# Patient Record
Sex: Female | Born: 1945 | Race: White | Hispanic: No | Smoking: Former smoker
Health system: Southern US, Community
[De-identification: ages and names within clinical notes are randomized; demographics above are authoritative.]

## PROBLEM LIST (undated history)

## (undated) DIAGNOSIS — I313 Pericardial effusion (noninflammatory): Secondary | ICD-10-CM

## (undated) DIAGNOSIS — D649 Anemia, unspecified: Secondary | ICD-10-CM

## (undated) DIAGNOSIS — N183 Chronic kidney disease, stage 3 unspecified: Secondary | ICD-10-CM

## (undated) DIAGNOSIS — I2781 Cor pulmonale (chronic): Secondary | ICD-10-CM

## (undated) DIAGNOSIS — F419 Anxiety disorder, unspecified: Secondary | ICD-10-CM

## (undated) DIAGNOSIS — I2510 Atherosclerotic heart disease of native coronary artery without angina pectoris: Secondary | ICD-10-CM

## (undated) DIAGNOSIS — T8859XA Other complications of anesthesia, initial encounter: Secondary | ICD-10-CM

## (undated) DIAGNOSIS — I5032 Chronic diastolic (congestive) heart failure: Secondary | ICD-10-CM

## (undated) DIAGNOSIS — N390 Urinary tract infection, site not specified: Secondary | ICD-10-CM

## (undated) DIAGNOSIS — Z889 Allergy status to unspecified drugs, medicaments and biological substances status: Secondary | ICD-10-CM

## (undated) DIAGNOSIS — T4145XA Adverse effect of unspecified anesthetic, initial encounter: Secondary | ICD-10-CM

## (undated) DIAGNOSIS — F329 Major depressive disorder, single episode, unspecified: Secondary | ICD-10-CM

## (undated) DIAGNOSIS — E7800 Pure hypercholesterolemia, unspecified: Secondary | ICD-10-CM

## (undated) DIAGNOSIS — F32A Depression, unspecified: Secondary | ICD-10-CM

## (undated) DIAGNOSIS — I272 Pulmonary hypertension, unspecified: Secondary | ICD-10-CM

## (undated) DIAGNOSIS — C3490 Malignant neoplasm of unspecified part of unspecified bronchus or lung: Secondary | ICD-10-CM

## (undated) DIAGNOSIS — I10 Essential (primary) hypertension: Secondary | ICD-10-CM

## (undated) DIAGNOSIS — T148XXA Other injury of unspecified body region, initial encounter: Secondary | ICD-10-CM

## (undated) DIAGNOSIS — Z9981 Dependence on supplemental oxygen: Secondary | ICD-10-CM

## (undated) DIAGNOSIS — J189 Pneumonia, unspecified organism: Secondary | ICD-10-CM

## (undated) DIAGNOSIS — J9610 Chronic respiratory failure, unspecified whether with hypoxia or hypercapnia: Secondary | ICD-10-CM

## (undated) DIAGNOSIS — I3139 Other pericardial effusion (noninflammatory): Secondary | ICD-10-CM

## (undated) DIAGNOSIS — M1990 Unspecified osteoarthritis, unspecified site: Secondary | ICD-10-CM

## (undated) DIAGNOSIS — J449 Chronic obstructive pulmonary disease, unspecified: Secondary | ICD-10-CM

## (undated) HISTORY — PX: CARDIAC CATHETERIZATION: SHX172

## (undated) HISTORY — DX: Chronic respiratory failure, unspecified whether with hypoxia or hypercapnia: J96.10

## (undated) HISTORY — DX: Other pericardial effusion (noninflammatory): I31.39

## (undated) HISTORY — DX: Chronic obstructive pulmonary disease, unspecified: J44.9

## (undated) HISTORY — PX: EYE SURGERY: SHX253

## (undated) HISTORY — PX: TONSILLECTOMY: SUR1361

## (undated) HISTORY — DX: Cor pulmonale (chronic): I27.81

## (undated) HISTORY — DX: Chronic kidney disease, stage 3 (moderate): N18.3

## (undated) HISTORY — DX: Pericardial effusion (noninflammatory): I31.3

## (undated) HISTORY — DX: Chronic diastolic (congestive) heart failure: I50.32

## (undated) HISTORY — DX: Pure hypercholesterolemia, unspecified: E78.00

## (undated) HISTORY — DX: Chronic kidney disease, stage 3 unspecified: N18.30

---
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
## 2006-11-06 ENCOUNTER — Inpatient Hospital Stay (HOSPITAL_COMMUNITY): Admission: EM | Admit: 2006-11-06 | Discharge: 2006-11-16 | Payer: Self-pay | Admitting: Emergency Medicine

## 2006-11-06 ENCOUNTER — Ambulatory Visit: Payer: Self-pay | Admitting: Emergency Medicine

## 2006-11-10 ENCOUNTER — Encounter (INDEPENDENT_AMBULATORY_CARE_PROVIDER_SITE_OTHER): Payer: Self-pay | Admitting: Specialist

## 2006-11-11 ENCOUNTER — Ambulatory Visit: Payer: Self-pay | Admitting: Cardiology

## 2006-11-11 ENCOUNTER — Encounter: Payer: Self-pay | Admitting: Cardiology

## 2008-07-05 ENCOUNTER — Ambulatory Visit: Payer: Self-pay | Admitting: Thoracic Surgery

## 2008-07-11 ENCOUNTER — Ambulatory Visit: Admission: RE | Admit: 2008-07-11 | Discharge: 2008-07-11 | Payer: Self-pay | Admitting: Thoracic Surgery

## 2008-07-12 ENCOUNTER — Ambulatory Visit: Payer: Self-pay | Admitting: Thoracic Surgery

## 2008-07-14 HISTORY — PX: THORACOTOMY: SUR1349

## 2008-07-20 ENCOUNTER — Ambulatory Visit: Payer: Self-pay | Admitting: Thoracic Surgery

## 2008-07-20 ENCOUNTER — Inpatient Hospital Stay (HOSPITAL_COMMUNITY): Admission: RE | Admit: 2008-07-20 | Discharge: 2008-07-24 | Payer: Self-pay | Admitting: Thoracic Surgery

## 2008-07-20 ENCOUNTER — Encounter: Payer: Self-pay | Admitting: Thoracic Surgery

## 2008-08-02 ENCOUNTER — Ambulatory Visit: Payer: Self-pay | Admitting: Thoracic Surgery

## 2008-08-02 ENCOUNTER — Encounter: Admission: RE | Admit: 2008-08-02 | Discharge: 2008-08-02 | Payer: Self-pay | Admitting: Thoracic Surgery

## 2008-08-03 ENCOUNTER — Ambulatory Visit: Payer: Self-pay | Admitting: Internal Medicine

## 2008-08-09 ENCOUNTER — Encounter: Admission: RE | Admit: 2008-08-09 | Discharge: 2008-08-09 | Payer: Self-pay | Admitting: Endocrinology

## 2008-08-15 ENCOUNTER — Ambulatory Visit: Payer: Self-pay | Admitting: Thoracic Surgery

## 2008-08-15 ENCOUNTER — Encounter: Admission: RE | Admit: 2008-08-15 | Discharge: 2008-08-15 | Payer: Self-pay | Admitting: Thoracic Surgery

## 2008-08-15 LAB — CBC WITH DIFFERENTIAL/PLATELET
BASO%: 0.3 % (ref 0.0–2.0)
EOS%: 3 % (ref 0.0–7.0)
LYMPH%: 19.7 % (ref 14.0–48.0)
MCH: 29.1 pg (ref 26.0–34.0)
MCHC: 34 g/dL (ref 32.0–36.0)
MONO#: 0.7 10*3/uL (ref 0.1–0.9)
RBC: 4.72 10*6/uL (ref 3.70–5.32)
WBC: 7.5 10*3/uL (ref 3.9–10.0)
lymph#: 1.5 10*3/uL (ref 0.9–3.3)

## 2008-08-15 LAB — COMPREHENSIVE METABOLIC PANEL
ALT: 11 U/L (ref 0–35)
AST: 12 U/L (ref 0–37)
CO2: 25 mEq/L (ref 19–32)
Creatinine, Ser: 0.63 mg/dL (ref 0.40–1.20)
Sodium: 141 mEq/L (ref 135–145)
Total Bilirubin: 0.3 mg/dL (ref 0.3–1.2)
Total Protein: 7.5 g/dL (ref 6.0–8.3)

## 2008-08-21 LAB — CBC WITH DIFFERENTIAL/PLATELET
BASO%: 0.3 % (ref 0.0–2.0)
EOS%: 3.1 % (ref 0.0–7.0)
HCT: 38.6 % (ref 34.8–46.6)
LYMPH%: 16.9 % (ref 14.0–48.0)
MCH: 29 pg (ref 26.0–34.0)
MCHC: 34.1 g/dL (ref 32.0–36.0)
MCV: 84.8 fL (ref 81.0–101.0)
MONO#: 0.4 10*3/uL (ref 0.1–0.9)
MONO%: 4.4 % (ref 0.0–13.0)
NEUT%: 75.3 % (ref 39.6–76.8)
Platelets: 234 10*3/uL (ref 145–400)
RBC: 4.55 10*6/uL (ref 3.70–5.32)
WBC: 8.2 10*3/uL (ref 3.9–10.0)

## 2008-08-21 LAB — COMPREHENSIVE METABOLIC PANEL
ALT: 15 U/L (ref 0–35)
AST: 13 U/L (ref 0–37)
Alkaline Phosphatase: 97 U/L (ref 39–117)
CO2: 22 mEq/L (ref 19–32)
Creatinine, Ser: 0.64 mg/dL (ref 0.40–1.20)
Sodium: 139 mEq/L (ref 135–145)
Total Bilirubin: 0.3 mg/dL (ref 0.3–1.2)
Total Protein: 7.3 g/dL (ref 6.0–8.3)

## 2008-08-28 LAB — COMPREHENSIVE METABOLIC PANEL
ALT: 23 U/L (ref 0–35)
AST: 22 U/L (ref 0–37)
Albumin: 3.9 g/dL (ref 3.5–5.2)
CO2: 27 mEq/L (ref 19–32)
Calcium: 9.6 mg/dL (ref 8.4–10.5)
Chloride: 102 mEq/L (ref 96–112)
Potassium: 3.6 mEq/L (ref 3.5–5.3)
Sodium: 140 mEq/L (ref 135–145)
Total Protein: 7.1 g/dL (ref 6.0–8.3)

## 2008-08-28 LAB — CBC WITH DIFFERENTIAL/PLATELET
BASO%: 0.2 % (ref 0.0–2.0)
HCT: 37 % (ref 34.8–46.6)
LYMPH%: 15 % (ref 14.0–48.0)
MCHC: 34 g/dL (ref 32.0–36.0)
MCV: 85.4 fL (ref 81.0–101.0)
MONO#: 0.2 10*3/uL (ref 0.1–0.9)
MONO%: 1.8 % (ref 0.0–13.0)
NEUT%: 81.4 % — ABNORMAL HIGH (ref 39.6–76.8)
Platelets: 237 10*3/uL (ref 145–400)
RBC: 4.34 10*6/uL (ref 3.70–5.32)
WBC: 10.3 10*3/uL — ABNORMAL HIGH (ref 3.9–10.0)

## 2008-09-04 LAB — CBC WITH DIFFERENTIAL/PLATELET
BASO%: 0.1 % (ref 0.0–2.0)
EOS%: 0.5 % (ref 0.0–7.0)
MCH: 29 pg (ref 25.1–34.0)
MCHC: 34.5 g/dL (ref 31.5–36.0)
MONO#: 0.4 10*3/uL (ref 0.1–0.9)
NEUT%: 84.4 % — ABNORMAL HIGH (ref 38.4–76.8)
RBC: 4.14 10*6/uL (ref 3.70–5.45)
WBC: 18.8 10*3/uL — ABNORMAL HIGH (ref 3.9–10.3)
lymph#: 2.4 10*3/uL (ref 0.9–3.3)

## 2008-09-04 LAB — COMPREHENSIVE METABOLIC PANEL
ALT: 17 U/L (ref 0–35)
AST: 16 U/L (ref 0–37)
CO2: 24 mEq/L (ref 19–32)
Chloride: 104 mEq/L (ref 96–112)
Creatinine, Ser: 0.62 mg/dL (ref 0.40–1.20)
Sodium: 141 mEq/L (ref 135–145)
Total Bilirubin: 0.2 mg/dL — ABNORMAL LOW (ref 0.3–1.2)
Total Protein: 6.9 g/dL (ref 6.0–8.3)

## 2008-09-11 LAB — CBC WITH DIFFERENTIAL/PLATELET
BASO%: 0.2 % (ref 0.0–2.0)
EOS%: 0.2 % (ref 0.0–7.0)
MCH: 27.9 pg (ref 25.1–34.0)
MCHC: 33.6 g/dL (ref 31.5–36.0)
MONO#: 0.9 10*3/uL (ref 0.1–0.9)
RDW: 14.8 % — ABNORMAL HIGH (ref 11.2–14.5)
WBC: 15 10*3/uL — ABNORMAL HIGH (ref 3.9–10.3)
lymph#: 1.7 10*3/uL (ref 0.9–3.3)
nRBC: 0 % (ref 0–0)

## 2008-09-11 LAB — COMPREHENSIVE METABOLIC PANEL
ALT: 16 U/L (ref 0–35)
AST: 15 U/L (ref 0–37)
Albumin: 4.6 g/dL (ref 3.5–5.2)
Alkaline Phosphatase: 120 U/L — ABNORMAL HIGH (ref 39–117)
BUN: 23 mg/dL (ref 6–23)
Chloride: 105 mEq/L (ref 96–112)
Potassium: 4.1 mEq/L (ref 3.5–5.3)
Sodium: 141 mEq/L (ref 135–145)
Total Protein: 7.2 g/dL (ref 6.0–8.3)

## 2008-09-18 ENCOUNTER — Ambulatory Visit: Payer: Self-pay | Admitting: Internal Medicine

## 2008-09-18 LAB — CBC WITH DIFFERENTIAL/PLATELET
BASO%: 0.4 % (ref 0.0–2.0)
Basophils Absolute: 0.1 10*3/uL (ref 0.0–0.1)
EOS%: 0.2 % (ref 0.0–7.0)
HGB: 11.2 g/dL — ABNORMAL LOW (ref 11.6–15.9)
MCH: 28.1 pg (ref 25.1–34.0)
MCHC: 33.6 g/dL (ref 31.5–36.0)
MCV: 83.5 fL (ref 79.5–101.0)
MONO%: 1.9 % (ref 0.0–14.0)
RDW: 14.9 % — ABNORMAL HIGH (ref 11.2–14.5)

## 2008-09-18 LAB — COMPREHENSIVE METABOLIC PANEL
Alkaline Phosphatase: 136 U/L — ABNORMAL HIGH (ref 39–117)
BUN: 19 mg/dL (ref 6–23)
Glucose, Bld: 140 mg/dL — ABNORMAL HIGH (ref 70–99)
Sodium: 139 mEq/L (ref 135–145)
Total Bilirubin: 0.3 mg/dL (ref 0.3–1.2)
Total Protein: 6.5 g/dL (ref 6.0–8.3)

## 2008-09-25 LAB — COMPREHENSIVE METABOLIC PANEL
CO2: 25 mEq/L (ref 19–32)
Creatinine, Ser: 0.53 mg/dL (ref 0.40–1.20)
Glucose, Bld: 117 mg/dL — ABNORMAL HIGH (ref 70–99)
Total Bilirubin: 0.3 mg/dL (ref 0.3–1.2)

## 2008-09-25 LAB — CBC WITH DIFFERENTIAL/PLATELET
Basophils Absolute: 0.2 10*3/uL — ABNORMAL HIGH (ref 0.0–0.1)
Eosinophils Absolute: 0.1 10*3/uL (ref 0.0–0.5)
HGB: 11.5 g/dL — ABNORMAL LOW (ref 11.6–15.9)
LYMPH%: 13.4 % — ABNORMAL LOW (ref 14.0–49.7)
MCV: 83.4 fL (ref 79.5–101.0)
MONO%: 7.5 % (ref 0.0–14.0)
NEUT#: 18.2 10*3/uL — ABNORMAL HIGH (ref 1.5–6.5)
Platelets: 97 10*3/uL — ABNORMAL LOW (ref 145–400)

## 2008-09-27 ENCOUNTER — Ambulatory Visit: Payer: Self-pay | Admitting: Thoracic Surgery

## 2008-09-27 ENCOUNTER — Encounter: Admission: RE | Admit: 2008-09-27 | Discharge: 2008-09-27 | Payer: Self-pay | Admitting: Thoracic Surgery

## 2008-10-02 LAB — COMPREHENSIVE METABOLIC PANEL
Albumin: 4.2 g/dL (ref 3.5–5.2)
CO2: 25 mEq/L (ref 19–32)
Calcium: 9.5 mg/dL (ref 8.4–10.5)
Glucose, Bld: 156 mg/dL — ABNORMAL HIGH (ref 70–99)
Sodium: 141 mEq/L (ref 135–145)
Total Bilirubin: 0.3 mg/dL (ref 0.3–1.2)
Total Protein: 6.9 g/dL (ref 6.0–8.3)

## 2008-10-02 LAB — CBC WITH DIFFERENTIAL/PLATELET
Eosinophils Absolute: 0.1 10*3/uL (ref 0.0–0.5)
HCT: 33 % — ABNORMAL LOW (ref 34.8–46.6)
LYMPH%: 13.9 % — ABNORMAL LOW (ref 14.0–49.7)
MONO#: 0.7 10*3/uL (ref 0.1–0.9)
NEUT#: 10.3 10*3/uL — ABNORMAL HIGH (ref 1.5–6.5)
Platelets: 298 10*3/uL (ref 145–400)
RBC: 3.88 10*6/uL (ref 3.70–5.45)
WBC: 13 10*3/uL — ABNORMAL HIGH (ref 3.9–10.3)

## 2008-10-09 LAB — COMPREHENSIVE METABOLIC PANEL
ALT: 15 U/L (ref 0–35)
AST: 13 U/L (ref 0–37)
CO2: 28 mEq/L (ref 19–32)
Calcium: 9.6 mg/dL (ref 8.4–10.5)
Chloride: 101 mEq/L (ref 96–112)
Potassium: 4.1 mEq/L (ref 3.5–5.3)
Sodium: 139 mEq/L (ref 135–145)
Total Protein: 6.6 g/dL (ref 6.0–8.3)

## 2008-10-09 LAB — CBC WITH DIFFERENTIAL/PLATELET
Eosinophils Absolute: 0.1 10*3/uL (ref 0.0–0.5)
MONO#: 0.3 10*3/uL (ref 0.1–0.9)
NEUT#: 22.4 10*3/uL — ABNORMAL HIGH (ref 1.5–6.5)
Platelets: 285 10*3/uL (ref 145–400)
RBC: 3.52 10*6/uL — ABNORMAL LOW (ref 3.70–5.45)
RDW: 17 % — ABNORMAL HIGH (ref 11.2–14.5)
WBC: 24.6 10*3/uL — ABNORMAL HIGH (ref 3.9–10.3)
lymph#: 1.9 10*3/uL (ref 0.9–3.3)
nRBC: 0 % (ref 0–0)

## 2008-10-17 LAB — COMPREHENSIVE METABOLIC PANEL
AST: 18 U/L (ref 0–37)
Albumin: 4.2 g/dL (ref 3.5–5.2)
BUN: 12 mg/dL (ref 6–23)
Calcium: 9.2 mg/dL (ref 8.4–10.5)
Chloride: 105 mEq/L (ref 96–112)
Creatinine, Ser: 0.58 mg/dL (ref 0.40–1.20)
Glucose, Bld: 88 mg/dL (ref 70–99)

## 2008-10-17 LAB — CBC WITH DIFFERENTIAL/PLATELET
Basophils Absolute: 0 10*3/uL (ref 0.0–0.1)
EOS%: 0.5 % (ref 0.0–7.0)
Eosinophils Absolute: 0.1 10*3/uL (ref 0.0–0.5)
HCT: 29.3 % — ABNORMAL LOW (ref 34.8–46.6)
HGB: 10.1 g/dL — ABNORMAL LOW (ref 11.6–15.9)
MCH: 30 pg (ref 25.1–34.0)
MCV: 87.2 fL (ref 79.5–101.0)
MONO%: 3.9 % (ref 0.0–14.0)
NEUT#: 12.1 10*3/uL — ABNORMAL HIGH (ref 1.5–6.5)
NEUT%: 81.5 % — ABNORMAL HIGH (ref 38.4–76.8)
lymph#: 2 10*3/uL (ref 0.9–3.3)

## 2008-10-23 LAB — COMPREHENSIVE METABOLIC PANEL
Albumin: 4.1 g/dL (ref 3.5–5.2)
BUN: 19 mg/dL (ref 6–23)
CO2: 24 mEq/L (ref 19–32)
Calcium: 10 mg/dL (ref 8.4–10.5)
Chloride: 103 mEq/L (ref 96–112)
Creatinine, Ser: 0.52 mg/dL (ref 0.40–1.20)
Glucose, Bld: 91 mg/dL (ref 70–99)
Potassium: 4.1 mEq/L (ref 3.5–5.3)

## 2008-10-23 LAB — CBC WITH DIFFERENTIAL/PLATELET
Basophils Absolute: 0 10*3/uL (ref 0.0–0.1)
Eosinophils Absolute: 0.1 10*3/uL (ref 0.0–0.5)
HCT: 32.4 % — ABNORMAL LOW (ref 34.8–46.6)
HGB: 11.1 g/dL — ABNORMAL LOW (ref 11.6–15.9)
MCH: 29.4 pg (ref 25.1–34.0)
MONO#: 0.8 10*3/uL (ref 0.1–0.9)
NEUT#: 8.2 10*3/uL — ABNORMAL HIGH (ref 1.5–6.5)
NEUT%: 76.5 % (ref 38.4–76.8)
RDW: 19.5 % — ABNORMAL HIGH (ref 11.2–14.5)
WBC: 10.7 10*3/uL — ABNORMAL HIGH (ref 3.9–10.3)
lymph#: 1.6 10*3/uL (ref 0.9–3.3)

## 2008-10-26 ENCOUNTER — Ambulatory Visit: Payer: Self-pay | Admitting: Internal Medicine

## 2008-10-30 LAB — COMPREHENSIVE METABOLIC PANEL
ALT: 13 U/L (ref 0–35)
AST: 13 U/L (ref 0–37)
Albumin: 4.3 g/dL (ref 3.5–5.2)
BUN: 17 mg/dL (ref 6–23)
CO2: 26 mEq/L (ref 19–32)
Calcium: 9.7 mg/dL (ref 8.4–10.5)
Chloride: 103 mEq/L (ref 96–112)
Creatinine, Ser: 0.59 mg/dL (ref 0.40–1.20)
Potassium: 3.9 mEq/L (ref 3.5–5.3)

## 2008-10-30 LAB — CBC WITH DIFFERENTIAL/PLATELET
BASO%: 0 % (ref 0.0–2.0)
Basophils Absolute: 0 10*3/uL (ref 0.0–0.1)
EOS%: 0.3 % (ref 0.0–7.0)
HCT: 30.8 % — ABNORMAL LOW (ref 34.8–46.6)
HGB: 10.5 g/dL — ABNORMAL LOW (ref 11.6–15.9)
MCH: 29.9 pg (ref 25.1–34.0)
MONO#: 0.2 10*3/uL (ref 0.1–0.9)
NEUT%: 91.4 % — ABNORMAL HIGH (ref 38.4–76.8)
RDW: 20.5 % — ABNORMAL HIGH (ref 11.2–14.5)
WBC: 19.5 10*3/uL — ABNORMAL HIGH (ref 3.9–10.3)
lymph#: 1.5 10*3/uL (ref 0.9–3.3)

## 2008-11-08 ENCOUNTER — Ambulatory Visit (HOSPITAL_COMMUNITY): Admission: RE | Admit: 2008-11-08 | Discharge: 2008-11-08 | Payer: Self-pay | Admitting: Internal Medicine

## 2008-11-22 ENCOUNTER — Ambulatory Visit: Payer: Self-pay | Admitting: Thoracic Surgery

## 2009-02-06 ENCOUNTER — Ambulatory Visit: Payer: Self-pay | Admitting: Internal Medicine

## 2009-02-09 ENCOUNTER — Ambulatory Visit (HOSPITAL_COMMUNITY): Admission: RE | Admit: 2009-02-09 | Discharge: 2009-02-09 | Payer: Self-pay | Admitting: Internal Medicine

## 2009-02-09 LAB — COMPREHENSIVE METABOLIC PANEL
AST: 46 U/L — ABNORMAL HIGH (ref 0–37)
Alkaline Phosphatase: 119 U/L — ABNORMAL HIGH (ref 39–117)
BUN: 13 mg/dL (ref 6–23)
Creatinine, Ser: 0.61 mg/dL (ref 0.40–1.20)

## 2009-02-09 LAB — CBC WITH DIFFERENTIAL/PLATELET
Basophils Absolute: 0 10*3/uL (ref 0.0–0.1)
EOS%: 1.8 % (ref 0.0–7.0)
HCT: 40.3 % (ref 34.8–46.6)
HGB: 13.9 g/dL (ref 11.6–15.9)
LYMPH%: 20.6 % (ref 14.0–49.7)
MCH: 29.8 pg (ref 25.1–34.0)
MCV: 86.6 fL (ref 79.5–101.0)
MONO%: 7.6 % (ref 0.0–14.0)
NEUT%: 69.9 % (ref 38.4–76.8)
Platelets: 235 10*3/uL (ref 145–400)

## 2009-05-09 ENCOUNTER — Ambulatory Visit: Payer: Self-pay | Admitting: Internal Medicine

## 2009-05-14 ENCOUNTER — Ambulatory Visit (HOSPITAL_COMMUNITY): Admission: RE | Admit: 2009-05-14 | Discharge: 2009-05-14 | Payer: Self-pay | Admitting: Internal Medicine

## 2009-05-14 LAB — COMPREHENSIVE METABOLIC PANEL
AST: 29 U/L (ref 0–37)
Albumin: 3.8 g/dL (ref 3.5–5.2)
Alkaline Phosphatase: 107 U/L (ref 39–117)
BUN: 9 mg/dL (ref 6–23)
Creatinine, Ser: 0.59 mg/dL (ref 0.40–1.20)
Potassium: 4.1 mEq/L (ref 3.5–5.3)

## 2009-05-14 LAB — CBC WITH DIFFERENTIAL/PLATELET
Basophils Absolute: 0 10*3/uL (ref 0.0–0.1)
EOS%: 2.3 % (ref 0.0–7.0)
HGB: 14 g/dL (ref 11.6–15.9)
MCH: 30.3 pg (ref 25.1–34.0)
MCV: 88.4 fL (ref 79.5–101.0)
MONO%: 8.8 % (ref 0.0–14.0)
RBC: 4.63 10*6/uL (ref 3.70–5.45)
RDW: 14.7 % — ABNORMAL HIGH (ref 11.2–14.5)

## 2009-05-23 ENCOUNTER — Ambulatory Visit: Payer: Self-pay | Admitting: Thoracic Surgery

## 2009-07-16 ENCOUNTER — Other Ambulatory Visit: Admission: RE | Admit: 2009-07-16 | Discharge: 2009-07-16 | Payer: Self-pay | Admitting: Family Medicine

## 2009-08-10 ENCOUNTER — Ambulatory Visit: Payer: Self-pay | Admitting: Internal Medicine

## 2009-08-14 ENCOUNTER — Ambulatory Visit (HOSPITAL_COMMUNITY): Admission: RE | Admit: 2009-08-14 | Discharge: 2009-08-14 | Payer: Self-pay | Admitting: Internal Medicine

## 2009-08-14 LAB — CBC WITH DIFFERENTIAL/PLATELET
BASO%: 0.4 % (ref 0.0–2.0)
Basophils Absolute: 0 10*3/uL (ref 0.0–0.1)
EOS%: 2 % (ref 0.0–7.0)
MCH: 30.4 pg (ref 25.1–34.0)
MCHC: 34.5 g/dL (ref 31.5–36.0)
MCV: 88 fL (ref 79.5–101.0)
MONO%: 8.5 % (ref 0.0–14.0)
RBC: 4.58 10*6/uL (ref 3.70–5.45)
RDW: 14.6 % — ABNORMAL HIGH (ref 11.2–14.5)

## 2009-08-14 LAB — COMPREHENSIVE METABOLIC PANEL
ALT: 31 U/L (ref 0–35)
AST: 26 U/L (ref 0–37)
Albumin: 3.8 g/dL (ref 3.5–5.2)
Alkaline Phosphatase: 110 U/L (ref 39–117)
BUN: 16 mg/dL (ref 6–23)
Potassium: 4.1 mEq/L (ref 3.5–5.3)

## 2009-08-17 ENCOUNTER — Ambulatory Visit: Payer: Self-pay | Admitting: Thoracic Surgery

## 2009-11-09 ENCOUNTER — Ambulatory Visit (HOSPITAL_COMMUNITY): Admission: RE | Admit: 2009-11-09 | Discharge: 2009-11-09 | Payer: Self-pay | Admitting: Internal Medicine

## 2009-11-09 ENCOUNTER — Ambulatory Visit: Payer: Self-pay | Admitting: Internal Medicine

## 2009-11-09 LAB — CBC WITH DIFFERENTIAL/PLATELET
EOS%: 1.2 % (ref 0.0–7.0)
MCH: 30.4 pg (ref 25.1–34.0)
MCV: 88.3 fL (ref 79.5–101.0)
MONO%: 7.7 % (ref 0.0–14.0)
NEUT#: 4.8 10*3/uL (ref 1.5–6.5)
RBC: 4.76 10*6/uL (ref 3.70–5.45)
RDW: 13.8 % (ref 11.2–14.5)
lymph#: 1.2 10*3/uL (ref 0.9–3.3)

## 2009-11-09 LAB — COMPREHENSIVE METABOLIC PANEL
ALT: 27 U/L (ref 0–35)
AST: 22 U/L (ref 0–37)
Albumin: 3.7 g/dL (ref 3.5–5.2)
Alkaline Phosphatase: 101 U/L (ref 39–117)
Calcium: 9.4 mg/dL (ref 8.4–10.5)
Chloride: 103 mEq/L (ref 96–112)
Potassium: 4.2 mEq/L (ref 3.5–5.3)
Sodium: 137 mEq/L (ref 135–145)
Total Protein: 7 g/dL (ref 6.0–8.3)

## 2009-11-16 ENCOUNTER — Ambulatory Visit: Payer: Self-pay | Admitting: Thoracic Surgery

## 2010-03-13 ENCOUNTER — Ambulatory Visit: Payer: Self-pay | Admitting: Internal Medicine

## 2010-03-15 ENCOUNTER — Ambulatory Visit (HOSPITAL_COMMUNITY): Admission: RE | Admit: 2010-03-15 | Discharge: 2010-03-15 | Payer: Self-pay | Admitting: Internal Medicine

## 2010-03-15 LAB — CBC WITH DIFFERENTIAL/PLATELET
Basophils Absolute: 0 10*3/uL (ref 0.0–0.1)
Eosinophils Absolute: 0.1 10*3/uL (ref 0.0–0.5)
HGB: 14.7 g/dL (ref 11.6–15.9)
MCV: 88.6 fL (ref 79.5–101.0)
MONO%: 10 % (ref 0.0–14.0)
NEUT#: 4.7 10*3/uL (ref 1.5–6.5)
RBC: 4.9 10*6/uL (ref 3.70–5.45)
RDW: 13.9 % (ref 11.2–14.5)
WBC: 6.8 10*3/uL (ref 3.9–10.3)
lymph#: 1.3 10*3/uL (ref 0.9–3.3)

## 2010-03-15 LAB — COMPREHENSIVE METABOLIC PANEL
AST: 41 U/L — ABNORMAL HIGH (ref 0–37)
Albumin: 3.9 g/dL (ref 3.5–5.2)
Alkaline Phosphatase: 108 U/L (ref 39–117)
BUN: 12 mg/dL (ref 6–23)
Calcium: 9.5 mg/dL (ref 8.4–10.5)
Chloride: 100 mEq/L (ref 96–112)
Glucose, Bld: 314 mg/dL — ABNORMAL HIGH (ref 70–99)
Potassium: 4.5 mEq/L (ref 3.5–5.3)
Sodium: 136 mEq/L (ref 135–145)
Total Protein: 7.3 g/dL (ref 6.0–8.3)

## 2010-07-05 ENCOUNTER — Ambulatory Visit: Payer: Self-pay | Admitting: Internal Medicine

## 2010-07-10 ENCOUNTER — Ambulatory Visit (HOSPITAL_COMMUNITY)
Admission: RE | Admit: 2010-07-10 | Discharge: 2010-07-10 | Payer: Self-pay | Source: Home / Self Care | Attending: Internal Medicine | Admitting: Internal Medicine

## 2010-07-10 LAB — CBC WITH DIFFERENTIAL/PLATELET
Basophils Absolute: 0 10*3/uL (ref 0.0–0.1)
EOS%: 3 % (ref 0.0–7.0)
HCT: 41.2 % (ref 34.8–46.6)
HGB: 14.3 g/dL (ref 11.6–15.9)
MCH: 31 pg (ref 25.1–34.0)
MCV: 89.2 fL (ref 79.5–101.0)
MONO%: 8.6 % (ref 0.0–14.0)
NEUT%: 64.3 % (ref 38.4–76.8)
RDW: 13.5 % (ref 11.2–14.5)

## 2010-07-10 LAB — CMP (CANCER CENTER ONLY)
AST: 22 U/L (ref 11–38)
Alkaline Phosphatase: 96 U/L — ABNORMAL HIGH (ref 26–84)
BUN, Bld: 15 mg/dL (ref 7–22)
Creat: 0.7 mg/dl (ref 0.6–1.2)

## 2010-08-03 ENCOUNTER — Other Ambulatory Visit: Payer: Self-pay | Admitting: Internal Medicine

## 2010-08-03 DIAGNOSIS — Z85118 Personal history of other malignant neoplasm of bronchus and lung: Secondary | ICD-10-CM

## 2010-08-05 ENCOUNTER — Encounter: Payer: Self-pay | Admitting: Thoracic Surgery

## 2010-10-28 LAB — BASIC METABOLIC PANEL
CO2: 27 mEq/L (ref 19–32)
CO2: 27 mEq/L (ref 19–32)
Calcium: 9.1 mg/dL (ref 8.4–10.5)
Chloride: 109 mEq/L (ref 96–112)
GFR calc Af Amer: >60 mL/min (ref >60)
GFR calc Af Amer: >60 mL/min (ref >60)
GFR calc non Af Amer: >60 mL/min (ref >60)
Glucose, Bld: 159 mg/dL — ABNORMAL HIGH (ref 70–99)
Potassium: 3.5 mEq/L (ref 3.5–5.1)
Sodium: 136 mEq/L (ref 135–145)
Sodium: 141 mEq/L (ref 135–145)

## 2010-10-28 LAB — URINALYSIS, ROUTINE W REFLEX MICROSCOPIC
Bilirubin Urine: NEGATIVE
Glucose, UA: NEGATIVE mg/dL
Hgb urine dipstick: NEGATIVE
Ketones, ur: 15 mg/dL — AB
Nitrite: NEGATIVE
pH: 6 (ref 5.0–8.0)

## 2010-10-28 LAB — COMPREHENSIVE METABOLIC PANEL
AST: 22 U/L (ref 0–37)
AST: 24 U/L (ref 0–37)
Alkaline Phosphatase: 95 U/L (ref 39–117)
BUN: 9 mg/dL (ref 6–23)
CO2: 22 mEq/L (ref 19–32)
CO2: 27 mEq/L (ref 19–32)
Chloride: 104 mEq/L (ref 96–112)
Chloride: 107 mEq/L (ref 96–112)
Creatinine, Ser: 0.54 mg/dL (ref 0.4–1.2)
Creatinine, Ser: 0.56 mg/dL (ref 0.4–1.2)
GFR calc Af Amer: >60 mL/min (ref >60)
GFR calc Af Amer: >60 mL/min (ref >60)
GFR calc non Af Amer: >60 mL/min (ref >60)
GFR calc non Af Amer: >60 mL/min (ref >60)
Glucose, Bld: 208 mg/dL — ABNORMAL HIGH (ref 70–99)
Total Bilirubin: 0.6 mg/dL (ref 0.3–1.2)
Total Bilirubin: 1.1 mg/dL (ref 0.3–1.2)

## 2010-10-28 LAB — GLUCOSE, CAPILLARY
Glucose-Capillary: 152 mg/dL — ABNORMAL HIGH (ref 70–99)
Glucose-Capillary: 171 mg/dL — ABNORMAL HIGH (ref 70–99)
Glucose-Capillary: 202 mg/dL — ABNORMAL HIGH (ref 70–99)
Glucose-Capillary: 204 mg/dL — ABNORMAL HIGH (ref 70–99)
Glucose-Capillary: 262 mg/dL — ABNORMAL HIGH (ref 70–99)

## 2010-10-28 LAB — CBC
HCT: 32.1 % — ABNORMAL LOW (ref 36.0–46.0)
HCT: 33.7 % — ABNORMAL LOW (ref 36.0–46.0)
HCT: 42.6 % (ref 36.0–46.0)
Hemoglobin: 10.7 g/dL — ABNORMAL LOW (ref 12.0–15.0)
Hemoglobin: 11.4 g/dL — ABNORMAL LOW (ref 12.0–15.0)
Hemoglobin: 11.5 g/dL — ABNORMAL LOW (ref 12.0–15.0)
MCHC: 33.9 g/dL (ref 30.0–36.0)
MCV: 86.9 fL (ref 78.0–100.0)
MCV: 87.6 fL (ref 78.0–100.0)
MCV: 88 fL (ref 78.0–100.0)
RBC: 3.64 MIL/uL — ABNORMAL LOW (ref 3.87–5.11)
RBC: 3.84 MIL/uL — ABNORMAL LOW (ref 3.87–5.11)
RBC: 3.89 MIL/uL (ref 3.87–5.11)
RBC: 4.9 MIL/uL (ref 3.87–5.11)
WBC: 7 10*3/uL (ref 4.0–10.5)
WBC: 9.4 10*3/uL (ref 4.0–10.5)

## 2010-10-28 LAB — TYPE AND SCREEN
ABO/RH(D): B NEG
Antibody Screen: NEGATIVE

## 2010-10-28 LAB — URINE MICROSCOPIC-ADD ON

## 2010-10-28 LAB — APTT: aPTT: 27 seconds (ref 24–37)

## 2010-10-28 LAB — POCT I-STAT 3, ART BLOOD GAS (G3+)
Bicarbonate: 27.8 mEq/L — ABNORMAL HIGH (ref 20.0–24.0)
TCO2: 29 mmol/L (ref 0–100)
pCO2 arterial: 48.1 mmHg — ABNORMAL HIGH (ref 35.0–45.0)
pH, Arterial: 7.368 (ref 7.350–7.400)

## 2010-10-28 LAB — BLOOD GAS, ARTERIAL
Acid-base deficit: 0.2 mmol/L (ref 0.0–2.0)
TCO2: 24.3 mmol/L (ref 0–100)
pCO2 arterial: 33.6 mmHg — ABNORMAL LOW (ref 35.0–45.0)
pO2, Arterial: 87.1 mmHg (ref 80.0–100.0)

## 2010-11-26 NOTE — Letter (Signed)
July 12, 2008   [name], MD  [address]  [place], [place] [address]   Re:  EMBER, HENRIKSON                  DOB:  1945/07/21   Dear Dr. Rosana Hoes:   I saw the patient back today after her CAT scan, brain scan, and  pulmonary function tests.  Her pulmonary function tests were  satisfactory with an FVC of 2.27 with an FEV-1 of 1.87 of 88% predicted  and DLCO of 100% with correction.  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  So, I think she is a good candidate for right lower lobe  superior  segmentectomy using the VATS approach.  We will schedule this for  July 26, 2007, at [facility].  We will let you know our findings.  I appreciate the opportunity of taking care of the patient.   Sincerely.   Nicanor Alcon, M.D.  Electronically Signed   DPB/MEDQ  D:  07/12/2008  T:  07/13/2008  Job:  [id number]

## 2010-11-26 NOTE — Op Note (Signed)
Amy Shepard, Amy Shepard          ACCOUNT NO.:  [id number]   MEDICAL RECORD NO.:  [id number]          PATIENT TYPE:  INP   LOCATION:  2303                         FACILITY:  [facility]   PHYSICIAN:  Nicanor Alcon, M.D. DATE OF BIRTH:  1946/02/20   DATE OF PROCEDURE:  07/20/2008  DATE OF DISCHARGE:                               OPERATIVE REPORT   PREOPERATIVE DIAGNOSIS:  Right lower lobe superior segmental lesion.   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   OPERATIONS PERFORMED:  1. Right video-assisted thoracic surgery, right lower lobe.  2. Superior segmentectomy with node dissection.   SURGEON:  Nicanor Alcon, MD   FIRST ASSISTANT:  John Giovanni, PA-C   After percutaneous insertion of all monitoring lines, the patient turned  to the right lateral thoracotomy position and was prepped and draped in  the usual sterile manner.  Trocar site was made in the anterior axillary  line at the eighth intercostal space and the trocar was inserted and a 0-  degree scope was inserted.  We could see the lesion in the superior  segment of the right lower lobe.  For this reason, a lateral small mini-  thoracotomy incision was made over the sixth intercostal space.  The  latissimus was partially divided.  The serratus was reflected  anteriorly.  The sixth intercostal space was entered.  Small TPA was  placed.  Dissection was started in the fissure dissecting out both the  superior segmental artery, and dissecting this out, there was some  bleeding from this artery such that we had to suture with 4-0 Prolene  proximally and distally.  The dissection was carried superiorly down to  the bronchus.  The bronchus was identified, and then we used a 60 green  Covidien stapler to come across the bronchus as well as resecting the  superior segment.  This was done with several applications.  The  superior segment was sent for frozen section and frozen section  revealed  that either is a small-cell cancer or a possible lymphoma, margins were  negative.  We dissected out some lymph nodes from around the bronchus  intermedius, some 10 and 11R lymph nodes and then posteriorly dissected  out a small 7 lymph node from the subcarinal space.  The fissure was  reapproximated with interrupted 3-0 Vicryls in an interrupted fashion  checking for an air leak and then putting CoSeal on that staple line and  the suture line.  A chest tube was brought anteriorly and sutured in  place with 0 silk.  A Marcaine block was done in the usual fashion.  A  single On-Q was inserted in the usual fashion.  Chest was closed with 3  pericostals drilling through the seventh rib and passed around the sixth  rib, #1 Vicryl in the muscle layer, 2-0 Vicryl in the subcutaneous  tissue, and Ethicon skin clips.  The patient was returned to the  recovery room in stable condition.      Nicanor Alcon, M.D.  Electronically Signed     DPB/MEDQ  D:  07/20/2008  T:  07/21/2008  Job:  [id number]

## 2010-11-26 NOTE — Letter (Signed)
Nov 16, 2009   Mohamed K. Amy Shepard, [place], [place] [address]   Re:  Amy Shepard, GRAYS                  DOB:  Sep 16, 1945   Dear Amy Shepard,   I saw the patient today, and her CT scan showed no evidence of  recurrence now over a year since her surgery.  She had another CT scan  scheduled in the future.  We will see her back again, and I will let you  follow her from now.  She is doing well from my standpoint.  Her blood  pressure is 128/80, pulse 96, respirations 18, sats were 92%.  She has  shortness of breath with exertion. I will let you follow her from now  on. I appreciate your care of this patient.   Nicanor Alcon, M.D.  Electronically Signed   DPB/MEDQ  D:  11/16/2009  T:  11/17/2009  Job:  [id number]

## 2010-11-26 NOTE — H&P (Signed)
NAMECAMISHA, Amy Shepard          ACCOUNT NO.:  [id number]   MEDICAL RECORD NO.:  [id number]          PATIENT TYPE:  INP   LOCATION:                               FACILITY:  [facility]   PHYSICIAN:  Nicanor Alcon, M.D. DATE OF BIRTH:  05-23-1946   DATE OF ADMISSION:  07/20/2008  DATE OF DISCHARGE:                              HISTORY & PHYSICAL   CHIEF COMPLAINT:  Right lung mass.   HISTORY OF PRESENT ILLNESS:  This 65 year old patient was admitted in  2008 with severe pneumonia and respiratory distress, requiring  intubation.  CT scan showed pleuritic pneumonia.  She recently had a  chest x-ray that showed an occasional right lower lobe  mass.  The CT  scan showed a 1 x 1.6 nodule on the superior segment of the right lower  lobe with mild adenopathy.  A PET scan was done, which was positive with  an SUV uptake of 2.3 indicating of possible low-grade cancer and  negative subclavicular or axillary adenopathy.  She works as a Marine scientist for  [employer]  Pulmonary function test showed an FVC of 2.43  and FEV1 of 1.88.  She gets short of breath when she is climbing stairs.  She has had no hemoptysis, fever, chills, or excessive sputum.  Her DLCO  corrected was 115 with uncorrected 83%.   PAST MEDICAL HISTORY:  Significant.  She has type 2 diabetes.   ALLERGIES:  She is allergic to ASPIRIN.   MEDICATIONS:  She takes Metformin 1000 mg twice a day, vitamin D, fish  oil, and aspirin.   FAMILY HISTORY:  Noncontributory.   SOCIAL HISTORY:  She has 2 children and she works as a Marine scientist.  She  smoked half a pack per day for 40 years.  She does not drink alcohol on  a regular basis.   REVIEW OF SYSTEMS:  She is 184 pounds.  She is 5 feet 5 inches.  In  general, weight has been stable.  CARDIAC:  No angina, atrial  fibrillation.  PULMONARY:  No hemoptysis, fever, or chills.  See history  of present illness. GI:  Intermittent diarrhea.  GU:  No kidney disease,  dysuria, frequent  urination.  VASCULAR:  No claudication, DVT, or TIA.  NEUROLOGIC:  No dizziness, headaches, blackouts, or seizures.  MUSCULOSKELETAL:  No arthritis or joint pain.  PSYCHIATRIC:  No  depression or nervous.  EYES/ENT:  No changes in her eyesight or  hearing.  HEMATOLOGICAL:  No problems with clotting disorders, anemia,  or bleeding.   PHYSICAL EXAMINATION:  VITAL SIGNS:  Her blood pressure was 174/94,  pulse 96, respirations 18, temperature 97%.  HEAD, EYES, EARS, NOSE, AND THROAT:  Head atraumatic.  Eyes, pupils  equal and reactive to light and accommodation.  Extraocular movements  normal.  Ears, tympanic membranes were intact.  Nose, there was no  septal deviation.  NECK:  Supple without thyromegaly.  There is no subclavicular or  axillary adenopathy.  CHEST:  Clear to auscultation and percussion.  HEART:  Regular sinus rhythm.  No murmurs.  ABDOMEN:  Soft.  There is no hepatosplenomegaly.  EXTREMITIES:  Pulses are 2+.  There is no clubbing or edema.  NEUROLOGICAL:  She is oriented x3.  Sensation and motor intact.  Cranial  nerves intact.   IMPRESSION:  1. Right lower lobe superior segmental lesion.  2. Chronic obstructive pulmonary disease.  3. History of tobacco use.  4. Diabetes mellitus type 2.   PLAN:  Right lower lobe superior segmentectomy.      Nicanor Alcon, M.D.  Electronically Signed     DPB/MEDQ  D:  07/18/2008  T:  07/19/2008  Job:  [id number]

## 2010-11-26 NOTE — Assessment & Plan Note (Signed)
OFFICE VISIT   Amy Shepard, Amy Shepard  DOB:  1946-06-24                                        May 23, 2009  CHART #:  [id number]   The patient came today and her CT scan showed no evidence of recurrence.  Her blood pressure was 122/80, pulse 98, respirations 18, and sats were  92%.  Lungs were clear to auscultation and percussion.   Nicanor Alcon, M.D.  Electronically Signed   DPB/MEDQ  D:  05/23/2009  T:  05/24/2009  Job:  [id number]

## 2010-11-26 NOTE — Letter (Signed)
August 17, 2009   Fanny Bien. [name], [place], [place] [address]   Re:  Amy Shepard, ITALIANO                  DOB:  02-03-1946   Dear [name],   I saw the patient today and reviewed her CT scan.  I agree with you that  this area in the base of the left lower lobe probably has nothing to be  concerned about.  It is only 6 mm and looks very irregular, and since it  has been seen by the radiologist we will need to repeat the CT scan in  about 3 months, and I will see her back again at that time.  Otherwise,  I thought her CT scan looks great.  Her blood pressure was 170/70, pulse  88, respirations 18, sats were 95%.   Nicanor Alcon, M.D.  Electronically Signed   DPB/MEDQ  D:  08/17/2009  T:  08/18/2009  Job:  [id number]

## 2010-11-26 NOTE — Letter (Signed)
August 15, 2008   Awanda Mink I. Rosana Hoes, [place]  [place], [place] [address]   Re:  SKARLETT, SEDLACEK                  DOB:  1945/10/01   Dear Awanda Mink,   The patient came today.  Her blood pressure is 135/76, pulse 100,  respirations 18, and sats are 94%.  Lungs are clear to auscultation and  percussion.  Her chest incision is well healed where she had a  segmentectomy.  She saw Dr. 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  I appreciate return to work  and gave her a prescription of Ativan 0.5, #40 for ongoing anxiety  problem.  I will see her back again in 6 weeks with a chest x-ray.   Nicanor Alcon, M.D.  Electronically Signed   DPB/MEDQ  D:  08/15/2008  T:  08/15/2008  Job:  [id number]   cc:   Eilleen Kempf, MD

## 2010-11-26 NOTE — Letter (Signed)
July 05, 2008   Amy Shepard  [place] [address]   Re:  Amy Shepard, CRANDELL                  DOB:  1946-05-15   Dear Dr. Rosana Hoes:   I appreciate the referral of the patient.  This 65 year old patient was  admitted to the hospital in 2008 with severe pneumonia and respiratory  distress requiring intubation.  A CT scan that showed multiple  pneumonia.  She recently had a chest x-ray and that showed her right  upper lobe mass.  Then had a CT scan that showed a noncalcified 1 x 1.6  nodule in the superior segment of the right lower lobe with some mild  adenopathy.  She had no fever, chills, or excessive sputum recently.  She smoked for many years, but quit smoking after April 2008, when she  was on the respirator.  She works as a Marine scientist in [employer].  Screening pulmonary function tests showed an FVC of 2.43 which is 91%  predicted with an FEV-1 of 1.88 or 86% predicted.  She gets short of  breath when climbing stairs.  She had no hemoptysis, fever, chills, or  excessive sputum.   PAST MEDICAL HISTORY:  She is allergic to aspirin.  She has diabetes  mellitus type 2.   MEDICATIONS:  Metformin 1000 mg twice a day, vitamin D, fish oil, and  aspirin.  No other surgeries.   FAMILY HISTORY:  Noncontributory.   SOCIAL HISTORY:  She has 2 children, who came in with her today.  She is  a Marine scientist.  Smokes a pack and half a day for 40 years.  Does not drink  alcohol on a regular basis.   REVIEW OF SYSTEMS:  She is 184 pounds.  She is 5 feet 1.  In general,  weight has been stable.  Cardiac:  No angina or atrial fibrillation.  Pulmonary:  She has no hemoptysis or bronchitis, see history of present  illness.  GI:  Has had intermittent diarrhea.  GU:  No kidney disease,  dysuria, or frequent urination.  Vascular:  No claudication, DVT, or  TIAs.  Neurological:  No dizziness, headaches, blackouts, or seizures.  Musculoskeletal:  No arthritis or joint  pain.  No psychiatric illness.  Eye/ENT:  No change in eyesight or hearing.  Hematological:  No problems  with bleeding, clotting disorders, or anemia.   PHYSICAL EXAMINATION:  GENERAL:  She is a slightly obese Caucasian  female in no acute distress.  HEAD, EYES, EARS, NOSE, AND THROAT:  Unremarkable.  NECK:  Supple without thyromegaly.  There is no supraclavicular or  axillary adenopathy.  CHEST:  Clear to auscultation and percussion.  HEART:  Regular sinus rhythm.  No murmurs.  ABDOMEN:  Obese.  Bowel sounds normal.  EXTREMITIES:  Pulses are 2+.  There is no clubbing or edema.  NEUROLOGICAL:  She is oriented x3.   I think unfortunately, this is probably a non-small cell lung cancer in  the superior segment.  I am somewhat concerned about the adenopathy.  I  will plan to get a PET scan first and decide whether she needs to have a  bronchoscopy, mediastinoscopy or we can proceed with a right lower lobe  superior segmentectomy.  We will also get a full set of pulmonary  function tests. We will see her back in 1 week.   Nicanor Alcon, M.D.  Electronically Signed   DPB/MEDQ  D:  07/05/2008  T:  07/06/2008  Job:  [id number]   cc:   Rennis Harding, M.D.

## 2010-11-26 NOTE — Discharge Summary (Signed)
Amy Shepard, Amy Shepard          ACCOUNT NO.:  [id number]   MEDICAL RECORD NO.:  [id number]          PATIENT TYPE:  INP   LOCATION:  4132                         FACILITY:  [facility]   PHYSICIAN:  Nicanor Alcon, M.D. DATE OF BIRTH:  [birth year]   DATE OF ADMISSION:  07/20/2008  DATE OF DISCHARGE:  07/24/2008                               DISCHARGE SUMMARY   HISTORY:  The patient is a 65 year old female referred to Dr. Arlyce Dice for  evaluation of a right lung mass.  She had a history of pneumonia in 2008  with significant respiratory distress causing intubation.  Additionally,  she has a history of tobacco abuse for approximately 40 years.  She was  recently found on chest x-ray to have a right lower lobe lung mass which  was confirmed by CT scan.  This revealed a 1 x 1.6-cm nodule in the  superior segment of the right lower lobe with evidence additionally of  mild adenopathy.  The patient had a PET scan, which revealed an SUV  uptake of 2.3.  It was Dr. Lorelei Pont opinion that she should undergo  video-assisted thoracoscopy for resection.  She was admitted this  hospitalization for the procedure.   PAST MEDICAL HISTORY:  1. COPD with history of tobacco use.  2. History of diabetes mellitus type 2.   ALLERGIES:  ASPIRIN.   MEDICATIONS:  Prior to admission included  1. Aspirin 81 mg daily.  2. Metformin 1000 mg twice daily.  3. Levaquin 500 mg daily.  4. Fish oil 1200 mg daily.  5. Vitamin D 2000 units daily.  6. Calcium with vitamin D 500 mg twice daily.  7. Multivitamin daily.  8. Lutein daily.  9. Selenium daily.  10.Coenzyme Q10 daily.  11.Cinnamon once daily.  12.Chromium picolinate daily.  13.Ativan 0.5 mg twice daily.   Family history, social history, review of symptoms and physical exam,  please see the history and physical done at the time of admission.   HOSPITAL COURSE:  The patient was admitted electively and on July 20, 2008, taken to the operating room where  she underwent a right lower lobe  superior segmentectomy.  She tolerated the procedure well, was taken to  postanesthesia care unit in stable condition.   Postoperative hospital course:  The patient has had a very steady  progression in her recovery.  All routine lines, monitors, and drainage  devices have been discontinued in a standard fashion.  Her incision is  healing well without signs of infection.  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  Final  pathology report is currently pending in the chart.  Her activity has  been increased and she is tolerating routine activities commenced at her  level of postoperative convalescence.  Her laboratory values are felt to  be stable.  Her most recent hemoglobin is 9.1 on July 23, 2008.  Overall, her status is felt to be stable for tentative discharge in the  morning of July 24, 2008, pending morning around reevaluation.   MEDICATIONS ON DISCHARGE:  As preoperatively, additionally for pain  Tylox  1-2 every 4-6 hours as needed.   CONDITION ON DISCHARGE:  Stable and improving.   FINAL DIAGNOSIS:  Right lower lobe superior segmentectomy with mixed  carcinoma findings.  Pathology report pending.   OTHER DIAGNOSES:  1. Tobacco abuse.  2. Chronic obstructive pulmonary disease.  3. Diabetes mellitus type 2.   INSTRUCTIONS:  The patient will receive written instructions in regard  to medications, activity, diet, wound care and followup.   FOLLOWUP:  Dr. Arlyce Dice in 1 week with a chest x-ray.      John Giovanni, P.A.-C.      Nicanor Alcon, M.D.  Electronically Signed    WEG/MEDQ  D:  07/23/2008  T:  07/24/2008  Job:  [id number]   cc:   Nicanor Alcon, M.D.

## 2010-11-26 NOTE — Letter (Signed)
August 02, 2008   [name]  [place]  [place] [address]   Re:  Amy Shepard, BELLOWS                  DOB:  [birth year]   Dear Dr. Rosana Hoes:   I saw the patient back today.  Her blood pressure is 156/89, pulse 96,  respirations 18, and sats were 92%.  She still complains of shortness of  breath with exertion.  She is not taking any pain medication, but is  still taking Ativan at 0.5 three times a day p.r.n.  We removed her  chest tube sutures and her incisional sutures and she is doing well.  Both the incisions have healed well.  Her chest x-ray showed normal  postoperative change after a right lower lobe superior segmentectomy.  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  There was a small satellite nodule, indicate this was T3.  She will  definitely need to have chemotherapy for the small cell component and I  am referring her to Dr. Curt Bears for evaluation.  I discussed her  lymph nodes were all negative.  I will see her back again in 2 weeks and  discuss about when she can return to work.   Nicanor Alcon, M.D.  Electronically Signed   DPB/MEDQ  D:  08/02/2008  T:  08/03/2008  Job:  [id number]   cc:   Eilleen Kempf, MD

## 2010-11-26 NOTE — Assessment & Plan Note (Signed)
OFFICE VISIT   Amy Shepard, Amy Shepard  DOB:  Apr 14, 1946                                        Nov 22, 2008  CHART #:  [id number]   The patient returns today and looks great after her chemotherapy.  Her  blood pressure was 128/77, pulse 83, respirations 18, and saturations  were 95%.  CT scan showed no evidence of recurrence.  She has another  one scheduled in 3 months.  She is doing well overall and is back to  work.  We will plan to see her back again after her next CT scan.   Nicanor Alcon, M.D.  Electronically Signed   DPB/MEDQ  D:  11/22/2008  T:  11/23/2008  Job:  [id number]

## 2010-11-26 NOTE — Assessment & Plan Note (Signed)
OFFICE VISIT   KURT, AZIMI  DOB:  [birth year]                                        September 27, 2008  CHART #:  [id number]   The patient came today and her chest x-ray showed normal postoperative  changes.  She has had 2 cycles of chemotherapy and has problems with the  Neulasta but otherwise is tolerating it well.  Her blood pressure was  118/73, pulse rate 86, respirations 18, and sats were 96%.  I plan to  see her back again in 2 months with a chest x-ray.   Nicanor Alcon, M.D.  Electronically Signed   DPB/MEDQ  D:  09/27/2008  T:  09/28/2008  Job:  [id number]

## 2010-11-29 NOTE — H&P (Signed)
NAMEJARELLY, RINCK          ACCOUNT NO.:  [id number]   MEDICAL RECORD NO.:  [id number]          PATIENT TYPE:  EMS   LOCATION:  ED                           [facility]ACILITY[facility]  [facility]   PHYSICIAN:  Neysa Bonito, MD  DATE OF BIRTH:  06-Aug-1945   DATE OF ADMISSION:  11/06/2006  DATE OF DISCHARGE:                              HISTORY & PHYSICAL   CHIEF COMPLAINT:  Weakness.   HISTORY OF PRESENT ILLNESS:  This is a 65 year old pleasant Caucasian  female with no significant past medical history.  She was in her usual  state of health up to this morning when she felt severely weak, that she  had to take herself to urgent care.  In the urgent care unit, the  patient had an x-ray taken, then she was sent to the emergency room  because of the evidence of pneumonia.  Upon questioning the patient  regarding cough, she stated that she always has a cough, but she noticed  off and on phlegm, the color could not specify.  She denied chest pain  or shortness of breath.  She admits to have some nausea, especially in  the morning with her weakness but denied vomiting.   PAST MEDICAL HISTORY:  Not significant.   FAMILY HISTORY:  Diabetes, heart disease from the father's side.   SOCIAL HISTORY:  She is a 35-pack-year smoker.  She is still a smoker.  Denied alcohol or drug abuse.   DRUG ALLERGIES:  Allergic to AMOXICILLIN.  She stated it gives her a  rash.   MEDICATIONS:  She is not taking any.   REVIEW OF SYSTEMS:  Nausea, as per HPI.   PHYSICAL EXAMINATION:  VITAL SIGNS:  Temperature 101, pulse 116,  respiratory rate 20, blood pressure 88/56.  GENERAL:  She was comfortable in the bed, not in acute distress.  HEENT:  Head is normocephalic and atraumatic.  Eyes:  PERRL.  Mouth  moist.  No ulcer.  NECK:  Supple.  No JVD.  CHEST:  She has left basilar crepitation and some wheezes, mainly on the  left side.  Precordium, first and second heart sounds audible.  ABDOMEN:  Soft and nontender.   Bowel sounds present.  GU:  No CVA tenderness.  EXTREMITIES:  No edema.  SKIN:  No hyperpigmentation.  NEUROLOGIC:  She is alert and oriented x3, giving history, and moves all  of her extremities spontaneously.   LABS/CHEST X-RAY:  The x-ray was provided in a disk to the emergency  physician, who communicated the evidence of pneumonia to me.   Sodium 138, potassium 3.4, chloride 103, bicarb 25, BUN 12, creatinine  9.3, and glucose 291.   IMPRESSION/PLAN:  This is a 65 year old smoker with no significant past  medical history, who came today with pneumonia with evidence of fever  and low blood pressure now.   PLAN:  1. Patient will be admitted for further assessment and management.  2. Patient will be started on IV antibiotics.  Will start with      Rocephin and Zithromax for community-acquired pneumonia, CAP.  3. Patient will be continued on IV fluids.  4.  Patient will have further lab tests to obtain her white blood count      with CBC.  5. Patient will also be put in for chest x-ray, PA and lateral.  6. For GI prophylaxis, will consider Protonix.  7. For DVT prophylaxis, will consider Lovenox.      Neysa Bonito, MD  Electronically Signed     EME/MEDQ  D:  11/06/2006  T:  11/06/2006  Job:  [id number]

## 2010-11-29 NOTE — Discharge Summary (Signed)
Amy Shepard, Amy Shepard          ACCOUNT NO.:  [id number]   MEDICAL RECORD NO.:  [id number]          PATIENT TYPE:  INP   LOCATION:  7412                         FACILITY:  [facility]   PHYSICIAN:  Sharlet Salina, M.D.   DATE OF BIRTH:  August 24, 1945   DATE OF ADMISSION:  11/06/2006  DATE OF DISCHARGE:  11/16/2006                               DISCHARGE SUMMARY   DISCHARGE DIAGNOSES:  1. Pneumonia, community acquired.  2. Ventilatory-dependent respiratory failure secondary to #1.  3. Diabetes.  4. Tobacco abuse.  5. Leukocytosis.  6. Hypotension.   DISCHARGE MEDICATIONS:  1. Levaquin 500 mg daily for 5 days.  2. Metformin 1000 mg twice daily.  3. Aspirin 81 mg daily.   CONSULTANTS:  Pulmonary critical care.   PROCEDURES:  1. Intubated on November 06, 2006, and extubated on Nov 12, 2006.  2. Also central line inserted on November 06, 2006.  3. PIC line placed on November 10, 2006.  4. A 2-D echo done on November 11, 2006.   HISTORY OF PRESENT ILLNESS:  The patient is a 65 year old female that  presented to the emergency room with cough.  She stated that she was in  her usual state of health until that morning.  She felt weak, went to  the urgent care, had x-ray done and she was sent to the emergency room  secondary to evidence of pneumonia.   PAST MEDICAL HISTORY, FAMILY HISTORY, SOCIAL HISTORY, MEDICATIONS,  ALLERGIES, REVIEW OF SYSTEMS:  Per admission H&P.   PHYSICAL EXAMINATION ON DISCHARGE:  VITAL SIGNS:  Temperature 97.9,  pulse of 83, respirations 18, blood pressure 122/67, CBG 126-185, pulse  ox 90-95% on room air.  HEENT:  Normocephalic, atraumatic.  Pupils equal, round, reactive to  light.  Throat without erythema.  CARDIOVASCULAR:  Regular rate and rhythm.  LUNGS:  Clear bilaterally with minimal rhonchi in the bases.  ABDOMEN:  Positive bowel sounds.  EXTREMITIES:  Without edema.   HOSPITAL COURSE:  Problem 1. PNEUMONIA.  The patient came in with  pneumonia, rapidly declined  into ventilatory-dependent respiratory  failure.  The patient was intubated and stayed on the vent for a few  days and given IV antibiotics.  The patient improved and was extubated  and transferred back to the floor.  Over the next 2 days, she gradually  improved.  Continuing with the antibiotic therapy.  She was discharged  on Levaquin for 5 days.  Prior to discharge, she was ambulating down the  hall without oxygen in no distress at all.  The patient should have  repeat chest x-ray on followup with her primary care physician to  evaluate clearance of infiltrate of pneumonia.   Problem 2. DIABETES.  The patient was previously diagnosed with diabetes  but was on no medication.  Blood sugars were elevated in the hospital.  She was started on metformin and given prescription for metformin to  take outpatient.  She will follow up with primary care physician on an  outpatient basis.   Problem 3. TOBACCO ABUSE.  The patient received a nicotine patch while  she was inpatient and counseled on smoking cessation.  Problem 4. LEUKOCYTOSIS.  The patient had some leukocytosis on lab work  done but resolved prior to discharge.   LABORATORIES:  Chest x-ray: Bilateral lower lobe air space opacity seen  on chest x-ray.  A 2-D echo with normal ejection fraction.  WBC 10.2,  hemoglobin 12.1, platelets 336, sodium 138, potassium 3.8, chloride 103,  CO2 27, glucose 140, BUN 17, creatinine 0.82, magnesium 2.2.  Sputum  culture negative for Strep pneumoniae.      Sharlet Salina, M.D.  Electronically Signed     [place]/MEDQ  D:  11/16/2006  T:  11/16/2006  Job:  [id number]

## 2011-01-01 ENCOUNTER — Ambulatory Visit (INDEPENDENT_AMBULATORY_CARE_PROVIDER_SITE_OTHER): Payer: BC Managed Care – PPO | Admitting: Internal Medicine

## 2011-01-01 DIAGNOSIS — R0609 Other forms of dyspnea: Secondary | ICD-10-CM

## 2011-01-01 LAB — PULMONARY FUNCTION TEST

## 2011-01-01 NOTE — Progress Notes (Signed)
PFT done today. 

## 2011-01-06 ENCOUNTER — Encounter: Payer: Self-pay | Admitting: Family Medicine

## 2011-01-17 ENCOUNTER — Other Ambulatory Visit: Payer: Self-pay | Admitting: Internal Medicine

## 2011-01-17 ENCOUNTER — Encounter (HOSPITAL_BASED_OUTPATIENT_CLINIC_OR_DEPARTMENT_OTHER): Payer: BC Managed Care – PPO | Admitting: Internal Medicine

## 2011-01-17 ENCOUNTER — Ambulatory Visit (HOSPITAL_COMMUNITY)
Admission: RE | Admit: 2011-01-17 | Discharge: 2011-01-17 | Disposition: A | Discharge status: Home / Self Care | Payer: BC Managed Care – PPO | Source: Ambulatory Visit | Attending: Internal Medicine | Admitting: Internal Medicine

## 2011-01-17 DIAGNOSIS — R7309 Other abnormal glucose: Secondary | ICD-10-CM

## 2011-01-17 DIAGNOSIS — C3490 Malignant neoplasm of unspecified part of unspecified bronchus or lung: Secondary | ICD-10-CM

## 2011-01-17 DIAGNOSIS — C3430 Malignant neoplasm of lower lobe, unspecified bronchus or lung: Secondary | ICD-10-CM

## 2011-01-17 DIAGNOSIS — F3289 Other specified depressive episodes: Secondary | ICD-10-CM

## 2011-01-17 DIAGNOSIS — Z85118 Personal history of other malignant neoplasm of bronchus and lung: Secondary | ICD-10-CM

## 2011-01-17 DIAGNOSIS — F329 Major depressive disorder, single episode, unspecified: Secondary | ICD-10-CM

## 2011-01-17 LAB — CMP (CANCER CENTER ONLY)
AST: 24 U/L (ref 11–38)
Albumin: 3.7 g/dL (ref 3.3–5.5)
Alkaline Phosphatase: 89 U/L — ABNORMAL HIGH (ref 26–84)
BUN, Bld: 14 mg/dL (ref 7–22)
Potassium: 4.3 mEq/L (ref 3.3–4.7)
Total Bilirubin: 0.4 mg/dl (ref 0.20–1.60)

## 2011-01-17 LAB — CBC WITH DIFFERENTIAL/PLATELET
Basophils Absolute: 0 10*3/uL (ref 0.0–0.1)
EOS%: 1.3 % (ref 0.0–7.0)
HGB: 14.5 g/dL (ref 11.6–15.9)
LYMPH%: 23.2 % (ref 14.0–49.7)
MCH: 30.6 pg (ref 25.1–34.0)
MCV: 88.3 fL (ref 79.5–101.0)
MONO%: 9 % (ref 0.0–14.0)
RBC: 4.75 10*6/uL (ref 3.70–5.45)
RDW: 13.3 % (ref 11.2–14.5)

## 2011-01-20 ENCOUNTER — Other Ambulatory Visit: Payer: Self-pay | Admitting: Internal Medicine

## 2011-01-20 DIAGNOSIS — C3490 Malignant neoplasm of unspecified part of unspecified bronchus or lung: Secondary | ICD-10-CM

## 2011-01-22 ENCOUNTER — Other Ambulatory Visit (HOSPITAL_COMMUNITY): Payer: BC Managed Care – PPO

## 2011-01-24 ENCOUNTER — Encounter (HOSPITAL_COMMUNITY): Payer: Self-pay

## 2011-01-24 ENCOUNTER — Ambulatory Visit (HOSPITAL_COMMUNITY)
Admission: RE | Admit: 2011-01-24 | Discharge: 2011-01-24 | Disposition: A | Discharge status: Home / Self Care | Payer: BC Managed Care – PPO | Source: Ambulatory Visit | Attending: Internal Medicine | Admitting: Internal Medicine

## 2011-01-24 DIAGNOSIS — Z09 Encounter for follow-up examination after completed treatment for conditions other than malignant neoplasm: Secondary | ICD-10-CM | POA: Insufficient documentation

## 2011-01-24 DIAGNOSIS — K7689 Other specified diseases of liver: Secondary | ICD-10-CM | POA: Insufficient documentation

## 2011-01-24 DIAGNOSIS — C3490 Malignant neoplasm of unspecified part of unspecified bronchus or lung: Secondary | ICD-10-CM | POA: Insufficient documentation

## 2011-01-24 MED ORDER — IOHEXOL 300 MG/ML  SOLN
80.0000 mL | Freq: Once | INTRAMUSCULAR | Status: AC | PRN
  Administered 2011-01-24: 80 mL via INTRAVENOUS

## 2011-02-04 ENCOUNTER — Encounter (HOSPITAL_BASED_OUTPATIENT_CLINIC_OR_DEPARTMENT_OTHER): Payer: BC Managed Care – PPO | Admitting: Internal Medicine

## 2011-02-04 DIAGNOSIS — C3430 Malignant neoplasm of lower lobe, unspecified bronchus or lung: Secondary | ICD-10-CM

## 2011-04-18 LAB — GLUCOSE, CAPILLARY: Glucose-Capillary: 177 mg/dL — ABNORMAL HIGH (ref 70–99)

## 2011-05-09 ENCOUNTER — Ambulatory Visit
Admission: RE | Admit: 2011-05-09 | Discharge: 2011-05-09 | Disposition: A | Discharge status: Home / Self Care | Payer: BC Managed Care – PPO | Source: Ambulatory Visit | Attending: Family Medicine | Admitting: Family Medicine

## 2011-05-09 ENCOUNTER — Other Ambulatory Visit: Payer: Self-pay | Admitting: Family Medicine

## 2011-05-09 DIAGNOSIS — J441 Chronic obstructive pulmonary disease with (acute) exacerbation: Secondary | ICD-10-CM

## 2011-07-11 ENCOUNTER — Other Ambulatory Visit: Payer: Self-pay | Admitting: Internal Medicine

## 2011-07-11 DIAGNOSIS — C3490 Malignant neoplasm of unspecified part of unspecified bronchus or lung: Secondary | ICD-10-CM

## 2011-07-31 ENCOUNTER — Inpatient Hospital Stay (HOSPITAL_COMMUNITY)
Admission: RE | Admit: 2011-07-31 | Discharge: 2011-07-31 | Payer: BC Managed Care – PPO | Source: Ambulatory Visit | Attending: Internal Medicine | Admitting: Internal Medicine

## 2011-08-01 ENCOUNTER — Other Ambulatory Visit: Payer: BC Managed Care – PPO | Admitting: Lab

## 2011-08-01 ENCOUNTER — Other Ambulatory Visit: Payer: Self-pay | Admitting: *Deleted

## 2011-08-01 DIAGNOSIS — C3490 Malignant neoplasm of unspecified part of unspecified bronchus or lung: Secondary | ICD-10-CM

## 2011-08-04 ENCOUNTER — Telehealth: Payer: Self-pay | Admitting: Internal Medicine

## 2011-08-04 NOTE — Telephone Encounter (Signed)
pt had called to r/s her appts,r/s to 2/18 and 2/20 per pt req,[place]     aom

## 2011-08-06 ENCOUNTER — Ambulatory Visit: Payer: BC Managed Care – PPO | Admitting: Internal Medicine

## 2011-09-01 ENCOUNTER — Ambulatory Visit (HOSPITAL_COMMUNITY)
Admission: RE | Admit: 2011-09-01 | Discharge: 2011-09-01 | Disposition: A | Discharge status: Home / Self Care | Payer: BC Managed Care – PPO | Source: Ambulatory Visit | Attending: Internal Medicine | Admitting: Internal Medicine

## 2011-09-01 ENCOUNTER — Other Ambulatory Visit: Payer: BC Managed Care – PPO | Admitting: Lab

## 2011-09-01 ENCOUNTER — Encounter (HOSPITAL_COMMUNITY): Payer: Self-pay

## 2011-09-01 DIAGNOSIS — R7309 Other abnormal glucose: Secondary | ICD-10-CM

## 2011-09-01 DIAGNOSIS — K7689 Other specified diseases of liver: Secondary | ICD-10-CM | POA: Insufficient documentation

## 2011-09-01 DIAGNOSIS — C3490 Malignant neoplasm of unspecified part of unspecified bronchus or lung: Secondary | ICD-10-CM | POA: Insufficient documentation

## 2011-09-01 DIAGNOSIS — I2510 Atherosclerotic heart disease of native coronary artery without angina pectoris: Secondary | ICD-10-CM | POA: Insufficient documentation

## 2011-09-01 DIAGNOSIS — K7460 Unspecified cirrhosis of liver: Secondary | ICD-10-CM | POA: Insufficient documentation

## 2011-09-01 LAB — CMP (CANCER CENTER ONLY)
ALT(SGPT): 26 U/L (ref 10–47)
CO2: 28 mEq/L (ref 18–33)
Calcium: 8.9 mg/dL (ref 8.0–10.3)
Chloride: 98 mEq/L (ref 98–108)
Glucose, Bld: 264 mg/dL — ABNORMAL HIGH (ref 73–118)
Sodium: 141 mEq/L (ref 128–145)
Total Bilirubin: 0.5 mg/dl (ref 0.20–1.60)
Total Protein: 7.4 g/dL (ref 6.4–8.1)

## 2011-09-01 LAB — CBC WITH DIFFERENTIAL/PLATELET
BASO%: 0.4 % (ref 0.0–2.0)
Eosinophils Absolute: 0.1 10*3/uL (ref 0.0–0.5)
HCT: 42.5 % (ref 34.8–46.6)
LYMPH%: 15.3 % (ref 14.0–49.7)
MCHC: 34.4 g/dL (ref 31.5–36.0)
MONO#: 0.8 10*3/uL (ref 0.1–0.9)
NEUT#: 4.4 10*3/uL (ref 1.5–6.5)
NEUT%: 70.1 % (ref 38.4–76.8)
Platelets: 159 10*3/uL (ref 145–400)
RBC: 4.95 10*6/uL (ref 3.70–5.45)
WBC: 6.2 10*3/uL (ref 3.9–10.3)
lymph#: 1 10*3/uL (ref 0.9–3.3)

## 2011-09-01 MED ORDER — IOHEXOL 300 MG/ML  SOLN
80.0000 mL | Freq: Once | INTRAMUSCULAR | Status: AC | PRN
  Administered 2011-09-01: 80 mL via INTRAVENOUS

## 2011-09-03 ENCOUNTER — Telehealth: Payer: Self-pay | Admitting: Internal Medicine

## 2011-09-03 ENCOUNTER — Encounter: Payer: Self-pay | Admitting: Internal Medicine

## 2011-09-03 ENCOUNTER — Ambulatory Visit (HOSPITAL_BASED_OUTPATIENT_CLINIC_OR_DEPARTMENT_OTHER): Payer: BC Managed Care – PPO | Admitting: Internal Medicine

## 2011-09-03 VITALS — BP 153/86 | HR 95 | Temp 97.3°F | Ht 61.5 in | Wt 185.1 lb

## 2011-09-03 DIAGNOSIS — C3490 Malignant neoplasm of unspecified part of unspecified bronchus or lung: Secondary | ICD-10-CM

## 2011-09-03 DIAGNOSIS — I10 Essential (primary) hypertension: Secondary | ICD-10-CM

## 2011-09-03 DIAGNOSIS — J40 Bronchitis, not specified as acute or chronic: Secondary | ICD-10-CM

## 2011-09-03 MED ORDER — AZITHROMYCIN 250 MG PO TABS: ORAL_TABLET | ORAL | Status: AC

## 2011-09-03 NOTE — Progress Notes (Signed)
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  PRIOR THERAPY:   1. Status post right lower lobe superior segmentectomy with lymph node dissection under the care of Dr. Arlyce Dice on July 20, 2008. 2. Status post adjuvant chemotherapy with carboplatin and etoposide for a total of 4 cycles.  Last dose was given 10/25/2008.  CURRENT THERAPY:  Observation.  INTERVAL HISTORY: Amy Shepard 66 y.o. female returns to the clinic today for routine six-month followup visit. The patient has no complaints today except for bronchitis started recently. She denied having any fever or chills. She has no chest pain but shortness breath with exertion and cough productive of grayish sputum. He has no significant weight loss or night sweats. The patient has repeat CT scan of the chest performed recently and she is here today for evaluation and discussion of her scan results.  MEDICAL HISTORY: Past Medical History  Diagnosis Date  . Cancer     lung ca  . Diabetes mellitus   . COPD (chronic obstructive pulmonary disease)   . Hypercholesteremia     ALLERGIES:  is allergic to amoxicillin.  MEDICATIONS:  Current Outpatient Prescriptions  Medication Sig Dispense Refill  . aspirin 81 MG tablet Take 81 mg by mouth daily.      . calcium-vitamin D (OSCAL WITH D) 500-200 MG-UNIT per tablet Take 1 tablet by mouth 2 (two) times daily.      . celecoxib (CELEBREX) 200 MG capsule Take 200 mg by mouth daily.      . Cholecalciferol (VITAMIN D-3 PO) Take 2,000 mg by mouth daily.      . fish oil-omega-3 fatty acids 1000 MG capsule Take 1 g by mouth daily.      [name] glimepiride (AMARYL) 4 MG tablet Take 4 mg by mouth daily.      [name] lisinopril  (PRINIVIL,ZESTRIL) 10 MG tablet Take 10 mg by mouth daily.      [name] LORazepam (ATIVAN) 0.5 MG tablet Take 0.5 mg by mouth as needed.      . metFORMIN (GLUCOPHAGE) 1000 MG tablet Take 1,000 mg by mouth 2 (two) times daily with a meal.      . Multiple Vitamin (MULTIVITAMIN) tablet Take 1 tablet by mouth daily.      [name] azithromycin (ZITHROMAX) 250 MG tablet Use as instructed  6 each  0    REVIEW OF SYSTEMS:  A comprehensive review of systems was negative except for: Respiratory: positive for cough, dyspnea on exertion and sputum   PHYSICAL EXAMINATION: General appearance: alert, cooperative and no distress Head: Normocephalic, without obvious abnormality, atraumatic Neck: no adenopathy Lymph nodes: Cervical, supraclavicular, and axillary nodes normal. Resp: clear to auscultation bilaterally Cardio: regular rate and rhythm, S1, S2 normal, no murmur, click, rub or gallop GI: soft, non-tender; bowel sounds normal; no masses,  no organomegaly Extremities: extremities normal, atraumatic, no cyanosis or edema Neurologic: Alert and oriented X 3, normal strength and tone. Normal symmetric reflexes. Normal coordination and gait  ECOG PERFORMANCE STATUS: 1 - Symptomatic but completely ambulatory  Blood pressure 153/86, pulse 95, temperature 97.3 F (36.3 C), temperature source Oral, height 5' 1.5" (1.562 m), weight 185 lb 1.6 oz (83.961 kg).  LABORATORY DATA: Lab Results  Component Value Date  WBC 6.2 09/01/2011   HGB 14.6 09/01/2011   HCT 42.5 09/01/2011   MCV 85.9 09/01/2011   PLT 159 09/01/2011      Chemistry      Component Value Date/Time   NA 141 09/01/2011 0804   NA 136 03/15/2010 0825   K 4.2 09/01/2011 0804   K 4.5 03/15/2010 0825   CL 98 09/01/2011 0804   CL 100 03/15/2010 0825   CO2 28 09/01/2011 0804   CO2 28 03/15/2010 0825   BUN 13 09/01/2011 0804   BUN 12 03/15/2010 0825   CREATININE 0.5* 09/01/2011 0804   CREATININE 0.63 03/15/2010 0825      Component Value Date/Time   CALCIUM 8.9  09/01/2011 0804   CALCIUM 9.5 03/15/2010 0825   ALKPHOS 115* 09/01/2011 0804   ALKPHOS 108 03/15/2010 0825   AST 25 09/01/2011 0804   AST 41* 03/15/2010 0825   ALT 51* 03/15/2010 0825   BILITOT 0.50 09/01/2011 0804   BILITOT 0.8 03/15/2010 0825       RADIOGRAPHIC STUDIES: Ct Chest W Contrast  09/01/2011  *RADIOLOGY REPORT*  Clinical Data: Follow-up lung cancer.  CT CHEST WITH CONTRAST  Technique:  Multidetector CT imaging of the chest was performed following the standard protocol during bolus administration of intravenous contrast.  Contrast: 40m OMNIPAQUE IOHEXOL 300 MG/ML IV SOLN  Comparison: 01/24/2011.  Findings: Mediastinal lymph nodes measure up to 10 mm in short axis in the low left paratracheal station, stable.  No hilar or axillary adenopathy.  Atherosclerotic calcification of the arterial vasculature, including coronary arteries.  Lipomatous hypertrophy of the interatrial septum is incidentally noted.  No pericardial effusion.  There are postoperative changes in the right lower lobe, stable. New nodular ground-glass air space disease is seen in the medial left lower lobe.  No pleural fluid.  Airway is unremarkable.  Incidental imaging of the upper abdomen shows mild low attenuation throughout the visualized portion of the liver.  Liver margin appears mildly irregular as well.  No worrisome lytic or sclerotic lesions.  Degenerative changes are seen in the spine.  IMPRESSION:  1.  Postoperative changes in the right lower lobe, stable. 2.  New nodular ground-glass air space disease in the medial left lower lobe is likely infectious or inflammatory in etiology. Please correlate clinically.  Attention on follow-up exams is warranted. 3.  Extensive coronary artery calcification. 4.  Cirrhosis and steatosis.  Original Report Authenticated By: MLuretha Rued M.D.    ASSESSMENT: This is a very pleasant 66years old white female with history of stage IIb non-small cell lung cancer mixed with small cell  diagnosed in general 2002 status post right lower lobe superior segmentectomy followed by 4 cycles of adjuvant chemotherapy with carboplatin and etoposide. The patient is doing fine except for the bronchitis. She has no evidence for disease recurrence.  PLAN: I discussed the scan results with the patient today. 1) the patient will continue in observation. I would see her back for followup visit in 6 months with repeat CT scan of the chest with contrast. 2) for bronchitis, I started the patient on Z-Pak. And she was advised to call her primary care physician if no improvement. 3) hypertension: The patient did not take her blood pressure medication earlier today. She was advised to keep taking her medication as prescribed.  All questions were answered. The patient knows to call the clinic with any problems, questions or concerns. We can certainly see the patient much sooner if necessary.

## 2011-09-03 NOTE — Telephone Encounter (Signed)
Gv pt appt for aug2013.  scheduled ct scan on 08/19 @ [facility]

## 2011-11-07 ENCOUNTER — Ambulatory Visit
Admission: RE | Admit: 2011-11-07 | Discharge: 2011-11-07 | Disposition: A | Discharge status: Home / Self Care | Payer: BC Managed Care – PPO | Source: Ambulatory Visit | Attending: Family Medicine | Admitting: Family Medicine

## 2011-11-07 ENCOUNTER — Other Ambulatory Visit: Payer: Self-pay | Admitting: Family Medicine

## 2011-11-07 DIAGNOSIS — Z85118 Personal history of other malignant neoplasm of bronchus and lung: Secondary | ICD-10-CM

## 2011-11-07 DIAGNOSIS — J449 Chronic obstructive pulmonary disease, unspecified: Secondary | ICD-10-CM

## 2011-11-07 DIAGNOSIS — Z8701 Personal history of pneumonia (recurrent): Secondary | ICD-10-CM

## 2012-02-10 ENCOUNTER — Telehealth: Payer: Self-pay | Admitting: Internal Medicine

## 2012-02-10 NOTE — Telephone Encounter (Signed)
moved pts appts per her request   aom

## 2012-02-10 NOTE — Telephone Encounter (Signed)
pt called and l/m regarding sch a ct,called pt back with ct appt that was already in place

## 2012-03-01 ENCOUNTER — Ambulatory Visit (HOSPITAL_COMMUNITY)
Admission: RE | Admit: 2012-03-01 | Discharge: 2012-03-01 | Disposition: A | Discharge status: Home / Self Care | Payer: BC Managed Care – PPO | Source: Ambulatory Visit | Attending: Internal Medicine | Admitting: Internal Medicine

## 2012-03-01 ENCOUNTER — Other Ambulatory Visit (HOSPITAL_BASED_OUTPATIENT_CLINIC_OR_DEPARTMENT_OTHER): Payer: BC Managed Care – PPO | Admitting: Lab

## 2012-03-01 DIAGNOSIS — K7689 Other specified diseases of liver: Secondary | ICD-10-CM | POA: Insufficient documentation

## 2012-03-01 DIAGNOSIS — C3490 Malignant neoplasm of unspecified part of unspecified bronchus or lung: Secondary | ICD-10-CM

## 2012-03-01 DIAGNOSIS — C3430 Malignant neoplasm of lower lobe, unspecified bronchus or lung: Secondary | ICD-10-CM

## 2012-03-01 LAB — CBC WITH DIFFERENTIAL/PLATELET
Basophils Absolute: 0 10*3/uL (ref 0.0–0.1)
EOS%: 3.7 % (ref 0.0–7.0)
HGB: 14.9 g/dL (ref 11.6–15.9)
MCH: 28.7 pg (ref 25.1–34.0)
NEUT#: 3.7 10*3/uL (ref 1.5–6.5)
RDW: 13.6 % (ref 11.2–14.5)
lymph#: 1.4 10*3/uL (ref 0.9–3.3)

## 2012-03-01 LAB — CMP (CANCER CENTER ONLY)
ALT(SGPT): 35 U/L (ref 10–47)
AST: 34 U/L (ref 11–38)
Albumin: 3.9 g/dL (ref 3.3–5.5)
BUN, Bld: 13 mg/dL (ref 7–22)
Calcium: 9.2 mg/dL (ref 8.0–10.3)
Chloride: 97 mEq/L — ABNORMAL LOW (ref 98–108)
Potassium: 4.1 mEq/L (ref 3.3–4.7)

## 2012-03-01 MED ORDER — IOHEXOL 300 MG/ML  SOLN
80.0000 mL | Freq: Once | INTRAMUSCULAR | Status: AC | PRN
  Administered 2012-03-01: 80 mL via INTRAVENOUS

## 2012-03-02 ENCOUNTER — Other Ambulatory Visit (HOSPITAL_COMMUNITY): Payer: BC Managed Care – PPO

## 2012-03-03 ENCOUNTER — Ambulatory Visit: Payer: BC Managed Care – PPO | Admitting: Internal Medicine

## 2012-03-04 ENCOUNTER — Ambulatory Visit (HOSPITAL_BASED_OUTPATIENT_CLINIC_OR_DEPARTMENT_OTHER): Payer: BC Managed Care – PPO | Admitting: Internal Medicine

## 2012-03-04 VITALS — BP 138/82 | HR 89 | Temp 97.2°F | Resp 18 | Ht 61.5 in | Wt 186.7 lb

## 2012-03-04 DIAGNOSIS — C3490 Malignant neoplasm of unspecified part of unspecified bronchus or lung: Secondary | ICD-10-CM

## 2012-03-04 DIAGNOSIS — C3430 Malignant neoplasm of lower lobe, unspecified bronchus or lung: Secondary | ICD-10-CM

## 2012-03-04 NOTE — Patient Instructions (Signed)
Follow up 6 months with scan and labs

## 2012-03-04 NOTE — Progress Notes (Signed)
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   PRIOR THERAPY:  1. Status post right lower lobe superior segmentectomy with lymph node dissection under the care of Dr. Arlyce Dice on July 20, 2008. 2. Status post adjuvant chemotherapy with carboplatin and etoposide for a total of 4 cycles. Last dose was given 10/25/2008.  CURRENT THERAPY: Observation.  INTERVAL HISTORY: Amy Shepard 66 y.o. female returns to the clinic today for routine six-month followup visit. The patient is feeling fine today with no specific complaints. She denied having any significant chest pain, shortness breath, cough or hemoptysis. She denied having any significant weight loss or night sweats. The patient has repeat CT scan of the chest performed recently and she is here today for evaluation and discussion of her scan results.  MEDICAL HISTORY: Past Medical History  Diagnosis Date  . Cancer     lung ca  . Diabetes mellitus   . COPD (chronic obstructive pulmonary disease)   . Hypercholesteremia     ALLERGIES:  is allergic to amoxicillin.  MEDICATIONS:  Current Outpatient Prescriptions  Medication Sig Dispense Refill  . aspirin 81 MG tablet Take 81 mg by mouth daily.      . calcium-vitamin D (OSCAL WITH D) 500-200 MG-UNIT per tablet Take 1 tablet by mouth 2 (two) times daily.      . celecoxib (CELEBREX) 200 MG capsule Take 200 mg by mouth daily.      . Cholecalciferol (VITAMIN D-3 PO) Take 2,000 mg by mouth daily.      . fish oil-omega-3 fatty acids 1000 MG capsule Take 1 g by mouth daily.      [name] glimepiride (AMARYL) 4 MG tablet Take 4 mg by mouth daily.      [name] lisinopril (PRINIVIL,ZESTRIL) 10 MG tablet Take 10 mg by mouth daily.      [name]  LORazepam (ATIVAN) 0.5 MG tablet Take 0.5 mg by mouth as needed.      . metFORMIN (GLUCOPHAGE) 1000 MG tablet Take 1,000 mg by mouth 2 (two) times daily with a meal.      . Multiple Vitamin (MULTIVITAMIN) tablet Take 1 tablet by mouth daily.        REVIEW OF SYSTEMS:  A comprehensive review of systems was negative.   PHYSICAL EXAMINATION: General appearance: alert, cooperative and no distress Head: Normocephalic, without obvious abnormality, atraumatic Neck: no adenopathy Lymph nodes: Cervical, supraclavicular, and axillary nodes normal. Resp: clear to auscultation bilaterally Cardio: regular rate and rhythm, S1, S2 normal, no murmur, click, rub or gallop GI: soft, non-tender; bowel sounds normal; no masses,  no organomegaly Extremities: extremities normal, atraumatic, no cyanosis or edema  ECOG PERFORMANCE STATUS: 0 - Asymptomatic  Blood pressure 138/82, pulse 89, temperature 97.2 F (36.2 C), temperature source Oral, resp. rate 18, height 5' 1.5" (1.562 m), weight 186 lb 11.2 oz (84.687 kg).  LABORATORY DATA: Lab Results  Component Value Date   WBC 5.7 03/01/2012   HGB 14.9 03/01/2012   HCT 44.3 03/01/2012   MCV 85.2 03/01/2012   PLT 187 03/01/2012      Chemistry      Component Value Date/Time   NA 141 09/01/2011 0804   NA 136 03/15/2010 0825   K 4.2 09/01/2011 0804  K 4.5 03/15/2010 0825   CL 98 09/01/2011 0804   CL 100 03/15/2010 0825   CO2 28 09/01/2011 0804   CO2 28 03/15/2010 0825   BUN 13 09/01/2011 0804   BUN 12 03/15/2010 0825   CREATININE 0.5* 09/01/2011 0804   CREATININE 0.63 03/15/2010 0825      Component Value Date/Time   CALCIUM 8.9 09/01/2011 0804   CALCIUM 9.5 03/15/2010 0825   ALKPHOS 115* 09/01/2011 0804   ALKPHOS 108 03/15/2010 0825   AST 25 09/01/2011 0804   AST 41* 03/15/2010 0825   ALT 51* 03/15/2010 0825   BILITOT 0.50 09/01/2011 0804   BILITOT 0.8 03/15/2010 0825       RADIOGRAPHIC STUDIES: Ct Chest W Contrast  03/01/2012  *RADIOLOGY REPORT*  Clinical Data: Lung  cancer staging  CT CHEST WITH CONTRAST  Technique:  Multidetector CT imaging of the chest was performed following the standard protocol during bolus administration of intravenous contrast.  Contrast: 25m OMNIPAQUE IOHEXOL 300 MG/ML  SOLN  Comparison: 09/01/2011  Findings: No axillary or supraclavicular adenopathy.  The left paratracheal lymph node measures 0.9 cm, image 15.  Previously this measured 1 cm. Prominent calcifications involving the LAD, RCA, and left circumflex identified.  No pericardial or pleural effusion identified.  Postop change within the right lower hemithorax identified.  This is stable when compared with previous exam.  No suspicious pulmonary nodule or mass identified.  Previous nodular, ground glass air space disease in the medial left lower lobe has resolved. Likely the sequela of inflammation or infection.  Review of the visualized osseous structures is significant for mild thoracic spondylosis.  No worrisome lytic or sclerotic changes identified.  Limited imaging through the upper abdomen shows fatty infiltration throughout the liver.  IMPRESSION:  1.  The postoperative changes in the right lung, stable. 2.  Resolution of nodular ground-glass air space disease in the medial left lower lobe. 3.  Fatty infiltration of the liver.   Original Report Authenticated By: TAngelita Ingles M.D. ( 03/01/2012 12:22:36 )     ASSESSMENT: This is a very pleasant 66years old white female with history of stage IIb non-small cell lung cancer next with small cell diagnosed in January of 2010 and status post right lower lobe superior segmentectomy with lymph node dissection followed by 4 cycles of adjuvant chemotherapy with carboplatin and etoposide. The patient has been observation since April 2010 with no evidence for disease recurrence.  PLAN: I discussed the scan results with the patient and recommended for her to continue on observation for now with repeat CT scan of the chest in 6 months. She was  advised to call me immediately she has any concerning symptoms in the interval..  All questions were answered. The patient knows to call the clinic with any problems, questions or concerns. We can certainly see the patient much sooner if necessary.

## 2012-03-08 ENCOUNTER — Other Ambulatory Visit (HOSPITAL_COMMUNITY): Payer: BC Managed Care – PPO

## 2012-03-09 ENCOUNTER — Telehealth: Payer: Self-pay | Admitting: Internal Medicine

## 2012-03-09 NOTE — Telephone Encounter (Signed)
S/w pt re appts for ln/ct 08/30/12 and MM 09/01/12. Per pt request lb/ct moved from 2/18 to 2/17.

## 2012-08-30 ENCOUNTER — Encounter (HOSPITAL_COMMUNITY): Payer: Self-pay

## 2012-08-30 ENCOUNTER — Other Ambulatory Visit (HOSPITAL_BASED_OUTPATIENT_CLINIC_OR_DEPARTMENT_OTHER): Payer: BC Managed Care – PPO | Admitting: Lab

## 2012-08-30 ENCOUNTER — Ambulatory Visit (HOSPITAL_COMMUNITY)
Admission: RE | Admit: 2012-08-30 | Discharge: 2012-08-30 | Disposition: A | Discharge status: Home / Self Care | Payer: BC Managed Care – PPO | Source: Ambulatory Visit | Attending: Internal Medicine | Admitting: Internal Medicine

## 2012-08-30 DIAGNOSIS — C3490 Malignant neoplasm of unspecified part of unspecified bronchus or lung: Secondary | ICD-10-CM | POA: Insufficient documentation

## 2012-08-30 DIAGNOSIS — C3430 Malignant neoplasm of lower lobe, unspecified bronchus or lung: Secondary | ICD-10-CM

## 2012-08-30 DIAGNOSIS — J984 Other disorders of lung: Secondary | ICD-10-CM | POA: Insufficient documentation

## 2012-08-30 DIAGNOSIS — I2510 Atherosclerotic heart disease of native coronary artery without angina pectoris: Secondary | ICD-10-CM | POA: Insufficient documentation

## 2012-08-30 LAB — CBC WITH DIFFERENTIAL/PLATELET
BASO%: 0.9 % (ref 0.0–2.0)
Basophils Absolute: 0.1 10*3/uL (ref 0.0–0.1)
Eosinophils Absolute: 0.2 10*3/uL (ref 0.0–0.5)
HCT: 43.1 % (ref 34.8–46.6)
HGB: 14.8 g/dL (ref 11.6–15.9)
MONO#: 0.6 10*3/uL (ref 0.1–0.9)
NEUT#: 4.2 10*3/uL (ref 1.5–6.5)
NEUT%: 66.4 % (ref 38.4–76.8)
WBC: 6.3 10*3/uL (ref 3.9–10.3)
lymph#: 1.3 10*3/uL (ref 0.9–3.3)

## 2012-08-30 LAB — COMPREHENSIVE METABOLIC PANEL (CC13)
BUN: 12.3 mg/dL (ref 7.0–26.0)
CO2: 28 mEq/L (ref 22–29)
Calcium: 9.7 mg/dL (ref 8.4–10.4)
Chloride: 104 mEq/L (ref 98–107)
Creatinine: 0.8 mg/dL (ref 0.6–1.1)

## 2012-08-30 MED ORDER — IOHEXOL 300 MG/ML  SOLN
80.0000 mL | Freq: Once | INTRAMUSCULAR | Status: AC | PRN
  Administered 2012-08-30: 80 mL via INTRAVENOUS

## 2012-08-31 ENCOUNTER — Other Ambulatory Visit (HOSPITAL_COMMUNITY): Payer: BC Managed Care – PPO

## 2012-08-31 ENCOUNTER — Other Ambulatory Visit: Payer: BC Managed Care – PPO | Admitting: Lab

## 2012-09-01 ENCOUNTER — Telehealth: Payer: Self-pay | Admitting: Internal Medicine

## 2012-09-01 ENCOUNTER — Ambulatory Visit (HOSPITAL_BASED_OUTPATIENT_CLINIC_OR_DEPARTMENT_OTHER): Payer: BC Managed Care – PPO | Admitting: Internal Medicine

## 2012-09-01 VITALS — BP 137/64 | HR 85 | Temp 97.8°F | Ht 61.5 in | Wt 186.9 lb

## 2012-09-01 DIAGNOSIS — C3430 Malignant neoplasm of lower lobe, unspecified bronchus or lung: Secondary | ICD-10-CM

## 2012-09-01 DIAGNOSIS — C3490 Malignant neoplasm of unspecified part of unspecified bronchus or lung: Secondary | ICD-10-CM

## 2012-09-01 NOTE — Patient Instructions (Addendum)
No evidence for disease recurrence on the recent scan. Followup in 6 months with repeat CT scan of the chest.

## 2012-09-01 NOTE — Progress Notes (Signed)
[place] Telephone:(336) [phone]   Fax:(336) [phone]  OFFICE PROGRESS NOTE  Carlos Levering, PA [address] 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   PRIOR THERAPY:  1. Status post right lower lobe superior segmentectomy with lymph node dissection under the care of Dr. Arlyce Dice on July 20, 2008. 2. Status post adjuvant chemotherapy with carboplatin and etoposide for a total of 4 cycles. Last dose was given 10/25/2008.  CURRENT THERAPY: Observation.  INTERVAL HISTORY: Amy Shepard 67 y.o. female returns to the clinic today for routine six-month followup visit. The patient has no complaints today. She denied having any significant weight loss or night sweats. She has no chest pain, shortness breath, cough or hemoptysis. The patient had repeat CT scan of the chest performed recently and she is here for evaluation and discussion of her scan results.  MEDICAL HISTORY: Past Medical History  Diagnosis Date  . Cancer     lung ca  . Diabetes mellitus   . COPD (chronic obstructive pulmonary disease)   . Hypercholesteremia     ALLERGIES:  is allergic to amoxicillin.  MEDICATIONS:  Current Outpatient Prescriptions  Medication Sig Dispense Refill  . aspirin 81 MG tablet Take 81 mg by mouth daily.      . calcium-vitamin D (OSCAL WITH D) 500-200 MG-UNIT per tablet Take 1 tablet by mouth 2 (two) times daily.      . celecoxib (CELEBREX) 200 MG capsule Take 200 mg by mouth daily.      . Cholecalciferol (VITAMIN D-3 PO) Take 2,000 mg by mouth daily.      . fish oil-omega-3 fatty acids 1000 MG capsule Take 1 g by mouth daily.      [name] glimepiride (AMARYL) 4 MG tablet Take 4 mg by mouth daily.      [name] lisinopril (PRINIVIL,ZESTRIL) 10 MG tablet Take 10 mg by mouth daily.      [name] LORazepam (ATIVAN) 0.5 MG tablet Take 0.5 mg by mouth as  needed.      . metFORMIN (GLUCOPHAGE) 1000 MG tablet Take 1,000 mg by mouth 2 (two) times daily with a meal.      . Multiple Vitamin (MULTIVITAMIN) tablet Take 1 tablet by mouth daily.       No current facility-administered medications for this visit.    REVIEW OF SYSTEMS:  A comprehensive review of systems was negative.   PHYSICAL EXAMINATION: General appearance: alert, cooperative and no distress Head: Normocephalic, without obvious abnormality, atraumatic Neck: no adenopathy Resp: clear to auscultation bilaterally Cardio: regular rate and rhythm, S1, S2 normal, no murmur, click, rub or gallop GI: soft, non-tender; bowel sounds normal; no masses,  no organomegaly Extremities: extremities normal, atraumatic, no cyanosis or edema  ECOG PERFORMANCE STATUS: 1 - Symptomatic but completely ambulatory  Blood pressure 137/64, pulse 85, temperature 97.8 F (36.6 C), temperature source Oral, height 5' 1.5" (1.562 m), weight 186 lb 14.4 oz (84.777 kg).  LABORATORY DATA: Lab Results  Component Value Date   WBC 6.3 08/30/2012   HGB 14.8 08/30/2012   HCT 43.1 08/30/2012   MCV 86.4 08/30/2012   PLT 177 08/30/2012      Chemistry      Component Value Date/Time   NA 141 08/30/2012 0908   NA 141 03/01/2012 1041   NA 136 03/15/2010 0825   K 4.3 08/30/2012 0908  K 4.1 03/01/2012 1041   K 4.5 03/15/2010 0825   CL 104 08/30/2012 0908   CL 97* 03/01/2012 1041   CL 100 03/15/2010 0825   CO2 28 08/30/2012 0908   CO2 31 03/01/2012 1041   CO2 28 03/15/2010 0825   BUN 12.3 08/30/2012 0908   BUN 13 03/01/2012 1041   BUN 12 03/15/2010 0825   CREATININE 0.8 08/30/2012 0908   CREATININE 0.7 03/01/2012 1041   CREATININE 0.63 03/15/2010 0825      Component Value Date/Time   CALCIUM 9.7 08/30/2012 0908   CALCIUM 9.2 03/01/2012 1041   CALCIUM 9.5 03/15/2010 0825   ALKPHOS 101 08/30/2012 0908   ALKPHOS 110* 03/01/2012 1041   ALKPHOS 108 03/15/2010 0825   AST 22 08/30/2012 0908   AST 34 03/01/2012 1041   AST 41* 03/15/2010 0825     ALT 29 08/30/2012 0908   ALT 51* 03/15/2010 0825   BILITOT 0.40 08/30/2012 0908   BILITOT 0.60 03/01/2012 1041   BILITOT 0.8 03/15/2010 0825       RADIOGRAPHIC STUDIES: Ct Chest W Contrast  08/30/2012  *RADIOLOGY REPORT*  Clinical Data: Lung cancer.  Status post wedge resection.  CT CHEST WITH CONTRAST  Technique:  Multidetector CT imaging of the chest was performed following the standard protocol during bolus administration of intravenous contrast.  Contrast: 51m OMNIPAQUE IOHEXOL 300 MG/ML  SOLN  Comparison: 03/01/2012  Findings: No axillary lymphadenopathy.  Scattered tiny lymph nodes are seen in the mediastinum but no individual node meets criteria for pathologic enlargement.  There is no hilar lymphadenopathy. Heart size is upper normal. Coronary artery calcification is noted. No pericardial or pleural effusion.  Lung windows show a suture line in the anterior aspect of the right lower lobe this has some associated scarring, but is stable.  Left lung is clear without parenchymal nodule or mass.  There is some linear scarring in the anterior left upper lobe.  Visualized portions of the adrenal glands are unremarkable although the left adrenal gland has not been incompletely imaged. Bone windows reveal no worrisome lytic or sclerotic osseous lesions.  IMPRESSION: Postsurgical scarring in the right lung.  No new or progressive findings to suggest recurrent disease.   Original Report Authenticated By: EMisty Stanley M.D.     ASSESSMENT: this is a very pleasant 67years old white female with history of stage IIb mixed non-small cell lung cancer as well as small cell carcinoma status post right lower lobectomy followed by 4 cycles of adjuvant chemotherapy and has been on observation since April 2010 with no evidence for disease recurrence   PLAN: I discussed the scan results with the patient today. I recommended for her to continue on observation with repeat CT scan of the chest in 6 months. She was  advised to call immediately if she has any concerning symptoms in the interval.  All questions were answered. The patient knows to call the clinic with any problems, questions or concerns. We can certainly see the patient much sooner if necessary.

## 2012-09-01 NOTE — Telephone Encounter (Signed)
[name]

## 2012-11-08 ENCOUNTER — Other Ambulatory Visit: Payer: Self-pay | Admitting: Gastroenterology

## 2013-02-10 ENCOUNTER — Encounter (HOSPITAL_COMMUNITY): Payer: Self-pay

## 2013-02-10 ENCOUNTER — Emergency Department (HOSPITAL_COMMUNITY)
Admission: EM | Admit: 2013-02-10 | Discharge: 2013-02-10 | Disposition: A | Discharge status: Home / Self Care | Payer: BC Managed Care – PPO | Attending: Emergency Medicine | Admitting: Emergency Medicine

## 2013-02-10 DIAGNOSIS — R6883 Chills (without fever): Secondary | ICD-10-CM | POA: Insufficient documentation

## 2013-02-10 DIAGNOSIS — R112 Nausea with vomiting, unspecified: Secondary | ICD-10-CM | POA: Insufficient documentation

## 2013-02-10 DIAGNOSIS — M549 Dorsalgia, unspecified: Secondary | ICD-10-CM | POA: Insufficient documentation

## 2013-02-10 DIAGNOSIS — J449 Chronic obstructive pulmonary disease, unspecified: Secondary | ICD-10-CM | POA: Insufficient documentation

## 2013-02-10 DIAGNOSIS — C3490 Malignant neoplasm of unspecified part of unspecified bronchus or lung: Secondary | ICD-10-CM | POA: Insufficient documentation

## 2013-02-10 DIAGNOSIS — E785 Hyperlipidemia, unspecified: Secondary | ICD-10-CM | POA: Insufficient documentation

## 2013-02-10 DIAGNOSIS — F329 Major depressive disorder, single episode, unspecified: Secondary | ICD-10-CM | POA: Insufficient documentation

## 2013-02-10 DIAGNOSIS — Z87891 Personal history of nicotine dependence: Secondary | ICD-10-CM | POA: Insufficient documentation

## 2013-02-10 DIAGNOSIS — F3289 Other specified depressive episodes: Secondary | ICD-10-CM | POA: Insufficient documentation

## 2013-02-10 DIAGNOSIS — R42 Dizziness and giddiness: Secondary | ICD-10-CM | POA: Insufficient documentation

## 2013-02-10 DIAGNOSIS — N390 Urinary tract infection, site not specified: Secondary | ICD-10-CM | POA: Insufficient documentation

## 2013-02-10 DIAGNOSIS — R5383 Other fatigue: Secondary | ICD-10-CM | POA: Insufficient documentation

## 2013-02-10 DIAGNOSIS — Z7982 Long term (current) use of aspirin: Secondary | ICD-10-CM | POA: Insufficient documentation

## 2013-02-10 DIAGNOSIS — E119 Type 2 diabetes mellitus without complications: Secondary | ICD-10-CM | POA: Insufficient documentation

## 2013-02-10 DIAGNOSIS — Z794 Long term (current) use of insulin: Secondary | ICD-10-CM | POA: Insufficient documentation

## 2013-02-10 DIAGNOSIS — Z79899 Other long term (current) drug therapy: Secondary | ICD-10-CM | POA: Insufficient documentation

## 2013-02-10 DIAGNOSIS — J4489 Other specified chronic obstructive pulmonary disease: Secondary | ICD-10-CM | POA: Insufficient documentation

## 2013-02-10 DIAGNOSIS — R5381 Other malaise: Secondary | ICD-10-CM | POA: Insufficient documentation

## 2013-02-10 DIAGNOSIS — R197 Diarrhea, unspecified: Secondary | ICD-10-CM | POA: Insufficient documentation

## 2013-02-10 HISTORY — DX: Depression, unspecified: F32.A

## 2013-02-10 HISTORY — DX: Major depressive disorder, single episode, unspecified: F32.9

## 2013-02-10 HISTORY — DX: Malignant neoplasm of unspecified part of unspecified bronchus or lung: C34.90

## 2013-02-10 LAB — BASIC METABOLIC PANEL
CO2: 25 mEq/L (ref 19–32)
GFR calc non Af Amer: 50 mL/min — ABNORMAL LOW (ref >90)
Glucose, Bld: 235 mg/dL — ABNORMAL HIGH (ref 70–99)
Potassium: 3.9 mEq/L (ref 3.5–5.1)
Sodium: 135 mEq/L (ref 135–145)

## 2013-02-10 LAB — URINE MICROSCOPIC-ADD ON

## 2013-02-10 LAB — CBC WITH DIFFERENTIAL/PLATELET
Basophils Absolute: 0 10*3/uL (ref 0.0–0.1)
Lymphocytes Relative: 12 % (ref 12–46)
Lymphs Abs: 0.8 10*3/uL (ref 0.7–4.0)
Neutrophils Relative %: 72 % (ref 43–77)
Platelets: 214 10*3/uL (ref 150–400)
RBC: 5.02 MIL/uL (ref 3.87–5.11)
RDW: 13.7 % (ref 11.5–15.5)
WBC: 7.3 10*3/uL (ref 4.0–10.5)

## 2013-02-10 LAB — URINALYSIS, ROUTINE W REFLEX MICROSCOPIC
Glucose, UA: NEGATIVE mg/dL
Protein, ur: 30 mg/dL — AB
Specific Gravity, Urine: 1.025 (ref 1.005–1.030)
Urobilinogen, UA: 0.2 mg/dL (ref 0.0–1.0)

## 2013-02-10 MED ORDER — NITROFURANTOIN MONOHYD MACRO 100 MG PO CAPS: 100.0000 mg | ORAL_CAPSULE | Freq: Two times a day (BID) | ORAL | Status: DC

## 2013-02-10 MED ORDER — ONDANSETRON HCL 4 MG/2ML IJ SOLN
4.0000 mg | Freq: Once | INTRAMUSCULAR | Status: AC
  Administered 2013-02-10: 4 mg via INTRAVENOUS
  Filled 2013-02-10: qty 2

## 2013-02-10 MED ORDER — SODIUM CHLORIDE 0.9 % IV BOLUS (SEPSIS)
1000.0000 mL | Freq: Once | INTRAVENOUS | Status: AC
  Administered 2013-02-10: 1000 mL via INTRAVENOUS

## 2013-02-10 MED ORDER — NITROFURANTOIN MONOHYD MACRO 100 MG PO CAPS
100.0000 mg | ORAL_CAPSULE | Freq: Once | ORAL | Status: AC
  Administered 2013-02-10: 100 mg via ORAL
  Filled 2013-02-10: qty 1

## 2013-02-10 NOTE — ED Notes (Signed)
States that she noticed blood in her urine this am. States that she is not sure if the bleeding could be vaginal or not. States that she has had vomiting yesterday and not this am.

## 2013-02-10 NOTE — ED Provider Notes (Signed)
CSN: [id number]     Arrival date & time 02/10/13  1005 History     First MD Initiated Contact with Patient 02/10/13 1018     Chief Complaint  Patient presents with  . Hematuria  . Emesis    HPI Sx for 5 days.  Diarrhea for 5 days.  Took Pepto 2 days ago then 1 black stool yesterday.  No stool since.  Denies blood in stool.  Had bloody urine this morning and some back pain. No fever.  Slight weak/dizzy without pre/syncope.  Past Medical History  Diagnosis Date  . Cancer     lung ca  . Diabetes mellitus   . COPD (chronic obstructive pulmonary disease)   . Hypercholesteremia   . Lung cancer   . Depression    Past Surgical History  Procedure Laterality Date  . Thoracotomy     Family History  Problem Relation Age of Onset  . Diabetes Father   . Heart failure Father   . Cancer Sister   . Diabetes Brother   . Heart failure Brother    History  Substance Use Topics  . Smoking status: Former Smoker -- 1.00 packs/day for 30 years    Quit date: 07/14/2006  . Smokeless tobacco: Never Used  . Alcohol Use: No   OB History   Grav Para Term Preterm Abortions TAB SAB Ect Mult Living                 Review of Systems  Constitutional: Positive for chills and fatigue. Negative for fever.  HENT: Negative for congestion and neck pain.   Eyes: Negative for visual disturbance.  Respiratory: Negative for chest tightness and shortness of breath.   Cardiovascular: Negative for chest pain.  Gastrointestinal: Positive for nausea and diarrhea. Negative for vomiting.  Genitourinary: Positive for hematuria. Negative for urgency.  Musculoskeletal: Positive for arthralgias.  Skin: Negative for pallor and rash.  Neurological: Positive for dizziness. Negative for syncope.  Hematological: Negative for adenopathy. Does not bruise/bleed easily.  Psychiatric/Behavioral: Negative for confusion.    Allergies  Amoxicillin  Home Medications   Current Outpatient Rx  Name  Route  Sig  Dispense   Refill  . aspirin 81 MG tablet   Oral   Take 81 mg by mouth daily.         . calcium-vitamin D (OSCAL WITH D) 500-200 MG-UNIT per tablet   Oral   Take 1 tablet by mouth 2 (two) times daily.         . celecoxib (CELEBREX) 200 MG capsule   Oral   Take 200 mg by mouth daily.         . Cholecalciferol (VITAMIN D-3 PO)   Oral   Take 2,000 mg by mouth daily.         [name] glimepiride (AMARYL) 4 MG tablet   Oral   Take 4 mg by mouth daily.         . insulin glargine (LANTUS) 100 UNIT/ML injection   Subcutaneous   Inject 23 Units into the skin at bedtime.         [name] lisinopril (PRINIVIL,ZESTRIL) 10 MG tablet   Oral   Take 10 mg by mouth daily.         [name] LORazepam (ATIVAN) 0.5 MG tablet   Oral   Take 0.5 mg by mouth as needed for anxiety (for CT skan).          . rosuvastatin (CRESTOR) 10 MG tablet   Oral  Take 10 mg by mouth daily.         . nitrofurantoin, macrocrystal-monohydrate, (MACROBID) 100 MG capsule   Oral   Take 1 capsule (100 mg total) by mouth 2 (two) times daily.   10 capsule   0    BP 98/65  Pulse 84  Temp(Src) 97.8 F (36.6 C) (Oral)  Resp 18  Ht _0  (1.549 m)  Wt 186 lb (84.369 kg)  BMI 35.16 kg/m2  SpO2 96% Physical Exam  Constitutional: She appears well-developed and well-nourished. She appears lethargic.  HENT:  Head: Normocephalic.  Eyes: Conjunctivae and EOM are normal.    Neck: No JVD present.  Cardiovascular: Normal rate and regular rhythm.  Exam reveals no S3 and no S4.   Pulmonary/Chest: She has no decreased breath sounds. She has no wheezes. She has no rhonchi. She has no rales.  Abdominal: There is no tenderness. There is CVA tenderness. There is no rigidity, no rebound and no guarding.  Musculoskeletal:  Tenderness Bilateral CVA/musculature  Neurological: She appears lethargic. She is not disoriented.  Skin: No pallor.  Psychiatric: She has a normal mood and affect. Her speech is normal.    ED Course    Procedures (including critical care time)  Labs Reviewed  CBC WITH DIFFERENTIAL - Abnormal; Notable for the following:    Hemoglobin 15.1 (*)    Monocytes Relative 13 (*)    All other components within normal limits  BASIC METABOLIC PANEL - Abnormal; Notable for the following:    Glucose, Bld 235 (*)    BUN 38 (*)    Creatinine, Ser 1.13 (*)    GFR calc non Af Amer 50 (*)    GFR calc Af Amer 57 (*)    All other components within normal limits  URINALYSIS, ROUTINE W REFLEX MICROSCOPIC - Abnormal; Notable for the following:    Color, Urine RED (*)    APPearance CLOUDY (*)    Hgb urine dipstick LARGE (*)    Bilirubin Urine SMALL (*)    Protein, ur 30 (*)    Leukocytes, UA MODERATE (*)    All other components within normal limits  URINE MICROSCOPIC-ADD ON - Abnormal; Notable for the following:    Squamous Epithelial / LPF FEW (*)    Bacteria, UA FEW (*)    Casts GRANULAR CAST (*)    All other components within normal limits  URINE CULTURE   No results found. 1. UTI (lower urinary tract infection)     MDM  Dizziness better.  Ua suggests UTI.  Discussed follow up with PCP in less than 7 days to re check and ensure resolution.  RTER with any worsening symptoms.  Lolita Patella, MD 02/10/13 [phone]

## 2013-02-10 NOTE — ED Notes (Signed)
Voiced understanding of instructions given 

## 2013-02-10 NOTE — ED Notes (Signed)
Dr. Jeneen Rinks informed of BP. Recheck 88/65 left arm 109/74 right arm. Will inform Dr. Jeneen Rinks.

## 2013-02-10 NOTE — ED Notes (Signed)
Patient reports that she has been having N/V/D x 6 days and today noted that she had bright red blood in the toilet. Patient states she noted that her stools have been black and tarry  X 2-3 days.

## 2013-02-11 LAB — URINE CULTURE

## 2013-02-28 ENCOUNTER — Encounter (HOSPITAL_COMMUNITY): Payer: Self-pay

## 2013-02-28 ENCOUNTER — Ambulatory Visit (HOSPITAL_COMMUNITY)
Admission: RE | Admit: 2013-02-28 | Discharge: 2013-02-28 | Disposition: A | Discharge status: Home / Self Care | Payer: BC Managed Care – PPO | Source: Ambulatory Visit | Attending: Internal Medicine | Admitting: Internal Medicine

## 2013-02-28 ENCOUNTER — Other Ambulatory Visit (HOSPITAL_BASED_OUTPATIENT_CLINIC_OR_DEPARTMENT_OTHER): Payer: BC Managed Care – PPO | Admitting: Lab

## 2013-02-28 DIAGNOSIS — C3490 Malignant neoplasm of unspecified part of unspecified bronchus or lung: Secondary | ICD-10-CM

## 2013-02-28 DIAGNOSIS — K7689 Other specified diseases of liver: Secondary | ICD-10-CM | POA: Insufficient documentation

## 2013-02-28 DIAGNOSIS — Z9221 Personal history of antineoplastic chemotherapy: Secondary | ICD-10-CM | POA: Insufficient documentation

## 2013-02-28 DIAGNOSIS — I700 Atherosclerosis of aorta: Secondary | ICD-10-CM | POA: Insufficient documentation

## 2013-02-28 LAB — CBC WITH DIFFERENTIAL/PLATELET
BASO%: 0.8 % (ref 0.0–2.0)
EOS%: 6.7 % (ref 0.0–7.0)
MCH: 29.8 pg (ref 25.1–34.0)
MCHC: 34.1 g/dL (ref 31.5–36.0)
MCV: 87.4 fL (ref 79.5–101.0)
MONO%: 9.3 % (ref 0.0–14.0)
NEUT%: 63.8 % (ref 38.4–76.8)
RDW: 13.5 % (ref 11.2–14.5)
lymph#: 1.7 10*3/uL (ref 0.9–3.3)

## 2013-02-28 LAB — COMPREHENSIVE METABOLIC PANEL (CC13)
ALT: 24 U/L (ref 0–55)
AST: 24 U/L (ref 5–34)
Alkaline Phosphatase: 95 U/L (ref 40–150)
Calcium: 9.7 mg/dL (ref 8.4–10.4)
Chloride: 105 mEq/L (ref 98–109)
Creatinine: 0.9 mg/dL (ref 0.6–1.1)
Potassium: 4.3 mEq/L (ref 3.5–5.1)

## 2013-02-28 MED ORDER — IOHEXOL 300 MG/ML  SOLN
80.0000 mL | Freq: Once | INTRAMUSCULAR | Status: AC | PRN
  Administered 2013-02-28: 80 mL via INTRAVENOUS

## 2013-03-02 ENCOUNTER — Encounter: Payer: Self-pay | Admitting: Internal Medicine

## 2013-03-02 ENCOUNTER — Ambulatory Visit (HOSPITAL_BASED_OUTPATIENT_CLINIC_OR_DEPARTMENT_OTHER): Payer: BC Managed Care – PPO | Admitting: Internal Medicine

## 2013-03-02 ENCOUNTER — Telehealth: Payer: Self-pay | Admitting: Internal Medicine

## 2013-03-02 DIAGNOSIS — Z85118 Personal history of other malignant neoplasm of bronchus and lung: Secondary | ICD-10-CM

## 2013-03-02 NOTE — Progress Notes (Signed)
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   PRIOR THERAPY:  1. Status post right lower lobe superior segmentectomy with lymph node dissection under the care of Dr. Arlyce Dice on July 20, 2008. 2. Status post adjuvant chemotherapy with carboplatin and etoposide for a total of 4 cycles. Last dose was given 10/25/2008.  CURRENT THERAPY: Observation.  CHEMOTHERAPY INTENT: Curative  CURRENT # OF CHEMOTHERAPY CYCLES: 0  CURRENT ANTIEMETICS: None  CURRENT SMOKING STATUS: Former smoker quit in 2008  ORAL CHEMOTHERAPY AND CONSENT: None  CURRENT BISPHOSPHONATES USE: None  PAIN MANAGEMENT: 0/10  NARCOTICS INDUCED CONSTIPATION: None  LIVING WILL AND CODE STATUS: No code Blue.   INTERVAL HISTORY: Amy Shepard 67 y.o. female returns to the clinic today for routine six-month followup visit. The patient is feeling fine today with no specific complaints. She denied having any significant chest pain, shortness breath, cough or hemoptysis. She denied having any fever or chills. She has no nausea or vomiting. The patient denied having any significant weight loss or night sweats. She had repeat CT scan of the chest performed recently and she is here for evaluation and discussion of her scan results.  MEDICAL HISTORY: Past Medical History  Diagnosis Date  . Cancer     lung ca  . Diabetes mellitus   . COPD (chronic obstructive pulmonary disease)   . Hypercholesteremia   . Lung cancer   . Depression     ALLERGIES:  is allergic to amoxicillin.  MEDICATIONS:  Current Outpatient Prescriptions  Medication Sig Dispense Refill  . aspirin 81 MG tablet Take 81 mg by mouth daily.      . calcium-vitamin D (OSCAL WITH D)  500-200 MG-UNIT per tablet Take 1 tablet by mouth 2 (two) times daily.      . celecoxib (CELEBREX) 200 MG capsule Take 200 mg by mouth daily.      . Cholecalciferol (VITAMIN D-3 PO) Take 2,000 mg by mouth daily.      [name] glimepiride (AMARYL) 4 MG tablet Take 4 mg by mouth daily.      . insulin glargine (LANTUS) 100 UNIT/ML injection Inject 23 Units into the skin at bedtime.      [name] lisinopril (PRINIVIL,ZESTRIL) 10 MG tablet Take 10 mg by mouth daily.      [name] LORazepam (ATIVAN) 0.5 MG tablet Take 0.5 mg by mouth as needed for anxiety (for CT skan).       . nitrofurantoin, macrocrystal-monohydrate, (MACROBID) 100 MG capsule Take 1 capsule (100 mg total) by mouth 2 (two) times daily.  10 capsule  0  . rosuvastatin (CRESTOR) 10 MG tablet Take 10 mg by mouth daily.      . sitaGLIPtan-metformin (JANUMET) 50-1000 MG per tablet Take 1 tablet by mouth 2 (two) times daily with a meal.       No current facility-administered medications for this visit.    SURGICAL HISTORY:  Past Surgical History  Procedure Laterality Date  . Thoracotomy      REVIEW OF SYSTEMS:  A comprehensive review of systems was negative.   PHYSICAL EXAMINATION: General appearance: alert, cooperative and no distress Head: Normocephalic, without obvious abnormality, atraumatic Neck: no adenopathy Lymph nodes: Cervical, supraclavicular, and axillary nodes normal. Resp: clear to auscultation bilaterally  Cardio: regular rate and rhythm, S1, S2 normal, no murmur, click, rub or gallop GI: soft, non-tender; bowel sounds normal; no masses,  no organomegaly Extremities: extremities normal, atraumatic, no cyanosis or edema  ECOG PERFORMANCE STATUS: 0 - Asymptomatic  Blood pressure 117/55, pulse 88, temperature 98.5 F (36.9 C), temperature source Oral, resp. rate 20, height _0  (1.549 m), weight 181 lb 12.8 oz (82.464 kg).  LABORATORY DATA: Lab Results  Component Value Date   WBC 8.8 02/28/2013   HGB 14.5 02/28/2013   HCT 42.5  02/28/2013   MCV 87.4 02/28/2013   PLT 192 02/28/2013      Chemistry      Component Value Date/Time   NA 141 02/28/2013 0757   NA 135 02/10/2013 1055   NA 141 03/01/2012 1041   K 4.3 02/28/2013 0757   K 3.9 02/10/2013 1055   K 4.1 03/01/2012 1041   CL 97 02/10/2013 1055   CL 104 08/30/2012 0908   CL 97* 03/01/2012 1041   CO2 24 02/28/2013 0757   CO2 25 02/10/2013 1055   CO2 31 03/01/2012 1041   BUN 19.7 02/28/2013 0757   BUN 38* 02/10/2013 1055   BUN 13 03/01/2012 1041   CREATININE 0.9 02/28/2013 0757   CREATININE 1.13* 02/10/2013 1055   CREATININE 0.7 03/01/2012 1041      Component Value Date/Time   CALCIUM 9.7 02/28/2013 0757   CALCIUM 9.3 02/10/2013 1055   CALCIUM 9.2 03/01/2012 1041   ALKPHOS 95 02/28/2013 0757   ALKPHOS 110* 03/01/2012 1041   ALKPHOS 108 03/15/2010 0825   AST 24 02/28/2013 0757   AST 34 03/01/2012 1041   AST 41* 03/15/2010 0825   ALT 24 02/28/2013 0757   ALT 35 03/01/2012 1041   ALT 51* 03/15/2010 0825   BILITOT 0.32 02/28/2013 0757   BILITOT 0.60 03/01/2012 1041   BILITOT 0.8 03/15/2010 0825       RADIOGRAPHIC STUDIES: Ct Chest W Contrast  02/28/2013   CLINICAL DATA:  Lung cancer diagnosed in 2009. Status post partial lung resection and completion of chemotherapy. Restaging.  EXAM: CT CHEST WITH CONTRAST  TECHNIQUE: Multidetector CT imaging of the chest was performed during intravenous contrast administration.  CONTRAST:  14m OMNIPAQUE IOHEXOL 300 MG/ML  SOLN  COMPARISON:  Chest CT 08/30/2012 and 03/01/2012.  FINDINGS: Scattered small mediastinal lymph nodes are unchanged, not pathologically enlarged. There is no hilar or axillary lymphadenopathy. There is stable atherosclerosis of the aorta, great vessels and coronary arteries.  There is no pleural or pericardial effusion. There is stable scarring in the right lower lobe status post wedge resection. There is also stable scarring within the lingula. No suspicious pulmonary nodules are demonstrated.  The visualized upper abdomen has a  stable appearance with mild hepatic steatosis. There is no adrenal mass or worrisome osseous finding.  IMPRESSION: Stable postsurgical chest. No evidence of local recurrence or metastatic disease.   Electronically Signed   By: BCamie Patience  On: 02/28/2013 09:57    ASSESSMENT AND PLAN: This is a very pleasant 67years old white female with a stage IIB mixed non-small cell lung cancer as well as small cell lung cancer diagnosed in June of 2000 and status post right lower lobe superior segmentectomy with lymph node dissection followed by 4 cycles of adjuvant chemotherapy and has been observation since April 2010 with no evidence for disease recurrence. I discussed the scan results with the patient today.  I recommended for her to continue observation with  repeat CT scan of the chest in 6 months.  She was advised to call immediately if she has any concerning symptoms in the interval.  The patient voices understanding of current disease status and treatment options and is in agreement with the current care plan.  All questions were answered. The patient knows to call the clinic with any problems, questions or concerns. We can certainly see the patient much sooner if necessary.

## 2013-03-02 NOTE — Telephone Encounter (Signed)
Gave pt appt for lab and Md on February 2015

## 2013-03-02 NOTE — Patient Instructions (Signed)
CURRENT THERAPY: Observation.  CHEMOTHERAPY INTENT: Curative  CURRENT # OF CHEMOTHERAPY CYCLES: 0  CURRENT ANTIEMETICS: None  CURRENT SMOKING STATUS: Former smoker quit in 2008  ORAL CHEMOTHERAPY AND CONSENT: None  CURRENT BISPHOSPHONATES USE: None  PAIN MANAGEMENT: 0/10  NARCOTICS INDUCED CONSTIPATION: None  LIVING WILL AND CODE STATUS: No code Blue.

## 2013-09-05 ENCOUNTER — Encounter (HOSPITAL_COMMUNITY): Payer: Self-pay

## 2013-09-05 ENCOUNTER — Ambulatory Visit (HOSPITAL_COMMUNITY)
Admission: RE | Admit: 2013-09-05 | Discharge: 2013-09-05 | Disposition: A | Discharge status: Home / Self Care | Payer: BC Managed Care – PPO | Source: Ambulatory Visit | Attending: Internal Medicine | Admitting: Internal Medicine

## 2013-09-05 ENCOUNTER — Encounter (INDEPENDENT_AMBULATORY_CARE_PROVIDER_SITE_OTHER): Payer: Self-pay

## 2013-09-05 ENCOUNTER — Other Ambulatory Visit (HOSPITAL_BASED_OUTPATIENT_CLINIC_OR_DEPARTMENT_OTHER): Payer: BC Managed Care – PPO

## 2013-09-05 DIAGNOSIS — C3430 Malignant neoplasm of lower lobe, unspecified bronchus or lung: Secondary | ICD-10-CM

## 2013-09-05 DIAGNOSIS — C3490 Malignant neoplasm of unspecified part of unspecified bronchus or lung: Secondary | ICD-10-CM | POA: Insufficient documentation

## 2013-09-05 LAB — CBC WITH DIFFERENTIAL/PLATELET
BASO%: 0.7 % (ref 0.0–2.0)
Basophils Absolute: 0.1 10*3/uL (ref 0.0–0.1)
EOS ABS: 0.1 10*3/uL (ref 0.0–0.5)
EOS%: 2 % (ref 0.0–7.0)
HCT: 44.6 % (ref 34.8–46.6)
HGB: 15 g/dL (ref 11.6–15.9)
LYMPH%: 19.9 % (ref 14.0–49.7)
MCH: 29.9 pg (ref 25.1–34.0)
MCHC: 33.7 g/dL (ref 31.5–36.0)
MCV: 88.5 fL (ref 79.5–101.0)
MONO#: 0.8 10*3/uL (ref 0.1–0.9)
MONO%: 11.1 % (ref 0.0–14.0)
NEUT%: 66.3 % (ref 38.4–76.8)
NEUTROS ABS: 4.6 10*3/uL (ref 1.5–6.5)
PLATELETS: 194 10*3/uL (ref 145–400)
RBC: 5.04 10*6/uL (ref 3.70–5.45)
RDW: 14.5 % (ref 11.2–14.5)
WBC: 6.9 10*3/uL (ref 3.9–10.3)
lymph#: 1.4 10*3/uL (ref 0.9–3.3)

## 2013-09-05 LAB — COMPREHENSIVE METABOLIC PANEL
ALBUMIN: 3.6 g/dL (ref 3.5–5.2)
ALT: 30 U/L (ref 0–35)
AST: 23 U/L (ref 0–37)
Alkaline Phosphatase: 119 U/L — ABNORMAL HIGH (ref 39–117)
BUN: 18 mg/dL (ref 6–23)
CO2: 27 meq/L (ref 19–32)
Calcium: 10.1 mg/dL (ref 8.4–10.5)
Chloride: 95 mEq/L — ABNORMAL LOW (ref 96–112)
Creatinine, Ser: 0.59 mg/dL (ref 0.50–1.10)
GLUCOSE: 356 mg/dL — AB (ref 70–99)
POTASSIUM: 3.9 meq/L (ref 3.5–5.3)
SODIUM: 135 meq/L (ref 135–145)
TOTAL PROTEIN: 7 g/dL (ref 6.0–8.3)
Total Bilirubin: <0.2 mg/dL — ABNORMAL LOW (ref 0.3–1.2)

## 2013-09-05 MED ORDER — IOHEXOL 300 MG/ML  SOLN
80.0000 mL | Freq: Once | INTRAMUSCULAR | Status: AC | PRN
  Administered 2013-09-05: 80 mL via INTRAVENOUS

## 2013-09-07 ENCOUNTER — Encounter: Payer: Self-pay | Admitting: Internal Medicine

## 2013-09-07 ENCOUNTER — Telehealth: Payer: Self-pay | Admitting: Internal Medicine

## 2013-09-07 ENCOUNTER — Ambulatory Visit (HOSPITAL_BASED_OUTPATIENT_CLINIC_OR_DEPARTMENT_OTHER): Payer: BC Managed Care – PPO | Admitting: Internal Medicine

## 2013-09-07 VITALS — BP 148/75 | HR 89 | Temp 98.1°F | Resp 20 | Ht 61.0 in | Wt 190.7 lb

## 2013-09-07 DIAGNOSIS — Z85118 Personal history of other malignant neoplasm of bronchus and lung: Secondary | ICD-10-CM

## 2013-09-07 DIAGNOSIS — C3490 Malignant neoplasm of unspecified part of unspecified bronchus or lung: Secondary | ICD-10-CM

## 2013-09-07 DIAGNOSIS — R599 Enlarged lymph nodes, unspecified: Secondary | ICD-10-CM

## 2013-09-07 NOTE — Telephone Encounter (Signed)
gv adn printed aptp sched and avs for pt for April and may

## 2013-09-07 NOTE — Progress Notes (Signed)
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   PRIOR THERAPY:  1. Status post right lower lobe superior segmentectomy with lymph node dissection under the care of Dr. Arlyce Dice on July 20, 2008. 2. Status post adjuvant chemotherapy with carboplatin and etoposide for a total of 4 cycles. Last dose was given 10/25/2008.  CURRENT THERAPY: Observation.  CHEMOTHERAPY INTENT: Curative  CURRENT # OF CHEMOTHERAPY CYCLES: 0  CURRENT ANTIEMETICS: None  CURRENT SMOKING STATUS: Former smoker quit in 2008  ORAL CHEMOTHERAPY AND CONSENT: None  CURRENT BISPHOSPHONATES USE: None  PAIN MANAGEMENT: 0/10  NARCOTICS INDUCED CONSTIPATION: None  LIVING WILL AND CODE STATUS: No code Blue.   INTERVAL HISTORY: Amy Shepard 68 y.o. female returns to the clinic today for routine six-month followup visit. The patient is feeling fine today with no specific complaints. She recently had chest congestion and acute bronchitis. She was treated by her primary care physician with a course of doxycycline. She denied having any significant chest pain, shortness of breath, cough or hemoptysis. She denied having any fever or chills. She has no nausea or vomiting. The patient denied having any significant weight loss or night sweats. She had repeat CT scan of the chest performed recently and she is here for evaluation and discussion of her scan results.  MEDICAL HISTORY: Past Medical History  Diagnosis Date  . Cancer     lung ca  . Diabetes mellitus   . COPD (chronic obstructive pulmonary disease)   . Hypercholesteremia   . Lung cancer   . Depression     ALLERGIES:  is allergic to amoxicillin.  MEDICATIONS:  Current Outpatient  Prescriptions  Medication Sig Dispense Refill  . aspirin 81 MG tablet Take 81 mg by mouth daily.      . calcium-vitamin D (OSCAL WITH D) 500-200 MG-UNIT per tablet Take 1 tablet by mouth 2 (two) times daily.      . celecoxib (CELEBREX) 200 MG capsule Take 200 mg by mouth daily.      . Cholecalciferol (VITAMIN D-3 PO) Take 2,000 mg by mouth daily.      [name] glimepiride (AMARYL) 4 MG tablet Take 4 mg by mouth daily.      . insulin glargine (LANTUS) 100 UNIT/ML injection Inject 23 Units into the skin at bedtime.      [name] lisinopril (PRINIVIL,ZESTRIL) 10 MG tablet Take 10 mg by mouth daily.      [name] LORazepam (ATIVAN) 0.5 MG tablet Take 0.5 mg by mouth as needed for anxiety (for CT skan).       . metFORMIN (GLUCOPHAGE) 1000 MG tablet Take 1,000 mg by mouth 2 (two) times daily with a meal.      . nitrofurantoin, macrocrystal-monohydrate, (MACROBID) 100 MG capsule Take 1 capsule (100 mg total) by mouth 2 (two) times daily.  10 capsule  0  . rosuvastatin (CRESTOR) 10 MG tablet Take 10 mg by mouth daily.       No current facility-administered medications for this visit.    SURGICAL HISTORY:  Past Surgical History  Procedure Laterality Date  . Thoracotomy      REVIEW OF SYSTEMS:  A comprehensive review of systems was negative.   PHYSICAL EXAMINATION: General appearance: alert, cooperative and no distress Head:  Normocephalic, without obvious abnormality, atraumatic Neck: no adenopathy Lymph nodes: Cervical, supraclavicular, and axillary nodes normal. Resp: clear to auscultation bilaterally Cardio: regular rate and rhythm, S1, S2 normal, no murmur, click, rub or gallop GI: soft, non-tender; bowel sounds normal; no masses,  no organomegaly Extremities: extremities normal, atraumatic, no cyanosis or edema  ECOG PERFORMANCE STATUS: 0 - Asymptomatic  Blood pressure 148/75, pulse 89, temperature 98.1 F (36.7 C), temperature source Oral, resp. rate 20, height _0  (1.549 m), weight 190 lb 11.2 oz  (86.501 kg).  LABORATORY DATA: Lab Results  Component Value Date   WBC 6.9 09/05/2013   HGB 15.0 09/05/2013   HCT 44.6 09/05/2013   MCV 88.5 09/05/2013   PLT 194 09/05/2013      Chemistry      Component Value Date/Time   NA 135 09/05/2013 0805   NA 141 02/28/2013 0757   NA 141 03/01/2012 1041   K 3.9 09/05/2013 0805   K 4.3 02/28/2013 0757   K 4.1 03/01/2012 1041   CL 95* 09/05/2013 0805   CL 104 08/30/2012 0908   CL 97* 03/01/2012 1041   CO2 27 09/05/2013 0805   CO2 24 02/28/2013 0757   CO2 31 03/01/2012 1041   BUN 18 09/05/2013 0805   BUN 19.7 02/28/2013 0757   BUN 13 03/01/2012 1041   CREATININE 0.59 09/05/2013 0805   CREATININE 0.9 02/28/2013 0757   CREATININE 0.7 03/01/2012 1041      Component Value Date/Time   CALCIUM 10.1 09/05/2013 0805   CALCIUM 9.7 02/28/2013 0757   CALCIUM 9.2 03/01/2012 1041   ALKPHOS 119* 09/05/2013 0805   ALKPHOS 95 02/28/2013 0757   ALKPHOS 110* 03/01/2012 1041   AST 23 09/05/2013 0805   AST 24 02/28/2013 0757   AST 34 03/01/2012 1041   ALT 30 09/05/2013 0805   ALT 24 02/28/2013 0757   ALT 35 03/01/2012 1041   BILITOT <0.2* 09/05/2013 0805   BILITOT 0.32 02/28/2013 0757   BILITOT 0.60 03/01/2012 1041       RADIOGRAPHIC STUDIES: Ct Chest W Contrast  09/05/2013   CLINICAL DATA:  Lung cancer.  Status post right wedge resection.  EXAM: CT CHEST WITH CONTRAST  TECHNIQUE: Multidetector CT imaging of the chest was performed during intravenous contrast administration.  CONTRAST:  24m OMNIPAQUE IOHEXOL 300 MG/ML  SOLN  COMPARISON:  02/28/2013  FINDINGS: There is no axillary lymphadenopathy. Mediastinal lymph nodes have increased in size in the interval. 7 mm short axis right paratracheal lymph node on today's study was 2 mm in short axis previously. 10 mm short axis AP window lymph node seen on image 15 today was 6 mm in short axis previously. 17 mm short axis subcarinal lymph node on image 23 was 12 mm in short axis previously. No evidence for hilar lymphadenopathy.  The  heart size is normal. Coronary artery calcification is noted. No pericardial or pleural effusion.  Lung windows show postsurgical scarring associated with the suture line in the right lung, unchanged in the interval. No pulmonary parenchymal nodule or mass in either lung.  Images which include the upper abdomen show no adrenal nodule or mass. No focal abnormality seen within the visualized portions of the liver or spleen.  Bone windows reveal no worrisome lytic or sclerotic osseous lesions.  IMPRESSION: Interval increase in size of the mediastinal lymph nodes with an abnormally enlarged subcarinal lymph node on today's study. No new lymph nodes are seen in the mediastinum, but nearly all of the  lymph nodes present previously have increased in size in the interval to a similar degree. While these changes may be reactive, metastatic disease cannot be excluded.   Electronically Signed   By: Misty Stanley M.D.   On: 09/05/2013 09:33    ASSESSMENT AND PLAN: This is a very pleasant 68 years old white female with a stage IIB mixed non-small cell lung cancer as well as small cell lung cancer diagnosed in June of 2000 and status post right lower lobe superior segmentectomy with lymph node dissection followed by 4 cycles of adjuvant chemotherapy and has been observation since April 2010 with no evidence for disease recurrence. I discussed the scan results with the patient today. It showed interval increase in size of mediastinal lymph nodes.  I recommended for her to continue close observation with repeat CT scan of the chest in 2 months for further evaluation of these enlarged mediastinal lymphadenopathy.  She was advised to call immediately if she has any concerning symptoms in the interval.  The patient voices understanding of current disease status and treatment options and is in agreement with the current care plan.  All questions were answered. The patient knows to call the clinic with any problems, questions  or concerns. We can certainly see the patient much sooner if necessary.  Disclaimer: This note was dictated with voice recognition software. Similar sounding words can inadvertently be transcribed and may not be corrected upon review.

## 2013-11-07 ENCOUNTER — Other Ambulatory Visit (HOSPITAL_BASED_OUTPATIENT_CLINIC_OR_DEPARTMENT_OTHER): Payer: BC Managed Care – PPO

## 2013-11-07 ENCOUNTER — Ambulatory Visit (HOSPITAL_COMMUNITY)
Admission: RE | Admit: 2013-11-07 | Discharge: 2013-11-07 | Disposition: A | Discharge status: Home / Self Care | Payer: BC Managed Care – PPO | Source: Ambulatory Visit | Attending: Internal Medicine | Admitting: Internal Medicine

## 2013-11-07 ENCOUNTER — Encounter (HOSPITAL_COMMUNITY): Payer: Self-pay

## 2013-11-07 DIAGNOSIS — C3490 Malignant neoplasm of unspecified part of unspecified bronchus or lung: Secondary | ICD-10-CM

## 2013-11-07 DIAGNOSIS — Z85118 Personal history of other malignant neoplasm of bronchus and lung: Secondary | ICD-10-CM

## 2013-11-07 LAB — COMPREHENSIVE METABOLIC PANEL
ALBUMIN: 3.7 g/dL (ref 3.5–5.2)
ALK PHOS: 121 U/L — AB (ref 39–117)
ALT: 30 U/L (ref 0–35)
AST: 25 U/L (ref 0–37)
BUN: 17 mg/dL (ref 6–23)
CO2: 28 mEq/L (ref 19–32)
Calcium: 9.7 mg/dL (ref 8.4–10.5)
Chloride: 90 mEq/L — ABNORMAL LOW (ref 96–112)
Creatinine, Ser: 0.64 mg/dL (ref 0.50–1.10)
GLUCOSE: 406 mg/dL — AB (ref 70–99)
POTASSIUM: 4.2 meq/L (ref 3.5–5.3)
SODIUM: 132 meq/L — AB (ref 135–145)
TOTAL PROTEIN: 6.8 g/dL (ref 6.0–8.3)
Total Bilirubin: 0.2 mg/dL — ABNORMAL LOW (ref 0.3–1.2)

## 2013-11-07 LAB — CBC WITH DIFFERENTIAL/PLATELET
BASO%: 0.4 % (ref 0.0–2.0)
Basophils Absolute: 0 10*3/uL (ref 0.0–0.1)
EOS ABS: 0.2 10*3/uL (ref 0.0–0.5)
EOS%: 3.6 % (ref 0.0–7.0)
HCT: 48.8 % — ABNORMAL HIGH (ref 34.8–46.6)
HGB: 16 g/dL — ABNORMAL HIGH (ref 11.6–15.9)
LYMPH%: 22 % (ref 14.0–49.7)
MCH: 29.1 pg (ref 25.1–34.0)
MCHC: 32.9 g/dL (ref 31.5–36.0)
MCV: 88.5 fL (ref 79.5–101.0)
MONO#: 0.7 10*3/uL (ref 0.1–0.9)
MONO%: 9.8 % (ref 0.0–14.0)
NEUT%: 64.2 % (ref 38.4–76.8)
NEUTROS ABS: 4.3 10*3/uL (ref 1.5–6.5)
Platelets: 207 10*3/uL (ref 145–400)
RBC: 5.51 10*6/uL — AB (ref 3.70–5.45)
RDW: 14.5 % (ref 11.2–14.5)
WBC: 6.7 10*3/uL (ref 3.9–10.3)
lymph#: 1.5 10*3/uL (ref 0.9–3.3)

## 2013-11-07 MED ORDER — IOHEXOL 300 MG/ML  SOLN
80.0000 mL | Freq: Once | INTRAMUSCULAR | Status: AC | PRN
  Administered 2013-11-07: 80 mL via INTRAVENOUS

## 2013-11-14 ENCOUNTER — Encounter: Payer: Self-pay | Admitting: Internal Medicine

## 2013-11-14 ENCOUNTER — Ambulatory Visit (HOSPITAL_BASED_OUTPATIENT_CLINIC_OR_DEPARTMENT_OTHER): Payer: BC Managed Care – PPO | Admitting: Internal Medicine

## 2013-11-14 ENCOUNTER — Telehealth: Payer: Self-pay | Admitting: Internal Medicine

## 2013-11-14 VITALS — BP 140/77 | HR 84 | Temp 98.1°F | Resp 18 | Ht 61.0 in | Wt 189.9 lb

## 2013-11-14 DIAGNOSIS — Z85118 Personal history of other malignant neoplasm of bronchus and lung: Secondary | ICD-10-CM

## 2013-11-14 DIAGNOSIS — C3490 Malignant neoplasm of unspecified part of unspecified bronchus or lung: Secondary | ICD-10-CM

## 2013-11-14 NOTE — Telephone Encounter (Signed)
gve the pt her nov 2015 appt calendar. Pt aware she will bge contacted with the ct scan appt.

## 2013-11-14 NOTE — Progress Notes (Signed)
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   PRIOR THERAPY:  1. Status post right lower lobe superior segmentectomy with lymph node dissection under the care of Dr. Arlyce Dice on July 20, 2008. 2. Status post adjuvant chemotherapy with carboplatin and etoposide for a total of 4 cycles. Last dose was given 10/25/2008.  CURRENT THERAPY: Observation.  CHEMOTHERAPY INTENT: Curative  CURRENT # OF CHEMOTHERAPY CYCLES: 0  CURRENT ANTIEMETICS: None  CURRENT SMOKING STATUS: Former smoker quit in 2008  ORAL CHEMOTHERAPY AND CONSENT: None  CURRENT BISPHOSPHONATES USE: None  PAIN MANAGEMENT: 0/10  NARCOTICS INDUCED CONSTIPATION: None  LIVING WILL AND CODE STATUS: No code Blue.   INTERVAL HISTORY: Amy Shepard 68 y.o. female returns to the clinic today for followup visit. The patient is feeling fine today with no specific complaints. She was noted on the previous scan to have an enlarging mediastinal lymph nodes.  She denied having any significant chest pain, shortness of breath, cough or hemoptysis. She denied having any fever or chills. She has no nausea or vomiting. The patient denied having any significant weight loss or night sweats. She had repeat CT scan of the chest performed recently and she is here for evaluation and discussion of her scan results.  MEDICAL HISTORY: Past Medical History  Diagnosis Date  . Cancer     lung ca  . Diabetes mellitus   . COPD (chronic obstructive pulmonary disease)   . Hypercholesteremia   . Lung cancer   . Depression     ALLERGIES:  is allergic to amoxicillin.  MEDICATIONS:  Current Outpatient Prescriptions  Medication Sig Dispense Refill  . aspirin 81 MG tablet Take  81 mg by mouth daily.      . calcium-vitamin D (OSCAL WITH D) 500-200 MG-UNIT per tablet Take 1 tablet by mouth 2 (two) times daily.      . celecoxib (CELEBREX) 200 MG capsule Take 200 mg by mouth daily.      . Cholecalciferol (VITAMIN D-3 PO) Take 2,000 mg by mouth daily.      [name] glimepiride (AMARYL) 4 MG tablet Take 4 mg by mouth daily.      . insulin glargine (LANTUS) 100 UNIT/ML injection Inject 23 Units into the skin at bedtime.      [name] lisinopril (PRINIVIL,ZESTRIL) 10 MG tablet Take 10 mg by mouth daily.      [name] LORazepam (ATIVAN) 0.5 MG tablet Take 0.5 mg by mouth as needed for anxiety (for CT skan).       . metFORMIN (GLUCOPHAGE) 1000 MG tablet Take 1,000 mg by mouth 2 (two) times daily with a meal.      . nitrofurantoin, macrocrystal-monohydrate, (MACROBID) 100 MG capsule Take 1 capsule (100 mg total) by mouth 2 (two) times daily.  10 capsule  0  . rosuvastatin (CRESTOR) 10 MG tablet Take 10 mg by mouth daily.       No current facility-administered medications for this visit.    SURGICAL HISTORY:  Past Surgical History  Procedure Laterality Date  . Thoracotomy      REVIEW OF SYSTEMS:  A comprehensive review of systems was negative.   PHYSICAL EXAMINATION: General appearance: alert, cooperative and no distress Head: Normocephalic, without obvious abnormality, atraumatic Neck: no adenopathy  Lymph nodes: Cervical, supraclavicular, and axillary nodes normal. Resp: clear to auscultation bilaterally Cardio: regular rate and rhythm, S1, S2 normal, no murmur, click, rub or gallop GI: soft, non-tender; bowel sounds normal; no masses,  no organomegaly Extremities: extremities normal, atraumatic, no cyanosis or edema  ECOG PERFORMANCE STATUS: 0 - Asymptomatic  Blood pressure 140/77, pulse 84, temperature 98.1 F (36.7 C), temperature source Oral, resp. rate 18, height _0  (1.549 m), weight 189 lb 14.4 oz (86.138 kg), SpO2 96.00%.  LABORATORY DATA: Lab Results  Component Value Date    WBC 6.7 11/07/2013   HGB 16.0* 11/07/2013   HCT 48.8* 11/07/2013   MCV 88.5 11/07/2013   PLT 207 11/07/2013      Chemistry      Component Value Date/Time   NA 132* 11/07/2013 0816   NA 141 02/28/2013 0757   NA 141 03/01/2012 1041   K 4.2 11/07/2013 0816   K 4.3 02/28/2013 0757   K 4.1 03/01/2012 1041   CL 90* 11/07/2013 0816   CL 104 08/30/2012 0908   CL 97* 03/01/2012 1041   CO2 28 11/07/2013 0816   CO2 24 02/28/2013 0757   CO2 31 03/01/2012 1041   BUN 17 11/07/2013 0816   BUN 19.7 02/28/2013 0757   BUN 13 03/01/2012 1041   CREATININE 0.64 11/07/2013 0816   CREATININE 0.9 02/28/2013 0757   CREATININE 0.7 03/01/2012 1041      Component Value Date/Time   CALCIUM 9.7 11/07/2013 0816   CALCIUM 9.7 02/28/2013 0757   CALCIUM 9.2 03/01/2012 1041   ALKPHOS 121* 11/07/2013 0816   ALKPHOS 95 02/28/2013 0757   ALKPHOS 110* 03/01/2012 1041   AST 25 11/07/2013 0816   AST 24 02/28/2013 0757   AST 34 03/01/2012 1041   ALT 30 11/07/2013 0816   ALT 24 02/28/2013 0757   ALT 35 03/01/2012 1041   BILITOT 0.2* 11/07/2013 0816   BILITOT 0.32 02/28/2013 0757   BILITOT 0.60 03/01/2012 1041       RADIOGRAPHIC STUDIES: Ct Chest W Contrast  11/07/2013   CLINICAL DATA:  Lung cancer, wedge resection.  Chemotherapy complete  EXAM: CT CHEST WITH CONTRAST  TECHNIQUE: Multidetector CT imaging of the chest was performed during intravenous contrast administration.  CONTRAST:  110m OMNIPAQUE IOHEXOL 300 MG/ML  SOLN  COMPARISON:  CT 09/05/2013  FINDINGS: No axillary or supraclavicular lymphadenopathy. Small paratracheal lymph nodes are not pathologic by size criteria. Subcarinal lymph node measures 14 mm short axis compared to 17 mm on prior. Coronary calcifications are present. No central pulmonary embolism.  Review of the lung parenchyma demonstrates postsurgical change in the right lower lobe. No evidence of nodularity along the resection margin. Airways are normal.  Limited view of the upper abdomen demonstrates normal adrenal  glands. No focal hepatic lesion limited view of liver. No aggressive osseous lesion.  IMPRESSION: 1. No evidence of lung cancer recurrence. 2. Stable postop change in the right lower lobe. 3. Stable to mild decrease in mediastinal lymphadenopathy.   Electronically Signed   By: SSuzy BouchardM.D.   On: 11/07/2013 10:00   ASSESSMENT AND PLAN: This is a very pleasant 68years old white female with a stage IIB mixed non-small cell lung cancer as well as small cell lung cancer diagnosed in June of 2000 and status post right lower lobe superior segmentectomy with lymph node dissection followed by 4 cycles of adjuvant chemotherapy and has been observation since April 2010 with no evidence for disease recurrence. The recent  CT scan of the chest showed no evidence for disease recurrence. I discussed the scan results with the patient and recommended for her to continue on observation with repeat CT scan of the chest in 6 months. She was advised to call immediately if she has any concerning symptoms in the interval.  The patient voices understanding of current disease status and treatment options and is in agreement with the current care plan.  All questions were answered. The patient knows to call the clinic with any problems, questions or concerns. We can certainly see the patient much sooner if necessary.  Disclaimer: This note was dictated with voice recognition software. Similar sounding words can inadvertently be transcribed and may not be corrected upon review.

## 2014-05-18 ENCOUNTER — Ambulatory Visit (HOSPITAL_COMMUNITY)
Admission: RE | Admit: 2014-05-18 | Discharge: 2014-05-18 | Disposition: A | Discharge status: Home / Self Care | Payer: BC Managed Care – PPO | Source: Ambulatory Visit | Attending: Internal Medicine | Admitting: Internal Medicine

## 2014-05-18 ENCOUNTER — Other Ambulatory Visit (HOSPITAL_BASED_OUTPATIENT_CLINIC_OR_DEPARTMENT_OTHER): Payer: Medicare Other

## 2014-05-18 ENCOUNTER — Encounter (HOSPITAL_COMMUNITY): Payer: Self-pay

## 2014-05-18 DIAGNOSIS — R05 Cough: Secondary | ICD-10-CM | POA: Diagnosis not present

## 2014-05-18 DIAGNOSIS — K760 Fatty (change of) liver, not elsewhere classified: Secondary | ICD-10-CM | POA: Diagnosis not present

## 2014-05-18 DIAGNOSIS — Z85118 Personal history of other malignant neoplasm of bronchus and lung: Secondary | ICD-10-CM | POA: Insufficient documentation

## 2014-05-18 DIAGNOSIS — I517 Cardiomegaly: Secondary | ICD-10-CM | POA: Diagnosis not present

## 2014-05-18 DIAGNOSIS — C3490 Malignant neoplasm of unspecified part of unspecified bronchus or lung: Secondary | ICD-10-CM

## 2014-05-18 DIAGNOSIS — I700 Atherosclerosis of aorta: Secondary | ICD-10-CM | POA: Insufficient documentation

## 2014-05-18 DIAGNOSIS — Z9221 Personal history of antineoplastic chemotherapy: Secondary | ICD-10-CM | POA: Diagnosis not present

## 2014-05-18 DIAGNOSIS — I2510 Atherosclerotic heart disease of native coronary artery without angina pectoris: Secondary | ICD-10-CM | POA: Insufficient documentation

## 2014-05-18 DIAGNOSIS — D7389 Other diseases of spleen: Secondary | ICD-10-CM | POA: Insufficient documentation

## 2014-05-18 LAB — COMPREHENSIVE METABOLIC PANEL (CC13)
ALBUMIN: 3.4 g/dL — AB (ref 3.5–5.0)
ALK PHOS: 110 U/L (ref 40–150)
ALT: 30 U/L (ref 0–55)
AST: 23 U/L (ref 5–34)
Anion Gap: 10 mEq/L (ref 3–11)
BILIRUBIN TOTAL: 0.32 mg/dL (ref 0.20–1.20)
BUN: 19.5 mg/dL (ref 7.0–26.0)
CO2: 26 mEq/L (ref 22–29)
CREATININE: 0.8 mg/dL (ref 0.6–1.1)
Calcium: 9.2 mg/dL (ref 8.4–10.4)
Chloride: 101 mEq/L (ref 98–109)
Glucose: 309 mg/dl — ABNORMAL HIGH (ref 70–140)
POTASSIUM: 4.1 meq/L (ref 3.5–5.1)
Sodium: 137 mEq/L (ref 136–145)
Total Protein: 6.6 g/dL (ref 6.4–8.3)

## 2014-05-18 LAB — CBC WITH DIFFERENTIAL/PLATELET
BASO%: 0.8 % (ref 0.0–2.0)
BASOS ABS: 0 10*3/uL (ref 0.0–0.1)
EOS ABS: 0.2 10*3/uL (ref 0.0–0.5)
EOS%: 3 % (ref 0.0–7.0)
HEMATOCRIT: 43.4 % (ref 34.8–46.6)
HEMOGLOBIN: 14.3 g/dL (ref 11.6–15.9)
LYMPH%: 17.3 % (ref 14.0–49.7)
MCH: 29.2 pg (ref 25.1–34.0)
MCHC: 32.9 g/dL (ref 31.5–36.0)
MCV: 88.7 fL (ref 79.5–101.0)
MONO#: 0.8 10*3/uL (ref 0.1–0.9)
MONO%: 13 % (ref 0.0–14.0)
NEUT%: 65.9 % (ref 38.4–76.8)
NEUTROS ABS: 3.9 10*3/uL (ref 1.5–6.5)
PLATELETS: 190 10*3/uL (ref 145–400)
RBC: 4.9 10*6/uL (ref 3.70–5.45)
RDW: 14.4 % (ref 11.2–14.5)
WBC: 5.9 10*3/uL (ref 3.9–10.3)
lymph#: 1 10*3/uL (ref 0.9–3.3)

## 2014-05-18 MED ORDER — IOHEXOL 300 MG/ML  SOLN
80.0000 mL | Freq: Once | INTRAMUSCULAR | Status: AC | PRN
  Administered 2014-05-18: 80 mL via INTRAVENOUS

## 2014-05-25 ENCOUNTER — Encounter: Payer: Self-pay | Admitting: Internal Medicine

## 2014-05-25 ENCOUNTER — Telehealth: Payer: Self-pay | Admitting: Internal Medicine

## 2014-05-25 ENCOUNTER — Ambulatory Visit (HOSPITAL_BASED_OUTPATIENT_CLINIC_OR_DEPARTMENT_OTHER): Payer: BC Managed Care – PPO | Admitting: Internal Medicine

## 2014-05-25 VITALS — BP 141/75 | HR 88 | Temp 98.0°F | Resp 17 | Ht 61.0 in | Wt 188.0 lb

## 2014-05-25 DIAGNOSIS — C3431 Malignant neoplasm of lower lobe, right bronchus or lung: Secondary | ICD-10-CM | POA: Insufficient documentation

## 2014-05-25 DIAGNOSIS — Z85118 Personal history of other malignant neoplasm of bronchus and lung: Secondary | ICD-10-CM

## 2014-05-25 NOTE — Telephone Encounter (Addendum)
Pt confirmed labs/ov per 11/12 POF, gave pt AVS, also scheduled pt for radiation...Marland KitchenMarland KitchenMarland Kitchen KJ

## 2014-05-25 NOTE — Progress Notes (Signed)
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   PRIOR THERAPY:  1. Status post right lower lobe superior segmentectomy with lymph node dissection under the care of Dr. Arlyce Dice on July 20, 2008. 2. Status post adjuvant chemotherapy with carboplatin and etoposide for a total of 4 cycles. Last dose was given 10/25/2008.  CURRENT THERAPY: Observation.  CHEMOTHERAPY INTENT: Curative  CURRENT # OF CHEMOTHERAPY CYCLES: 0  CURRENT ANTIEMETICS: None  CURRENT SMOKING STATUS: Former smoker quit in 2008  ORAL CHEMOTHERAPY AND CONSENT: None  CURRENT BISPHOSPHONATES USE: None  PAIN MANAGEMENT: 0/10  NARCOTICS INDUCED CONSTIPATION: None  LIVING WILL AND CODE STATUS: No code Blue.   INTERVAL HISTORY: Amy Shepard 68 y.o. female returns to the clinic today for followup visit. The patient is feeling fine today with no specific complaints. She was noted on the previous scan to have an enlarging mediastinal lymph nodes and these have been waxing and weaning over several scans.  She denied having any significant chest pain, shortness of breath, cough or hemoptysis. She denied having any fever or chills. She has no nausea or vomiting. The patient denied having any significant weight loss or night sweats. Her oxygen saturation was low earlier today but the patient has allergy and she usually clear her throat and chest from mucous in the morning and she feels much better after that. She had repeat CT scan of the chest performed recently and she is here for evaluation and discussion of her scan results.  MEDICAL HISTORY: Past Medical History  Diagnosis Date  . Cancer     lung ca  . COPD (chronic obstructive pulmonary disease)   .  Hypercholesteremia   . Lung cancer   . Depression   . Diabetes mellitus     januvia    ALLERGIES:  is allergic to amoxicillin.  MEDICATIONS:  Current Outpatient Prescriptions  Medication Sig Dispense Refill  . aspirin 81 MG tablet Take 81 mg by mouth daily.    . calcium-vitamin D (OSCAL WITH D) 500-200 MG-UNIT per tablet Take 1 tablet by mouth 2 (two) times daily.    . celecoxib (CELEBREX) 200 MG capsule Take 200 mg by mouth daily.    [name] glimepiride (AMARYL) 4 MG tablet Take 4 mg by mouth daily.    [name] lisinopril (PRINIVIL,ZESTRIL) 10 MG tablet Take 10 mg by mouth daily.    [name] LORazepam (ATIVAN) 0.5 MG tablet Take 0.5 mg by mouth as needed for anxiety (for CT skan).     . rosuvastatin (CRESTOR) 10 MG tablet Take 10 mg by mouth daily.    . sitaGLIPtin-metformin (JANUMET) 50-500 MG per tablet Take 1 tablet by mouth 2 (two) times daily with a meal. Pt is unsure of dose     No current facility-administered medications for this visit.    SURGICAL HISTORY:  Past Surgical History  Procedure Laterality Date  . Thoracotomy      REVIEW OF SYSTEMS:  A comprehensive review of systems was negative.   PHYSICAL EXAMINATION: General appearance: alert, cooperative and no distress Head: Normocephalic, without obvious abnormality, atraumatic Neck: no adenopathy Lymph nodes: Cervical, supraclavicular, and axillary nodes normal. Resp: clear to auscultation bilaterally Cardio: regular rate and rhythm, S1, S2 normal, no murmur, click, rub  or gallop GI: soft, non-tender; bowel sounds normal; no masses,  no organomegaly Extremities: extremities normal, atraumatic, no cyanosis or edema  ECOG PERFORMANCE STATUS: 0 - Asymptomatic  Blood pressure 141/75, pulse 88, temperature 98 F (36.7 C), temperature source Oral, resp. rate 17, height _0  (1.549 m), weight 188 lb (85.276 kg), SpO2 98 %.  LABORATORY DATA: Lab Results  Component Value Date   WBC 5.9 05/18/2014   HGB 14.3 05/18/2014   HCT 43.4  05/18/2014   MCV 88.7 05/18/2014   PLT 190 05/18/2014      Chemistry      Component Value Date/Time   NA 137 05/18/2014 0831   NA 132* 11/07/2013 0816   NA 141 03/01/2012 1041   K 4.1 05/18/2014 0831   K 4.2 11/07/2013 0816   K 4.1 03/01/2012 1041   CL 90* 11/07/2013 0816   CL 104 08/30/2012 0908   CL 97* 03/01/2012 1041   CO2 26 05/18/2014 0831   CO2 28 11/07/2013 0816   CO2 31 03/01/2012 1041   BUN 19.5 05/18/2014 0831   BUN 17 11/07/2013 0816   BUN 13 03/01/2012 1041   CREATININE 0.8 05/18/2014 0831   CREATININE 0.64 11/07/2013 0816   CREATININE 0.7 03/01/2012 1041      Component Value Date/Time   CALCIUM 9.2 05/18/2014 0831   CALCIUM 9.7 11/07/2013 0816   CALCIUM 9.2 03/01/2012 1041   ALKPHOS 110 05/18/2014 0831   ALKPHOS 121* 11/07/2013 0816   ALKPHOS 110* 03/01/2012 1041   AST 23 05/18/2014 0831   AST 25 11/07/2013 0816   AST 34 03/01/2012 1041   ALT 30 05/18/2014 0831   ALT 30 11/07/2013 0816   ALT 35 03/01/2012 1041   BILITOT 0.32 05/18/2014 0831   BILITOT 0.2* 11/07/2013 0816   BILITOT 0.60 03/01/2012 1041       RADIOGRAPHIC STUDIES: Ct Chest W Contrast  05/18/2014   CLINICAL DATA:  68 year old female with history of lung cancer diagnosed in February 2009. Chemotherapy complete. Patient presenting with recent history of cough.  EXAM: CT CHEST WITH CONTRAST  TECHNIQUE: Multidetector CT imaging of the chest was performed during intravenous contrast administration.  CONTRAST:  35m OMNIPAQUE IOHEXOL 300 MG/ML  SOLN  COMPARISON:  Chest CT 11/07/2013.  FINDINGS: Mediastinum: Heart size is borderline enlarged. Small amount of pericardial fluid and/or thickening, unlikely to be of any hemodynamic significance at this time, and unchanged compared to the prior study. No associated pericardial calcification. There is atherosclerosis of the thoracic aorta, the great vessels of the mediastinum and the coronary arteries, including calcified atherosclerotic plaque in the  left main, left anterior descending, left circumflex and right coronary arteries. Numerous borderline enlarged and mildly enlarged lymph nodes, generally increased in size compared to the prior examination. Specific examples include an enlarging 16 mm short axis subcarinal node, 1 cm short axis low left paratracheal lymph node, 11 mm short axis AP window lymph node, and enlarging 9 mm short axis low right peritracheal lymph node. Esophagus is unremarkable in appearance.  Lungs/Pleura: Postoperative changes of wedge resection in the right lower lobe are again noted. Chronic architectural distortion in the periphery of the right lung is unchanged, compatible with mild postoperative scarring. There is a new linear opacity in the anterior aspect of left upper lobe, presumably subsegmental atelectasis. Mild chronic scarring in the inferior segment of the lingula is unchanged. No suspicious appearing pulmonary nodules or masses. No acute consolidative airspace disease. No pleural effusions.  Upper Abdomen: Diffuse  decreased attenuation throughout the hepatic parenchyma, compatible with hepatic steatosis. Tiny calcified granulomas in the spleen incidentally noted.  Musculoskeletal: There are no aggressive appearing lytic or blastic lesions noted in the visualized portions of the skeleton.  IMPRESSION: 1. Slight interval enlargement of numerous borderline enlarged and mildly enlarged mediastinal lymph nodes, as detailed above. Notably, when compared to more remote prior CT scan 09/05/2013, these lymph nodes appear very similar. This person appears to have some intermittently waxing and waning mediastinal lymphadenopathy. While the possibility of recurrent neoplasm is not excluded, recurrence is not strongly favored at this time, particularly given the patient's history of recent cough, which could indicate that this is reactive lymphadenopathy. Additionally, no lung lesion is noted to suggest local recurrence of disease.  Clinical correlation is recommended. These findings could be further evaluated with PET-CT if clinically appropriate. Alternatively, attention at time of routine followup would be recommended. 2. Atherosclerosis, including left main and 3 vessel coronary artery disease. Please note that although the presence of coronary artery calcium documents the presence of coronary artery disease, the severity of this disease and any potential stenosis cannot be assessed on this non-gated CT examination. Assessment for potential risk factor modification, dietary therapy or pharmacologic therapy may be warranted, if clinically indicated. 3. Hepatic steatosis. 4. Additional incidental findings, as above.   Electronically Signed   By: Vinnie Langton M.D.   On: 05/18/2014 09:54   ASSESSMENT AND PLAN: This is a very pleasant 68 years old white female with a stage IIB mixed non-small cell lung cancer as well as small cell lung cancer diagnosed in June of 2010 status post right lower lobe superior segmentectomy with lymph node dissection followed by 4 cycles of adjuvant chemotherapy and has been observation since April 2010 with no evidence for disease recurrence. The recent CT scan of the chest showed no evidence for disease recurrence. I discussed the scan results with the patient and recommended for her to continue on observation with repeat CT scan of the chest in 6 months. She was advised to call immediately if she has any concerning symptoms in the interval.  The patient voices understanding of current disease status and treatment options and is in agreement with the current care plan.  All questions were answered. The patient knows to call the clinic with any problems, questions or concerns. We can certainly see the patient much sooner if necessary.  Disclaimer: This note was dictated with voice recognition software. Similar sounding words can inadvertently be transcribed and may not be corrected upon review.

## 2014-06-01 ENCOUNTER — Ambulatory Visit (INDEPENDENT_AMBULATORY_CARE_PROVIDER_SITE_OTHER): Payer: BC Managed Care – PPO | Admitting: Cardiology

## 2014-06-01 ENCOUNTER — Encounter: Payer: Self-pay | Admitting: Cardiology

## 2014-06-01 ENCOUNTER — Encounter: Payer: Self-pay | Admitting: *Deleted

## 2014-06-01 VITALS — BP 122/76 | HR 75 | Ht 61.0 in | Wt 181.0 lb

## 2014-06-01 DIAGNOSIS — R600 Localized edema: Secondary | ICD-10-CM

## 2014-06-01 DIAGNOSIS — E785 Hyperlipidemia, unspecified: Secondary | ICD-10-CM

## 2014-06-01 DIAGNOSIS — R931 Abnormal findings on diagnostic imaging of heart and coronary circulation: Secondary | ICD-10-CM

## 2014-06-01 DIAGNOSIS — R938 Abnormal findings on diagnostic imaging of other specified body structures: Secondary | ICD-10-CM

## 2014-06-01 DIAGNOSIS — R079 Chest pain, unspecified: Secondary | ICD-10-CM

## 2014-06-01 DIAGNOSIS — Z01812 Encounter for preprocedural laboratory examination: Secondary | ICD-10-CM

## 2014-06-01 DIAGNOSIS — I10 Essential (primary) hypertension: Secondary | ICD-10-CM

## 2014-06-01 DIAGNOSIS — R0609 Other forms of dyspnea: Secondary | ICD-10-CM

## 2014-06-01 DIAGNOSIS — R0600 Dyspnea, unspecified: Secondary | ICD-10-CM

## 2014-06-01 NOTE — Patient Instructions (Signed)
Your physician recommends that you continue on your current medications as directed. Please refer to the Current Medication list given to you today.   Your physician recommends that you return for lab work in: [place] ON 06/23/14 AT [facility] (CMP, PT/INR, AND CBC W DIFF)  Your physician has requested that you have a cardiac catheterization. Cardiac catheterization is used to diagnose and/or treat various heart conditions. Doctors may recommend this procedure for a number of different reasons. The most common reason is to evaluate chest pain. Chest pain can be a symptom of coronary artery disease (CAD), and cardiac catheterization can show whether plaque is narrowing or blocking your heart's arteries. This procedure is also used to evaluate the valves, as well as measure the blood flow and oxygen levels in different parts of your heart. For further information please visit [email or web address]. Please follow instruction sheet, as given.  THIS WILL BE SCHEDULED FOR June 26, 2014 WITH DR COOPER AT 9 AM AT [place] AT 7 AM AT SHORT STAY.    Your physician has requested that you have an echocardiogram. Echocardiography is a painless test that uses sound waves to create images of your heart. It provides your doctor with information about the size and shape of your heart and how well your heart's chambers and valves are working. This procedure takes approximately one hour. There are no restrictions for this procedure. SCHEDULE THIS AFTER YOUR CATH    FOLLOW-UP WITH DR NELSON WILL BE BASED OFF OF YOUR PROCEDURE AND TEST RESULTS

## 2014-06-01 NOTE — Progress Notes (Signed)
Patient ID: Amy Shepard, female   DOB: [birth year], 68 y.o.   MRN: [id number]     Patient Name: Amy Shepard Date of Encounter: 06/01/2014  Primary Care Provider:  Wynelle Fanny Primary Cardiologist:  Dorothy Spark  Problem List   Past Medical History  Diagnosis Date  . Cancer     lung ca  . COPD (chronic obstructive pulmonary disease)   . Hypercholesteremia   . Lung cancer   . Depression   . Diabetes mellitus     [name]   Past Surgical History  Procedure Laterality Date  . Thoracotomy      Allergies  Allergies  Allergen Reactions  . 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 The recent CT scan of the chest showed no evidence for disease recurrence.  The patient is being referred to [place] as her follow-up chest CT showed significant calcification in all 3 coronary arteries as well as pericardial effusion and pericardial thickening around the apex of the heart. 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 She also states that she has on and off lower extremity edema. She denies any palpitations or syncope. No orthopnea or personal nocturnal dyspnea. She has family history of premature coronary artery disease her father had myocardial infarction at age of 5 he still alive.  Her risk factors include insulin-dependent diabetes, hypertension, hyperlipidemia and obesity.  Home Medications  Prior to Admission medications   Medication Sig Start Date End Date Taking?  Authorizing Provider  aspirin 81 MG tablet Take 81 mg by mouth daily.   Yes Historical Provider, MD  calcium-vitamin D (OSCAL WITH D) 500-200 MG-UNIT per tablet Take 1 tablet by mouth 2 (two) times daily.   Yes Historical Provider, MD  celecoxib (CELEBREX) 200 MG capsule Take 200 mg by mouth daily.   Yes Historical Provider, MD  glimepiride (AMARYL) 4 MG tablet Take 4 mg by mouth daily.   Yes Historical Provider, MD  lisinopril (PRINIVIL,ZESTRIL) 10 MG tablet Take 10 mg by mouth daily.   Yes Historical Provider, MD  LORazepam (ATIVAN) 0.5 MG tablet Take 0.5 mg by mouth as needed for anxiety (for CT skan).    Yes Historical Provider, MD  rosuvastatin (CRESTOR) 10 MG tablet Take 10 mg by mouth daily.   Yes Historical Provider, MD  sitaGLIPtin-metformin (JANUMET) 50-500 MG per tablet Take 1 tablet by mouth 2 (two) times daily with a meal. Pt is unsure of dose   Yes Historical Provider, MD    Family History  Family History  Problem Relation Age of Onset  . Diabetes Father   . Heart failure Father   . Cancer Sister   . Diabetes Brother   . Heart failure Brother     Social History  History   Social History  . Marital Status: Single    Spouse Name: N/A    Number of Children: N/A  . Years of Education: N/A   Occupational History  . Not on file.   Social History Main  Topics  . Smoking status: Former Smoker -- 1.00 packs/day for 30 years    Quit date: 07/14/2006  . Smokeless tobacco: Never Used  . Alcohol Use: No  . Drug Use: No  . Sexual Activity: Not on file   Other Topics Concern  . Not on file   Social History Narrative     Review of Systems, as per HPI, otherwise negative General:  No chills, fever, night sweats or weight changes.  Cardiovascular:  No chest pain, dyspnea on exertion, edema, orthopnea, palpitations, paroxysmal nocturnal dyspnea. Dermatological: No rash, lesions/masses Respiratory: No cough, dyspnea Urologic: No hematuria, dysuria Abdominal:   No  nausea, vomiting, diarrhea, bright red blood per rectum, melena, or hematemesis Neurologic:  No visual changes, wkns, changes in mental status. All other systems reviewed and are otherwise negative except as noted above.  Physical Exam  Blood pressure 122/76, pulse 75, height _0  (1.549 m), weight 181 lb (82.101 kg).  General: Pleasant, NAD Psych: Normal affect. Neuro: Alert and oriented X 3. Moves all extremities spontaneously. HEENT: Normal  Neck: Supple without bruits or JVD. Lungs:  Resp regular and unlabored, CTA. Heart: RRR no s3, s4, or murmurs. Abdomen: Soft, non-tender, non-distended, BS + x 4.  Extremities: No clubbing, cyanosis, 1+ non-pitting edema B/L. DP/PT/Radials 2+ and equal bilaterally.  Labs:  No results for input(s): CKTOTAL, CKMB, TROPONINI in the last 72 hours. Lab Results  Component Value Date   WBC 5.9 05/18/2014   HGB 14.3 05/18/2014   HCT 43.4 05/18/2014   MCV 88.7 05/18/2014   PLT 190 05/18/2014    No results found for: DDIMER Invalid input(s): POCBNP    Component Value Date/Time   NA 137 05/18/2014 0831   NA 132* 11/07/2013 0816   NA 141 03/01/2012 1041   K 4.1 05/18/2014 0831   K 4.2 11/07/2013 0816   K 4.1 03/01/2012 1041   CL 90* 11/07/2013 0816   CL 104 08/30/2012 0908   CL 97* 03/01/2012 1041   CO2 26 05/18/2014 0831   CO2 28 11/07/2013 0816   CO2 31 03/01/2012 1041   GLUCOSE 309* 05/18/2014 0831   GLUCOSE 406* 11/07/2013 0816   GLUCOSE 191* 08/30/2012 0908   GLUCOSE 328* 03/01/2012 1041   BUN 19.5 05/18/2014 0831   BUN 17 11/07/2013 0816   BUN 13 03/01/2012 1041   CREATININE 0.8 05/18/2014 0831   CREATININE 0.64 11/07/2013 0816   CREATININE 0.7 03/01/2012 1041   CALCIUM 9.2 05/18/2014 0831   CALCIUM 9.7 11/07/2013 0816   CALCIUM 9.2 03/01/2012 1041   PROT 6.6 05/18/2014 0831   PROT 6.8 11/07/2013 0816   PROT 7.6 03/01/2012 1041   ALBUMIN 3.4* 05/18/2014 0831   ALBUMIN 3.7 11/07/2013 0816   AST 23 05/18/2014 0831   AST  25 11/07/2013 0816   AST 34 03/01/2012 1041   ALT 30 05/18/2014 0831   ALT 30 11/07/2013 0816   ALT 35 03/01/2012 1041   ALKPHOS 110 05/18/2014 0831   ALKPHOS 121* 11/07/2013 0816   ALKPHOS 110* 03/01/2012 1041   BILITOT 0.32 05/18/2014 0831   BILITOT 0.2* 11/07/2013 0816   BILITOT 0.60 03/01/2012 1041   GFRNONAA 50* 02/10/2013 1055   GFRAA 57* 02/10/2013 1055   No results found for: CHOL, HDL, LDLCALC, TRIG  Accessory Clinical Findings  Echocardiogram - 2008 Overall left ventricular systolic function was normal. Left    ventricular ejection fraction was estimated to be 65 %. There    was no diagnostic evidence of  left ventricular regional wall    motion abnormalities. - The left atrium was mildly dilated. - The study is technically difficult. However, no obvious    vegetations are seen.  ECG - sinus rhythm with PVCs, right axis deviation, abnormal EKG.    Assessment & Plan  68 year old female  1. Exertional dyspnea and epigastric pain - risk factors include poorly controlled insulin-dependent diabetes (HbA1c 14.2%), hypertension, hyperlipidemia, obesity, family history of premature coronary artery disease. Chest CT performed as follow-up surveillance for lung cancer showed significant calcification in all 3 coronary arteries predominantly in LAD. We will schedule the patient for left cardiac catheterization. Her creatinine is normal. We will continue aspirin, rosuvastatin and lisinopril.  2. Pericardial effusion mild circumferential on chest CT - we will order an echocardiogram to evaluate the amount, and also to evaluate for systolic and diastolic function and regional wall motion abnormalities.  3. B/L LE edema - echo for systolic and diastolic function pending.  4. Hypertension - controlled  5. Hyperlipidemia - on Crestor  6. Diabetes mellitus - poorly controlled with hemoglobin A1c 14.2%, followed by primary care physician.  7. Obesity - if  you rule out obstructive coronary artery disease we will encourage to exercise.  Follow up in 1 month.   Dorothy Spark, MD, [facility] 06/01/2014, 11:34 AM

## 2014-06-23 ENCOUNTER — Telehealth: Payer: Self-pay | Admitting: Cardiology

## 2014-06-23 ENCOUNTER — Other Ambulatory Visit: Payer: BC Managed Care – PPO

## 2014-06-23 NOTE — Telephone Encounter (Signed)
Left message to call back

## 2014-06-23 NOTE — Telephone Encounter (Signed)
New message     Pt want to cancel cath procedure.  She will call back at a later date and reschedule

## 2014-06-23 NOTE — Telephone Encounter (Signed)
Tried again to reach pt. Left message to call office on Monday.  I spoke with Crystal in cath lab and made her aware pt may want to cancel procedure.

## 2014-06-26 ENCOUNTER — Ambulatory Visit (HOSPITAL_COMMUNITY)
Admission: RE | Admit: 2014-06-26 | Payer: BC Managed Care – PPO | Source: Ambulatory Visit | Admitting: Cardiovascular Disease

## 2014-06-26 ENCOUNTER — Encounter (HOSPITAL_COMMUNITY): Admission: RE | Payer: Self-pay | Source: Ambulatory Visit

## 2014-06-26 SURGERY — LEFT HEART CATHETERIZATION WITH CORONARY ANGIOGRAM
Anesthesia: LOCAL

## 2014-06-27 NOTE — Telephone Encounter (Signed)
In follow up LM for  pt to call to see if wanted to reschedule heart cath that was scheduled for yesterday 12/14.

## 2014-07-03 ENCOUNTER — Encounter: Payer: Self-pay | Admitting: *Deleted

## 2014-07-03 ENCOUNTER — Telehealth: Payer: Self-pay | Admitting: Cardiology

## 2014-07-03 NOTE — Addendum Note (Signed)
Addended by: Dorothy Spark on: 07/03/2014 02:19 PM   Modules accepted: Orders

## 2014-07-03 NOTE — Telephone Encounter (Signed)
New message        Pt ready to sched cath

## 2014-07-03 NOTE — Telephone Encounter (Signed)
Pt calling to reschedule cath that was cancelled for 06/26/14 with Dr Burt Knack.  Rescheduled the pts cath at requested date for Wednesday 07/12/14 at 0830 with Dr Tamala Julian.  Informed the pt that she is to be at the [facility] at [place] for her cath. Rescheduled pre-cath labs at our office for 07/10/14 as requested by the pt, for she will be here for a echo at our office as well. -PT/INR, CBC W DIFF, CMET.  Informed the pt that when she comes in for her labs and echo on 12/28, there will be an instruction letter for her to pick up at the front desk.  Will send Dr [name] a message to place hospital orders in for rescheduled cath. Pt verbalized understanding and agrees with this plan.

## 2014-07-10 ENCOUNTER — Other Ambulatory Visit: Payer: BC Managed Care – PPO

## 2014-07-10 ENCOUNTER — Ambulatory Visit (HOSPITAL_COMMUNITY): Payer: BC Managed Care – PPO | Attending: Cardiology | Admitting: Radiology

## 2014-07-10 ENCOUNTER — Telehealth: Payer: Self-pay | Admitting: Cardiology

## 2014-07-10 DIAGNOSIS — R079 Chest pain, unspecified: Secondary | ICD-10-CM | POA: Insufficient documentation

## 2014-07-10 DIAGNOSIS — Z87891 Personal history of nicotine dependence: Secondary | ICD-10-CM | POA: Diagnosis not present

## 2014-07-10 NOTE — Telephone Encounter (Signed)
Patient calling to ask [name] after she has her scheduled cath on 12/30, when would it be safe to return to work?" Informed pt that [name] is out of the office this week but informed her that typically the MD that performs the cath will advise on when she is able to return to work. Patient verbalized understanding and agrees with this plan.

## 2014-07-10 NOTE — Telephone Encounter (Signed)
New problem   Pt want to know after her Cath can she return to work the next day. Please advise.

## 2014-07-10 NOTE — Progress Notes (Signed)
Echocardiogram performed.

## 2014-07-11 ENCOUNTER — Other Ambulatory Visit (INDEPENDENT_AMBULATORY_CARE_PROVIDER_SITE_OTHER): Payer: BC Managed Care – PPO | Admitting: *Deleted

## 2014-07-11 DIAGNOSIS — R079 Chest pain, unspecified: Secondary | ICD-10-CM

## 2014-07-11 DIAGNOSIS — Z01812 Encounter for preprocedural laboratory examination: Secondary | ICD-10-CM

## 2014-07-11 LAB — COMPREHENSIVE METABOLIC PANEL
ALT: 16 U/L (ref 0–35)
AST: 20 U/L (ref 0–37)
Albumin: 4 g/dL (ref 3.5–5.2)
Alkaline Phosphatase: 74 U/L (ref 39–117)
BUN: 15 mg/dL (ref 6–23)
CO2: 28 mEq/L (ref 19–32)
Calcium: 9.3 mg/dL (ref 8.4–10.5)
Chloride: 104 mEq/L (ref 96–112)
Creatinine, Ser: 0.8 mg/dL (ref 0.4–1.2)
GFR: 81.77 mL/min (ref >60.00)
Glucose, Bld: 119 mg/dL — ABNORMAL HIGH (ref 70–99)
Potassium: 3.8 mEq/L (ref 3.5–5.1)
Sodium: 140 mEq/L (ref 135–145)
Total Bilirubin: 0.5 mg/dL (ref 0.2–1.2)
Total Protein: 7 g/dL (ref 6.0–8.3)

## 2014-07-11 LAB — CBC WITH DIFFERENTIAL/PLATELET
Basophils Absolute: 0 10*3/uL (ref 0.0–0.1)
Basophils Relative: 0.3 % (ref 0.0–3.0)
Eosinophils Absolute: 0.1 10*3/uL (ref 0.0–0.7)
Eosinophils Relative: 1.2 % (ref 0.0–5.0)
HCT: 43.6 % (ref 36.0–46.0)
Hemoglobin: 14.8 g/dL (ref 12.0–15.0)
Lymphocytes Relative: 17.8 % (ref 12.0–46.0)
Lymphs Abs: 1.6 10*3/uL (ref 0.7–4.0)
MCHC: 33.8 g/dL (ref 30.0–36.0)
MCV: 87.1 fl (ref 78.0–100.0)
Monocytes Absolute: 0.6 10*3/uL (ref 0.1–1.0)
Monocytes Relative: 7.1 % (ref 3.0–12.0)
Neutro Abs: 6.6 10*3/uL (ref 1.4–7.7)
Neutrophils Relative %: 73.6 % (ref 43.0–77.0)
Platelets: 196 10*3/uL (ref 150.0–400.0)
RBC: 5.01 Mil/uL (ref 3.87–5.11)
RDW: 13.4 % (ref 11.5–15.5)
WBC: 9 10*3/uL (ref 4.0–10.5)

## 2014-07-11 LAB — PROTIME-INR
INR: 0.9 ratio (ref 0.8–1.0)
Prothrombin Time: 10.1 s (ref 9.6–13.1)

## 2014-07-12 ENCOUNTER — Encounter (HOSPITAL_COMMUNITY): Payer: Self-pay | Admitting: Interventional Cardiology

## 2014-07-12 ENCOUNTER — Other Ambulatory Visit: Payer: Self-pay

## 2014-07-12 ENCOUNTER — Ambulatory Visit (HOSPITAL_COMMUNITY)
Admission: RE | Admit: 2014-07-12 | Discharge: 2014-07-12 | Disposition: A | Discharge status: Home / Self Care | Payer: BC Managed Care – PPO | Source: Ambulatory Visit | Attending: Interventional Cardiology | Admitting: Interventional Cardiology

## 2014-07-12 ENCOUNTER — Encounter (HOSPITAL_COMMUNITY)
Admission: RE | Disposition: A | Discharge status: Home / Self Care | Payer: BC Managed Care – PPO | Source: Ambulatory Visit | Attending: Interventional Cardiology

## 2014-07-12 DIAGNOSIS — R079 Chest pain, unspecified: Secondary | ICD-10-CM | POA: Insufficient documentation

## 2014-07-12 DIAGNOSIS — I2510 Atherosclerotic heart disease of native coronary artery without angina pectoris: Secondary | ICD-10-CM | POA: Diagnosis not present

## 2014-07-12 DIAGNOSIS — E780 Pure hypercholesterolemia: Secondary | ICD-10-CM | POA: Diagnosis not present

## 2014-07-12 DIAGNOSIS — J449 Chronic obstructive pulmonary disease, unspecified: Secondary | ICD-10-CM | POA: Insufficient documentation

## 2014-07-12 DIAGNOSIS — Z87891 Personal history of nicotine dependence: Secondary | ICD-10-CM | POA: Diagnosis not present

## 2014-07-12 DIAGNOSIS — F329 Major depressive disorder, single episode, unspecified: Secondary | ICD-10-CM | POA: Insufficient documentation

## 2014-07-12 DIAGNOSIS — Z85118 Personal history of other malignant neoplasm of bronchus and lung: Secondary | ICD-10-CM | POA: Insufficient documentation

## 2014-07-12 DIAGNOSIS — E119 Type 2 diabetes mellitus without complications: Secondary | ICD-10-CM | POA: Insufficient documentation

## 2014-07-12 HISTORY — PX: LEFT HEART CATHETERIZATION WITH CORONARY ANGIOGRAM: SHX5451

## 2014-07-12 LAB — GLUCOSE, CAPILLARY
GLUCOSE-CAPILLARY: 192 mg/dL — AB (ref 70–99)
Glucose-Capillary: 157 mg/dL — ABNORMAL HIGH (ref 70–99)

## 2014-07-12 SURGERY — LEFT HEART CATHETERIZATION WITH CORONARY ANGIOGRAM
Anesthesia: LOCAL

## 2014-07-12 MED ORDER — NITROGLYCERIN 1 MG/10 ML FOR IR/CATH LAB
INTRA_ARTERIAL | Status: AC
  Filled 2014-07-12: qty 10

## 2014-07-12 MED ORDER — OXYCODONE-ACETAMINOPHEN 5-325 MG PO TABS: 1.0000 | ORAL_TABLET | ORAL | Status: DC | PRN

## 2014-07-12 MED ORDER — MIDAZOLAM HCL 2 MG/2ML IJ SOLN
INTRAMUSCULAR | Status: AC
  Filled 2014-07-12: qty 2

## 2014-07-12 MED ORDER — HEPARIN SODIUM (PORCINE) 1000 UNIT/ML IJ SOLN
INTRAMUSCULAR | Status: AC
  Filled 2014-07-12: qty 1

## 2014-07-12 MED ORDER — SODIUM CHLORIDE 0.9 % IJ SOLN: 3.0000 mL | INTRAMUSCULAR | Status: DC | PRN

## 2014-07-12 MED ORDER — ACETAMINOPHEN 325 MG PO TABS
650.0000 mg | ORAL_TABLET | ORAL | Status: DC | PRN
Start: 2014-07-12 — End: 2014-07-12

## 2014-07-12 MED ORDER — SODIUM CHLORIDE 0.9 % IJ SOLN: 3.0000 mL | Freq: Two times a day (BID) | INTRAMUSCULAR | Status: DC

## 2014-07-12 MED ORDER — VERAPAMIL HCL 2.5 MG/ML IV SOLN
INTRAVENOUS | Status: AC
  Filled 2014-07-12: qty 2

## 2014-07-12 MED ORDER — LIDOCAINE HCL (PF) 1 % IJ SOLN
INTRAMUSCULAR | Status: AC
  Filled 2014-07-12: qty 30

## 2014-07-12 MED ORDER — SODIUM CHLORIDE 0.9 % IV SOLN
INTRAVENOUS | Status: DC
  Administered 2014-07-12: 1000 mL via INTRAVENOUS

## 2014-07-12 MED ORDER — SODIUM CHLORIDE 0.9 % IV SOLN: INTRAVENOUS | Status: DC

## 2014-07-12 MED ORDER — HEPARIN (PORCINE) IN NACL 2-0.9 UNIT/ML-% IJ SOLN
INTRAMUSCULAR | Status: AC
  Filled 2014-07-12: qty 1500

## 2014-07-12 MED ORDER — FENTANYL CITRATE 0.05 MG/ML IJ SOLN
INTRAMUSCULAR | Status: AC
  Filled 2014-07-12: qty 2

## 2014-07-12 MED ORDER — ASPIRIN 81 MG PO CHEW: 81.0000 mg | CHEWABLE_TABLET | ORAL | Status: DC

## 2014-07-12 MED ORDER — SODIUM CHLORIDE 0.9 % IV SOLN: 250.0000 mL | INTRAVENOUS | Status: DC | PRN

## 2014-07-12 MED ORDER — ONDANSETRON HCL 4 MG/2ML IJ SOLN: 4.0000 mg | Freq: Four times a day (QID) | INTRAMUSCULAR | Status: DC | PRN

## 2014-07-12 NOTE — H&P (Signed)
Amy Shepard is a 68 y.o. female  Admit Date: 07/12/2014 Referring Physician: Ena Dawley, M.D. Primary Cardiologist:: Ena Dawley, M.D. Chief complaint / reason for admission: Coronary artery disease  HPI: 68 year old female with history of lung cancer diagnosed in 2010. Recent follow-up CT scan demonstrated three-vessel coronary calcification including left main. The patient was seen in consultation by [name] who elicited a history of exertional dyspnea and epigastric discomfort. The patient was scheduled for coronary angiography to rule out high-grade coronary disease in the setting of poorly controlled diabetes, hypertension, smoking, and family history of premature atherosclerosis.  The patient denies chest discomfort. There is no orthopnea, PND, claudication, palpitations, or episodes of syncope. There is no prior history of heart disease.    PMH:    Past Medical History  Diagnosis Date  . Cancer     lung ca  . COPD (chronic obstructive pulmonary disease)   . Hypercholesteremia   . Lung cancer   . Depression   . Diabetes mellitus     [place]    PSH:    Past Surgical History  Procedure Laterality Date  . Thoracotomy     ALLERGIES:   Amoxicillin Prior to Admit Meds:   Prescriptions prior to admission  Medication Sig Dispense Refill Last Dose  . aspirin 81 MG tablet Take 81 mg by mouth daily.   07/12/2014 at Unknown time  . celecoxib (CELEBREX) 200 MG capsule Take 200 mg by mouth daily.   07/12/2014 at Unknown time  . glimepiride (AMARYL) 4 MG tablet Take 4 mg by mouth 2 (two) times daily.    07/12/2014 at Unknown time  . lisinopril (PRINIVIL,ZESTRIL) 10 MG tablet Take 10 mg by mouth daily.   07/12/2014 at Unknown time  . rosuvastatin (CRESTOR) 10 MG tablet Take 10 mg by mouth daily.   07/12/2014 at Unknown time  . sertraline (ZOLOFT) 100 MG tablet Take 100 mg by mouth daily.   07/12/2014 at Unknown time  . sitaGLIPtin-metformin (JANUMET) 50-1000  MG per tablet Take 1 tablet by mouth 2 (two) times daily with a meal.   07/11/2014 at Unknown time  . LORazepam (ATIVAN) 0.5 MG tablet Take 0.5 mg by mouth as needed for anxiety (for CT scan).    More than a month at Unknown time   Family HX:    Family History  Problem Relation Age of Onset  . Diabetes Father   . Heart failure Father   . Cancer Sister   . Diabetes Brother   . Heart failure Brother    Social HX:    History   Social History  . Marital Status: Single    Spouse Name: N/A    Number of Children: N/A  . Years of Education: N/A   Occupational History  . Not on file.   Social History Main Topics  . Smoking status: Former Smoker -- 1.00 packs/day for 30 years    Quit date: 07/14/2006  . Smokeless tobacco: Never Used  . Alcohol Use: No  . Drug Use: No  . Sexual Activity: Not on file   Other Topics Concern  . Not on file   Social History Narrative     ROS  Poor health care follow-up. Discontinue smoking 5 years ago when lung cancer was diagnosed. Moderate compliance with medication regimen. No blood in the urine or stool.  Physical Exam: Blood pressure 146/72, pulse 80, temperature 98.2 F (36.8 C), temperature source Oral, resp. rate 18, height _0  (1.549 m),  weight 183 lb (83.008 kg), SpO2 96 %.    Ruddy complexion. No cyanosis is noted in the nailbeds. The patient is in no distress. HEENT exam reveals pupils pupils are equally reactive to light. No pallor is noted. Neck exam reveals no JVD or carotid bruits. Chest is clear to auscultation and percussion. Abdomen is soft. No bruits are heard. Extremities reveal no edema. Posterior tibial pulses and radial pulses are 2+ and symmetric bilaterally The neurological exam reveals no focal abnormality  Labs: Lab Results  Component Value Date   WBC 9.0 07/11/2014   HGB 14.8 07/11/2014   HCT 43.6 07/11/2014   MCV 87.1 07/11/2014   PLT 196.0 07/11/2014    Recent Labs Lab 07/11/14 1252  NA 140  K 3.8    CL 104  CO2 28  BUN 15  CREATININE 0.8  CALCIUM 9.3  PROT 7.0  BILITOT 0.5  ALKPHOS 74  ALT 16  AST 20  GLUCOSE 119*   No results found for: CKTOTAL, CKMB, CKMBINDEX, TROPONINI    Radiology:  No results found.  EKG:  normal sinus rhythm, nonspecific T wave flattening.  ASSESSMENT:  1. Three-vessel coronary calcification including the left main coronary artery. The patient has risk factors for premature coronary atherosclerosis any history of exertional dyspnea with epigastric discomfort. Angiography is being done to exclude the possibility of obstructive coronary disease accounting for the symptoms. 2. Lung cancer, diagnosed 2010 and clinically stable 3. Diabetes mellitus, poorly controlled with hemoglobin A1c greater than 10  Plan:  Coronary angiography and possible PCI were discussed in detail. The risks of the procedure including stroke, death, myocardial infarction, bleeding, allergy, kidney injury, limb ischemia, among others were discussed in detail with the patient and accepted. [name] 07/12/2014 8:50 AM

## 2014-07-12 NOTE — Discharge Instructions (Signed)
NO JANUMET FOR 2 DAYS   Radial Site Care Refer to this sheet in the next few weeks. These instructions provide you with information on caring for yourself after your procedure. Your caregiver may also give you more specific instructions. Your treatment has been planned according to current medical practices, but problems sometimes occur. Call your caregiver if you have any problems or questions after your procedure. HOME CARE INSTRUCTIONS  You may shower the day after the procedure.Remove the bandage (dressing) and gently wash the site with plain soap and water.Gently pat the site dry.  Do not apply powder or lotion to the site.  Do not submerge the affected site in water for 3 to 5 days.  Inspect the site at least twice daily.  Do not flex or bend the affected arm for 24 hours.  No lifting over 5 pounds (2.3 kg) for 5 days after your procedure.  Do not drive home if you are discharged the same day of the procedure. Have someone else drive you.  You may drive 24 hours after the procedure unless otherwise instructed by your caregiver.  Do not operate machinery or power tools for 24 hours.  A responsible adult should be with you for the first 24 hours after you arrive home. What to expect:  Any bruising will usually fade within 1 to 2 weeks.  Blood that collects in the tissue (hematoma) may be painful to the touch. It should usually decrease in size and tenderness within 1 to 2 weeks. SEEK IMMEDIATE MEDICAL CARE IF:  You have unusual pain at the radial site.  You have redness, warmth, swelling, or pain at the radial site.  You have drainage (other than a small amount of blood on the dressing).  You have chills.  You have a fever or persistent symptoms for more than 72 hours.  You have a fever and your symptoms suddenly get worse.  Your arm becomes pale, cool, tingly, or numb.  You have heavy bleeding from the site. Hold pressure on the site. Document Released:  08/02/2010 Document Revised: 09/22/2011 Document Reviewed: 08/02/2010 [facility] Patient Information 2015 [place], [place]. This information is not intended to replace advice given to you by your health care provider. Make sure you discuss any questions you have with your health care provider.

## 2014-07-12 NOTE — CV Procedure (Signed)
     Left Heart Catheterization with Coronary Angiography  Report  Amy Shepard  68 y.o.  female 18-Oct-1945  Procedure Date: 07/12/2014 Referring Physician: Ena Dawley, M.D. Primary Cardiologist: Ena Dawley, M.D.  INDICATIONS: Heavy three-vessel calcification by chest CT, exertional epigastric discomfort, and poorly controlled diabetes  PROCEDURE: 1. Left heart catheterization; 2. Coronary angiography; 3. Left ventriculography  CONSENT:  The risks, benefits, and details of the procedure were explained in detail to the patient. Risks including death, stroke, heart attack, kidney injury, allergy, limb ischemia, bleeding and radiation injury were discussed.  The patient verbalized understanding and wanted to proceed.  Informed written consent was obtained.  PROCEDURE TECHNIQUE:  After Xylocaine anesthesia a 5 French Slender sheath was placed in the right radial artery with an angiocath and the modified Seldinger technique.  Coronary angiography was done using a 5 F JR 4 and JL 3.5 cm catheter.  Left ventriculography was done using the JR 4 catheter and hand injection.   No significant abnormality is noted and the case was terminated. Hemostasis was achieved with a wrist band.   CONTRAST:  Total of 80 cc.  COMPLICATIONS:  None   HEMODYNAMICS:  Aortic pressure 126/63 mmHg; LV pressure 123 / 6 mmHg; LVEDP 15 mmHg  ANGIOGRAPHIC DATA:   The left main coronary artery is  widely patent.  The left anterior descending artery is heavily calcified in the proximal and mid vessel. There is eccentric 40% proximal narrowing. There is 50-60% mid narrowing after the origin of the large diagonal. No hemodynamically significant obstruction is noted in the LAD.  The left circumflex artery is widely patent. There is moderate calcification in the mid and distal vessel.  There is 3 obtuse marginal branches. The second marginal is a dominant vessel that contains ectasia proximally but is  otherwise normal.  The right coronary arterydominant and widely patent. Luminal irregularities are noted in the mid and distal vessel. [name]   LEFT VENTRICULOGRAM:  Left ventricular angiogram was done in the 30 RAO projection and revealed normal LV cavity size and EF of 60%   IMPRESSIONS:   1. Nonobstructive coronary artery disease 2. Three-vessel coronary calcification 3. Normal left ventricular systolic function   RECOMMENDATION: Risk factor modification.

## 2014-09-29 ENCOUNTER — Telehealth: Payer: Self-pay | Admitting: Internal Medicine

## 2014-09-29 NOTE — Telephone Encounter (Signed)
patient line busy....mailed pt appt sched and letter of 5.18 appt moved to 5.23 due to MD on pal

## 2014-11-22 ENCOUNTER — Other Ambulatory Visit (HOSPITAL_BASED_OUTPATIENT_CLINIC_OR_DEPARTMENT_OTHER): Payer: BLUE CROSS/BLUE SHIELD

## 2014-11-22 ENCOUNTER — Ambulatory Visit (HOSPITAL_COMMUNITY)
Admission: RE | Admit: 2014-11-22 | Discharge: 2014-11-22 | Disposition: A | Discharge status: Home / Self Care | Payer: BLUE CROSS/BLUE SHIELD | Source: Ambulatory Visit | Attending: Internal Medicine | Admitting: Internal Medicine

## 2014-11-22 ENCOUNTER — Encounter (HOSPITAL_COMMUNITY): Payer: Self-pay

## 2014-11-22 DIAGNOSIS — C3431 Malignant neoplasm of lower lobe, right bronchus or lung: Secondary | ICD-10-CM | POA: Insufficient documentation

## 2014-11-22 DIAGNOSIS — Z85118 Personal history of other malignant neoplasm of bronchus and lung: Secondary | ICD-10-CM

## 2014-11-22 DIAGNOSIS — Z87891 Personal history of nicotine dependence: Secondary | ICD-10-CM | POA: Diagnosis not present

## 2014-11-22 LAB — COMPREHENSIVE METABOLIC PANEL (CC13)
ALT: 32 U/L (ref 0–55)
ANION GAP: 11 meq/L (ref 3–11)
AST: 30 U/L (ref 5–34)
Albumin: 3.4 g/dL — ABNORMAL LOW (ref 3.5–5.0)
Alkaline Phosphatase: 108 U/L (ref 40–150)
BUN: 15.3 mg/dL (ref 7.0–26.0)
CO2: 27 meq/L (ref 22–29)
CREATININE: 0.9 mg/dL (ref 0.6–1.1)
Calcium: 9.3 mg/dL (ref 8.4–10.4)
Chloride: 98 mEq/L (ref 98–109)
EGFR: 68 mL/min/{1.73_m2} — AB (ref >90)
Glucose: 387 mg/dl — ABNORMAL HIGH (ref 70–140)
Potassium: 4.3 mEq/L (ref 3.5–5.1)
Sodium: 136 mEq/L (ref 136–145)
Total Bilirubin: 0.26 mg/dL (ref 0.20–1.20)
Total Protein: 6.5 g/dL (ref 6.4–8.3)

## 2014-11-22 LAB — CBC WITH DIFFERENTIAL/PLATELET
BASO%: 0.5 % (ref 0.0–2.0)
Basophils Absolute: 0 10*3/uL (ref 0.0–0.1)
EOS ABS: 0.2 10*3/uL (ref 0.0–0.5)
EOS%: 3.2 % (ref 0.0–7.0)
HCT: 44.8 % (ref 34.8–46.6)
HEMOGLOBIN: 14.8 g/dL (ref 11.6–15.9)
LYMPH#: 1.3 10*3/uL (ref 0.9–3.3)
LYMPH%: 18 % (ref 14.0–49.7)
MCH: 28.6 pg (ref 25.1–34.0)
MCHC: 33 g/dL (ref 31.5–36.0)
MCV: 86.7 fL (ref 79.5–101.0)
MONO#: 0.7 10*3/uL (ref 0.1–0.9)
MONO%: 10.7 % (ref 0.0–14.0)
NEUT#: 4.7 10*3/uL (ref 1.5–6.5)
NEUT%: 67.6 % (ref 38.4–76.8)
Platelets: 179 10*3/uL (ref 145–400)
RBC: 5.17 10*6/uL (ref 3.70–5.45)
RDW: 14.1 % (ref 11.2–14.5)
WBC: 7 10*3/uL (ref 3.9–10.3)

## 2014-11-22 MED ORDER — IOHEXOL 300 MG/ML  SOLN
100.0000 mL | Freq: Once | INTRAMUSCULAR | Status: AC | PRN
  Administered 2014-11-22: 80 mL via INTRAVENOUS

## 2014-11-29 ENCOUNTER — Ambulatory Visit: Payer: BC Managed Care – PPO | Admitting: Internal Medicine

## 2014-11-29 ENCOUNTER — Telehealth: Payer: Self-pay | Admitting: *Deleted

## 2014-11-29 NOTE — Telephone Encounter (Signed)
TC from patient requesting review of her appts and also requesting to fax a copy of her lab results to her. @ [phone]. This was done

## 2014-12-04 ENCOUNTER — Ambulatory Visit (HOSPITAL_BASED_OUTPATIENT_CLINIC_OR_DEPARTMENT_OTHER): Payer: BLUE CROSS/BLUE SHIELD | Admitting: Internal Medicine

## 2014-12-04 ENCOUNTER — Telehealth: Payer: Self-pay | Admitting: Internal Medicine

## 2014-12-04 ENCOUNTER — Encounter: Payer: Self-pay | Admitting: Internal Medicine

## 2014-12-04 VITALS — BP 124/66 | HR 89 | Temp 97.7°F | Resp 18 | Ht 61.0 in | Wt 187.1 lb

## 2014-12-04 DIAGNOSIS — C3431 Malignant neoplasm of lower lobe, right bronchus or lung: Secondary | ICD-10-CM

## 2014-12-04 DIAGNOSIS — Z85118 Personal history of other malignant neoplasm of bronchus and lung: Secondary | ICD-10-CM

## 2014-12-04 NOTE — Telephone Encounter (Signed)
Gave patient avs report and appointments for May 2017.

## 2014-12-04 NOTE — Progress Notes (Signed)
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   PRIOR THERAPY:  1. Status post right lower lobe superior segmentectomy with lymph node dissection under the care of Dr. Arlyce Dice on July 20, 2008. 2. Status post adjuvant chemotherapy with carboplatin and etoposide for a total of 4 cycles. Last dose was given 10/25/2008.  CURRENT THERAPY: Observation.  CHEMOTHERAPY INTENT: Curative  CURRENT # OF CHEMOTHERAPY CYCLES: 0  CURRENT ANTIEMETICS: None  CURRENT SMOKING STATUS: Former smoker quit in 2008  ORAL CHEMOTHERAPY AND CONSENT: None  CURRENT BISPHOSPHONATES USE: None  PAIN MANAGEMENT: 0/10  NARCOTICS INDUCED CONSTIPATION: None  LIVING WILL AND CODE STATUS: No code Blue.   INTERVAL HISTORY: Amy Shepard 69 y.o. female returns to the clinic today for annual followup visit. The patient is feeling fine today with no specific complaints except for occasional cough. She denied having any significant chest pain, shortness of breath, or hemoptysis. She denied having any fever or chills. She has no nausea or vomiting. The patient denied having any significant weight loss or night sweats. She had repeat CT scan of the chest performed recently and she is here for evaluation and discussion of her scan results.  MEDICAL HISTORY: Past Medical History  Diagnosis Date  . COPD (chronic obstructive pulmonary disease)   . Hypercholesteremia   . Depression   . Cancer     lung ca  . Lung cancer   . Diabetes mellitus     januvia    ALLERGIES:  is allergic to amoxicillin.  MEDICATIONS:  Current Outpatient Prescriptions  Medication Sig Dispense Refill  . aspirin 81 MG tablet Take 81 mg by mouth daily.    . celecoxib  (CELEBREX) 200 MG capsule Take 200 mg by mouth daily.    [name] glimepiride (AMARYL) 4 MG tablet Take 4 mg by mouth 2 (two) times daily.     [name] lisinopril (PRINIVIL,ZESTRIL) 10 MG tablet Take 10 mg by mouth daily.    [name] LORazepam (ATIVAN) 0.5 MG tablet Take 0.5 mg by mouth as needed for anxiety (for CT scan).     . rosuvastatin (CRESTOR) 10 MG tablet Take 10 mg by mouth daily.    . sertraline (ZOLOFT) 100 MG tablet Take 100 mg by mouth daily.    . sitaGLIPtin-metformin (JANUMET) 50-1000 MG per tablet Take 1 tablet by mouth 2 (two) times daily with a meal.     No current facility-administered medications for this visit.    SURGICAL HISTORY:  Past Surgical History  Procedure Laterality Date  . Thoracotomy    . Left heart catheterization with coronary angiogram N/A 07/12/2014    Procedure: LEFT HEART CATHETERIZATION WITH CORONARY ANGIOGRAM;  Surgeon: [name], MD;  Location: [facility] CATH LAB;  Service: Cardiovascular;  Laterality: N/A;    REVIEW OF SYSTEMS:  A comprehensive review of systems was negative except for: Respiratory: positive for cough   PHYSICAL EXAMINATION: General appearance: alert, cooperative and no distress Head: Normocephalic, without obvious abnormality, atraumatic Neck: no adenopathy Lymph nodes: Cervical, supraclavicular, and axillary nodes normal. Resp: clear to auscultation bilaterally Cardio: regular rate and rhythm, S1, S2 normal, no murmur, click, rub or gallop GI: soft, non-tender; bowel sounds normal; no masses,  no organomegaly Extremities: extremities normal, atraumatic,  no cyanosis or edema  ECOG PERFORMANCE STATUS: 0 - Asymptomatic  Blood pressure 124/66, pulse 89, temperature 97.7 F (36.5 C), temperature source Oral, resp. rate 18, height _0  (1.549 m), weight 187 lb 1.6 oz (84.868 kg).  LABORATORY DATA: Lab Results  Component Value Date   WBC 7.0 11/22/2014   HGB 14.8 11/22/2014   HCT 44.8 11/22/2014   MCV 86.7 11/22/2014   PLT 179 11/22/2014        Chemistry      Component Value Date/Time   NA 136 11/22/2014 0814   NA 140 07/11/2014 1252   NA 141 03/01/2012 1041   K 4.3 11/22/2014 0814   K 3.8 07/11/2014 1252   K 4.1 03/01/2012 1041   CL 104 07/11/2014 1252   CL 104 08/30/2012 0908   CL 97* 03/01/2012 1041   CO2 27 11/22/2014 0814   CO2 28 07/11/2014 1252   CO2 31 03/01/2012 1041   BUN 15.3 11/22/2014 0814   BUN 15 07/11/2014 1252   BUN 13 03/01/2012 1041   CREATININE 0.9 11/22/2014 0814   CREATININE 0.8 07/11/2014 1252   CREATININE 0.7 03/01/2012 1041      Component Value Date/Time   CALCIUM 9.3 11/22/2014 0814   CALCIUM 9.3 07/11/2014 1252   CALCIUM 9.2 03/01/2012 1041   ALKPHOS 108 11/22/2014 0814   ALKPHOS 74 07/11/2014 1252   ALKPHOS 110* 03/01/2012 1041   AST 30 11/22/2014 0814   AST 20 07/11/2014 1252   AST 34 03/01/2012 1041   ALT 32 11/22/2014 0814   ALT 16 07/11/2014 1252   ALT 35 03/01/2012 1041   BILITOT 0.26 11/22/2014 0814   BILITOT 0.5 07/11/2014 1252   BILITOT 0.60 03/01/2012 1041       RADIOGRAPHIC STUDIES: Ct Chest W Contrast  11/22/2014   CLINICAL DATA:  Followup lung cancer  EXAM: CT CHEST WITH CONTRAST  TECHNIQUE: Multidetector CT imaging of the chest was performed during intravenous contrast administration.  CONTRAST:  86m OMNIPAQUE IOHEXOL 300 MG/ML  SOLN  COMPARISON:  05/18/2014  FINDINGS: Mediastinum: Normal heart size. No pericardial effusion identified. There is calcified atherosclerotic disease involving the thoracic aorta. There also calcifications within the RCA, LAD, left circumflex and left main coronary artery. Normal appearance of the esophagus. The trachea appears patent and is midline. Index AP window lymph node measures 6 mm, image 17/ series 6. Previously 9 mm. The index left paratracheal lymph node measures 8 mm, image 17/ series 6. Previously 10 mm. The index right paratracheal lymph node measures 6 mm, image 18 of series 6. Previously 1.6 cm. Finally, the sub- carinal  lymph node measures 1 cm, image 24/ series 6. Previously 1.6 cm.  Lungs/Pleura: No pleural effusion. There are postsurgical changes within the right lower lobe which appear stable from previous exam. No specific feature identified in the right lung to suggest local tumor recurrence.  Upper Abdomen: The visualized portions of the liver are unremarkable. The adrenal glands are both normal. Splenic granulomas noted.  Musculoskeletal: Review of the visualized bony structures is significant for thoracic spondylosis. No aggressive lytic or sclerotic bone lesions identified.  IMPRESSION: 1. Stable appearance of the right lung without evidence for local tumor recurrence. 2. Interval decrease in size of previously referenced mediastinal lymph nodes. 3. No new or progressive disease identified. 4. Atherosclerotic disease including left main and 3 vessel coronary artery disease   Electronically Signed   By: TKerby MoorsM.D.   On: 11/22/2014 10:25   ASSESSMENT  AND PLAN: This is a very pleasant 69 years old white female with a stage IIB mixed non-small cell lung cancer as well as small cell lung cancer diagnosed in June of 2010 status post right lower lobe superior segmentectomy with lymph node dissection followed by 4 cycles of adjuvant chemotherapy and has been observation since April 2010 with no evidence for disease recurrence. The recent CT scan of the chest showed no evidence for disease recurrence. I discussed the scan results with the patient and recommended for her to continue on observation with repeat CT scan of the chest in one year. She was advised to call immediately if she has any concerning symptoms in the interval.  The patient voices understanding of current disease status and treatment options and is in agreement with the current care plan.  All questions were answered. The patient knows to call the clinic with any problems, questions or concerns. We can certainly see the patient much sooner if  necessary.  Disclaimer: This note was dictated with voice recognition software. Similar sounding words can inadvertently be transcribed and may not be corrected upon review.

## 2015-10-17 ENCOUNTER — Other Ambulatory Visit: Payer: Self-pay | Admitting: Urology

## 2015-10-18 ENCOUNTER — Other Ambulatory Visit: Payer: Self-pay | Admitting: Urology

## 2015-10-18 DIAGNOSIS — N281 Cyst of kidney, acquired: Secondary | ICD-10-CM

## 2015-11-30 ENCOUNTER — Encounter (HOSPITAL_COMMUNITY): Payer: Self-pay

## 2015-11-30 ENCOUNTER — Ambulatory Visit (HOSPITAL_COMMUNITY): Admission: RE | Admit: 2015-11-30 | Payer: BLUE CROSS/BLUE SHIELD | Source: Ambulatory Visit

## 2015-11-30 ENCOUNTER — Other Ambulatory Visit (HOSPITAL_BASED_OUTPATIENT_CLINIC_OR_DEPARTMENT_OTHER): Payer: BLUE CROSS/BLUE SHIELD

## 2015-11-30 ENCOUNTER — Ambulatory Visit (HOSPITAL_COMMUNITY)
Admission: RE | Admit: 2015-11-30 | Discharge: 2015-11-30 | Disposition: A | Discharge status: Home / Self Care | Payer: BLUE CROSS/BLUE SHIELD | Source: Ambulatory Visit | Attending: Internal Medicine | Admitting: Internal Medicine

## 2015-11-30 DIAGNOSIS — C3431 Malignant neoplasm of lower lobe, right bronchus or lung: Secondary | ICD-10-CM | POA: Insufficient documentation

## 2015-11-30 DIAGNOSIS — N281 Cyst of kidney, acquired: Secondary | ICD-10-CM | POA: Insufficient documentation

## 2015-11-30 DIAGNOSIS — Z902 Acquired absence of lung [part of]: Secondary | ICD-10-CM | POA: Insufficient documentation

## 2015-11-30 DIAGNOSIS — K760 Fatty (change of) liver, not elsewhere classified: Secondary | ICD-10-CM | POA: Insufficient documentation

## 2015-11-30 DIAGNOSIS — I700 Atherosclerosis of aorta: Secondary | ICD-10-CM | POA: Diagnosis not present

## 2015-11-30 DIAGNOSIS — Z85118 Personal history of other malignant neoplasm of bronchus and lung: Secondary | ICD-10-CM | POA: Diagnosis not present

## 2015-11-30 DIAGNOSIS — N8189 Other female genital prolapse: Secondary | ICD-10-CM | POA: Diagnosis not present

## 2015-11-30 LAB — COMPREHENSIVE METABOLIC PANEL
ALT: 16 U/L (ref 0–55)
ANION GAP: 11 meq/L (ref 3–11)
AST: 13 U/L (ref 5–34)
Albumin: 3.7 g/dL (ref 3.5–5.0)
Alkaline Phosphatase: 112 U/L (ref 40–150)
BUN: 19 mg/dL (ref 7.0–26.0)
CHLORIDE: 98 meq/L (ref 98–109)
CO2: 27 meq/L (ref 22–29)
Calcium: 10.3 mg/dL (ref 8.4–10.4)
Creatinine: 1.2 mg/dL — ABNORMAL HIGH (ref 0.6–1.1)
EGFR: 47 mL/min/{1.73_m2} — AB (ref >90)
Glucose: 339 mg/dl — ABNORMAL HIGH (ref 70–140)
Potassium: 4.2 mEq/L (ref 3.5–5.1)
Sodium: 136 mEq/L (ref 136–145)
Total Bilirubin: 0.39 mg/dL (ref 0.20–1.20)
Total Protein: 7.3 g/dL (ref 6.4–8.3)

## 2015-11-30 LAB — CBC WITH DIFFERENTIAL/PLATELET
BASO%: 0.6 % (ref 0.0–2.0)
BASOS ABS: 0 10*3/uL (ref 0.0–0.1)
EOS ABS: 0.3 10*3/uL (ref 0.0–0.5)
EOS%: 3.6 % (ref 0.0–7.0)
HEMATOCRIT: 47.5 % — AB (ref 34.8–46.6)
HGB: 15.8 g/dL (ref 11.6–15.9)
LYMPH#: 1.4 10*3/uL (ref 0.9–3.3)
LYMPH%: 19.1 % (ref 14.0–49.7)
MCH: 28.8 pg (ref 25.1–34.0)
MCHC: 33.4 g/dL (ref 31.5–36.0)
MCV: 86.3 fL (ref 79.5–101.0)
MONO#: 0.7 10*3/uL (ref 0.1–0.9)
MONO%: 9.2 % (ref 0.0–14.0)
NEUT#: 4.8 10*3/uL (ref 1.5–6.5)
NEUT%: 67.5 % (ref 38.4–76.8)
Platelets: 191 10*3/uL (ref 145–400)
RBC: 5.51 10*6/uL — AB (ref 3.70–5.45)
RDW: 14.8 % — ABNORMAL HIGH (ref 11.2–14.5)
WBC: 7.2 10*3/uL (ref 3.9–10.3)

## 2015-11-30 MED ORDER — DIATRIZOATE MEGLUMINE & SODIUM 66-10 % PO SOLN
30.0000 mL | ORAL | Status: AC | PRN
  Administered 2015-11-30: 30 mL via ORAL

## 2015-11-30 MED ORDER — IOPAMIDOL (ISOVUE-300) INJECTION 61%
100.0000 mL | Freq: Once | INTRAVENOUS | Status: AC | PRN
  Administered 2015-11-30: 100 mL via INTRAVENOUS

## 2015-12-06 ENCOUNTER — Telehealth: Payer: Self-pay | Admitting: Internal Medicine

## 2015-12-06 ENCOUNTER — Encounter: Payer: Self-pay | Admitting: Internal Medicine

## 2015-12-06 ENCOUNTER — Ambulatory Visit (HOSPITAL_BASED_OUTPATIENT_CLINIC_OR_DEPARTMENT_OTHER): Payer: BLUE CROSS/BLUE SHIELD | Admitting: Internal Medicine

## 2015-12-06 VITALS — BP 117/59 | HR 91 | Temp 98.2°F | Resp 18 | Ht 61.0 in | Wt 174.4 lb

## 2015-12-06 DIAGNOSIS — Z85118 Personal history of other malignant neoplasm of bronchus and lung: Secondary | ICD-10-CM

## 2015-12-06 DIAGNOSIS — C3431 Malignant neoplasm of lower lobe, right bronchus or lung: Secondary | ICD-10-CM

## 2015-12-06 NOTE — Progress Notes (Signed)
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   PRIOR THERAPY:  1. Status post right lower lobe superior segmentectomy with lymph node dissection under the care of Dr. Arlyce Dice on July 20, 2008. 2. Status post adjuvant chemotherapy with carboplatin and etoposide for a total of 4 cycles. Last dose was given 10/25/2008.  CURRENT THERAPY: Observation.  CHEMOTHERAPY INTENT: Curative  CURRENT # OF CHEMOTHERAPY CYCLES: 0  CURRENT ANTIEMETICS: None  CURRENT SMOKING STATUS: Former smoker quit in 2008  ORAL CHEMOTHERAPY AND CONSENT: None  CURRENT BISPHOSPHONATES USE: None  PAIN MANAGEMENT: 0/10  NARCOTICS INDUCED CONSTIPATION: None  LIVING WILL AND CODE STATUS: No code Blue.   INTERVAL HISTORY: Amy Shepard 70 y.o. female returns to the clinic today for annual followup visit. The patient is feeling fine today with no specific complaints. She has been observation for the last 7 years. She denied having any significant chest pain, shortness of breath, or hemoptysis. She denied having any fever or chills. She has no nausea or vomiting. The patient denied having any significant weight loss or night sweats. She had repeat CT scan of the chest performed recently and she is here for evaluation and discussion of her scan results.  MEDICAL HISTORY: Past Medical History  Diagnosis Date  . COPD (chronic obstructive pulmonary disease) ([place])   . Hypercholesteremia   . Depression   . Diabetes mellitus     [name]  . Cancer ([place])     lung ca  . Lung cancer ([place])     ALLERGIES:  is allergic to amoxicillin.  MEDICATIONS:  Current Outpatient Prescriptions  Medication Sig Dispense Refill  . aspirin 81 MG tablet Take 81 mg  by mouth daily.    . celecoxib (CELEBREX) 200 MG capsule Take 200 mg by mouth daily.    [name] glimepiride (AMARYL) 4 MG tablet Take 4 mg by mouth 2 (two) times daily.     [name] lisinopril (PRINIVIL,ZESTRIL) 10 MG tablet Take 10 mg by mouth daily.    [name] LORazepam (ATIVAN) 0.5 MG tablet Take 0.5 mg by mouth as needed for anxiety (for CT scan).     . rosuvastatin (CRESTOR) 10 MG tablet Take 10 mg by mouth daily.    . sertraline (ZOLOFT) 100 MG tablet Take 100 mg by mouth daily.    . sitaGLIPtin-metformin (JANUMET) 50-1000 MG per tablet Take 1 tablet by mouth 2 (two) times daily with a meal.     No current facility-administered medications for this visit.   Facility-Administered Medications Ordered in Other Visits  Medication Dose Route Frequency Provider Last Rate Last Dose  . diatrizoate meglumine-sodium (GASTROGRAFIN) 66-10 % solution 30 mL  30 mL Oral PRN Bjorn Loser, MD   30 mL at 11/30/15 0820    SURGICAL HISTORY:  Past Surgical History  Procedure Laterality Date  . Thoracotomy    . Left heart catheterization with coronary angiogram N/A 07/12/2014    Procedure: LEFT HEART CATHETERIZATION WITH CORONARY ANGIOGRAM;  Surgeon: [name], MD;  Location: [facility] CATH LAB;  Service: Cardiovascular;  Laterality: N/A;    REVIEW OF SYSTEMS:  A comprehensive review of systems was negative.   PHYSICAL EXAMINATION: General appearance: alert, cooperative and no distress Head: Normocephalic, without obvious abnormality, atraumatic  Neck: no adenopathy Lymph nodes: Cervical, supraclavicular, and axillary nodes normal. Resp: clear to auscultation bilaterally Cardio: regular rate and rhythm, S1, S2 normal, no murmur, click, rub or gallop GI: soft, non-tender; bowel sounds normal; no masses,  no organomegaly Extremities: extremities normal, atraumatic, no cyanosis or edema  ECOG PERFORMANCE STATUS: 0 - Asymptomatic  There were no vitals taken for this visit.  LABORATORY DATA: Lab Results    Component Value Date   WBC 7.2 11/30/2015   HGB 15.8 11/30/2015   HCT 47.5* 11/30/2015   MCV 86.3 11/30/2015   PLT 191 11/30/2015      Chemistry      Component Value Date/Time   NA 136 11/30/2015 0758   NA 140 07/11/2014 1252   NA 141 03/01/2012 1041   K 4.2 11/30/2015 0758   K 3.8 07/11/2014 1252   K 4.1 03/01/2012 1041   CL 104 07/11/2014 1252   CL 104 08/30/2012 0908   CL 97* 03/01/2012 1041   CO2 27 11/30/2015 0758   CO2 28 07/11/2014 1252   CO2 31 03/01/2012 1041   BUN 19.0 11/30/2015 0758   BUN 15 07/11/2014 1252   BUN 13 03/01/2012 1041   CREATININE 1.2* 11/30/2015 0758   CREATININE 0.8 07/11/2014 1252   CREATININE 0.7 03/01/2012 1041      Component Value Date/Time   CALCIUM 10.3 11/30/2015 0758   CALCIUM 9.3 07/11/2014 1252   CALCIUM 9.2 03/01/2012 1041   ALKPHOS 112 11/30/2015 0758   ALKPHOS 74 07/11/2014 1252   ALKPHOS 110* 03/01/2012 1041   AST 13 11/30/2015 0758   AST 20 07/11/2014 1252   AST 34 03/01/2012 1041   ALT 16 11/30/2015 0758   ALT 16 07/11/2014 1252   ALT 35 03/01/2012 1041   BILITOT 0.39 11/30/2015 0758   BILITOT 0.5 07/11/2014 1252   BILITOT 0.60 03/01/2012 1041       RADIOGRAPHIC STUDIES: Ct Abdomen Pelvis W Wo Contrast  11/30/2015  CLINICAL DATA:  Lung cancer diagnosed in 2009. Chemotherapy completed in 2010. Renal cyst. EXAM: CT CHEST, ABDOMEN, AND PELVIS WITH CONTRAST TECHNIQUE: Multidetector CT imaging of the chest, abdomen and pelvis was performed following the standard protocol during bolus administration of intravenous contrast. CONTRAST:  151m ISOVUE-300 IOPAMIDOL (ISOVUE-300) INJECTION 61% COMPARISON:  PET CT 07/11/2008.  Chest CT 11/22/2014. FINDINGS: CT CHEST Mediastinum/Nodes: Small mediastinal lymph nodes have not significantly changed in size, including a 6 mm right paratracheal node on image 18, an AP window node measuring 8 mm on image 178 and 11 mm subcarinal node on image 24. There is no axillary or hilar adenopathy.  The thyroid gland, trachea and esophagus demonstrate no significant findings. The heart size is normal. Small pericardial effusion is stable. There is extensive atherosclerosis of the aorta, great vessels and coronary arteries. Lungs/Pleura: There is no pleural effusion. Stable postsurgical changes status post right lower lobe wedge resection with surrounding mild scar. Emphysema and mild bronchiectasis noted. No suspicious pulmonary nodule or confluent airspace opacity. Musculoskeletal/Chest wall: No chest wall mass or suspicious osseous findings. CT ABDOMEN AND PELVIS FINDINGS Hepatobiliary: Mild hepatic steatosis appears improved. No focal hepatic lesions are seen post-contrast. No evidence of gallstones, gallbladder wall thickening or biliary dilatation. Pancreas: Unremarkable. No pancreatic ductal dilatation or surrounding inflammatory changes. Spleen: There are scattered splenic granulomas. The spleen is normal in size without other focal abnormality. Adrenals/Urinary Tract: Both adrenal glands appear normal. Precontrast images demonstrate no urinary tract calculi. There are low-density nonenhancing renal cysts bilaterally, measuring  up to 13 mm on the right (image 71) and 18 mm on the left (image 67, series 4). The ureters and bladder appear unremarkable. Stomach/Bowel: No evidence of bowel wall thickening, distention or surrounding inflammatory change. The appendix appears normal. Vascular/Lymphatic: There are no enlarged abdominal or pelvic lymph nodes. There are stable small lymph nodes within the porta hepatis and retroperitoneum. There is diffuse aortic and branch vessel atherosclerosis. No venous abnormalities are seen. Reproductive: There is pelvic floor laxity with inferior migration of the bladder base, vagina and rectum. The uterus and ovaries appear normal. No evidence of adnexal mass. Other: No ascites or anterior abdominal wall hernia. Musculoskeletal: No acute or significant osseous findings.  Degenerative changes are present throughout the spine. IMPRESSION: 1. Stable postsurgical changes status post right lung wedge resection. No evidence of local recurrence. 2. Stable small mediastinal lymph nodes. No evidence of metastatic disease. 3. Stable abdominal pelvic CT without suspicious findings. There is mild hepatic steatosis, bilateral renal cysts and aortoiliac atherosclerosis. 4. Pelvic floor laxity. Electronically Signed   By: Richardean Sale M.D.   On: 11/30/2015 10:51   Ct Chest W Contrast  11/30/2015  CLINICAL DATA:  Lung cancer diagnosed in 2009. Chemotherapy completed in 2010. Renal cyst. EXAM: CT CHEST, ABDOMEN, AND PELVIS WITH CONTRAST TECHNIQUE: Multidetector CT imaging of the chest, abdomen and pelvis was performed following the standard protocol during bolus administration of intravenous contrast. CONTRAST:  111m ISOVUE-300 IOPAMIDOL (ISOVUE-300) INJECTION 61% COMPARISON:  PET CT 07/11/2008.  Chest CT 11/22/2014. FINDINGS: CT CHEST Mediastinum/Nodes: Small mediastinal lymph nodes have not significantly changed in size, including a 6 mm right paratracheal node on image 18, an AP window node measuring 8 mm on image 178 and 11 mm subcarinal node on image 24. There is no axillary or hilar adenopathy. The thyroid gland, trachea and esophagus demonstrate no significant findings. The heart size is normal. Small pericardial effusion is stable. There is extensive atherosclerosis of the aorta, great vessels and coronary arteries. Lungs/Pleura: There is no pleural effusion. Stable postsurgical changes status post right lower lobe wedge resection with surrounding mild scar. Emphysema and mild bronchiectasis noted. No suspicious pulmonary nodule or confluent airspace opacity. Musculoskeletal/Chest wall: No chest wall mass or suspicious osseous findings. CT ABDOMEN AND PELVIS FINDINGS Hepatobiliary: Mild hepatic steatosis appears improved. No focal hepatic lesions are seen post-contrast. No evidence of  gallstones, gallbladder wall thickening or biliary dilatation. Pancreas: Unremarkable. No pancreatic ductal dilatation or surrounding inflammatory changes. Spleen: There are scattered splenic granulomas. The spleen is normal in size without other focal abnormality. Adrenals/Urinary Tract: Both adrenal glands appear normal. Precontrast images demonstrate no urinary tract calculi. There are low-density nonenhancing renal cysts bilaterally, measuring up to 13 mm on the right (image 71) and 18 mm on the left (image 67, series 4). The ureters and bladder appear unremarkable. Stomach/Bowel: No evidence of bowel wall thickening, distention or surrounding inflammatory change. The appendix appears normal. Vascular/Lymphatic: There are no enlarged abdominal or pelvic lymph nodes. There are stable small lymph nodes within the porta hepatis and retroperitoneum. There is diffuse aortic and branch vessel atherosclerosis. No venous abnormalities are seen. Reproductive: There is pelvic floor laxity with inferior migration of the bladder base, vagina and rectum. The uterus and ovaries appear normal. No evidence of adnexal mass. Other: No ascites or anterior abdominal wall hernia. Musculoskeletal: No acute or significant osseous findings. Degenerative changes are present throughout the spine. IMPRESSION: 1. Stable postsurgical changes status post right lung wedge resection. No evidence of  local recurrence. 2. Stable small mediastinal lymph nodes. No evidence of metastatic disease. 3. Stable abdominal pelvic CT without suspicious findings. There is mild hepatic steatosis, bilateral renal cysts and aortoiliac atherosclerosis. 4. Pelvic floor laxity. Electronically Signed   By: Richardean Sale M.D.   On: 11/30/2015 10:51   ASSESSMENT AND PLAN: This is a very pleasant 70 years old white female with a stage IIB mixed non-small cell lung cancer as well as small cell lung cancer diagnosed in June of 2010 status post right lower lobe  superior segmentectomy with lymph node dissection followed by 4 cycles of adjuvant chemotherapy and has been observation since April 2010 with no evidence for disease recurrence. The recent CT scan of the chest showed no evidence for disease recurrence. I discussed the scan results with the patient. I recommended for the patient to see the survivorship clinic in 1 year for evaluation. I will see her on as-needed basis at this point. For the hyperglycemia, I recommended for the patient to take her medication as prescribed by her primary care physician and to reconsult with her regarding any changes in her medication. She was advised to call immediately if she has any concerning symptoms in the interval.  The patient voices understanding of current disease status and treatment options and is in agreement with the current care plan.  All questions were answered. The patient knows to call the clinic with any problems, questions or concerns. We can certainly see the patient much sooner if necessary.  Disclaimer: This note was dictated with voice recognition software. Similar sounding words can inadvertently be transcribed and may not be corrected upon review.

## 2015-12-06 NOTE — Telephone Encounter (Signed)
per pof to sch pt appt-sent Gretchen email to sch Survivoirship appt

## 2016-01-17 ENCOUNTER — Emergency Department (HOSPITAL_COMMUNITY): Payer: BLUE CROSS/BLUE SHIELD

## 2016-01-17 ENCOUNTER — Encounter (HOSPITAL_COMMUNITY): Payer: Self-pay | Admitting: Emergency Medicine

## 2016-01-17 ENCOUNTER — Emergency Department (HOSPITAL_COMMUNITY)
Admission: EM | Admit: 2016-01-17 | Discharge: 2016-01-18 | Disposition: A | Discharge status: Home / Self Care | Payer: BLUE CROSS/BLUE SHIELD | Attending: Emergency Medicine | Admitting: Emergency Medicine

## 2016-01-17 DIAGNOSIS — M4807 Spinal stenosis, lumbosacral region: Secondary | ICD-10-CM

## 2016-01-17 DIAGNOSIS — Z7982 Long term (current) use of aspirin: Secondary | ICD-10-CM | POA: Diagnosis not present

## 2016-01-17 DIAGNOSIS — Z87891 Personal history of nicotine dependence: Secondary | ICD-10-CM | POA: Diagnosis not present

## 2016-01-17 DIAGNOSIS — W19XXXA Unspecified fall, initial encounter: Secondary | ICD-10-CM

## 2016-01-17 DIAGNOSIS — J449 Chronic obstructive pulmonary disease, unspecified: Secondary | ICD-10-CM | POA: Diagnosis not present

## 2016-01-17 DIAGNOSIS — M545 Low back pain, unspecified: Secondary | ICD-10-CM

## 2016-01-17 DIAGNOSIS — F329 Major depressive disorder, single episode, unspecified: Secondary | ICD-10-CM | POA: Insufficient documentation

## 2016-01-17 DIAGNOSIS — E119 Type 2 diabetes mellitus without complications: Secondary | ICD-10-CM | POA: Insufficient documentation

## 2016-01-17 DIAGNOSIS — M4806 Spinal stenosis, lumbar region: Secondary | ICD-10-CM | POA: Insufficient documentation

## 2016-01-17 DIAGNOSIS — Z85118 Personal history of other malignant neoplasm of bronchus and lung: Secondary | ICD-10-CM | POA: Insufficient documentation

## 2016-01-17 DIAGNOSIS — Z79899 Other long term (current) drug therapy: Secondary | ICD-10-CM | POA: Insufficient documentation

## 2016-01-17 MED ORDER — LORAZEPAM 2 MG/ML IJ SOLN
1.0000 mg | Freq: Once | INTRAMUSCULAR | Status: AC
  Administered 2016-01-17: 1 mg via INTRAMUSCULAR
  Filled 2016-01-17: qty 1

## 2016-01-17 NOTE — ED Notes (Addendum)
Pt fell back on buttocks a couple weeks ago. States she saw her PCP after fall who said they were going to do an MRI but she never heard back from them. C/o right lower back pain that is radiating into her mid lower back, rt buttock, and down right leg. "I have a hard time walking because of the pain." Has not had any imaging of hip since the fall

## 2016-01-17 NOTE — ED Provider Notes (Signed)
CSN: [id number]     Arrival date & time 01/17/16  1421 History   First MD Initiated Contact with Patient 01/17/16 1614     Chief Complaint  Patient presents with  . Hip Pain  . Back Pain     (Consider location/radiation/quality/duration/timing/severity/associated sxs/prior Treatment) HPI Amy Shepard is a 70 y.o. female with PMH significant for COPD, diabetes, and lung cancer who presents with right lower back and right hip pain after sustaining a fall 2-3 weeks ago. Patient reports she was standing going to pick up a bag when she lost her footing causing her to fall backwards, hitting a plant, and landing on small of her back on the cement. Denies head injury or loss of consciousness. She states she has been taking Aleve without relief. She chronically takes Celebrex for her left knee pain. She states the pain starts in her back and will shoot across her lower back and occasionally down her right leg to the back of her knee. She states it has become increasingly more difficult to ambulate. Denies numbness, weakness, bowel or bladder incontinence, neck pain.  Past Medical History  Diagnosis Date  . COPD (chronic obstructive pulmonary disease) ([place])   . Hypercholesteremia   . Depression   . Diabetes mellitus     [name]  . Cancer ([place])     lung ca  . Lung cancer [facility])    Past Surgical History  Procedure Laterality Date  . Thoracotomy    . Left heart catheterization with coronary angiogram N/A 07/12/2014    Procedure: LEFT HEART CATHETERIZATION WITH CORONARY ANGIOGRAM;  Surgeon: [name], MD;  Location: [facility] CATH LAB;  Service: Cardiovascular;  Laterality: N/A;   Family History  Problem Relation Age of Onset  . Diabetes Father   . Heart failure Father   . Cancer Sister   . Diabetes Brother   . Heart failure Brother    Social History  Substance Use Topics  . Smoking status: Former Smoker -- 1.00 packs/day for 30 years    Quit date: 07/14/2006  . Smokeless tobacco:  Never Used  . Alcohol Use: No   OB History    No data available     Review of Systems All other systems negative unless otherwise stated in HPI    Allergies  Amoxicillin  Home Medications   Prior to Admission medications   Medication Sig Start Date End Date Taking? Authorizing Provider  aspirin 81 MG tablet Take 81 mg by mouth daily.    Historical Provider, MD  celecoxib (CELEBREX) 200 MG capsule Take 200 mg by mouth daily.    Historical Provider, MD  LORazepam (ATIVAN) 0.5 MG tablet Take 0.5 mg by mouth as needed for anxiety (for CT scan).     Historical Provider, MD  sertraline (ZOLOFT) 100 MG tablet Take 100 mg by mouth daily.    Historical Provider, MD  sitaGLIPtin-metformin (JANUMET) 50-1000 MG per tablet Take 1 tablet by mouth 2 (two) times daily with a meal.    Historical Provider, MD   BP 115/72 mmHg  Pulse 74  Temp(Src) 98.8 F (37.1 C) (Oral)  Resp 18  SpO2 100% Physical Exam  Constitutional: She is oriented to person, place, and time. She appears well-developed and well-nourished.  Non-toxic appearance. She does not have a sickly appearance. She does not appear ill.  HENT:  Head: Normocephalic and atraumatic.  Mouth/Throat: Oropharynx is clear and moist.  Eyes: Conjunctivae are normal.  Neck: Normal range of motion. Neck supple.  Cardiovascular: Normal rate and regular rhythm.   Pulses:      Dorsalis pedis pulses are 2+ on the right side, and 2+ on the left side.  Pulmonary/Chest: Effort normal and breath sounds normal. No accessory muscle usage or stridor. No respiratory distress. She has no wheezes. She has no rhonchi. She has no rales.  Abdominal: Soft. Bowel sounds are normal. She exhibits no distension. There is no tenderness.  Musculoskeletal: Normal range of motion. She exhibits tenderness.       Back:  No bruising, swelling, or deformity.  TTP along lumbosacral spine and b/l ASIS (R>L).  No tenderness along greater trochanter. FAROM of hips bilaterally  without pain.  No pain with internal/external rotation of the right hip.   Lymphadenopathy:    She has no cervical adenopathy.  Neurological: She is alert and oriented to person, place, and time.  5/5 strength in hip flexion and extension and remaining lower extremity bilaterally. Sensation intact to light touch.  Difficulty ambulating due to sudden sharp pains causing her to grab the bed.  Skin: Skin is warm and dry.  Psychiatric: She has a normal mood and affect. Her behavior is normal.    ED Course  Procedures (including critical care time) Labs Review Labs Reviewed - No data to display  Imaging Review Dg Lumbar Spine Complete  01/17/2016  CLINICAL DATA:  [place] with lumbosacral pain, injury 2 weeks ago. Low right back pain radiating to the buttock and right leg. EXAM: LUMBAR SPINE - COMPLETE 4+ VIEW COMPARISON:  CT 11/30/2015 FINDINGS: Mild curvature convex to the left. 3 mm of anterolisthesis at L3-4 because of facet arthropathy. Disc degeneration at L3-4 disc space narrowing and vacuum phenomenon. No sign of fracture. Lower lumbar facet arthropathy without slippage on these images. There is aortic atherosclerosis. IMPRESSION: Mild curvature convex to the left. Lower lumbar facet arthropathy with 3 mm of anterolisthesis at the L3-4 level. Disc degeneration at L3-4 disc space narrowing and vacuum phenomenon. No fracture or other acute finding. Aortic atherosclerosis Electronically Signed   By: Nelson Chimes M.D.   On: 01/17/2016 17:02   Dg Sacrum/coccyx  01/17/2016  CLINICAL DATA:  [place] onto the buttocks 2 weeks ago with right-sided pain subsequently. EXAM: SACRUM AND COCCYX - 2+ VIEW COMPARISON:  Lumbar radiographs same day.  CT 11/30/2015 FINDINGS: No evidence of sacral fracture. IMPRESSION: Negative radiographs of the sacrococcygeal region. Electronically Signed   By: Nelson Chimes M.D.   On: 01/17/2016 17:04   Mr Lumbar Spine Wo Contrast  01/17/2016  CLINICAL DATA:  70 year old female fell  on buttocks 2 weeks ago. Right lower back pain extending into mid-lower back right buttock and down right leg. Lung cancer post chemotherapy 2010. Subsequent encounter. EXAM: MRI LUMBAR SPINE WITHOUT CONTRAST TECHNIQUE: Multiplanar, multisequence MR imaging of the lumbar spine was performed. No intravenous contrast was administered. COMPARISON:  01/17/2016 plain film exam. FINDINGS: Segmentation: Last fully open disk space is labeled L5-S1. Present examination incorporates from T10-11 disc space through lower sacrum. Alignment:  Mild curvature convex left. Vertebrae:  No acute abnormality. Conus medullaris: Extends to the L1-2 disc space level and appears normal. Paraspinal and other soft tissues: Small renal cysts. Nonspecific 1 cm fluid collection medial to the lower right psoas muscle. Atherosclerotic changes aorta. Disc levels: T10-11: Minimal bulge. T11-12: Minimal to mild bulge. T12-L1: Minimal facet degenerative changes. L1-2: Minimal to mild facet degenerative changes. L2-3: Mild facet degenerative changes.  Minimal bulge. L3-4: Moderate facet degenerative changes. Ligamentum flavum hypertrophy.  Bulge. Mild bilateral foraminal narrowing. Multifactorial mild spinal stenosis. L4-5: Prominent facet degenerative changes. Ligamentum flavum hypertrophy. Bilateral synovial cysts measuring up to 6 mm. Bulge. Multifactorial marked bilateral lateral recess stenosis and moderate to marked multifactorial spinal stenosis. Mild bilateral foraminal narrowing. L5-S1: Prominent bilateral facet degenerative changes. Mild bulge. No significant spinal stenosis. Very mild bilateral lateral recess stenosis and foraminal narrowing. IMPRESSION: L4-5 prominent facet degenerative changes. Ligamentum flavum hypertrophy. Bilateral synovial cysts measuring up to 6 mm. Bulge. Multifactorial marked bilateral lateral recess stenosis and moderate to marked multifactorial spinal stenosis. Mild bilateral foraminal narrowing. L3-4  multifactorial mild spinal stenosis. Mild bilateral foraminal narrowing. L5-S1 very mild bilateral lateral recess stenosis and foraminal narrowing. Electronically Signed   By: Genia Del M.D.   On: 01/17/2016 20:34   Dg Hip Unilat  With Pelvis 2-3 Views Right  01/17/2016  CLINICAL DATA:  Fall.  Right hip pain EXAM: DG HIP (WITH OR WITHOUT PELVIS) 2-3V RIGHT COMPARISON:  None. FINDINGS: There is no evidence of hip fracture or dislocation. There is no evidence of arthropathy or other focal bone abnormality. IMPRESSION: Negative. Electronically Signed   By: Franchot Gallo M.D.   On: 01/17/2016 15:52   I have personally reviewed and evaluated these images and lab results as part of my medical decision-making.   EKG Interpretation None      MDM   Final diagnoses:  Low back pain  Fall, initial encounter  Spinal stenosis of lumbosacral region   Patient presents with low back pain/right hip pain s/p fall 2-3 weeks ago.  5/5 strength in BLE, Normal sensation, good pulses.  However, when attempting to ambulate she has sudden sharp radiating back that causes her to almost fall.  Plain films of l/s spine and right hip unremarkable.  Due to extreme difficulty ambulating, will obtain MRI of lumbar spine for further evaluation.  MRI shows degenerative changes, nothing acute.  Patient received Ativan prior to imaging.  She has been observed in ED, and is able to able to be discharged safely.  Will discharge home with Norco and Neurosurgery follow up.  Discussed return precautions.  Patient agrees and acknowledges the above plan for discharge.  Case has been discussed with Dr. Johnney Killian who agrees with the above plan for discharge.     Gloriann Loan, PA-C 01/18/16 0010  Charlesetta Shanks, MD 01/18/16 [phone]

## 2016-01-17 NOTE — ED Notes (Signed)
Patient transported to MRI 

## 2016-01-18 MED ORDER — HYDROCODONE-ACETAMINOPHEN 5-325 MG PO TABS: 1.0000 | ORAL_TABLET | Freq: Four times a day (QID) | ORAL | Status: DC | PRN

## 2016-01-18 NOTE — Discharge Instructions (Signed)
Your work up today did not show any acute changes.  These are the findings from your MRI today  L4-5 prominent facet degenerative changes. Ligamentum flavum hypertrophy. Bilateral synovial cysts measuring up to 6 mm. Bulge. Multifactorial marked bilateral lateral recess stenosis and moderate to marked multifactorial spinal stenosis. Mild bilateral foraminal narrowing.  L3-4 multifactorial mild spinal stenosis. Mild bilateral foraminal narrowing.  L5-S1 very mild bilateral lateral recess stenosis and foraminal narrowing. Back Pain, Adult Back pain is very common in adults.The cause of back pain is rarely dangerous and the pain often gets better over time.The cause of your back pain may not be known. Some common causes of back pain include:  Strain of the muscles or ligaments supporting the spine.  Wear and tear (degeneration) of the spinal disks.  Arthritis.  Direct injury to the back. For many people, back pain may return. Since back pain is rarely dangerous, most people can learn to manage this condition on their own. HOME CARE INSTRUCTIONS Watch your back pain for any changes. The following actions may help to lessen any discomfort you are feeling:  Remain active. It is stressful on your back to sit or stand in one place for long periods of time. Do not sit, drive, or stand in one place for more than 30 minutes at a time. Take short walks on even surfaces as soon as you are able.Try to increase the length of time you walk each day.  Exercise regularly as directed by your health care provider. Exercise helps your back heal faster. It also helps avoid future injury by keeping your muscles strong and flexible.  Do not stay in bed.Resting more than 1-2 days can delay your recovery.  Pay attention to your body when you bend and lift. The most comfortable positions are those that put less stress on your recovering back. Always use proper lifting techniques, including:  Bending your  knees.  Keeping the load close to your body.  Avoiding twisting.  Find a comfortable position to sleep. Use a firm mattress and lie on your side with your knees slightly bent. If you lie on your back, put a pillow under your knees.  Avoid feeling anxious or stressed.Stress increases muscle tension and can worsen back pain.It is important to recognize when you are anxious or stressed and learn ways to manage it, such as with exercise.  Take medicines only as directed by your health care provider. Over-the-counter medicines to reduce pain and inflammation are often the most helpful.Your health care provider may prescribe muscle relaxant drugs.These medicines help dull your pain so you can more quickly return to your normal activities and healthy exercise.  Apply ice to the injured area:  Put ice in a plastic bag.  Place a towel between your skin and the bag.  Leave the ice on for 20 minutes, 2-3 times a day for the first 2-3 days. After that, ice and heat may be alternated to reduce pain and spasms.  Maintain a healthy weight. Excess weight puts extra stress on your back and makes it difficult to maintain good posture. SEEK MEDICAL CARE IF:  You have pain that is not relieved with rest or medicine.  You have increasing pain going down into the legs or buttocks.  You have pain that does not improve in one week.  You have night pain.  You lose weight.  You have a fever or chills. SEEK IMMEDIATE MEDICAL CARE IF:   You develop new bowel or bladder control problems.  You have unusual weakness or numbness in your arms or legs.  You develop nausea or vomiting.  You develop abdominal pain.  You feel faint.   This information is not intended to replace advice given to you by your health care provider. Make sure you discuss any questions you have with your health care provider.   Document Released: 06/30/2005 Document Revised: 07/21/2014 Document Reviewed: 11/01/2013 Elsevier  Interactive Patient Education [employer].

## 2016-01-25 ENCOUNTER — Other Ambulatory Visit: Payer: Self-pay | Admitting: Neurological Surgery

## 2016-01-30 NOTE — Progress Notes (Signed)
Spoke with Lorriane Shire to make MD aware to sign orders.

## 2016-02-01 ENCOUNTER — Ambulatory Visit (HOSPITAL_COMMUNITY)
Admission: RE | Admit: 2016-02-01 | Discharge: 2016-02-01 | Disposition: A | Discharge status: Home / Self Care | Payer: BLUE CROSS/BLUE SHIELD | Source: Ambulatory Visit | Attending: Neurological Surgery | Admitting: Neurological Surgery

## 2016-02-01 ENCOUNTER — Encounter (HOSPITAL_COMMUNITY): Payer: Self-pay

## 2016-02-01 ENCOUNTER — Encounter (HOSPITAL_COMMUNITY)
Admission: RE | Admit: 2016-02-01 | Discharge: 2016-02-01 | Disposition: A | Discharge status: Home / Self Care | Payer: BLUE CROSS/BLUE SHIELD | Source: Ambulatory Visit | Attending: Neurological Surgery | Admitting: Neurological Surgery

## 2016-02-01 DIAGNOSIS — Z01818 Encounter for other preprocedural examination: Secondary | ICD-10-CM | POA: Insufficient documentation

## 2016-02-01 DIAGNOSIS — Z0183 Encounter for blood typing: Secondary | ICD-10-CM | POA: Diagnosis not present

## 2016-02-01 DIAGNOSIS — Z79899 Other long term (current) drug therapy: Secondary | ICD-10-CM | POA: Insufficient documentation

## 2016-02-01 DIAGNOSIS — E7800 Pure hypercholesterolemia, unspecified: Secondary | ICD-10-CM | POA: Insufficient documentation

## 2016-02-01 DIAGNOSIS — J449 Chronic obstructive pulmonary disease, unspecified: Secondary | ICD-10-CM | POA: Insufficient documentation

## 2016-02-01 DIAGNOSIS — Z01812 Encounter for preprocedural laboratory examination: Secondary | ICD-10-CM | POA: Insufficient documentation

## 2016-02-01 DIAGNOSIS — E1165 Type 2 diabetes mellitus with hyperglycemia: Secondary | ICD-10-CM | POA: Insufficient documentation

## 2016-02-01 DIAGNOSIS — Z7984 Long term (current) use of oral hypoglycemic drugs: Secondary | ICD-10-CM | POA: Insufficient documentation

## 2016-02-01 DIAGNOSIS — M4310 Spondylolisthesis, site unspecified: Secondary | ICD-10-CM | POA: Insufficient documentation

## 2016-02-01 DIAGNOSIS — Z87891 Personal history of nicotine dependence: Secondary | ICD-10-CM | POA: Insufficient documentation

## 2016-02-01 DIAGNOSIS — E669 Obesity, unspecified: Secondary | ICD-10-CM | POA: Diagnosis not present

## 2016-02-01 DIAGNOSIS — Z85118 Personal history of other malignant neoplasm of bronchus and lung: Secondary | ICD-10-CM | POA: Diagnosis not present

## 2016-02-01 DIAGNOSIS — F329 Major depressive disorder, single episode, unspecified: Secondary | ICD-10-CM | POA: Insufficient documentation

## 2016-02-01 DIAGNOSIS — Z7982 Long term (current) use of aspirin: Secondary | ICD-10-CM | POA: Diagnosis not present

## 2016-02-01 DIAGNOSIS — Z9221 Personal history of antineoplastic chemotherapy: Secondary | ICD-10-CM | POA: Diagnosis not present

## 2016-02-01 DIAGNOSIS — Z6832 Body mass index (BMI) 32.0-32.9, adult: Secondary | ICD-10-CM | POA: Insufficient documentation

## 2016-02-01 HISTORY — DX: Pneumonia, unspecified organism: J18.9

## 2016-02-01 LAB — BASIC METABOLIC PANEL
ANION GAP: 9 (ref 5–15)
BUN: 17 mg/dL (ref 6–20)
CALCIUM: 9.6 mg/dL (ref 8.9–10.3)
CO2: 24 mmol/L (ref 22–32)
Chloride: 103 mmol/L (ref 101–111)
Creatinine, Ser: 0.81 mg/dL (ref 0.44–1.00)
GFR calc non Af Amer: >60 mL/min (ref >60)
GLUCOSE: 241 mg/dL — AB (ref 65–99)
Potassium: 3.9 mmol/L (ref 3.5–5.1)
Sodium: 136 mmol/L (ref 135–145)

## 2016-02-01 LAB — CBC WITH DIFFERENTIAL/PLATELET
BASOS PCT: 0 %
Basophils Absolute: 0 10*3/uL (ref 0.0–0.1)
Eosinophils Absolute: 0.1 10*3/uL (ref 0.0–0.7)
Eosinophils Relative: 2 %
HEMATOCRIT: 45.5 % (ref 36.0–46.0)
HEMOGLOBIN: 14.7 g/dL (ref 12.0–15.0)
LYMPHS PCT: 12 %
Lymphs Abs: 0.9 10*3/uL (ref 0.7–4.0)
MCH: 28.2 pg (ref 26.0–34.0)
MCHC: 32.3 g/dL (ref 30.0–36.0)
MCV: 87.3 fL (ref 78.0–100.0)
MONOS PCT: 9 %
Monocytes Absolute: 0.7 10*3/uL (ref 0.1–1.0)
NEUTROS ABS: 6.3 10*3/uL (ref 1.7–7.7)
NEUTROS PCT: 77 %
Platelets: 158 10*3/uL (ref 150–400)
RBC: 5.21 MIL/uL — ABNORMAL HIGH (ref 3.87–5.11)
RDW: 14.4 % (ref 11.5–15.5)
WBC: 8 10*3/uL (ref 4.0–10.5)

## 2016-02-01 LAB — GLUCOSE, CAPILLARY: Glucose-Capillary: 305 mg/dL — ABNORMAL HIGH (ref 65–99)

## 2016-02-01 LAB — TYPE AND SCREEN
ABO/RH(D): B NEG
ANTIBODY SCREEN: NEGATIVE

## 2016-02-01 LAB — PROTIME-INR
INR: 1 (ref 0.00–1.49)
Prothrombin Time: 13.4 seconds (ref 11.6–15.2)

## 2016-02-01 LAB — SURGICAL PCR SCREEN
MRSA, PCR: NEGATIVE
Staphylococcus aureus: NEGATIVE

## 2016-02-01 NOTE — Progress Notes (Addendum)
Derma cath for abnormal EKG  In [place], [place].  She states it came out "ok" 2015 Heart cath for "epigastric discomfort & poorly controlled diabetes" by Dr. Linard Millers.  Has not been back to see him, nor has she had any problems.  PCP is at [facility].  LOV 08/16/2105 Endocrinologist is Dr. Elyse Hsu    2011 is when she had lung surgery with Dr. Arlyce Dice.  Oncologist was Dr. Antionette Poles.  (He has since released her) Today's blood sugar was 305.  Patient states she had a donut for lunch and did NOT take her diabetes medication today.  I have stressed to her the importance of having her blood sugar in a therapeutic/save range for surgery.  She understands and agrees.  Her usual morning sugars run 160-200.  Her last Hgb A1C was back in March. Sample drawn today.

## 2016-02-02 LAB — HEMOGLOBIN A1C
HEMOGLOBIN A1C: 11.8 % — AB (ref 4.8–5.6)
MEAN PLASMA GLUCOSE: 292 mg/dL

## 2016-02-04 NOTE — Progress Notes (Addendum)
Anesthesia Chart Review: Patient is a 70 year old female scheduled for L3-4, L4-5 MAS, PLIF on 02/07/16 by Dr. [name].  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  - PCP is with [facility] at [place]. - Endocrinologist is Dr. Elyse Hsu. - Oncologist is Dr. [name], last visit 12/06/15. He discharged her to the survivorship clinic. - She was seen by cardiologist Dr. Ena Dawley on 06/01/14 for evaluation of 3V coronary calcification on CT with multiple CAD risk factors. Cardiac cath showed non-obstructive disease (see below). She did not have post-cath follow-up.   Meds include ASA 81 mg, Invokana, Celebrex, Neurontin, Norco, Zoloft, Janumet, Turmeric.  PAT Vitals: BP 106/62, HR 83, RR 18, T 36.6C, O2 sat 99%. CBG 305 (did not take DM meds today and ate a donut for lunch; reports fasting CBG ~160-200). BMI 32.95.   02/01/16 EKG: NSR, low voltage QRS, non-specific T wave abnormality. Anterior T wave abnormality is new/more pronounced when compared to 09/03/13 tracings.   07/12/14 Cardiac cath: LM is widely patent. LAD is heavily calcified in the proximal and mid vessel with 40% proximal narrowing. 50-60% mid narrowing after the origin of the large diagonal. No hemodynamically significant obstruction. LCX widely patent. Moderate calcification in the mid and distal vessel. There is 3 OM branches. OM2 is a dominant vessel that contains ectasia proximally but is otherwise normal. RCA is dominant and widely patent. Luminal irregularities are noted in the mid and distal vessel. EF 60%.  Impression:  1. Nonobstructive CAD.  2. 3V calcification. 3. Normal LV systolic function.  Recommendations: Risk factor modification.  07/10/14 Echo: Study Conclusions - Left ventricle: The cavity size was normal. There was  mild focalbasal hypertrophy of the septum. Systolic function was normal.The estimated ejection fraction was in the range of 55% to 60%. Wall motion was normal; there were no regional wall motion abnormalities. There was an increased relative contribution ofatrial contraction to ventricular filling. Doppler parameters are consistent with abnormal left ventricular relaxation (grade 1 diastolic dysfunction). - Atrial septum: There was increased thickness of the septum, consistent with lipomatous hypertrophy. - Pulmonic valve: There was trivial regurgitation.  02/01/16 CXR: IMPRESSION: No acute cardiopulmonary disease. Postsurgical changes involving the right lower lobe.  11/30/15 CT chest/abd/pelvis: IMPRESSION: 1. Stable postsurgical changes status post right lung wedge resection. No evidence of local recurrence. 2. Stable small mediastinal lymph nodes. No evidence of metastatic disease. 3. There is extensive atherosclerosis of the aorta, great vessels and coronary arteries. 4.Stable abdominal pelvic CT without suspicious findings. There is mild hepatic steatosis, bilateral renal cysts and aortoiliac atherosclerosis. 5. Pelvic floor laxity.  01/01/11 PFTs: Mild obstructive airways disease. No significant response to BD. Normal lung volumes. Diffusion moderately-severely reduced.  Preoperative labs noted. Non-fasting glucose 241. A1c is 11.8, consistent with average glucose of 292. Reported fasting glucose ~ 160-200.   No cardiology follow-up since 06/2014 cath, although only non-obstructive disease found. DM is not well controlled. Discussed with anesthesiologist Dr. Ermalene Postin. Patient is at high risk for perioperative complications, need for IV insulin, need for ICU given her uncontrolled DM and deficit in risk factor modification for known CAD. Ideally patient should go back to endocrinology and cardiology to get optimized prior to surgery if it is not considered urgent. I have notified Lorriane Shire at Dr.  [name]' office. He plans to contact Dr. Ermalene Postin to discuss further.  George Hugh [facility] Stay [facility]/Anesthesiology Phone [phone] 02/05/2016 11:26 AM

## 2016-02-05 ENCOUNTER — Telehealth: Payer: Self-pay | Admitting: *Deleted

## 2016-02-05 NOTE — Telephone Encounter (Signed)
Received a clearance request note from Dr [name] office at [facility].  Pt is scheduled to have lumbar fusion for this Thursday 02/07/16, and will need cardiac clearance per Dr [name] prior too this date.  Spoke with Surgery Scheduler Lorriane Shire, at Dr [name] office and informed her that this pt has not been seen in our office since 2015.  Also informed Lorriane Shire that Dr [name] is out of the office for the next 2 weeks as well.  Informed Lorriane Shire that we have very few slots still open on our Extender schedule, but the pt will need to be seen by a Provider in our office, so consent can be given to proceed with her Surgery. Lorriane Shire states that this is not an emergent Surgery, but the pt is in pain.  Informed Lorriane Shire that I will call the pt now and inform her of new plan and attempt to schedule her an appt on our Extender schedule.  Informed Lorriane Shire I will call her back with a date the pt agrees to come into the office to be seen by an Extender for cardiac clearance needed.  Lorriane Shire verbalized understanding and completely agrees with this plan.

## 2016-02-05 NOTE — Telephone Encounter (Signed)
Left a detailed message for the pt to call back to arrange an appt with one of our Extenders this week, for cardiac clearance needed for her upcoming back surgery.  Surgery Scheduler Lorriane Shire is aware that the pt did not answer, and a VM was left for her to call back.  Per Lorriane Shire, she will continue to follow-up with the pt, and have her call our office back, to schedule an appt for cardiac clearance.  Lorriane Shire was gracious for all the assistance provided.

## 2016-02-05 NOTE — Telephone Encounter (Signed)
Follow-up      The pt is returning Ivy's call.

## 2016-02-05 NOTE — Telephone Encounter (Signed)
Spoke with the pt and she agreed to come into the office to see a Provider for cardiac clearance.  Pt is scheduled to see Truitt Merle NP for tomorrow 7/26 at 3 pm at our office.  Pt understands that there may be further testing ordered by Cecille Rubin before she can clear her for Sx.  Informed the pt that this could possibly delay her scheduled Sx for 7/27.  Pt verbalized understanding and agrees with this plan.  [place] [facility] Scheduler at Dr [name] office is aware that I made contact with the pt, and she will be seen tomorrow 7/26 with Cecille Rubin at [place] that someone will fax the clearance note to her number provided, once work-up is complete. Will route this message to [place] and covering [place] as an Pharmacist, hospital.

## 2016-02-06 ENCOUNTER — Ambulatory Visit (INDEPENDENT_AMBULATORY_CARE_PROVIDER_SITE_OTHER): Payer: BLUE CROSS/BLUE SHIELD | Admitting: Nurse Practitioner

## 2016-02-06 ENCOUNTER — Telehealth: Payer: Self-pay | Admitting: *Deleted

## 2016-02-06 ENCOUNTER — Encounter: Payer: Self-pay | Admitting: Nurse Practitioner

## 2016-02-06 VITALS — BP 90/60 | HR 89 | Ht 61.0 in | Wt 163.1 lb

## 2016-02-06 DIAGNOSIS — R9431 Abnormal electrocardiogram [ECG] [EKG]: Secondary | ICD-10-CM

## 2016-02-06 DIAGNOSIS — I259 Chronic ischemic heart disease, unspecified: Secondary | ICD-10-CM | POA: Diagnosis not present

## 2016-02-06 DIAGNOSIS — Z01810 Encounter for preprocedural cardiovascular examination: Secondary | ICD-10-CM | POA: Diagnosis not present

## 2016-02-06 MED ORDER — VANCOMYCIN HCL IN DEXTROSE 1-5 GM/200ML-% IV SOLN: 1000.0000 mg | INTRAVENOUS | Status: AC

## 2016-02-06 MED ORDER — DEXAMETHASONE SODIUM PHOSPHATE 10 MG/ML IJ SOLN
10.0000 mg | INTRAMUSCULAR | Status: AC
Start: 2016-02-07 — End: 2016-02-08

## 2016-02-06 NOTE — Patient Instructions (Addendum)
We will be checking the following labs today - NONE   Medication Instructions:    Continue with your current medicines.     Testing/Procedures To Be Arranged:  Lexiscan Myoview - try to do as soon as possible.  Follow-Up:   Will see how your stress test turns out and then decide about follow up.     Other Special Instructions:   Call Dr. [name] and let him know you cannot have your surgery tomorrow - will need to reschedule if your stress test turns out ok.     If you need a refill on your cardiac medications before your next appointment, please call your pharmacy.   Call the [place] office at [phone] if you have any questions, problems or concerns.

## 2016-02-06 NOTE — Progress Notes (Signed)
CARDIOLOGY OFFICE NOTE  Date:  02/06/2016    [name] Date of Birth: June 15, 1946 Medical Record #[id number]  PCP:  [name], MD  Cardiologist:  [name]   Chief Complaint  Patient presents with  . Pre-op Exam    Surgical clearance visit - seen for [name]    History of Present Illness: Amy Shepard is a 70 y.o. female who presents today for a pre op visit. Seen for [name]. She has not been here in over 2 years.   She has had prior lung cancer, has COPD, depression, DM, and known coronary calcification. Has had cardiac catheterization showing nonobstructive disease with a 40 to 60% LAD - risk factor modification was recommended.   Phone call yesterday - "Received a clearance request note from Dr [name] office at [facility].  Pt is scheduled to have lumbar fusion for this Thursday 02/07/16, and will need cardiac clearance per Dr [name] prior too this date.  Spoke with Surgery Scheduler Lorriane Shire, at Dr [name] office and informed her that this pt has not been seen in our office since 2015.  Also informed Lorriane Shire that Dr [name] is out of the office for the next 2 weeks as well.  Informed Lorriane Shire that we have very few slots still open on our Extender schedule, but the pt will need to be seen by a Provider in our office, so consent can be given to proceed with her Surgery. Lorriane Shire states that this is not an emergent Surgery, but the pt is in pain.  Informed Lorriane Shire that I will call the pt now and inform her of new plan and attempt to schedule her an appt on our Extender schedule.  Informed Lorriane Shire I will call her back with a date the pt agrees to come into the office to be seen by an Extender for cardiac clearance needed.  Lorriane Shire verbalized understanding and completely agrees with this plan".   Thus added to my schedule.    Comes in today. Here alone.   Says she was sent here due to her pre op visit. She says she is doing ok.  Apparently had a fall several weeks ago - now needs a back fusion - this is scheduled for tomorrow.  She has had an EKG as part of her pre op - shows new T wave changes in V2 and V3. A1C shows her diabetes to be poorly controlled. She says she does not have a provider that she is currently seeing to address her diabetes. She denies chest pain. She has exertional DOE. She says this is unchanged. She is not oxygen. No smoking since 2008. 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 She has to use a walker to ambulate. She is unaware of her A1C results. BP seems to run on the low side at home.   Past Medical History:  Diagnosis Date  . Cancer ([place])    lung ca  . COPD (chronic obstructive pulmonary disease) ([place])   . Depression   . Diabetes mellitus    [name]    dx  2008  . Hypercholesteremia   . Lung cancer ([place])   . Pneumonia    2008  @  [name]  . Shortness of breath dyspnea    doe    Past Surgical History:  Procedure Laterality Date  . [place] [facility] [place]  [facility] in [place]     bilateral cataracts  . LEFT HEART CATHETERIZATION WITH CORONARY ANGIOGRAM N/A 07/12/2014   Procedure: LEFT HEART CATHETERIZATION WITH CORONARY ANGIOGRAM;  Surgeon: [name], MD;  Location: [facility] CATH LAB;  Service: Cardiovascular;  Laterality: N/A;  . THORACOTOMY    . TONSILLECTOMY       Medications: Current Outpatient Prescriptions  Medication Sig Dispense Refill  . aspirin EC 81 MG tablet Take 81 mg by mouth every evening.    . canagliflozin (INVOKANA) 100 MG TABS tablet Take 300 mg by mouth daily before breakfast.     . celecoxib (CELEBREX) 100 MG capsule Take 100 mg by mouth 2 (two) times daily.  1  . gabapentin (NEURONTIN) 300 MG capsule Take 300 mg by mouth 3 (three) times daily.    [name] HYDROcodone-acetaminophen (NORCO/VICODIN) 5-325 MG tablet Take 1 tablet by mouth every 6 (six) hours as needed.  (Patient taking differently: Take 1 tablet by mouth every 6 (six) hours as needed for moderate pain. ) 12 tablet 0  . Liraglutide (VICTOZA) 18 MG/3ML SOPN Inject 1.8 mg into the skin daily.    . naproxen sodium (ANAPROX) 220 MG tablet Take 440 mg by mouth 2 (two) times daily as needed (for back pain).    [name] sertraline (ZOLOFT) 100 MG tablet Take 100 mg by mouth daily.    . sitaGLIPtin-metformin (JANUMET) 50-1000 MG per tablet Take 1 tablet by mouth 2 (two) times daily with a meal.    . TURMERIC PO Take 1 capsule by mouth daily.     No current facility-administered medications for this visit.    Facility-Administered Medications Ordered in Other Visits  Medication Dose Route Frequency Provider Last Rate Last Dose  . [START ON 02/07/2016] dexamethasone (DECADRON) injection 10 mg  10 mg Intravenous To SS-Surg [name], MD      . diatrizoate meglumine-sodium (GASTROGRAFIN) 66-10 % solution 30 mL  30 mL Oral PRN Bjorn Loser, MD   30 mL at 11/30/15 0820  . [START ON 02/07/2016] vancomycin (VANCOCIN) IVPB 1000 mg/200 mL premix  1,000 mg Intravenous To SS-Surg [name], MD        Allergies: Allergies  Allergen Reactions  . Amoxicillin Anaphylaxis, Hives and Rash    Has patient had a PCN reaction causing immediate rash, facial/tongue/throat swelling, SOB or lightheadedness with hypotension: YES Positive reaction causing SEVERE RASH INVOLVING MUCUS MEMBRANES/SKIN NECROSIS: YES Reaction that required HOSPITALIZATION: YES Reaction occurring within the last 10 years: NO    Social History: The patient  reports that she quit smoking about 9 years ago. She has a 30.00 pack-year smoking history. She has never used smokeless tobacco. She reports that she does not drink alcohol or use drugs.   Family History: The patient's family history includes Cancer in her sister; Diabetes in her brother and father; Heart failure in her brother and father.   Review of Systems: Please see the history of  present illness.   Otherwise, the review of systems is positive for none.   All other systems are reviewed and negative.   Physical Exam: VS:  BP 90/60   Pulse 89   Ht _0  (1.549 m)   Wt 163 lb 1.9 oz (74 kg)   SpO2 (!) 88%   BMI 30.82 kg/m  .  BMI Body mass index is 30.82 kg/m.  Wt Readings from Last 3 Encounters:  02/06/16 163 lb 1.9 oz (74 kg)  12/06/15 174  lb 6.4 oz (79.1 kg)  12/04/14 187 lb 1.6 oz (84.9 kg)    General: Pleasant but she looks chronically ill. She is alert and in no acute distress. She is overweight.   HEENT: Normal.  Neck: Supple, no JVD, carotid bruits, or masses noted.  Cardiac: Regular rate and rhythm.  No edema.  Respiratory:  Lungs are clear to auscultation bilaterally with normal work of breathing.  GI: Soft and nontender.  MS: No deformity or atrophy. Gait and ROM intact. Using a walker.  Skin: Warm and dry. Color is normal but her face is quite [place].  Neuro:  Strength and sensation are intact and no gross focal deficits noted.  Psych: Alert, appropriate and with normal affect.   LABORATORY DATA:  EKG:  EKG is not ordered today. EKG from 02/01/16 was reviewed. Shows NSR with new anterior T wave changes.   Lab Results  Component Value Date   WBC 8.0 02/01/2016   HGB 14.7 02/01/2016   HCT 45.5 02/01/2016   PLT 158 02/01/2016   GLUCOSE 241 (H) 02/01/2016   ALT 16 11/30/2015   AST 13 11/30/2015   NA 136 02/01/2016   K 3.9 02/01/2016   CL 103 02/01/2016   CREATININE 0.81 02/01/2016   BUN 17 02/01/2016   CO2 24 02/01/2016   INR 1.00 02/01/2016   HGBA1C 11.8 (H) 02/01/2016    BNP (last 3 results) No results for input(s): BNP in the last 8760 hours.  ProBNP (last 3 results) No results for input(s): PROBNP in the last 8760 hours.   Other Studies Reviewed Today:  ANGIOGRAPHIC DATA FROM 06/2014:    The left main coronary artery is  widely patent.  The left anterior descending artery is heavily calcified in the proximal and mid  vessel. There is eccentric 40% proximal narrowing. There is 50-60% mid narrowing after the origin of the large diagonal. No hemodynamically significant obstruction is noted in the LAD.  The left circumflex artery is widely patent. There is moderate calcification in the mid and distal vessel.  There is 3 obtuse marginal branches. The second marginal is a dominant vessel that contains ectasia proximally but is otherwise normal.  The right coronary arterydominant and widely patent. Luminal irregularities are noted in the mid and distal vessel. [name]   LEFT VENTRICULOGRAM:  Left ventricular angiogram was done in the 30 RAO projection and revealed normal LV cavity size and EF of 60%   IMPRESSIONS:   1. Nonobstructive coronary artery disease 2. Three-vessel coronary calcification 3. Normal left ventricular systolic function   RECOMMENDATION: Risk factor modification.    Assessment/Plan: 1. Pre op clearance - not given today. EKG with new T wave changes. Mildly hypoxic as well. Seems to be unaware of her diabetic status. Fortunately, no chest pain but does have known CAD. Arranging Alligator. Surgery will need to be postponed.   2. Known coronary disease - arranging for Myoview.   3. Uncontrolled DM - may need medical clearance/assistance in light of the abnormal A1C  4. HLD - no longer on statin - she says due to cost.   Current medicines are reviewed with the patient today.  The patient does not have concerns regarding medicines other than what has been noted above.  The following changes have been made:  See above.  Labs/ tests ordered today include:    Orders Placed This Encounter  Procedures  . Myocardial Perfusion Imaging     Disposition:   Further disposition to follow.  Patient is agreeable to this plan and will call if any problems develop in the interim.   Signed: Burtis Junes, RN, ANP-C 02/06/2016 3:26 PM  [facility] [address] [place], [place]  [address] Phone: [phone] Fax: [phone]

## 2016-02-06 NOTE — Telephone Encounter (Signed)
Faxed over Truitt Merle, NP ov note to neurosurgery to Dr. Stevenson Clinch office.  Did not approve pre-op clearance.

## 2016-02-07 ENCOUNTER — Ambulatory Visit (HOSPITAL_COMMUNITY): Payer: BLUE CROSS/BLUE SHIELD | Attending: Cardiology

## 2016-02-07 VITALS — Ht 61.0 in | Wt 163.0 lb

## 2016-02-07 DIAGNOSIS — R9439 Abnormal result of other cardiovascular function study: Secondary | ICD-10-CM | POA: Insufficient documentation

## 2016-02-07 DIAGNOSIS — Z01810 Encounter for preprocedural cardiovascular examination: Secondary | ICD-10-CM | POA: Diagnosis present

## 2016-02-07 DIAGNOSIS — I259 Chronic ischemic heart disease, unspecified: Secondary | ICD-10-CM

## 2016-02-07 DIAGNOSIS — E119 Type 2 diabetes mellitus without complications: Secondary | ICD-10-CM | POA: Diagnosis not present

## 2016-02-07 DIAGNOSIS — R9431 Abnormal electrocardiogram [ECG] [EKG]: Secondary | ICD-10-CM | POA: Diagnosis not present

## 2016-02-07 DIAGNOSIS — R0609 Other forms of dyspnea: Secondary | ICD-10-CM | POA: Diagnosis not present

## 2016-02-07 LAB — MYOCARDIAL PERFUSION IMAGING
LV dias vol: 52 mL (ref 46–106)
LV sys vol: 12 mL
Peak HR: 88 {beats}/min
RATE: 0.3
Rest HR: 78 {beats}/min
SDS: 1
SRS: 9
SSS: 10
TID: 1.33

## 2016-02-07 MED ORDER — REGADENOSON 0.4 MG/5ML IV SOLN
0.4000 mg | Freq: Once | INTRAVENOUS | Status: AC
  Administered 2016-02-07: 0.4 mg via INTRAVENOUS

## 2016-02-07 MED ORDER — TECHNETIUM TC 99M TETROFOSMIN IV KIT
10.7000 | PACK | Freq: Once | INTRAVENOUS | Status: AC | PRN
  Administered 2016-02-07: 11 via INTRAVENOUS
  Filled 2016-02-07: qty 11

## 2016-02-07 MED ORDER — TECHNETIUM TC 99M TETROFOSMIN IV KIT
33.0000 | PACK | Freq: Once | INTRAVENOUS | Status: AC | PRN
  Administered 2016-02-07: 33 via INTRAVENOUS
  Filled 2016-02-07: qty 33

## 2016-02-14 NOTE — Progress Notes (Addendum)
Anesthesia follow-up: See my anesthesia note from 02/05/16 (PAT 02/01/16). L3-4, L4-5 PLIF with Dr. [name] was postponed to allow for cardiology evaluation. Dr. [name]' staff were also advised that she is high risk for cancellation, need for IV insulin, and ICU stay due to poorly controlled DM (A1c 11.8 on 02/01/16). Her endocrinologist is Dr. Elyse Hsu, last visit 08/16/15. Current DM meds include Invokana, Victoza, Janumet. His note mentions she has had issues with compliance due to forgetfulness, trouble swallowing pills, cost, poor adherence to diet. She reports that she was never asked to follow-up with her endocrinologist before rescheduling her surgery by Dr. [name]' office, so this has not happened. She reports a fasting glucose between 122-159.   Since my last note, patient was evaluated by Truitt Merle, NP with [name]. Following a low risk stress test, patient was cleared from a cardiac standpoint.  02/07/16 Nuclear stress test:  Nuclear stress EF: 76%. No wall motion abnormalities  There was no ST segment deviation noted during stress.  Defect 1: There is a small defect of mild severity present in the apex location. No ischemia identified  This is a low risk study.   Preoperative labs noted. Cr 0.80. CBC WNL. Glucose 179. I previously notified Dr. [name]' office of patient's poorly controlled DM. From an anesthesia standpoint, she can likely proceed to OR if her fasting CBG on arrival is 200 or less. However, she will still need close management of her DM peri/post-operatively. As above, she could require IV insulin or even ICU stay. She would likely benefit for a Hospitalist consult post-operatively to assist with DM management--that will be deferred to surgeon. I did, however, notify the in-patient DM team of patient's planned admission so a DME RN can follow-up with patient during her hospitalization.    George Hugh [facility] Short Stay Center/Anesthesiology Phone [phone] 02/20/2016 10:19 AM

## 2016-02-19 ENCOUNTER — Encounter (HOSPITAL_COMMUNITY)
Admission: RE | Admit: 2016-02-19 | Discharge: 2016-02-19 | Disposition: A | Discharge status: Home / Self Care | Payer: BLUE CROSS/BLUE SHIELD | Source: Ambulatory Visit | Attending: Neurological Surgery | Admitting: Neurological Surgery

## 2016-02-19 ENCOUNTER — Encounter (HOSPITAL_COMMUNITY): Payer: Self-pay

## 2016-02-19 ENCOUNTER — Other Ambulatory Visit (HOSPITAL_COMMUNITY): Payer: Self-pay | Admitting: *Deleted

## 2016-02-19 HISTORY — DX: Essential (primary) hypertension: I10

## 2016-02-19 LAB — BASIC METABOLIC PANEL
ANION GAP: 7 (ref 5–15)
BUN: 22 mg/dL — ABNORMAL HIGH (ref 6–20)
CO2: 23 mmol/L (ref 22–32)
Calcium: 9.8 mg/dL (ref 8.9–10.3)
Chloride: 106 mmol/L (ref 101–111)
Creatinine, Ser: 0.8 mg/dL (ref 0.44–1.00)
GFR calc Af Amer: >60 mL/min (ref >60)
GFR calc non Af Amer: >60 mL/min (ref >60)
GLUCOSE: 179 mg/dL — AB (ref 65–99)
POTASSIUM: 4.4 mmol/L (ref 3.5–5.1)
SODIUM: 136 mmol/L (ref 135–145)

## 2016-02-19 LAB — TYPE AND SCREEN
ABO/RH(D): B NEG
ANTIBODY SCREEN: NEGATIVE

## 2016-02-19 LAB — CBC
HEMATOCRIT: 44.6 % (ref 36.0–46.0)
HEMOGLOBIN: 14.8 g/dL (ref 12.0–15.0)
MCH: 29.2 pg (ref 26.0–34.0)
MCHC: 33.2 g/dL (ref 30.0–36.0)
MCV: 88 fL (ref 78.0–100.0)
Platelets: 181 10*3/uL (ref 150–400)
RBC: 5.07 MIL/uL (ref 3.87–5.11)
RDW: 14.5 % (ref 11.5–15.5)
WBC: 6.8 10*3/uL (ref 4.0–10.5)

## 2016-02-19 LAB — GLUCOSE, CAPILLARY: Glucose-Capillary: 186 mg/dL — ABNORMAL HIGH (ref 65–99)

## 2016-02-19 NOTE — Pre-Procedure Instructions (Signed)
Amy Shepard  02/19/2016     Your procedure is scheduled on Friday, February 22, 2016 at 11:40 AM.   Report to [facility] Entrance "A" Admitting Office at 9:40 AM.   Call this number if you have problems the morning of surgery: [phone]   Any questions prior to day of surgery, please call [phone] between 8 & 4 PM.   Remember:  Do not eat food or drink liquids after midnight Thursday, 02/21/16.  Take these medicines the morning of surgery with A SIP OF WATER: Gabapentin (Neurontin), Sertraline (Zoloft), Hydrocodone - if needed  Stop Aspirin and NSAIDS (Naprosyn, Celebrex, Ibuprofen, etc.) as of today.    How to Manage Your Diabetes Before Surgery  Why is it important to control my blood sugar before and after surgery?   Improving blood sugar levels before and after surgery helps healing and can limit problems.  A way of improving blood sugar control is eating a healthy diet by:  - Eating less sugar and carbohydrates  - Increasing activity/exercise  - Talk with your doctor about reaching your blood sugar goals  High blood sugars (greater than 180 mg/dL) can raise your risk of infections and slow down your recovery so you will need to focus on controlling your diabetes during the weeks before surgery.  Make sure that the doctor who takes care of your diabetes knows about your planned surgery including the date and location.  How do I manage my blood sugars before surgery?   Check your blood sugar at least 4 times a day, 2 days before surgery to make sure that they are not too high or low.  Check your blood sugar the morning of your surgery when you wake up and every 2 hours until you get to the Short-Stay unit.  Treat a low blood sugar (less than 70 mg/dL) with 1/2 cup of clear juice (cranberry or apple), 4 glucose tablets, OR glucose gel.  Recheck blood sugar in 15 minutes after treatment (to make sure it is greater than 70 mg/dL).  If blood sugar is  not greater than 70 mg/dL on re-check, call [phone] for further instructions.   Report your blood sugar to the Short-Stay nurse when you get to Short-Stay.  References:  [employer] [facility], 2007 "How to Manage your Diabetes Before and After Surgery".  What do I do about my diabetes medications?   Do not take oral diabetes medicines (pills) the morning of surgery.  Do not take Victoza the morning of surgery   Do not wear jewelry, make-up or nail polish.  Do not wear lotions, powders, or perfumes.  You may wear deodorant.  Do not shave 48 hours prior to surgery.    Do not bring valuables to the hospital.  [facility] is not responsible for any belongings or valuables.  Contacts, dentures or bridgework may not be worn into surgery.  Leave your suitcase in the car.  After surgery it may be brought to your room.  For patients admitted to the hospital, discharge time will be determined by your treatment team.  Special instructions:  [place] - Preparing for Surgery  Before surgery, you can play an important role.  Because skin is not sterile, your skin needs to be as free of germs as possible.  You can reduce the number of germs on you skin by washing with CHG (chlorahexidine gluconate) soap before surgery.  CHG is an antiseptic cleaner which kills germs and bonds with  the skin to continue killing germs even after washing.  Please DO NOT use if you have an allergy to CHG or antibacterial soaps.  If your skin becomes reddened/irritated stop using the CHG and inform your nurse when you arrive at Short Stay.  Do not shave (including legs and underarms) for at least 48 hours prior to the first CHG shower.  You may shave your face.  Please follow these instructions carefully:   1.  Shower with CHG Soap the night before surgery and the         morning of Surgery.  2.  If you choose to wash your hair, wash your hair first as usual with your       normal shampoo.  3.   After you shampoo, rinse your hair and body thoroughly to remove the shampoo.  4.  Use CHG as you would any other liquid soap.  You can apply chg directly       to the skin and wash gently with scrungie or a clean washcloth.  5.  Apply the CHG Soap to your body ONLY FROM THE NECK DOWN.        Do not use on open wounds or open sores.  Avoid contact with your eyes, ears, mouth and genitals (private parts).  Wash genitals (private parts) with your normal soap.  6.  Wash thoroughly, paying special attention to the area where your surgery        will be performed.  7.  Thoroughly rinse your body with warm water from the neck down.  8.  DO NOT shower/wash with your normal soap after using and rinsing off       the CHG Soap.  9.  Pat yourself dry with a clean towel.            10.  Wear clean pajamas.            11.  Place clean sheets on your bed the night of your first shower and do not        sleep with pets.  Day of Surgery  Do not apply any lotions the morning of surgery.  Please wear clean clothes to the hospital.   Please read over the following fact sheets that you were given. Pain Booklet, Coughing and Deep Breathing and Surgical Site Infection Prevention

## 2016-02-19 NOTE — Progress Notes (Signed)
Pt has received cardiac clearance. She states she did not follow up with endocrinologist because she wasn't instructed to do so. Pt states her fasting blood sugar has been been 122-159. States it was 159 this AM, she took her meds with unsweet tea and CBG here was 180. A1C on 02/01/16 was 11.8.

## 2016-02-22 ENCOUNTER — Encounter (HOSPITAL_COMMUNITY): Payer: Self-pay | Admitting: Certified Registered Nurse Anesthetist

## 2016-02-22 ENCOUNTER — Inpatient Hospital Stay (HOSPITAL_COMMUNITY): Payer: BLUE CROSS/BLUE SHIELD

## 2016-02-22 ENCOUNTER — Encounter (HOSPITAL_COMMUNITY)
Admission: RE | Disposition: A | Discharge status: Home Health | Payer: Self-pay | Source: Ambulatory Visit | Attending: Neurological Surgery

## 2016-02-22 ENCOUNTER — Inpatient Hospital Stay (HOSPITAL_COMMUNITY)
Admission: RE | Admit: 2016-02-22 | Discharge: 2016-02-24 | DRG: 460 | Disposition: A | Discharge status: Home Health | Payer: BLUE CROSS/BLUE SHIELD | Source: Ambulatory Visit | Attending: Neurological Surgery | Admitting: Neurological Surgery

## 2016-02-22 ENCOUNTER — Inpatient Hospital Stay (HOSPITAL_COMMUNITY): Payer: BLUE CROSS/BLUE SHIELD | Admitting: Emergency Medicine

## 2016-02-22 ENCOUNTER — Inpatient Hospital Stay (HOSPITAL_COMMUNITY): Payer: BLUE CROSS/BLUE SHIELD | Admitting: Vascular Surgery

## 2016-02-22 DIAGNOSIS — Z880 Allergy status to penicillin: Secondary | ICD-10-CM

## 2016-02-22 DIAGNOSIS — F329 Major depressive disorder, single episode, unspecified: Secondary | ICD-10-CM | POA: Diagnosis present

## 2016-02-22 DIAGNOSIS — Z85118 Personal history of other malignant neoplasm of bronchus and lung: Secondary | ICD-10-CM

## 2016-02-22 DIAGNOSIS — I10 Essential (primary) hypertension: Secondary | ICD-10-CM | POA: Diagnosis present

## 2016-02-22 DIAGNOSIS — Z981 Arthrodesis status: Secondary | ICD-10-CM

## 2016-02-22 DIAGNOSIS — M549 Dorsalgia, unspecified: Secondary | ICD-10-CM | POA: Diagnosis present

## 2016-02-22 DIAGNOSIS — E119 Type 2 diabetes mellitus without complications: Secondary | ICD-10-CM | POA: Diagnosis present

## 2016-02-22 DIAGNOSIS — M4806 Spinal stenosis, lumbar region: Secondary | ICD-10-CM | POA: Diagnosis present

## 2016-02-22 DIAGNOSIS — J449 Chronic obstructive pulmonary disease, unspecified: Secondary | ICD-10-CM | POA: Diagnosis present

## 2016-02-22 DIAGNOSIS — M7130 Other bursal cyst, unspecified site: Secondary | ICD-10-CM | POA: Diagnosis present

## 2016-02-22 DIAGNOSIS — Z87891 Personal history of nicotine dependence: Secondary | ICD-10-CM | POA: Diagnosis not present

## 2016-02-22 DIAGNOSIS — Z7982 Long term (current) use of aspirin: Secondary | ICD-10-CM | POA: Diagnosis not present

## 2016-02-22 DIAGNOSIS — Z419 Encounter for procedure for purposes other than remedying health state, unspecified: Secondary | ICD-10-CM

## 2016-02-22 DIAGNOSIS — Z7984 Long term (current) use of oral hypoglycemic drugs: Secondary | ICD-10-CM

## 2016-02-22 HISTORY — PX: MAXIMUM ACCESS (MAS)POSTERIOR LUMBAR INTERBODY FUSION (PLIF) 2 LEVEL: SHX6369

## 2016-02-22 LAB — GLUCOSE, CAPILLARY
Glucose-Capillary: 158 mg/dL — ABNORMAL HIGH (ref 65–99)
Glucose-Capillary: 144 mg/dL — ABNORMAL HIGH (ref 65–99)

## 2016-02-22 SURGERY — FOR MAXIMUM ACCESS (MAS) POSTERIOR LUMBAR INTERBODY FUSION (PLIF) 2 LEVEL
Anesthesia: General | Site: Back

## 2016-02-22 MED ORDER — ACETAMINOPHEN 650 MG RE SUPP
650.0000 mg | RECTAL | Status: DC | PRN
Start: 2016-02-22 — End: 2016-02-24

## 2016-02-22 MED ORDER — ACETAMINOPHEN 10 MG/ML IV SOLN
INTRAVENOUS | Status: DC | PRN
  Administered 2016-02-22: 1000 mg via INTRAVENOUS

## 2016-02-22 MED ORDER — THROMBIN 20000 UNITS EX SOLR
CUTANEOUS | Status: DC | PRN
  Administered 2016-02-22: 12:00:00 via TOPICAL

## 2016-02-22 MED ORDER — TURMERIC 500 MG PO CAPS: ORAL_CAPSULE | Freq: Every day | ORAL | Status: DC

## 2016-02-22 MED ORDER — OXYCODONE-ACETAMINOPHEN 5-325 MG PO TABS
1.0000 | ORAL_TABLET | ORAL | Status: DC | PRN
  Administered 2016-02-23 (×2): 1 via ORAL
  Filled 2016-02-22 (×3): qty 1

## 2016-02-22 MED ORDER — PROMETHAZINE HCL 25 MG/ML IJ SOLN: 6.2500 mg | INTRAMUSCULAR | Status: DC | PRN

## 2016-02-22 MED ORDER — LACTATED RINGERS IV SOLN
INTRAVENOUS | Status: DC
  Administered 2016-02-22 (×2): via INTRAVENOUS

## 2016-02-22 MED ORDER — SODIUM CHLORIDE 0.9 % IV SOLN: 250.0000 mL | INTRAVENOUS | Status: DC

## 2016-02-22 MED ORDER — VANCOMYCIN HCL IN DEXTROSE 1-5 GM/200ML-% IV SOLN
1000.0000 mg | Freq: Two times a day (BID) | INTRAVENOUS | Status: DC
  Administered 2016-02-23 – 2016-02-24 (×3): 1000 mg via INTRAVENOUS
  Filled 2016-02-22 (×5): qty 200

## 2016-02-22 MED ORDER — ACETAMINOPHEN 325 MG PO TABS: 650.0000 mg | ORAL_TABLET | ORAL | Status: DC | PRN

## 2016-02-22 MED ORDER — LIRAGLUTIDE 18 MG/3ML ~~LOC~~ SOPN: 1.8000 mg | PEN_INJECTOR | Freq: Every day | SUBCUTANEOUS | Status: DC

## 2016-02-22 MED ORDER — METHOCARBAMOL 1000 MG/10ML IJ SOLN: 500.0000 mg | Freq: Four times a day (QID) | INTRAVENOUS | Status: DC | PRN

## 2016-02-22 MED ORDER — FENTANYL CITRATE (PF) 250 MCG/5ML IJ SOLN
INTRAMUSCULAR | Status: AC
  Filled 2016-02-22: qty 5

## 2016-02-22 MED ORDER — SUCCINYLCHOLINE CHLORIDE 20 MG/ML IJ SOLN
INTRAMUSCULAR | Status: DC | PRN
  Administered 2016-02-22: 100 mg via INTRAVENOUS

## 2016-02-22 MED ORDER — SERTRALINE HCL 100 MG PO TABS
100.0000 mg | ORAL_TABLET | Freq: Every day | ORAL | Status: DC
  Administered 2016-02-23 – 2016-02-24 (×2): 100 mg via ORAL
  Filled 2016-02-22 (×2): qty 1

## 2016-02-22 MED ORDER — ACETAMINOPHEN 10 MG/ML IV SOLN
INTRAVENOUS | Status: AC
  Filled 2016-02-22: qty 100

## 2016-02-22 MED ORDER — PHENYLEPHRINE HCL 10 MG/ML IJ SOLN
INTRAMUSCULAR | Status: DC | PRN
  Administered 2016-02-22 (×5): 80 ug via INTRAVENOUS

## 2016-02-22 MED ORDER — LIDOCAINE HCL (CARDIAC) 20 MG/ML IV SOLN
INTRAVENOUS | Status: DC | PRN
  Administered 2016-02-22: 60 mg via INTRAVENOUS

## 2016-02-22 MED ORDER — METFORMIN HCL 500 MG PO TABS
1000.0000 mg | ORAL_TABLET | Freq: Two times a day (BID) | ORAL | Status: DC
  Administered 2016-02-23 – 2016-02-24 (×3): 1000 mg via ORAL
  Filled 2016-02-22 (×3): qty 2

## 2016-02-22 MED ORDER — VANCOMYCIN HCL IN DEXTROSE 1-5 GM/200ML-% IV SOLN
INTRAVENOUS | Status: AC
  Filled 2016-02-22: qty 200

## 2016-02-22 MED ORDER — CHLORHEXIDINE GLUCONATE CLOTH 2 % EX PADS: 6.0000 | MEDICATED_PAD | Freq: Once | CUTANEOUS | Status: DC

## 2016-02-22 MED ORDER — MENTHOL 3 MG MT LOZG: 1.0000 | LOZENGE | OROMUCOSAL | Status: DC | PRN

## 2016-02-22 MED ORDER — SITAGLIPTIN PHOS-METFORMIN HCL 50-1000 MG PO TABS: 1.0000 | ORAL_TABLET | Freq: Two times a day (BID) | ORAL | Status: DC

## 2016-02-22 MED ORDER — LACTATED RINGERS IV SOLN
INTRAVENOUS | Status: DC | PRN
  Administered 2016-02-22 (×2): via INTRAVENOUS

## 2016-02-22 MED ORDER — LINAGLIPTIN 5 MG PO TABS
5.0000 mg | ORAL_TABLET | Freq: Every day | ORAL | Status: DC
  Administered 2016-02-23 – 2016-02-24 (×2): 5 mg via ORAL
  Filled 2016-02-22 (×2): qty 1

## 2016-02-22 MED ORDER — SODIUM CHLORIDE 0.9 % IR SOLN
Status: DC | PRN
  Administered 2016-02-22: 11:00:00

## 2016-02-22 MED ORDER — ONDANSETRON HCL 4 MG/2ML IJ SOLN
INTRAMUSCULAR | Status: AC
  Filled 2016-02-22: qty 2

## 2016-02-22 MED ORDER — HYDROMORPHONE HCL 1 MG/ML IJ SOLN
0.2500 mg | INTRAMUSCULAR | Status: DC | PRN
  Administered 2016-02-22 (×2): 0.25 mg via INTRAVENOUS

## 2016-02-22 MED ORDER — ONDANSETRON HCL 4 MG/2ML IJ SOLN
4.0000 mg | INTRAMUSCULAR | Status: DC | PRN
Start: 2016-02-22 — End: 2016-02-24

## 2016-02-22 MED ORDER — HYDROMORPHONE HCL 1 MG/ML IJ SOLN
INTRAMUSCULAR | Status: AC
  Filled 2016-02-22: qty 1

## 2016-02-22 MED ORDER — PHENYLEPHRINE 40 MCG/ML (10ML) SYRINGE FOR IV PUSH (FOR BLOOD PRESSURE SUPPORT)
PREFILLED_SYRINGE | INTRAVENOUS | Status: AC
  Filled 2016-02-22: qty 10

## 2016-02-22 MED ORDER — SODIUM CHLORIDE 0.9% FLUSH: 3.0000 mL | INTRAVENOUS | Status: DC | PRN

## 2016-02-22 MED ORDER — VANCOMYCIN HCL 1000 MG IV SOLR
INTRAVENOUS | Status: AC
  Filled 2016-02-22: qty 1000

## 2016-02-22 MED ORDER — MIDAZOLAM HCL 5 MG/5ML IJ SOLN
INTRAMUSCULAR | Status: DC | PRN
  Administered 2016-02-22 (×2): 1 mg via INTRAVENOUS

## 2016-02-22 MED ORDER — MORPHINE SULFATE (PF) 2 MG/ML IV SOLN: 1.0000 mg | INTRAVENOUS | Status: DC | PRN

## 2016-02-22 MED ORDER — VANCOMYCIN HCL 1000 MG IV SOLR
INTRAVENOUS | Status: DC | PRN
  Administered 2016-02-22: 1000 mg via INTRAVENOUS

## 2016-02-22 MED ORDER — CANAGLIFLOZIN 300 MG PO TABS
300.0000 mg | ORAL_TABLET | Freq: Every day | ORAL | Status: DC
  Administered 2016-02-23 – 2016-02-24 (×2): 300 mg via ORAL
  Filled 2016-02-22 (×2): qty 1

## 2016-02-22 MED ORDER — ONDANSETRON HCL 4 MG/2ML IJ SOLN
INTRAMUSCULAR | Status: DC | PRN
  Administered 2016-02-22: 4 mg via INTRAVENOUS

## 2016-02-22 MED ORDER — ASPIRIN EC 81 MG PO TBEC
81.0000 mg | DELAYED_RELEASE_TABLET | Freq: Every evening | ORAL | Status: DC
  Administered 2016-02-22 – 2016-02-23 (×2): 81 mg via ORAL
  Filled 2016-02-22 (×2): qty 1

## 2016-02-22 MED ORDER — THROMBIN 5000 UNITS EX SOLR
OROMUCOSAL | Status: DC | PRN
  Administered 2016-02-22: 12:00:00 via TOPICAL

## 2016-02-22 MED ORDER — VANCOMYCIN HCL 1000 MG IV SOLR
INTRAVENOUS | Status: DC | PRN
  Administered 2016-02-22: 1000 mg

## 2016-02-22 MED ORDER — PHENYLEPHRINE HCL 10 MG/ML IJ SOLN
INTRAMUSCULAR | Status: DC | PRN
  Administered 2016-02-22: 20 ug/min via INTRAVENOUS

## 2016-02-22 MED ORDER — FENTANYL CITRATE (PF) 100 MCG/2ML IJ SOLN
INTRAMUSCULAR | Status: DC | PRN
  Administered 2016-02-22 (×2): 100 ug via INTRAVENOUS
  Administered 2016-02-22 (×2): 50 ug via INTRAVENOUS

## 2016-02-22 MED ORDER — METHOCARBAMOL 500 MG PO TABS
500.0000 mg | ORAL_TABLET | Freq: Four times a day (QID) | ORAL | Status: DC | PRN
  Administered 2016-02-23: 500 mg via ORAL
  Filled 2016-02-22 (×2): qty 1

## 2016-02-22 MED ORDER — BUPIVACAINE HCL (PF) 0.25 % IJ SOLN
INTRAMUSCULAR | Status: DC | PRN
  Administered 2016-02-22: 10 mL

## 2016-02-22 MED ORDER — GABAPENTIN 300 MG PO CAPS
300.0000 mg | ORAL_CAPSULE | Freq: Three times a day (TID) | ORAL | Status: DC
Start: 2016-02-22 — End: 2016-02-24
  Administered 2016-02-22 – 2016-02-24 (×5): 300 mg via ORAL
  Filled 2016-02-22 (×5): qty 1

## 2016-02-22 MED ORDER — PHENOL 1.4 % MT LIQD: 1.0000 | OROMUCOSAL | Status: DC | PRN

## 2016-02-22 MED ORDER — POTASSIUM CHLORIDE IN NACL 20-0.9 MEQ/L-% IV SOLN
INTRAVENOUS | Status: DC
  Administered 2016-02-22: 75 mL/h via INTRAVENOUS
  Administered 2016-02-24: 02:00:00 via INTRAVENOUS
  Filled 2016-02-22 (×3): qty 1000

## 2016-02-22 MED ORDER — 0.9 % SODIUM CHLORIDE (POUR BTL) OPTIME
TOPICAL | Status: DC | PRN
  Administered 2016-02-22: 1000 mL

## 2016-02-22 MED ORDER — PROPOFOL 10 MG/ML IV BOLUS
INTRAVENOUS | Status: DC | PRN
  Administered 2016-02-22: 30 mg via INTRAVENOUS
  Administered 2016-02-22: 120 mg via INTRAVENOUS
  Administered 2016-02-22: 30 mg via INTRAVENOUS

## 2016-02-22 MED ORDER — CELECOXIB 200 MG PO CAPS
200.0000 mg | ORAL_CAPSULE | Freq: Two times a day (BID) | ORAL | Status: DC
  Administered 2016-02-22 – 2016-02-24 (×4): 200 mg via ORAL
  Filled 2016-02-22 (×4): qty 1

## 2016-02-22 MED ORDER — MIDAZOLAM HCL 2 MG/2ML IJ SOLN
INTRAMUSCULAR | Status: AC
  Filled 2016-02-22: qty 2

## 2016-02-22 MED ORDER — SODIUM CHLORIDE 0.9% FLUSH
3.0000 mL | Freq: Two times a day (BID) | INTRAVENOUS | Status: DC
  Administered 2016-02-23 – 2016-02-24 (×2): 3 mL via INTRAVENOUS

## 2016-02-22 MED ORDER — ARTIFICIAL TEARS OP OINT
TOPICAL_OINTMENT | OPHTHALMIC | Status: DC | PRN
  Administered 2016-02-22: 1 via OPHTHALMIC

## 2016-02-22 SURGICAL SUPPLY — 69 items
ADH SKN CLS APL DERMABOND .7 (GAUZE/BANDAGES/DRESSINGS) ×1
APL SKNCLS STERI-STRIP NONHPOA (GAUZE/BANDAGES/DRESSINGS) ×1
BAG DECANTER FOR FLEXI CONT (MISCELLANEOUS) ×3 IMPLANT
BENZOIN TINCTURE PRP APPL 2/3 (GAUZE/BANDAGES/DRESSINGS) ×3 IMPLANT
BIT DRILL PLIF MAS 5.0MM DISP (DRILL) IMPLANT
BLADE CLIPPER SURG (BLADE) IMPLANT
BONE MATRIX OSTEOCEL PRO MED (Bone Implant) ×2 IMPLANT
BUR MATCHSTICK NEURO 3.0 LAGG (BURR) ×3 IMPLANT
CAGE COROENT 9X9X23-4 (Cage) ×4 IMPLANT
CAGE COROENT MP 8X9X23M-8 SPIN (Cage) ×4 IMPLANT
CANISTER SUCT 3000ML PPV (MISCELLANEOUS) ×3 IMPLANT
CAP RELINE MOD TULIP RMM (Cap) ×4 IMPLANT
CLIP NEUROVISION LG (CLIP) ×2 IMPLANT
CLOSURE WOUND 1/2 X4 (GAUZE/BANDAGES/DRESSINGS) ×1
CONT SPEC 4OZ CLIKSEAL STRL BL (MISCELLANEOUS) ×3 IMPLANT
COVER BACK TABLE 24X17X13 BIG (DRAPES) IMPLANT
COVER BACK TABLE 60X90IN (DRAPES) ×3 IMPLANT
DERMABOND ADVANCED (GAUZE/BANDAGES/DRESSINGS) ×2
DERMABOND ADVANCED .7 DNX12 (GAUZE/BANDAGES/DRESSINGS) IMPLANT
DRAPE C-ARM 42X72 X-RAY (DRAPES) ×3 IMPLANT
DRAPE C-ARMOR (DRAPES) ×3 IMPLANT
DRAPE LAPAROTOMY 100X72X124 (DRAPES) ×3 IMPLANT
DRAPE POUCH INSTRU U-SHP 10X18 (DRAPES) ×3 IMPLANT
DRAPE SURG 17X23 STRL (DRAPES) ×3 IMPLANT
DRILL PLIF MAS 5.0MM DISP (DRILL) ×3
DRSG OPSITE POSTOP 4X6 (GAUZE/BANDAGES/DRESSINGS) ×2 IMPLANT
DURAPREP 26ML APPLICATOR (WOUND CARE) ×3 IMPLANT
ELECT REM PT RETURN 9FT ADLT (ELECTROSURGICAL) ×3
ELECTRODE REM PT RTRN 9FT ADLT (ELECTROSURGICAL) ×1 IMPLANT
EVACUATOR 1/8 PVC DRAIN (DRAIN) ×3 IMPLANT
GAUZE SPONGE 4X4 16PLY XRAY LF (GAUZE/BANDAGES/DRESSINGS) IMPLANT
GLOVE BIO SURGEON STRL SZ8 (GLOVE) ×6 IMPLANT
GLOVE ECLIPSE 6.5 STRL STRAW (GLOVE) ×2 IMPLANT
GLOVE ECLIPSE 7.0 STRL STRAW (GLOVE) ×2 IMPLANT
GLOVE INDICATOR 6.5 STRL GRN (GLOVE) ×2 IMPLANT
GLOVE INDICATOR 7.0 STRL GRN (GLOVE) ×4 IMPLANT
GLOVE INDICATOR 7.5 STRL GRN (GLOVE) ×4 IMPLANT
GOWN STRL REUS W/ TWL LRG LVL3 (GOWN DISPOSABLE) IMPLANT
GOWN STRL REUS W/ TWL XL LVL3 (GOWN DISPOSABLE) ×2 IMPLANT
GOWN STRL REUS W/TWL 2XL LVL3 (GOWN DISPOSABLE) IMPLANT
GOWN STRL REUS W/TWL LRG LVL3 (GOWN DISPOSABLE) ×9
GOWN STRL REUS W/TWL XL LVL3 (GOWN DISPOSABLE) ×6
HEMOSTAT POWDER KIT SURGIFOAM (HEMOSTASIS) ×2 IMPLANT
KIT BASIN OR (CUSTOM PROCEDURE TRAY) ×3 IMPLANT
KIT ROOM TURNOVER OR (KITS) ×3 IMPLANT
MILL MEDIUM DISP (BLADE) ×2 IMPLANT
MODULE NVM5 NEXT GEN EMG (NEEDLE) ×2 IMPLANT
NDL HYPO 25X1 1.5 SAFETY (NEEDLE) ×1 IMPLANT
NEEDLE HYPO 25X1 1.5 SAFETY (NEEDLE) ×3 IMPLANT
NS IRRIG 1000ML POUR BTL (IV SOLUTION) ×3 IMPLANT
PACK LAMINECTOMY NEURO (CUSTOM PROCEDURE TRAY) ×3 IMPLANT
PAD ARMBOARD 7.5X6 YLW CONV (MISCELLANEOUS) ×9 IMPLANT
ROD RELINE COCR LORD 5.0X70MM (Rod) ×4 IMPLANT
SCREW LOCK RSS 4.5/5.0MM (Screw) ×12 IMPLANT
SCREW RELINE RMM 5.0X35MM 4S (Screw) ×8 IMPLANT
SCREW SHANK RELINE MOD 5.0X35 (Screw) ×4 IMPLANT
SPONGE LAP 4X18 X RAY DECT (DISPOSABLE) IMPLANT
SPONGE SURGIFOAM ABS GEL 100 (HEMOSTASIS) ×3 IMPLANT
STRIP CLOSURE SKIN 1/2X4 (GAUZE/BANDAGES/DRESSINGS) ×3 IMPLANT
SUT VIC AB 0 CT1 18XCR BRD8 (SUTURE) ×1 IMPLANT
SUT VIC AB 0 CT1 8-18 (SUTURE) ×6
SUT VIC AB 2-0 CP2 18 (SUTURE) ×5 IMPLANT
SUT VIC AB 3-0 SH 8-18 (SUTURE) ×6 IMPLANT
SYR 3ML LL SCALE MARK (SYRINGE) IMPLANT
TOWEL OR 17X24 6PK STRL BLUE (TOWEL DISPOSABLE) ×3 IMPLANT
TOWEL OR 17X26 10 PK STRL BLUE (TOWEL DISPOSABLE) ×3 IMPLANT
TRAP SPECIMEN MUCOUS 40CC (MISCELLANEOUS) ×2 IMPLANT
TRAY FOLEY W/METER SILVER 16FR (SET/KITS/TRAYS/PACK) ×3 IMPLANT
WATER STERILE IRR 1000ML POUR (IV SOLUTION) ×3 IMPLANT

## 2016-02-22 NOTE — Anesthesia Preprocedure Evaluation (Addendum)
Anesthesia Evaluation  Patient identified by MRN, date of birth, ID band Patient awake    Airway Mallampati: II  TM Distance: >3 FB Neck ROM: Full    Dental  (+) Dental Advisory Given, Partial Upper, Teeth Intact   Pulmonary shortness of breath, COPD, former smoker,    breath sounds clear to auscultation       Cardiovascular hypertension, Pt. on medications + CAD   Rhythm:Regular Rate:Normal  02/07/16 Nuclear stress test:  Nuclear stress EF: 76%. No wall motion abnormalities  There was no ST segment deviation noted during stress.  Defect 1: There is a small defect of mild severity present in the apex location. No ischemia identified  This is a low risk study.   Neuro/Psych Depression negative neurological ROS     GI/Hepatic negative GI ROS, Neg liver ROS,   Endo/Other  diabetes, Poorly Controlled, Type 2, Oral Hypoglycemic Agents  Renal/GU negative Renal ROS     Musculoskeletal   Abdominal   Peds  Hematology negative hematology ROS (+)   Anesthesia Other Findings   Reproductive/Obstetrics                           Lab Results  Component Value Date   WBC 6.8 02/19/2016   HGB 14.8 02/19/2016   HCT 44.6 02/19/2016   MCV 88.0 02/19/2016   PLT 181 02/19/2016   Lab Results  Component Value Date   CREATININE 0.80 02/19/2016   BUN 22 (H) 02/19/2016   NA 136 02/19/2016   K 4.4 02/19/2016   CL 106 02/19/2016   CO2 23 02/19/2016    Anesthesia Physical Anesthesia Plan  ASA: II  Anesthesia Plan: General   Post-op Pain Management:    Induction: Intravenous  Airway Management Planned: Oral ETT  Additional Equipment:   Intra-op Plan:   Post-operative Plan: Extubation in OR  Informed Consent: I have reviewed the patients History and Physical, chart, labs and discussed the procedure including the risks, benefits and alternatives for the proposed anesthesia with the patient or  authorized representative who has indicated his/her understanding and acceptance.   Dental advisory given  Plan Discussed with: CRNA  Anesthesia Plan Comments:        Anesthesia Quick Evaluation

## 2016-02-22 NOTE — H&P (Signed)
Subjective: Patient is a 70 y.o. female admitted for spinal stenosis with spondylolisthesis. Onset of symptoms was several weeks ago, gradually worsening since that time.  The pain is rated severe, and is located at the across the lower back and radiates to RLE. The pain is described as aching and occurs all day. The symptoms have been progressive. Symptoms are exacerbated by exercise. MRI or CT showed synovial cysts, spondylolisthesis/ stenosis.   Past Medical History:  Diagnosis Date  . Cancer ([place])    lung ca  . COPD (chronic obstructive pulmonary disease) ([place])   . Depression   . Diabetes mellitus    [name]    dx  2008  . Hypercholesteremia   . Hypertension    on no medications  . Lung cancer ([place])   . Pneumonia    2008  @  [name]  . Shortness of breath dyspnea    [place]    Past Surgical History:  Procedure Laterality Date  . [place] or [facility] in [place]     bilateral cataracts  . LEFT HEART CATHETERIZATION WITH CORONARY ANGIOGRAM N/A 07/12/2014   Procedure: LEFT HEART CATHETERIZATION WITH CORONARY ANGIOGRAM;  Surgeon: [name], MD;  Location: [facility] CATH LAB;  Service: Cardiovascular;  Laterality: N/A;  . THORACOTOMY Right 2010   lower  . TONSILLECTOMY      Prior to Admission medications   Medication Sig Start Date End Date Taking? Authorizing Provider  aspirin EC 81 MG tablet Take 81 mg by mouth every evening.   Yes Historical Provider, MD  canagliflozin (INVOKANA) 100 MG TABS tablet Take 300 mg by mouth daily before breakfast.    Yes Historical Provider, MD  celecoxib (CELEBREX) 100 MG capsule Take 100 mg by mouth 2 (two) times daily. 01/03/16  Yes Historical Provider, MD  gabapentin (NEURONTIN) 300 MG capsule Take 300 mg by mouth 3 (three) times daily.   Yes Historical Provider, MD  Liraglutide (VICTOZA) 18 MG/3ML SOPN Inject 1.8 mg into the skin daily.   Yes Historical Provider, MD  naproxen sodium  (ANAPROX) 220 MG tablet Take 440 mg by mouth 2 (two) times daily as needed (for back pain).   Yes Historical Provider, MD  sertraline (ZOLOFT) 100 MG tablet Take 100 mg by mouth daily.   Yes Historical Provider, MD  sitaGLIPtin-metformin (JANUMET) 50-1000 MG per tablet Take 1 tablet by mouth 2 (two) times daily with a meal.   Yes Historical Provider, MD  TURMERIC PO Take 1 capsule by mouth daily.   Yes Historical Provider, MD  HYDROcodone-acetaminophen (NORCO/VICODIN) 5-325 MG tablet Take 1 tablet by mouth every 6 (six) hours as needed. Patient taking differently: Take 1 tablet by mouth every 6 (six) hours as needed for moderate pain.  01/18/16   Gloriann Loan, PA-C   Allergies  Allergen Reactions  . Amoxicillin Anaphylaxis, Hives and Rash    Has patient had a PCN reaction causing immediate rash, facial/tongue/throat swelling, SOB or lightheadedness with hypotension: YES Positive reaction causing SEVERE RASH INVOLVING MUCUS MEMBRANES/SKIN NECROSIS: YES Reaction that required HOSPITALIZATION: YES Reaction occurring within the last 10 years: NO    Social History  Substance Use Topics  . Smoking status: Former Smoker    Packs/day: 1.00    Years: 30.00    Quit date: 07/14/2006  . Smokeless tobacco: Never Used  . Alcohol use No    Family History  Problem Relation Age of  Onset  . Diabetes Father   . Heart failure Father   . Cancer Sister   . Diabetes Brother   . Heart failure Brother      Review of Systems  Positive ROS: 1+  All other systems have been reviewed and were otherwise negative with the exception of those mentioned in the HPI and as above.  Objective: Vital signs in last 24 hours: Temp:  [97.8 F (36.6 C)] 97.8 F (36.6 C) (08/11 0931) Pulse Rate:  [80] 80 (08/11 0931) Resp:  [18] 18 (08/11 0931) BP: (107)/(52) 107/52 (08/11 0931) SpO2:  [96 %] 96 % (08/11 0931) Weight:  [74 kg (163 lb 1.9 oz)] 74 kg (163 lb 1.9 oz) (08/11 0931)  General Appearance: Alert, cooperative,  no distress, appears stated age Head: Normocephalic, without obvious abnormality, atraumatic Eyes: PERRL, conjunctiva/corneas clear, EOM's intact    Neck: Supple, symmetrical, trachea midline Back: Symmetric, no curvature, ROM normal, no CVA tenderness Lungs:  respirations unlabored Heart: Regular rate and rhythm Abdomen: Soft, non-tender Extremities: Extremities normal, atraumatic, no cyanosis or edema Pulses: 2+ and symmetric all extremities Skin: Skin color, texture, turgor normal, no rashes or lesions  NEUROLOGIC:   Mental status: Alert and oriented x4,  no aphasia, good attention span, fund of knowledge, and memory Motor Exam - grossly normal Sensory Exam - grossly normal Reflexes: 1+ Coordination - grossly normal Gait - not tested Balance - not tested Cranial Nerves: I: smell Not tested  II: visual acuity  OS: nl    OD: nl  II: visual fields Full to confrontation  II: pupils Equal, round, reactive to light  III,VII: ptosis None  III,IV,VI: extraocular muscles  Full ROM  V: mastication Normal  V: facial light touch sensation  Normal  V,VII: corneal reflex  Present  VII: facial muscle function - upper  Normal  VII: facial muscle function - lower Normal  VIII: hearing Not tested  IX: soft palate elevation  Normal  IX,X: gag reflex Present  XI: trapezius strength  5/5  XI: sternocleidomastoid strength 5/5  XI: neck flexion strength  5/5  XII: tongue strength  Normal    Data Review Lab Results  Component Value Date   WBC 6.8 02/19/2016   HGB 14.8 02/19/2016   HCT 44.6 02/19/2016   MCV 88.0 02/19/2016   PLT 181 02/19/2016   Lab Results  Component Value Date   NA 136 02/19/2016   K 4.4 02/19/2016   CL 106 02/19/2016   CO2 23 02/19/2016   BUN 22 (H) 02/19/2016   CREATININE 0.80 02/19/2016   GLUCOSE 179 (H) 02/19/2016   Lab Results  Component Value Date   INR 1.00 02/01/2016    Assessment/Plan: Patient admitted for PLIF l3-4 L4-5. Patient has failed a  reasonable attempt at conservative therapy.  I explained the condition and procedure to the patient and answered any questions.  Patient wishes to proceed with procedure as planned. Understands risks/ benefits and typical outcomes of procedure.   Leemon Ayala S 02/22/2016 11:21 AM

## 2016-02-22 NOTE — Op Note (Signed)
02/22/2016  3:14 PM  PATIENT:  Amy Shepard  70 y.o. female  PRE-OPERATIVE DIAGNOSIS:  1. MR spinal stenosis L3-4 and L4-5, 2. 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 Back and leg pain  POST-OPERATIVE DIAGNOSIS:  same  PROCEDURE:   1. Decompressive lumbar laminectomy L3-4 and L4-5 requiring more work than would be required for a simple exposure of the disk for PLIF in order to adequately decompress the neural elements and address the spinal stenosis, with resection of bilateral synovial cysts L4-5 2. Posterior lumbar interbody fusion L3-4 and L4-5 using PEEK interbody cages packed with morcellized allograft and autograft 3. Posterior fixation L3-L5 inclusive using cortical pedicle screws.    SURGEON:  Sherley Bounds, MD  ASSISTANTS: Dr Kathyrn Sheriff  ANESTHESIA:  General  EBL: 200 ml  Total I/O In: 1700 [I.V.:1700] Out: 450 [Urine:250; Blood:200]  BLOOD ADMINISTERED:none  DRAINS: Hemovac   INDICATION FOR PROCEDURE: This patient presented with severe back and right leg pain. MRI showed a synovial cyst at L4-5 with severe spinal stenosis with spinal stenosis at L3-4 and facet arthropathy. She had plain films of flexion extension views that showed mental instability at L3-4 and L4-5 with grade 1 spondylolisthesis. She tried medical management without relief. I recommended decompression and instrument infusion to address her spinal stenosis and segmental instability. Patient understood the risks, benefits, and alternatives and potential outcomes and wished to proceed.  PROCEDURE DETAILS:  The patient was brought to the operating room. After induction of generalized endotracheal anesthesia the patient was rolled into the prone position on chest rolls and all pressure points were padded. The patient's lumbar region was cleaned and then prepped with DuraPrep and draped in the usual sterile fashion. Anesthesia was injected and then a dorsal midline incision  was made and carried down to the lumbosacral fascia. The fascia was opened and the paraspinous musculature was taken down in a subperiosteal fashion to expose L3-4 and L4-5. A self-retaining retractor was placed. Intraoperative fluoroscopy confirmed my level, and I started with placement of the L3 cortical pedicle screws. The pedicle screw entry zones were identified utilizing surface landmarks and  AP and lateral fluoroscopy. I scored the cortex with the high-speed drill and then used the hand drill and EMG monitoring to drill an upward and outward direction into the pedicle. I then tapped line to line, and the tap was also monitored. I then placed a 5-0 x 35 mm cortical pedicle screw into the pedicles of L3 bilaterally. I then turned my attention to the decompression and the spinous process was removed and complete lumbar laminectomies, hemi- facetectomies, and foraminotomies were performed at L3-4 and L4-5. Bilateral synovial cysts were resected at the L4-5 level. The patient had significant spinal stenosis and this required more work than would be required for a simple exposure of the disc for posterior lumbar interbody fusion. Much more generous decompression was undertaken in order to adequately decompress the neural elements and address the patient's leg pain. The yellow ligament was removed to expose the underlying dura and nerve roots, and generous foraminotomies were performed to adequately decompress the neural elements. Both the exiting and traversing nerve roots were decompressed on both sides until a coronary dilator passed easily along the nerve roots. Once the decompression was complete, I turned my attention to the posterior lower lumbar interbody fusion. The exact same technique was used at both L3-4 and L4-5. The epidural venous vasculature was coagulated and cut sharply. Disc space was incised and the  initial discectomy was performed with pituitary rongeurs. The disc space was distracted with  sequential distractors to a height of 9 mm at 34 and 8 mm at L4-5. We then used a series of scrapers and shavers to prepare the endplates for fusion. The midline was prepared with Epstein curettes. Once the complete discectomy was finished, we packed an appropriate sized peek interbody cage with local autograft and morcellized allograft, gently retracted the nerve root, and tapped the cage into position at L3-4 and L4-5.  The midline between the cages was packed with morselized autograft and allograft. We then turned our attention to the placement of the lower pedicle screws. The pedicle screw entry zones were identified utilizing surface landmarks and fluoroscopy. I drilled into each pedicle utilizing the hand drill and EMG monitoring, and tapped each pedicle with the appropriate tap. We palpated with a ball probe to assure no break in the cortex. We then placed 5-0 x 35 mm cortical pedicle screws into the pedicles bilaterally at L4 and L5.  We then placed lordotic rods into the multiaxial screw heads of the pedicle screws and locked these in position with the locking caps and anti-torque device. We then checked our construct with AP and lateral fluoroscopy. Irrigated with copious amounts of bacitracin-containing saline solution. Placed a medium Hemovac drain through separate stab incision. Inspected the nerve roots once again to assure adequate decompression, lined to the dura with Gelfoam, and closed the muscle and the fascia with 0 Vicryl. Closed the subcutaneous tissues with 2-0 Vicryl and subcuticular tissues with 3-0 Vicryl. The skin was closed with benzoin and Steri-Strips. Dressing was then applied, the patient was awakened from general anesthesia and transported to the recovery room in stable condition. At the end of the procedure all sponge, needle and instrument counts were correct.   PLAN OF CARE: Admit to inpatient   PATIENT DISPOSITION:  PACU - hemodynamically stable.   Delay start of  Pharmacological VTE agent (>24hrs) due to surgical blood loss or risk of bleeding:  yes

## 2016-02-22 NOTE — Progress Notes (Signed)
Pt received stable, with no noted distress. Surgical honeycomb dressing clean dry and intact. Hemovac in place. Foley patent draining clear, yellow urine. Pt assisted with repositioning for comfort. Safety measures in place. Pt oriented to room. Call bell within reach. Will continue to monitor.

## 2016-02-22 NOTE — Progress Notes (Deleted)
Pt to have family bring in

## 2016-02-22 NOTE — Anesthesia Procedure Notes (Signed)
Procedure Name: Intubation Date/Time: 02/22/2016 11:35 AM Performed by: Garrison Columbus T Pre-anesthesia Checklist: Patient identified, Emergency Drugs available, Suction available and Patient being monitored Patient Re-evaluated:Patient Re-evaluated prior to inductionOxygen Delivery Method: Circle System Utilized Preoxygenation: Pre-oxygenation with 100% oxygen Intubation Type: IV induction Ventilation: Mask ventilation without difficulty and Oral airway inserted - appropriate to patient size Laryngoscope Size: Sabra Heck and 2 Grade View: Grade I Tube type: Oral Tube size: 7.5 mm Number of attempts: 1 Airway Equipment and Method: Stylet and Oral airway Placement Confirmation: ETT inserted through vocal cords under direct vision,  positive ETCO2 and breath sounds checked- equal and bilateral Secured at: 21 cm Tube secured with: Tape Dental Injury: Teeth and Oropharynx as per pre-operative assessment

## 2016-02-22 NOTE — Anesthesia Postprocedure Evaluation (Signed)
Anesthesia Post Note  Patient: Amy Shepard  Procedure(s) Performed: Procedure(s) (LRB): Lumbar three-four - Lumbar four-five  MAXIMUM ACCESS SURGERY  POSTERIOR LUMBAR INTERBODY FUSION (N/A)  Patient location during evaluation: PACU Anesthesia Type: General Level of consciousness: awake and alert Pain management: pain level controlled Vital Signs Assessment: post-procedure vital signs reviewed and stable Respiratory status: spontaneous breathing, nonlabored ventilation, respiratory function stable and patient connected to nasal cannula oxygen Cardiovascular status: blood pressure returned to baseline and stable Postop Assessment: no signs of nausea or vomiting Anesthetic complications: no    Last Vitals:  Vitals:   02/22/16 0931 02/22/16 1525  BP: (!) 107/52 (!) 162/80  Pulse: 80 81  Resp: 18 18  Temp: 36.6 C 36.3 C    Last Pain:  Vitals:   02/22/16 1525  TempSrc:   PainSc: 0-No pain    LLE Motor Response: Purposeful movement (02/22/16 1525) LLE Sensation: Full sensation (02/22/16 1525) RLE Motor Response: Purposeful movement (02/22/16 1525) RLE Sensation: Full sensation (02/22/16 1525)      Amron Guerrette A

## 2016-02-22 NOTE — Progress Notes (Signed)
Pharmacy Antibiotic Note Amy Shepard is a 70 y.o. female admitted on 02/22/2016 for decompressive lumbar laminectomy.  Pharmacy has been consulted for vancomyicin dosing for surgical prophylaxis in setting of drain placement.  Plan: 1. Vancomycin 1000 IV every 12 hours.  Goal trough 10-15 mcg/mL.  2. BMP for 8/12 am 3. F/u on drain removal   Height: _0  (154.9 cm) Weight: 163 lb 1.9 oz (74 kg) IBW/kg (Calculated) : 47.8  Temp (24hrs), Avg:97.7 F (36.5 C), Min:97.3 F (36.3 C), Max:98.1 F (36.7 C)   Recent Labs Lab 02/19/16 1101  WBC 6.8  CREATININE 0.80    Estimated Creatinine Clearance: 61.1 mL/min (by C-G formula based on SCr of 0.8 mg/dL).    Allergies  Allergen Reactions  . Amoxicillin Anaphylaxis, Hives and Rash    Has patient had a PCN reaction causing immediate rash, facial/tongue/throat swelling, SOB or lightheadedness with hypotension: YES Positive reaction causing SEVERE RASH INVOLVING MUCUS MEMBRANES/SKIN NECROSIS: YES Reaction that required HOSPITALIZATION: YES Reaction occurring within the last 10 years: NO    Antimicrobials this admission: 8/11 Vancomycin >>   Dose adjustments this admission: n/a  Microbiology results: px  Thank you for allowing pharmacy to be a part of this patient's care.  Vincenza Hews, PharmD, BCPS 02/22/2016, 6:02 PM Pager: [phone]

## 2016-02-22 NOTE — Transfer of Care (Signed)
Immediate Anesthesia Transfer of Care Note  Patient: Amy Shepard  Procedure(s) Performed: Procedure(s): Lumbar three-four - Lumbar four-five  MAXIMUM ACCESS SURGERY  POSTERIOR LUMBAR INTERBODY FUSION (N/A)  Patient Location: PACU  Anesthesia Type:General  Level of Consciousness: awake and oriented  Airway & Oxygen Therapy: Patient Spontanous Breathing and Patient connected to nasal cannula oxygen  Post-op Assessment: Report given to RN and Patient moving all extremities X 4  Post vital signs: Reviewed and stable  Last Vitals:  Vitals:   02/22/16 0931  BP: (!) 107/52  Pulse: 80  Resp: 18  Temp: 36.6 C    Last Pain:  Vitals:   02/22/16 0931  TempSrc: Oral      Patients Stated Pain Goal: 9 (74/25/95 6387)  Complications: No apparent anesthesia complications

## 2016-02-23 LAB — BASIC METABOLIC PANEL
Anion gap: 7 (ref 5–15)
BUN: 11 mg/dL (ref 6–20)
CHLORIDE: 104 mmol/L (ref 101–111)
CO2: 26 mmol/L (ref 22–32)
Calcium: 8.7 mg/dL — ABNORMAL LOW (ref 8.9–10.3)
Creatinine, Ser: 0.67 mg/dL (ref 0.44–1.00)
GFR calc non Af Amer: >60 mL/min (ref >60)
Glucose, Bld: 149 mg/dL — ABNORMAL HIGH (ref 65–99)
POTASSIUM: 3.5 mmol/L (ref 3.5–5.1)
SODIUM: 137 mmol/L (ref 135–145)

## 2016-02-23 MED ORDER — SODIUM CHLORIDE 0.9 % IV SOLN
Freq: Once | INTRAVENOUS | Status: AC
  Administered 2016-02-23: 500 mL/h via INTRAVENOUS

## 2016-02-23 NOTE — Progress Notes (Signed)
Orthopedic Tech Progress Note Patient Details:  Amy Shepard [birth year] [id number]  Patient ID: [name], female   DOB: 08/26/1945, 70 y.o.   MRN: [id number]   [name] 02/23/2016, 3:15 PMCalled [employer] for Lumbar corset.

## 2016-02-23 NOTE — Progress Notes (Signed)
Patient ID: Amy Shepard, female   DOB: [birth year], 70 y.o.   MRN: [id number] vss neuro stable Motor function ok Moderate output from drain Will leave hemovac in place

## 2016-02-23 NOTE — Evaluation (Signed)
Occupational Therapy Evaluation Patient Details Name: Amy Shepard MRN: [id number] DOB: Nov 11, 1945 Today's Date: 02/23/2016    History of Present Illness Patient is a 70 y.o. female admitted for spinal stenosis now s/p L3-4, L4-5 laminectomy and PLIF.  PMH positive for COPD, DM, lung CA and PNA.    Clinical Impression   Pt reports she was independent with ADL PTA. Currently pt overall supervision for safety with functional mobility and ADL with the exception of mod assist for LB ADL. Began back, safety, and ADL education with pt and family. Pt planning to d/c home with 24/7 supervision from family. Pt would benefit from continued skilled OT to address established goals.    Follow Up Recommendations  No OT follow up;Supervision/Assistance - 24 hour (initially)    Equipment Recommendations  3 in 1 bedside comode    Recommendations for Other Services       Precautions / Restrictions Precautions Precautions: Back Precaution Booklet Issued: Yes (comment) Precaution Comments: Pt able to recall 2/3 back precautions. Reviewed all precautions with pt and family. Required Braces or Orthoses: Spinal Brace Spinal Brace: Lumbar corset;Applied in sitting position (not present on OT eval) Restrictions Weight Bearing Restrictions: No      Mobility Bed Mobility Overal bed mobility: Needs Assistance Bed Mobility: Rolling;Sidelying to Sit Rolling: Supervision Sidelying to sit: Min guard     Sit to sidelying: Min assist General bed mobility comments: Min guard for trunk elevation to upright. HOB flat with use of bed rails.  Transfers Overall transfer level: Needs assistance Equipment used: Rolling walker (2 wheeled) Transfers: Sit to/from Stand Sit to Stand: Supervision         General transfer comment: Supervision for safety with sit to stand from EOB x 2. VCs for hand placement and technique.    Balance Overall balance assessment: Needs assistance Sitting-balance  support: Feet supported;No upper extremity supported Sitting balance-Leahy Scale: Good     Standing balance support: No upper extremity supported;During functional activity Standing balance-Leahy Scale: Fair Standing balance comment: able to stand without UE support as donning briefs, but heavy arm support for ambulation                            ADL Overall ADL's : Needs assistance/impaired Eating/Feeding: Modified independent;Sitting   Grooming: Supervision/safety;Standing Grooming Details (indicate cue type and reason): Educated pt on use of 2 cups for oral care. Upper Body Bathing: Set up;Supervision/ safety;Sitting   Lower Body Bathing: Minimal assistance;Sit to/from stand   Upper Body Dressing : Set up;Supervision/safety;Sitting Upper Body Dressing Details (indicate cue type and reason): for hospital gown only Lower Body Dressing: Moderate assistance;Sit to/from stand Lower Body Dressing Details (indicate cue type and reason): Pt unable to cross foot over opposite knee. Discussed use of AE; pt declined and reports family will assist as needed. Toilet Transfer: Supervision/safety;Ambulation;BSC;RW Toilet Transfer Details (indicate cue type and reason): Simulated by sit to stand from EOB with functional mobility in room. Toileting- Clothing Manipulation and Hygiene: Maximal assistance;Sit to/from stand Toileting - Clothing Manipulation Details (indicate cue type and reason): Educated pt on what she can use for toilet aide if needed.     Functional mobility during ADLs: Supervision/safety;Rolling walker General ADL Comments: Educated pt on maintaining back precautions during functional activities, log roll technique for bed mobility, keeping frequently used items at coutner top height. No brace present for OT eval but discussed brace wear schedule. Educated pt on use of AE but pt  politely declined.     Vision Vision Assessment?: No apparent visual deficits    Perception     Praxis      Pertinent Vitals/Pain Pain Assessment: Faces Pain Score: 4  Faces Pain Scale: Hurts even more Pain Location: back Pain Descriptors / Indicators: Grimacing;Guarding;Sore Pain Intervention(s): Limited activity within patient's tolerance;Monitored during session;Patient requesting pain meds-RN notified;Repositioned     Hand Dominance     Extremity/Trunk Assessment Upper Extremity Assessment Upper Extremity Assessment: Overall WFL for tasks assessed   Lower Extremity Assessment Lower Extremity Assessment: Defer to PT evaluation   Cervical / Trunk Assessment Cervical / Trunk Assessment: Other exceptions Cervical / Trunk Exceptions: s/p lumbar sx   Communication Communication Communication: No difficulties   Cognition Arousal/Alertness: Awake/alert Behavior During Therapy: WFL for tasks assessed/performed Overall Cognitive Status: Within Functional Limits for tasks assessed                     General Comments       Exercises       Shoulder Instructions      Home Living Family/patient expects to be discharged to:: Private residence Living Arrangements: Children;Other relatives Available Help at Discharge: Family Type of Home: House Home Access: Level entry     Home Layout: Two level;Bed/bath upstairs Alternate Level Stairs-Number of Steps: flight with landing Alternate Level Stairs-Rails: Right Bathroom Shower/Tub: Tub/shower unit Shower/tub characteristics: Architectural technologist: Standard     Home Equipment: Tub bench;Walker - 2 wheels          Prior Functioning/Environment Level of Independence: Independent        Comments: Used tub bench for bathing PTA.    OT Diagnosis: Generalized weakness;Acute pain   OT Problem List: Decreased strength;Impaired balance (sitting and/or standing);Decreased knowledge of use of DME or AE;Decreased knowledge of precautions;Pain   OT Treatment/Interventions: Self-care/ADL  training;Energy conservation;DME and/or AE instruction;Therapeutic activities;Patient/family education;Balance training    OT Goals(Current goals can be found in the care plan section) Acute Rehab OT Goals Patient Stated Goal: To return home OT Goal Formulation: With patient Time For Goal Achievement: 03/08/16 Potential to Achieve Goals: Good ADL Goals Pt Will Transfer to Toilet: with modified independence;ambulating;bedside commode Pt Will Perform Toileting - Clothing Manipulation and hygiene: with modified independence;sit to/from stand (with or without AE) Pt Will Perform Tub/Shower Transfer: Tub transfer;with supervision;tub bench;ambulating;rolling walker Additional ADL Goal #1: Pt will independently verbally recall 3/3 back precautions and maintain throughout ADL. Additional ADL Goal #2: Pt will don/doff back brace with set up as precursor for ADL and functional mobility.  OT Frequency: Min 2X/week   Barriers to D/C:            Co-evaluation              End of Session Equipment Utilized During Treatment: Surveyor, mining Communication: Mobility status;Patient requests pain meds  Activity Tolerance: Patient tolerated treatment well Patient left: in bed;with call bell/phone within reach;with family/visitor present (sitting EOB)   Time: 5883-2549 OT Time Calculation (min): 21 min Charges:  OT General Charges $OT Visit: 1 Procedure OT Evaluation $OT Eval Moderate Complexity: 1 Procedure G-Codes:     Binnie Kand M.S., OTR/L Pager: [phone]  02/23/2016, 2:34 PM

## 2016-02-23 NOTE — Progress Notes (Signed)
Md informed that pt had bp 87/40 Hr 70 and that not much urine out put patient is asymptomatic. New orders received will continue to monitor. Arthor Captain LPN

## 2016-02-23 NOTE — Evaluation (Signed)
Physical Therapy Evaluation Patient Details Name: Amy Shepard MRN: [id number] DOB: 1946-03-15 Today's Date: 02/23/2016   History of Present Illness  Patient is a 70 y.o. female admitted for spinal stenosis now s/p L3-4, L4-5 laminectomy and PLIF.  PMH positive for COPD, DM, lung CA and PNA.   Clinical Impression  Patient presents with decreased mobility due to deficits listed in PT problem list.  She will benefit from skilled PT in the acute setting to allow return home with family support and follow up HHPT.     Follow Up Recommendations Home health PT    Equipment Recommendations  None recommended by PT    Recommendations for Other Services       Precautions / Restrictions Precautions Precautions: Back Precaution Booklet Issued: No Required Braces or Orthoses: Spinal Brace Spinal Brace: Lumbar corset;Applied in sitting position (not applied this session as not yet available. ) Restrictions Weight Bearing Restrictions: No      Mobility  Bed Mobility Overal bed mobility: Needs Assistance Bed Mobility: Rolling;Sidelying to Sit;Sit to Sidelying Rolling: Supervision Sidelying to sit: Min assist;HOB elevated     Sit to sidelying: Min assist General bed mobility comments: used rail to roll, assist for trunk to upright, assist for legs into bed to side  Transfers Overall transfer level: Needs assistance Equipment used: Rolling walker (2 wheeled) Transfers: Sit to/from Stand Sit to Stand: Min assist;From elevated surface         General transfer comment: up from EOB and later down and up from couch in room  Ambulation/Gait Ambulation/Gait assistance: Min guard Ambulation Distance (Feet): 90 Feet Assistive device: Rolling walker (2 wheeled) Gait Pattern/deviations: Step-through pattern;Decreased stride length;Shuffle     General Gait Details: several stops to rest due to arm fatigue, no brace also so limited distance,    Stairs            Wheelchair  Mobility    Modified Rankin (Stroke Patients Only)       Balance Overall balance assessment: Needs assistance         Standing balance support: No upper extremity supported Standing balance-Leahy Scale: Fair Standing balance comment: able to stand without UE support as donning briefs, but heavy arm support for ambulation                             Pertinent Vitals/Pain Pain Assessment: 0-10 Pain Score: 4  Pain Location: at rest in incision, increased with movement Pain Descriptors / Indicators: Sore;Tightness;Grimacing;Operative site guarding Pain Intervention(s): Monitored during session;Repositioned    Home Living Family/patient expects to be discharged to:: Private residence Living Arrangements: Children Available Help at Discharge: Family Type of Home: House Home Access: Level entry     Home Layout: Two level;Bed/bath upstairs Home Equipment: Tub bench;Walker - 2 wheels      Prior Function Level of Independence: Independent               Hand Dominance        Extremity/Trunk Assessment   Upper Extremity Assessment: Defer to OT evaluation           Lower Extremity Assessment: Generalized weakness         Communication   Communication: No difficulties  Cognition Arousal/Alertness: Awake/alert Behavior During Therapy: WFL for tasks assessed/performed Overall Cognitive Status: Within Functional Limits for tasks assessed  General Comments General comments (skin integrity, edema, etc.): still with drain; RN removed foley prior to mobilizing, pt asked for assist to don briefs due to leaking at times    Exercises        Assessment/Plan    PT Assessment Patient needs continued PT services  PT Diagnosis Acute pain;Difficulty walking   PT Problem List Decreased activity tolerance;Decreased knowledge of use of DME;Decreased knowledge of precautions;Pain;Decreased mobility  PT Treatment Interventions  DME instruction;Gait training;Stair training;Functional mobility training;Therapeutic exercise;Therapeutic activities;Patient/family education   PT Goals (Current goals can be found in the Care Plan section) Acute Rehab PT Goals Patient Stated Goal: To return home PT Goal Formulation: With patient Time For Goal Achievement: 02/26/16 Potential to Achieve Goals: Good    Frequency Min 5X/week   Barriers to discharge        Co-evaluation               End of Session   Activity Tolerance: Patient limited by fatigue Patient left: in bed;with call bell/phone within reach           Time: 1015-1045 PT Time Calculation (min) (ACUTE ONLY): 30 min   Charges:   PT Evaluation $PT Eval Moderate Complexity: 1 Procedure PT Treatments $Gait Training: 8-22 mins   PT G CodesReginia Naas 03/21/2016, 11:40 AM  Magda Kiel, [place] 2016-03-21

## 2016-02-23 NOTE — Progress Notes (Signed)
MD on call was call awaiting call back.

## 2016-02-23 NOTE — Progress Notes (Signed)
Pt bp is still low 88/40 pt is asymptomatic but instructed that she should not attempt to get out of bed quickly but to sit awhile to allow blood flow to her head. Pt foley was left in because she doesn't have her back brace with her and she did not want to use bed pan. Arthor Captain LPN

## 2016-02-24 MED ORDER — OXYCODONE-ACETAMINOPHEN 5-325 MG PO TABS: 1.0000 | ORAL_TABLET | ORAL | 0 refills | Status: DC | PRN

## 2016-02-24 MED ORDER — METHOCARBAMOL 500 MG PO TABS: 500.0000 mg | ORAL_TABLET | Freq: Four times a day (QID) | ORAL | 0 refills | Status: DC | PRN

## 2016-02-24 NOTE — Progress Notes (Signed)
No issues overnight. Pt doing well, reports mild back pain. Ambulating well with PT with rolling walker. Wants to go home.  EXAM:  BP (!) 102/54 (BP Location: Right Arm)   Pulse 88   Temp 99 F (37.2 C) (Oral)   Resp 18   Ht _0  (1.549 m)   Wt 74 kg (163 lb 1.9 oz)   SpO2 97%   BMI 30.82 kg/m   Awake, alert, oriented  Speech fluent, appropriate  CN grossly intact  5/5 BUE/BLE  Wound c/d/i  IMPRESSION:  70 y.o. female POD#2 s/p L3-5 PLIF, doing well  PLAN: - Will d/c today

## 2016-02-24 NOTE — Progress Notes (Signed)
Discharge orders received.  Discharge instructions and follow-up appointments reviewed with the patient.  VSS upon discharge.  IV and hemovac removed and education complete.  Transported out via wheelchair.  Cori Razor, RN

## 2016-02-24 NOTE — Progress Notes (Signed)
Physical Therapy Treatment Patient Details Name: Amy Shepard MRN: [id number] DOB: September 28, 1945 Today's Date: 02/24/2016    History of Present Illness Patient is a 70 y.o. female admitted for spinal stenosis now s/p L3-4, L4-5 laminectomy and PLIF.  PMH positive for COPD, DM, lung CA and PNA.     PT Comments    Pt performed mobility including, bed mobility, gait and transfer training.  Pt required cues for correct application of lumbar corset and safety to maintain precautions during mobility.  Pt will d/c home tomorrow and will require stair training in am.    Follow Up Recommendations  Home health PT     Equipment Recommendations  None recommended by PT    Recommendations for Other Services       Precautions / Restrictions Precautions Precautions: Back Precaution Booklet Issued: Yes (comment) Precaution Comments: Pt able to recall 3/3 back precautions. Reviewed all precautions with pt during mobility.   Required Braces or Orthoses: Spinal Brace Spinal Brace: Lumbar corset;Applied in sitting position (educated on brace placement) Restrictions Weight Bearing Restrictions: No    Mobility  Bed Mobility Overal bed mobility: Needs Assistance Bed Mobility: Rolling;Sidelying to Sit Rolling: Supervision       Sit to sidelying: Supervision General bed mobility comments: Good technique follows precautions well.    Transfers Overall transfer level: Needs assistance Equipment used: Rolling walker (2 wheeled) Transfers: Sit to/from Stand Sit to Stand: Supervision         General transfer comment: heavy reliance on UEs during mobility.    Ambulation/Gait Ambulation/Gait assistance: Supervision Ambulation Distance (Feet): 80 Feet Assistive device: Rolling walker (2 wheeled) Gait Pattern/deviations: Step-through pattern;Shuffle;Antalgic   Gait velocity interpretation: Below normal speed for age/gender General Gait Details: Pt with heavy reliance on RW during gait,  cues for forward gaze and scapular retraction.     Stairs            Wheelchair Mobility    Modified Rankin (Stroke Patients Only)       Balance Overall balance assessment: Needs assistance   Sitting balance-Leahy Scale: Good       Standing balance-Leahy Scale: Fair                      Cognition Arousal/Alertness: Awake/alert Behavior During Therapy: WFL for tasks assessed/performed Overall Cognitive Status: Within Functional Limits for tasks assessed                      Exercises      General Comments        Pertinent Vitals/Pain Pain Assessment: Faces Pain Score: 4  Faces Pain Scale: Hurts even more Pain Location: back Pain Descriptors / Indicators: Grimacing;Guarding Pain Intervention(s): Monitored during session;Repositioned    Home Living                      Prior Function            PT Goals (current goals can now be found in the care plan section) Acute Rehab PT Goals Patient Stated Goal: To return home Potential to Achieve Goals: Good Progress towards PT goals: Progressing toward goals    Frequency  Min 5X/week    PT Plan      Co-evaluation             End of Session Equipment Utilized During Treatment: Gait belt;Back brace Activity Tolerance: Patient limited by fatigue Patient left: in bed;with call bell/phone within reach;with bed alarm  set     Time: 5427-0623 PT Time Calculation (min) (ACUTE ONLY): 24 min  Charges:  $Gait Training: 8-22 mins $Therapeutic Activity: 8-22 mins                    G Codes:      Cristela Blue 07-Mar-2016, 11:42 AM

## 2016-02-24 NOTE — Care Management Note (Signed)
Case Management Note  Patient Details  Name: Amy Shepard MRN: [id number] Date of Birth: 1946-05-09  Subjective/Objective:                  spinal stenosis now s/p L3-4, L4-5 laminectomy and PLIF  Action/Plan: Cm spoke to patient at the bedside who states that she does not feel that she needs [facility] services despite recommendation and refused services. Cm offered to obtain 3N1 for the bedside and patient said that she does not need it as the bathroom is only a Short distance at home and has a RW. Her daughter and 2 grandchildren will be there to assist and has no further discharge planning needs.    Expected Discharge Date:  02/24/16              Expected Discharge Plan:  Home/Self Care  In-House Referral:     Discharge planning Services  CM Consult  Post Acute Care Choice:    Choice offered to:  Patient  DME Arranged:    DME Agency:     HH Arranged:    [place] Agency:     Status of Service:  Completed, signed off  If discussed at [employer] of Stay Meetings, dates discussed:    Additional Comments:  Guido Sander, RN 02/24/2016, 12:14 PM

## 2016-02-24 NOTE — Progress Notes (Signed)
Occupational Therapy Treatment Patient Details Name: Amy Shepard MRN: [id number] DOB: 1946/05/24 Today's Date: 02/24/2016    History of present illness Patient is a 70 y.o. female admitted for spinal stenosis now s/p L3-4, L4-5 laminectomy and PLIF.  PMH positive for COPD, DM, lung CA and PNA.    OT comments  Pt able to meet all OT goals adequately for d/c from acute OT services. Pt able to don/doff back brace with set up. Currently pt is overall supervision for functional mobility and tub transfer using a shower seat. Pt able to recall 2/3 back precautions; reviewed all precautions with pt. D/c plan remains appropriate. No further acute OT needs identified; signing off at this time. Please re-consult if needs change.    Follow Up Recommendations  No OT follow up;Supervision/Assistance - 24 hour (initially)    Equipment Recommendations  3 in 1 bedside comode    Recommendations for Other Services      Precautions / Restrictions Precautions Precautions: Back Precaution Booklet Issued: No Precaution Comments: Pt able to recall 2/3 back precautions (no arching). Reviewed all precauitons with pt. Required Braces or Orthoses: Spinal Brace Spinal Brace: Lumbar corset;Applied in sitting position Restrictions Weight Bearing Restrictions: No       Mobility Bed Mobility Overal bed mobility: Needs Assistance Bed Mobility: Rolling;Sidelying to Sit Rolling: Supervision       Sit to sidelying: Supervision General bed mobility comments: Pt OOB in chair upon arrival.  Transfers Overall transfer level: Needs assistance Equipment used: Rolling walker (2 wheeled) Transfers: Sit to/from Stand Sit to Stand: Supervision         General transfer comment: Supervision for safety. VCs for hand placement with sit to stand.    Balance Overall balance assessment: Needs assistance Sitting-balance support: Feet supported;No upper extremity supported Sitting balance-Leahy Scale: Good      Standing balance support: During functional activity;No upper extremity supported Standing balance-Leahy Scale: Fair                     ADL Overall ADL's : Needs assistance/impaired                 Upper Body Dressing : Set up;Sitting Upper Body Dressing Details (indicate cue type and reason): to don/doff back brace     Toilet Transfer: Supervision/safety;Ambulation;BSC;RW Toilet Transfer Details (indicate cue type and reason): Simulated by sit to stand from chair with functional mobility.     Tub/ Shower Transfer: Supervision/safety;Tub transfer;Ambulation;Shower Technical sales engineer Details (indicate cue type and reason): Pt able to demo safe tub transfer technique with use of shower seat. Functional mobility during ADLs: Supervision/safety;Rolling walker General ADL Comments: Reviewed back precautions in relation to functional activities.       Vision                     Perception     Praxis      Cognition   Behavior During Therapy: WFL for tasks assessed/performed Overall Cognitive Status: Within Functional Limits for tasks assessed       Memory: Decreased recall of precautions               Extremity/Trunk Assessment               Exercises     Shoulder Instructions       General Comments      Pertinent Vitals/ Pain       Pain Assessment: Faces Pain Score: 4  Faces Pain  Scale: Hurts little more Pain Location: back Pain Descriptors / Indicators: Sore Pain Intervention(s): Monitored during session  Home Living                                          Prior Functioning/Environment              Frequency       Progress Toward Goals  OT Goals(current goals can now be found in the care plan section)  Progress towards OT goals: Goals met/education completed, patient discharged from OT  Acute Rehab OT Goals Patient Stated Goal: To return home OT Goal Formulation: All  assessment and education complete, DC therapy  Plan Discharge plan remains appropriate;All goals met and education completed, patient discharged from OT services    Co-evaluation                 End of Session Equipment Utilized During Treatment: Rolling walker;Back brace   Activity Tolerance Patient tolerated treatment well   Patient Left in chair;with call bell/phone within reach   Nurse Communication          Time: 1430-1445 OT Time Calculation (min): 15 min  Charges: OT General Charges $OT Visit: 1 Procedure OT Treatments $Self Care/Home Management : 8-22 mins  Binnie Kand M.S., OTR/L Pager: [phone]  02/24/2016, 2:57 PM

## 2016-02-24 NOTE — Discharge Summary (Signed)
Physician Discharge Summary  Patient ID: Amy Shepard MRN: [id number] DOB/AGE: 01/13/1946 70 y.o.  Admit date: 02/22/2016 Discharge date: 02/24/2016  Admission Diagnoses: Lumbar synovial cysts, instability  Discharge Diagnoses: Same Active Problems:   S/P lumbar spinal fusion   Discharged Condition: Stable  Hospital Course:  Amy Shepard is a 70 y.o. female electively admitted after uncomplicated lumbar fusion/resection of synovial cysts. She was at baseline postop, with progressively improving ambulation with rolling walker. Pain was well controlled.  Treatments: Surgery - L3-5 PLIF  Discharge Exam: Blood pressure (!) 102/54, pulse 88, temperature 99 F (37.2 C), temperature source Oral, resp. rate 18, height _0  (1.549 m), weight 74 kg (163 lb 1.9 oz), SpO2 97 %. Awake, alert, oriented Speech fluent, appropriate CN grossly intact 5/5 BUE/BLE Wound c/d/i  Disposition: 01-Home or Self Care  Discharge Instructions    Ambulatory referral to [facility]    Complete by:  As directed   Please evaluate Amy Shepard for admission to [facility].  Disciplines requested: Physical Therapy  Services to provide: Strengthening Exercises and Evaluate  Physician to follow patient's care (the person listed here will be responsible for signing ongoing orders): Other: Dr. Sherley Bounds, MD  Requested Start of Care Date: Tomorrow  I certify that this patient is under my care and that I, or a Nurse Practitioner or Physician's Assistant working with me, had a face-to-face encounter that meets the physician face-to-face requirements with patient on 02/24/16. The encounter with the patient was in whole, or in part for the following medical condition(s) which is the primary reason for home health care (List medical condition). Lumbar synovial cysts, s/p fusion   Does the patient have Medicare or Medicaid?:  No   The encounter with the patient was in whole, or in part, for  the following medical condition, which is the primary reason for home health care:  Lumbar synovial cysts   Reason for Medically Necessary Home Health Services:  Therapy- Therapeutic Exercises to Increase Strength and Endurance   My clinical findings support the need for the above services:  Pain interferes with ambulation/mobility   I certify that, based on my findings, the following services are medically necessary home health services:  Physical therapy   Further, I certify that my clinical findings support that this patient is homebound due to:  Pain interferes with ambulation/mobility       Medication List    TAKE these medications   aspirin EC 81 MG tablet Take 81 mg by mouth every evening.   celecoxib 100 MG capsule Commonly known as:  CELEBREX Take 100 mg by mouth 2 (two) times daily.   gabapentin 300 MG capsule Commonly known as:  NEURONTIN Take 300 mg by mouth 3 (three) times daily.   HYDROcodone-acetaminophen 5-325 MG tablet Commonly known as:  NORCO/VICODIN Take 1 tablet by mouth every 6 (six) hours as needed. What changed:  reasons to take this   INVOKANA 100 MG Tabs tablet Generic drug:  canagliflozin Take 300 mg by mouth daily before breakfast.   methocarbamol 500 MG tablet Commonly known as:  ROBAXIN Take 1 tablet (500 mg total) by mouth every 6 (six) hours as needed for muscle spasms.   naproxen sodium 220 MG tablet Commonly known as:  ANAPROX Take 440 mg by mouth 2 (two) times daily as needed (for back pain).   oxyCODONE-acetaminophen 5-325 MG tablet Commonly known as:  PERCOCET/ROXICET Take 1 tablet by mouth every 4 (four) hours as needed for moderate pain.  sertraline 100 MG tablet Commonly known as:  ZOLOFT Take 100 mg by mouth daily.   sitaGLIPtin-metformin 50-1000 MG tablet Commonly known as:  JANUMET Take 1 tablet by mouth 2 (two) times daily with a meal.   TURMERIC PO Take 1 capsule by mouth daily.   VICTOZA 18 MG/3ML Sopn Generic drug:   Liraglutide Inject 1.8 mg into the skin daily.      Follow-up Information    [name], MD Follow up today.   Specialty:  Neurosurgery Contact information: [address] [place] [address] [place] [address] [phone]           Signed: Consuella Lose, [name] 02/24/2016, 11:32 AM

## 2016-02-26 ENCOUNTER — Encounter (HOSPITAL_COMMUNITY): Payer: Self-pay | Admitting: Neurological Surgery

## 2016-03-10 ENCOUNTER — Emergency Department (HOSPITAL_COMMUNITY): Payer: BLUE CROSS/BLUE SHIELD

## 2016-03-10 ENCOUNTER — Inpatient Hospital Stay (HOSPITAL_COMMUNITY)
Admission: EM | Admit: 2016-03-10 | Discharge: 2016-03-13 | DRG: 190 | Disposition: A | Discharge status: Home / Self Care | Payer: BLUE CROSS/BLUE SHIELD | Attending: Family Medicine | Admitting: Family Medicine

## 2016-03-10 ENCOUNTER — Encounter (HOSPITAL_COMMUNITY): Payer: Self-pay | Admitting: Radiology

## 2016-03-10 DIAGNOSIS — J9601 Acute respiratory failure with hypoxia: Secondary | ICD-10-CM | POA: Diagnosis present

## 2016-03-10 DIAGNOSIS — Z7982 Long term (current) use of aspirin: Secondary | ICD-10-CM

## 2016-03-10 DIAGNOSIS — R590 Localized enlarged lymph nodes: Secondary | ICD-10-CM | POA: Diagnosis present

## 2016-03-10 DIAGNOSIS — E118 Type 2 diabetes mellitus with unspecified complications: Secondary | ICD-10-CM

## 2016-03-10 DIAGNOSIS — F32A Depression, unspecified: Secondary | ICD-10-CM | POA: Diagnosis present

## 2016-03-10 DIAGNOSIS — R0602 Shortness of breath: Secondary | ICD-10-CM | POA: Diagnosis not present

## 2016-03-10 DIAGNOSIS — E119 Type 2 diabetes mellitus without complications: Secondary | ICD-10-CM | POA: Diagnosis present

## 2016-03-10 DIAGNOSIS — F329 Major depressive disorder, single episode, unspecified: Secondary | ICD-10-CM | POA: Diagnosis present

## 2016-03-10 DIAGNOSIS — I5033 Acute on chronic diastolic (congestive) heart failure: Secondary | ICD-10-CM | POA: Diagnosis present

## 2016-03-10 DIAGNOSIS — J449 Chronic obstructive pulmonary disease, unspecified: Secondary | ICD-10-CM | POA: Insufficient documentation

## 2016-03-10 DIAGNOSIS — Z79899 Other long term (current) drug therapy: Secondary | ICD-10-CM

## 2016-03-10 DIAGNOSIS — Z981 Arthrodesis status: Secondary | ICD-10-CM

## 2016-03-10 DIAGNOSIS — J9811 Atelectasis: Secondary | ICD-10-CM | POA: Diagnosis present

## 2016-03-10 DIAGNOSIS — Z833 Family history of diabetes mellitus: Secondary | ICD-10-CM

## 2016-03-10 DIAGNOSIS — J441 Chronic obstructive pulmonary disease with (acute) exacerbation: Secondary | ICD-10-CM | POA: Diagnosis not present

## 2016-03-10 DIAGNOSIS — C3490 Malignant neoplasm of unspecified part of unspecified bronchus or lung: Secondary | ICD-10-CM | POA: Diagnosis present

## 2016-03-10 DIAGNOSIS — Z881 Allergy status to other antibiotic agents status: Secondary | ICD-10-CM

## 2016-03-10 DIAGNOSIS — C3431 Malignant neoplasm of lower lobe, right bronchus or lung: Secondary | ICD-10-CM | POA: Diagnosis not present

## 2016-03-10 DIAGNOSIS — Z794 Long term (current) use of insulin: Secondary | ICD-10-CM

## 2016-03-10 DIAGNOSIS — Z85118 Personal history of other malignant neoplasm of bronchus and lung: Secondary | ICD-10-CM

## 2016-03-10 DIAGNOSIS — R591 Generalized enlarged lymph nodes: Secondary | ICD-10-CM | POA: Diagnosis present

## 2016-03-10 DIAGNOSIS — E7800 Pure hypercholesterolemia, unspecified: Secondary | ICD-10-CM | POA: Diagnosis present

## 2016-03-10 DIAGNOSIS — Z7984 Long term (current) use of oral hypoglycemic drugs: Secondary | ICD-10-CM

## 2016-03-10 DIAGNOSIS — I110 Hypertensive heart disease with heart failure: Secondary | ICD-10-CM | POA: Diagnosis present

## 2016-03-10 DIAGNOSIS — E669 Obesity, unspecified: Secondary | ICD-10-CM | POA: Diagnosis present

## 2016-03-10 DIAGNOSIS — Z9221 Personal history of antineoplastic chemotherapy: Secondary | ICD-10-CM

## 2016-03-10 DIAGNOSIS — Z8249 Family history of ischemic heart disease and other diseases of the circulatory system: Secondary | ICD-10-CM

## 2016-03-10 DIAGNOSIS — Z87891 Personal history of nicotine dependence: Secondary | ICD-10-CM

## 2016-03-10 LAB — CBC
HEMATOCRIT: 37.7 % (ref 36.0–46.0)
HEMOGLOBIN: 12.2 g/dL (ref 12.0–15.0)
MCH: 28.6 pg (ref 26.0–34.0)
MCHC: 32.4 g/dL (ref 30.0–36.0)
MCV: 88.5 fL (ref 78.0–100.0)
Platelets: 275 10*3/uL (ref 150–400)
RBC: 4.26 MIL/uL (ref 3.87–5.11)
RDW: 14.9 % (ref 11.5–15.5)
WBC: 7 10*3/uL (ref 4.0–10.5)

## 2016-03-10 LAB — GLUCOSE, CAPILLARY: GLUCOSE-CAPILLARY: 428 mg/dL — AB (ref 65–99)

## 2016-03-10 LAB — BASIC METABOLIC PANEL
ANION GAP: 8 (ref 5–15)
BUN: 17 mg/dL (ref 6–20)
CHLORIDE: 101 mmol/L (ref 101–111)
CO2: 28 mmol/L (ref 22–32)
Calcium: 9.3 mg/dL (ref 8.9–10.3)
Creatinine, Ser: 0.73 mg/dL (ref 0.44–1.00)
GFR calc non Af Amer: >60 mL/min (ref >60)
GLUCOSE: 251 mg/dL — AB (ref 65–99)
POTASSIUM: 4.2 mmol/L (ref 3.5–5.1)
Sodium: 137 mmol/L (ref 135–145)

## 2016-03-10 LAB — BRAIN NATRIURETIC PEPTIDE: B Natriuretic Peptide: 167.6 pg/mL — ABNORMAL HIGH (ref 0.0–100.0)

## 2016-03-10 LAB — I-STAT TROPONIN, ED: TROPONIN I, POC: 0 ng/mL (ref 0.00–0.08)

## 2016-03-10 MED ORDER — CELECOXIB 100 MG PO CAPS
100.0000 mg | ORAL_CAPSULE | Freq: Two times a day (BID) | ORAL | Status: DC
  Administered 2016-03-10 – 2016-03-13 (×6): 100 mg via ORAL
  Filled 2016-03-10 (×6): qty 1

## 2016-03-10 MED ORDER — GABAPENTIN 300 MG PO CAPS
300.0000 mg | ORAL_CAPSULE | Freq: Three times a day (TID) | ORAL | Status: DC
  Administered 2016-03-10 – 2016-03-13 (×8): 300 mg via ORAL
  Filled 2016-03-10 (×8): qty 1

## 2016-03-10 MED ORDER — ONDANSETRON HCL 4 MG/2ML IJ SOLN: 4.0000 mg | Freq: Four times a day (QID) | INTRAMUSCULAR | Status: DC | PRN

## 2016-03-10 MED ORDER — LORAZEPAM 2 MG/ML IJ SOLN
0.5000 mg | Freq: Once | INTRAMUSCULAR | Status: DC
  Filled 2016-03-10: qty 1

## 2016-03-10 MED ORDER — NAPROXEN 250 MG PO TABS: 500.0000 mg | ORAL_TABLET | Freq: Two times a day (BID) | ORAL | Status: DC | PRN

## 2016-03-10 MED ORDER — IPRATROPIUM-ALBUTEROL 0.5-2.5 (3) MG/3ML IN SOLN: 3.0000 mL | RESPIRATORY_TRACT | Status: DC | PRN

## 2016-03-10 MED ORDER — SERTRALINE HCL 50 MG PO TABS
100.0000 mg | ORAL_TABLET | Freq: Every day | ORAL | Status: DC
  Administered 2016-03-10 – 2016-03-13 (×4): 100 mg via ORAL
  Filled 2016-03-10 (×4): qty 2

## 2016-03-10 MED ORDER — INSULIN ASPART 100 UNIT/ML ~~LOC~~ SOLN
0.0000 [IU] | Freq: Three times a day (TID) | SUBCUTANEOUS | Status: DC
Start: 2016-03-11 — End: 2016-03-13
  Administered 2016-03-11: 2 [IU] via SUBCUTANEOUS
  Administered 2016-03-11: 5 [IU] via SUBCUTANEOUS
  Administered 2016-03-11: 7 [IU] via SUBCUTANEOUS
  Administered 2016-03-12: 1 [IU] via SUBCUTANEOUS
  Administered 2016-03-12: 7 [IU] via SUBCUTANEOUS
  Administered 2016-03-12: 5 [IU] via SUBCUTANEOUS
  Administered 2016-03-13: 2 [IU] via SUBCUTANEOUS

## 2016-03-10 MED ORDER — METHOCARBAMOL 500 MG PO TABS: 500.0000 mg | ORAL_TABLET | Freq: Four times a day (QID) | ORAL | Status: DC | PRN

## 2016-03-10 MED ORDER — INSULIN ASPART 100 UNIT/ML ~~LOC~~ SOLN
10.0000 [IU] | Freq: Once | SUBCUTANEOUS | Status: AC
  Administered 2016-03-11: 10 [IU] via SUBCUTANEOUS

## 2016-03-10 MED ORDER — ACETAMINOPHEN 650 MG RE SUPP: 650.0000 mg | Freq: Four times a day (QID) | RECTAL | Status: DC | PRN

## 2016-03-10 MED ORDER — IOPAMIDOL (ISOVUE-370) INJECTION 76%
INTRAVENOUS | Status: AC
  Administered 2016-03-10: 100 mL
  Filled 2016-03-10: qty 100

## 2016-03-10 MED ORDER — ALBUTEROL SULFATE (2.5 MG/3ML) 0.083% IN NEBU
5.0000 mg | INHALATION_SOLUTION | Freq: Once | RESPIRATORY_TRACT | Status: AC
  Administered 2016-03-10: 5 mg via RESPIRATORY_TRACT
  Filled 2016-03-10: qty 6

## 2016-03-10 MED ORDER — INSULIN ASPART 100 UNIT/ML ~~LOC~~ SOLN
0.0000 [IU] | Freq: Every day | SUBCUTANEOUS | Status: DC
  Administered 2016-03-11: 4 [IU] via SUBCUTANEOUS
  Administered 2016-03-12: 3 [IU] via SUBCUTANEOUS

## 2016-03-10 MED ORDER — PREDNISONE 20 MG PO TABS
60.0000 mg | ORAL_TABLET | Freq: Once | ORAL | Status: AC
  Administered 2016-03-10: 60 mg via ORAL
  Filled 2016-03-10: qty 3

## 2016-03-10 MED ORDER — ONDANSETRON HCL 4 MG PO TABS: 4.0000 mg | ORAL_TABLET | Freq: Four times a day (QID) | ORAL | Status: DC | PRN

## 2016-03-10 MED ORDER — NAPROXEN SODIUM 275 MG PO TABS: 440.0000 mg | ORAL_TABLET | Freq: Two times a day (BID) | ORAL | Status: DC | PRN

## 2016-03-10 MED ORDER — ENOXAPARIN SODIUM 40 MG/0.4ML ~~LOC~~ SOLN
40.0000 mg | Freq: Every day | SUBCUTANEOUS | Status: DC
  Administered 2016-03-10 – 2016-03-12 (×3): 40 mg via SUBCUTANEOUS
  Filled 2016-03-10 (×3): qty 0.4

## 2016-03-10 MED ORDER — GUAIFENESIN ER 600 MG PO TB12
600.0000 mg | ORAL_TABLET | Freq: Two times a day (BID) | ORAL | Status: DC
  Administered 2016-03-10 – 2016-03-13 (×6): 600 mg via ORAL
  Filled 2016-03-10 (×6): qty 1

## 2016-03-10 MED ORDER — IPRATROPIUM-ALBUTEROL 0.5-2.5 (3) MG/3ML IN SOLN
3.0000 mL | Freq: Once | RESPIRATORY_TRACT | Status: AC
  Administered 2016-03-10: 3 mL via RESPIRATORY_TRACT
  Filled 2016-03-10: qty 3

## 2016-03-10 MED ORDER — ASPIRIN EC 81 MG PO TBEC
81.0000 mg | DELAYED_RELEASE_TABLET | Freq: Every evening | ORAL | Status: DC
  Administered 2016-03-10 – 2016-03-12 (×3): 81 mg via ORAL
  Filled 2016-03-10 (×3): qty 1

## 2016-03-10 MED ORDER — ACETAMINOPHEN 325 MG PO TABS: 650.0000 mg | ORAL_TABLET | Freq: Four times a day (QID) | ORAL | Status: DC | PRN

## 2016-03-10 MED ORDER — HYDROCODONE-ACETAMINOPHEN 5-325 MG PO TABS: 1.0000 | ORAL_TABLET | Freq: Four times a day (QID) | ORAL | Status: DC | PRN

## 2016-03-10 NOTE — ED Notes (Signed)
Patient states "I don't think I want that" when giving her her ativan. Order refused/dc

## 2016-03-10 NOTE — ED Notes (Signed)
Meal given

## 2016-03-10 NOTE — ED Notes (Addendum)
O2 sat dropped to 84% on room air. Placed patient on 2 L nasal cannula. Sats came up to 96%. No respiratory distress noted prior to placing oxygen on patient. States she just had lumbar fusion 2 weeks ago and has been lying flat most of the time since.

## 2016-03-10 NOTE — ED Triage Notes (Signed)
Pt here for COPD exacerbation, here for SOB and low saturations, was seen at doctors office and sats were 70s. Pt 83 on RA, able to bring sats up to 92% with deep breathing. Pt does not use home oxygen.

## 2016-03-10 NOTE — ED Provider Notes (Signed)
[facility] DEPT Provider Note   CSN: [id number] Arrival date & time: 03/10/16  1250     History   Chief Complaint Chief Complaint  Patient presents with  . Shortness of Breath    HPI Amy Shepard is a 70 y.o. female.  Pt has noticed shortness of breath mostly with exertion the last few days.  It gets worse when she walks.  She went to a routine follow up appointment for her recent laminectomy surgery and they noted her oxygen was in the 70s in the office.   The history is provided by the patient.  Shortness of Breath  This is a new problem. The average episode lasts 3 days. The problem occurs continuously.The problem has not changed since onset.Associated symptoms include cough (started today) and wheezing (this am). Pertinent negatives include no fever, no rhinorrhea, no chest pain and no leg swelling. Risk factors: pt did have back surgery two weeks ago. Associated medical issues include COPD (not on home o2) and chronic lung disease. Associated medical issues do not include PE, heart failure or DVT.    Past Medical History:  Diagnosis Date  . Cancer ([place])    lung ca  . COPD (chronic obstructive pulmonary disease) ([place])   . Depression   . Diabetes mellitus    [name]    dx  2008  . Hypercholesteremia   . Hypertension    on no medications  . Lung cancer ([place])   . Pneumonia    2008  @  [name]  . Shortness of breath dyspnea    [facility]    Patient Active Problem List   Diagnosis Date Noted  . S/P lumbar spinal fusion 02/22/2016  . Coronary artery disease involving native coronary artery 07/12/2014  . Pain in the chest   . Malignant neoplasm of lower lobe of right lung ([place]) 05/25/2014  . Lung cancer ([place]) 09/01/2012    Past Surgical History:  Procedure Laterality Date  . [facility] in [place]     bilateral cataracts  . LEFT HEART CATHETERIZATION WITH CORONARY ANGIOGRAM N/A 07/12/2014   Procedure: LEFT HEART CATHETERIZATION WITH CORONARY ANGIOGRAM;  Surgeon: [name], MD;  Location: [facility] CATH LAB;  Service: Cardiovascular;  Laterality: N/A;  . MAXIMUM ACCESS (MAS)POSTERIOR LUMBAR INTERBODY FUSION (PLIF) 2 LEVEL N/A 02/22/2016   Procedure: Lumbar three-four - Lumbar four-five  MAXIMUM ACCESS SURGERY  POSTERIOR LUMBAR INTERBODY FUSION;  Surgeon: [name], MD;  Location: [facility] NEURO ORS;  Service: Neurosurgery;  Laterality: N/A;  . THORACOTOMY Right 2010   lower  . TONSILLECTOMY      OB History    No data available       Home Medications    Prior to Admission medications   Medication Sig Start Date End Date Taking? Authorizing Provider  aspirin EC 81 MG tablet Take 81 mg by mouth every evening.   Yes Historical Provider, MD  canagliflozin (INVOKANA) 100 MG TABS tablet Take 300 mg by mouth daily before breakfast.    Yes Historical Provider, MD  celecoxib (CELEBREX) 100 MG capsule Take 100 mg by mouth 2 (two) times daily. 01/03/16  Yes Historical Provider, MD  gabapentin (NEURONTIN) 300 MG capsule Take 300 mg by mouth 3 (three) times daily.   Yes Historical Provider, MD  HYDROcodone-acetaminophen (NORCO/VICODIN) 5-325 MG tablet Take 1 tablet by mouth every 6 (six) hours as needed. Patient taking differently:  Take 1 tablet by mouth every 6 (six) hours as needed for moderate pain.  01/18/16  Yes Gloriann Loan, PA-C  Liraglutide (VICTOZA) 18 MG/3ML SOPN Inject 1.8 mg into the skin daily.   Yes Historical Provider, MD  methocarbamol (ROBAXIN) 500 MG tablet Take 1 tablet (500 mg total) by mouth every 6 (six) hours as needed for muscle spasms. 02/24/16  Yes Consuella Lose, MD  naproxen sodium (ANAPROX) 220 MG tablet Take 440 mg by mouth 2 (two) times daily as needed (for back pain).   Yes Historical Provider, MD  sertraline (ZOLOFT) 100 MG tablet Take 100 mg by mouth daily.   Yes Historical Provider, MD  sitaGLIPtin-metformin (JANUMET) 50-1000 MG per tablet Take 1 tablet by  mouth 2 (two) times daily with a meal.   Yes Historical Provider, MD  TURMERIC PO Take 1 capsule by mouth daily.   Yes Historical Provider, MD  oxyCODONE-acetaminophen (PERCOCET/ROXICET) 5-325 MG tablet Take 1 tablet by mouth every 4 (four) hours as needed for moderate pain. Patient not taking: Reported on 03/10/2016 02/24/16   Consuella Lose, MD    Family History Family History  Problem Relation Age of Onset  . Diabetes Father   . Heart failure Father   . Cancer Sister   . Diabetes Brother   . Heart failure Brother     Social History Social History  Substance Use Topics  . Smoking status: Former Smoker    Packs/day: 1.00    Years: 30.00    Quit date: 07/14/2006  . Smokeless tobacco: Never Used  . Alcohol use No     Allergies   Amoxicillin   Review of Systems Review of Systems  Constitutional: Negative for fever.  HENT: Negative for rhinorrhea.   Respiratory: Positive for cough (started today), shortness of breath and wheezing (this am).   Cardiovascular: Negative for chest pain and leg swelling.  All other systems reviewed and are negative.    Physical Exam Updated Vital Signs BP 135/73   Pulse 80   Temp 98 F (36.7 C) (Oral)   Resp 17   Ht _0  (1.549 m)   Wt 79.4 kg   SpO2 94%   BMI 33.07 kg/m   Physical Exam  Constitutional: She appears well-developed and well-nourished. No distress.  HENT:  Head: Normocephalic and atraumatic.  Right Ear: External ear normal.  Left Ear: External ear normal.  Eyes: Conjunctivae are normal. Right eye exhibits no discharge. Left eye exhibits no discharge. No scleral icterus.  Neck: Neck supple. No tracheal deviation present.  Cardiovascular: Normal rate, regular rhythm and intact distal pulses.   Pulmonary/Chest: Effort normal. No stridor. No respiratory distress (slight wheeze bilaterally). She has wheezes. She has rales ( few crackles).  Abdominal: Soft. Bowel sounds are normal. She exhibits no distension. There is  no tenderness. There is no rebound and no guarding.  Musculoskeletal: She exhibits no edema or tenderness.  Neurological: She is alert. She has normal strength. No cranial nerve deficit (no facial droop, extraocular movements intact, no slurred speech) or sensory deficit. She exhibits normal muscle tone. She displays no seizure activity. Coordination normal.  Skin: Skin is warm and dry. No rash noted.  Psychiatric: She has a normal mood and affect.  Nursing note and vitals reviewed.    ED Treatments / Results  Labs (all labs ordered are listed, but only abnormal results are displayed) Labs Reviewed  BASIC METABOLIC PANEL - Abnormal; Notable for the following:       Result Value  Glucose, Bld 251 (*)    All other components within normal limits  BRAIN NATRIURETIC PEPTIDE - Abnormal; Notable for the following:    B Natriuretic Peptide 167.6 (*)    All other components within normal limits  CBC  I-STAT TROPOININ, ED    EKG  EKG Interpretation  Date/Time:  Monday March 10 2016 12:55:23 EDT Ventricular Rate:  82 PR Interval:  148 QRS Duration: 74 QT Interval:  396 QTC Calculation: 462 R Axis:   81 Text Interpretation:  Normal sinus rhythm Low voltage QRS Nonspecific ST abnormality Abnormal ECG No significant change since last tracing Confirmed by Meesha Sek  MD-J, Sheldon Amara ([id number]) on 03/10/2016 4:17:18 PM       Radiology Dg Chest 2 View  Result Date: 03/10/2016 CLINICAL DATA:  Cough, shortness of breath. EXAM: CHEST  2 VIEW COMPARISON:  Radiographs February 01, 2016. FINDINGS: Stable cardiomediastinal silhouette. Atherosclerosis of thoracic aorta is noted. No pneumothorax is noted. Minimal bilateral pleural effusions are noted with left greater than right. Mild bibasilar subsegmental atelectasis or edema is noted. Bony thorax is unremarkable. IMPRESSION: Mild bibasilar subsegmental atelectasis or edema with minimal bilateral pleural effusions. Aortic atherosclerosis. Electronically Signed    By: Marijo Conception, M.D.   On: 03/10/2016 13:52   Ct Angio Chest Pe W And/or Wo Contrast  Result Date: 03/10/2016 CLINICAL DATA:  Shortness of breath with exertion. EXAM: CT ANGIOGRAPHY CHEST WITH CONTRAST TECHNIQUE: Multidetector CT imaging of the chest was performed using the standard protocol during bolus administration of intravenous contrast. Multiplanar CT image reconstructions and MIPs were obtained to evaluate the vascular anatomy. CONTRAST:  100 cc Isovue 370. COMPARISON:  11/30/2015. FINDINGS: Cardiovascular: Negative for pulmonary embolus. Atherosclerotic calcification of the arterial vasculature, including three-vessel involvement of the coronary arteries. Pulmonary arteries are borderline enlarged. Heart is enlarged. No pericardial effusion. Mediastinum/Nodes: There are enlarged mediastinal lymph nodes. Index low left paratracheal lymph node measures 12 mm, previously 8 mm. Subcarinal lymph node measures 2.0 cm, previously 11 mm. Bi hilar lymphoid tissue. No axillary adenopathy. Esophagus is grossly unremarkable. Lungs/Pleura: Fairly diffuse ground-glass and septal thickening with a moderate right pleural effusion and small left pleural effusion. Postoperative changes in the right hemi thorax. Mild collapse/ consolidation in the left lower lobe. Minimal consolidation in the right middle lobe and lingula. Trachea is seen in expiration. Upper Abdomen: Visualized portions of the liver, adrenal gland, kidneys, spleen, pancreas and stomach are grossly unremarkable. No upper abdominal adenopathy. Musculoskeletal: No worrisome lytic or sclerotic lesions. Degenerative changes are seen in the spine. Review of the MIP images confirms the above findings. IMPRESSION: 1. Negative for pulmonary embolus. 2. Congestive heart failure. 3. Scattered areas of collapse/consolidation, possibly due to atelectasis. Difficult to exclude pneumonia. 4. Aortic atherosclerosis and three-vessel coronary artery calcification. 5.  Borderline enlarged pulmonary arteries. 6. Mediastinal adenopathy is new from 11/30/2015 and likely due to congestive heart failure. Metastatic disease cannot be definitively excluded in this patient with a history of lung cancer. Consider follow-up CT chest with contrast in 4-6 weeks, as clinically indicated. Electronically Signed   By: Lorin Picket M.D.   On: 03/10/2016 18:03    Procedures Procedures (including critical care time)  Medications Ordered in ED Medications  LORazepam (ATIVAN) injection 0.5 mg (0.5 mg Intravenous Refused 03/10/16 1658)  ipratropium-albuterol (DUONEB) 0.5-2.5 (3) MG/3ML nebulizer solution 3 mL (3 mLs Nebulization Given 03/10/16 1627)  predniSONE (DELTASONE) tablet 60 mg (60 mg Oral Given 03/10/16 1627)  iopamidol (ISOVUE-370) 76 % injection (100 mLs  Contrast Given 03/10/16 1707)     Initial Impression / Assessment and Plan / ED Course  I have reviewed the triage vital signs and the nursing notes.  Pertinent labs & imaging results that were available during my care of the patient were reviewed by me and considered in my medical decision making (see chart for details).  Clinical Course    Patient presents to the emergency room with complaints of shortness of breath. Her oxygen saturation is decreased. She is requiring supplemental oxygen. A CT scan of the chest did not show any evidence of pulmonary embolism. There is suggestion of congestive heart failure however her BNP is only mildly elevated. The patient is having some wheezing on exam. I suspect she may be having a component of COPD exacerbation. I will consult the medical service for admission.  Final Clinical Impressions(s) / ED Diagnoses   Final diagnoses:  COPD exacerbation [facility])    New Prescriptions New Prescriptions   No medications on file     Dorie Rank, MD 03/10/16 2048

## 2016-03-10 NOTE — H&P (Signed)
History and Physical    Amy Shepard PPI:[id number] DOB: 04/13/1946 DOA: 03/10/2016  Referring MD/NP/PA: Dr. Tomi Bamberger PCP: [name], MD  Patient coming from: Dr. [name] office  Chief Complaint: Shortness of breath with exertion  HPI: Amy Shepard is a 70 y.o. 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 Patient had recently had a lumbar fusion done by Dr. [name] on 8/11. She had gone into this office today for follow-up postopt visit when it was seen then her O2 saturations were in the 70s. Otherwise, she had been doing fine since the surgery. Associated symptoms included some intermittent wheezing and cough. The coughstarted today and was productive with whitish sputum. Also over the last 3 months she reported having lost approximately 20 pounds. However, she notes that since the surgery she has gained approximately 12 pounds back as she has not been as active as normal.  Denies having any chest pain, leg swelling, nausea, vomiting, focal weakness, orthopnea, or hemoptysis.Patient notes a previous history of lung cancer which was diagnosed sometime around 2008-2009 which she has been followed by Dr. [name] of oncology. She notes that her last CT scan was done sometime in 11/2015 she was discharged from their care at that time. She denies any history of having heart failure in the past and her diabetes has been relatively well controlled. She did not have any of her diabetic medications today.  ED Course: On admission to the emergency department patient was seen have O2 sats as low as 85% on room air. She was placed to nasal cannula oxygen with improvement of O2 sats rations into the low 90s. Respirations seen as high as 24. All other vital signs within normal limits. Lab work shows relatively unremarkable except for BNP of  167.6 and blood glucose of 251. Chest x-ray showed mild bibasilar subsegmental atelectasis or edema with minimal bilateral pleural effusions. CT angiogram was performed due to patient's recent surgery and revealed  signs of congestive heart failure and new mediastinal lymphadenopathy for which metastatic disease cannot be ruled out and they recommended a repeat CT scan in 4-6 weeks.  Review of Systems: As per HPI otherwise 10 point review of systems negative.   Past Medical History:  Diagnosis Date  . Cancer ([place])    lung ca  . COPD (chronic obstructive pulmonary disease) ([place])   . Depression   . Diabetes mellitus    [name]    dx  2008  . Hypercholesteremia   . Hypertension    on no medications  . Lung cancer ([place])   . Pneumonia    2008  @  [name]  . Shortness of breath dyspnea    doe    Past Surgical History:  Procedure Laterality Date  . [facility] in [place]     bilateral cataracts  . LEFT HEART CATHETERIZATION WITH CORONARY ANGIOGRAM N/A 07/12/2014   Procedure: LEFT HEART CATHETERIZATION WITH CORONARY ANGIOGRAM;  Surgeon: [name], MD;  Location: [facility] CATH LAB;  Service: Cardiovascular;  Laterality: N/A;  . MAXIMUM ACCESS (MAS)POSTERIOR LUMBAR INTERBODY FUSION (PLIF) 2 LEVEL N/A 02/22/2016   Procedure: Lumbar three-four - Lumbar four-five  MAXIMUM ACCESS SURGERY  POSTERIOR LUMBAR INTERBODY FUSION;  Surgeon: [name], MD;  Location:  [facility];  Service: Neurosurgery;  Laterality: N/A;  . THORACOTOMY Right 2010   lower  . TONSILLECTOMY       reports that she quit smoking about 9 years ago. She has a 30.00 pack-year smoking history. She has never used smokeless tobacco. She reports that she does not drink alcohol or use drugs.  Allergies  Allergen Reactions  . Amoxicillin Anaphylaxis, Hives and Rash    Has patient had a PCN reaction causing immediate rash, facial/tongue/throat swelling, SOB or  lightheadedness with hypotension: YES Positive reaction causing SEVERE RASH INVOLVING MUCUS MEMBRANES/SKIN NECROSIS: YES Reaction that required HOSPITALIZATION: YES Reaction occurring within the last 10 years: NO    Family History  Problem Relation Age of Onset  . Diabetes Father   . Heart failure Father   . Cancer Sister   . Diabetes Brother   . Heart failure Brother     Prior to Admission medications   Medication Sig Start Date End Date Taking? Authorizing Provider  aspirin EC 81 MG tablet Take 81 mg by mouth every evening.   Yes Historical Provider, MD  canagliflozin (INVOKANA) 100 MG TABS tablet Take 300 mg by mouth daily before breakfast.    Yes Historical Provider, MD  celecoxib (CELEBREX) 100 MG capsule Take 100 mg by mouth 2 (two) times daily. 01/03/16  Yes Historical Provider, MD  gabapentin (NEURONTIN) 300 MG capsule Take 300 mg by mouth 3 (three) times daily.   Yes Historical Provider, MD  HYDROcodone-acetaminophen (NORCO/VICODIN) 5-325 MG tablet Take 1 tablet by mouth every 6 (six) hours as needed. Patient taking differently: Take 1 tablet by mouth every 6 (six) hours as needed for moderate pain.  01/18/16  Yes Gloriann Loan, PA-C  Liraglutide (VICTOZA) 18 MG/3ML SOPN Inject 1.8 mg into the skin daily.   Yes Historical Provider, MD  methocarbamol (ROBAXIN) 500 MG tablet Take 1 tablet (500 mg total) by mouth every 6 (six) hours as needed for muscle spasms. 02/24/16  Yes Consuella Lose, MD  naproxen sodium (ANAPROX) 220 MG tablet Take 440 mg by mouth 2 (two) times daily as needed (for back pain).   Yes Historical Provider, MD  sertraline (ZOLOFT) 100 MG tablet Take 100 mg by mouth daily.   Yes Historical Provider, MD  sitaGLIPtin-metformin (JANUMET) 50-1000 MG per tablet Take 1 tablet by mouth 2 (two) times daily with a meal.   Yes Historical Provider, MD  TURMERIC PO Take 1 capsule by mouth daily.   Yes Historical Provider, MD  oxyCODONE-acetaminophen (PERCOCET/ROXICET) 5-325 MG  tablet Take 1 tablet by mouth every 4 (four) hours as needed for moderate pain. Patient not taking: Reported on 03/10/2016 02/24/16   Consuella Lose, MD    Physical Exam: Constitutional: NAD, calm, comfortable Vitals:   03/10/16 1822 03/10/16 1900 03/10/16 1930 03/10/16 2015  BP: 131/65 124/63 135/73 (!) 130/53  Pulse: 81 80 80 91  Resp: _0 Temp:      TempSrc:      SpO2: 93% 93% 94% 95%  Weight:      Height:       Eyes: PERRL, lids and conjunctivae normal ENMT: Mucous membranes are moist. Posterior pharynx clear of any exudate or lesions.Normal dentition.  Neck: normal, supple, no masses, no thyromegaly Respiratory: Decreased overall air movement, Intermittent wheezing appreciated, no crackles. Normal respiratory effort. No accessory muscle use.  Cardiovascular: Regular rate and rhythm, no murmurs / rubs / gallops. No extremity edema. 2+ pedal pulses. No carotid bruits.  Abdomen:  no tenderness, no masses palpated. No hepatosplenomegaly. Bowel sounds positive.  Musculoskeletal: no clubbing / cyanosis. No joint deformity upper and lower extremities. Good ROM, no contractures. Normal muscle tone.  Skin: Lumbar fusion wound healing well no signs of erythema or swelling noted. Neurologic: CN 2-12 grossly intact. Sensation intact, DTR normal. Strength 5/5 in all 4.  Psychiatric: Normal judgment and insight. Alert and oriented x 3. Normal mood.     Labs on Admission: I have personally reviewed following labs and imaging studies  CBC:  Recent Labs Lab 03/10/16 1306  WBC 7.0  HGB 12.2  HCT 37.7  MCV 88.5  PLT 983   Basic Metabolic Panel:  Recent Labs Lab 03/10/16 1306  NA 137  K 4.2  CL 101  CO2 28  GLUCOSE 251*  BUN 17  CREATININE 0.73  CALCIUM 9.3   GFR: Estimated Creatinine Clearance: 63.3 mL/min (by C-G formula based on SCr of 0.8 mg/dL). Liver Function Tests: No results for input(s): AST, ALT, ALKPHOS, BILITOT, PROT, ALBUMIN in the last 168  hours. No results for input(s): LIPASE, AMYLASE in the last 168 hours. No results for input(s): AMMONIA in the last 168 hours. Coagulation Profile: No results for input(s): INR, PROTIME in the last 168 hours. Cardiac Enzymes: No results for input(s): CKTOTAL, CKMB, CKMBINDEX, TROPONINI in the last 168 hours. BNP (last 3 results) No results for input(s): PROBNP in the last 8760 hours. HbA1C: No results for input(s): HGBA1C in the last 72 hours. CBG: No results for input(s): GLUCAP in the last 168 hours. Lipid Profile: No results for input(s): CHOL, HDL, LDLCALC, TRIG, CHOLHDL, LDLDIRECT in the last 72 hours. Thyroid Function Tests: No results for input(s): TSH, T4TOTAL, FREET4, T3FREE, THYROIDAB in the last 72 hours. Anemia Panel: No results for input(s): VITAMINB12, FOLATE, FERRITIN, TIBC, IRON, RETICCTPCT in the last 72 hours. Urine analysis:    Component Value Date/Time   COLORURINE RED (A) 02/10/2013 1041   APPEARANCEUR CLOUDY (A) 02/10/2013 1041   LABSPEC 1.025 02/10/2013 1041   PHURINE 5.0 02/10/2013 1041   GLUCOSEU NEGATIVE 02/10/2013 1041   HGBUR LARGE (A) 02/10/2013 1041   BILIRUBINUR SMALL (A) 02/10/2013 1041   KETONESUR NEGATIVE 02/10/2013 1041   PROTEINUR 30 (A) 02/10/2013 1041   UROBILINOGEN 0.2 02/10/2013 1041   NITRITE NEGATIVE 02/10/2013 1041   LEUKOCYTESUR MODERATE (A) 02/10/2013 1041   Sepsis Labs: No results found for this or any previous visit (from the past 240 hour(s)).   Radiological Exams on Admission: Dg Chest 2 View  Result Date: 03/10/2016 CLINICAL DATA:  Cough, shortness of breath. EXAM: CHEST  2 VIEW COMPARISON:  Radiographs February 01, 2016. FINDINGS: Stable cardiomediastinal silhouette. Atherosclerosis of thoracic aorta is noted. No pneumothorax is noted. Minimal bilateral pleural effusions are noted with left greater than right. Mild bibasilar subsegmental atelectasis or edema is noted. Bony thorax is unremarkable. IMPRESSION: Mild bibasilar  subsegmental atelectasis or edema with minimal bilateral pleural effusions. Aortic atherosclerosis. Electronically Signed   By: Marijo Conception, M.D.   On: 03/10/2016 13:52   Ct Angio Chest Pe W And/or Wo Contrast  Result Date: 03/10/2016 CLINICAL DATA:  Shortness of breath with exertion. EXAM: CT ANGIOGRAPHY CHEST WITH CONTRAST TECHNIQUE: Multidetector CT imaging of the chest was performed using the standard protocol during bolus administration of intravenous contrast. Multiplanar CT image reconstructions and MIPs were obtained to evaluate the vascular anatomy. CONTRAST:  100 cc Isovue 370. COMPARISON:  11/30/2015. FINDINGS: Cardiovascular: Negative for pulmonary embolus. Atherosclerotic calcification of the arterial  vasculature, including three-vessel involvement of the coronary arteries. Pulmonary arteries are borderline enlarged. Heart is enlarged. No pericardial effusion. Mediastinum/Nodes: There are enlarged mediastinal lymph nodes. Index low left paratracheal lymph node measures 12 mm, previously 8 mm. Subcarinal lymph node measures 2.0 cm, previously 11 mm. Bi hilar lymphoid tissue. No axillary adenopathy. Esophagus is grossly unremarkable. Lungs/Pleura: Fairly diffuse ground-glass and septal thickening with a moderate right pleural effusion and small left pleural effusion. Postoperative changes in the right hemi thorax. Mild collapse/ consolidation in the left lower lobe. Minimal consolidation in the right middle lobe and lingula. Trachea is seen in expiration. Upper Abdomen: Visualized portions of the liver, adrenal gland, kidneys, spleen, pancreas and stomach are grossly unremarkable. No upper abdominal adenopathy. Musculoskeletal: No worrisome lytic or sclerotic lesions. Degenerative changes are seen in the spine. Review of the MIP images confirms the above findings. IMPRESSION: 1. Negative for pulmonary embolus. 2. Congestive heart failure. 3. Scattered areas of collapse/consolidation, possibly due  to atelectasis. Difficult to exclude pneumonia. 4. Aortic atherosclerosis and three-vessel coronary artery calcification. 5. Borderline enlarged pulmonary arteries. 6. Mediastinal adenopathy is new from 11/30/2015 and likely due to congestive heart failure. Metastatic disease cannot be definitively excluded in this patient with a history of lung cancer. Consider follow-up CT chest with contrast in 4-6 weeks, as clinically indicated. Electronically Signed   By: Lorin Picket M.D.   On: 03/10/2016 18:03    EKG: Independently reviewed. Normal sinus rhythm similar to previous tracing  Assessment/Plan Suspect COPD exacerbation: Acute. Patient presents with a three-day history of shortness of breath with exertion. CT angiogram of the chest shows signs of congestive heart failure and negative for pulmonary emboli. BNP was only noted to be 167. Patient just had a nuclear stress test that showed EF of 76% on 02/07/2016 with no wall motion abnormalities. On differential includes possible CHF - Admit to MedSurg bed - Continuous pulse oximetry with nasal cannula oxygen to keep O2 sats greater than 92% - Strict ins and outs - Prednisone 60 mg po daily - DuoNeb's  q4hrs prn SOB - Mucinex - May consider need of lasix if symptoms persist. Initially held due unclear picture and recent IV contrast dye given   Diabetes mellitus type 2: Blood glucose on admission 251. - Hold Victoza, Janumet, and Invokana - Hypoglycemic protocol - Started sensitive SSI subcutaneously q AC and Levemir 5 units subcutanseously bid, will need to reassess regimen as needed  in a.m.   History of lung cancer with new lymphadenopathy: Initially diagnosed back in 2009 with stage II B mixed NSCLC. S/p resection and subsequent chemotherapy completed in 2010. Patient was last seen  By Dr. [name] and discharged from his care in 11/2015 after repeat CT scans of chest and abdomen and pelvis were seen to be stable.  Repeat CT scan today however  showed new mediastinal lymphadenopathy. -  Recommended repeat CT scan in 4-6 weeks.  Recent lumbar fusion: Patient is s/p lumbar fusion performed by Dr. [name] on 8/11.  - Continue naproxen/ hydrocodone as needed  Depression  - continue Zoloft  DVT prophylaxis: Lovenox  Code Status: Full Family Communication: No family present at bedside.   Disposition Plan:  Possible discharge home in 2-3 days  Consults called: None Admission status: Observation  Norval Morton MD [facility] Pager [phone]  If 7PM-7AM, please contact night-coverage [email or web address] Password [facility]  03/10/2016, 8:56 PM

## 2016-03-11 ENCOUNTER — Encounter (HOSPITAL_COMMUNITY): Payer: Self-pay | Admitting: *Deleted

## 2016-03-11 DIAGNOSIS — J441 Chronic obstructive pulmonary disease with (acute) exacerbation: Secondary | ICD-10-CM | POA: Diagnosis present

## 2016-03-11 DIAGNOSIS — F329 Major depressive disorder, single episode, unspecified: Secondary | ICD-10-CM | POA: Diagnosis present

## 2016-03-11 DIAGNOSIS — F32A Depression, unspecified: Secondary | ICD-10-CM | POA: Diagnosis present

## 2016-03-11 DIAGNOSIS — Z794 Long term (current) use of insulin: Secondary | ICD-10-CM

## 2016-03-11 DIAGNOSIS — Z833 Family history of diabetes mellitus: Secondary | ICD-10-CM | POA: Diagnosis not present

## 2016-03-11 DIAGNOSIS — J9811 Atelectasis: Secondary | ICD-10-CM | POA: Diagnosis present

## 2016-03-11 DIAGNOSIS — Z8249 Family history of ischemic heart disease and other diseases of the circulatory system: Secondary | ICD-10-CM | POA: Diagnosis not present

## 2016-03-11 DIAGNOSIS — E7800 Pure hypercholesterolemia, unspecified: Secondary | ICD-10-CM | POA: Diagnosis present

## 2016-03-11 DIAGNOSIS — Z981 Arthrodesis status: Secondary | ICD-10-CM | POA: Diagnosis not present

## 2016-03-11 DIAGNOSIS — Z79899 Other long term (current) drug therapy: Secondary | ICD-10-CM | POA: Diagnosis not present

## 2016-03-11 DIAGNOSIS — R590 Localized enlarged lymph nodes: Secondary | ICD-10-CM | POA: Diagnosis present

## 2016-03-11 DIAGNOSIS — Z87891 Personal history of nicotine dependence: Secondary | ICD-10-CM | POA: Diagnosis not present

## 2016-03-11 DIAGNOSIS — E669 Obesity, unspecified: Secondary | ICD-10-CM | POA: Diagnosis present

## 2016-03-11 DIAGNOSIS — E118 Type 2 diabetes mellitus with unspecified complications: Secondary | ICD-10-CM

## 2016-03-11 DIAGNOSIS — Z9221 Personal history of antineoplastic chemotherapy: Secondary | ICD-10-CM | POA: Diagnosis not present

## 2016-03-11 DIAGNOSIS — Z881 Allergy status to other antibiotic agents status: Secondary | ICD-10-CM | POA: Diagnosis not present

## 2016-03-11 DIAGNOSIS — Z7984 Long term (current) use of oral hypoglycemic drugs: Secondary | ICD-10-CM | POA: Diagnosis not present

## 2016-03-11 DIAGNOSIS — C3431 Malignant neoplasm of lower lobe, right bronchus or lung: Secondary | ICD-10-CM | POA: Diagnosis not present

## 2016-03-11 DIAGNOSIS — I5033 Acute on chronic diastolic (congestive) heart failure: Secondary | ICD-10-CM | POA: Diagnosis present

## 2016-03-11 DIAGNOSIS — Z85118 Personal history of other malignant neoplasm of bronchus and lung: Secondary | ICD-10-CM | POA: Diagnosis not present

## 2016-03-11 DIAGNOSIS — R55 Syncope and collapse: Secondary | ICD-10-CM | POA: Diagnosis not present

## 2016-03-11 DIAGNOSIS — Z7982 Long term (current) use of aspirin: Secondary | ICD-10-CM | POA: Diagnosis not present

## 2016-03-11 DIAGNOSIS — R0602 Shortness of breath: Secondary | ICD-10-CM | POA: Diagnosis present

## 2016-03-11 DIAGNOSIS — R591 Generalized enlarged lymph nodes: Secondary | ICD-10-CM | POA: Diagnosis present

## 2016-03-11 DIAGNOSIS — J9601 Acute respiratory failure with hypoxia: Secondary | ICD-10-CM | POA: Diagnosis present

## 2016-03-11 DIAGNOSIS — I110 Hypertensive heart disease with heart failure: Secondary | ICD-10-CM | POA: Diagnosis present

## 2016-03-11 DIAGNOSIS — E119 Type 2 diabetes mellitus without complications: Secondary | ICD-10-CM | POA: Diagnosis present

## 2016-03-11 LAB — GLUCOSE, CAPILLARY
GLUCOSE-CAPILLARY: 177 mg/dL — AB (ref 65–99)
GLUCOSE-CAPILLARY: 282 mg/dL — AB (ref 65–99)
GLUCOSE-CAPILLARY: 339 mg/dL — AB (ref 65–99)
Glucose-Capillary: 305 mg/dL — ABNORMAL HIGH (ref 65–99)

## 2016-03-11 LAB — CBC
HCT: 38.5 % (ref 36.0–46.0)
Hemoglobin: 11.9 g/dL — ABNORMAL LOW (ref 12.0–15.0)
MCH: 27.7 pg (ref 26.0–34.0)
MCHC: 30.9 g/dL (ref 30.0–36.0)
MCV: 89.7 fL (ref 78.0–100.0)
Platelets: 263 10*3/uL (ref 150–400)
RBC: 4.29 MIL/uL (ref 3.87–5.11)
RDW: 14.9 % (ref 11.5–15.5)
WBC: 8.3 10*3/uL (ref 4.0–10.5)

## 2016-03-11 LAB — BASIC METABOLIC PANEL
Anion gap: 11 (ref 5–15)
BUN: 15 mg/dL (ref 6–20)
CALCIUM: 9.5 mg/dL (ref 8.9–10.3)
CO2: 26 mmol/L (ref 22–32)
CREATININE: 0.61 mg/dL (ref 0.44–1.00)
Chloride: 100 mmol/L — ABNORMAL LOW (ref 101–111)
GFR calc Af Amer: >60 mL/min (ref >60)
GLUCOSE: 225 mg/dL — AB (ref 65–99)
Potassium: 4.2 mmol/L (ref 3.5–5.1)
Sodium: 137 mmol/L (ref 135–145)

## 2016-03-11 MED ORDER — INSULIN DETEMIR 100 UNIT/ML ~~LOC~~ SOLN
5.0000 [IU] | Freq: Two times a day (BID) | SUBCUTANEOUS | Status: DC
  Administered 2016-03-11 – 2016-03-12 (×3): 5 [IU] via SUBCUTANEOUS
  Filled 2016-03-11 (×4): qty 0.05

## 2016-03-11 MED ORDER — FUROSEMIDE 10 MG/ML IJ SOLN
20.0000 mg | Freq: Two times a day (BID) | INTRAMUSCULAR | Status: DC
  Administered 2016-03-11 – 2016-03-13 (×5): 20 mg via INTRAVENOUS
  Filled 2016-03-11 (×5): qty 2

## 2016-03-11 MED ORDER — INSULIN DETEMIR 100 UNIT/ML ~~LOC~~ SOLN
5.0000 [IU] | Freq: Every day | SUBCUTANEOUS | Status: DC
  Filled 2016-03-11 (×3): qty 0.05

## 2016-03-11 MED ORDER — BUDESONIDE 0.25 MG/2ML IN SUSP
0.2500 mg | Freq: Two times a day (BID) | RESPIRATORY_TRACT | Status: DC
  Administered 2016-03-11 – 2016-03-13 (×5): 0.25 mg via RESPIRATORY_TRACT
  Filled 2016-03-11 (×5): qty 2

## 2016-03-11 MED ORDER — PREDNISONE 20 MG PO TABS
40.0000 mg | ORAL_TABLET | Freq: Every day | ORAL | Status: DC
  Administered 2016-03-11 – 2016-03-12 (×2): 40 mg via ORAL
  Filled 2016-03-11 (×2): qty 2

## 2016-03-11 MED ORDER — IPRATROPIUM-ALBUTEROL 0.5-2.5 (3) MG/3ML IN SOLN
3.0000 mL | Freq: Four times a day (QID) | RESPIRATORY_TRACT | Status: DC
  Administered 2016-03-11 (×2): 3 mL via RESPIRATORY_TRACT
  Filled 2016-03-11 (×2): qty 3

## 2016-03-11 MED ORDER — DOXYCYCLINE HYCLATE 100 MG PO TABS
100.0000 mg | ORAL_TABLET | Freq: Two times a day (BID) | ORAL | Status: DC
  Administered 2016-03-11 – 2016-03-13 (×5): 100 mg via ORAL
  Filled 2016-03-11 (×5): qty 1

## 2016-03-11 NOTE — Progress Notes (Signed)
[facility] PROGRESS NOTE  Amy Shepard JGO:[id number] DOB: 11/01/1945 DOA: 03/10/2016 PCP: [name], MD  Interim summary and HPI  70 y.o. 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 Patient had recently had a lumbar fusion done by Dr. [name] on 8/11. She had gone into this office today for follow-up postopt visit when it was seen then her O2 saturations were in the 70s. Otherwise, she had been doing fine since the surgery. Associated symptoms included some intermittent wheezing and cough. The coughstarted today and was productive with whitish sputum. Also over the last 3 months she reported having lost approximately 20 pounds. However, she notes that since the surgery she has gained approximately 12 pounds back as she has not been as active as normal.  Denies having any chest pain, leg swelling, nausea, vomiting, focal weakness, orthopnea, or hemoptysis.Patient notes a previous history of lung cancer which was diagnosed sometime around 2008-2009 which she has been followed by Dr. [name] of oncology.  On admission to the emergency department patient was seen have O2 sats as low as 85% on room air. She was placed to nasal cannula oxygen with improvement of O2 sats rations into the low 90s. Respirations seen as high as 24. All other vital signs within normal limits. Lab work shows relatively unremarkable except for BNP of 167.6. CXR demonstrating increase vascular congestion and atelectasis; also with bronchitic changes.  Assessment/Plan: Acute resp failure with hypoxia: improving. Multifactorial; COPD and CHF exacerbation (last one appears to be due to acute on chronic diastolic heart failure) -continue IV lasix -daily weights, low sodium diet and strict intake and output -will check 2-D echo -will also continue  treatment with steroids, pulmicort, doxycycline and nebulizer treatment. -continue mucinex and PRN oxygen   Diabetes mellitus type 2: Blood glucose on admission 251. -continue holding oral hypoglycemic regimen (Victoza, Janumet, and Invokana) -will use SSI and levemir  History of lung cancer with new lymphadenopathy: Initially diagnosed back in 2009 with stage II B mixed NSCLC. S/p resection and subsequent chemotherapy completed in 2010. Patient was last seen  By Dr. [name] and discharged from his care in 11/2015 after repeat CT scans of chest and abdomen and pelvis were seen to be stable.   - Radiology Recommended repeat CT scan in 4-6 weeks.  Recent lumbar fusion: Patient is s/p lumbar fusion performed by Dr. [name] on 8/11.  - Continue PRN analgesics  Depression  - continue Zoloft  Code Status: Full Family Communication: no family at bedside  Disposition Plan: home when medically stable; continue weaning O2 supplementation, continue IV lasix, tx for COPD and follow echo results.   Consultants:  None   Procedures:  See below for x-ray reports  2-D echo: pending   Antibiotics:  Doxycycline (8/29)  HPI/Subjective: Patient is afebrile and denying CP. She reports being weaned with minimal exertion and having mild SOB when laying flat.  Objective: Vitals:   03/10/16 2307 03/11/16 0426  BP: 137/71 (!) 124/58  Pulse: 91 80  Resp: 18 18  Temp: 97.9 F (36.6 C) 98.4 F (36.9 C)    Intake/Output Summary (Last 24 hours) at 03/11/16 1124 Last data filed at 03/11/16 0956  Gross per 24 hour  Intake              700 ml  Output  800 ml  Net             -100 ml   Filed Weights   03/10/16 1259 03/11/16 0000 03/11/16 0958  Weight: 79.4 kg (175 lb) 79.4 kg (175 lb 0.7 oz) 78 kg (171 lb 15.3 oz)    Exam:   General:  Afebrile, reports SOB and feeling weaned with minimal exertion. Denies CP and nausea/vomiting.  Cardiovascular: S1 and S2, no rubs, no  gallops  Respiratory: with slight exp wheezing, decrease BS at bases and positive rhonchi  Abdomen: soft, NT, ND, positive BS  Musculoskeletal: trace edema bilaterally, no cyanosis   Data Reviewed: Basic Metabolic Panel:  Recent Labs Lab 03/10/16 1306 03/11/16 0611  NA 137 137  K 4.2 4.2  CL 101 100*  CO2 28 26  GLUCOSE 251* 225*  BUN 17 15  CREATININE 0.73 0.61  CALCIUM 9.3 9.5   CBC:  Recent Labs Lab 03/10/16 1306 03/11/16 0611  WBC 7.0 8.3  HGB 12.2 11.9*  HCT 37.7 38.5  MCV 88.5 89.7  PLT 275 263   BNP (last 3 results)  Recent Labs  03/10/16 1832  BNP 167.6*   CBG:  Recent Labs Lab 03/10/16 2333 03/11/16 0753  GLUCAP 428* 177*   Studies: Dg Chest 2 View  Result Date: 03/10/2016 CLINICAL DATA:  Cough, shortness of breath. EXAM: CHEST  2 VIEW COMPARISON:  Radiographs February 01, 2016. FINDINGS: Stable cardiomediastinal silhouette. Atherosclerosis of thoracic aorta is noted. No pneumothorax is noted. Minimal bilateral pleural effusions are noted with left greater than right. Mild bibasilar subsegmental atelectasis or edema is noted. Bony thorax is unremarkable. IMPRESSION: Mild bibasilar subsegmental atelectasis or edema with minimal bilateral pleural effusions. Aortic atherosclerosis. Electronically Signed   By: Marijo Conception, M.D.   On: 03/10/2016 13:52   Ct Angio Chest Pe W And/or Wo Contrast  Result Date: 03/10/2016 CLINICAL DATA:  Shortness of breath with exertion. EXAM: CT ANGIOGRAPHY CHEST WITH CONTRAST TECHNIQUE: Multidetector CT imaging of the chest was performed using the standard protocol during bolus administration of intravenous contrast. Multiplanar CT image reconstructions and MIPs were obtained to evaluate the vascular anatomy. CONTRAST:  100 cc Isovue 370. COMPARISON:  11/30/2015. FINDINGS: Cardiovascular: Negative for pulmonary embolus. Atherosclerotic calcification of the arterial vasculature, including three-vessel involvement of the  coronary arteries. Pulmonary arteries are borderline enlarged. Heart is enlarged. No pericardial effusion. Mediastinum/Nodes: There are enlarged mediastinal lymph nodes. Index low left paratracheal lymph node measures 12 mm, previously 8 mm. Subcarinal lymph node measures 2.0 cm, previously 11 mm. Bi hilar lymphoid tissue. No axillary adenopathy. Esophagus is grossly unremarkable. Lungs/Pleura: Fairly diffuse ground-glass and septal thickening with a moderate right pleural effusion and small left pleural effusion. Postoperative changes in the right hemi thorax. Mild collapse/ consolidation in the left lower lobe. Minimal consolidation in the right middle lobe and lingula. Trachea is seen in expiration. Upper Abdomen: Visualized portions of the liver, adrenal gland, kidneys, spleen, pancreas and stomach are grossly unremarkable. No upper abdominal adenopathy. Musculoskeletal: No worrisome lytic or sclerotic lesions. Degenerative changes are seen in the spine. Review of the MIP images confirms the above findings. IMPRESSION: 1. Negative for pulmonary embolus. 2. Congestive heart failure. 3. Scattered areas of collapse/consolidation, possibly due to atelectasis. Difficult to exclude pneumonia. 4. Aortic atherosclerosis and three-vessel coronary artery calcification. 5. Borderline enlarged pulmonary arteries. 6. Mediastinal adenopathy is new from 11/30/2015 and likely due to congestive heart failure. Metastatic disease cannot be definitively excluded in this patient with a  history of lung cancer. Consider follow-up CT chest with contrast in 4-6 weeks, as clinically indicated. Electronically Signed   By: Lorin Picket M.D.   On: 03/10/2016 18:03    Scheduled Meds: . aspirin EC  81 mg Oral QPM  . budesonide (PULMICORT) nebulizer solution  0.25 mg Nebulization BID  . celecoxib  100 mg Oral BID  . enoxaparin (LOVENOX) injection  40 mg Subcutaneous QHS  . furosemide  20 mg Intravenous Q12H  . gabapentin  300 mg  Oral TID  . guaiFENesin  600 mg Oral BID  . insulin aspart  0-5 Units Subcutaneous QHS  . insulin aspart  0-9 Units Subcutaneous TID WC  . insulin detemir  5 Units Subcutaneous BID  . LORazepam  0.5 mg Intravenous Once  . predniSONE  40 mg Oral Q breakfast  . sertraline  100 mg Oral Daily   Continuous Infusions:   Principal Problem:   COPD exacerbation ([name]) Active Problems:   Lung cancer ([place])   S/P lumbar spinal fusion   Diabetes mellitus type 2 in obese [facility])   Depression    Time spent: 30 minutes    Barton Dubois  [facility] Pager [phone]. If 7PM-7AM, please contact night-coverage at [email or web address], password [facility] 03/11/2016, 11:24 AM  LOS: 0 days

## 2016-03-11 NOTE — Progress Notes (Signed)
Patient/Family oriented to room. Information packet given to patient/family. Admission inpatient armband information verified with patient/family to include name and date of birth and placed on patient arm. Side rails up x 2, fall assessment and education completed with patient/family. Call light within reach. Patient/family able to voice and demonstrate understanding of unit orientation instructions

## 2016-03-11 NOTE — Progress Notes (Signed)
Inpatient Diabetes Program Recommendations  AACE/ADA: New Consensus Statement on Inpatient Glycemic Control (2015)  Target Ranges:  Prepandial:   less than 140 mg/dL      Peak postprandial:   less than 180 mg/dL (1-2 hours)      Critically ill patients:  140 - 180 mg/dL   Lab Results  Component Value Date   GLUCAP 282 (H) 03/11/2016   HGBA1C 11.8 (H) 02/01/2016    Review of Glycemic Control:  Results for Amy Shepard, Amy Shepard (MRN [id number]) as of 03/11/2016 15:17  Ref. Range 03/10/2016 23:33 03/11/2016 07:53 03/11/2016 12:13  Glucose-Capillary Latest Ref Range: 65 - 99 mg/dL 428 (H) 177 (H) 282 (H)   Diabetes history: Type 2 diabetes Outpatient Diabetes medications: Invokana 300 mg with breakfast, Victoza 1.8 mg daily, Janumet 50-1000 mg bid Current orders for Inpatient glycemic control:  Novolog sensitive tid with meals and HS, Levemir 5 units bid, Prednisone 40 mg with breakfast  Inpatient Diabetes Program Recommendations:    While on PO Prednisone, consider adding Novolog meal coverage 5 units tid with meals.  Also note that A1C>11% in July, 2017.  May need insulin at discharge based on A1C.  Consider increasing Levemir to 15 units daily.   Thanks, Adah Perl, RN, BC-ADM Inpatient Diabetes Coordinator Pager [phone] (8a-5p)

## 2016-03-12 ENCOUNTER — Inpatient Hospital Stay (HOSPITAL_COMMUNITY): Payer: BLUE CROSS/BLUE SHIELD

## 2016-03-12 DIAGNOSIS — R55 Syncope and collapse: Secondary | ICD-10-CM

## 2016-03-12 DIAGNOSIS — Z981 Arthrodesis status: Secondary | ICD-10-CM

## 2016-03-12 DIAGNOSIS — J441 Chronic obstructive pulmonary disease with (acute) exacerbation: Principal | ICD-10-CM

## 2016-03-12 DIAGNOSIS — I5033 Acute on chronic diastolic (congestive) heart failure: Secondary | ICD-10-CM

## 2016-03-12 DIAGNOSIS — E119 Type 2 diabetes mellitus without complications: Secondary | ICD-10-CM

## 2016-03-12 DIAGNOSIS — C3431 Malignant neoplasm of lower lobe, right bronchus or lung: Secondary | ICD-10-CM

## 2016-03-12 DIAGNOSIS — E669 Obesity, unspecified: Secondary | ICD-10-CM

## 2016-03-12 LAB — ECHOCARDIOGRAM COMPLETE
Height: 61 in
WEIGHTICAEL: 2734.4 [oz_av]

## 2016-03-12 LAB — BASIC METABOLIC PANEL
Anion gap: 9 (ref 5–15)
BUN: 27 mg/dL — AB (ref 6–20)
CHLORIDE: 99 mmol/L — AB (ref 101–111)
CO2: 31 mmol/L (ref 22–32)
CREATININE: 0.67 mg/dL (ref 0.44–1.00)
Calcium: 9.8 mg/dL (ref 8.9–10.3)
GFR calc Af Amer: >60 mL/min (ref >60)
GFR calc non Af Amer: >60 mL/min (ref >60)
GLUCOSE: 214 mg/dL — AB (ref 65–99)
POTASSIUM: 3.6 mmol/L (ref 3.5–5.1)
Sodium: 139 mmol/L (ref 135–145)

## 2016-03-12 LAB — GLUCOSE, CAPILLARY
GLUCOSE-CAPILLARY: 275 mg/dL — AB (ref 65–99)
Glucose-Capillary: 147 mg/dL — ABNORMAL HIGH (ref 65–99)
Glucose-Capillary: 271 mg/dL — ABNORMAL HIGH (ref 65–99)
Glucose-Capillary: 309 mg/dL — ABNORMAL HIGH (ref 65–99)

## 2016-03-12 MED ORDER — PREDNISONE 20 MG PO TABS
20.0000 mg | ORAL_TABLET | Freq: Every day | ORAL | Status: DC
  Administered 2016-03-13: 20 mg via ORAL
  Filled 2016-03-12: qty 1

## 2016-03-12 MED ORDER — INSULIN ASPART 100 UNIT/ML ~~LOC~~ SOLN
5.0000 [IU] | Freq: Three times a day (TID) | SUBCUTANEOUS | Status: DC
  Administered 2016-03-12 – 2016-03-13 (×2): 5 [IU] via SUBCUTANEOUS

## 2016-03-12 MED ORDER — INSULIN DETEMIR 100 UNIT/ML ~~LOC~~ SOLN
10.0000 [IU] | Freq: Two times a day (BID) | SUBCUTANEOUS | Status: DC
  Administered 2016-03-12 – 2016-03-13 (×2): 10 [IU] via SUBCUTANEOUS
  Filled 2016-03-12 (×3): qty 0.1

## 2016-03-12 MED ORDER — IPRATROPIUM-ALBUTEROL 0.5-2.5 (3) MG/3ML IN SOLN
3.0000 mL | Freq: Three times a day (TID) | RESPIRATORY_TRACT | Status: DC
  Administered 2016-03-12 – 2016-03-13 (×4): 3 mL via RESPIRATORY_TRACT
  Filled 2016-03-12 (×4): qty 3

## 2016-03-12 NOTE — Progress Notes (Signed)
Results for NEYRA, PETTIE (MRN [id number]) as of 03/12/2016 11:48  Ref. Range 03/11/2016 07:53 03/11/2016 12:13 03/11/2016 16:55 03/11/2016 21:17 03/12/2016 08:15  Glucose-Capillary Latest Ref Range: 65 - 99 mg/dL 177 (H) 282 (H) 305 (H) 339 (H) 147 (H)   Postprandial blood sugars continue to be greater than 180 mg/dl.  Recommend adding Novolog 5 units TID with meals if eating at least 50% of meal and while on steroids.  Will continue to monitor blood sugars while in the hospital. Harvel Ricks RN BSN CDE

## 2016-03-12 NOTE — Progress Notes (Signed)
*  PRELIMINARY RESULTS* Echocardiogram 2D Echocardiogram has been performed.  Leavy Cella 03/12/2016, 4:42 PM

## 2016-03-12 NOTE — Progress Notes (Signed)
SATURATION QUALIFICATIONS: (This note is used to comply with regulatory documentation for home oxygen)  Patient Saturations on Room Air at Rest = 77%  Patient Saturations on Room Air while Ambulating = 97%  Pt. consistently sating in high 70's typically 77% on room air, despite O2. Pt. Stayed around 77% on room air, 1L, 2L, and 3L at rest. Once ambulating without oxygen, patient saturations improved to high 90's. O2 probe was getting a good wave form. Tested multiple sites: toe, ear, and forehead with ICU sensor. All sites rated similarly. Finger was not used to test due to patient having acrylic nails.

## 2016-03-12 NOTE — Progress Notes (Signed)
[facility] PROGRESS NOTE  Amy Shepard YQM:[id number] DOB: 1946-05-14 DOA: 03/10/2016 PCP: [name], MD  Interim summary and HPI  70 y.o. 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 Patient had recently had a lumbar fusion done by Dr. [name] on 8/11. She had gone into this office today for follow-up postopt visit when it was seen then her O2 saturations were in the 70s. Otherwise, she had been doing fine since the surgery. Associated symptoms included some intermittent wheezing and cough. The coughstarted today and was productive with whitish sputum. Also over the last 3 months she reported having lost approximately 20 pounds. However, she notes that since the surgery she has gained approximately 12 pounds back as she has not been as active as normal.  Denies having any chest pain, leg swelling, nausea, vomiting, focal weakness, orthopnea, or hemoptysis.Patient notes a previous history of lung cancer which was diagnosed sometime around 2008-2009 which she has been followed by Dr. [name] of oncology.  On admission to the emergency department patient was seen have O2 sats as low as 85% on room air. She was placed to nasal cannula oxygen with improvement of O2 sats rations into the low 90s. Respirations seen as high as 24. All other vital signs within normal limits. Lab work shows relatively unremarkable except for BNP of 167.6. CXR demonstrating increase vascular congestion and atelectasis; also with bronchitic changes.  Assessment/Plan: Acute resp failure with hypoxia: clinically improving. Multifactorial; COPD exacerbation, acute and acute on chronic diastolic heart failure -continue IV lasix -daily weights, low sodium diet and strict intake and output, weight coming down,  -will check 2-D echo - which is still  pending at this time.  -will also continue treatment with steroids, pulmicort, doxycycline and nebulizer treatment. -continue mucinex and PRN oxygen (wean to room air as tolerated)  Diabetes mellitus type 2: Blood glucose on admission 251. -continue holding oral hypoglycemic regimen (Victoza, Janumet, and Invokana) -will use SSI and levemir, plus prandial coverage  CBG (last 3)   Recent Labs  03/11/16 2117 03/12/16 0815 03/12/16 1216  GLUCAP 339* 147* 271*   History of lung cancer with new lymphadenopathy: Initially diagnosed back in 2009 with stage II B mixed NSCLC. S/p resection and subsequent chemotherapy completed in 2010. Patient was last seen  By Dr. [name] and discharged from his care in 11/2015 after repeat CT scans of chest and abdomen and pelvis were seen to be stable.   - Radiology Recommended repeat CT scan in 4-6 weeks.  Recent lumbar fusion: Patient is s/p lumbar fusion performed by Dr. [name] on 8/11.  - Continue PRN analgesics  Depression  - continue Zoloft  Code Status: Full Family Communication: no family at bedside  Disposition Plan: home when medically stable; continue weaning O2 supplementation, continue IV lasix, tx for COPD and follow echo results.  Consultants:  None   Procedures:  See below for x-ray reports  2-D echo: pending   Antibiotics:  Doxycycline (8/29)  HPI/Subjective: Patient is afebrile and denying CP. She reports being weaned with minimal exertion and having mild SOB when laying flat.  Objective: Vitals:   03/12/16 0612 03/12/16 1348  BP: (!) 134/58 93/68  Pulse: 67 77  Resp: 18 18  Temp: 98.5 F (36.9 C) 98.5 F (36.9 C)    Intake/Output Summary (Last 24 hours) at 03/12/16  1408 Last data filed at 03/12/16 0945  Gross per 24 hour  Intake             1180 ml  Output              800 ml  Net              380 ml   Filed Weights   03/11/16 0000 03/11/16 0958 03/12/16 0612  Weight: 79.4 kg (175 lb 0.7 oz) 78 kg (171  lb 15.3 oz) 77.5 kg (170 lb 14.4 oz)    Exam:   General:  Afebrile, reports SOB and feeling weaned with minimal exertion. Denies CP and nausea/vomiting.  Cardiovascular: S1 and S2, no rubs, no gallops  Respiratory: with slight exp wheezing, decrease BS at bases and positive rhonchi  Abdomen: soft, NT, ND, positive BS  Musculoskeletal: trace edema bilaterally, no cyanosis   Data Reviewed: Basic Metabolic Panel:  Recent Labs Lab 03/10/16 1306 03/11/16 0611 03/12/16 0352  NA 137 137 139  K 4.2 4.2 3.6  CL 101 100* 99*  CO2 _0 GLUCOSE 251* 225* 214*  BUN 17 15 27*  CREATININE 0.73 0.61 0.67  CALCIUM 9.3 9.5 9.8   CBC:  Recent Labs Lab 03/10/16 1306 03/11/16 0611  WBC 7.0 8.3  HGB 12.2 11.9*  HCT 37.7 38.5  MCV 88.5 89.7  PLT 275 263   BNP (last 3 results)  Recent Labs  03/10/16 1832  BNP 167.6*   CBG:  Recent Labs Lab 03/11/16 1213 03/11/16 1655 03/11/16 2117 03/12/16 0815 03/12/16 1216  GLUCAP 282* 305* 339* 147* 271*   Studies: Ct Angio Chest Pe W And/or Wo Contrast  Result Date: 03/10/2016 CLINICAL DATA:  Shortness of breath with exertion. EXAM: CT ANGIOGRAPHY CHEST WITH CONTRAST TECHNIQUE: Multidetector CT imaging of the chest was performed using the standard protocol during bolus administration of intravenous contrast. Multiplanar CT image reconstructions and MIPs were obtained to evaluate the vascular anatomy. CONTRAST:  100 cc Isovue 370. COMPARISON:  11/30/2015. FINDINGS: Cardiovascular: Negative for pulmonary embolus. Atherosclerotic calcification of the arterial vasculature, including three-vessel involvement of the coronary arteries. Pulmonary arteries are borderline enlarged. Heart is enlarged. No pericardial effusion. Mediastinum/Nodes: There are enlarged mediastinal lymph nodes. Index low left paratracheal lymph node measures 12 mm, previously 8 mm. Subcarinal lymph node measures 2.0 cm, previously 11 mm. Bi hilar lymphoid tissue. No  axillary adenopathy. Esophagus is grossly unremarkable. Lungs/Pleura: Fairly diffuse ground-glass and septal thickening with a moderate right pleural effusion and small left pleural effusion. Postoperative changes in the right hemi thorax. Mild collapse/ consolidation in the left lower lobe. Minimal consolidation in the right middle lobe and lingula. Trachea is seen in expiration. Upper Abdomen: Visualized portions of the liver, adrenal gland, kidneys, spleen, pancreas and stomach are grossly unremarkable. No upper abdominal adenopathy. Musculoskeletal: No worrisome lytic or sclerotic lesions. Degenerative changes are seen in the spine. Review of the MIP images confirms the above findings. IMPRESSION: 1. Negative for pulmonary embolus. 2. Congestive heart failure. 3. Scattered areas of collapse/consolidation, possibly due to atelectasis. Difficult to exclude pneumonia. 4. Aortic atherosclerosis and three-vessel coronary artery calcification. 5. Borderline enlarged pulmonary arteries. 6. Mediastinal adenopathy is new from 11/30/2015 and likely due to congestive heart failure. Metastatic disease cannot be definitively excluded in this patient with a history of lung cancer. Consider follow-up CT chest with contrast in 4-6 weeks, as clinically indicated. Electronically Signed   By: Lorin Picket M.D.   On: 03/10/2016  18:03    Scheduled Meds: . aspirin EC  81 mg Oral QPM  . budesonide (PULMICORT) nebulizer solution  0.25 mg Nebulization BID  . celecoxib  100 mg Oral BID  . doxycycline  100 mg Oral Q12H  . enoxaparin (LOVENOX) injection  40 mg Subcutaneous QHS  . furosemide  20 mg Intravenous Q12H  . gabapentin  300 mg Oral TID  . guaiFENesin  600 mg Oral BID  . insulin aspart  0-5 Units Subcutaneous QHS  . insulin aspart  0-9 Units Subcutaneous TID WC  . insulin aspart  5 Units Subcutaneous TID WC  . insulin detemir  10 Units Subcutaneous BID  . ipratropium-albuterol  3 mL Nebulization TID  . LORazepam   0.5 mg Intravenous Once  . predniSONE  40 mg Oral Q breakfast  . sertraline  100 mg Oral Daily   Continuous Infusions:   Principal Problem:   COPD exacerbation ([name]) Active Problems:   Lung cancer ([place])   S/P lumbar spinal fusion   Diabetes mellitus type 2 in obese [facility])   Depression  Time spent: 30 minutes  Perris Tripathi [employer] [phone]. If 7PM-7AM, please contact night-coverage at [email or web address], password [email or web address] 03/12/2016, 2:08 PM  LOS: 1 day

## 2016-03-13 DIAGNOSIS — I5031 Acute diastolic (congestive) heart failure: Secondary | ICD-10-CM

## 2016-03-13 LAB — BASIC METABOLIC PANEL
ANION GAP: 10 (ref 5–15)
BUN: 34 mg/dL — ABNORMAL HIGH (ref 6–20)
CALCIUM: 9.9 mg/dL (ref 8.9–10.3)
CO2: 30 mmol/L (ref 22–32)
CREATININE: 0.81 mg/dL (ref 0.44–1.00)
Chloride: 94 mmol/L — ABNORMAL LOW (ref 101–111)
Glucose, Bld: 181 mg/dL — ABNORMAL HIGH (ref 65–99)
Potassium: 3.6 mmol/L (ref 3.5–5.1)
SODIUM: 134 mmol/L — AB (ref 135–145)

## 2016-03-13 LAB — HEMOGLOBIN A1C
HEMOGLOBIN A1C: 9.2 % — AB (ref 4.8–5.6)
MEAN PLASMA GLUCOSE: 217 mg/dL

## 2016-03-13 LAB — GLUCOSE, CAPILLARY: Glucose-Capillary: 170 mg/dL — ABNORMAL HIGH (ref 65–99)

## 2016-03-13 MED ORDER — POTASSIUM CHLORIDE ER 10 MEQ PO TBCR: 10.0000 meq | EXTENDED_RELEASE_TABLET | Freq: Every day | ORAL | 0 refills | Status: DC

## 2016-03-13 MED ORDER — CELECOXIB 100 MG PO CAPS: 100.0000 mg | ORAL_CAPSULE | Freq: Every day | ORAL | 1 refills | Status: DC | PRN

## 2016-03-13 MED ORDER — DOXYCYCLINE HYCLATE 100 MG PO TABS: 100.0000 mg | ORAL_TABLET | Freq: Two times a day (BID) | ORAL | 0 refills | Status: AC

## 2016-03-13 MED ORDER — IPRATROPIUM-ALBUTEROL 0.5-2.5 (3) MG/3ML IN SOLN: 3.0000 mL | RESPIRATORY_TRACT | 0 refills | Status: DC | PRN

## 2016-03-13 MED ORDER — OMEPRAZOLE MAGNESIUM 20 MG PO TBEC: 20.0000 mg | DELAYED_RELEASE_TABLET | Freq: Every day | ORAL | 0 refills | Status: DC

## 2016-03-13 MED ORDER — PREDNISONE 20 MG PO TABS: 20.0000 mg | ORAL_TABLET | Freq: Every day | ORAL | 0 refills | Status: AC

## 2016-03-13 MED ORDER — FUROSEMIDE 20 MG PO TABS: 20.0000 mg | ORAL_TABLET | Freq: Every day | ORAL | 0 refills | Status: DC

## 2016-03-13 NOTE — Progress Notes (Signed)
[name] to be D/C'd to home per MD order.  Discussed with the patient and all questions fully answered.  VSS, Skin clean, dry and intact without evidence of skin break down, no evidence of skin tears noted. IV catheter discontinued intact. Site without signs and symptoms of complications. Dressing and pressure applied.  An After Visit Summary was printed and given to the patient. Patient received prescriptions.  D/c education completed with patient/family including follow up instructions, medication list, d/c activities limitations if indicated, with other d/c instructions as indicated by MD - patient able to verbalize understanding, all questions fully answered.   Patient instructed to return to ED, call 911, or call MD for any changes in condition.   Patient escorted via [place], and D/C home via private auto.  Morley Kos Price 03/13/2016 11:54 AM

## 2016-03-13 NOTE — Discharge Instructions (Signed)
Chronic Obstructive Pulmonary Disease Exacerbation Chronic obstructive pulmonary disease (COPD) is a common lung problem. In COPD, the flow of air from the lungs is limited. COPD exacerbations are times that breathing gets worse and you need extra treatment. Without treatment they can be life threatening. If they happen often, your lungs can become more damaged. If your COPD gets worse, your doctor may treat you with:  Medicines.  Oxygen.  Different ways to clear your airway, such as using a mask. HOME CARE  Do not smoke.  Avoid tobacco smoke and other things that bother your lungs.  If given, take your antibiotic medicine as told. Finish the medicine even if you start to feel better.  Only take medicines as told by your doctor.  Drink enough fluids to keep your pee (urine) clear or pale yellow (unless your doctor has told you not to).  Use a cool mist machine (vaporizer).  If you use oxygen or a machine that turns liquid medicine into a mist (nebulizer), continue to use them as told.  Keep up with shots (vaccinations) as told by your doctor.  Exercise regularly.  Eat healthy foods.  Keep all doctor visits as told. GET HELP RIGHT AWAY IF:  You are very short of breath and it gets worse.  You have trouble talking.  You have bad chest pain.  You have blood in your spit (sputum).  You have a fever.  You keep throwing up (vomiting).  You feel weak, or you pass out (faint).  You feel confused.  You keep getting worse. MAKE SURE YOU:  Understand these instructions.  Will watch your condition.  Will get help right away if you are not doing well or get worse.   This information is not intended to replace advice given to you by your health care provider. Make sure you discuss any questions you have with your health care provider.   Document Released: 06/19/2011 Document Revised: 07/21/2014 Document Reviewed: 03/04/2013 Elsevier Interactive Patient Education 2016  Else[employer] Inc[employer]   Cough, Adult Coughing is a reflex that clears your throat and your airways. Coughing helps to heal and protect your lungs. It is normal to cough occasionally, but a cough that happens with other symptoms or lasts a long time may be a sign of a condition that needs treatment. A cough may last only 2-3 weeks (acute), or it may last longer than 8 weeks (chronic). CAUSES Coughing is commonly caused by:  Breathing in substances that irritate your lungs.  A viral or bacterial respiratory infection.  Allergies.  Asthma.  Postnasal drip.  Smoking.  Acid backing up from the stomach into the esophagus (gastroesophageal reflux).  Certain medicines.  Chronic lung problems, including COPD (or rarely, lung cancer).  Other medical conditions such as heart failure. HOME CARE INSTRUCTIONS  Pay attention to any changes in your symptoms. Take these actions to help with your discomfort:  Take medicines only as told by your health care provider.  If you were prescribed an antibiotic medicine, take it as told by your health care provider. Do not stop taking the antibiotic even if you start to feel better.  Talk with your health care provider before you take a cough suppressant medicine.  Drink enough fluid to keep your urine clear or pale yellow.  If the air is dry, use a cold steam vaporizer or humidifier in your bedroom or your home to help loosen secretions.  Avoid anything that causes you to cough at work or at home.  If your cough is worse at night, try sleeping in a semi-upright position.  Avoid cigarette smoke. If you smoke, quit smoking. If you need help quitting, ask your health care provider.  Avoid caffeine.  Avoid alcohol.  Rest as needed. SEEK MEDICAL CARE IF:   You have new symptoms.  You cough up pus.  Your cough does not get better after 2-3 weeks, or your cough gets worse.  You cannot control your cough with suppressant medicines and you are  losing sleep.  You develop pain that is getting worse or pain that is not controlled with pain medicines.  You have a fever.  You have unexplained weight loss.  You have night sweats. SEEK IMMEDIATE MEDICAL CARE IF:  You cough up blood.  You have difficulty breathing.  Your heartbeat is very fast.   This information is not intended to replace advice given to you by your health care provider. Make sure you discuss any questions you have with your health care provider.   Document Released: 12/27/2010 Document Revised: 03/21/2015 Document Reviewed: 09/06/2014 Elsevier Interactive Patient Education 2016 [employer].   How to Use a Nebulizer If you have asthma or other breathing problems, you might need to breathe in (inhale) medicine. This can be done with a nebulizer. A nebulizer is a device that turns liquid medicine into a mist that you can inhale.  There are different kinds of nebulizers. Most are small. With some, you breathe in through a mouthpiece. With others, a mask fits over your nose and mouth. Most nebulizers must be connected to a small air compressor. Air is forced through tubing from the compressor to the nebulizer. The forced air changes the liquid into a fine spray. RISKS AND COMPLICATIONS The nebulizer must work properly for it to help your breathing. If the nebulizer does not produce mist, or if foam comes out, this indicates that the nebulizer is not working properly. Sometimes a filter can get clogged, or there might be a problem with the air compressor. Check the instruction booklet that came with your nebulizer. It should tell you how to fix problems or where to call for help. You should have at least one extra nebulizer at home. That way, you will always have one when you need it.  HOW TO PREPARE BEFORE USING THE NEBULIZER Take these steps before using the nebulizer: 1. Check your medicine. Make sure it has not expired and is not damaged in any way.  2. Wash your  hands with soap and water.  3. Put all the parts of your nebulizer on a sturdy, flat surface. Make sure the tubing connects the compressor and the nebulizer. 4. Measure the liquid medicine according to your health care provider's instructions. Pour it into the nebulizer. 5. Attach the mouthpiece or mask.  6. Test the nebulizer by turning it on to make sure a spray is coming out. Then, turn it off.  HOW TO USE THE NEBULIZER 1. Sit down and focus on staying relaxed.  2. If your nebulizer has a mask, put it over your nose and mouth. If you use a mouthpiece, put it in your mouth. Press your lips firmly around the mouthpiece. 3. Turn on the nebulizer.  4. Breathe out.  5. Some nebulizers have a finger valve. If yours does, cover up the air hole so the air gets to the nebulizer. 6. Once the medicine begins to mist out, take slow, deep breaths. If there is a finger valve, release it at the  end of your breath. 7. Continue taking slow, deep breaths until the nebulizer is empty.  Be sure to stop the machine at any point if you start coughing or if the medicine foams or bubbles. HOW TO CLEAN THE NEBULIZER  The nebulizer and all its parts must be kept very clean. Follow the manufacturer's instructions for cleaning. For most nebulizers, you should follow these guidelines:  Wash the nebulizer after each use. Use warm water and soap. Rinse it well. Shake the nebulizer to remove extra water. Put it on a clean towel until it is completely dry. To make sure it is dry, put the nebulizer back together. Turn on the compressor for a few minutes. This will blow air through the nebulizer.   Do not wash the tubing or the finger valve.   Store the nebulizer in a dust-free place.   Inspect the filter every week. Replace it any time it looks dirty.   Sometimes the nebulizer will need a more complete cleaning. The instruction booklet should say how often you need to do this. SEEK MEDICAL CARE IF:   You  continue to have difficulty breathing.   You have trouble using the nebulizer.    This information is not intended to replace advice given to you by your health care provider. Make sure you discuss any questions you have with your health care provider.   Document Released: 06/18/2009 Document Revised: 07/21/2014 Document Reviewed: 12/20/2012 Elsevier Interactive Patient Education 2016 [employer].  Heart Failure Heart failure means your heart has trouble pumping blood. This makes it hard for your body to work well. Heart failure is usually a long-term (chronic) condition. You must take good care of yourself and follow your doctor's treatment plan. HOME CARE  Take your heart medicine as told by your doctor.  Do not stop taking medicine unless your doctor tells you to.  Do not skip any dose of medicine.  Refill your medicines before they run out.  Take other medicines only as told by your doctor or pharmacist.  Stay active if told by your doctor. The elderly and people with severe heart failure should talk with a doctor about physical activity.  Eat heart-healthy foods. Choose foods that are without trans fat and are low in saturated fat, cholesterol, and salt (sodium). This includes fresh or frozen fruits and vegetables, fish, lean meats, fat-free or low-fat dairy foods, whole grains, and high-fiber foods. Lentils and dried peas and beans (legumes) are also good choices.  Limit salt if told by your doctor.  Cook in a healthy way. Roast, grill, broil, bake, poach, steam, or stir-fry foods.  Limit fluids as told by your doctor.  Weigh yourself every morning. Do this after you pee (urinate) and before you eat breakfast. Write down your weight to give to your doctor.  Take your blood pressure and write it down if your doctor tells you to.  Ask your doctor how to check your pulse. Check your pulse as told.  Lose weight if told by your doctor.  Stop smoking or chewing tobacco.  Do not use gum or patches that help you quit without your doctor's approval.  Schedule and go to doctor visits as told.  Nonpregnant women should have no more than 1 drink a day. Men should have no more than 2 drinks a day. Talk to your doctor about drinking alcohol.  Stop illegal drug use.  Stay current with shots (immunizations).  Manage your health conditions as told by your doctor.  Learn to manage your stress.  Rest when you are tired.  If it is really hot outside:  Avoid intense activities.  Use air conditioning or fans, or get in a cooler place.  Avoid caffeine and alcohol.  Wear loose-fitting, lightweight, and light-colored clothing.  If it is really cold outside:  Avoid intense activities.  Layer your clothing.  Wear mittens or gloves, a hat, and a scarf when going outside.  Avoid alcohol.  Learn about heart failure and get support as needed.  Get help to maintain or improve your quality of life and your ability to care for yourself as needed. GET HELP IF:   You gain weight quickly.  You are more short of breath than usual.  You cannot do your normal activities.  You tire easily.  You cough more than normal, especially with activity.  You have any or more puffiness (swelling) in areas such as your hands, feet, ankles, or belly (abdomen).  You cannot sleep because it is hard to breathe.  You feel like your heart is beating fast (palpitations).  You get dizzy or light-headed when you stand up. GET HELP RIGHT AWAY IF:   You have trouble breathing.  There is a change in mental status, such as becoming less alert or not being able to focus.  You have chest pain or discomfort.  You faint. MAKE SURE YOU:   Understand these instructions.  Will watch your condition.  Will get help right away if you are not doing well or get worse.   This information is not intended to replace advice given to you by your health care provider. Make sure you  discuss any questions you have with your health care provider.   Document Released: 04/08/2008 Document Revised: 07/21/2014 Document Reviewed: 08/16/2012 Elsevier Interactive Patient Education [employer].

## 2016-03-13 NOTE — Discharge Summary (Signed)
Physician Discharge Summary  Amy Shepard QJJ:[id number] DOB: 1946/05/09 DOA: 03/10/2016  PCP: [name], MD  Admit date: 03/10/2016 Discharge date: 03/13/2016  Admitted From: Home Disposition:  Home  Recommendations for Outpatient Follow-up:  1. Follow up with PCP in 1 weeks 2. 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 [name] on 8/11. She had gone into this office today for follow-up postopt visit when it was seen then her O2 saturations were in the 70s. Otherwise, she had been doing fine since the surgery. Associated symptoms included some intermittent wheezing and cough. The coughstarted today and was productive with whitish sputum. Also over the last 3 months she reported having lost approximately 20 pounds. However, she notes that since the surgery she has gained approximately 12 pounds back as she has not been as active as normal. Denies having any chest pain, leg swelling, nausea, vomiting, focal weakness, orthopnea, or hemoptysis.Patient notes a previous history of lung cancer which was diagnosed sometime around 2008-2009 which she has been followed by Dr. [name] of oncology.   On admission to the emergency department patient was seen have O2 sats as low as 85% on room air. She was placed to nasal cannula oxygen with improvement of O2 sats rations into the low 90s. Respirations seen as high as 24. All other vital signs within normal limits. Lab work shows relatively unremarkable except for BNP of 167.6. CXR demonstrating increase vascular congestion and atelectasis;  also with bronchitic changes.   Assessment/Plan: Acute resp failure with hypoxia: clinically improving. Multifactorial; COPD exacerbation, acute and acute on chronic diastolic heart failure -Given IV lasix with good diuresis, weight coming down.  -daily weights, low sodium diet and strict intake and output, weight coming down,  -2-D echo - EF 60-65% with grade 2 diastolic -treated with steroids, pulmicort, doxycycline and nebulizer treatment.  Pt tolerated and rapidly improved.  -continue mucinex and PRN oxygen (weaned to room air) - discharged on low dose lasix and potassium and close follow up with her cardiologist [name].    Diabetes mellitus type 2: Blood glucose on admission 251. -continue holding oral hypoglycemic regimen (Victoza, Janumet, and Invokana) -resume home meds at discharge  CBG (last 3)   Recent Labs  03/12/16 1714 03/12/16 2101 03/13/16 0750  GLUCAP 309* 275* 170*   History of lung cancer with new lymphadenopathy:Initially diagnosed back in 2009 with stage II B mixed NSCLC. S/p resection and subsequent chemotherapy completed in 2010. Patient was last seen By Dr. [name] and discharged from his care in 5/2017after repeat CT scans of chest and abdomen and pelvis were seen to be stable.  - RadiologyRecommended repeat CT scan in 4-6 weeks.  Recent lumbar fusion: Patient is s/p lumbar fusion performed by Dr. [name] on 8/11.  - Continue PRN analgesics  Depression  - continue Zoloft  Code Status: Full Family Communication: no family at bedside  Disposition Plan: home   Consultants:  None   Procedures:  See below for x-ray reports  ECHOCARDIOGRAM Study Conclusions  - Left ventricle: The cavity size was normal. Wall thickness was   increased in a pattern of mild LVH.  Systolic function was normal.   The estimated ejection fraction was in the range of 60% to 65%.   Wall motion was normal; there were no regional wall motion   abnormalities.  Features are consistent with a pseudonormal left   ventricular filling pattern, with concomitant abnormal relaxation   and increased filling pressure (grade 2 diastolic dysfunction).  Antibiotics:  Doxycycline (8/29)  Discharge Diagnoses:  Principal Problem:   COPD exacerbation ([place]) Active Problems:   Lung cancer ([name])   S/P lumbar spinal fusion   Diabetes mellitus type 2 in obese [name])   Depression  Discharge Instructions  Discharge Instructions    Diet - low sodium heart healthy    Complete by:  As directed   Increase activity slowly    Complete by:  As directed       Medication List    STOP taking these medications   naproxen sodium 220 MG tablet Commonly known as:  ANAPROX   oxyCODONE-acetaminophen 5-325 MG tablet Commonly known as:  PERCOCET/ROXICET     TAKE these medications   aspirin EC 81 MG tablet Take 81 mg by mouth every evening.   celecoxib 100 MG capsule Commonly known as:  CELEBREX Take 1 capsule (100 mg total) by mouth daily as needed for mild pain. What changed:  when to take this  reasons to take this   doxycycline 100 MG tablet Commonly known as:  VIBRA-TABS Take 1 tablet (100 mg total) by mouth every 12 (twelve) hours.   furosemide 20 MG tablet Commonly known as:  LASIX Take 1 tablet (20 mg total) by mouth daily.   gabapentin 300 MG capsule Commonly known as:  NEURONTIN Take 300 mg by mouth 3 (three) times daily.   HYDROcodone-acetaminophen 5-325 MG tablet Commonly known as:  NORCO/VICODIN Take 1 tablet by mouth every 6 (six) hours as needed. What changed:  reasons to take this   INVOKANA 100 MG Tabs tablet Generic drug:  canagliflozin Take 300 mg by mouth daily before breakfast.   ipratropium-albuterol 0.5-2.5 (3) MG/3ML Soln Commonly known as:  DUONEB Take 3 mLs by nebulization every 4 (four) hours as needed (wheezing, Shortness of breath).   methocarbamol 500 MG tablet Commonly known as:  ROBAXIN Take 1 tablet (500 mg  total) by mouth every 6 (six) hours as needed for muscle spasms.   omeprazole 20 MG tablet Commonly known as:  PRILOSEC OTC Take 1 tablet (20 mg total) by mouth daily.   potassium chloride 10 MEQ tablet Commonly known as:  K-DUR Take 1 tablet (10 mEq total) by mouth daily.   predniSONE 20 MG tablet Commonly known as:  DELTASONE Take 1 tablet (20 mg total) by mouth daily with breakfast. Start taking on:  03/14/2016   sertraline 100 MG tablet Commonly known as:  ZOLOFT Take 100 mg by mouth daily.   sitaGLIPtin-metformin 50-1000 MG tablet Commonly known as:  JANUMET Take 1 tablet by mouth 2 (two) times daily with a meal.   TURMERIC PO Take 1 capsule by mouth daily.   VICTOZA 18 MG/3ML Sopn Generic drug:  Liraglutide Inject 1.8 mg into the skin daily.      Follow-up Information    WEBB, CAROL D, MD. Schedule an appointment as soon as possible for a visit on 03/25/2016.   Specialty:  Family Medicine Why:  Hospital Follow Up/// Appointment with Dr. Justin Mend is on 03/25/16 at [place] information: [address] [place] [address] [phone]  Ena Dawley, MD. Schedule an appointment as soon as possible for a visit in 2 weeks.   Specialty:  Cardiology Why:  Hospital Follow Up CHF  Contact information: [address] [place] [address] [phone]          Allergies  Allergen Reactions  . Amoxicillin Anaphylaxis, Hives and Rash    Has patient had a PCN reaction causing immediate rash, facial/tongue/throat swelling, SOB or lightheadedness with hypotension: YES Positive reaction causing SEVERE RASH INVOLVING MUCUS MEMBRANES/SKIN NECROSIS: YES Reaction that required HOSPITALIZATION: YES Reaction occurring within the last 10 years: NO   Procedures/Studies: Dg Chest 2 View  Result Date: 03/10/2016 CLINICAL DATA:  Cough, shortness of breath. EXAM: CHEST  2 VIEW COMPARISON:  Radiographs February 01, 2016. FINDINGS: Stable  cardiomediastinal silhouette. Atherosclerosis of thoracic aorta is noted. No pneumothorax is noted. Minimal bilateral pleural effusions are noted with left greater than right. Mild bibasilar subsegmental atelectasis or edema is noted. Bony thorax is unremarkable. IMPRESSION: Mild bibasilar subsegmental atelectasis or edema with minimal bilateral pleural effusions. Aortic atherosclerosis. Electronically Signed   By: Marijo Conception, M.D.   On: 03/10/2016 13:52   Dg Lumbar Spine 2-3 Views  Result Date: 02/22/2016 CLINICAL DATA:  L3-4/L4-5 MAS PLIF for Spondylolisthesis. Fluoro Time 1 min, 16 secs. EXAM: LUMBAR SPINE - 2-3 VIEW; DG C-ARM 61-120 MIN COMPARISON:  01/25/2016 FINDINGS: Two submitted images show placement of bilateral pedicle screws interconnecting rods fusing L3, L4 and L5. There are radiolucent disc spacers well centered maintaining disc height at L3-L4 and L4-L5. Orthopedic hardware is well-seated. No acute fracture or evidence of an operative complication. IMPRESSION: 1. Imaging following L3 through L5 posterior lumbar spine fusion as described. Electronically Signed   By: Lajean Manes M.D.   On: 02/22/2016 16:12   Ct Angio Chest Pe W And/or Wo Contrast  Result Date: 03/10/2016 CLINICAL DATA:  Shortness of breath with exertion. EXAM: CT ANGIOGRAPHY CHEST WITH CONTRAST TECHNIQUE: Multidetector CT imaging of the chest was performed using the standard protocol during bolus administration of intravenous contrast. Multiplanar CT image reconstructions and MIPs were obtained to evaluate the vascular anatomy. CONTRAST:  100 cc Isovue 370. COMPARISON:  11/30/2015. FINDINGS: Cardiovascular: Negative for pulmonary embolus. Atherosclerotic calcification of the arterial vasculature, including three-vessel involvement of the coronary arteries. Pulmonary arteries are borderline enlarged. Heart is enlarged. No pericardial effusion. Mediastinum/Nodes: There are enlarged mediastinal lymph nodes. Index low left  paratracheal lymph node measures 12 mm, previously 8 mm. Subcarinal lymph node measures 2.0 cm, previously 11 mm. Bi hilar lymphoid tissue. No axillary adenopathy. Esophagus is grossly unremarkable. Lungs/Pleura: Fairly diffuse ground-glass and septal thickening with a moderate right pleural effusion and small left pleural effusion. Postoperative changes in the right hemi thorax. Mild collapse/ consolidation in the left lower lobe. Minimal consolidation in the right middle lobe and lingula. Trachea is seen in expiration. Upper Abdomen: Visualized portions of the liver, adrenal gland, kidneys, spleen, pancreas and stomach are grossly unremarkable. No upper abdominal adenopathy. Musculoskeletal: No worrisome lytic or sclerotic lesions. Degenerative changes are seen in the spine. Review of the MIP images confirms the above findings. IMPRESSION: 1. Negative for pulmonary embolus. 2. Congestive heart failure. 3. Scattered areas of collapse/consolidation, possibly due to atelectasis. Difficult to exclude pneumonia. 4. Aortic atherosclerosis and three-vessel coronary artery calcification. 5. Borderline enlarged pulmonary arteries. 6. Mediastinal adenopathy is new from 11/30/2015 and likely due to congestive heart failure. Metastatic disease cannot be definitively excluded in this patient with a history  of lung cancer. Consider follow-up CT chest with contrast in 4-6 weeks, as clinically indicated. Electronically Signed   By: Lorin Picket M.D.   On: 03/10/2016 18:03   Dg C-arm 61-120 Min  Result Date: 02/22/2016 CLINICAL DATA:  L3-4/L4-5 MAS PLIF for Spondylolisthesis. Fluoro Time 1 min, 16 secs. EXAM: LUMBAR SPINE - 2-3 VIEW; DG C-ARM 61-120 MIN COMPARISON:  01/25/2016 FINDINGS: Two submitted images show placement of bilateral pedicle screws interconnecting rods fusing L3, L4 and L5. There are radiolucent disc spacers well centered maintaining disc height at L3-L4 and L4-L5. Orthopedic hardware is well-seated. No  acute fracture or evidence of an operative complication. IMPRESSION: 1. Imaging following L3 through L5 posterior lumbar spine fusion as described. Electronically Signed   By: Lajean Manes M.D.   On: 02/22/2016 16:12     Subjective: Pt much better, asking to go home.    Discharge Exam: Vitals:   03/12/16 2038 03/13/16 0459  BP: 109/61 (!) 141/83  Pulse:  71  Resp:  16  Temp: 98.7 F (37.1 C) 97.7 F (36.5 C)   Vitals:   03/12/16 2038 03/13/16 0256 03/13/16 0459 03/13/16 0808  BP: 109/61  (!) 141/83   Pulse:   71   Resp:   16   Temp: 98.7 F (37.1 C)  97.7 F (36.5 C)   TempSrc: Oral  Oral   SpO2: 94%  95% 90%  Weight:  76.8 kg (169 lb 4.8 oz)    Height:        General: Pt is alert, awake, not in acute distress Cardiovascular: RRR, S1/S2 +, no rubs, no gallops Respiratory: CTA bilaterally, no wheezing, no rhonchi Abdominal: Soft, NT, ND, bowel sounds + Extremities: no edema, no cyanosis    The results of significant diagnostics from this hospitalization (including imaging, microbiology, ancillary and laboratory) are listed below for reference.     Microbiology: No results found for this or any previous visit (from the past 240 hour(s)).   Labs: BNP (last 3 results)  Recent Labs  03/10/16 1832  BNP 841.6*   Basic Metabolic Panel:  Recent Labs Lab 03/10/16 1306 03/11/16 0611 03/12/16 0352 03/13/16 0229  NA 137 137 139 134*  K 4.2 4.2 3.6 3.6  CL 101 100* 99* 94*  CO2 _0 GLUCOSE 251* 225* 214* 181*  BUN 17 15 27* 34*  CREATININE 0.73 0.61 0.67 0.81  CALCIUM 9.3 9.5 9.8 9.9   Liver Function Tests: No results for input(s): AST, ALT, ALKPHOS, BILITOT, PROT, ALBUMIN in the last 168 hours. No results for input(s): LIPASE, AMYLASE in the last 168 hours. No results for input(s): AMMONIA in the last 168 hours. CBC:  Recent Labs Lab 03/10/16 1306 03/11/16 0611  WBC 7.0 8.3  HGB 12.2 11.9*  HCT 37.7 38.5  MCV 88.5 89.7  PLT 275 263    Cardiac Enzymes: No results for input(s): CKTOTAL, CKMB, CKMBINDEX, TROPONINI in the last 168 hours. BNP: Invalid input(s): POCBNP CBG:  Recent Labs Lab 03/12/16 0815 03/12/16 1216 03/12/16 1714 03/12/16 2101 03/13/16 0750  GLUCAP 147* 271* 309* 275* 170*   D-Dimer No results for input(s): DDIMER in the last 72 hours. Hgb A1c  Recent Labs  03/12/16 1403  HGBA1C 9.2*   Lipid Profile No results for input(s): CHOL, HDL, LDLCALC, TRIG, CHOLHDL, LDLDIRECT in the last 72 hours. Thyroid function studies No results for input(s): TSH, T4TOTAL, T3FREE, THYROIDAB in the last 72 hours.  Invalid input(s): FREET3 Anemia work up No  results for input(s): VITAMINB12, FOLATE, FERRITIN, TIBC, IRON, RETICCTPCT in the last 72 hours. Urinalysis    Component Value Date/Time   COLORURINE RED (A) 02/10/2013 1041   APPEARANCEUR CLOUDY (A) 02/10/2013 1041   LABSPEC 1.025 02/10/2013 1041   PHURINE 5.0 02/10/2013 1041   GLUCOSEU NEGATIVE 02/10/2013 1041   HGBUR LARGE (A) 02/10/2013 1041   BILIRUBINUR SMALL (A) 02/10/2013 1041   KETONESUR NEGATIVE 02/10/2013 1041   PROTEINUR 30 (A) 02/10/2013 1041   UROBILINOGEN 0.2 02/10/2013 1041   NITRITE NEGATIVE 02/10/2013 1041   LEUKOCYTESUR MODERATE (A) 02/10/2013 1041   Sepsis Labs Invalid input(s): PROCALCITONIN,  WBC,  LACTICIDVEN Microbiology No results found for this or any previous visit (from the past 240 hour(s)).  Time coordinating discharge: 32 minutes  SIGNED:  Irwin Brakeman, MD  [facility] 03/13/2016, 10:59 AM Pager   If 7PM-7AM, please contact night-coverage [email or web address] Password [email or web address]

## 2016-03-26 ENCOUNTER — Encounter: Payer: Self-pay | Admitting: Nurse Practitioner

## 2016-03-28 ENCOUNTER — Ambulatory Visit (INDEPENDENT_AMBULATORY_CARE_PROVIDER_SITE_OTHER): Payer: BLUE CROSS/BLUE SHIELD | Admitting: Nurse Practitioner

## 2016-03-28 ENCOUNTER — Encounter: Payer: Self-pay | Admitting: Nurse Practitioner

## 2016-03-28 VITALS — BP 106/60 | HR 79 | Ht 61.0 in | Wt 172.0 lb

## 2016-03-28 DIAGNOSIS — I10 Essential (primary) hypertension: Secondary | ICD-10-CM | POA: Diagnosis not present

## 2016-03-28 DIAGNOSIS — I5032 Chronic diastolic (congestive) heart failure: Secondary | ICD-10-CM | POA: Diagnosis not present

## 2016-03-28 DIAGNOSIS — I259 Chronic ischemic heart disease, unspecified: Secondary | ICD-10-CM

## 2016-03-28 LAB — BASIC METABOLIC PANEL
BUN: 19 mg/dL (ref 7–25)
CO2: 26 mmol/L (ref 20–31)
Calcium: 9.1 mg/dL (ref 8.6–10.4)
Chloride: 98 mmol/L (ref 98–110)
Creat: 0.77 mg/dL (ref 0.50–0.99)
Glucose, Bld: 437 mg/dL — ABNORMAL HIGH (ref 65–99)
Potassium: 4.8 mmol/L (ref 3.5–5.3)
Sodium: 135 mmol/L (ref 135–146)

## 2016-03-28 NOTE — Patient Instructions (Addendum)
We will be checking the following labs today - BMET and BNP  Medication Instructions:    Continue with your current medicines.     Testing/Procedures To Be Arranged:  N/A  Follow-Up:   See [name] in 3 to 4 months    Other Special Instructions:   N/A    If you need a refill on your cardiac medications before your next appointment, please call your pharmacy.   Call the [place] office at [phone] if you have any questions, problems or concerns.

## 2016-03-28 NOTE — Progress Notes (Signed)
CARDIOLOGY OFFICE NOTE  Date:  03/28/2016    Amy Shepard Date of Birth: [birth year] Medical Record #[id number]  PCP:  [name], MD  Cardiologist:  [name]  Chief Complaint  Patient presents with  . Follow-up    Post hospital check - seen for [name]    History of Present Illness: Amy Shepard is a 70 y.o. female who presents today for a post hospital visit. Seen for [name].   She has had prior lung cancer, has COPD, depression, DM, and known coronary calcification. Has had cardiac catheterization showing nonobstructive disease with a 40 to 60% LAD - risk factor modification was recommended. She had been released from Dr. [name].   Seen back in July after a 2 year absence - needing surgical clearance for lumbar fusion - EKG was abnormal - Myoview updated and this turned out ok - cleared for surgery.   Post op did ok but presented with shortness of breath and hypoxia on her follow up and got readmitted. This was felt to be multifactorial with COPD exacerbation and diastolic HF. Echo with normal EF. CT negative for PE but with new mediastinal adenopathy - recommend repeat study in 4 to 6 weeks.   Comes in today. Here alone.  She notes that she is doing well. Walking with the walker. No more back pain like she was having prior to her fusion but fell this past _0 95 or 96  [facility] in [place]     bilateral cataracts  . LEFT HEART CATHETERIZATION WITH CORONARY ANGIOGRAM N/A 07/12/2014   Procedure: LEFT HEART CATHETERIZATION WITH CORONARY ANGIOGRAM;  Surgeon: [name], MD;  Location: [facility] CATH LAB;  Service: Cardiovascular;  Laterality: N/A;  . MAXIMUM ACCESS (MAS)POSTERIOR LUMBAR INTERBODY FUSION (PLIF) 2 LEVEL N/A 02/22/2016   Procedure: Lumbar three-four - Lumbar four-five  MAXIMUM ACCESS SURGERY  POSTERIOR LUMBAR INTERBODY FUSION;  Surgeon: [name], MD;  Location: [place] [facility]UR[facility] ORS;  Service: Neurosurgery;  Laterality: N/A;  . THORACOTOMY Right 2010   lower  . TONSILLECTOMY       Medications: Current Outpatient Prescriptions  Medication Sig Dispense Refill  . aspirin EC 81 MG tablet Take 81 mg by mouth every evening.    [name]  celecoxib (CELEBREX) 100 MG capsule Take 1 capsule (100 mg total) by mouth daily as needed for mild pain.  1  . furosemide (LASIX) 20 MG tablet Take 1 tablet (20 mg total) by mouth daily. 30 tablet 0  . gabapentin (NEURONTIN) 300 MG capsule Take 300 mg by mouth 3 (three) times daily.    [name] HYDROcodone-acetaminophen (NORCO/VICODIN) 5-325 MG tablet Take 1 tablet by mouth every 6 (six) hours as needed. (Patient taking differently: Take 1 tablet by mouth every 6 (six) hours as needed for moderate pain. ) 12 tablet 0  . ipratropium-albuterol (DUONEB) 0.5-2.5 (3) MG/3ML SOLN Take 3 mLs by nebulization every 4 (four) hours as needed (wheezing, Shortness of breath). 360 mL 0  . Liraglutide (VICTOZA) 18 MG/3ML SOPN Inject 1.8  mg into the skin daily.    . methocarbamol (ROBAXIN) 500 MG tablet Take 1 tablet (500 mg total) by mouth every 6 (six) hours as needed for muscle spasms. 50 tablet 0  . omeprazole (PRILOSEC OTC) 20 MG tablet Take 1 tablet (20 mg total) by mouth daily. 30 tablet 0  . potassium chloride (K-DUR) 10 MEQ tablet Take 1 tablet (10 mEq total) by mouth daily. 30 tablet 0  . sertraline (ZOLOFT) 100 MG tablet Take 100 mg by mouth daily.    . sitaGLIPtin-metformin (JANUMET) 50-1000 MG per tablet Take 1 tablet by mouth 2 (two) times daily with a meal.    . TURMERIC PO Take 1 capsule by mouth daily.     No current facility-administered medications for this visit.    Facility-Administered Medications Ordered in Other Visits  Medication Dose Route Frequency Provider Last Rate Last Dose  . diatrizoate meglumine-sodium (GASTROGRAFIN) 66-10 % solution 30 mL  30 mL Oral PRN Bjorn Loser, MD   30 mL at 11/30/15 0820    Allergies: Allergies  Allergen Reactions  . Amoxicillin Anaphylaxis, Hives and Rash    Has patient had a PCN reaction causing immediate rash, facial/tongue/throat swelling, SOB or lightheadedness with hypotension: YES Positive reaction causing SEVERE RASH INVOLVING MUCUS MEMBRANES/SKIN NECROSIS: YES Reaction that required HOSPITALIZATION: YES Reaction occurring within the last 10 years: NO    Social History: The patient  reports that she quit smoking about 9 years ago. She has a 30.00 pack-year smoking history. She has never used smokeless tobacco. She reports that she does not drink alcohol or use drugs.   Family History: The patient's family history includes Cancer in her sister; Diabetes in her brother and father; Heart failure in her brother and father.   Review of Systems: Please see the history of present illness.   Otherwise, the review of systems is positive for none.   All other systems are reviewed and negative.   Physical Exam: VS:  BP 106/60   Pulse 79   Ht _0  (1.549  m)   Wt 172 lb (78 kg)   BMI 32.50 kg/m  .  BMI Body mass index is 32.5 kg/m.  Wt Readings from Last 3 Encounters:  03/28/16 172 lb (78 kg)  03/13/16 169 lb 4.8 oz (76.8 kg)  02/22/16 163 lb 1.9 oz (74 kg)    General: Pleasant. She looks chronically ill but is alert and in no acute distress. She actually looks better today to me since I last saw her.   HEENT: Normal.  Neck: Supple, no JVD, carotid bruits, or masses noted.  Cardiac: Regular rate and rhythm. No murmurs, rubs, or gallops. No edema.  Respiratory:  Lungs are clear to  auscultation bilaterally with normal work of breathing.  GI: Soft and nontender.  MS: No deformity or atrophy. Gait and ROM intact. Using a walker.  Skin: Warm and dry. Color is normal.  Neuro:  Strength and sensation are intact and no gross focal deficits noted.  Psych: Alert, appropriate and with normal affect.   LABORATORY DATA:  EKG:  EKG is not ordered today.  Lab Results  Component Value Date   WBC 8.3 03/11/2016   HGB 11.9 (L) 03/11/2016   HCT 38.5 03/11/2016   PLT 263 03/11/2016   GLUCOSE 181 (H) 03/13/2016   ALT 16 11/30/2015   AST 13 11/30/2015   NA 134 (L) 03/13/2016   K 3.6 03/13/2016   CL 94 (L) 03/13/2016   CREATININE 0.81 03/13/2016   BUN 34 (H) 03/13/2016   CO2 30 03/13/2016   INR 1.00 02/01/2016   HGBA1C 9.2 (H) 03/12/2016    BNP (last 3 results)  Recent Labs  03/10/16 1832  BNP 167.6*    ProBNP (last 3 results) No results for input(s): PROBNP in the last 8760 hours.   Other Studies Reviewed Today:  CT CHEST IMPRESSION 03/10/2016: 1. Negative for pulmonary embolus. 2. Congestive heart failure. 3. Scattered areas of collapse/consolidation, possibly due to atelectasis. Difficult to exclude pneumonia. 4. Aortic atherosclerosis and three-vessel coronary artery calcification. 5. Borderline enlarged pulmonary arteries. 6. Mediastinal adenopathy is new from 11/30/2015 and likely due to congestive heart failure.  Metastatic disease cannot be definitively excluded in this patient with a history of lung cancer. Consider follow-up CT chest with contrast in 4-6 weeks, as clinically indicated.   Electronically Signed   By: Lorin Picket M.D.   On: 03/10/2016 18:03  Echo Study Conclusions from 02/2016  - Left ventricle: The cavity size was normal. Wall thickness was   increased in a pattern of mild LVH. Systolic function was normal.   The estimated ejection fraction was in the range of 60% to 65%.   Wall motion was normal; there were no regional wall motion   abnormalities. Features are consistent with a pseudonormal left   ventricular filling pattern, with concomitant abnormal relaxation   and increased filling pressure (grade 2 diastolic dysfunction).   Myoview Study Highlights from 01/2016    Nuclear stress EF: 76%. No wall motion abnormalities  There was no ST segment deviation noted during stress.  Defect 1: There is a small defect of mild severity present in the apex location. No ischemia identified  This is a low risk study.   Candee Furbish, MD       ANGIOGRAPHIC DATA FROM 06/2014: The left main coronary artery is widely patent.  The left anterior descending artery is heavily calcified in the proximal and mid vessel. There is eccentric 40% proximal narrowing. There is 50-60% mid narrowing after the origin of the large diagonal. No hemodynamically significant obstruction is noted in the LAD.  The left circumflex artery is widely patent. There is moderate calcification in the mid and distal vessel. There is 3 obtuse marginal branches. The second marginal is a dominant vessel that contains ectasia proximally but is otherwise normal.  The right coronary arterydominant and widely patent. Luminal irregularities are noted in the mid and distal vessel. [name]   LEFT VENTRICULOGRAM:Left ventricular angiogram was done in the 30 RAO projection and revealed normal LV cavity  size and EF of 60%   IMPRESSIONS:1. Nonobstructive coronary artery disease 2. Three-vessel coronary calcification 3. Normal left ventricular systolic function   RECOMMENDATION:Risk factor  modification.    Assessment/Plan: 1. Post lumbar fusion - complicated by respiratory failure/diastolic heart failure/COPD - doing well. Off antibiotics. Remains on low dose Lasix. Weight fairly stable since discharge. Lab today. Would keep on her current regimen.   2. Known coronary disease - stable Myoview recently.    3. Uncontrolled DM - seeing PCP  4. HLD - no longer on statin - she says due to cost.   5. Abnormal chest CT - PCP is arranging repeat study  Current medicines are reviewed with the patient today.  The patient does not have concerns regarding medicines other than what has been noted above.  The following changes have been made:  See above.  Labs/ tests ordered today include:   No orders of the defined types were placed in this encounter.    Disposition:   FU with [name] in 3 to 4 months.  Patient is agreeable to this plan and will call if any problems develop in the interim.   Signed: Burtis Junes, RN, ANP-C 03/28/2016 2:34 PM  [facility] [address] [place],   [address] Phone: [phone] Fax: [phone]

## 2016-03-29 ENCOUNTER — Telehealth: Payer: Self-pay | Admitting: Physician Assistant

## 2016-03-29 LAB — BRAIN NATRIURETIC PEPTIDE: Brain Natriuretic Peptide: 93.2 pg/mL (ref <100)

## 2016-03-29 NOTE — Telephone Encounter (Signed)
Received call from [facility]. Glucose on labs yesterday critical at 437. Called patient and daughter (DPR on file) and left message to call back. Richardson Dopp, PA-C   03/29/2016 2:20 PM

## 2016-03-29 NOTE — Telephone Encounter (Signed)
I spoke with her daughter and advised her to recheck her sugar today. She apparently has short acting insulin and should take this accordingly. She should call her PCP if glucose > 400. Otherwise, arrange early FU this week for diabetes. Richardson Dopp, PA-C   03/29/2016 2:51 PM

## 2016-04-25 ENCOUNTER — Other Ambulatory Visit: Payer: Self-pay | Admitting: Family Medicine

## 2016-04-25 DIAGNOSIS — R591 Generalized enlarged lymph nodes: Secondary | ICD-10-CM

## 2016-04-25 DIAGNOSIS — R599 Enlarged lymph nodes, unspecified: Secondary | ICD-10-CM

## 2016-04-29 NOTE — Progress Notes (Signed)
Surgery on 05/20/2016.  Need orders in EPIc.  Thank You.

## 2016-05-01 ENCOUNTER — Ambulatory Visit
Admission: RE | Admit: 2016-05-01 | Discharge: 2016-05-01 | Disposition: A | Discharge status: Home / Self Care | Payer: BLUE CROSS/BLUE SHIELD | Source: Ambulatory Visit | Attending: Family Medicine | Admitting: Family Medicine

## 2016-05-01 DIAGNOSIS — R591 Generalized enlarged lymph nodes: Secondary | ICD-10-CM

## 2016-05-01 DIAGNOSIS — R599 Enlarged lymph nodes, unspecified: Secondary | ICD-10-CM

## 2016-05-01 MED ORDER — IOPAMIDOL (ISOVUE-300) INJECTION 61%
80.0000 mL | Freq: Once | INTRAVENOUS | Status: AC | PRN
  Administered 2016-05-01: 80 mL via INTRAVENOUS

## 2016-05-05 ENCOUNTER — Telehealth: Payer: Self-pay | Admitting: Nurse Practitioner

## 2016-05-05 MED ORDER — FUROSEMIDE 20 MG PO TABS: 20.0000 mg | ORAL_TABLET | Freq: Every day | ORAL | 0 refills | Status: DC

## 2016-05-05 NOTE — Telephone Encounter (Signed)
New Message  Pt call requesting to speak with RN. Pt states she is having some one the same symptoms she was experiencing while being see on 9/15. Pt states some sob on exertion. Pt would like to speak with RN to see if she would need to get a refill on lasix medication. Pt states she has completed taking the antibiotic and thinks the sob has returned because she is complete. Please call back to discuss

## 2016-05-05 NOTE — H&P (Signed)
TOTAL KNEE ADMISSION H&P  Patient is being admitted for left total knee arthroplasty.  Subjective:  Chief Complaint:  Left knee primary OA / pain  HPI: Amy Shepard, 70 y.o. female, has a history of pain and functional disability in the left knee due to arthritis and has failed non-surgical conservative treatments for greater than 12 weeks to include NSAID's and/or analgesics and activity modification.  Onset of symptoms was gradual, starting ~5 years ago with gradually worsening course since that time. The patient noted no past surgery on the left knee(s).  Patient currently rates pain in the left knee(s) at 3 out of 10 with activity, more complaints of instability. Patient has worsening of pain with activity and weight bearing, pain that interferes with activities of daily living, pain with passive range of motion, crepitus and joint swelling.  Patient has evidence of periarticular osteophytes and joint space narrowing by imaging studies.  There is no active infection.  Risks, benefits and expectations were discussed with the patient.  Risks including but not limited to the risk of anesthesia, blood clots, nerve damage, blood vessel damage, failure of the prosthesis, infection and up to and including death.  Patient understand the risks, benefits and expectations and wishes to proceed with surgery.   PCP: [name], MD  D/C Plans:      SNF  Post-op Meds:       No Rx given   Tranexamic Acid:      To be given - IV    Decadron:      Is to be given  FYI:     ASA  Norco    Patient Active Problem List   Diagnosis Date Noted  . Diabetes mellitus type 2 in obese ([place]) 03/11/2016  . Depression 03/11/2016  . COPD (chronic obstructive pulmonary disease) ([place]) 03/10/2016  . COPD exacerbation ([place]) 03/10/2016  . S/P lumbar spinal fusion 02/22/2016  . Coronary artery disease involving native coronary artery 07/12/2014  . Pain in the chest   . Malignant neoplasm of lower lobe of right  lung ([place]) 05/25/2014  . Lung cancer ([place]) 09/01/2012   Past Medical History:  Diagnosis Date  . Cancer ([place])    lung ca  . COPD (chronic obstructive pulmonary disease) ([place])   . Depression   . Diabetes mellitus    [name]    dx  2008  . Hypercholesteremia   . Hypertension    on no medications  . Lung cancer ([place])   . Pneumonia    2008  @  [name]  . Shortness of breath dyspnea    doe    Past Surgical History:  Procedure Laterality Date  . [place] [facility] in [place]     bilateral cataracts  . LEFT HEART CATHETERIZATION WITH CORONARY ANGIOGRAM N/A 07/12/2014   Procedure: LEFT HEART CATHETERIZATION WITH CORONARY ANGIOGRAM;  Surgeon: [name], MD;  Location: [facility] CATH LAB;  Service: Cardiovascular;  Laterality: N/A;  . MAXIMUM ACCESS (MAS)POSTERIOR LUMBAR INTERBODY FUSION (PLIF) 2 LEVEL N/A 02/22/2016   Procedure: Lumbar three-four - Lumbar four-five  MAXIMUM ACCESS SURGERY  POSTERIOR LUMBAR INTERBODY FUSION;  Surgeon: [name], MD;  Location: [place] [facility]URO ORS;  Service: Neurosurgery;  Laterality: N/A;  . THORACOTOMY Right 2010   lower  . TONSILLECTOMY      No prescriptions prior to admission.   Allergies  Allergen Reactions  . Amoxicillin  Anaphylaxis, Hives and Rash    Has patient had a PCN reaction causing immediate rash, facial/tongue/throat swelling, SOB or lightheadedness with hypotension: YES Positive reaction causing SEVERE RASH INVOLVING MUCUS MEMBRANES/SKIN NECROSIS: YES Reaction that required HOSPITALIZATION: YES Reaction occurring within the last 10 years: NO    Social History  Substance Use Topics  . Smoking status: Former Smoker    Packs/day: 1.00    Years: 30.00    Quit date: 07/14/2006  . Smokeless tobacco: Never Used  . Alcohol use No    Family History  Problem Relation Age of Onset  . Diabetes Father   . Heart failure Father   . Cancer Sister   . Diabetes Brother   . Heart  failure Brother      Review of Systems  Constitutional: Negative.   HENT: Negative.   Eyes: Negative.   Respiratory: Positive for shortness of breath (on exertion).   Cardiovascular: Negative.   Gastrointestinal: Negative.   Genitourinary: Positive for frequency and urgency.  Musculoskeletal: Positive for joint pain.  Skin: Negative.   Neurological: Negative.   Endo/Heme/Allergies: Negative.   Psychiatric/Behavioral: Negative.     Objective:  Physical Exam  Constitutional: She is oriented to person, place, and time. She appears well-developed.  HENT:  Head: Normocephalic.  Eyes: Pupils are equal, round, and reactive to light.  Neck: Neck supple. No JVD present. No tracheal deviation present. No thyromegaly present.  Cardiovascular: Normal rate, regular rhythm, normal heart sounds and intact distal pulses.   Respiratory: Effort normal and breath sounds normal. No respiratory distress. She has no wheezes.  GI: Soft. There is no tenderness. There is no guarding.  Musculoskeletal:       Left knee: She exhibits decreased range of motion, swelling, abnormal alignment and bony tenderness. She exhibits no ecchymosis, no deformity, no laceration and no erythema. Tenderness found.  Lymphadenopathy:    She has no cervical adenopathy.  Neurological: She is alert and oriented to person, place, and time. A sensory deficit (bilateral LE DM neuropathy) is present.  Skin: Skin is warm and dry.  Psychiatric: She has a normal mood and affect.      Labs:  Estimated body mass index is 32.5 kg/m as calculated from the following:   Height as of 03/28/16: 5' 1" (1.549 m).   Weight as of 03/28/16: 78 kg (172 lb).   Imaging Review Plain radiographs demonstrate severe degenerative joint disease of the left knee(s).  The bone quality appears to be good for age and reported activity level.  Assessment/Plan:  End stage arthritis, left knee   The patient history, physical examination, clinical  judgment of the provider and imaging studies are consistent with end stage degenerative joint disease of the left knee(s) and total knee arthroplasty is deemed medically necessary. The treatment options including medical management, injection therapy arthroscopy and arthroplasty were discussed at length. The risks and benefits of total knee arthroplasty were presented and reviewed. The risks due to aseptic loosening, infection, stiffness, patella tracking problems, thromboembolic complications and other imponderables were discussed. The patient acknowledged the explanation, agreed to proceed with the plan and consent was signed. Patient is being admitted for inpatient treatment for surgery, pain control, PT, OT, prophylactic antibiotics, VTE prophylaxis, progressive ambulation and ADL's and discharge planning. The patient is planning to be discharged to skilled nursing facility.     [place] Laurelai Lepp   PA-C  05/05/2016, 9:06 PM

## 2016-05-05 NOTE — Telephone Encounter (Signed)
S/w pt is having exertional chest pain the same pain pt was having when pt got admitted.  Hurts in the clavicle are.  Stated started one day last week.  Wants another antibiotic, pt has some hoarseness and is coughing up sometimes clear, yellow or green sputum.  Pt does have plain mucinex, but has to find it.  Will start taking tonight. Has hand swelling but ate can soup the other day. Weight is up some from last week, 4 lbs.  174 last  week  This week 178.  Pt is very fatigued and has checked bp today 106/63.  Stated also needs refill on Lasix ( 20 mg ) daily and 90 day supply. Will send in to pt's requested pharmacy.  Stated if chest pain gets worse go to ER or call 911.  Stated will send to Cecille Rubin to advise and call pt back tomorrow.  Pt is agreeable to treatment plan.

## 2016-05-06 NOTE — Telephone Encounter (Signed)
Needs to see her PCP.

## 2016-05-06 NOTE — Telephone Encounter (Signed)
Phone is not accepting calls with try later.

## 2016-05-12 NOTE — Progress Notes (Signed)
EKG-03/11/16- EPIC, ECHO- 03/12/16- EPIC, Stress Test- 02/07/16- EPIC  CXR- 03/10/16- EPIC

## 2016-05-12 NOTE — Patient Instructions (Signed)
Amy Shepard  05/12/2016   Your procedure is scheduled on: 05/20/2016    Report to [facility] Main  Entrance take [facility]  elevators to 3rd floor to  [place] at      1000 AM.  Call this number if you have problems the morning of surgery [phone]   Remember: ONLY 1 PERSON MAY GO WITH YOU TO SHORT STAY TO GET  READY MORNING OF [place].  Do not eat food or drink liquids :After Midnight.     Take these medicines the morning of surgery with A SIP OF WATER: Zoloft, Duoneb if needed, Hydrocodone if needed, Prilosec  DO NOT TAKE ANY DIABETIC MEDICATIONS DAY OF YOUR SURGERY                               You may not have any metal on your body including hair pins and              piercings  Do not wear jewelry, make-up, lotions, powders or perfumes, deodorant             Do not wear nail polish.  Do not shave  48 hours prior to surgery.               Do not bring valuables to the hospital. [place].  Contacts, dentures or bridgework may not be worn into surgery.  Leave suitcase in the car. After surgery it may be brought to your room.         Special Instructions: coughing and deep breathing exercises, leg exercises               Please read over the following fact sheets you were given: _____________________________________________________________________             [facility] - Preparing for Surgery Before surgery, you can play an important role.  Because skin is not sterile, your skin needs to be as free of germs as possible.  You can reduce the number of germs on your skin by washing with CHG (chlorahexidine gluconate) soap before surgery.  CHG is an antiseptic cleaner which kills germs and bonds with the skin to continue killing germs even after washing. Please DO NOT use if you have an allergy to CHG or antibacterial soaps.  If your skin becomes reddened/irritated stop using the CHG  and inform your nurse when you arrive at Short Stay. Do not shave (including legs and underarms) for at least 48 hours prior to the first CHG shower.  You may shave your face/neck. Please follow these instructions carefully:  1.  Shower with CHG Soap the night before surgery and the  morning of Surgery.  2.  If you choose to wash your hair, wash your hair first as usual with your  normal  shampoo.  3.  After you shampoo, rinse your hair and body thoroughly to remove the  shampoo.                           4.  Use CHG as you would any other liquid soap.  You can apply chg directly  to the skin and wash  Gently with a scrungie or clean washcloth.  5.  Apply the CHG Soap to your body ONLY FROM THE NECK DOWN.   Do not use on face/ open                           Wound or open sores. Avoid contact with eyes, ears mouth and genitals (private parts).                       Wash face,  Genitals (private parts) with your normal soap.             6.  Wash thoroughly, paying special attention to the area where your surgery  will be performed.  7.  Thoroughly rinse your body with warm water from the neck down.  8.  DO NOT shower/wash with your normal soap after using and rinsing off  the CHG Soap.                9.  Pat yourself dry with a clean towel.            10.  Wear clean pajamas.            11.  Place clean sheets on your bed the night of your first shower and do not  sleep with pets. Day of Surgery : Do not apply any lotions/deodorants the morning of surgery.  Please wear clean clothes to the hospital/surgery center.  FAILURE TO FOLLOW THESE INSTRUCTIONS MAY RESULT IN THE CANCELLATION OF YOUR SURGERY PATIENT SIGNATURE_________________________________  NURSE SIGNATURE__________________________________  ________________________________________________________________________ How to Manage Your Diabetes Before and After Surgery  Why is it important to control my blood sugar  before and after surgery? . Improving blood sugar levels before and after surgery helps healing and can limit problems. . A way of improving blood sugar control is eating a healthy diet by: o  Eating less sugar and carbohydrates o  Increasing activity/exercise o  Talking with your doctor about reaching your blood sugar goals . High blood sugars (greater than 180 mg/dL) can raise your risk of infections and slow your recovery, so you will need to focus on controlling your diabetes during the weeks before surgery. . Make sure that the doctor who takes care of your diabetes knows about your planned surgery including the date and location.  How do I manage my blood sugar before surgery? . Check your blood sugar at least 4 times a day, starting 2 days before surgery, to make sure that the level is not too high or low. o Check your blood sugar the morning of your surgery when you wake up and every 2 hours until you get to the Short Stay unit. . If your blood sugar is less than 70 mg/dL, you will need to treat for low blood sugar: o Do not take insulin. o Treat a low blood sugar (less than 70 mg/dL) with  cup of clear juice (cranberry or apple), 4 glucose tablets, OR glucose gel. o Recheck blood sugar in 15 minutes after treatment (to make sure it is greater than 70 mg/dL). If your blood sugar is not greater than 70 mg/dL on recheck, call [phone] for further instructions. . Report your blood sugar to the short stay nurse when you get to Short Stay.  . If you are admitted to the hospital after surgery: o Your blood sugar will be checked by the staff and you will probably be  given insulin after surgery (instead of oral diabetes medicines) to make sure you have good blood sugar levels. o The goal for blood sugar control after surgery is 80-180 mg/dL.   WHAT DO I DO ABOUT MY DIABETES MEDICATION?  [name] Do not take oral diabetes medicines (pills) the morning of surgery.  .       .   . The day  of surgery, do not take other diabetes injectables, including Byetta (exenatide), Bydureon (exenatide ER), Victoza (liraglutide), or Trulicity (dulaglutide).  . If your CBG is greater than 220 mg/dL, you may take  of your sliding scale  . (correction) dose of insulin.  Patient Signature:  Date:   Nurse Signature:  Date:   Reviewed and Endorsed by [facility] Patient Education Committee, August 2015 WHAT IS A BLOOD TRANSFUSION? Blood Transfusion Information  A transfusion is the replacement of blood or some of its parts. Blood is made up of multiple cells which provide different functions.  Red blood cells carry oxygen and are used for blood loss replacement.  White blood cells fight against infection.  Platelets control bleeding.  Plasma helps clot blood.  Other blood products are available for specialized needs, such as hemophilia or other clotting disorders. BEFORE THE TRANSFUSION  Who gives blood for transfusions?   Healthy volunteers who are fully evaluated to make sure their blood is safe. This is blood bank blood. Transfusion therapy is the safest it has ever been in the practice of medicine. Before blood is taken from a donor, a complete history is taken to make sure that person has no history of diseases nor engages in risky social behavior (examples are intravenous drug use or sexual activity with multiple partners). The donor's travel history is screened to minimize risk of transmitting infections, such as malaria. The donated blood is tested for signs of infectious diseases, such as HIV and hepatitis. The blood is then tested to be sure it is compatible with you in order to minimize the chance of a transfusion reaction. If you or a relative donates blood, this is often done in anticipation of surgery and is not appropriate for emergency situations. It takes many days to process the donated blood. RISKS AND COMPLICATIONS Although transfusion therapy is very safe and saves many  lives, the main dangers of transfusion include:   Getting an infectious disease.  Developing a transfusion reaction. This is an allergic reaction to something in the blood you were given. Every precaution is taken to prevent this. The decision to have a blood transfusion has been considered carefully by your caregiver before blood is given. Blood is not given unless the benefits outweigh the risks. AFTER THE TRANSFUSION  Right after receiving a blood transfusion, you will usually feel much better and more energetic. This is especially true if your red blood cells have gotten low (anemic). The transfusion raises the level of the red blood cells which carry oxygen, and this usually causes an energy increase.  The nurse administering the transfusion will monitor you carefully for complications. HOME CARE INSTRUCTIONS  No special instructions are needed after a transfusion. You may find your energy is better. Speak with your caregiver about any limitations on activity for underlying diseases you may have. SEEK MEDICAL CARE IF:   Your condition is not improving after your transfusion.  You develop redness or irritation at the intravenous (IV) site. SEEK IMMEDIATE MEDICAL CARE IF:  Any of the following symptoms occur over the next 12 hours:  Shaking chills.  You have a temperature by mouth above 102 F (38.9 C), not controlled by medicine.  Chest, back, or muscle pain.  People around you feel you are not acting correctly or are confused.  Shortness of breath or difficulty breathing.  Dizziness and fainting.  You get a rash or develop hives.  You have a decrease in urine output.  Your urine turns a dark color or changes to pink, red, or brown. Any of the following symptoms occur over the next 10 days:  You have a temperature by mouth above 102 F (38.9 C), not controlled by medicine.  Shortness of breath.  Weakness after normal activity.  The white part of the eye turns  yellow (jaundice).  You have a decrease in the amount of urine or are urinating less often.  Your urine turns a dark color or changes to pink, red, or brown. Document Released: 06/27/2000 Document Revised: 09/22/2011 Document Reviewed: 02/14/2008 ExitCare Patient Information 2014 [place].  _______________________________________________________________________  Incentive Spirometer  An incentive spirometer is a tool that can help keep your lungs clear and active. This tool measures how well you are filling your lungs with each breath. Taking long deep breaths may help reverse or decrease the chance of developing breathing (pulmonary) problems (especially infection) following:  A long period of time when you are unable to move or be active. BEFORE THE PROCEDURE   If the spirometer includes an indicator to show your best effort, your nurse or respiratory therapist will set it to a desired goal.  If possible, sit up straight or lean slightly forward. Try not to slouch.  Hold the incentive spirometer in an upright position. INSTRUCTIONS FOR USE  1. Sit on the edge of your bed if possible, or sit up as far as you can in bed or on a chair. 2. Hold the incentive spirometer in an upright position. 3. Breathe out normally. 4. Place the mouthpiece in your mouth and seal your lips tightly around it. 5. Breathe in slowly and as deeply as possible, raising the piston or the ball toward the top of the column. 6. Hold your breath for 3-5 seconds or for as long as possible. Allow the piston or ball to fall to the bottom of the column. 7. Remove the mouthpiece from your mouth and breathe out normally. 8. Rest for a few seconds and repeat Steps 1 through 7 at least 10 times every 1-2 hours when you are awake. Take your time and take a few normal breaths between deep breaths. 9. The spirometer may include an indicator to show your best effort. Use the indicator as a goal to work toward during  each repetition. 10. After each set of 10 deep breaths, practice coughing to be sure your lungs are clear. If you have an incision (the cut made at the time of surgery), support your incision when coughing by placing a pillow or rolled up towels firmly against it. Once you are able to get out of bed, walk around indoors and cough well. You may stop using the incentive spirometer when instructed by your caregiver.  RISKS AND COMPLICATIONS  Take your time so you do not get dizzy or light-headed.  If you are in pain, you may need to take or ask for pain medication before doing incentive spirometry. It is harder to take a deep breath if you are having pain. AFTER USE  Rest and breathe slowly and easily.  It can be helpful to keep track of a log  of your progress. Your caregiver can provide you with a simple table to help with this. If you are using the spirometer at home, follow these instructions: [place] IF:   You are having difficultly using the spirometer.  You have trouble using the spirometer as often as instructed.  Your pain medication is not giving enough relief while using the spirometer.  You develop fever of 100.5 F (38.1 C) or higher. SEEK IMMEDIATE MEDICAL CARE IF:   You cough up bloody sputum that had not been present before.  You develop fever of 102 F (38.9 C) or greater.  You develop worsening pain at or near the incision site. MAKE SURE YOU:   Understand these instructions.  Will watch your condition.  Will get help right away if you are not doing well or get worse. Document Released: 11/10/2006 Document Revised: 09/22/2011 Document Reviewed: 01/11/2007 [facility] Patient Information 2014 [place], [place].   ________________________________________________________________________

## 2016-05-13 ENCOUNTER — Inpatient Hospital Stay (HOSPITAL_COMMUNITY)
Admission: RE | Admit: 2016-05-13 | Discharge: 2016-05-13 | Disposition: A | Discharge status: Home / Self Care | Payer: BLUE CROSS/BLUE SHIELD | Source: Ambulatory Visit

## 2016-05-15 ENCOUNTER — Encounter (HOSPITAL_COMMUNITY)
Admission: RE | Admit: 2016-05-15 | Discharge: 2016-05-15 | Disposition: A | Discharge status: Home / Self Care | Payer: BLUE CROSS/BLUE SHIELD | Source: Ambulatory Visit | Attending: Orthopedic Surgery | Admitting: Orthopedic Surgery

## 2016-05-15 ENCOUNTER — Encounter (HOSPITAL_COMMUNITY): Payer: Self-pay

## 2016-05-15 ENCOUNTER — Ambulatory Visit (HOSPITAL_COMMUNITY)
Admission: RE | Admit: 2016-05-15 | Discharge: 2016-05-15 | Disposition: A | Discharge status: Home / Self Care | Payer: BLUE CROSS/BLUE SHIELD | Source: Ambulatory Visit | Attending: Orthopedic Surgery | Admitting: Orthopedic Surgery

## 2016-05-15 DIAGNOSIS — J441 Chronic obstructive pulmonary disease with (acute) exacerbation: Secondary | ICD-10-CM | POA: Insufficient documentation

## 2016-05-15 DIAGNOSIS — I509 Heart failure, unspecified: Secondary | ICD-10-CM | POA: Insufficient documentation

## 2016-05-15 DIAGNOSIS — Z01812 Encounter for preprocedural laboratory examination: Secondary | ICD-10-CM | POA: Diagnosis present

## 2016-05-15 DIAGNOSIS — Z01818 Encounter for other preprocedural examination: Secondary | ICD-10-CM | POA: Diagnosis present

## 2016-05-15 DIAGNOSIS — Z0189 Encounter for other specified special examinations: Secondary | ICD-10-CM

## 2016-05-15 DIAGNOSIS — C3431 Malignant neoplasm of lower lobe, right bronchus or lung: Secondary | ICD-10-CM | POA: Diagnosis not present

## 2016-05-15 DIAGNOSIS — F329 Major depressive disorder, single episode, unspecified: Secondary | ICD-10-CM | POA: Diagnosis not present

## 2016-05-15 DIAGNOSIS — Z981 Arthrodesis status: Secondary | ICD-10-CM | POA: Insufficient documentation

## 2016-05-15 DIAGNOSIS — I700 Atherosclerosis of aorta: Secondary | ICD-10-CM | POA: Insufficient documentation

## 2016-05-15 DIAGNOSIS — M1990 Unspecified osteoarthritis, unspecified site: Secondary | ICD-10-CM | POA: Diagnosis not present

## 2016-05-15 DIAGNOSIS — R079 Chest pain, unspecified: Secondary | ICD-10-CM | POA: Diagnosis not present

## 2016-05-15 DIAGNOSIS — E1169 Type 2 diabetes mellitus with other specified complication: Secondary | ICD-10-CM | POA: Insufficient documentation

## 2016-05-15 HISTORY — DX: Allergy status to unspecified drugs, medicaments and biological substances: Z88.9

## 2016-05-15 HISTORY — DX: Unspecified osteoarthritis, unspecified site: M19.90

## 2016-05-15 LAB — SURGICAL PCR SCREEN
MRSA, PCR: NEGATIVE
Staphylococcus aureus: NEGATIVE

## 2016-05-15 LAB — CBC
HCT: 41 % (ref 36.0–46.0)
Hemoglobin: 13.2 g/dL (ref 12.0–15.0)
MCH: 27.8 pg (ref 26.0–34.0)
MCHC: 32.2 g/dL (ref 30.0–36.0)
MCV: 86.5 fL (ref 78.0–100.0)
PLATELETS: 217 10*3/uL (ref 150–400)
RBC: 4.74 MIL/uL (ref 3.87–5.11)
RDW: 14.8 % (ref 11.5–15.5)
WBC: 7.6 10*3/uL (ref 4.0–10.5)

## 2016-05-15 LAB — BASIC METABOLIC PANEL
ANION GAP: 11 (ref 5–15)
BUN: 23 mg/dL — AB (ref 6–20)
CALCIUM: 10 mg/dL (ref 8.9–10.3)
CO2: 28 mmol/L (ref 22–32)
CREATININE: 0.99 mg/dL (ref 0.44–1.00)
Chloride: 102 mmol/L (ref 101–111)
GFR calc Af Amer: >60 mL/min (ref >60)
GFR, EST NON AFRICAN AMERICAN: 57 mL/min — AB (ref >60)
GLUCOSE: 206 mg/dL — AB (ref 65–99)
Potassium: 5.4 mmol/L — ABNORMAL HIGH (ref 3.5–5.1)
Sodium: 141 mmol/L (ref 135–145)

## 2016-05-15 LAB — GLUCOSE, CAPILLARY: Glucose-Capillary: 187 mg/dL — ABNORMAL HIGH (ref 65–99)

## 2016-05-15 NOTE — Patient Instructions (Addendum)
Amy Shepard  05/15/2016   Your procedure is scheduled on:   Report to [facility] Main  Entrance take [facility]  elevators to 3rd floor to  [place] at AM.  Call this number if you have problems the morning of surgery [phone]   Remember: ONLY 1 PERSON MAY GO WITH YOU TO SHORT STAY TO GET  READY MORNING OF [place].  Do not eat food or drink liquids :After Midnight.     Take these medicines the morning of surgery with A SIP OF WATER: Zoloft, Gabapentin,---Use Duoneb May take Norco if needed DO NOT TAKE ANY DIABETIC MEDICATIONS DAY OF YOUR SURGERY                               You may not have any metal on your body including hair pins and              piercings  Do not wear jewelry, make-up, lotions, powders or perfumes, deodorant             Do not wear nail polish.  Do not shave  48 hours prior to surgery.              Men may shave face and neck.   Do not bring valuables to the hospital. [place].  Contacts, dentures or bridgework may not be worn into surgery.  Leave suitcase in the car. After surgery it may be brought to your room.                 Please read over the following fact sheets you were given: _____________________________________________________________________             How to Manage Your Diabetes Before and After Surgery  Why is it important to control my blood sugar before and after surgery? . Improving blood sugar levels before and after surgery helps healing and can limit problems. . A way of improving blood sugar control is eating a healthy diet by: o  Eating less sugar and carbohydrates o  Increasing activity/exercise o  Talking with your doctor about reaching your blood sugar goals . High blood sugars (greater than 180 mg/dL) can raise your risk of infections and slow your recovery, so you will need to focus on controlling your diabetes during the weeks  before surgery. . Make sure that the doctor who takes care of your diabetes knows about your planned surgery including the date and location.  How do I manage my blood sugar before surgery? . Check your blood sugar at least 4 times a day, starting 2 days before surgery, to make sure that the level is not too high or low. o Check your blood sugar the morning of your surgery when you wake up and every 2 hours until you get to the Short Stay unit. . If your blood sugar is less than 70 mg/dL, you will need to treat for low blood sugar: o Do not take insulin. o Treat a low blood sugar (less than 70 mg/dL) with  cup of clear juice (cranberry or apple), 4 glucose tablets, OR glucose gel. o Recheck blood sugar in 15 minutes after treatment (to make sure it is greater than 70 mg/dL). If your  blood sugar is not greater than 70 mg/dL on recheck, call [phone] for further instructions. . Report your blood sugar to the short stay nurse when you get to Short Stay.  . If you are admitted to the hospital after surgery: o Your blood sugar will be checked by the staff and you will probably be given insulin after surgery (instead of oral diabetes medicines) to make sure you have good blood sugar levels. o The goal for blood sugar control after surgery is 80-180 mg/dL.   WHAT DO I DO ABOUT MY DIABETES MEDICATION?  [name] Do not take oral diabetes medicines (pills) the morning of surgery.  . THE NIGHT BEFORE SURGERY, take ___________ units of ___________insulin.        . The day of surgery, do not take other diabetes injectables, including Byetta (exenatide), Bydureon (exenatide ER), Victoza (liraglutide), or Trulicity (dulaglutide).  Patient Signature:  Date:   Nurse Signature:  Date:   Reviewed and Endorsed by [facility] Patient Education Committee, August 2015                           Please read over the following fact sheets you were  given: _____________________________________________________________________  [facility] - Preparing for Surgery Before surgery, you can play an important role.  Because skin is not sterile, your skin needs to be as free of germs as possible.  You can reduce the number of germs on your skin by washing with CHG (chlorahexidine gluconate) soap before surgery.  CHG is an antiseptic cleaner which kills germs and bonds with the skin to continue killing germs even after washing. Please DO NOT use if you have an allergy to CHG or antibacterial soaps.  If your skin becomes reddened/irritated stop using the CHG and inform your nurse when you arrive at Short Stay. Do not shave (including legs and underarms) for at least 48 hours prior to the first CHG shower.  You may shave your face/neck. Please follow these instructions carefully:  1.  Shower with CHG Soap the night before surgery and the  morning of Surgery.  2.  If you choose to wash your hair, wash your hair first as usual with your  normal  shampoo.  3.  After you shampoo, rinse your hair and body thoroughly to remove the  shampoo.                           4.  Use CHG as you would any other liquid soap.  You can apply chg directly  to the skin and wash                       Gently with a scrungie or clean washcloth.  5.  Apply the CHG Soap to your body ONLY FROM THE NECK DOWN.   Do not use on face/ open                           Wound or open sores. Avoid contact with eyes, ears mouth and genitals (private parts).                       Wash face,  Genitals (private parts) with your normal soap.             6.  Wash thoroughly, paying special attention to the area where  your surgery  will be performed.  7.  Thoroughly rinse your body with warm water from the neck down.  8.  DO NOT shower/wash with your normal soap after using and rinsing off  the CHG Soap.                9.  Pat yourself dry with a clean towel.            10.  Wear clean pajamas.             11.  Place clean sheets on your bed the night of your first shower and do not  sleep with pets. Day of Surgery : Do not apply any lotions/deodorants the morning of surgery.  Please wear clean clothes to the hospital/surgery center.  FAILURE TO FOLLOW THESE INSTRUCTIONS MAY RESULT IN THE CANCELLATION OF YOUR SURGERY PATIENT SIGNATURE_________________________________  NURSE SIGNATURE__________________________________  ________________________________________________________________________              Adam Phenix  An incentive spirometer is a tool that can help keep your lungs clear and active. This tool measures how well you are filling your lungs with each breath. Taking long deep breaths may help reverse or decrease the chance of developing breathing (pulmonary) problems (especially infection) following:  A long period of time when you are unable to move or be active. BEFORE THE PROCEDURE   If the spirometer includes an indicator to show your best effort, your nurse or respiratory therapist will set it to a desired goal.  If possible, sit up straight or lean slightly forward. Try not to slouch.  Hold the incentive spirometer in an upright position. INSTRUCTIONS FOR USE  1. Sit on the edge of your bed if possible, or sit up as far as you can in bed or on a chair. 2. Hold the incentive spirometer in an upright position. 3. Breathe out normally. 4. Place the mouthpiece in your mouth and seal your lips tightly around it. 5. Breathe in slowly and as deeply as possible, raising the piston or the ball toward the top of the column. 6. Hold your breath for 3-5 seconds or for as long as possible. Allow the piston or ball to fall to the bottom of the column. 7. Remove the mouthpiece from your mouth and breathe out normally. 8. Rest for a few seconds and repeat Steps 1 through 7 at least 10 times every 1-2 hours when you are awake. Take your time and take a few normal  breaths between deep breaths. 9. The spirometer may include an indicator to show your best effort. Use the indicator as a goal to work toward during each repetition. 10. After each set of 10 deep breaths, practice coughing to be sure your lungs are clear. If you have an incision (the cut made at the time of surgery), support your incision when coughing by placing a pillow or rolled up towels firmly against it. Once you are able to get out of bed, walk around indoors and cough well. You may stop using the incentive spirometer when instructed by your caregiver.  RISKS AND COMPLICATIONS  Take your time so you do not get dizzy or light-headed.  If you are in pain, you may need to take or ask for pain medication before doing incentive spirometry. It is harder to take a deep breath if you are having pain. AFTER USE  Rest and breathe slowly and easily.  It can be helpful to keep track of a log of  your progress. Your caregiver can provide you with a simple table to help with this. If you are using the spirometer at home, follow these instructions: [place] IF:   You are having difficultly using the spirometer.  You have trouble using the spirometer as often as instructed.  Your pain medication is not giving enough relief while using the spirometer.  You develop fever of 100.5 F (38.1 C) or higher. SEEK IMMEDIATE MEDICAL CARE IF:   You cough up bloody sputum that had not been present before.  You develop fever of 102 F (38.9 C) or greater.  You develop worsening pain at or near the incision site. MAKE SURE YOU:   Understand these instructions.  Will watch your condition.  Will get help right away if you are not doing well or get worse. Document Released: 11/10/2006 Document Revised: 09/22/2011 Document Reviewed: 01/11/2007 [employer] Patient Information 2014 [employer], [place].   ________________________________________________________________________    CLEAR LIQUID  DIET   Foods Allowed                                                                     Foods Excluded  Coffee and tea, regular and decaf                             liquids that you cannot  Plain Jell-O in any flavor                                             see through such as: Fruit ices (not with fruit pulp)                                     milk, soups, orange juice  Iced Popsicles                                    All solid food Carbonated beverages, regular and diet                                    Cranberry, grape and apple juices Sports drinks like Gatorade Lightly seasoned clear broth or consume(fat free) Sugar, honey syrup  Sample Menu Breakfast                                Lunch                                     Supper Cranberry juice                    Beef broth  Chicken broth Jell-O                                     Grape juice                           Apple juice Coffee or tea                        Jell-O                                      Popsicle                                                Coffee or tea                        Coffee or tea  _____________________________________________________________________   WHAT IS A BLOOD TRANSFUSION? Blood Transfusion Information  A transfusion is the replacement of blood or some of its parts. Blood is made up of multiple cells which provide different functions.  Red blood cells carry oxygen and are used for blood loss replacement.  White blood cells fight against infection.  Platelets control bleeding.  Plasma helps clot blood.  Other blood products are available for specialized needs, such as hemophilia or other clotting disorders. BEFORE THE TRANSFUSION  Who gives blood for transfusions?   Healthy volunteers who are fully evaluated to make sure their blood is safe. This is blood bank blood. Transfusion therapy is the safest it has ever been in the practice of medicine.  Before blood is taken from a donor, a complete history is taken to make sure that person has no history of diseases nor engages in risky social behavior (examples are intravenous drug use or sexual activity with multiple partners). The donor's travel history is screened to minimize risk of transmitting infections, such as malaria. The donated blood is tested for signs of infectious diseases, such as HIV and hepatitis. The blood is then tested to be sure it is compatible with you in order to minimize the chance of a transfusion reaction. If you or a relative donates blood, this is often done in anticipation of surgery and is not appropriate for emergency situations. It takes many days to process the donated blood. RISKS AND COMPLICATIONS Although transfusion therapy is very safe and saves many lives, the main dangers of transfusion include:   Getting an infectious disease.  Developing a transfusion reaction. This is an allergic reaction to something in the blood you were given. Every precaution is taken to prevent this. The decision to have a blood transfusion has been considered carefully by your caregiver before blood is given. Blood is not given unless the benefits outweigh the risks. AFTER THE TRANSFUSION  Right after receiving a blood transfusion, you will usually feel much better and more energetic. This is especially true if your red blood cells have gotten low (anemic). The transfusion raises the level of the red blood cells which carry oxygen, and this usually causes an energy increase.  The nurse administering the transfusion will monitor you carefully for complications.  HOME CARE INSTRUCTIONS  No special instructions are needed after a transfusion. You may find your energy is better. Speak with your caregiver about any limitations on activity for underlying diseases you may have. SEEK MEDICAL CARE IF:   Your condition is not improving after your transfusion.  You develop redness or  irritation at the intravenous (IV) site. SEEK IMMEDIATE MEDICAL CARE IF:  Any of the following symptoms occur over the next 12 hours:  Shaking chills.  You have a temperature by mouth above 102 F (38.9 C), not controlled by medicine.  Chest, back, or muscle pain.  People around you feel you are not acting correctly or are confused.  Shortness of breath or difficulty breathing.  Dizziness and fainting.  You get a rash or develop hives.  You have a decrease in urine output.  Your urine turns a dark color or changes to pink, red, or brown. Any of the following symptoms occur over the next 10 days:  You have a temperature by mouth above 102 F (38.9 C), not controlled by medicine.  Shortness of breath.  Weakness after normal activity.  The white part of the eye turns yellow (jaundice).  You have a decrease in the amount of urine or are urinating less often.  Your urine turns a dark color or changes to pink, red, or brown. Document Released: 06/27/2000 Document Revised: 09/22/2011 Document Reviewed: 02/14/2008 [facility] Patient Information 2014 [place], [place].  _______________________________________________________________________

## 2016-05-16 LAB — HEMOGLOBIN A1C
HEMOGLOBIN A1C: 11.6 % — AB (ref 4.8–5.6)
Mean Plasma Glucose: 286 mg/dL

## 2016-05-16 NOTE — Progress Notes (Signed)
Faxed HgbA1c report to [employer], and webb via epic

## 2016-05-20 ENCOUNTER — Encounter (HOSPITAL_COMMUNITY): Admission: RE | Payer: Self-pay | Source: Ambulatory Visit

## 2016-05-20 ENCOUNTER — Inpatient Hospital Stay (HOSPITAL_COMMUNITY)
Admission: RE | Admit: 2016-05-20 | Payer: BLUE CROSS/BLUE SHIELD | Source: Ambulatory Visit | Admitting: Orthopedic Surgery

## 2016-05-20 LAB — TYPE AND SCREEN
ABO/RH(D): B NEG
ANTIBODY SCREEN: NEGATIVE

## 2016-05-20 SURGERY — ARTHROPLASTY, KNEE, TOTAL
Anesthesia: Spinal | Site: Knee | Laterality: Left

## 2016-06-19 ENCOUNTER — Telehealth: Payer: Self-pay | Admitting: Nurse Practitioner
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
## 2016-06-19 NOTE — Telephone Encounter (Signed)
New message      Pt c/o Shortness Of Breath: STAT if SOB developed within the last 24 hours or pt is noticeably SOB on the phone  1. Are you currently SOB (can you hear that pt is SOB on the phone)?  Yes---but pt has COPD and has an inhaler 2. How long have you been experiencing SOB?  Couple of days 3. Are you SOB when sitting or when up moving around? Only on exertion 4. Are you currently experiencing any other symptoms?  Dizziness and lightheadedness

## 2016-06-20 ENCOUNTER — Encounter: Payer: Self-pay | Admitting: Cardiology

## 2016-06-20 ENCOUNTER — Telehealth: Payer: Self-pay

## 2016-06-20 NOTE — Telephone Encounter (Signed)
Received incoming call. Pt hung up before I could speak with pt. Tried to call back, but could not reach pt at work number or mobile number (unable to leave msg).

## 2016-06-23 ENCOUNTER — Inpatient Hospital Stay (HOSPITAL_COMMUNITY)
Admission: EM | Admit: 2016-06-23 | Discharge: 2016-06-25 | DRG: 291 | Disposition: A | Discharge status: Home / Self Care | Payer: BLUE CROSS/BLUE SHIELD | Attending: Internal Medicine | Admitting: Internal Medicine

## 2016-06-23 ENCOUNTER — Encounter (HOSPITAL_COMMUNITY): Payer: Self-pay | Admitting: Nurse Practitioner

## 2016-06-23 ENCOUNTER — Emergency Department (HOSPITAL_COMMUNITY): Payer: BLUE CROSS/BLUE SHIELD

## 2016-06-23 DIAGNOSIS — J441 Chronic obstructive pulmonary disease with (acute) exacerbation: Secondary | ICD-10-CM | POA: Diagnosis not present

## 2016-06-23 DIAGNOSIS — Z7984 Long term (current) use of oral hypoglycemic drugs: Secondary | ICD-10-CM

## 2016-06-23 DIAGNOSIS — E669 Obesity, unspecified: Secondary | ICD-10-CM | POA: Diagnosis present

## 2016-06-23 DIAGNOSIS — Z7982 Long term (current) use of aspirin: Secondary | ICD-10-CM | POA: Diagnosis not present

## 2016-06-23 DIAGNOSIS — I110 Hypertensive heart disease with heart failure: Principal | ICD-10-CM | POA: Diagnosis present

## 2016-06-23 DIAGNOSIS — Z981 Arthrodesis status: Secondary | ICD-10-CM

## 2016-06-23 DIAGNOSIS — E1169 Type 2 diabetes mellitus with other specified complication: Secondary | ICD-10-CM | POA: Diagnosis not present

## 2016-06-23 DIAGNOSIS — I2510 Atherosclerotic heart disease of native coronary artery without angina pectoris: Secondary | ICD-10-CM | POA: Diagnosis present

## 2016-06-23 DIAGNOSIS — N390 Urinary tract infection, site not specified: Secondary | ICD-10-CM | POA: Diagnosis present

## 2016-06-23 DIAGNOSIS — Z9841 Cataract extraction status, right eye: Secondary | ICD-10-CM

## 2016-06-23 DIAGNOSIS — E7800 Pure hypercholesterolemia, unspecified: Secondary | ICD-10-CM | POA: Diagnosis present

## 2016-06-23 DIAGNOSIS — Z7951 Long term (current) use of inhaled steroids: Secondary | ICD-10-CM | POA: Diagnosis not present

## 2016-06-23 DIAGNOSIS — Z9842 Cataract extraction status, left eye: Secondary | ICD-10-CM | POA: Diagnosis not present

## 2016-06-23 DIAGNOSIS — Z85118 Personal history of other malignant neoplasm of bronchus and lung: Secondary | ICD-10-CM

## 2016-06-23 DIAGNOSIS — Z87891 Personal history of nicotine dependence: Secondary | ICD-10-CM | POA: Diagnosis not present

## 2016-06-23 DIAGNOSIS — E1142 Type 2 diabetes mellitus with diabetic polyneuropathy: Secondary | ICD-10-CM | POA: Diagnosis present

## 2016-06-23 DIAGNOSIS — Z880 Allergy status to penicillin: Secondary | ICD-10-CM

## 2016-06-23 DIAGNOSIS — Z833 Family history of diabetes mellitus: Secondary | ICD-10-CM

## 2016-06-23 DIAGNOSIS — Z791 Long term (current) use of non-steroidal anti-inflammatories (NSAID): Secondary | ICD-10-CM

## 2016-06-23 DIAGNOSIS — J9601 Acute respiratory failure with hypoxia: Secondary | ICD-10-CM | POA: Diagnosis present

## 2016-06-23 DIAGNOSIS — E118 Type 2 diabetes mellitus with unspecified complications: Secondary | ICD-10-CM | POA: Diagnosis present

## 2016-06-23 DIAGNOSIS — I10 Essential (primary) hypertension: Secondary | ICD-10-CM | POA: Diagnosis present

## 2016-06-23 DIAGNOSIS — Z9119 Patient's noncompliance with other medical treatment and regimen: Secondary | ICD-10-CM

## 2016-06-23 DIAGNOSIS — T501X6A Underdosing of loop [high-ceiling] diuretics, initial encounter: Secondary | ICD-10-CM | POA: Diagnosis present

## 2016-06-23 DIAGNOSIS — I5033 Acute on chronic diastolic (congestive) heart failure: Secondary | ICD-10-CM | POA: Diagnosis not present

## 2016-06-23 DIAGNOSIS — F329 Major depressive disorder, single episode, unspecified: Secondary | ICD-10-CM | POA: Diagnosis present

## 2016-06-23 DIAGNOSIS — Z8249 Family history of ischemic heart disease and other diseases of the circulatory system: Secondary | ICD-10-CM | POA: Diagnosis not present

## 2016-06-23 DIAGNOSIS — J449 Chronic obstructive pulmonary disease, unspecified: Secondary | ICD-10-CM | POA: Diagnosis not present

## 2016-06-23 DIAGNOSIS — Z6838 Body mass index (BMI) 38.0-38.9, adult: Secondary | ICD-10-CM | POA: Diagnosis not present

## 2016-06-23 DIAGNOSIS — Z79899 Other long term (current) drug therapy: Secondary | ICD-10-CM

## 2016-06-23 DIAGNOSIS — I5023 Acute on chronic systolic (congestive) heart failure: Secondary | ICD-10-CM

## 2016-06-23 DIAGNOSIS — R0602 Shortness of breath: Secondary | ICD-10-CM | POA: Diagnosis not present

## 2016-06-23 DIAGNOSIS — Z794 Long term (current) use of insulin: Secondary | ICD-10-CM

## 2016-06-23 LAB — CBC WITH DIFFERENTIAL/PLATELET
Basophils Absolute: 0 10*3/uL (ref 0.0–0.1)
Basophils Relative: 0 %
EOS ABS: 0.2 10*3/uL (ref 0.0–0.7)
Eosinophils Relative: 3 %
HCT: 38.9 % (ref 36.0–46.0)
HEMOGLOBIN: 12.1 g/dL (ref 12.0–15.0)
LYMPHS ABS: 0.8 10*3/uL (ref 0.7–4.0)
LYMPHS PCT: 13 %
MCH: 26.8 pg (ref 26.0–34.0)
MCHC: 31.1 g/dL (ref 30.0–36.0)
MCV: 86.3 fL (ref 78.0–100.0)
MONOS PCT: 10 %
Monocytes Absolute: 0.6 10*3/uL (ref 0.1–1.0)
NEUTROS PCT: 74 %
Neutro Abs: 4.4 10*3/uL (ref 1.7–7.7)
Platelets: 167 10*3/uL (ref 150–400)
RBC: 4.51 MIL/uL (ref 3.87–5.11)
RDW: 15.9 % — ABNORMAL HIGH (ref 11.5–15.5)
WBC: 5.9 10*3/uL (ref 4.0–10.5)

## 2016-06-23 LAB — COMPREHENSIVE METABOLIC PANEL
ALBUMIN: 3.2 g/dL — AB (ref 3.5–5.0)
ALK PHOS: 156 U/L — AB (ref 38–126)
ALT: 31 U/L (ref 14–54)
ANION GAP: 8 (ref 5–15)
AST: 29 U/L (ref 15–41)
BILIRUBIN TOTAL: 0.6 mg/dL (ref 0.3–1.2)
BUN: 21 mg/dL — AB (ref 6–20)
CALCIUM: 8.8 mg/dL — AB (ref 8.9–10.3)
CO2: 26 mmol/L (ref 22–32)
Chloride: 103 mmol/L (ref 101–111)
Creatinine, Ser: 0.75 mg/dL (ref 0.44–1.00)
GFR calc Af Amer: >60 mL/min (ref >60)
GFR calc non Af Amer: >60 mL/min (ref >60)
GLUCOSE: 233 mg/dL — AB (ref 65–99)
Potassium: 4.1 mmol/L (ref 3.5–5.1)
Sodium: 137 mmol/L (ref 135–145)
TOTAL PROTEIN: 5.9 g/dL — AB (ref 6.5–8.1)

## 2016-06-23 LAB — GLUCOSE, CAPILLARY: GLUCOSE-CAPILLARY: 221 mg/dL — AB (ref 65–99)

## 2016-06-23 LAB — BRAIN NATRIURETIC PEPTIDE: B Natriuretic Peptide: 435.9 pg/mL — ABNORMAL HIGH (ref 0.0–100.0)

## 2016-06-23 LAB — I-STAT TROPONIN, ED: Troponin i, poc: 0 ng/mL (ref 0.00–0.08)

## 2016-06-23 MED ORDER — IPRATROPIUM-ALBUTEROL 0.5-2.5 (3) MG/3ML IN SOLN
3.0000 mL | Freq: Once | RESPIRATORY_TRACT | Status: AC
  Administered 2016-06-23: 3 mL via RESPIRATORY_TRACT
  Filled 2016-06-23: qty 3

## 2016-06-23 MED ORDER — SITAGLIPTIN PHOS-METFORMIN HCL 50-1000 MG PO TABS: 1.0000 | ORAL_TABLET | Freq: Two times a day (BID) | ORAL | Status: DC

## 2016-06-23 MED ORDER — SODIUM CHLORIDE 0.9% FLUSH
3.0000 mL | Freq: Two times a day (BID) | INTRAVENOUS | Status: DC
  Administered 2016-06-23 – 2016-06-25 (×4): 3 mL via INTRAVENOUS

## 2016-06-23 MED ORDER — ACETAMINOPHEN 325 MG PO TABS
650.0000 mg | ORAL_TABLET | ORAL | Status: DC | PRN
  Administered 2016-06-23 – 2016-06-25 (×2): 650 mg via ORAL
  Filled 2016-06-23 (×2): qty 2

## 2016-06-23 MED ORDER — IPRATROPIUM-ALBUTEROL 0.5-2.5 (3) MG/3ML IN SOLN: 3.0000 mL | RESPIRATORY_TRACT | Status: DC | PRN

## 2016-06-23 MED ORDER — ATORVASTATIN CALCIUM 10 MG PO TABS
20.0000 mg | ORAL_TABLET | Freq: Every day | ORAL | Status: DC
  Administered 2016-06-24 – 2016-06-25 (×2): 20 mg via ORAL
  Filled 2016-06-23 (×2): qty 2

## 2016-06-23 MED ORDER — FUROSEMIDE 10 MG/ML IJ SOLN
40.0000 mg | Freq: Every day | INTRAMUSCULAR | Status: DC
  Administered 2016-06-24 – 2016-06-25 (×2): 40 mg via INTRAVENOUS
  Filled 2016-06-23 (×2): qty 4

## 2016-06-23 MED ORDER — ONDANSETRON HCL 4 MG/2ML IJ SOLN: 4.0000 mg | Freq: Four times a day (QID) | INTRAMUSCULAR | Status: DC | PRN

## 2016-06-23 MED ORDER — ASPIRIN EC 81 MG PO TBEC
81.0000 mg | DELAYED_RELEASE_TABLET | Freq: Every evening | ORAL | Status: DC
  Administered 2016-06-23 – 2016-06-24 (×2): 81 mg via ORAL
  Filled 2016-06-23 (×2): qty 1

## 2016-06-23 MED ORDER — ENOXAPARIN SODIUM 40 MG/0.4ML ~~LOC~~ SOLN
40.0000 mg | SUBCUTANEOUS | Status: DC
  Administered 2016-06-23 – 2016-06-24 (×2): 40 mg via SUBCUTANEOUS
  Filled 2016-06-23 (×2): qty 0.4

## 2016-06-23 MED ORDER — LIRAGLUTIDE 18 MG/3ML ~~LOC~~ SOPN: 1.8000 mg | PEN_INJECTOR | Freq: Every day | SUBCUTANEOUS | Status: DC

## 2016-06-23 MED ORDER — GABAPENTIN 300 MG PO CAPS
300.0000 mg | ORAL_CAPSULE | Freq: Three times a day (TID) | ORAL | Status: DC
  Administered 2016-06-23 – 2016-06-25 (×5): 300 mg via ORAL
  Filled 2016-06-23 (×5): qty 1

## 2016-06-23 MED ORDER — POTASSIUM CHLORIDE CRYS ER 10 MEQ PO TBCR
10.0000 meq | EXTENDED_RELEASE_TABLET | Freq: Every day | ORAL | Status: DC
  Administered 2016-06-24 – 2016-06-25 (×2): 10 meq via ORAL
  Filled 2016-06-23 (×3): qty 1

## 2016-06-23 MED ORDER — NAPROXEN SODIUM 275 MG PO TABS
440.0000 mg | ORAL_TABLET | Freq: Two times a day (BID) | ORAL | Status: DC | PRN
  Filled 2016-06-23: qty 2

## 2016-06-23 MED ORDER — SERTRALINE HCL 50 MG PO TABS
150.0000 mg | ORAL_TABLET | Freq: Every day | ORAL | Status: DC
  Administered 2016-06-23 – 2016-06-25 (×3): 150 mg via ORAL
  Filled 2016-06-23 (×3): qty 3

## 2016-06-23 MED ORDER — CELECOXIB 100 MG PO CAPS
100.0000 mg | ORAL_CAPSULE | Freq: Two times a day (BID) | ORAL | Status: DC
  Administered 2016-06-23 – 2016-06-25 (×4): 100 mg via ORAL
  Filled 2016-06-23 (×4): qty 1

## 2016-06-23 MED ORDER — ATORVASTATIN CALCIUM 10 MG PO TABS: 20.0000 mg | ORAL_TABLET | Freq: Every day | ORAL | Status: DC

## 2016-06-23 MED ORDER — METFORMIN HCL 500 MG PO TABS
1000.0000 mg | ORAL_TABLET | Freq: Two times a day (BID) | ORAL | Status: DC
  Administered 2016-06-23 – 2016-06-25 (×4): 1000 mg via ORAL
  Filled 2016-06-23 (×4): qty 2

## 2016-06-23 MED ORDER — POTASSIUM CHLORIDE ER 10 MEQ PO TBCR: 10.0000 meq | EXTENDED_RELEASE_TABLET | Freq: Every day | ORAL | Status: DC

## 2016-06-23 MED ORDER — PIOGLITAZONE HCL 30 MG PO TABS: 30.0000 mg | ORAL_TABLET | Freq: Every day | ORAL | Status: DC

## 2016-06-23 MED ORDER — INSULIN ASPART 100 UNIT/ML ~~LOC~~ SOLN
0.0000 [IU] | Freq: Three times a day (TID) | SUBCUTANEOUS | Status: DC
  Administered 2016-06-24 (×2): 2 [IU] via SUBCUTANEOUS
  Administered 2016-06-24: 1 [IU] via SUBCUTANEOUS
  Administered 2016-06-25: 3 [IU] via SUBCUTANEOUS

## 2016-06-23 MED ORDER — FLUTICASONE FUROATE-VILANTEROL 200-25 MCG/INH IN AEPB
1.0000 | INHALATION_SPRAY | Freq: Every day | RESPIRATORY_TRACT | Status: DC
  Administered 2016-06-24 – 2016-06-25 (×2): 1 via RESPIRATORY_TRACT
  Filled 2016-06-23: qty 28

## 2016-06-23 MED ORDER — SERTRALINE HCL 50 MG PO TABS: 150.0000 mg | ORAL_TABLET | Freq: Every day | ORAL | Status: DC

## 2016-06-23 MED ORDER — LINAGLIPTIN 5 MG PO TABS
5.0000 mg | ORAL_TABLET | Freq: Every day | ORAL | Status: DC
  Administered 2016-06-23 – 2016-06-25 (×3): 5 mg via ORAL
  Filled 2016-06-23 (×3): qty 1

## 2016-06-23 MED ORDER — SODIUM CHLORIDE 0.9% FLUSH: 3.0000 mL | INTRAVENOUS | Status: DC | PRN

## 2016-06-23 MED ORDER — SODIUM CHLORIDE 0.9 % IV SOLN: 250.0000 mL | INTRAVENOUS | Status: DC | PRN

## 2016-06-23 MED ORDER — FUROSEMIDE 10 MG/ML IJ SOLN
40.0000 mg | Freq: Once | INTRAMUSCULAR | Status: AC
  Administered 2016-06-23: 40 mg via INTRAVENOUS
  Filled 2016-06-23: qty 4

## 2016-06-23 NOTE — ED Triage Notes (Signed)
Per EMS pt from [facility] to evaluated for shortness of breath. Patient has hx of CHF and COPD. Pt has been off of lasix for 1 week. Pt notes 20 pound weight gain over one week with noted edema in abdomen and bilateral LE. Lungs clear throughout. RA O2 sats were 78-80% patient was 92% on 1L of O2 and 94%/2L of O2 via [place].

## 2016-06-23 NOTE — H&P (Addendum)
History and Physical    Amy Shepard SUP:[id number] DOB: [birth year] DOA: 06/23/2016   PCP: [name], MD Chief Complaint:  Chief Complaint  Patient presents with  . Shortness of Breath    HPI: Amy Shepard is a 70 y.o. female with medical history significant of grade 2 diastolic CHF, COPD, DM, HTN, HLD, lung CA in remission.  Patient presents to the ED with c/o SOB, peripheral edema, large amount of weight gain.  Symptoms onset a week ago and have been worsening since that time.  They occur in the context of her loosing her bottle of 68m lasix that she was on daily.  Worse laying down, better sitting up.  Not on home O2 at baseline.  ED Course: New O2 requirement.  CXR confirms pulmonary edema.  Getting 450mIV lasix in ED.  Review of Systems: As per HPI otherwise 10 point review of systems negative.    Past Medical History:  Diagnosis Date  . Arthritis   . [place]   [place] ca  . CH[place] ([place]estive heart failure) ([facility]  . COPD (chronic obstructive pulmonary disease) ([facility] [place]  . Depression   . [place]    [place]  [place]  [place]  . Hx of seasonal allergies   . Hypercholesteremia   . Hypertension    on no medications  . Lung cancer ([facility]  . [facility]    [facility]. Shortness of breath dyspnea    doe    Past Surgical History:  Procedure Laterality Date  [place] [facility] [place]  . LEFT HEART CATHETERIZATION WITH CORONARY ANGIOGRAM N/A 07/12/2014   Procedure: LEFT HEART CATHETERIZATION WITH CORONARY ANGIOGRAM;  Surgeon: HeSinclair GroomsMD;  [facility];  Service: Cardiovascular;  Laterality: N/A;  . MAXIMUM ACCESS (MAS)POSTERIOR LUMBAR INTERBODY FUSION (PLIF) 2 LEVEL N/A 02/22/2016   Procedure: Lumbar three-four - Lumbar four-five  MAXIMUM ACCESS SURGERY  POSTERIOR LUMBAR INTERBODY FUSION;  Surgeon: DaEustace MooreMD;  [facility];  Service: Neurosurgery;  Laterality: N/A;  . THORACOTOMY Right 2010   lower  . TONSILLECTOMY       reports that she quit smoking about 9 years ago. She has a 30.00 pack-year smoking history. She has never used smokeless tobacco. She reports that she does not drink alcohol or use drugs.  Allergies  Allergen Reactions  . Amoxicillin Anaphylaxis, Hives and Rash    Has patient had a PCN reaction causing immediate rash, facial/tongue/throat swelling, SOB or lightheadedness with hypotension: YES Positive reaction causing SEVERE RASH INVOLVING MUCUS MEMBRANES/SKIN NECROSIS: YES Reaction that required HOSPITALIZATION: YES Reaction occurring within the last 10 years: NO    Family History  Problem Relation Age of Onset  . Diabetes Father   . Heart failure Father   . Cancer Sister   . Diabetes Brother   . Heart failure Brother       Prior to Admission medications   Medication Sig Start Date End Date Taking? Authorizing Provider  aspirin EC 81 MG tablet Take 81 mg by mouth every evening.   Yes Historical Provider, MD  atorvastatin (LIPITOR) 20 MG tablet Take 20 mg by mouth daily.   Yes Historical Provider, MD  celecoxib (CELEBREX) 100 MG capsule Take 1 capsule (100 mg total) by mouth daily as  needed for mild pain. Patient taking differently: Take 100 mg by mouth 2 (two) times daily.  03/13/16  Yes Clanford Marisa Hua, MD  Fluticasone-Salmeterol (ADVAIR) 250-50 MCG/DOSE AEPB Inhale 1 puff into the lungs 2 (two) times daily.   Yes Historical Provider, MD  furosemide (LASIX) 20 MG tablet Take 1 tablet (20 mg total) by mouth daily. 05/05/16  Yes Burtis Junes, NP  gabapentin (NEURONTIN) 300 MG capsule Take 300 mg by mouth 3 (three) times daily.   Yes Historical Provider, MD  ipratropium-albuterol (DUONEB) 0.5-2.5 (3) MG/3ML SOLN Take 3 mLs by nebulization every 4 (four) hours as needed (wheezing, Shortness of breath). 03/13/16  Yes Clanford Marisa Hua, MD  Liraglutide (VICTOZA) 18  MG/3ML SOPN Inject 1.8 mg into the skin daily.   Yes Historical Provider, MD  naproxen sodium (ANAPROX) 220 MG tablet Take 440 mg by mouth 2 (two) times daily as needed (for pain.).   Yes Historical Provider, MD  pioglitazone (ACTOS) 30 MG tablet Take 30 mg by mouth daily.   Yes Historical Provider, MD  sertraline (ZOLOFT) 100 MG tablet Take 150 mg by mouth daily.    Yes Historical Provider, MD  sitaGLIPtin-metformin (JANUMET) 50-1000 MG per tablet Take 1 tablet by mouth 2 (two) times daily with a meal.   Yes Historical Provider, MD  potassium chloride (K-DUR) 10 MEQ tablet Take 1 tablet (10 mEq total) by mouth daily. Patient not taking: Reported on 06/23/2016 03/13/16   Murlean Iba, MD    Physical Exam: Vitals:   06/23/16 1800 06/23/16 1815 06/23/16 1822 06/23/16 2101  BP: 136/68 140/80 140/80 138/86  Pulse: 71 72 73 70  Resp: _0 Temp:      TempSrc:      SpO2: 97% 96% 93% 99%  Weight:      Height:          Constitutional: NAD, calm, comfortable Eyes: PERRL, lids and conjunctivae normal ENMT: Mucous membranes are moist. Posterior pharynx clear of any exudate or lesions.Normal dentition.  Neck: normal, supple, no masses, no thyromegaly Respiratory: clear to auscultation bilaterally, no wheezing, no crackles. Normal respiratory effort. No accessory muscle use.  Cardiovascular: Regular rate and rhythm, no murmurs / rubs / gallops. 3+ pitting edema. 2+ pedal pulses. No carotid bruits.  Abdomen: no tenderness, no masses palpated. No hepatosplenomegaly. Bowel sounds positive.  Musculoskeletal: no clubbing / cyanosis. No joint deformity upper and lower extremities. Good ROM, no contractures. Normal muscle tone.  Skin: no rashes, lesions, ulcers. No induration Neurologic: CN 2-12 grossly intact. Sensation intact, DTR normal. Strength 5/5 in all 4.  Psychiatric: Normal judgment and insight. Alert and oriented x 3. Normal mood.    Labs on Admission: I have personally  reviewed following labs and imaging studies  CBC:  Recent Labs Lab 06/23/16 1650  WBC 5.9  NEUTROABS 4.4  HGB 12.1  HCT 38.9  MCV 86.3  PLT 013   Basic Metabolic Panel:  Recent Labs Lab 06/23/16 1650  NA 137  K 4.1  CL 103  CO2 26  GLUCOSE 233*  BUN 21*  CREATININE 0.75  CALCIUM 8.8*   GFR: Estimated Creatinine Clearance: 67.2 mL/min (by C-G formula based on SCr of 0.75 mg/dL). Liver Function Tests:  Recent Labs Lab 06/23/16 1650  AST 29  ALT 31  ALKPHOS 156*  BILITOT 0.6  PROT 5.9*  ALBUMIN 3.2*   No results for input(s): LIPASE, AMYLASE in the last 168 hours. No results for input(s): AMMONIA in  the last 168 hours. Coagulation Profile: No results for input(s): INR, PROTIME in the last 168 hours. Cardiac Enzymes: No results for input(s): CKTOTAL, CKMB, CKMBINDEX, TROPONINI in the last 168 hours. BNP (last 3 results) No results for input(s): PROBNP in the last 8760 hours. HbA1C: No results for input(s): HGBA1C in the last 72 hours. CBG: No results for input(s): GLUCAP in the last 168 hours. Lipid Profile: No results for input(s): CHOL, HDL, LDLCALC, TRIG, CHOLHDL, LDLDIRECT in the last 72 hours. Thyroid Function Tests: No results for input(s): TSH, T4TOTAL, FREET4, T3FREE, THYROIDAB in the last 72 hours. Anemia Panel: No results for input(s): VITAMINB12, FOLATE, FERRITIN, TIBC, IRON, RETICCTPCT in the last 72 hours. Urine analysis:    Component Value Date/Time   COLORURINE RED (A) 02/10/2013 1041   APPEARANCEUR CLOUDY (A) 02/10/2013 1041   LABSPEC 1.025 02/10/2013 1041   PHURINE 5.0 02/10/2013 1041   GLUCOSEU NEGATIVE 02/10/2013 1041   HGBUR LARGE (A) 02/10/2013 1041   BILIRUBINUR SMALL (A) 02/10/2013 1041   KETONESUR NEGATIVE 02/10/2013 1041   PROTEINUR 30 (A) 02/10/2013 1041   UROBILINOGEN 0.2 02/10/2013 1041   NITRITE NEGATIVE 02/10/2013 1041   LEUKOCYTESUR MODERATE (A) 02/10/2013 1041   Sepsis  Labs: _0 (procalcitonin:4,lacticidven:4) )No results found for this or any previous visit (from the past 240 hour(s)).   Radiological Exams on Admission: Dg Chest 2 View  Result Date: 06/23/2016 CLINICAL DATA:  SOB on exertion. Patient has hx of CHF and COPD. Pt has been off of lasix for 1 week. Pt notes 20 pound weight gain over one week with noted edema in abdomen and bilateral LE. EXAM: CHEST  2 VIEW COMPARISON:  05/15/2016 FINDINGS: Mild to moderate enlargement of the cardiopericardial silhouette. No mediastinal or hilar masses. No convincing adenopathy. There is bilateral interstitial thickening increased when compared the prior exam. No focal lung consolidation. No pleural effusion or pneumothorax. Stable changes from previous right lung surgery. Skeletal structures are demineralized but grossly intact. IMPRESSION: 1. Cardiomegaly with interstitial thickening suggesting interstitial edema. No convincing pneumonia. Electronically Signed   By: Lajean Manes M.D.   On: 06/23/2016 18:52    EKG: Independently reviewed.  Assessment/Plan Principal Problem:   Acute on chronic diastolic CHF (congestive heart failure) ([name]) Active Problems:   COPD (chronic obstructive pulmonary disease) ([name])   Diabetes mellitus type 2 in obese ([name])   Acute respiratory failure with hypoxia ([name])   HTN (hypertension)    1. Acute on chronic diastolic CHF - due to not being on home lasix for a week 1. CHF pathway 2. Last EF in Aug was normal, echo showed grade 2 diastolic dysfunction 3. Lasix 8m IV daily ordered here 4. Tele monitor 5. Also want to stop the patients Actos since there is a black box warning on this associated with CHF. 2. DM2 - 1. Continue home meds except Actos 2. Sensitive scale SSI AC 3. CBG checks AC/HS 4. Did discuss with patient she may want to be off of Actos permanently (see FDA black box warnings regarding this and CHF). 3. Acute resp failure with hypoxia - 1. O2 via  [place] 4. HTN - continue home meds 5. COPD - continue inhalers   DVT prophylaxis: Lovenox Code Status: Full Family Communication: No family in room Consults called: None Admission status: Admit to inpatient due to new oxygen requirement   GEtta QuillDO [facility] Pager [phone] 7PM-7AM  If 7AM-7PM, please contact the day physician for the patient [email or web address] Password [email or web address] 06/23/2016,  9:09 PM

## 2016-06-23 NOTE — ED Notes (Signed)
Report Given to [facility] RN.  Patient is stable to be transported at this time.  A&Ox4
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
## 2016-06-23 NOTE — ED Notes (Signed)
Heart Healthy Diet Ordered

## 2016-06-24 DIAGNOSIS — E1169 Type 2 diabetes mellitus with other specified complication: Secondary | ICD-10-CM

## 2016-06-24 DIAGNOSIS — J449 Chronic obstructive pulmonary disease, unspecified: Secondary | ICD-10-CM

## 2016-06-24 DIAGNOSIS — J9601 Acute respiratory failure with hypoxia: Secondary | ICD-10-CM

## 2016-06-24 DIAGNOSIS — E669 Obesity, unspecified: Secondary | ICD-10-CM

## 2016-06-24 DIAGNOSIS — I10 Essential (primary) hypertension: Secondary | ICD-10-CM

## 2016-06-24 DIAGNOSIS — I5033 Acute on chronic diastolic (congestive) heart failure: Secondary | ICD-10-CM

## 2016-06-24 LAB — URINALYSIS, ROUTINE W REFLEX MICROSCOPIC
BILIRUBIN URINE: NEGATIVE
GLUCOSE, UA: NEGATIVE mg/dL
KETONES UR: NEGATIVE mg/dL
NITRITE: POSITIVE — AB
PROTEIN: NEGATIVE mg/dL
Specific Gravity, Urine: 1.006 (ref 1.005–1.030)
pH: 5 (ref 5.0–8.0)

## 2016-06-24 LAB — BASIC METABOLIC PANEL
ANION GAP: 10 (ref 5–15)
BUN: 22 mg/dL — ABNORMAL HIGH (ref 6–20)
CALCIUM: 9 mg/dL (ref 8.9–10.3)
CHLORIDE: 102 mmol/L (ref 101–111)
CO2: 26 mmol/L (ref 22–32)
Creatinine, Ser: 0.79 mg/dL (ref 0.44–1.00)
GFR calc non Af Amer: >60 mL/min (ref >60)
Glucose, Bld: 219 mg/dL — ABNORMAL HIGH (ref 65–99)
Potassium: 4 mmol/L (ref 3.5–5.1)
SODIUM: 138 mmol/L (ref 135–145)

## 2016-06-24 LAB — GLUCOSE, CAPILLARY
GLUCOSE-CAPILLARY: 167 mg/dL — AB (ref 65–99)
Glucose-Capillary: 183 mg/dL — ABNORMAL HIGH (ref 65–99)
Glucose-Capillary: 174 mg/dL — ABNORMAL HIGH (ref 65–99)
Glucose-Capillary: 140 mg/dL — ABNORMAL HIGH (ref 65–99)
Glucose-Capillary: 114 mg/dL — ABNORMAL HIGH (ref 65–99)

## 2016-06-24 MED ORDER — NITROFURANTOIN MONOHYD MACRO 100 MG PO CAPS
100.0000 mg | ORAL_CAPSULE | Freq: Two times a day (BID) | ORAL | Status: DC
  Administered 2016-06-24 – 2016-06-25 (×2): 100 mg via ORAL
  Filled 2016-06-24 (×2): qty 1

## 2016-06-24 NOTE — Progress Notes (Signed)
Pt is alert and oriented in chair states that her urine is cloudy and malodorous MD notified, sent a U/A for culture.

## 2016-06-24 NOTE — Care Management Note (Signed)
Case Management Note  Patient Details  Name: Amy Shepard MRN: [id number] Date of Birth: 26-May-1946  Subjective/Objective:     Admitted with CHF               Action/Plan: Patient lives at home with her family, daughter and 2 grandsons; PCP: [name], MD; has private insurance with [employer] with prescription drug coverage; pharmacy [facility] choice [facility] and a mail order pharmacy through her insurance [employer]; patient stated that she received her prescription for lasix but lost her medication ( could not find it); her MD wrote another prescription for the medication but her insurance would not cover it due to recent 77 day supply; Most insurance companies will require patient's to pay out of pocket if they lose or misplace their medication after the medication has been filled.  She has a walker and a cane from a walker from a previous back surgery and use them as needed. She thinks that she has scales at home. CM talked to her of the importance of weighing herself daily. Her daughter does all of the cooking and she does use salt in her diet. Patient stated that she is still working with her in decreasing the salt in her diet. CM will continue to follow for DCP. Expected Discharge Date:     Possibly 06/27/2016             Expected Discharge Plan:  Home/Self Care  Discharge planning Services  CM Consult     Status of Service:  In process, will continue to follow  Royston Bake, RN 06/24/2016, 11:00 AM

## 2016-06-24 NOTE — Progress Notes (Signed)
Progress Note    Mozelle Remlinger  JPV:[id number] DOB: 11/16/1945  DOA: 06/23/2016 PCP: [name], MD    Brief Narrative:   Chief complaint: Follow-up CHF  Amy Shepard is an 70 y.o. female with a PMH of grade 2 diastolic CHF, COPD, diabetes, hypertension, hyperlipidemia and lung cancer in remission who was admitted 06/23/16 with a one-week history of increasing shortness of breath, edema, and weight gain in the setting of having lost her Lasix prescription medication.  Assessment/Plan:   Principal Problem:   Acute on chronic diastolic CHF (congestive heart failure) ([name])-Acute respiratory failure with hypoxia Admitted and placed on 40 mg of IV Lasix twice a day. Continue supplemental oxygen to maintain oxygen saturations. I/O balance -1 L. Weight down 5 pounds. Continue aspirin. Last 2-D echo done 03/12/16: EF 60-65 percent with grade 2 diastolic dysfunction. No need to treat as her exacerbation was related to nonadherence to Lasix therapy.  Active Problems:   COPD (chronic obstructive pulmonary disease) ([name]) Continue Advair and bronchodilators.     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 Continue Victoza and Janumet. Continue gabapentin. Continue SSI and monitor glycemic control.    HTN (hypertension) Continue Lasix.    Hyperlipidemia Continue Lipitor.    Foul-smelling urine Patient reports a foul smell to her urine. No frank dysuria. We'll send urinalysis.   Family Communication/Anticipated D/C date and plan/Code Status   DVT prophylaxis: Lovenox ordered. Code Status: Full Code.  Family Communication: No family at bedside. Disposition Plan: Likely home 06/25/16 when fully diuresed.   Medical Consultants:    None.   Procedures:    None  Anti-Infectives:    None  Subjective:   The patient reports that Her urine has a foul smell but that her breathing is better  from yesterday. Review of symptoms is positive for occasional cough and negative for chest pain.  Objective:    Vitals:   06/23/16 1822 06/23/16 2101 06/23/16 2129 06/24/16 0614  BP: 140/80 138/86 118/61 118/63  Pulse: 73 70 84 79  Resp: _0 Temp:   98 F (36.7 C) 97.7 F (36.5 C)  TempSrc:   Oral Oral  SpO2: 93% 99% 92% 96%  Weight:   87.6 kg (193 lb 3.2 oz) 86.3 kg (190 lb 4.8 oz)  Height:        Intake/Output Summary (Last 24 hours) at 06/24/16 0735 Last data filed at 06/23/16 2332  Gross per 24 hour  Intake                0 ml  Output             1000 ml  Net            -1000 ml   Filed Weights   06/23/16 1649 06/23/16 2129 06/24/16 0614  Weight: 88.6 kg (195 lb 4 oz) 87.6 kg (193 lb 3.2 oz) 86.3 kg (190 lb 4.8 oz)    Exam: General exam: Appears calm and comfortable. Sitting up in chair. Respiratory system: A few bibasilar crackles. Respiratory effort mildly increased. Cardiovascular system: S1 & S2 heard, RRR. No JVD,  rubs, gallops or clicks. No murmurs. Gastrointestinal system: Abdomen is nondistended, soft and nontender. No organomegaly or masses felt. Normal bowel sounds heard. Central nervous system: Alert and oriented. No focal neurological deficits. Extremities: No clubbing,  or cyanosis. 2+ tense edema. Skin: No rashes, lesions or ulcers. Telangiectasias  bilateral cheeks. Psychiatry: Judgement and insight appear normal. Mood & affect appropriate.   Data Reviewed:   I have personally reviewed following labs and imaging studies:  Labs: Basic Metabolic Panel:  Recent Labs Lab 06/23/16 1650 06/24/16 0402  NA 137 138  K 4.1 4.0  CL 103 102  CO2 26 26  GLUCOSE 233* 219*  BUN 21* 22*  CREATININE 0.75 0.79  CALCIUM 8.8* 9.0   GFR Estimated Creatinine Clearance: 66.2 mL/min (by C-G formula based on SCr of 0.79 mg/dL). Liver Function Tests:  Recent Labs Lab 06/23/16 1650  AST 29  ALT 31  ALKPHOS 156*  BILITOT 0.6  PROT 5.9*  ALBUMIN  3.2*   CBC:  Recent Labs Lab 06/23/16 1650  WBC 5.9  NEUTROABS 4.4  HGB 12.1  HCT 38.9  MCV 86.3  PLT 167   CBG:  Recent Labs Lab 06/23/16 2140 06/24/16 0554  GLUCAP 221* 183*    Microbiology No results found for this or any previous visit (from the past 240 hour(s)).  Radiology: Dg Chest 2 View  Result Date: 06/23/2016 CLINICAL DATA:  SOB on exertion. Patient has hx of CHF and COPD. Pt has been off of lasix for 1 week. Pt notes 20 pound weight gain over one week with noted edema in abdomen and bilateral LE. EXAM: CHEST  2 VIEW COMPARISON:  05/15/2016 FINDINGS: Mild to moderate enlargement of the cardiopericardial silhouette. No mediastinal or hilar masses. No convincing adenopathy. There is bilateral interstitial thickening increased when compared the prior exam. No focal lung consolidation. No pleural effusion or pneumothorax. Stable changes from previous right lung surgery. Skeletal structures are demineralized but grossly intact. IMPRESSION: 1. Cardiomegaly with interstitial thickening suggesting interstitial edema. No convincing pneumonia. Electronically Signed   By: Lajean Manes M.D.   On: 06/23/2016 18:52    Medications:   . aspirin EC  81 mg Oral QPM  . atorvastatin  20 mg Oral Daily  . celecoxib  100 mg Oral BID  . enoxaparin (LOVENOX) injection  40 mg Subcutaneous Q24H  . fluticasone furoate-vilanterol  1 puff Inhalation Daily  . furosemide  40 mg Intravenous Daily  . gabapentin  300 mg Oral TID  . insulin aspart  0-9 Units Subcutaneous TID WC  . linagliptin  5 mg Oral Daily   And  . metFORMIN  1,000 mg Oral BID WC  . liraglutide  1.8 mg Subcutaneous Daily  . potassium chloride  10 mEq Oral Daily  . sertraline  150 mg Oral Daily  . sodium chloride flush  3 mL Intravenous Q12H   Continuous Infusions:  I spent 35 minutes time on this encounter, with greater than 50% of the time in face-to-face interacting with her, counseling her about her disease process  and plan of care.   LOS: 1 day   Sherryn Pollino  [facility] Pager [phone]. If unable to reach me by pager, please call my cell phone at [phone].  *Please refer to [email or web address], password [email or web address] to get updated schedule on who will round on this patient, as hospitalists switch teams weekly. If 7PM-7AM, please contact night-coverage at [email or web address], password [email or web address] for any overnight needs.  06/24/2016, 7:35 AM

## 2016-06-24 NOTE — Progress Notes (Signed)
Pt is sitting up in chair with no distress alert oriented, on 1L of 02 sats 97%, weaning o2, lungs sounds are clear plan to give IV lasix needs a prescription for PO lasix at discharge.

## 2016-06-25 LAB — BASIC METABOLIC PANEL
ANION GAP: 9 (ref 5–15)
BUN: 24 mg/dL — AB (ref 6–20)
CHLORIDE: 102 mmol/L (ref 101–111)
CO2: 28 mmol/L (ref 22–32)
Calcium: 9.1 mg/dL (ref 8.9–10.3)
Creatinine, Ser: 0.95 mg/dL (ref 0.44–1.00)
GFR calc Af Amer: >60 mL/min (ref >60)
GFR, EST NON AFRICAN AMERICAN: 60 mL/min — AB (ref >60)
Glucose, Bld: 199 mg/dL — ABNORMAL HIGH (ref 65–99)
POTASSIUM: 3.9 mmol/L (ref 3.5–5.1)
SODIUM: 139 mmol/L (ref 135–145)

## 2016-06-25 LAB — GLUCOSE, CAPILLARY
GLUCOSE-CAPILLARY: 201 mg/dL — AB (ref 65–99)
GLUCOSE-CAPILLARY: 134 mg/dL — AB (ref 65–99)

## 2016-06-25 MED ORDER — FUROSEMIDE 20 MG PO TABS: 20.0000 mg | ORAL_TABLET | Freq: Every day | ORAL | 2 refills | Status: DC

## 2016-06-25 MED ORDER — ORAL CARE MOUTH RINSE
15.0000 mL | Freq: Two times a day (BID) | OROMUCOSAL | Status: DC
  Administered 2016-06-25: 15 mL via OROMUCOSAL

## 2016-06-25 MED ORDER — NITROFURANTOIN MONOHYD MACRO 100 MG PO CAPS: 100.0000 mg | ORAL_CAPSULE | Freq: Two times a day (BID) | ORAL | 0 refills | Status: DC

## 2016-06-25 NOTE — Discharge Instructions (Signed)
Heart Failure  Heart failure means your heart has trouble pumping blood. This makes it hard for your body to work well. Heart failure is usually a long-term (chronic) condition. You must take good care of yourself and follow your doctor's treatment plan.  HOME CARE   Take your heart medicine as told by your doctor.    Do not stop taking medicine unless your doctor tells you to.    Do not skip any dose of medicine.    Refill your medicines before they run out.    Take other medicines only as told by your doctor or pharmacist.   Stay active if told by your doctor. The elderly and people with severe heart failure should talk with a doctor about physical activity.   Eat heart-healthy foods. Choose foods that are without trans fat and are low in saturated fat, cholesterol, and salt (sodium). This includes fresh or frozen fruits and vegetables, fish, lean meats, fat-free or low-fat dairy foods, whole grains, and high-fiber foods. Lentils and dried peas and beans (legumes) are also good choices.   Limit salt if told by your doctor.   Cook in a healthy way. Roast, grill, broil, bake, poach, steam, or stir-fry foods.   Limit fluids as told by your doctor.   Weigh yourself every morning. Do this after you pee (urinate) and before you eat breakfast. Write down your weight to give to your doctor.   Take your blood pressure and write it down if your doctor tells you to.   Ask your doctor how to check your pulse. Check your pulse as told.   Lose weight if told by your doctor.   Stop smoking or chewing tobacco. Do not use gum or patches that help you quit without your doctor's approval.   Schedule and go to doctor visits as told.   Nonpregnant women should have no more than 1 drink a day. Men should have no more than 2 drinks a day. Talk to your doctor about drinking alcohol.   Stop illegal drug use.   Stay current with shots (immunizations).   Manage your health conditions as told by your doctor.   Learn to  manage your stress.   Rest when you are tired.   If it is really hot outside:    Avoid intense activities.    Use air conditioning or fans, or get in a cooler place.    Avoid caffeine and alcohol.    Wear loose-fitting, lightweight, and light-colored clothing.   If it is really cold outside:    Avoid intense activities.    Layer your clothing.    Wear mittens or gloves, a hat, and a scarf when going outside.    Avoid alcohol.   Learn about heart failure and get support as needed.   Get help to maintain or improve your quality of life and your ability to care for yourself as needed.  GET HELP IF:    You gain weight quickly.   You are more short of breath than usual.   You cannot do your normal activities.   You tire easily.   You cough more than normal, especially with activity.   You have any or more puffiness (swelling) in areas such as your hands, feet, ankles, or belly (abdomen).   You cannot sleep because it is hard to breathe.   You feel like your heart is beating fast (palpitations).   You get dizzy or light-headed when you stand up.  GET HELP   RIGHT AWAY IF:    You have trouble breathing.   There is a change in mental status, such as becoming less alert or not being able to focus.   You have chest pain or discomfort.   You faint.  MAKE SURE YOU:    Understand these instructions.   Will watch your condition.   Will get help right away if you are not doing well or get worse.     This information is not intended to replace advice given to you by your health care provider. Make sure you discuss any questions you have with your health care provider.     Document Released: 04/08/2008 Document Revised: 07/21/2014 Document Reviewed: 08/16/2012  Elsevier Interactive Patient Education 2017 [employer]

## 2016-06-25 NOTE — Discharge Summary (Signed)
Physician Discharge Summary  Amy Shepard VGK:[id number] DOB: [birth year] DOA: 06/23/2016  PCP: [name], MD  Admit date: 06/23/2016 Discharge date: 06/25/2016  Admitted From: Home Discharge disposition: Home   Recommendations for Outpatient Follow-Up:   1. Will need follow-up with PCP to assess volume status/renal function with resumption of diuretic therapy. 2. Note: Actos discontinued due to association with exacerbation of CHF. Will need assessment of glycemic control off this medication.   Discharge Diagnosis:   Principal Problem:    Acute on chronic diastolic CHF (congestive heart failure) ([name]) Active Problems:    COPD (chronic obstructive pulmonary disease) ([name])    Diabetes mellitus type 2 in obese ([name])    Acute respiratory failure with hypoxia ([name])    HTN (hypertension)    Lower urinary tract infection   Discharge Condition: Improved.  Diet recommendation: Low sodium, heart healthy.  Carbohydrate-modified.    History of Present Illness:   *Amy Shepard is an 70 y.o. female with a PMH of grade 2 diastolic CHF, COPD, diabetes, hypertension, hyperlipidemia and lung cancer in remission who was admitted 06/23/16 with a one-week history of increasing shortness of breath, edema, and weight gain in the setting of having lost her Lasix prescription medication.   Hospital Course by Problem:   Principal Problem:   Acute on chronic diastolic CHF (congestive heart failure) ([name])-Acute respiratory failure with hypoxia Admitted and placed on 40 mg of IV Lasix twice a day with good diuresis achieved and resolution of her shortness of breath.  Continue aspirin. Last 2-D echo done 03/12/16: EF 60-65 percent with grade 2 diastolic dysfunction. No need to repeat as her exacerbation was related to nonadherence to Lasix therapy.  Active Problems:   COPD (chronic obstructive pulmonary disease) ([name]) Controlled on Advair and bronchodilators.    Diabetes  mellitus type 2 in obese [facility]) with peripheral neuropathy Actos discontinued due to association with CHF. Continue Victoza and Janumet. Continue gabapentin. Will need close outpatient follow-up of glycemic control.    HTN (hypertension) Continue Lasix. Prescription written for 20 mg dose daily at discharge. Have advised the patient to pay for this out of pocket if necessary (insurance may not cover lost prescriptions).    Hyperlipidemia Continue Lipitor.     UTI/Foul-smelling urine Patient reports a foul smell to her urine. No frank dysuria. Urinalysis consistent with possible infection. Treated empirically with 5 days of Macrobid.    Medical Consultants:    None.   Discharge Exam:   Vitals:   06/25/16 0551 06/25/16 0942  BP: 101/63   Pulse: 85 84  Resp: 18 16  Temp: 97.4 F (36.3 C)    Vitals:   06/24/16 1630 06/24/16 2020 06/25/16 0551 06/25/16 0942  BP: 128/83 118/76 101/63   Pulse: 83 85 85 84  Resp: _0 Temp: 98.8 F (37.1 C) 98.1 F (36.7 C) 97.4 F (36.3 C)   TempSrc: Oral Oral Oral   SpO2: 96% 98% 98% 93%  Weight:   84.6 kg (186 lb 6.4 oz)   Height:        General exam: Appears calm and comfortable. Sitting up in chair. Respiratory system: Clear to auscultation bilaterally except for a few crackles in the left base. Respiratory effort normal. Cardiovascular system: S1 & S2 heard, RRR. No JVD,  rubs, gallops or clicks. No murmurs. Gastrointestinal system: Abdomen is nondistended, soft and nontender. No organomegaly or masses felt. Normal bowel sounds heard. Central nervous system: Alert and oriented. No focal neurological  deficits. Extremities: No clubbing,  or cyanosis. 1+ tense edema. Skin: No rashes, lesions or ulcers. Telangiectasias bilateral cheeks. Psychiatry: Judgement and insight appear normal. Mood & affect appropriate.   The results of significant diagnostics from this hospitalization (including imaging, microbiology, ancillary and  laboratory) are listed below for reference.     Procedures and Diagnostic Studies:   Dg Chest 2 View  Result Date: 06/23/2016 CLINICAL DATA:  SOB on exertion. Patient has hx of CHF and COPD. Pt has been off of lasix for 1 week. Pt notes 20 pound weight gain over one week with noted edema in abdomen and bilateral LE. EXAM: CHEST  2 VIEW COMPARISON:  05/15/2016 FINDINGS: Mild to moderate enlargement of the cardiopericardial silhouette. No mediastinal or hilar masses. No convincing adenopathy. There is bilateral interstitial thickening increased when compared the prior exam. No focal lung consolidation. No pleural effusion or pneumothorax. Stable changes from previous right lung surgery. Skeletal structures are demineralized but grossly intact. IMPRESSION: 1. Cardiomegaly with interstitial thickening suggesting interstitial edema. No convincing pneumonia. Electronically Signed   By: Lajean Manes M.D.   On: 06/23/2016 18:52     Labs:   Basic Metabolic Panel:  Recent Labs Lab 06/23/16 1650 06/24/16 0402 06/25/16 0343  NA 137 138 139  K 4.1 4.0 3.9  CL 103 102 102  CO2 _0 GLUCOSE 233* 219* 199*  BUN 21* 22* 24*  CREATININE 0.75 0.79 0.95  CALCIUM 8.8* 9.0 9.1   GFR Estimated Creatinine Clearance: 55.1 mL/min (by C-G formula based on SCr of 0.95 mg/dL). Liver Function Tests:  Recent Labs Lab 06/23/16 1650  AST 29  ALT 31  ALKPHOS 156*  BILITOT 0.6  PROT 5.9*  ALBUMIN 3.2*    CBC:  Recent Labs Lab 06/23/16 1650  WBC 5.9  NEUTROABS 4.4  HGB 12.1  HCT 38.9  MCV 86.3  PLT 167   CBG:  Recent Labs Lab 06/24/16 1115 06/24/16 1625 06/24/16 2036 06/25/16 0555 06/25/16 1212  GLUCAP 140* 167* 114* 201* 134*     Discharge Instructions:   Discharge Instructions    (HEART FAILURE PATIENTS) Call MD:  Anytime you have any of the following symptoms: 1) 3 pound weight gain in 24 hours or 5 pounds in 1 week 2) shortness of breath, with or without a dry hacking  cough 3) swelling in the hands, feet or stomach 4) if you have to sleep on extra pillows at night in order to breathe.    Complete by:  As directed    Call MD for:  difficulty breathing, headache or visual disturbances    Complete by:  As directed    Call MD for:  extreme fatigue    Complete by:  As directed    Diet - low sodium heart healthy    Complete by:  As directed    Diet Carb Modified    Complete by:  As directed    Discharge instructions    Complete by:  As directed    You were treated for heart failure in the hospital.  To prevent exacerbations of your heart failure, it is important that you check your weight at the same time every day, and that if you gain over 3 pounds in 24 hours or 5 lbs in 1 week, OR you develop worsening swelling to the legs, experience more shortness of breath or chest pain, take an extra dose of Lasix and call your Primary MD or cardiologist.   Follow a heart  healthy and carb modified, low salt diet and restrict your fluid intake to 1.5 liters a day or less.   Increase activity slowly    Complete by:  As directed        Medication List    STOP taking these medications   pioglitazone 30 MG tablet Commonly known as:  ACTOS     TAKE these medications   aspirin EC 81 MG tablet Take 81 mg by mouth every evening.   atorvastatin 20 MG tablet Commonly known as:  LIPITOR Take 20 mg by mouth daily.   celecoxib 100 MG capsule Commonly known as:  CELEBREX Take 1 capsule (100 mg total) by mouth daily as needed for mild pain. What changed:  when to take this   Fluticasone-Salmeterol 250-50 MCG/DOSE Aepb Commonly known as:  ADVAIR Inhale 1 puff into the lungs 2 (two) times daily.   furosemide 20 MG tablet Commonly known as:  LASIX Take 1 tablet (20 mg total) by mouth daily.   gabapentin 300 MG capsule Commonly known as:  NEURONTIN Take 300 mg by mouth 3 (three) times daily.   ipratropium-albuterol 0.5-2.5 (3) MG/3ML Soln Commonly known as:   DUONEB Take 3 mLs by nebulization every 4 (four) hours as needed (wheezing, Shortness of breath).   naproxen sodium 220 MG tablet Commonly known as:  ANAPROX Take 440 mg by mouth 2 (two) times daily as needed (for pain.).   nitrofurantoin (macrocrystal-monohydrate) 100 MG capsule Commonly known as:  MACROBID Take 1 capsule (100 mg total) by mouth every 12 (twelve) hours.   potassium chloride 10 MEQ tablet Commonly known as:  K-DUR Take 1 tablet (10 mEq total) by mouth daily.   sertraline 100 MG tablet Commonly known as:  ZOLOFT Take 150 mg by mouth daily.   sitaGLIPtin-metformin 50-1000 MG tablet Commonly known as:  JANUMET Take 1 tablet by mouth 2 (two) times daily with a meal.   VICTOZA 18 MG/3ML Sopn Generic drug:  liraglutide Inject 1.8 mg into the skin daily.      Follow-up Information    [name], MD. Go on 07/02/2016.   Specialty:  Family Medicine Why:  11:30 am Hospital follow up and to check your weight/fluid volume status. Contact information: [place]  [id number] [phone]            Time coordinating discharge: Greater than 35 minutes.  Signed:  Lovinia Snare  Pager [phone] [facility] 06/25/2016, 4:06 PM

## 2016-06-25 NOTE — Progress Notes (Signed)
After PRN is given for headache patient with no complaints or concerns during 7pm - 7am shift.  Damiah Mcdonald, RN

## 2016-06-25 NOTE — Progress Notes (Signed)
RT found pt on room air, sat 77%. Pt placed on nasal cannula 3 Lpm, sat improved to 93%.

## 2016-06-27 ENCOUNTER — Ambulatory Visit: Payer: BLUE CROSS/BLUE SHIELD | Admitting: Cardiology

## 2016-06-27 LAB — URINE CULTURE: Culture: >=100000 — AB

## 2016-07-01 ENCOUNTER — Ambulatory Visit (INDEPENDENT_AMBULATORY_CARE_PROVIDER_SITE_OTHER): Payer: BLUE CROSS/BLUE SHIELD | Admitting: Cardiology

## 2016-07-01 VITALS — BP 130/70 | HR 78 | Ht 61.0 in | Wt 185.0 lb

## 2016-07-01 DIAGNOSIS — I259 Chronic ischemic heart disease, unspecified: Secondary | ICD-10-CM | POA: Diagnosis not present

## 2016-07-01 DIAGNOSIS — I5033 Acute on chronic diastolic (congestive) heart failure: Secondary | ICD-10-CM | POA: Diagnosis not present

## 2016-07-01 DIAGNOSIS — E782 Mixed hyperlipidemia: Secondary | ICD-10-CM | POA: Diagnosis not present

## 2016-07-01 DIAGNOSIS — I10 Essential (primary) hypertension: Secondary | ICD-10-CM

## 2016-07-01 MED ORDER — FUROSEMIDE 40 MG PO TABS: 40.0000 mg | ORAL_TABLET | Freq: Two times a day (BID) | ORAL | 2 refills | Status: DC

## 2016-07-01 NOTE — Progress Notes (Signed)
CARDIOLOGY OFFICE NOTE  Date:  07/02/2016    Amy Shepard Date of Birth: 04-23-1946 Medical Record #[id number]  PCP:  [name], MD  Cardiologist:  [name]  Chief complain: SOB. LE edema,  History of Present Illness: Amy Shepard is a 70 y.o. female who presents today for a post hospital visit. Seen for [name].   She has had prior lung cancer, has COPD, depression, DM, and known coronary calcification. Has had cardiac catheterization showing nonobstructive disease with a 40 to 60% LAD - risk factor modification was recommended. She had been released from Dr. [name].   Seen back in July after a 2 year absence - needing surgical clearance for lumbar fusion - EKG was abnormal - Myoview updated and this turned out ok - cleared for surgery.   Post op did ok but presented with shortness of breath and hypoxia on her follow up and got readmitted. This was felt to be multifactorial with COPD exacerbation and diastolic HF. Echo with normal EF. CT negative for PE but with new mediastinal adenopathy - recommend repeat study in 4 to 6 weeks.   07/01/2016 - the patient was just discharged from the hospital. She was admitted with URI, COPD exacerbation and acute on chronic diastolic CHF. She was 172 LBS in July and 195 lbs on admission 06/23/2016.  She was treated with iv diuretics, however discharged on lasix 20 mg po daily only. Today she states that any activity makes her SOB. She continues to have LE edema. Denies orthopnea, PND or chest pain.   Past Medical History:  Diagnosis Date  . Arthritis   . Cancer ([place])    lung ca  . CHF (congestive heart failure) ([place]) 02/2016  . COPD (chronic obstructive pulmonary disease) ([place])   . Depression   . Diabetes mellitus    [name]    dx  2008  . Hx of seasonal allergies   . Hypercholesteremia   . Hypertension    on no medications  . Lung cancer ([place])   . Pneumonia    2008  @  [name]  . Shortness of breath  dyspnea    doe    Past Surgical History:  Procedure Laterality Date  . [facility] in [place]     bilateral cataracts  . LEFT HEART CATHETERIZATION WITH CORONARY ANGIOGRAM N/A 07/12/2014   Procedure: LEFT HEART CATHETERIZATION WITH CORONARY ANGIOGRAM;  Surgeon: [name], MD;  Location: [facility] CATH LAB;  Service: Cardiovascular;  Laterality: N/A;  . MAXIMUM ACCESS (MAS)POSTERIOR LUMBAR INTERBODY FUSION (PLIF) 2 LEVEL N/A 02/22/2016   Procedure: Lumbar three-four - Lumbar four-five  MAXIMUM ACCESS SURGERY  POSTERIOR LUMBAR INTERBODY FUSION;  Surgeon: [name], MD;  Location: [facility];  Service: Neurosurgery;  Laterality: N/A;  . THORACOTOMY Right 2010   lower  . TONSILLECTOMY       Medications: Current Outpatient Prescriptions  Medication Sig Dispense Refill  . aspirin EC 81 MG tablet Take 81 mg by mouth every evening.    [name] atorvastatin (LIPITOR) 20 MG tablet Take 20 mg by mouth daily.    . celecoxib (CELEBREX) 100 MG capsule Take 1 capsule (100 mg total) by mouth daily as needed for mild pain.  1  . Fluticasone-Salmeterol (ADVAIR) 250-50 MCG/DOSE AEPB Inhale 1 puff into the lungs 2 (two) times daily.    [name] gabapentin (NEURONTIN) 300 MG  capsule Take 300 mg by mouth 3 (three) times daily.    [name] ipratropium-albuterol (DUONEB) 0.5-2.5 (3) MG/3ML SOLN Take 3 mLs by nebulization every 4 (four) hours as needed (wheezing, Shortness of breath). 360 mL 0  . Liraglutide (VICTOZA) 18 MG/3ML SOPN Inject 1.8 mg into the skin daily.    . naproxen sodium (ANAPROX) 220 MG tablet Take 440 mg by mouth 2 (two) times daily as needed (for pain.).    [name] nitrofurantoin, macrocrystal-monohydrate, (MACROBID) 100 MG capsule Take 1 capsule (100 mg total) by mouth every 12 (twelve) hours. 8 capsule 0  . sertraline (ZOLOFT) 100 MG tablet Take 150 mg by mouth daily.     . sitaGLIPtin-metformin (JANUMET) 50-1000 MG per tablet Take 1 tablet by  mouth 2 (two) times daily with a meal.    . furosemide (LASIX) 40 MG tablet Take 1 tablet (40 mg total) by mouth 2 (two) times daily. Take one dose at 8 am and then take the 2nd dose at 2 pm every day 180 tablet 2  . potassium chloride (K-DUR) 10 MEQ tablet Take 1 tablet (10 mEq total) by mouth daily. (Patient not taking: Reported on 07/01/2016) 30 tablet 0   No current facility-administered medications for this visit.    Facility-Administered Medications Ordered in Other Visits  Medication Dose Route Frequency Provider Last Rate Last Dose  . diatrizoate meglumine-sodium (GASTROGRAFIN) 66-10 % solution 30 mL  30 mL Oral PRN Bjorn Loser, MD   30 mL at 11/30/15 0820    Allergies: Allergies  Allergen Reactions  . Amoxicillin Anaphylaxis, Hives and Rash    Has patient had a PCN reaction causing immediate rash, facial/tongue/throat swelling, SOB or lightheadedness with hypotension: YES Positive reaction causing SEVERE RASH INVOLVING MUCUS MEMBRANES/SKIN NECROSIS: YES Reaction that required HOSPITALIZATION: YES Reaction occurring within the last 10 years: NO    Social History: The patient  reports that she quit smoking about 9 years ago. She has a 30.00 pack-year smoking history. She has never used smokeless tobacco. She reports that she does not drink alcohol or use drugs.   Family History: The patient's family history includes Cancer in her sister; Diabetes in her brother and father; Heart failure in her brother and father.   Review of Systems: Please see the history of present illness.   Otherwise, the review of systems is positive for none.   All other systems are reviewed and negative.   Physical Exam: VS:  BP 130/70   Pulse 78   Ht _0  (1.549 m)   Wt 185 lb (83.9 kg)   SpO2 (!) 88% Comment: 86% walking to room, 88% after sitting 5 mins. Not on oxygen  BMI 34.96 kg/m  .  BMI Body mass index is 34.96 kg/m.  Wt Readings from Last 3 Encounters:  07/01/16 185 lb (83.9 kg)    06/25/16 186 lb 6.4 oz (84.6 kg)  05/15/16 173 lb (78.5 kg)    General: Pleasant. She looks chronically ill but is alert and in no acute distress. She actually looks better today to me since I last saw her.   HEENT: Normal.  Neck: Supple, no JVD, carotid bruits, or masses noted.  Cardiac: Regular rate and rhythm. No murmurs, rubs, or gallops. 2+ B/L edema.  Respiratory:  Lungs have mild rales at bases bilaterally with normal work of breathing.  GI: Soft and nontender.  MS: No deformity or atrophy. Gait and ROM intact. Using a walker.  Skin: Warm and dry. Color is normal.  Neuro:  Strength and sensation are intact and no gross focal deficits noted.  Psych: Alert, appropriate and with normal affect.   LABORATORY DATA:  EKG:  EKG is not ordered today.  Lab Results  Component Value Date   WBC 5.9 06/23/2016   HGB 12.1 06/23/2016   HCT 38.9 06/23/2016   PLT 167 06/23/2016   GLUCOSE 199 (H) 06/25/2016   ALT 31 06/23/2016   AST 29 06/23/2016   NA 139 06/25/2016   K 3.9 06/25/2016   CL 102 06/25/2016   CREATININE 0.95 06/25/2016   BUN 24 (H) 06/25/2016   CO2 28 06/25/2016   INR 1.00 02/01/2016   HGBA1C 11.6 (H) 05/15/2016    BNP (last 3 results)  Recent Labs  03/10/16 1832 03/28/16 1504 06/23/16 1702  BNP 167.6* 93.2 435.9*    ProBNP (last 3 results) No results for input(s): PROBNP in the last 8760 hours.   Other Studies Reviewed Today:  CT CHEST IMPRESSION 03/10/2016: 1. Negative for pulmonary embolus. 2. Congestive heart failure. 3. Scattered areas of collapse/consolidation, possibly due to atelectasis. Difficult to exclude pneumonia. 4. Aortic atherosclerosis and three-vessel coronary artery calcification. 5. Borderline enlarged pulmonary arteries. 6. Mediastinal adenopathy is new from 11/30/2015 and likely due to congestive heart failure. Metastatic disease cannot be definitively excluded in this patient with a history of lung cancer. Consider follow-up  CT chest with contrast in 4-6 weeks, as clinically indicated.   Electronically Signed   By: Lorin Picket M.D.   On: 03/10/2016 18:03  Echo Study Conclusions from 02/2016  - Left ventricle: The cavity size was normal. Wall thickness was   increased in a pattern of mild LVH. Systolic function was normal.   The estimated ejection fraction was in the range of 60% to 65%.   Wall motion was normal; there were no regional wall motion   abnormalities. Features are consistent with a pseudonormal left   ventricular filling pattern, with concomitant abnormal relaxation   and increased filling pressure (grade 2 diastolic dysfunction).   Myoview Study Highlights from 01/2016    Nuclear stress EF: 76%. No wall motion abnormalities  There was no ST segment deviation noted during stress.  Defect 1: There is a small defect of mild severity present in the apex location. No ischemia identified  This is a low risk study.   Candee Furbish, MD       ANGIOGRAPHIC DATA FROM 06/2014: The left main coronary artery is widely patent.  The left anterior descending artery is heavily calcified in the proximal and mid vessel. There is eccentric 40% proximal narrowing. There is 50-60% mid narrowing after the origin of the large diagonal. No hemodynamically significant obstruction is noted in the LAD.  The left circumflex artery is widely patent. There is moderate calcification in the mid and distal vessel. There is 3 obtuse marginal branches. The second marginal is a dominant vessel that contains ectasia proximally but is otherwise normal.  The right coronary arterydominant and widely patent. Luminal irregularities are noted in the mid and distal vessel. [name]  LEFT VENTRICULOGRAM:Left ventricular angiogram was done in the 30 RAO projection and revealed normal LV cavity size and EF of 60%  IMPRESSIONS:1. Nonobstructive coronary artery disease 2. Three-vessel coronary  calcification 3. Normal left ventricular systolic function  RECOMMENDATION:Risk factor modification.  TTE: 03/12/2016 - Left ventricle: The cavity size was normal. Wall thickness was   increased in a pattern of mild LVH. Systolic function was normal.   The estimated ejection fraction  was in the range of 60% to 65%.   Wall motion was normal; there were no regional wall motion   abnormalities. Features are consistent with a pseudonormal left   ventricular filling pattern, with concomitant abnormal relaxation   and increased filling pressure (grade 2 diastolic dysfunction).   Nuclear stress test: 01/2016  Nuclear stress EF: 76%. No wall motion abnormalities  There was no ST segment deviation noted during stress.  Defect 1: There is a small defect of mild severity present in the apex location. No ischemia identified  This is a low risk study.   Assessment/Plan:  1.Acute on chronic diastolic CHF - increase lasix to 40 mg po BID Follow up in 1 week with CMP and BNP.  2. Known coronary disease - stable Myoview recently. No chest pain.    3. HLD - on atorvastatin   4. Abnormal chest CT - follow up study showed Increasing small to moderate pericardial effusion. Similar mediastinal lymphadenopathy which may be reactive though, given patient's history of lung cancer, consider PET-CT. Status post RIGHT thoracotomy and upper lobectomy. Followed by PCP.  Current medicines are reviewed with the patient today.  The patient does not have concerns regarding medicines other than what has been noted above.  The following changes have been made:  See above.  Labs/ tests ordered today include:    Orders Placed This Encounter  Procedures  . Comp Met (CMET)     Disposition:   FU with [name] in 3 to 4 months.  Patient is agreeable to this plan and will call if any problems develop in the interim.   Signed: Burtis Junes, RN, ANP-C 07/02/2016 1:11 AM  [facility] [address] [place], [place]  [address] Phone: [phone] Fax: [phone]

## 2016-07-01 NOTE — Patient Instructions (Signed)
Medication Instructions:   INCREASE YOUR LASIX (FUROSEMIDE) TO 40 MG TWICE DAILY.  TAKE YOUR 1ST DOSE AT 8 AM AND THEN TAKE YOUR 2ND DOSE AT 2 PM EVERYDAY    Labwork:  ON 07/09/16, SAME DAY AS YOU SEE DR [name] ---WE WILL CHECK A CMET    Follow-Up:  ADD ON TO DR NELSON'S QUARTER DAY ON 07/09/16 AT 9:45 AM PER DR NELSON---PT WILL HAVE LAB SAME DAY TO CHECK A CMET       If you need a refill on your cardiac medications before your next appointment, please call your pharmacy.

## 2016-07-09 ENCOUNTER — Ambulatory Visit: Payer: Medicare Other | Admitting: Cardiology

## 2016-07-09 ENCOUNTER — Other Ambulatory Visit: Payer: Medicare Other

## 2016-07-13 NOTE — Progress Notes (Deleted)
  ROS ] 

## 2016-07-16 ENCOUNTER — Ambulatory Visit (HOSPITAL_COMMUNITY): Payer: BLUE CROSS/BLUE SHIELD | Attending: Cardiovascular Disease

## 2016-07-16 ENCOUNTER — Encounter (INDEPENDENT_AMBULATORY_CARE_PROVIDER_SITE_OTHER): Payer: Self-pay

## 2016-07-16 ENCOUNTER — Other Ambulatory Visit: Payer: Self-pay | Admitting: *Deleted

## 2016-07-16 ENCOUNTER — Ambulatory Visit (INDEPENDENT_AMBULATORY_CARE_PROVIDER_SITE_OTHER): Payer: BLUE CROSS/BLUE SHIELD | Admitting: Nurse Practitioner

## 2016-07-16 ENCOUNTER — Ambulatory Visit: Payer: Medicare Other | Admitting: Physician Assistant

## 2016-07-16 ENCOUNTER — Encounter: Payer: Self-pay | Admitting: Nurse Practitioner

## 2016-07-16 ENCOUNTER — Other Ambulatory Visit: Payer: Self-pay

## 2016-07-16 VITALS — BP 120/76 | HR 75 | Ht 61.0 in | Wt 186.8 lb

## 2016-07-16 DIAGNOSIS — J189 Pneumonia, unspecified organism: Secondary | ICD-10-CM | POA: Insufficient documentation

## 2016-07-16 DIAGNOSIS — I110 Hypertensive heart disease with heart failure: Secondary | ICD-10-CM | POA: Insufficient documentation

## 2016-07-16 DIAGNOSIS — C3490 Malignant neoplasm of unspecified part of unspecified bronchus or lung: Secondary | ICD-10-CM | POA: Diagnosis not present

## 2016-07-16 DIAGNOSIS — J449 Chronic obstructive pulmonary disease, unspecified: Secondary | ICD-10-CM

## 2016-07-16 DIAGNOSIS — I5032 Chronic diastolic (congestive) heart failure: Secondary | ICD-10-CM | POA: Diagnosis not present

## 2016-07-16 DIAGNOSIS — Z85118 Personal history of other malignant neoplasm of bronchus and lung: Secondary | ICD-10-CM

## 2016-07-16 DIAGNOSIS — I10 Essential (primary) hypertension: Secondary | ICD-10-CM

## 2016-07-16 DIAGNOSIS — J440 Chronic obstructive pulmonary disease with acute lower respiratory infection: Secondary | ICD-10-CM | POA: Diagnosis not present

## 2016-07-16 DIAGNOSIS — E7800 Pure hypercholesterolemia, unspecified: Secondary | ICD-10-CM | POA: Insufficient documentation

## 2016-07-16 DIAGNOSIS — Z6835 Body mass index (BMI) 35.0-35.9, adult: Secondary | ICD-10-CM | POA: Insufficient documentation

## 2016-07-16 DIAGNOSIS — I313 Pericardial effusion (noninflammatory): Secondary | ICD-10-CM

## 2016-07-16 DIAGNOSIS — I3139 Other pericardial effusion (noninflammatory): Secondary | ICD-10-CM

## 2016-07-16 DIAGNOSIS — E669 Obesity, unspecified: Secondary | ICD-10-CM | POA: Diagnosis not present

## 2016-07-16 DIAGNOSIS — I2721 Secondary pulmonary arterial hypertension: Secondary | ICD-10-CM | POA: Insufficient documentation

## 2016-07-16 DIAGNOSIS — R0602 Shortness of breath: Secondary | ICD-10-CM

## 2016-07-16 LAB — ECHOCARDIOGRAM LIMITED
Height: 61 in
Weight: 2988.8 oz

## 2016-07-16 MED ORDER — POTASSIUM CHLORIDE ER 10 MEQ PO TBCR: 40.0000 meq | EXTENDED_RELEASE_TABLET | Freq: Every day | ORAL | 6 refills | Status: DC

## 2016-07-16 MED ORDER — METOLAZONE 2.5 MG PO TABS: 2.5000 mg | ORAL_TABLET | Freq: Every day | ORAL | 0 refills | Status: DC

## 2016-07-16 NOTE — Patient Instructions (Addendum)
We will be checking the following labs today - BMET, CBC, BNP   Medication Instructions:    Continue with your current medicines. BUT  Do not take Naproxen or other NSAIDs at this time  Stay on the increased dose of Lasix 40 mg to take twice a day  I want you to take your Potassium but I am increasing it to 4 pills each day until seen back  I am adding Zaroxolyn 2.5 mg to take daily for 3 days only - about 1/2 hour prior to your dose of Lasix and then STOP     Testing/Procedures To Be Arranged:  N/A  Follow-Up:   Referral made to Dr. Ashok Cordia with Pulmonary  Referral made to Dr. [name] with Oncology - she has seen before - history of lung cancer/abnormal CT scan  See me on Monday    Other Special Instructions:   Arranging for oxygen to be sent to your house.   If you get any worse, you have to go to the hospital    If you need a refill on your cardiac medications before your next appointment, please call your pharmacy.   Call the [place] office at [phone] if you have any questions, problems or concerns.

## 2016-07-16 NOTE — Progress Notes (Signed)
CARDIOLOGY OFFICE NOTE  Date:  07/16/2016    [name] Date of Birth: [birth year] Medical Record #[id number]  PCP:  [name], MD  Cardiologist:  [name]    Chief Complaint  Patient presents with  . Congestive Heart Failure    Follow up visit - seen for [name]    History of Present Illness: Amy Shepard is a 71 y.o. female who presents today for a post hospital visit. Seen for [name].   She has had prior lung cancer, has COPD, depression, DM, and known coronary calcification. Has had prior cardiac catheterization showing nonobstructive disease with a 40 to 60% LAD - risk factor modification was recommended. She had been released from Dr. [name] with oncology.   Seen back in July of 2017 after a 2 year absence - needing surgical clearance for lumbar fusion - EKG was abnormal - Myoview updated and this turned out ok - cleared for surgery.   Post op did ok but presented with shortness of breath and hypoxia on her follow up and got readmitted. This was felt to be multifactorial with COPD exacerbation and diastolic HF. Echo with normal EF. CT negative for PE but with new mediastinal adenopathy - recommend repeat study in 4 to 6 weeks.   I have seen her back again in September. Her cardiac status seemed stable. Since then, she has had several admissions for recurrent COPD exacerbation/diastolic HF. Saw [name] in mid December for another post hospital visit and was felt to have some volume overload. Looks like her last CT of the chest from October showed an enlarging pericardial effusion and continued mediastinal adenopathy - PET CT was recommended - does not look like this was done.   Comes in today. Here alone. Very short of breath with coming into the office. Oxygen level down to 77% upon entering the exam room. Notes that she has had continued dyspnea with minimal activity - says she has been this way "for quite a while". Has a cold as well. Says she  was like this at her last visit - was to have been seen back last week but had to cancel. Still with swelling - it is no better. Weight is up but is unchanged by our scales. Tells me that there was no further treatment recommended for her last CT scan - she does not seem to be aware of any effusion. She is really struggling at home - having to actually crawl up the stairs. She is surprised that she does not have oxygen at home. She does not see pulmonary.   Past Medical History:  Diagnosis Date  . Arthritis   . Cancer ([place])    lung ca  . CHF (congestive heart failure) ([place]) 02/2016  . COPD (chronic obstructive pulmonary disease) ([facility] [place])   . Depression   . Diabetes mellitus    [name]    dx  2008  . Hx of seasonal allergies   . Hypercholesteremia   . Hypertension    on no medications  . Lung cancer ([place])   . Pneumonia    2008  @  [name]  . Shortness of breath dyspnea    doe    Past Surgical History:  Procedure Laterality Date  . [facility] in [place]     bilateral cataracts  . LEFT HEART CATHETERIZATION WITH CORONARY ANGIOGRAM N/A 07/12/2014   Procedure:  LEFT HEART CATHETERIZATION WITH CORONARY ANGIOGRAM;  Surgeon: [name], MD;  Location: [facility] CATH LAB;  Service: Cardiovascular;  Laterality: N/A;  . MAXIMUM ACCESS (MAS)POSTERIOR LUMBAR INTERBODY FUSION (PLIF) 2 LEVEL N/A 02/22/2016   Procedure: Lumbar three-four - Lumbar four-five  MAXIMUM ACCESS SURGERY  POSTERIOR LUMBAR INTERBODY FUSION;  Surgeon: [name], MD;  Location: [facility];  Service: Neurosurgery;  Laterality: N/A;  . THORACOTOMY Right 2010   lower  . TONSILLECTOMY       Medications: Current Outpatient Prescriptions  Medication Sig Dispense Refill  . aspirin EC 81 MG tablet Take 81 mg by mouth every evening.    [name] atorvastatin (LIPITOR) 20 MG tablet Take 20 mg by mouth daily.    . celecoxib (CELEBREX) 100 MG capsule Take 1 capsule (100  mg total) by mouth daily as needed for mild pain.  1  . Fluticasone-Salmeterol (ADVAIR) 250-50 MCG/DOSE AEPB Inhale 1 puff into the lungs 2 (two) times daily.    . furosemide (LASIX) 40 MG tablet Take 1 tablet (40 mg total) by mouth 2 (two) times daily. Take one dose at 8 am and then take the 2nd dose at 2 pm every day 180 tablet 2  . gabapentin (NEURONTIN) 300 MG capsule Take 300 mg by mouth 3 (three) times daily.    [name] ipratropium-albuterol (DUONEB) 0.5-2.5 (3) MG/3ML SOLN Take 3 mLs by nebulization every 4 (four) hours as needed (wheezing, Shortness of breath). 360 mL 0  . Liraglutide (VICTOZA) 18 MG/3ML SOPN Inject 1.8 mg into the skin daily.    . naproxen sodium (ANAPROX) 220 MG tablet Take 440 mg by mouth 2 (two) times daily as needed (for pain.).    [name] nitrofurantoin, macrocrystal-monohydrate, (MACROBID) 100 MG capsule Take 1 capsule (100 mg total) by mouth every 12 (twelve) hours. 8 capsule 0  . potassium chloride (K-DUR) 10 MEQ tablet Take 1 tablet (10 mEq total) by mouth daily. (Patient not taking: Reported on 07/01/2016) 30 tablet 0  . sertraline (ZOLOFT) 100 MG tablet Take 150 mg by mouth daily.     . sitaGLIPtin-metformin (JANUMET) 50-1000 MG per tablet Take 1 tablet by mouth 2 (two) times daily with a meal.     No current facility-administered medications for this visit.    Facility-Administered Medications Ordered in Other Visits  Medication Dose Route Frequency Provider Last Rate Last Dose  . diatrizoate meglumine-sodium (GASTROGRAFIN) 66-10 % solution 30 mL  30 mL Oral PRN Bjorn Loser, MD   30 mL at 11/30/15 0820    Allergies: Allergies  Allergen Reactions  . Amoxicillin Anaphylaxis, Hives and Rash    Has patient had a PCN reaction causing immediate rash, facial/tongue/throat swelling, SOB or lightheadedness with hypotension: YES Positive reaction causing SEVERE RASH INVOLVING MUCUS MEMBRANES/SKIN NECROSIS: YES Reaction that required HOSPITALIZATION: YES Reaction  occurring within the last 10 years: NO    Social History: The patient  reports that she quit smoking about 10 years ago. She has a 30.00 pack-year smoking history. She has never used smokeless tobacco. She reports that she does not drink alcohol or use drugs.   Family History: The patient's family history includes Cancer in her sister; Diabetes in her brother and father; Heart failure in her brother and father.   Review of Systems: Please see the history of present illness.   Otherwise, the review of systems is positive for none.   All other systems are reviewed and negative.   Physical Exam: VS:  BP 120/76  Pulse 75   Ht _0  (1.549 m)   Wt 186 lb 12.8 oz (84.7 kg)   SpO2 (!) 79%   BMI 35.30 kg/m  .  BMI Body mass index is 35.3 kg/m.  Wt Readings from Last 3 Encounters:  07/16/16 186 lb 12.8 oz (84.7 kg)  07/01/16 185 lb (83.9 kg)  06/25/16 186 lb 6.4 oz (84.6 kg)    On arrival to the office her oxygen saturation was 77% on RA. Down to 71% on RA at rest in the exam room. With placing oxygen at 2 liters her saturation came up to 97%. Her oxygen was cut back to 1 liter with saturation of 93%.   General: Very short of breath with ambulation. Has to stop and rest. Placed on 2 liters of oxygen with sats up to the low 90's.    HEENT: Normal.  Neck: Supple, no JVD, carotid bruits, or masses noted.  Cardiac: Regular rate and rhythm. No murmurs, rubs, or gallops. 2+ edema.  Respiratory:  Lungs with decreased breath sounds - some crackles and with increased work of breathing. She improved with the addition of oxygen. GI: Soft and nontender.  MS: No deformity or atrophy. Gait and ROM intact but she has to stop frequently due to shortness of breath/hypoxia. She is using a walker.  Skin: Warm and dry. Color is normal.  Neuro:  Strength and sensation are intact and no gross focal deficits noted.  Psych: Alert, appropriate and with normal affect.   LABORATORY DATA:  EKG:  EKG is not  ordered today.  Lab Results  Component Value Date   WBC 5.9 06/23/2016   HGB 12.1 06/23/2016   HCT 38.9 06/23/2016   PLT 167 06/23/2016   GLUCOSE 199 (H) 06/25/2016   ALT 31 06/23/2016   AST 29 06/23/2016   NA 139 06/25/2016   K 3.9 06/25/2016   CL 102 06/25/2016   CREATININE 0.95 06/25/2016   BUN 24 (H) 06/25/2016   CO2 28 06/25/2016   INR 1.00 02/01/2016   HGBA1C 11.6 (H) 05/15/2016    BNP (last 3 results)  Recent Labs  03/10/16 1832 03/28/16 1504 06/23/16 1702  BNP 167.6* 93.2 435.9*    ProBNP (last 3 results) No results for input(s): PROBNP in the last 8760 hours.   Other Studies Reviewed Today:  Echo Study Conclusions from 02/2016  - Left ventricle: The cavity size was normal. Wall thickness was   increased in a pattern of mild LVH. Systolic function was normal.   The estimated ejection fraction was in the range of 60% to 65%.   Wall motion was normal; there were no regional wall motion   abnormalities. Features are consistent with a pseudonormal left   ventricular filling pattern, with concomitant abnormal relaxation   and increased filling pressure (grade 2 diastolic dysfunction).   CT CHEST IMPRESSION 04/2016: Increasing small to moderate pericardial effusion.  Similar mediastinal lymphadenopathy which may be reactive though, again given patient's history of lung cancer, consider PET-CT.  Small RIGHT and trace LEFT residual pleural effusions with finding of chronic CHF.  Status post RIGHT thoracotomy and upper lobectomy.   Electronically Signed   By: Elon Alas M.D.   On: 05/01/2016 15:21    Myoview Study Highlights from 01/2016    Nuclear stress EF: 76%. No wall motion abnormalities  There was no ST segment deviation noted during stress.  Defect 1: There is a small defect of mild severity present in the apex location. No ischemia  identified  This is a low risk study.  Candee Furbish, MD      ANGIOGRAPHIC DATA  FROM 06/2014: The left main coronary artery is widely patent.  The left anterior descending artery is heavily calcified in the proximal and mid vessel. There is eccentric 40% proximal narrowing. There is 50-60% mid narrowing after the origin of the large diagonal. No hemodynamically significant obstruction is noted in the LAD.  The left circumflex artery is widely patent. There is moderate calcification in the mid and distal vessel. There is 3 obtuse marginal branches. The second marginal is a dominant vessel that contains ectasia proximally but is otherwise normal.  The right coronary arterydominant and widely patent. Luminal irregularities are noted in the mid and distal vessel. [name]   LEFT VENTRICULOGRAM:Left ventricular angiogram was done in the 30 RAO projection and revealed normal LV cavity size and EF of 60%   IMPRESSIONS:1. Nonobstructive coronary artery disease 2. Three-vessel coronary calcification 3. Normal left ventricular systolic function   RECOMMENDATION:Risk factor modification.    Assessment/Plan:  1. 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 I do not understand how she is even able to work in her current condition. She wants to be treated at home. Therefore, I am going to give her 3 days of Zaroxolyn. Increasing Potassium as well. Referring to pulmonary and oncology. I think she needs a PET CT but will defer to Dr. [name]. If she fails to improve I have been pretty explicit with recommendation to be readmitted. I will tentatively see her back on Monday for a recheck.   2. Known coronary disease - no active chest pain at this time.   3. Abnormal chest CT - with continued adenopathy and pericardial effusion - needs limited echo and probable PET  CT as recommended.  Arranging for limited echo now. See #1 as well.   4. HLD - should be on statin - not clarified today.   5. History of lung cancer with adenopathy on recent CT with recommended PET study - will get her back to oncology.   6. Pericardial effusion - preliminary echo with small effusion noted - no tamponade.    Current medicines are reviewed with the patient today.  The patient does not have concerns regarding medicines other than what has been noted above.  The following changes have been made:  See above.  Labs/ tests ordered today include:   No orders of the defined types were placed in this encounter.    Disposition:   FU with me on Monday unless she is admitted beforehand.    Patient is agreeable to this plan and will call if any problems develop in the interim.   Signed: Burtis Junes, RN, ANP-C 07/16/2016 2:25 PM  [facility] [address] [place], [place]  [address] Phone: [phone] Fax: [phone]

## 2016-07-16 NOTE — Addendum Note (Signed)
Addended by: Burtis Junes on: 07/16/2016 03:29 PM   Modules accepted: Orders

## 2016-07-16 NOTE — Addendum Note (Signed)
Addended by: Eulis Foster on: 07/16/2016 03:28 PM   Modules accepted: Orders

## 2016-07-16 NOTE — Addendum Note (Signed)
Addended by: Burtis Junes on: 07/16/2016 03:20 PM   Modules accepted: Orders

## 2016-07-17 ENCOUNTER — Other Ambulatory Visit: Payer: Self-pay | Admitting: Nurse Practitioner

## 2016-07-17 ENCOUNTER — Telehealth: Payer: Self-pay | Admitting: *Deleted

## 2016-07-17 DIAGNOSIS — R0602 Shortness of breath: Secondary | ICD-10-CM

## 2016-07-17 DIAGNOSIS — I50810 Right heart failure, unspecified: Secondary | ICD-10-CM

## 2016-07-17 LAB — BASIC METABOLIC PANEL
BUN/Creatinine Ratio: 32 — ABNORMAL HIGH (ref 12–28)
BUN: 28 mg/dL — ABNORMAL HIGH (ref 8–27)
CO2: 25 mmol/L (ref 18–29)
Calcium: 9.3 mg/dL (ref 8.7–10.3)
Chloride: 99 mmol/L (ref 96–106)
Creatinine, Ser: 0.87 mg/dL (ref 0.57–1.00)
GFR calc Af Amer: 79 mL/min/{1.73_m2} (ref >59)
GFR calc non Af Amer: 68 mL/min/{1.73_m2} (ref >59)
Glucose: 204 mg/dL — ABNORMAL HIGH (ref 65–99)
Potassium: 4.3 mmol/L (ref 3.5–5.2)
Sodium: 142 mmol/L (ref 134–144)

## 2016-07-17 LAB — CBC
Hematocrit: 40.4 % (ref 34.0–46.6)
Hemoglobin: 12.6 g/dL (ref 11.1–15.9)
MCH: 26.6 pg (ref 26.6–33.0)
MCHC: 31.2 g/dL — ABNORMAL LOW (ref 31.5–35.7)
MCV: 85 fL (ref 79–97)
Platelets: 192 10*3/uL (ref 150–379)
RBC: 4.74 x10E6/uL (ref 3.77–5.28)
RDW: 15.5 % — ABNORMAL HIGH (ref 12.3–15.4)
WBC: 6.4 10*3/uL (ref 3.4–10.8)

## 2016-07-17 LAB — PRO B NATRIURETIC PEPTIDE: NT-Pro BNP: 2851 pg/mL — ABNORMAL HIGH (ref 0–301)

## 2016-07-17 NOTE — Telephone Encounter (Signed)
Thank you.

## 2016-07-17 NOTE — Telephone Encounter (Signed)
S/w pt is aware having CT on Monday, pt has to be here at 9:45 am with no solids two hours prior, only liquids. Pt is agreeable to treatment plan and is aware of time.  Will route to [place] to [place].

## 2016-07-17 NOTE — Telephone Encounter (Signed)
-----  Message from Burtis Junes, NP sent at 07/17/2016  8:19 AM EST ----- Dr. [name] called me last night about her echo = let's see if we can get a spiral CT of the chest to rule out PE on Monday when I see her - I am putting the order in.   [facility]

## 2016-07-17 NOTE — Progress Notes (Signed)
Received call last night from Dr. Sallyanne Kuster - reading MD for the echo - worsening right sided heart failure. Reviewed my plan of care with him. Will arrange for CT to rule out PE as well.   Burtis Junes, RN, [place] [address] [place], [place]  [address] [phone]

## 2016-07-17 NOTE — Telephone Encounter (Signed)
I already reminded pt if pt gets worse to call 911 or go to ER.  Pt responded appreciatively.Will route to [place] to [place].

## 2016-07-18 ENCOUNTER — Telehealth: Payer: Self-pay | Admitting: *Deleted

## 2016-07-18 NOTE — Telephone Encounter (Signed)
-----  Message from Burtis Junes, NP sent at 07/18/2016  9:54 AM EST ----- Andee Poles,  Please call and see if her family can help her out - she is not going to get any better if we can't get her on oxygen therapy.  Cecille Rubin ----- Message ----- From: Jiles Crocker Sent: 07/18/2016   9:37 AM To: Burtis Junes, NP  Hello! This pt has declined to take the o2 due to not wanting to pay her copay.  I just wanted to let you know.   Thank you! ----- Message ----- From: Burtis Junes, NP Sent: 07/16/2016   2:44 PM To: Melissa Stenson  Needing oxygen sent to this patient - see my note please.   Needs 1 liter for now.   Cecille Rubin

## 2016-07-21 ENCOUNTER — Telehealth: Payer: Self-pay | Admitting: *Deleted

## 2016-07-21 ENCOUNTER — Ambulatory Visit (INDEPENDENT_AMBULATORY_CARE_PROVIDER_SITE_OTHER): Payer: BLUE CROSS/BLUE SHIELD | Admitting: Nurse Practitioner

## 2016-07-21 ENCOUNTER — Encounter: Payer: Self-pay | Admitting: Nurse Practitioner

## 2016-07-21 ENCOUNTER — Ambulatory Visit (INDEPENDENT_AMBULATORY_CARE_PROVIDER_SITE_OTHER)
Admission: RE | Admit: 2016-07-21 | Discharge: 2016-07-21 | Disposition: A | Discharge status: Home / Self Care | Payer: BLUE CROSS/BLUE SHIELD | Source: Ambulatory Visit | Attending: Nurse Practitioner | Admitting: Nurse Practitioner

## 2016-07-21 VITALS — BP 110/68 | HR 76 | Ht 61.0 in | Wt 177.8 lb

## 2016-07-21 DIAGNOSIS — I5032 Chronic diastolic (congestive) heart failure: Secondary | ICD-10-CM | POA: Diagnosis not present

## 2016-07-21 DIAGNOSIS — I10 Essential (primary) hypertension: Secondary | ICD-10-CM | POA: Diagnosis not present

## 2016-07-21 DIAGNOSIS — I5033 Acute on chronic diastolic (congestive) heart failure: Secondary | ICD-10-CM | POA: Diagnosis not present

## 2016-07-21 DIAGNOSIS — R0902 Hypoxemia: Secondary | ICD-10-CM

## 2016-07-21 DIAGNOSIS — R0602 Shortness of breath: Secondary | ICD-10-CM

## 2016-07-21 DIAGNOSIS — I50810 Right heart failure, unspecified: Secondary | ICD-10-CM

## 2016-07-21 MED ORDER — IOPAMIDOL (ISOVUE-370) INJECTION 76%
80.0000 mL | Freq: Once | INTRAVENOUS | Status: AC | PRN
  Administered 2016-07-21: 80 mL via INTRAVENOUS

## 2016-07-21 NOTE — Telephone Encounter (Signed)
Melissa Stenson from [facility] called back about  pt's oxygen.  Stated pt's insurance is not in network. Will research more and get back to [place].  Stated pt could not afford 180.00 a month. Lori contacted [employer] stated would help pt.

## 2016-07-21 NOTE — Progress Notes (Signed)
CARDIOLOGY OFFICE NOTE  Date:  07/21/2016    Amy Shepard Date of Birth: [birth year] Medical Record #[id number]  PCP:  Amy Bellows, MD  Cardiologist:  Amy Shepard  Chief Complaint  Patient presents with  . Shortness of Breath    Follow up visit - seen for Amy Shepard    History of Present Illness: Amy Shepard is a 71 y.o. female who presents today for a follow up visit. Seen for Amy Shepard.   She has had prior lung cancer, has COPD, depression, DM, and known coronary calcification. Has had prior cardiac catheterization showing nonobstructive disease with a 40 to 60% LAD - risk factor modification was recommended. She had been released from [facility] with oncology.   Seen back in July of 2017 after a 2 year absence - needing surgical clearance for lumbar fusion - EKG was abnormal - Myoview updated and this turned out ok - cleared for surgery.   Post op did ok but presented with shortness of breath and hypoxia on her follow up and got readmitted. This was felt to be multifactorial with COPD exacerbation and diastolic HF. Echo with normal EF. CT negative for PE but with new mediastinal adenopathy - recommend repeat study in 4 to 6 weeks.   I saw her back again in September. Her cardiac status seemed stable. Since then, she has had several admissions for recurrent COPD exacerbation/diastolic HF. Saw Amy [facility]pard in mid December for another post hospital visit and was felt to have some volume overload. Looks like her last CT of the chest from October showed an enlarging pericardial effusion and continued mediastinal adenopathy - PET CT was recommended - does not look like this was done.   I then saw her last week - she was very hypoxic - sats down to 77%. More swelling. Weight was up. Refused admission. Noted the pericardial effusion and got a limited echo - reviewed by Dr. Loletha Shepard - worrisome for worsening right heart failure. I diuresed her, arranged pulmonary and  oncology follow up. CT planned for earlier this morning. She refused her oxygen at home due to the copay.   Comes in today. Here alone. She has had her CT scan - oxygen sats down in the 60's and low 70's at RA here with basically no exertion - she is in a wheelchair. She tells me that she would have to pay 180 dollars each month for the oxygen. Her daughter's car is not working now and it is not paid for. She does say that she feels better. Has lost 8 pounds. Little dizzy. Swelling not gone but improved.  Took only 3 days of Zaroxolyn. Seeing pulmonary and oncology later this week. CT this morning without PE.   Past Medical History:  Diagnosis Date  . Arthritis   . Cancer ([place])    lung ca  . CHF (congestive heart failure) ([place]) 02/2016  . COPD (chronic obstructive pulmonary disease) ([place])   . Depression   . Diabetes mellitus    [name]    dx  2008  . Hx of seasonal allergies   . Hypercholesteremia   . Hypertension    on no medications  . Lung cancer ([place])   . Pneumonia    2008  @  [name]  . Shortness of breath dyspnea    doe    Past Surgical History:  Procedure Laterality Date  . [facility] in [place]     bilateral cataracts  . LEFT HEART CATHETERIZATION WITH CORONARY ANGIOGRAM N/A 07/12/2014   Procedure: LEFT HEART CATHETERIZATION WITH CORONARY ANGIOGRAM;  Surgeon: Amy Grooms, MD;  Location: [facility] CATH LAB;  Service: Cardiovascular;  Laterality: N/A;  . MAXIMUM ACCESS (MAS)POSTERIOR LUMBAR INTERBODY FUSION (PLIF) 2 LEVEL N/A 02/22/2016   Procedure: Lumbar three-four - Lumbar four-five  MAXIMUM ACCESS SURGERY  POSTERIOR LUMBAR INTERBODY FUSION;  Surgeon: Amy Moore, MD;  Location: [facility]URO ORS;  Service: Neurosurgery;  Laterality: N/A;  . THORACOTOMY Right 2010   lower  . TONSILLECTOMY       Medications: Current Outpatient Prescriptions  Medication Sig Dispense Refill  . aspirin EC 81 MG tablet Take  81 mg by mouth every evening.    [name] atorvastatin (LIPITOR) 20 MG tablet Take 20 mg by mouth daily.    . celecoxib (CELEBREX) 100 MG capsule Take 1 capsule (100 mg total) by mouth daily as needed for mild pain.  1  . Fluticasone-Salmeterol (ADVAIR) 250-50 MCG/DOSE AEPB Inhale 1 puff into the lungs 2 (two) times daily.    . furosemide (LASIX) 40 MG tablet Take 1 tablet (40 mg total) by mouth 2 (two) times daily. Take one dose at 8 am and then take the 2nd dose at 2 pm every day 180 tablet 2  . gabapentin (NEURONTIN) 300 MG capsule Take 300 mg by mouth 3 (three) times daily.    [name] GLIPIZIDE XL 5 MG 24 hr tablet Take 5 mg by mouth every morning.  3  . ipratropium-albuterol (DUONEB) 0.5-2.5 (3) MG/3ML SOLN Take 3 mLs by nebulization every 4 (four) hours as needed (wheezing, Shortness of breath). 360 mL 0  . Liraglutide (VICTOZA) 18 MG/3ML SOPN Inject 1.8 mg into the skin daily.    . potassium chloride (K-DUR) 10 MEQ tablet Take 4 tablets (40 mEq total) by mouth daily. 60 tablet 6  . sertraline (ZOLOFT) 100 MG tablet Take 150 mg by mouth daily.     . sitaGLIPtin-metformin (JANUMET) 50-1000 MG per tablet Take 1 tablet by mouth 2 (two) times daily with a meal.     No current facility-administered medications for this visit.    Facility-Administered Medications Ordered in Other Visits  Medication Dose Route Frequency Provider Last Rate Last Dose  . diatrizoate meglumine-sodium (GASTROGRAFIN) 66-10 % solution 30 mL  30 mL Oral PRN Amy Loser, MD   30 mL at 11/30/15 0820    Allergies: Allergies  Allergen Reactions  . Amoxicillin Anaphylaxis, Hives and Rash    Has patient had a PCN reaction causing immediate rash, facial/tongue/throat swelling, SOB or lightheadedness with hypotension: YES Positive reaction causing SEVERE RASH INVOLVING MUCUS MEMBRANES/SKIN NECROSIS: YES Reaction that required HOSPITALIZATION: YES Reaction occurring within the last 10 years: NO    Social History: The patient   reports that she quit smoking about 10 years ago. She has a 30.00 pack-year smoking history. She has never used smokeless tobacco. She reports that she does not drink alcohol or use drugs.   Family History: The patient's family history includes Cancer in her sister; Diabetes in her brother and father; Heart failure in her brother and father.   Review of Systems: Please see the history of present illness.   Otherwise, the review of systems is positive for none.   All other systems are reviewed and negative.   Physical Exam: VS:  BP 110/68   Pulse 76   Ht _0  (  1.549 m)   Wt 177 lb 12.8 oz (80.6 kg)   SpO2 (!) 80% Comment: at rest  BMI 33.60 kg/m  .  BMI Body mass index is 33.6 kg/m.  Wt Readings from Last 3 Encounters:  07/21/16 177 lb 12.8 oz (80.6 kg)  07/16/16 186 lb 12.8 oz (84.7 kg)  07/01/16 185 lb (83.9 kg)    General: Pleasant. Her weight is down 9 pounds. Looks chronically ill. Remains hypoxic.  HEENT: Normal. Cheeks bluish.  Neck: Supple, no JVD, carotid bruits, or masses noted.  Cardiac: Regular rate and rhythm. No murmurs, rubs, or gallops. +1 edema.  Respiratory:  Lungs are fairly clear to auscultation bilaterally with normal work of breathing but has decreased breath sounds.  GI: Soft and nontender.  MS: No deformity or atrophy. Gait not tested today - she was left in the wheelchair. Skin: Warm and dry. Color is normal.  Neuro:  Strength and sensation are intact and no gross focal deficits noted.  Psych: Alert, appropriate and with normal affect.   LABORATORY DATA:  EKG:  EKG is not ordered today.  Lab Results  Component Value Date   WBC 6.4 07/16/2016   HGB 12.1 06/23/2016   HCT 40.4 07/16/2016   PLT 192 07/16/2016   GLUCOSE 204 (H) 07/16/2016   ALT 31 06/23/2016   AST 29 06/23/2016   NA 142 07/16/2016   K 4.3 07/16/2016   CL 99 07/16/2016   CREATININE 0.87 07/16/2016   BUN 28 (H) 07/16/2016   CO2 25 07/16/2016   INR 1.00 02/01/2016   HGBA1C 11.6  (H) 05/15/2016    BNP (last 3 results)  Recent Labs  03/10/16 1832 03/28/16 1504 06/23/16 1702  BNP 167.6* 93.2 435.9*    ProBNP (last 3 results)  Recent Labs  07/16/16 1529  PROBNP 2,851*     Other Studies Reviewed Today:  Limited Echo Study Conclusions from 07/16/16  - Left ventricle: The cavity size was normal. Wall thickness was   normal. Systolic function was normal. The estimated ejection   fraction was in the range of 55% to 60%. Doppler parameters are   consistent with abnormal left ventricular relaxation (grade 1   diastolic dysfunction). Doppler parameters are consistent with   elevated mean left atrial filling pressure. - Ventricular septum: The contour showed diastolic flattening and   systolic flattening. These changes are consistent with RV volume   and pressure overload. - Left atrium: The atrium was mildly dilated. - Right ventricle: The cavity size was moderately dilated. Systolic   function was moderately reduced. - Right atrium: The atrium was moderately dilated. - Atrial septum: No defect or patent foramen ovale was identified. - Tricuspid valve: There was mild-moderate regurgitation directed   centrally. - Pulmonary arteries: Systolic pressure was moderately to severely   increased. PA peak pressure: 75 mm Hg (S). - Pericardium, extracardiac: A small to moderate pericardial   effusion was identified circumferential to the heart.  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 Consider interval pulmonary embolism or other   causes of worsening cor pulmonale.   Echo Study Conclusions from 02/2016  - Left ventricle: The cavity size was normal. Wall thickness was increased in a pattern of mild LVH. Systolic  function was normal. The estimated  ejection fraction was in the range of 60% to 65%. Wall motion was normal; there were no regional wall motion abnormalities. Features are consistent with a pseudonormal left ventricular filling pattern, with concomitant abnormal relaxation and increased filling pressure (grade 2 diastolic dysfunction).   CT ANGIO CHEST IMPRESSION 07/21/16: 1. Evidence of right heart failure with dilated right-sided chambers and reflux of contrast into the IVC and hepatic veins. No evidence of overt pulmonary airspace edema. 2. Stable small pericardial effusion. 3. Stable small scattered mediastinal lymph nodes. 4. Small right pleural effusion. 5. Coronary atherosclerosis with calcified plaque in a 3 vessel distribution.   Electronically Signed   By: Aletta Edouard M.D.   On: 07/21/2016 10:33   CT CHEST IMPRESSION 04/2016: Increasing small to moderate pericardial effusion.  Similar mediastinal lymphadenopathy which may be reactive though, again given patient's history of lung cancer, consider PET-CT.  Small RIGHT and trace LEFT residual pleural effusions with finding of chronic CHF.  Status post RIGHT thoracotomy and upper lobectomy.   Electronically Signed By: Elon Alas M.D. On: 05/01/2016 15:21   Myoview Study Highlights from 01/2016    Nuclear stress EF: 76%. No wall motion abnormalities  There was no ST segment deviation noted during stress.  Defect 1: There is a small defect of mild severity present in the apex location. No ischemia identified  This is a low risk study.  Candee Furbish, MD     ANGIOGRAPHIC DATA FROM 06/2014: The left main coronary artery is widely patent.  The left anterior descending artery is heavily calcified in the proximal and mid vessel. There is eccentric 40% proximal narrowing. There is 50-60% mid narrowing after the origin of the large diagonal. No hemodynamically  significant obstruction is noted in the LAD.  The left circumflex artery is widely patent. There is moderate calcification in the mid and distal vessel. There is 3 obtuse marginal branches. The second marginal is a dominant vessel that contains ectasia proximally but is otherwise normal.  The right coronary arterydominant and widely patent. Luminal irregularities are noted in the mid and distal vessel. [name]   LEFT VENTRICULOGRAM:Left ventricular angiogram was done in the 30 RAO projection and revealed normal LV cavity size and EF of 60%   IMPRESSIONS:1. Nonobstructive coronary artery disease 2. Three-vessel coronary calcification 3. Normal left ventricular systolic function   RECOMMENDATION:Risk factor modification.    Assessment/Plan:  1. Recurrent COPD/diastolic HF exacerbations - worsening right heart failure/hypoxia - refused admission last week - cannot afford the copay for oxygen with [facility] - I have called [facility] - they will see if they can help arrange her oxygen. Oxygen therapy is imperative going forward - seeing pulmonary and oncology later this week. I have diuresed her - needs repeat labs today - her weight is down. No more Zaroxolyn. Will leave on the Lasix 40 mg BID for now. Lab today.   2. Known coronary disease - no active chest pain at this time.   3. Abnormal chest CT - with continued adenopathy and pericardial effusion - may need probable PET CT as recommended. Will defer to oncology - sees him later this week.   4. HLD - should be on statin - not clarified today.   5. History of lung cancer with adenopathy on recent CT with recommended PET study - will get her back to oncology - seeing him later this week.   6. Pericardial effusion - small by recent echo/CT - would just follow.    Current medicines are reviewed  with the patient today.  The patient does not have concerns regarding medicines other than what has been noted above.  The  following changes have been made:  See above.  Labs/ tests ordered today include:    Orders Placed This Encounter  Procedures  . For home use only DME oxygen  . Basic metabolic panel  . Pro b natriuretic peptide (BNP)     Disposition:   FU with me next week. Overall situation quite tenuous at best.    Patient is agreeable to this plan and will call if any problems develop in the interim.   Signed: Burtis Junes, RN, ANP-C 07/21/2016 12:09 PM  [facility] [address] [place], [place]  [address] Phone: [phone] Fax: [phone]

## 2016-07-21 NOTE — Patient Instructions (Addendum)
We will be checking the following labs today - BMET and pro BNP  Medication Instructions:    Continue with your current medicines.   Stay on the Furosemide twice a day     Testing/Procedures To Be Arranged:  Your CT scan from this morning does NOT show blood clots - this is good.   Follow-Up:   See pulmonary and oncology this week as planned.  See me back week after next    Other Special Instructions:   I have called [facility] to try and get your oxygen arranged    If you need a refill on your cardiac medications before your next appointment, please call your pharmacy.   Call the [place] office at [phone] if you have any questions, problems or concerns.

## 2016-07-22 ENCOUNTER — Telehealth: Payer: Self-pay | Admitting: *Deleted

## 2016-07-22 ENCOUNTER — Telehealth: Payer: Self-pay | Admitting: Internal Medicine

## 2016-07-22 ENCOUNTER — Other Ambulatory Visit: Payer: Self-pay | Admitting: Medical Oncology

## 2016-07-22 LAB — SPECIMEN STATUS

## 2016-07-22 LAB — BASIC METABOLIC PANEL
BUN/Creatinine Ratio: 31 — ABNORMAL HIGH (ref 12–28)
BUN: 34 mg/dL — ABNORMAL HIGH (ref 8–27)
CO2: 28 mmol/L (ref 18–29)
Calcium: 9.1 mg/dL (ref 8.7–10.3)
Chloride: 91 mmol/L — ABNORMAL LOW (ref 96–106)
Creatinine, Ser: 1.09 mg/dL — ABNORMAL HIGH (ref 0.57–1.00)
GFR calc Af Amer: 60 mL/min/{1.73_m2} (ref >59)
GFR calc non Af Amer: 52 mL/min/{1.73_m2} — ABNORMAL LOW (ref >59)
Glucose: 199 mg/dL — ABNORMAL HIGH (ref 65–99)
Potassium: 4.2 mmol/L (ref 3.5–5.2)
Sodium: 138 mmol/L (ref 134–144)

## 2016-07-22 LAB — PRO B NATRIURETIC PEPTIDE: NT-Pro BNP: 2696 pg/mL — ABNORMAL HIGH (ref 0–301)

## 2016-07-22 NOTE — Telephone Encounter (Signed)
S/w pt [facility] did come to pt's house last night but was to late was told to come back this am . Still cannot afford 02 stated was 174.00 per month.  Will try [employer] of 02 and will call pt back.

## 2016-07-22 NOTE — Telephone Encounter (Signed)
Unable to reach pt to inform her that labs for 1/10 has be cxl by desk nurse due to pt having labs done on 1/8

## 2016-07-23 ENCOUNTER — Other Ambulatory Visit: Payer: Medicare Other

## 2016-07-23 ENCOUNTER — Ambulatory Visit (HOSPITAL_BASED_OUTPATIENT_CLINIC_OR_DEPARTMENT_OTHER): Payer: BLUE CROSS/BLUE SHIELD | Admitting: Internal Medicine

## 2016-07-23 ENCOUNTER — Telehealth: Payer: Self-pay | Admitting: *Deleted

## 2016-07-23 ENCOUNTER — Encounter: Payer: Self-pay | Admitting: Internal Medicine

## 2016-07-23 ENCOUNTER — Institutional Professional Consult (permissible substitution): Payer: Medicare Other | Admitting: Pulmonary Disease

## 2016-07-23 ENCOUNTER — Telehealth: Payer: Self-pay | Admitting: Internal Medicine

## 2016-07-23 VITALS — BP 104/56 | HR 80 | Temp 97.8°F | Resp 18 | Ht 61.0 in | Wt 178.6 lb

## 2016-07-23 DIAGNOSIS — C3431 Malignant neoplasm of lower lobe, right bronchus or lung: Secondary | ICD-10-CM

## 2016-07-23 DIAGNOSIS — I5033 Acute on chronic diastolic (congestive) heart failure: Secondary | ICD-10-CM

## 2016-07-23 DIAGNOSIS — Z85118 Personal history of other malignant neoplasm of bronchus and lung: Secondary | ICD-10-CM | POA: Diagnosis not present

## 2016-07-23 DIAGNOSIS — I509 Heart failure, unspecified: Secondary | ICD-10-CM

## 2016-07-23 DIAGNOSIS — J449 Chronic obstructive pulmonary disease, unspecified: Secondary | ICD-10-CM

## 2016-07-23 NOTE — Telephone Encounter (Signed)
Faxed to [facility] certificate of medical necessity for oxygen @ [phone]

## 2016-07-23 NOTE — Telephone Encounter (Signed)
Appointments scheduled per 1/10 LOS. Patient given AVS report and calendars with future scheduled appointments.

## 2016-07-23 NOTE — Progress Notes (Signed)
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   PRIOR THERAPY:  1. Status post right lower lobe superior segmentectomy with lymph node dissection under the care of Dr. Arlyce Dice on July 20, 2008. 2. Status post adjuvant chemotherapy with carboplatin and etoposide for a total of 4 cycles. Last dose was given 10/25/2008.  CURRENT THERAPY: Observation.  CHEMOTHERAPY INTENT: Curative  CURRENT # OF CHEMOTHERAPY CYCLES: 0  CURRENT ANTIEMETICS: None  CURRENT SMOKING STATUS: Former smoker quit in 2008  ORAL CHEMOTHERAPY AND CONSENT: None  CURRENT BISPHOSPHONATES USE: None  PAIN MANAGEMENT: 0/10  NARCOTICS INDUCED CONSTIPATION: None  LIVING WILL AND CODE STATUS: No code Blue.   INTERVAL HISTORY: Amy Shepard 71 y.o. female came to the clinic today for follow-up visit. The patient is feeling fine today except for the baseline shortness breath and her oxygen saturation was 80% at room air. She was started on home oxygen but unfortunately her batteries were dated and she is discharging in her oxygen tank at home. She came to the clinic today on oxygen. She denied having any chest pain but continues to have cough with no hemoptysis. She was diagnosed with congestive heart failure as well as COPD. She is seen by [name] for her congestive heart failure and by Dr. Vaughan Browner for COPD. She had repeat CT angiogram of the chest performed recently and she is here for evaluation and discussion of her scan results.  MEDICAL HISTORY: Past Medical History:  Diagnosis Date  . Arthritis   . Cancer ([place])    lung ca  . CHF (congestive heart failure) ([place]) 02/2016  . COPD (chronic obstructive pulmonary disease) ([place])   . Depression   .  Diabetes mellitus    [name]    dx  2008  . Hx of seasonal allergies   . Hypercholesteremia   . Hypertension    on no medications  . Lung cancer ([place])   . Pneumonia    2008  @  [name]  . Shortness of breath dyspnea    doe    ALLERGIES:  is allergic to amoxicillin.  MEDICATIONS:  Current Outpatient Prescriptions  Medication Sig Dispense Refill  . aspirin EC 81 MG tablet Take 81 mg by mouth every evening.    [name] atorvastatin (LIPITOR) 20 MG tablet Take 20 mg by mouth daily.    . celecoxib (CELEBREX) 100 MG capsule Take 1 capsule (100 mg total) by mouth daily as needed for mild pain.  1  . Fluticasone-Salmeterol (ADVAIR) 250-50 MCG/DOSE AEPB Inhale 1 puff into the lungs 2 (two) times daily.    . furosemide (LASIX) 40 MG tablet Take 1 tablet (40 mg total) by mouth 2 (two) times daily. Take one dose at 8 am and then take the 2nd dose at 2 pm every day 180 tablet 2  . gabapentin (NEURONTIN) 300 MG capsule Take 300 mg by mouth 3 (three) times daily.    [name] GLIPIZIDE XL 5 MG 24 hr tablet Take 5 mg by mouth every morning.  3  . ipratropium-albuterol (DUONEB) 0.5-2.5 (3) MG/3ML SOLN Take 3 mLs by nebulization every 4 (four) hours as needed (wheezing, Shortness of breath). 360 mL 0  . Liraglutide (VICTOZA) 18 MG/3ML SOPN Inject  1.8 mg into the skin daily.    . potassium chloride (K-DUR) 10 MEQ tablet Take 4 tablets (40 mEq total) by mouth daily. 60 tablet 6  . sertraline (ZOLOFT) 100 MG tablet Take 150 mg by mouth daily.     . sitaGLIPtin-metformin (JANUMET) 50-1000 MG per tablet Take 1 tablet by mouth 2 (two) times daily with a meal.     No current facility-administered medications for this visit.    Facility-Administered Medications Ordered in Other Visits  Medication Dose Route Frequency Provider Last Rate Last Dose  . diatrizoate meglumine-sodium (GASTROGRAFIN) 66-10 % solution 30 mL  30 mL Oral PRN Bjorn Loser, MD   30 mL at 11/30/15 0820    SURGICAL HISTORY:  Past Surgical  History:  Procedure Laterality Date  . [facility] in [place]     bilateral cataracts  . LEFT HEART CATHETERIZATION WITH CORONARY ANGIOGRAM N/A 07/12/2014   Procedure: LEFT HEART CATHETERIZATION WITH CORONARY ANGIOGRAM;  Surgeon: [name], MD;  Location: [facility] CATH LAB;  Service: Cardiovascular;  Laterality: N/A;  . MAXIMUM ACCESS (MAS)POSTERIOR LUMBAR INTERBODY FUSION (PLIF) 2 LEVEL N/A 02/22/2016   Procedure: Lumbar three-four - Lumbar four-five  MAXIMUM ACCESS SURGERY  POSTERIOR LUMBAR INTERBODY FUSION;  Surgeon: [name], MD;  Location: [facility];  Service: Neurosurgery;  Laterality: N/A;  . THORACOTOMY Right 2010   lower  . TONSILLECTOMY      REVIEW OF SYSTEMS:  A comprehensive review of systems was negative except for: Constitutional: positive for fatigue Respiratory: positive for cough and dyspnea on exertion   PHYSICAL EXAMINATION: General appearance: alert, cooperative and no distress Head: Normocephalic, without obvious abnormality, atraumatic Neck: no adenopathy Lymph nodes: Cervical, supraclavicular, and axillary nodes normal. Resp: clear to auscultation bilaterally Cardio: regular rate and rhythm, S1, S2 normal, no murmur, click, rub or gallop GI: soft, non-tender; bowel sounds normal; no masses,  no organomegaly Extremities: extremities normal, atraumatic, no cyanosis or edema  ECOG PERFORMANCE STATUS: 1 - Symptomatic but completely ambulatory  Blood pressure (!) 104/56, pulse 80, temperature 97.8 F (36.6 C), temperature source Oral, resp. rate 18, height _0  (1.549 m), weight 178 lb 9.6 oz (81 kg), SpO2 (!) 80 %.  LABORATORY DATA: Lab Results  Component Value Date   WBC WILL FOLLOW 07/21/2016   WBC 7.3 07/21/2016   HGB 12.1 06/23/2016   HCT WILL FOLLOW 07/21/2016   HCT 42.8 07/21/2016   MCV WILL FOLLOW 07/21/2016   MCV 86 07/21/2016   PLT WILL FOLLOW 07/21/2016   PLT 213 07/21/2016       Chemistry      Component Value Date/Time   NA 138 07/21/2016 1210   NA 136 11/30/2015 0758   K 4.2 07/21/2016 1210   K 4.2 11/30/2015 0758   CL 91 (L) 07/21/2016 1210   CL 104 08/30/2012 0908   CO2 28 07/21/2016 1210   CO2 27 11/30/2015 0758   BUN 34 (H) 07/21/2016 1210   BUN 19.0 11/30/2015 0758   CREATININE 1.09 (H) 07/21/2016 1210   CREATININE 0.77 03/28/2016 1504   CREATININE 1.2 (H) 11/30/2015 0758      Component Value Date/Time   CALCIUM 9.1 07/21/2016 1210   CALCIUM 10.3 11/30/2015 0758   ALKPHOS 156 (H) 06/23/2016 1650   ALKPHOS 112 11/30/2015 0758   AST 29 06/23/2016 1650   AST 13 11/30/2015 0758   ALT 31 06/23/2016  1650   ALT 16 11/30/2015 0758   BILITOT 0.6 06/23/2016 1650   BILITOT 0.39 11/30/2015 0758       RADIOGRAPHIC STUDIES: Dg Chest 2 View  Result Date: 06/23/2016 CLINICAL DATA:  SOB on exertion. Patient has hx of CHF and COPD. Pt has been off of lasix for 1 week. Pt notes 20 pound weight gain over one week with noted edema in abdomen and bilateral LE. EXAM: CHEST  2 VIEW COMPARISON:  05/15/2016 FINDINGS: Mild to moderate enlargement of the cardiopericardial silhouette. No mediastinal or hilar masses. No convincing adenopathy. There is bilateral interstitial thickening increased when compared the prior exam. No focal lung consolidation. No pleural effusion or pneumothorax. Stable changes from previous right lung surgery. Skeletal structures are demineralized but grossly intact. IMPRESSION: 1. Cardiomegaly with interstitial thickening suggesting interstitial edema. No convincing pneumonia. Electronically Signed   By: Lajean Manes M.D.   On: 06/23/2016 18:52   Ct Angio Chest Pe W Or Wo Contrast  Result Date: 07/21/2016 CLINICAL DATA:  Worsening dyspnea with exertion and right heart failure. History of COPD and previous right lung carcinoma with prior right lower superior segmentectomy. EXAM: CT ANGIOGRAPHY CHEST WITH CONTRAST TECHNIQUE: Multidetector CT  imaging of the chest was performed using the standard protocol during bolus administration of intravenous contrast. Multiplanar CT image reconstructions and MIPs were obtained to evaluate the vascular anatomy. CONTRAST:  80 mL Isovue 370 IV COMPARISON:  CT of the chest on 05/01/2016 FINDINGS: Cardiovascular: The pulmonary arteries are well opacified. There is no evidence of pulmonary embolism. The heart is mildly enlarged. Dilated right-sided chambers and reflux of contrast into the IVC and central hepatic veins is consistent with right heart failure. Stable small pericardial effusion is present. Stable evidence of coronary atherosclerosis with calcified plaque in a 3 vessel distribution. The thoracic aorta appears stable and shows normal caliber Mediastinum/Nodes: Stable scattered small mediastinal lymph nodes are identified. The largest in the AP window shows short axis dimensions of approximately 11 mm and appears stable. No hilar or axillary lymphadenopathy is identified. Lungs/Pleura: Lungs show stable scattered scarring bilaterally. Postsurgical changes again noted following right lower lobe superior segmentectomy. Minimal right pleural fluid identified. There is no evidence of left pleural fluid. No pulmonary edema or focal airspace consolidation identified. No evidence of pulmonary nodules. Upper Abdomen: Visualized upper abdomen is unremarkable. Musculoskeletal: Stable osteopenia and mild degenerative disc disease of the thoracic spine. Review of the MIP images confirms the above findings. IMPRESSION: 1. Evidence of right heart failure with dilated right-sided chambers and reflux of contrast into the IVC and hepatic veins. No evidence of overt pulmonary airspace edema. 2. Stable small pericardial effusion. 3. Stable small scattered mediastinal lymph nodes. 4. Small right pleural effusion. 5. Coronary atherosclerosis with calcified plaque in a 3 vessel distribution. Electronically Signed   By: Aletta Edouard M.D.   On: 07/21/2016 10:33   ASSESSMENT AND PLAN:  This is a very pleasant 71 years old white female with a stage IIB non-small cell lung cancer mixed with a small cell carcinoma. She status post right lower lobe superior segmentectomy with lymph node dissection followed by adjuvant systemic chemotherapy. The patient has been observation since April 2010. The recent CT scan of the chest showed no evidence for disease recurrence. I discussed the scan results with the patient today. I recommended for her to continue on observation with repeat CT scan of the chest in one year. For the congestive heart failure she continue her routine follow-up  visit with her cardiologist and for COPD she is followed by Dr. Vaughan Browner. We will provide the patient with temporary oxygen supply until she goes home. She was advised to call if she has any concerning symptoms in the interval. The patient voices understanding of current disease status and treatment options and is in agreement with the current care plan. All questions were answered. The patient knows to call the clinic with any problems, questions or concerns. We can certainly see the patient much sooner if necessary. I spent 10 minutes counseling the patient face to face. The total time spent in the appointment was 15 minutes. Disclaimer: This note was dictated with voice recognition software. Similar sounding words can inadvertently be transcribed and may not be corrected upon review.

## 2016-07-28 NOTE — Progress Notes (Signed)
CARDIOLOGY OFFICE NOTE  Date:  07/29/2016    Amy Shepard Date of Birth: 1945/11/16 Medical Record #[id number]  PCP:  [name], MD  Cardiologist:  Hazle Quant  Chief Complaint  Patient presents with  . Cardiomyopathy  . Shortness of Breath    Follow up visit - seen for [name]    History of Present Illness: Amy Shepard is a 71 y.o. female who presents today for a follow up visit. Seen for [name].   She has had prior lung cancer, has COPD, depression, DM, and known coronary calcification. Has had prior cardiac catheterization showing nonobstructive disease with a 40 to 60% LAD - risk factor modification was recommended. She had been released from Dr. [name] with oncology.   Seen back in July of 2017 after a 2 year absence - needing surgical clearance for lumbar fusion - EKG was abnormal - Myoview updated and this turned out ok - cleared for surgery.   Post op did ok but presented with shortness of breath and hypoxia on her follow up and got readmitted. This was felt to be multifactorial with COPD exacerbation and diastolic HF. Echo with normal EF. CT negative for PE but with new mediastinal adenopathy - recommend repeat study in 4 to 6 weeks.   I saw her back again in September. Her cardiac status seemed stable. Since then, she has had several admissions for recurrent COPD exacerbation/diastolic HF. Saw [name] in mid December for another post hospital visit and was felt to have some volume overload. Looks like her last CT of the chest from October showed an enlarging pericardial effusion and continued mediastinal adenopathy - PET CT was recommended - does not look like this was done.   I then saw her at the beginning of the month - she was very hypoxic - sats down to 77%. More swelling. Weight was up. Refused admission. Noted the pericardial effusion and got a limited echo - reviewed by Dr. [name] - worrisome for worsening right heart failure. I  diuresed her, arranged pulmonary and oncology follow up. CT planned for earlier this morning. She refused her oxygen at home due to the copay. At last visit - still did not have oxygen in place - sats down in the 60s. Had diuresed nicely with a few days of zaroxolyn. To see pulmonary and oncology after last visit with me.   Comes in today. Here alone. She has seen oncology - plan for repeat scans in one year - was told that everything looked "the same". Cancelled her pulmonary visit - said she got sick - had diarrhea - still has.  Sees them later this month. Comes in without her oxygen - sats down in the 70's again - says it is "at home". Says it is to heavy for her to carry.  Weight is going back up some - she does not know why. Says she is restricting her salt. The oxygen has helped her overall - when she wears.   Past Medical History:  Diagnosis Date  . Arthritis   . Cancer ([place])    lung ca  . CHF (congestive heart failure) ([place]) 02/2016  . COPD (chronic obstructive pulmonary disease) ([place])   . Depression   . Diabetes mellitus    [name]    dx  2008  . Hx of seasonal allergies   . Hypercholesteremia   . Hypertension    on no medications  . Lung cancer ([place])   .  Pneumonia    2008  @  [name]  . Shortness of breath dyspnea    doe    Past Surgical History:  Procedure Laterality Date  . [facility] in [place]     bilateral cataracts  . LEFT HEART CATHETERIZATION WITH CORONARY ANGIOGRAM N/A 07/12/2014   Procedure: LEFT HEART CATHETERIZATION WITH CORONARY ANGIOGRAM;  Surgeon: [name], MD;  Location: [facility] CATH LAB;  Service: Cardiovascular;  Laterality: N/A;  . MAXIMUM ACCESS (MAS)POSTERIOR LUMBAR INTERBODY FUSION (PLIF) 2 LEVEL N/A 02/22/2016   Procedure: Lumbar three-four - Lumbar four-five  MAXIMUM ACCESS SURGERY  POSTERIOR LUMBAR INTERBODY FUSION;  Surgeon: [name], MD;  Location: [facility]URO ORS;  Service:  Neurosurgery;  Laterality: N/A;  . THORACOTOMY Right 2010   lower  . TONSILLECTOMY       Medications: Current Outpatient Prescriptions  Medication Sig Dispense Refill  . aspirin EC 81 MG tablet Take 81 mg by mouth every evening.    [name] atorvastatin (LIPITOR) 20 MG tablet Take 20 mg by mouth daily.    . celecoxib (CELEBREX) 100 MG capsule Take 1 capsule (100 mg total) by mouth daily as needed for mild pain.  1  . Fluticasone-Salmeterol (ADVAIR) 250-50 MCG/DOSE AEPB Inhale 1 puff into the lungs 2 (two) times daily.    . furosemide (LASIX) 40 MG tablet Take 1 tablet (40 mg total) by mouth 2 (two) times daily. Take one dose at 8 am and then take the 2nd dose at 2 pm every day 180 tablet 2  . gabapentin (NEURONTIN) 300 MG capsule Take 300 mg by mouth 3 (three) times daily.    [name] GLIPIZIDE XL 5 MG 24 hr tablet Take 5 mg by mouth every morning.  3  . ipratropium-albuterol (DUONEB) 0.5-2.5 (3) MG/3ML SOLN Take 3 mLs by nebulization every 4 (four) hours as needed (wheezing, Shortness of breath). 360 mL 0  . Liraglutide (VICTOZA) 18 MG/3ML SOPN Inject 1.8 mg into the skin daily.    . potassium chloride (K-DUR) 10 MEQ tablet Take 4 tablets (40 mEq total) by mouth daily. Take extra tablet on Wednesdays and Saturdays (when taking Zaroxolyn) 60 tablet 6  . sertraline (ZOLOFT) 100 MG tablet Take 150 mg by mouth daily.     . sitaGLIPtin-metformin (JANUMET) 50-1000 MG per tablet Take 1 tablet by mouth 2 (two) times daily with a meal.    . [START ON 07/31/2016] metolazone (ZAROXOLYN) 2.5 MG tablet Take 1 tablet (2.5 mg total) by mouth 2 (two) times a week. Take on Wednesdays and Saturdays only 10 tablet 3   No current facility-administered medications for this visit.    Facility-Administered Medications Ordered in Other Visits  Medication Dose Route Frequency Provider Last Rate Last Dose  . diatrizoate meglumine-sodium (GASTROGRAFIN) 66-10 % solution 30 mL  30 mL Oral PRN Bjorn Loser, MD   30 mL at  11/30/15 0820    Allergies: Allergies  Allergen Reactions  . Amoxicillin Anaphylaxis, Hives and Rash    Has patient had a PCN reaction causing immediate rash, facial/tongue/throat swelling, SOB or lightheadedness with hypotension: YES Positive reaction causing SEVERE RASH INVOLVING MUCUS MEMBRANES/SKIN NECROSIS: YES Reaction that required HOSPITALIZATION: YES Reaction occurring within the last 10 years: NO    Social History: The patient  reports that she quit smoking about 10 years ago. She has a 30.00 pack-year smoking history. She has never used smokeless  tobacco. She reports that she does not drink alcohol or use drugs.   Family History: The patient's family history includes Cancer in her sister; Diabetes in her brother and father; Heart failure in her brother and father.   Review of Systems: Please see the history of present illness.   Otherwise, the review of systems is positive for none.   All other systems are reviewed and negative.   Physical Exam: VS:  BP 112/70   Pulse 76   Ht _0  (1.549 m)   Wt 184 lb 12.8 oz (83.8 kg)   SpO2 95% Comment: 2 liters  BMI 34.92 kg/m  .  BMI Body mass index is 34.92 kg/m.  Wt Readings from Last 3 Encounters:  07/29/16 184 lb 12.8 oz (83.8 kg)  07/23/16 178 lb 9.6 oz (81 kg)  07/21/16 177 lb 12.8 oz (80.6 kg)    General: Alert. Oxygen sats back in the 70's.   HEENT: Normal.  Neck: Supple, no JVD, carotid bruits, or masses noted.  Cardiac: Regular rate and rhythm. No murmurs, rubs, or gallops. No edema.  Respiratory:  Lungs are clear to auscultation bilaterally with normal work of breathing.  GI: Soft and nontender.  MS: No deformity or atrophy. Gait and ROM intact.  Skin: Warm and dry. Color is normal.  Neuro:  Strength and sensation are intact and no gross focal deficits noted.  Psych: Alert, appropriate and with normal affect.   LABORATORY DATA:  EKG:  EKG is not ordered today.  Lab Results  Component Value Date   WBC  WILL FOLLOW 07/21/2016   WBC 7.3 07/21/2016   HGB 12.1 06/23/2016   HCT WILL FOLLOW 07/21/2016   HCT 42.8 07/21/2016   PLT WILL FOLLOW 07/21/2016   PLT 213 07/21/2016   GLUCOSE 199 (H) 07/21/2016   ALT 31 06/23/2016   AST 29 06/23/2016   NA 138 07/21/2016   K 4.2 07/21/2016   CL 91 (L) 07/21/2016   CREATININE 1.09 (H) 07/21/2016   BUN 34 (H) 07/21/2016   CO2 28 07/21/2016   INR 1.00 02/01/2016   HGBA1C 11.6 (H) 05/15/2016    BNP (last 3 results)  Recent Labs  03/10/16 1832 03/28/16 1504 06/23/16 1702  BNP 167.6* 93.2 435.9*    ProBNP (last 3 results)  Recent Labs  07/16/16 1529 07/21/16 1210  PROBNP 2,851* 2,696*     Other Studies Reviewed Today:  Limited Echo Study Conclusions from 07/16/16  - Left ventricle: The cavity size was normal. Wall thickness was normal. Systolic function was normal. The estimated ejection fraction was in the range of 55% to 60%. Doppler parameters are consistent with abnormal left ventricular relaxation (grade 1 diastolic dysfunction). Doppler parameters are consistent with elevated mean left atrial filling pressure. - Ventricular septum: The contour showed diastolic flattening and systolic flattening. These changes are consistent with RV volume and pressure overload. - Left atrium: The atrium was mildly dilated. - Right ventricle: The cavity size was moderately dilated. Systolic function was moderately reduced. - Right atrium: The atrium was moderately dilated. - Atrial septum: No defect or patent foramen ovale was identified. - Tricuspid valve: There was mild-moderate regurgitation directed centrally. - Pulmonary arteries: Systolic pressure was moderately to severely increased. PA peak pressure: 75 mm Hg (S). - Pericardium, extracardiac: A small to moderate pericardial effusion was identified circumferential to the heart.  Impressions:  - Compared to August 2017, there is marked enlargement of the  right ventricle, deterioration in right ventricular function and  increased right atrial pressure, which all appear secondary to the development of moderate to severe pulmonary artery hypertension. Consider interval pulmonary embolism or other causes of worsening cor pulmonale.   Echo Study Conclusions from 02/2016  - Left ventricle: The cavity size was normal. Wall thickness was increased in a pattern of mild LVH. Systolic function was normal. The estimated ejection fraction was in the range of 60% to 65%. Wall motion was normal; there were no regional wall motion abnormalities. Features are consistent with a pseudonormal left ventricular filling pattern, with concomitant abnormal relaxation and increased filling pressure (grade 2 diastolic dysfunction).   CT ANGIO CHEST IMPRESSION 07/21/16: 1. Evidence of right heart failure with dilated right-sided chambers and reflux of contrast into the IVC and hepatic veins. No evidence of overt pulmonary airspace edema. 2. Stable small pericardial effusion. 3. Stable small scattered mediastinal lymph nodes. 4. Small right pleural effusion. 5. Coronary atherosclerosis with calcified plaque in a 3 vessel distribution.   Electronically Signed By: Aletta Edouard M.D. On: 07/21/2016 10:33   CT CHEST IMPRESSION 04/2016: Increasing small to moderate pericardial effusion.  Similar mediastinal lymphadenopathy which may be reactive though, again given patient's history of lung cancer, consider PET-CT.  Small RIGHT and trace LEFT residual pleural effusions with finding of chronic CHF.  Status post RIGHT thoracotomy and upper lobectomy.   Electronically Signed By: Elon Alas M.D. On: 05/01/2016 15:21   Myoview Study Highlights from 01/2016    Nuclear stress EF: 76%. No wall motion abnormalities  There was no ST segment deviation noted during stress.  Defect 1: There is a small  defect of mild severity present in the apex location. No ischemia identified  This is a low risk study.  Candee Furbish, MD     ANGIOGRAPHIC DATA FROM 06/2014: The left main coronary artery is widely patent.  The left anterior descending artery is heavily calcified in the proximal and mid vessel. There is eccentric 40% proximal narrowing. There is 50-60% mid narrowing after the origin of the large diagonal. No hemodynamically significant obstruction is noted in the LAD.  The left circumflex artery is widely patent. There is moderate calcification in the mid and distal vessel. There is 3 obtuse marginal branches. The second marginal is a dominant vessel that contains ectasia proximally but is otherwise normal.  The right coronary arterydominant and widely patent. Luminal irregularities are noted in the mid and distal vessel. [name]   LEFT VENTRICULOGRAM:Left ventricular angiogram was done in the 30 RAO projection and revealed normal LV cavity size and EF of 60%   IMPRESSIONS:1. Nonobstructive coronary artery disease 2. Three-vessel coronary calcification 3. Normal left ventricular systolic function   RECOMMENDATION:Risk factor modification.    Assessment/Plan:  1. Recurrent COPD/diastolic HF exacerbations - worsening right heart failure/hypoxia - refused prior admission earlier this month - could not afford the copay for oxygen with [facility] - has subsequently gotten thru [facility] (but apparently at the same cost) - Oxygen therapy is imperative going forward - this has been emphasized at her prior visits with me - she did not bring it with her today. I think she has very poor insight into her issues and I don't think I really have anything else to offer. She cancelled with pulmonary and does not see them until later this month.  Would leave on the Lasix 40 mg BID for now. Adding Zaroxolyn 2.5 mg to take just twice a week - on Wednesdays and Saturdays. Extra  potassium those days as well. Lab today  and again in 10 days. Will get her back to see [name] in about a month.   2. Known coronary disease - no active chest pain at this time.   3. Abnormal chest CT - with continued adenopathy and pericardial effusion - may need probable PET CT as recommended. Have deferred to oncology - no plans for further intervention at this time - to have repeat scans in one year.   4. HLD - list today notes she is on Lipitor.   5. History of lung cancer with adenopathy on recent CT with recommended PET study - noted oncology's note.   6. Pericardial effusion - small by recent echo/CT - would just follow.    Current medicines are reviewed with the patient today.  The patient does not have concerns regarding medicines other than what has been noted above.  The following changes have been made:  See above.  Labs/ tests ordered today include:    Orders Placed This Encounter  Procedures  . Basic metabolic panel  . Pro b natriuretic peptide (BNP)  . Basic metabolic panel     Disposition:   FU with [name] and her team as planned in one month.    Patient is agreeable to this plan and will call if any problems develop in the interim.   Signed: Burtis Junes, RN, ANP-C 07/29/2016 11:55 AM  [facility] [address] [place], [place]  [address] Phone: [phone] Fax: [phone]

## 2016-07-29 ENCOUNTER — Encounter: Payer: Self-pay | Admitting: Nurse Practitioner

## 2016-07-29 ENCOUNTER — Ambulatory Visit (INDEPENDENT_AMBULATORY_CARE_PROVIDER_SITE_OTHER): Payer: BLUE CROSS/BLUE SHIELD | Admitting: Nurse Practitioner

## 2016-07-29 VITALS — BP 112/70 | HR 76 | Ht 61.0 in | Wt 184.8 lb

## 2016-07-29 DIAGNOSIS — R0602 Shortness of breath: Secondary | ICD-10-CM

## 2016-07-29 DIAGNOSIS — I3139 Other pericardial effusion (noninflammatory): Secondary | ICD-10-CM

## 2016-07-29 DIAGNOSIS — Z85118 Personal history of other malignant neoplasm of bronchus and lung: Secondary | ICD-10-CM

## 2016-07-29 DIAGNOSIS — I10 Essential (primary) hypertension: Secondary | ICD-10-CM

## 2016-07-29 DIAGNOSIS — I5032 Chronic diastolic (congestive) heart failure: Secondary | ICD-10-CM | POA: Diagnosis not present

## 2016-07-29 DIAGNOSIS — I313 Pericardial effusion (noninflammatory): Secondary | ICD-10-CM | POA: Diagnosis not present

## 2016-07-29 MED ORDER — METOLAZONE 2.5 MG PO TABS: 2.5000 mg | ORAL_TABLET | ORAL | 3 refills | Status: DC

## 2016-07-29 MED ORDER — POTASSIUM CHLORIDE ER 10 MEQ PO TBCR: 40.0000 meq | EXTENDED_RELEASE_TABLET | Freq: Every day | ORAL | 6 refills | Status: DC

## 2016-07-29 NOTE — Patient Instructions (Addendum)
We will be checking the following labs today - BMET and pro BNP  BMET in 10 days  Medication Instructions:    Continue with your current medicines. BUT  I am putting you on Zaroxolyn 2.5 mg to take just twice a week - Wednesdays and Saturdays - take 1/2 hour prior to AM dose of Lasix.  Take extra potassium tablet on Wednesdays and Saturdays.     Testing/Procedures To Be Arranged:  N/A  Follow-Up:   See [name] and her team in one month.     Other Special Instructions:   Keep restricting your salt  See pulmonary as planned later this month.     If you need a refill on your cardiac medications before your next appointment, please call your pharmacy.   Call the [place] office at [phone] if you have any questions, problems or concerns.

## 2016-07-30 LAB — SPECIMEN STATUS

## 2016-07-31 LAB — BASIC METABOLIC PANEL
BUN/Creatinine Ratio: 24 (ref 12–28)
BUN: 32 mg/dL — ABNORMAL HIGH (ref 8–27)
CO2: 29 mmol/L (ref 18–29)
Calcium: 9.3 mg/dL (ref 8.7–10.3)
Chloride: 94 mmol/L — ABNORMAL LOW (ref 96–106)
Creatinine, Ser: 1.31 mg/dL — ABNORMAL HIGH (ref 0.57–1.00)
GFR calc Af Amer: 48 mL/min/{1.73_m2} — ABNORMAL LOW (ref >59)
GFR calc non Af Amer: 42 mL/min/{1.73_m2} — ABNORMAL LOW (ref >59)
Glucose: 240 mg/dL — ABNORMAL HIGH (ref 65–99)
Potassium: 4.7 mmol/L (ref 3.5–5.2)
Sodium: 136 mmol/L (ref 134–144)

## 2016-07-31 LAB — PRO B NATRIURETIC PEPTIDE: NT-Pro BNP: 3611 pg/mL — ABNORMAL HIGH (ref 0–301)

## 2016-08-01 ENCOUNTER — Telehealth: Payer: Self-pay | Admitting: Cardiology

## 2016-08-01 NOTE — Telephone Encounter (Signed)
Mrs. Molinda Bailiff is returning a call

## 2016-08-04 ENCOUNTER — Telehealth: Payer: Self-pay | Admitting: Internal Medicine

## 2016-08-04 ENCOUNTER — Other Ambulatory Visit: Payer: Self-pay | Admitting: Adult Health

## 2016-08-04 DIAGNOSIS — C3431 Malignant neoplasm of lower lobe, right bronchus or lung: Secondary | ICD-10-CM

## 2016-08-04 NOTE — Telephone Encounter (Signed)
Received schedule message from Mike Craze 07/31/16 to schedule patient for lab/ct/fu with [facility] in May.   Patient was seen on 07/23/16 by MM and order to returned in one year with lab/ct prior.   Spoke with MM and per MM disregard 1/19 schedule message for lab/ct/fu with [facility] and leave patient as scheduled with him. Schedule message for 1/19 removed from scheduling pool.

## 2016-08-08 ENCOUNTER — Other Ambulatory Visit: Payer: BLUE CROSS/BLUE SHIELD | Admitting: *Deleted

## 2016-08-08 DIAGNOSIS — I5032 Chronic diastolic (congestive) heart failure: Secondary | ICD-10-CM

## 2016-08-08 DIAGNOSIS — R0602 Shortness of breath: Secondary | ICD-10-CM

## 2016-08-09 LAB — BASIC METABOLIC PANEL
BUN/Creatinine Ratio: 28 (ref 12–28)
BUN: 38 mg/dL — ABNORMAL HIGH (ref 8–27)
CO2: 32 mmol/L — ABNORMAL HIGH (ref 18–29)
Calcium: 9.8 mg/dL (ref 8.7–10.3)
Chloride: 92 mmol/L — ABNORMAL LOW (ref 96–106)
Creatinine, Ser: 1.34 mg/dL — ABNORMAL HIGH (ref 0.57–1.00)
GFR calc Af Amer: 47 mL/min/{1.73_m2} — ABNORMAL LOW (ref >59)
GFR calc non Af Amer: 40 mL/min/{1.73_m2} — ABNORMAL LOW (ref >59)
Glucose: 193 mg/dL — ABNORMAL HIGH (ref 65–99)
Potassium: 3.8 mmol/L (ref 3.5–5.2)
Sodium: 144 mmol/L (ref 134–144)

## 2016-08-13 ENCOUNTER — Ambulatory Visit (INDEPENDENT_AMBULATORY_CARE_PROVIDER_SITE_OTHER): Payer: BLUE CROSS/BLUE SHIELD | Admitting: Pulmonary Disease

## 2016-08-13 ENCOUNTER — Other Ambulatory Visit (INDEPENDENT_AMBULATORY_CARE_PROVIDER_SITE_OTHER): Payer: BLUE CROSS/BLUE SHIELD

## 2016-08-13 ENCOUNTER — Encounter: Payer: Self-pay | Admitting: Pulmonary Disease

## 2016-08-13 VITALS — BP 140/80 | HR 77

## 2016-08-13 DIAGNOSIS — J438 Other emphysema: Secondary | ICD-10-CM | POA: Diagnosis not present

## 2016-08-13 LAB — CBC WITH DIFFERENTIAL/PLATELET
Basophils Absolute: 0 10*3/uL (ref 0.0–0.2)
Basos: 0 %
EOS (ABSOLUTE): 0.2 10*3/uL (ref 0.0–0.4)
Eos: 3 %
Hematocrit: 42.8 % (ref 34.0–46.6)
Hemoglobin: 13.2 g/dL (ref 11.1–15.9)
Immature Grans (Abs): 0 10*3/uL (ref 0.0–0.1)
Immature Granulocytes: 0 %
Lymphocytes Absolute: 0.8 10*3/uL (ref 0.7–3.1)
Lymphs: 11 %
MCH: 26.5 pg — ABNORMAL LOW (ref 26.6–33.0)
MCHC: 30.8 g/dL — ABNORMAL LOW (ref 31.5–35.7)
MCV: 86 fL (ref 79–97)
Monocytes Absolute: 0.7 10*3/uL (ref 0.1–0.9)
Monocytes: 9 %
Neutrophils Absolute: 5.6 10*3/uL (ref 1.4–7.0)
Neutrophils: 77 %
Platelets: 213 10*3/uL (ref 150–379)
RBC: 4.99 x10E6/uL (ref 3.77–5.28)
RDW: 15.8 % — ABNORMAL HIGH (ref 12.3–15.4)
WBC: 7.3 10*3/uL (ref 3.4–10.8)

## 2016-08-13 LAB — HEPATIC FUNCTION PANEL
ALK PHOS: 165 U/L — AB (ref 39–117)
ALT: 14 U/L (ref 0–35)
AST: 19 U/L (ref 0–37)
Albumin: 3.9 g/dL (ref 3.5–5.2)
BILIRUBIN TOTAL: 0.5 mg/dL (ref 0.2–1.2)
Bilirubin, Direct: 0.1 mg/dL (ref 0.0–0.3)
Total Protein: 7.3 g/dL (ref 6.0–8.3)

## 2016-08-13 LAB — SEDIMENTATION RATE: SED RATE: 37 mm/h — AB (ref 0–30)

## 2016-08-13 LAB — SPECIMEN STATUS REPORT

## 2016-08-13 NOTE — Progress Notes (Signed)
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 Her cancer has been treated with RLL superior subsectomy resection and adjuvant chemotherapy in 2010 with no evidence of recurrence. She was hospitalized in April 2017 for surgery and had to CHF exacerbations since then. She is recently started on oxygen for persistent hypoxia and dyspnea. She's had an echocardiogram this month which showed new RV failure and a follow-up CTA of the chest negative for pulmonary embolus or any other acute pulmonary issue. She has a history of COPD and PFTs from 2012 with minimal obstruction which is maintained on Advair  In the office today she complains of dyspnea and excision. Denies any cough, sputum production, fevers, chills, wheezing.  Outpatient Encounter Prescriptions as of 08/13/2016  Medication Sig  . aspirin EC 81 MG tablet Take 81 mg by mouth every evening.  [name] atorvastatin (LIPITOR) 20 MG tablet Take 20 mg by mouth daily.  . celecoxib (CELEBREX) 100 MG capsule Take 1 capsule (100 mg total) by mouth daily as needed for mild pain.  [name] Fluticasone-Salmeterol (ADVAIR) 250-50 MCG/DOSE AEPB Inhale 1 puff into the lungs 2 (two) times daily.  . furosemide (LASIX) 40 MG tablet Take 1 tablet (40 mg total) by mouth 2 (two) times daily. Take one dose at 8 am and then take the 2nd dose at 2 pm every day  . gabapentin (NEURONTIN) 300 MG capsule Take 300 mg by mouth 3 (three) times daily.  [name] GLIPIZIDE XL 5 MG 24 hr tablet Take 5 mg by mouth every morning.  [name] ipratropium-albuterol (DUONEB) 0.5-2.5 (3) MG/3ML SOLN Take 3 mLs by nebulization every 4 (four) hours as needed (wheezing,  Shortness of breath).  . Liraglutide (VICTOZA) 18 MG/3ML SOPN Inject 1.8 mg into the skin daily.  . metolazone (ZAROXOLYN) 2.5 MG tablet Take 1 tablet (2.5 mg total) by mouth 2 (two) times a week. Take on Wednesdays and Saturdays only  . potassium chloride (K-DUR) 10 MEQ tablet Take 4 tablets (40 mEq total) by mouth daily. Take extra tablet on Wednesdays and Saturdays (when taking Zaroxolyn)  . sertraline (ZOLOFT) 100 MG tablet Take 150 mg by mouth daily.   . sitaGLIPtin-metformin (JANUMET) 50-1000 MG per tablet Take 1 tablet by mouth 2 (two) times daily with a meal.   Facility-Administered Encounter Medications as of 08/13/2016  Medication  . diatrizoate meglumine-sodium (GASTROGRAFIN) 66-10 % solution 30 mL    Allergies as of 08/13/2016 - Review Complete 08/13/2016  Allergen Reaction Noted  . Amoxicillin Anaphylaxis, Hives, and Rash 09/03/2011    Past Medical History:  Diagnosis Date  . Arthritis   . Cancer ([place])    lung ca  . CHF (congestive heart failure) ([place]) 02/2016  . COPD (chronic obstructive pulmonary disease) ([place])   . Depression   . Diabetes mellitus    [name]    dx  2008  . Hx of seasonal allergies   . Hypercholesteremia   . Hypertension    on no medications  . Lung cancer ([place])   . Pneumonia    2008  @  [name]  . Shortness of breath dyspnea    doe  Past Surgical History:  Procedure Laterality Date  . [place] or [facility] in [place]     bilateral cataracts  . LEFT HEART CATHETERIZATION WITH CORONARY ANGIOGRAM N/A 07/12/2014   Procedure: LEFT HEART CATHETERIZATION WITH CORONARY ANGIOGRAM;  Surgeon: [name], MD;  Location: [facility] CATH LAB;  Service: Cardiovascular;  Laterality: N/A;  . MAXIMUM ACCESS (MAS)POSTERIOR LUMBAR INTERBODY FUSION (PLIF) 2 LEVEL N/A 02/22/2016   Procedure: Lumbar three-four - Lumbar four-five  MAXIMUM ACCESS SURGERY  POSTERIOR LUMBAR INTERBODY FUSION;  Surgeon: [name], MD;  Location: [facility] NEURO ORS;  Service: Neurosurgery;  Laterality: N/A;  . THORACOTOMY Right 2010   lower  . TONSILLECTOMY      Family History  Problem Relation Age of Onset  . Diabetes Father   . Heart failure Father   . Cancer Sister   . Diabetes Brother   . Heart failure Brother     Social History   Social History  . Marital status: Divorced    Spouse name: N/A  . Number of children: N/A  . Years of education: N/A   Occupational History  . Not on file.   Social History Main Topics  . Smoking status: Former Smoker    Packs/day: 1.00    Years: 30.00    Quit date: 07/14/2006  . Smokeless tobacco: Never Used  . Alcohol use No  . Drug use: No  . Sexual activity: Not on file   Other Topics Concern  . Not on file   Social History Narrative  . No narrative on file    Review of systems: Review of Systems  Constitutional: Negative for fever and chills.  HENT: Negative.   Eyes: Negative for blurred vision.  Respiratory: as per HPI  Cardiovascular: Negative for chest pain and palpitations.  Gastrointestinal: Negative for vomiting, diarrhea, blood per rectum. Genitourinary: Negative for dysuria, urgency, frequency and hematuria.  Musculoskeletal: Negative for myalgias, back pain and joint pain.  Skin: Negative for itching and rash.  Neurological: Negative for dizziness, tremors, focal weakness, seizures and loss of consciousness.  Endo/Heme/Allergies: Negative for environmental allergies.  Psychiatric/Behavioral: Negative for depression, suicidal ideas and hallucinations.  All other systems reviewed and are negative.  Physical Exam: Blood pressure 140/80, pulse 77, SpO2 90 %. Gen:      No acute distress, rash over the cheeks HEENT:  EOMI, sclera anicteric Neck:     No masses; no thyromegaly Lungs:    Scattered crackles; normal respiratory effort CV:         Regular rate and rhythm; no murmurs Abd:      + bowel sounds; soft, non-tender; no palpable masses,  no distension Ext:    No edema; adequate peripheral perfusion Skin:      Warm and dry; no rash Neuro: alert and oriented x 3 Psych: normal mood and affect  Data Reviewed: CT scan 07/21/16- No PE, evidence of right heart failure, stable small pericardial effusion , stable mediastinal lymph nodes, stable small right pleural effusion, coronary atherosclerosis. Postsurgical changes. Images reviewed.  PFTs 01/01/11 FVC 2.02 [77) FEV1 1.63 [86%) F/F 81 TLC 84% DLCO 49% Minimal obstructive airway disease, small airway disease. No restriction Moderate to severe reduction in diffusion capacity  Echo 07/16/16 - Compared to August 2017, there is marked enlargement of the right   ventricle, deterioration in right ventricular function and   increased right atrial pressure, which all appear secondary  to   the development of moderate to severe pulmonary artery   hypertension. Consider interval pulmonary embolism or other   causes of worsening cor pulmonale.  Assessment:  Consult for eval of dyspnea, H/O COPD She has history of COPD and is a past smoker. Her PFTs from 2012 showing a very minimal obstructive airway, small airway disease she is on Advair and I'll continue the same. I'll schedule her for repeat pulmonary function tests to get more recent assessment.  Pulmonary HTN with cor pulmonale She has new right ventricular failure of unclear etiology. She appears to be persistently hypoxic even on 2 L oxygen. I have instructed her to increase her oxygen to maintain sats greater than 90%. There is no evidence of acute PE or other interstitial abnormalities on the recent CTA of the chest. I'll start the workup for [facility] with lab work for HIV, liver tests, serologies for autoimmune process. Her diuretics are being managed by cardiology. She'll return to clinic in 1 month. If the above workup is negative then she will need a right heart cath for further evaluation.  Plan/Recommendations: - PFTs, sleep  study - Labs tests for HIV, ANA, ANCA, Scl 70, CCP, RF and sed rate - Continue advair - Continue supplemental O2  Return in 1 month.  Marshell Garfinkel MD [facility] Pager [phone] 08/13/2016, 9:36 AM  CC: Maurice Small, MD

## 2016-08-13 NOTE — Patient Instructions (Addendum)
We will schedule for pulmonary function tests and a sleep study Check labs including HIV, liver function tests, ANA with reflex panel, ANCA, SCL 70,CCP, RF, sed rate Continue using your inhalers as prescribed Please increase her oxygen to maintain saturation greater than 90%  Return to clinic in 1 month.

## 2016-08-14 LAB — HIV ANTIBODY (ROUTINE TESTING W REFLEX): HIV 1&2 Ab, 4th Generation: NONREACTIVE

## 2016-08-14 LAB — ANA W/REFLEX: Anti Nuclear Antibody(ANA): NEGATIVE

## 2016-08-14 LAB — ANTI-SCLERODERMA ANTIBODY: SCLERODERMA (SCL-70) (ENA) ANTIBODY, IGG: NEGATIVE

## 2016-08-14 LAB — RHEUMATOID FACTOR

## 2016-08-15 LAB — CBC WITH DIFFERENTIAL/PLATELET
Basophils Absolute: 0 10*3/uL (ref 0.0–0.2)
Basos: 0 %
EOS (ABSOLUTE): 0.2 10*3/uL (ref 0.0–0.4)
Eos: 2 %
Hematocrit: 42.6 % (ref 34.0–46.6)
Hemoglobin: 12.9 g/dL (ref 11.1–15.9)
Immature Grans (Abs): 0 10*3/uL (ref 0.0–0.1)
Immature Granulocytes: 0 %
Lymphocytes Absolute: 1 10*3/uL (ref 0.7–3.1)
Lymphs: 12 %
MCH: 25.5 pg — ABNORMAL LOW (ref 26.6–33.0)
MCHC: 30.3 g/dL — ABNORMAL LOW (ref 31.5–35.7)
MCV: 84 fL (ref 79–97)
Monocytes Absolute: 0.6 10*3/uL (ref 0.1–0.9)
Monocytes: 7 %
Neutrophils Absolute: 6.9 10*3/uL (ref 1.4–7.0)
Neutrophils: 79 %
Platelets: 238 10*3/uL (ref 150–379)
RBC: 5.06 x10E6/uL (ref 3.77–5.28)
RDW: 15.6 % — ABNORMAL HIGH (ref 12.3–15.4)
WBC: 8.7 10*3/uL (ref 3.4–10.8)

## 2016-08-15 LAB — SPECIMEN STATUS REPORT

## 2016-08-15 LAB — ANCA SCREEN W REFLEX TITER: ANCA SCREEN: POSITIVE — AB

## 2016-08-15 LAB — CYCLIC CITRUL PEPTIDE ANTIBODY, IGG: Cyclic Citrullin Peptide Ab: <16 Units

## 2016-08-15 LAB — C-ANCA TITER: C-ANCA: 1:20 {titer} — ABNORMAL HIGH

## 2016-09-05 ENCOUNTER — Telehealth: Payer: Self-pay | Admitting: *Deleted

## 2016-09-05 ENCOUNTER — Ambulatory Visit (INDEPENDENT_AMBULATORY_CARE_PROVIDER_SITE_OTHER): Payer: Self-pay | Admitting: Cardiology

## 2016-09-05 ENCOUNTER — Encounter: Payer: Self-pay | Admitting: Cardiology

## 2016-09-05 ENCOUNTER — Encounter (INDEPENDENT_AMBULATORY_CARE_PROVIDER_SITE_OTHER): Payer: Self-pay

## 2016-09-05 ENCOUNTER — Telehealth: Payer: Self-pay | Admitting: Internal Medicine

## 2016-09-05 VITALS — BP 126/72 | HR 75 | Ht 61.0 in | Wt 181.0 lb

## 2016-09-05 DIAGNOSIS — I5023 Acute on chronic systolic (congestive) heart failure: Secondary | ICD-10-CM

## 2016-09-05 DIAGNOSIS — I313 Pericardial effusion (noninflammatory): Secondary | ICD-10-CM

## 2016-09-05 DIAGNOSIS — I2781 Cor pulmonale (chronic): Secondary | ICD-10-CM

## 2016-09-05 DIAGNOSIS — I3139 Other pericardial effusion (noninflammatory): Secondary | ICD-10-CM

## 2016-09-05 DIAGNOSIS — I270 Primary pulmonary hypertension: Secondary | ICD-10-CM

## 2016-09-05 DIAGNOSIS — R0902 Hypoxemia: Secondary | ICD-10-CM

## 2016-09-05 DIAGNOSIS — I5032 Chronic diastolic (congestive) heart failure: Secondary | ICD-10-CM

## 2016-09-05 LAB — BASIC METABOLIC PANEL
BUN/Creatinine Ratio: 19 (ref 12–28)
BUN: 20 mg/dL (ref 8–27)
CO2: 29 mmol/L (ref 18–29)
Calcium: 9.6 mg/dL (ref 8.7–10.3)
Chloride: 87 mmol/L — ABNORMAL LOW (ref 96–106)
Creatinine, Ser: 1.04 mg/dL — ABNORMAL HIGH (ref 0.57–1.00)
GFR calc Af Amer: 63 mL/min/{1.73_m2} (ref >59)
GFR calc non Af Amer: 55 mL/min/{1.73_m2} — ABNORMAL LOW (ref >59)
Glucose: 511 mg/dL (ref 65–99)
Potassium: 4.3 mmol/L (ref 3.5–5.2)
Sodium: 134 mmol/L (ref 134–144)

## 2016-09-05 LAB — PRO B NATRIURETIC PEPTIDE: NT-Pro BNP: 2929 pg/mL — ABNORMAL HIGH (ref 0–301)

## 2016-09-05 MED ORDER — SPIRONOLACTONE 25 MG PO TABS: 25.0000 mg | ORAL_TABLET | Freq: Every day | ORAL | 3 refills | Status: DC

## 2016-09-05 NOTE — Telephone Encounter (Signed)
Received call from [place] on a critical lab of glucose 511 from labs this morning.  Called patient and advised of the result and asked if she haas had any results in the past.  She stated that she has run out or her glucose medicine, not on insulin. I advised her to go to an urgent care so that she can be given insulin to reduced the glucose.  I advised her to get her meds refilled asap.  She expressed understanding and said she will go to urgent care.

## 2016-09-05 NOTE — Telephone Encounter (Signed)
New Message:   [facility] have a Critical Lab Result-

## 2016-09-05 NOTE — Progress Notes (Signed)
CARDIOLOGY OFFICE NOTE  Date:  09/05/2016    [name] Date of Birth: [birth year] Medical Record #[id number]  PCP:  [name], MD  Cardiologist:  Hazle Quant  Chief Complaint  Patient presents with  . Labs Only    History of Present Illness: Amy Shepard is a 71 y.o. female who presents today for a follow up visit. Seen for [name].   She has had prior lung cancer, has COPD, depression, DM, and known coronary calcification. Has had prior cardiac catheterization showing nonobstructive disease with a 40 to 60% LAD - risk factor modification was recommended. She had been released from Dr. [name] with oncology.   Seen back in July of 2017 after a 2 year absence - needing surgical clearance for lumbar fusion - EKG was abnormal - Myoview updated and this turned out ok - cleared for surgery.   Post op did ok but presented with shortness of breath and hypoxia on her follow up and got readmitted. This was felt to be multifactorial with COPD exacerbation and diastolic HF. Echo with normal EF. CT negative for PE but with new mediastinal adenopathy - recommend repeat study in 4 to 6 weeks.   I saw her back again in September. Her cardiac status seemed stable. Since then, she has had several admissions for recurrent COPD exacerbation/diastolic HF. Saw [name] in mid December for another post hospital visit and was felt to have some volume overload. Looks like her last CT of the chest from October showed an enlarging pericardial effusion and continued mediastinal adenopathy - PET CT was recommended - does not look like this was done.   I then saw her at the beginning of the month - she was very hypoxic - sats down to 77%. More swelling. Weight was up. Refused admission. Noted the pericardial effusion and got a limited echo - reviewed by Dr. [name] - worrisome for worsening right heart failure. I diuresed her, arranged pulmonary and oncology follow up. CT planned for  earlier this morning. She refused her oxygen at home due to the copay. At last visit - still did not have oxygen in place - sats down in the 60s. Had diuresed nicely with a few days of zaroxolyn. To see pulmonary and oncology after last visit with me.   Comes in today. Here alone. She has seen oncology - plan for repeat scans in one year - was told that everything looked "the same". Cancelled her pulmonary visit - said she got sick - had diarrhea - still has.  Sees them later this month. Comes in without her oxygen - sats down in the 70's again - says it is "at home". Says it is to heavy for her to carry.  Weight is going back up some - she does not know why. Says she is restricting her salt. The oxygen has helped her overall - when she wears.   09/05/2016 -this is 1 month follow-up, the patient states that she has been feeling minimally better, her lower extremity edema has improved however breathing hasn't improved significantly. She wears oxygen during the day but not at night. Her resting SPO2  Today is 90%. She was seen by the pulmonary specialist was scheduled pulmonary function test for March. Denies any palpitations dizziness or syncope no orthopnea or paroxysmal nocturnal dyspnea.   Past Medical History:  Diagnosis Date  . Arthritis   . Cancer ([place])    lung ca  . CHF (congestive heart failure) (  [place]) 02/2016  . COPD (chronic obstructive pulmonary disease) ([place])   . Depression   . Diabetes mellitus    [name]    dx  2008  . Hx of seasonal allergies   . Hypercholesteremia   . Hypertension    on no medications  . Lung cancer ([name])   . Pneumonia    2008  @  [name]  . Shortness of breath dyspnea    doe    Past Surgical History:  Procedure Laterality Date  . [place] or [facility] in [place]     bilateral cataracts  . LEFT HEART CATHETERIZATION WITH CORONARY ANGIOGRAM N/A 07/12/2014   Procedure: LEFT HEART CATHETERIZATION  WITH CORONARY ANGIOGRAM;  Surgeon: [name], MD;  Location: [facility] CATH LAB;  Service: Cardiovascular;  Laterality: N/A;  . MAXIMUM ACCESS (MAS)POSTERIOR LUMBAR INTERBODY FUSION (PLIF) 2 LEVEL N/A 02/22/2016   Procedure: Lumbar three-four - Lumbar four-five  MAXIMUM ACCESS SURGERY  POSTERIOR LUMBAR INTERBODY FUSION;  Surgeon: [name], MD;  Location: [facility] NEURO ORS;  Service: Neurosurgery;  Laterality: N/A;  . THORACOTOMY Right 2010   lower  . TONSILLECTOMY       Medications: Current Outpatient Prescriptions  Medication Sig Dispense Refill  . aspirin EC 81 MG tablet Take 81 mg by mouth every evening.    [name] atorvastatin (LIPITOR) 20 MG tablet Take 20 mg by mouth daily.    . celecoxib (CELEBREX) 100 MG capsule Take 1 capsule (100 mg total) by mouth daily as needed for mild pain.  1  . Fluticasone-Salmeterol (ADVAIR) 250-50 MCG/DOSE AEPB Inhale 1 puff into the lungs 2 (two) times daily.    . furosemide (LASIX) 40 MG tablet Take 1 tablet (40 mg total) by mouth 2 (two) times daily. Take one dose at 8 am and then take the 2nd dose at 2 pm every day 180 tablet 2  . gabapentin (NEURONTIN) 300 MG capsule Take 300 mg by mouth 3 (three) times daily.    [name] GLIPIZIDE XL 5 MG 24 hr tablet Take 5 mg by mouth every morning.  3  . ipratropium-albuterol (DUONEB) 0.5-2.5 (3) MG/3ML SOLN Take 3 mLs by nebulization every 4 (four) hours as needed (wheezing, Shortness of breath). 360 mL 0  . Liraglutide (VICTOZA) 18 MG/3ML SOPN Inject 1.8 mg into the skin daily.    . potassium chloride (K-DUR) 10 MEQ tablet Take 4 tablets (40 mEq total) by mouth daily. Take extra tablet on Wednesdays and Saturdays (when taking Zaroxolyn) 60 tablet 6  . sertraline (ZOLOFT) 100 MG tablet Take 150 mg by mouth daily.     . sitaGLIPtin-metformin (JANUMET) 50-1000 MG per tablet Take 1 tablet by mouth 2 (two) times daily with a meal.    . spironolactone (ALDACTONE) 25 MG tablet Take 1 tablet (25 mg total) by mouth daily. 90 tablet 3     No current facility-administered medications for this visit.    Facility-Administered Medications Ordered in Other Visits  Medication Dose Route Frequency Provider Last Rate Last Dose  . diatrizoate meglumine-sodium (GASTROGRAFIN) 66-10 % solution 30 mL  30 mL Oral PRN Bjorn Loser, MD   30 mL at 11/30/15 0820    Allergies: Allergies  Allergen Reactions  . Amoxicillin Anaphylaxis, Hives and Rash    Has patient had a PCN reaction causing immediate rash, facial/tongue/throat swelling, SOB or lightheadedness with hypotension: YES Positive reaction causing SEVERE RASH INVOLVING MUCUS  MEMBRANES/SKIN NECROSIS: YES Reaction that required HOSPITALIZATION: YES Reaction occurring within the last 10 years: NO    Social History: The patient  reports that she quit smoking about 10 years ago. She has a 30.00 pack-year smoking history. She has never used smokeless tobacco. She reports that she does not drink alcohol or use drugs.   Family History: The patient's family history includes Cancer in her sister; Diabetes in her brother and father; Heart failure in her brother and father.   Review of Systems: Please see the history of present illness.   Otherwise, the review of systems is positive for none.   All other systems are reviewed and negative.   Physical Exam: VS:  BP 126/72   Pulse 75   Ht _0  (1.549 m)   Wt 181 lb (82.1 kg)   BMI 34.20 kg/m  .  BMI Body mass index is 34.2 kg/m.  Wt Readings from Last 3 Encounters:  09/05/16 181 lb (82.1 kg)  07/29/16 184 lb 12.8 oz (83.8 kg)  07/23/16 178 lb 9.6 oz (81 kg)    General: Alert. Oxygen sats back in the 70's.   HEENT: Normal.  Neck: Supple, no JVD, carotid bruits, or masses noted.  Cardiac: Regular rate and rhythm. No murmurs, rubs, or gallops. No edema.  Respiratory:  Lungs are clear to auscultation bilaterally with normal work of breathing.  GI: Soft and nontender.  MS: No deformity or atrophy. Gait and ROM intact.  Skin:  Warm and dry. Color is normal.  Neuro:  Strength and sensation are intact and no gross focal deficits noted.  Psych: Alert, appropriate and with normal affect.   LABORATORY DATA:  EKG:  EKG is not ordered today.  Lab Results  Component Value Date   WBC WILL FOLLOW 07/29/2016   WBC 8.7 07/29/2016   HGB 12.1 06/23/2016   HCT WILL FOLLOW 07/29/2016   HCT 42.6 07/29/2016   PLT WILL FOLLOW 07/29/2016   PLT 238 07/29/2016   GLUCOSE 193 (H) 08/08/2016   ALT 14 08/13/2016   AST 19 08/13/2016   NA 144 08/08/2016   K 3.8 08/08/2016   CL 92 (L) 08/08/2016   CREATININE 1.34 (H) 08/08/2016   BUN 38 (H) 08/08/2016   CO2 32 (H) 08/08/2016   INR 1.00 02/01/2016   HGBA1C 11.6 (H) 05/15/2016    BNP (last 3 results)  Recent Labs  03/10/16 1832 03/28/16 1504 06/23/16 1702  BNP 167.6* 93.2 435.9*    ProBNP (last 3 results)  Recent Labs  07/16/16 1529 07/21/16 1210 07/29/16 1159  PROBNP 2,851* 2,696* 3,611*     Other Studies Reviewed Today:  Limited Echo Study Conclusions from 07/16/16  - Left ventricle: The cavity size was normal. Wall thickness was normal. Systolic function was normal. The estimated ejection fraction was in the range of 55% to 60%. Doppler parameters are consistent with abnormal left ventricular relaxation (grade 1 diastolic dysfunction). Doppler parameters are consistent with elevated mean left atrial filling pressure. - Ventricular septum: The contour showed diastolic flattening and systolic flattening. These changes are consistent with RV volume and pressure overload. - Left atrium: The atrium was mildly dilated. - Right ventricle: The cavity size was moderately dilated. Systolic function was moderately reduced. - Right atrium: The atrium was moderately dilated. - Atrial septum: No defect or patent foramen ovale was identified. - Tricuspid valve: There was mild-moderate regurgitation directed centrally. - Pulmonary arteries:  Systolic pressure was moderately to severely increased. PA peak pressure: 75 mm  Hg (S). - Pericardium, extracardiac: A small to moderate pericardial effusion was identified circumferential to the heart.  Impressions:  - Compared to August 2017, there is marked enlargement of the right ventricle, deterioration in right ventricular function and increased right atrial pressure, which all appear secondary to the development of moderate to severe pulmonary artery hypertension. Consider interval pulmonary embolism or other causes of worsening cor pulmonale.   Echo Study Conclusions from 02/2016  - Left ventricle: The cavity size was normal. Wall thickness was increased in a pattern of mild LVH. Systolic function was normal. The estimated ejection fraction was in the range of 60% to 65%. Wall motion was normal; there were no regional wall motion abnormalities. Features are consistent with a pseudonormal left ventricular filling pattern, with concomitant abnormal relaxation and increased filling pressure (grade 2 diastolic dysfunction).   CT ANGIO CHEST IMPRESSION 07/21/16: 1. Evidence of right heart failure with dilated right-sided chambers and reflux of contrast into the IVC and hepatic veins. No evidence of overt pulmonary airspace edema. 2. Stable small pericardial effusion. 3. Stable small scattered mediastinal lymph nodes. 4. Small right pleural effusion. 5. Coronary atherosclerosis with calcified plaque in a 3 vessel distribution.   Electronically Signed By: Aletta Edouard M.D. On: 07/21/2016 10:33   CT CHEST IMPRESSION 04/2016: Increasing small to moderate pericardial effusion.  Similar mediastinal lymphadenopathy which may be reactive though, again given patient's history of lung cancer, consider PET-CT.  Small RIGHT and trace LEFT residual pleural effusions with finding of chronic CHF.  Status post RIGHT thoracotomy and  upper lobectomy.   Electronically Signed By: Elon Alas M.D. On: 05/01/2016 15:21   Myoview Study Highlights from 01/2016    Nuclear stress EF: 76%. No wall motion abnormalities  There was no ST segment deviation noted during stress.  Defect 1: There is a small defect of mild severity present in the apex location. No ischemia identified  This is a low risk study.  Candee Furbish, MD     ANGIOGRAPHIC DATA FROM 06/2014: The left main coronary artery is widely patent.  The left anterior descending artery is heavily calcified in the proximal and mid vessel. There is eccentric 40% proximal narrowing. There is 50-60% mid narrowing after the origin of the large diagonal. No hemodynamically significant obstruction is noted in the LAD.  The left circumflex artery is widely patent. There is moderate calcification in the mid and distal vessel. There is 3 obtuse marginal branches. The second marginal is a dominant vessel that contains ectasia proximally but is otherwise normal.  The right coronary arterydominant and widely patent. Luminal irregularities are noted in the mid and distal vessel. [name]   LEFT VENTRICULOGRAM:Left ventricular angiogram was done in the 30 RAO projection and revealed normal LV cavity size and EF of 60%   IMPRESSIONS:1. Nonobstructive coronary artery disease 2. Three-vessel coronary calcification 3. Normal left ventricular systolic function   RECOMMENDATION:Risk factor modification.    Assessment/Plan:  1. Recurrent COPD/cor pulmonale/severe pulmonary hypertension - improved with diuretics/home O2, advair. Scheduled for PFTs. - d/c metolazone as it makes her nauseous and dizzy, start spironolactone 25 mg po daily.  - follow up in 6 weeks, BNP and BMP today.  2. Known coronary disease - no active chest pain at this time.   3. Abnormal chest CT - with continued adenopathy and pericardial effusion - may need  probable PET CT as recommended. Have deferred to oncology - no plans for further intervention at this time - to have repeat scans in  one year.   4. HLD - list today notes she is on Lipitor.   5. History of lung cancer with adenopathy on recent CT with recommended PET study - noted oncology's note.   6. Pericardial effusion - small by recent echo/CT - would just follow.    Current medicines are reviewed with the patient today.  The patient does not have concerns regarding medicines other than what has been noted above.  The following changes have been made:  See above.  Labs/ tests ordered today include:    Orders Placed This Encounter  Procedures  . Basic Metabolic Panel (BMET)  . Pro b natriuretic peptide (BNP)     Disposition:   Follow up in 6 weeks.  Patient is agreeable to this plan and will call if any problems develop in the interim.   Signed: Ena Dawley, MD 09/05/2016 12:22 PM  [facility] [address] [place], [place]  [address] Phone: [phone] Fax: [phone]

## 2016-09-05 NOTE — Patient Instructions (Addendum)
Medication Instructions:  STOP Metolazone (Zaroxoloyn) START Spironolactone (Aldactone) 25 mg once daily   Labwork: TODAY - BNP, BMET   Testing/Procedures: None Ordered   Follow-Up: Your physician recommends that you schedule a follow-up appointment in: 6 weeks with [name] or Truitt Merle, NP   If you need a refill on your cardiac medications before your next appointment, please call your pharmacy.   Thank you for choosing [facility]! Christen Bame, RN [phone]

## 2016-09-08 NOTE — Telephone Encounter (Signed)
This was sent to Dr [name] as an Juluis Rainier.

## 2016-09-11 ENCOUNTER — Ambulatory Visit (INDEPENDENT_AMBULATORY_CARE_PROVIDER_SITE_OTHER): Payer: Self-pay | Admitting: Pulmonary Disease

## 2016-09-11 ENCOUNTER — Other Ambulatory Visit (INDEPENDENT_AMBULATORY_CARE_PROVIDER_SITE_OTHER): Payer: Self-pay

## 2016-09-11 ENCOUNTER — Ambulatory Visit (HOSPITAL_COMMUNITY)
Admission: RE | Admit: 2016-09-11 | Discharge: 2016-09-11 | Disposition: A | Discharge status: Home / Self Care | Payer: BLUE CROSS/BLUE SHIELD | Source: Ambulatory Visit | Attending: Pulmonary Disease | Admitting: Pulmonary Disease

## 2016-09-11 ENCOUNTER — Encounter: Payer: Self-pay | Admitting: Pulmonary Disease

## 2016-09-11 VITALS — BP 132/74 | HR 91 | Ht 61.0 in | Wt 171.0 lb

## 2016-09-11 DIAGNOSIS — J438 Other emphysema: Secondary | ICD-10-CM

## 2016-09-11 DIAGNOSIS — R0602 Shortness of breath: Secondary | ICD-10-CM

## 2016-09-11 LAB — PULMONARY FUNCTION TEST
DL/VA % pred: 64 %
DL/VA: 2.85 ml/min/mmHg/L
DLCO UNC % PRED: 31 %
DLCO unc: 6.4 ml/min/mmHg
FEF 25-75 PRE: 1.55 L/s
FEF 25-75 Post: 2.26 L/sec
FEF2575-%CHANGE-POST: 45 %
FEF2575-%Pred-Post: 129 %
FEF2575-%Pred-Pre: 89 %
FEV1-%CHANGE-POST: 10 %
FEV1-%PRED-POST: 72 %
FEV1-%Pred-Pre: 65 %
FEV1-POST: 1.45 L
FEV1-PRE: 1.31 L
FEV1FVC-%Change-Post: 0 %
FEV1FVC-%Pred-Pre: 113 %
FEV6-%CHANGE-POST: 9 %
FEV6-%PRED-POST: 66 %
FEV6-%PRED-PRE: 60 %
FEV6-POST: 1.67 L
FEV6-PRE: 1.52 L
FEV6FVC-%PRED-POST: 105 %
FEV6FVC-%PRED-PRE: 105 %
FVC-%CHANGE-POST: 9 %
FVC-%Pred-Post: 63 %
FVC-%Pred-Pre: 57 %
FVC-Post: 1.67 L
FVC-Pre: 1.52 L
POST FEV1/FVC RATIO: 87 %
POST FEV6/FVC RATIO: 100 %
PRE FEV6/FVC RATIO: 100 %
Pre FEV1/FVC ratio: 86 %
RV % pred: 56 %
RV: 1.14 L
TLC % PRED: 64 %
TLC: 2.95 L

## 2016-09-11 LAB — C-REACTIVE PROTEIN: CRP: 0.3 mg/dL — AB (ref 0.5–20.0)

## 2016-09-11 MED ORDER — ALBUTEROL SULFATE (2.5 MG/3ML) 0.083% IN NEBU
2.5000 mg | INHALATION_SOLUTION | Freq: Once | RESPIRATORY_TRACT | Status: AC
  Administered 2016-09-11: 2.5 mg via RESPIRATORY_TRACT

## 2016-09-11 NOTE — Progress Notes (Signed)
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 Her cancer has been treated with RLL superior subsectomy resection and adjuvant chemotherapy in 2010 with no evidence of recurrence. She was hospitalized in April 2017 for surgery and had two CHF exacerbations since then. She is recently started on oxygen for persistent hypoxia and dyspnea. She's had an echocardiogram this month which showed new RV failure and a follow-up CTA of the chest negative for pulmonary embolus or any other acute pulmonary issue. She has a history of COPD and PFTs from 2012 with minimal obstruction which is maintained on Advair  Interim History: She complains of dyspnea on exertion, unchanged since last visit. Denies any cough, sputum production, fevers, chills, wheezing. Denies any LE edema, chest pain palpitations.   Outpatient Encounter Prescriptions as of 09/11/2016  Medication Sig  . aspirin EC 81 MG tablet Take 81 mg by mouth every evening.  [name] atorvastatin (LIPITOR) 20 MG tablet Take 20 mg by mouth daily.  . celecoxib (CELEBREX) 100 MG capsule Take 1 capsule (100 mg total) by mouth daily as needed for mild pain.  [name] Fluticasone-Salmeterol (ADVAIR) 250-50 MCG/DOSE AEPB Inhale 1 puff into the lungs 2 (two) times daily.  . furosemide (LASIX) 40 MG tablet Take 1 tablet (40 mg total) by mouth 2 (two) times daily. Take one dose at 8 am and then take the 2nd dose at 2 pm every day  . gabapentin (NEURONTIN) 300 MG capsule Take 300 mg by mouth 3 (three) times daily.  [name] GLIPIZIDE XL 5 MG 24 hr tablet Take 5 mg by mouth every morning.  [name] ipratropium-albuterol (DUONEB) 0.5-2.5 (3) MG/3ML SOLN Take 3 mLs  by nebulization every 4 (four) hours as needed (wheezing, Shortness of breath).  . Liraglutide (VICTOZA) 18 MG/3ML SOPN Inject 1.8 mg into the skin daily.  . potassium chloride (K-DUR) 10 MEQ tablet Take 4 tablets (40 mEq total) by mouth daily. Take extra tablet on Wednesdays and Saturdays (when taking Zaroxolyn)  . sertraline (ZOLOFT) 100 MG tablet Take 150 mg by mouth daily.   . sitaGLIPtin-metformin (JANUMET) 50-1000 MG per tablet Take 1 tablet by mouth 2 (two) times daily with a meal.  . spironolactone (ALDACTONE) 25 MG tablet Take 1 tablet (25 mg total) by mouth daily.   Facility-Administered Encounter Medications as of 09/11/2016  Medication  . diatrizoate meglumine-sodium (GASTROGRAFIN) 66-10 % solution 30 mL    Allergies as of 09/11/2016 - Review Complete 09/11/2016  Allergen Reaction Noted  . Amoxicillin Anaphylaxis, Hives, and Rash 09/03/2011    Past Medical History:  Diagnosis Date  . Arthritis   . Cancer ([place])    lung ca  . CHF (congestive heart failure) ([place]) 02/2016  . COPD (chronic obstructive pulmonary disease) ([place])   . Depression   . Diabetes mellitus    [name]    dx  2008  . Hx of seasonal allergies   . Hypercholesteremia   . Hypertension    on no medications  . Lung cancer ([place])   . Pneumonia    2008  @  [name]  . Shortness of breath dyspnea    doe  Past Surgical History:  Procedure Laterality Date  . [facility] in [place]     bilateral cataracts  . LEFT HEART CATHETERIZATION WITH CORONARY ANGIOGRAM N/A 07/12/2014   Procedure: LEFT HEART CATHETERIZATION WITH CORONARY ANGIOGRAM;  Surgeon: [name], MD;  Location: [facility] CATH LAB;  Service: Cardiovascular;  Laterality: N/A;  . MAXIMUM ACCESS (MAS)POSTERIOR LUMBAR INTERBODY FUSION (PLIF) 2 LEVEL N/A 02/22/2016   Procedure: Lumbar three-four - Lumbar four-five  MAXIMUM ACCESS SURGERY  POSTERIOR LUMBAR INTERBODY FUSION;  Surgeon:  [name], MD;  Location: [facility] NEURO ORS;  Service: Neurosurgery;  Laterality: N/A;  . THORACOTOMY Right 2010   lower  . TONSILLECTOMY      Family History  Problem Relation Age of Onset  . Diabetes Father   . Heart failure Father   . Cancer Sister   . Diabetes Brother   . Heart failure Brother     Social History   Social History  . Marital status: Divorced    Spouse name: N/A  . Number of children: N/A  . Years of education: N/A   Occupational History  . Not on file.   Social History Main Topics  . Smoking status: Former Smoker    Packs/day: 1.00    Years: 30.00    Quit date: 07/14/2006  . Smokeless tobacco: Never Used  . Alcohol use No  . Drug use: No  . Sexual activity: Not on file   Other Topics Concern  . Not on file   Social History Narrative  . No narrative on file    Review of systems: Review of Systems  Constitutional: Negative for fever and chills.  HENT: Negative.   Eyes: Negative for blurred vision.  Respiratory: as per HPI  Cardiovascular: Negative for chest pain and palpitations.  Gastrointestinal: Negative for vomiting, diarrhea, blood per rectum. Genitourinary: Negative for dysuria, urgency, frequency and hematuria.  Musculoskeletal: Negative for myalgias, back pain and joint pain.  Skin: Negative for itching and rash.  Neurological: Negative for dizziness, tremors, focal weakness, seizures and loss of consciousness.  Endo/Heme/Allergies: Negative for environmental allergies.  Psychiatric/Behavioral: Negative for depression, suicidal ideas and hallucinations.  All other systems reviewed and are negative.  Physical Exam: Blood pressure 140/80, pulse 77, SpO2 90 %. Gen:      No acute distress, rash over the cheeks HEENT:  EOMI, sclera anicteric Neck:     No masses; no thyromegaly Lungs:    Scattered crackles; normal respiratory effort CV:         Regular rate and rhythm; no murmurs Abd:      + bowel sounds; soft, non-tender; no palpable  masses, no distension Ext:    No edema; adequate peripheral perfusion Skin:      Warm and dry; no rash Neuro: alert and oriented x 3 Psych: normal mood and affect  Data Reviewed: CT scan 07/21/16- No PE, evidence of right heart failure, stable small pericardial effusion , stable mediastinal lymph nodes, stable small right pleural effusion, coronary atherosclerosis. Postsurgical changes.  I have reviewed all images personally  PFTs 01/01/11 FVC 2.02 [77) FEV1 1.63 [86%) F/F 81 TLC 84% DLCO 49% Minimal obstructive airway disease, small airway disease. No restriction Moderate to severe reduction in diffusion capacity  PFTs 09/11/16 FVC 1.67 (62%) FEV1 1.45 (72%) F/F 87 TLC 64% DLCO 31% Restriction with severe diffusion defect. No obstructive lung disease.  Echo 07/16/16 - Compared  to August 2017, there is marked enlargement of the right   ventricle, deterioration in right ventricular function and   increased right atrial pressure, which all appear secondary to   the development of moderate to severe pulmonary artery   hypertension. Consider interval pulmonary embolism or other   causes of worsening cor pulmonale.  Serologies 08/13/16 ANA, CCP, RA, Scl 70 negative c ANCA 1:20 Sed rate 37 HIV negative LFTs- normal except for alk phos 165  Assessment:  Consult for eval of dyspnea, H/O COPD She is a past smoker but suspicion for COPD is low. Her PFTs from 2012 and 2018 shows a very minimal obstructive airway, small airway disease. She is on Advair and I'll continue the same.   Pulmonary HTN with cor pulmonale She has new right ventricular failure of unclear etiology. She appears to be hypoxic requiring upto 6 lt O2. There is no evidence of acute PE or other interstitial abnormalities on the recent CTA of the chest. A basic work up for pulm HTN is normal except for borderline elevation of c-ANCA.   I suspect she has pulmonary arterial hypertension, probable under lying connective  tissue disease even though initial serologies are unremarkable. She will likely need to go on pulmonary vasodilators. I will repeat the connective tissue panel. She will need a right heart cath for estimation of PA pressures and pulmonary vascular resistance. She will also get a V/Q scan for eval of chronic thromboembolism. Sleep study for eval of OSA, nocturnal hypoxia ordered but has not been done yet   Plan/Recommendations: - Sleep study - Repeat serologies for auto immune, connective tissue disease. - Referral for right heart cath - Diuresis per cardiology - Continue advair - Continue supplemental O2  Return in 1 month.  Marshell Garfinkel MD [facility] Pager [phone] 09/11/2016, 12:09 PM  CC: Maurice Small, MD

## 2016-09-11 NOTE — Patient Instructions (Signed)
We will schedule a V/Q scan Repeat blood work for autoimmune serologies  I will check with your cardiologist if we can do a right heart cath  Follow up in 1 month

## 2016-09-12 LAB — SJOGREN'S SYNDROME ANTIBODS(SSA + SSB)
SSA (RO) (ENA) ANTIBODY, IGG: NEGATIVE
SSB (LA) (ENA) ANTIBODY, IGG: NEGATIVE

## 2016-09-12 LAB — C-ANCA TITER

## 2016-09-12 LAB — ANTI-DNA ANTIBODY, DOUBLE-STRANDED: ds DNA Ab: <1 IU/mL

## 2016-09-12 LAB — CENTROMERE ANTIBODIES: Centromere Ab Screen: <1

## 2016-09-12 LAB — JO-1 ANTIBODY-IGG: JO-1 ANTIBODY, IGG: NEGATIVE

## 2016-09-12 LAB — ANCA SCREEN W REFLEX TITER: ANCA SCREEN: POSITIVE — AB

## 2016-09-12 LAB — ANTI-SMITH ANTIBODY: ENA SM Ab Ser-aCnc: <1

## 2016-09-15 LAB — ALDOLASE: ALDOLASE: 4.5 U/L (ref <=8.1)

## 2016-09-18 ENCOUNTER — Encounter (HOSPITAL_COMMUNITY)
Admission: RE | Admit: 2016-09-18 | Discharge: 2016-09-18 | Disposition: A | Discharge status: Home / Self Care | Payer: Self-pay | Source: Ambulatory Visit | Attending: Pulmonary Disease | Admitting: Pulmonary Disease

## 2016-09-18 DIAGNOSIS — R0602 Shortness of breath: Secondary | ICD-10-CM

## 2016-09-18 MED ORDER — TECHNETIUM TO 99M ALBUMIN AGGREGATED
4.1000 | Freq: Once | INTRAVENOUS | Status: AC | PRN
  Administered 2016-09-18: 4.1 via INTRAVENOUS

## 2016-09-18 MED ORDER — TECHNETIUM TC 99M DIETHYLENETRIAME-PENTAACETIC ACID
31.1000 | Freq: Once | INTRAVENOUS | Status: AC | PRN
  Administered 2016-09-18: 31.1 via INTRAVENOUS

## 2016-09-19 LAB — MYOSITIS PANEL III
EJ: NEGATIVE
Jo-1 (WB)*: NEGATIVE
Ku*: NEGATIVE
Mi-2 antibodies*: NEGATIVE
OJ: NEGATIVE
PL-12: NEGATIVE
PL-7: NEGATIVE
PM-Scl 100*: NEGATIVE
PM-Scl 75*: NEGATIVE
RNP: 13 EU/ml
RO-52*: NEGATIVE
SIGNAL RECOGNITION PARTICLE: NEGATIVE

## 2016-09-19 LAB — ANA COMPREHENSIVE PANEL
Anti JO-1: <0.2 AI (ref 0.0–0.9)
DSDNA AB: 1 [IU]/mL (ref 0–9)
ENA RNP Ab: <0.2 AI (ref 0.0–0.9)
ENA SM Ab Ser-aCnc: <0.2 AI (ref 0.0–0.9)
ENA SSA (RO) Ab: <0.2 AI (ref 0.0–0.9)
ENA SSB (LA) Ab: <0.2 AI (ref 0.0–0.9)
Scleroderma SCL-70: <0.2 AI (ref 0.0–0.9)

## 2016-09-30 ENCOUNTER — Encounter (HOSPITAL_BASED_OUTPATIENT_CLINIC_OR_DEPARTMENT_OTHER): Payer: BLUE CROSS/BLUE SHIELD

## 2016-10-17 ENCOUNTER — Ambulatory Visit: Payer: Self-pay | Admitting: Cardiology

## 2016-10-30 ENCOUNTER — Ambulatory Visit: Payer: Self-pay | Admitting: Pulmonary Disease

## 2017-02-09 ENCOUNTER — Encounter (HOSPITAL_COMMUNITY): Payer: Self-pay | Admitting: Emergency Medicine

## 2017-02-09 ENCOUNTER — Emergency Department (HOSPITAL_COMMUNITY): Payer: Medicare Other

## 2017-02-09 ENCOUNTER — Inpatient Hospital Stay (HOSPITAL_COMMUNITY)
Admission: EM | Admit: 2017-02-09 | Discharge: 2017-02-13 | DRG: 291 | Disposition: A | Discharge status: Home / Self Care | Payer: Medicare Other | Attending: Internal Medicine | Admitting: Internal Medicine

## 2017-02-09 DIAGNOSIS — Z9842 Cataract extraction status, left eye: Secondary | ICD-10-CM

## 2017-02-09 DIAGNOSIS — I5023 Acute on chronic systolic (congestive) heart failure: Secondary | ICD-10-CM

## 2017-02-09 DIAGNOSIS — E872 Acidosis, unspecified: Secondary | ICD-10-CM | POA: Diagnosis present

## 2017-02-09 DIAGNOSIS — Z794 Long term (current) use of insulin: Secondary | ICD-10-CM

## 2017-02-09 DIAGNOSIS — E1165 Type 2 diabetes mellitus with hyperglycemia: Secondary | ICD-10-CM | POA: Diagnosis present

## 2017-02-09 DIAGNOSIS — N183 Chronic kidney disease, stage 3 (moderate): Secondary | ICD-10-CM | POA: Diagnosis present

## 2017-02-09 DIAGNOSIS — Z833 Family history of diabetes mellitus: Secondary | ICD-10-CM

## 2017-02-09 DIAGNOSIS — R609 Edema, unspecified: Secondary | ICD-10-CM | POA: Diagnosis not present

## 2017-02-09 DIAGNOSIS — I10 Essential (primary) hypertension: Secondary | ICD-10-CM | POA: Diagnosis not present

## 2017-02-09 DIAGNOSIS — I509 Heart failure, unspecified: Secondary | ICD-10-CM

## 2017-02-09 DIAGNOSIS — Z9841 Cataract extraction status, right eye: Secondary | ICD-10-CM

## 2017-02-09 DIAGNOSIS — Z880 Allergy status to penicillin: Secondary | ICD-10-CM

## 2017-02-09 DIAGNOSIS — Z79899 Other long term (current) drug therapy: Secondary | ICD-10-CM | POA: Diagnosis not present

## 2017-02-09 DIAGNOSIS — I2510 Atherosclerotic heart disease of native coronary artery without angina pectoris: Secondary | ICD-10-CM | POA: Diagnosis present

## 2017-02-09 DIAGNOSIS — Z6835 Body mass index (BMI) 35.0-35.9, adult: Secondary | ICD-10-CM

## 2017-02-09 DIAGNOSIS — E1122 Type 2 diabetes mellitus with diabetic chronic kidney disease: Secondary | ICD-10-CM | POA: Diagnosis present

## 2017-02-09 DIAGNOSIS — N179 Acute kidney failure, unspecified: Secondary | ICD-10-CM | POA: Diagnosis present

## 2017-02-09 DIAGNOSIS — I3139 Other pericardial effusion (noninflammatory): Secondary | ICD-10-CM

## 2017-02-09 DIAGNOSIS — E669 Obesity, unspecified: Secondary | ICD-10-CM | POA: Diagnosis present

## 2017-02-09 DIAGNOSIS — J441 Chronic obstructive pulmonary disease with (acute) exacerbation: Secondary | ICD-10-CM | POA: Diagnosis present

## 2017-02-09 DIAGNOSIS — J9621 Acute and chronic respiratory failure with hypoxia: Secondary | ICD-10-CM | POA: Diagnosis present

## 2017-02-09 DIAGNOSIS — Z981 Arthrodesis status: Secondary | ICD-10-CM

## 2017-02-09 DIAGNOSIS — Z91120 Patient's intentional underdosing of medication regimen due to financial hardship: Secondary | ICD-10-CM

## 2017-02-09 DIAGNOSIS — I5032 Chronic diastolic (congestive) heart failure: Secondary | ICD-10-CM

## 2017-02-09 DIAGNOSIS — Z85118 Personal history of other malignant neoplasm of bronchus and lung: Secondary | ICD-10-CM | POA: Diagnosis not present

## 2017-02-09 DIAGNOSIS — I5033 Acute on chronic diastolic (congestive) heart failure: Secondary | ICD-10-CM | POA: Diagnosis present

## 2017-02-09 DIAGNOSIS — Z7984 Long term (current) use of oral hypoglycemic drugs: Secondary | ICD-10-CM

## 2017-02-09 DIAGNOSIS — E118 Type 2 diabetes mellitus with unspecified complications: Secondary | ICD-10-CM | POA: Diagnosis present

## 2017-02-09 DIAGNOSIS — I2781 Cor pulmonale (chronic): Secondary | ICD-10-CM

## 2017-02-09 DIAGNOSIS — Z87891 Personal history of nicotine dependence: Secondary | ICD-10-CM

## 2017-02-09 DIAGNOSIS — T380X5A Adverse effect of glucocorticoids and synthetic analogues, initial encounter: Secondary | ICD-10-CM | POA: Diagnosis present

## 2017-02-09 DIAGNOSIS — Z7982 Long term (current) use of aspirin: Secondary | ICD-10-CM | POA: Diagnosis not present

## 2017-02-09 DIAGNOSIS — J449 Chronic obstructive pulmonary disease, unspecified: Secondary | ICD-10-CM | POA: Diagnosis present

## 2017-02-09 DIAGNOSIS — J9601 Acute respiratory failure with hypoxia: Secondary | ICD-10-CM | POA: Diagnosis present

## 2017-02-09 DIAGNOSIS — I270 Primary pulmonary hypertension: Secondary | ICD-10-CM

## 2017-02-09 DIAGNOSIS — R0602 Shortness of breath: Secondary | ICD-10-CM

## 2017-02-09 DIAGNOSIS — I130 Hypertensive heart and chronic kidney disease with heart failure and stage 1 through stage 4 chronic kidney disease, or unspecified chronic kidney disease: Principal | ICD-10-CM | POA: Diagnosis present

## 2017-02-09 DIAGNOSIS — I313 Pericardial effusion (noninflammatory): Secondary | ICD-10-CM

## 2017-02-09 DIAGNOSIS — R0902 Hypoxemia: Secondary | ICD-10-CM

## 2017-02-09 LAB — CBC
HCT: 44 % (ref 36.0–46.0)
Hemoglobin: 13.8 g/dL (ref 12.0–15.0)
MCH: 27.8 pg (ref 26.0–34.0)
MCHC: 31.4 g/dL (ref 30.0–36.0)
MCV: 88.7 fL (ref 78.0–100.0)
Platelets: 222 10*3/uL (ref 150–400)
RBC: 4.96 MIL/uL (ref 3.87–5.11)
RDW: 15.3 % (ref 11.5–15.5)
WBC: 6.5 10*3/uL (ref 4.0–10.5)

## 2017-02-09 LAB — PROTIME-INR
INR: 1
Prothrombin Time: 13.2 seconds (ref 11.4–15.2)

## 2017-02-09 LAB — I-STAT TROPONIN, ED: Troponin i, poc: 0.02 ng/mL (ref 0.00–0.08)

## 2017-02-09 LAB — BASIC METABOLIC PANEL
Anion gap: 12 (ref 5–15)
BUN: 28 mg/dL — ABNORMAL HIGH (ref 6–20)
CO2: 30 mmol/L (ref 22–32)
Calcium: 9.3 mg/dL (ref 8.9–10.3)
Chloride: 97 mmol/L — ABNORMAL LOW (ref 101–111)
Creatinine, Ser: 1.39 mg/dL — ABNORMAL HIGH (ref 0.44–1.00)
GFR calc Af Amer: 43 mL/min — ABNORMAL LOW (ref >60)
GFR calc non Af Amer: 37 mL/min — ABNORMAL LOW (ref >60)
Glucose, Bld: 374 mg/dL — ABNORMAL HIGH (ref 65–99)
Potassium: 4.3 mmol/L (ref 3.5–5.1)
Sodium: 139 mmol/L (ref 135–145)

## 2017-02-09 LAB — BRAIN NATRIURETIC PEPTIDE: B Natriuretic Peptide: 957.3 pg/mL — ABNORMAL HIGH (ref 0.0–100.0)

## 2017-02-09 LAB — I-STAT CG4 LACTIC ACID, ED
Lactic Acid, Venous: 1.72 mmol/L (ref 0.5–1.9)
Lactic Acid, Venous: 2.34 mmol/L (ref 0.5–1.9)

## 2017-02-09 MED ORDER — POTASSIUM CHLORIDE CRYS ER 20 MEQ PO TBCR
20.0000 meq | EXTENDED_RELEASE_TABLET | Freq: Every day | ORAL | Status: DC
  Administered 2017-02-10 – 2017-02-13 (×4): 20 meq via ORAL
  Filled 2017-02-09 (×4): qty 1

## 2017-02-09 MED ORDER — GLIPIZIDE ER 10 MG PO TB24
10.0000 mg | ORAL_TABLET | Freq: Every day | ORAL | Status: DC
  Administered 2017-02-10 – 2017-02-11 (×2): 10 mg via ORAL
  Filled 2017-02-09 (×2): qty 1

## 2017-02-09 MED ORDER — ALBUTEROL SULFATE (2.5 MG/3ML) 0.083% IN NEBU
5.0000 mg | INHALATION_SOLUTION | Freq: Once | RESPIRATORY_TRACT | Status: AC
  Administered 2017-02-09: 5 mg via RESPIRATORY_TRACT

## 2017-02-09 MED ORDER — GABAPENTIN 300 MG PO CAPS
300.0000 mg | ORAL_CAPSULE | Freq: Three times a day (TID) | ORAL | Status: DC
  Administered 2017-02-10 – 2017-02-13 (×10): 300 mg via ORAL
  Filled 2017-02-09 (×10): qty 1

## 2017-02-09 MED ORDER — METHYLPREDNISOLONE SODIUM SUCC 125 MG IJ SOLR
125.0000 mg | Freq: Once | INTRAMUSCULAR | Status: AC
  Administered 2017-02-09: 125 mg via INTRAVENOUS
  Filled 2017-02-09: qty 2

## 2017-02-09 MED ORDER — FUROSEMIDE 10 MG/ML IJ SOLN
60.0000 mg | Freq: Two times a day (BID) | INTRAMUSCULAR | Status: DC
  Administered 2017-02-10 – 2017-02-12 (×5): 60 mg via INTRAVENOUS
  Filled 2017-02-09 (×6): qty 6

## 2017-02-09 MED ORDER — IPRATROPIUM-ALBUTEROL 0.5-2.5 (3) MG/3ML IN SOLN: 3.0000 mL | RESPIRATORY_TRACT | Status: DC | PRN

## 2017-02-09 MED ORDER — BUDESONIDE 0.25 MG/2ML IN SUSP
0.2500 mg | Freq: Two times a day (BID) | RESPIRATORY_TRACT | Status: DC
  Administered 2017-02-10 – 2017-02-13 (×7): 0.25 mg via RESPIRATORY_TRACT
  Filled 2017-02-09 (×7): qty 2

## 2017-02-09 MED ORDER — ACETAMINOPHEN 650 MG RE SUPP: 650.0000 mg | Freq: Four times a day (QID) | RECTAL | Status: DC | PRN

## 2017-02-09 MED ORDER — IPRATROPIUM-ALBUTEROL 0.5-2.5 (3) MG/3ML IN SOLN
3.0000 mL | RESPIRATORY_TRACT | Status: DC
  Administered 2017-02-10 (×3): 3 mL via RESPIRATORY_TRACT
  Filled 2017-02-09 (×3): qty 3

## 2017-02-09 MED ORDER — ASPIRIN EC 81 MG PO TBEC
81.0000 mg | DELAYED_RELEASE_TABLET | Freq: Every evening | ORAL | Status: DC
  Administered 2017-02-10 – 2017-02-12 (×4): 81 mg via ORAL
  Filled 2017-02-09 (×4): qty 1

## 2017-02-09 MED ORDER — SERTRALINE HCL 100 MG PO TABS
150.0000 mg | ORAL_TABLET | Freq: Every day | ORAL | Status: DC
  Administered 2017-02-10 – 2017-02-13 (×4): 150 mg via ORAL
  Filled 2017-02-09 (×4): qty 1

## 2017-02-09 MED ORDER — SPIRONOLACTONE 25 MG PO TABS
25.0000 mg | ORAL_TABLET | Freq: Every day | ORAL | Status: DC
  Administered 2017-02-10 – 2017-02-11 (×2): 25 mg via ORAL
  Filled 2017-02-09 (×2): qty 1

## 2017-02-09 MED ORDER — INSULIN ASPART 100 UNIT/ML ~~LOC~~ SOLN
0.0000 [IU] | Freq: Three times a day (TID) | SUBCUTANEOUS | Status: DC
  Administered 2017-02-10 (×3): 7 [IU] via SUBCUTANEOUS
  Administered 2017-02-11 – 2017-02-12 (×2): 5 [IU] via SUBCUTANEOUS
  Administered 2017-02-12: 2 [IU] via SUBCUTANEOUS
  Administered 2017-02-12: 3 [IU] via SUBCUTANEOUS
  Administered 2017-02-13: 5 [IU] via SUBCUTANEOUS
  Administered 2017-02-13: 3 [IU] via SUBCUTANEOUS

## 2017-02-09 MED ORDER — ENOXAPARIN SODIUM 40 MG/0.4ML ~~LOC~~ SOLN
40.0000 mg | SUBCUTANEOUS | Status: DC
  Administered 2017-02-10 – 2017-02-13 (×4): 40 mg via SUBCUTANEOUS
  Filled 2017-02-09 (×4): qty 0.4

## 2017-02-09 MED ORDER — ALBUTEROL SULFATE (2.5 MG/3ML) 0.083% IN NEBU
INHALATION_SOLUTION | RESPIRATORY_TRACT | Status: AC
  Filled 2017-02-09: qty 6

## 2017-02-09 MED ORDER — ACETAMINOPHEN 325 MG PO TABS
650.0000 mg | ORAL_TABLET | Freq: Four times a day (QID) | ORAL | Status: DC | PRN
  Administered 2017-02-10 – 2017-02-12 (×2): 650 mg via ORAL
  Filled 2017-02-09 (×3): qty 2

## 2017-02-09 NOTE — ED Triage Notes (Signed)
Pt to ER from home with complaint of bilateral leg swelling and shortness of breath onset 2 weeks ago. States has progressively worsened. Audible wheezing. Patient lips and nose cyanotic on arrival, placed on monitor found to have spO2 @ 73%. A/o x4. 100% with neb treatment at this time. States hx of CHF and COPD.

## 2017-02-09 NOTE — ED Notes (Signed)
Called pt name x3 to be roomed. No response. 

## 2017-02-09 NOTE — ED Provider Notes (Signed)
[facility] DEPT Provider Note   CSN: [id number] Arrival date & time: 02/09/17  1455     History   Chief Complaint Chief Complaint  Patient presents with  . Shortness of Breath    HPI Amy Shepard is a 71 y.o. female.  The history is provided by the patient and medical records.  Shortness of Breath  This is a recurrent problem. The average episode lasts 2 weeks. The problem occurs continuously.The current episode started more than 1 week ago. The problem has been rapidly worsening. Associated symptoms include cough, sputum production, wheezing and leg swelling. Pertinent negatives include no fever, no headaches, no rhinorrhea, no neck pain, no hemoptysis, no chest pain, no syncope, no vomiting, no abdominal pain, no rash and no leg pain. She has tried nothing for the symptoms. The treatment provided no relief. She has had prior hospitalizations. Associated medical issues include COPD, CAD and heart failure. Associated medical issues do not include recent surgery.    Past Medical History:  Diagnosis Date  . Arthritis   . Cancer ([place])    lung ca  . CHF (congestive heart failure) ([name]) 02/2016  . COPD (chronic obstructive pulmonary disease) ([place])   . Depression   . Diabetes mellitus    [name]    dx  2008  . Hx of seasonal allergies   . Hypercholesteremia   . Hypertension    on no medications  . Lung cancer ([place])   . Pneumonia    2008  @  [name]  . Shortness of breath dyspnea    doe    Patient Active Problem List   Diagnosis Date Noted  . Acute on chronic diastolic CHF (congestive heart failure) ([place]) 06/23/2016  . HTN (hypertension) 06/23/2016  . Diabetes mellitus type 2 in obese ([place]) 03/11/2016  . Depression 03/11/2016  . COPD (chronic obstructive pulmonary disease) ([place]) 03/10/2016  . S/P lumbar spinal fusion 02/22/2016  . Coronary artery disease involving native coronary artery 07/12/2014  . Pain in the chest   . Malignant neoplasm of lower lobe  of right lung ([place]) 05/25/2014  . Lung cancer ([place]) 09/01/2012    Past Surgical History:  Procedure Laterality Date  . [facility] or [facility] in [place]     bilateral cataracts  . LEFT HEART CATHETERIZATION WITH CORONARY ANGIOGRAM N/A 07/12/2014   Procedure: LEFT HEART CATHETERIZATION WITH CORONARY ANGIOGRAM;  Surgeon: [name], MD;  Location: [facility] CATH LAB;  Service: Cardiovascular;  Laterality: N/A;  . MAXIMUM ACCESS (MAS)POSTERIOR LUMBAR INTERBODY FUSION (PLIF) 2 LEVEL N/A 02/22/2016   Procedure: Lumbar three-four - Lumbar four-five  MAXIMUM ACCESS SURGERY  POSTERIOR LUMBAR INTERBODY FUSION;  Surgeon: [name], MD;  Location: [facility]UR[facility] ORS;  Service: Neurosurgery;  Laterality: N/A;  . THORACOTOMY Right 2010   lower  . TONSILLECTOMY      OB History    No data available       Home Medications    Prior to Admission medications   Medication Sig Start Date End Date Taking? Authorizing Provider  aspirin EC 81 MG tablet Take 81 mg by mouth every evening.    [provider]  atorvastatin (LIPITOR) 20 MG tablet Take 20 mg by mouth daily.    [provider]  celecoxib (CELEBREX) 100 MG capsule Take 1 capsule (100 mg total) by mouth daily as needed for mild pain. 03/13/16   Johnson, [place],  MD  Fluticasone-Salmeterol (ADVAIR) 250-50 MCG/DOSE AEPB Inhale 1 puff into the lungs 2 (two) times daily.    [provider]  furosemide (LASIX) 40 MG tablet Take 1 tablet (40 mg total) by mouth 2 (two) times daily. Take one dose at 8 am and then take the 2nd dose at 2 pm every day 07/01/16   Dorothy Spark, MD  gabapentin (NEURONTIN) 300 MG capsule Take 300 mg by mouth 3 (three) times daily.    [provider]  GLIPIZIDE XL 5 MG 24 hr tablet Take 5 mg by mouth every morning. 07/01/16   [provider]  ipratropium-albuterol (DUONEB) 0.5-2.5 (3) MG/3ML SOLN Take 3 mLs by nebulization every 4  (four) hours as needed (wheezing, Shortness of breath). 03/13/16   Johnson, Clanford L, MD  Liraglutide (VICTOZA) 18 MG/3ML SOPN Inject 1.8 mg into the skin daily.    [provider]  potassium chloride (K-DUR) 10 MEQ tablet Take 4 tablets (40 mEq total) by mouth daily. Take extra tablet on Wednesdays and Saturdays (when taking Zaroxolyn) 07/29/16   Burtis Junes, NP  sertraline (ZOLOFT) 100 MG tablet Take 150 mg by mouth daily.     [provider]  sitaGLIPtin-metformin (JANUMET) 50-1000 MG per tablet Take 1 tablet by mouth 2 (two) times daily with a meal.    [provider]  spironolactone (ALDACTONE) 25 MG tablet Take 1 tablet (25 mg total) by mouth daily. 09/05/16 12/04/16  Dorothy Spark, MD    Family History Family History  Problem Relation Age of Onset  . Diabetes Father   . Heart failure Father   . Cancer Sister   . Diabetes Brother   . Heart failure Brother     Social History Social History  Substance Use Topics  . Smoking status: Former Smoker    Packs/day: 1.00    Years: 30.00    Quit date: 07/14/2006  . Smokeless tobacco: Never Used  . Alcohol use No     Allergies   Amoxicillin   Review of Systems Review of Systems  Constitutional: Negative for appetite change, chills, diaphoresis, fatigue and fever.  HENT: Negative for congestion and rhinorrhea.   Eyes: Negative for visual disturbance.  Respiratory: Positive for cough, sputum production, chest tightness, shortness of breath and wheezing. Negative for hemoptysis and stridor.   Cardiovascular: Positive for leg swelling. Negative for chest pain and syncope.  Gastrointestinal: Negative for abdominal pain, diarrhea, nausea and vomiting.  Genitourinary: Positive for dysuria. Negative for decreased urine volume and pelvic pain.  Musculoskeletal: Positive for back pain. Negative for neck pain and neck stiffness.  Skin: Negative for rash and wound.  Neurological: Negative for weakness,  light-headedness, numbness and headaches.  Psychiatric/Behavioral: Negative for agitation and confusion.  All other systems reviewed and are negative.    Physical Exam Updated Vital Signs BP 125/73   Pulse 85   Temp 97.9 F (36.6 C) (Oral)   Resp 14   SpO2 (!) 80%   Physical Exam  Constitutional: She is oriented to person, place, and time. She appears well-developed and well-nourished. No distress.  HENT:  Head: Normocephalic.  Mouth/Throat: Oropharynx is clear and moist. No oropharyngeal exudate.  Eyes: Pupils are equal, round, and reactive to light. Conjunctivae and EOM are normal.  Neck: Normal range of motion.  Cardiovascular: Normal rate and intact distal pulses.   No murmur heard. Pulmonary/Chest: Effort normal. No stridor. No respiratory distress. She has no wheezes. She has rales. She exhibits no tenderness.  Abdominal: There is no tenderness.  Musculoskeletal: She exhibits edema. She exhibits no tenderness.  Neurological: She is alert and oriented to person, place, and time. No sensory deficit. She exhibits normal muscle tone.  Skin: Capillary refill takes less than 2 seconds. No rash noted. She is not diaphoretic. No erythema.  Psychiatric: She has a normal mood and affect.  Nursing note and vitals reviewed.    ED Treatments / Results  Labs (all labs ordered are listed, but only abnormal results are displayed) Labs Reviewed  BASIC METABOLIC PANEL - Abnormal; Notable for the following:       Result Value   Chloride 97 (*)    Glucose, Bld 374 (*)    BUN 28 (*)    Creatinine, Ser 1.39 (*)    GFR calc non Af Amer 37 (*)    GFR calc Af Amer 43 (*)    All other components within normal limits  BRAIN NATRIURETIC PEPTIDE - Abnormal; Notable for the following:    B Natriuretic Peptide 957.3 (*)    All other components within normal limits  I-STAT CG4 LACTIC ACID, ED - Abnormal; Notable for the following:    Lactic Acid, Venous 2.34 (*)    All other components  within normal limits  URINE CULTURE  CBC  PROTIME-INR  URINALYSIS, ROUTINE W REFLEX MICROSCOPIC  BASIC METABOLIC PANEL  CBC WITH DIFFERENTIAL/PLATELET  MAGNESIUM  TSH  CBC  CREATININE, SERUM  I-STAT TROPONIN, ED  I-STAT CG4 LACTIC ACID, ED    EKG  EKG Interpretation  Date/Time:  Monday February 09 2017 15:06:41 EDT Ventricular Rate:  73 PR Interval:  156 QRS Duration: 78 QT Interval:  364 QTC Calculation: 401 R Axis:   -103 Text Interpretation:  Normal sinus rhythm Right superior axis deviation Pulmonary disease pattern Right ventricular hypertrophy Nonspecific ST and T wave abnormality Abnormal ECG Confirmed by Virgel Manifold [phone]) on 02/09/2017 3:33:12 PM       Radiology Dg Chest 2 View  Result Date: 02/09/2017 CLINICAL DATA:  Shortness of breath and chest pain. EXAM: CHEST  2 VIEW COMPARISON:  Chest x-ray dated September 18, 2016. FINDINGS: Unchanged cardiomegaly. Atherosclerotic calcification of the aortic arch. Postsurgical changes in the right lower lobe are unchanged. Mild increased pulmonary venous congestion. Increased interstitial prominence, likely representing edema. No focal consolidation, pleural effusion, or pneumothorax. No acute osseous abnormality. Osteopenia. Partially visualized lumbar spinal hardware. IMPRESSION: 1. Cardiomegaly with mild pulmonary edema. 2.  Aortic Atherosclerosis (ICD10-I70.0). Electronically Signed   By: Titus Dubin M.D.   On: 02/09/2017 15:55    Procedures Procedures (including critical care time)  Medications Ordered in ED Medications  spironolactone (ALDACTONE) tablet 25 mg (not administered)  glipiZIDE (GLUCOTROL XL) 24 hr tablet 10 mg (not administered)  ipratropium-albuterol (DUONEB) 0.5-2.5 (3) MG/3ML nebulizer solution 3 mL (not administered)  aspirin EC tablet 81 mg (not administered)  gabapentin (NEURONTIN) capsule 300 mg (not administered)  sertraline (ZOLOFT) tablet 150 mg (not administered)  ipratropium-albuterol  (DUONEB) 0.5-2.5 (3) MG/3ML nebulizer solution 3 mL (not administered)  budesonide (PULMICORT) nebulizer solution 0.25 mg (not administered)  acetaminophen (TYLENOL) tablet 650 mg (not administered)    Or  acetaminophen (TYLENOL) suppository 650 mg (not administered)  insulin aspart (novoLOG) injection 0-9 Units (not administered)  furosemide (LASIX) injection 60 mg (not administered)  potassium chloride SA (K-DUR,KLOR-CON) CR tablet 20 mEq (not administered)  enoxaparin (LOVENOX) injection 40 mg (not administered)  albuterol (PROVENTIL) (2.5 MG/3ML) 0.083% nebulizer solution 5 mg (5 mg Nebulization Given  02/09/17 1510)  methylPREDNISolone sodium succinate (SOLU-MEDROL) 125 mg/2 mL injection 125 mg (125 mg Intravenous Given 02/09/17 1626)     Initial Impression / Assessment and Plan / ED Course  I have reviewed the triage vital signs and the nursing notes.  Pertinent labs & imaging results that were available during my care of the patient were reviewed by me and considered in my medical decision making (see chart for details).     Amy Shepard is a 71 y.o. female with past medical history significant for CAD, CHF, lung cancer, diabetes, hypertension, and home oxygen requirement who presents with respiratory distress and shortness of breath. Patient is brought in by EMS who reported on intake that patient was satting in the 70s and cyanotic on arrival. Patient says that for the last 2 weeks she has had gradually worsening shortness of breath. Of note, patient says that she has not been able to afford her oxygen for the last week. She is trying to get started soon. She reports that she has not had any chest pain but says in the last 2 weeks she has had worsening bilateral lower extremity edema and swelling in her abdomen and arms. She says this is consistent with CHF exacerbations for her. She denies any fevers, chills, abdominal pain, nausea, or vomiting. She denies diaphoresis. She does  report a productive yellow cough which she says is chronic. She does report some mild back pain which she thinks is secondary to "floating around the oxygen". She does describe some dysuria but denies any constipation or diarrhea.  Patient was given a breathing treatment within out and after arrival. Patient has no significant wheezing on my exam but does have crackles in bilateral bases. Patient is satting in the upper 80s to low 90s on nasal cannula oxygen. Patient's abdomen is nontender. Patient has bilateral extremity edema with no evidence of cellulitis or tenderness. No focal neurologic deficits present.  EKG showed no significant change from prior. Patient will have diagnostic laboratory testing to look for CHF exacerbation versus pneumonia versus other etiology of symptoms.  Patient was given steroids due to concern for COPD exacerbation component.  Anticipate patient will admission due to the severity of her cyanosis, hypoxia, and the fact the patient does not have oxygen at home currently.     6:32 PM Diagnostic testing returned as seen above. Creatinine slightly worsened than prior. BNP is significantly elevated and is now 957.3. In the setting of the crackles on the lungs, chest x-ray showing edema, and swollen legs with her hypoxia and shortness of breath, suspect component of CHF exacerbation. With the wheezing and response to albuterol and steroids, also feel there is a component of COPD exacerbation.  Patient has no leukocytosis and no evidence of pneumonia on x-ray.  Social worker also was involved in patient's care. They report that patient has no ability to have oxygen at home at this time. They will help try and coordinate this.  Given her CHF/COPD exacerbation and her inability to have oxygen and her continued oxygen requirement currently, patient will need admission.     Final Clinical Impressions(s) / ED Diagnoses   Final diagnoses:  COPD exacerbation ([place])  Acute on  chronic congestive heart failure, unspecified heart failure type ([name])  Hypoxia  SOB (shortness of breath)  Edema, unspecified type     Clinical Impression: 1. COPD exacerbation ([place])   2. Acute on chronic congestive heart failure, unspecified heart failure type ([place])   3. Hypoxia  4. SOB (shortness of breath)   5. Edema, unspecified type     Disposition: Admit to Hospitalist service      Horald Birky, Gwenyth Allegra, MD 02/10/17 1329

## 2017-02-09 NOTE — ED Notes (Signed)
Pt in xray.

## 2017-02-09 NOTE — ED Notes (Addendum)
Pt in X-Ray, X-ray to bring pt to room

## 2017-02-09 NOTE — ED Notes (Signed)
Pt called out d/t incontinent episode. No urine sample able to be obtained at this time.  Pt made aware need for sample.

## 2017-02-09 NOTE — H&P (Signed)
History and Physical    Alieah Brinton VEL:[id number] DOB: [birth year] DOA: 02/09/2017  PCP: Maurice Small, MD  Patient coming from: Home.  Chief Complaint: Shortness of breath.  HPI: Amy Shepard is a 71 y.o. female with history of diastolic CHF last EF measured in August 2017 was 60-65% with grade 2 diastolic dysfunction, COPD, lung cancer in remission, diabetes mellitus presents to the ER with complaints of increasing shortness of breath. Patient has been progressively getting short of breath on exertion and on lying flat last 2 weeks. Patient states she has been compliant with her Lasix which she takes twice daily. Denies any chest pain fever chills or productive cough. Patient has noticed increasing swelling in the lower extremity is and she feels that she has put on weight.   ED Course: In the ER patient was found to be wheezing bilaterally with elevated JVD. Chest x-ray shows pulmonary edema. BNP was 957 troponin was negative. Since patient was wheezing patient also was given nebulizer treatment and steroids and Lasix and admitted for acute respiratory failure.  Review of Systems: As per HPI, rest all negative.   Past Medical History:  Diagnosis Date  . Arthritis   . Cancer ([place])    lung ca  . CHF (congestive heart failure) ([place]) 02/2016  . COPD (chronic obstructive pulmonary disease) ([place])   . Depression   . Diabetes mellitus    [name]    dx  2008  . Hx of seasonal allergies   . Hypercholesteremia   . Hypertension    on no medications  . Lung cancer ([place])   . Pneumonia    2008  @  [name]  . Shortness of breath dyspnea    doe    Past Surgical History:  Procedure Laterality Date  . [facility] [place]     bilateral cataracts  . LEFT HEART CATHETERIZATION WITH CORONARY ANGIOGRAM N/A 07/12/2014   Procedure: LEFT HEART CATHETERIZATION WITH CORONARY ANGIOGRAM;  Surgeon: [name], MD;   Location: [facility] CATH LAB;  Service: Cardiovascular;  Laterality: N/A;  . MAXIMUM ACCESS (MAS)POSTERIOR LUMBAR INTERBODY FUSION (PLIF) 2 LEVEL N/A 02/22/2016   Procedure: Lumbar three-four - Lumbar four-five  MAXIMUM ACCESS SURGERY  POSTERIOR LUMBAR INTERBODY FUSION;  Surgeon: [name], MD;  Location: [facility];  Service: Neurosurgery;  Laterality: N/A;  . THORACOTOMY Right 2010   lower  . TONSILLECTOMY       reports that she quit smoking about 10 years ago. She has a 30.00 pack-year smoking history. She has never used smokeless tobacco. She reports that she does not drink alcohol or use drugs.  Allergies  Allergen Reactions  . Amoxicillin Anaphylaxis, Hives and Rash    Has patient had a PCN reaction causing immediate rash, facial/tongue/throat swelling, SOB or lightheadedness with hypotension: YES Positive reaction causing SEVERE RASH INVOLVING MUCUS MEMBRANES/SKIN NECROSIS: YES Reaction that required HOSPITALIZATION: YES Reaction occurring within the last 10 years: NO    Family History  Problem Relation Age of Onset  . Diabetes Father   . Heart failure Father   . Cancer Sister   . Diabetes Brother   . Heart failure Brother     Prior to Admission medications   Medication Sig Start Date End Date Taking? Authorizing Provider  aspirin EC 81 MG tablet Take 81 mg by mouth every evening.   Yes [provider]  Fluticasone-Salmeterol (ADVAIR) 250-50 MCG/DOSE AEPB Inhale 1 puff into the lungs 2 (two) times daily.   Yes [provider]  furosemide (LASIX) 40 MG tablet Take 1 tablet (40 mg total) by mouth 2 (two) times daily. Take one dose at 8 am and then take the 2nd dose at 2 pm every day 07/01/16  Yes Dorothy Spark, MD  gabapentin (NEURONTIN) 300 MG capsule Take 300 mg by mouth 3 (three) times daily.   Yes [provider]  GLIPIZIDE XL 5 MG 24 hr tablet Take 10 mg by mouth every morning.  07/01/16  Yes [provider]  ipratropium-albuterol  (DUONEB) 0.5-2.5 (3) MG/3ML SOLN Take 3 mLs by nebulization every 4 (four) hours as needed (wheezing, Shortness of breath). 03/13/16  Yes Johnson, Clanford L, MD  sertraline (ZOLOFT) 100 MG tablet Take 150 mg by mouth daily.    Yes [provider]  spironolactone (ALDACTONE) 25 MG tablet Take 1 tablet (25 mg total) by mouth daily. 09/05/16 02/09/17 Yes Dorothy Spark, MD  celecoxib (CELEBREX) 100 MG capsule Take 1 capsule (100 mg total) by mouth daily as needed for mild pain. Patient not taking: Reported on 02/09/2017 03/13/16   Murlean Iba, MD  potassium chloride (K-DUR) 10 MEQ tablet Take 4 tablets (40 mEq total) by mouth daily. Take extra tablet on Wednesdays and Saturdays (when taking Zaroxolyn) Patient not taking: Reported on 02/09/2017 07/29/16   Burtis Junes, NP    Physical Exam: Vitals:   02/09/17 1945 02/09/17 2000 02/09/17 2015 02/09/17 2030  BP: (!) 155/78 (!) 148/84 (!) 153/85 (!) 158/84  Pulse: 84 83 83 85  Resp: 19 (!) 23 18 (!) 21  Temp:      TempSrc:      SpO2: 92% 95% 95% 94%      Constitutional: Moderately built and nourished. Vitals:   02/09/17 1945 02/09/17 2000 02/09/17 2015 02/09/17 2030  BP: (!) 155/78 (!) 148/84 (!) 153/85 (!) 158/84  Pulse: 84 83 83 85  Resp: 19 (!) 23 18 (!) 21  Temp:      TempSrc:      SpO2: 92% 95% 95% 94%   Eyes: Anicteric no pallor. ENMT: No discharge from the ears eyes nose and mouth. Neck: JVD elevated no mass felt. Respiratory: Bilateral expiratory wheezes heard no crepitations. Cardiovascular: S1-S2 heard no murmurs appreciated. Abdomen: Soft nontender bowel sounds present. No guarding or rigidity. Musculoskeletal: Bilateral lower extremity edema no joint effusion. Skin: No rash. Skin appears warm. Neurologic: Alert awake oriented to time place and person. Moves all extremities. Psychiatric: Appears normal. Normal affect.   Labs on Admission: I have personally reviewed following labs and imaging  studies  CBC:  Recent Labs Lab 02/09/17 1518  WBC 6.5  HGB 13.8  HCT 44.0  MCV 88.7  PLT 194   Basic Metabolic Panel:  Recent Labs Lab 02/09/17 1518  NA 139  K 4.3  CL 97*  CO2 30  GLUCOSE 374*  BUN 28*  CREATININE 1.39*  CALCIUM 9.3   GFR: CrCl cannot be calculated (Unknown ideal weight.). Liver Function Tests: No results for input(s): AST, ALT, ALKPHOS, BILITOT, PROT, ALBUMIN in the last 168 hours. No results for input(s): LIPASE, AMYLASE in the last 168 hours. No results for input(s): AMMONIA in the last 168 hours. Coagulation Profile:  Recent Labs Lab 02/09/17 1630  INR 1.00   Cardiac Enzymes: No results for input(s): CKTOTAL, CKMB, CKMBINDEX, TROPONINI in the last 168 hours. BNP (last 3 results)  Recent Labs  07/21/16 1210 07/29/16 1159 09/05/16 1131  PROBNP 2,696* 3,611* 2,929*   HbA1C: No results for input(s): HGBA1C in the last 72 hours. CBG: No results for input(s): GLUCAP in the last 168 hours. Lipid Profile: No results for input(s): CHOL, HDL, LDLCALC, TRIG, CHOLHDL, LDLDIRECT in the last 72 hours. Thyroid Function Tests: No results for input(s): TSH, T4TOTAL, FREET4, T3FREE, THYROIDAB in the last 72 hours. Anemia Panel: No results for input(s): VITAMINB12, FOLATE, FERRITIN, TIBC, IRON, RETICCTPCT in the last 72 hours. Urine analysis:    Component Value Date/Time   COLORURINE YELLOW 06/24/2016 [place] 06/24/2016 1403   LABSPEC 1.006 06/24/2016 1403   PHURINE 5.0 06/24/2016 1403   GLUCOSEU NEGATIVE 06/24/2016 1403   HGBUR SMALL (A) 06/24/2016 1403   BILIRUBINUR NEGATIVE 06/24/2016 1403   KETONESUR NEGATIVE 06/24/2016 1403   PROTEINUR NEGATIVE 06/24/2016 1403   UROBILINOGEN 0.2 02/10/2013 1041   NITRITE POSITIVE (A) 06/24/2016 1403   LEUKOCYTESUR SMALL (A) 06/24/2016 1403   Sepsis Labs: _0 (procalcitonin:4,lacticidven:4) )No results found for this or any previous visit (from the past 240 hour(s)).    Radiological Exams on Admission: Dg Chest 2 View  Result Date: 02/09/2017 CLINICAL DATA:  Shortness of breath and chest pain. EXAM: CHEST  2 VIEW COMPARISON:  Chest x-ray dated September 18, 2016. FINDINGS: Unchanged cardiomegaly. Atherosclerotic calcification of the aortic arch. Postsurgical changes in the right lower lobe are unchanged. Mild increased pulmonary venous congestion. Increased interstitial prominence, likely representing edema. No focal consolidation, pleural effusion, or pneumothorax. No acute osseous abnormality. Osteopenia. Partially visualized lumbar spinal hardware. IMPRESSION: 1. Cardiomegaly with mild pulmonary edema. 2.  Aortic Atherosclerosis (ICD10-I70.0). Electronically Signed   By: Titus Dubin M.D.   On: 02/09/2017 15:55    EKG: Independently reviewed. Normal sinus rhythm with nonspecific ST-T changes.  Assessment/Plan Principal Problem:   Acute respiratory failure with hypoxia ([name]) Active Problems:   Coronary artery disease involving native coronary artery   COPD (chronic obstructive pulmonary disease) ([name])   Diabetes mellitus type 2 in obese ([name])   Acute on chronic diastolic CHF (congestive heart failure) ([name])   HTN (hypertension)   CHF (congestive heart failure) ([place])    1. Acute respiratory failure with hypoxia likely from CHF exacerbation with COPD - patient symptoms are more consistent with CHF exacerbation. Patient does have wheezing and contribution from COPD. I have placed patient on Lasix 60 mg IV every 12 along with patient's home dose of spironolactone. No ARB or ACE due to renal failure. Patient's last known EF was in 03/02/16  60 to 65% with grade 2 diastolic dysfunction. For the COPD patient did receive IV Solu-Medrol in the ER we will continue with Pulmicort and nebulizer for now and further doses of prednisone to be decided if needed. 2. Diabetes mellitus type 2 with hyperglycemia - patient takes Glucotrol XL. Patient's blood sugar is increased  due to steroid dose. I will order 1 dose of Lantus and place patient on sliding scale coverage and continue patient's home dose of Glucotrol. If creatinine worsens may have to hold Glucotrol. 3. Hypertension on Lasix and spironolactone. 4. Acute on chronic renal failure stage III - creatinine has increased from recent past. Patient is on increased dose of IV Lasix closely follow metabolic panel intake output. 5. History of lung cancer in remission.  I have reviewed patient's old charts and labs.   DVT prophylaxis: Lovenox. Code Status: Full code.  Family Communication: Discussed with patient.  Disposition Plan: Home.  Consults called: None.  Admission status: Inpatient.    Rise Patience MD [facility] Pager [phone].  If 7PM-7AM, please contact night-coverage [email or web address] Password [email or web address]  02/09/2017, 10:23 PM

## 2017-02-09 NOTE — ED Notes (Signed)
2nd attempt for report, charge nurse busy

## 2017-02-09 NOTE — Care Management (Signed)
ED CM received consult from [place] on Pod E. Patient presented to [facility] ED wth SOB. Patient reported to staff that she has been cutting back on her oxygen because she cannot afford to pay the bill. CM met with patient at bedside patient reports haviing Medicare Part A but does not have Part B that pays for outpatient services. Patient states, she recently stopped working and paperwork was not processed in time for Part B.  patient has been without coverage for several months, she has also been without several of her medications as well. Patient states she now has forms completed to submit for Medicare Part B.  Patient said she will have a family member take form to [facility] office ED evaluation still in progress. CM will continue to follow for transitional care planning. This patient will also benefit from [facility] Management, CM will place a [facility] referral.

## 2017-02-09 NOTE — ED Notes (Signed)
Pt noted to have oxygen saturation of 81% on RA. Pt placed on 3L [place] and saturations improved to 97%. Will cont to monitor.

## 2017-02-09 NOTE — ED Notes (Signed)
Pt aware urine sample needed

## 2017-02-09 NOTE — ED Notes (Signed)
Terry in main lab to add on BNP

## 2017-02-09 NOTE — ED Notes (Signed)
Report attempted 

## 2017-02-10 DIAGNOSIS — E872 Acidosis, unspecified: Secondary | ICD-10-CM | POA: Diagnosis present

## 2017-02-10 DIAGNOSIS — E0865 Diabetes mellitus due to underlying condition with hyperglycemia: Secondary | ICD-10-CM

## 2017-02-10 DIAGNOSIS — J9621 Acute and chronic respiratory failure with hypoxia: Secondary | ICD-10-CM

## 2017-02-10 LAB — CBC WITH DIFFERENTIAL/PLATELET
BASOS ABS: 0 10*3/uL (ref 0.0–0.1)
BASOS PCT: 0 %
Eosinophils Absolute: 0 10*3/uL (ref 0.0–0.7)
Eosinophils Relative: 0 %
HEMATOCRIT: 44.8 % (ref 36.0–46.0)
Hemoglobin: 14.1 g/dL (ref 12.0–15.0)
LYMPHS PCT: 6 %
Lymphs Abs: 0.4 10*3/uL — ABNORMAL LOW (ref 0.7–4.0)
MCH: 27.9 pg (ref 26.0–34.0)
MCHC: 31.5 g/dL (ref 30.0–36.0)
MCV: 88.5 fL (ref 78.0–100.0)
Monocytes Absolute: 0.2 10*3/uL (ref 0.1–1.0)
Monocytes Relative: 3 %
NEUTROS ABS: 6.3 10*3/uL (ref 1.7–7.7)
NEUTROS PCT: 91 %
Platelets: 234 10*3/uL (ref 150–400)
RBC: 5.06 MIL/uL (ref 3.87–5.11)
RDW: 14.8 % (ref 11.5–15.5)
WBC: 6.9 10*3/uL (ref 4.0–10.5)

## 2017-02-10 LAB — CBC
HEMATOCRIT: 43.6 % (ref 36.0–46.0)
Hemoglobin: 13.7 g/dL (ref 12.0–15.0)
MCH: 27.8 pg (ref 26.0–34.0)
MCHC: 31.4 g/dL (ref 30.0–36.0)
MCV: 88.6 fL (ref 78.0–100.0)
PLATELETS: 234 10*3/uL (ref 150–400)
RBC: 4.92 MIL/uL (ref 3.87–5.11)
RDW: 14.7 % (ref 11.5–15.5)
WBC: 6.5 10*3/uL (ref 4.0–10.5)

## 2017-02-10 LAB — URINALYSIS, ROUTINE W REFLEX MICROSCOPIC
BACTERIA UA: NONE SEEN
BILIRUBIN URINE: NEGATIVE
Ketones, ur: NEGATIVE mg/dL
NITRITE: POSITIVE — AB
PH: 6 (ref 5.0–8.0)
Protein, ur: 100 mg/dL — AB
SPECIFIC GRAVITY, URINE: 1.015 (ref 1.005–1.030)

## 2017-02-10 LAB — GLUCOSE, CAPILLARY
GLUCOSE-CAPILLARY: 333 mg/dL — AB (ref 65–99)
GLUCOSE-CAPILLARY: 391 mg/dL — AB (ref 65–99)
GLUCOSE-CAPILLARY: 584 mg/dL — AB (ref 65–99)
Glucose-Capillary: 491 mg/dL — ABNORMAL HIGH (ref 65–99)
Glucose-Capillary: 301 mg/dL — ABNORMAL HIGH (ref 65–99)
Glucose-Capillary: 324 mg/dL — ABNORMAL HIGH (ref 65–99)
Glucose-Capillary: 373 mg/dL — ABNORMAL HIGH (ref 65–99)

## 2017-02-10 LAB — BASIC METABOLIC PANEL
ANION GAP: 13 (ref 5–15)
BUN: 29 mg/dL — ABNORMAL HIGH (ref 6–20)
CO2: 26 mmol/L (ref 22–32)
Calcium: 9.4 mg/dL (ref 8.9–10.3)
Chloride: 97 mmol/L — ABNORMAL LOW (ref 101–111)
Creatinine, Ser: 1.41 mg/dL — ABNORMAL HIGH (ref 0.44–1.00)
GFR, EST AFRICAN AMERICAN: 43 mL/min — AB (ref >60)
GFR, EST NON AFRICAN AMERICAN: 37 mL/min — AB (ref >60)
Glucose, Bld: 483 mg/dL — ABNORMAL HIGH (ref 65–99)
POTASSIUM: 4.8 mmol/L (ref 3.5–5.1)
SODIUM: 136 mmol/L (ref 135–145)

## 2017-02-10 LAB — CREATININE, SERUM
Creatinine, Ser: 1.47 mg/dL — ABNORMAL HIGH (ref 0.44–1.00)
GFR, EST AFRICAN AMERICAN: 41 mL/min — AB (ref >60)
GFR, EST NON AFRICAN AMERICAN: 35 mL/min — AB (ref >60)

## 2017-02-10 LAB — LACTIC ACID, PLASMA: Lactic Acid, Venous: 2.2 mmol/L (ref 0.5–1.9)

## 2017-02-10 LAB — TSH: TSH: 2.059 u[IU]/mL (ref 0.350–4.500)

## 2017-02-10 LAB — MAGNESIUM: Magnesium: 2 mg/dL (ref 1.7–2.4)

## 2017-02-10 MED ORDER — IPRATROPIUM-ALBUTEROL 0.5-2.5 (3) MG/3ML IN SOLN: 3.0000 mL | RESPIRATORY_TRACT | Status: DC | PRN

## 2017-02-10 MED ORDER — INSULIN ASPART 100 UNIT/ML ~~LOC~~ SOLN
9.0000 [IU] | Freq: Once | SUBCUTANEOUS | Status: AC
  Administered 2017-02-10: 9 [IU] via SUBCUTANEOUS

## 2017-02-10 MED ORDER — INSULIN ASPART 100 UNIT/ML ~~LOC~~ SOLN
15.0000 [IU] | Freq: Once | SUBCUTANEOUS | Status: AC
  Administered 2017-02-10: 15 [IU] via SUBCUTANEOUS

## 2017-02-10 MED ORDER — INSULIN GLARGINE 100 UNIT/ML ~~LOC~~ SOLN
10.0000 [IU] | Freq: Every day | SUBCUTANEOUS | Status: DC
  Administered 2017-02-10 – 2017-02-12 (×3): 10 [IU] via SUBCUTANEOUS
  Filled 2017-02-10 (×4): qty 0.1

## 2017-02-10 MED ORDER — IPRATROPIUM-ALBUTEROL 0.5-2.5 (3) MG/3ML IN SOLN
3.0000 mL | Freq: Two times a day (BID) | RESPIRATORY_TRACT | Status: DC
  Administered 2017-02-10 – 2017-02-13 (×6): 3 mL via RESPIRATORY_TRACT
  Filled 2017-02-10 (×6): qty 3

## 2017-02-10 MED ORDER — INSULIN GLARGINE 100 UNIT/ML ~~LOC~~ SOLN
5.0000 [IU] | Freq: Once | SUBCUTANEOUS | Status: AC
  Administered 2017-02-10: 5 [IU] via SUBCUTANEOUS
  Filled 2017-02-10: qty 0.05

## 2017-02-10 NOTE — Progress Notes (Signed)
NURSING PROGRESS NOTE  Amy Shepard [id number] Admission Data: 02/10/2017 5:20 AM Attending Provider: Rise Patience, MD TFT:[id number], Arbie Cookey, MD Code Status: Full   Amy Shepard is a 71 y.o. female patient admitted from ED:  -No acute distress noted.  -No complaints of shortness of breath.  -No complaints of chest pain.   Cardiac Monitoring: Box # 40in place. Cardiac monitor yields:normal sinus rhythm.  Blood pressure 115/65, pulse 82, temperature 98.2 F (36.8 C), temperature source Oral, resp. rate 18, height _0  (1.549 m), weight 91.9 kg (202 lb 8 oz), SpO2 95 %.   IV Fluids:  IV in place, occlusive dsg intact without redness, IV cath forearm right, condition patent and no redness none.   Allergies:  Amoxicillin  Past Medical History:   has a past medical history of Arthritis; Cancer [facility]); CHF (congestive heart failure) ([place]) (02/2016); COPD (chronic obstructive pulmonary disease) ([place]); Depression; Diabetes mellitus; seasonal allergies; Hypercholesteremia; Hypertension; Lung cancer ([place]); Pneumonia; and Shortness of breath dyspnea.  Past Surgical History:   has a past surgical history that includes Thoracotomy (Right, 2010); left heart catheterization with coronary angiogram (N/A, 07/12/2014); Tonsillectomy; Cardiac catheterization; Eye surgery; and Maximum access (mas)posterior lumbar interbody fusion (plif) 2 level (N/A, 02/22/2016).  Social History:   reports that she quit smoking about 10 years ago. She has a 30.00 pack-year smoking history. She has never used smokeless tobacco. She reports that she does not drink alcohol or use drugs.  Skin: Skin intact with scattered scratches from dry skin.   Patient/Family orientated to room. Information packet given to patient/family. Admission inpatient armband information verified with patient/family to include name and date of birth and placed on patient arm. Side rails up x 2, fall assessment and education completed  with patient/family. Patient/family able to verbalize understanding of risk associated with falls and verbalized understanding to call for assistance before getting out of bed. Call light within reach. Patient/family able to voice and demonstrate understanding of unit orientation instructions.    Will continue to evaluate and treat per MD orders.

## 2017-02-10 NOTE — Progress Notes (Signed)
PROGRESS NOTE  Amy Shepard HKV:[id number] DOB: 03/29/1946 DOA: 02/09/2017 PCP: Maurice Small, MD  HPI/Recap of past 42 hours: 71 year old female with past medical history of COPD and CAD plus chronic diastolic heart failure and diabetes mellitus poorly controlled admitted on the night of 7/30 for several weeks of progressively worsening shortness of breath.  Patient still states that actually her breathing has not been good now for over a month and a half. Increased dyspnea is with exertion. Nonproductive cough. When she came into the emergency room, a shunt found to have a BNP of almost 1000 with normal troponin. Chest x-ray noted pulmonary edema. Patient requiring additional oxygen from her baseline 2 L nasal cannula. Given IV Lasix and started diuresing.  This morning, patient started to feel somewhat better. Still has some cough and mild dyspnea on exertion although not as bad. No other complaints. Of note, patient also found to have a lactic acid level 2.34 although no signs of sepsis noted. She also reports difficult compliance issues with some of her diabetes medications secondary to cost.  Assessment/Plan: Principal Problem:   Acute on chronic respiratory failure with hypoxia ([name]) secondary to acute on chronic diastolic heart failure: Rechecking echocardiogram. IV Lasix. Following daily weights. Baseline 2 L nasal cannula, continue diuresing and fully diuresed Active Problems:   Coronary artery disease involving native coronary artery: Stable   COPD (chronic obstructive pulmonary disease) ([place]): Ongoing stable. Felt this more related to CHF. Nebulizers and oxygen.   Diabetes mellitus type 2 , uncontrolled with complications in obese [facility]): Patient meets criteria for obesity given BMI greater than 30. Started Lantus and following sliding scale. Checking A1c. Given need to discontinue metformin (see below) plus cheaper medications for her to be able to afford, will likely end up being on  Lantus.    HTN (hypertension)    Lactic acidosis: No evidence of infection. Patient is not volume depleted. Most likely from metformin. Will discontinue.   Code Status: Full code   Family Communication:  Left message with daughter  Disposition Plan: Potential DC in next few days once fully diuresed    Consultants:  None   Procedures:  Echo pending   Antimicrobials:  Urine culture pending   DVT prophylaxis:  Lovenox   Objective: Vitals:   02/10/17 0554 02/10/17 0819 02/10/17 0822 02/10/17 1227  BP: (!) 137/107   (!) 111/52  Pulse: 87   88  Resp: 18   18  Temp: 97.7 F (36.5 C)   98 F (36.7 C)  TempSrc: Oral   Oral  SpO2: 100% 92% 92% 92%  Weight:      Height:        Intake/Output Summary (Last 24 hours) at 02/10/17 1310 Last data filed at 02/10/17 1102  Gross per 24 hour  Intake                0 ml  Output             2600 ml  Net            -2600 ml   Filed Weights   02/09/17 2300 02/10/17 0016 02/10/17 0500  Weight: 77.6 kg (171 lb) 91.9 kg (202 lb 8 oz) 91.6 kg (202 lb)    Exam:   General:  Alert and oriented 3   HEENT: Normocephalic nature, mucous membranes are moist  Neck: Supple, no carotid bruits  Cardiovascular: Regular rate and rhythm, S1 and S2, 2/6 systolic ejection murmur   Respiratory: Decreased  breath sounds throughout, more so bibasilar, breathing is not labored at rest  Abdomen: Soft, nontender, nondistended, positive bowel sounds   Musculoskeletal: No clubbing or cyanosis, 1+ pitting edema from the knees down   Skin: No skin breaks, tears or lesions  Psychiatry: Patient is appropriate, no evidence of psychoses  Neuro: No focal deficits    Data Reviewed: CBC:  Recent Labs Lab 02/09/17 1518 02/10/17 0005 02/10/17 0236  WBC 6.5 6.5 6.9  NEUTROABS  --   --  6.3  HGB 13.8 13.7 14.1  HCT 44.0 43.6 44.8  MCV 88.7 88.6 88.5  PLT 222 234 030   Basic Metabolic Panel:  Recent Labs Lab 02/09/17 1518  02/10/17 0005 02/10/17 0236  NA 139  --  136  K 4.3  --  4.8  CL 97*  --  97*  CO2 30  --  26  GLUCOSE 374*  --  483*  BUN 28*  --  29*  CREATININE 1.39* 1.47* 1.41*  CALCIUM 9.3  --  9.4  MG  --   --  2.0   GFR: Estimated Creatinine Clearance: 38.3 mL/min (A) (by C-G formula based on SCr of 1.41 mg/dL (H)). Liver Function Tests: No results for input(s): AST, ALT, ALKPHOS, BILITOT, PROT, ALBUMIN in the last 168 hours. No results for input(s): LIPASE, AMYLASE in the last 168 hours. No results for input(s): AMMONIA in the last 168 hours. Coagulation Profile:  Recent Labs Lab 02/09/17 1630  INR 1.00   Cardiac Enzymes: No results for input(s): CKTOTAL, CKMB, CKMBINDEX, TROPONINI in the last 168 hours. BNP (last 3 results)  Recent Labs  07/21/16 1210 07/29/16 1159 09/05/16 1131  PROBNP 2,696* 3,611* 2,929*   HbA1C: No results for input(s): HGBA1C in the last 72 hours. CBG:  Recent Labs Lab 02/10/17 0005 02/10/17 0213 02/10/17 0548 02/10/17 0756 02/10/17 1123  GLUCAP 584* 491* 391* 333* 301*   Lipid Profile: No results for input(s): CHOL, HDL, LDLCALC, TRIG, CHOLHDL, LDLDIRECT in the last 72 hours. Thyroid Function Tests:  Recent Labs  02/10/17 0236  TSH 2.059   Anemia Panel: No results for input(s): VITAMINB12, FOLATE, FERRITIN, TIBC, IRON, RETICCTPCT in the last 72 hours. Urine analysis:    Component Value Date/Time   COLORURINE YELLOW 02/10/2017 0707   APPEARANCEUR HAZY (A) 02/10/2017 0707   LABSPEC 1.015 02/10/2017 0707   PHURINE 6.0 02/10/2017 0707   GLUCOSEU >=500 (A) 02/10/2017 0707   HGBUR SMALL (A) 02/10/2017 0707   BILIRUBINUR NEGATIVE 02/10/2017 0707   KETONESUR NEGATIVE 02/10/2017 0707   PROTEINUR 100 (A) 02/10/2017 0707   UROBILINOGEN 0.2 02/10/2013 1041   NITRITE POSITIVE (A) 02/10/2017 0707   LEUKOCYTESUR SMALL (A) 02/10/2017 0707   Sepsis Labs: Lactic Acid, Venous    Component Value Date/Time   LATICACIDVEN 2.34 ([place])  02/09/2017 1907      Studies: Dg Chest 2 View  Result Date: 02/09/2017 CLINICAL DATA:  Shortness of breath and chest pain. EXAM: CHEST  2 VIEW COMPARISON:  Chest x-ray dated September 18, 2016. FINDINGS: Unchanged cardiomegaly. Atherosclerotic calcification of the aortic arch. Postsurgical changes in the right lower lobe are unchanged. Mild increased pulmonary venous congestion. Increased interstitial prominence, likely representing edema. No focal consolidation, pleural effusion, or pneumothorax. No acute osseous abnormality. Osteopenia. Partially visualized lumbar spinal hardware. IMPRESSION: 1. Cardiomegaly with mild pulmonary edema. 2.  Aortic Atherosclerosis (ICD10-I70.0). Electronically Signed   By: Titus Dubin M.D.   On: 02/09/2017 15:55    Scheduled Meds: . aspirin EC  81 mg Oral QPM  . budesonide (PULMICORT) nebulizer solution  0.25 mg Nebulization BID  . enoxaparin (LOVENOX) injection  40 mg Subcutaneous Q24H  . furosemide  60 mg Intravenous Q12H  . gabapentin  300 mg Oral TID  . glipiZIDE  10 mg Oral Q breakfast  . insulin aspart  0-9 Units Subcutaneous TID WC  . insulin glargine  10 Units Subcutaneous QHS  . ipratropium-albuterol  3 mL Nebulization BID  . potassium chloride  20 mEq Oral Daily  . sertraline  150 mg Oral Daily  . spironolactone  25 mg Oral Daily    Continuous Infusions:   LOS: 1 day     Annita Brod, MD [facility] Pager [phone]  If 7PM-7AM, please contact night-coverage [email or web address] Password [email or web address] 02/10/2017, 1:10 PM

## 2017-02-10 NOTE — Clinical Social Work Note (Signed)
CSW acknowledges consult "Patient have difficulty paying for her medications since losing her job." Will notify RNCM.  CSW signing off. Consult again if any social work needs arise.  Dayton Scrape, [place]

## 2017-02-10 NOTE — Progress Notes (Signed)
Notified MD blood sugar 373, lactic acid 2.2 and 4+ edema in BLLE

## 2017-02-10 NOTE — Progress Notes (Signed)
Notified Amy Shepard that patient's blood sugar was 584. An order for 15 units of Insulin was given. Will continue to monitor for patient safety.

## 2017-02-10 NOTE — Progress Notes (Signed)
Inpatient Diabetes Program Recommendations  AACE/ADA: New Consensus Statement on Inpatient Glycemic Control (2015)  Target Ranges:  Prepandial:   less than 140 mg/dL      Peak postprandial:   less than 180 mg/dL (1-2 hours)      Critically ill patients:  140 - 180 mg/dL  Results for CHRISTL, FESSENDEN (MRN [id number]) as of 02/10/2017 10:54  Ref. Range 02/10/2017 00:05 02/10/2017 02:13 02/10/2017 05:48 02/10/2017 07:56  Glucose-Capillary Latest Ref Range: 65 - 99 mg/dL 584 (HH) 491 (H) 391 (H) 333 (H)   Results for MIRKA, BARBONE (MRN [id number]) as of 02/10/2017 10:54  Ref. Range 05/15/2016 14:02  Hemoglobin A1C Latest Ref Range: 4.8 - 5.6 % 11.6 (H)   Review of Glycemic Control  Diabetes history: DM2 Outpatient Diabetes medications: Glipizide XL 10 mg QAM Current orders for Inpatient glycemic control:Novolog 0-9 units TID with meals, Glipizide XL 10 mg QAM  Inpatient Diabetes Program Recommendations: Correction (SSI): Please consider ordering Novolog 0-5 units QHS for bedtime correction scale. HgbA1C: Please add on an A1C  to blood in lab (if none available, draw with next scheduled blood draw) to evaluate glycemic control over the past 2-3 months.  NOTE: Noted patient received Solumedrol 125 mg on 02/09/17 at 16:26 and no other steroids ordered. Noted patient received one time dose of Lantus 5 units today and Glipizide was restarted.  Thanks, Barnie Alderman, RN, MSN, CDE Diabetes Coordinator Inpatient Diabetes Program [phone] (Team Pager from 8am to 5pm)

## 2017-02-11 DIAGNOSIS — J9601 Acute respiratory failure with hypoxia: Secondary | ICD-10-CM

## 2017-02-11 DIAGNOSIS — J42 Unspecified chronic bronchitis: Secondary | ICD-10-CM

## 2017-02-11 DIAGNOSIS — I509 Heart failure, unspecified: Secondary | ICD-10-CM

## 2017-02-11 LAB — GLUCOSE, CAPILLARY
GLUCOSE-CAPILLARY: 117 mg/dL — AB (ref 65–99)
GLUCOSE-CAPILLARY: 278 mg/dL — AB (ref 65–99)
GLUCOSE-CAPILLARY: 139 mg/dL — AB (ref 65–99)
GLUCOSE-CAPILLARY: 253 mg/dL — AB (ref 65–99)

## 2017-02-11 LAB — BASIC METABOLIC PANEL
Anion gap: 8 (ref 5–15)
BUN: 29 mg/dL — AB (ref 6–20)
CHLORIDE: 93 mmol/L — AB (ref 101–111)
CO2: 37 mmol/L — ABNORMAL HIGH (ref 22–32)
Calcium: 9.4 mg/dL (ref 8.9–10.3)
Creatinine, Ser: 1.25 mg/dL — ABNORMAL HIGH (ref 0.44–1.00)
GFR calc non Af Amer: 43 mL/min — ABNORMAL LOW (ref >60)
GFR, EST AFRICAN AMERICAN: 49 mL/min — AB (ref >60)
Glucose, Bld: 110 mg/dL — ABNORMAL HIGH (ref 65–99)
POTASSIUM: 4.6 mmol/L (ref 3.5–5.1)
SODIUM: 138 mmol/L (ref 135–145)

## 2017-02-11 LAB — HEMOGLOBIN A1C
HEMOGLOBIN A1C: 12.9 % — AB (ref 4.8–5.6)
MEAN PLASMA GLUCOSE: 324 mg/dL

## 2017-02-11 LAB — LACTIC ACID, PLASMA: Lactic Acid, Venous: 1.7 mmol/L (ref 0.5–1.9)

## 2017-02-11 MED ORDER — GLIPIZIDE ER 10 MG PO TB24
10.0000 mg | ORAL_TABLET | Freq: Every day | ORAL | Status: DC
  Administered 2017-02-12 – 2017-02-13 (×2): 10 mg via ORAL
  Filled 2017-02-11 (×2): qty 1

## 2017-02-11 MED ORDER — SULFAMETHOXAZOLE-TRIMETHOPRIM 400-80 MG PO TABS
1.0000 | ORAL_TABLET | Freq: Two times a day (BID) | ORAL | Status: DC
  Administered 2017-02-11 – 2017-02-13 (×5): 1 via ORAL
  Filled 2017-02-11 (×5): qty 1

## 2017-02-11 MED ORDER — DEXTROSE 5 % IV SOLN: 1.0000 g | INTRAVENOUS | Status: DC

## 2017-02-11 NOTE — Progress Notes (Signed)
PROGRESS NOTE    Amy Shepard  RCB:[id number] DOB: 1945-08-05 DOA: 02/09/2017 PCP: Maurice Small, MD    Brief Narrative:  71 year old female with past medical history of COPD and CAD plus chronic diastolic heart failure and diabetes mellitus poorly controlled admitted on the night of 7/30 for several weeks of progressively worsening shortness of breath.she was found to have pulmonary edema. She was admitted for acute respiratory failure with hypoxia sec to acute on chronic diastolic heart failure.     Assessment & Plan:   Principal Problem:   Acute respiratory failure with hypoxia ([name]) Active Problems:   Coronary artery disease involving native coronary artery   COPD (chronic obstructive pulmonary disease) ([name])   Diabetes mellitus type 2 in obese ([name])   Acute on chronic diastolic CHF (congestive heart failure) ([name])   HTN (hypertension)   CHF (congestive heart failure) ([name])   Lactic acidosis   Acute on chronic respiratory failure secondary to acute on chronic diastolic heart failure:  diuresis with IV lasix, net fluid balance of neg 4.6 lit since admission.  Requiring 3 lit of [place] Oxygen. Still very dyspneic and fluid overloaded.  Will need another 24 to 48 hours of IV diuresis.  Repeat echocardiogram ordered.    CAD; pt denies chest pain or sob.  Resume home meds.    COPD:  No wheezing heard,. Resume home meds.    Diabetes Mellitus:  CBG (last 3)   Recent Labs  02/10/17 2114 02/11/17 0730 02/11/17 1101  GLUCAP 373* 117* 278*  post prandial cbg's elevated.  Will add novolog pre meal coverage 3 units TIDAC.  Resume SSI and lantus.  A1c is 12.9     UTI:  Urine cultures growing e coli. Rocephin added.    Acute renal failure:  ? Fluid overload, ATN. Get urine electrolytes. Check FeNa D/c spironolactone and monitor renal parameters on lasix.   Lactic acidosis:  ? Respiratory acidosis vs metformin induced  rpeat lactic acid in am.   DVT  prophylaxis: (Lovenox) Code Status: (Full Family Communication: none at bedside.  Disposition Plan: pending PT evaluation..need 1 to 2 days for diuresis.    Consultants:   None.    Procedures: ECHOCARDIOGRAM.    Antimicrobials:  Rocephin for UTI.   Subjective: Sleepy, denies any nausea, vomiting. Chest pain or sob.    Objective: Vitals:   02/11/17 0006 02/11/17 0453 02/11/17 0853 02/11/17 1258  BP: (!) 119/97 117/61  113/87  Pulse: 82 77  67  Resp: _0 Temp: 97.6 F (36.4 C) 97.6 F (36.4 C)  (!) 97.3 F (36.3 C)  TempSrc: Oral Oral  Oral  SpO2: 94% 99% 93% 96%  Weight:  89.1 kg (196 lb 8 oz)    Height:        Intake/Output Summary (Last 24 hours) at 02/11/17 1413 Last data filed at 02/11/17 0851  Gross per 24 hour  Intake              460 ml  Output             2400 ml  Net            -1940 ml   Filed Weights   02/10/17 0016 02/10/17 0500 02/11/17 0453  Weight: 91.9 kg (202 lb 8 oz) 91.6 kg (202 lb) 89.1 kg (196 lb 8 oz)    Examination:  General exam: Appears calm and comfortable, sleepy. Respiratory system: Clear to auscultation. Respiratory effort normal. Cardiovascular system: S1 &  S2 heard, RRR. No JVD, murmurs, rubs, gallops or clicks. Gastrointestinal system: Abdomen is nondistended, soft and nontender. No organomegaly or masses felt. Normal bowel sounds heard. Central nervous system: Alert and oriented. No focal neurological deficits. Extremities: Symmetric 5 x 5 power. 2+ edema.  Skin: No rashes, lesions or ulcers Psychiatry: Judgement and insight appear normal. Mood & affect appropriate.     Data Reviewed: I have personally reviewed following labs and imaging studies  CBC:  Recent Labs Lab 02/09/17 1518 02/10/17 0005 02/10/17 0236  WBC 6.5 6.5 6.9  NEUTROABS  --   --  6.3  HGB 13.8 13.7 14.1  HCT 44.0 43.6 44.8  MCV 88.7 88.6 88.5  PLT 222 234 237   Basic Metabolic Panel:  Recent Labs Lab 02/09/17 1518 02/10/17 0005  02/10/17 0236 02/11/17 0216  NA 139  --  136 138  K 4.3  --  4.8 4.6  CL 97*  --  97* 93*  CO2 30  --  26 37*  GLUCOSE 374*  --  483* 110*  BUN 28*  --  29* 29*  CREATININE 1.39* 1.47* 1.41* 1.25*  CALCIUM 9.3  --  9.4 9.4  MG  --   --  2.0  --    GFR: Estimated Creatinine Clearance: 42.5 mL/min (A) (by C-G formula based on SCr of 1.25 mg/dL (H)). Liver Function Tests: No results for input(s): AST, ALT, ALKPHOS, BILITOT, PROT, ALBUMIN in the last 168 hours. No results for input(s): LIPASE, AMYLASE in the last 168 hours. No results for input(s): AMMONIA in the last 168 hours. Coagulation Profile:  Recent Labs Lab 02/09/17 1630  INR 1.00   Cardiac Enzymes: No results for input(s): CKTOTAL, CKMB, CKMBINDEX, TROPONINI in the last 168 hours. BNP (last 3 results)  Recent Labs  07/21/16 1210 07/29/16 1159 09/05/16 1131  PROBNP 2,696* 3,611* 2,929*   HbA1C:  Recent Labs  02/10/17 1330  HGBA1C 12.9*   CBG:  Recent Labs Lab 02/10/17 1123 02/10/17 1641 02/10/17 2114 02/11/17 0730 02/11/17 1101  GLUCAP 301* 324* 373* 117* 278*   Lipid Profile: No results for input(s): CHOL, HDL, LDLCALC, TRIG, CHOLHDL, LDLDIRECT in the last 72 hours. Thyroid Function Tests:  Recent Labs  02/10/17 0236  TSH 2.059   Anemia Panel: No results for input(s): VITAMINB12, FOLATE, FERRITIN, TIBC, IRON, RETICCTPCT in the last 72 hours. Sepsis Labs:  Recent Labs Lab 02/09/17 1533 02/09/17 1907 02/10/17 1828  LATICACIDVEN 1.72 2.34* 2.2*    Recent Results (from the past 240 hour(s))  Urine culture     Status: Abnormal (Preliminary result)   Collection Time: 02/10/17  7:07 AM  Result Value Ref Range Status   Specimen Description URINE, RANDOM  Final   Special Requests NONE  Final   Culture >=100,000 COLONIES/mL ESCHERICHIA COLI (A)  Final   Report Status PENDING  Incomplete         Radiology Studies: Dg Chest 2 View  Result Date: 02/09/2017 CLINICAL DATA:  Shortness  of breath and chest pain. EXAM: CHEST  2 VIEW COMPARISON:  Chest x-ray dated September 18, 2016. FINDINGS: Unchanged cardiomegaly. Atherosclerotic calcification of the aortic arch. Postsurgical changes in the right lower lobe are unchanged. Mild increased pulmonary venous congestion. Increased interstitial prominence, likely representing edema. No focal consolidation, pleural effusion, or pneumothorax. No acute osseous abnormality. Osteopenia. Partially visualized lumbar spinal hardware. IMPRESSION: 1. Cardiomegaly with mild pulmonary edema. 2.  Aortic Atherosclerosis (ICD10-I70.0). Electronically Signed   By: Orville Govern.D.  On: 02/09/2017 15:55        Scheduled Meds: . aspirin EC  81 mg Oral QPM  . budesonide (PULMICORT) nebulizer solution  0.25 mg Nebulization BID  . enoxaparin (LOVENOX) injection  40 mg Subcutaneous Q24H  . furosemide  60 mg Intravenous Q12H  . gabapentin  300 mg Oral TID  . [START ON 02/12/2017] glipiZIDE  10 mg Oral Q breakfast  . insulin aspart  0-9 Units Subcutaneous TID WC  . insulin glargine  10 Units Subcutaneous QHS  . ipratropium-albuterol  3 mL Nebulization BID  . potassium chloride  20 mEq Oral Daily  . sertraline  150 mg Oral Daily  . spironolactone  25 mg Oral Daily   Continuous Infusions:   LOS: 2 days    Time spent: 35 minutes.     Hosie Poisson, MD [facility] Pager [phone]  If 7PM-7AM, please contact night-coverage [email or web address] Password [email or web address] 02/11/2017, 2:13 PM

## 2017-02-11 NOTE — Progress Notes (Addendum)
Inpatient Diabetes Program Recommendations  AACE/ADA: New Consensus Statement on Inpatient Glycemic Control (2015)  Target Ranges:  Prepandial:   less than 140 mg/dL      Peak postprandial:   less than 180 mg/dL (1-2 hours)      Critically ill patients:  140 - 180 mg/dL   Results for Amy Shepard, Amy Shepard (MRN [id number]) as of 02/11/2017 11:18  Ref. Range 02/11/2017 07:30 02/11/2017 11:01  Glucose-Capillary Latest Ref Range: 65 - 99 mg/dL 117 (H) 278 (H)   Review of Glycemic Control  Diabetes history: DM 2 Outpatient Diabetes medications: Janumet 50-1000 mg BID, Glipizide 10 mg Daily, Victoza Daily Current orders for Inpatient glycemic control: Lantus 10 units, Novolog Sensitive tid, Glipizide 10 mg Daily  Inpatient Diabetes Program Recommendations:   Fasting glucose within goal 117 mg/dl this am, however glucose spiked after meal without coverage. Patient may benefit from meal coverage Novolog 4 units tid if patient consumes at least 50% of meals.  A1c 12.9% on 02/10/17, uncontrolled at home, Elevated A1c in the system x1 year, needs close follow up with PCP for DM medication adjustments, will see patient today.  Thanks,  Tama Headings RN, MSN, [facility] Inpatient Diabetes Coordinator Team Pager [phone] (8a-5p)

## 2017-02-11 NOTE — Progress Notes (Signed)
Inpatient Diabetes Program Recommendations  AACE/ADA: New Consensus Statement on Inpatient Glycemic Control (2015)  Target Ranges:  Prepandial:   less than 140 mg/dL      Peak postprandial:   less than 180 mg/dL (1-2 hours)      Critically ill patients:  140 - 180 mg/dL   Saw and spoke with patient about DM control at home. Patient lethargic and kept dosing off to sleep during discussion. Discussed A1c level, 12.9%.  Discussed glucose and A1c goals. Discussed importance of checking CBGs and maintaining good CBG control to prevent long-term and short-term complications. Explained how hyperglycemia leads to damage within blood vessels which lead to the common complications seen with uncontrolled diabetes. Stressed to the patient the importance of improving glycemic control to prevent further complications from uncontrolled diabetes. Patient reports taking her glucose levels at home and they range within the 200's most of the time. Patient reports her PCP Maurice Small manages her DM and prescribes her medications. Patient reports being out of some of her medications for 3 months. Last time pt saw PCP was 3 months ago. Patient reports being in the donut hole. Patient reports not being able to afford her Victoza injection. Spoke with patient about contacting her MD about medications she is not able to afford to see if there are cheaper options. Will nee follow up and possible CM consult when patient is more alert.  Thanks,  Tama Headings RN, MSN, [facility] Inpatient Diabetes Coordinator Team Pager [phone] (8a-5p)

## 2017-02-12 DIAGNOSIS — E1169 Type 2 diabetes mellitus with other specified complication: Secondary | ICD-10-CM

## 2017-02-12 DIAGNOSIS — J441 Chronic obstructive pulmonary disease with (acute) exacerbation: Secondary | ICD-10-CM

## 2017-02-12 DIAGNOSIS — I5033 Acute on chronic diastolic (congestive) heart failure: Secondary | ICD-10-CM

## 2017-02-12 DIAGNOSIS — I10 Essential (primary) hypertension: Secondary | ICD-10-CM

## 2017-02-12 DIAGNOSIS — E669 Obesity, unspecified: Secondary | ICD-10-CM

## 2017-02-12 LAB — GLUCOSE, CAPILLARY
GLUCOSE-CAPILLARY: 205 mg/dL — AB (ref 65–99)
GLUCOSE-CAPILLARY: 162 mg/dL — AB (ref 65–99)
GLUCOSE-CAPILLARY: 278 mg/dL — AB (ref 65–99)
GLUCOSE-CAPILLARY: 278 mg/dL — AB (ref 65–99)

## 2017-02-12 LAB — URINE CULTURE: Culture: >=100000 — AB

## 2017-02-12 LAB — BASIC METABOLIC PANEL
Anion gap: 9 (ref 5–15)
BUN: 31 mg/dL — AB (ref 6–20)
CHLORIDE: 92 mmol/L — AB (ref 101–111)
CO2: 37 mmol/L — AB (ref 22–32)
CREATININE: 1.4 mg/dL — AB (ref 0.44–1.00)
Calcium: 9.4 mg/dL (ref 8.9–10.3)
GFR calc Af Amer: 43 mL/min — ABNORMAL LOW (ref >60)
GFR calc non Af Amer: 37 mL/min — ABNORMAL LOW (ref >60)
GLUCOSE: 312 mg/dL — AB (ref 65–99)
POTASSIUM: 4.9 mmol/L (ref 3.5–5.1)
SODIUM: 138 mmol/L (ref 135–145)

## 2017-02-12 MED ORDER — SULFAMETHOXAZOLE-TRIMETHOPRIM 400-80 MG PO TABS: 1.0000 | ORAL_TABLET | Freq: Two times a day (BID) | ORAL | 0 refills | Status: DC

## 2017-02-12 MED ORDER — METHYLPREDNISOLONE SODIUM SUCC 125 MG IJ SOLR
60.0000 mg | Freq: Two times a day (BID) | INTRAMUSCULAR | Status: DC
  Administered 2017-02-12 – 2017-02-13 (×3): 60 mg via INTRAVENOUS
  Filled 2017-02-12 (×3): qty 2

## 2017-02-12 MED ORDER — INSULIN GLARGINE 100 UNIT/ML ~~LOC~~ SOLN: 10.0000 [IU] | Freq: Every day | SUBCUTANEOUS | 2 refills | Status: DC

## 2017-02-12 MED ORDER — SPIRONOLACTONE 25 MG PO TABS: 25.0000 mg | ORAL_TABLET | Freq: Every day | ORAL | 0 refills | Status: DC

## 2017-02-12 MED ORDER — INSULIN ASPART 100 UNIT/ML ~~LOC~~ SOLN: SUBCUTANEOUS | 11 refills | Status: DC

## 2017-02-12 MED ORDER — PREDNISONE 10 MG PO TABS: ORAL_TABLET | ORAL | 0 refills | Status: DC

## 2017-02-12 MED ORDER — POTASSIUM CHLORIDE ER 10 MEQ PO TBCR: 10.0000 meq | EXTENDED_RELEASE_TABLET | Freq: Every day | ORAL | 0 refills | Status: DC

## 2017-02-12 MED ORDER — INSULIN ASPART 100 UNIT/ML ~~LOC~~ SOLN
3.0000 [IU] | Freq: Three times a day (TID) | SUBCUTANEOUS | Status: DC
  Administered 2017-02-12 – 2017-02-13 (×4): 3 [IU] via SUBCUTANEOUS

## 2017-02-12 NOTE — Progress Notes (Addendum)
PROGRESS NOTE    Amy Shepard  QMG:[id number] DOB: 11/25/1945 DOA: 02/09/2017 PCP: Maurice Small, MD    Brief Narrative:  71 year old female with past medical history of COPD and CAD plus chronic diastolic heart failure and diabetes mellitus poorly controlled admitted on the night of 7/30 for several weeks of progressively worsening shortness of breath.she was found to have pulmonary edema. She was admitted for acute respiratory failure with hypoxia sec to acute on chronic diastolic heart failure.     Assessment & Plan:   Principal Problem:   Acute respiratory failure with hypoxia ([name]) Active Problems:   Coronary artery disease involving native coronary artery   COPD (chronic obstructive pulmonary disease) ([name])   Diabetes mellitus type 2 in obese ([name])   Acute on chronic diastolic CHF (congestive heart failure) ([name])   HTN (hypertension)   CHF (congestive heart failure) ([name])   Lactic acidosis   Acute on chronic respiratory failure secondary to acute on chronic diastolic heart failure:  diuresis with IV lasix, net fluid balance of neg 6.3 lit since admission.  Requiring 3 lit of [place] Oxygen. Improving, she is back down to 2 lit of  oxygen.  Her creatinine is starting to get worse, so we held the lasix tonight, repeat creatinine in am and if improving restart lasix .     CAD; pt denies chest pain or sob.  Resume home meds.    COPD:  Scattered wheezing.  Started her on IV solumedrol.    Diabetes Mellitus:  CBG (last 3)   Recent Labs  02/12/17 0732 02/12/17 1124 02/12/17 1647  GLUCAP 205* 162* 278*    Elevated cbg's secondary to IV steroids.  Resume SSI and lantus.  A1c is 12.9  Pt reprots she cannot afford lantus or victoza or novolog 70/30.  She just want to be discharged on oral metformin and glipizide.     UTI:  Urine cultures growing e coli. Rocephin added changed to oral bactrim to complete the course.     Acute renal failure:  ? Fluid  overload, ATN. Get urine electrolytes. Check FeNa D/c spironolactone and monitor renal parameters off lasix.   Lactic acidosis:  ? Respiratory acidosis vs metformin induced  rpeat lactic acid in am is normal.    DVT prophylaxis: (Lovenox) Code Status: (Full Family Communication: none at bedside.  Disposition Plan: home with home health in am. .    Consultants:   None.    Procedures: ECHOCARDIOGRAM.    Antimicrobials:  Rocephin for UTI.   Subjective: Alert and wants to go home in am.  No chest pain.  Still very dyspneic.    Objective: Vitals:   02/12/17 0756 02/12/17 0831 02/12/17 0832 02/12/17 1126  BP: 124/65   132/68  Pulse: 80   80  Resp: 18   18  Temp: 98 F (36.7 C)   97.7 F (36.5 C)  TempSrc: Oral   Oral  SpO2: 93% 92% 92% 93%  Weight:      Height:        Intake/Output Summary (Last 24 hours) at 02/12/17 1727 Last data filed at 02/12/17 1649  Gross per 24 hour  Intake              240 ml  Output             1400 ml  Net            -1160 ml   Filed Weights   02/10/17 0500 02/11/17 0453 02/12/17 0408  Weight: 91.6 kg (202 lb) 89.1 kg (196 lb 8 oz) 86 kg (189 lb 9.6 oz)    Examination:  General exam: Appears calm and comfortable, on  2 lit [place] oxygen.  Respiratory system: bilateral wheezing posteriorly. No rhonchi.  Cardiovascular system: S1 & S2 heard, RRR. No JVD, murmurs, rubs, gallops or clicks. Gastrointestinal system: Abdomen is  Soft NT nd bs+ Central nervous system: Alert and oriented. No focal neurological deficits. Extremities: Symmetric 5 x 5 power improving edema.  Skin: No rashes, lesions or ulcers Psychiatry: Judgement and insight appear normal. Mood & affect appropriate.     Data Reviewed: I have personally reviewed following labs and imaging studies  CBC:  Recent Labs Lab 02/09/17 1518 02/10/17 0005 02/10/17 0236  WBC 6.5 6.5 6.9  NEUTROABS  --   --  6.3  HGB 13.8 13.7 14.1  HCT 44.0 43.6 44.8  MCV 88.7 88.6 88.5    PLT 222 234 952   Basic Metabolic Panel:  Recent Labs Lab 02/09/17 1518 02/10/17 0005 02/10/17 0236 02/11/17 0216 02/12/17 0307  NA 139  --  136 138 138  K 4.3  --  4.8 4.6 4.9  CL 97*  --  97* 93* 92*  CO2 30  --  26 37* 37*  GLUCOSE 374*  --  483* 110* 312*  BUN 28*  --  29* 29* 31*  CREATININE 1.39* 1.47* 1.41* 1.25* 1.40*  CALCIUM 9.3  --  9.4 9.4 9.4  MG  --   --  2.0  --   --    GFR: Estimated Creatinine Clearance: 37.2 mL/min (A) (by C-G formula based on SCr of 1.4 mg/dL (H)). Liver Function Tests: No results for input(s): AST, ALT, ALKPHOS, BILITOT, PROT, ALBUMIN in the last 168 hours. No results for input(s): LIPASE, AMYLASE in the last 168 hours. No results for input(s): AMMONIA in the last 168 hours. Coagulation Profile:  Recent Labs Lab 02/09/17 1630  INR 1.00   Cardiac Enzymes: No results for input(s): CKTOTAL, CKMB, CKMBINDEX, TROPONINI in the last 168 hours. BNP (last 3 results)  Recent Labs  07/21/16 1210 07/29/16 1159 09/05/16 1131  PROBNP 2,696* 3,611* 2,929*   HbA1C:  Recent Labs  02/10/17 1330  HGBA1C 12.9*   CBG:  Recent Labs Lab 02/11/17 1635 02/11/17 2109 02/12/17 0732 02/12/17 1124 02/12/17 1647  GLUCAP 139* 253* 205* 162* 278*   Lipid Profile: No results for input(s): CHOL, HDL, LDLCALC, TRIG, CHOLHDL, LDLDIRECT in the last 72 hours. Thyroid Function Tests:  Recent Labs  02/10/17 0236  TSH 2.059   Anemia Panel: No results for input(s): VITAMINB12, FOLATE, FERRITIN, TIBC, IRON, RETICCTPCT in the last 72 hours. Sepsis Labs:  Recent Labs Lab 02/09/17 1533 02/09/17 1907 02/10/17 1828 02/11/17 1445  LATICACIDVEN 1.72 2.34* 2.2* 1.7    Recent Results (from the past 240 hour(s))  Urine culture     Status: Abnormal   Collection Time: 02/10/17  7:07 AM  Result Value Ref Range Status   Specimen Description URINE, RANDOM  Final   Special Requests NONE  Final   Culture >=100,000 COLONIES/mL ESCHERICHIA COLI (A)   Final   Report Status 02/12/2017 FINAL  Final   Organism ID, Bacteria ESCHERICHIA COLI (A)  Final      Susceptibility   Escherichia coli - MIC*    AMPICILLIN >=32 RESISTANT Resistant     CEFAZOLIN 16 SENSITIVE Sensitive     CEFTRIAXONE <=1 SENSITIVE Sensitive     CIPROFLOXACIN 0.5 SENSITIVE Sensitive  GENTAMICIN <=1 SENSITIVE Sensitive     IMIPENEM <=0.25 SENSITIVE Sensitive     NITROFURANTOIN <=16 SENSITIVE Sensitive     TRIMETH/SULFA <=20 SENSITIVE Sensitive     AMPICILLIN/SULBACTAM >=32 RESISTANT Resistant     PIP/TAZO 16 SENSITIVE Sensitive     Extended ESBL NEGATIVE Sensitive     * >=100,000 COLONIES/mL ESCHERICHIA COLI         Radiology Studies: No results found.      Scheduled Meds: . aspirin EC  81 mg Oral QPM  . budesonide (PULMICORT) nebulizer solution  0.25 mg Nebulization BID  . enoxaparin (LOVENOX) injection  40 mg Subcutaneous Q24H  . gabapentin  300 mg Oral TID  . glipiZIDE  10 mg Oral Q breakfast  . insulin aspart  0-9 Units Subcutaneous TID WC  . insulin aspart  3 Units Subcutaneous TID WC  . insulin glargine  10 Units Subcutaneous QHS  . ipratropium-albuterol  3 mL Nebulization BID  . methylPREDNISolone (SOLU-MEDROL) injection  60 mg Intravenous Q12H  . potassium chloride  20 mEq Oral Daily  . sertraline  150 mg Oral Daily  . sulfamethoxazole-trimethoprim  1 tablet Oral Q12H   Continuous Infusions:   LOS: 3 days    Time spent: 35 minutes.     Hosie Poisson, MD [facility] Pager [phone]  If 7PM-7AM, please contact night-coverage [email or web address] Password [email or web address] 02/12/2017, 5:27 PM

## 2017-02-12 NOTE — Progress Notes (Addendum)
Inpatient Diabetes Program Recommendations  AACE/ADA: New Consensus Statement on Inpatient Glycemic Control (2015)  Target Ranges:  Prepandial:   less than 140 mg/dL      Peak postprandial:   less than 180 mg/dL (1-2 hours)      Critically ill patients:  140 - 180 mg/dL   Results for SHEQUILA, NEGLIA (MRN [id number]) as of 02/12/2017 10:25  Ref. Range 02/11/2017 07:30 02/11/2017 11:01 02/11/2017 16:35 02/11/2017 21:09 02/12/2017 07:32  Glucose-Capillary Latest Ref Range: 65 - 99 mg/dL 117 (H) 278 (H) 139 (H) 253 (H) 205 (H)  Results for ZAMAYA, RAPAPORT (MRN [id number]) as of 02/12/2017 10:25  Ref. Range 02/10/2017 13:30  Hemoglobin A1C Latest Ref Range: 4.8 - 5.6 % 12.9 (H)   Review of Glycemic Control  Diabetes history: DM2 Outpatient Diabetes medications: Glipizide XL 10 mg QAM, Victoza 1.8 mg daily, Janumet 50-1000 mg BID Current orders for Inpatient glycemic control: Lantus 10 units QHS, Novolog 0-9 units TID with meals, Novolog 3 units TID with meals for meal coverage, Glipizide XL 10 mg QAM  Inpatient Diabetes Program Recommendations: Insulin - Basal: Please consider increasing Lantus to 12 units QHS. Correction (SSI): Please consider ordering Novolog 0-5 units QHS for bedtime correction scale. HgbA1C: A1C 12.9% on 02/10/17 indicating an average glucose of 324 mg/dl over the past 2-3 months. Outpatient DM medications: May need to consider changing outpatient DM medications due to issue with cost of medications.  Patient reported being in the donut hole and not being able to afford her Victoza or Janumet. Patient does not want to be discharged on insulin but would like DM medications to be adjusted to more affordable DM medications. May want to consider increasing Glipizide XL to 10 mg BID and ordering Metformin 1000 mg BID (if renal function sufficient).  Addendum 02/12/17_0 :69- Spoke with patient about diabetes and home regimen for diabetes control. Patient reports that she does not recall  talking with Tobey Grim, RN, Diabetes Coordinator on 02/11/17. Patient states that she has been taking Glipizide XL 10 mg daily but she has not been able to take the Victoza or Janumet due to the cost of the medications with her being in the 'donut hole'.  Patient states that when she was able to take the Victoza, Glipizide XL, and the Janumet her glucose ranged from 90-150 mg/dl. It has been 2-3 months since she took the [place] or Janumet.  Discussed A1C results (12.9% on 02/10/17) and explained that her current A1C indicates an average glucose of 324 mg/dl over the past 2-3 months. Discussed glucose and A1C goals. Discussed importance of checking CBGs and maintaining good CBG control to prevent long-term and short-term complications. Explained how hyperglycemia leads to damage within blood vessels which lead to the common complications seen with uncontrolled diabetes. Stressed to the patient the importance of improving glycemic control to prevent further complications from uncontrolled diabetes. Discussed impact of nutrition, exercise, stress, sickness, and medications (steroids) on diabetes control. Explained to patient that she is receiving steroids at this time which is contributing to hyperglycemia. Discussed current insulin regimen. Inquired about willingness to use insulin as an outpatient and patient states that she does not want to take insulin as an outpatient. Explained that she will need more than Glipizide as currently taken to keep DM control. Patient states that she prefers to have Glipizide increased if possible and to add other affordable DM medications if possible. Combination medication Janumet (Januvia and Metformin) is an expensive medication (approximately $450 per month). If renal function  sufficient, perhaps Metformin XR 1000 mg could be prescribed which is a very affordable medication. However, Januvia is also expensive (approximately $450 per month). Encouraged patient to talk with her PCP  about difficulty with affording medications and to be sure to take medications as prescribed at time of discharge and follow up with PCP.  Patient verbalized understanding of information discussed and she states that she has no further questions at this time related to diabetes.  Thanks, Barnie Alderman, RN, MSN, CDE Diabetes Coordinator Inpatient Diabetes Program [phone] (Team Pager)

## 2017-02-13 LAB — BASIC METABOLIC PANEL
Anion gap: 13 (ref 5–15)
BUN: 32 mg/dL — ABNORMAL HIGH (ref 6–20)
CALCIUM: 9.4 mg/dL (ref 8.9–10.3)
CO2: 31 mmol/L (ref 22–32)
CREATININE: 1.52 mg/dL — AB (ref 0.44–1.00)
Chloride: 91 mmol/L — ABNORMAL LOW (ref 101–111)
GFR, EST AFRICAN AMERICAN: 39 mL/min — AB (ref >60)
GFR, EST NON AFRICAN AMERICAN: 34 mL/min — AB (ref >60)
Glucose, Bld: 337 mg/dL — ABNORMAL HIGH (ref 65–99)
Potassium: 4.7 mmol/L (ref 3.5–5.1)
Sodium: 135 mmol/L (ref 135–145)

## 2017-02-13 LAB — GLUCOSE, CAPILLARY
GLUCOSE-CAPILLARY: 268 mg/dL — AB (ref 65–99)
GLUCOSE-CAPILLARY: 221 mg/dL — AB (ref 65–99)

## 2017-02-13 NOTE — Progress Notes (Signed)
Discharge to home, d/d instructions and follow up appointments discussed with patient, verbalized understanding. PIV removed no s/s of infiltration or swelling noted.

## 2017-02-13 NOTE — Progress Notes (Signed)
Inpatient Diabetes Program Recommendations  AACE/ADA: New Consensus Statement on Inpatient Glycemic Control (2015)  Target Ranges:  Prepandial:   less than 140 mg/dL      Peak postprandial:   less than 180 mg/dL (1-2 hours)      Critically ill patients:  140 - 180 mg/dL   Results for QUEENA, MONRREAL (MRN [id number]) as of 02/13/2017 08:59  Ref. Range 02/12/2017 07:32 02/12/2017 11:24 02/12/2017 16:47 02/12/2017 21:01 02/13/2017 07:40  Glucose-Capillary Latest Ref Range: 65 - 99 mg/dL 205 (H) 162 (H) 278 (H) 278 (H) 268 (H)   Review of Glycemic Control  Current orders for Inpatient glycemic control: Lantus 10 units QHS, Novolog 0-9 units TID with meals, Novolog 3 units TID with meals, Glipizide XL 10 mg QAM  Inpatient Diabetes Program Recommendations:  Insulin - Basal: If steroids are continued as ordered, please consider increasing Lantus to 12 units QHS. Correction (SSI): Please consider ordering Novolog 0-5 units QHS for bedtime correction scale. Insulin - Meal Coverage: If steroids are continued as ordered, please consider increasing meal coverage to Novolog 6 units TID with meals.  Thanks, Barnie Alderman, RN, MSN, CDE Diabetes Coordinator Inpatient Diabetes Program [phone] (Team Pager from 8am to 5pm)

## 2017-02-13 NOTE — Care Management Important Message (Signed)
Important Message  Patient Details  Name: Amy Shepard MRN: [id number] Date of Birth: 1945/11/03   Medicare Important Message Given:  Yes    Malik Paar [address] 02/13/2017, 12:49 PM

## 2017-02-16 NOTE — Discharge Summary (Signed)
Physician Discharge Summary  Amy Shepard KPT:[id number] DOB: 08/05/1945 DOA: 02/09/2017  PCP: Maurice Small, MD  Admit date: 02/09/2017 Discharge date: 02/13/2017  Admitted From: Home.  Disposition:  Home.   Recommendations for Outpatient Follow-up:  1. Follow up with PCP in 1-2 weeks 2. Please obtain BMP/CBC in one week Please follow up  With PCP regarding your diabetic medications.     Discharge Condition:stable.  CODE STATUS:full code.  Diet recommendation: Heart Healthy / Carb Modified  Brief/Interim Summary: 71 year old female with past medical history of COPD and CAD plus chronic diastolic heart failure and diabetes mellitus poorly controlled admitted on the night of 7/30 forseveral weeks of progressively worsening shortness of breath.she was found to have pulmonary edema. She was admitted for acute respiratory failure with hypoxia sec to acute on chronic diastolic heart failure.   Discharge Diagnoses:  Principal Problem:   Acute respiratory failure with hypoxia ([name]) Active Problems:   Coronary artery disease involving native coronary artery   COPD (chronic obstructive pulmonary disease) ([name])   Diabetes mellitus type 2 in obese ([name])   Acute on chronic diastolic CHF (congestive heart failure) ([name])   HTN (hypertension)   CHF (congestive heart failure) ([name])   Lactic acidosis   Acute on chronic respiratory failure secondary to acute on chronic diastolic heart failure:  diuresis with IV lasix, net fluid balance of neg 6.3 lit since admission.  Requiring 3 lit of [place] Oxygen. Improving, she is back down to 2 lit of [place] oxygen.  Breathing has improved and she is discharged to follow up with cardiology as outpatient.     CAD; pt denies chest pain or sob.  Resume home meds.    COPD:  Scattered wheezing.  Started her on IV solumedrol transitioned to prednisone taper on discharge.    Diabetes Mellitus:  CBG (last 3)   Recent Labs (last 2 labs)     Recent Labs  02/12/17 0732 02/12/17 1124 02/12/17 1647  GLUCAP 205* 162* 278*      Elevated cbg's secondary to IV steroids.  Resume SSI and lantus.  A1c is 12.9  Pt reprots she cannot afford lantus or victoza or novolog 70/30.  She just want to be discharged on oral metformin and glipizide. Unfortunately she cannot be on metformin due to her renal function. She was discharged on glipizide and recommended to follow up with her PCP right away to see if she can get medication assistance.     UTI:  Urine cultures growing e coli. Rocephin added changed to oral bactrim to complete the course.     Acute renal failure:  ? Fluid overload, ATN. Slight improvement.  Recommended to check renal parameters in one week.   Lactic acidosis:  ? Respiratory acidosis vs metformin induced  rpeat lactic acid in am is normal.      Discharge Instructions  Discharge Instructions    Diet - low sodium heart healthy    Complete by:  As directed    Discharge instructions    Complete by:  As directed    Please follow up with PCP in one week.  Recommend starting lantus or levemir once your medicare part B starts as your hemoglobin A1c is greater than 12.     Allergies as of 02/13/2017      Reactions   Amoxicillin Anaphylaxis, Hives, Rash   Has patient had a PCN reaction causing immediate rash, facial/tongue/throat swelling, SOB or lightheadedness with hypotension: YES Positive reaction causing SEVERE RASH INVOLVING MUCUS MEMBRANES/SKIN NECROSIS:  YES Reaction that required HOSPITALIZATION: YES Reaction occurring within the last 10 years: NO      Medication List    STOP taking these medications   celecoxib 100 MG capsule Commonly known as:  CELEBREX   naproxen sodium 220 MG tablet Commonly known as:  ANAPROX   sitaGLIPtin-metformin 50-1000 MG tablet Commonly known as:  JANUMET     TAKE these medications   aspirin EC 81 MG tablet Take 81 mg by mouth every evening.    CLEAR EYES FOR DRY EYES OP Place 1 drop into both eyes 2 (two) times daily.   Fluticasone-Salmeterol 250-50 MCG/DOSE Aepb Commonly known as:  ADVAIR Inhale 1 puff into the lungs 2 (two) times daily.   furosemide 40 MG tablet Commonly known as:  LASIX Take 1 tablet (40 mg total) by mouth 2 (two) times daily. Take one dose at 8 am and then take the 2nd dose at 2 pm every day   gabapentin 300 MG capsule Commonly known as:  NEURONTIN Take 300 mg by mouth 3 (three) times daily.   GLIPIZIDE XL 5 MG 24 hr tablet Generic drug:  glipiZIDE Take 10 mg by mouth every morning.   ipratropium-albuterol 0.5-2.5 (3) MG/3ML Soln Commonly known as:  DUONEB Take 3 mLs by nebulization every 4 (four) hours as needed (wheezing, Shortness of breath).   potassium chloride 10 MEQ tablet Commonly known as:  K-DUR Take 1 tablet (10 mEq total) by mouth daily. Take extra tablet on Wednesdays and Saturdays (when taking Zaroxolyn) What changed:  how much to take   predniSONE 10 MG tablet Commonly known as:  DELTASONE Prednisone 60 mg daily for 2 days followed by  Prednisone 40 mg daily for 3 days followed by  Prednisone 20 mg daily for 3 days.   sertraline 100 MG tablet Commonly known as:  ZOLOFT Take 150 mg by mouth daily.   spironolactone 25 MG tablet Commonly known as:  ALDACTONE Take 1 tablet (25 mg total) by mouth daily.   sulfamethoxazole-trimethoprim 400-80 MG tablet Commonly known as:  BACTRIM,SEPTRA Take 1 tablet by mouth every 12 (twelve) hours.   VICTOZA 18 MG/3ML Sopn Generic drug:  liraglutide Inject 1.8 mg into the skin daily.      Follow-up Information    Maurice Small, MD. Schedule an appointment as soon as possible for a visit on 02/16/2017.   Specialty:  Family Medicine Why:  follow up appt _0 :30am Contact information: [address] [place] [address] [phone]          Allergies  Allergen Reactions  . Amoxicillin Anaphylaxis, Hives and Rash     Has patient had a PCN reaction causing immediate rash, facial/tongue/throat swelling, SOB or lightheadedness with hypotension: YES Positive reaction causing SEVERE RASH INVOLVING MUCUS MEMBRANES/SKIN NECROSIS: YES Reaction that required HOSPITALIZATION: YES Reaction occurring within the last 10 years: NO    Consultations:  None.    Procedures/Studies: Dg Chest 2 View  Result Date: 02/09/2017 CLINICAL DATA:  Shortness of breath and chest pain. EXAM: CHEST  2 VIEW COMPARISON:  Chest x-ray dated September 18, 2016. FINDINGS: Unchanged cardiomegaly. Atherosclerotic calcification of the aortic arch. Postsurgical changes in the right lower lobe are unchanged. Mild increased pulmonary venous congestion. Increased interstitial prominence, likely representing edema. No focal consolidation, pleural effusion, or pneumothorax. No acute osseous abnormality. Osteopenia. Partially visualized lumbar spinal hardware. IMPRESSION: 1. Cardiomegaly with mild pulmonary edema. 2.  Aortic Atherosclerosis (ICD10-I70.0). Electronically Signed   By: Orville Govern.D.  On: 02/09/2017 15:55       Subjective: No new complaints.   Discharge Exam: Vitals:   02/12/17 1951 02/13/17 0609  BP: 139/87 134/70  Pulse: 93 85  Resp: 18 18  Temp: 98.7 F (37.1 C) 97.7 F (36.5 C)   Vitals:   02/12/17 1945 02/12/17 1951 02/13/17 0609 02/13/17 0941  BP:  139/87 134/70   Pulse:  93 85   Resp:  18 18   Temp:  98.7 F (37.1 C) 97.7 F (36.5 C)   TempSrc:  Oral Oral   SpO2: 92% 94% 96% 95%  Weight:   86.1 kg (189 lb 14.4 oz)   Height:        General: Pt is alert, awake, not in acute distress Cardiovascular: RRR, S1/S2 +, no rubs, no gallops Respiratory: CTA bilaterally, no wheezing, no rhonchi Abdominal: Soft, NT, ND, bowel sounds + Extremities: no edema, no cyanosis    The results of significant diagnostics from this hospitalization (including imaging, microbiology, ancillary and laboratory) are listed  below for reference.     Microbiology: Recent Results (from the past 240 hour(s))  Urine culture     Status: Abnormal   Collection Time: 02/10/17  7:07 AM  Result Value Ref Range Status   Specimen Description URINE, RANDOM  Final   Special Requests NONE  Final   Culture >=100,000 COLONIES/mL ESCHERICHIA COLI (A)  Final   Report Status 02/12/2017 FINAL  Final   Organism ID, Bacteria ESCHERICHIA COLI (A)  Final      Susceptibility   Escherichia coli - MIC*    AMPICILLIN >=32 RESISTANT Resistant     CEFAZOLIN 16 SENSITIVE Sensitive     CEFTRIAXONE <=1 SENSITIVE Sensitive     CIPROFLOXACIN 0.5 SENSITIVE Sensitive     GENTAMICIN <=1 SENSITIVE Sensitive     IMIPENEM <=0.25 SENSITIVE Sensitive     NITROFURANTOIN <=16 SENSITIVE Sensitive     TRIMETH/SULFA <=20 SENSITIVE Sensitive     AMPICILLIN/SULBACTAM >=32 RESISTANT Resistant     PIP/TAZO 16 SENSITIVE Sensitive     Extended ESBL NEGATIVE Sensitive     * >=100,000 COLONIES/mL ESCHERICHIA COLI     Labs: BNP (last 3 results)  Recent Labs  03/28/16 1504 06/23/16 1702 02/09/17 1518  BNP 93.2 435.9* 998.3*   Basic Metabolic Panel:  Recent Labs Lab 02/09/17 1518 02/10/17 0005 02/10/17 0236 02/11/17 0216 02/12/17 0307 02/13/17 0419  NA 139  --  136 138 138 135  K 4.3  --  4.8 4.6 4.9 4.7  CL 97*  --  97* 93* 92* 91*  CO2 30  --  26 37* 37* 31  GLUCOSE 374*  --  483* 110* 312* 337*  BUN 28*  --  29* 29* 31* 32*  CREATININE 1.39* 1.47* 1.41* 1.25* 1.40* 1.52*  CALCIUM 9.3  --  9.4 9.4 9.4 9.4  MG  --   --  2.0  --   --   --    Liver Function Tests: No results for input(s): AST, ALT, ALKPHOS, BILITOT, PROT, ALBUMIN in the last 168 hours. No results for input(s): LIPASE, AMYLASE in the last 168 hours. No results for input(s): AMMONIA in the last 168 hours. CBC:  Recent Labs Lab 02/09/17 1518 02/10/17 0005 02/10/17 0236  WBC 6.5 6.5 6.9  NEUTROABS  --   --  6.3  HGB 13.8 13.7 14.1  HCT 44.0 43.6 44.8  MCV 88.7  88.6 88.5  PLT 222 234 234   Cardiac Enzymes: No results  for input(s): CKTOTAL, CKMB, CKMBINDEX, TROPONINI in the last 168 hours. BNP: Invalid input(s): POCBNP CBG:  Recent Labs Lab 02/12/17 1124 02/12/17 1647 02/12/17 2101 02/13/17 0740 02/13/17 1117  GLUCAP 162* 278* 278* 268* 221*   D-Dimer No results for input(s): DDIMER in the last 72 hours. Hgb A1c No results for input(s): HGBA1C in the last 72 hours. Lipid Profile No results for input(s): CHOL, HDL, LDLCALC, TRIG, CHOLHDL, LDLDIRECT in the last 72 hours. Thyroid function studies No results for input(s): TSH, T4TOTAL, T3FREE, THYROIDAB in the last 72 hours.  Invalid input(s): FREET3 Anemia work up No results for input(s): VITAMINB12, FOLATE, FERRITIN, TIBC, IRON, RETICCTPCT in the last 72 hours. Urinalysis    Component Value Date/Time   COLORURINE YELLOW 02/10/2017 0707   APPEARANCEUR HAZY (A) 02/10/2017 0707   LABSPEC 1.015 02/10/2017 0707   PHURINE 6.0 02/10/2017 0707   GLUCOSEU >=500 (A) 02/10/2017 0707   HGBUR SMALL (A) 02/10/2017 0707   BILIRUBINUR NEGATIVE 02/10/2017 0707   KETONESUR NEGATIVE 02/10/2017 0707   PROTEINUR 100 (A) 02/10/2017 0707   UROBILINOGEN 0.2 02/10/2013 1041   NITRITE POSITIVE (A) 02/10/2017 0707   LEUKOCYTESUR SMALL (A) 02/10/2017 0707   Sepsis Labs Invalid input(s): PROCALCITONIN,  WBC,  LACTICIDVEN Microbiology Recent Results (from the past 240 hour(s))  Urine culture     Status: Abnormal   Collection Time: 02/10/17  7:07 AM  Result Value Ref Range Status   Specimen Description URINE, RANDOM  Final   Special Requests NONE  Final   Culture >=100,000 COLONIES/mL ESCHERICHIA COLI (A)  Final   Report Status 02/12/2017 FINAL  Final   Organism ID, Bacteria ESCHERICHIA COLI (A)  Final      Susceptibility   Escherichia coli - MIC*    AMPICILLIN >=32 RESISTANT Resistant     CEFAZOLIN 16 SENSITIVE Sensitive     CEFTRIAXONE <=1 SENSITIVE Sensitive     CIPROFLOXACIN 0.5 SENSITIVE  Sensitive     GENTAMICIN <=1 SENSITIVE Sensitive     IMIPENEM <=0.25 SENSITIVE Sensitive     NITROFURANTOIN <=16 SENSITIVE Sensitive     TRIMETH/SULFA <=20 SENSITIVE Sensitive     AMPICILLIN/SULBACTAM >=32 RESISTANT Resistant     PIP/TAZO 16 SENSITIVE Sensitive     Extended ESBL NEGATIVE Sensitive     * >=100,000 COLONIES/mL ESCHERICHIA COLI     Time coordinating discharge: Over 30 minutes  SIGNED:   Hosie Poisson, MD  [facility] 02/16/2017, 8:32 AM Pager   If 7PM-7AM, please contact night-coverage [email or web address] Password [email or web address]

## 2017-02-18 DIAGNOSIS — E782 Mixed hyperlipidemia: Secondary | ICD-10-CM | POA: Diagnosis not present

## 2017-02-18 DIAGNOSIS — F325 Major depressive disorder, single episode, in full remission: Secondary | ICD-10-CM | POA: Diagnosis not present

## 2017-02-18 DIAGNOSIS — Z5181 Encounter for therapeutic drug level monitoring: Secondary | ICD-10-CM | POA: Diagnosis not present

## 2017-02-18 DIAGNOSIS — J441 Chronic obstructive pulmonary disease with (acute) exacerbation: Secondary | ICD-10-CM | POA: Diagnosis not present

## 2017-02-18 DIAGNOSIS — I5033 Acute on chronic diastolic (congestive) heart failure: Secondary | ICD-10-CM | POA: Diagnosis not present

## 2017-02-18 DIAGNOSIS — E1122 Type 2 diabetes mellitus with diabetic chronic kidney disease: Secondary | ICD-10-CM | POA: Diagnosis not present

## 2017-02-18 DIAGNOSIS — Z794 Long term (current) use of insulin: Secondary | ICD-10-CM | POA: Diagnosis not present

## 2017-03-10 ENCOUNTER — Inpatient Hospital Stay (HOSPITAL_COMMUNITY)
Admission: EM | Admit: 2017-03-10 | Discharge: 2017-03-15 | DRG: 291 | Disposition: A | Discharge status: Home / Self Care | Payer: Medicare Other | Attending: Nephrology | Admitting: Nephrology

## 2017-03-10 ENCOUNTER — Emergency Department (HOSPITAL_COMMUNITY): Payer: Medicare Other

## 2017-03-10 ENCOUNTER — Encounter (HOSPITAL_COMMUNITY): Payer: Self-pay | Admitting: Emergency Medicine

## 2017-03-10 DIAGNOSIS — I313 Pericardial effusion (noninflammatory): Secondary | ICD-10-CM | POA: Diagnosis present

## 2017-03-10 DIAGNOSIS — E785 Hyperlipidemia, unspecified: Secondary | ICD-10-CM | POA: Diagnosis present

## 2017-03-10 DIAGNOSIS — I10 Essential (primary) hypertension: Secondary | ICD-10-CM | POA: Diagnosis not present

## 2017-03-10 DIAGNOSIS — J441 Chronic obstructive pulmonary disease with (acute) exacerbation: Secondary | ICD-10-CM | POA: Diagnosis present

## 2017-03-10 DIAGNOSIS — E1142 Type 2 diabetes mellitus with diabetic polyneuropathy: Secondary | ICD-10-CM | POA: Diagnosis present

## 2017-03-10 DIAGNOSIS — Z87891 Personal history of nicotine dependence: Secondary | ICD-10-CM

## 2017-03-10 DIAGNOSIS — R21 Rash and other nonspecific skin eruption: Secondary | ICD-10-CM | POA: Diagnosis present

## 2017-03-10 DIAGNOSIS — Z79890 Hormone replacement therapy (postmenopausal): Secondary | ICD-10-CM

## 2017-03-10 DIAGNOSIS — I130 Hypertensive heart and chronic kidney disease with heart failure and stage 1 through stage 4 chronic kidney disease, or unspecified chronic kidney disease: Secondary | ICD-10-CM | POA: Diagnosis not present

## 2017-03-10 DIAGNOSIS — N183 Chronic kidney disease, stage 3 unspecified: Secondary | ICD-10-CM

## 2017-03-10 DIAGNOSIS — Z85118 Personal history of other malignant neoplasm of bronchus and lung: Secondary | ICD-10-CM

## 2017-03-10 DIAGNOSIS — J449 Chronic obstructive pulmonary disease, unspecified: Secondary | ICD-10-CM | POA: Diagnosis not present

## 2017-03-10 DIAGNOSIS — Z9119 Patient's noncompliance with other medical treatment and regimen: Secondary | ICD-10-CM

## 2017-03-10 DIAGNOSIS — Z881 Allergy status to other antibiotic agents status: Secondary | ICD-10-CM

## 2017-03-10 DIAGNOSIS — Z9842 Cataract extraction status, left eye: Secondary | ICD-10-CM

## 2017-03-10 DIAGNOSIS — J9621 Acute and chronic respiratory failure with hypoxia: Secondary | ICD-10-CM | POA: Diagnosis present

## 2017-03-10 DIAGNOSIS — J9620 Acute and chronic respiratory failure, unspecified whether with hypoxia or hypercapnia: Secondary | ICD-10-CM | POA: Diagnosis present

## 2017-03-10 DIAGNOSIS — Z7982 Long term (current) use of aspirin: Secondary | ICD-10-CM

## 2017-03-10 DIAGNOSIS — E1122 Type 2 diabetes mellitus with diabetic chronic kidney disease: Secondary | ICD-10-CM | POA: Diagnosis present

## 2017-03-10 DIAGNOSIS — F329 Major depressive disorder, single episode, unspecified: Secondary | ICD-10-CM | POA: Diagnosis present

## 2017-03-10 DIAGNOSIS — I2781 Cor pulmonale (chronic): Secondary | ICD-10-CM

## 2017-03-10 DIAGNOSIS — I2721 Secondary pulmonary arterial hypertension: Secondary | ICD-10-CM | POA: Diagnosis present

## 2017-03-10 DIAGNOSIS — I082 Rheumatic disorders of both aortic and tricuspid valves: Secondary | ICD-10-CM | POA: Diagnosis present

## 2017-03-10 DIAGNOSIS — I700 Atherosclerosis of aorta: Secondary | ICD-10-CM | POA: Diagnosis present

## 2017-03-10 DIAGNOSIS — I270 Primary pulmonary hypertension: Secondary | ICD-10-CM

## 2017-03-10 DIAGNOSIS — I5033 Acute on chronic diastolic (congestive) heart failure: Secondary | ICD-10-CM

## 2017-03-10 DIAGNOSIS — I2510 Atherosclerotic heart disease of native coronary artery without angina pectoris: Secondary | ICD-10-CM | POA: Diagnosis present

## 2017-03-10 DIAGNOSIS — E118 Type 2 diabetes mellitus with unspecified complications: Secondary | ICD-10-CM | POA: Diagnosis not present

## 2017-03-10 DIAGNOSIS — Z833 Family history of diabetes mellitus: Secondary | ICD-10-CM

## 2017-03-10 DIAGNOSIS — Z8249 Family history of ischemic heart disease and other diseases of the circulatory system: Secondary | ICD-10-CM

## 2017-03-10 DIAGNOSIS — G8929 Other chronic pain: Secondary | ICD-10-CM

## 2017-03-10 DIAGNOSIS — I509 Heart failure, unspecified: Secondary | ICD-10-CM

## 2017-03-10 DIAGNOSIS — Z9841 Cataract extraction status, right eye: Secondary | ICD-10-CM

## 2017-03-10 DIAGNOSIS — E7800 Pure hypercholesterolemia, unspecified: Secondary | ICD-10-CM | POA: Diagnosis present

## 2017-03-10 DIAGNOSIS — Z9981 Dependence on supplemental oxygen: Secondary | ICD-10-CM

## 2017-03-10 DIAGNOSIS — Z981 Arthrodesis status: Secondary | ICD-10-CM

## 2017-03-10 DIAGNOSIS — I5030 Unspecified diastolic (congestive) heart failure: Secondary | ICD-10-CM

## 2017-03-10 DIAGNOSIS — Z79899 Other long term (current) drug therapy: Secondary | ICD-10-CM

## 2017-03-10 DIAGNOSIS — E1165 Type 2 diabetes mellitus with hyperglycemia: Secondary | ICD-10-CM | POA: Diagnosis present

## 2017-03-10 DIAGNOSIS — I110 Hypertensive heart disease with heart failure: Secondary | ICD-10-CM | POA: Diagnosis not present

## 2017-03-10 DIAGNOSIS — E669 Obesity, unspecified: Secondary | ICD-10-CM | POA: Diagnosis not present

## 2017-03-10 DIAGNOSIS — E1169 Type 2 diabetes mellitus with other specified complication: Secondary | ICD-10-CM | POA: Diagnosis not present

## 2017-03-10 DIAGNOSIS — R7989 Other specified abnormal findings of blood chemistry: Secondary | ICD-10-CM | POA: Diagnosis present

## 2017-03-10 DIAGNOSIS — M7989 Other specified soft tissue disorders: Secondary | ICD-10-CM | POA: Diagnosis present

## 2017-03-10 DIAGNOSIS — I5023 Acute on chronic systolic (congestive) heart failure: Secondary | ICD-10-CM

## 2017-03-10 DIAGNOSIS — Z794 Long term (current) use of insulin: Secondary | ICD-10-CM | POA: Diagnosis not present

## 2017-03-10 DIAGNOSIS — Z6836 Body mass index (BMI) 36.0-36.9, adult: Secondary | ICD-10-CM

## 2017-03-10 DIAGNOSIS — Z7984 Long term (current) use of oral hypoglycemic drugs: Secondary | ICD-10-CM

## 2017-03-10 DIAGNOSIS — R011 Cardiac murmur, unspecified: Secondary | ICD-10-CM | POA: Diagnosis present

## 2017-03-10 HISTORY — DX: Atherosclerotic heart disease of native coronary artery without angina pectoris: I25.10

## 2017-03-10 HISTORY — DX: Dependence on supplemental oxygen: Z99.81

## 2017-03-10 LAB — BASIC METABOLIC PANEL
ANION GAP: 8 (ref 5–15)
BUN: 33 mg/dL — ABNORMAL HIGH (ref 6–20)
CALCIUM: 9.6 mg/dL (ref 8.9–10.3)
CO2: 30 mmol/L (ref 22–32)
Chloride: 96 mmol/L — ABNORMAL LOW (ref 101–111)
Creatinine, Ser: 1.47 mg/dL — ABNORMAL HIGH (ref 0.44–1.00)
GFR, EST AFRICAN AMERICAN: 41 mL/min — AB (ref >60)
GFR, EST NON AFRICAN AMERICAN: 35 mL/min — AB (ref >60)
GLUCOSE: 458 mg/dL — AB (ref 65–99)
POTASSIUM: 5.1 mmol/L (ref 3.5–5.1)
Sodium: 134 mmol/L — ABNORMAL LOW (ref 135–145)

## 2017-03-10 LAB — CBC
HEMATOCRIT: 46.4 % — AB (ref 36.0–46.0)
HEMOGLOBIN: 14.5 g/dL (ref 12.0–15.0)
MCH: 26.6 pg (ref 26.0–34.0)
MCHC: 31.3 g/dL (ref 30.0–36.0)
MCV: 85.1 fL (ref 78.0–100.0)
Platelets: 202 10*3/uL (ref 150–400)
RBC: 5.45 MIL/uL — AB (ref 3.87–5.11)
RDW: 16.2 % — ABNORMAL HIGH (ref 11.5–15.5)
WBC: 7.1 10*3/uL (ref 4.0–10.5)

## 2017-03-10 LAB — GLUCOSE, CAPILLARY: Glucose-Capillary: 474 mg/dL — ABNORMAL HIGH (ref 65–99)

## 2017-03-10 LAB — BRAIN NATRIURETIC PEPTIDE: B Natriuretic Peptide: 914.5 pg/mL — ABNORMAL HIGH (ref 0.0–100.0)

## 2017-03-10 LAB — PROCALCITONIN: Procalcitonin: <0.1 ng/mL

## 2017-03-10 LAB — I-STAT TROPONIN, ED: TROPONIN I, POC: 0.01 ng/mL (ref 0.00–0.08)

## 2017-03-10 MED ORDER — ACETAMINOPHEN 650 MG RE SUPP
650.0000 mg | Freq: Four times a day (QID) | RECTAL | Status: DC | PRN
Start: 2017-03-10 — End: 2017-03-15

## 2017-03-10 MED ORDER — GUAIFENESIN ER 600 MG PO TB12
1200.0000 mg | ORAL_TABLET | Freq: Two times a day (BID) | ORAL | Status: DC
  Administered 2017-03-10 – 2017-03-15 (×10): 1200 mg via ORAL
  Filled 2017-03-10 (×10): qty 2

## 2017-03-10 MED ORDER — SODIUM CHLORIDE 0.9% FLUSH
3.0000 mL | Freq: Two times a day (BID) | INTRAVENOUS | Status: DC
  Administered 2017-03-10 – 2017-03-15 (×10): 3 mL via INTRAVENOUS

## 2017-03-10 MED ORDER — HEPARIN SODIUM (PORCINE) 5000 UNIT/ML IJ SOLN
5000.0000 [IU] | Freq: Three times a day (TID) | INTRAMUSCULAR | Status: DC
  Administered 2017-03-12 – 2017-03-15 (×8): 5000 [IU] via SUBCUTANEOUS
  Filled 2017-03-10 (×9): qty 1

## 2017-03-10 MED ORDER — POTASSIUM CHLORIDE ER 10 MEQ PO TBCR
10.0000 meq | EXTENDED_RELEASE_TABLET | Freq: Every day | ORAL | Status: DC
  Filled 2017-03-10: qty 1

## 2017-03-10 MED ORDER — ACETAMINOPHEN 325 MG PO TABS
650.0000 mg | ORAL_TABLET | Freq: Four times a day (QID) | ORAL | Status: DC | PRN
Start: 2017-03-10 — End: 2017-03-15
  Administered 2017-03-10 – 2017-03-14 (×5): 650 mg via ORAL
  Filled 2017-03-10 (×6): qty 2

## 2017-03-10 MED ORDER — SODIUM CHLORIDE 0.9% FLUSH: 3.0000 mL | INTRAVENOUS | Status: DC | PRN

## 2017-03-10 MED ORDER — ONDANSETRON HCL 4 MG PO TABS
4.0000 mg | ORAL_TABLET | Freq: Four times a day (QID) | ORAL | Status: DC | PRN
Start: 2017-03-10 — End: 2017-03-15

## 2017-03-10 MED ORDER — ASPIRIN EC 81 MG PO TBEC
81.0000 mg | DELAYED_RELEASE_TABLET | Freq: Every evening | ORAL | Status: DC
  Administered 2017-03-11 – 2017-03-14 (×4): 81 mg via ORAL
  Filled 2017-03-10 (×4): qty 1

## 2017-03-10 MED ORDER — GABAPENTIN 300 MG PO CAPS
300.0000 mg | ORAL_CAPSULE | Freq: Three times a day (TID) | ORAL | Status: DC
  Administered 2017-03-10 – 2017-03-15 (×14): 300 mg via ORAL
  Filled 2017-03-10 (×14): qty 1

## 2017-03-10 MED ORDER — SODIUM CHLORIDE 0.9 % IV SOLN
250.0000 mL | INTRAVENOUS | Status: DC | PRN
Start: 2017-03-10 — End: 2017-03-15

## 2017-03-10 MED ORDER — FUROSEMIDE 10 MG/ML IJ SOLN
80.0000 mg | Freq: Once | INTRAMUSCULAR | Status: AC
  Administered 2017-03-10: 80 mg via INTRAVENOUS
  Filled 2017-03-10: qty 8

## 2017-03-10 MED ORDER — SPIRONOLACTONE 25 MG PO TABS
25.0000 mg | ORAL_TABLET | Freq: Every day | ORAL | Status: DC
  Administered 2017-03-10 – 2017-03-15 (×6): 25 mg via ORAL
  Filled 2017-03-10 (×6): qty 1

## 2017-03-10 MED ORDER — INSULIN ASPART 100 UNIT/ML ~~LOC~~ SOLN: 10.0000 [IU] | Freq: Once | SUBCUTANEOUS | Status: DC

## 2017-03-10 MED ORDER — INSULIN ASPART 100 UNIT/ML ~~LOC~~ SOLN
9.0000 [IU] | Freq: Once | SUBCUTANEOUS | Status: AC
  Administered 2017-03-10: 9 [IU] via SUBCUTANEOUS

## 2017-03-10 MED ORDER — MOMETASONE FURO-FORMOTEROL FUM 200-5 MCG/ACT IN AERO
2.0000 | INHALATION_SPRAY | Freq: Two times a day (BID) | RESPIRATORY_TRACT | Status: DC
  Administered 2017-03-11 – 2017-03-15 (×8): 2 via RESPIRATORY_TRACT
  Filled 2017-03-10: qty 8.8

## 2017-03-10 MED ORDER — DEXAMETHASONE SODIUM PHOSPHATE 10 MG/ML IJ SOLN: 10.0000 mg | Freq: Once | INTRAMUSCULAR | Status: DC

## 2017-03-10 MED ORDER — IPRATROPIUM-ALBUTEROL 0.5-2.5 (3) MG/3ML IN SOLN: 3.0000 mL | RESPIRATORY_TRACT | Status: DC | PRN

## 2017-03-10 MED ORDER — ONDANSETRON HCL 4 MG/2ML IJ SOLN: 4.0000 mg | Freq: Four times a day (QID) | INTRAMUSCULAR | Status: DC | PRN

## 2017-03-10 MED ORDER — INSULIN ASPART 100 UNIT/ML ~~LOC~~ SOLN
0.0000 [IU] | Freq: Three times a day (TID) | SUBCUTANEOUS | Status: DC
  Administered 2017-03-11: 3 [IU] via SUBCUTANEOUS
  Administered 2017-03-11: 9 [IU] via SUBCUTANEOUS

## 2017-03-10 MED ORDER — INSULIN ASPART 100 UNIT/ML ~~LOC~~ SOLN: 0.0000 [IU] | Freq: Every day | SUBCUTANEOUS | Status: DC

## 2017-03-10 MED ORDER — SERTRALINE HCL 50 MG PO TABS
150.0000 mg | ORAL_TABLET | Freq: Every day | ORAL | Status: DC
  Administered 2017-03-10 – 2017-03-15 (×6): 150 mg via ORAL
  Filled 2017-03-10 (×6): qty 1

## 2017-03-10 MED ORDER — FUROSEMIDE 10 MG/ML IJ SOLN
40.0000 mg | Freq: Two times a day (BID) | INTRAMUSCULAR | Status: DC
  Administered 2017-03-10 – 2017-03-13 (×6): 40 mg via INTRAVENOUS
  Filled 2017-03-10 (×6): qty 4

## 2017-03-10 NOTE — Progress Notes (Signed)
Patient anxious requesting PRN for anxiety. Paged MD on call.   Thomos Domine, RN

## 2017-03-10 NOTE — ED Notes (Signed)
Pt states she did not bring her O2 tank, but wears 3L [place] O2 at home. Sats 77-80% on room air.

## 2017-03-10 NOTE — Progress Notes (Signed)
Patient blood sugar 474. Paged MD Opy[facility] on call. Waiting on possible orders. Will continue to monitor.  Baudelio Karnes, RN

## 2017-03-10 NOTE — Progress Notes (Signed)
Pt educated about safety and importance of bed alarm during the night however pt refuses to be on bed alarm. Will continue to round on patient.   Meryl Ponder, RN    

## 2017-03-10 NOTE — H&P (Signed)
History and Physical    Rozella Servello GMW:[id number] DOB: 10-May-1946 DOA: 03/10/2017  PCP: Maurice Small, MD Patient coming from: Home  Chief Complaint: SOB  HPI: Amy Shepard is a 71 y.o. female with medical history significant of lung cancer, CHF, COPD, depression, diabetes, hyperlipidemia, hypertension. Patient presents with worsening shortness of breath and productive cough. Present for approximately 2 weeks. Symptoms coincide with patient's inability to afford and thus take her home medications. Per family patient has been unable to afford her diabetic medications though she is also had to forgo occasional doses of her Lasix. Patient endorses lower extremity swelling bilaterally with 10 pound weight gain. Patient denies fevers, chest pain, palpitations, abdominal pain, dysuria, frequency, neck stiffness, confusion, focal neurological deficit. Patient attempted using increasing doses of her O2 with only limited improvement  Patient also complaining of several-day history of diffuse itching. Patient has been scratching her skin throughout her legs, arms and upper back. Denies any contact with any allergic type plants or foods that would cause allergy.   ED Course: Objective findings outlined below. Lasix 80 mg IV given in ED.   Review of Systems: As per HPI otherwise all other systems reviewed and are negative  Ambulatory Status: Limited secondary to chronic O2 need.  Past Medical History:  Diagnosis Date  . Arthritis   . Cancer ([place])    lung ca  . CHF (congestive heart failure) ([place]) 02/2016  . COPD (chronic obstructive pulmonary disease) ([place])   . Depression   . Diabetes mellitus    [name]    dx  2008  . Hx of seasonal allergies   . Hypercholesteremia   . Hypertension    on no medications  . Lung cancer ([place])   . Pneumonia    2008  @  [name]  . Shortness of breath dyspnea    doe    Past Surgical History:  Procedure Laterality Date  . [facility] in [place]     bilateral cataracts  . LEFT HEART CATHETERIZATION WITH CORONARY ANGIOGRAM N/A 07/12/2014   Procedure: LEFT HEART CATHETERIZATION WITH CORONARY ANGIOGRAM;  Surgeon: [name], MD;  Location: [facility] CATH LAB;  Service: Cardiovascular;  Laterality: N/A;  . MAXIMUM ACCESS (MAS)POSTERIOR LUMBAR INTERBODY FUSION (PLIF) 2 LEVEL N/A 02/22/2016   Procedure: Lumbar three-four - Lumbar four-five  MAXIMUM ACCESS SURGERY  POSTERIOR LUMBAR INTERBODY FUSION;  Surgeon: [name], MD;  Location: [facility] NEURO ORS;  Service: Neurosurgery;  Laterality: N/A;  . THORACOTOMY Right 2010   lower  . TONSILLECTOMY      Social History   Social History  . Marital status: Divorced    Spouse name: N/A  . Number of children: N/A  . Years of education: N/A   Occupational History  . Not on file.   Social History Main Topics  . Smoking status: Former Smoker    Packs/day: 1.00    Years: 30.00    Quit date: 07/14/2006  . Smokeless tobacco: Never Used  . Alcohol use No  . Drug use: No  . Sexual activity: Not on file   Other Topics Concern  . Not on file   Social History Narrative  . No narrative on file    Allergies  Allergen Reactions  . Amoxicillin Anaphylaxis, Hives and Rash    Has patient had a PCN reaction causing immediate rash, facial/tongue/throat swelling, SOB or lightheadedness with hypotension:  YES Positive reaction causing SEVERE RASH INVOLVING MUCUS MEMBRANES/SKIN NECROSIS: YES Reaction that required HOSPITALIZATION: YES Reaction occurring within the last 10 years: NO    Family History  Problem Relation Age of Onset  . Diabetes Father   . Heart failure Father   . Cancer Sister   . Diabetes Brother   . Heart failure Brother       Prior to Admission medications   Medication Sig Start Date End Date Taking? Authorizing Provider  aspirin EC 81 MG tablet Take 81 mg by mouth every evening.    [provider]  Carboxymethylcellul-Glycerin (CLEAR EYES FOR DRY EYES OP) Place 1 drop into both eyes 2 (two) times daily.    [provider]  Fluticasone-Salmeterol (ADVAIR) 250-50 MCG/DOSE AEPB Inhale 1 puff into the lungs 2 (two) times daily.    [provider]  furosemide (LASIX) 40 MG tablet Take 1 tablet (40 mg total) by mouth 2 (two) times daily. Take one dose at 8 am and then take the 2nd dose at 2 pm every day 07/01/16   Dorothy Spark, MD  gabapentin (NEURONTIN) 300 MG capsule Take 300 mg by mouth 3 (three) times daily.    [provider]  GLIPIZIDE XL 5 MG 24 hr tablet Take 10 mg by mouth every morning.  07/01/16   [provider]  ipratropium-albuterol (DUONEB) 0.5-2.5 (3) MG/3ML SOLN Take 3 mLs by nebulization every 4 (four) hours as needed (wheezing, Shortness of breath). 03/13/16   Johnson, Clanford L, MD  liraglutide (VICTOZA) 18 MG/3ML SOPN Inject 1.8 mg into the skin daily.     [provider]  potassium chloride (K-DUR) 10 MEQ tablet Take 1 tablet (10 mEq total) by mouth daily. Take extra tablet on Wednesdays and Saturdays (when taking Zaroxolyn) 02/14/17   Hosie Poisson, MD  predniSONE (DELTASONE) 10 MG tablet Prednisone 60 mg daily for 2 days followed by  Prednisone 40 mg daily for 3 days followed by  Prednisone 20 mg daily for 3 days. 02/12/17   Hosie Poisson, MD  sertraline (ZOLOFT) 100 MG tablet Take 150 mg by mouth daily.     [provider]  spironolactone (ALDACTONE) 25 MG tablet Take 1 tablet (25 mg total) by mouth daily. 02/12/17 05/13/17  Hosie Poisson, MD  sulfamethoxazole-trimethoprim (BACTRIM,SEPTRA) 400-80 MG tablet Take 1 tablet by mouth every 12 (twelve) hours. 02/12/17   Hosie Poisson, MD    Physical Exam: Vitals:   03/10/17 1113 03/10/17 1114 03/10/17 1119 03/10/17 1307  BP:  124/72  (!) 129/107  Pulse:  75  74  Resp:  19  (!) 21  Temp:  98.4 F (36.9 C)  98.4 F (36.9 C)  TempSrc:  Oral  Oral  SpO2:   (!) 80% 95% 97%  Weight: 88.5 kg (195 lb)     Height: _0  (1.549 m)        General:  Appears calm and comfortable Eyes:  PERRL, EOMI, normal lids, iris ENT:  grossly normal hearing, lips & tongue, mmm Neck:  no LAD, masses or thyromegaly Cardiovascular:  Faint heart sounds, regular rate and rhythm, 3+ lower extremity pitting edema  Respiratory: Diffuse wheezing, decreased breath sounds with crackles in the bases, increased effort, on 4 L nasal cannula with O2 saturations in the low 90s. Abdomen:  soft, ntnd, NABS Skin:  Various areas of excoriation throughout upper extremities, lower extremities and upper back. Lower extremities predominantly in the proximal dorsum to ankle region of erythema, honey crusting  and tenderness.  Musculoskeletal:  grossly normal tone BUE/BLE, good ROM, no bony abnormality Psychiatric:  grossly normal mood and affect, speech fluent and appropriate, AOx3 Neurologic:  CN 2-12 grossly intact, moves all extremities in coordinated fashion, sensation intact  Labs on Admission: I have personally reviewed following labs and imaging studies  CBC:  Recent Labs Lab 03/10/17 1119  WBC 7.1  HGB 14.5  HCT 46.4*  MCV 85.1  PLT 161   Basic Metabolic Panel:  Recent Labs Lab 03/10/17 1119  NA 134*  K 5.1  CL 96*  CO2 30  GLUCOSE 458*  BUN 33*  CREATININE 1.47*  CALCIUM 9.6   GFR: Estimated Creatinine Clearance: 36 mL/min (A) (by C-G formula based on SCr of 1.47 mg/dL (H)). Liver Function Tests: No results for input(s): AST, ALT, ALKPHOS, BILITOT, PROT, ALBUMIN in the last 168 hours. No results for input(s): LIPASE, AMYLASE in the last 168 hours. No results for input(s): AMMONIA in the last 168 hours. Coagulation Profile: No results for input(s): INR, PROTIME in the last 168 hours. Cardiac Enzymes: No results for input(s): CKTOTAL, CKMB, CKMBINDEX, TROPONINI in the last 168 hours. BNP (last 3 results)  Recent Labs  07/21/16 1210 07/29/16 1159  09/05/16 1131  PROBNP 2,696* 3,611* 2,929*   HbA1C: No results for input(s): HGBA1C in the last 72 hours. CBG: No results for input(s): GLUCAP in the last 168 hours. Lipid Profile: No results for input(s): CHOL, HDL, LDLCALC, TRIG, CHOLHDL, LDLDIRECT in the last 72 hours. Thyroid Function Tests: No results for input(s): TSH, T4TOTAL, FREET4, T3FREE, THYROIDAB in the last 72 hours. Anemia Panel: No results for input(s): VITAMINB12, FOLATE, FERRITIN, TIBC, IRON, RETICCTPCT in the last 72 hours. Urine analysis:    Component Value Date/Time   COLORURINE YELLOW 02/10/2017 0707   APPEARANCEUR HAZY (A) 02/10/2017 0707   LABSPEC 1.015 02/10/2017 0707   PHURINE 6.0 02/10/2017 0707   GLUCOSEU >=500 (A) 02/10/2017 0707   HGBUR SMALL (A) 02/10/2017 0707   BILIRUBINUR NEGATIVE 02/10/2017 0707   KETONESUR NEGATIVE 02/10/2017 0707   PROTEINUR 100 (A) 02/10/2017 0707   UROBILINOGEN 0.2 02/10/2013 1041   NITRITE POSITIVE (A) 02/10/2017 0707   LEUKOCYTESUR SMALL (A) 02/10/2017 0707    Creatinine Clearance: Estimated Creatinine Clearance: 36 mL/min (A) (by C-G formula based on SCr of 1.47 mg/dL (H)).  Sepsis Labs: _0 (procalcitonin:4,lacticidven:4) )No results found for this or any previous visit (from the past 240 hour(s)).   Radiological Exams on Admission: Dg Chest 2 View  Result Date: 03/10/2017 CLINICAL DATA:  Productive cough. EXAM: CHEST  2 VIEW COMPARISON:  02/09/2017.  CT 07/21/2016. FINDINGS: Mediastinum and hilar structures normal. Surgical clips and sutures right chest . Cardiomegaly with mild pulmonary vascular prominence. Partial clearing of mild pulmonary interstitial edema. No prominent pleural effusion. No pneumothorax. IMPRESSION: Cardiomegaly with mild pulmonary vascular prominence. Partial clearing of mild pulmonary interstitial edema. Minimal residual interstitial edema noted. Electronically Signed   By: Marcello Moores  Register   On: 03/10/2017 11:59    EKG:  Independently reviewed. Sinus, no ACS  Assessment/Plan Active Problems:   Diabetes mellitus type 2 in obese ([name])   Acute on chronic diastolic CHF (congestive heart failure) ([name])   HTN (hypertension)   Acute on chronic respiratory failure ([name])   CKD (chronic kidney disease), stage III   Chronic pain   Acute on chronic respiratory failure: Likely multifactorial including acute on chronic CHF exacerbation with advanced COPD and mild exacerbation. CXR as above with recent 10 pound weight gain and  marked lower extremity pitting edema. Requiring 4-6 L nasal cannula in order to maintain O2 saturations above 90% with baseline O2 requirement of 2.5. - Treatment of CHF and COPD as below - Wean O2 as able - Ambulatory O2 saturations prior to discharge as patient is likely to need home oxygen  Acute on chronic diastolic congestive heart failure: Last echo showing an EF of 44% grade 1 diastolic dysfunction. Due to funding patient has only been able to take her Lasix from time to time as opposed to daily as prescribed. - Strict I's and O's, daily weights - CHF protocol - Lasix 40 mg IV twice a day - Continue spinal active - BNP  COPD: Chronic O2 requirement of 2.5 L. Recently admitted for exacerbation episode. Wheezing with productive cough but not overly impressive. - Decadron 10 mg 1 - DuoNeb every 4 when necessary - Mucinex - Continue dulera - Respiratory viral panel, pro-calcitonin - No antibiotics at this time  CKD: Cr 1.47 near baseline.  - BMP in am  Chronic pain:  - continue neurontin  Depression - contineu zoloft  DM: Hyperglycemia noted with glucose greater than 450. No evidence of significant metabolic derangement such as DKA. Patient ran out of her home medication was unable to afford further medicines. - SSI - Case management for home medication assistance   DVT prophylaxis: Hep  Code Status: full  Family Communication: none  Disposition Plan: pending diuresis and  return to home 2.5L [place] O2 demand  Consults called: none  Admission status: observation    MERRELL, DAVID J MD [facility]  If 7PM-7AM, please contact night-coverage [email or web address] Password [facility]  03/10/2017, 4:54 PM

## 2017-03-10 NOTE — Progress Notes (Signed)
Patient arrived to 3east via stretcher, 3L o2 in place via nasal canula. Pt denies pain at this time.  Skin visualized, as abrasion to right knee, excoriation to upper right back and bilateral anterior ankles.   Pt voided 800 upon arrival.  Pt placed on telemetry. Wt/Ht/Vitals obtained.

## 2017-03-10 NOTE — ED Provider Notes (Signed)
[facility] DEPT Provider Note   CSN: [id number] Arrival date & time: 03/10/17  1109   History   Chief Complaint Chief Complaint  Patient presents with  . Shortness of Breath    HPI Amy Shepard is a 71 y.o. female.  Presented with 3 weeks of progressively worsening shortness of breath, cough, 10 pounds of weight gain in the past 2 weeks and bilateral lower extremity edema. Patient has a past medical history significant for CHF, COPD, depression, diabetes mellitus with insulin, pulmonary cancer, and hypertension. Patient stated that since her discharge on August 3 her shortness of breath has  progressively worsened to the extent that she is no longer able to climb the stairs without use of her hands for assistance requiring multiple pauses during the climb. Patient states that in addition, she has progressing bilateral lower extremity edema up to the level of the knee and 10 pounds weight gain in the past 2 weeks. The patient also complains of 3 falls in as many weeks which she attributes to the shortness of breath and ankle edema as she must often pause and lean against a wall, often causing her to lose her balance. Patient also attested to multiple secondary injuries from the falls which are slow to heal such a minor wounds on the hand, leg and feet. Patient states she is unable to afford the majority of her medications and as such is typically noncompliant with some of her medications. Patient stated that she has only taken 10 units of insulin this morning as her blood glucose measurement at home was 453. Patient stated that she has been taking her Lasix 40 mg typically twice daily as well as her spironolactone 25 mg daily.      Past Medical History:  Diagnosis Date  . Arthritis   . Cancer ([place])    lung ca  . CHF (congestive heart failure) ([place]) 02/2016  . COPD (chronic obstructive pulmonary disease) ([place])   . Depression   . Diabetes mellitus    [name]    dx  2008  . Hx  of seasonal allergies   . Hypercholesteremia   . Hypertension    on no medications  . Lung cancer ([place])   . Pneumonia    2008  @  [name]  . Shortness of breath dyspnea    doe    Patient Active Problem List   Diagnosis Date Noted  . Lactic acidosis 02/10/2017  . Acute respiratory failure with hypoxia ([place]) 02/09/2017  . CHF (congestive heart failure) ([place]) 02/09/2017  . Acute on chronic diastolic CHF (congestive heart failure) ([place]) 06/23/2016  . HTN (hypertension) 06/23/2016  . Diabetes mellitus type 2 in obese ([place]) 03/11/2016  . Depression 03/11/2016  . COPD (chronic obstructive pulmonary disease) ([place]) 03/10/2016  . S/P lumbar spinal fusion 02/22/2016  . Coronary artery disease involving native coronary artery 07/12/2014  . Pain in the chest   . Malignant neoplasm of lower lobe of right lung ([place]) 05/25/2014  . Lung cancer ([place]) 09/01/2012    Past Surgical History:  Procedure Laterality Date  . [place] or [facility] in [place]     bilateral cataracts  . LEFT HEART CATHETERIZATION WITH CORONARY ANGIOGRAM N/A 07/12/2014   Procedure: LEFT HEART CATHETERIZATION WITH CORONARY ANGIOGRAM;  Surgeon: [name], MD;  [facility] CATH LAB;  Service: Cardiovascular;  Laterality: N/A;  . MAXIMUM ACCESS (MAS)POSTERIOR LUMBAR INTERBODY FUSION (  PLIF) 2 LEVEL N/A 02/22/2016   Procedure: Lumbar three-four - Lumbar four-five  MAXIMUM ACCESS SURGERY  POSTERIOR LUMBAR INTERBODY FUSION;  Surgeon: [name], MD;  Location: [facility] NEURO ORS;  Service: Neurosurgery;  Laterality: N/A;  . THORACOTOMY Right 2010   lower  . TONSILLECTOMY      OB History    No data available       Home Medications    Prior to Admission medications   Medication Sig Start Date End Date Taking? Authorizing Provider  aspirin EC 81 MG tablet Take 81 mg by mouth every evening.    [provider]  Carboxymethylcellul-Glycerin (CLEAR EYES FOR  DRY EYES OP) Place 1 drop into both eyes 2 (two) times daily.    [provider]  Fluticasone-Salmeterol (ADVAIR) 250-50 MCG/DOSE AEPB Inhale 1 puff into the lungs 2 (two) times daily.    [provider]  furosemide (LASIX) 40 MG tablet Take 1 tablet (40 mg total) by mouth 2 (two) times daily. Take one dose at 8 am and then take the 2nd dose at 2 pm every day 07/01/16   Dorothy Spark, MD  gabapentin (NEURONTIN) 300 MG capsule Take 300 mg by mouth 3 (three) times daily.    [provider]  GLIPIZIDE XL 5 MG 24 hr tablet Take 10 mg by mouth every morning.  07/01/16   [provider]  ipratropium-albuterol (DUONEB) 0.5-2.5 (3) MG/3ML SOLN Take 3 mLs by nebulization every 4 (four) hours as needed (wheezing, Shortness of breath). 03/13/16   Johnson, Clanford L, MD  liraglutide (VICTOZA) 18 MG/3ML SOPN Inject 1.8 mg into the skin daily.     [provider]  potassium chloride (K-DUR) 10 MEQ tablet Take 1 tablet (10 mEq total) by mouth daily. Take extra tablet on Wednesdays and Saturdays (when taking Zaroxolyn) 02/14/17   Hosie Poisson, MD  predniSONE (DELTASONE) 10 MG tablet Prednisone 60 mg daily for 2 days followed by  Prednisone 40 mg daily for 3 days followed by  Prednisone 20 mg daily for 3 days. 02/12/17   Hosie Poisson, MD  sertraline (ZOLOFT) 100 MG tablet Take 150 mg by mouth daily.     [provider]  spironolactone (ALDACTONE) 25 MG tablet Take 1 tablet (25 mg total) by mouth daily. 02/12/17 05/13/17  Hosie Poisson, MD  sulfamethoxazole-trimethoprim (BACTRIM,SEPTRA) 400-80 MG tablet Take 1 tablet by mouth every 12 (twelve) hours. 02/12/17   Hosie Poisson, MD    Family History Family History  Problem Relation Age of Onset  . Diabetes Father   . Heart failure Father   . Cancer Sister   . Diabetes Brother   . Heart failure Brother     Social History Social History  Substance Use Topics  . Smoking status: Former Smoker    Packs/day:  1.00    Years: 30.00    Quit date: 07/14/2006  . Smokeless tobacco: Never Used  . Alcohol use No     Allergies   Amoxicillin   Review of Systems Review of Systems  Constitutional: Negative for chills and fever.  HENT: Negative for ear pain and sore throat.   Eyes: Negative for pain and visual disturbance.  Respiratory: Positive for cough, shortness of breath and wheezing.   Cardiovascular: Positive for leg swelling. Negative for chest pain and palpitations.  Gastrointestinal: Positive for nausea. Negative for abdominal pain, constipation, diarrhea and vomiting.  Genitourinary: Positive for decreased urine volume. Negative for dysuria, hematuria and urgency.  Musculoskeletal: Negative for  arthralgias and back pain.  Skin: Negative for color change and rash.  Neurological: Positive for weakness and headaches. Negative for seizures and syncope.  All other systems reviewed and are negative.    Physical Exam Updated Vital Signs BP (!) 129/107   Pulse 74   Temp 98.4 F (36.9 C) (Oral)   Resp (!) 21   Ht _0  (1.549 m)   Wt 88.5 kg (195 lb)   SpO2 97%   BMI 36.84 kg/m   Physical Exam  Constitutional: She is oriented to person, place, and time. She appears well-developed and well-nourished. No distress.  HENT:  Head: Normocephalic and atraumatic.  Eyes: Conjunctivae are normal. No scleral icterus.  Neck: Neck supple. No JVD present.  Cardiovascular: Normal rate and regular rhythm.   Pulmonary/Chest: Effort normal. No respiratory distress. She has wheezes.  Abdominal: Soft. She exhibits no distension. There is no tenderness.  Musculoskeletal: She exhibits edema (Wish rash at the ankles bilaterally ) and tenderness.  Neurological: She is alert and oriented to person, place, and time.  Skin: Skin is warm and dry. Rash (Multiple excoriations over the posterior neck as well as the dorsal aspect of the arms. ) noted.  Psychiatric: She has a normal mood and affect. Her behavior  is normal. Thought content normal.  Nursing note and vitals reviewed.    ED Treatments / Results  Labs (all labs ordered are listed, but only abnormal results are displayed) Labs Reviewed  BASIC METABOLIC PANEL - Abnormal; Notable for the following:       Result Value   Sodium 134 (*)    Chloride 96 (*)    Glucose, Bld 458 (*)    BUN 33 (*)    Creatinine, Ser 1.47 (*)    GFR calc non Af Amer 35 (*)    GFR calc Af Amer 41 (*)    All other components within normal limits  CBC - Abnormal; Notable for the following:    RBC 5.45 (*)    HCT 46.4 (*)    RDW 16.2 (*)    All other components within normal limits  BRAIN NATRIURETIC PEPTIDE  I-STAT TROPONIN, ED    EKG  EKG Interpretation None       Radiology Dg Chest 2 View  Result Date: 03/10/2017 CLINICAL DATA:  Productive cough. EXAM: CHEST  2 VIEW COMPARISON:  02/09/2017.  CT 07/21/2016. FINDINGS: Mediastinum and hilar structures normal. Surgical clips and sutures right chest . Cardiomegaly with mild pulmonary vascular prominence. Partial clearing of mild pulmonary interstitial edema. No prominent pleural effusion. No pneumothorax. IMPRESSION: Cardiomegaly with mild pulmonary vascular prominence. Partial clearing of mild pulmonary interstitial edema. Minimal residual interstitial edema noted. Electronically Signed   By: Marcello Moores  Register   On: 03/10/2017 11:59    Procedures Procedures (including critical care time)  Medications Ordered in ED Medications  furosemide (LASIX) injection 80 mg (80 mg Intravenous Given 03/10/17 1532)     Initial Impression / Assessment and Plan / ED Course  I have reviewed the triage vital signs and the nursing notes.  Pertinent labs & imaging results that were available during my care of the patient were reviewed by me and considered in my medical decision making (see chart for details).    Given the patient's presentation she is most likely suffering from congestive heart failure with  underlying COPD and diabetes mellitus complicating the clinic picture by contributing to her presentation. Given the patient's inability to ambulate at home even with assistance, as  well as her uncontrolled diabetes, and progressively worsening shortness of breath with history of COPD, significant treatment is warratned. Patient was assessed and monitored for improvement which did not occur prompting admission.  2:50PM, BMP BNP, CSC troponin, chest x-ray, EKG ordered. IV lasix 2m given.  3:30 PM, Patient not significantly improved.   3:49 PM, Placed consult to [facility] for admission given her volume overload status. In addition, given her presentation with associated wheezing and increased sputum production/cough COPD exacerbation is possible as well. As she has uncontrolled diabetes in addition to her current presentation, chances for recovery at home with self care are minuscule. Patient without adequate medication at home. BNP pending. Patient requiring additional O2 above baseline oxygen.  4:07 PM, [facility] returned call. Agreed to admit patient for treatment of her volume overload and increased oxygen demand.   The patient was seen and evaluated with Dr. LVanita Panda  Final Clinical Impressions(s) / ED Diagnoses   Final diagnoses:  Acute on chronic congestive heart failure, unspecified heart failure type ([place]  Chronic obstructive pulmonary disease, unspecified COPD type ([place]  Type 2 diabetes mellitus with complication, with long-term current use of insulin ([facility]    New Prescriptions New Prescriptions   No medications on file     HKathi Ludwig MD 03/10/17 2037    LCarmin Muskrat MD 03/13/17 [phone]

## 2017-03-10 NOTE — ED Notes (Signed)
Placed pt on 4L [place] sats 95%

## 2017-03-10 NOTE — Progress Notes (Signed)
9 Units of Novolog ordered per MD.  Will continue to monitor.  Ameerah Huffstetler, RN

## 2017-03-10 NOTE — ED Triage Notes (Signed)
Pt states she has a Hx of CHF. PT states she is SOB, retaining fluid, gained 10lbs in two weeks. Denies CP. Pt states she is nauseous.

## 2017-03-11 ENCOUNTER — Observation Stay (HOSPITAL_COMMUNITY): Payer: Medicare Other

## 2017-03-11 ENCOUNTER — Encounter (HOSPITAL_COMMUNITY): Payer: Self-pay | Admitting: Student

## 2017-03-11 DIAGNOSIS — I313 Pericardial effusion (noninflammatory): Secondary | ICD-10-CM | POA: Diagnosis present

## 2017-03-11 DIAGNOSIS — Z794 Long term (current) use of insulin: Secondary | ICD-10-CM | POA: Diagnosis not present

## 2017-03-11 DIAGNOSIS — E1122 Type 2 diabetes mellitus with diabetic chronic kidney disease: Secondary | ICD-10-CM | POA: Diagnosis present

## 2017-03-11 DIAGNOSIS — I5033 Acute on chronic diastolic (congestive) heart failure: Secondary | ICD-10-CM

## 2017-03-11 DIAGNOSIS — I2510 Atherosclerotic heart disease of native coronary artery without angina pectoris: Secondary | ICD-10-CM | POA: Diagnosis present

## 2017-03-11 DIAGNOSIS — G8929 Other chronic pain: Secondary | ICD-10-CM | POA: Diagnosis present

## 2017-03-11 DIAGNOSIS — I10 Essential (primary) hypertension: Secondary | ICD-10-CM | POA: Diagnosis not present

## 2017-03-11 DIAGNOSIS — Z833 Family history of diabetes mellitus: Secondary | ICD-10-CM | POA: Diagnosis not present

## 2017-03-11 DIAGNOSIS — F329 Major depressive disorder, single episode, unspecified: Secondary | ICD-10-CM | POA: Diagnosis present

## 2017-03-11 DIAGNOSIS — N183 Chronic kidney disease, stage 3 (moderate): Secondary | ICD-10-CM

## 2017-03-11 DIAGNOSIS — E669 Obesity, unspecified: Secondary | ICD-10-CM

## 2017-03-11 DIAGNOSIS — I272 Pulmonary hypertension, unspecified: Secondary | ICD-10-CM | POA: Diagnosis not present

## 2017-03-11 DIAGNOSIS — E1169 Type 2 diabetes mellitus with other specified complication: Secondary | ICD-10-CM

## 2017-03-11 DIAGNOSIS — J441 Chronic obstructive pulmonary disease with (acute) exacerbation: Secondary | ICD-10-CM | POA: Diagnosis present

## 2017-03-11 DIAGNOSIS — I509 Heart failure, unspecified: Secondary | ICD-10-CM | POA: Diagnosis not present

## 2017-03-11 DIAGNOSIS — Z9981 Dependence on supplemental oxygen: Secondary | ICD-10-CM | POA: Diagnosis not present

## 2017-03-11 DIAGNOSIS — I361 Nonrheumatic tricuspid (valve) insufficiency: Secondary | ICD-10-CM

## 2017-03-11 DIAGNOSIS — Z79899 Other long term (current) drug therapy: Secondary | ICD-10-CM | POA: Diagnosis not present

## 2017-03-11 DIAGNOSIS — Z8249 Family history of ischemic heart disease and other diseases of the circulatory system: Secondary | ICD-10-CM | POA: Diagnosis not present

## 2017-03-11 DIAGNOSIS — I130 Hypertensive heart and chronic kidney disease with heart failure and stage 1 through stage 4 chronic kidney disease, or unspecified chronic kidney disease: Secondary | ICD-10-CM | POA: Diagnosis present

## 2017-03-11 DIAGNOSIS — I2781 Cor pulmonale (chronic): Secondary | ICD-10-CM | POA: Diagnosis not present

## 2017-03-11 DIAGNOSIS — J9621 Acute and chronic respiratory failure with hypoxia: Secondary | ICD-10-CM

## 2017-03-11 DIAGNOSIS — E1142 Type 2 diabetes mellitus with diabetic polyneuropathy: Secondary | ICD-10-CM | POA: Diagnosis present

## 2017-03-11 DIAGNOSIS — J449 Chronic obstructive pulmonary disease, unspecified: Secondary | ICD-10-CM | POA: Diagnosis not present

## 2017-03-11 DIAGNOSIS — Z6836 Body mass index (BMI) 36.0-36.9, adult: Secondary | ICD-10-CM | POA: Diagnosis not present

## 2017-03-11 DIAGNOSIS — E785 Hyperlipidemia, unspecified: Secondary | ICD-10-CM | POA: Diagnosis present

## 2017-03-11 DIAGNOSIS — I2721 Secondary pulmonary arterial hypertension: Secondary | ICD-10-CM | POA: Diagnosis present

## 2017-03-11 DIAGNOSIS — E118 Type 2 diabetes mellitus with unspecified complications: Secondary | ICD-10-CM | POA: Diagnosis not present

## 2017-03-11 DIAGNOSIS — E7800 Pure hypercholesterolemia, unspecified: Secondary | ICD-10-CM | POA: Diagnosis present

## 2017-03-11 DIAGNOSIS — R7989 Other specified abnormal findings of blood chemistry: Secondary | ICD-10-CM | POA: Diagnosis not present

## 2017-03-11 DIAGNOSIS — E1165 Type 2 diabetes mellitus with hyperglycemia: Secondary | ICD-10-CM | POA: Diagnosis present

## 2017-03-11 LAB — ECHOCARDIOGRAM COMPLETE
CHL CUP MV DEC (S): 236
CHL CUP TV REG PEAK VELOCITY: 362 cm/s
E/e' ratio: 15.82
EWDT: 236 ms
FS: 40 % (ref 28–44)
Height: 61 in
IV/PV OW: 1.18
LA ID, A-P, ES: 41 mm
LA diam end sys: 41 mm
LA diam index: 2.25 cm/m2
LAVOLA4C: 40.1 mL
LV E/e'average: 15.82
LV PW d: 12.8 mm — AB (ref 0.6–1.1)
LV TDI E'MEDIAL: 3.71
LVEEMED: 15.82
LVOT area: 2.84 cm2
LVOT diameter: 19 mm
MV pk E vel: 58.7 m/s
MVPKAVEL: 73.7 m/s
RV LATERAL S' VELOCITY: 5.84 cm/s
RV sys press: 67 mmHg
TAPSE: 15.1 mm
TR max vel: 362 cm/s
Weight: 2931.2 oz

## 2017-03-11 LAB — RESPIRATORY PANEL BY PCR
ADENOVIRUS-RVPPCR: NOT DETECTED
BORDETELLA PERTUSSIS-RVPCR: NOT DETECTED
CORONAVIRUS 229E-RVPPCR: NOT DETECTED
CORONAVIRUS HKU1-RVPPCR: NOT DETECTED
CORONAVIRUS NL63-RVPPCR: NOT DETECTED
Chlamydophila pneumoniae: NOT DETECTED
Coronavirus OC43: NOT DETECTED
Influenza A: NOT DETECTED
Influenza B: NOT DETECTED
Metapneumovirus: NOT DETECTED
Mycoplasma pneumoniae: NOT DETECTED
PARAINFLUENZA VIRUS 2-RVPPCR: NOT DETECTED
Parainfluenza Virus 1: NOT DETECTED
Parainfluenza Virus 3: NOT DETECTED
Parainfluenza Virus 4: NOT DETECTED
Respiratory Syncytial Virus: NOT DETECTED
Rhinovirus / Enterovirus: NOT DETECTED

## 2017-03-11 LAB — CBC
HEMATOCRIT: 47.5 % — AB (ref 36.0–46.0)
HEMOGLOBIN: 15.1 g/dL — AB (ref 12.0–15.0)
MCH: 27 pg (ref 26.0–34.0)
MCHC: 31.8 g/dL (ref 30.0–36.0)
MCV: 85 fL (ref 78.0–100.0)
Platelets: 207 10*3/uL (ref 150–400)
RBC: 5.59 MIL/uL — AB (ref 3.87–5.11)
RDW: 15.7 % — ABNORMAL HIGH (ref 11.5–15.5)
WBC: 9.2 10*3/uL (ref 4.0–10.5)

## 2017-03-11 LAB — BASIC METABOLIC PANEL
Anion gap: 10 (ref 5–15)
BUN: 34 mg/dL — AB (ref 6–20)
CHLORIDE: 95 mmol/L — AB (ref 101–111)
CO2: 32 mmol/L (ref 22–32)
CREATININE: 1.39 mg/dL — AB (ref 0.44–1.00)
Calcium: 9.4 mg/dL (ref 8.9–10.3)
GFR calc Af Amer: 43 mL/min — ABNORMAL LOW (ref >60)
GFR calc non Af Amer: 37 mL/min — ABNORMAL LOW (ref >60)
GLUCOSE: 195 mg/dL — AB (ref 65–99)
Potassium: 4 mmol/L (ref 3.5–5.1)
SODIUM: 137 mmol/L (ref 135–145)

## 2017-03-11 LAB — GLUCOSE, CAPILLARY
GLUCOSE-CAPILLARY: 279 mg/dL — AB (ref 65–99)
Glucose-Capillary: 328 mg/dL — ABNORMAL HIGH (ref 65–99)
Glucose-Capillary: 250 mg/dL — ABNORMAL HIGH (ref 65–99)
Glucose-Capillary: 433 mg/dL — ABNORMAL HIGH (ref 65–99)
Glucose-Capillary: 515 mg/dL (ref 65–99)

## 2017-03-11 LAB — HEMOGLOBIN A1C
Hgb A1c MFr Bld: 13.6 % — ABNORMAL HIGH (ref 4.8–5.6)
Mean Plasma Glucose: 343.62 mg/dL

## 2017-03-11 LAB — PHOSPHORUS: PHOSPHORUS: 4.3 mg/dL (ref 2.5–4.6)

## 2017-03-11 LAB — MAGNESIUM: MAGNESIUM: 2 mg/dL (ref 1.7–2.4)

## 2017-03-11 MED ORDER — INSULIN GLARGINE 100 UNIT/ML ~~LOC~~ SOLN
15.0000 [IU] | Freq: Two times a day (BID) | SUBCUTANEOUS | Status: DC
  Administered 2017-03-11: 15 [IU] via SUBCUTANEOUS
  Filled 2017-03-11 (×2): qty 0.15

## 2017-03-11 MED ORDER — INSULIN ASPART 100 UNIT/ML ~~LOC~~ SOLN: 0.0000 [IU] | Freq: Three times a day (TID) | SUBCUTANEOUS | Status: DC

## 2017-03-11 MED ORDER — ORAL CARE MOUTH RINSE
15.0000 mL | Freq: Two times a day (BID) | OROMUCOSAL | Status: DC
  Administered 2017-03-11 – 2017-03-15 (×6): 15 mL via OROMUCOSAL

## 2017-03-11 MED ORDER — INSULIN ASPART 100 UNIT/ML ~~LOC~~ SOLN
4.0000 [IU] | Freq: Three times a day (TID) | SUBCUTANEOUS | Status: DC
Start: 2017-03-11 — End: 2017-03-15
  Administered 2017-03-11 – 2017-03-15 (×10): 4 [IU] via SUBCUTANEOUS

## 2017-03-11 MED ORDER — LORAZEPAM 0.5 MG PO TABS
0.5000 mg | ORAL_TABLET | Freq: Four times a day (QID) | ORAL | Status: DC | PRN
  Administered 2017-03-11 – 2017-03-14 (×4): 0.5 mg via ORAL
  Filled 2017-03-11 (×4): qty 1

## 2017-03-11 MED ORDER — INSULIN ASPART 100 UNIT/ML ~~LOC~~ SOLN
0.0000 [IU] | Freq: Every day | SUBCUTANEOUS | Status: DC
  Administered 2017-03-11 – 2017-03-12 (×2): 3 [IU] via SUBCUTANEOUS
  Administered 2017-03-13 – 2017-03-14 (×2): 2 [IU] via SUBCUTANEOUS

## 2017-03-11 MED ORDER — DIPHENHYDRAMINE HCL 25 MG PO CAPS
25.0000 mg | ORAL_CAPSULE | Freq: Two times a day (BID) | ORAL | Status: DC | PRN
  Administered 2017-03-11 – 2017-03-13 (×2): 25 mg via ORAL
  Filled 2017-03-11 (×2): qty 1

## 2017-03-11 MED ORDER — INSULIN GLARGINE 100 UNIT/ML ~~LOC~~ SOLN
15.0000 [IU] | Freq: Every day | SUBCUTANEOUS | Status: DC
  Administered 2017-03-11: 15 [IU] via SUBCUTANEOUS
  Filled 2017-03-11: qty 0.15

## 2017-03-11 MED ORDER — INSULIN ASPART 100 UNIT/ML ~~LOC~~ SOLN: 4.0000 [IU] | Freq: Three times a day (TID) | SUBCUTANEOUS | Status: DC

## 2017-03-11 MED ORDER — INSULIN ASPART 100 UNIT/ML ~~LOC~~ SOLN
0.0000 [IU] | Freq: Three times a day (TID) | SUBCUTANEOUS | Status: DC
Start: 2017-03-11 — End: 2017-03-15
  Administered 2017-03-11: 20 [IU] via SUBCUTANEOUS
  Administered 2017-03-12: 11 [IU] via SUBCUTANEOUS
  Administered 2017-03-12: 15 [IU] via SUBCUTANEOUS
  Administered 2017-03-13: 4 [IU] via SUBCUTANEOUS
  Administered 2017-03-13: 7 [IU] via SUBCUTANEOUS
  Administered 2017-03-13 – 2017-03-14 (×2): 4 [IU] via SUBCUTANEOUS
  Administered 2017-03-14: 11 [IU] via SUBCUTANEOUS
  Administered 2017-03-14: 3 [IU] via SUBCUTANEOUS
  Administered 2017-03-15: 4 [IU] via SUBCUTANEOUS

## 2017-03-11 NOTE — Plan of Care (Signed)
Problem: Pain Managment: Goal: General experience of comfort will improve Outcome: Progressing Monitored pain using pain scale.   Provided tylenol PRN per order. Reassessed pain

## 2017-03-11 NOTE — Progress Notes (Signed)
Patient is resting comfortably in the bed, stated that she feels much better.  Blood sugar re-checked and its coming down.  Will continue to monitor.  Shateka Petrea, RN

## 2017-03-11 NOTE — Care Management Note (Addendum)
Case Management Note  Patient Details  Name: Amy Shepard MRN: [id number] Date of Birth: 1945/10/17  Subjective/Objective:    Acute on Chronic Resp Failure              Action/Plan: CM talked to patient at the bedside; PCP is Dr Maurice Small; has private insurance with Medicare A&B ( pt just recently received part B); she stated that she is working with Alinda Sierras at Dr [facility] office for further assistance; DME - has home oxygen, nebulizer machine, cane and walker at home;  CM following for DCP. Mindi Slicker RN,MHA,BSN  02/09/2017 - ED CM received consult from [place] on [place]. Patient presented to [facility] ED wth SOB. Patient reported to staff that she has been cutting back on her oxygen because she cannot afford to pay the bill. CM met with patient at bedside patient reports haviing Medicare Part A but does not have Part B that pays for outpatient services. Patient states, she recently stopped working and paperwork was not processed in time for Part B.  patient has been without coverage for several months, she has also been without several of her medications as well. Patient states she now has forms completed to submit for Medicare Part B.  Patient said she will have a family member take form to [facility] office ED evaluation still in progress. CM will continue to follow for transitional care planning. This patient will also benefit from [facility] Management, CM will place a [facility] referral.  Wendi Maya RN CM  Expected Discharge Date:    possibly 03/16/2017             Expected Discharge Plan:  [place]  Discharge planning Services  CM Consult  Status of Service:  In process, will continue to follow   Sherrilyn Rist [phone] 03/11/2017, 10:56 AM

## 2017-03-11 NOTE — Progress Notes (Signed)
Pt educated about safety and importance of bed alarm during the night however pt refuses to be on bed/chair alarm. Will continue to round on patient.   Jaidin Richison, RN

## 2017-03-11 NOTE — Progress Notes (Signed)
  Echocardiogram 2D Echocardiogram has been performed.  Amy Shepard 03/11/2017, 2:25 PM

## 2017-03-11 NOTE — Progress Notes (Signed)
Patient slept for the rest of the night. VS stable. Will continue to monitor.  Jeovanny Cuadros, RN

## 2017-03-11 NOTE — Progress Notes (Signed)
Chaplain presented to the patient, introduced self, and explained the nature of the visit was to support her emotional and spiritual needs.  Chaplain provided prayer for continued healing, and financial benefits for her medication management. Chaplain will follow up as needed. Chaplain Yaakov Guthrie [phone]

## 2017-03-11 NOTE — Progress Notes (Signed)
Inpatient Diabetes Program Recommendations  AACE/ADA: New Consensus Statement on Inpatient Glycemic Control (2015)  Target Ranges:  Prepandial:   less than 140 mg/dL      Peak postprandial:   less than 180 mg/dL (1-2 hours)      Critically ill patients:  140 - 180 mg/dL   Lab Results  Component Value Date   GLUCAP 250 (H) 03/11/2017   HGBA1C 12.9 (H) 02/10/2017    Review of Glycemic Control Results for Amy Shepard, Amy Shepard (MRN [id number]) as of 03/11/2017 10:46  Ref. Range 03/10/2017 21:06 03/11/2017 03:56 03/11/2017 07:30  Glucose-Capillary Latest Ref Range: 65 - 99 mg/dL 474 (H) 328 (H) 250 (H)   Diabetes history: DM2 Outpatient Diabetes medications: Glyburide XL 10 qd Current orders for Inpatient glycemic control: Novolog correction 0-9 units tid + 0-5 units hs  Inpatient Diabetes Program Recommendations:  DM coordinator Barnie Alderman spoke with patient on last admission (02/12/17) concerning A1c 12.9 and medication compliance. On last admission, patient did not want to start outpatient insulin regimen although elevated A1c. Please consider: -Lantus 10 units daily -Novolog 3 unit tid meal coverage if eats 50%  Will follow.  Thank you, Nani Gasser. Zailen Albarran, RN, MSN, CDE  Diabetes Coordinator Inpatient Glycemic Control Team Team Pager [phone] (8am-5pm) 03/11/2017 10:51 AM

## 2017-03-11 NOTE — Progress Notes (Signed)
Patient saying that she is anxious, walking will help with anxiety. Walked with the patient down the hall. PRN Lorazepam given. Patient stated that she might leave AMA. Talked with patient and with her daughter.Patient agreeable to stay. Will continue to monitor.   Will continue to monitor.  Makiyla Linch, RN

## 2017-03-11 NOTE — Progress Notes (Signed)
PROGRESS NOTE    Amy Shepard  OLM:[id number] DOB: 07/21/1945 DOA: 03/10/2017 PCP: Maurice Small, MD   Brief Narrative: 71 y.o. female with medical history significant of lung cancer, CHF, COPD on home o2 @ 2.5 L, depression, diabetes, hyperlipidemia, hypertension. Patient presents with worsening shortness of breath and productive cough for approximately 2 weeks.  Assessment & Plan:  # Acute on chronic diastolic congestive heart failure:  -Last echo in January 2018 consistent with LVEF of 55% with severe pulmonary hypertension. Patient was significant lower extremity edema. Elevated BNP, significant weight gain and chest x-ray shows mild pulmonary edema. Continue IV Lasix. Repeat echo. Cardiology consult requested. -Negative by 2.8 L since admission.  #Acute on chronic respiratory failure with hypoxia: Patient uses about 2.5 L of oxygen at home. She required 4-6 L of oxygen at admission. Now improving to 3 L. Continue current treatment and oxygen therapy.  #History of COPD with chronic respiratory failure: Continue diuretics. Follow-up respiratory panel. today not elevated. Continue current bronchodilators.  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 Case manager evaluation ongoing. Continue current sliding scale and added Lantus today. Monitor blood sugar level. Check A1c level. -On Neurontin  # Chronic kidney disease stage III: Serum creatinine level around baseline.  Holding potassium chloride because of elevated serum potassium level. Monitor BMP. Avoid nephrotoxins.  DVT prophylaxis: Heparin subcutaneous Code Status: Full code Family Communication: No family at bedside Disposition Plan: Currently admitted. PT OT evaluation    Consultants:   Cardiology  Procedures: None Antimicrobials: None  Subjective: Seen and examined at bedside. Reported shortness of breath is better. Denied chest  pain. Cough is improving. Still having lower extremities edema.  Objective: Vitals:   03/11/17 0132 03/11/17 0500 03/11/17 0632 03/11/17 0749  BP: 133/71  (!) 96/56   Pulse: 78  79   Resp: 18  20   Temp: 98 F (36.7 C)  97.8 F (36.6 C)   TempSrc: Oral  Oral   SpO2: 92%  92% 95%  Weight:  83.1 kg (183 lb 3.2 oz)    Height:        Intake/Output Summary (Last 24 hours) at 03/11/17 1150 Last data filed at 03/11/17 0932  Gross per 24 hour  Intake              480 ml  Output             3300 ml  Net            -2820 ml   Filed Weights   03/10/17 1113 03/10/17 1800 03/11/17 0500  Weight: 88.5 kg (195 lb) 84.3 kg (185 lb 12.8 oz) 83.1 kg (183 lb 3.2 oz)    Examination:  General exam: Appears calm and comfortable  Respiratory system: Bibasal crackles, respiratory effort normal Cardiovascular system: S1 & S2 heard, RRR. Bilateral lower extremities pitting edema Gastrointestinal system: Abdomen is nondistended, soft and nontender. Normal bowel sounds heard. Central nervous system: Alert and oriented. No focal neurological deficits. Extremities: Symmetric 5 x 5 power. Skin: No rashes, lesions or ulcers Psychiatry: Judgement and insight appear normal. Mood & affect appropriate.     Data Reviewed: I have personally reviewed following labs and imaging studies  CBC:  Recent Labs Lab 03/10/17 1119 03/11/17 0616  WBC 7.1 9.2  HGB 14.5 15.1*  HCT 46.4* 47.5*  MCV 85.1 85.0  PLT 202 010   Basic Metabolic Panel:  Recent Labs  Lab 03/10/17 1119 03/11/17 0616  NA 134* 137  K 5.1 4.0  CL 96* 95*  CO2 30 32  GLUCOSE 458* 195*  BUN 33* 34*  CREATININE 1.47* 1.39*  CALCIUM 9.6 9.4  MG  --  2.0  PHOS  --  4.3   GFR: Estimated Creatinine Clearance: 36.8 mL/min (A) (by C-G formula based on SCr of 1.39 mg/dL (H)). Liver Function Tests: No results for input(s): AST, ALT, ALKPHOS, BILITOT, PROT, ALBUMIN in the last 168 hours. No results for input(s): LIPASE, AMYLASE in the  last 168 hours. No results for input(s): AMMONIA in the last 168 hours. Coagulation Profile: No results for input(s): INR, PROTIME in the last 168 hours. Cardiac Enzymes: No results for input(s): CKTOTAL, CKMB, CKMBINDEX, TROPONINI in the last 168 hours. BNP (last 3 results)  Recent Labs  07/21/16 1210 07/29/16 1159 09/05/16 1131  PROBNP 2,696* 3,611* 2,929*   HbA1C: No results for input(s): HGBA1C in the last 72 hours. CBG:  Recent Labs Lab 03/10/17 2106 03/11/17 0356 03/11/17 0730 03/11/17 1125  GLUCAP 474* 328* 250* 433*   Lipid Profile: No results for input(s): CHOL, HDL, LDLCALC, TRIG, CHOLHDL, LDLDIRECT in the last 72 hours. Thyroid Function Tests: No results for input(s): TSH, T4TOTAL, FREET4, T3FREE, THYROIDAB in the last 72 hours. Anemia Panel: No results for input(s): VITAMINB12, FOLATE, FERRITIN, TIBC, IRON, RETICCTPCT in the last 72 hours. Sepsis Labs:  Recent Labs Lab 03/10/17 1119  PROCALCITON <0.10    No results found for this or any previous visit (from the past 240 hour(s)).       Radiology Studies: Dg Chest 2 View  Result Date: 03/10/2017 CLINICAL DATA:  Productive cough. EXAM: CHEST  2 VIEW COMPARISON:  02/09/2017.  CT 07/21/2016. FINDINGS: Mediastinum and hilar structures normal. Surgical clips and sutures right chest . Cardiomegaly with mild pulmonary vascular prominence. Partial clearing of mild pulmonary interstitial edema. No prominent pleural effusion. No pneumothorax. IMPRESSION: Cardiomegaly with mild pulmonary vascular prominence. Partial clearing of mild pulmonary interstitial edema. Minimal residual interstitial edema noted. Electronically Signed   By: Marcello Moores  Register   On: 03/10/2017 11:59        Scheduled Meds: . aspirin EC  81 mg Oral QPM  . dexamethasone  10 mg Intravenous Once  . furosemide  40 mg Intravenous BID  . gabapentin  300 mg Oral TID  . guaiFENesin  1,200 mg Oral BID  . heparin  5,000 Units Subcutaneous Q8H    . insulin aspart  0-5 Units Subcutaneous QHS  . insulin aspart  0-9 Units Subcutaneous TID WC  . insulin glargine  15 Units Subcutaneous Daily  . mouth rinse  15 mL Mouth Rinse BID  . mometasone-formoterol  2 puff Inhalation BID  . sertraline  150 mg Oral Daily  . sodium chloride flush  3 mL Intravenous Q12H  . spironolactone  25 mg Oral Daily   Continuous Infusions: . sodium chloride       LOS: 0 days    Ketzia Guzek Tanna Furry, MD [facility] Pager [phone]  If 7PM-7AM, please contact night-coverage [email or web address] Password [email or web address] 03/11/2017, 11:50 AM

## 2017-03-11 NOTE — Consult Note (Signed)
Cardiology Consult    Patient ID: Amy Shepard; [id number]; Dec 05, 1945   Admit date: 03/10/2017 Date of Consult: 03/11/2017  Primary Care Provider: Maurice Small, MD Primary Cardiologist: [name]  Patient Profile    Uniqua Shepard is a 71 y.o. female with past medical history of CAD (nonobstructive by cath in 06/2014 with low-risk NST in 01/2016), chronic diastolic CHF, COPD (on 3.1R [place] at night), lung cancer (in remission), Type 2 DM, HTN, and HLD  who is being seen today for the evaluation of CHF at the request of Dr. Carolin Sicks.   History of Present Illness    Ms. Difrancesco was last examined by [name] in 08/2016 and reported continued dyspnea on exertion and was utilizing supplemental O2 at night but not during the day. O2 saturations were at 90% at the time of her visit. Weight was stable at 181 lbs and she was continued on Lasix 61m BID with Spironolactone 265mdaily being started.   She was recently admitted from 7/30 - 02/13/2017 for acute hypoxic respiratory failure secondary to acute on chronic diastolic CHF. She diuresed well with IV Lasix and weight at time of discharge was 189 lbs.   [facility] for worsening dyspnea and lower extremity edema, reporting a 10 lb weight gain in 2 weeks.   She reports noticing dyspnea on exertion and abdominal distention starting last week. Was evaluated by her PCP and weight was up to 195 lbs (baseline 185 lbs). Her Lasix dosing was not adjusted at that time. She denies any recent chest pain, palpitations, orthopnea, or PND. She reports good compliance with her Lasix and denies missing any doses. She is compliant with a low-sodium diet but says she consumes "too many Dr. PeMalachi Bondsodas" on most days.   Oxygen saturation were in the 70's - 80's on arrival. Initial labs showed WBC of 7.1, Hgb 14.5, platelets 202, Na+ 134, K+ 5.1, glucose 458, creatinine 1.47, BNP 914, and initial troponin  negative. CXR showed cardiomegaly with mild pulmonary vascular congestion and pulmonary interstitial edema. EKG showed NSR, HR 74, with RVH.   She has been started on IV Lasix 4058mID with a net output of -2.8L thus far. She does report improvement in her dyspnea and edema but says it is not at baseline.    Past Medical History   Past Medical History:  Diagnosis Date  . Arthritis   . CAD (coronary artery disease)    a. 2015: cath showing nonobstructive CAD. Medical management recommended. b. 2017: low-risk NST  . Cancer ([place]  lung ca  . [place]ive [place]) [place]  . COPD (chronic obstructive pulmonary disease) ([facility] . Depression   . Diabetes mellitus    [place]  . Hx of seasonal allergies   . Hypercholesteremia   . Hypertension    on no medications  . [place] cancer ([place] . On home oxygen therapy    "2.5L; 24/7" (03/10/2017)  . Pneumonia    [facility] Shortness of breath dyspnea    doe     Allergies:   Allergies  Allergen Reactions  . Amoxicillin Anaphylaxis, Hives and Rash    Has patient had a PCN reaction causing immediate rash, facial/tongue/throat swelling, SOB or lightheadedness with hypotension: YES Positive reaction causing SEVERE RASH INVOLVING MUCUS MEMBRANES/SKIN NECROSIS: YES Reaction that required HOSPITALIZATION: YES Reaction  occurring within the last 10 years: NO    Home Medications:   Home Medications:  Prior to Admission medications   Medication Sig Start Date End Date Taking? Authorizing Provider  aspirin EC 81 MG tablet Take 81 mg by mouth every evening.   Yes [provider]  Carboxymethylcellul-Glycerin (CLEAR EYES FOR DRY EYES OP) Place 1 drop into both eyes 2 (two) times daily.   Yes [provider]  Fluticasone-Salmeterol (ADVAIR) 250-50 MCG/DOSE AEPB Inhale 1 puff into the lungs 2 (two) times daily.   Yes [provider]  furosemide (LASIX) 40 MG tablet Take 1 tablet  (40 mg total) by mouth 2 (two) times daily. Take one dose at 8 am and then take the 2nd dose at 2 pm every day 07/01/16  Yes Dorothy Spark, MD  gabapentin (NEURONTIN) 300 MG capsule Take 300 mg by mouth 3 (three) times daily.   Yes [provider]  GLIPIZIDE XL 5 MG 24 hr tablet Take 10 mg by mouth every morning.  07/01/16  Yes [provider]  ipratropium-albuterol (DUONEB) 0.5-2.5 (3) MG/3ML SOLN Take 3 mLs by nebulization every 4 (four) hours as needed (wheezing, Shortness of breath). 03/13/16  Yes Johnson, Clanford L, MD  potassium chloride (K-DUR) 10 MEQ tablet Take 1 tablet (10 mEq total) by mouth daily. Take extra tablet on Wednesdays and Saturdays (when taking Zaroxolyn) 02/14/17  Yes Hosie Poisson, MD  spironolactone (ALDACTONE) 25 MG tablet Take 1 tablet (25 mg total) by mouth daily. 02/12/17 05/13/17 Yes Hosie Poisson, MD  predniSONE (DELTASONE) 10 MG tablet Prednisone 60 mg daily for 2 days followed by  Prednisone 40 mg daily for 3 days followed by  Prednisone 20 mg daily for 3 days. Patient not taking: Reported on 03/10/2017 02/12/17   Hosie Poisson, MD  sertraline (ZOLOFT) 100 MG tablet Take 150 mg by mouth daily.     [provider]    Inpatient Medications    Scheduled Meds: . aspirin EC  81 mg Oral QPM  . dexamethasone  10 mg Intravenous Once  . furosemide  40 mg Intravenous BID  . gabapentin  300 mg Oral TID  . guaiFENesin  1,200 mg Oral BID  . heparin  5,000 Units Subcutaneous Q8H  . insulin aspart  0-5 Units Subcutaneous QHS  . insulin aspart  0-9 Units Subcutaneous TID WC  . insulin aspart  10 Units Subcutaneous Once  . mouth rinse  15 mL Mouth Rinse BID  . mometasone-formoterol  2 puff Inhalation BID  . sertraline  150 mg Oral Daily  . sodium chloride flush  3 mL Intravenous Q12H  . spironolactone  25 mg Oral Daily   Continuous Infusions: . sodium chloride     PRN Meds: sodium chloride, acetaminophen **OR** acetaminophen,  ipratropium-albuterol, LORazepam, ondansetron **OR** ondansetron (ZOFRAN) IV, sodium chloride flush  Family History    Family History  Problem Relation Age of Onset  . Diabetes Father   . Heart failure Father   . Cancer Sister   . Diabetes Brother   . Heart failure Brother     Social History    Social History   Social History  . Marital status: Divorced    Spouse name: N/A  . Number of children: N/A  . Years of education: N/A   Occupational History  . Not on file.   Social History Main Topics  . Smoking status: Former Smoker    Packs/day: 1.00    Years: 30.00  Quit date: 07/14/2006  . Smokeless tobacco: Never Used  . Alcohol use No  . Drug use: No  . Sexual activity: Not on file   Other Topics Concern  . Not on file   Social History Narrative  . No narrative on file     Review of Systems    General:  No chills, fever, night sweats or weight changes.  Cardiovascular:  No chest pain, orthopnea, palpitations, paroxysmal nocturnal dyspnea. Positive for dyspnea on exertion and edema.  Dermatological: No rash, lesions/masses Respiratory: No cough, dyspnea Urologic: No hematuria, dysuria Abdominal:   No nausea, vomiting, diarrhea, bright red blood per rectum, melena, or hematemesis Neurologic:  No visual changes, wkns, changes in mental status. All other systems reviewed and are otherwise negative except as noted above.  Physical Exam/Data    Blood pressure (!) 96/56, pulse 79, temperature 97.8 F (36.6 C), temperature source Oral, resp. rate 20, height _0  (1.549 m), weight 183 lb 3.2 oz (83.1 kg), SpO2 95 %.  General: Pleasant, elderly Caucasian female appearing in NAD Psych: Normal affect. Neuro: Alert and oriented X 3. Moves all extremities spontaneously. HEENT: Normal  Neck: Supple without bruits. JVD at 9cm. Lungs:  Resp regular and unlabored, rales at bases bilaterally with expiratory wheezing along upper lung fields. Heart: RRR no s3, s4, or  murmurs. Abdomen: Soft, non-tender, non-distended, BS + x 4.  Extremities: No clubbing or cyanosis. 1+ pitting edema up to mid-shins bilaterally. DP/PT/Radials 2+ and equal bilaterally.   EKG:  The EKG was personally reviewed and demonstrates:  NSR, HR 74, with RVH.   Labs/Studies     Relevant CV Studies:  Cardiac Catheterization: 06/2014 HEMODYNAMICS:  Aortic pressure 126/63 mmHg; LV pressure 123 / 6 mmHg; LVEDP 15 mmHg  ANGIOGRAPHIC DATA:   The left main coronary artery is  widely patent.  The left anterior descending artery is heavily calcified in the proximal and mid vessel. There is eccentric 40% proximal narrowing. There is 50-60% mid narrowing after the origin of the large diagonal. No hemodynamically significant obstruction is noted in the LAD.  The left circumflex artery is widely patent. There is moderate calcification in the mid and distal vessel.  There is 3 obtuse marginal branches. The second marginal is a dominant vessel that contains ectasia proximally but is otherwise normal.  The right coronary arterydominant and widely patent. Luminal irregularities are noted in the mid and distal vessel. [name]   LEFT VENTRICULOGRAM:  Left ventricular angiogram was done in the 30 RAO projection and revealed normal LV cavity size and EF of 60%   IMPRESSIONS:   1. Nonobstructive coronary artery disease 2. Three-vessel coronary calcification 3. Normal left ventricular systolic function  RECOMMENDATION: Risk factor modification.   Echocardiogram: 07/16/2016 Study Conclusions  - Left ventricle: The cavity size was normal. Wall thickness was   normal. Systolic function was normal. The estimated ejection   fraction was in the range of 55% to 60%. Doppler parameters are   consistent with abnormal left ventricular relaxation (grade 1   diastolic dysfunction). Doppler parameters are consistent with   elevated mean left atrial filling pressure. - Ventricular septum: The  contour showed diastolic flattening and   systolic flattening. These changes are consistent with RV volume   and pressure overload. - Left atrium: The atrium was mildly dilated. - Right ventricle: The cavity size was moderately dilated. Systolic   function was moderately reduced. - Right atrium: The atrium was moderately dilated. - Atrial septum: No defect or  patent foramen ovale was identified. - Tricuspid valve: There was mild-moderate regurgitation directed   centrally. - Pulmonary arteries: Systolic pressure was moderately to severely   increased. PA peak pressure: 75 mm Hg (S). - Pericardium, extracardiac: A small to moderate pericardial   effusion was identified circumferential to the heart.  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 Consider interval pulmonary embolism or other   causes of worsening cor pulmonale.  Laboratory Data:  Chemistry  Recent Labs Lab 03/10/17 1119 03/11/17 0616  NA 134* 137  K 5.1 4.0  CL 96* 95*  CO2 30 32  GLUCOSE 458* 195*  BUN 33* 34*  CREATININE 1.47* 1.39*  CALCIUM 9.6 9.4  GFRNONAA 35* 37*  GFRAA 41* 43*  ANIONGAP 8 10    No results for input(s): PROT, ALBUMIN, AST, ALT, ALKPHOS, BILITOT in the last 168 hours. Hematology  Recent Labs Lab 03/10/17 1119 03/11/17 0616  WBC 7.1 9.2  RBC 5.45* 5.59*  HGB 14.5 15.1*  HCT 46.4* 47.5*  MCV 85.1 85.0  MCH 26.6 27.0  MCHC 31.3 31.8  RDW 16.2* 15.7*  PLT 202 207   Cardiac EnzymesNo results for input(s): TROPONINI in the last 168 hours.   Recent Labs Lab 03/10/17 1156  TROPIPOC 0.01    BNP  Recent Labs Lab 03/10/17 1119  BNP 914.5*    DDimer No results for input(s): DDIMER in the last 168 hours.  Radiology/Studies:  Dg Chest 2  View  Result Date: 03/10/2017 CLINICAL DATA:  Productive cough. EXAM: CHEST  2 VIEW COMPARISON:  02/09/2017.  CT 07/21/2016. FINDINGS: Mediastinum and hilar structures normal. Surgical clips and sutures right chest . Cardiomegaly with mild pulmonary vascular prominence. Partial clearing of mild pulmonary interstitial edema. No prominent pleural effusion. No pneumothorax. IMPRESSION: Cardiomegaly with mild pulmonary vascular prominence. Partial clearing of mild pulmonary interstitial edema. Minimal residual interstitial edema noted. Electronically Signed   By: Marcello Moores  Register   On: 03/10/2017 11:59     Assessment & Plan    1. Acute on Chronic Diastolic CHF - she presented with worsening dyspnea on exertion and abdominal distention starting last week with an associated weight gain of 10 lbs (baseline 181 -185 lbs).  - she denies any associated chest pain, palpitations, orthopnea, or PND.  - BNP elevated to 914 on admission with CXR showing pulmonary vascular congestion and pulmonary interstitial edema.  - has been started on IV Lasix 39m BID with a net output of -2.8L thus far. Would continue with IV Lasix at current dosing. She was on PO Lasix 442mBID prior to admission.  - sodium and fluid restriction reviewed with the patient. She plans to purchase scales upon hospital discharge. Obtain daily weights and I&O's while admitted.   2. CAD - nonobstructive by cath in 06/2014 with low-risk NST in 01/2016. - she denies any recent chest pain. Has baseline dyspnea on exertion.  - initial troponin negative and EKG without acute ischemic changes. - continue ASA. Not on statin therapy. Will check FLP.   3. HTN - BP has been variable at 96/55 - 136/107 in the past 24 hours. - continue Spironolactone 2528maily and IV Lasix.   4. Stage 3 CKD - baseline 1.3 - 1.4 - creatinine 1.47 on admission, improved to 1.39  this AM.   5. COPD - on 2.5L nasal cannula at night. Encouraged by Pulmonology to use  24/7 but she reports being unable to do so due to financial limitations.   - per admitting team.   Signed, Erma Heritage, PA-C 03/11/2017, 11:00 AM Pager: [phone]   Patient seen and examined. Agree with assessment and plan. Ms. Windie Marasco is a 71 year old female who has a history of prior nonobstructive CAD documented in December 2015 who had a subsequent low risk nuclear stress test in July 2017.  She has a history of type 2 diabetes mellitus, hypertension, hyperlipidemia, COPD, chronic diastolic heart failure, and has been treated for lung cancer.  She had developed recent acute hypoxic respiratory failure leading to hospitalization 4 weeks ago, which was felt secondary to acute on chronic diastolic heart failure.  At discharge, her weight was 189 after diuresing well with IV Lasix.  Over the past several weeks she has noticed worsening dyspnea with lower extremity swelling associated with a 10 pound weight gain leading to her hospitalization yesterday.  Since admission, she has begun to diuresed well with IV Lasix and so far has a net urine output of -2780.  She is breathing better.  She denies chest pressure.   On exam, her blood pressure is 106/51 with a pulse in the 70s and O2 sat of 95%.  Sclerae anicteric.  There appeared to be a malar rash.  JVD approximated 8 cm.  She had decreased breath sounds with no audible wheezing.  Presently on my exam.  Rhythm was regular with 1/6 systolic murmur.  There was no rub.  She had central adiposity.  There was 1+ lower extremity edema to the pretibial region.  Neurologic exam is grossly nonfocal.  Her chest x-ray shows cardiomegaly with interstitial edema.  Laboratories notable for a BNP of 914.  Renal function has improved today to a creatinine of 1.39 from 1.47 yesterday.  She is not anemic.  Previously, an echo Doppler study in January 2018 showed an EF of 55-60% with grade 1 diastolic disc function and RV volume/pressure overload, moderate  right ventricular dilation with reduced RV function, moderate RA dilation, and significant pulmonary hypertension with a PA peak pressure at 75 mm.  At that time there was a small-to-moderate pericardial effusion.  Recommend a follow-up echo Doppler study for reassessment of right ventricular dilation with reduced function and significant pulmonary hypertension.  At present, recommend continuation of IV diuresis. Will follow.    Troy Sine, MD, [facility] 03/11/2017 5:21 PM

## 2017-03-12 DIAGNOSIS — I509 Heart failure, unspecified: Secondary | ICD-10-CM

## 2017-03-12 LAB — BASIC METABOLIC PANEL
ANION GAP: 11 (ref 5–15)
BUN: 38 mg/dL — ABNORMAL HIGH (ref 6–20)
CALCIUM: 9.2 mg/dL (ref 8.9–10.3)
CHLORIDE: 97 mmol/L — AB (ref 101–111)
CO2: 30 mmol/L (ref 22–32)
Creatinine, Ser: 1.42 mg/dL — ABNORMAL HIGH (ref 0.44–1.00)
GFR calc non Af Amer: 36 mL/min — ABNORMAL LOW (ref >60)
GFR, EST AFRICAN AMERICAN: 42 mL/min — AB (ref >60)
GLUCOSE: 124 mg/dL — AB (ref 65–99)
POTASSIUM: 3.9 mmol/L (ref 3.5–5.1)
Sodium: 138 mmol/L (ref 135–145)

## 2017-03-12 LAB — GLUCOSE, CAPILLARY
GLUCOSE-CAPILLARY: 114 mg/dL — AB (ref 65–99)
GLUCOSE-CAPILLARY: 281 mg/dL — AB (ref 65–99)
Glucose-Capillary: 254 mg/dL — ABNORMAL HIGH (ref 65–99)
Glucose-Capillary: 318 mg/dL — ABNORMAL HIGH (ref 65–99)

## 2017-03-12 LAB — LIPID PANEL
CHOL/HDL RATIO: 7.4 ratio
Cholesterol: 236 mg/dL — ABNORMAL HIGH (ref 0–200)
HDL: 32 mg/dL — AB (ref >40)
LDL CALC: 165 mg/dL — AB (ref 0–99)
TRIGLYCERIDES: 194 mg/dL — AB (ref <150)
VLDL: 39 mg/dL (ref 0–40)

## 2017-03-12 LAB — D-DIMER, QUANTITATIVE: D-Dimer, Quant: 1.11 ug/mL-FEU — ABNORMAL HIGH (ref 0.00–0.50)

## 2017-03-12 MED ORDER — INSULIN GLARGINE 100 UNIT/ML ~~LOC~~ SOLN
10.0000 [IU] | Freq: Two times a day (BID) | SUBCUTANEOUS | Status: DC
  Administered 2017-03-12 – 2017-03-13 (×3): 10 [IU] via SUBCUTANEOUS
  Filled 2017-03-12 (×3): qty 0.1

## 2017-03-12 MED ORDER — ROSUVASTATIN CALCIUM 40 MG PO TABS
40.0000 mg | ORAL_TABLET | Freq: Every day | ORAL | Status: DC
  Administered 2017-03-12 – 2017-03-14 (×3): 40 mg via ORAL
  Filled 2017-03-12 (×3): qty 1

## 2017-03-12 NOTE — Progress Notes (Signed)
Pt was offered bath and she stated later

## 2017-03-12 NOTE — Progress Notes (Signed)
PROGRESS NOTE    Amy Shepard  HUT:[id number] DOB: [birth year] DOA: 03/10/2017 PCP: Maurice Small, MD   Brief Narrative: 71 y.o. female with medical history significant of lung cancer, CHF, COPD on home o2 @ 2.5 L, depression, diabetes, hyperlipidemia, hypertension. Patient presents with worsening shortness of breath and productive cough for approximately 2 weeks.  Assessment & Plan:  # Acute on chronic diastolic congestive heart failure: Patient had elevated BNP, significant weight gain and x-ray consistent with mild pulmonary edema consistent with CHF. He still has lower extremity edema and mild shortness of breath. One to continue IV Lasix. Repeat echo with EF of 65-70%.  -Cardiology consult appreciated. -Negative by 3 L.. Patient was educated on low salt diet.  #Acute on chronic respiratory failure with hypoxia: Patient uses about 2.5 L of oxygen at home. She required 4-6 L of oxygen at admission. Now improving to 2 L. Continue current treatment and oxygen therapy.  #History of COPD with chronic respiratory failure: Continue diuretics. Respiratory panel negative. t Continue current bronchodilators.  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 Case manager evaluation ongoing.  -Added Lantus 10 mg twice a day, higher dose of sliding scale. A1c significantly elevated 13.6. Patient was educated on diabetes management and low sugar diet. On Neurontin. Discussed with the case manager regarding discharging with insulin.  # Chronic kidney disease stage III: Serum creatinine level around baseline.  Serum potassium level acceptable. Monitor BMP. Avoid nephrotoxins.  #Dyslipidemia. Patient has elevated LDL to 165. Started Crestor.  DVT prophylaxis: Heparin subcutaneous Code Status: Full code Family Communication: No family at bedside Disposition Plan: Currently admitted. PT OT evaluation    Consultants:    Cardiology  Procedures: None Antimicrobials: None  Subjective: Seen and examined at bedside. Shortness of breath is improving. Denied chest pain, nausea vomiting. Has lower extremity edema Objective: Vitals:   03/11/17 2236 03/12/17 0123 03/12/17 0640 03/12/17 0728  BP: 106/74 136/81 105/71   Pulse: 93 79 74   Resp:  20 20   Temp:  97.9 F (36.6 C) 97.6 F (36.4 C)   TempSrc:  Oral Oral   SpO2: 95% 95% 95% 96%  Weight:   83 kg (182 lb 14.4 oz)   Height:        Intake/Output Summary (Last 24 hours) at 03/12/17 1232 Last data filed at 03/12/17 0644  Gross per 24 hour  Intake              720 ml  Output              950 ml  Net             -230 ml   Filed Weights   03/10/17 1800 03/11/17 0500 03/12/17 0640  Weight: 84.3 kg (185 lb 12.8 oz) 83.1 kg (183 lb 3.2 oz) 83 kg (182 lb 14.4 oz)    Examination:  General exam: Not in distress Respiratory system: Bibasal crackles, aspirin effort normal Cardiovascular system: S1 & S2 heard, RRR. Bilateral lower extremities pitting edema mildly improving. Gastrointestinal system: Abdomen is nondistended, soft and nontender. Normal bowel sounds heard. Central nervous system: Alert and oriented. No focal neurological deficits. Extremities: Symmetric 5 x 5 power. Skin: No rashes, lesions or ulcers Psychiatry: Judgement and insight appear normal. Mood & affect appropriate.     Data Reviewed: I have personally reviewed following labs and imaging studies  CBC:  Recent Labs Lab 03/10/17 1119  03/11/17 0616  WBC 7.1 9.2  HGB 14.5 15.1*  HCT 46.4* 47.5*  MCV 85.1 85.0  PLT 202 408   Basic Metabolic Panel:  Recent Labs Lab 03/10/17 1119 03/11/17 0616 03/12/17 0352  NA 134* 137 138  K 5.1 4.0 3.9  CL 96* 95* 97*  CO2 30 32 30  GLUCOSE 458* 195* 124*  BUN 33* 34* 38*  CREATININE 1.47* 1.39* 1.42*  CALCIUM 9.6 9.4 9.2  MG  --  2.0  --   PHOS  --  4.3  --    GFR: Estimated Creatinine Clearance: 36 mL/min (A) (by  C-G formula based on SCr of 1.42 mg/dL (H)). Liver Function Tests: No results for input(s): AST, ALT, ALKPHOS, BILITOT, PROT, ALBUMIN in the last 168 hours. No results for input(s): LIPASE, AMYLASE in the last 168 hours. No results for input(s): AMMONIA in the last 168 hours. Coagulation Profile: No results for input(s): INR, PROTIME in the last 168 hours. Cardiac Enzymes: No results for input(s): CKTOTAL, CKMB, CKMBINDEX, TROPONINI in the last 168 hours. BNP (last 3 results)  Recent Labs  07/21/16 1210 07/29/16 1159 09/05/16 1131  PROBNP 2,696* 3,611* 2,929*   HbA1C:  Recent Labs  03/11/17 0616  HGBA1C 13.6*   CBG:  Recent Labs Lab 03/11/17 1125 03/11/17 1618 03/11/17 2115 03/12/17 0729 03/12/17 1126  GLUCAP 433* 515* 279* 114* 254*   Lipid Profile:  Recent Labs  03/12/17 0352  CHOL 236*  HDL 32*  LDLCALC 165*  TRIG 194*  CHOLHDL 7.4   Thyroid Function Tests: No results for input(s): TSH, T4TOTAL, FREET4, T3FREE, THYROIDAB in the last 72 hours. Anemia Panel: No results for input(s): VITAMINB12, FOLATE, FERRITIN, TIBC, IRON, RETICCTPCT in the last 72 hours. Sepsis Labs:  Recent Labs Lab 03/10/17 1119  PROCALCITON <0.10    Recent Results (from the past 240 hour(s))  Respiratory Panel by PCR     Status: None   Collection Time: 03/10/17  5:58 PM  Result Value Ref Range Status   Adenovirus NOT DETECTED NOT DETECTED Final   Coronavirus 229E NOT DETECTED NOT DETECTED Final   Coronavirus HKU1 NOT DETECTED NOT DETECTED Final   Coronavirus NL63 NOT DETECTED NOT DETECTED Final   Coronavirus OC43 NOT DETECTED NOT DETECTED Final   Metapneumovirus NOT DETECTED NOT DETECTED Final   Rhinovirus / Enterovirus NOT DETECTED NOT DETECTED Final   Influenza A NOT DETECTED NOT DETECTED Final   Influenza B NOT DETECTED NOT DETECTED Final   Parainfluenza Virus 1 NOT DETECTED NOT DETECTED Final   Parainfluenza Virus 2 NOT DETECTED NOT DETECTED Final   Parainfluenza  Virus 3 NOT DETECTED NOT DETECTED Final   Parainfluenza Virus 4 NOT DETECTED NOT DETECTED Final   Respiratory Syncytial Virus NOT DETECTED NOT DETECTED Final   Bordetella pertussis NOT DETECTED NOT DETECTED Final   Chlamydophila pneumoniae NOT DETECTED NOT DETECTED Final   Mycoplasma pneumoniae NOT DETECTED NOT DETECTED Final         Radiology Studies: No results found.      Scheduled Meds: . aspirin EC  81 mg Oral QPM  . furosemide  40 mg Intravenous BID  . gabapentin  300 mg Oral TID  . guaiFENesin  1,200 mg Oral BID  . heparin  5,000 Units Subcutaneous Q8H  . insulin aspart  0-20 Units Subcutaneous TID WC  . insulin aspart  0-5 Units Subcutaneous QHS  . insulin aspart  4 Units Subcutaneous TID WC  . insulin glargine  10 Units Subcutaneous BID  .  mouth rinse  15 mL Mouth Rinse BID  . mometasone-formoterol  2 puff Inhalation BID  . rosuvastatin  40 mg Oral q1800  . sertraline  150 mg Oral Daily  . sodium chloride flush  3 mL Intravenous Q12H  . spironolactone  25 mg Oral Daily   Continuous Infusions: . sodium chloride       LOS: 1 day    Arriah Wadle Tanna Furry, MD [facility] Pager [phone]  If 7PM-7AM, please contact night-coverage [email or web address] Password [email or web address] 03/12/2017, 12:32 PM

## 2017-03-12 NOTE — Progress Notes (Signed)
I came by to speak to Amy Shepard regarding her current hospitalization and HF diagnosis as well HF recommendations for home.  She is currently soundly asleep and snoring.  I will return at a later time to speak to her.

## 2017-03-12 NOTE — Evaluation (Signed)
Physical Therapy Evaluation Patient Details Name: Amy Shepard MRN: [id number] DOB: 03/21/1946 Today's Date: 03/12/2017   History of Present Illness  Pt is a 71 y.o. female with medical history significant of lung cancer, CHF, COPD, depression, diabetes, hyperlipidemia, hypertension, and spinal stenosis (s/p PLIF 2017). Patient presented with worsening shortness of breath and productive cough  Clinical Impression  Pt admitted with above diagnosis. Pt currently with functional limitations due to the deficits listed below (see PT Problem List). On eval, pt required supervision transfers and min guard assist ambulation in room without AD.  SpO2 85% on 2 L O2. Pt declined hallway ambulation due to lethargy.  Pt will benefit from skilled PT to increase their independence and safety with mobility to allow discharge to the venue listed below.  Recommend HHPT to address energy conservation and safety in home.     Follow Up Recommendations Home health PT;Supervision - Intermittent    Equipment Recommendations  None recommended by PT    Recommendations for Other Services       Precautions / Restrictions Precautions Precautions: Fall;Other (comment) Precaution Comments: watch O2 sats Restrictions Weight Bearing Restrictions: No      Mobility  Bed Mobility               General bed mobility comments: Pt up in recliner. Pt reports independence with bed mobility.  Transfers Overall transfer level: Needs assistance Equipment used: None Transfers: Sit to/from [facility] Sit to Stand: Supervision Stand pivot transfers: Supervision       General transfer comment: supervision for safety. No physical assist.  Ambulation/Gait Ambulation/Gait assistance: Min guard Ambulation Distance (Feet): 20 Feet Assistive device: None Gait Pattern/deviations: Step-through pattern;Decreased stride length     General Gait Details: min guard for safety. Assist to manage O2  tubing. Pt using furniture in room for support. SpO2 85% on 2 L O2. Pt declined hallway ambulation.  Stairs            Wheelchair Mobility    Modified Rankin (Stroke Patients Only)       Balance                                             Pertinent Vitals/Pain Pain Assessment: No/denies pain    Home Living Family/patient expects to be discharged to:: Private residence Living Arrangements: Children;Other relatives Available Help at Discharge: Family;Available 24 hours/day Type of Home: House Home Access: Level entry     Home Layout: Two level;Bed/bath upstairs Home Equipment: Shower seat;Walker - 2 wheels;Bedside commode      Prior Function Level of Independence: Independent         Comments: Uses shower seat for bathing.     Hand Dominance        Extremity/Trunk Assessment   Upper Extremity Assessment Upper Extremity Assessment: Generalized weakness    Lower Extremity Assessment Lower Extremity Assessment: Generalized weakness    Cervical / Trunk Assessment Cervical / Trunk Assessment: Kyphotic  Communication   Communication: No difficulties  Cognition Arousal/Alertness: Lethargic Behavior During Therapy: WFL for tasks assessed/performed Overall Cognitive Status: Within Functional Limits for tasks assessed                                 General Comments: Pt very lethargic requiring constant cues to stay awake. Pt reports  she is not sleeping well at night.      General Comments      Exercises     Assessment/Plan    PT Assessment Patient needs continued PT services  PT Problem List Decreased strength;Decreased mobility;Decreased safety awareness;Decreased activity tolerance;Decreased balance;Cardiopulmonary status limiting activity       PT Treatment Interventions Gait training;Stair training;Functional mobility training;Balance training;Therapeutic exercise;Patient/family education;Therapeutic  activities    PT Goals (Current goals can be found in the Care Plan section)  Acute Rehab PT Goals Patient Stated Goal: home PT Goal Formulation: With patient Time For Goal Achievement: 03/26/17 Potential to Achieve Goals: Good    Frequency Min 3X/week   Barriers to discharge        Co-evaluation               AM-PAC PT "6 Clicks" Daily Activity  Outcome Measure Difficulty turning over in bed (including adjusting bedclothes, sheets and blankets)?: None Difficulty moving from lying on back to sitting on the side of the bed? : None Difficulty sitting down on and standing up from a chair with arms (e.g., wheelchair, bedside commode, etc,.)?: None Help needed moving to and from a bed to chair (including a wheelchair)?: None Help needed walking in hospital room?: A Little Help needed climbing 3-5 steps with a railing? : A Little 6 Click Score: 22    End of Session Equipment Utilized During Treatment: Oxygen Activity Tolerance: Patient limited by lethargy Patient left: in chair;with call bell/phone within reach Nurse Communication: Mobility status PT Visit Diagnosis: Difficulty in walking, not elsewhere classified (R26.2)    Time: 9861-4830 PT Time Calculation (min) (ACUTE ONLY): 11 min   Charges:   PT Evaluation $PT Eval Moderate Complexity: 1 Mod     PT G Codes:        Lorrin Goodell, PT  Office # [phone] Pager [phone]   Lorriane Shire 03/12/2017, 1:02 PM

## 2017-03-12 NOTE — Progress Notes (Addendum)
Progress Note  Patient Name: Amy Shepard Date of Encounter: 03/12/2017  Primary Cardiologist: [name]  Subjective   Breathing slightly better, still with LE edema.   Inpatient Medications    Scheduled Meds: . aspirin EC  81 mg Oral QPM  . furosemide  40 mg Intravenous BID  . gabapentin  300 mg Oral TID  . guaiFENesin  1,200 mg Oral BID  . heparin  5,000 Units Subcutaneous Q8H  . insulin aspart  0-20 Units Subcutaneous TID WC  . insulin aspart  0-5 Units Subcutaneous QHS  . insulin aspart  4 Units Subcutaneous TID WC  . insulin glargine  10 Units Subcutaneous BID  . mouth rinse  15 mL Mouth Rinse BID  . mometasone-formoterol  2 puff Inhalation BID  . sertraline  150 mg Oral Daily  . sodium chloride flush  3 mL Intravenous Q12H  . spironolactone  25 mg Oral Daily   Continuous Infusions: . sodium chloride     PRN Meds: sodium chloride, acetaminophen **OR** acetaminophen, diphenhydrAMINE, ipratropium-albuterol, LORazepam, ondansetron **OR** ondansetron (ZOFRAN) IV, sodium chloride flush   Vital Signs    Vitals:   03/11/17 2236 03/12/17 0123 03/12/17 0640 03/12/17 0728  BP: 106/74 136/81 105/71   Pulse: 93 79 74   Resp:  20 20   Temp:  97.9 F (36.6 C) 97.6 F (36.4 C)   TempSrc:  Oral Oral   SpO2: 95% 95% 95% 96%  Weight:   182 lb 14.4 oz (83 kg)   Height:        Intake/Output Summary (Last 24 hours) at 03/12/17 1122 Last data filed at 03/12/17 0644  Gross per 24 hour  Intake              720 ml  Output              950 ml  Net             -230 ml   Filed Weights   03/10/17 1800 03/11/17 0500 03/12/17 0640  Weight: 185 lb 12.8 oz (84.3 kg) 183 lb 3.2 oz (83.1 kg) 182 lb 14.4 oz (83 kg)    Telemetry    SR - Personally Reviewed  ECG    NSR at 75; low voltage - Personally Reviewed  Physical Exam   General: Well developed, well nourished, female appearing in no acute distress. Head: Normocephalic, atraumatic.  Neck: Supple without bruits, +  JVD. Lungs:  Resp regular and unlabored, CTA. Heart: RRR, S1, S2, no S3, S4, or murmur; no rub. Abdomen: Soft, non-tender, non-distended with normoactive bowel sounds. No hepatomegaly. No rebound/guarding. No obvious abdominal masses. Extremities: No clubbing, cyanosis, 1+ LE edema to shins. Distal pedal pulses are 2+ bilaterally. Neuro: Alert and oriented X 3. Moves all extremities spontaneously. Psych: Normal affect.  Labs    Chemistry Recent Labs Lab 03/10/17 1119 03/11/17 0616 03/12/17 0352  NA 134* 137 138  K 5.1 4.0 3.9  CL 96* 95* 97*  CO2 30 32 30  GLUCOSE 458* 195* 124*  BUN 33* 34* 38*  CREATININE 1.47* 1.39* 1.42*  CALCIUM 9.6 9.4 9.2  GFRNONAA 35* 37* 36*  GFRAA 41* 43* 42*  ANIONGAP _0 Hematology Recent Labs Lab 03/10/17 1119 03/11/17 0616  WBC 7.1 9.2  RBC 5.45* 5.59*  HGB 14.5 15.1*  HCT 46.4* 47.5*  MCV 85.1 85.0  MCH 26.6 27.0  MCHC 31.3 31.8  RDW 16.2* 15.7*  PLT 202 207  Cardiac EnzymesNo results for input(s): TROPONINI in the last 168 hours.  Recent Labs Lab 03/10/17 1156  TROPIPOC 0.01     BNP Recent Labs Lab 03/10/17 1119  BNP 914.5*     DDimer No results for input(s): DDIMER in the last 168 hours.    Radiology    Dg Chest 2 View  Result Date: 03/10/2017 CLINICAL DATA:  Productive cough. EXAM: CHEST  2 VIEW COMPARISON:  02/09/2017.  CT 07/21/2016. FINDINGS: Mediastinum and hilar structures normal. Surgical clips and sutures right chest . Cardiomegaly with mild pulmonary vascular prominence. Partial clearing of mild pulmonary interstitial edema. No prominent pleural effusion. No pneumothorax. IMPRESSION: Cardiomegaly with mild pulmonary vascular prominence. Partial clearing of mild pulmonary interstitial edema. Minimal residual interstitial edema noted. Electronically Signed   By: Marcello Moores  Register   On: 03/10/2017 11:59    Cardiac Studies   TTE: 03/11/17  Study Conclusions  - Left ventricle: The cavity size was  normal. Systolic function was   vigorous. The estimated ejection fraction was in the range of 65%   to 70%. Wall motion was normal; there were no regional wall   motion abnormalities. Doppler parameters are consistent with   abnormal left ventricular relaxation (grade 1 diastolic   dysfunction). - Ventricular septum: The contour showed diastolic flattening and   systolic flattening. - Aortic valve: Mildly to moderately calcified annulus. - Right ventricle: The cavity size was severely dilated. Systolic   function was reduced. - Right atrium: The atrium was moderately to severely dilated. - Tricuspid valve: There was mild-moderate regurgitation. - Pulmonary arteries: Systolic pressure was moderately to severely   increased. PA peak pressure: 67 mm Hg (S).  Patient Profile     71 y.o. female with past medical history of CAD (nonobstructive by cath in 06/2014 with low-risk NST in 01/2016), chronic diastolic CHF, COPD (on 7.2I [place] at night), lung cancer (in remission), Type 2 DM, HTN, and HLD  who presented with acute CHF.    Assessment & Plan    1. Acute on Chronic Diastolic CHF - she presented with worsening dyspnea on exertion and abdominal distention starting last week with an associated weight gain of 10 lbs (baseline 181 -185 lbs).  - she denies any associated chest pain, palpitations, orthopnea, or PND.  - BNP elevated to 914 on admission with CXR showing pulmonary vascular congestion and pulmonary interstitial edema.  - was started on IV Lasix 53m BID with a net output of -3L. Sluggish UOP yesterday, but weight trending down.  -- Would continue with IV Lasix at current dosing. She was on PO Lasix 469mBID prior to admission.  - sodium and fluid restriction reviewed with the patient. She plans to purchase scales upon hospital discharge.  -- continue daily weights and I&O's while admitted.   2. CAD - nonobstructive by cath in 06/2014 with low-risk NST in 01/2016. - no chest pain  reported - initial troponin negative and EKG without acute ischemic changes. - continue ASA.  3. HTN - BP has been variable at 96/55 - 136/107 in the past 24 hours. - continue Spironolactone 2533maily and IV Lasix.   4. Stage 3 CKD - baseline 1.3 - 1.4 - creatinine 1.47 on admission, 1.42 today  5. COPD - on 2.5L nasal cannula at night. Encouraged by Pulmonology to use 24/7 but she reports being unable to do so due to financial limitations.   - per admitting team.   6. HL: LDL above goal at 165 -- add  Crestor 76m daily   Signed, LReino Bellis NP  03/12/2017, 11:22 AM  Pager # [phone]   Patient seen and examined. Agree with assessment and plan. I/O -3050 since admission with diuretic Rx. Echo EF 65 - 70%; gr 1 DD; Abnormal septal motion c/w RV pressure/volume overload; severe RA/RV dilation; mod/mod TR and significant PA HTN peak 67 mm H. Will check D-dimer. Sinus rhythm isn the 80s. Breathing better. Continue diuresis. Add rosuvastatin with LDL 165.   TTroy Sine MD, FUniversity Of Maryland Medical Center8/30/2018 1:57 PM

## 2017-03-13 ENCOUNTER — Inpatient Hospital Stay (HOSPITAL_COMMUNITY): Payer: Medicare Other

## 2017-03-13 DIAGNOSIS — R609 Edema, unspecified: Secondary | ICD-10-CM

## 2017-03-13 DIAGNOSIS — J449 Chronic obstructive pulmonary disease, unspecified: Secondary | ICD-10-CM

## 2017-03-13 DIAGNOSIS — R7989 Other specified abnormal findings of blood chemistry: Secondary | ICD-10-CM

## 2017-03-13 LAB — GLUCOSE, CAPILLARY
GLUCOSE-CAPILLARY: 201 mg/dL — AB (ref 65–99)
GLUCOSE-CAPILLARY: 157 mg/dL — AB (ref 65–99)
Glucose-Capillary: 160 mg/dL — ABNORMAL HIGH (ref 65–99)
Glucose-Capillary: 229 mg/dL — ABNORMAL HIGH (ref 65–99)

## 2017-03-13 LAB — BASIC METABOLIC PANEL
ANION GAP: 11 (ref 5–15)
BUN: 52 mg/dL — ABNORMAL HIGH (ref 6–20)
CHLORIDE: 99 mmol/L — AB (ref 101–111)
CO2: 27 mmol/L (ref 22–32)
Calcium: 9.2 mg/dL (ref 8.9–10.3)
Creatinine, Ser: 1.58 mg/dL — ABNORMAL HIGH (ref 0.44–1.00)
GFR calc Af Amer: 37 mL/min — ABNORMAL LOW (ref >60)
GFR calc non Af Amer: 32 mL/min — ABNORMAL LOW (ref >60)
GLUCOSE: 181 mg/dL — AB (ref 65–99)
POTASSIUM: 4.1 mmol/L (ref 3.5–5.1)
Sodium: 137 mmol/L (ref 135–145)

## 2017-03-13 MED ORDER — INSULIN GLARGINE 100 UNIT/ML ~~LOC~~ SOLN
15.0000 [IU] | Freq: Two times a day (BID) | SUBCUTANEOUS | Status: DC
  Administered 2017-03-13 – 2017-03-15 (×4): 15 [IU] via SUBCUTANEOUS
  Filled 2017-03-13 (×4): qty 0.15

## 2017-03-13 MED ORDER — LIVING BETTER WITH HEART FAILURE BOOK
Freq: Once | Status: AC
  Administered 2017-03-13: 1

## 2017-03-13 MED ORDER — TECHNETIUM TO 99M ALBUMIN AGGREGATED
4.0000 | Freq: Once | INTRAVENOUS | Status: AC | PRN
  Administered 2017-03-13: 4 via INTRAVENOUS

## 2017-03-13 MED ORDER — TECHNETIUM TC 99M DIETHYLENETRIAME-PENTAACETIC ACID
30.0000 | Freq: Once | INTRAVENOUS | Status: AC | PRN
  Administered 2017-03-13: 30 via RESPIRATORY_TRACT

## 2017-03-13 NOTE — Progress Notes (Signed)
Patient transported to [place] for VQ scan via bed.

## 2017-03-13 NOTE — Progress Notes (Signed)
Heart Failure Navigator Consult Note  Presentation:per Dr Claiborne Billings: Amy Shepard is a 71 y.o. female with past medical history of CAD (nonobstructive by cath in 06/2014 with low-risk NST in 01/2016), chronic diastolic CHF, COPD (on 5.3Z [place] at night), lung cancer (in remission), Type 2 DM, HTN, and HLD  who is being seen today for the evaluation of CHF at the request of Dr. Carolin Sicks.    Past Medical History:  Diagnosis Date  . Arthritis   . CAD (coronary artery disease)    a. 2015: cath showing nonobstructive CAD. Medical management recommended. b. 2017: low-risk NST  . Cancer ([place])    lung ca  . CHF (congestive heart failure) ([place]) 02/2016  . COPD (chronic obstructive pulmonary disease) ([place])   . Depression   . Diabetes mellitus    [name]    dx  2008  . Hx of seasonal allergies   . Hypercholesteremia   . Hypertension    on no medications  . Lung cancer ([place])   . On home oxygen therapy    "2.5L; 24/7" (03/10/2017)  . Pneumonia    2008  @  [name]  . Shortness of breath dyspnea    doe    Social History   Social History  . Marital status: Divorced    Spouse name: N/A  . Number of children: N/A  . Years of education: N/A   Social History Main Topics  . Smoking status: Former Smoker    Packs/day: 1.00    Years: 30.00    Quit date: 07/14/2006  . Smokeless tobacco: Never Used  . Alcohol use No  . Drug use: No  . Sexual activity: Not Asked   Other Topics Concern  . None   Social History Narrative  . None    ECHO:Study Conclusions--03/11/17  - Left ventricle: The cavity size was normal. Systolic function was   vigorous. The estimated ejection fraction was in the range of 65%   to 70%. Wall motion was normal; there were no regional wall   motion abnormalities. Doppler parameters are consistent with   abnormal left ventricular relaxation (grade 1 diastolic   dysfunction). - Ventricular septum: The contour showed diastolic flattening and   systolic  flattening. - Aortic valve: Mildly to moderately calcified annulus. - Right ventricle: The cavity size was severely dilated. Systolic   function was reduced. - Right atrium: The atrium was moderately to severely dilated. - Tricuspid valve: There was mild-moderate regurgitation. - Pulmonary arteries: Systolic pressure was moderately to severely   increased. PA peak pressure: 67 mm Hg (S).  ------------------------------------------------------------------- Study data:  Comparison was made to the study of 07/16/2016.  Study status:  Routine.  Procedure:  The patient reported no pain pre or post test. Transthoracic echocardiography. Image quality was adequate.  Study completion:  There were no complications. Transthoracic echocardiography.  M-mode, complete 2D, spectral Doppler, and color Doppler.  Birthdate:  Patient birthdate: 1946-01-04.  Age:  Patient is 71 yr old.  Sex:  Gender: female. BMI: 34.6 kg/m^2.  Blood pressure:     96/56  Patient status: Inpatient.  Study date:  Study date: 03/11/2017. Study time: 01:37 PM.  Location:  Bedside.  BNP    Component Value Date/Time   BNP 914.5 (H) 03/10/2017 1119   BNP 93.2 03/28/2016 1504    ProBNP    Component Value Date/Time   PROBNP 2,929 (H) 09/05/2016 1131     Education Assessment and Provision:  Detailed education and instructions provided on  heart failure disease management including the following:  Signs and symptoms of Heart Failure When to call the physician Importance of daily weights Low sodium diet Fluid restriction Medication management Anticipated future follow-up appointments  Patient education given on each of the above topics.  Patient acknowledges understanding and acceptance of all instructions.  I spoke with Amy Shepard regarding her current hospitalization and HF diagnosis.  She tells me that she lives home with her daughter and grandson.  She says that she is alone most of the day (daughter works all  day).  We discussed the importance of daily weights and when to contact the physician.  She did not have a scale and I have provided one for her use at home.  I briefly reviewed a low sodium diet and high sodium foods to avoid.  She also admits that she has had issue with affording medications at home.  She does not have a Medicare Part D (prescription plan) that she is aware of. I encouraged her that this would help her with medication costs.  She follows with [facility].    Education Materials:  "Living Better With Heart Failure" Booklet, Daily Weight Tracker Tool   High Risk Criteria for Readmission and/or Poor Patient Outcomes:   EF <30%- no 65-70%  2 or more admissions in 6 months- 2/ 38mo Difficult social situation- denies  Demonstrates medication noncompliance- yes admits that she has been unable to afford medications recently (no Plan D)   Barriers of Care:  Financial,  Knowledge and compliance  Discharge Planning:   Plans to return to home with daughter and grandson.  If she were to follow-up with [facility] outpatient she could benefit from SW and financial (medication assistance) resources.  [place] also [place] and [place].

## 2017-03-13 NOTE — Progress Notes (Signed)
VASCULAR LAB PRELIMINARY  PRELIMINARY  PRELIMINARY  PRELIMINARY  Bilateral lower extremity venous duplex completed.    Preliminary report:  Bilateral:  No evidence of DVT, superficial thrombosis, or Baker's Cyst.   Traeson Dusza, RVS 03/13/2017, 3:07 PM

## 2017-03-13 NOTE — Progress Notes (Signed)
Progress Note  Patient Name: Amy Shepard Date of Encounter: 03/13/2017  Primary Cardiologist: [name]  Subjective   Reports improved breathing back to baseline. Edema improving but not yet back to baseline. She reports her legs feel achy.  Inpatient Medications    Scheduled Meds: . aspirin EC  81 mg Oral QPM  . furosemide  40 mg Intravenous BID  . gabapentin  300 mg Oral TID  . guaiFENesin  1,200 mg Oral BID  . heparin  5,000 Units Subcutaneous Q8H  . insulin aspart  0-20 Units Subcutaneous TID WC  . insulin aspart  0-5 Units Subcutaneous QHS  . insulin aspart  4 Units Subcutaneous TID WC  . insulin glargine  10 Units Subcutaneous BID  . mouth rinse  15 mL Mouth Rinse BID  . mometasone-formoterol  2 puff Inhalation BID  . rosuvastatin  40 mg Oral q1800  . sertraline  150 mg Oral Daily  . sodium chloride flush  3 mL Intravenous Q12H  . spironolactone  25 mg Oral Daily   Continuous Infusions: . sodium chloride     PRN Meds: sodium chloride, acetaminophen **OR** acetaminophen, diphenhydrAMINE, ipratropium-albuterol, LORazepam, ondansetron **OR** ondansetron (ZOFRAN) IV, sodium chloride flush   Vital Signs    Vitals:   03/12/17 2022 03/12/17 2025 03/13/17 0517 03/13/17 0900  BP:   117/82 123/61  Pulse:   79 77  Resp:   20 18  Temp:   (!) 97.4 F (36.3 C) (!) 97.5 F (36.4 C)  TempSrc:   Oral   SpO2: (!) 78% 92% 93% 97%  Weight:   182 lb 1.6 oz (82.6 kg)   Height:        Intake/Output Summary (Last 24 hours) at 03/13/17 1020 Last data filed at 03/13/17 0956  Gross per 24 hour  Intake              943 ml  Output             1420 ml  Net             -477 ml   Filed Weights   03/11/17 0500 03/12/17 0640 03/13/17 0517  Weight: 183 lb 3.2 oz (83.1 kg) 182 lb 14.4 oz (83 kg) 182 lb 1.6 oz (82.6 kg)    Telemetry    NSR - Personally Reviewed  Physical Exam   GEN: No acute distress.  HEENT: Normocephalic, atraumatic, sclera non-icteric. Neck: No  JVD or bruits. Cardiac: RRR no murmurs, rubs, or gallops.  Radials/DP/PT 1+ and equal bilaterally.  Respiratory: Diminished slightly throughout but no rales, wheezing or rhonchi. Breathing is unlabored. GI: Soft, nontender, non-distended, BS +x 4. MS: no deformity. Extremities: No clubbing or cyanosis. 1+ BLE edema, both extremities are somewhat warm with mild erythema bilaterally. Distal pedal pulses intact Neuro:  AAOx3. Follows commands. Psych:  Responds to questions appropriately with a normal affect.  Labs    Chemistry Recent Labs Lab 03/11/17 0616 03/12/17 0352 03/13/17 0318  NA 137 138 137  K 4.0 3.9 4.1  CL 95* 97* 99*  CO2 32 30 27  GLUCOSE 195* 124* 181*  BUN 34* 38* 52*  CREATININE 1.39* 1.42* 1.58*  CALCIUM 9.4 9.2 9.2  GFRNONAA 37* 36* 32*  GFRAA 43* 42* 37*  ANIONGAP _0 Hematology Recent Labs Lab 03/10/17 1119 03/11/17 0616  WBC 7.1 9.2  RBC 5.45* 5.59*  HGB 14.5 15.1*  HCT 46.4* 47.5*  MCV 85.1 85.0  MCH  26.6 27.0  MCHC 31.3 31.8  RDW 16.2* 15.7*  PLT 202 207    Cardiac EnzymesNo results for input(s): TROPONINI in the last 168 hours.  Recent Labs Lab 03/10/17 1156  TROPIPOC 0.01     BNP Recent Labs Lab 03/10/17 1119  BNP 914.5*     DDimer  Recent Labs Lab 03/12/17 1450  DDIMER 1.11*     Radiology    No results found.  Cardiac Studies   2D Echo 03/11/17 Study Conclusions  - Left ventricle: The cavity size was normal. Systolic function was   vigorous. The estimated ejection fraction was in the range of 65%   to 70%. Wall motion was normal; there were no regional wall   motion abnormalities. Doppler parameters are consistent with   abnormal left ventricular relaxation (grade 1 diastolic   dysfunction). - Ventricular septum: The contour showed diastolic flattening and   systolic flattening. - Aortic valve: Mildly to moderately calcified annulus. - Right ventricle: The cavity size was severely dilated.  Systolic   function was reduced. - Right atrium: The atrium was moderately to severely dilated. - Tricuspid valve: There was mild-moderate regurgitation. - Pulmonary arteries: Systolic pressure was moderately to severely   increased. PA peak pressure: 67 mm Hg (S).  Patient Profile     71 y.o. female with past medical history of CAD (nonobstructive by cath in 06/2014 with low-risk NST in 01/2016), chronic diastolic CHF, COPD (on 3.4H [place] at night), lung cancer (in remission), CKD stage III (baseline 1.3-1.4) Type 2 DM, HTN, and HLD who presented with acute CHF.    Assessment & Plan    1. Acute on Chronic Diastolic CHF, severely dilated RV with reduced RV function, mild-mod TR and pulmonary HTN with PASP 29mHg - diuresed from 195 down to 182lb thus far. She reports dry weight in the low 180s. She continues to have some edema on exam. I suspect primary component at this point of right heart failure which could be related to her advanced COPD and prior noncompliance with nocturnal O2. D-dimer was checked yesterday and was elevated. Per d/w Dr. KClaiborne Billingswill obtain VQ scan to r/o PE as well as LE duplex to exclude DVT. Prior VQ neg in 09/2016. She is currently continued on IV Lasix 461mBID with mild bump in BUN/Cr, so may be nearing endpoint of diuresis - will hold further Lasix for today and f/u Cr in AM (got dose this AM so anticipate she will continue to diurese from this). Follow K given spiro use, may end up needing to hold this as well and just use Lasix or torsemide going forward. Place compression hose. RX CHF book.  2. Nonobstructive CAD - troponin negative. Continue ASA.  3. HTN - BP controlled, soft yesterday but stable today. Follow.  4. Stage 3 CKD - BUN/Cr have bumped. See above. Need to follow closely.  5. COPD - on 2.5L nasal cannula at night. Encouraged by Pulmonology to use 24/7 but she reports being unable to do so due to financial limitations. Per admitting team.   6. HL: LDL  above goal at 165 - Crestor added. If the patient is tolerating statin at time of follow-up appointment, would consider rechecking liver function/lipid panel in 6-8 weeks.  Signed, DaCharlie PitterPA-C  03/13/2017, 10:20 AM     Patient seen and examined. Agree with assessment and plan. No chest pain.  Breathing better. I/O -329622ince admission. Wt 182 today. Will obtain LE venous doppler and V/Q  scan today with D dimer mildly elevated to evaluate for thrombus and ventilation perfusion.  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

## 2017-03-13 NOTE — Evaluation (Addendum)
Occupational Therapy Evaluation and Discharge Patient Details Name: Amy Shepard MRN: [id number] DOB: [birth year] Today's Date: 03/13/2017    History of Present Illness Pt is a 71 y.o. female with medical history significant of lung cancer, CHF, COPD, depression, diabetes, hyperlipidemia, hypertension, and spinal stenosis (s/p PLIF 2017). Patient presented with worsening shortness of breath and productive cough. Found to have Acute on chronic diastolic congestive heart failure and Acute on chronic respiratory failure with hypoxia   Clinical Impression   This 71 yo female admitted with above presents to acute OT at an overall S level and will have intermittent S post D/C. No further OT needs, we will sign off. Sats on 2.5 liters at beginning of session were 91%, once up to bathroom and sink she dropped to 86%--PT upped O2 to 3 liters for ambulation.    Follow Up Recommendations  No OT follow up;Supervision - Intermittent    Equipment Recommendations  None recommended by OT       Precautions / Restrictions Precautions Precautions: Fall Precaution Comments: watch O2 sats Restrictions Weight Bearing Restrictions: No      Mobility Bed Mobility               General bed mobility comments: Pt up in recliner upon my arrival  Transfers Overall transfer level: Needs assistance Equipment used: None Transfers: Sit to/from Stand Sit to Stand: Supervision              Balance Overall balance assessment: Needs assistance Sitting-balance support: No upper extremity supported;Feet supported Sitting balance-Leahy Scale: Good         Standing balance comment: In room occassional UE A as she ambulated from recliner to bathroom, standing at sink on UE A while she washed hands, Bil UE A while ambulating in hallway with RW                           ADL either performed or assessed with clinical judgement   ADL                                          General ADL Comments: Overall at a S level due to still dropping O2 sats on 2.5 liters. Pt is aware of purse lipped breathing.     Vision Patient Visual Report: No change from baseline              Pertinent Vitals/Pain Pain Assessment:  (a little low back pain)     Hand Dominance Right   Extremity/Trunk Assessment Upper Extremity Assessment Upper Extremity Assessment: Overall WFL for tasks assessed           Communication Communication Communication: No difficulties   Cognition Arousal/Alertness: Awake/alert Behavior During Therapy: WFL for tasks assessed/performed Overall Cognitive Status: Within Functional Limits for tasks assessed                                                Home Living Family/patient expects to be discharged to:: Private residence Living Arrangements: Children;Other relatives Available Help at Discharge: Family;Available PRN/intermittently (dtr works and kids are in school) Type of Home: House Home Access: Level entry     Home Layout: Two level;Bed/bath upstairs Alternate Level Stairs-Number of Steps:  flight with landing Alternate Level Stairs-Rails: Right Bathroom Shower/Tub: Tub/shower unit;Curtain   Bathroom Toilet: Standard     Home Equipment: Clinical cytogeneticist - 2 wheels;Bedside commode;Cane - single point          Prior Functioning/Environment Level of Independence: Independent with assistive device(s)        Comments: Uses shower seat for bathing. Uses SPC and RW in house prn or "furniture walks" but definitely uses AD when out and about        OT Problem List: Impaired balance (sitting and/or standing)         OT Goals(Current goals can be found in the care plan section) Acute Rehab OT Goals Patient Stated Goal: to go home  OT Frequency:                AM-PAC PT "6 Clicks" Daily Activity     Outcome Measure Help from another person eating meals?: None Help from another person  taking care of personal grooming?: None Help from another person toileting, which includes using toliet, bedpan, or urinal?: None Help from another person bathing (including washing, rinsing, drying)?: None Help from another person to put on and taking off regular upper body clothing?: None Help from another person to put on and taking off regular lower body clothing?: None 6 Click Score: 24   End of Session Equipment Utilized During Treatment: Oxygen (2.5 liters)  Activity Tolerance: Patient tolerated treatment well Patient left:  (with PT getting ready to ambulate)  OT Visit Diagnosis: Unsteadiness on feet (R26.81)                Time: 1030-1314 OT Time Calculation (min): 18 min Charges:  OT General Charges $OT Visit: 1 Visit OT Evaluation $OT Eval Moderate Complexity: [address] [place], [place] [phone] 03/13/2017

## 2017-03-13 NOTE — Progress Notes (Addendum)
Inpatient Diabetes Program Recommendations  AACE/ADA: New Consensus Statement on Inpatient Glycemic Control (2015)  Target Ranges:  Prepandial:   less than 140 mg/dL      Peak postprandial:   less than 180 mg/dL (1-2 hours)      Critically ill patients:  140 - 180 mg/dL   Lab Results  Component Value Date   GLUCAP 160 (H) 03/13/2017   HGBA1C 13.6 (H) 03/11/2017    Review of Glycemic Control Results for JONNE, ROTE (MRN [id number]) as of 03/13/2017 11:08  Ref. Range 03/12/2017 07:29 03/12/2017 11:26 03/12/2017 16:12 03/12/2017 21:17 03/13/2017 07:42  Glucose-Capillary Latest Ref Range: 65 - 99 mg/dL 114 (H) 254 (H) 318 (H) 281 (H) 160 (H)   Inpatient Diabetes Program Recommendations:  Noted postprandial CBGs elevated. Please consider: -Increase Novolog meal coverage to 7 units tid if eats 50%  Thank you, Nani Gasser. Kaleeya Hancock, RN, MSN, CDE  Diabetes Coordinator Inpatient Glycemic Control Team Team Pager [phone] (8am-5pm) 03/13/2017 11:11 AM

## 2017-03-13 NOTE — Progress Notes (Signed)
PROGRESS NOTE    Amy Shepard  KPT:[id number] DOB: 1946/03/01 DOA: 03/10/2017 PCP: Maurice Small, MD   Brief Narrative: 71 y.o. female with medical history significant of lung cancer, CHF, COPD on home o2 @ 2.5 L, depression, diabetes, hyperlipidemia, hypertension. Patient presents with worsening shortness of breath and productive cough for approximately 2 weeks.  Assessment & Plan:  # Acute on chronic diastolic congestive heart failure: Patient had elevated BNP, significant weight gain and x-ray consistent with mild pulmonary edema consistent with CHF. He still has lower extremity edema and mild shortness of breath. Repeat echo with EF of 65-70% severely dilated right ventricle. Patient likely has cor pulmonale. Mild elevation in d-dimer and therefore ordered a VQ scan and Doppler lower extremities. Cardiology consult appreciated. Patient feels that her shortness of breath is better today. -Negative by 3.3 L. -Mild elevation in serum creatinine level today therefore holding further Lasix today. Received a dose of IV Lasix this morning.  Patient was educated on low salt diet.  #Acute on chronic respiratory failure with hypoxia: Patient uses about 2.5 L of oxygen at home. She required 4-6 L of oxygen at admission. Now improving to 2 L. Continue current treatment and oxygen therapy.  #History of COPD with chronic respiratory failure: Continue diuretics. Respiratory panel negative.  Continue current bronchodilators.  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 Case manager evaluation ongoing.  -Blood sugar level is still elevated therefore increased the dose of Lantus to 15 units twice a day. Continue sliding scale and 3 times a day with food. Education provided to the patient regarding diabetes management. A1c significantly elevated 13.6.  On Neurontin. Discussed with the case manager regarding discharging  with insulin.  # Chronic kidney disease stage III: Mild elevation in serum creatinine level today. Monitor BMP.  #Dyslipidemia. Patient has elevated LDL to 165. Started Crestor.  DVT prophylaxis: Heparin subcutaneous Code Status: Full code Family Communication: No family at bedside Disposition Plan: Currently admitted. PT OT evaluation    Consultants:   Cardiology  Procedures: None Antimicrobials: None  Subjective: Seen and examined at bedside. Shortness of breath is better. Denied chest pain, nausea and vomiting. Walking in the hallway with physical therapist. Objective: Vitals:   03/12/17 2022 03/12/17 2025 03/13/17 0517 03/13/17 0900  BP:   117/82 123/61  Pulse:   79 77  Resp:   20 18  Temp:   (!) 97.4 F (36.3 C) (!) 97.5 F (36.4 C)  TempSrc:   Oral Oral  SpO2: (!) 78% 92% 93% 97%  Weight:   82.6 kg (182 lb 1.6 oz) 82.6 kg (182 lb 1.6 oz)  Height:        Intake/Output Summary (Last 24 hours) at 03/13/17 1210 Last data filed at 03/13/17 0956  Gross per 24 hour  Intake              943 ml  Output             1420 ml  Net             -477 ml   Filed Weights   03/12/17 0640 03/13/17 0517 03/13/17 0900  Weight: 83 kg (182 lb 14.4 oz) 82.6 kg (182 lb 1.6 oz) 82.6 kg (182 lb 1.6 oz)    Examination:  General exam: Sitting on chair comfortably, not in distress Respiratory system: Clear bilaterally, respiratory effort normal Cardiovascular system: S1 & S2 heard, RRR. Bilateral  lower extremities pitting edema present but slowly improving. Gastrointestinal system: Abdomen is soft, nontender, nondistended. Bowel sound positive Central nervous system: Alert and oriented. No focal neurological deficits. Extremities: Symmetric 5 x 5 power. Skin: No rashes, lesions or ulcers Psychiatry: Judgement and insight appear normal. Mood & affect appropriate.     Data Reviewed: I have personally reviewed following labs and imaging studies  CBC:  Recent Labs Lab  03/10/17 1119 03/11/17 0616  WBC 7.1 9.2  HGB 14.5 15.1*  HCT 46.4* 47.5*  MCV 85.1 85.0  PLT 202 482   Basic Metabolic Panel:  Recent Labs Lab 03/10/17 1119 03/11/17 0616 03/12/17 0352 03/13/17 0318  NA 134* 137 138 137  K 5.1 4.0 3.9 4.1  CL 96* 95* 97* 99*  CO2 30 32 30 27  GLUCOSE 458* 195* 124* 181*  BUN 33* 34* 38* 52*  CREATININE 1.47* 1.39* 1.42* 1.58*  CALCIUM 9.6 9.4 9.2 9.2  MG  --  2.0  --   --   PHOS  --  4.3  --   --    GFR: Estimated Creatinine Clearance: 32.3 mL/min (A) (by C-G formula based on SCr of 1.58 mg/dL (H)). Liver Function Tests: No results for input(s): AST, ALT, ALKPHOS, BILITOT, PROT, ALBUMIN in the last 168 hours. No results for input(s): LIPASE, AMYLASE in the last 168 hours. No results for input(s): AMMONIA in the last 168 hours. Coagulation Profile: No results for input(s): INR, PROTIME in the last 168 hours. Cardiac Enzymes: No results for input(s): CKTOTAL, CKMB, CKMBINDEX, TROPONINI in the last 168 hours. BNP (last 3 results)  Recent Labs  07/21/16 1210 07/29/16 1159 09/05/16 1131  PROBNP 2,696* 3,611* 2,929*   HbA1C:  Recent Labs  03/11/17 0616  HGBA1C 13.6*   CBG:  Recent Labs Lab 03/12/17 1126 03/12/17 1612 03/12/17 2117 03/13/17 0742 03/13/17 1108  GLUCAP 254* 318* 281* 160* 229*   Lipid Profile:  Recent Labs  03/12/17 0352  CHOL 236*  HDL 32*  LDLCALC 165*  TRIG 194*  CHOLHDL 7.4   Thyroid Function Tests: No results for input(s): TSH, T4TOTAL, FREET4, T3FREE, THYROIDAB in the last 72 hours. Anemia Panel: No results for input(s): VITAMINB12, FOLATE, FERRITIN, TIBC, IRON, RETICCTPCT in the last 72 hours. Sepsis Labs:  Recent Labs Lab 03/10/17 1119  PROCALCITON <0.10    Recent Results (from the past 240 hour(s))  Respiratory Panel by PCR     Status: None   Collection Time: 03/10/17  5:58 PM  Result Value Ref Range Status   Adenovirus NOT DETECTED NOT DETECTED Final   Coronavirus 229E NOT  DETECTED NOT DETECTED Final   Coronavirus HKU1 NOT DETECTED NOT DETECTED Final   Coronavirus NL63 NOT DETECTED NOT DETECTED Final   Coronavirus OC43 NOT DETECTED NOT DETECTED Final   Metapneumovirus NOT DETECTED NOT DETECTED Final   Rhinovirus / Enterovirus NOT DETECTED NOT DETECTED Final   Influenza A NOT DETECTED NOT DETECTED Final   Influenza B NOT DETECTED NOT DETECTED Final   Parainfluenza Virus 1 NOT DETECTED NOT DETECTED Final   Parainfluenza Virus 2 NOT DETECTED NOT DETECTED Final   Parainfluenza Virus 3 NOT DETECTED NOT DETECTED Final   Parainfluenza Virus 4 NOT DETECTED NOT DETECTED Final   Respiratory Syncytial Virus NOT DETECTED NOT DETECTED Final   Bordetella pertussis NOT DETECTED NOT DETECTED Final   Chlamydophila pneumoniae NOT DETECTED NOT DETECTED Final   Mycoplasma pneumoniae NOT DETECTED NOT DETECTED Final         Radiology  Studies: No results found.      Scheduled Meds: . aspirin EC  81 mg Oral QPM  . gabapentin  300 mg Oral TID  . guaiFENesin  1,200 mg Oral BID  . heparin  5,000 Units Subcutaneous Q8H  . insulin aspart  0-20 Units Subcutaneous TID WC  . insulin aspart  0-5 Units Subcutaneous QHS  . insulin aspart  4 Units Subcutaneous TID WC  . insulin glargine  10 Units Subcutaneous BID  . Living Better with Heart Failure Book   Does not apply Once  . mouth rinse  15 mL Mouth Rinse BID  . mometasone-formoterol  2 puff Inhalation BID  . rosuvastatin  40 mg Oral q1800  . sertraline  150 mg Oral Daily  . sodium chloride flush  3 mL Intravenous Q12H  . spironolactone  25 mg Oral Daily   Continuous Infusions: . sodium chloride       LOS: 2 days    Eurydice Calixto Tanna Furry, MD [facility] Pager [phone]  If 7PM-7AM, please contact night-coverage [email or web address] Password [email or web address] 03/13/2017, 12:10 PM

## 2017-03-13 NOTE — Progress Notes (Signed)
Physical Therapy Treatment Patient Details Name: Amy Shepard MRN: [id number] DOB: 1945-09-06 Today's Date: 03/13/2017    History of Present Illness Pt is a 71 y.o. female with medical history significant of lung cancer, CHF, COPD, depression, diabetes, hyperlipidemia, hypertension, and spinal stenosis (s/p PLIF 2017). Patient presented with worsening shortness of breath and productive cough. Found to have Acute on chronic diastolic congestive heart failure and Acute on chronic respiratory failure with hypoxia    PT Comments    Patient is making progress toward mobility goals. Pt tolerated increased gait distance and stair negotiation with several rest breaks due to fatigue. SpO2 desat to 88% on 3L O2 via [place] with mobility. Current plan remains appropriate.    Follow Up Recommendations  Home health PT;Supervision - Intermittent     Equipment Recommendations   (rollator)    Recommendations for Other Services       Precautions / Restrictions Precautions Precautions: Fall Precaution Comments: watch O2 sats Restrictions Weight Bearing Restrictions: No    Mobility  Bed Mobility               General bed mobility comments: pt in bathroom with OT present upon arrival  Transfers Overall transfer level: Needs assistance Equipment used: None Transfers: Sit to/from Stand Sit to Stand: Supervision Stand pivot transfers: Supervision       General transfer comment: supervision for safety. No physical assist.  Ambulation/Gait Ambulation/Gait assistance: Min guard;Supervision Ambulation Distance (Feet): 200 Feet Assistive device: Rolling walker (2 wheeled) Gait Pattern/deviations: Step-through pattern;Decreased stride length Gait velocity: decreased   General Gait Details: several standing rest breaks and one seated break required due to fatigue; cues for posture and proximity of RW   Stairs Stairs: Yes   Stair Management: One rail Right;Step to  pattern;Forwards Number of Stairs: 10 General stair comments: cues for step to pattern; min guard for safety; standing rest break at landing and pt unable to ascend rest of flight due to fatigue; cues for pursed lip breathing; Spo2 down to 88% on 3L O2 via [place]   Wheelchair Mobility    Modified Rankin (Stroke Patients Only)       Balance Overall balance assessment: Needs assistance Sitting-balance support: No upper extremity supported;Feet supported Sitting balance-Leahy Scale: Good     Standing balance support: Single extremity supported;During functional activity Standing balance-Leahy Scale: Poor Standing balance comment: In room occassional UE A as she ambulated from recliner to bathroom, standing at sink on UE A while she washed hands, Bil UE A while ambulating in hallway with RW                            Cognition Arousal/Alertness: Awake/alert Behavior During Therapy: WFL for tasks assessed/performed Overall Cognitive Status: Within Functional Limits for tasks assessed                                        Exercises      General Comments        Pertinent Vitals/Pain Pain Assessment: Faces Faces Pain Scale: Hurts little more Pain Location: shoulders and bilat UE Pain Descriptors / Indicators: Sore Pain Intervention(s): Limited activity within patient's tolerance;Monitored during session;Repositioned    Home Living Family/patient expects to be discharged to:: Private residence Living Arrangements: Children;Other relatives Available Help at Discharge: Family;Available PRN/intermittently (dtr works and kids are in school) Type of Home:  House Home Access: Level entry   Home Layout: Two level;Bed/bath upstairs Home Equipment: Clinical cytogeneticist - 2 wheels;Bedside commode;Cane - single point      Prior Function Level of Independence: Independent with assistive device(s)      Comments: Uses shower seat for bathing. Uses SPC and RW in  house prn or "furniture walks" but definitely uses AD when out and about   PT Goals (current goals can now be found in the care plan section) Acute Rehab PT Goals Patient Stated Goal: to go home PT Goal Formulation: With patient Time For Goal Achievement: 03/26/17 Potential to Achieve Goals: Good Progress towards PT goals: Progressing toward goals    Frequency    Min 3X/week      PT Plan Current plan remains appropriate    Co-evaluation              AM-PAC PT "6 Clicks" Daily Activity  Outcome Measure  Difficulty turning over in bed (including adjusting bedclothes, sheets and blankets)?: None Difficulty moving from lying on back to sitting on the side of the bed? : None Difficulty sitting down on and standing up from a chair with arms (e.g., wheelchair, bedside commode, etc,.)?: None Help needed moving to and from a bed to chair (including a wheelchair)?: None Help needed walking in hospital room?: A Little Help needed climbing 3-5 steps with a railing? : A Little 6 Click Score: 22    End of Session Equipment Utilized During Treatment: Oxygen;Gait belt Activity Tolerance: Patient tolerated treatment well Patient left: in chair;with call bell/phone within reach Nurse Communication: Mobility status PT Visit Diagnosis: Difficulty in walking, not elsewhere classified (R26.2)     Time: 5093-2671 PT Time Calculation (min) (ACUTE ONLY): 25 min  Charges:    Gait training: 23-37                   G Codes:       Earney Navy, PTA Pager: [phone]     Darliss Cheney 03/13/2017, 11:03 AM

## 2017-03-13 NOTE — Progress Notes (Signed)
Taken via bed transport to vascular for LE doppler studies.

## 2017-03-13 NOTE — Plan of Care (Signed)
Problem: Respiratory: Goal: Levels of oxygenation will improve Outcome: Completed/Met Date Met: 03/13/17 Home 02 dependent

## 2017-03-14 DIAGNOSIS — I2781 Cor pulmonale (chronic): Secondary | ICD-10-CM

## 2017-03-14 DIAGNOSIS — Z794 Long term (current) use of insulin: Secondary | ICD-10-CM

## 2017-03-14 DIAGNOSIS — I272 Pulmonary hypertension, unspecified: Secondary | ICD-10-CM

## 2017-03-14 DIAGNOSIS — E118 Type 2 diabetes mellitus with unspecified complications: Secondary | ICD-10-CM

## 2017-03-14 LAB — BASIC METABOLIC PANEL
Anion gap: 8 (ref 5–15)
BUN: 42 mg/dL — AB (ref 6–20)
CALCIUM: 9.1 mg/dL (ref 8.9–10.3)
CO2: 33 mmol/L — ABNORMAL HIGH (ref 22–32)
CREATININE: 1.26 mg/dL — AB (ref 0.44–1.00)
Chloride: 99 mmol/L — ABNORMAL LOW (ref 101–111)
GFR calc Af Amer: 49 mL/min — ABNORMAL LOW (ref >60)
GFR, EST NON AFRICAN AMERICAN: 42 mL/min — AB (ref >60)
Glucose, Bld: 150 mg/dL — ABNORMAL HIGH (ref 65–99)
Potassium: 4.2 mmol/L (ref 3.5–5.1)
SODIUM: 140 mmol/L (ref 135–145)

## 2017-03-14 LAB — GLUCOSE, CAPILLARY
GLUCOSE-CAPILLARY: 137 mg/dL — AB (ref 65–99)
GLUCOSE-CAPILLARY: 269 mg/dL — AB (ref 65–99)
GLUCOSE-CAPILLARY: 231 mg/dL — AB (ref 65–99)
Glucose-Capillary: 186 mg/dL — ABNORMAL HIGH (ref 65–99)

## 2017-03-14 MED ORDER — FUROSEMIDE 40 MG PO TABS
40.0000 mg | ORAL_TABLET | Freq: Every day | ORAL | Status: DC
  Administered 2017-03-14 – 2017-03-15 (×2): 40 mg via ORAL
  Filled 2017-03-14 (×2): qty 1

## 2017-03-14 NOTE — Progress Notes (Signed)
Progress Note  Patient Name: Amy Shepard Date of Encounter: 03/14/2017  Primary Cardiologist: [name]  Subjective   Breathing better.  No chest pain. Feels better today  Inpatient Medications    Scheduled Meds: . aspirin EC  81 mg Oral QPM  . gabapentin  300 mg Oral TID  . guaiFENesin  1,200 mg Oral BID  . heparin  5,000 Units Subcutaneous Q8H  . insulin aspart  0-20 Units Subcutaneous TID WC  . insulin aspart  0-5 Units Subcutaneous QHS  . insulin aspart  4 Units Subcutaneous TID WC  . insulin glargine  15 Units Subcutaneous BID  . mouth rinse  15 mL Mouth Rinse BID  . mometasone-formoterol  2 puff Inhalation BID  . rosuvastatin  40 mg Oral q1800  . sertraline  150 mg Oral Daily  . sodium chloride flush  3 mL Intravenous Q12H  . spironolactone  25 mg Oral Daily   Continuous Infusions: . sodium chloride     PRN Meds: sodium chloride, acetaminophen **OR** acetaminophen, diphenhydrAMINE, ipratropium-albuterol, LORazepam, ondansetron **OR** ondansetron (ZOFRAN) IV, sodium chloride flush   Vital Signs    Vitals:   03/13/17 2126 03/13/17 2221 03/14/17 0454 03/14/17 0839  BP: (!) 110/50 (!) 94/45 (!) 100/50   Pulse: 89 82 80   Resp: 18  18   Temp: 98.1 F (36.7 C) 98.1 F (36.7 C) 98.3 F (36.8 C)   TempSrc: Oral Oral Oral   SpO2: 91% 94% 94% 94%  Weight:   182 lb 14.4 oz (83 kg)   Height:        Intake/Output Summary (Last 24 hours) at 03/14/17 1049 Last data filed at 03/14/17 0912  Gross per 24 hour  Intake              960 ml  Output             1200 ml  Net             -240 ml   I/O's since admission: -3527  Filed Weights   03/13/17 0517 03/13/17 0900 03/14/17 0454  Weight: 182 lb 1.6 oz (82.6 kg) 182 lb 1.6 oz (82.6 kg) 182 lb 14.4 oz (83 kg)    Telemetry    NSR - Personally Reviewed  Physical Exam   BP (!) 100/50 (BP Location: Right Arm)   Pulse 80   Temp 98.3 F (36.8 C) (Oral)   Resp 18   Ht _0  (1.549 m)   Wt 182 lb 14.4  oz (83 kg)   SpO2 94%   BMI 34.56 kg/m  General: Alert, oriented, no distress.  Skin: normal turgor, no rashes, warm and dry HEENT: Normocephalic, atraumatic. Pupils equal round and reactive to light; sclera anicteric; extraocular muscles intact; Nose without nasal septal hypertrophy Mouth/Parynx benign; Mallinpatti scale 3 Neck: No JVD, no carotid bruits; normal carotid upstroke Lungs: Decreased breath sounds.; no wheezing or rales Chest wall: without tenderness to palpitation Heart: PMI not displaced, RRR, s1 s2 normal, 1/6 systolic murmur, no diastolic murmur, no rubs, gallops, thrills, or heaves Abdomen: soft, nontender; no hepatosplenomehaly, BS+; abdominal aorta nontender and not dilated by palpation. Back: no CVA tenderness Pulses 2+ Musculoskeletal: full range of motion, normal strength, no joint deformities Extremities: Mild edema with mild erythema, improved;  no clubbing cyanosis , Homan's sign negative  Neurologic: grossly nonfocal; Cranial nerves grossly wnl Psychologic: Normal mood and affect   Labs    Chemistry  Recent Labs Lab 03/12/17 0352 03/13/17  7681 03/14/17 0631  NA 138 137 140  K 3.9 4.1 4.2  CL 97* 99* 99*  CO2 30 27 33*  GLUCOSE 124* 181* 150*  BUN 38* 52* 42*  CREATININE 1.42* 1.58* 1.26*  CALCIUM 9.2 9.2 9.1  GFRNONAA 36* 32* 42*  GFRAA 42* 37* 49*  ANIONGAP _0 Hematology  Recent Labs Lab 03/10/17 1119 03/11/17 0616  WBC 7.1 9.2  RBC 5.45* 5.59*  HGB 14.5 15.1*  HCT 46.4* 47.5*  MCV 85.1 85.0  MCH 26.6 27.0  MCHC 31.3 31.8  RDW 16.2* 15.7*  PLT 202 207    Cardiac EnzymesNo results for input(s): TROPONINI in the last 168 hours.   Recent Labs Lab 03/10/17 1156  TROPIPOC 0.01     BNP  Recent Labs Lab 03/10/17 1119  BNP 914.5*     DDimer   Recent Labs Lab 03/12/17 1450  DDIMER 1.11*    Lipid Panel     Component Value Date/Time   CHOL 236 (H) 03/12/2017 0352   TRIG 194 (H) 03/12/2017 0352   HDL 32  (L) 03/12/2017 0352   CHOLHDL 7.4 03/12/2017 0352   VLDL 39 03/12/2017 0352   LDLCALC 165 (H) 03/12/2017 0352    Radiology    Dg Chest 2 View  Result Date: 03/13/2017 CLINICAL DATA:  Shortness of breath EXAM: CHEST  2 VIEW COMPARISON:  03/10/2017 FINDINGS: Mild cardiomegaly with central vascular congestion. No focal consolidation or effusion. Aortic atherosclerosis. No pneumothorax. IMPRESSION: 1. Cardiomegaly with central vascular congestion. Negative for edema or infiltrate Electronically Signed   By: Donavan Foil M.D.   On: 03/13/2017 21:47   Nm Pulmonary Perf And Vent  Result Date: 03/13/2017 CLINICAL DATA:  Shortness of breath EXAM: NUCLEAR MEDICINE VENTILATION - PERFUSION LUNG SCAN TECHNIQUE: Ventilation images were obtained in multiple projections using inhaled aerosol Tc-67mDTPA. Perfusion images were obtained in multiple projections after intravenous injection of Tc-981mAA. RADIOPHARMACEUTICALS:  32 mCi Technetium-9954mPA aerosol inhalation and 4.15 mCi Technetium-34m36m IV COMPARISON:  Chest x-ray 03/13/2017 FINDINGS: Ventilation: Heterogenous ventilation. Some central airway clumping on the left. Small to moderate ingested activity. Perfusion: No wedge shaped peripheral perfusion defects to suggest acute pulmonary embolism. IMPRESSION: Negative examination. Perfusion is within normal limits. Heterogenous ventilation suggesting obstructive lung disease. Electronically Signed   By: Kim Donavan Foil.   On: 03/13/2017 21:46    Cardiac Studies   2D Echo 03/11/17 Study Conclusions  - Left ventricle: The cavity size was normal. Systolic function was   vigorous. The estimated ejection fraction was in the range of 65%   to 70%. Wall motion was normal; there were no regional wall   motion abnormalities. Doppler parameters are consistent with   abnormal left ventricular relaxation (grade 1 diastolic   dysfunction). - Ventricular septum: The contour showed diastolic flattening and    systolic flattening. - Aortic valve: Mildly to moderately calcified annulus. - Right ventricle: The cavity size was severely dilated. Systolic   function was reduced. - Right atrium: The atrium was moderately to severely dilated. - Tricuspid valve: There was mild-moderate regurgitation. - Pulmonary arteries: Systolic pressure was moderately to severely   increased. PA peak pressure: 67 mm Hg (S).   ------------------------------------------------------------------- Lower extremity venous Doppler Summary:  - No evidence of deep vein or superficial thrombosis involving the   right lower extremity and left lower extremity. - No evidence of Baker&'s cyst on the right or left.  Other specific details can be  found in the table(s) above. Prepared and Electronically Authenticated by  Patient Profile     71 y.o. female with past medical history of CAD (nonobstructive by cath in 06/2014 with low-risk NST in 01/2016), chronic diastolic CHF, COPD (on 7.0R [place] at night), lung cancer (in remission), CKD stage III (baseline 1.3-1.4) Type 2 DM, HTN, and HLD who presented with acute CHF.    Assessment & Plan    1. Acute on Chronic Diastolic CHF, severely dilated RV with reduced RV function, mild-mod TR and pulmonary HTN with PASP 80mHg - diuresed from 195 down to 182lb. She reports dry weight in the low 180s. She continues to have some edema on exam. I suspect primary component at this point of right heart failure which could be related to her advanced COPD and prior noncompliance with nocturnal O2. D-dimer was checked yesterday and was elevated. Repeat VQ scan yesterday shows normal perfusion with heterogeneous ventilation consistent with COPD.  Lower extremity duplex study is negative for DVT.  Suspect cor pulmonale with chronic RV dilation and decreased function with significant pulmonary hypertension. Will f/u BNP in am.  2. Nonobstructive CAD - troponin negative. Continue ASA.  3. HTN - BP  controlled, soft yesterday but stable today. Follow.  4. Stage 3 CKD - creatinine improved today to 1.26  5. COPD - on 2.5L nasal cannula at night. Encouraged by Pulmonology to use 24/7 but she reports being unable to do so due to financial limitations. Per admitting team.   6. HL: LDL above goal at 165 - Crestor 40 mg added. If the patient is tolerating statin at time of follow-up appointment, re-check liver function/lipid panel in 6-8 weeks.   Try to ambulate today; hopeful DC in am if stable.  Signed, TShelva Majestic MD  03/14/2017, 10:49 AM

## 2017-03-14 NOTE — Progress Notes (Signed)
PROGRESS NOTE    Amy Shepard  YNW:[id number] DOB: 1946-03-05 DOA: 03/10/2017 PCP: Maurice Small, MD   Brief Narrative: 71 y.o. female with medical history significant of lung cancer, CHF, COPD on home o2 @ 2.5 L, depression, diabetes, hyperlipidemia, hypertension. Patient presents with worsening shortness of breath and productive cough for approximately 2 weeks.  Assessment & Plan:  # Acute on chronic diastolic congestive heart failure: Patient had elevated BNP, significant weight gain and x-ray consistent with mild pulmonary edema consistent with CHF.Repeat echo with EF of 65-70% severely dilated right ventricle. Patient likely has cor pulmonale. Mild elevation in d-dimer and VQ scan and Doppler lower extremities unremarkable for clot. Cardiology consult appreciated. Patient feels that her shortness of breath is better today. -Negative by 3.5 L. -Planned to repeat BNP tomorrow by cardiology. Serum creatinine level trended down today therefore I'll resume oral Lasix today.  #Acute on chronic respiratory failure with hypoxia: Patient uses about 2.5 L of oxygen at home. She required 4-6 L of oxygen on admission. Now improving to 2 L. Continue current treatment and oxygen therapy.  #History of COPD with chronic respiratory failure: Continue diuretics. Respiratory panel negative.  Continue current bronchodilators.  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 Case manager evaluation ongoing.  -Blood sugar is better controlled now. Continue current insulin regimen. Patient was educated on diabetes management. A1c significantly elevated 13.6.  On Neurontin. Discussed with the case manager regarding discharging with insulin.  # Chronic kidney disease stage III: Improvement in serum creatinine level today. Monitor BMP.[name]  #Dyslipidemia. Patient has elevated LDL to 165. Started Crestor.  DVT prophylaxis:  Heparin subcutaneous Code Status: Full code Family Communication: No family at bedside Disposition Plan: Currently admitted. PT OT evaluation    Consultants:   Cardiology  Procedures: None Antimicrobials: None  Subjective: Seen and examined at bedside. Shortness of breath better. Denied chest pain, nausea or vomiting Objective: Vitals:   03/13/17 2126 03/13/17 2221 03/14/17 0454 03/14/17 0839  BP: (!) 110/50 (!) 94/45 (!) 100/50   Pulse: 89 82 80   Resp: 18  18   Temp: 98.1 F (36.7 C) 98.1 F (36.7 C) 98.3 F (36.8 C)   TempSrc: Oral Oral Oral   SpO2: 91% 94% 94% 94%  Weight:   83 kg (182 lb 14.4 oz)   Height:        Intake/Output Summary (Last 24 hours) at 03/14/17 1129 Last data filed at 03/14/17 0912  Gross per 24 hour  Intake              960 ml  Output             1200 ml  Net             -240 ml   Filed Weights   03/13/17 0517 03/13/17 0900 03/14/17 0454  Weight: 82.6 kg (182 lb 1.6 oz) 82.6 kg (182 lb 1.6 oz) 83 kg (182 lb 14.4 oz)    Examination:  General exam: Not in distress Respiratory system: Clear bilateral, respiratory effort normal Cardiovascular system: Regular rate rhythm S1-S2 normal. Lower extremities pitting edema improving. Gastrointestinal system: Abdomen is soft, nontender, nondistended. Bowel sound positive Central nervous system: Alert and oriented. No focal neurological deficits. Extremities: Symmetric 5 x 5 power. Skin: No rashes, lesions or ulcers Psychiatry: Judgement and insight appear normal. Mood & affect appropriate.     Data Reviewed: I have personally reviewed  following labs and imaging studies  CBC:  Recent Labs Lab 03/10/17 1119 03/11/17 0616  WBC 7.1 9.2  HGB 14.5 15.1*  HCT 46.4* 47.5*  MCV 85.1 85.0  PLT 202 016   Basic Metabolic Panel:  Recent Labs Lab 03/10/17 1119 03/11/17 0616 03/12/17 0352 03/13/17 0318 03/14/17 0631  NA 134* 137 138 137 140  K 5.1 4.0 3.9 4.1 4.2  CL 96* 95* 97* 99* 99*    CO2 30 32 30 27 33*  GLUCOSE 458* 195* 124* 181* 150*  BUN 33* 34* 38* 52* 42*  CREATININE 1.47* 1.39* 1.42* 1.58* 1.26*  CALCIUM 9.6 9.4 9.2 9.2 9.1  MG  --  2.0  --   --   --   PHOS  --  4.3  --   --   --    GFR: Estimated Creatinine Clearance: 40.6 mL/min (A) (by C-G formula based on SCr of 1.26 mg/dL (H)). Liver Function Tests: No results for input(s): AST, ALT, ALKPHOS, BILITOT, PROT, ALBUMIN in the last 168 hours. No results for input(s): LIPASE, AMYLASE in the last 168 hours. No results for input(s): AMMONIA in the last 168 hours. Coagulation Profile: No results for input(s): INR, PROTIME in the last 168 hours. Cardiac Enzymes: No results for input(s): CKTOTAL, CKMB, CKMBINDEX, TROPONINI in the last 168 hours. BNP (last 3 results)  Recent Labs  07/21/16 1210 07/29/16 1159 09/05/16 1131  PROBNP 2,696* 3,611* 2,929*   HbA1C: No results for input(s): HGBA1C in the last 72 hours. CBG:  Recent Labs Lab 03/13/17 0742 03/13/17 1108 03/13/17 1219 03/13/17 1621 03/14/17 0729  GLUCAP 160* 229* 201* 157* 137*   Lipid Profile:  Recent Labs  03/12/17 0352  CHOL 236*  HDL 32*  LDLCALC 165*  TRIG 194*  CHOLHDL 7.4   Thyroid Function Tests: No results for input(s): TSH, T4TOTAL, FREET4, T3FREE, THYROIDAB in the last 72 hours. Anemia Panel: No results for input(s): VITAMINB12, FOLATE, FERRITIN, TIBC, IRON, RETICCTPCT in the last 72 hours. Sepsis Labs:  Recent Labs Lab 03/10/17 1119  PROCALCITON <0.10    Recent Results (from the past 240 hour(s))  Respiratory Panel by PCR     Status: None   Collection Time: 03/10/17  5:58 PM  Result Value Ref Range Status   Adenovirus NOT DETECTED NOT DETECTED Final   Coronavirus 229E NOT DETECTED NOT DETECTED Final   Coronavirus HKU1 NOT DETECTED NOT DETECTED Final   Coronavirus NL63 NOT DETECTED NOT DETECTED Final   Coronavirus OC43 NOT DETECTED NOT DETECTED Final   Metapneumovirus NOT DETECTED NOT DETECTED Final    Rhinovirus / Enterovirus NOT DETECTED NOT DETECTED Final   Influenza A NOT DETECTED NOT DETECTED Final   Influenza B NOT DETECTED NOT DETECTED Final   Parainfluenza Virus 1 NOT DETECTED NOT DETECTED Final   Parainfluenza Virus 2 NOT DETECTED NOT DETECTED Final   Parainfluenza Virus 3 NOT DETECTED NOT DETECTED Final   Parainfluenza Virus 4 NOT DETECTED NOT DETECTED Final   Respiratory Syncytial Virus NOT DETECTED NOT DETECTED Final   Bordetella pertussis NOT DETECTED NOT DETECTED Final   Chlamydophila pneumoniae NOT DETECTED NOT DETECTED Final   Mycoplasma pneumoniae NOT DETECTED NOT DETECTED Final         Radiology Studies: Dg Chest 2 View  Result Date: 03/13/2017 CLINICAL DATA:  Shortness of breath EXAM: CHEST  2 VIEW COMPARISON:  03/10/2017 FINDINGS: Mild cardiomegaly with central vascular congestion. No focal consolidation or effusion. Aortic atherosclerosis. No pneumothorax. IMPRESSION: 1. Cardiomegaly with central  vascular congestion. Negative for edema or infiltrate Electronically Signed   By: Donavan Foil M.D.   On: 03/13/2017 21:47   Nm Pulmonary Perf And Vent  Result Date: 03/13/2017 CLINICAL DATA:  Shortness of breath EXAM: NUCLEAR MEDICINE VENTILATION - PERFUSION LUNG SCAN TECHNIQUE: Ventilation images were obtained in multiple projections using inhaled aerosol Tc-96mDTPA. Perfusion images were obtained in multiple projections after intravenous injection of Tc-978mAA. RADIOPHARMACEUTICALS:  32 mCi Technetium-9963mPA aerosol inhalation and 4.15 mCi Technetium-89m55m IV COMPARISON:  Chest x-ray 03/13/2017 FINDINGS: Ventilation: Heterogenous ventilation. Some central airway clumping on the left. Small to moderate ingested activity. Perfusion: No wedge shaped peripheral perfusion defects to suggest acute pulmonary embolism. IMPRESSION: Negative examination. Perfusion is within normal limits. Heterogenous ventilation suggesting obstructive lung disease. Electronically Signed   By:  Kim Donavan Foil.   On: 03/13/2017 21:46        Scheduled Meds: . aspirin EC  81 mg Oral QPM  . gabapentin  300 mg Oral TID  . guaiFENesin  1,200 mg Oral BID  . heparin  5,000 Units Subcutaneous Q8H  . insulin aspart  0-20 Units Subcutaneous TID WC  . insulin aspart  0-5 Units Subcutaneous QHS  . insulin aspart  4 Units Subcutaneous TID WC  . insulin glargine  15 Units Subcutaneous BID  . mouth rinse  15 mL Mouth Rinse BID  . mometasone-formoterol  2 puff Inhalation BID  . rosuvastatin  40 mg Oral q1800  . sertraline  150 mg Oral Daily  . sodium chloride flush  3 mL Intravenous Q12H  . spironolactone  25 mg Oral Daily   Continuous Infusions: . sodium chloride       LOS: 3 days    Amy Shepard PrasTanna Furry [facility] Pager [phone] 7PM-7AM, please contact night-coverage [email or web address] Password [email or web address] 03/14/2017, 11:29 AM

## 2017-03-15 LAB — GLUCOSE, CAPILLARY: Glucose-Capillary: 188 mg/dL — ABNORMAL HIGH (ref 65–99)

## 2017-03-15 LAB — BRAIN NATRIURETIC PEPTIDE: B Natriuretic Peptide: 444.1 pg/mL — ABNORMAL HIGH (ref 0.0–100.0)

## 2017-03-15 MED ORDER — INSULIN PEN NEEDLE 30G X 8 MM MISC: 1.0000 | 0 refills | Status: DC | PRN

## 2017-03-15 MED ORDER — FUROSEMIDE 40 MG PO TABS: 40.0000 mg | ORAL_TABLET | Freq: Every day | ORAL | 0 refills | Status: DC

## 2017-03-15 MED ORDER — BLOOD GLUCOSE MONITOR KIT: PACK | 0 refills | Status: DC

## 2017-03-15 MED ORDER — ROSUVASTATIN CALCIUM 40 MG PO TABS: 40.0000 mg | ORAL_TABLET | Freq: Every day | ORAL | 0 refills | Status: DC

## 2017-03-15 MED ORDER — INSULIN GLARGINE 100 UNIT/ML ~~LOC~~ SOLN: 20.0000 [IU] | Freq: Every day | SUBCUTANEOUS | 1 refills | Status: DC

## 2017-03-15 MED ORDER — SPIRONOLACTONE 25 MG PO TABS: 25.0000 mg | ORAL_TABLET | Freq: Every day | ORAL | 0 refills | Status: DC

## 2017-03-15 NOTE — Progress Notes (Signed)
Patient has refused home health.

## 2017-03-15 NOTE — Progress Notes (Addendum)
Pt has orders to be discharged. Discharge instructions given and pt has no additional questions at this time. Medication regimen reviewed and pt educated. Pt verbalized understanding and has no additional questions. Telemetry box removed. IV removed and site in good condition. Pt stable and waiting for transportation.  Patient states she is missing black pants. Pants not found in room. Emergency department notified. Unable to find. Patient given paper bottoms to wear.

## 2017-03-15 NOTE — Progress Notes (Signed)
Progress Note  Patient Name: Amy Shepard Date of Encounter: 03/15/2017  Primary Cardiologist: [name]  Subjective   Feels well.  Breathing better.  No chest pain or shortness of breath.  Anxious to go home today.  Inpatient Medications    Scheduled Meds: . aspirin EC  81 mg Oral QPM  . furosemide  40 mg Oral Daily  . gabapentin  300 mg Oral TID  . guaiFENesin  1,200 mg Oral BID  . heparin  5,000 Units Subcutaneous Q8H  . insulin aspart  0-20 Units Subcutaneous TID WC  . insulin aspart  0-5 Units Subcutaneous QHS  . insulin aspart  4 Units Subcutaneous TID WC  . insulin glargine  15 Units Subcutaneous BID  . mouth rinse  15 mL Mouth Rinse BID  . mometasone-formoterol  2 puff Inhalation BID  . rosuvastatin  40 mg Oral q1800  . sertraline  150 mg Oral Daily  . sodium chloride flush  3 mL Intravenous Q12H  . spironolactone  25 mg Oral Daily   Continuous Infusions: . sodium chloride     PRN Meds: sodium chloride, acetaminophen **OR** acetaminophen, diphenhydrAMINE, ipratropium-albuterol, LORazepam, ondansetron **OR** ondansetron (ZOFRAN) IV, sodium chloride flush   Vital Signs    Vitals:   03/14/17 1208 03/14/17 2042 03/15/17 0640 03/15/17 0801  BP: (!) 105/51 (!) 105/50 (!) 110/45   Pulse: 84 75 73   Resp: _0 Temp: 97.8 F (36.6 C) 98.1 F (36.7 C) (!) 97.5 F (36.4 C)   TempSrc: Oral Oral Oral   SpO2: 90% 93% 99% 99%  Weight:   182 lb 11.2 oz (82.9 kg)   Height:        Intake/Output Summary (Last 24 hours) at 03/15/17 1027 Last data filed at 03/15/17 0857  Gross per 24 hour  Intake              840 ml  Output             2300 ml  Net            -1460 ml   I/O's since admission: -4987;  Weight today 182  Filed Weights   03/13/17 0900 03/14/17 0454 03/15/17 0640  Weight: 182 lb 1.6 oz (82.6 kg) 182 lb 14.4 oz (83 kg) 182 lb 11.2 oz (82.9 kg)    Telemetry    NSR - Personally Reviewed  Physical Exam   BP (!) 110/45 (BP Location:  Left Arm)   Pulse 73   Temp (!) 97.5 F (36.4 C) (Oral)   Resp 20   Ht _1  (1.549 m)   Wt 182 lb 11.2 oz (82.9 kg)   SpO2 99%   BMI 34.52 kg/m  General: Alert, oriented, no distress.  Skin: normal turgor, no rashes, warm and dry HEENT: Normocephalic, atraumatic. Pupils equal round and reactive to light; sclera anicteric; extraocular muscles intact;  Nose without nasal septal hypertrophy Mouth/Parynx benign; Mallinpatti scale 3 Neck: No JVD, no carotid bruits; normal carotid upstroke Lungs: Decreased breath sounds without wheezing or ralesChest wall: without tenderness to palpitation Heart: PMI not displaced, RRR, s1 s2 normal, 1/6 systolic murmur, no diastolic murmur, no rubs, gallops, thrills, or heaves Abdomen: soft, nontender; no hepatosplenomehaly, BS+; abdominal aorta nontender and not dilated by palpation. Back: no CVA tenderness Pulses 2+ Musculoskeletal: full range of motion, normal strength, no joint deformities Extremities: Edema, essentially  resolved.  Resolution of prior erythema;  no clubbing cyanosis, Homan's sign negative  Neurologic:  grossly nonfocal; Cranial nerves grossly wnl Psychologic: Normal mood and affect   Labs    Chemistry  Recent Labs Lab 03/12/17 0352 03/13/17 0318 03/14/17 0631  NA 138 137 140  K 3.9 4.1 4.2  CL 97* 99* 99*  CO2 30 27 33*  GLUCOSE 124* 181* 150*  BUN 38* 52* 42*  CREATININE 1.42* 1.58* 1.26*  CALCIUM 9.2 9.2 9.1  GFRNONAA 36* 32* 42*  GFRAA 42* 37* 49*  ANIONGAP _0 Hematology  Recent Labs Lab 03/10/17 1119 03/11/17 0616  WBC 7.1 9.2  RBC 5.45* 5.59*  HGB 14.5 15.1*  HCT 46.4* 47.5*  MCV 85.1 85.0  MCH 26.6 27.0  MCHC 31.3 31.8  RDW 16.2* 15.7*  PLT 202 207    Cardiac EnzymesNo results for input(s): TROPONINI in the last 168 hours.   Recent Labs Lab 03/10/17 1156  TROPIPOC 0.01     BNP  Recent Labs Lab 03/10/17 1119 03/15/17 0519  BNP 914.5* 444.1*     DDimer   Recent  Labs Lab 03/12/17 1450  DDIMER 1.11*    Lipid Panel     Component Value Date/Time   CHOL 236 (H) 03/12/2017 0352   TRIG 194 (H) 03/12/2017 0352   HDL 32 (L) 03/12/2017 0352   CHOLHDL 7.4 03/12/2017 0352   VLDL 39 03/12/2017 0352   LDLCALC 165 (H) 03/12/2017 0352    Radiology    Dg Chest 2 View  Result Date: 03/13/2017 CLINICAL DATA:  Shortness of breath EXAM: CHEST  2 VIEW COMPARISON:  03/10/2017 FINDINGS: Mild cardiomegaly with central vascular congestion. No focal consolidation or effusion. Aortic atherosclerosis. No pneumothorax. IMPRESSION: 1. Cardiomegaly with central vascular congestion. Negative for edema or infiltrate Electronically Signed   By: Donavan Foil M.D.   On: 03/13/2017 21:47   Nm Pulmonary Perf And Vent  Result Date: 03/13/2017 CLINICAL DATA:  Shortness of breath EXAM: NUCLEAR MEDICINE VENTILATION - PERFUSION LUNG SCAN TECHNIQUE: Ventilation images were obtained in multiple projections using inhaled aerosol Tc-72mDTPA. Perfusion images were obtained in multiple projections after intravenous injection of Tc-933mAA. RADIOPHARMACEUTICALS:  32 mCi Technetium-9912mPA aerosol inhalation and 4.15 mCi Technetium-37m56m IV COMPARISON:  Chest x-ray 03/13/2017 FINDINGS: Ventilation: Heterogenous ventilation. Some central airway clumping on the left. Small to moderate ingested activity. Perfusion: No wedge shaped peripheral perfusion defects to suggest acute pulmonary embolism. IMPRESSION: Negative examination. Perfusion is within normal limits. Heterogenous ventilation suggesting obstructive lung disease. Electronically Signed   By: Kim Donavan Foil.   On: 03/13/2017 21:46    Cardiac Studies   2D Echo 03/11/17 Study Conclusions  - Left ventricle: The cavity size was normal. Systolic function was   vigorous. The estimated ejection fraction was in the range of 65%   to 70%. Wall motion was normal; there were no regional wall   motion abnormalities. Doppler parameters  are consistent with   abnormal left ventricular relaxation (grade 1 diastolic   dysfunction). - Ventricular septum: The contour showed diastolic flattening and   systolic flattening. - Aortic valve: Mildly to moderately calcified annulus. - Right ventricle: The cavity size was severely dilated. Systolic   function was reduced. - Right atrium: The atrium was moderately to severely dilated. - Tricuspid valve: There was mild-moderate regurgitation. - Pulmonary arteries: Systolic pressure was moderately to severely   increased. PA peak pressure: 67 mm Hg (S).   ------------------------------------------------------------------- Lower extremity venous Doppler Summary:  - No evidence of deep vein or superficial thrombosis  involving the   right lower extremity and left lower extremity. - No evidence of Baker&'s cyst on the right or left.  Other specific details can be found in the table(s) above. Prepared and Electronically Authenticated by  Patient Profile     71 y.o. female with past medical history of CAD (nonobstructive by cath in 06/2014 with low-risk NST in 01/2016), chronic diastolic CHF, COPD (on 8.7O [place] at night), lung cancer (in remission), CKD stage III (baseline 1.3-1.4) Type 2 DM, HTN, and HLD who presented with acute CHF.    Assessment & Plan    1. Acute on Chronic Diastolic CHF, severely dilated RV with reduced RV function, mild-mod TR and pulmonary HTN with PASP 65mHg - diuresed from 195 down to 182lb..  Edema is now essentially resolved.  I suspect primary component at this point of right heart failure which could be related to her advanced COPD and prior noncompliance with nocturnal O2. D-dimer  was mildly elevated. Repeat VQ scan shows normal perfusion with heterogeneous ventilation consistent with COPD.  Lower extremity duplex study is negative for DVT.  Suspect cor pulmonale with chronic RV dilation and decreased function with significant pulmonary hypertension.   Follow-up BNP today is improved at 444.   2. Nonobstructive CAD - troponin negative. Continue ASA.  3. HTN - now controlled at 110/50  4. Stage 3 CKD - creatinine improved  to 1.26  5. COPD - on 2.5L nasal cannula at night. Encouraged by Pulmonology to use 24/7 but she reports being unable to do so due to financial limitations.  6. HL: LDL 165 - Crestor 40 mg added. If the patient is tolerating statin at time of follow-up appointment, re-check liver function/lipid panel in 6-8 weeks.   Stable from a cardiac standpoint for discharge today.  Will sign off.  The patient should have follow-up cardiology appointment with Dr. KEna Dawley  Signed, TShelva Majestic MD  03/15/2017, 10:27 AM

## 2017-03-15 NOTE — Discharge Summary (Addendum)
Physician Discharge Summary  Amy Shepard AJO:[id number] DOB: September 28, 1945 DOA: 03/10/2017  PCP: Maurice Small, MD  Admit date: 03/10/2017 Discharge date: 03/15/2017  Admitted From:Home Disposition: Home  Recommendations for Outpatient Follow-up:  1. Follow up with PCP in 1-2 weeks 2. Please obtain BMP/CBC in one week   Home Health:yes Equipment/Devices:resume oxygen Discharge Condition:stable CODE STATUS:full code Diet recommendation:carb modified heart healthy diet  Brief/Interim Summary: 71 y.o.femalewith medical history significant of lung cancer, CHF, COPD on home o2 @ 2.5 L, depression, diabetes, hyperlipidemia, hypertension. Patient presents with worsening shortness of breath and productive cough for approximately 2 weeks.  # Acute on chronic diastolic congestive heart failure: Patient had elevated BNP, significant weight gain and x-ray consistent with mild pulmonary edema consistent with CHFOn admission. echo with EF of 65-70% severely dilated right ventricle. Patient likely has cor pulmonale. Mild elevation in d-dimer and therefore ordered a VQ scan and Doppler lower extremities. Cardiology consult appreciated.  -Patient was treated with IV Lasix with improvement in symptoms. Denied shortness of breath or chest pain. Lower extremities edema improved. Discharging with oral Lasix. Patient was educated to continue oral Lasix and low salt diet. She verbalized understanding. I advised her to continue to take the medication at home. She will follow-up with her cardiologist.  #Acute on chronic respiratory failure with hypoxia: Patient uses about 2 L of oxygen. Improved to baseline.  #History of COPD with chronic respiratory failure: Respiratory panel negative.  Continue current bronchodilators.  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 -Patient with elevated A1c level. Education provided to  the patient regarding blood sugar monitoring and importance of taking insulin. He started on insulin. Patient verbalized understanding. Diabetic supplies prescription provided. Case manager evaluated the patient for arranging medications. I recommended patient to check blood sugar level and follow-up with PCP.  # Chronic kidney disease stage III: Serum creatinine level is stable. Continue to monitor BMP.  #Dyslipidemia. Patient has elevated LDL to 165. Started Crestor. Recommended to check liver function test in 4-6 weeks.  Patient is clinically stable. Lower extremity edema improved. On 2 L of oxygen. Feels better. Home care services ordered on discharge. Recommended to take insulin. At this time patient is medically stable to transfer her care to outpatient.  Discharge Diagnoses:  Active Problems:   Type 2 diabetes mellitus with complication, with long-term current use of insulin ([name])   Acute on chronic diastolic CHF (congestive heart failure) ([name])   HTN (hypertension)   Acute on chronic respiratory failure ([name])   CKD (chronic kidney disease), stage III   Chronic pain    Discharge Instructions  Discharge Instructions    (HEART FAILURE PATIENTS) Call MD:  Anytime you have any of the following symptoms: 1) 3 pound weight gain in 24 hours or 5 pounds in 1 week 2) shortness of breath, with or without a dry hacking cough 3) swelling in the hands, feet or stomach 4) if you have to sleep on extra pillows at night in order to breathe.    Complete by:  As directed    Call MD for:  difficulty breathing, headache or visual disturbances    Complete by:  As directed    Call MD for:  extreme fatigue    Complete by:  As directed    Call MD for:  hives    Complete by:  As directed    Call MD for:  persistant dizziness or light-headedness  Complete by:  As directed    Call MD for:  persistant nausea and vomiting    Complete by:  As directed    Call MD for:  severe uncontrolled pain     Complete by:  As directed    Call MD for:  temperature >100.4    Complete by:  As directed    Diet - low sodium heart healthy    Complete by:  As directed    Diet Carb Modified    Complete by:  As directed    Increase activity slowly    Complete by:  As directed      Allergies as of 03/15/2017      Reactions   Amoxicillin Anaphylaxis, Hives, Rash   Has patient had a PCN reaction causing immediate rash, facial/tongue/throat swelling, SOB or lightheadedness with hypotension: YES Positive reaction causing SEVERE RASH INVOLVING MUCUS MEMBRANES/SKIN NECROSIS: YES Reaction that required HOSPITALIZATION: YES Reaction occurring within the last 10 years: NO      Medication List    STOP taking these medications   GLIPIZIDE XL 5 MG 24 hr tablet Generic drug:  glipiZIDE   predniSONE 10 MG tablet Commonly known as:  DELTASONE     TAKE these medications   aspirin EC 81 MG tablet Take 81 mg by mouth every evening.   blood glucose meter kit and supplies Kit Dispense based on patient and insurance preference. Use up to four times daily as directed. (FOR ICD-9 250.00, 250.01).   CLEAR EYES FOR DRY EYES OP Place 1 drop into both eyes 2 (two) times daily.   Fluticasone-Salmeterol 250-50 MCG/DOSE Aepb Commonly known as:  ADVAIR Inhale 1 puff into the lungs 2 (two) times daily.   furosemide 40 MG tablet Commonly known as:  LASIX Take 1 tablet (40 mg total) by mouth daily. What changed:  when to take this  additional instructions   gabapentin 300 MG capsule Commonly known as:  NEURONTIN Take 300 mg by mouth 3 (three) times daily.   insulin glargine 100 UNIT/ML injection Commonly known as:  LANTUS Inject 0.2 mLs (20 Units total) into the skin at bedtime.   Insulin Pen Needle 30G X 8 MM Misc Commonly known as:  NOVOFINE Inject 10 each into the skin as needed.   ipratropium-albuterol 0.5-2.5 (3) MG/3ML Soln Commonly known as:  DUONEB Take 3 mLs by nebulization every 4 (four)  hours as needed (wheezing, Shortness of breath).   potassium chloride 10 MEQ tablet Commonly known as:  K-DUR Take 1 tablet (10 mEq total) by mouth daily. Take extra tablet on Wednesdays and Saturdays (when taking Zaroxolyn)   rosuvastatin 40 MG tablet Commonly known as:  CRESTOR Take 1 tablet (40 mg total) by mouth daily at 6 PM.   sertraline 100 MG tablet Commonly known as:  ZOLOFT Take 150 mg by mouth daily.   spironolactone 25 MG tablet Commonly known as:  ALDACTONE Take 1 tablet (25 mg total) by mouth daily.            Durable Medical Equipment        Start     Ordered   03/15/17 1112  DME Oxygen  Once    Question Answer Comment  Mode or (Route) Nasal cannula   Liters per Minute 2   Frequency Continuous (stationary and portable oxygen unit needed)   Oxygen conserving device Yes   Oxygen delivery system Gas      03/15/17 1112       Discharge Care  Instructions        Start     Ordered   03/15/17 0000  spironolactone (ALDACTONE) 25 MG tablet  Daily     03/15/17 1112   03/15/17 0000  rosuvastatin (CRESTOR) 40 MG tablet  Daily-1800     03/15/17 1112   03/15/17 0000  insulin glargine (LANTUS) 100 UNIT/ML injection  Daily at bedtime     03/15/17 1112   03/15/17 0000  Insulin Pen Needle (NOVOFINE) 30G X 8 MM MISC  As needed     03/15/17 1112   03/15/17 0000  blood glucose meter kit and supplies KIT    Question Answer Comment  Number of strips 50   Number of lancets 50      03/15/17 1112   03/15/17 0000  Increase activity slowly     03/15/17 1112   03/15/17 0000  Diet - low sodium heart healthy     03/15/17 1112   03/15/17 0000  Diet Carb Modified     03/15/17 1112   03/15/17 0000  Call MD for:  temperature >100.4     03/15/17 1112   03/15/17 0000  Call MD for:  persistant nausea and vomiting     03/15/17 1112   03/15/17 0000  Call MD for:  severe uncontrolled pain     03/15/17 1112   03/15/17 0000  Call MD for:  difficulty breathing, headache or  visual disturbances     03/15/17 1112   03/15/17 0000  Call MD for:  hives     03/15/17 1112   03/15/17 0000  Call MD for:  persistant dizziness or light-headedness     03/15/17 1112   03/15/17 0000  Call MD for:  extreme fatigue     03/15/17 1112   03/15/17 0000  (HEART FAILURE PATIENTS) Call MD:  Anytime you have any of the following symptoms: 1) 3 pound weight gain in 24 hours or 5 pounds in 1 week 2) shortness of breath, with or without a dry hacking cough 3) swelling in the hands, feet or stomach 4) if you have to sleep on extra pillows at night in order to breathe.     03/15/17 1112   03/15/17 0000  furosemide (LASIX) 40 MG tablet  Daily     03/15/17 1133     Follow-up Information    Dorothy Spark, MD. Schedule an appointment as soon as possible for a visit in 2 week(s).   Specialty:  Cardiology Contact information: [address] [place] [address] [phone]        Maurice Small, MD. Schedule an appointment as soon as possible for a visit in 1 week(s).   Specialty:  Family Medicine Contact information: [address] [place] [address] [phone]          Allergies  Allergen Reactions  . Amoxicillin Anaphylaxis, Hives and Rash    Has patient had a PCN reaction causing immediate rash, facial/tongue/throat swelling, SOB or lightheadedness with hypotension: YES Positive reaction causing SEVERE RASH INVOLVING MUCUS MEMBRANES/SKIN NECROSIS: YES Reaction that required HOSPITALIZATION: YES Reaction occurring within the last 10 years: NO    Consultations: Cardiology  Procedures/Studies: Echo  Subjective: Seen and examined at bedside. Denied headache, dizziness, nausea vomiting chest pain or shortness of breath.  Discharge Exam: Vitals:   03/15/17 0640 03/15/17 0801  BP: (!) 110/45   Pulse: 73   Resp: 20   Temp: (!) 97.5 F (36.4 C)   SpO2:  99% 99%   Vitals:   03/14/17 1208 03/14/17 2042 03/15/17 0640  03/15/17 0801  BP: (!) 105/51 (!) 105/50 (!) 110/45   Pulse: 84 75 73   Resp: _0 Temp: 97.8 F (36.6 C) 98.1 F (36.7 C) (!) 97.5 F (36.4 C)   TempSrc: Oral Oral Oral   SpO2: 90% 93% 99% 99%  Weight:   82.9 kg (182 lb 11.2 oz)   Height:        General: Pt is alert, awake, not in acute distress Cardiovascular: RRR, S1/S2 +, no rubs, no gallops Respiratory: CTA bilaterally, no wheezing, no rhonchi Abdominal: Soft, NT, ND, bowel sounds + Extremities: no edema, no cyanosis    The results of significant diagnostics from this hospitalization (including imaging, microbiology, ancillary and laboratory) are listed below for reference.     Microbiology: Recent Results (from the past 240 hour(s))  Respiratory Panel by PCR     Status: None   Collection Time: 03/10/17  5:58 PM  Result Value Ref Range Status   Adenovirus NOT DETECTED NOT DETECTED Final   Coronavirus 229E NOT DETECTED NOT DETECTED Final   Coronavirus HKU1 NOT DETECTED NOT DETECTED Final   Coronavirus NL63 NOT DETECTED NOT DETECTED Final   Coronavirus OC43 NOT DETECTED NOT DETECTED Final   Metapneumovirus NOT DETECTED NOT DETECTED Final   Rhinovirus / Enterovirus NOT DETECTED NOT DETECTED Final   Influenza A NOT DETECTED NOT DETECTED Final   Influenza B NOT DETECTED NOT DETECTED Final   Parainfluenza Virus 1 NOT DETECTED NOT DETECTED Final   Parainfluenza Virus 2 NOT DETECTED NOT DETECTED Final   Parainfluenza Virus 3 NOT DETECTED NOT DETECTED Final   Parainfluenza Virus 4 NOT DETECTED NOT DETECTED Final   Respiratory Syncytial Virus NOT DETECTED NOT DETECTED Final   Bordetella pertussis NOT DETECTED NOT DETECTED Final   Chlamydophila pneumoniae NOT DETECTED NOT DETECTED Final   Mycoplasma pneumoniae NOT DETECTED NOT DETECTED Final     Labs: BNP (last 3 results)  Recent Labs  02/09/17 1518 03/10/17 1119 03/15/17 0519  BNP 957.3* 914.5* 299.2*   Basic Metabolic Panel:  Recent Labs Lab  03/10/17 1119 03/11/17 0616 03/12/17 0352 03/13/17 0318 03/14/17 0631  NA 134* 137 138 137 140  K 5.1 4.0 3.9 4.1 4.2  CL 96* 95* 97* 99* 99*  CO2 30 32 30 27 33*  GLUCOSE 458* 195* 124* 181* 150*  BUN 33* 34* 38* 52* 42*  CREATININE 1.47* 1.39* 1.42* 1.58* 1.26*  CALCIUM 9.6 9.4 9.2 9.2 9.1  MG  --  2.0  --   --   --   PHOS  --  4.3  --   --   --    Liver Function Tests: No results for input(s): AST, ALT, ALKPHOS, BILITOT, PROT, ALBUMIN in the last 168 hours. No results for input(s): LIPASE, AMYLASE in the last 168 hours. No results for input(s): AMMONIA in the last 168 hours. CBC:  Recent Labs Lab 03/10/17 1119 03/11/17 0616  WBC 7.1 9.2  HGB 14.5 15.1*  HCT 46.4* 47.5*  MCV 85.1 85.0  PLT 202 207   Cardiac Enzymes: No results for input(s): CKTOTAL, CKMB, CKMBINDEX, TROPONINI in the last 168 hours. BNP: Invalid input(s): POCBNP CBG:  Recent Labs Lab 03/14/17 0729 03/14/17 1133 03/14/17 1619 03/14/17 2102 03/15/17 0726  GLUCAP 137* 186* 269* 231* 188*   D-Dimer  Recent Labs  03/12/17 1450  DDIMER 1.11*   Hgb A1c No results for input(s):  HGBA1C in the last 72 hours. Lipid Profile No results for input(s): CHOL, HDL, LDLCALC, TRIG, CHOLHDL, LDLDIRECT in the last 72 hours. Thyroid function studies No results for input(s): TSH, T4TOTAL, T3FREE, THYROIDAB in the last 72 hours.  Invalid input(s): FREET3 Anemia work up No results for input(s): VITAMINB12, FOLATE, FERRITIN, TIBC, IRON, RETICCTPCT in the last 72 hours. Urinalysis    Component Value Date/Time   COLORURINE YELLOW 02/10/2017 0707   APPEARANCEUR HAZY (A) 02/10/2017 0707   LABSPEC 1.015 02/10/2017 0707   PHURINE 6.0 02/10/2017 0707   GLUCOSEU >=500 (A) 02/10/2017 0707   HGBUR SMALL (A) 02/10/2017 0707   BILIRUBINUR NEGATIVE 02/10/2017 0707   KETONESUR NEGATIVE 02/10/2017 0707   PROTEINUR 100 (A) 02/10/2017 0707   UROBILINOGEN 0.2 02/10/2013 1041   NITRITE POSITIVE (A) 02/10/2017 0707    LEUKOCYTESUR SMALL (A) 02/10/2017 0707   Sepsis Labs Invalid input(s): PROCALCITONIN,  WBC,  LACTICIDVEN Microbiology Recent Results (from the past 240 hour(s))  Respiratory Panel by PCR     Status: None   Collection Time: 03/10/17  5:58 PM  Result Value Ref Range Status   Adenovirus NOT DETECTED NOT DETECTED Final   Coronavirus 229E NOT DETECTED NOT DETECTED Final   Coronavirus HKU1 NOT DETECTED NOT DETECTED Final   Coronavirus NL63 NOT DETECTED NOT DETECTED Final   Coronavirus OC43 NOT DETECTED NOT DETECTED Final   Metapneumovirus NOT DETECTED NOT DETECTED Final   Rhinovirus / Enterovirus NOT DETECTED NOT DETECTED Final   Influenza A NOT DETECTED NOT DETECTED Final   Influenza B NOT DETECTED NOT DETECTED Final   Parainfluenza Virus 1 NOT DETECTED NOT DETECTED Final   Parainfluenza Virus 2 NOT DETECTED NOT DETECTED Final   Parainfluenza Virus 3 NOT DETECTED NOT DETECTED Final   Parainfluenza Virus 4 NOT DETECTED NOT DETECTED Final   Respiratory Syncytial Virus NOT DETECTED NOT DETECTED Final   Bordetella pertussis NOT DETECTED NOT DETECTED Final   Chlamydophila pneumoniae NOT DETECTED NOT DETECTED Final   Mycoplasma pneumoniae NOT DETECTED NOT DETECTED Final     Time coordinating discharge: 32 minutes  SIGNED:   Rosita Fire, MD  [facility] 03/15/2017, 11:34 AM  If 7PM-7AM, please contact night-coverage [email or web address] Password [email or web address]

## 2017-03-15 NOTE — Care Management Note (Signed)
Case Management Note  Patient Details  Name: Amari Burnsworth MRN: [id number] Date of Birth: 06-23-1946  Subjective/Objective:   Acute on chronic resp failure                 Action/Plan: Discharge Planning: Please see previous NCM notes. NCM spoke to pt and offered HH. Pt declines HH at this time.   PCP Maurice Small MD  Expected Discharge Date:  03/15/17               Expected Discharge Plan:  [place]  In-House Referral:  NA  Discharge planning Services  CM Consult  Post Acute Care Choice:  Home Health Choice offered to:  Patient  DME Arranged:  N/A DME Agency:  NA  HH Arranged:  Patient Refused [facility] Agency:  NA  Status of Service:  Completed, signed off  If discussed at [place] of Stay Meetings, dates discussed:    Additional Comments:  Erenest Rasher, RN 03/15/2017, 12:31 PM

## 2017-03-17 LAB — GLUCOSE, CAPILLARY: GLUCOSE-CAPILLARY: 202 mg/dL — AB (ref 65–99)

## 2017-03-27 DIAGNOSIS — Z23 Encounter for immunization: Secondary | ICD-10-CM | POA: Diagnosis not present

## 2017-03-27 DIAGNOSIS — Z5181 Encounter for therapeutic drug level monitoring: Secondary | ICD-10-CM | POA: Diagnosis not present

## 2017-04-02 ENCOUNTER — Ambulatory Visit (INDEPENDENT_AMBULATORY_CARE_PROVIDER_SITE_OTHER): Payer: PRIVATE HEALTH INSURANCE | Admitting: Physician Assistant

## 2017-04-02 ENCOUNTER — Encounter: Payer: Self-pay | Admitting: Physician Assistant

## 2017-04-02 VITALS — BP 116/68 | HR 82 | Ht 61.0 in | Wt 178.8 lb

## 2017-04-02 DIAGNOSIS — N183 Chronic kidney disease, stage 3 unspecified: Secondary | ICD-10-CM

## 2017-04-02 DIAGNOSIS — E785 Hyperlipidemia, unspecified: Secondary | ICD-10-CM | POA: Diagnosis not present

## 2017-04-02 DIAGNOSIS — I50810 Right heart failure, unspecified: Secondary | ICD-10-CM | POA: Insufficient documentation

## 2017-04-02 DIAGNOSIS — I272 Pulmonary hypertension, unspecified: Secondary | ICD-10-CM

## 2017-04-02 DIAGNOSIS — I10 Essential (primary) hypertension: Secondary | ICD-10-CM | POA: Diagnosis not present

## 2017-04-02 DIAGNOSIS — I2781 Cor pulmonale (chronic): Secondary | ICD-10-CM

## 2017-04-02 DIAGNOSIS — E875 Hyperkalemia: Secondary | ICD-10-CM

## 2017-04-02 DIAGNOSIS — I5032 Chronic diastolic (congestive) heart failure: Secondary | ICD-10-CM

## 2017-04-02 LAB — BASIC METABOLIC PANEL
BUN/Creatinine Ratio: 21 (ref 12–28)
BUN: 27 mg/dL (ref 8–27)
CO2: 30 mmol/L — ABNORMAL HIGH (ref 20–29)
CREATININE: 1.26 mg/dL — AB (ref 0.57–1.00)
Calcium: 9.6 mg/dL (ref 8.7–10.3)
Chloride: 99 mmol/L (ref 96–106)
GFR calc Af Amer: 50 mL/min/{1.73_m2} — ABNORMAL LOW (ref >59)
GFR, EST NON AFRICAN AMERICAN: 43 mL/min/{1.73_m2} — AB (ref >59)
GLUCOSE: 243 mg/dL — AB (ref 65–99)
Potassium: 4.2 mmol/L (ref 3.5–5.2)
Sodium: 137 mmol/L (ref 134–144)

## 2017-04-02 MED ORDER — ROSUVASTATIN CALCIUM 40 MG PO TABS: 40.0000 mg | ORAL_TABLET | Freq: Every day | ORAL | 3 refills | Status: DC

## 2017-04-02 NOTE — Patient Instructions (Addendum)
Medication Instructions:  Your physician has recommended you make the following change in your medication:  1.  DO NOT TAKE ANYMORE SPIRONOLACTONE UNTIL YOU ARE TOLD TO RESUME IT BY [place] 2.  START the Crestor 40 mg daily   Labwork: TODAY:  STAT BMET  6 WEEKS:  FASTING LIPIDS & LFT'S  Testing/Procedures: None ordered  Follow-Up: Your physician recommends that you schedule a follow-up appointment in: [place] [facility]  Your physician recommends that you schedule a follow-up appointment in: [place]   Any Other Special Instructions Will Be Listed Below (If Applicable).     If you need a refill on your cardiac medications before your next appointment, please call your pharmacy.

## 2017-04-02 NOTE — Progress Notes (Signed)
Cardiology Office Note    Date:  04/02/2017  ID:  Amy Shepard, DOB 1946-05-27, MRN [id number] PCP:  Maurice Small, MD  Cardiologist: [name]   Chief Complaint: f/u CHF  History of Present Illness:  Amy Shepard is a 71 y.o. female with history of lung cancer, depression, COPD with chronic repsiratory failure with home O2 (with history of noncompliance with this), chronic diastolic CHF with cor pulmonale felt due to COPD and noncompliance with O2, DM, hyperlipidemia, nonobstructive coronary artery disease, HLD, HTN, CKD III, pericardial effusion who presents for post-hospital follow-up. Prior cath 2015 showed 40% prox AD, 50-50% mLAD, otherwise calficiation but no obstruction in LCx/RCA. She had a low risk stress test 2017. She is originally from [place] but moved here several years ago to be closer to her children who relocated here.   She's had tumultuous prior course with dyspnea over the last 2 years with hypoxia and dyspnea, with particular issues with noncompliance with home O2. She had a limited echo 07/2016 showing marked enlargement of the RV with development of moderate to severe pulmonary HTN and small to moderate pericardial effusion. CT angio at that time was negative for PE. She was diuresed including with Zaroxolyn. She previously refused home oxygen at home due to the copay/weight and has frequently come into the office without it, with subsequent O2 sats into the 70s. She had seen oncology and was told everything was stable. More recently she was admitted 02/2017 with recurrent DOE, abdominal distention and hypoxia. She had been drinking multiple Dr. Samson Frederic a day. CXR showed with mild pulmonary vascular congestion and pulmonary interstitial edema. She was diuresed. D-dimer  was mildly elevated. Repeat VQ scan shows normal perfusion with heterogeneous ventilation consistent with COPD.  Lower extremity duplex study was negative for DVT. 2D echo during that admission 03/11/17 showed  EF 65-70%, grade 1 Dd, severely dilated RV with reduced RV function, mod-severe RAE, mild-mod TR, PASP 53mHg. Her RV failure has been felt to represent cor pulmonale with chronic RV dilation and decreased function with significant pulmonary hypertension, likely related to advanced COPD and noncompliance with oxygen therapy. Other issues include addition of Crestor for LDL of 165. Last labs from 8-03/2017 showed Cr 1.26 (variable up to nearly 1.6), K 4.2, glucose 150, A1C 13.6, Hgb 15.1. Of note, in 09/2016 she was seen by pulmonology who felt she had PAckerlywith probable underlying connective tissue disease although initial serologies were unremarkable - he felt she would likely need to go on pulmonary vasodilators. Sleep study and referral for RHC were planned at that time but never performed; patient cited cost and lack of insurance at that time. She's now on Medicaid. Last LFTs 07/2016 with elevated AP but normal AST/ALT.  She presents back to clinic today alone. Overall she says she's been feeling pretty good. O2 sat 79% on arrival, again here without her home O2. She states she accidentally left it in the car and forgot to charge the portable concentrator. This came up to 88% after several minutes of rest. She does state she is compliant with it at home and says O2 sat is usually around 90% with this. She continues to have chronic DOE but no recent DOE at rest. Swelling has resolved. She denies any CP or syncope. She was unaware she was supposed to start Crestor. She says she saw her PCP 03/27/17 at which time her potassium was stopped and Lasix was increased to 49mBID. I do not have full lab report  but her POC report indicates K was 5.8 and Cr was 1.760. The patient was unaware of any plan for repeat monitoring but says she does have a f/u appointment there in a few weeks. She is also on spironolactone. Potassium when leaving the hospital 9/1 was 4.2. Weight is several pounds below recent dc.     Past  Medical History:  Diagnosis Date  . Arthritis   . CAD (coronary artery disease)    a. Prior cath 2015 showed 40% prox AD, 50-50% mLAD, otherwise calcification but no obstruction in LCx/RCA.. Medical management recommended. b. 2017: low-risk NST.  [name] Chronic diastolic CHF (congestive heart failure) ([place])   . Chronic respiratory failure ([place])   . CKD (chronic kidney disease), stage III   . COPD (chronic obstructive pulmonary disease) ([place])   . Cor pulmonale ([name])    a. felt due to advanced COPD and noncompliance with O2.  . Depression   . Diabetes mellitus    [name]    dx  2008  . Hx of seasonal allergies   . Hypercholesteremia   . Hypertension    on no medications  . Lung cancer ([place])   . On home oxygen therapy    "2.5L; 24/7" (03/10/2017)  . Pericardial effusion    a. small-moderate in 07/2016.    Past Surgical History:  Procedure Laterality Date  . [facility] in [place]     bilateral cataracts  . LEFT HEART CATHETERIZATION WITH CORONARY ANGIOGRAM N/A 07/12/2014   Procedure: LEFT HEART CATHETERIZATION WITH CORONARY ANGIOGRAM;  Surgeon: [name], MD;  Location[facility] C[facility] LAB;  Service: Cardiovascular;  Laterality: N/A;  . MAXIMUM ACCESS (MAS)POSTERIOR LUMBAR INTERBODY FUSION (PLIF) 2 LEVEL N/A 02/22/2016   Procedure: Lumbar three-four - Lumbar four-five  MAXIMUM ACCESS SURGERY  POSTERIOR LUMBAR INTERBODY FUSION;  Surgeon: [name], MD;  Location: [facility]URO ORS;  Service: Neurosurgery;  Laterality: N/A;  . THORACOTOMY Right 2010   lower  . TONSILLECTOMY      Current Medications: Current Meds  Medication Sig  . aspirin EC 81 MG tablet Take 81 mg by mouth every evening.  . blood glucose meter kit and supplies KIT Dispense based on patient and insurance preference. Use up to four times daily as directed. (FOR ICD-9 250.00, 250.01).  . Carboxymethylcellul-Glycerin (CLEAR EYES FOR DRY EYES OP) Place 1 drop into  both eyes 2 (two) times daily.  . Fluticasone-Salmeterol (ADVAIR) 250-50 MCG/DOSE AEPB Inhale 1 puff into the lungs 2 (two) times daily.  . furosemide (LASIX) 40 MG tablet Take 40 mg by mouth 2 (two) times daily.  [name] gabapentin (NEURONTIN) 300 MG capsule Take 300 mg by mouth 3 (three) times daily.  . insulin glargine (LANTUS) 100 UNIT/ML injection Inject 0.2 mLs (20 Units total) into the skin at bedtime.  . Insulin Pen Needle (NOVOFINE) 30G X 8 MM MISC Inject 10 each into the skin as needed.  [name] ipratropium-albuterol (DUONEB) 0.5-2.5 (3) MG/3ML SOLN Take 3 mLs by nebulization every 4 (four) hours as needed (wheezing, Shortness of breath).  . sertraline (ZOLOFT) 100 MG tablet Take 150 mg by mouth daily.   [name] spironolactone (ALDACTONE) 25 MG tablet Take 1 tablet (25 mg total) by mouth daily.     Allergies:   Amoxicillin   Social History   Social History  . Marital status: Divorced    Spouse name: N/A  . Number of  children: N/A  . Years of education: N/A   Social History Main Topics  . Smoking status: Former Smoker    Packs/day: 1.00    Years: 30.00    Quit date: 07/14/2006  . Smokeless tobacco: Never Used  . Alcohol use No  . Drug use: No  . Sexual activity: Not Asked   Other Topics Concern  . None   Social History Narrative  . None     Family History:  Family History  Problem Relation Age of Onset  . Diabetes Father   . Heart failure Father   . Cancer Sister   . Diabetes Brother   . Heart failure Brother     ROS:   Please see the history of present illness.  All other systems are reviewed and otherwise negative.    PHYSICAL EXAM:   VS:  BP 116/68 (BP Location: Left Arm)   Pulse 82   Ht _0  (1.549 m)   Wt 178 lb 12.8 oz (81.1 kg)   SpO2 (!) 79%   BMI 33.78 kg/m   BMI: Body mass index is 33.78 kg/m. GEN: Chronically ill appearing WF ruddy complexion in no acute distress  HEENT: normocephalic, atraumatic Neck: no JVD, carotid bruits, or masses Cardiac: RRR;  no murmurs, rubs, or gallops, dusky lower extremities with similar ruddy complexion as face but no significant edema Respiratory:  clear to auscultation bilaterally, normal work of breathing GI: soft, nontender, nondistended, + BS MS: no deformity or atrophy  Skin: warm and dry, no rash Neuro:  Alert and Oriented x 3, Strength and sensation are intact, follows commands Psych: euthymic mood, full affect  Wt Readings from Last 3 Encounters:  04/02/17 178 lb 12.8 oz (81.1 kg)  03/15/17 182 lb 11.2 oz (82.9 kg)  02/13/17 189 lb 14.4 oz (86.1 kg)      Studies/Labs Reviewed:   EKG:  EKG was ordered today and personally reviewed by me and demonstrates NSR 74bpm, possible LAE, RVH, right axis deviation, cannot r/o prior anterior infarct  Recent Labs: 08/13/2016: ALT 14 09/05/2016: NT-Pro BNP 2,929 02/10/2017: TSH 2.059 03/11/2017: Hemoglobin 15.1; Magnesium 2.0; Platelets 207 03/14/2017: BUN 42; Creatinine, Ser 1.26; Potassium 4.2; Sodium 140 03/15/2017: B Natriuretic Peptide 444.1   Lipid Panel    Component Value Date/Time   CHOL 236 (H) 03/12/2017 0352   TRIG 194 (H) 03/12/2017 0352   HDL 32 (L) 03/12/2017 0352   CHOLHDL 7.4 03/12/2017 0352   VLDL 39 03/12/2017 0352   LDLCALC 165 (H) 03/12/2017 0352    Additional studies/ records that were reviewed today include: Summarized above.    ASSESSMENT & PLAN:   1. Chronic diastolic CHF/cor pulmonale/pulm HTN - suspect RV failure is precipitated by chronic respiratory failure with habitual noncompliance with O2 therapy. She again comes in today without home O2, citing she forgot to charge it but promises to wear it as soon as she gets home. She states her O2 sat usually runs around 90 with it on. Her O2 came up to 87-88% while at rest in the office. She appears euvolemic at present time but I am very concerned about her long-term prognosis. I agree with Macarthur Critchley prior concern for poor insight into her condition. We discussed risk of  progressive CHF, decline, death, and syncope if her hypoxia remains untreated. Pulm notes in 08/2016 alluded to w/u for primary PAH. She has had VQ ruling out chronic PE. I will refer to advanced CHF clinic for formal evaluation of this. I  believe she would also benefit from follow-up in their office for patient assistance as she does admit to limited insurance. This is why she has not gotten her sleep study yet. I also offered to call for someone to come pick her up and drive her home today as I advised that she not drive if she is not wearing her oxygen but she declined. 2. HTN - controlled. 3. CKD III with hyperkalemia on 8/94/83 - certainly concerning given that she is on spironolactone. PCP stopped potassium supplement. I have asked her not to take any further spironolactone until she hears from [place]. Repeat stat BMET. I did tell her if the level remained markedly high that we sometimes have to have patients go to the hospital to receive medication to help lower the level. EKG appears stable compared to prior. 4. Hyperlipidemia - she was unaware of recent cardiology recommendation to start Crestor. Will start and order f/u liver/lipids in 6 weeks.  Disposition: F/u with advanced HF team next available, and [name] in 3 months.    Medication Adjustments/Labs and Tests Ordered: Current medicines are reviewed at length with the patient today.  Concerns regarding medicines are outlined above. Medication changes, Labs and Tests ordered today are summarized above and listed in the Patient Instructions accessible in Encounters.   Signed, Charlie Pitter, PA-C  04/02/2017 2:47 PM    [address], [place], [place]  [address] Phone: [phone]; Fax: [phone]

## 2017-04-03 ENCOUNTER — Telehealth (HOSPITAL_COMMUNITY): Payer: Self-pay | Admitting: Cardiology

## 2017-04-03 NOTE — Telephone Encounter (Signed)
Left patient a message to call back, need to give her appt info for New Pulm HTN appt with Dr. Aundra Dubin

## 2017-04-06 ENCOUNTER — Telehealth: Payer: Self-pay | Admitting: Physician Assistant

## 2017-04-06 NOTE — Addendum Note (Signed)
Addended by: Velna Ochs on: 04/06/2017 02:03 PM   Modules accepted: Orders

## 2017-04-06 NOTE — Telephone Encounter (Signed)
Tried to reach patient, left another message for patient to call our office for new patient appt.

## 2017-04-06 NOTE — Telephone Encounter (Signed)
New message   Pt states she is returning a call to [employer]

## 2017-04-06 NOTE — Telephone Encounter (Signed)
Pt aware of lab results ./cy

## 2017-04-07 NOTE — Telephone Encounter (Signed)
Pt returned my call and spoke with [facility].  She is aware of appt info.  New pt packet mailed to patient.

## 2017-05-02 ENCOUNTER — Emergency Department (HOSPITAL_COMMUNITY): Payer: Medicare Other

## 2017-05-02 ENCOUNTER — Inpatient Hospital Stay (HOSPITAL_COMMUNITY)
Admission: EM | Admit: 2017-05-02 | Discharge: 2017-05-08 | DRG: 291 | Disposition: A | Discharge status: Skilled Nursing Facility | Payer: Medicare Other | Attending: Internal Medicine | Admitting: Internal Medicine

## 2017-05-02 ENCOUNTER — Encounter (HOSPITAL_COMMUNITY): Payer: Self-pay | Admitting: Emergency Medicine

## 2017-05-02 DIAGNOSIS — Z794 Long term (current) use of insulin: Secondary | ICD-10-CM

## 2017-05-02 DIAGNOSIS — N183 Chronic kidney disease, stage 3 unspecified: Secondary | ICD-10-CM | POA: Diagnosis present

## 2017-05-02 DIAGNOSIS — Z981 Arthrodesis status: Secondary | ICD-10-CM

## 2017-05-02 DIAGNOSIS — E785 Hyperlipidemia, unspecified: Secondary | ICD-10-CM | POA: Diagnosis present

## 2017-05-02 DIAGNOSIS — Z7982 Long term (current) use of aspirin: Secondary | ICD-10-CM | POA: Diagnosis not present

## 2017-05-02 DIAGNOSIS — Z7951 Long term (current) use of inhaled steroids: Secondary | ICD-10-CM | POA: Diagnosis not present

## 2017-05-02 DIAGNOSIS — I5033 Acute on chronic diastolic (congestive) heart failure: Secondary | ICD-10-CM | POA: Diagnosis present

## 2017-05-02 DIAGNOSIS — Z87891 Personal history of nicotine dependence: Secondary | ICD-10-CM | POA: Diagnosis not present

## 2017-05-02 DIAGNOSIS — R7989 Other specified abnormal findings of blood chemistry: Secondary | ICD-10-CM | POA: Diagnosis not present

## 2017-05-02 DIAGNOSIS — I509 Heart failure, unspecified: Secondary | ICD-10-CM

## 2017-05-02 DIAGNOSIS — I2510 Atherosclerotic heart disease of native coronary artery without angina pectoris: Secondary | ICD-10-CM | POA: Diagnosis not present

## 2017-05-02 DIAGNOSIS — R079 Chest pain, unspecified: Secondary | ICD-10-CM | POA: Diagnosis not present

## 2017-05-02 DIAGNOSIS — R0602 Shortness of breath: Secondary | ICD-10-CM | POA: Diagnosis not present

## 2017-05-02 DIAGNOSIS — I5031 Acute diastolic (congestive) heart failure: Secondary | ICD-10-CM | POA: Diagnosis present

## 2017-05-02 DIAGNOSIS — Z8249 Family history of ischemic heart disease and other diseases of the circulatory system: Secondary | ICD-10-CM

## 2017-05-02 DIAGNOSIS — F329 Major depressive disorder, single episode, unspecified: Secondary | ICD-10-CM | POA: Diagnosis present

## 2017-05-02 DIAGNOSIS — Z85118 Personal history of other malignant neoplasm of bronchus and lung: Secondary | ICD-10-CM | POA: Diagnosis not present

## 2017-05-02 DIAGNOSIS — J449 Chronic obstructive pulmonary disease, unspecified: Secondary | ICD-10-CM | POA: Diagnosis present

## 2017-05-02 DIAGNOSIS — J9621 Acute and chronic respiratory failure with hypoxia: Secondary | ICD-10-CM | POA: Diagnosis present

## 2017-05-02 DIAGNOSIS — E1122 Type 2 diabetes mellitus with diabetic chronic kidney disease: Secondary | ICD-10-CM | POA: Diagnosis present

## 2017-05-02 DIAGNOSIS — R278 Other lack of coordination: Secondary | ICD-10-CM | POA: Diagnosis not present

## 2017-05-02 DIAGNOSIS — W540XXA Bitten by dog, initial encounter: Secondary | ICD-10-CM

## 2017-05-02 DIAGNOSIS — Z880 Allergy status to penicillin: Secondary | ICD-10-CM

## 2017-05-02 DIAGNOSIS — Z87892 Personal history of anaphylaxis: Secondary | ICD-10-CM

## 2017-05-02 DIAGNOSIS — I110 Hypertensive heart disease with heart failure: Secondary | ICD-10-CM | POA: Diagnosis not present

## 2017-05-02 DIAGNOSIS — M7989 Other specified soft tissue disorders: Secondary | ICD-10-CM | POA: Diagnosis present

## 2017-05-02 DIAGNOSIS — I5040 Unspecified combined systolic (congestive) and diastolic (congestive) heart failure: Secondary | ICD-10-CM | POA: Diagnosis not present

## 2017-05-02 DIAGNOSIS — I10 Essential (primary) hypertension: Secondary | ICD-10-CM | POA: Diagnosis present

## 2017-05-02 DIAGNOSIS — J441 Chronic obstructive pulmonary disease with (acute) exacerbation: Secondary | ICD-10-CM | POA: Diagnosis present

## 2017-05-02 DIAGNOSIS — I130 Hypertensive heart and chronic kidney disease with heart failure and stage 1 through stage 4 chronic kidney disease, or unspecified chronic kidney disease: Principal | ICD-10-CM | POA: Diagnosis present

## 2017-05-02 DIAGNOSIS — J9620 Acute and chronic respiratory failure, unspecified whether with hypoxia or hypercapnia: Secondary | ICD-10-CM | POA: Diagnosis present

## 2017-05-02 DIAGNOSIS — M25542 Pain in joints of left hand: Secondary | ICD-10-CM | POA: Diagnosis not present

## 2017-05-02 DIAGNOSIS — M6281 Muscle weakness (generalized): Secondary | ICD-10-CM | POA: Diagnosis not present

## 2017-05-02 DIAGNOSIS — E118 Type 2 diabetes mellitus with unspecified complications: Secondary | ICD-10-CM

## 2017-05-02 DIAGNOSIS — Z833 Family history of diabetes mellitus: Secondary | ICD-10-CM | POA: Diagnosis not present

## 2017-05-02 DIAGNOSIS — I5032 Chronic diastolic (congestive) heart failure: Secondary | ICD-10-CM | POA: Diagnosis present

## 2017-05-02 DIAGNOSIS — Z9981 Dependence on supplemental oxygen: Secondary | ICD-10-CM

## 2017-05-02 DIAGNOSIS — R2689 Other abnormalities of gait and mobility: Secondary | ICD-10-CM | POA: Diagnosis not present

## 2017-05-02 DIAGNOSIS — R531 Weakness: Secondary | ICD-10-CM | POA: Diagnosis not present

## 2017-05-02 DIAGNOSIS — F32A Depression, unspecified: Secondary | ICD-10-CM | POA: Diagnosis present

## 2017-05-02 LAB — CBC WITH DIFFERENTIAL/PLATELET
Basophils Absolute: 0 10*3/uL (ref 0.0–0.1)
Basophils Relative: 0 %
Eosinophils Absolute: 0.1 10*3/uL (ref 0.0–0.7)
Eosinophils Relative: 2 %
HCT: 45.8 % (ref 36.0–46.0)
Hemoglobin: 15 g/dL (ref 12.0–15.0)
Lymphocytes Relative: 15 %
Lymphs Abs: 1.1 10*3/uL (ref 0.7–4.0)
MCH: 26.6 pg (ref 26.0–34.0)
MCHC: 32.8 g/dL (ref 30.0–36.0)
MCV: 81.2 fL (ref 78.0–100.0)
Monocytes Absolute: 0.9 10*3/uL (ref 0.1–1.0)
Monocytes Relative: 12 %
Neutro Abs: 5.5 10*3/uL (ref 1.7–7.7)
Neutrophils Relative %: 72 %
Platelets: 168 10*3/uL (ref 150–400)
RBC: 5.64 MIL/uL — ABNORMAL HIGH (ref 3.87–5.11)
RDW: 17.4 % — ABNORMAL HIGH (ref 11.5–15.5)
WBC: 7.7 10*3/uL (ref 4.0–10.5)

## 2017-05-02 LAB — CBG MONITORING, ED
GLUCOSE-CAPILLARY: 404 mg/dL — AB (ref 65–99)
GLUCOSE-CAPILLARY: 257 mg/dL — AB (ref 65–99)
Glucose-Capillary: 324 mg/dL — ABNORMAL HIGH (ref 65–99)

## 2017-05-02 LAB — URINALYSIS, ROUTINE W REFLEX MICROSCOPIC
Bilirubin Urine: NEGATIVE
KETONES UR: NEGATIVE mg/dL
Nitrite: POSITIVE — AB
PH: 5 (ref 5.0–8.0)
Protein, ur: 100 mg/dL — AB
Specific Gravity, Urine: 1.02 (ref 1.005–1.030)

## 2017-05-02 LAB — COMPREHENSIVE METABOLIC PANEL
ALT: 14 U/L (ref 14–54)
AST: 29 U/L (ref 15–41)
Albumin: 3.2 g/dL — ABNORMAL LOW (ref 3.5–5.0)
Alkaline Phosphatase: 129 U/L — ABNORMAL HIGH (ref 38–126)
Anion gap: 12 (ref 5–15)
BUN: 19 mg/dL (ref 6–20)
CO2: 24 mmol/L (ref 22–32)
Calcium: 8.8 mg/dL — ABNORMAL LOW (ref 8.9–10.3)
Chloride: 94 mmol/L — ABNORMAL LOW (ref 101–111)
Creatinine, Ser: 1.29 mg/dL — ABNORMAL HIGH (ref 0.44–1.00)
GFR calc Af Amer: 47 mL/min — ABNORMAL LOW (ref >60)
GFR calc non Af Amer: 41 mL/min — ABNORMAL LOW (ref >60)
Glucose, Bld: 410 mg/dL — ABNORMAL HIGH (ref 65–99)
Potassium: 3.5 mmol/L (ref 3.5–5.1)
Sodium: 130 mmol/L — ABNORMAL LOW (ref 135–145)
Total Bilirubin: 0.9 mg/dL (ref 0.3–1.2)
Total Protein: 6.4 g/dL — ABNORMAL LOW (ref 6.5–8.1)

## 2017-05-02 LAB — TROPONIN I
TROPONIN I: 0.09 ng/mL — AB (ref <0.03)
TROPONIN I: 0.09 ng/mL — AB (ref <0.03)

## 2017-05-02 LAB — GLUCOSE, CAPILLARY
GLUCOSE-CAPILLARY: 444 mg/dL — AB (ref 65–99)
GLUCOSE-CAPILLARY: 542 mg/dL — AB (ref 65–99)
Glucose-Capillary: 417 mg/dL — ABNORMAL HIGH (ref 65–99)

## 2017-05-02 LAB — I-STAT TROPONIN, ED: Troponin i, poc: 0.06 ng/mL (ref 0.00–0.08)

## 2017-05-02 LAB — BRAIN NATRIURETIC PEPTIDE: B Natriuretic Peptide: 1284.3 pg/mL — ABNORMAL HIGH (ref 0.0–100.0)

## 2017-05-02 LAB — HEMOGLOBIN A1C
Hgb A1c MFr Bld: 14.4 % — ABNORMAL HIGH (ref 4.8–5.6)
MEAN PLASMA GLUCOSE: 366.58 mg/dL

## 2017-05-02 LAB — TSH: TSH: 4.874 u[IU]/mL — AB (ref 0.350–4.500)

## 2017-05-02 MED ORDER — INSULIN GLARGINE 100 UNIT/ML ~~LOC~~ SOLN: 20.0000 [IU] | Freq: Every day | SUBCUTANEOUS | Status: DC

## 2017-05-02 MED ORDER — NEPRO/CARBSTEADY PO LIQD
237.0000 mL | Freq: Two times a day (BID) | ORAL | Status: DC
  Filled 2017-05-02 (×8): qty 237

## 2017-05-02 MED ORDER — HYDROCODONE-ACETAMINOPHEN 5-325 MG PO TABS
1.0000 | ORAL_TABLET | ORAL | Status: DC | PRN
  Administered 2017-05-02 – 2017-05-05 (×3): 1 via ORAL
  Administered 2017-05-05 – 2017-05-07 (×3): 2 via ORAL
  Filled 2017-05-02: qty 2
  Filled 2017-05-02 (×2): qty 1
  Filled 2017-05-02 (×2): qty 2

## 2017-05-02 MED ORDER — MOMETASONE FURO-FORMOTEROL FUM 200-5 MCG/ACT IN AERO
2.0000 | INHALATION_SPRAY | Freq: Two times a day (BID) | RESPIRATORY_TRACT | Status: DC
  Administered 2017-05-02 – 2017-05-08 (×12): 2 via RESPIRATORY_TRACT
  Filled 2017-05-02: qty 8.8

## 2017-05-02 MED ORDER — DOXYCYCLINE HYCLATE 100 MG PO TABS
100.0000 mg | ORAL_TABLET | Freq: Once | ORAL | Status: AC
  Administered 2017-05-02: 100 mg via ORAL
  Filled 2017-05-02: qty 1

## 2017-05-02 MED ORDER — IPRATROPIUM-ALBUTEROL 0.5-2.5 (3) MG/3ML IN SOLN
3.0000 mL | Freq: Four times a day (QID) | RESPIRATORY_TRACT | Status: DC
  Administered 2017-05-03 (×3): 3 mL via RESPIRATORY_TRACT
  Filled 2017-05-02 (×4): qty 3

## 2017-05-02 MED ORDER — SENNOSIDES-DOCUSATE SODIUM 8.6-50 MG PO TABS
1.0000 | ORAL_TABLET | Freq: Every evening | ORAL | Status: DC | PRN
  Administered 2017-05-07: 1 via ORAL
  Filled 2017-05-02: qty 1

## 2017-05-02 MED ORDER — INSULIN ASPART 100 UNIT/ML ~~LOC~~ SOLN
0.0000 [IU] | Freq: Three times a day (TID) | SUBCUTANEOUS | Status: DC
Start: 2017-05-03 — End: 2017-05-03
  Administered 2017-05-03: 12 [IU] via SUBCUTANEOUS
  Administered 2017-05-03: 3 [IU] via SUBCUTANEOUS

## 2017-05-02 MED ORDER — GUAIFENESIN ER 600 MG PO TB12
600.0000 mg | ORAL_TABLET | Freq: Two times a day (BID) | ORAL | Status: DC | PRN
  Administered 2017-05-06: 600 mg via ORAL
  Filled 2017-05-02: qty 1

## 2017-05-02 MED ORDER — FUROSEMIDE 10 MG/ML IJ SOLN
20.0000 mg | Freq: Once | INTRAMUSCULAR | Status: AC
  Administered 2017-05-02: 20 mg via INTRAVENOUS
  Filled 2017-05-02: qty 2

## 2017-05-02 MED ORDER — INSULIN ASPART 100 UNIT/ML ~~LOC~~ SOLN
0.0000 [IU] | Freq: Three times a day (TID) | SUBCUTANEOUS | Status: DC
  Administered 2017-05-02: 15 [IU] via SUBCUTANEOUS

## 2017-05-02 MED ORDER — IPRATROPIUM-ALBUTEROL 0.5-2.5 (3) MG/3ML IN SOLN
3.0000 mL | Freq: Once | RESPIRATORY_TRACT | Status: AC
  Administered 2017-05-02: 3 mL via RESPIRATORY_TRACT
  Filled 2017-05-02 (×2): qty 3

## 2017-05-02 MED ORDER — FUROSEMIDE 10 MG/ML IJ SOLN: 40.0000 mg | Freq: Two times a day (BID) | INTRAMUSCULAR | Status: DC

## 2017-05-02 MED ORDER — FUROSEMIDE 10 MG/ML IJ SOLN
40.0000 mg | Freq: Once | INTRAMUSCULAR | Status: AC
  Administered 2017-05-02: 40 mg via INTRAVENOUS
  Filled 2017-05-02: qty 4

## 2017-05-02 MED ORDER — ONDANSETRON HCL 4 MG PO TABS: 4.0000 mg | ORAL_TABLET | Freq: Four times a day (QID) | ORAL | Status: DC | PRN

## 2017-05-02 MED ORDER — BISACODYL 10 MG RE SUPP: 10.0000 mg | Freq: Every day | RECTAL | Status: DC | PRN

## 2017-05-02 MED ORDER — FUROSEMIDE 10 MG/ML IJ SOLN
60.0000 mg | Freq: Two times a day (BID) | INTRAMUSCULAR | Status: DC
  Administered 2017-05-02 – 2017-05-04 (×4): 60 mg via INTRAVENOUS
  Filled 2017-05-02 (×4): qty 6

## 2017-05-02 MED ORDER — FUROSEMIDE 20 MG PO TABS: 40.0000 mg | ORAL_TABLET | Freq: Two times a day (BID) | ORAL | Status: DC

## 2017-05-02 MED ORDER — ACETAMINOPHEN 325 MG PO TABS: 650.0000 mg | ORAL_TABLET | Freq: Four times a day (QID) | ORAL | Status: DC | PRN

## 2017-05-02 MED ORDER — ALBUTEROL SULFATE (2.5 MG/3ML) 0.083% IN NEBU: 2.5000 mg | INHALATION_SOLUTION | RESPIRATORY_TRACT | Status: DC | PRN

## 2017-05-02 MED ORDER — ROSUVASTATIN CALCIUM 40 MG PO TABS
40.0000 mg | ORAL_TABLET | Freq: Every day | ORAL | Status: DC
  Administered 2017-05-02 – 2017-05-07 (×6): 40 mg via ORAL
  Filled 2017-05-02 (×6): qty 1

## 2017-05-02 MED ORDER — GABAPENTIN 300 MG PO CAPS
300.0000 mg | ORAL_CAPSULE | Freq: Three times a day (TID) | ORAL | Status: DC
  Administered 2017-05-02 – 2017-05-08 (×18): 300 mg via ORAL
  Filled 2017-05-02 (×18): qty 1

## 2017-05-02 MED ORDER — INSULIN GLARGINE 100 UNIT/ML ~~LOC~~ SOLN
20.0000 [IU] | Freq: Once | SUBCUTANEOUS | Status: DC
  Filled 2017-05-02: qty 0.2

## 2017-05-02 MED ORDER — ASPIRIN EC 81 MG PO TBEC
81.0000 mg | DELAYED_RELEASE_TABLET | Freq: Every evening | ORAL | Status: DC
  Administered 2017-05-02 – 2017-05-07 (×6): 81 mg via ORAL
  Filled 2017-05-02 (×6): qty 1

## 2017-05-02 MED ORDER — INSULIN ASPART 100 UNIT/ML ~~LOC~~ SOLN
5.0000 [IU] | Freq: Once | SUBCUTANEOUS | Status: AC
  Administered 2017-05-02: 5 [IU] via INTRAVENOUS
  Filled 2017-05-02: qty 1

## 2017-05-02 MED ORDER — SERTRALINE HCL 50 MG PO TABS
150.0000 mg | ORAL_TABLET | Freq: Every day | ORAL | Status: DC
  Administered 2017-05-02 – 2017-05-08 (×7): 150 mg via ORAL
  Filled 2017-05-02 (×7): qty 1

## 2017-05-02 MED ORDER — SPIRONOLACTONE 25 MG PO TABS
25.0000 mg | ORAL_TABLET | Freq: Every day | ORAL | Status: DC
  Administered 2017-05-02 – 2017-05-08 (×7): 25 mg via ORAL
  Filled 2017-05-02 (×7): qty 1

## 2017-05-02 MED ORDER — HEPARIN SODIUM (PORCINE) 5000 UNIT/ML IJ SOLN
5000.0000 [IU] | Freq: Three times a day (TID) | INTRAMUSCULAR | Status: DC
  Administered 2017-05-02 – 2017-05-08 (×15): 5000 [IU] via SUBCUTANEOUS
  Filled 2017-05-02 (×14): qty 1

## 2017-05-02 MED ORDER — ONDANSETRON HCL 4 MG/2ML IJ SOLN: 4.0000 mg | Freq: Four times a day (QID) | INTRAMUSCULAR | Status: DC | PRN

## 2017-05-02 MED ORDER — IPRATROPIUM-ALBUTEROL 0.5-2.5 (3) MG/3ML IN SOLN
3.0000 mL | RESPIRATORY_TRACT | Status: DC
  Administered 2017-05-02 (×2): 3 mL via RESPIRATORY_TRACT
  Filled 2017-05-02 (×2): qty 3

## 2017-05-02 MED ORDER — POTASSIUM CHLORIDE 10 MEQ/100ML IV SOLN
10.0000 meq | INTRAVENOUS | Status: AC
  Administered 2017-05-02 (×3): 10 meq via INTRAVENOUS
  Filled 2017-05-02 (×3): qty 100

## 2017-05-02 MED ORDER — INSULIN GLARGINE 100 UNIT/ML ~~LOC~~ SOLN
20.0000 [IU] | Freq: Every day | SUBCUTANEOUS | Status: DC
  Administered 2017-05-02 – 2017-05-03 (×2): 20 [IU] via SUBCUTANEOUS
  Filled 2017-05-02 (×3): qty 0.2

## 2017-05-02 MED ORDER — INSULIN ASPART 100 UNIT/ML ~~LOC~~ SOLN
5.0000 [IU] | Freq: Once | SUBCUTANEOUS | Status: AC
  Administered 2017-05-02: 5 [IU] via SUBCUTANEOUS

## 2017-05-02 MED ORDER — ACETAMINOPHEN 650 MG RE SUPP: 650.0000 mg | Freq: Four times a day (QID) | RECTAL | Status: DC | PRN

## 2017-05-02 NOTE — ED Notes (Signed)
Lunch tray provided. 

## 2017-05-02 NOTE — H&P (Signed)
History and Physical    Amy Shepard DDU:[id number] DOB: [birth year] DOA: 05/02/2017   PCP: Maurice Small, MD   Patient coming from:  Home    Chief Complaint: Shortness of breath   HPI: Amy Shepard is a 71 y.o. female with medical history significant for CAD, COPD on 2.5 L at home, CK D st 3, dCHF, DM , depression St 2 lung cancer,  presenting to the emergency department with progressive, 5 day history of shortness of breath. She is in the process of moving to [place], and many of her medications were asked by the moving company, does, not having her insulin, Lasix, and oxygen since. She normally wears about 2.5 L at home. The patient has associated fatigue, productive cough with clear sputum without much change from prior, and some lower extremity edema. She denies any fever, chills or night sweats.She denies any chest pain or abdominal pain. She has intermittent nausea, but without vomiting or diarrhea. She denies any urinary symptoms.of note, she also complains of some pain in her left hand, due to the recent dog bite, she surrendered the dog to animal control, according to her, dd not have rabies    ED Course:  BP (!) 163/91   Pulse 81   Temp 98.8 F (37.1 C) (Oral)   Resp 13   Ht _0  (1.549 m)   Wt 81.6 kg (180 lb)   SpO2 97%   BMI 34.01 kg/m    received duo neb, O2 [place] currently at 4 L  Lasix 40 mg x1 at 9:45 am, then added another 40 mg at 10:45 am total 60 mg  Novolog 5 x1  and Lantus 20  4 glucose of 324 She also was given Vibratabs at the ED Troponin 0.06, in view of oxygen demand, EKG sinus rhythm, similar to the one in September 2018 Chest x-ray NAD Hand x-ray shows Soft tissue swelling without evidence of underlying fracture or retained radiopaque foreign body. BNP 1284   Review of Systems:  As per HPI otherwise all other systems reviewed and are negative  Past Medical History:  Diagnosis Date  . Arthritis   . CAD (coronary artery disease)    a. Prior  cath 2015 showed 40% prox AD, 50-50% mLAD, otherwise calcification but no obstruction in LCx/RCA.. Medical management recommended. b. 2017: low-risk NST.  [name] Chronic diastolic CHF (congestive heart failure) ([place])   . Chronic respiratory failure ([place])   . CKD (chronic kidney disease), stage III ([place])   . COPD (chronic obstructive pulmonary disease) ([place])   . Cor pulmonale ([name])    a. felt due to advanced COPD and noncompliance with O2.  . Depression   . Diabetes mellitus    [name]    dx  2008  . Hx of seasonal allergies   . Hypercholesteremia   . Hypertension    on no medications  . Lung cancer ([place])   . On home oxygen therapy    "2.5L; 24/7" (03/10/2017)  . Pericardial effusion    a. small-moderate in 07/2016.    Past Surgical History:  Procedure Laterality Date  . [facility] in [place]     bilateral cataracts  . LEFT HEART CATHETERIZATION WITH CORONARY ANGIOGRAM N/A 07/12/2014   Procedure: LEFT HEART CATHETERIZATION WITH CORONARY ANGIOGRAM;  Surgeon: [name], MD;  Location[facility] C[facility] LAB;  Service: Cardiovascular;  Laterality: N/A;  . MAXIMUM ACCESS (  MAS)POSTERIOR LUMBAR INTERBODY FUSION (PLIF) 2 LEVEL N/A 02/22/2016   Procedure: Lumbar three-four - Lumbar four-five  MAXIMUM ACCESS SURGERY  POSTERIOR LUMBAR INTERBODY FUSION;  Surgeon: [name], MD;  Location: [facility];  Service: Neurosurgery;  Laterality: N/A;  . THORACOTOMY Right 2010   lower  . TONSILLECTOMY      Social History Social History   Social History  . Marital status: Divorced    Spouse name: N/A  . Number of children: N/A  . Years of education: N/A   Occupational History  . Not on file.   Social History Main Topics  . Smoking status: Former Smoker    Packs/day: 1.00    Years: 30.00    Quit date: 07/14/2006  . Smokeless tobacco: Never Used  . Alcohol use No  . Drug use: No  . Sexual activity: Not on file   Other Topics  Concern  . Not on file   Social History Narrative  . No narrative on file     Allergies  Allergen Reactions  . Amoxicillin Anaphylaxis, Hives and Rash    Has patient had a PCN reaction causing immediate rash, facial/tongue/throat swelling, SOB or lightheadedness with hypotension: YES Positive reaction causing SEVERE RASH INVOLVING MUCUS MEMBRANES/SKIN NECROSIS: YES Reaction that required HOSPITALIZATION: YES Reaction occurring within the last 10 years: NO    Family History  Problem Relation Age of Onset  . Diabetes Father   . Heart failure Father   . Cancer Sister   . Diabetes Brother   . Heart failure Brother       Prior to Admission medications   Medication Sig Start Date End Date Taking? Authorizing Provider  aspirin EC 81 MG tablet Take 81 mg by mouth every evening.    [provider]  blood glucose meter kit and supplies KIT Dispense based on patient and insurance preference. Use up to four times daily as directed. (FOR ICD-9 250.00, 250.01). 03/15/17   Rosita Fire, MD  Carboxymethylcellul-Glycerin (CLEAR EYES FOR DRY EYES OP) Place 1 drop into both eyes 2 (two) times daily.    [provider]  Fluticasone-Salmeterol (ADVAIR) 250-50 MCG/DOSE AEPB Inhale 1 puff into the lungs 2 (two) times daily.    [provider]  furosemide (LASIX) 40 MG tablet Take 40 mg by mouth 2 (two) times daily. 02/18/17   [provider]  gabapentin (NEURONTIN) 300 MG capsule Take 300 mg by mouth 3 (three) times daily.    [provider]  insulin glargine (LANTUS) 100 UNIT/ML injection Inject 0.2 mLs (20 Units total) into the skin at bedtime. 03/15/17   Rosita Fire, MD  Insulin Pen Needle (NOVOFINE) 30G X 8 MM MISC Inject 10 each into the skin as needed. 03/15/17   Rosita Fire, MD  ipratropium-albuterol (DUONEB) 0.5-2.5 (3) MG/3ML SOLN Take 3 mLs by nebulization every 4 (four) hours as needed (wheezing, Shortness of breath). 03/13/16    Johnson, Clanford L, MD  rosuvastatin (CRESTOR) 40 MG tablet Take 1 tablet (40 mg total) by mouth daily at 6 PM. 04/02/17   Dunn, Nedra Hai, PA-C  sertraline (ZOLOFT) 100 MG tablet Take 150 mg by mouth daily.     [provider]  spironolactone (ALDACTONE) 25 MG tablet Take 1 tablet (25 mg total) by mouth daily. 03/15/17 06/13/17  Rosita Fire, MD    Physical Exam:  Vitals:   05/02/17 0815 05/02/17 0930 05/02/17 1015 05/02/17 1115  BP: (!) 134/98 (!) 156/90 (!) 161/95 [name])  163/91  Pulse: 75 75 72 81  Resp: (!) 23 (!) 23 13   Temp:      TempSrc:      SpO2: 93% 95% 94% 97%  Weight:      Height:       Constitutional: NAD, calm, more  Comfortable  Eyes: PERRL, lids and conjunctivae normal ENMT: Mucous membranes are moist, without exudate or lesions  Neck: normal, supple, no masses, no thyromegaly Respiratory:  + minimal  bilateral expiratory  wheezing, and bibasilar crackles. Normal respiratory effort  Cardiovascular: Regular rate and rhythm,  murmur, rubs or gallops. No extremity edema. 2+ pedal pulses. No carotid bruits.  Abdomen: Soft, non tender, No hepatosplenomegaly. Bowel sounds positive. SHe is wearing a catheter for comfort  Musculoskeletal: no clubbing / cyanosis. Moves all extremities Skin: several areas of bit in left hand without purulence or significant erythema. Onychomycosis in toes . Flushed fascies. Neurologic: Sensation intact  Strength equal in all extremities Psychiatric:   Alert and oriented x 3. Normal mood.     Labs on Admission: I have personally reviewed following labs and imaging studies  CBC:  Recent Labs Lab 05/02/17 0756  WBC 7.7  NEUTROABS 5.5  HGB 15.0  HCT 45.8  MCV 81.2  PLT 086    Basic Metabolic Panel:  Recent Labs Lab 05/02/17 0756  NA 130*  K 3.5  CL 94*  CO2 24  GLUCOSE 410*  BUN 19  CREATININE 1.29*  CALCIUM 8.8*    GFR: Estimated Creatinine Clearance: 39.3 mL/min (A) (by C-G formula based on SCr of 1.29  mg/dL (H)).  Liver Function Tests:  Recent Labs Lab 05/02/17 0756  AST 29  ALT 14  ALKPHOS 129*  BILITOT 0.9  PROT 6.4*  ALBUMIN 3.2*   No results for input(s): LIPASE, AMYLASE in the last 168 hours. No results for input(s): AMMONIA in the last 168 hours.  Coagulation Profile: No results for input(s): INR, PROTIME in the last 168 hours.  Cardiac Enzymes: No results for input(s): CKTOTAL, CKMB, CKMBINDEX, TROPONINI in the last 168 hours.  BNP (last 3 results)  Recent Labs  07/21/16 1210 07/29/16 1159 09/05/16 1131  PROBNP 2,696* 3,611* 2,929*    HbA1C: No results for input(s): HGBA1C in the last 72 hours.  CBG:  Recent Labs Lab 05/02/17 0754 05/02/17 1133  GLUCAP 404* 324*    Lipid Profile: No results for input(s): CHOL, HDL, LDLCALC, TRIG, CHOLHDL, LDLDIRECT in the last 72 hours.  Thyroid Function Tests: No results for input(s): TSH, T4TOTAL, FREET4, T3FREE, THYROIDAB in the last 72 hours.  Anemia Panel: No results for input(s): VITAMINB12, FOLATE, FERRITIN, TIBC, IRON, RETICCTPCT in the last 72 hours.  Urine analysis:    Component Value Date/Time   COLORURINE YELLOW 02/10/2017 0707   APPEARANCEUR HAZY (A) 02/10/2017 0707   LABSPEC 1.015 02/10/2017 0707   PHURINE 6.0 02/10/2017 0707   GLUCOSEU >=500 (A) 02/10/2017 0707   HGBUR SMALL (A) 02/10/2017 0707   BILIRUBINUR NEGATIVE 02/10/2017 0707   KETONESUR NEGATIVE 02/10/2017 0707   PROTEINUR 100 (A) 02/10/2017 0707   UROBILINOGEN 0.2 02/10/2013 1041   NITRITE POSITIVE (A) 02/10/2017 0707   LEUKOCYTESUR SMALL (A) 02/10/2017 0707    Sepsis Labs: _0 (procalcitonin:4,lacticidven:4) )No results found for this or any previous visit (from the past 240 hour(s)).   Radiological Exams on Admission: Dg Chest 2 View  Result Date: 05/02/2017 CLINICAL DATA:  Chest pain pain and shortness of breath for 1 week EXAM: CHEST  2 VIEW COMPARISON:  03/13/2017 FINDINGS: Cardiac shadow is mildly enlarged.  Aortic calcifications are again seen. The lungs are well aerated bilaterally without focal infiltrate or sizable effusion. Postsurgical changes are noted in the right infrahilar region. No bony abnormality is seen. IMPRESSION: No acute abnormality noted. Electronically Signed   By: Inez Catalina M.D.   On: 05/02/2017 09:12   Dg Hand Complete Left  Result Date: 05/02/2017 CLINICAL DATA:  71 year old female with pain and swelling of the left hand after sustaining a dog bite. EXAM: LEFT HAND - COMPLETE 3+ VIEW COMPARISON:  None. FINDINGS: No evidence of acute fracture or malalignment. No radiopaque foreign body identified. Mild soft tissue swelling over the dorsal aspect of the hand. Mild ulnar minus variant at the wrist. Degenerative osteoarthritis present at the thumb [facility] joint. IMPRESSION: 1. Soft tissue swelling without evidence of underlying fracture or retained radiopaque foreign body. 2. 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 Electronically Signed   By: Jacqulynn Cadet M.D.   On: 05/02/2017 09:07    EKG: Independently reviewed.  Assessment/Plan Active Problems:   Acute on chronic respiratory failure ([name])   Coronary artery disease involving native coronary artery   Chronic obstructive pulmonary disease ([name])   Type 2 diabetes mellitus with complication, with long-term current use of insulin ([name])   Depression   HTN (hypertension)   CKD (chronic kidney disease), stage III ([name])   Chronic diastolic CHF (congestive heart failure) ([name])   Dog bite      Acute hypoxic respiratory failure likely secondary toAcute on chronic diastolic CHF and COPD exacerbation. received duo neb, O2  currently at 4 L   CXR NAD  Last 2 D echo 03/11/2017 EF 61-60, vigorous systolic, Gr 1 DD; BNP 7371  Troponin 0.06, in view of oxygen demand, EKG sinus rhythm, similar to the one in September 2018.  Weight 180 lb.Lasix 40 mg x1 at 9:45 am, then added another 40 mg at 10:45 am total 60 mg, she also  received nebs with some improvement of her wheezing. No steroids received  Case discussed with Dr. Jerilee Hoh who agreed with the plan  Place telemetry obs  CHF order set IV Lasix 60  mg bid  Monitor Troponin Strict I/Os Duoneb q 4 and Albuterol q 2 h prn  Consider steroids if respiratory worsens  Mucinex O2 prn Incentive spirometry Patient is on Doxycycline due to animal bite, should cover respiratory issues as well    Chronic kidney disease stage    baseline creatinine 1.2, stable    Lab Results  Component Value Date   CREATININE 1.29 (H) 05/02/2017   CREATININE 1.26 (H) 04/02/2017   CREATININE 1.26 (H) 03/14/2017  Repeat CMET in am   Hypertension BP (!) 163/91   Pulse 81   Resume  home anti-hypertensive medications   Hyperlipidemia Continue home statins   Type II Diabetes Novolog 5 x1  and Lantus 20  4 glucose of 324 Lab Results  Component Value Date   HGBA1C 13.6 (H) 03/11/2017  Hgb A1C Resume Lantus tonight SSI Continue Neurontin  Depression Continue home Zoloft  Dog Bite, left hand . Hand x-ray shows Soft tissue swelling without evidence of underlying fracture or retained radiopaque foreign body.  given Vibratabs at the ED. Exam without erythema or purulence  Continue Doxy 100 mg bid Will consider tetanus shot while here as she is moving to [place] after d/c  Monitor closely     DVT prophylaxis:  Heparin  Code Status:    [place]  Family  Communication:  Discussed with patient Disposition Plan: Expect patient to be discharged to home after condition improves Consults called:    None  Admission status:  Tele Obs    Sabas Frett E, PA-C [facility]   05/02/2017, 12:24 PM

## 2017-05-02 NOTE — ED Notes (Signed)
ED Provider at bedside. 

## 2017-05-02 NOTE — ED Provider Notes (Signed)
[facility] EMERGENCY DEPARTMENT Provider Note   CSN: [id number] Arrival date & time: 05/02/17  1607     History   Chief Complaint Chief Complaint  Patient presents with  . Shortness of Breath    HPI Amy Shepard is a 71 y.o. female with history of CAD, COPD, CKD, CHF, diabetes who presents with shortness of breath.  Patient reports she is in the process of moving to [place] and many of her medications and oxygen were excellently packed by the moving company.  She has not had her insulin, Lasix, oxygen for the past 5 days.  She is normally on 2.5 L of oxygen at home.  She has become progressively short of breath.  She is also had some lower extremity edema.  She is also had associated fatigue.  She has had a cough with productive, clear sputum, however she states she always has a cough and has not really had much of a sputum change.  Patient denies any chest pain, abdominal pain.  She has had some associated nausea, but no vomiting.  She denies any urinary symptoms.  Patient also reports a dog bite that occurred 5 days ago by her own dog on her left hand.  Her dog is not up-to-date on its vaccinations.  She denies that it has been acting out of the ordinary, however.  The dog bit her when she went to grab her a bag that her dog wanted.  She has had pain to the hand.  HPI  Past Medical History:  Diagnosis Date  . Arthritis   . CAD (coronary artery disease)    a. Prior cath 2015 showed 40% prox AD, 50-50% mLAD, otherwise calcification but no obstruction in LCx/RCA.. Medical management recommended. b. 2017: low-risk NST.  [name] Chronic diastolic CHF (congestive heart failure) ([place])   . Chronic respiratory failure ([place])   . CKD (chronic kidney disease), stage III ([place])   . COPD (chronic obstructive pulmonary disease) ([place])   . Cor pulmonale ([name])    a. felt due to advanced COPD and noncompliance with O2.  . Depression   . Diabetes mellitus    [name]    dx  2008    . Hx of seasonal allergies   . Hypercholesteremia   . Hypertension    on no medications  . Lung cancer ([place])   . On home oxygen therapy    "2.5L; 24/7" (03/10/2017)  . Pericardial effusion    a. small-moderate in 07/2016.    Patient Active Problem List   Diagnosis Date Noted  . Dog bite 05/02/2017  . CHF exacerbation ([place]) 05/02/2017  . Chronic diastolic CHF (congestive heart failure) ([place]) 04/02/2017  . Cor pulmonale ([place]) 04/02/2017  . Acute on chronic respiratory failure ([place]) 03/10/2017  . CKD (chronic kidney disease), stage III ([place]) 03/10/2017  . Chronic pain 03/10/2017  . Lactic acidosis 02/10/2017  . Acute respiratory failure with hypoxia ([place]) 02/09/2017  . Acute on chronic diastolic CHF (congestive heart failure) ([place]) 06/23/2016  . HTN (hypertension) 06/23/2016  . Type 2 diabetes mellitus with complication, with long-term current use of insulin ([place]) 03/11/2016  . Depression 03/11/2016  . Chronic obstructive pulmonary disease ([place]) 03/10/2016  . S/P lumbar spinal fusion 02/22/2016  . Coronary artery disease involving native coronary artery 07/12/2014  . Pain in the chest   . Malignant neoplasm of lower lobe of right lung ([place]) 05/25/2014  . Lung cancer ([place]) 09/01/2012    Past Surgical History:  Procedure  Laterality Date  . [facility] in [place]     bilateral cataracts  . LEFT HEART CATHETERIZATION WITH CORONARY ANGIOGRAM N/A 07/12/2014   Procedure: LEFT HEART CATHETERIZATION WITH CORONARY ANGIOGRAM;  Surgeon: [name], MD;  Location: [facility] CATH LAB;  Service: Cardiovascular;  Laterality: N/A;  . MAXIMUM ACCESS (MAS)POSTERIOR LUMBAR INTERBODY FUSION (PLIF) 2 LEVEL N/A 02/22/2016   Procedure: Lumbar three-four - Lumbar four-five  MAXIMUM ACCESS SURGERY  POSTERIOR LUMBAR INTERBODY FUSION;  Surgeon: [name], MD;  Location: [facility] NEURO ORS;  Service: Neurosurgery;  Laterality: N/A;  . THORACOTOMY  Right 2010   lower  . TONSILLECTOMY      OB History    No data available       Home Medications    Prior to Admission medications   Medication Sig Start Date End Date Taking? Authorizing Provider  aspirin EC 81 MG tablet Take 81 mg by mouth every evening.    [provider]  blood glucose meter kit and supplies KIT Dispense based on patient and insurance preference. Use up to four times daily as directed. (FOR ICD-9 250.00, 250.01). 03/15/17   Rosita Fire, MD  Carboxymethylcellul-Glycerin (CLEAR EYES FOR DRY EYES OP) Place 1 drop into both eyes 2 (two) times daily.    [provider]  Fluticasone-Salmeterol (ADVAIR) 250-50 MCG/DOSE AEPB Inhale 1 puff into the lungs 2 (two) times daily.    [provider]  furosemide (LASIX) 40 MG tablet Take 40 mg by mouth 2 (two) times daily. 02/18/17   [provider]  gabapentin (NEURONTIN) 300 MG capsule Take 300 mg by mouth 3 (three) times daily.    [provider]  insulin glargine (LANTUS) 100 UNIT/ML injection Inject 0.2 mLs (20 Units total) into the skin at bedtime. 03/15/17   Rosita Fire, MD  Insulin Pen Needle (NOVOFINE) 30G X 8 MM MISC Inject 10 each into the skin as needed. 03/15/17   Rosita Fire, MD  ipratropium-albuterol (DUONEB) 0.5-2.5 (3) MG/3ML SOLN Take 3 mLs by nebulization every 4 (four) hours as needed (wheezing, Shortness of breath). 03/13/16   Johnson, Clanford L, MD  rosuvastatin (CRESTOR) 40 MG tablet Take 1 tablet (40 mg total) by mouth daily at 6 PM. 04/02/17   Dunn, Nedra Hai, PA-C  sertraline (ZOLOFT) 100 MG tablet Take 150 mg by mouth daily.     [provider]  spironolactone (ALDACTONE) 25 MG tablet Take 1 tablet (25 mg total) by mouth daily. 03/15/17 06/13/17  Rosita Fire, MD    Family History Family History  Problem Relation Age of Onset  . Diabetes Father   . Heart failure Father   . Cancer Sister   . Diabetes Brother   . Heart  failure Brother     Social History Social History  Substance Use Topics  . Smoking status: Former Smoker    Packs/day: 1.00    Years: 30.00    Quit date: 07/14/2006  . Smokeless tobacco: Never Used  . Alcohol use No     Allergies   Amoxicillin   Review of Systems Review of Systems  Constitutional: Negative for chills and fever.  HENT: Negative for facial swelling and sore throat.   Respiratory: Positive for cough, shortness of breath and wheezing.   Cardiovascular: Positive for leg swelling. Negative for chest pain.  Gastrointestinal: Positive for nausea. Negative for abdominal pain and vomiting.  Genitourinary: Negative for dysuria.  Musculoskeletal: Positive for arthralgias (L hand). Negative for back pain.  Skin: Positive for wound. Negative for rash.  Neurological: Negative for headaches.  Psychiatric/Behavioral: The patient is not nervous/anxious.      Physical Exam Updated Vital Signs BP (!) 163/91   Pulse 81   Temp 98.8 F (37.1 C) (Oral)   Resp 13   Ht _0  (1.549 m)   Wt 81.6 kg (180 lb)   SpO2 97%   BMI 34.01 kg/m   Physical Exam  Constitutional: She appears well-developed and well-nourished. No distress.  HENT:  Head: Normocephalic and atraumatic.  Mouth/Throat: Oropharynx is clear and moist. No oropharyngeal exudate.  Eyes: Pupils are equal, round, and reactive to light. Conjunctivae are normal. Right eye exhibits no discharge. Left eye exhibits no discharge. No scleral icterus.  Neck: Normal range of motion. Neck supple. No thyromegaly present.  Cardiovascular: Normal rate, regular rhythm, normal heart sounds and intact distal pulses.  Exam reveals no gallop and no friction rub.   No murmur heard. Pulmonary/Chest: Effort normal. No stridor. No respiratory distress. She has wheezes (bilateral, expiratory). She has rales (bases bilaterally).  Abdominal: Soft. Bowel sounds are normal. She exhibits no distension. There is no tenderness. There is no  rebound and no guarding.  Musculoskeletal: She exhibits no edema.  Lymphadenopathy:    She has no cervical adenopathy.  Neurological: She is alert. Coordination normal.  Skin: Skin is warm and dry. No rash noted. She is not diaphoretic. No pallor.  Several wounds to left hand, no drainage; some mild surrounding erythema and ecchymosis; tenderness; see photo  Psychiatric: She has a normal mood and affect.  Nursing note and vitals reviewed.         Rolan Bucco is actually going to get a badED Treatments / Results  Labs (all labs ordered are listed, but only abnormal results are displayed) Labs Reviewed  COMPREHENSIVE METABOLIC PANEL - Abnormal; Notable for the following:       Result Value   Sodium 130 (*)    Chloride 94 (*)    Glucose, Bld 410 (*)    Creatinine, Ser 1.29 (*)    Calcium 8.8 (*)    Total Protein 6.4 (*)    Albumin 3.2 (*)    Alkaline Phosphatase 129 (*)    GFR calc non Af Amer 41 (*)    GFR calc Af Amer 47 (*)    All other components within normal limits  CBC WITH DIFFERENTIAL/PLATELET - Abnormal; Notable for the following:    RBC 5.64 (*)    RDW 17.4 (*)    All other components within normal limits  BRAIN NATRIURETIC PEPTIDE - Abnormal; Notable for the following:    B Natriuretic Peptide 1,284.3 (*)    All other components within normal limits  CBG MONITORING, ED - Abnormal; Notable for the following:    Glucose-Capillary 404 (*)    All other components within normal limits  CBG MONITORING, ED - Abnormal; Notable for the following:    Glucose-Capillary 324 (*)    All other components within normal limits  TSH  TROPONIN I  TROPONIN I  TROPONIN I  URINALYSIS, ROUTINE W REFLEX MICROSCOPIC  HEMOGLOBIN A1C  I-STAT TROPONIN, ED    EKG  EKG Interpretation  Date/Time:  Saturday May 02 2017 07:58:11 EDT Ventricular Rate:  72 PR Interval:    QRS Duration: 111 QT Interval:  537 QTC Calculation: 588 R Axis:   -113 Text Interpretation:  Sinus rhythm  Consider right ventricular hypertrophy Inferior infarct, old Prolonged QT interval No significant change since last tracing Confirmed by Theotis Burrow [phone]) on 05/02/2017 12:58:06 PM       Radiology Dg Chest 2 View  Result Date: 05/02/2017 CLINICAL DATA:  Chest pain pain and shortness of breath for 1 week EXAM: CHEST  2 VIEW COMPARISON:  03/13/2017 FINDINGS: Cardiac shadow is mildly enlarged. Aortic calcifications are again seen. The lungs are well aerated bilaterally without focal infiltrate or sizable effusion. Postsurgical changes are noted in the right infrahilar region. No bony abnormality is seen. IMPRESSION: No acute abnormality noted. Electronically Signed   By: Inez Catalina M.D.   On: 05/02/2017 09:12   Dg Hand Complete Left  Result Date: 05/02/2017 CLINICAL DATA:  71 year old female with pain and swelling of the left hand after sustaining a dog bite. EXAM: LEFT HAND - COMPLETE 3+ VIEW COMPARISON:  None. FINDINGS: No evidence of acute fracture or malalignment. No radiopaque foreign body identified. Mild soft tissue swelling over the dorsal aspect of the hand. Mild ulnar minus variant at the wrist. Degenerative osteoarthritis present at the thumb [facility] joint. IMPRESSION: 1. Soft tissue swelling without evidence of underlying fracture or retained radiopaque foreign body. 2. 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 Electronically Signed   By: Jacqulynn Cadet M.D.   On: 05/02/2017 09:07    Procedures Procedures (including critical care time)  Medications Ordered in ED Medications  ipratropium-albuterol (DUONEB) 0.5-2.5 (3) MG/3ML nebulizer solution 3 mL (not administered)  albuterol (PROVENTIL) (2.5 MG/3ML) 0.083% nebulizer solution 2.5 mg (not administered)  guaiFENesin (MUCINEX) 12 hr tablet 600 mg (not administered)  furosemide (LASIX) tablet 40 mg (not administered)  rosuvastatin (CRESTOR) tablet 40 mg (not administered)  spironolactone (ALDACTONE) tablet 25 mg (not  administered)  aspirin EC tablet 81 mg (not administered)  mometasone-formoterol (DULERA) 200-5 MCG/ACT inhaler 2 puff (not administered)  gabapentin (NEURONTIN) capsule 300 mg (not administered)  sertraline (ZOLOFT) tablet 150 mg (not administered)  acetaminophen (TYLENOL) tablet 650 mg (not administered)    Or  acetaminophen (TYLENOL) suppository 650 mg (not administered)  senna-docusate (Senokot-S) tablet 1 tablet (not administered)  bisacodyl (DULCOLAX) suppository 10 mg (not administered)  ondansetron (ZOFRAN) tablet 4 mg (not administered)    Or  ondansetron (ZOFRAN) injection 4 mg (not administered)  heparin injection 5,000 Units (not administered)  HYDROcodone-acetaminophen (NORCO/VICODIN) 5-325 MG per tablet 1-2 tablet (not administered)  insulin aspart (novoLOG) injection 0-9 Units (not administered)  insulin glargine (LANTUS) injection 20 Units (not administered)  furosemide (LASIX) injection 60 mg (not administered)  ipratropium-albuterol (DUONEB) 0.5-2.5 (3) MG/3ML nebulizer solution 3 mL (3 mLs Nebulization Given 05/02/17 1129)  furosemide (LASIX) injection 40 mg (40 mg Intravenous Given 05/02/17 1135)  doxycycline (VIBRA-TABS) tablet 100 mg (100 mg Oral Given 05/02/17 1128)  furosemide (LASIX) injection 20 mg (20 mg Intravenous Given 05/02/17 1136)  insulin aspart (novoLOG) injection 5 Units (5 Units Intravenous Given 05/02/17 1133)     Initial Impression / Assessment and Plan / ED Course  I have reviewed the triage vital signs and the nursing notes.  Pertinent labs & imaging results that were available during my care of the patient were reviewed by me and considered in my medical decision making (see chart for details).     Patient with shortness of breath presumed CHF exacerbation.  Elevated BNP, 1284.3.  CXR is negative.  Patient also with elevated blood glucose.  Insulin given in the ED.  60 mg Lasix given  in ED as well as DuoNeb.  Patient continues to be on 4 L of  oxygen with some shortness of breath, much worse on exertion.  I feel patient could benefit from admission.  I spoke with Sherrilyn Rist, PA-C with [facility] who will admit the patient for further evaluation and treatment. Regarding patient's dog bite, no signs of significant infection, however will cover with doxycycline (patient has anaphylaxis allergy to amoxicillin and cannot use Augmentin). Dog has been observed by patient and has not exhibited any abnormal behavior. Patient also evaluated by Dr. Rex Kras who guided the patient's management and agrees with plan.  Final Clinical Impressions(s) / ED Diagnoses   Final diagnoses:  SOB (shortness of breath)  Acute on chronic congestive heart failure, unspecified heart failure type [facility])    New Prescriptions New Prescriptions   No medications on file     Frederica Kuster, Hershal Coria 05/02/17 1305    Little, Wenda Overland, MD 05/03/17 [phone]

## 2017-05-02 NOTE — ED Triage Notes (Signed)
Patient brought in by [facility] from a local hotel [facility]) for shortness of breath. Patient normally takes insulin, lasix, and is on continuous [place] O2 at 2.5L but has not had access to any of those since Tuesday. She is in the process of moving back to [place] and her medications were accidentally placed in a storage unit by a moving company.  Her oxygen was lost due to health insurance issues.    Patient arrives to ED on 4L [place] with O2 saturation 96%, states the shortness of breath only occurs when she tries to ambulate. Productive cough with colorless sputum.  Edema in lower extremities. Alert and oriented and in no apparent distress at this time. 20g saline lock in left AC.

## 2017-05-02 NOTE — ED Notes (Signed)
Lunch ordered for pt per admit order.

## 2017-05-02 NOTE — ED Notes (Signed)
Attempted to call report.

## 2017-05-02 NOTE — Progress Notes (Addendum)
CRITICAL VALUE ALERT  Critical Value:  CBG 542  Date & Time Notied:  05/02/2017  Provider Notified: Brandon Melnick NP  Orders Received/Actions taken: Paged provider and notified. Awaiting new orders.  2047 Verbal order received from Gustavo Lah to give additional 5 units of novolog with night dose of lantus.

## 2017-05-03 ENCOUNTER — Inpatient Hospital Stay (HOSPITAL_COMMUNITY): Payer: Medicare Other

## 2017-05-03 DIAGNOSIS — I5031 Acute diastolic (congestive) heart failure: Secondary | ICD-10-CM

## 2017-05-03 LAB — GLUCOSE, CAPILLARY
GLUCOSE-CAPILLARY: 270 mg/dL — AB (ref 65–99)
GLUCOSE-CAPILLARY: 198 mg/dL — AB (ref 65–99)
GLUCOSE-CAPILLARY: 288 mg/dL — AB (ref 65–99)
Glucose-Capillary: 223 mg/dL — ABNORMAL HIGH (ref 65–99)
Glucose-Capillary: 177 mg/dL — ABNORMAL HIGH (ref 65–99)

## 2017-05-03 LAB — COMPREHENSIVE METABOLIC PANEL
ALBUMIN: 3 g/dL — AB (ref 3.5–5.0)
ALK PHOS: 134 U/L — AB (ref 38–126)
ALT: 12 U/L — AB (ref 14–54)
AST: 23 U/L (ref 15–41)
Anion gap: 8 (ref 5–15)
BUN: 17 mg/dL (ref 6–20)
CALCIUM: 9 mg/dL (ref 8.9–10.3)
CO2: 33 mmol/L — AB (ref 22–32)
CREATININE: 1.2 mg/dL — AB (ref 0.44–1.00)
Chloride: 96 mmol/L — ABNORMAL LOW (ref 101–111)
GFR calc Af Amer: 52 mL/min — ABNORMAL LOW (ref >60)
GFR calc non Af Amer: 45 mL/min — ABNORMAL LOW (ref >60)
GLUCOSE: 178 mg/dL — AB (ref 65–99)
Potassium: 3.3 mmol/L — ABNORMAL LOW (ref 3.5–5.1)
SODIUM: 137 mmol/L (ref 135–145)
Total Bilirubin: 0.7 mg/dL (ref 0.3–1.2)
Total Protein: 6.1 g/dL — ABNORMAL LOW (ref 6.5–8.1)

## 2017-05-03 LAB — CBC
HCT: 46.6 % — ABNORMAL HIGH (ref 36.0–46.0)
Hemoglobin: 14.8 g/dL (ref 12.0–15.0)
MCH: 25.8 pg — AB (ref 26.0–34.0)
MCHC: 31.8 g/dL (ref 30.0–36.0)
MCV: 81.3 fL (ref 78.0–100.0)
PLATELETS: 154 10*3/uL (ref 150–400)
RBC: 5.73 MIL/uL — ABNORMAL HIGH (ref 3.87–5.11)
RDW: 17 % — ABNORMAL HIGH (ref 11.5–15.5)
WBC: 7.8 10*3/uL (ref 4.0–10.5)

## 2017-05-03 LAB — POTASSIUM: POTASSIUM: 3.9 mmol/L (ref 3.5–5.1)

## 2017-05-03 LAB — TROPONIN I: Troponin I: 0.08 ng/mL (ref <0.03)

## 2017-05-03 MED ORDER — LISINOPRIL 2.5 MG PO TABS
2.5000 mg | ORAL_TABLET | Freq: Every day | ORAL | Status: DC
  Administered 2017-05-03 – 2017-05-05 (×3): 2.5 mg via ORAL
  Filled 2017-05-03 (×3): qty 1

## 2017-05-03 MED ORDER — IPRATROPIUM-ALBUTEROL 0.5-2.5 (3) MG/3ML IN SOLN
3.0000 mL | Freq: Two times a day (BID) | RESPIRATORY_TRACT | Status: DC
  Administered 2017-05-04 – 2017-05-08 (×9): 3 mL via RESPIRATORY_TRACT
  Filled 2017-05-03 (×9): qty 3

## 2017-05-03 MED ORDER — INSULIN ASPART 100 UNIT/ML ~~LOC~~ SOLN
4.0000 [IU] | Freq: Three times a day (TID) | SUBCUTANEOUS | Status: DC
  Administered 2017-05-03 – 2017-05-04 (×4): 4 [IU] via SUBCUTANEOUS

## 2017-05-03 MED ORDER — INSULIN ASPART 100 UNIT/ML ~~LOC~~ SOLN
0.0000 [IU] | Freq: Every day | SUBCUTANEOUS | Status: DC
  Administered 2017-05-03: 3 [IU] via SUBCUTANEOUS

## 2017-05-03 MED ORDER — INSULIN ASPART 100 UNIT/ML ~~LOC~~ SOLN
0.0000 [IU] | Freq: Three times a day (TID) | SUBCUTANEOUS | Status: DC
  Administered 2017-05-03: 3 [IU] via SUBCUTANEOUS
  Administered 2017-05-04: 8 [IU] via SUBCUTANEOUS
  Administered 2017-05-04: 5 [IU] via SUBCUTANEOUS
  Administered 2017-05-04: 2 [IU] via SUBCUTANEOUS

## 2017-05-03 NOTE — Progress Notes (Signed)
PROGRESS NOTE    Amy Shepard  NGE:[id number] DOB: [birth year] DOA: 05/02/2017 PCP: Maurice Small, MD     Brief Narrative:  71 year old woman admitted from home on 10/20 due to shortness of breath. She is in the process of moving to [place] and her medications had been placed in storage and she has not been able to access them for the past 5 days. She was found to have acute CHF and admission was requested.   Assessment & Plan:   Active Problems:   Coronary artery disease involving native coronary artery   Chronic obstructive pulmonary disease ([name])   Type 2 diabetes mellitus with complication, with long-term current use of insulin ([name])   Depression   HTN (hypertension)   Acute on chronic respiratory failure ([name])   CKD (chronic kidney disease), stage III ([name])   Chronic diastolic CHF (congestive heart failure) ([name])   Dog bite   CHF exacerbation ([name])   Acute diastolic CHF (congestive heart failure) ([name])   Acute on chronic diastolic CHF -Echo from August 2018 shows an ejection fraction of 65-70% and grade 1 diastolic dysfunction without wall motion abnormalities. -She was admitted, placed on Lasix 60 mg IV twice a day. She had 4000 mL of urine output overnight and is overall 3.1 L negative since admission.  -Volume status is improved, still has excess volume. -Given improved renal function and improved volume status will continue current dose of IV Lasix and additional 24 hours.  -On spironolactone. Not on beta blocker due to relative bradycardia and exercise intolerance while on beta blocker in the past. Unclear why she is not on an ACE inhibitor, will start low dose. -Has a chronic oxygen requirement, remains on baseline oxygen while in the hospital.  Elevated troponin -Flat, no chest pain. -Likely due to acute CHF, recent echo without wall motion abnormalities. -No further plans for cardiac workup at this point.  Chronic kidney disease stage II -Creatinine remains  at baseline of around 1.2, improved from 1.29 yesterday.  Hypertension -Controlled, continue current medication.  Hyperlipidemia -Continue statin.  Type 2 diabetes -Uncontrolled. Hemoglobin A1c is 14.4. CBGs overnight for 44, 542, 417, 223, 270, 177. -Continue Lantus 20, add meal and nighttime coverage to SSI.    DVT prophylaxis: Subcutaneous heparin Code Status: Full Code Family Communication: Patient only Disposition Plan: home when ready; anticipate 48 hours  Consultants:   None  Procedures:   None  Antimicrobials:  Anti-infectives    Start     Dose/Rate Route Frequency Ordered Stop   05/02/17 1100  doxycycline (VIBRA-TABS) tablet 100 mg     100 mg Oral  Once 05/02/17 1054 05/02/17 1128       Subjective: Feels well, no complaints.  Objective: Vitals:   05/03/17 0256 05/03/17 0523 05/03/17 0851 05/03/17 0900  BP:  114/69    Pulse:  66    Resp:  18    Temp:  98 F (36.7 C)    TempSrc:  Oral    SpO2: 97% 92%  (!) 85%  Weight:  78.3 kg (172 lb 11.2 oz) 77.9 kg (171 lb 11.8 oz)   Height:        Intake/Output Summary (Last 24 hours) at 05/03/17 1153 Last data filed at 05/03/17 0928  Gross per 24 hour  Intake              900 ml  Output             4000 ml  Net            -  3100 ml   Filed Weights   05/02/17 1554 05/03/17 0523 05/03/17 0851  Weight: 78.3 kg (172 lb 11.2 oz) 78.3 kg (172 lb 11.2 oz) 77.9 kg (171 lb 11.8 oz)    Examination:  General exam: Alert, awake, oriented x 3 Respiratory system: Clear to auscultation. Respiratory effort normal. Cardiovascular system:RRR. No murmurs, rubs, gallops. Gastrointestinal system: Abdomen is nondistended, soft and nontender. No organomegaly or masses felt. Normal bowel sounds heard. Central nervous system: Alert and oriented. No focal neurological deficits. Extremities:  2+ pitting edema up to mid calves bilaterally Skin: No rashes, lesions or ulcers Psychiatry: Judgement and insight appear normal. Mood  & affect appropriate.     Data Reviewed: I have personally reviewed following labs and imaging studies  CBC:  Recent Labs Lab 05/02/17 0756 05/03/17 0656  WBC 7.7 7.8  NEUTROABS 5.5  --   HGB 15.0 14.8  HCT 45.8 46.6*  MCV 81.2 81.3  PLT 168 619   Basic Metabolic Panel:  Recent Labs Lab 05/02/17 0756 05/02/17 2357 05/03/17 0656  NA 130*  --  137  K 3.5 3.9 3.3*  CL 94*  --  96*  CO2 24  --  33*  GLUCOSE 410*  --  178*  BUN 19  --  17  CREATININE 1.29*  --  1.20*  CALCIUM 8.8*  --  9.0   GFR: Estimated Creatinine Clearance: 41.2 mL/min (A) (by C-G formula based on SCr of 1.2 mg/dL (H)). Liver Function Tests:  Recent Labs Lab 05/02/17 0756 05/03/17 0656  AST 29 23  ALT 14 12*  ALKPHOS 129* 134*  BILITOT 0.9 0.7  PROT 6.4* 6.1*  ALBUMIN 3.2* 3.0*   No results for input(s): LIPASE, AMYLASE in the last 168 hours. No results for input(s): AMMONIA in the last 168 hours. Coagulation Profile: No results for input(s): INR, PROTIME in the last 168 hours. Cardiac Enzymes:  Recent Labs Lab 05/02/17 1307 05/02/17 1813 05/02/17 2357  TROPONINI 0.09* 0.09* 0.08*   BNP (last 3 results)  Recent Labs  07/21/16 1210 07/29/16 1159 09/05/16 1131  PROBNP 2,696* 3,611* 2,929*   HbA1C:  Recent Labs  05/02/17 1308  HGBA1C 14.4*   CBG:  Recent Labs Lab 05/02/17 2029 05/02/17 2246 05/03/17 0158 05/03/17 0745 05/03/17 1101  GLUCAP 542* 417* 223* 177* 270*   Lipid Profile: No results for input(s): CHOL, HDL, LDLCALC, TRIG, CHOLHDL, LDLDIRECT in the last 72 hours. Thyroid Function Tests:  Recent Labs  05/02/17 1309  TSH 4.874*   Anemia Panel: No results for input(s): VITAMINB12, FOLATE, FERRITIN, TIBC, IRON, RETICCTPCT in the last 72 hours. Urine analysis:    Component Value Date/Time   COLORURINE YELLOW 05/02/2017 1308   APPEARANCEUR HAZY (A) 05/02/2017 1308   LABSPEC 1.020 05/02/2017 1308   PHURINE 5.0 05/02/2017 1308   GLUCOSEU >=500 (A)  05/02/2017 1308   HGBUR SMALL (A) 05/02/2017 1308   BILIRUBINUR NEGATIVE 05/02/2017 1308   KETONESUR NEGATIVE 05/02/2017 1308   PROTEINUR 100 (A) 05/02/2017 1308   UROBILINOGEN 0.2 02/10/2013 1041   NITRITE POSITIVE (A) 05/02/2017 1308   LEUKOCYTESUR SMALL (A) 05/02/2017 1308   Sepsis Labs: _0 (procalcitonin:4,lacticidven:4)  )No results found for this or any previous visit (from the past 240 hour(s)).       Radiology Studies: Dg Chest 2 View  Result Date: 05/03/2017 CLINICAL DATA:  CHF exacerbation. EXAM: CHEST  2 VIEW COMPARISON:  05/02/2017 and prior exams FINDINGS: Cardiomegaly and mild pulmonary vascular congestion again noted. Mild lingular atelectasis now  identified. There is no evidence of airspace disease, pleural effusion or pneumothorax. IMPRESSION: New mild lingular atelectasis without other significant change. Cardiomegaly and mild pulmonary vascular congestion again noted. Electronically Signed   By: Margarette Canada M.D.   On: 05/03/2017 07:36   Dg Chest 2 View  Result Date: 05/02/2017 CLINICAL DATA:  Chest pain pain and shortness of breath for 1 week EXAM: CHEST  2 VIEW COMPARISON:  03/13/2017 FINDINGS: Cardiac shadow is mildly enlarged. Aortic calcifications are again seen. The lungs are well aerated bilaterally without focal infiltrate or sizable effusion. Postsurgical changes are noted in the right infrahilar region. No bony abnormality is seen. IMPRESSION: No acute abnormality noted. Electronically Signed   By: Inez Catalina M.D.   On: 05/02/2017 09:12   Dg Hand Complete Left  Result Date: 05/02/2017 CLINICAL DATA:  71 year old female with pain and swelling of the left hand after sustaining a dog bite. EXAM: LEFT HAND - COMPLETE 3+ VIEW COMPARISON:  None. FINDINGS: No evidence of acute fracture or malalignment. No radiopaque foreign body identified. Mild soft tissue swelling over the dorsal aspect of the hand. Mild ulnar minus variant at the wrist. Degenerative  osteoarthritis present at the thumb [facility] joint. IMPRESSION: 1. Soft tissue swelling without evidence of underlying fracture or retained radiopaque foreign body. 2. 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 Electronically Signed   By: Jacqulynn Cadet M.D.   On: 05/02/2017 09:07        Scheduled Meds: . aspirin EC  81 mg Oral QPM  . feeding supplement (NEPRO CARB STEADY)  237 mL Oral BID BM  . furosemide  60 mg Intravenous BID  . gabapentin  300 mg Oral TID  . heparin  5,000 Units Subcutaneous Q8H  . insulin aspart  0-15 Units Subcutaneous TID WC  . insulin aspart  0-5 Units Subcutaneous QHS  . insulin aspart  4 Units Subcutaneous TID WC  . insulin glargine  20 Units Subcutaneous QHS  . ipratropium-albuterol  3 mL Nebulization Q6H  . mometasone-formoterol  2 puff Inhalation BID  . rosuvastatin  40 mg Oral q1800  . sertraline  150 mg Oral Daily  . spironolactone  25 mg Oral Daily   Continuous Infusions:   LOS: 1 day    Time spent: 35 minutes. Greater than 50% of this time was spent in direct contact with the patient coordinating care.     Lelon Frohlich, MD [facility] Pager [phone]  If 7PM-7AM, please contact night-coverage [email or web address] Password [email or web address] 05/03/2017, 11:53 AM

## 2017-05-04 LAB — GLUCOSE, CAPILLARY
GLUCOSE-CAPILLARY: 126 mg/dL — AB (ref 65–99)
GLUCOSE-CAPILLARY: 137 mg/dL — AB (ref 65–99)
Glucose-Capillary: 202 mg/dL — ABNORMAL HIGH (ref 65–99)
Glucose-Capillary: 280 mg/dL — ABNORMAL HIGH (ref 65–99)

## 2017-05-04 LAB — BASIC METABOLIC PANEL
Anion gap: 9 (ref 5–15)
BUN: 22 mg/dL — ABNORMAL HIGH (ref 6–20)
CALCIUM: 8.8 mg/dL — AB (ref 8.9–10.3)
CO2: 32 mmol/L (ref 22–32)
CREATININE: 1.24 mg/dL — AB (ref 0.44–1.00)
Chloride: 92 mmol/L — ABNORMAL LOW (ref 101–111)
GFR calc Af Amer: 50 mL/min — ABNORMAL LOW (ref >60)
GFR calc non Af Amer: 43 mL/min — ABNORMAL LOW (ref >60)
GLUCOSE: 213 mg/dL — AB (ref 65–99)
Potassium: 3.2 mmol/L — ABNORMAL LOW (ref 3.5–5.1)
Sodium: 133 mmol/L — ABNORMAL LOW (ref 135–145)

## 2017-05-04 MED ORDER — INSULIN GLARGINE 100 UNIT/ML ~~LOC~~ SOLN
25.0000 [IU] | Freq: Every day | SUBCUTANEOUS | Status: DC
  Administered 2017-05-04 – 2017-05-07 (×4): 25 [IU] via SUBCUTANEOUS
  Filled 2017-05-04 (×4): qty 0.25

## 2017-05-04 MED ORDER — FUROSEMIDE 40 MG PO TABS
40.0000 mg | ORAL_TABLET | Freq: Two times a day (BID) | ORAL | Status: DC
  Administered 2017-05-04 – 2017-05-08 (×8): 40 mg via ORAL
  Filled 2017-05-04 (×8): qty 1

## 2017-05-04 NOTE — Progress Notes (Signed)
PROGRESS NOTE    Amy Shepard  XVE:[id number] DOB: 04-02-1946 DOA: 05/02/2017 PCP: Maurice Small, MD     Brief Narrative:  71 year old woman admitted from home on 10/20 due to shortness of breath. She is in the process of moving to [place] and her medications had been placed in storage and she has not been able to access them for the past 5 days. She was found to have acute CHF and admission was requested.   Assessment & Plan:   Active Problems:   Coronary artery disease involving native coronary artery   Chronic obstructive pulmonary disease ([name])   Type 2 diabetes mellitus with complication, with long-term current use of insulin ([name])   Depression   HTN (hypertension)   Acute on chronic respiratory failure ([name])   CKD (chronic kidney disease), stage III ([name])   Chronic diastolic CHF (congestive heart failure) ([name])   Dog bite   CHF exacerbation ([name])   Acute diastolic CHF (congestive heart failure) ([name])   Acute on chronic diastolic CHF -Echo from August 2018 shows an ejection fraction of 65-70% and grade 1 diastolic dysfunction without wall motion abnormalities. -She was admitted, placed on Lasix 60 mg IV twice a day.  Overnight she has had 950 cc of urine output and is -3.4 L since admission. -As her creatinine is starting to rise and volume status is much improved, we will go ahead and discontinue IV Lasix and transition over to her home dose of 40 mg twice daily orally and follow diuresis and renal function in a.m. -Volume status is improved. -On spironolactone. Not on beta blocker due to relative bradycardia and exercise intolerance while on beta blocker in the past. Unclear why she is not on an ACE inhibitor, will start low dose. -Has a chronic oxygen requirement, remains on baseline oxygen while in the hospital.  Elevated troponin -Flat, no chest pain. -Likely due to acute CHF, recent echo without wall motion abnormalities. -No further plans for cardiac workup at  this point.  Chronic kidney disease stage II -Creatinine remains at baseline of around 1.2.  Hypertension -Controlled, continue current medication.  Hyperlipidemia -Continue statin.  Type 2 diabetes -Uncontrolled. Hemoglobin A1c is 14.4. CBGs overnight 198, 288, 202, 280. -We will increase Lantus to 25 units., add meal and nighttime coverage to SSI.    DVT prophylaxis: Subcutaneous heparin Code Status: Full Code Family Communication: Patient only Disposition Plan: home when ready; anticipate 24-48 hours  Consultants:   None  Procedures:   None  Antimicrobials:  Anti-infectives    Start     Dose/Rate Route Frequency Ordered Stop   05/02/17 1100  doxycycline (VIBRA-TABS) tablet 100 mg     100 mg Oral  Once 05/02/17 1054 05/02/17 1128       Subjective: Complains of neck pain, says the pillows are too uncomfortable.  Objective: Vitals:   05/04/17 0124 05/04/17 0626 05/04/17 0742 05/04/17 0800  BP: 115/60 (!) 123/54  (!) 104/58  Pulse: 79 71  76  Resp: _0 Temp: 98.5 F (36.9 C) 98.1 F (36.7 C)  98.1 F (36.7 C)  TempSrc: Oral Oral  Oral  SpO2: 95% 97% 94% 98%  Weight:  77 kg (169 lb 11.2 oz)    Height:        Intake/Output Summary (Last 24 hours) at 05/04/17 1533 Last data filed at 05/04/17 0910  Gross per 24 hour  Intake  240 ml  Output              950 ml  Net             -710 ml   Filed Weights   05/03/17 0523 05/03/17 0851 05/04/17 0626  Weight: 78.3 kg (172 lb 11.2 oz) 77.9 kg (171 lb 11.8 oz) 77 kg (169 lb 11.2 oz)    Examination:  General exam: Alert, awake, oriented x 3 Respiratory system: Clear to auscultation. Respiratory effort normal. Cardiovascular system:RRR. No murmurs, rubs, gallops. Gastrointestinal system: Abdomen is nondistended, soft and nontender. No organomegaly or masses felt. Normal bowel sounds heard. Central nervous system: Alert and oriented. No focal neurological deficits. Extremities: 1+ pitting  edema bilaterally, positive pulses Skin: No rashes, lesions or ulcers Psychiatry: Judgement and insight appear normal. Mood & affect appropriate.      Data Reviewed: I have personally reviewed following labs and imaging studies  CBC:  Recent Labs Lab 05/02/17 0756 05/03/17 0656  WBC 7.7 7.8  NEUTROABS 5.5  --   HGB 15.0 14.8  HCT 45.8 46.6*  MCV 81.2 81.3  PLT 168 667   Basic Metabolic Panel:  Recent Labs Lab 05/02/17 0756 05/02/17 2357 05/03/17 0656 05/04/17 0558  NA 130*  --  137 133*  K 3.5 3.9 3.3* 3.2*  CL 94*  --  96* 92*  CO2 24  --  33* 32  GLUCOSE 410*  --  178* 213*  BUN 19  --  17 22*  CREATININE 1.29*  --  1.20* 1.24*  CALCIUM 8.8*  --  9.0 8.8*   GFR: Estimated Creatinine Clearance: 39.7 mL/min (A) (by C-G formula based on SCr of 1.24 mg/dL (H)). Liver Function Tests:  Recent Labs Lab 05/02/17 0756 05/03/17 0656  AST 29 23  ALT 14 12*  ALKPHOS 129* 134*  BILITOT 0.9 0.7  PROT 6.4* 6.1*  ALBUMIN 3.2* 3.0*   No results for input(s): LIPASE, AMYLASE in the last 168 hours. No results for input(s): AMMONIA in the last 168 hours. Coagulation Profile: No results for input(s): INR, PROTIME in the last 168 hours. Cardiac Enzymes:  Recent Labs Lab 05/02/17 1307 05/02/17 1813 05/02/17 2357  TROPONINI 0.09* 0.09* 0.08*   BNP (last 3 results)  Recent Labs  07/21/16 1210 07/29/16 1159 09/05/16 1131  PROBNP 2,696* 3,611* 2,929*   HbA1C:  Recent Labs  05/02/17 1308  HGBA1C 14.4*   CBG:  Recent Labs Lab 05/03/17 1101 05/03/17 1635 05/03/17 2133 05/04/17 0753 05/04/17 1206  GLUCAP 270* 198* 288* 202* 280*   Lipid Profile: No results for input(s): CHOL, HDL, LDLCALC, TRIG, CHOLHDL, LDLDIRECT in the last 72 hours. Thyroid Function Tests:  Recent Labs  05/02/17 1309  TSH 4.874*   Anemia Panel: No results for input(s): VITAMINB12, FOLATE, FERRITIN, TIBC, IRON, RETICCTPCT in the last 72 hours. Urine analysis:      Component Value Date/Time   COLORURINE YELLOW 05/02/2017 1308   APPEARANCEUR HAZY (A) 05/02/2017 1308   LABSPEC 1.020 05/02/2017 1308   PHURINE 5.0 05/02/2017 1308   GLUCOSEU >=500 (A) 05/02/2017 1308   HGBUR SMALL (A) 05/02/2017 1308   BILIRUBINUR NEGATIVE 05/02/2017 1308   KETONESUR NEGATIVE 05/02/2017 1308   PROTEINUR 100 (A) 05/02/2017 1308   UROBILINOGEN 0.2 02/10/2013 1041   NITRITE POSITIVE (A) 05/02/2017 1308   LEUKOCYTESUR SMALL (A) 05/02/2017 1308   Sepsis Labs: _0 (procalcitonin:4,lacticidven:4)  )No results found for this or any previous visit (from the past 240 hour(s)).  Radiology Studies: Dg Chest 2 View  Result Date: 05/03/2017 CLINICAL DATA:  CHF exacerbation. EXAM: CHEST  2 VIEW COMPARISON:  05/02/2017 and prior exams FINDINGS: Cardiomegaly and mild pulmonary vascular congestion again noted. Mild lingular atelectasis now identified. There is no evidence of airspace disease, pleural effusion or pneumothorax. IMPRESSION: New mild lingular atelectasis without other significant change. Cardiomegaly and mild pulmonary vascular congestion again noted. Electronically Signed   By: Margarette Canada M.D.   On: 05/03/2017 07:36        Scheduled Meds: . aspirin EC  81 mg Oral QPM  . furosemide  40 mg Oral BID  . gabapentin  300 mg Oral TID  . heparin  5,000 Units Subcutaneous Q8H  . insulin aspart  0-15 Units Subcutaneous TID WC  . insulin aspart  0-5 Units Subcutaneous QHS  . insulin aspart  4 Units Subcutaneous TID WC  . insulin glargine  20 Units Subcutaneous QHS  . ipratropium-albuterol  3 mL Nebulization BID  . lisinopril  2.5 mg Oral Daily  . mometasone-formoterol  2 puff Inhalation BID  . rosuvastatin  40 mg Oral q1800  . sertraline  150 mg Oral Daily  . spironolactone  25 mg Oral Daily   Continuous Infusions:   LOS: 2 days    Time spent: 35 minutes. Greater than 50% of this time was spent in direct contact with the patient coordinating  care.     Lelon Frohlich, MD [facility] Pager [phone]  If 7PM-7AM, please contact night-coverage [email or web address] Password [facility] 05/04/2017, 3:33 PM

## 2017-05-04 NOTE — Care Management Note (Signed)
Case Management Note  Patient Details  Name: Amy Shepard MRN: [id number] Date of Birth: Oct 22, 1945  Subjective/Objective:      COPD             Action/Plan: 05/04/2017- patient known to me from previous admission; CM will continue to follow for DCPAneta Mins [phone]  03/15/2017 - Patient refused all [place] services per Edwin Cap RN CM  03/11/2017 - CM talked to patient at the bedside; PCP is Dr Maurice Small; has private insurance with Medicare A&B ( pt just recently received part B); she stated that she is working with Alinda Sierras at Dr [facility] office for further assistance; DME - has home oxygen, nebulizer machine, cane and walker at home;  CM following for DCP. Mindi Slicker RN,MHA,BSN  02/09/2017 - ED CM received consult from [place] on [place]. Patient presented to [facility] ED wth SOB. Patient reported to staff that she has been cutting back on her oxygen because she cannot afford to pay the bill. CM met with patient at bedside patient reports haviing Medicare Part A but does not have Part B that pays for outpatient services. Patient states, she recently stopped working and paperwork was not processed in time for Part B. patient has been without coverage for several months, she has also been without several of her medications as well. Patient states she now has forms completed to submit for Medicare Part B. Patient said she will have a family member take form to [facility] office ED evaluation still in progress. CM will continue to follow for transitional care planning. This patient will also benefit from [facility] Management, CM will place a [facility] referral. Wendi Maya RN CM  Expected Discharge Date:    05/08/2017             Expected Discharge Plan:   Home  Status of Service:   In progress  Sherrilyn Rist [phone] 05/04/2017, 10:46 AM

## 2017-05-04 NOTE — Progress Notes (Signed)
Nutrition Brief Note  Patient identified on the Malnutrition Screening Tool (MST) Report  Wt Readings from Last 15 Encounters:  05/04/17 169 lb 11.2 oz (77 kg)  04/02/17 178 lb 12.8 oz (81.1 kg)  03/15/17 182 lb 11.2 oz (82.9 kg)  02/13/17 189 lb 14.4 oz (86.1 kg)  09/11/16 171 lb (77.6 kg)  09/05/16 181 lb (82.1 kg)  07/29/16 184 lb 12.8 oz (83.8 kg)  07/23/16 178 lb 9.6 oz (81 kg)  07/21/16 177 lb 12.8 oz (80.6 kg)  07/16/16 186 lb 12.8 oz (84.7 kg)  07/01/16 185 lb (83.9 kg)  06/25/16 186 lb 6.4 oz (84.6 kg)  05/15/16 173 lb (78.5 kg)  03/28/16 172 lb (78 kg)  03/13/16 169 lb 4.8 oz (76.8 kg)    Body mass index is 32.06 kg/m. Patient meets criteria for obesity unspecified based on current BMI.  Pt acknowledges a 16 pound wt loss over the past several months but reports this was intentional wt loss (pt has made healthy modifications to her diet)  Current diet order is Carb Modified, patient is consuming approximately 100% of meals at this time. Appetite is good. Labs and medications reviewed.   No nutrition interventions warranted at this time. If nutrition issues arise, please consult RD.   Kerman Passey MS, RD, LDN [phone] Pager  [phone] Weekend/On-Call Pager

## 2017-05-04 NOTE — Progress Notes (Signed)
Inpatient Diabetes Program Recommendations  AACE/ADA: New Consensus Statement on Inpatient Glycemic Control (2015)  Target Ranges:  Prepandial:   less than 140 mg/dL      Peak postprandial:   less than 180 mg/dL (1-2 hours)      Critically ill patients:  140 - 180 mg/dL   Lab Results  Component Value Date   GLUCAP 280 (H) 05/04/2017   HGBA1C 14.4 (H) 05/02/2017    Review of Glycemic Control Results for Amy Shepard, KOOB (MRN [id number]) as of 05/04/2017 12:52  Ref. Range 05/04/2017 12:06  Glucose-Capillary Latest Ref Range: 65 - 99 mg/dL 280 (H)   Diabetes history: DM2 Outpatient Diabetes medications: Lantus 20 units daily Current orders for Inpatient glycemic control: Lantus 20 units qd + Novolog 4 units tid meal coverage + Novolog correction 0-15 units tid + 0-5  Inpatient Diabetes Program Recommendations:  Noted A1c increased since last admission to 14.4.  Will plan to speak to pt to verify patient is currently taking insulin. May consider to decrease cost of insulin on D/C: -Novolin 70/30 insulin mix from [facility] for $25 per vial.  Thank you, Nani Gasser. Kerrie Timm, RN, MSN, CDE  Diabetes Coordinator Inpatient Glycemic Control Team Team Pager [phone] (8am-5pm) 05/04/2017 12:54 PM

## 2017-05-05 LAB — GLUCOSE, CAPILLARY
GLUCOSE-CAPILLARY: 243 mg/dL — AB (ref 65–99)
GLUCOSE-CAPILLARY: 170 mg/dL — AB (ref 65–99)
Glucose-Capillary: 227 mg/dL — ABNORMAL HIGH (ref 65–99)
Glucose-Capillary: 245 mg/dL — ABNORMAL HIGH (ref 65–99)

## 2017-05-05 LAB — BASIC METABOLIC PANEL
Anion gap: 10 (ref 5–15)
BUN: 30 mg/dL — AB (ref 6–20)
CALCIUM: 8.6 mg/dL — AB (ref 8.9–10.3)
CHLORIDE: 95 mmol/L — AB (ref 101–111)
CO2: 31 mmol/L (ref 22–32)
CREATININE: 1.77 mg/dL — AB (ref 0.44–1.00)
GFR calc Af Amer: 32 mL/min — ABNORMAL LOW (ref >60)
GFR calc non Af Amer: 28 mL/min — ABNORMAL LOW (ref >60)
Glucose, Bld: 236 mg/dL — ABNORMAL HIGH (ref 65–99)
POTASSIUM: 3.6 mmol/L (ref 3.5–5.1)
Sodium: 136 mmol/L (ref 135–145)

## 2017-05-05 MED ORDER — INSULIN ASPART 100 UNIT/ML ~~LOC~~ SOLN
4.0000 [IU] | Freq: Three times a day (TID) | SUBCUTANEOUS | Status: DC
  Administered 2017-05-05 – 2017-05-08 (×10): 4 [IU] via SUBCUTANEOUS

## 2017-05-05 MED ORDER — INSULIN ASPART 100 UNIT/ML ~~LOC~~ SOLN
0.0000 [IU] | Freq: Three times a day (TID) | SUBCUTANEOUS | Status: DC
  Administered 2017-05-05 (×2): 5 [IU] via SUBCUTANEOUS
  Administered 2017-05-05: 8 [IU] via SUBCUTANEOUS
  Administered 2017-05-06: 3 [IU] via SUBCUTANEOUS
  Administered 2017-05-06: 8 [IU] via SUBCUTANEOUS
  Administered 2017-05-06: 3 [IU] via SUBCUTANEOUS
  Administered 2017-05-07: 5 [IU] via SUBCUTANEOUS
  Administered 2017-05-07: 3 [IU] via SUBCUTANEOUS
  Administered 2017-05-07: 8 [IU] via SUBCUTANEOUS
  Administered 2017-05-08: 11 [IU] via SUBCUTANEOUS

## 2017-05-05 NOTE — Progress Notes (Addendum)
PROGRESS NOTE    Amy Shepard  YFR:[id number] DOB: [birth year] DOA: 05/02/2017 PCP: Maurice Small, MD     Brief Narrative:  71 year old woman admitted from home on 10/20 due to shortness of breath. She is in the process of moving to [place] and her medications had been placed in storage and she has not been able to access them for the past 5 days. She was found to have acute CHF and admission was requested.   Assessment & Plan:   Active Problems:   Coronary artery disease involving native coronary artery   Chronic obstructive pulmonary disease ([name])   Type 2 diabetes mellitus with complication, with long-term current use of insulin ([name])   Depression   HTN (hypertension)   Acute on chronic respiratory failure ([name])   CKD (chronic kidney disease), stage III ([name])   Chronic diastolic CHF (congestive heart failure) ([name])   Dog bite   CHF exacerbation ([name])   Acute diastolic CHF (congestive heart failure) ([name])   Acute on chronic diastolic CHF -Echo from August 2018 shows an ejection fraction of 65-70% and grade 1 diastolic dysfunction without wall motion abnormalities. -She was admitted, placed on Lasix 60 mg IV twice a day.  -As of 10/22 was transitioned to PO lasix at her home dose. -Volume status is improved, but Cr with significant increase overnight. -On spironolactone. Not on beta blocker due to relative bradycardia and exercise intolerance while on beta blocker in the past. Hold lisinopril given rising Cr. -Has a chronic oxygen requirement, remains on baseline oxygen while in the hospital.  Elevated troponin -Flat, no chest pain. -Likely due to acute CHF, recent echo without wall motion abnormalities. -No further plans for cardiac workup at this point.  Chronic kidney disease stage II -Baseline Cr is around 1.2. -Is up to 1.7 today. -Will watch 1 more day in hopes that renal function decreases. -Hold lisinopril.  Hypertension -Controlled, continue current  medication.  Hyperlipidemia -Continue statin.  Type 2 diabetes -Uncontrolled. Hemoglobin A1c is 14.4. CBGs overnight [id number], Which are improved from past 24 hours. -Will leave lantus at 25 units (which is increased from 20 units) and SSI.    DVT prophylaxis: Subcutaneous heparin Code Status: Full Code Family Communication: Patient only Disposition Plan: home once renal function trending down; anticipate 24-48 hours  Consultants:   None  Procedures:   None  Antimicrobials:  Anti-infectives    Start     Dose/Rate Route Frequency Ordered Stop   05/02/17 1100  doxycycline (VIBRA-TABS) tablet 100 mg     100 mg Oral  Once 05/02/17 1054 05/02/17 1128       Subjective: Has no CP/SOB today.  Objective: Vitals:   05/04/17 1913 05/04/17 2036 05/05/17 0641 05/05/17 0831  BP:  (!) 91/49 111/69   Pulse: 62 83 84   Resp: _0 Temp:  (!) 97.5 F (36.4 C) (!) 97.4 F (36.3 C)   TempSrc:  Oral Oral   SpO2: 96% 94% 92% 96%  Weight:   79.1 kg (174 lb 4.8 oz)   Height:        Intake/Output Summary (Last 24 hours) at 05/05/17 0926 Last data filed at 05/05/17 0905  Gross per 24 hour  Intake              480 ml  Output              250 ml  Net  230 ml   Filed Weights   05/03/17 0851 05/04/17 0626 05/05/17 0641  Weight: 77.9 kg (171 lb 11.8 oz) 77 kg (169 lb 11.2 oz) 79.1 kg (174 lb 4.8 oz)    Examination:  General exam: Alert, awake, oriented x 3 Respiratory system: Clear to auscultation. Respiratory effort normal. Cardiovascular system:RRR. No murmurs, rubs, gallops. Gastrointestinal system: Abdomen is nondistended, soft and nontender. No organomegaly or masses felt. Normal bowel sounds heard. Central nervous system: Alert and oriented. No focal neurological deficits. Extremities: No C/C/E, +pedal pulses Skin: No rashes, lesions or ulcers Psychiatry: Judgement and insight appear normal. Mood & affect appropriate.        Data Reviewed: I  have personally reviewed following labs and imaging studies  CBC:  Recent Labs Lab 05/02/17 0756 05/03/17 0656  WBC 7.7 7.8  NEUTROABS 5.5  --   HGB 15.0 14.8  HCT 45.8 46.6*  MCV 81.2 81.3  PLT 168 235   Basic Metabolic Panel:  Recent Labs Lab 05/02/17 0756 05/02/17 2357 05/03/17 0656 05/04/17 0558 05/05/17 0456  NA 130*  --  137 133* 136  K 3.5 3.9 3.3* 3.2* 3.6  CL 94*  --  96* 92* 95*  CO2 24  --  33* 32 31  GLUCOSE 410*  --  178* 213* 236*  BUN 19  --  17 22* 30*  CREATININE 1.29*  --  1.20* 1.24* 1.77*  CALCIUM 8.8*  --  9.0 8.8* 8.6*   GFR: Estimated Creatinine Clearance: 28.2 mL/min (A) (by C-G formula based on SCr of 1.77 mg/dL (H)). Liver Function Tests:  Recent Labs Lab 05/02/17 0756 05/03/17 0656  AST 29 23  ALT 14 12*  ALKPHOS 129* 134*  BILITOT 0.9 0.7  PROT 6.4* 6.1*  ALBUMIN 3.2* 3.0*   No results for input(s): LIPASE, AMYLASE in the last 168 hours. No results for input(s): AMMONIA in the last 168 hours. Coagulation Profile: No results for input(s): INR, PROTIME in the last 168 hours. Cardiac Enzymes:  Recent Labs Lab 05/02/17 1307 05/02/17 1813 05/02/17 2357  TROPONINI 0.09* 0.09* 0.08*   BNP (last 3 results)  Recent Labs  07/21/16 1210 07/29/16 1159 09/05/16 1131  PROBNP 2,696* 3,611* 2,929*   HbA1C:  Recent Labs  05/02/17 1308  HGBA1C 14.4*   CBG:  Recent Labs Lab 05/04/17 0753 05/04/17 1206 05/04/17 1643 05/04/17 2030 05/05/17 0733  GLUCAP 202* 280* 126* 137* 243*   Lipid Profile: No results for input(s): CHOL, HDL, LDLCALC, TRIG, CHOLHDL, LDLDIRECT in the last 72 hours. Thyroid Function Tests:  Recent Labs  05/02/17 1309  TSH 4.874*   Anemia Panel: No results for input(s): VITAMINB12, FOLATE, FERRITIN, TIBC, IRON, RETICCTPCT in the last 72 hours. Urine analysis:    Component Value Date/Time   COLORURINE YELLOW 05/02/2017 1308   APPEARANCEUR HAZY (A) 05/02/2017 1308   LABSPEC 1.020 05/02/2017  1308   PHURINE 5.0 05/02/2017 1308   GLUCOSEU >=500 (A) 05/02/2017 1308   HGBUR SMALL (A) 05/02/2017 1308   BILIRUBINUR NEGATIVE 05/02/2017 1308   KETONESUR NEGATIVE 05/02/2017 1308   PROTEINUR 100 (A) 05/02/2017 1308   UROBILINOGEN 0.2 02/10/2013 1041   NITRITE POSITIVE (A) 05/02/2017 1308   LEUKOCYTESUR SMALL (A) 05/02/2017 1308   Sepsis Labs: _0 (procalcitonin:4,lacticidven:4)  )No results found for this or any previous visit (from the past 240 hour(s)).       Radiology Studies: No results found.      Scheduled Meds: . aspirin EC  81 mg Oral QPM  .  furosemide  40 mg Oral BID  . gabapentin  300 mg Oral TID  . heparin  5,000 Units Subcutaneous Q8H  . insulin aspart  0-15 Units Subcutaneous TID WC  . insulin aspart  0-5 Units Subcutaneous QHS  . insulin aspart  4 Units Subcutaneous TID WC  . insulin glargine  25 Units Subcutaneous QHS  . ipratropium-albuterol  3 mL Nebulization BID  . lisinopril  2.5 mg Oral Daily  . mometasone-formoterol  2 puff Inhalation BID  . rosuvastatin  40 mg Oral q1800  . sertraline  150 mg Oral Daily  . spironolactone  25 mg Oral Daily   Continuous Infusions:   LOS: 3 days    Time spent: 25 minutes. Greater than 50% of this time was spent in direct contact with the patient coordinating care.     Lelon Frohlich, MD [facility] Pager [phone]  If 7PM-7AM, please contact night-coverage [email or web address] Password [email or web address] 05/05/2017, 9:26 AM

## 2017-05-05 NOTE — Evaluation (Signed)
Physical Therapy Evaluation Patient Details Name: Amy Shepard MRN: [id number] DOB: [birth year] Today's Date: 05/05/2017   History of Present Illness  71 year old woman admitted from home on 10/20 due to shortness of breath. She is in the process of moving to [place] and her medications had been placed in storage and she has not been able to access them for the past 5 days. She was found to have acute CHF also being treated for elevated troponins, chronic kidney disease stage II, HTN, HLD, and DM2.   Clinical Impression  Pt admitted with above diagnosis. Pt currently with functional limitations due to the deficits listed below (see PT Problem List). PTA pt with ambulation was limited to household distances due to weakness. Pt is limited in mobility by oxygen desaturation and generalized weakness in LE. Pt currently minA for transfers and ambulation of 50 feet with RW. Pt will benefit from skilled PT to increase their independence and safety with mobility to allow discharge to the venue listed below.       Follow Up Recommendations SNF    Equipment Recommendations  Other (comment) (to be determined at next venue)       Precautions / Restrictions Precautions Precautions: Fall Restrictions Weight Bearing Restrictions: No      Mobility  Bed Mobility               General bed mobility comments: OOB in recliner at entry  Transfers Overall transfer level: Needs assistance Equipment used: Rolling walker (2 wheeled) Transfers: Sit to/from Stand Sit to Stand: Min assist         General transfer comment: minA for power up and steadying at RW, vc for hand placement and scoot to edge of chair  Ambulation/Gait Ambulation/Gait assistance: Min assist Ambulation Distance (Feet): 50 Feet (2x25', standing rest break ) Assistive device: Rolling walker (2 wheeled) Gait Pattern/deviations: Step-through pattern;Decreased stride length;Shuffle;Trunk flexed Gait velocity:  slowed Gait velocity interpretation: Below normal speed for age/gender General Gait Details: min guard to minA for steadying with ambulation, pt ambulated on 3 L O2 via nasal cannula, pt c/o increasing weakness with 25 feet of ambulation, pt oxygen had desaturated to 83%O2, vc for pursed lipped breathing, pt only able to increase SaO2 to 85%O2,  supplemental O2 increased to 4L and SaO2 only rose to 88%O2, pt stated she was feeling very weak supplemental O2 was increased to 6L via nasal cannula and SaO2 reached 90%O2, pt ambulated back to recliner and SaO2 had dropped back to 88%O2, with increased pursed lipped breathing SaO2 remained >95%O2, supplemental O2 decreased to 3L via nasal cannula and pt maintained SaO2 >90%O2         Balance Overall balance assessment: Needs assistance Sitting-balance support: Feet supported;No upper extremity supported Sitting balance-Leahy Scale: Good     Standing balance support: Bilateral upper extremity supported Standing balance-Leahy Scale: Poor Standing balance comment: requires RW to maintain balance                             Pertinent Vitals/Pain Pain Assessment: 0-10 Pain Score: 7  Pain Location: upper back and R shoulder Pain Descriptors / Indicators: Constant;Aching;Nagging Pain Intervention(s): Monitored during session;Limited activity within patient's tolerance    Home Living Family/patient expects to be discharged to:: Skilled nursing facility                      Prior Function Level of Independence: Needs assistance  Gait / Transfers Assistance Needed: limited household ambulation with RW   ADL's / Homemaking Assistance Needed: independent with bathing and dressing and iADLs  Comments: uses shower chair for bathing      Hand Dominance   Dominant Hand: Right    Extremity/Trunk Assessment   Upper Extremity Assessment Upper Extremity Assessment: RUE deficits/detail;LUE deficits/detail RUE Deficits /  Details: R shoulder pain unknown cause, lacking 20 degrees flexion/abduction, strength grossly 3/5 LUE Deficits / Details: hand ROM limited by edema from dog bite    Lower Extremity Assessment Lower Extremity Assessment: Generalized weakness    Cervical / Trunk Assessment Cervical / Trunk Assessment: Kyphotic  Communication   Communication: No difficulties  Cognition Arousal/Alertness: Lethargic Behavior During Therapy: WFL for tasks assessed/performed Overall Cognitive Status: Within Functional Limits for tasks assessed                                        General Comments General comments (skin integrity, edema, etc.): see ambulation notes for SaO2 levels, HR 68 bpm at rest, rose to 88 bpm with ambulation        Assessment/Plan    PT Assessment Patient needs continued PT services  PT Problem List Decreased strength;Decreased range of motion;Decreased activity tolerance;Decreased balance;Decreased mobility;Cardiopulmonary status limiting activity       PT Treatment Interventions DME instruction;Gait training;Functional mobility training;Therapeutic activities;Therapeutic exercise;Balance training;Patient/family education    PT Goals (Current goals can be found in the Care Plan section)  Acute Rehab PT Goals Patient Stated Goal: get stronger PT Goal Formulation: With patient Time For Goal Achievement: 05/19/17 Potential to Achieve Goals: Fair    Frequency Min 3X/week   Barriers to discharge Decreased caregiver support         AM-PAC PT "6 Clicks" Daily Activity  Outcome Measure Difficulty turning over in bed (including adjusting bedclothes, sheets and blankets)?: A Little Difficulty moving from lying on back to sitting on the side of the bed? : A Lot Difficulty sitting down on and standing up from a chair with arms (e.g., wheelchair, bedside commode, etc,.)?: Unable Help needed moving to and from a bed to chair (including a wheelchair)?: A  Little Help needed walking in hospital room?: A Little Help needed climbing 3-5 steps with a railing? : Total 6 Click Score: 13    End of Session Equipment Utilized During Treatment: Gait belt;Oxygen Activity Tolerance: Patient limited by fatigue Patient left: in chair;with call bell/phone within reach;with chair alarm set Nurse Communication: Mobility status;Other (comment) (oxygen desaturation with ambulation) PT Visit Diagnosis: Unsteadiness on feet (R26.81);Other abnormalities of gait and mobility (R26.89);Muscle weakness (generalized) (M62.81);Difficulty in walking, not elsewhere classified (R26.2);Pain Pain - part of body:  (back)    Time: 3317-4099 PT Time Calculation (min) (ACUTE ONLY): 27 min   Charges:   PT Evaluation $PT Eval Moderate Complexity: 1 Mod PT Treatments $Gait Training: 8-22 mins   PT G Codes:        Terrin Imparato B. Migdalia Dk PT, DPT Acute Rehabilitation  [phone] Pager [phone]    [place] 05/05/2017, 4:34 PM

## 2017-05-06 DIAGNOSIS — I2510 Atherosclerotic heart disease of native coronary artery without angina pectoris: Secondary | ICD-10-CM

## 2017-05-06 DIAGNOSIS — N183 Chronic kidney disease, stage 3 (moderate): Secondary | ICD-10-CM

## 2017-05-06 DIAGNOSIS — E118 Type 2 diabetes mellitus with unspecified complications: Secondary | ICD-10-CM

## 2017-05-06 DIAGNOSIS — Z794 Long term (current) use of insulin: Secondary | ICD-10-CM

## 2017-05-06 LAB — GLUCOSE, CAPILLARY
GLUCOSE-CAPILLARY: 153 mg/dL — AB (ref 65–99)
Glucose-Capillary: 179 mg/dL — ABNORMAL HIGH (ref 65–99)
Glucose-Capillary: 274 mg/dL — ABNORMAL HIGH (ref 65–99)
Glucose-Capillary: 217 mg/dL — ABNORMAL HIGH (ref 65–99)

## 2017-05-06 LAB — BASIC METABOLIC PANEL
Anion gap: 8 (ref 5–15)
BUN: 31 mg/dL — AB (ref 6–20)
CALCIUM: 8.8 mg/dL — AB (ref 8.9–10.3)
CO2: 32 mmol/L (ref 22–32)
Chloride: 97 mmol/L — ABNORMAL LOW (ref 101–111)
Creatinine, Ser: 1.32 mg/dL — ABNORMAL HIGH (ref 0.44–1.00)
GFR calc non Af Amer: 40 mL/min — ABNORMAL LOW (ref >60)
GFR, EST AFRICAN AMERICAN: 46 mL/min — AB (ref >60)
GLUCOSE: 200 mg/dL — AB (ref 65–99)
Potassium: 4 mmol/L (ref 3.5–5.1)
Sodium: 137 mmol/L (ref 135–145)

## 2017-05-06 LAB — HEMOGLOBIN AND HEMATOCRIT, BLOOD
HEMATOCRIT: 47.7 % — AB (ref 36.0–46.0)
HEMOGLOBIN: 14.6 g/dL (ref 12.0–15.0)

## 2017-05-06 NOTE — Clinical Social Work Note (Signed)
CSW provided patient with list of bed offers, low-income housing options, and community resources for [facility]. Patient asked about [name] on the bed offer list. She will see if her daughter will be able to do her paperwork. PASARR still pending.   Amy Shepard, [place]

## 2017-05-06 NOTE — Clinical Social Work Placement (Signed)
   CLINICAL SOCIAL WORK PLACEMENT  NOTE  Date:  05/06/2017  Patient Details  Name: Amy Shepard MRN: [id number] Date of Birth: [birth year]  Clinical Social Work is seeking post-discharge placement for this patient at the [place] level of care (*CSW will initial, date and re-position this form in  chart as items are completed):  Yes   Patient/family provided with [place] Work Department's list of facilities offering this level of care within the geographic area requested by the patient (or if unable, by the patient's family).  Yes   Patient/family informed of their freedom to choose among providers that offer the needed level of care, that participate in Medicare, Medicaid or managed care program needed by the patient, have an available bed and are willing to accept the patient.  Yes   Patient/family informed of [facility]'s ownership interest in [facility] and [facility], as well as of the fact that they are under no obligation to receive care at these facilities.  PASRR submitted to EDS on 05/06/17     PASRR number received on       Existing PASRR number confirmed on       FL2 transmitted to all facilities in geographic area requested by pt/family on 05/06/17     FL2 transmitted to all facilities within larger geographic area on       Patient informed that his/her managed care company has contracts with or will negotiate with certain facilities, including the following:            Patient/family informed of bed offers received.  Patient chooses bed at       Physician recommends and patient chooses bed at      Patient to be transferred to   on  .  Patient to be transferred to facility by       Patient family notified on   of transfer.  Name of family member notified:        PHYSICIAN Please sign FL2     Additional Comment:    _______________________________________________ Candie Chroman, LCSW 05/06/2017, 12:15  PM

## 2017-05-06 NOTE — Progress Notes (Signed)
Patient ID: Amy Shepard, female   DOB: [birth year], 71 y.o.   MRN: [id number]  PROGRESS NOTE    Amy Shepard  KDX:[id number] DOB: [birth year] DOA: 05/02/2017 PCP: Maurice Small, MD   Brief Narrative:  71 year old female with history of CAD, COPD, chronic hypoxic respiratory failure on oxygen via nasal cannula 2.5 L/m, chronic kidney disease stage III, chronic diastolic CHF, diabetes mellitus type 2, depression, lung cancer presented on 05/02/2017 with worsening shortness of breath. She was admitted with CHF exacerbation and started on IV Lasix.  Assessment & Plan:   Active Problems:   Coronary artery disease involving native coronary artery   Chronic obstructive pulmonary disease ([name])   Type 2 diabetes mellitus with complication, with long-term current use of insulin ([name])   Depression   HTN (hypertension)   Acute on chronic respiratory failure ([name])   CKD (chronic kidney disease), stage III ([name])   Chronic diastolic CHF (congestive heart failure) ([name])   Dog bite   CHF exacerbation ([name])   Acute diastolic CHF (congestive heart failure) ([name])   Acute on chronic diastolic CHF -Echo from August 2018 shows an ejection fraction of 65-70% and grade 1 diastolic dysfunction without wall motion abnormalities. - Continue strict input and output and daily weights. Negative balance of 920 mL over the last 24 hours and 4380 ML since admission - Continue oral Lasix and spironolactone. -Volume status is improved; monitor creatinine - Not on beta blocker due to relative bradycardia and exercise intolerance while on beta blocker in the past. Hold lisinopril given rising Cr.  Elevated troponin -Flat, no chest pain. -Likely due to acute CHF, recent echo without wall motion abnormalities. -Outpatient follow-up with cardiology  Chronic kidney disease stage III -Baseline Cr is around 1.2. -Creatinine is 1.32 today. Repeat a.m. creatinine -Hold lisinopril.  Hypertension -Controlled,  continue Lasix and spironolactone  Hyperlipidemia -Continue statin.  Type 2 diabetes -Uncontrolled. Hemoglobin A1c is 14.4.  - Increase Lantus to 28 units at bedtime and add NovoLog 6 units 3 times a day with meals  Generalized deconditioning - PT recommends SNF placement  DVT prophylaxis: Heparin Code Status:  Full Family Communication: None at bedside Disposition Plan: Nursing home  Consultants: None  Procedures: None  Antimicrobials: None   Subjective: Patient seen and examined at bedside. She denies current chest pain, nausea or vomiting. She feels weak and tired.  Objective: Vitals:   05/06/17 0448 05/06/17 1001 05/06/17 1005 05/06/17 1242  BP: 113/67   122/64  Pulse: 74   70  Resp: 18   18  Temp: 97.8 F (36.6 C)   98 F (36.7 C)  TempSrc: Oral   Oral  SpO2: 92% 93% 94% 93%  Weight: 79.2 kg (174 lb 9.6 oz)     Height:        Intake/Output Summary (Last 24 hours) at 05/06/17 1504 Last data filed at 05/06/17 1425  Gross per 24 hour  Intake              960 ml  Output             2200 ml  Net            -1240 ml   Filed Weights   05/04/17 0626 05/05/17 0641 05/06/17 0448  Weight: 77 kg (169 lb 11.2 oz) 79.1 kg (174 lb 4.8 oz) 79.2 kg (174 lb 9.6 oz)    Examination:  General exam: Appears calm and comfortable  Respiratory system: Bilateral decreased breath sound at  bases  Cardiovascular system: S1 & S2 heard, rate controlled  Gastrointestinal system: Abdomen is nondistended, soft and nontender. Normal bowel sounds heard. Extremities: No cyanosis, clubbing; 1+ edema   Data Reviewed: I have personally reviewed following labs and imaging studies  CBC:  Recent Labs Lab 05/02/17 0756 05/03/17 0656  WBC 7.7 7.8  NEUTROABS 5.5  --   HGB 15.0 14.8  HCT 45.8 46.6*  MCV 81.2 81.3  PLT 168 198   Basic Metabolic Panel:  Recent Labs Lab 05/02/17 0756 05/02/17 2357 05/03/17 0656 05/04/17 0558 05/05/17 0456 05/06/17 0533  NA 130*  --  137  133* 136 137  K 3.5 3.9 3.3* 3.2* 3.6 4.0  CL 94*  --  96* 92* 95* 97*  CO2 24  --  33* 32 31 32  GLUCOSE 410*  --  178* 213* 236* 200*  BUN 19  --  17 22* 30* 31*  CREATININE 1.29*  --  1.20* 1.24* 1.77* 1.32*  CALCIUM 8.8*  --  9.0 8.8* 8.6* 8.8*   GFR: Estimated Creatinine Clearance: 37.8 mL/min (A) (by C-G formula based on SCr of 1.32 mg/dL (H)). Liver Function Tests:  Recent Labs Lab 05/02/17 0756 05/03/17 0656  AST 29 23  ALT 14 12*  ALKPHOS 129* 134*  BILITOT 0.9 0.7  PROT 6.4* 6.1*  ALBUMIN 3.2* 3.0*   No results for input(s): LIPASE, AMYLASE in the last 168 hours. No results for input(s): AMMONIA in the last 168 hours. Coagulation Profile: No results for input(s): INR, PROTIME in the last 168 hours. Cardiac Enzymes:  Recent Labs Lab 05/02/17 1307 05/02/17 1813 05/02/17 2357  TROPONINI 0.09* 0.09* 0.08*   BNP (last 3 results)  Recent Labs  07/21/16 1210 07/29/16 1159 09/05/16 1131  PROBNP 2,696* 3,611* 2,929*   HbA1C: No results for input(s): HGBA1C in the last 72 hours. CBG:  Recent Labs Lab 05/05/17 1109 05/05/17 1721 05/05/17 2057 05/06/17 0743 05/06/17 1126  GLUCAP 227* 245* 170* 179* 274*   Lipid Profile: No results for input(s): CHOL, HDL, LDLCALC, TRIG, CHOLHDL, LDLDIRECT in the last 72 hours. Thyroid Function Tests: No results for input(s): TSH, T4TOTAL, FREET4, T3FREE, THYROIDAB in the last 72 hours. Anemia Panel: No results for input(s): VITAMINB12, FOLATE, FERRITIN, TIBC, IRON, RETICCTPCT in the last 72 hours. Sepsis Labs: No results for input(s): PROCALCITON, LATICACIDVEN in the last 168 hours.  No results found for this or any previous visit (from the past 240 hour(s)).       Radiology Studies: No results found.      Scheduled Meds: . aspirin EC  81 mg Oral QPM  . furosemide  40 mg Oral BID  . gabapentin  300 mg Oral TID  . heparin  5,000 Units Subcutaneous Q8H  . insulin aspart  0-15 Units Subcutaneous TID WC    . insulin aspart  0-5 Units Subcutaneous QHS  . insulin aspart  4 Units Subcutaneous TID WC  . insulin glargine  25 Units Subcutaneous QHS  . ipratropium-albuterol  3 mL Nebulization BID  . mometasone-formoterol  2 puff Inhalation BID  . rosuvastatin  40 mg Oral q1800  . sertraline  150 mg Oral Daily  . spironolactone  25 mg Oral Daily   Continuous Infusions:   LOS: 4 days        Aline August, MD [facility] Pager [phone]  If 7PM-7AM, please contact night-coverage [email or web address] Password [email or web address] 05/06/2017, 3:04 PM

## 2017-05-06 NOTE — Clinical Social Work Note (Signed)
Clinical Social Work Assessment  Patient Details  Name: Amy Shepard MRN: [id number] Date of Birth: [birth year]  Date of referral:  05/06/17               Reason for consult:  Facility Placement, Discharge Planning                Permission sought to share information with:  Chartered certified accountant granted to share information::  Yes, Verbal Permission Granted  Name::        Agency::  SNF's  Relationship::     Contact Information:     Housing/Transportation Living arrangements for the past 2 months:  Single Family Home, Hotel/Motel Source of Information:  Patient, Medical Team Patient Interpreter Needed:  None Criminal Activity/Legal Involvement Pertinent to Current Situation/Hospitalization:  No - Comment as needed Significant Relationships:  Adult Children Lives with:  Self Do you feel safe going back to the place where you live?  Yes Need for family participation in patient care:  Yes (Comment)  Care giving concerns:  PT recommending SNF once medically stable for discharge.   Social Worker assessment / plan:  CSW met with patient. No supports at bedside. CSW introduced role and explained that PT recommendations would be discussed. Patient agreeable to SNF placement. Patient stated that as of Tuesday 10/16 she is homeless but has been staying in a motel. She was evicted after an argument with her landlord. CSW will provide list of low-income housing options in [place]. CSW provided SNF list for review. No further concerns. CSW encouraged patient to contact CSW as needed. CSW will continue to follow patient for support and facilitate discharge to SNF once medically stable. Patient's PASARR is under manual review and cannot go to SNF until obtained.  Employment status:  Retired Forensic scientist:  Medicare PT Recommendations:  [place] / Referral to community resources:  [place]  Patient/Family's  Response to care:  Patient agreeable to SNF placement. Patient's children supportive and involved in patient's care. Patient appreciated social work intervention.  Patient/Family's Understanding of and Emotional Response to Diagnosis, Current Treatment, and Prognosis:  Patient has a good understanding of the reason for admission and her need for rehab after discharge. Patient appears happy with hospital care.  Emotional Assessment Appearance:  Appears stated age Attitude/Demeanor/Rapport:  Other (Pleasant) Affect (typically observed):  Accepting, Appropriate, Calm, Pleasant Orientation:  Oriented to Self, Oriented to Place, Oriented to  Time, Oriented to Situation Alcohol / Substance use:  Never Used Psych involvement (Current and /or in the community):  No (Comment)  Discharge Needs  Concerns to be addressed:  Care Coordination Readmission within the last 30 days:  No Current discharge risk:  Dependent with Mobility, Lives alone Barriers to Discharge:  Awaiting State Approval Tour manager), Continued Medical Work up   Candie Chroman, LCSW 05/06/2017, 12:10 PM

## 2017-05-06 NOTE — Progress Notes (Signed)
Inpatient Diabetes Program Recommendations  AACE/ADA: New Consensus Statement on Inpatient Glycemic Control (2015)  Target Ranges:  Prepandial:   less than 140 mg/dL      Peak postprandial:   less than 180 mg/dL (1-2 hours)      Critically ill patients:  140 - 180 mg/dL   Results for Amy Shepard, Amy Shepard (MRN [id number]) as of 05/06/2017 12:01  Ref. Range 05/05/2017 11:09 05/05/2017 17:21 05/05/2017 20:57 05/06/2017 07:43 05/06/2017 11:26  Glucose-Capillary Latest Ref Range: 65 - 99 mg/dL 227 (H) 245 (H) 170 (H) 179 (H) 274 (H)   Review of Glycemic Control  Inpatient Diabetes Program Recommendations:    Glucose still elevated especially postprandially. Please consider Lantus to 28 units, Consider also increasing Novolog Meal coverage to 6 units tid.  Thanks,  Tama Headings RN, MSN, [facility] Inpatient Diabetes Coordinator Team Pager [phone] (8a-5p)

## 2017-05-06 NOTE — NC FL2 (Signed)
[place] LEVEL OF CARE SCREENING TOOL     IDENTIFICATION  Patient Name: Amy Shepard Birthdate: [birth year] Sex: female Admission Date (Current Location): 05/02/2017  [facility] and [place] Number:  Herbalist and Address:  [facility] [place] [place] [address], [place], [place] [address]      Provider Number: [id number]  Attending Physician Name and Address:  Aline August, MD  Relative Name and Phone Number:       Current Level of Care: Hospital Recommended Level of Care: [place] Prior Approval Number:    Date Approved/Denied:   PASRR Number: Manual review  Discharge Plan: SNF    Current Diagnoses: Patient Active Problem List   Diagnosis Date Noted  . Dog bite 05/02/2017  . CHF exacerbation ([place]) 05/02/2017  . Acute diastolic CHF (congestive heart failure) ([place]) 05/02/2017  . Chronic diastolic CHF (congestive heart failure) ([place]) 04/02/2017  . Cor pulmonale ([place]) 04/02/2017  . Acute on chronic respiratory failure ([place]) 03/10/2017  . CKD (chronic kidney disease), stage III ([place]) 03/10/2017  . Chronic pain 03/10/2017  . Lactic acidosis 02/10/2017  . Acute respiratory failure with hypoxia ([place]) 02/09/2017  . Acute on chronic diastolic CHF (congestive heart failure) ([place]) 06/23/2016  . HTN (hypertension) 06/23/2016  . Type 2 diabetes mellitus with complication, with long-term current use of insulin ([place]) 03/11/2016  . Depression 03/11/2016  . Chronic obstructive pulmonary disease ([place]) 03/10/2016  . S/P lumbar spinal fusion 02/22/2016  . Coronary artery disease involving native coronary artery 07/12/2014  . Pain in the chest   . Malignant neoplasm of lower lobe of right lung ([place]) 05/25/2014  . Lung cancer ([place]) 09/01/2012    Orientation RESPIRATION BLADDER Height & Weight     Self, Time, Situation, Place  O2 (Nasal Canula 3 L) Continent Weight: 174 lb 9.6 oz (79.2 kg) (scale b) Height:  5' 1" (154.9  cm)  BEHAVIORAL SYMPTOMS/MOOD NEUROLOGICAL BOWEL NUTRITION STATUS   (None)  (None) Continent Diet (Carb modified)  AMBULATORY STATUS COMMUNICATION OF NEEDS Skin   Limited Assist Verbally Skin abrasions, Bruising, Other (Comment) (Excoriated, Rash.)                       Personal Care Assistance Level of Assistance              Functional Limitations Info  Sight, Hearing, Speech Sight Info: Adequate Hearing Info: Adequate Speech Info: Adequate    SPECIAL CARE FACTORS FREQUENCY  PT (By licensed PT), Blood pressure     PT Frequency: 5 x week              Contractures Contractures Info: Not present    Additional Factors Info  Code Status, Allergies, Psychotropic Code Status Info: Full Allergies Info: Amoxicillin Psychotropic Info: Depression: Zoloft 150 mg PO daily.         Current Medications (05/06/2017):  This is the current hospital active medication list Current Facility-Administered Medications  Medication Dose Route Frequency Provider Last Rate Last Dose  . acetaminophen (TYLENOL) tablet 650 mg  650 mg Oral Q6H PRN Rondel Jumbo, PA-C       Or  . acetaminophen (TYLENOL) suppository 650 mg  650 mg Rectal Q6H PRN Rondel Jumbo, PA-C      . albuterol (PROVENTIL) (2.5 MG/3ML) 0.083% nebulizer solution 2.5 mg  2.5 mg Nebulization Q2H PRN Rondel Jumbo, PA-C      . aspirin EC tablet 81 mg  81 mg Oral QPM Rondel Jumbo, PA-C   81 mg at 05/05/17 1757  . bisacodyl (DULCOLAX) suppository 10 mg  10 mg Rectal Daily PRN Rondel Jumbo, PA-C      . furosemide (LASIX) tablet 40 mg  40 mg Oral BID Isaac Bliss, Rayford Halsted, MD   40 mg at 05/06/17 0830  . gabapentin (NEURONTIN) capsule 300 mg  300 mg Oral TID Rondel Jumbo, PA-C   300 mg at 05/06/17 0830  . guaiFENesin (MUCINEX) 12 hr tablet 600 mg  600 mg Oral BID PRN Rondel Jumbo, PA-C      . heparin injection 5,000 Units  5,000 Units Subcutaneous Q8H Rondel Jumbo, PA-C   5,000 Units at 05/06/17 4332   . HYDROcodone-acetaminophen (NORCO/VICODIN) 5-325 MG per tablet 1-2 tablet  1-2 tablet Oral Q4H PRN Rondel Jumbo, PA-C   1 tablet at 05/05/17 1808  . insulin aspart (novoLOG) injection 0-15 Units  0-15 Units Subcutaneous TID WC Isaac Bliss, Rayford Halsted, MD   3 Units at 05/06/17 0827  . insulin aspart (novoLOG) injection 0-5 Units  0-5 Units Subcutaneous QHS Isaac Bliss, Rayford Halsted, MD   3 Units at 05/03/17 2149  . insulin aspart (novoLOG) injection 4 Units  4 Units Subcutaneous TID WC Isaac Bliss, Rayford Halsted, MD   4 Units at 05/06/17 0827  . insulin glargine (LANTUS) injection 25 Units  25 Units Subcutaneous QHS Isaac Bliss, Rayford Halsted, MD   25 Units at 05/05/17 2202  . ipratropium-albuterol (DUONEB) 0.5-2.5 (3) MG/3ML nebulizer solution 3 mL  3 mL Nebulization BID Isaac Bliss, Rayford Halsted, MD   3 mL at 05/06/17 0958  . mometasone-formoterol (DULERA) 200-5 MCG/ACT inhaler 2 puff  2 puff Inhalation BID Rondel Jumbo, PA-C   2 puff at 05/06/17 9518  . ondansetron (ZOFRAN) tablet 4 mg  4 mg Oral Q6H PRN Rondel Jumbo, PA-C       Or  . ondansetron [facility]) injection 4 mg  4 mg Intravenous Q6H PRN Rondel Jumbo, PA-C      . rosuvastatin (CRESTOR) tablet 40 mg  40 mg Oral q1800 Rondel Jumbo, PA-C   40 mg at 05/05/17 1758  . senna-docusate (Senokot-S) tablet 1 tablet  1 tablet Oral QHS PRN Rondel Jumbo, PA-C      . sertraline (ZOLOFT) tablet 150 mg  150 mg Oral Daily Rondel Jumbo, PA-C   150 mg at 05/06/17 0830  . spironolactone (ALDACTONE) tablet 25 mg  25 mg Oral Daily Rondel Jumbo, PA-C   25 mg at 05/06/17 0830   Facility-Administered Medications Ordered in Other Encounters  Medication Dose Route Frequency Provider Last Rate Last Dose  . diatrizoate meglumine-sodium (GASTROGRAFIN) 66-10 % solution 30 mL  30 mL Oral PRN Bjorn Loser, MD   30 mL at 11/30/15 0820     Discharge Medications: Please see discharge summary for a list of discharge  medications.  Relevant Imaging Results:  Relevant Lab Results:   Additional Information SS#: [id number]. Recently homeless as of 10/16 after argument with her landlord. Has been staying in a motel. Patient showed me Medicare card and she has A and B.  Candie Chroman, LCSW

## 2017-05-07 DIAGNOSIS — I10 Essential (primary) hypertension: Secondary | ICD-10-CM

## 2017-05-07 LAB — CBC WITH DIFFERENTIAL/PLATELET
BASOS ABS: 0 10*3/uL (ref 0.0–0.1)
BASOS PCT: 0 %
EOS ABS: 0.4 10*3/uL (ref 0.0–0.7)
EOS PCT: 5 %
HCT: 46.5 % — ABNORMAL HIGH (ref 36.0–46.0)
Hemoglobin: 14.5 g/dL (ref 12.0–15.0)
Lymphocytes Relative: 14 %
Lymphs Abs: 1.1 10*3/uL (ref 0.7–4.0)
MCH: 25.7 pg — ABNORMAL LOW (ref 26.0–34.0)
MCHC: 31.2 g/dL (ref 30.0–36.0)
MCV: 82.3 fL (ref 78.0–100.0)
MONO ABS: 0.8 10*3/uL (ref 0.1–1.0)
MONOS PCT: 11 %
Neutro Abs: 5.7 10*3/uL (ref 1.7–7.7)
Neutrophils Relative %: 71 %
PLATELETS: 123 10*3/uL — AB (ref 150–400)
RBC: 5.65 MIL/uL — ABNORMAL HIGH (ref 3.87–5.11)
RDW: 17.1 % — AB (ref 11.5–15.5)
WBC: 8 10*3/uL (ref 4.0–10.5)

## 2017-05-07 LAB — BASIC METABOLIC PANEL
ANION GAP: 8 (ref 5–15)
BUN: 26 mg/dL — AB (ref 6–20)
CALCIUM: 9.3 mg/dL (ref 8.9–10.3)
CO2: 35 mmol/L — AB (ref 22–32)
CREATININE: 1.16 mg/dL — AB (ref 0.44–1.00)
Chloride: 91 mmol/L — ABNORMAL LOW (ref 101–111)
GFR calc Af Amer: 54 mL/min — ABNORMAL LOW (ref >60)
GFR, EST NON AFRICAN AMERICAN: 47 mL/min — AB (ref >60)
GLUCOSE: 235 mg/dL — AB (ref 65–99)
Potassium: 4.3 mmol/L (ref 3.5–5.1)
Sodium: 134 mmol/L — ABNORMAL LOW (ref 135–145)

## 2017-05-07 LAB — GLUCOSE, CAPILLARY
GLUCOSE-CAPILLARY: 190 mg/dL — AB (ref 65–99)
Glucose-Capillary: 206 mg/dL — ABNORMAL HIGH (ref 65–99)
Glucose-Capillary: 278 mg/dL — ABNORMAL HIGH (ref 65–99)
Glucose-Capillary: 180 mg/dL — ABNORMAL HIGH (ref 65–99)

## 2017-05-07 LAB — MAGNESIUM: Magnesium: 1.7 mg/dL (ref 1.7–2.4)

## 2017-05-07 MED ORDER — INSULIN ASPART 100 UNIT/ML ~~LOC~~ SOLN: 4.0000 [IU] | Freq: Three times a day (TID) | SUBCUTANEOUS | 0 refills | Status: DC

## 2017-05-07 MED ORDER — GUAIFENESIN ER 600 MG PO TB12: 600.0000 mg | ORAL_TABLET | Freq: Two times a day (BID) | ORAL | 0 refills | Status: DC | PRN

## 2017-05-07 MED ORDER — INSULIN GLARGINE 100 UNIT/ML ~~LOC~~ SOLN: 25.0000 [IU] | Freq: Every day | SUBCUTANEOUS | Status: DC

## 2017-05-07 NOTE — Progress Notes (Signed)
Physical Therapy Treatment Patient Details Name: Amy Shepard MRN: [id number] DOB: [birth year] Today's Date: 05/07/2017    History of Present Illness 71 year old woman admitted from home on 10/20 due to shortness of breath. She is in the process of moving to [place] and her medications had been placed in storage and she has not been able to access them for the past 5 days. She was found to have acute CHF also being treated for elevated troponins, chronic kidney disease stage II, HTN, HLD, and DM2.     PT Comments    Patient is making progress toward mobility goals and tolerated increased gait distance this session. Pt with SpO2 desat to 79% on 3L O2 via [place] while ambulating. Pt educated on pursed lip breathing throughout session. Continue to progress as tolerated with anticipated d/c to SNF for further skilled PT services.     Follow Up Recommendations  SNF     Equipment Recommendations  Other (comment) (to be determined at next venue)    Recommendations for Other Services       Precautions / Restrictions Precautions Precautions: Fall Restrictions Weight Bearing Restrictions: No    Mobility  Bed Mobility               General bed mobility comments: pt OOB in chair upon arrival  Transfers Overall transfer level: Needs assistance Equipment used: Rolling walker (2 wheeled) Transfers: Sit to/from Stand Sit to Stand: Min guard         General transfer comment: min guard for safety  Ambulation/Gait Ambulation/Gait assistance: Min guard;Supervision Ambulation Distance (Feet): 140 Feet (2 rest breaks) Assistive device: Rolling walker (2 wheeled) Gait Pattern/deviations: Step-through pattern;Trunk flexed;Decreased stride length;Decreased dorsiflexion - right;Decreased dorsiflexion - left Gait velocity: decreased   General Gait Details: cues for pursed lip breathing, cadence, bilat heel strike, and posture; pt required 2 standing rest breaks due to fatigue; SpO2  desat to 79% on 3L O2 via [place] while ambulating   Stairs            Wheelchair Mobility    Modified Rankin (Stroke Patients Only)       Balance Overall balance assessment: Needs assistance Sitting-balance support: Feet supported;No upper extremity supported Sitting balance-Leahy Scale: Good     Standing balance support: Bilateral upper extremity supported Standing balance-Leahy Scale: Fair                              Cognition Arousal/Alertness: Awake/alert Behavior During Therapy: WFL for tasks assessed/performed Overall Cognitive Status: Within Functional Limits for tasks assessed                                        Exercises      General Comments        Pertinent Vitals/Pain Pain Assessment: Faces Faces Pain Scale: Hurts little more Pain Location: back Pain Descriptors / Indicators: Aching Pain Intervention(s): Limited activity within patient's tolerance;Monitored during session;Repositioned    Home Living                      Prior Function            PT Goals (current goals can now be found in the care plan section) Acute Rehab PT Goals Patient Stated Goal: get stronger PT Goal Formulation: With patient Time For Goal Achievement: 05/19/17 Potential  to Achieve Goals: Fair Progress towards PT goals: Progressing toward goals    Frequency    Min 3X/week      PT Plan Current plan remains appropriate    Co-evaluation              AM-PAC PT "6 Clicks" Daily Activity  Outcome Measure  Difficulty turning over in bed (including adjusting bedclothes, sheets and blankets)?: A Little Difficulty moving from lying on back to sitting on the side of the bed? : A Lot Difficulty sitting down on and standing up from a chair with arms (e.g., wheelchair, bedside commode, etc,.)?: A Lot Help needed moving to and from a bed to chair (including a wheelchair)?: A Little Help needed walking in hospital room?: A  Little Help needed climbing 3-5 steps with a railing? : Total 6 Click Score: 14    End of Session Equipment Utilized During Treatment: Gait belt;Oxygen Activity Tolerance: Patient tolerated treatment well Patient left: in chair;with call bell/phone within reach Nurse Communication: Mobility status PT Visit Diagnosis: Unsteadiness on feet (R26.81);Other abnormalities of gait and mobility (R26.89);Muscle weakness (generalized) (M62.81);Difficulty in walking, not elsewhere classified (R26.2);Pain Pain - part of body:  (back)     Time: 1420-1443 PT Time Calculation (min) (ACUTE ONLY): 23 min  Charges:  $Gait Training: 8-22 mins $Therapeutic Activity: 8-22 mins                    G Codes:       Earney Navy, PTA Pager: [phone]     Darliss Cheney 05/07/2017, 3:44 PM

## 2017-05-07 NOTE — Discharge Summary (Addendum)
Physician Discharge Summary  Amy Shepard FGH:[id number] DOB: November 06, 1945 DOA: 05/02/2017  PCP: Maurice Small, MD  Admit date: 05/02/2017 Discharge date: 05/07/2017  Admitted From: Home Disposition:  SNF  Recommendations for Outpatient Follow-up:  1. Follow up with provider at SNF at earliest convenience with repeat BMP 2. Follow-up with cardiology/ [name] in 1-2 weeks 3. Follow up in the ED if symptoms worsen or new appear.   Home Health: No  Equipment/Devices: oxygen via nasal cannula 2.5-3 L/m  Discharge Condition: stable CODE STATUS: full Diet recommendation: Heart Healthy / Carb Modified  Brief/Interim Summary: 71 year old female with history of CAD, COPD, chronic hypoxic respiratory failure on oxygen via nasal cannula 2.5 L/m, chronic kidney disease stage III, chronic diastolic CHF, diabetes mellitus type 2, depression, lung cancer presented on 05/02/2017 with worsening shortness of breath. She was admitted with CHF exacerbation and started on IV Lasix. She diuresed well and was switched to oral Lasix. She was very deconditioned and physical therapy recommend nursing home placement. She'll be discharged to nursing home once bed is available. Patient will benefit from outpatient follow-up with cardiology  Addendum: Patient wasn't discharged to SNF on 05/07/17 for logistic reasons. She will be discharged to SNF once arrangements have been made.  Discharge Diagnoses:  Active Problems:   Coronary artery disease involving native coronary artery   Chronic obstructive pulmonary disease ([name])   Type 2 diabetes mellitus with complication, with long-term current use of insulin ([name])   Depression   HTN (hypertension)   Acute on chronic respiratory failure ([name])   CKD (chronic kidney disease), stage III ([name])   Chronic diastolic CHF (congestive heart failure) ([name])   Dog bite   CHF exacerbation ([name])   Acute diastolic CHF (congestive heart failure) ([name])   Acute on chronic  diastolic CHF -Echo from August 2018 shows an ejection fraction of 65-70% and grade 1 diastolic dysfunction without wall motion abnormalities. - Continue strict input and output and daily weights. Negative balance of 1360 mL over the last 24 hours and 5740 ML since admission - Continue oral Lasix and spironolactone. -Volume status is improved; monitor creatinine has an outpatient - Not on beta blocker due to relative bradycardia and exercise intolerance while on beta blocker in the past. Hold lisinopril because of renal function. -Outpatient follow-up with cardiology  Elevated troponin -Flat, no chest pain. -Likely due to acute CHF, recent echo without wall motion abnormalities. -Outpatient follow-up with cardiology  Chronic kidney disease stage III -Baseline Cr is around 1.2. -Creatinine is 1.16 today.  - outpatient follow-up  chronic hypoxic respiratory failure - continue oxygen supplementation  Hypertension -Controlled, continue Lasix and spironolactone  Hyperlipidemia -Continue statin.  Type 2 diabetes -Uncontrolled. Hemoglobin A1c is 14.4.  - Outpatient follow-up. Continue Lantus and NovoLog  Generalized deconditioning - continue physical therapy in the nursing home  Discharge Instructions  Discharge Instructions    (HEART FAILURE PATIENTS) Call MD:  Anytime you have any of the following symptoms: 1) 3 pound weight gain in 24 hours or 5 pounds in 1 week 2) shortness of breath, with or without a dry hacking cough 3) swelling in the hands, feet or stomach 4) if you have to sleep on extra pillows at night in order to breathe.    Complete by:  As directed    Ambulatory referral to Cardiology    Complete by:  As directed    Follow up in 1-2 weeks for CHF   Call MD for:  difficulty breathing, headache  or visual disturbances    Complete by:  As directed    Call MD for:  extreme fatigue    Complete by:  As directed    Call MD for:  hives    Complete by:  As directed     Call MD for:  persistant dizziness or light-headedness    Complete by:  As directed    Call MD for:  persistant nausea and vomiting    Complete by:  As directed    Call MD for:  severe uncontrolled pain    Complete by:  As directed    Call MD for:  temperature >100.4    Complete by:  As directed    Diet - low sodium heart healthy    Complete by:  As directed    Diet Carb Modified    Complete by:  As directed    Increase activity slowly    Complete by:  As directed      Allergies as of 05/07/2017      Reactions   Amoxicillin Anaphylaxis, Hives, Rash   Has patient had a PCN reaction causing immediate rash, facial/tongue/throat swelling, SOB or lightheadedness with hypotension: YES Positive reaction causing SEVERE RASH INVOLVING MUCUS MEMBRANES/SKIN NECROSIS: YES Reaction that required HOSPITALIZATION: YES Reaction occurring within the last 10 years: NO      Medication List    TAKE these medications   aspirin EC 81 MG tablet Take 81 mg by mouth every evening.   CLEAR EYES FOR DRY EYES OP Place 1 drop into both eyes 2 (two) times daily.   Fluticasone-Salmeterol 250-50 MCG/DOSE Aepb Commonly known as:  ADVAIR Inhale 1 puff into the lungs 2 (two) times daily.   furosemide 40 MG tablet Commonly known as:  LASIX Take 40 mg by mouth 2 (two) times daily.   gabapentin 300 MG capsule Commonly known as:  NEURONTIN Take 300 mg by mouth 3 (three) times daily.   guaiFENesin 600 MG 12 hr tablet Commonly known as:  MUCINEX Take 1 tablet (600 mg total) by mouth 2 (two) times daily as needed for cough or to loosen phlegm.   insulin aspart 100 UNIT/ML injection Commonly known as:  novoLOG Inject 4 Units into the skin 3 (three) times daily with meals.   insulin glargine 100 UNIT/ML injection Commonly known as:  LANTUS Inject 0.25 mLs (25 Units total) into the skin at bedtime. What changed:  how much to take   ipratropium-albuterol 0.5-2.5 (3) MG/3ML Soln Commonly known as:   DUONEB Take 3 mLs by nebulization every 4 (four) hours as needed (wheezing, Shortness of breath).   rosuvastatin 40 MG tablet Commonly known as:  CRESTOR Take 1 tablet (40 mg total) by mouth daily at 6 PM.   sertraline 100 MG tablet Commonly known as:  ZOLOFT Take 150 mg by mouth daily.   spironolactone 25 MG tablet Commonly known as:  ALDACTONE Take 1 tablet (25 mg total) by mouth daily.       Contact information for follow-up providers    Dorothy Spark, MD. Schedule an appointment as soon as possible for a visit.   Specialty:  Cardiology Why:  Follow up in 1-2 weeks.  Contact information: [address] [place] [address] [phone]            Contact information for after-discharge care    [facility] Follow up.   Specialty:  Skilled Nursing Facility Contact information: [phone] [facility] [phone]                 Allergies  Allergen Reactions  . Amoxicillin Anaphylaxis, Hives and Rash    Has patient had a PCN reaction causing immediate rash, facial/tongue/throat swelling, SOB or lightheadedness with hypotension: YES Positive reaction causing SEVERE RASH INVOLVING MUCUS MEMBRANES/SKIN NECROSIS: YES Reaction that required HOSPITALIZATION: YES Reaction occurring within the last 10 years: NO    Consultations:  none   Procedures/Studies: Dg Chest 2 View  Result Date: 05/03/2017 CLINICAL DATA:  CHF exacerbation. EXAM: CHEST  2 VIEW COMPARISON:  05/02/2017 and prior exams FINDINGS: Cardiomegaly and mild pulmonary vascular congestion again noted. Mild lingular atelectasis now identified. There is no evidence of airspace disease, pleural effusion or pneumothorax. IMPRESSION: New mild lingular atelectasis without other significant change. Cardiomegaly and mild pulmonary vascular congestion again noted. Electronically Signed   By: Margarette Canada M.D.   On:  05/03/2017 07:36   Dg Chest 2 View  Result Date: 05/02/2017 CLINICAL DATA:  Chest pain pain and shortness of breath for 1 week EXAM: CHEST  2 VIEW COMPARISON:  03/13/2017 FINDINGS: Cardiac shadow is mildly enlarged. Aortic calcifications are again seen. The lungs are well aerated bilaterally without focal infiltrate or sizable effusion. Postsurgical changes are noted in the right infrahilar region. No bony abnormality is seen. IMPRESSION: No acute abnormality noted. Electronically Signed   By: Inez Catalina M.D.   On: 05/02/2017 09:12   Dg Hand Complete Left  Result Date: 05/02/2017 CLINICAL DATA:  71 year old female with pain and swelling of the left hand after sustaining a dog bite. EXAM: LEFT HAND - COMPLETE 3+ VIEW COMPARISON:  None. FINDINGS: No evidence of acute fracture or malalignment. No radiopaque foreign body identified. Mild soft tissue swelling over the dorsal aspect of the hand. Mild ulnar minus variant at the wrist. 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 IMPRESSION: 1. Soft tissue swelling without evidence of underlying fracture or retained radiopaque foreign body. 2. 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 Electronically Signed   By: Jacqulynn Cadet M.D.   On: 05/02/2017 09:07     Subjective: Patient seen and examined at bedside. She denies any overnight fever, nausea or vomiting or chest pain  Discharge Exam: Vitals:   05/07/17 0802 05/07/17 1128  BP:  (!) 126/92  Pulse: 68 73  Resp: 17   Temp:    SpO2: 97% 96%   Vitals:   05/06/17 2052 05/07/17 0542 05/07/17 0802 05/07/17 1128  BP:  (!) 100/44  (!) 126/92  Pulse:  72 68 73  Resp:  20 17   Temp:  97.7 F (36.5 C)    TempSrc:  Oral    SpO2: 95% 96% 97% 96%  Weight:  78.7 kg (173 lb 6.4 oz)    Height:        General: Pt is alert, awake, not in acute distress Cardiovascular: rate controlled, S1/S2 +, no rubs, no gallops Respiratory: bilateral decreased breath sounds at  bases Abdominal: Soft, NT, ND, bowel sounds + Extremities: mild edema, no cyanosis    The results of significant diagnostics from this hospitalization (including imaging, microbiology, ancillary and laboratory) are listed below for reference.     Microbiology: No results found for this or any previous visit (from the past 240 hour(s)).   Labs: BNP (last 3 results)  Recent Labs  03/10/17 1119 03/15/17 0519 05/02/17 0756  BNP 914.5* 444.1* 4,431.5*   Basic Metabolic Panel:  Recent Labs Lab 05/03/17 0656 05/04/17 0558 05/05/17 0456 05/06/17 0533 05/07/17 0330  NA 137 133* 136 137 134*  K 3.3* 3.2* 3.6 4.0 4.3  CL 96* 92* 95* 97* 91*  CO2 33* 32 31 32 35*  GLUCOSE 178* 213* 236* 200* 235*  BUN 17 22* 30* 31* 26*  CREATININE 1.20* 1.24* 1.77* 1.32* 1.16*  CALCIUM 9.0 8.8* 8.6* 8.8* 9.3  MG  --   --   --   --  1.7   Liver Function Tests:  Recent Labs Lab 05/02/17 0756 05/03/17 0656  AST 29 23  ALT 14 12*  ALKPHOS 129* 134*  BILITOT 0.9 0.7  PROT 6.4* 6.1*  ALBUMIN 3.2* 3.0*   No results for input(s): LIPASE, AMYLASE in the last 168 hours. No results for input(s): AMMONIA in the last 168 hours. CBC:  Recent Labs Lab 05/02/17 0756 05/03/17 0656 05/06/17 1509 05/07/17 0330  WBC 7.7 7.8  --  8.0  NEUTROABS 5.5  --   --  5.7  HGB 15.0 14.8 14.6 14.5  HCT 45.8 46.6* 47.7* 46.5*  MCV 81.2 81.3  --  82.3  PLT 168 154  --  123*   Cardiac Enzymes:  Recent Labs Lab 05/02/17 1307 05/02/17 1813 05/02/17 2357  TROPONINI 0.09* 0.09* 0.08*   BNP: Invalid input(s): POCBNP CBG:  Recent Labs Lab 05/06/17 1126 05/06/17 1717 05/06/17 2129 05/07/17 0723 05/07/17 1156  GLUCAP 274* 153* 217* 190* 206*   D-Dimer No results for input(s): DDIMER in the last 72 hours. Hgb A1c No results for input(s): HGBA1C in the last 72 hours. Lipid Profile No results for input(s): CHOL, HDL, LDLCALC, TRIG, CHOLHDL, LDLDIRECT in the last 72 hours. Thyroid function  studies No results for input(s): TSH, T4TOTAL, T3FREE, THYROIDAB in the last 72 hours.  Invalid input(s): FREET3 Anemia work up No results for input(s): VITAMINB12, FOLATE, FERRITIN, TIBC, IRON, RETICCTPCT in the last 72 hours. Urinalysis    Component Value Date/Time   COLORURINE YELLOW 05/02/2017 1308   APPEARANCEUR HAZY (A) 05/02/2017 1308   LABSPEC 1.020 05/02/2017 1308   PHURINE 5.0 05/02/2017 1308   GLUCOSEU >=500 (A) 05/02/2017 1308   HGBUR SMALL (A) 05/02/2017 1308   BILIRUBINUR NEGATIVE 05/02/2017 1308   KETONESUR NEGATIVE 05/02/2017 1308   PROTEINUR 100 (A) 05/02/2017 1308   UROBILINOGEN 0.2 02/10/2013 1041   NITRITE POSITIVE (A) 05/02/2017 1308   LEUKOCYTESUR SMALL (A) 05/02/2017 1308   Sepsis Labs Invalid input(s): PROCALCITONIN,  WBC,  LACTICIDVEN Microbiology No results found for this or any previous visit (from the past 240 hour(s)).   Time coordinating discharge: 35 minutes  SIGNED:   Aline August, MD  [facility] 05/07/2017, 2:14 PM Pager: [phone]  If 7PM-7AM, please contact night-coverage [email or web address] Password [email or web address]

## 2017-05-07 NOTE — Clinical Social Work Note (Addendum)
Requested documentation faxed to [facility] for PASARR review. Patient has chosen [name] and will see if her daughter can go to the facility to complete the paperwork. [name] has a private room available.  Dayton Scrape, CSW [phone]  11:43 am CSW spoke with patient's daughter. She will go to [name] at 2:00 to complete paperwork. PASARR still pending.  Dayton Scrape, CSW [phone]  2:36 pm PASARR still pending.  Dayton Scrape, [place]  4:35 pm PASARR pending. [facility] did not receive faxed information. CSW attached electronically. Patient, RN, and SNF updated.  Dayton Scrape, [place]

## 2017-05-07 NOTE — Progress Notes (Signed)
Patient w/o complaint during 7 a to 7 p shift, up in chair majority of shift, walking to bathroom and back without problem.  Patient is O2 dependent at baseline.  Awaiting rehab placement.

## 2017-05-07 NOTE — Plan of Care (Signed)
Problem: Tissue Perfusion: Goal: Risk factors for ineffective tissue perfusion will decrease Outcome: Progressing O2 dependent at baseline

## 2017-05-08 DIAGNOSIS — I270 Primary pulmonary hypertension: Secondary | ICD-10-CM | POA: Diagnosis not present

## 2017-05-08 DIAGNOSIS — C3431 Malignant neoplasm of lower lobe, right bronchus or lung: Secondary | ICD-10-CM | POA: Diagnosis not present

## 2017-05-08 DIAGNOSIS — Z9981 Dependence on supplemental oxygen: Secondary | ICD-10-CM | POA: Diagnosis not present

## 2017-05-08 DIAGNOSIS — J189 Pneumonia, unspecified organism: Secondary | ICD-10-CM | POA: Diagnosis not present

## 2017-05-08 DIAGNOSIS — Z8249 Family history of ischemic heart disease and other diseases of the circulatory system: Secondary | ICD-10-CM | POA: Diagnosis not present

## 2017-05-08 DIAGNOSIS — E7800 Pure hypercholesterolemia, unspecified: Secondary | ICD-10-CM | POA: Diagnosis present

## 2017-05-08 DIAGNOSIS — M542 Cervicalgia: Secondary | ICD-10-CM | POA: Diagnosis not present

## 2017-05-08 DIAGNOSIS — Z87891 Personal history of nicotine dependence: Secondary | ICD-10-CM | POA: Diagnosis not present

## 2017-05-08 DIAGNOSIS — G9341 Metabolic encephalopathy: Secondary | ICD-10-CM | POA: Diagnosis not present

## 2017-05-08 DIAGNOSIS — Z833 Family history of diabetes mellitus: Secondary | ICD-10-CM | POA: Diagnosis not present

## 2017-05-08 DIAGNOSIS — M25562 Pain in left knee: Secondary | ICD-10-CM | POA: Diagnosis not present

## 2017-05-08 DIAGNOSIS — I2781 Cor pulmonale (chronic): Secondary | ICD-10-CM | POA: Diagnosis not present

## 2017-05-08 DIAGNOSIS — E873 Alkalosis: Secondary | ICD-10-CM | POA: Diagnosis not present

## 2017-05-08 DIAGNOSIS — M25572 Pain in left ankle and joints of left foot: Secondary | ICD-10-CM | POA: Diagnosis not present

## 2017-05-08 DIAGNOSIS — I509 Heart failure, unspecified: Secondary | ICD-10-CM | POA: Diagnosis not present

## 2017-05-08 DIAGNOSIS — I5040 Unspecified combined systolic (congestive) and diastolic (congestive) heart failure: Secondary | ICD-10-CM | POA: Diagnosis not present

## 2017-05-08 DIAGNOSIS — N390 Urinary tract infection, site not specified: Secondary | ICD-10-CM | POA: Diagnosis not present

## 2017-05-08 DIAGNOSIS — R531 Weakness: Secondary | ICD-10-CM | POA: Diagnosis not present

## 2017-05-08 DIAGNOSIS — F329 Major depressive disorder, single episode, unspecified: Secondary | ICD-10-CM | POA: Diagnosis not present

## 2017-05-08 DIAGNOSIS — A419 Sepsis, unspecified organism: Secondary | ICD-10-CM | POA: Diagnosis not present

## 2017-05-08 DIAGNOSIS — Z880 Allergy status to penicillin: Secondary | ICD-10-CM | POA: Diagnosis not present

## 2017-05-08 DIAGNOSIS — C3490 Malignant neoplasm of unspecified part of unspecified bronchus or lung: Secondary | ICD-10-CM | POA: Diagnosis not present

## 2017-05-08 DIAGNOSIS — R278 Other lack of coordination: Secondary | ICD-10-CM | POA: Diagnosis not present

## 2017-05-08 DIAGNOSIS — R05 Cough: Secondary | ICD-10-CM | POA: Diagnosis not present

## 2017-05-08 DIAGNOSIS — I2510 Atherosclerotic heart disease of native coronary artery without angina pectoris: Secondary | ICD-10-CM | POA: Diagnosis not present

## 2017-05-08 DIAGNOSIS — Z79899 Other long term (current) drug therapy: Secondary | ICD-10-CM | POA: Diagnosis not present

## 2017-05-08 DIAGNOSIS — W19XXXA Unspecified fall, initial encounter: Secondary | ICD-10-CM | POA: Diagnosis not present

## 2017-05-08 DIAGNOSIS — I5033 Acute on chronic diastolic (congestive) heart failure: Secondary | ICD-10-CM | POA: Diagnosis not present

## 2017-05-08 DIAGNOSIS — I248 Other forms of acute ischemic heart disease: Secondary | ICD-10-CM | POA: Diagnosis not present

## 2017-05-08 DIAGNOSIS — J9611 Chronic respiratory failure with hypoxia: Secondary | ICD-10-CM | POA: Diagnosis not present

## 2017-05-08 DIAGNOSIS — M6281 Muscle weakness (generalized): Secondary | ICD-10-CM | POA: Diagnosis not present

## 2017-05-08 DIAGNOSIS — I5032 Chronic diastolic (congestive) heart failure: Secondary | ICD-10-CM | POA: Diagnosis not present

## 2017-05-08 DIAGNOSIS — S92312D Displaced fracture of first metatarsal bone, left foot, subsequent encounter for fracture with routine healing: Secondary | ICD-10-CM | POA: Diagnosis not present

## 2017-05-08 DIAGNOSIS — Z7982 Long term (current) use of aspirin: Secondary | ICD-10-CM | POA: Diagnosis not present

## 2017-05-08 DIAGNOSIS — R0683 Snoring: Secondary | ICD-10-CM | POA: Diagnosis not present

## 2017-05-08 DIAGNOSIS — E876 Hypokalemia: Secondary | ICD-10-CM | POA: Diagnosis not present

## 2017-05-08 DIAGNOSIS — E1122 Type 2 diabetes mellitus with diabetic chronic kidney disease: Secondary | ICD-10-CM | POA: Diagnosis present

## 2017-05-08 DIAGNOSIS — W19XXXD Unspecified fall, subsequent encounter: Secondary | ICD-10-CM | POA: Diagnosis not present

## 2017-05-08 DIAGNOSIS — I130 Hypertensive heart and chronic kidney disease with heart failure and stage 1 through stage 4 chronic kidney disease, or unspecified chronic kidney disease: Secondary | ICD-10-CM | POA: Diagnosis not present

## 2017-05-08 DIAGNOSIS — I272 Pulmonary hypertension, unspecified: Secondary | ICD-10-CM | POA: Diagnosis not present

## 2017-05-08 DIAGNOSIS — E785 Hyperlipidemia, unspecified: Secondary | ICD-10-CM | POA: Diagnosis not present

## 2017-05-08 DIAGNOSIS — Z7951 Long term (current) use of inhaled steroids: Secondary | ICD-10-CM | POA: Diagnosis not present

## 2017-05-08 DIAGNOSIS — Z85118 Personal history of other malignant neoplasm of bronchus and lung: Secondary | ICD-10-CM | POA: Diagnosis not present

## 2017-05-08 DIAGNOSIS — I10 Essential (primary) hypertension: Secondary | ICD-10-CM | POA: Diagnosis not present

## 2017-05-08 DIAGNOSIS — J449 Chronic obstructive pulmonary disease, unspecified: Secondary | ICD-10-CM | POA: Diagnosis not present

## 2017-05-08 DIAGNOSIS — S0990XA Unspecified injury of head, initial encounter: Secondary | ICD-10-CM | POA: Diagnosis not present

## 2017-05-08 DIAGNOSIS — R2689 Other abnormalities of gait and mobility: Secondary | ICD-10-CM | POA: Diagnosis not present

## 2017-05-08 DIAGNOSIS — E118 Type 2 diabetes mellitus with unspecified complications: Secondary | ICD-10-CM | POA: Diagnosis not present

## 2017-05-08 DIAGNOSIS — R9431 Abnormal electrocardiogram [ECG] [EKG]: Secondary | ICD-10-CM | POA: Diagnosis not present

## 2017-05-08 DIAGNOSIS — R296 Repeated falls: Secondary | ICD-10-CM | POA: Diagnosis present

## 2017-05-08 DIAGNOSIS — E119 Type 2 diabetes mellitus without complications: Secondary | ICD-10-CM | POA: Diagnosis not present

## 2017-05-08 DIAGNOSIS — T148XXA Other injury of unspecified body region, initial encounter: Secondary | ICD-10-CM | POA: Diagnosis not present

## 2017-05-08 DIAGNOSIS — Z794 Long term (current) use of insulin: Secondary | ICD-10-CM | POA: Diagnosis not present

## 2017-05-08 DIAGNOSIS — M79672 Pain in left foot: Secondary | ICD-10-CM | POA: Diagnosis not present

## 2017-05-08 DIAGNOSIS — R410 Disorientation, unspecified: Secondary | ICD-10-CM | POA: Diagnosis not present

## 2017-05-08 DIAGNOSIS — N183 Chronic kidney disease, stage 3 (moderate): Secondary | ICD-10-CM | POA: Diagnosis not present

## 2017-05-08 DIAGNOSIS — J440 Chronic obstructive pulmonary disease with acute lower respiratory infection: Secondary | ICD-10-CM | POA: Diagnosis not present

## 2017-05-08 DIAGNOSIS — S92512D Displaced fracture of proximal phalanx of left lesser toe(s), subsequent encounter for fracture with routine healing: Secondary | ICD-10-CM | POA: Diagnosis not present

## 2017-05-08 LAB — BASIC METABOLIC PANEL
Anion gap: 11 (ref 5–15)
BUN: 31 mg/dL — AB (ref 6–20)
CO2: 35 mmol/L — ABNORMAL HIGH (ref 22–32)
CREATININE: 1.46 mg/dL — AB (ref 0.44–1.00)
Calcium: 9.7 mg/dL (ref 8.9–10.3)
Chloride: 89 mmol/L — ABNORMAL LOW (ref 101–111)
GFR calc Af Amer: 41 mL/min — ABNORMAL LOW (ref >60)
GFR, EST NON AFRICAN AMERICAN: 35 mL/min — AB (ref >60)
GLUCOSE: 301 mg/dL — AB (ref 65–99)
POTASSIUM: 4 mmol/L (ref 3.5–5.1)
SODIUM: 135 mmol/L (ref 135–145)

## 2017-05-08 LAB — GLUCOSE, CAPILLARY: Glucose-Capillary: 302 mg/dL — ABNORMAL HIGH (ref 65–99)

## 2017-05-08 LAB — MAGNESIUM: MAGNESIUM: 1.8 mg/dL (ref 1.7–2.4)

## 2017-05-08 NOTE — Clinical Social Work Note (Signed)
CSW facilitated patient discharge including contacting patient family (daughter Clementeen Graham) and facility to confirm patient discharge plans. Clinical information faxed to facility and family agreeable with plan. CSW arranged ambulance transport via [facility] to [name] at 11:00 am. RN to call report prior to discharge [phone]).  CSW will sign off for now as social work intervention is no longer needed. Please consult [place] again if new needs arise.  Dayton Scrape, [place]

## 2017-05-08 NOTE — Progress Notes (Signed)
Report called and given to nurse at [facility], patient is alert and oriented, vital signs are stable, iv removed Neta Mends RN 10:48 AM 05-08-2017

## 2017-05-08 NOTE — Clinical Social Work Placement (Signed)
   CLINICAL SOCIAL WORK PLACEMENT  NOTE  Date:  05/08/2017  Patient Details  Name: Amy Shepard MRN: [id number] Date of Birth: June 15, 1946  Clinical Social Work is seeking post-discharge placement for this patient at the [facility] level of care (*CSW will initial, date and re-position this form in  chart as items are completed):  Yes   Patient/family provided with [facility] Work Department's list of facilities offering this level of care within the geographic area requested by the patient (or if unable, by the patient's family).  Yes   Patient/family informed of their freedom to choose among providers that offer the needed level of care, that participate in Medicare, Medicaid or managed care program needed by the patient, have an available bed and are willing to accept the patient.  Yes   Patient/family informed of [facility]'s ownership interest in [facility] and [facility], as well as of the fact that they are under no obligation to receive care at these facilities.  PASRR submitted to EDS on 05/06/17     PASRR number received on 05/08/17     Existing PASRR number confirmed on       FL2 transmitted to all facilities in geographic area requested by pt/family on 05/06/17     FL2 transmitted to all facilities within larger geographic area on       Patient informed that his/her managed care company has contracts with or will negotiate with certain facilities, including the following:        Yes   Patient/family informed of bed offers received.  Patient chooses bed at [facility]     Physician recommends and patient chooses bed at      Patient to be transferred to [facility] on 05/08/17.  Patient to be transferred to facility by [facility]     Patient family notified on 05/08/17 of transfer.  Name of family member notified:  Konrad Penta     PHYSICIAN Please prepare prescriptions     Additional  Comment:    _______________________________________________ Candie Chroman, LCSW 05/08/2017, 9:44 AM

## 2017-05-08 NOTE — Clinical Social Work Note (Signed)
PASARR obtained: [id number] A. SNF notified and just needs discharge date to be changed to today in discharge summary. CSW paged MD to notify.  Dayton Scrape, [place]

## 2017-05-08 NOTE — Progress Notes (Signed)
Patient ID: Amy Shepard, female   DOB: 1946/07/08, 71 y.o.   MRN: [id number] Patient wasn't discharged to SNF on 05/07/17 for logistic reasons. She will be discharged to SNF once arrangements have been made.

## 2017-05-11 ENCOUNTER — Other Ambulatory Visit (HOSPITAL_COMMUNITY): Payer: Self-pay

## 2017-05-11 ENCOUNTER — Ambulatory Visit (HOSPITAL_COMMUNITY)
Admission: RE | Admit: 2017-05-11 | Discharge: 2017-05-11 | Disposition: A | Discharge status: Home / Self Care | Payer: Medicare Other | Source: Ambulatory Visit | Attending: Cardiology | Admitting: Cardiology

## 2017-05-11 ENCOUNTER — Encounter (HOSPITAL_COMMUNITY): Payer: Self-pay

## 2017-05-11 ENCOUNTER — Encounter (HOSPITAL_COMMUNITY): Payer: Self-pay | Admitting: Cardiology

## 2017-05-11 VITALS — BP 104/59 | HR 74 | Wt 170.2 lb

## 2017-05-11 DIAGNOSIS — I2510 Atherosclerotic heart disease of native coronary artery without angina pectoris: Secondary | ICD-10-CM | POA: Insufficient documentation

## 2017-05-11 DIAGNOSIS — N183 Chronic kidney disease, stage 3 unspecified: Secondary | ICD-10-CM

## 2017-05-11 DIAGNOSIS — Z85118 Personal history of other malignant neoplasm of bronchus and lung: Secondary | ICD-10-CM | POA: Insufficient documentation

## 2017-05-11 DIAGNOSIS — Z79899 Other long term (current) drug therapy: Secondary | ICD-10-CM | POA: Insufficient documentation

## 2017-05-11 DIAGNOSIS — Z7982 Long term (current) use of aspirin: Secondary | ICD-10-CM | POA: Insufficient documentation

## 2017-05-11 DIAGNOSIS — I130 Hypertensive heart and chronic kidney disease with heart failure and stage 1 through stage 4 chronic kidney disease, or unspecified chronic kidney disease: Secondary | ICD-10-CM | POA: Diagnosis not present

## 2017-05-11 DIAGNOSIS — J449 Chronic obstructive pulmonary disease, unspecified: Secondary | ICD-10-CM | POA: Diagnosis not present

## 2017-05-11 DIAGNOSIS — Z794 Long term (current) use of insulin: Secondary | ICD-10-CM | POA: Insufficient documentation

## 2017-05-11 DIAGNOSIS — I2781 Cor pulmonale (chronic): Secondary | ICD-10-CM

## 2017-05-11 DIAGNOSIS — I5032 Chronic diastolic (congestive) heart failure: Secondary | ICD-10-CM | POA: Diagnosis not present

## 2017-05-11 DIAGNOSIS — J9611 Chronic respiratory failure with hypoxia: Secondary | ICD-10-CM | POA: Insufficient documentation

## 2017-05-11 DIAGNOSIS — I38 Endocarditis, valve unspecified: Secondary | ICD-10-CM

## 2017-05-11 DIAGNOSIS — I5022 Chronic systolic (congestive) heart failure: Principal | ICD-10-CM

## 2017-05-11 DIAGNOSIS — E1122 Type 2 diabetes mellitus with diabetic chronic kidney disease: Secondary | ICD-10-CM | POA: Insufficient documentation

## 2017-05-11 DIAGNOSIS — R0683 Snoring: Secondary | ICD-10-CM | POA: Diagnosis not present

## 2017-05-11 DIAGNOSIS — Z9981 Dependence on supplemental oxygen: Secondary | ICD-10-CM | POA: Insufficient documentation

## 2017-05-11 DIAGNOSIS — I270 Primary pulmonary hypertension: Secondary | ICD-10-CM

## 2017-05-11 DIAGNOSIS — I272 Pulmonary hypertension, unspecified: Secondary | ICD-10-CM | POA: Insufficient documentation

## 2017-05-11 MED ORDER — FUROSEMIDE 40 MG PO TABS: 60.0000 mg | ORAL_TABLET | Freq: Two times a day (BID) | ORAL | 2 refills | Status: DC

## 2017-05-11 NOTE — Patient Instructions (Signed)
Increase Furosemide 60 mg, twice a day  Your physician recommends that you return for lab work in: 1 week at [facility] has requested that you have a cardiac catheterization. Cardiac catheterization is used to diagnose and/or treat various heart conditions. Doctors may recommend this procedure for a number of different reasons. The most common reason is to evaluate chest pain. Chest pain can be a symptom of coronary artery disease (CAD), and cardiac catheterization can show whether plaque is narrowing or blocking your heart's arteries. This procedure is also used to evaluate the valves, as well as measure the blood flow and oxygen levels in different parts of your heart. For further information please visit [email or web address][employer][email or web address]. Please follow instruction sheet, as given.  Your physician has recommended that you have a sleep study. This test records several body functions during sleep, including: brain activity, eye movement, oxygen and carbon dioxide blood levels, heart rate and rhythm, breathing rate and rhythm, the flow of air through your mouth and nose, snoring, body muscle movements, and chest and belly movement.  Your physician recommends that you schedule a follow-up appointment in: 3 weeks

## 2017-05-12 NOTE — Progress Notes (Signed)
PCP: Dr. Justin Mend Cardiology: [name] HF Cardiology: Dr. Aundra Dubin  71 yo with history of hypoxemic respiratory failure, chronic diastolic CHF, pulmonary hypertension, prior lung cancer, and CKD stage 3 was referred by [name] for evaluation of pulmonary hypertension and CHF.   Patient wears home oxygen.  PFTs in 3/18 showed moderate-severe restriction but only minimal obstruction.  CTA chest in 1/18 showed relatively normal lung parenchyma and V/Q scan in 8/18 showed no chronic or acute PE.  Echo in 8/18 showed normal LV EF with dilated and dysfunctional RV.  She was admitted in 10/18 with acute/chronic diastolic CHF and was diuresed with IV Lasix.  She is currently on Lasix 40 mg bid.    Currently, she lives at [name].  She is working with PT.  She uses a wheelchair and her walker.  Dyspnea walking short distances.  No orthopnea/PND. No chest pain. She has episodes of lightheadedness with standing.  She has fallen but no syncope.  No palpitations. Symptoms overall are stable.   Labs (10/18): K 4, creatinine 1.46, hgb 14.5  PMH: 1. Chronic hypoxemic respiratory failure: On home oxygen.  2. COPD: This diagnosis has been listed, but PFTs in 3/18 did not show significant obstruction.  3. Chronic diastolic CHF: With cor pulmonale.  - Echo (8/18): EF 65-70%, severe RV dilation with decreased RV systolic function, PASP 67 mmHg.  4. Type II diabetes 5. HTN 6. Hyperlipidemia 7. CKD: Stage 3.  8. CAD: LHC (2015) with 40% pLAD, 50% mLAD.  9. Lung cancer: Treated in 2010 with chemotherapy and resection.  No recurrence.  10. 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   - V/Q scan negative for chronic PE 8/18.  - CTA chest (1/18): No PE, no emphysema, no ILD. - PFTs (3/18): moderate-severe restriction with minimal obstruction, severely decreased DLCO. FVC 63%, FEV1 72%, ratio 114%.  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   SH: Lives at SNF ([name]), son and  daughter in [place], quit smoking in 2008.   Family History  Problem Relation Age of Onset  . Diabetes Father   . Heart failure Father   . Cancer Sister   . Diabetes Brother   . Heart failure Brother    ROS: All systems reviewed and negative except as per HPI.  Current Outpatient Prescriptions  Medication Sig Dispense Refill  . aspirin EC 81 MG tablet Take 81 mg by mouth every evening.    . Carboxymethylcellul-Glycerin (CLEAR EYES FOR DRY EYES OP) Place 1 drop into both eyes 2 (two) times daily.    . Fluticasone-Salmeterol (ADVAIR) 250-50 MCG/DOSE AEPB Inhale 1 puff into the lungs 2 (two) times daily.    . furosemide (LASIX) 40 MG tablet Take 1.5 tablets (60 mg total) by mouth 2 (two) times daily. 30 tablet 2  . gabapentin (NEURONTIN) 300 MG capsule Take 300 mg by mouth 3 (three) times daily.    [name] guaiFENesin (MUCINEX) 600 MG 12 hr tablet Take 1 tablet (600 mg total) by mouth 2 (two) times daily as needed for cough or to loosen phlegm. 20 tablet 0  . insulin aspart (NOVOLOG) 100 UNIT/ML injection Inject 4 Units into the skin 3 (three) times daily with meals. 10 mL 0  . insulin glargine (LANTUS) 100 UNIT/ML injection Inject 0.25 mLs (25 Units total) into the skin at bedtime.    [name] ipratropium-albuterol (DUONEB) 0.5-2.5 (3) MG/3ML SOLN Take 3 mLs by nebulization every 4 (four) hours as needed (wheezing, Shortness of breath). [place]  mL 0  . rosuvastatin (CRESTOR) 40 MG tablet Take 1 tablet (40 mg total) by mouth daily at 6 PM. 90 tablet 3  . sertraline (ZOLOFT) 100 MG tablet Take 150 mg by mouth daily.     [name] spironolactone (ALDACTONE) 25 MG tablet Take 1 tablet (25 mg total) by mouth daily. 30 tablet 0   No current facility-administered medications for this encounter.    Facility-Administered Medications Ordered in Other Encounters  Medication Dose Route Frequency Provider Last Rate Last Dose  . diatrizoate meglumine-sodium (GASTROGRAFIN) 66-10 % solution 30 mL  30 mL Oral PRN MacDiarmid,  Nicki Reaper, MD   30 mL at 11/30/15 0820   BP (!) 104/59   Pulse 74   Wt 170 lb 4 oz (77.2 kg)   SpO2 99% Comment: on 3L of O2  BMI 32.17 kg/m  General: NAD, wearing oxygen Neck: JVP 8 cm with HJR, no thyromegaly or thyroid nodule.  Lungs: Clear to auscultation bilaterally with normal respiratory effort. CV: Nondisplaced PMI.  Heart regular S1/S2, no S3/S4, no murmur.  Trace ankle edema.  No carotid bruit.  Normal pedal pulses.  Abdomen: Soft, nontender, no hepatosplenomegaly, no distention.  Skin: Intact without lesions or rashes.  Neurologic: Alert and oriented x 3.  Psych: Normal affect. Extremities: No clubbing or cyanosis.  HEENT: Normal.   Assessment/Plan: 1. 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  Restrictive PFTs but lung parenchyma looked relatively normal on 1/18 CTA chest.  Findings by PFTs and CT not consistent with emphysema. V/Q scan negative for chronic or acute PEs. Serologic workup was negative. Echo in 8/18 showed dilated and dysfunctional RV.  - She will need a sleep study.  - She will need RHC to establish definitive diagnosis of PAH and assess right and left-sided filling pressures.  We discussed risks/benefits of RHC today and she agreed to proceed with study.  - Continue to wear home oxygen.  2. Chronic hypoxemic respiratory failure: ?Primarily due to pulmonary hypertension.  3. Chronic diastolic CHF: With prominent RV failure. She is volume overloaded on exam.  - Increase Lasix to 60 mg bid. BMET in 1 week.  4. CKD: Stage 3.  Follow BMET as above.  5. CAD: Nonobstructive in 2015.  No exertional chest pain.  Continue statin and ASA 81.   Followup in 3 wks.   [name] 05/12/2017

## 2017-05-13 DIAGNOSIS — C3490 Malignant neoplasm of unspecified part of unspecified bronchus or lung: Secondary | ICD-10-CM | POA: Diagnosis not present

## 2017-05-13 DIAGNOSIS — F329 Major depressive disorder, single episode, unspecified: Secondary | ICD-10-CM | POA: Diagnosis not present

## 2017-05-13 DIAGNOSIS — I509 Heart failure, unspecified: Secondary | ICD-10-CM | POA: Diagnosis not present

## 2017-05-13 DIAGNOSIS — E119 Type 2 diabetes mellitus without complications: Secondary | ICD-10-CM | POA: Diagnosis not present

## 2017-05-13 DIAGNOSIS — I10 Essential (primary) hypertension: Secondary | ICD-10-CM | POA: Diagnosis not present

## 2017-05-14 ENCOUNTER — Other Ambulatory Visit: Payer: Medicare Other | Admitting: *Deleted

## 2017-05-14 DIAGNOSIS — E785 Hyperlipidemia, unspecified: Secondary | ICD-10-CM

## 2017-05-14 LAB — HEPATIC FUNCTION PANEL
ALBUMIN: 3.7 g/dL (ref 3.5–4.8)
ALK PHOS: 165 IU/L — AB (ref 39–117)
ALT: 14 IU/L (ref 0–32)
AST: 24 IU/L (ref 0–40)
Bilirubin Total: 0.4 mg/dL (ref 0.0–1.2)
Bilirubin, Direct: 0.15 mg/dL (ref 0.00–0.40)
TOTAL PROTEIN: 6.8 g/dL (ref 6.0–8.5)

## 2017-05-14 LAB — LIPID PANEL
Chol/HDL Ratio: 4.9 ratio — ABNORMAL HIGH (ref 0.0–4.4)
Cholesterol, Total: 171 mg/dL (ref 100–199)
HDL: 35 mg/dL — ABNORMAL LOW (ref >39)
LDL Calculated: 81 mg/dL (ref 0–99)
Triglycerides: 277 mg/dL — ABNORMAL HIGH (ref 0–149)
VLDL CHOLESTEROL CAL: 55 mg/dL — AB (ref 5–40)

## 2017-05-19 ENCOUNTER — Encounter (HOSPITAL_COMMUNITY): Admission: RE | Payer: Self-pay | Source: Ambulatory Visit

## 2017-05-19 ENCOUNTER — Ambulatory Visit (HOSPITAL_COMMUNITY): Admission: RE | Admit: 2017-05-19 | Payer: Medicare Other | Source: Ambulatory Visit | Admitting: Cardiology

## 2017-05-19 ENCOUNTER — Encounter: Payer: Self-pay | Admitting: *Deleted

## 2017-05-19 DIAGNOSIS — I10 Essential (primary) hypertension: Secondary | ICD-10-CM | POA: Diagnosis not present

## 2017-05-19 DIAGNOSIS — E118 Type 2 diabetes mellitus with unspecified complications: Secondary | ICD-10-CM | POA: Diagnosis not present

## 2017-05-19 DIAGNOSIS — I5033 Acute on chronic diastolic (congestive) heart failure: Secondary | ICD-10-CM | POA: Diagnosis not present

## 2017-05-19 DIAGNOSIS — I2510 Atherosclerotic heart disease of native coronary artery without angina pectoris: Secondary | ICD-10-CM | POA: Diagnosis not present

## 2017-05-19 SURGERY — RIGHT HEART CATH
Anesthesia: LOCAL

## 2017-05-21 ENCOUNTER — Telehealth: Payer: Self-pay | Admitting: Nurse Practitioner

## 2017-05-21 NOTE — Telephone Encounter (Signed)
Spoke with Almyra Free at [facility] and advised her to call the pts Pulmonologist Dr Vaughan Browner, who follows the pts O2, for further orders and tests needed.  Brantley Fling that Dr Vaughan Browner is with [facility].  Almyra Free verbalized understanding and agrees with this plan.

## 2017-05-21 NOTE — Telephone Encounter (Signed)
New message     Bethanne Ginger from [facility] calling to request  new testing and order for oxygen.

## 2017-05-28 DIAGNOSIS — I5033 Acute on chronic diastolic (congestive) heart failure: Secondary | ICD-10-CM | POA: Diagnosis not present

## 2017-05-28 DIAGNOSIS — I10 Essential (primary) hypertension: Secondary | ICD-10-CM | POA: Diagnosis not present

## 2017-05-28 DIAGNOSIS — J9611 Chronic respiratory failure with hypoxia: Secondary | ICD-10-CM | POA: Diagnosis not present

## 2017-05-28 DIAGNOSIS — E118 Type 2 diabetes mellitus with unspecified complications: Secondary | ICD-10-CM | POA: Diagnosis not present

## 2017-05-29 ENCOUNTER — Encounter (HOSPITAL_COMMUNITY): Payer: PRIVATE HEALTH INSURANCE | Admitting: Cardiology

## 2017-06-01 DIAGNOSIS — S92512D Displaced fracture of proximal phalanx of left lesser toe(s), subsequent encounter for fracture with routine healing: Secondary | ICD-10-CM | POA: Diagnosis not present

## 2017-06-01 DIAGNOSIS — I5033 Acute on chronic diastolic (congestive) heart failure: Secondary | ICD-10-CM | POA: Diagnosis not present

## 2017-06-01 DIAGNOSIS — J449 Chronic obstructive pulmonary disease, unspecified: Secondary | ICD-10-CM | POA: Diagnosis not present

## 2017-06-01 DIAGNOSIS — S92312D Displaced fracture of first metatarsal bone, left foot, subsequent encounter for fracture with routine healing: Secondary | ICD-10-CM | POA: Diagnosis not present

## 2017-06-02 ENCOUNTER — Inpatient Hospital Stay (HOSPITAL_COMMUNITY)
Admission: EM | Admit: 2017-06-02 | Discharge: 2017-06-06 | DRG: 871 | Disposition: A | Discharge status: Skilled Nursing Facility | Payer: Medicare Other | Attending: Family Medicine | Admitting: Family Medicine

## 2017-06-02 ENCOUNTER — Other Ambulatory Visit: Payer: Self-pay

## 2017-06-02 ENCOUNTER — Emergency Department (HOSPITAL_COMMUNITY): Payer: Medicare Other

## 2017-06-02 DIAGNOSIS — C3490 Malignant neoplasm of unspecified part of unspecified bronchus or lung: Secondary | ICD-10-CM | POA: Diagnosis present

## 2017-06-02 DIAGNOSIS — J449 Chronic obstructive pulmonary disease, unspecified: Secondary | ICD-10-CM | POA: Diagnosis not present

## 2017-06-02 DIAGNOSIS — S0990XA Unspecified injury of head, initial encounter: Secondary | ICD-10-CM | POA: Diagnosis not present

## 2017-06-02 DIAGNOSIS — R6520 Severe sepsis without septic shock: Secondary | ICD-10-CM | POA: Diagnosis not present

## 2017-06-02 DIAGNOSIS — Z794 Long term (current) use of insulin: Secondary | ICD-10-CM

## 2017-06-02 DIAGNOSIS — Z880 Allergy status to penicillin: Secondary | ICD-10-CM | POA: Diagnosis not present

## 2017-06-02 DIAGNOSIS — Z9981 Dependence on supplemental oxygen: Secondary | ICD-10-CM

## 2017-06-02 DIAGNOSIS — J189 Pneumonia, unspecified organism: Secondary | ICD-10-CM | POA: Diagnosis not present

## 2017-06-02 DIAGNOSIS — W19XXXD Unspecified fall, subsequent encounter: Secondary | ICD-10-CM | POA: Diagnosis not present

## 2017-06-02 DIAGNOSIS — Z7951 Long term (current) use of inhaled steroids: Secondary | ICD-10-CM | POA: Diagnosis not present

## 2017-06-02 DIAGNOSIS — R278 Other lack of coordination: Secondary | ICD-10-CM | POA: Diagnosis not present

## 2017-06-02 DIAGNOSIS — E876 Hypokalemia: Secondary | ICD-10-CM | POA: Diagnosis not present

## 2017-06-02 DIAGNOSIS — R059 Cough, unspecified: Secondary | ICD-10-CM

## 2017-06-02 DIAGNOSIS — Z8249 Family history of ischemic heart disease and other diseases of the circulatory system: Secondary | ICD-10-CM

## 2017-06-02 DIAGNOSIS — Z87891 Personal history of nicotine dependence: Secondary | ICD-10-CM | POA: Diagnosis not present

## 2017-06-02 DIAGNOSIS — N183 Chronic kidney disease, stage 3 (moderate): Secondary | ICD-10-CM | POA: Diagnosis present

## 2017-06-02 DIAGNOSIS — E7800 Pure hypercholesterolemia, unspecified: Secondary | ICD-10-CM | POA: Diagnosis present

## 2017-06-02 DIAGNOSIS — T148XXA Other injury of unspecified body region, initial encounter: Secondary | ICD-10-CM | POA: Diagnosis not present

## 2017-06-02 DIAGNOSIS — E785 Hyperlipidemia, unspecified: Secondary | ICD-10-CM | POA: Diagnosis not present

## 2017-06-02 DIAGNOSIS — C3431 Malignant neoplasm of lower lobe, right bronchus or lung: Secondary | ICD-10-CM | POA: Diagnosis not present

## 2017-06-02 DIAGNOSIS — Z833 Family history of diabetes mellitus: Secondary | ICD-10-CM | POA: Diagnosis not present

## 2017-06-02 DIAGNOSIS — E1122 Type 2 diabetes mellitus with diabetic chronic kidney disease: Secondary | ICD-10-CM | POA: Diagnosis present

## 2017-06-02 DIAGNOSIS — Z7982 Long term (current) use of aspirin: Secondary | ICD-10-CM

## 2017-06-02 DIAGNOSIS — Z79899 Other long term (current) drug therapy: Secondary | ICD-10-CM

## 2017-06-02 DIAGNOSIS — E118 Type 2 diabetes mellitus with unspecified complications: Secondary | ICD-10-CM

## 2017-06-02 DIAGNOSIS — J440 Chronic obstructive pulmonary disease with acute lower respiratory infection: Secondary | ICD-10-CM | POA: Diagnosis present

## 2017-06-02 DIAGNOSIS — S92512D Displaced fracture of proximal phalanx of left lesser toe(s), subsequent encounter for fracture with routine healing: Secondary | ICD-10-CM | POA: Diagnosis not present

## 2017-06-02 DIAGNOSIS — N390 Urinary tract infection, site not specified: Secondary | ICD-10-CM | POA: Diagnosis present

## 2017-06-02 DIAGNOSIS — A419 Sepsis, unspecified organism: Secondary | ICD-10-CM | POA: Diagnosis not present

## 2017-06-02 DIAGNOSIS — M6281 Muscle weakness (generalized): Secondary | ICD-10-CM | POA: Diagnosis not present

## 2017-06-02 DIAGNOSIS — I5033 Acute on chronic diastolic (congestive) heart failure: Secondary | ICD-10-CM | POA: Diagnosis not present

## 2017-06-02 DIAGNOSIS — I5032 Chronic diastolic (congestive) heart failure: Secondary | ICD-10-CM | POA: Diagnosis present

## 2017-06-02 DIAGNOSIS — I248 Other forms of acute ischemic heart disease: Secondary | ICD-10-CM | POA: Diagnosis present

## 2017-06-02 DIAGNOSIS — R410 Disorientation, unspecified: Secondary | ICD-10-CM | POA: Diagnosis not present

## 2017-06-02 DIAGNOSIS — F329 Major depressive disorder, single episode, unspecified: Secondary | ICD-10-CM | POA: Diagnosis not present

## 2017-06-02 DIAGNOSIS — R296 Repeated falls: Secondary | ICD-10-CM | POA: Diagnosis present

## 2017-06-02 DIAGNOSIS — M79672 Pain in left foot: Secondary | ICD-10-CM | POA: Diagnosis not present

## 2017-06-02 DIAGNOSIS — R05 Cough: Secondary | ICD-10-CM

## 2017-06-02 DIAGNOSIS — R2689 Other abnormalities of gait and mobility: Secondary | ICD-10-CM | POA: Diagnosis not present

## 2017-06-02 DIAGNOSIS — W19XXXA Unspecified fall, initial encounter: Secondary | ICD-10-CM | POA: Diagnosis present

## 2017-06-02 DIAGNOSIS — J9611 Chronic respiratory failure with hypoxia: Secondary | ICD-10-CM | POA: Diagnosis not present

## 2017-06-02 DIAGNOSIS — R531 Weakness: Secondary | ICD-10-CM | POA: Diagnosis not present

## 2017-06-02 DIAGNOSIS — M542 Cervicalgia: Secondary | ICD-10-CM | POA: Diagnosis not present

## 2017-06-02 DIAGNOSIS — G9341 Metabolic encephalopathy: Secondary | ICD-10-CM | POA: Diagnosis present

## 2017-06-02 DIAGNOSIS — E873 Alkalosis: Secondary | ICD-10-CM | POA: Diagnosis present

## 2017-06-02 DIAGNOSIS — R9431 Abnormal electrocardiogram [ECG] [EKG]: Secondary | ICD-10-CM | POA: Diagnosis not present

## 2017-06-02 DIAGNOSIS — I130 Hypertensive heart and chronic kidney disease with heart failure and stage 1 through stage 4 chronic kidney disease, or unspecified chronic kidney disease: Secondary | ICD-10-CM | POA: Diagnosis present

## 2017-06-02 DIAGNOSIS — S92312D Displaced fracture of first metatarsal bone, left foot, subsequent encounter for fracture with routine healing: Secondary | ICD-10-CM | POA: Diagnosis not present

## 2017-06-02 DIAGNOSIS — R0602 Shortness of breath: Secondary | ICD-10-CM | POA: Diagnosis not present

## 2017-06-02 DIAGNOSIS — I2510 Atherosclerotic heart disease of native coronary artery without angina pectoris: Secondary | ICD-10-CM | POA: Diagnosis present

## 2017-06-02 DIAGNOSIS — Z85118 Personal history of other malignant neoplasm of bronchus and lung: Secondary | ICD-10-CM

## 2017-06-02 LAB — COMPREHENSIVE METABOLIC PANEL
ALK PHOS: 154 U/L — AB (ref 38–126)
ALT: 14 U/L (ref 14–54)
ANION GAP: 13 (ref 5–15)
AST: 20 U/L (ref 15–41)
Albumin: 3.1 g/dL — ABNORMAL LOW (ref 3.5–5.0)
BILIRUBIN TOTAL: 0.8 mg/dL (ref 0.3–1.2)
BUN: 24 mg/dL — ABNORMAL HIGH (ref 6–20)
CALCIUM: 9.4 mg/dL (ref 8.9–10.3)
CO2: 30 mmol/L (ref 22–32)
CREATININE: 1.45 mg/dL — AB (ref 0.44–1.00)
Chloride: 89 mmol/L — ABNORMAL LOW (ref 101–111)
GFR, EST AFRICAN AMERICAN: 41 mL/min — AB (ref >60)
GFR, EST NON AFRICAN AMERICAN: 36 mL/min — AB (ref >60)
Glucose, Bld: 243 mg/dL — ABNORMAL HIGH (ref 65–99)
Potassium: 3.3 mmol/L — ABNORMAL LOW (ref 3.5–5.1)
SODIUM: 132 mmol/L — AB (ref 135–145)
TOTAL PROTEIN: 7.3 g/dL (ref 6.5–8.1)

## 2017-06-02 LAB — URINALYSIS, COMPLETE (UACMP) WITH MICROSCOPIC
BILIRUBIN URINE: NEGATIVE
Glucose, UA: NEGATIVE mg/dL
KETONES UR: NEGATIVE mg/dL
NITRITE: POSITIVE — AB
PH: 5 (ref 5.0–8.0)
Protein, ur: 100 mg/dL — AB
SPECIFIC GRAVITY, URINE: 1.018 (ref 1.005–1.030)

## 2017-06-02 LAB — I-STAT ARTERIAL BLOOD GAS, ED
Acid-Base Excess: 13 mmol/L — ABNORMAL HIGH (ref 0.0–2.0)
BICARBONATE: 35.2 mmol/L — AB (ref 20.0–28.0)
O2 SAT: 96 %
PCO2 ART: 37.2 mmHg (ref 32.0–48.0)
Patient temperature: 99.8
TCO2: 36 mmol/L — ABNORMAL HIGH (ref 22–32)
pH, Arterial: 7.586 — ABNORMAL HIGH (ref 7.350–7.450)
pO2, Arterial: 74 mmHg — ABNORMAL LOW (ref 83.0–108.0)

## 2017-06-02 LAB — BRAIN NATRIURETIC PEPTIDE: B Natriuretic Peptide: 337.7 pg/mL — ABNORMAL HIGH (ref 0.0–100.0)

## 2017-06-02 LAB — CBC WITH DIFFERENTIAL/PLATELET
BASOS ABS: 0 10*3/uL (ref 0.0–0.1)
BASOS PCT: 0 %
EOS ABS: 0 10*3/uL (ref 0.0–0.7)
Eosinophils Relative: 0 %
HCT: 42.1 % (ref 36.0–46.0)
HEMOGLOBIN: 13.9 g/dL (ref 12.0–15.0)
Lymphocytes Relative: 3 %
Lymphs Abs: 0.5 10*3/uL — ABNORMAL LOW (ref 0.7–4.0)
MCH: 26.5 pg (ref 26.0–34.0)
MCHC: 33 g/dL (ref 30.0–36.0)
MCV: 80.2 fL (ref 78.0–100.0)
MONOS PCT: 6 %
Monocytes Absolute: 1 10*3/uL (ref 0.1–1.0)
NEUTROS ABS: 14.4 10*3/uL — AB (ref 1.7–7.7)
NEUTROS PCT: 91 %
Platelets: 140 10*3/uL — ABNORMAL LOW (ref 150–400)
RBC: 5.25 MIL/uL — ABNORMAL HIGH (ref 3.87–5.11)
RDW: 17.2 % — ABNORMAL HIGH (ref 11.5–15.5)
WBC: 15.9 10*3/uL — AB (ref 4.0–10.5)

## 2017-06-02 LAB — I-STAT CG4 LACTIC ACID, ED: Lactic Acid, Venous: 1.57 mmol/L (ref 0.5–1.9)

## 2017-06-02 LAB — RAPID URINE DRUG SCREEN, HOSP PERFORMED
Amphetamines: NOT DETECTED
BARBITURATES: NOT DETECTED
BENZODIAZEPINES: NOT DETECTED
COCAINE: NOT DETECTED
Opiates: POSITIVE — AB
TETRAHYDROCANNABINOL: NOT DETECTED

## 2017-06-02 LAB — ETHANOL: Alcohol, Ethyl (B): <10 mg/dL (ref <10)

## 2017-06-02 LAB — AMMONIA: AMMONIA: 25 umol/L (ref 9–35)

## 2017-06-02 MED ORDER — ONDANSETRON HCL 4 MG PO TABS
4.0000 mg | ORAL_TABLET | Freq: Four times a day (QID) | ORAL | Status: DC | PRN
  Administered 2017-06-03: 4 mg via ORAL
  Filled 2017-06-02: qty 1

## 2017-06-02 MED ORDER — SODIUM CHLORIDE 0.9 % IV SOLN
INTRAVENOUS | Status: DC
  Administered 2017-06-03: 03:00:00 via INTRAVENOUS

## 2017-06-02 MED ORDER — LEVOFLOXACIN IN D5W 750 MG/150ML IV SOLN
750.0000 mg | Freq: Once | INTRAVENOUS | Status: AC
  Administered 2017-06-02: 750 mg via INTRAVENOUS
  Filled 2017-06-02: qty 150

## 2017-06-02 MED ORDER — ASPIRIN EC 81 MG PO TBEC
81.0000 mg | DELAYED_RELEASE_TABLET | Freq: Every day | ORAL | Status: DC
  Administered 2017-06-03 – 2017-06-06 (×4): 81 mg via ORAL
  Filled 2017-06-02 (×4): qty 1

## 2017-06-02 MED ORDER — POTASSIUM CHLORIDE CRYS ER 20 MEQ PO TBCR
40.0000 meq | EXTENDED_RELEASE_TABLET | ORAL | Status: AC
  Administered 2017-06-03: 40 meq via ORAL
  Filled 2017-06-02: qty 2

## 2017-06-02 MED ORDER — ACETAMINOPHEN 650 MG RE SUPP: 650.0000 mg | Freq: Four times a day (QID) | RECTAL | Status: DC | PRN

## 2017-06-02 MED ORDER — INSULIN ASPART 100 UNIT/ML ~~LOC~~ SOLN
4.0000 [IU] | Freq: Three times a day (TID) | SUBCUTANEOUS | Status: DC
  Administered 2017-06-03 – 2017-06-06 (×11): 4 [IU] via SUBCUTANEOUS

## 2017-06-02 MED ORDER — ENOXAPARIN SODIUM 40 MG/0.4ML ~~LOC~~ SOLN
40.0000 mg | SUBCUTANEOUS | Status: DC
  Administered 2017-06-03: 40 mg via SUBCUTANEOUS
  Filled 2017-06-02: qty 0.4

## 2017-06-02 MED ORDER — LEVOFLOXACIN IN D5W 750 MG/150ML IV SOLN
750.0000 mg | INTRAVENOUS | Status: DC
  Administered 2017-06-04: 750 mg via INTRAVENOUS
  Filled 2017-06-02 (×2): qty 150

## 2017-06-02 MED ORDER — MOMETASONE FURO-FORMOTEROL FUM 200-5 MCG/ACT IN AERO
2.0000 | INHALATION_SPRAY | Freq: Two times a day (BID) | RESPIRATORY_TRACT | Status: DC
  Administered 2017-06-03 – 2017-06-06 (×6): 2 via RESPIRATORY_TRACT
  Filled 2017-06-02 (×2): qty 8.8

## 2017-06-02 MED ORDER — ONDANSETRON HCL 4 MG/2ML IJ SOLN: 4.0000 mg | Freq: Four times a day (QID) | INTRAMUSCULAR | Status: DC | PRN

## 2017-06-02 MED ORDER — SODIUM CHLORIDE 0.9 % IV SOLN
Freq: Once | INTRAVENOUS | Status: AC
  Administered 2017-06-02: 23:00:00 via INTRAVENOUS

## 2017-06-02 MED ORDER — GUAIFENESIN ER 600 MG PO TB12: 600.0000 mg | ORAL_TABLET | Freq: Two times a day (BID) | ORAL | Status: DC | PRN

## 2017-06-02 MED ORDER — GABAPENTIN 300 MG PO CAPS
300.0000 mg | ORAL_CAPSULE | Freq: Three times a day (TID) | ORAL | Status: DC
  Administered 2017-06-03 – 2017-06-06 (×11): 300 mg via ORAL
  Filled 2017-06-02 (×12): qty 1

## 2017-06-02 MED ORDER — INSULIN DETEMIR 100 UNIT/ML ~~LOC~~ SOLN
30.0000 [IU] | Freq: Every day | SUBCUTANEOUS | Status: DC
  Administered 2017-06-03 – 2017-06-05 (×4): 30 [IU] via SUBCUTANEOUS
  Filled 2017-06-02 (×5): qty 0.3

## 2017-06-02 MED ORDER — IPRATROPIUM-ALBUTEROL 0.5-2.5 (3) MG/3ML IN SOLN: 3.0000 mL | RESPIRATORY_TRACT | Status: DC | PRN

## 2017-06-02 MED ORDER — ACETAMINOPHEN 325 MG PO TABS
650.0000 mg | ORAL_TABLET | Freq: Four times a day (QID) | ORAL | Status: DC | PRN
  Administered 2017-06-04 – 2017-06-06 (×4): 650 mg via ORAL
  Filled 2017-06-02 (×4): qty 2

## 2017-06-02 NOTE — H&P (Signed)
History and Physical    Amy Shepard IHW:[id number] DOB: [birth year] DOA: 06/02/2017  Referring MD/NP/PA: Dr. Addison Lank PCP: Maurice Small, MD  Patient coming from: [facility] via [facility]  Chief Complaint: [place]  I have personally briefly reviewed patient's old medical records in [place]  HPI: Amy Shepard is a 71 y.o. female with medical history significant of diastolic CHF last EF 49-17% with grade 1dFx in 02/2017, COPD, oxygen dependent 3L, lung cancer in remission, and DM type II; who presents to urgent facility after being found to be acutely altered.  Patient had reportedly fallen 2 days ago, and then fell again this morning at around 8 AM.  She was seen and her PCPs office and upon returning back to the facility was noted to be acutely altered and confused.  Patient reports feeling off balance denies any trauma to her head or loss of consciousness.  Reported associated symptoms of chills, nausea, vomiting, and epigastric abdominal pain.  She denies having any urinary symptoms.   ED Course: Upon admission into the emergency department patient was noted to be febrile up to 101.2 F, pulse 79-91, respirations 14-26, blood pressures noted to be as low as 93/56, and O2 saturations 91-97% on 3-4 L.  Labs revealed WBC 15.9, hemoglobin 13.9, platelets 149, sodium 132, potassium 3.3, chloride 89, BUN 24, creatinine 1.45, glucose 243, ammonia 25, and lactic acid 1.45.  UDS was positive for opiates and UA showed significant signs of infection.  Patient was started on Levaquin and given 1 L bolus of IV fluids.  [facility] called to admit.   Review of Systems  Constitutional: Positive for chills and malaise/fatigue.  HENT: Negative for ear discharge and nosebleeds.   Eyes: Negative for pain and discharge.  Respiratory: Positive for shortness of breath (Chronic). Negative for cough.   Cardiovascular: Negative for chest pain and palpitations.  Gastrointestinal: Positive for abdominal  pain, nausea and vomiting.  Genitourinary: Negative for dysuria and flank pain.  Musculoskeletal: Positive for falls and joint pain.  Skin: Negative for itching.  Neurological: Negative for seizures and loss of consciousness.  Psychiatric/Behavioral: Positive for memory loss. Negative for substance abuse.    Past Medical History:  Diagnosis Date  . Arthritis   . CAD (coronary artery disease)    a. Prior cath 2015 showed 40% prox AD, 50-50% mLAD, otherwise calcification but no obstruction in LCx/RCA.. Medical management recommended. b. 2017: low-risk NST.  [name] Chronic diastolic CHF (congestive heart failure) ([place])   . Chronic respiratory failure ([place])   . CKD (chronic kidney disease), stage III ([place])   . COPD (chronic obstructive pulmonary disease) ([place])   . Cor pulmonale ([name])    a. felt due to advanced COPD and noncompliance with O2.  . Depression   . Diabetes mellitus    [name]    dx  2008  . Hx of seasonal allergies   . Hypercholesteremia   . Hypertension    on no medications  . Lung cancer ([place])   . On home oxygen therapy    "2.5L; 24/7" (03/10/2017)  . Pericardial effusion    a. small-moderate in 07/2016.    Past Surgical History:  Procedure Laterality Date  . [facility] in [place]     bilateral cataracts  . LEFT HEART CATHETERIZATION WITH CORONARY ANGIOGRAM N/A 07/12/2014   Procedure: LEFT HEART CATHETERIZATION WITH CORONARY ANGIOGRAM;  Surgeon: [name], MD;  Location: [facility] CATH [facility];  Service: Cardiovascular;  Laterality: N/A;  . MAXIMUM ACCESS (MAS)POSTERIOR LUMBAR INTERBODY FUSION (PLIF) 2 LEVEL N/A 02/22/2016   Procedure: Lumbar three-four - Lumbar four-five  MAXIMUM ACCESS SURGERY  POSTERIOR LUMBAR INTERBODY FUSION;  Surgeon: [name], MD;  Location: [facility];  Service: Neurosurgery;  Laterality: N/A;  . THORACOTOMY Right 2010   lower  . TONSILLECTOMY       reports that she quit smoking  about 10 years ago. She has a 30.00 pack-year smoking history. she has never used smokeless tobacco. She reports that she does not drink alcohol or use drugs.  Allergies  Allergen Reactions  . Amoxicillin Anaphylaxis, Hives and Rash    Has patient had a PCN reaction causing immediate rash, facial/tongue/throat swelling, SOB or lightheadedness with hypotension: YES Positive reaction causing SEVERE RASH INVOLVING MUCUS MEMBRANES/SKIN NECROSIS: YES Reaction that required HOSPITALIZATION: YES Reaction occurring within the last 10 years: NO    Family History  Problem Relation Age of Onset  . Diabetes Father   . Heart failure Father   . Cancer Sister   . Diabetes Brother   . Heart failure Brother     Prior to Admission medications   Medication Sig Start Date End Date Taking? Authorizing Provider  acetaminophen (TYLENOL) 325 MG tablet Take 650 mg by mouth every 6 (six) hours as needed (for pain or fever).   Yes [provider]  aspirin EC 81 MG tablet Take 81 mg by mouth daily.    Yes [provider]  Fluticasone-Salmeterol (ADVAIR) 250-50 MCG/DOSE AEPB Inhale 1 puff into the lungs 2 (two) times daily.   Yes [provider]  furosemide (LASIX) 40 MG tablet Take 1.5 tablets (60 mg total) by mouth 2 (two) times daily. 05/11/17  Yes Larey Dresser, MD  gabapentin (NEURONTIN) 300 MG capsule Take 300 mg by mouth 3 (three) times daily.   Yes [provider]  guaiFENesin (MUCINEX) 600 MG 12 hr tablet Take 1 tablet (600 mg total) by mouth 2 (two) times daily as needed for cough or to loosen phlegm. 05/07/17  Yes Aline August, MD  insulin aspart (NOVOLOG) 100 UNIT/ML injection Inject 4 Units into the skin 3 (three) times daily with meals. Patient taking differently: Inject 4 Units into the skin 3 (three) times daily with meals. Per sliding scale: BGL 101-150 = 2 units; 151-200 = 3 units; 201-250 = 5 units; 251-300 = 7 units; 301-350 = 9 units; >350 = 11 units;  CALL MD IF BGL IS >400 OR <60 05/07/17  Yes Alekh, Kshitiz, MD  Insulin Detemir (LEVEMIR FLEXTOUCH) 100 UNIT/ML Pen Inject 30 Units into the skin at bedtime.   Yes [provider]  ipratropium-albuterol (DUONEB) 0.5-2.5 (3) MG/3ML SOLN Take 3 mLs by nebulization every 4 (four) hours as needed (wheezing, Shortness of breath). Patient taking differently: Take 3 mLs by nebulization every 4 (four) hours as needed (for wheezing or shortness of breath).  03/13/16  Yes Johnson, Clanford L, MD  Ketotifen Fumarate (REFRESH EYE ITCH RELIEF OP) Place 1 drop into both eyes 2 (two) times daily.   Yes [provider]  OXYGEN Inhale 3 L into the lungs continuous. FOR COPD   Yes [provider]  rosuvastatin (CRESTOR) 40 MG tablet Take 1 tablet (40 mg total) by mouth daily at 6 PM. 04/02/17  Yes Dunn, Dayna N, PA-C  sertraline (ZOLOFT) 100 MG tablet Take 150 mg by mouth daily.    Yes [provider]  spironolactone (ALDACTONE) 25 MG tablet Take 1 tablet (25 mg total) by mouth daily. 03/15/17 06/13/17 Yes Rosita Fire, MD  traMADol (ULTRAM) 50 MG tablet Take 50 mg by mouth every 8 (eight) hours as needed (for pain).   Yes [provider]  insulin glargine (LANTUS) 100 UNIT/ML injection Inject 0.25 mLs (25 Units total) into the skin at bedtime. Patient not taking: Reported on 06/02/2017 05/07/17   Aline August, MD    Physical Exam:  Constitutional: Elderly female who appears ill, but able to follow commands at this time. Vitals:   06/02/17 2057 06/02/17 2100 06/02/17 2115 06/02/17 2145  BP:  111/67 112/62 128/67  Pulse:  86  88  Resp:  14  (!) 25  Temp: (!) 101.2 F (38.4 C)     TempSrc: Rectal     SpO2:  96%  92%  Weight:      Height:       Eyes: PERRL, lids and conjunctivae normal ENMT: Mucous membranes are dry. Posterior pharynx clear of any exudate or lesions.   Neck: normal, supple, no masses, no thyromegaly Respiratory: Decreased overall aeration  noted.  Patient on nasal cannula oxygen 4 L saturation 93%.  Normal respiratory rate without signs of accessory muscle use. cardiovascular: Regular rate and rhythm, no murmurs / rubs / gallops..  Trace lower extremity edema. 2+ pedal pulses. No carotid bruits.  Abdomen: Mild periumbilical tenderness noted.  Bowel sounds positive Musculoskeletal: no clubbing / cyanosis.  Left lower extremity splinted Skin: no rashes, lesions, ulcers. No induration Neurologic: CN 2-12 grossly intact. Sensation intact, DTR normal. Strength 5/5 in all 4.  Psychiatric: Normal judgment and insight. Alert and oriented x 3. Normal mood.     Labs on Admission: I have personally reviewed following labs and imaging studies  CBC: Recent Labs  Lab 06/02/17 1903  WBC 15.9*  NEUTROABS 14.4*  HGB 13.9  HCT 42.1  MCV 80.2  PLT 433*   Basic Metabolic Panel: Recent Labs  Lab 06/02/17 1903  NA 132*  K 3.3*  CL 89*  CO2 30  GLUCOSE 243*  BUN 24*  CREATININE 1.45*  CALCIUM 9.4   GFR: Estimated Creatinine Clearance: 33.1 mL/min (A) (by C-G formula based on SCr of 1.45 mg/dL (H)). Liver Function Tests: Recent Labs  Lab 06/02/17 1903  AST 20  ALT 14  ALKPHOS 154*  BILITOT 0.8  PROT 7.3  ALBUMIN 3.1*   No results for input(s): LIPASE, AMYLASE in the last 168 hours. Recent Labs  Lab 06/02/17 1935  AMMONIA 25   Coagulation Profile: No results for input(s): INR, PROTIME in the last 168 hours. Cardiac Enzymes: No results for input(s): CKTOTAL, CKMB, CKMBINDEX, TROPONINI in the last 168 hours. BNP (last 3 results) Recent Labs    07/21/16 1210 07/29/16 1159 09/05/16 1131  PROBNP 2,696* 3,611* 2,929*   HbA1C: No results for input(s): HGBA1C in the last 72 hours. CBG: No results for input(s): GLUCAP in the last 168 hours. Lipid Profile: No results for input(s): CHOL, HDL, LDLCALC, TRIG, CHOLHDL, LDLDIRECT in the last 72 hours. Thyroid Function Tests: No results for input(s): TSH, T4TOTAL,  FREET4, T3FREE, THYROIDAB in the last 72 hours. Anemia Panel: No results for input(s): VITAMINB12, FOLATE, FERRITIN, TIBC, IRON, RETICCTPCT in the last 72 hours. Urine analysis:    Component Value Date/Time   COLORURINE YELLOW 06/02/2017 2055   APPEARANCEUR CLOUDY (A) 06/02/2017 2055   LABSPEC 1.018 06/02/2017 2055   PHURINE 5.0 06/02/2017 2055  GLUCOSEU NEGATIVE 06/02/2017 2055   HGBUR SMALL (A) 06/02/2017 2055   BILIRUBINUR NEGATIVE 06/02/2017 2055   [place] 06/02/2017 2055   PROTEINUR 100 (A) 06/02/2017 2055   UROBILINOGEN 0.2 02/10/2013 1041   NITRITE POSITIVE (A) 06/02/2017 2055   LEUKOCYTESUR MODERATE (A) 06/02/2017 2055   Sepsis Labs: No results found for this or any previous visit (from the past 240 hour(s)).   Radiological Exams on Admission: Dg Chest 2 View  Result Date: 06/02/2017 CLINICAL DATA:  71 year old female with cough x3 days. EXAM: CHEST  2 VIEW COMPARISON:  Chest radiograph dated 05/03/2017 FINDINGS: There is cardiomegaly with mild vascular congestion. Hazy density at the right lung base likely atelectatic changes. Trace right pleural effusion is not excluded. There is no focal consolidation or pneumothorax. There is atherosclerotic calcification of the aortic arch. Osteopenia with degenerative changes of the spine. No acute osseous pathology. IMPRESSION: Cardiomegaly with mild vascular congestion and possible trace right pleural effusion. Electronically Signed   By: Anner Crete M.D.   On: 06/02/2017 20:31   Ct Head Wo Contrast  Result Date: 06/02/2017 CLINICAL DATA:  Unwitnessed fall with  confusion EXAM: CT HEAD WITHOUT CONTRAST TECHNIQUE: Contiguous axial images were obtained from the base of the skull through the vertex without intravenous contrast. COMPARISON:  07/11/2008 FINDINGS: Brain: Generalized atrophic changes are noted. Mild chronic white matter ischemic change is seen. No findings to suggest acute hemorrhage, acute infarction or  space-occupying mass lesion are noted. Vascular: No hyperdense vessel or unexpected calcification. Skull: Normal. Negative for fracture or focal lesion. Sinuses/Orbits: Mucosal retention cyst in the sphenoid sinus. Other: None. IMPRESSION: Chronic changes without acute abnormality. Electronically Signed   By: Inez Catalina M.D.   On: 06/02/2017 20:13    EKG: Independently reviewed.  Sinus rhythm with prolonged QTC of 574  Assessment/Plan Sepsis secondary to urinary tract infection: Acute.  Patient presents altered and was found to have fever up to 101.2 F, tachypnea, WBC 15.9, and abnormal urinalysis.  Lactic acid was reassuring at 1.57.  Patient was started on empiric antibiotics of Levaquin given previous history of penicillin allergy. - Admit to telemetry bed - Follow-up urine and blood cultures - Continue Levaquin - Trend lactic acid levels - Tylenol prn fever  Acute encephalopathy: Suspect likely secondary to acute infection. - Neurochecks q 4 hrs  Falls - Physical therapy to eval and treat  Prolonged QTC: Acute. Initially noted to have a QTC of 574. - Recheck EKG this a.m. - Correct electrolyte abnormalities  Essential hypertension: Patient with soft blood pressures on admission noted to be as low as 93/56.  Patient found to have elevated BUN which could be suggestive of dehydration. - Held furosemide - Gentle fluids of NS at 75 ml/h  Chronic respiratory failure with hypoxia, COPD(without exacerbation): Patient appears to be comfortable in no acute respiratory distress at this time on 4 L of nasal oxygen. - Continuous pulse oximetry overnight with oxygen as needed - Continue pharmacy substitution of Dulera - Continue duonebs prn SOB/Wheezing  Diabetes mellitus type 2, uncontrolled: Last hemoglobin A1c noted to be 14.4 on 05/02/2017. - Hypoglycemic protocol - Continue home Lantus 30 units nightly - CBGs qAC with home sliding scale insulin regimen  Diastolic CHF: Chronic.   Patient does not appear to be acutely fluid overloaded at this time.  Chest x-ray otherwise clear.  Patient - Strict intake and output and daily weights - Follow-up BMP  Hypokalemia: Acute initial potassium 3.3 on admission - 40 mEq of  potassium chloride x1 dose now - Continue to monitor and replace as needed   History of lung cancer  Chronic kidney disease stage III: Initial BUN 24 and creatinine 1.45.  Patient baseline creatinine appears to range from 1.2-1.5. - Continue to monitor  DVT prophylaxis: lovenox Code Status: Full Family Communication: No family present at bedside Disposition Plan: Likely discharge to skilled nursing facility in 2-3 days Consults called: none  Admission status: inpatient  Norval Morton MD [facility] Pager [phone]   If 7PM-7AM, please contact night-coverage [email or web address] Password [email or web address]  06/02/2017, 10:40 PM

## 2017-06-02 NOTE — ED Notes (Signed)
Patient transported to CT

## 2017-06-02 NOTE — ED Provider Notes (Addendum)
[facility] EMERGENCY DEPARTMENT Provider Note  CSN: [id number] Arrival date & time: 06/02/17 1845  Chief Complaint(s) Altered Mental Status and Fall  HPI Amy Shepard is a 71 y.o. female with an extensive past medical history listed below who presents to the emergency department from skilled nursing facility for altered mental status.  History obtained from EMS and family.  EMS reported that the facility noted that patient was altered after she returned from her appointment with her PCP.  They report that the family took the patient to her appointment.  When she returned the facility noted her change in mental status.  They they report that the patient had several falls in the last 2 days, last of which was earlier this morning.  Patient is not on any anticoagulation.  We spoke with family who reported that, to them, the patient was at her baseline mental status.  They report that the patient has been going through some memory problems for several months and gradually worsening.  Patient is on home oxygen and her home oxygen was provided while she left the facility.  Remainder of history, ROS, and physical exam limited due to patient's condition (AMS). Additional information was obtained from EMS and Family.   Level V Caveat.    HPI  Past Medical History Past Medical History:  Diagnosis Date  . Arthritis   . CAD (coronary artery disease)    a. Prior cath 2015 showed 40% prox AD, 50-50% mLAD, otherwise calcification but no obstruction in LCx/RCA.. Medical management recommended. b. 2017: low-risk NST.  [name] Chronic diastolic CHF (congestive heart failure) ([place])   . Chronic respiratory failure ([place])   . CKD (chronic kidney disease), stage III ([place])   . COPD (chronic obstructive pulmonary disease) ([place])   . Cor pulmonale ([name])    a. felt due to advanced COPD and noncompliance with O2.  . Depression   . Diabetes mellitus    [name]    dx  2008  . Hx of seasonal  allergies   . Hypercholesteremia   . Hypertension    on no medications  . Lung cancer ([place])   . On home oxygen therapy    "2.5L; 24/7" (03/10/2017)  . Pericardial effusion    a. small-moderate in 07/2016.   Patient Active Problem List   Diagnosis Date Noted  . Sepsis secondary to UTI ([place]) 06/02/2017  . Dog bite 05/02/2017  . CHF exacerbation ([place]) 05/02/2017  . Acute diastolic CHF (congestive heart failure) ([place]) 05/02/2017  . Chronic diastolic CHF (congestive heart failure) ([place]) 04/02/2017  . Cor pulmonale ([place]) 04/02/2017  . Acute on chronic respiratory failure ([place]) 03/10/2017  . CKD (chronic kidney disease), stage III ([place]) 03/10/2017  . Chronic pain 03/10/2017  . Lactic acidosis 02/10/2017  . Acute respiratory failure with hypoxia ([place]) 02/09/2017  . Acute on chronic diastolic CHF (congestive heart failure) ([place]) 06/23/2016  . HTN (hypertension) 06/23/2016  . Type 2 diabetes mellitus with complication, with long-term current use of insulin ([place]) 03/11/2016  . Depression 03/11/2016  . Chronic obstructive pulmonary disease ([place]) 03/10/2016  . S/P lumbar spinal fusion 02/22/2016  . Coronary artery disease involving native coronary artery 07/12/2014  . Pain in the chest   . Malignant neoplasm of lower lobe of right lung ([place]) 05/25/2014  . Lung cancer ([place]) 09/01/2012   Home Medication(s) Prior to Admission medications   Medication Sig Start Date End Date Taking? Authorizing Provider  acetaminophen (TYLENOL) 325 MG tablet Take 650 mg by  mouth every 6 (six) hours as needed (for pain or fever).   Yes [provider]  aspirin EC 81 MG tablet Take 81 mg by mouth daily.    Yes [provider]  Fluticasone-Salmeterol (ADVAIR) 250-50 MCG/DOSE AEPB Inhale 1 puff into the lungs 2 (two) times daily.   Yes [provider]  furosemide (LASIX) 40 MG tablet Take 1.5 tablets (60 mg total) by mouth 2 (two) times daily. 05/11/17  Yes Larey Dresser, MD    gabapentin (NEURONTIN) 300 MG capsule Take 300 mg by mouth 3 (three) times daily.   Yes [provider]  guaiFENesin (MUCINEX) 600 MG 12 hr tablet Take 1 tablet (600 mg total) by mouth 2 (two) times daily as needed for cough or to loosen phlegm. 05/07/17  Yes Aline August, MD  insulin aspart (NOVOLOG) 100 UNIT/ML injection Inject 4 Units into the skin 3 (three) times daily with meals. Patient taking differently: Inject 4 Units into the skin 3 (three) times daily with meals. Per sliding scale: BGL 101-150 = 2 units; 151-200 = 3 units; 201-250 = 5 units; 251-300 = 7 units; 301-350 = 9 units; >350 = 11 units; CALL MD IF BGL IS >400 OR <60 05/07/17  Yes Alekh, Kshitiz, MD  Insulin Detemir (LEVEMIR FLEXTOUCH) 100 UNIT/ML Pen Inject 30 Units into the skin at bedtime.   Yes [provider]  ipratropium-albuterol (DUONEB) 0.5-2.5 (3) MG/3ML SOLN Take 3 mLs by nebulization every 4 (four) hours as needed (wheezing, Shortness of breath). Patient taking differently: Take 3 mLs by nebulization every 4 (four) hours as needed (for wheezing or shortness of breath).  03/13/16  Yes Johnson, Clanford L, MD  Ketotifen Fumarate (REFRESH EYE ITCH RELIEF OP) Place 1 drop into both eyes 2 (two) times daily.   Yes [provider]  OXYGEN Inhale 3 L into the lungs continuous. FOR COPD   Yes [provider]  rosuvastatin (CRESTOR) 40 MG tablet Take 1 tablet (40 mg total) by mouth daily at 6 PM. 04/02/17  Yes Dunn, Dayna N, PA-C  sertraline (ZOLOFT) 100 MG tablet Take 150 mg by mouth daily.    Yes [provider]  spironolactone (ALDACTONE) 25 MG tablet Take 1 tablet (25 mg total) by mouth daily. 03/15/17 06/13/17 Yes Rosita Fire, MD  traMADol (ULTRAM) 50 MG tablet Take 50 mg by mouth every 8 (eight) hours as needed (for pain).   Yes [provider]  insulin glargine (LANTUS) 100 UNIT/ML injection Inject 0.25 mLs (25 Units total) into the skin at bedtime. Patient not  taking: Reported on 06/02/2017 05/07/17   Aline August, MD                                                                                                                                    Past Surgical History Past Surgical History:  Procedure Laterality Date  . CARDIAC CATHETERIZATION  1995 or [facility] in [place]     bilateral cataracts  . LEFT HEART CATHETERIZATION WITH CORONARY ANGIOGRAM N/A 07/12/2014   Procedure: LEFT HEART CATHETERIZATION WITH CORONARY ANGIOGRAM;  Surgeon: [name], MD;  Location: [facility] CATH LAB;  Service: Cardiovascular;  Laterality: N/A;  . MAXIMUM ACCESS (MAS)POSTERIOR LUMBAR INTERBODY FUSION (PLIF) 2 LEVEL N/A 02/22/2016   Procedure: Lumbar three-four - Lumbar four-five  MAXIMUM ACCESS SURGERY  POSTERIOR LUMBAR INTERBODY FUSION;  Surgeon: [name], MD;  Location: [facility] NEURO ORS;  Service: Neurosurgery;  Laterality: N/A;  . THORACOTOMY Right 2010   lower  . TONSILLECTOMY     Family History Family History  Problem Relation Age of Onset  . Diabetes Father   . Heart failure Father   . Cancer Sister   . Diabetes Brother   . Heart failure Brother     Social History Social History   Tobacco Use  . Smoking status: Former Smoker    Packs/day: 1.00    Years: 30.00    Pack years: 30.00    Last attempt to quit: 07/14/2006    Years since quitting: 10.8  . Smokeless tobacco: Never Used  Substance Use Topics  . Alcohol use: No  . Drug use: No   Allergies Amoxicillin  Review of Systems Review of Systems  Unable to perform ROS: Mental status change    Physical Exam Vital Signs  I have reviewed the triage vital signs BP (!) 99/58   Pulse 87   Temp (!) 101.2 F (38.4 C) (Rectal)   Resp (!) 25   Ht _0  (1.549 m)   Wt 73.5 kg (162 lb)   SpO2 91%   BMI 30.61 kg/m   Physical Exam  Constitutional: She is oriented to person, place, and time. She appears well-developed and well-nourished. No distress.  HENT:    Head: Normocephalic and atraumatic.  Nose: Nose normal.  Eyes: Conjunctivae and EOM are normal. Pupils are equal, round, and reactive to light. Right eye exhibits no discharge. Left eye exhibits no discharge. No scleral icterus.  Neck: Normal range of motion. Neck supple.  Cardiovascular: Normal rate and regular rhythm. Exam reveals no gallop and no friction rub.  No murmur heard. Pulmonary/Chest: Effort normal and breath sounds normal. No stridor. No respiratory distress. She has no rales.  Abdominal: Soft. She exhibits no distension. There is no tenderness.  Musculoskeletal: She exhibits no edema or tenderness.       Legs: Neurological: She is alert and oriented to person, place, and time. She has normal strength. No cranial nerve deficit or sensory deficit.  Patient is forgetful regarding details surrounding events throughout the day.  Initially stated that she did not go to her physician's appointment today and that the facility had all wrong.  A few minutes later when asked if she had seen her primary care doctor today the patient stated yes that she had gone with her family.  Patient also was noted to have difficulty with word finding.  Skin: Skin is warm and dry. No rash noted. She is not diaphoretic. No erythema.  Psychiatric: She has a normal mood and affect.  Vitals reviewed.   ED Results and Treatments Labs (all labs ordered are listed, but only abnormal results are displayed) Labs Reviewed  COMPREHENSIVE METABOLIC PANEL - Abnormal; Notable for the following components:      Result Value   Sodium 132 (*)    Potassium 3.3 (*)  Chloride 89 (*)    Glucose, Bld 243 (*)    BUN 24 (*)    Creatinine, Ser 1.45 (*)    Albumin 3.1 (*)    Alkaline Phosphatase 154 (*)    GFR calc non Af Amer 36 (*)    GFR calc Af Amer 41 (*)    All other components within normal limits  CBC WITH DIFFERENTIAL/PLATELET - Abnormal; Notable for the following components:   WBC 15.9 (*)    RBC 5.25  (*)    RDW 17.2 (*)    Platelets 140 (*)    Neutro Abs 14.4 (*)    Lymphs Abs 0.5 (*)    All other components within normal limits  URINALYSIS, COMPLETE (UACMP) WITH MICROSCOPIC - Abnormal; Notable for the following components:   APPearance CLOUDY (*)    Hgb urine dipstick SMALL (*)    Protein, ur 100 (*)    Nitrite POSITIVE (*)    Leukocytes, UA MODERATE (*)    Bacteria, UA MANY (*)    Squamous Epithelial / LPF 0-5 (*)    All other components within normal limits  RAPID URINE DRUG SCREEN, HOSP PERFORMED - Abnormal; Notable for the following components:   Opiates POSITIVE (*)    All other components within normal limits  BRAIN NATRIURETIC PEPTIDE - Abnormal; Notable for the following components:   B Natriuretic Peptide 337.7 (*)    All other components within normal limits  I-STAT ARTERIAL BLOOD GAS, ED - Abnormal; Notable for the following components:   pH, Arterial 7.586 (*)    pO2, Arterial 74.0 (*)    Bicarbonate 35.2 (*)    TCO2 36 (*)    Acid-Base Excess 13.0 (*)    All other components within normal limits  CULTURE, BLOOD (ROUTINE X 2)  CULTURE, BLOOD (ROUTINE X 2)  AMMONIA  ETHANOL  MAGNESIUM  CBC  BASIC METABOLIC PANEL  I-STAT CG4 LACTIC ACID, ED                                                                                                                         EKG  EKG Interpretation  Date/Time:    Ventricular Rate:    PR Interval:    QRS Duration:   QT Interval:    QTC Calculation:   R Axis:     Text Interpretation:        Radiology Dg Chest 2 View  Result Date: 06/02/2017 CLINICAL DATA:  71 year old female with cough x3 days. EXAM: CHEST  2 VIEW COMPARISON:  Chest radiograph dated 05/03/2017 FINDINGS: There is cardiomegaly with mild vascular congestion. Hazy density at the right lung base likely atelectatic changes. Trace right pleural effusion is not excluded. There is no focal consolidation or pneumothorax. There is atherosclerotic calcification  of the aortic arch. Osteopenia with degenerative changes of the spine. No acute osseous pathology. IMPRESSION: Cardiomegaly with mild vascular congestion and possible trace right pleural effusion. Electronically Signed   By: Anner Crete  M.D.   On: 06/02/2017 20:31   Ct Head Wo Contrast  Result Date: 06/02/2017 CLINICAL DATA:  Unwitnessed fall with  confusion EXAM: CT HEAD WITHOUT CONTRAST TECHNIQUE: Contiguous axial images were obtained from the base of the skull through the vertex without intravenous contrast. COMPARISON:  07/11/2008 FINDINGS: Brain: Generalized atrophic changes are noted. Mild chronic white matter ischemic change is seen. No findings to suggest acute hemorrhage, acute infarction or space-occupying mass lesion are noted. Vascular: No hyperdense vessel or unexpected calcification. Skull: Normal. Negative for fracture or focal lesion. Sinuses/Orbits: Mucosal retention cyst in the sphenoid sinus. Other: None. IMPRESSION: Chronic changes without acute abnormality. Electronically Signed   By: Inez Catalina M.D.   On: 06/02/2017 20:13   Pertinent labs & imaging results that were available during my care of the patient were reviewed by me and considered in my medical decision making (see chart for details).  Medications Ordered in ED Medications  levofloxacin (LEVAQUIN) IVPB 750 mg (750 mg Intravenous New Bag/Given 06/02/17 2234)  levofloxacin (LEVAQUIN) IVPB 750 mg (not administered)  Insulin Detemir (LEVEMIR) FlexPen 30 Units (not administered)  ipratropium-albuterol (DUONEB) 0.5-2.5 (3) MG/3ML nebulizer solution 3 mL (not administered)  insulin aspart (novoLOG) injection 4 Units (not administered)  guaiFENesin (MUCINEX) 12 hr tablet 600 mg (not administered)  aspirin EC tablet 81 mg (not administered)  mometasone-formoterol (DULERA) 200-5 MCG/ACT inhaler 2 puff (not administered)  gabapentin (NEURONTIN) capsule 300 mg (not administered)  enoxaparin (LOVENOX) injection 40 mg (not  administered)  potassium chloride SA (K-DUR,KLOR-CON) CR tablet 40 mEq (not administered)  0.9 %  sodium chloride infusion (not administered)  ondansetron (ZOFRAN) tablet 4 mg (not administered)    Or  ondansetron (ZOFRAN) injection 4 mg (not administered)  acetaminophen (TYLENOL) tablet 650 mg (not administered)    Or  acetaminophen (TYLENOL) suppository 650 mg (not administered)  0.9 %  sodium chloride infusion ( Intravenous New Bag/Given 06/02/17 2311)                                                                                                                                    Procedures Procedures  (including critical care time)  Medical Decision Making / ED Course I have reviewed the nursing notes for this encounter and the patient's prior records (if available in EHR or on provided paperwork).    Given the recent fall, CT head obtained which was negative for any ICH.  CBC noted to have leukocytosis.  Rectal temperature obtained patient was noted to be febrile.  UA positive for urinary tract infection.  Code sepsis was initiated and patient was started on empiric antibiotics.  She did not require 30 cc/kg of IV fluids however she did require admission for further management.  Final Clinical Impression(s) / ED Diagnoses Final diagnoses:  Sepsis, due to unspecified organism [facility])      This chart was dictated using voice recognition software.  Despite best efforts to  proofread,  errors can occur which can change the documentation meaning.     Fatima Blank, MD 06/02/17 2351

## 2017-06-02 NOTE — Progress Notes (Signed)
Pharmacy Antibiotic Note  Saira Kramme is a 71 y.o. female admitted on 06/02/2017 with UTI.  Pharmacy has been consulted for levaquin dosing. PMH recurring UTI. WBC 15.9, Tmax 101.2, LA 1.57, CrCl ~33 ml/min  Plan: Levaquin 756m IV q48 hours  Height: _0  (154.9 cm) Weight: 162 lb (73.5 kg) IBW/kg (Calculated) : 47.8  Temp (24hrs), Avg:100.5 F (38.1 C), Min:99.8 F (37.7 C), Max:101.2 F (38.4 C)  Recent Labs  Lab 06/02/17 1903 06/02/17 2130  WBC 15.9*  --   CREATININE 1.45*  --   LATICACIDVEN  --  1.57    Estimated Creatinine Clearance: 33.1 mL/min (A) (by C-G formula based on SCr of 1.45 mg/dL (H)).    Allergies  Allergen Reactions  . Amoxicillin Anaphylaxis, Hives and Rash    Has patient had a PCN reaction causing immediate rash, facial/tongue/throat swelling, SOB or lightheadedness with hypotension: YES Positive reaction causing SEVERE RASH INVOLVING MUCUS MEMBRANES/SKIN NECROSIS: YES Reaction that required HOSPITALIZATION: YES Reaction occurring within the last 10 years: NO    Thank you for allowing pharmacy to be a part of this patient's care.  TJodean LimaRudisill 06/02/2017 10:18 PM

## 2017-06-02 NOTE — Progress Notes (Signed)
RT to room to collect ABG. Pt stating "no" when I inform her that I am there to collect blood. Pt currently being transported to CT. RT will check back.

## 2017-06-02 NOTE — ED Triage Notes (Signed)
Pt arrived via [facility] from [name] where pt had unwitnessed fall today around 8am. Pt was evaluated by staff and then went out of the facility for a doctors apt. Upon returning staff reports that she was more confused than normal and sluggish. CBG 272 . On 3L [place] chronic.

## 2017-06-03 DIAGNOSIS — J9611 Chronic respiratory failure with hypoxia: Secondary | ICD-10-CM | POA: Diagnosis present

## 2017-06-03 DIAGNOSIS — W19XXXA Unspecified fall, initial encounter: Secondary | ICD-10-CM | POA: Diagnosis present

## 2017-06-03 DIAGNOSIS — C3431 Malignant neoplasm of lower lobe, right bronchus or lung: Secondary | ICD-10-CM

## 2017-06-03 DIAGNOSIS — E876 Hypokalemia: Secondary | ICD-10-CM | POA: Diagnosis present

## 2017-06-03 LAB — BASIC METABOLIC PANEL
ANION GAP: 10 (ref 5–15)
BUN: 23 mg/dL — ABNORMAL HIGH (ref 6–20)
CO2: 30 mmol/L (ref 22–32)
Calcium: 9 mg/dL (ref 8.9–10.3)
Chloride: 94 mmol/L — ABNORMAL LOW (ref 101–111)
Creatinine, Ser: 1.34 mg/dL — ABNORMAL HIGH (ref 0.44–1.00)
GFR, EST AFRICAN AMERICAN: 45 mL/min — AB (ref >60)
GFR, EST NON AFRICAN AMERICAN: 39 mL/min — AB (ref >60)
GLUCOSE: 257 mg/dL — AB (ref 65–99)
POTASSIUM: 2.9 mmol/L — AB (ref 3.5–5.1)
Sodium: 134 mmol/L — ABNORMAL LOW (ref 135–145)

## 2017-06-03 LAB — GLUCOSE, CAPILLARY
GLUCOSE-CAPILLARY: 262 mg/dL — AB (ref 65–99)
Glucose-Capillary: 192 mg/dL — ABNORMAL HIGH (ref 65–99)

## 2017-06-03 LAB — CBC
HCT: 40.3 % (ref 36.0–46.0)
Hemoglobin: 13.1 g/dL (ref 12.0–15.0)
MCH: 25.9 pg — ABNORMAL LOW (ref 26.0–34.0)
MCHC: 32.5 g/dL (ref 30.0–36.0)
MCV: 79.6 fL (ref 78.0–100.0)
PLATELETS: 115 10*3/uL — AB (ref 150–400)
RBC: 5.06 MIL/uL (ref 3.87–5.11)
RDW: 17.2 % — ABNORMAL HIGH (ref 11.5–15.5)
WBC: 13.2 10*3/uL — AB (ref 4.0–10.5)

## 2017-06-03 LAB — PROCALCITONIN: Procalcitonin: 0.75 ng/mL

## 2017-06-03 LAB — MAGNESIUM: MAGNESIUM: 2 mg/dL (ref 1.7–2.4)

## 2017-06-03 LAB — MRSA PCR SCREENING: MRSA BY PCR: NEGATIVE

## 2017-06-03 LAB — TROPONIN I: Troponin I: 0.05 ng/mL (ref <0.03)

## 2017-06-03 LAB — D-DIMER, QUANTITATIVE: D-Dimer, Quant: 1.63 ug/mL-FEU — ABNORMAL HIGH (ref 0.00–0.50)

## 2017-06-03 MED ORDER — ENOXAPARIN SODIUM 40 MG/0.4ML ~~LOC~~ SOLN
35.0000 mg | Freq: Once | SUBCUTANEOUS | Status: AC
  Administered 2017-06-03: 35 mg via SUBCUTANEOUS
  Filled 2017-06-03: qty 0.4

## 2017-06-03 MED ORDER — POTASSIUM CHLORIDE CRYS ER 20 MEQ PO TBCR
40.0000 meq | EXTENDED_RELEASE_TABLET | ORAL | Status: AC
  Administered 2017-06-03: 40 meq via ORAL
  Filled 2017-06-03: qty 2

## 2017-06-03 MED ORDER — ENOXAPARIN SODIUM 80 MG/0.8ML ~~LOC~~ SOLN
75.0000 mg | Freq: Two times a day (BID) | SUBCUTANEOUS | Status: DC
  Administered 2017-06-04 – 2017-06-06 (×5): 75 mg via SUBCUTANEOUS
  Filled 2017-06-03 (×5): qty 0.8

## 2017-06-03 MED ORDER — ORAL CARE MOUTH RINSE
15.0000 mL | Freq: Two times a day (BID) | OROMUCOSAL | Status: DC
  Administered 2017-06-03 – 2017-06-06 (×7): 15 mL via OROMUCOSAL

## 2017-06-03 MED ORDER — INSULIN ASPART 100 UNIT/ML ~~LOC~~ SOLN
0.0000 [IU] | Freq: Three times a day (TID) | SUBCUTANEOUS | Status: DC
  Administered 2017-06-04: 2 [IU] via SUBCUTANEOUS
  Administered 2017-06-04: 3 [IU] via SUBCUTANEOUS
  Administered 2017-06-04 – 2017-06-05 (×4): 2 [IU] via SUBCUTANEOUS
  Administered 2017-06-06: 3 [IU] via SUBCUTANEOUS
  Administered 2017-06-06: 2 [IU] via SUBCUTANEOUS

## 2017-06-03 MED ORDER — POTASSIUM CHLORIDE CRYS ER 20 MEQ PO TBCR
40.0000 meq | EXTENDED_RELEASE_TABLET | Freq: Two times a day (BID) | ORAL | Status: AC
  Administered 2017-06-03 – 2017-06-04 (×3): 40 meq via ORAL
  Filled 2017-06-03 (×4): qty 2

## 2017-06-03 NOTE — ED Notes (Signed)
Pt placed in hospital bed for comfort. Pt informed of bed status and holding in ED for night

## 2017-06-03 NOTE — Evaluation (Signed)
Physical Therapy Evaluation Patient Details Name: Amy Shepard MRN: [id number] DOB: 12/20/1945 Today's Date: 06/03/2017   History of Present Illness  Pt is a 71 y/o female admitted secondary to AMS attributed to a UTI and sepsis. Pt also with recent/frequent falls. PMH including but not limited to CHF, COPD, lung cancer, DM.  Clinical Impression  Pt presented supine in bed with HOB elevated, awake and willing to participate in therapy session. Prior to admission, pt was at [facility] receiving therapy services. Pt was confused throughout and unable to provide reliable information regarding PLOF with no family/caregivers present to provide information. Pt currently requires min A with bed mobility and mod A with transfers with use of RW. Pt would continue to benefit from skilled physical therapy services at this time while admitted and after d/c to address the below listed limitations in order to improve overall safety and independence with functional mobility.     Follow Up Recommendations SNF    Equipment Recommendations  None recommended by PT    Recommendations for Other Services       Precautions / Restrictions Precautions Precautions: Fall Restrictions Weight Bearing Restrictions: Yes LLE Weight Bearing: Non weight bearing(per pt, no orders)      Mobility  Bed Mobility Overal bed mobility: Needs Assistance Bed Mobility: Supine to Sit;Sit to Supine     Supine to sit: Min assist Sit to supine: Min assist   General bed mobility comments: increased time, cueing for sequencing, assist to elevate trunk and to return bilateral LEs onto bed  Transfers Overall transfer level: Needs assistance Equipment used: Rolling walker (2 wheeled) Transfers: Sit to/from Stand Sit to Stand: Mod assist         General transfer comment: increased time and effort, assist to power into standing from sitting EOB; pt able to maintain NWB L LE independently  Ambulation/Gait                 Stairs            Wheelchair Mobility    Modified Rankin (Stroke Patients Only)       Balance Overall balance assessment: Needs assistance Sitting-balance support: Feet supported Sitting balance-Leahy Scale: Good     Standing balance support: Bilateral upper extremity supported Standing balance-Leahy Scale: Poor Standing balance comment: reliant on bilateral UEs on RW                             Pertinent Vitals/Pain Pain Assessment: No/denies pain    Home Living Family/patient expects to be discharged to:: Skilled nursing facility                 Additional Comments: pt from [name]    Prior Function Level of Independence: Needs assistance               Hand Dominance        Extremity/Trunk Assessment   Upper Extremity Assessment Upper Extremity Assessment: Generalized weakness    Lower Extremity Assessment Lower Extremity Assessment: Generalized weakness;LLE deficits/detail LLE Deficits / Details: per pt NWB; large ACE wrap around lower leg, ankle and foot LLE: Unable to fully assess due to immobilization       Communication   Communication: No difficulties  Cognition Arousal/Alertness: Awake/alert Behavior During Therapy: WFL for tasks assessed/performed Overall Cognitive Status: Impaired/Different from baseline Area of Impairment: Orientation;Attention;Memory;Following commands;Safety/judgement;Problem solving  Orientation Level: Disoriented to;Situation Current Attention Level: Selective Memory: Decreased short-term memory Following Commands: Follows one step commands consistently;Follows multi-step commands with increased time;Follows multi-step commands inconsistently Safety/Judgement: Decreased awareness of safety;Decreased awareness of deficits   Problem Solving: Slow processing;Decreased initiation;Difficulty sequencing;Requires verbal cues;Requires tactile cues         General Comments      Exercises     Assessment/Plan    PT Assessment Patient needs continued PT services  PT Problem List Decreased strength;Decreased balance;Decreased activity tolerance;Decreased mobility;Decreased coordination;Decreased knowledge of use of DME;Decreased safety awareness       PT Treatment Interventions DME instruction;Gait training;Functional mobility training;Stair training;Therapeutic activities;Therapeutic exercise;Balance training;Neuromuscular re-education;Patient/family education    PT Goals (Current goals can be found in the Care Plan section)  Acute Rehab PT Goals Patient Stated Goal: return to independence  PT Goal Formulation: With patient Time For Goal Achievement: 06/17/17 Potential to Achieve Goals: Fair    Frequency Min 2X/week   Barriers to discharge Decreased caregiver support;Other (comment)(pt from SNF but otherwise homeless) pt reported that she does not have a current home other than SNF ([name]) as she was evicted from her home     Co-evaluation               AM-PAC PT "6 Clicks" Daily Activity  Outcome Measure Difficulty turning over in bed (including adjusting bedclothes, sheets and blankets)?: A Lot Difficulty moving from lying on back to sitting on the side of the bed? : Unable Difficulty sitting down on and standing up from a chair with arms (e.g., wheelchair, bedside commode, etc,.)?: Unable Help needed moving to and from a bed to chair (including a wheelchair)?: A Lot Help needed walking in hospital room?: A Lot Help needed climbing 3-5 steps with a railing? : Total 6 Click Score: 9    End of Session Equipment Utilized During Treatment: Gait belt Activity Tolerance: Patient limited by fatigue Patient left: in bed;with call bell/phone within reach;with bed alarm set Nurse Communication: Mobility status PT Visit Diagnosis: Other abnormalities of gait and mobility (R26.89);History of falling (Z91.81)    Time:  8309-4076 PT Time Calculation (min) (ACUTE ONLY): 24 min   Charges:   PT Evaluation $PT Eval Moderate Complexity: 1 Mod PT Treatments $Therapeutic Activity: 8-22 mins   PT G Codes:        Dodge, PT, DPT Mignon 06/03/2017, 2:17 PM

## 2017-06-03 NOTE — Progress Notes (Signed)
Pt. C/o left sided chest pressure 7/10 during shift report. Pt. States she has this pain occasionally. VS obtained and WNL. EKG to be collected. On call for [facility] paged to make aware and also informed of pts. Elevated D-dimer results. RN awaiting new orders. Will continue to monitor pt. For changes in condition. Damian Buckles, Katherine Roan

## 2017-06-03 NOTE — Progress Notes (Signed)
ANTICOAGULATION CONSULT NOTE - Initial Consult  Pharmacy Consult for lovenox Indication: pulmonary embolus  Allergies  Allergen Reactions  . Amoxicillin Anaphylaxis, Hives and Rash    Has patient had a PCN reaction causing immediate rash, facial/tongue/throat swelling, SOB or lightheadedness with hypotension: YES Positive reaction causing SEVERE RASH INVOLVING MUCUS MEMBRANES/SKIN NECROSIS: YES Reaction that required HOSPITALIZATION: YES Reaction occurring within the last 10 years: NO    Patient Measurements: Height: _0  (154.9 cm) Weight: 167 lb 1.7 oz (75.8 kg) IBW/kg (Calculated) : 47.8   Vital Signs: Temp: 98.7 F (37.1 C) (11/21 1715) Temp Source: Oral (11/21 1715) BP: 100/53 (11/21 1915) Pulse Rate: 93 (11/21 1915)  Labs: Recent Labs    06/02/17 1903 06/03/17 0338  HGB 13.9 13.1  HCT 42.1 40.3  PLT 140* 115*  CREATININE 1.45* 1.34*    Estimated Creatinine Clearance: 36.4 mL/min (A) (by C-G formula based on SCr of 1.34 mg/dL (H)).   Medical History: Past Medical History:  Diagnosis Date  . Arthritis   . CAD (coronary artery disease)    a. Prior cath 2015 showed 40% prox AD, 50-50% mLAD, otherwise calcification but no obstruction in LCx/RCA.. Medical management recommended. b. 2017: low-risk NST.  [name] Chronic diastolic CHF (congestive heart failure) ([place])   . Chronic respiratory failure ([place])   . CKD (chronic kidney disease), stage III ([place])   . COPD (chronic obstructive pulmonary disease) ([place])   . Cor pulmonale ([name])    a. felt due to advanced COPD and noncompliance with O2.  . Depression   . Diabetes mellitus    [name]    dx  2008  . Hx of seasonal allergies   . Hypercholesteremia   . Hypertension    on no medications  . Lung cancer ([place])   . On home oxygen therapy    "2.5L; 24/7" (03/10/2017)  . Pericardial effusion    a. small-moderate in 07/2016.    Medications:  Medications Prior to Admission  Medication Sig Dispense Refill Last Dose  .  acetaminophen (TYLENOL) 325 MG tablet Take 650 mg by mouth every 6 (six) hours as needed (for pain or fever).   PRN at PRN  . aspirin EC 81 MG tablet Take 81 mg by mouth daily.    06/02/2017 at 0900  . Fluticasone-Salmeterol (ADVAIR) 250-50 MCG/DOSE AEPB Inhale 1 puff into the lungs 2 (two) times daily.   06/02/2017 at 0900  . furosemide (LASIX) 40 MG tablet Take 1.5 tablets (60 mg total) by mouth 2 (two) times daily. 30 tablet 2 06/02/2017 at 0800  . gabapentin (NEURONTIN) 300 MG capsule Take 300 mg by mouth 3 (three) times daily.   06/02/2017 at 1300  . guaiFENesin (MUCINEX) 600 MG 12 hr tablet Take 1 tablet (600 mg total) by mouth 2 (two) times daily as needed for cough or to loosen phlegm. 20 tablet 0 05/21/2017 at 0555  . insulin aspart (NOVOLOG) 100 UNIT/ML injection Inject 4 Units into the skin 3 (three) times daily with meals. (Patient taking differently: Inject 4 Units into the skin 3 (three) times daily with meals. Per sliding scale: BGL 101-150 = 2 units; 151-200 = 3 units; 201-250 = 5 units; 251-300 = 7 units; 301-350 = 9 units; >350 = 11 units; CALL MD IF BGL IS >400 OR <60) 10 mL 0 06/02/2017 at 1130  . Insulin Detemir (LEVEMIR FLEXTOUCH) 100 UNIT/ML Pen Inject 30 Units into the skin at bedtime.   06/01/2017 at 2100  . ipratropium-albuterol (DUONEB) 0.5-2.5 (3)  MG/3ML SOLN Take 3 mLs by nebulization every 4 (four) hours as needed (wheezing, Shortness of breath). (Patient taking differently: Take 3 mLs by nebulization every 4 (four) hours as needed (for wheezing or shortness of breath). ) 360 mL 0 06/02/2017 at 1249  . Ketotifen Fumarate (REFRESH EYE ITCH RELIEF OP) Place 1 drop into both eyes 2 (two) times daily.   06/02/2017 at 0900  . OXYGEN Inhale 3 L into the lungs continuous. FOR COPD   Continuous at Continuous  . rosuvastatin (CRESTOR) 40 MG tablet Take 1 tablet (40 mg total) by mouth daily at 6 PM. 90 tablet 3 06/01/2017 at 1800  . sertraline (ZOLOFT) 100 MG tablet Take 150 mg by  mouth daily.    06/02/2017 at 0800  . spironolactone (ALDACTONE) 25 MG tablet Take 1 tablet (25 mg total) by mouth daily. 30 tablet 0 06/02/2017 at 0900  . traMADol (ULTRAM) 50 MG tablet Take 50 mg by mouth every 8 (eight) hours as needed (for pain).   06/01/2017 at 2234  . insulin glargine (LANTUS) 100 UNIT/ML injection Inject 0.25 mLs (25 Units total) into the skin at bedtime. (Patient not taking: Reported on 06/02/2017)   Not Taking at Unknown time   Scheduled:  . aspirin EC  81 mg Oral Daily  . gabapentin  300 mg Oral TID  . [START ON 06/04/2017] insulin aspart  0-9 Units Subcutaneous TID WC  . insulin aspart  4 Units Subcutaneous TID WC  . insulin detemir  30 Units Subcutaneous QHS  . mouth rinse  15 mL Mouth Rinse BID  . mometasone-formoterol  2 puff Inhalation BID  . potassium chloride  40 mEq Oral BID    Assessment: 48 female with UTI vs PNA and found with elevated d-dimer to begin lovenox for concern of PE (patient noted with history of lung cancer). Lovenox 74m sq given at 1pm on 11/21. -hg= 13.1, plt= 115 -d-dimer= 1.63 -SCr= 1.34 (baseline ~ 1.2 1.4), CrCl ~ 37  Goal of Therapy:  Monitor platelets by anticoagulation protocol: Yes   Plan:  -Lovenox 350mnow (to complete an initial dose of 7585mthen begin 58m60m q12h -CBC every 3 days  AndrHildred Laserarm D 06/03/2017 7:42 PM

## 2017-06-03 NOTE — Progress Notes (Signed)
PROGRESS NOTE    Amy Shepard  XWD:[id number] DOB: Oct 30, 1945 DOA: 06/02/2017 PCP: Maurice Small, MD      Brief Narrative:  71 y.o. F with diastolic CHF last EF 84-53% with grade 1dFx in 02/2017, COPD, oxygen dependent 3L, lung cancer in remission, and DM type II; who presents to urgent facility after being found to be acutely altered.  Patient had reportedly fallen 2 days ago, and then fell again this morning at around 8 AM.  She was seen and her PCPs office and upon returning back to the facility was noted to be acutely altered and confused.  Reported dizziness, weakness, confusion, chills, nausea, and vomiting, without cough, sputum production or dysuria.  In ER, found to have atypical opacities on CXR as well as respiratory alkalosis, and leukocyturia.  Started on Levaquin for suspected sepsis from UTI.     Assessment & Plan:  Principal Problem:   Sepsis secondary to UTI [facility]) Active Problems:   Lung cancer ([place])   Type 2 diabetes mellitus with complication, with long-term current use of insulin ([name])   Chronic diastolic CHF (congestive heart failure) ([name])   Chronic respiratory failure with hypoxia ([name])   Falls   Hypokalemia   1. Sepsis, suspected UTI vs pneumonia Patient meets criteria given tachypnea, fever, leukocytosis, and evidence of organ dysfunction/altered mental status.Levaquin delivered in the ED.   -Follow urine and blood cultures -Trend procalc -Continue Levaquin dosed by RPh  2. Metabolic encephalopathy In setting of infection, hypokalemia, respiratory alkalosis  3. Respiratory alkalosis Tachypneic from sepsis vs airspace disease.  CXR shows atypical opacities, small effusion.  Suspect this is some pneumonia.   -Continue Levaquin -Check Ddimer, may need VQ  4. COPD with chronic hypoxic respiratory failure No wheezing. -Continue Dulera -Nebs PRN  5. Diabetes -Continue Levemir -Continue SSI -Continue gabapentin  6. CKD III Baseline Cr 1.2,  stable.       DVT prophylaxis: Lovenox Code Status: FULL Family Communication: None present Disposition Plan: To home with home health when able.  Still respiratory status poor, confused, hypoxic worse than baseline and with multiple electrolytes derangements.  Continue IV antibiotics and monitor respiratory status and cultures.     Consultants:   None  Procedures:   None  Antimicrobials:   Levaquin 11/20 >>    Subjective: Confused.  Tired.  Aware that she is at [facility], somewhat confused about other details of where she is.  Still dyspneic. No dysuria, no change in chronic urinary frequency.  No chest pain, hemoptysis, new fever, sputum production.   Objective: Vitals:   06/03/17 0437 06/03/17 0500 06/03/17 0623 06/03/17 1200  BP:  118/67 (!) 101/59 (!) 99/57  Pulse:  88 80 87  Resp:  _0 Temp: 98.8 F (37.1 C)  98.4 F (36.9 C) 97.7 F (36.5 C)  TempSrc: Oral  Oral Oral  SpO2:  92% 93% 100%  Weight:   75.8 kg (167 lb 1.7 oz)   Height:   5' 1" (1.549 m)     Intake/Output Summary (Last 24 hours) at 06/03/2017 1430 Last data filed at 06/03/2017 0700 Gross per 24 hour  Intake 1240 ml  Output -  Net 1240 ml   Filed Weights   06/02/17 1853 06/03/17 0623  Weight: 73.5 kg (162 lb) 75.8 kg (167 lb 1.7 oz)    Examination: General appearance: Elderly adult female, awake but sleepy, confused.   HEENT: Anicteric, conjunctiva pink, lids and lashes normal. No nasal deformity, discharge, epistaxis.  Lips  moist.   Skin: Warm and dry.  No jaundice.  No suspicious rashes or lesions. Cardiac: RRR, nl S1-S2, no murmurs appreciated.  Capillary refill is brisk.  JVP not visible.  No LE edema.  Radial pulses 2+ and symmetric. Respiratory: Tachypneic.  Weak.  Poor inspiratory effort. Coarse breath sounds thoruhgout.  No wheezes. Abdomen: Abdomen soft.  No TTP. No ascites, distension, hepatosplenomegaly.   MSK: No deformities or effusions.  Left foot in cast. Neuro:  Awake and alert.  EOMI, moves all extremities. Speech fluent.    Psych: Awake, but somewhat confused, answers slow.  Judgment and insight appear impaired.    Data Reviewed: I have personally reviewed following labs and imaging studies:  CBC: Recent Labs  Lab 06/02/17 1903 06/03/17 0338  WBC 15.9* 13.2*  NEUTROABS 14.4*  --   HGB 13.9 13.1  HCT 42.1 40.3  MCV 80.2 79.6  PLT 140* 979*   Basic Metabolic Panel: Recent Labs  Lab 06/02/17 1903 06/03/17 0338  NA 132* 134*  K 3.3* 2.9*  CL 89* 94*  CO2 30 30  GLUCOSE 243* 257*  BUN 24* 23*  CREATININE 1.45* 1.34*  CALCIUM 9.4 9.0  MG  --  2.0   GFR: Estimated Creatinine Clearance: 36.4 mL/min (A) (by C-G formula based on SCr of 1.34 mg/dL (H)). Liver Function Tests: Recent Labs  Lab 06/02/17 1903  AST 20  ALT 14  ALKPHOS 154*  BILITOT 0.8  PROT 7.3  ALBUMIN 3.1*   No results for input(s): LIPASE, AMYLASE in the last 168 hours. Recent Labs  Lab 06/02/17 1935  AMMONIA 25   Coagulation Profile: No results for input(s): INR, PROTIME in the last 168 hours. Cardiac Enzymes: No results for input(s): CKTOTAL, CKMB, CKMBINDEX, TROPONINI in the last 168 hours. BNP (last 3 results) Recent Labs    07/21/16 1210 07/29/16 1159 09/05/16 1131  PROBNP 2,696* 3,611* 2,929*   HbA1C: No results for input(s): HGBA1C in the last 72 hours. CBG: Recent Labs  Lab 06/03/17 0754 06/03/17 1216  GLUCAP 262* 192*   Lipid Profile: No results for input(s): CHOL, HDL, LDLCALC, TRIG, CHOLHDL, LDLDIRECT in the last 72 hours. Thyroid Function Tests: No results for input(s): TSH, T4TOTAL, FREET4, T3FREE, THYROIDAB in the last 72 hours. Anemia Panel: No results for input(s): VITAMINB12, FOLATE, FERRITIN, TIBC, IRON, RETICCTPCT in the last 72 hours. Urine analysis:    Component Value Date/Time   COLORURINE YELLOW 06/02/2017 2055   APPEARANCEUR CLOUDY (A) 06/02/2017 2055   LABSPEC 1.018 06/02/2017 2055   PHURINE 5.0 06/02/2017  2055   GLUCOSEU NEGATIVE 06/02/2017 2055   HGBUR SMALL (A) 06/02/2017 2055   BILIRUBINUR NEGATIVE 06/02/2017 2055   KETONESUR NEGATIVE 06/02/2017 2055   PROTEINUR 100 (A) 06/02/2017 2055   UROBILINOGEN 0.2 02/10/2013 1041   NITRITE POSITIVE (A) 06/02/2017 2055   LEUKOCYTESUR MODERATE (A) 06/02/2017 2055   Sepsis Labs: _0 (procalcitonin:4,lacticidven:4)  ) Recent Results (from the past 240 hour(s))  MRSA PCR Screening     Status: None   Collection Time: 06/03/17  7:31 AM  Result Value Ref Range Status   MRSA by PCR NEGATIVE NEGATIVE Final    Comment:        The GeneXpert MRSA Assay (FDA approved for NASAL specimens only), is one component of a comprehensive MRSA colonization surveillance program. It is not intended to diagnose MRSA infection nor to guide or monitor treatment for MRSA infections.          Radiology Studies: Dg Chest 2 View  Result Date: 06/02/2017 CLINICAL DATA:  71 year old female with cough x3 days. EXAM: CHEST  2 VIEW COMPARISON:  Chest radiograph dated 05/03/2017 FINDINGS: There is cardiomegaly with mild vascular congestion. Hazy density at the right lung base likely atelectatic changes. Trace right pleural effusion is not excluded. There is no focal consolidation or pneumothorax. There is atherosclerotic calcification of the aortic arch. Osteopenia with degenerative changes of the spine. No acute osseous pathology. IMPRESSION: Cardiomegaly with mild vascular congestion and possible trace right pleural effusion. Electronically Signed   By: Anner Crete M.D.   On: 06/02/2017 20:31   Ct Head Wo Contrast  Result Date: 06/02/2017 CLINICAL DATA:  Unwitnessed fall with  confusion EXAM: CT HEAD WITHOUT CONTRAST TECHNIQUE: Contiguous axial images were obtained from the base of the skull through the vertex without intravenous contrast. COMPARISON:  07/11/2008 FINDINGS: Brain: Generalized atrophic changes are noted. Mild chronic white matter ischemic  change is seen. No findings to suggest acute hemorrhage, acute infarction or space-occupying mass lesion are noted. Vascular: No hyperdense vessel or unexpected calcification. Skull: Normal. Negative for fracture or focal lesion. Sinuses/Orbits: Mucosal retention cyst in the sphenoid sinus. Other: None. IMPRESSION: Chronic changes without acute abnormality. Electronically Signed   By: Inez Catalina M.D.   On: 06/02/2017 20:13        Scheduled Meds: . aspirin EC  81 mg Oral Daily  . enoxaparin (LOVENOX) injection  40 mg Subcutaneous Q24H  . gabapentin  300 mg Oral TID  . insulin aspart  4 Units Subcutaneous TID WC  . insulin detemir  30 Units Subcutaneous QHS  . mouth rinse  15 mL Mouth Rinse BID  . mometasone-formoterol  2 puff Inhalation BID  . potassium chloride  40 mEq Oral BID   Continuous Infusions: . [START ON 06/04/2017] levofloxacin (LEVAQUIN) IV       LOS: 1 day    Time spent: 45 minutes    Edwin Dada, MD [facility] Pager [phone]  If 7PM-7AM, please contact night-coverage [email or web address] Password [email or web address] 06/03/2017, 2:30 PM

## 2017-06-03 NOTE — Progress Notes (Signed)
Pt. Arrived to the unit from ED in alert and stable condition. Pt. Oriented to the room and placed on telemetry. CCMD notified. Call light placd within reach. RN will continue to moniotpr pt. For changes in condition. Kalan Yeley, Katherine Roan

## 2017-06-03 NOTE — Progress Notes (Addendum)
Inpatient Diabetes Program Recommendations  AACE/ADA: New Consensus Statement on Inpatient Glycemic Control (2015)  Target Ranges:  Prepandial:   less than 140 mg/dL      Peak postprandial:   less than 180 mg/dL (1-2 hours)      Critically ill patients:  140 - 180 mg/dL  Results for KHALIDAH, HERBOLD (MRN [id number]) as of 06/03/2017 11:18  Ref. Range 06/03/2017 07:54  Glucose-Capillary Latest Ref Range: 65 - 99 mg/dL 262 (H)   Results for XIOMARA, SEVILLANO (MRN [id number]) as of 06/03/2017 11:18  Ref. Range 06/02/2017 19:03 06/03/2017 03:38  Glucose Latest Ref Range: 65 - 99 mg/dL 243 (H) 257 (H)   Review of Glycemic Control  Outpatient Diabetes medications: Levemir 30 units QHS, Novolog 4 units TID with meals Current orders for Inpatient glycemic control: Levemir 30 units QHS, Novolog 4 units TID with meals   Inpatient Diabetes Program Recommendations: Correction (SSI): Please consider ordering Novolog 0-9 units TID with meals and Novolog 0-5 units QHS for correction while inpatient.  Thanks, Barnie Alderman, RN, MSN, CDE Diabetes Coordinator Inpatient Diabetes Program [phone] (Team Pager from 8am to 5pm)

## 2017-06-03 NOTE — Clinical Social Work Note (Signed)
Clinical Social Work Assessment  Patient Details  Name: Amy Shepard MRN: [id number] Date of Birth: 09-26-1945  Date of referral:  06/03/17               Reason for consult:  Discharge Planning                Permission sought to share information with:  Chartered certified accountant granted to share information::  Yes, Verbal Permission Granted  Name::        Agency::  [name]  Relationship::     Contact Information:     Housing/Transportation Living arrangements for the past 2 months:  Amy, Shepard of Information:  Patient, Medical Team, Facility Patient Interpreter Needed:  None Criminal Activity/Legal Involvement Pertinent to Current Situation/Hospitalization:  No - Comment as needed Significant Relationships:  Adult Children Lives with:  Self Do you feel safe going back to the place where you live?  No Need for family participation in patient care:  Yes (Comment)  Care giving concerns:  Patient is a short-term rehab resident at [facility].   Social Worker assessment / plan:  CSW met with patient. No supports at bedside. CSW introduced role and explained that discharge planning would be discussed. Patient confirmed she was admitted from [name]. Patient was discharged there on 10/26 and per admissions coordinator, is in her copay days. Patient does not have a secondary insurance to cover this and says she cannot afford to pay out of pocket. Copays are around $160 per day. CSW asked about applying for Medicaid but patient stated she makes too much money to qualify. Patient was evicted from her home prior to her past admission. She states she has been looking into an apartment at [facility]. CSW will follow up once PT assesses her. No further concerns. CSW encouraged patient to contact CSW as needed. CSW will continue to follow patient for support and facilitate discharge if needed once medically  stable.  Employment status:  Retired Forensic scientist:  Medicare PT Recommendations:  Not assessed at this time [place] / Referral to community resources:  [name]  Patient/Family's Response to care:  Patient unsure of disposition plan since she cannot afford copay at [facility]. Patient's daughter supportive and involved in patient's care. Patient appreciated social work intervention.  Patient/Family's Understanding of and Emotional Response to Diagnosis, Current Treatment, and Prognosis:  Patient has a good understanding of the reason for admission. Patient appears happy with hospital care.  Emotional Assessment Appearance:  Appears stated age Attitude/Demeanor/Rapport:  Other(Pleasant) Affect (typically observed):  Accepting, Appropriate, Calm, Pleasant Orientation:  Oriented to Self, Oriented to Place, Oriented to  Time, Oriented to Situation Alcohol / Substance use:  Never Used Psych involvement (Current and /or in the community):  No (Comment)  Discharge Needs  Concerns to be addressed:  Care Coordination Readmission within the last 30 days:  Yes Current discharge risk:  Dependent with Mobility, Homeless Barriers to Discharge:  Continued Medical Work up, Homeless with medical needs, Other(In copay days at [facility])   Amy Chroman, LCSW 06/03/2017, 10:21 AM

## 2017-06-04 ENCOUNTER — Inpatient Hospital Stay (HOSPITAL_COMMUNITY): Payer: Medicare Other

## 2017-06-04 ENCOUNTER — Encounter (HOSPITAL_COMMUNITY): Payer: Self-pay | Admitting: *Deleted

## 2017-06-04 LAB — BASIC METABOLIC PANEL
Anion gap: 7 (ref 5–15)
BUN: 24 mg/dL — ABNORMAL HIGH (ref 6–20)
CHLORIDE: 99 mmol/L — AB (ref 101–111)
CO2: 29 mmol/L (ref 22–32)
CREATININE: 1.38 mg/dL — AB (ref 0.44–1.00)
Calcium: 9.1 mg/dL (ref 8.9–10.3)
GFR calc non Af Amer: 38 mL/min — ABNORMAL LOW (ref >60)
GFR, EST AFRICAN AMERICAN: 44 mL/min — AB (ref >60)
Glucose, Bld: 173 mg/dL — ABNORMAL HIGH (ref 65–99)
Potassium: 4.5 mmol/L (ref 3.5–5.1)
SODIUM: 135 mmol/L (ref 135–145)

## 2017-06-04 LAB — CBC
HCT: 40.7 % (ref 36.0–46.0)
Hemoglobin: 12.9 g/dL (ref 12.0–15.0)
MCH: 26 pg (ref 26.0–34.0)
MCHC: 31.7 g/dL (ref 30.0–36.0)
MCV: 81.9 fL (ref 78.0–100.0)
PLATELETS: 124 10*3/uL — AB (ref 150–400)
RBC: 4.97 MIL/uL (ref 3.87–5.11)
RDW: 17.7 % — ABNORMAL HIGH (ref 11.5–15.5)
WBC: 10.2 10*3/uL (ref 4.0–10.5)

## 2017-06-04 LAB — GLUCOSE, CAPILLARY
GLUCOSE-CAPILLARY: 166 mg/dL — AB (ref 65–99)
GLUCOSE-CAPILLARY: 191 mg/dL — AB (ref 65–99)
GLUCOSE-CAPILLARY: 205 mg/dL — AB (ref 65–99)
Glucose-Capillary: 135 mg/dL — ABNORMAL HIGH (ref 65–99)
Glucose-Capillary: 192 mg/dL — ABNORMAL HIGH (ref 65–99)
Glucose-Capillary: 239 mg/dL — ABNORMAL HIGH (ref 65–99)

## 2017-06-04 LAB — TROPONIN I: Troponin I: 0.06 ng/mL (ref <0.03)

## 2017-06-04 MED ORDER — TECHNETIUM TC 99M DIETHYLENETRIAME-PENTAACETIC ACID
30.0000 | Freq: Once | INTRAVENOUS | Status: AC | PRN
  Administered 2017-06-04: 30 via RESPIRATORY_TRACT

## 2017-06-04 MED ORDER — TECHNETIUM TO 99M ALBUMIN AGGREGATED
4.3000 | Freq: Once | INTRAVENOUS | Status: AC | PRN
  Administered 2017-06-04: 4.3 via INTRAVENOUS

## 2017-06-04 NOTE — Progress Notes (Signed)
Cath procedure noted to have been cancelled PTA on 05/19/17.  Cath consent orders were reordered in error and are not r/t this admission at this time. Cardiology consult pending until Friday 06/05/17 per attending MD Progress Note.

## 2017-06-04 NOTE — Progress Notes (Signed)
PROGRESS NOTE    Amy Shepard  GUR:[id number] DOB: 02/22/1946 DOA: 06/02/2017 PCP: Maurice Small, MD      Brief Narrative:  71 y.o. F with diastolic CHF last EF 28-31% with grade 1dFx in 02/2017, COPD, oxygen dependent 3L, lung cancer in remission, and DM type II; who presents to urgent facility after being found to be acutely altered.  Patient had reportedly fallen 2 days ago, and then fell again this morning at around 8 AM.  She was seen and her PCPs office and upon returning back to the facility was noted to be acutely altered and confused.  Reported dizziness, weakness, confusion, chills, nausea, and vomiting, without cough, sputum production or dysuria.  In ER, found to have atypical opacities on CXR as well as respiratory alkalosis, and leukocyturia.  Started on Levaquin for suspected sepsis from UTI.     Assessment & Plan:  Principal Problem:   Sepsis secondary to UTI [facility]) Active Problems:   Lung cancer ([place])   Type 2 diabetes mellitus with complication, with long-term current use of insulin ([name])   Chronic diastolic CHF (congestive heart failure) ([name])   Chronic respiratory failure with hypoxia ([name])   Falls   Hypokalemia   1. Possible Sepsis, suspected UTI vs pneumonia Patient meets criteria given tachypnea, fever, leukocytosis, and evidence of organ dysfunction/altered mental status.  Levaquin delivered in the ED.   Urine and blood still negative cultures. -Follow urine and blood cultures -Trend procalc -Continue Levaquin dosed by RPh  2. Metabolic encephalopathy In setting of infection, hypokalemia, respiratory alkalosis. Resolved.  3. Respiratory alkalosis AA gradient up, suspect either airspace disease/pneumonia or PE.  CXR shows atypical opacities, small effusion.  Suspect this is some pneumonia.   Dimer elevated, started Lovenox. -Continue Lovenox, Levaquin -Obtain VQ  4. Elevated troponin Differential includes NSTEMI, type 2 demand NSTEMI, and PE. -On  Lovenox -F/u echo -Will consult Cardiology Friday when Echo results return  5. COPD with chronic hypoxic respiratory failure No wheezing. -Continue Dulera -Nebs PRN  6. Diabetes -Continue Levemir -Continue SSI -Continue gabapentin  7. CKD III Baseline Cr 1.2, stable.       DVT prophylaxis: Lovenox, full dose Code Status: FULL Family Communication: None present Disposition Plan: To home with home health when able.  Still respiratory status poor, hypoxic worse than baseline and with multiple electrolytes derangements, now on new anticoagulant.  Continue IV antibiotics and monitor respiratory status and cultures.     Consultants:   None  Procedures:   None  Antimicrobials:   Levaquin 11/20 >>    Subjective: Oriented.  Tired.  Still dyspneic. No dysuria, no change in chronic urinary frequency.  Chest pain noted overnight, chest pressure.  No hemoptysis, new fever, sputum production.   Objective: Vitals:   06/03/17 2015 06/04/17 0500 06/04/17 0557 06/04/17 0751  BP:   134/81   Pulse: 93  82   Resp: 18  14   Temp:   97.6 F (36.4 C)   TempSrc:   Oral   SpO2: 91%  94% 94%  Weight:  75.9 kg (167 lb 6.4 oz)    Height:        Intake/Output Summary (Last 24 hours) at 06/04/2017 0913 Last data filed at 06/04/2017 0500 Gross per 24 hour  Intake 480 ml  Output 100 ml  Net 380 ml   Filed Weights   06/02/17 1853 06/03/17 0623 06/04/17 0500  Weight: 73.5 kg (162 lb) 75.8 kg (167 lb 1.7 oz) 75.9 kg (167 lb  6.4 oz)    Examination: General appearance: Elderly adult female, awake interactive  HEENT: Anicteric, conjunctiva pink, lids and lashes normal. No nasal deformity, discharge, epistaxis.  Lips moist.   Skin: Warm and dry.  No jaundice.  No suspicious rashes or lesions. Cardiac: RRR, nl S1-S2, no murmurs appreciated.  Capillary refill is brisk.  JVP not visible.  No LE edema.  Radial pulses 2+ and symmetric. Respiratory: Tachypneic.  Weak.  Poor inspiratory  effort. Rales in left base.  No wheezes. Abdomen: Abdomen soft.  No TTP. No ascites, distension, hepatosplenomegaly.   MSK: No deformities or effusions.  Left foot in cast. Neuro: Awake and alert.  EOMI, moves all extremities. Speech fluent.    Psych: Awake, but somewhat confused, answers slow.  Judgment and insight appear impaired.    Data Reviewed: I have personally reviewed following labs and imaging studies:  CBC: Recent Labs  Lab 06/02/17 1903 06/03/17 0338 06/04/17 0336  WBC 15.9* 13.2* 10.2  NEUTROABS 14.4*  --   --   HGB 13.9 13.1 12.9  HCT 42.1 40.3 40.7  MCV 80.2 79.6 81.9  PLT 140* 115* 916*   Basic Metabolic Panel: Recent Labs  Lab 06/02/17 1903 06/03/17 0338 06/04/17 0336  NA 132* 134* 135  K 3.3* 2.9* 4.5  CL 89* 94* 99*  CO2 _0 GLUCOSE 243* 257* 173*  BUN 24* 23* 24*  CREATININE 1.45* 1.34* 1.38*  CALCIUM 9.4 9.0 9.1  MG  --  2.0  --    GFR: Estimated Creatinine Clearance: 35.3 mL/min (A) (by C-G formula based on SCr of 1.38 mg/dL (H)). Liver Function Tests: Recent Labs  Lab 06/02/17 1903  AST 20  ALT 14  ALKPHOS 154*  BILITOT 0.8  PROT 7.3  ALBUMIN 3.1*   No results for input(s): LIPASE, AMYLASE in the last 168 hours. Recent Labs  Lab 06/02/17 1935  AMMONIA 25   Coagulation Profile: No results for input(s): INR, PROTIME in the last 168 hours. Cardiac Enzymes: Recent Labs  Lab 06/03/17 2003 06/04/17 0635  TROPONINI 0.05* 0.06*   BNP (last 3 results) Recent Labs    07/21/16 1210 07/29/16 1159 09/05/16 1131  PROBNP 2,696* 3,611* 2,929*   HbA1C: No results for input(s): HGBA1C in the last 72 hours. CBG: Recent Labs  Lab 06/03/17 0754 06/03/17 1216 06/03/17 1652 06/04/17 0050  GLUCAP 262* 192* 135* 205*   Lipid Profile: No results for input(s): CHOL, HDL, LDLCALC, TRIG, CHOLHDL, LDLDIRECT in the last 72 hours. Thyroid Function Tests: No results for input(s): TSH, T4TOTAL, FREET4, T3FREE, THYROIDAB in the last  72 hours. Anemia Panel: No results for input(s): VITAMINB12, FOLATE, FERRITIN, TIBC, IRON, RETICCTPCT in the last 72 hours. Urine analysis:    Component Value Date/Time   COLORURINE YELLOW 06/02/2017 2055   APPEARANCEUR CLOUDY (A) 06/02/2017 2055   LABSPEC 1.018 06/02/2017 2055   PHURINE 5.0 06/02/2017 2055   GLUCOSEU NEGATIVE 06/02/2017 2055   HGBUR SMALL (A) 06/02/2017 2055   BILIRUBINUR NEGATIVE 06/02/2017 2055   KETONESUR NEGATIVE 06/02/2017 2055   PROTEINUR 100 (A) 06/02/2017 2055   UROBILINOGEN 0.2 02/10/2013 1041   NITRITE POSITIVE (A) 06/02/2017 2055   LEUKOCYTESUR MODERATE (A) 06/02/2017 2055   Sepsis Labs: _1 (procalcitonin:4,lacticidven:4)  ) Recent Results (from the past 240 hour(s))  Blood Culture (routine x 2)     Status: None (Preliminary result)   Collection Time: 06/02/17  9:57 PM  Result Value Ref Range Status   Specimen Description BLOOD LEFT HAND  Final   Special Requests IN PEDIATRIC BOTTLE Blood Culture adequate volume  Final   Culture NO GROWTH < 24 HOURS  Final   Report Status PENDING  Incomplete  Blood Culture (routine x 2)     Status: None (Preliminary result)   Collection Time: 06/02/17 10:02 PM  Result Value Ref Range Status   Specimen Description BLOOD LEFT ANTECUBITAL  Final   Special Requests   Final    BOTTLES DRAWN AEROBIC AND ANAEROBIC Blood Culture adequate volume   Culture NO GROWTH < 24 HOURS  Final   Report Status PENDING  Incomplete  MRSA PCR Screening     Status: None   Collection Time: 06/03/17  7:31 AM  Result Value Ref Range Status   MRSA by PCR NEGATIVE NEGATIVE Final    Comment:        The GeneXpert MRSA Assay (FDA approved for NASAL specimens only), is one component of a comprehensive MRSA colonization surveillance program. It is not intended to diagnose MRSA infection nor to guide or monitor treatment for MRSA infections.          Radiology Studies: Dg Chest 2 View  Result Date: 06/02/2017 CLINICAL  DATA:  71 year old female with cough x3 days. EXAM: CHEST  2 VIEW COMPARISON:  Chest radiograph dated 05/03/2017 FINDINGS: There is cardiomegaly with mild vascular congestion. Hazy density at the right lung base likely atelectatic changes. Trace right pleural effusion is not excluded. There is no focal consolidation or pneumothorax. There is atherosclerotic calcification of the aortic arch. Osteopenia with degenerative changes of the spine. No acute osseous pathology. IMPRESSION: Cardiomegaly with mild vascular congestion and possible trace right pleural effusion. Electronically Signed   By: Anner Crete M.D.   On: 06/02/2017 20:31   Ct Head Wo Contrast  Result Date: 06/02/2017 CLINICAL DATA:  Unwitnessed fall with  confusion EXAM: CT HEAD WITHOUT CONTRAST TECHNIQUE: Contiguous axial images were obtained from the base of the skull through the vertex without intravenous contrast. COMPARISON:  07/11/2008 FINDINGS: Brain: Generalized atrophic changes are noted. Mild chronic white matter ischemic change is seen. No findings to suggest acute hemorrhage, acute infarction or space-occupying mass lesion are noted. Vascular: No hyperdense vessel or unexpected calcification. Skull: Normal. Negative for fracture or focal lesion. Sinuses/Orbits: Mucosal retention cyst in the sphenoid sinus. Other: None. IMPRESSION: Chronic changes without acute abnormality. Electronically Signed   By: Inez Catalina M.D.   On: 06/02/2017 20:13        Scheduled Meds: . aspirin EC  81 mg Oral Daily  . enoxaparin (LOVENOX) injection  75 mg Subcutaneous Q12H  . gabapentin  300 mg Oral TID  . insulin aspart  0-9 Units Subcutaneous TID WC  . insulin aspart  4 Units Subcutaneous TID WC  . insulin detemir  30 Units Subcutaneous QHS  . mouth rinse  15 mL Mouth Rinse BID  . mometasone-formoterol  2 puff Inhalation BID  . potassium chloride  40 mEq Oral BID   Continuous Infusions: . levofloxacin (LEVAQUIN) IV       LOS: 2 days     Time spent: 25 minutes    Edwin Dada, MD [facility] Pager [phone]  If 7PM-7AM, please contact night-coverage [email or web address] Password [facility] 06/04/2017, 9:13 AM

## 2017-06-04 NOTE — Plan of Care (Signed)
  Progressing Clinical Measurements: Will remain free from infection 06/04/2017 1129 - Progressing by Imagene Gurney, RN Diagnostic test results will improve 06/04/2017 1129 - Progressing by Imagene Gurney, RN Respiratory complications will improve 06/04/2017 1129 - Progressing by Imagene Gurney, RN Cardiovascular complication will be avoided 06/04/2017 1129 - Progressing by Imagene Gurney, RN

## 2017-06-05 ENCOUNTER — Other Ambulatory Visit (HOSPITAL_COMMUNITY): Payer: Medicare Other

## 2017-06-05 LAB — GLUCOSE, CAPILLARY
GLUCOSE-CAPILLARY: 151 mg/dL — AB (ref 65–99)
GLUCOSE-CAPILLARY: 195 mg/dL — AB (ref 65–99)
Glucose-Capillary: 165 mg/dL — ABNORMAL HIGH (ref 65–99)

## 2017-06-05 LAB — CBC
HCT: 41.2 % (ref 36.0–46.0)
Hemoglobin: 13.1 g/dL (ref 12.0–15.0)
MCH: 26.1 pg (ref 26.0–34.0)
MCHC: 31.8 g/dL (ref 30.0–36.0)
MCV: 82.2 fL (ref 78.0–100.0)
PLATELETS: 124 10*3/uL — AB (ref 150–400)
RBC: 5.01 MIL/uL (ref 3.87–5.11)
RDW: 18.6 % — ABNORMAL HIGH (ref 11.5–15.5)
WBC: 8.8 10*3/uL (ref 4.0–10.5)

## 2017-06-05 LAB — URINE CULTURE: Culture: >=100000 — AB

## 2017-06-05 LAB — PROCALCITONIN: Procalcitonin: 0.5 ng/mL

## 2017-06-05 NOTE — Progress Notes (Signed)
Physical Therapy Treatment Patient Details Name: Amy Shepard MRN: [id number] DOB: [birth year] Today's Date: 06/05/2017    History of Present Illness Pt is a 71 y/o female admitted secondary to AMS attributed to a UTI and sepsis. Pt also with recent/frequent falls. PMH including but not limited to CHF, COPD, lung cancer, DM.    PT Comments    Pt seated in recliner when entered room. Social worker spoke with pt during PT session. Pt required min assist to power up from chair and to maintain initial balance. Able to complete 5 sit to stands before fatigued. VCs for hand placement. Standing pivot to EOB and back to chair. Min assist to maintain balance while pivoting. Pt remains to require discharge to SNF due to continued LOB during transfer activities.   Follow Up Recommendations  SNF     Equipment Recommendations  None recommended by PT    Recommendations for Other Services       Precautions / Restrictions Precautions Precautions: Fall Restrictions Weight Bearing Restrictions: Yes LLE Weight Bearing: Non weight bearing    Mobility  Bed Mobility Overal bed mobility: Needs Assistance             General bed mobility comments: Seated in recliner.  Transfers Overall transfer level: Needs assistance Equipment used: Rolling walker (2 wheeled) Transfers: Sit to/from [facility] Sit to Stand: Min assist Stand pivot transfers: Min assist       General transfer comment: Increased time and effort. Assist for power up and balance upon standing. Pt able to maintain NWB L LE independently.  Ambulation/Gait                 Stairs            Wheelchair Mobility    Modified Rankin (Stroke Patients Only)       Balance Overall balance assessment: Needs assistance Sitting-balance support: Feet supported Sitting balance-Leahy Scale: Good Sitting balance - Comments: Able to sit EOB with no UE support while speaking with Education officer, museum.    Standing balance support: Bilateral upper extremity supported Standing balance-Leahy Scale: Poor Standing balance comment: Relies on bilateral UE support on RW when transfering.                            Cognition Arousal/Alertness: Awake/alert Behavior During Therapy: WFL for tasks assessed/performed Overall Cognitive Status: Within Functional Limits for tasks assessed                                        Exercises Other Exercises Other Exercises: 5 sit to stands    General Comments        Pertinent Vitals/Pain Pain Assessment: No/denies pain    Home Living                      Prior Function            PT Goals (current goals can now be found in the care plan section) Acute Rehab PT Goals Patient Stated Goal: to leave the hospital PT Goal Formulation: With patient Time For Goal Achievement: 06/17/17 Potential to Achieve Goals: Fair Progress towards PT goals: Progressing toward goals    Frequency    Min 2X/week      PT Plan Current plan remains appropriate    Co-evaluation  AM-PAC PT "6 Clicks" Daily Activity  Outcome Measure  Difficulty turning over in bed (including adjusting bedclothes, sheets and blankets)?: A Lot Difficulty moving from lying on back to sitting on the side of the bed? : Unable Difficulty sitting down on and standing up from a chair with arms (e.g., wheelchair, bedside commode, etc,.)?: Unable Help needed moving to and from a bed to chair (including a wheelchair)?: A Little Help needed walking in hospital room?: A Lot Help needed climbing 3-5 steps with a railing? : Total 6 Click Score: 10    End of Session Equipment Utilized During Treatment: Gait belt Activity Tolerance: Patient limited by fatigue Patient left: in chair;with call bell/phone within reach Nurse Communication: Mobility status PT Visit Diagnosis: Other abnormalities of gait and mobility (R26.89);History of  falling (Z91.81)     Time: 7915-0569 PT Time Calculation (min) (ACUTE ONLY): 23 min  Charges:  $Therapeutic Activity: 23-37 mins                    G Codes:       Janna Arch, SPTA   Janna Arch 06/05/2017, 4:48 PM

## 2017-06-05 NOTE — Progress Notes (Signed)
PROGRESS NOTE    Amy Shepard  YKL:[id number] DOB: September 18, 1945 DOA: 06/02/2017 PCP: Maurice Small, MD      Brief Narrative:  71 y.o. F with diastolic CHF last EF 90-09% with grade 1dFx in 02/2017, COPD, oxygen dependent 3L, lung cancer in remission, and DM type II; who presents to urgent facility after being found to be acutely altered.  Patient had reportedly fallen 2 days ago, and then fell again this morning at around 8 AM.  She was seen and her PCPs office and upon returning back to the facility was noted to be acutely altered and confused.  Reported dizziness, weakness, confusion, chills, nausea, and vomiting, without cough, sputum production or dysuria.  In ER, found to have atypical opacities on CXR as well as respiratory alkalosis, and leukocyturia.  Started on Levaquin for suspected sepsis from UTI.     Assessment & Plan:  Principal Problem:   Sepsis secondary to UTI [facility]) Active Problems:   Lung cancer ([place])   Type 2 diabetes mellitus with complication, with long-term current use of insulin ([name])   Chronic diastolic CHF (congestive heart failure) ([name])   Chronic respiratory failure with hypoxia ([name])   Falls   Hypokalemia   1. Possible Sepsis, suspected pneumonia rather than UTI Patient meets criteria given tachypnea, fever, leukocytosis, and evidence of organ dysfunction/altered mental status.  Levaquin delivered in the ED.   Blood still negative cultures.  Urine growing Graham. -Trend procalc -Continue Levaquin dosed by RPh -Tomorrow will be five days treatment   2. Metabolic encephalopathy In setting of infection, hypokalemia, respiratory alkalosis. Resolved.  3. Respiratory alkalosis AA gradient up, suspect either airspace disease/pneumonia or PE.  VQ negative. CXR shows atypical opacities, small effusion.  Suspect this is some pneumonia.   Dimer elevated, started Lovenox.  VQ negative. -Continue Levaquin -Discontinue Lovenox  4. Elevated troponin Suspect  type 2 demand NSTEMI -F/u echo  5. COPD with chronic hypoxic respiratory failure No wheezing. -Continue Dulera -Nebs PRN  6. Diabetes -Continue Levemir -Continue SSI -Continue gabapentin  7. CKD III Baseline Cr 1.2, stable.       DVT prophylaxis: Lovenox Code Status: FULL Family Communication: None present Disposition Plan: To SNF, if mental status improved, no tachycardia, or worsening WBC, likely tomorrow.  Respiratory status improved,still hypoxic.       Consultants:   None  Procedures:   None  Antimicrobials:   Levaquin 11/20 >>    Subjective: Oriented. Dyspnea improving. No dysuria, no change in chronic urinary frequency.  No new chest pain, hemoptysis, new fever, sputum production.   Objective: Vitals:   06/04/17 2347 06/05/17 0504 06/05/17 0827 06/05/17 1200  BP: (!) 98/52 110/63  105/69  Pulse: 76 73  77  Resp: _0 Temp: 99.2 F (37.3 C) 97.9 F (36.6 C)  98.6 F (37 C)  TempSrc: Oral Oral  Oral  SpO2: 93% 95% 92% 93%  Weight:  74.1 kg (163 lb 5.8 oz)    Height:        Intake/Output Summary (Last 24 hours) at 06/05/2017 1817 Last data filed at 06/05/2017 1130 Gross per 24 hour  Intake 150 ml  Output 650 ml  Net -500 ml   Filed Weights   06/03/17 0623 06/04/17 0500 06/05/17 0504  Weight: 75.8 kg (167 lb 1.7 oz) 75.9 kg (167 lb 6.4 oz) 74.1 kg (163 lb 5.8 oz)    Examination: General appearance: Elderly adult female, awake interactive  HEENT: Anicteric, conjunctiva pink, lids and  lashes normal. No nasal deformity, discharge, epistaxis.  Lips moist.   Skin: Warm and dry.  No jaundice.  No suspicious rashes or lesions. Cardiac: RRR, nl S1-S2, no murmurs appreciated.  Capillary refill is brisk.  JVP not visible.  No LE edema.  Radial pulses 2+ and symmetric. Respiratory: Scattered crackles, more in bases.  No wheezes. Abdomen: Abdomen soft.  No TTP. No ascites, distension, hepatosplenomegaly.   MSK: No deformities or effusions.   Left foot in cast. Neuro: Awake and alert.  EOMI, moves all extremities. Speech fluent.    Psych: Awake, oriented.  Judgment and insight appear improved.    Data Reviewed: I have personally reviewed following labs and imaging studies:  CBC: Recent Labs  Lab 06/02/17 1903 06/03/17 0338 06/04/17 0336 06/05/17 0416  WBC 15.9* 13.2* 10.2 8.8  NEUTROABS 14.4*  --   --   --   HGB 13.9 13.1 12.9 13.1  HCT 42.1 40.3 40.7 41.2  MCV 80.2 79.6 81.9 82.2  PLT 140* 115* 124* 700*   Basic Metabolic Panel: Recent Labs  Lab 06/02/17 1903 06/03/17 0338 06/04/17 0336  NA 132* 134* 135  K 3.3* 2.9* 4.5  CL 89* 94* 99*  CO2 _0 GLUCOSE 243* 257* 173*  BUN 24* 23* 24*  CREATININE 1.45* 1.34* 1.38*  CALCIUM 9.4 9.0 9.1  MG  --  2.0  --    GFR: Estimated Creatinine Clearance: 34.9 mL/min (A) (by C-G formula based on SCr of 1.38 mg/dL (H)). Liver Function Tests: Recent Labs  Lab 06/02/17 1903  AST 20  ALT 14  ALKPHOS 154*  BILITOT 0.8  PROT 7.3  ALBUMIN 3.1*   No results for input(s): LIPASE, AMYLASE in the last 168 hours. Recent Labs  Lab 06/02/17 1935  AMMONIA 25   Coagulation Profile: No results for input(s): INR, PROTIME in the last 168 hours. Cardiac Enzymes: Recent Labs  Lab 06/03/17 2003 06/04/17 0635  TROPONINI 0.05* 0.06*   BNP (last 3 results) Recent Labs    07/21/16 1210 07/29/16 1159 09/05/16 1131  PROBNP 2,696* 3,611* 2,929*   HbA1C: No results for input(s): HGBA1C in the last 72 hours. CBG: Recent Labs  Lab 06/04/17 1707 06/04/17 2106 06/05/17 0751 06/05/17 1153 06/05/17 1712  GLUCAP 191* 166* 151* 195* 165*   Lipid Profile: No results for input(s): CHOL, HDL, LDLCALC, TRIG, CHOLHDL, LDLDIRECT in the last 72 hours. Thyroid Function Tests: No results for input(s): TSH, T4TOTAL, FREET4, T3FREE, THYROIDAB in the last 72 hours. Anemia Panel: No results for input(s): VITAMINB12, FOLATE, FERRITIN, TIBC, IRON, RETICCTPCT in the last 72  hours. Urine analysis:    Component Value Date/Time   COLORURINE YELLOW 06/02/2017 2055   APPEARANCEUR CLOUDY (A) 06/02/2017 2055   LABSPEC 1.018 06/02/2017 2055   PHURINE 5.0 06/02/2017 2055   GLUCOSEU NEGATIVE 06/02/2017 2055   HGBUR SMALL (A) 06/02/2017 2055   BILIRUBINUR NEGATIVE 06/02/2017 2055   KETONESUR NEGATIVE 06/02/2017 2055   PROTEINUR 100 (A) 06/02/2017 2055   UROBILINOGEN 0.2 02/10/2013 1041   NITRITE POSITIVE (A) 06/02/2017 2055   LEUKOCYTESUR MODERATE (A) 06/02/2017 2055   Sepsis Labs: _1 (procalcitonin:4,lacticidven:4)  ) Recent Results (from the past 240 hour(s))  Culture, Urine     Status: Abnormal   Collection Time: 06/02/17  8:39 PM  Result Value Ref Range Status   Specimen Description URINE, CLEAN CATCH  Final   Special Requests NONE  Final   Culture >=100,000 COLONIES/mL ESCHERICHIA COLI (A)  Final   Report Status  06/05/2017 FINAL  Final   Organism ID, Bacteria ESCHERICHIA COLI (A)  Final      Susceptibility   Escherichia coli - MIC*    AMPICILLIN >=32 RESISTANT Resistant     CEFAZOLIN <=4 SENSITIVE Sensitive     CEFTRIAXONE <=1 SENSITIVE Sensitive     CIPROFLOXACIN <=0.25 SENSITIVE Sensitive     GENTAMICIN <=1 SENSITIVE Sensitive     IMIPENEM <=0.25 SENSITIVE Sensitive     NITROFURANTOIN <=16 SENSITIVE Sensitive     TRIMETH/SULFA <=20 SENSITIVE Sensitive     AMPICILLIN/SULBACTAM >=32 RESISTANT Resistant     PIP/TAZO <=4 SENSITIVE Sensitive     Extended ESBL NEGATIVE Sensitive     * >=100,000 COLONIES/mL ESCHERICHIA COLI  Blood Culture (routine x 2)     Status: None (Preliminary result)   Collection Time: 06/02/17  9:57 PM  Result Value Ref Range Status   Specimen Description BLOOD LEFT HAND  Final   Special Requests IN PEDIATRIC BOTTLE Blood Culture adequate volume  Final   Culture NO GROWTH 3 DAYS  Final   Report Status PENDING  Incomplete  Blood Culture (routine x 2)     Status: None (Preliminary result)   Collection Time: 06/02/17  10:02 PM  Result Value Ref Range Status   Specimen Description BLOOD LEFT ANTECUBITAL  Final   Special Requests   Final    BOTTLES DRAWN AEROBIC AND ANAEROBIC Blood Culture adequate volume   Culture NO GROWTH 3 DAYS  Final   Report Status PENDING  Incomplete  MRSA PCR Screening     Status: None   Collection Time: 06/03/17  7:31 AM  Result Value Ref Range Status   MRSA by PCR NEGATIVE NEGATIVE Final    Comment:        The GeneXpert MRSA Assay (FDA approved for NASAL specimens only), is one component of a comprehensive MRSA colonization surveillance program. It is not intended to diagnose MRSA infection nor to guide or monitor treatment for MRSA infections.          Radiology Studies: Dg Chest 2 View  Result Date: 06/04/2017 CLINICAL DATA:  Cough.  Status post V/Q scan.  Shortness of breath. EXAM: CHEST  2 VIEW COMPARISON:  06/02/2017 FINDINGS: Cardiac silhouette is mildly enlarged. No mediastinal or hilar masses. No evidence of adenopathy. There are prominent bronchovascular markings mostly in the lung bases, stable. There are surgical vascular clips and pulmonary anastomosis staples extending laterally from the right hilum, also stable. There is no evidence of pneumonia or pulmonary edema. No pleural effusion or pneumothorax. Skeletal structures are demineralized but intact. IMPRESSION: No acute cardiopulmonary disease. Electronically Signed   By: Lajean Manes M.D.   On: 06/04/2017 14:59   Nm Pulmonary Perf And Vent  Result Date: 06/04/2017 CLINICAL DATA:  Shortness of breath. Cor pulmonale. History of lung carcinoma EXAM: NUCLEAR MEDICINE VENTILATION - PERFUSION LUNG SCAN VIEWS: Anterior, posterior, left lateral, right lateral, RPO, LPO, RAO, LAO -ventilation and perfusion RADIOPHARMACEUTICALS:  30.0 mCi Technetium-66mDTPA aerosol inhalation and 4.0 mCi Technetium-910mAA IV COMPARISON:  Prior ventilation perfusion lung scan examination March 13, 2017; chest radiograph  June 02, 2017 FINDINGS: Ventilation: There are a few scattered subsegmental areas of decreased radiotracer uptake, similar to prior lung scan. There is no segmental ventilation defect. Perfusion: There are a few rather minimal foci of decreased radiotracer uptake on the perfusion study. There is note segmental or significant subsegmental perfusion defect. There is no appreciable ventilation/ perfusion mismatch. Cardiomegaly is noted. IMPRESSION: No segmental  or significant subsegmental ventilation or perfusion defects. No appreciable ventilation or perfusion mismatch. There is no appreciable change compared to the lung scan. This study constitutes an overall very low probability of pulmonary embolus. Cardiomegaly is noted. Electronically Signed   By: Lowella Grip III M.D.   On: 06/04/2017 13:52        Scheduled Meds: . aspirin EC  81 mg Oral Daily  . enoxaparin (LOVENOX) injection  75 mg Subcutaneous Q12H  . gabapentin  300 mg Oral TID  . insulin aspart  0-9 Units Subcutaneous TID WC  . insulin aspart  4 Units Subcutaneous TID WC  . insulin detemir  30 Units Subcutaneous QHS  . mouth rinse  15 mL Mouth Rinse BID  . mometasone-formoterol  2 puff Inhalation BID   Continuous Infusions: . levofloxacin (LEVAQUIN) IV Stopped (06/04/17 2229)     LOS: 3 days    Time spent: 25 minutes    Edwin Dada, MD [facility] Pager [phone]  If 7PM-7AM, please contact night-coverage [email or web address] Password [facility] 06/05/2017, 6:17 PM

## 2017-06-05 NOTE — Progress Notes (Signed)
CSW spoke with patient at bedside. Patient stated that she would be willing to discharge back to facility but unfortunately she stated she is in her copay days and is unable to pay $160/per day for rehab. Patient stated he had a house but unfortunately has lost the house and now she is homeless. Patient stated she has support from her son and daughter but she is not able to stay with them. Patient stated she has looked into signing a lease with [facility] but has been unable to do so because she has been in the hospital.   CSW spoke to admission coordinator Abigail Butts at [facility] and she stated they will be able to take patient back and do a payment plan to make it easier on the patient. CSW relayed message to patient and patient stated she is in agreement to discharge to SNF for a week and utilize that time to do short term rehab. Patient stated while she is in rehab she will sign her lease  So she has a stable environment to discharge too.   CSW spoke to MD and relayed information CSW and patient talked about. [name] aware that patient will discharge tomorrow per MD. Facility will be able to take patient over the weekend.   Rhea Pink, MSW,  [place]

## 2017-06-05 NOTE — Plan of Care (Signed)
  Progressing Education: Knowledge of General Education information will improve 06/05/2017 0349 - Progressing by Ruben Im, RN Activity: Risk for activity intolerance will decrease 06/05/2017 0349 - Progressing by Ruben Im, RN Skin Integrity: Risk for impaired skin integrity will decrease 06/05/2017 0349 - Progressing by Ruben Im, RN

## 2017-06-06 ENCOUNTER — Inpatient Hospital Stay (HOSPITAL_COMMUNITY): Payer: Medicare Other

## 2017-06-06 DIAGNOSIS — E119 Type 2 diabetes mellitus without complications: Secondary | ICD-10-CM | POA: Diagnosis not present

## 2017-06-06 DIAGNOSIS — E118 Type 2 diabetes mellitus with unspecified complications: Secondary | ICD-10-CM | POA: Diagnosis not present

## 2017-06-06 DIAGNOSIS — J189 Pneumonia, unspecified organism: Secondary | ICD-10-CM | POA: Diagnosis not present

## 2017-06-06 DIAGNOSIS — G8918 Other acute postprocedural pain: Secondary | ICD-10-CM | POA: Diagnosis not present

## 2017-06-06 DIAGNOSIS — E876 Hypokalemia: Secondary | ICD-10-CM | POA: Diagnosis not present

## 2017-06-06 DIAGNOSIS — Z880 Allergy status to penicillin: Secondary | ICD-10-CM | POA: Diagnosis not present

## 2017-06-06 DIAGNOSIS — I5032 Chronic diastolic (congestive) heart failure: Secondary | ICD-10-CM | POA: Diagnosis not present

## 2017-06-06 DIAGNOSIS — R6520 Severe sepsis without septic shock: Secondary | ICD-10-CM | POA: Diagnosis not present

## 2017-06-06 DIAGNOSIS — I130 Hypertensive heart and chronic kidney disease with heart failure and stage 1 through stage 4 chronic kidney disease, or unspecified chronic kidney disease: Secondary | ICD-10-CM | POA: Diagnosis not present

## 2017-06-06 DIAGNOSIS — F329 Major depressive disorder, single episode, unspecified: Secondary | ICD-10-CM | POA: Diagnosis not present

## 2017-06-06 DIAGNOSIS — Y92129 Unspecified place in nursing home as the place of occurrence of the external cause: Secondary | ICD-10-CM | POA: Diagnosis not present

## 2017-06-06 DIAGNOSIS — R2689 Other abnormalities of gait and mobility: Secondary | ICD-10-CM | POA: Diagnosis not present

## 2017-06-06 DIAGNOSIS — S92512D Displaced fracture of proximal phalanx of left lesser toe(s), subsequent encounter for fracture with routine healing: Secondary | ICD-10-CM | POA: Diagnosis not present

## 2017-06-06 DIAGNOSIS — E7800 Pure hypercholesterolemia, unspecified: Secondary | ICD-10-CM | POA: Diagnosis not present

## 2017-06-06 DIAGNOSIS — Z7982 Long term (current) use of aspirin: Secondary | ICD-10-CM | POA: Diagnosis not present

## 2017-06-06 DIAGNOSIS — J9611 Chronic respiratory failure with hypoxia: Secondary | ICD-10-CM | POA: Diagnosis not present

## 2017-06-06 DIAGNOSIS — E785 Hyperlipidemia, unspecified: Secondary | ICD-10-CM | POA: Diagnosis not present

## 2017-06-06 DIAGNOSIS — M79672 Pain in left foot: Secondary | ICD-10-CM | POA: Diagnosis not present

## 2017-06-06 DIAGNOSIS — Z794 Long term (current) use of insulin: Secondary | ICD-10-CM | POA: Diagnosis not present

## 2017-06-06 DIAGNOSIS — I5033 Acute on chronic diastolic (congestive) heart failure: Secondary | ICD-10-CM | POA: Diagnosis not present

## 2017-06-06 DIAGNOSIS — I2510 Atherosclerotic heart disease of native coronary artery without angina pectoris: Secondary | ICD-10-CM | POA: Diagnosis not present

## 2017-06-06 DIAGNOSIS — C3491 Malignant neoplasm of unspecified part of right bronchus or lung: Secondary | ICD-10-CM | POA: Diagnosis not present

## 2017-06-06 DIAGNOSIS — X501XXA Overexertion from prolonged static or awkward postures, initial encounter: Secondary | ICD-10-CM | POA: Diagnosis not present

## 2017-06-06 DIAGNOSIS — R278 Other lack of coordination: Secondary | ICD-10-CM | POA: Diagnosis not present

## 2017-06-06 DIAGNOSIS — S93325D Dislocation of tarsometatarsal joint of left foot, subsequent encounter: Secondary | ICD-10-CM | POA: Diagnosis not present

## 2017-06-06 DIAGNOSIS — N390 Urinary tract infection, site not specified: Secondary | ICD-10-CM | POA: Diagnosis not present

## 2017-06-06 DIAGNOSIS — Z85118 Personal history of other malignant neoplasm of bronchus and lung: Secondary | ICD-10-CM | POA: Diagnosis not present

## 2017-06-06 DIAGNOSIS — E1122 Type 2 diabetes mellitus with diabetic chronic kidney disease: Secondary | ICD-10-CM | POA: Diagnosis not present

## 2017-06-06 DIAGNOSIS — A419 Sepsis, unspecified organism: Secondary | ICD-10-CM | POA: Diagnosis not present

## 2017-06-06 DIAGNOSIS — Z79899 Other long term (current) drug therapy: Secondary | ICD-10-CM | POA: Diagnosis not present

## 2017-06-06 DIAGNOSIS — J449 Chronic obstructive pulmonary disease, unspecified: Secondary | ICD-10-CM | POA: Diagnosis not present

## 2017-06-06 DIAGNOSIS — I110 Hypertensive heart disease with heart failure: Secondary | ICD-10-CM | POA: Diagnosis not present

## 2017-06-06 DIAGNOSIS — S92902A Unspecified fracture of left foot, initial encounter for closed fracture: Secondary | ICD-10-CM | POA: Diagnosis not present

## 2017-06-06 DIAGNOSIS — Z9981 Dependence on supplemental oxygen: Secondary | ICD-10-CM | POA: Diagnosis not present

## 2017-06-06 DIAGNOSIS — C3431 Malignant neoplasm of lower lobe, right bronchus or lung: Secondary | ICD-10-CM | POA: Diagnosis not present

## 2017-06-06 DIAGNOSIS — Z87440 Personal history of urinary (tract) infections: Secondary | ICD-10-CM | POA: Diagnosis not present

## 2017-06-06 DIAGNOSIS — S92312A Displaced fracture of first metatarsal bone, left foot, initial encounter for closed fracture: Secondary | ICD-10-CM | POA: Diagnosis not present

## 2017-06-06 DIAGNOSIS — N183 Chronic kidney disease, stage 3 (moderate): Secondary | ICD-10-CM | POA: Diagnosis not present

## 2017-06-06 DIAGNOSIS — M6281 Muscle weakness (generalized): Secondary | ICD-10-CM | POA: Diagnosis not present

## 2017-06-06 DIAGNOSIS — S92312D Displaced fracture of first metatarsal bone, left foot, subsequent encounter for fracture with routine healing: Secondary | ICD-10-CM | POA: Diagnosis not present

## 2017-06-06 DIAGNOSIS — Z87891 Personal history of nicotine dependence: Secondary | ICD-10-CM | POA: Diagnosis not present

## 2017-06-06 DIAGNOSIS — S93325A Dislocation of tarsometatarsal joint of left foot, initial encounter: Secondary | ICD-10-CM | POA: Diagnosis not present

## 2017-06-06 LAB — ECHOCARDIOGRAM COMPLETE
AVLVOTPG: 3 mmHg
CHL CUP DOP CALC LVOT VTI: 17.8 cm
CHL CUP TV REG PEAK VELOCITY: 338 cm/s
E/e' ratio: 11.97
FS: 15 % — AB (ref 28–44)
Height: 61 in
IVS/LV PW RATIO, ED: 1
LA vol index: 24.3 mL/m2
LADIAMINDEX: 2.18 cm/m2
LASIZE: 38 mm
LAVOL: 42.2 mL
LAVOLA4C: 43.2 mL
LEFT ATRIUM END SYS DIAM: 38 mm
LV PW d: 12 mm — AB (ref 0.6–1.1)
LV e' LATERAL: 5.85 cm/s
LVEEAVG: 11.97
LVEEMED: 11.97
LVOT SV: 40 mL
LVOT area: 2.27 cm2
LVOT diameter: 17 mm
LVOTPV: 86.7 cm/s
MVPKAVEL: 97.9 m/s
MVPKEVEL: 70 m/s
RV LATERAL S' VELOCITY: 7.35 cm/s
RV sys press: 51 mmHg
TAPSE: 12.2 mm
TDI e' lateral: 5.85
TDI e' medial: 3.98
TRMAXVEL: 338 cm/s
Weight: 2649.6 oz

## 2017-06-06 LAB — BASIC METABOLIC PANEL
ANION GAP: 8 (ref 5–15)
BUN: 27 mg/dL — ABNORMAL HIGH (ref 6–20)
CALCIUM: 9.1 mg/dL (ref 8.9–10.3)
CO2: 28 mmol/L (ref 22–32)
CREATININE: 1.2 mg/dL — AB (ref 0.44–1.00)
Chloride: 99 mmol/L — ABNORMAL LOW (ref 101–111)
GFR calc non Af Amer: 45 mL/min — ABNORMAL LOW (ref >60)
GFR, EST AFRICAN AMERICAN: 52 mL/min — AB (ref >60)
Glucose, Bld: 208 mg/dL — ABNORMAL HIGH (ref 65–99)
Potassium: 4 mmol/L (ref 3.5–5.1)
SODIUM: 135 mmol/L (ref 135–145)

## 2017-06-06 LAB — GLUCOSE, CAPILLARY
GLUCOSE-CAPILLARY: 210 mg/dL — AB (ref 65–99)
Glucose-Capillary: 182 mg/dL — ABNORMAL HIGH (ref 65–99)
Glucose-Capillary: 182 mg/dL — ABNORMAL HIGH (ref 65–99)

## 2017-06-06 LAB — CBC
HCT: 39.9 % (ref 36.0–46.0)
HEMOGLOBIN: 13 g/dL (ref 12.0–15.0)
MCH: 26.3 pg (ref 26.0–34.0)
MCHC: 32.6 g/dL (ref 30.0–36.0)
MCV: 80.6 fL (ref 78.0–100.0)
PLATELETS: 130 10*3/uL — AB (ref 150–400)
RBC: 4.95 MIL/uL (ref 3.87–5.11)
RDW: 18.1 % — ABNORMAL HIGH (ref 11.5–15.5)
WBC: 7.5 10*3/uL (ref 4.0–10.5)

## 2017-06-06 MED ORDER — LEVOFLOXACIN 750 MG PO TABS
750.0000 mg | ORAL_TABLET | Freq: Once | ORAL | Status: AC
  Administered 2017-06-06: 750 mg via ORAL
  Filled 2017-06-06: qty 1

## 2017-06-06 MED ORDER — TRAMADOL HCL 50 MG PO TABS: 50.0000 mg | ORAL_TABLET | Freq: Three times a day (TID) | ORAL | 0 refills | Status: DC | PRN

## 2017-06-06 NOTE — Progress Notes (Signed)
  Echocardiogram 2D Echocardiogram has been performed.  Amy Shepard 06/06/2017, 10:40 AM

## 2017-06-06 NOTE — Progress Notes (Signed)
Report given to [facility]. questions answered IV and tele removed

## 2017-06-06 NOTE — Progress Notes (Signed)
Patient is set to discharge to [facility] SNF today. Patient & daughter, Clementeen Graham, aware. Discharge packet given to RN. [facility] called for transport.   Kingsley Spittle, LCSWA Clinical Social Worker [phone]

## 2017-06-06 NOTE — Discharge Summary (Signed)
Physician Discharge Summary  Telecia Larocque VEL:[id number] DOB: 12-31-1945 DOA: 06/02/2017  PCP: Maurice Small, MD  Admit date: 06/02/2017 Discharge date: 06/06/2017  Admitted From: SNF [facility] Disposition:  SNF [facility]  Recommendations for Outpatient Follow-up:  1. Follow up with PCP in 1-2 weeks 2. Please obtain BMP/CBC in one week   Home Health: No Equipment/Devices: O2  Discharge Condition: Improved, good CODE STATUS: FULL Diet recommendation: Low sodium  Brief/Interim Summary: 71 y.o. F with diastolic CHF last EF 25-85% with grade 1dFx in 02/2017, COPD, oxygen dependent 3L, lung cancer in remission, and DM type II who presented from SNF for falls and altered mental status.  Reported dizziness, weakness, confusion, chills, nausea, and vomiting, without cough, sputum production or dysuria.  In ER, found to have atypical opacities on CXR as well as respiratory alkalosis, and leukocyturia.  Started on Levaquin for suspected sepsis from pneumonia vs UTI.     Sepsis On admission, rales, CXR opacities and respiratory alkalosis suggested pneumonia.  Given 5 days Levaquin with marked improvement in respiratory status.  Urine culture did grow pansusceptible E coli, thought to be asympatic bacteriuria.    Respiratory alkalosis and troponin elevation Started empirically on Lovenox.  VQ scan negative.  Echocardiogram unchanged.  Ultimately likely demand ischemia in setting of pneumonia and underlying heart failure, poor clearance.  Encephalopathy Resolved with treatment fo pneumonia  Foot fracture She is due for pinning of her left foot by Dr. Marcelino Scot on Monday.  She has now completed treatment for pneumonia, may proceed.    Discharge Diagnoses:  Principal Problem:   Sepsis secondary to UTI [facility]) Active Problems:   Lung cancer ([place])   Type 2 diabetes mellitus with complication, with long-term current use of insulin ([name])   Chronic diastolic CHF (congestive heart failure)  ([name])   Chronic respiratory failure with hypoxia ([name])   Falls   Hypokalemia    Discharge Instructions  Discharge Instructions    Call MD for:  difficulty breathing, headache or visual disturbances   Complete by:  As directed    Call MD for:  temperature >100.4   Complete by:  As directed    Diet - low sodium heart healthy   Complete by:  As directed    Discharge instructions   Complete by:  As directed    You were admitted for pneumonia that caused encephalopathy and early sepsis.  This was treated with antibiotics and the course was completed.  You had an echocardiogram (ultrasound of heart) that was normal, and testing that also ruled out pulmonary embolism (blood clots in the lungs).  Call Dr. Marcelino Scot to let him know you were in the hospital for pneumonia, but completed treatment.   Increase activity slowly   Complete by:  As directed    No weight bearing on left leg until cleared by Dr. Marcelino Scot from [facility]     Allergies as of 06/06/2017      Reactions   Amoxicillin Anaphylaxis, Hives, Rash   Has patient had a PCN reaction causing immediate rash, facial/tongue/throat swelling, SOB or lightheadedness with hypotension: YES Positive reaction causing SEVERE RASH INVOLVING MUCUS MEMBRANES/SKIN NECROSIS: YES Reaction that required HOSPITALIZATION: YES Reaction occurring within the last 10 years: NO      Medication List    STOP taking these medications   insulin glargine 100 UNIT/ML injection Commonly known as:  LANTUS     TAKE these medications   acetaminophen 325 MG tablet Commonly known as:  TYLENOL Take 650 mg  by mouth every 6 (six) hours as needed (for pain or fever).   aspirin EC 81 MG tablet Take 81 mg by mouth daily.   Fluticasone-Salmeterol 250-50 MCG/DOSE Aepb Commonly known as:  ADVAIR Inhale 1 puff into the lungs 2 (two) times daily.   furosemide 40 MG tablet Commonly known as:  LASIX Take 1.5 tablets (60 mg total) by mouth 2 (two)  times daily.   gabapentin 300 MG capsule Commonly known as:  NEURONTIN Take 300 mg by mouth 3 (three) times daily.   guaiFENesin 600 MG 12 hr tablet Commonly known as:  MUCINEX Take 1 tablet (600 mg total) by mouth 2 (two) times daily as needed for cough or to loosen phlegm.   insulin aspart 100 UNIT/ML injection Commonly known as:  novoLOG Inject 4 Units into the skin 3 (three) times daily with meals. What changed:  additional instructions   ipratropium-albuterol 0.5-2.5 (3) MG/3ML Soln Commonly known as:  DUONEB Take 3 mLs by nebulization every 4 (four) hours as needed (wheezing, Shortness of breath). What changed:  reasons to take this   LEVEMIR FLEXTOUCH 100 UNIT/ML Pen Generic drug:  Insulin Detemir Inject 30 Units into the skin at bedtime.   OXYGEN Inhale 3 L into the lungs continuous. FOR COPD   REFRESH EYE ITCH RELIEF OP Place 1 drop into both eyes 2 (two) times daily.   rosuvastatin 40 MG tablet Commonly known as:  CRESTOR Take 1 tablet (40 mg total) by mouth daily at 6 PM.   sertraline 100 MG tablet Commonly known as:  ZOLOFT Take 150 mg by mouth daily.   spironolactone 25 MG tablet Commonly known as:  ALDACTONE Take 1 tablet (25 mg total) by mouth daily.   traMADol 50 MG tablet Commonly known as:  ULTRAM Take 50 mg by mouth every 8 (eight) hours as needed (for pain).       Allergies  Allergen Reactions  . Amoxicillin Anaphylaxis, Hives and Rash    Has patient had a PCN reaction causing immediate rash, facial/tongue/throat swelling, SOB or lightheadedness with hypotension: YES Positive reaction causing SEVERE RASH INVOLVING MUCUS MEMBRANES/SKIN NECROSIS: YES Reaction that required HOSPITALIZATION: YES Reaction occurring within the last 10 years: NO    Consultations:  None   Procedures/Studies: Dg Chest 2 View  Result Date: 06/04/2017 CLINICAL DATA:  Cough.  Status post V/Q scan.  Shortness of breath. EXAM: CHEST  2 VIEW COMPARISON:   06/02/2017 FINDINGS: Cardiac silhouette is mildly enlarged. No mediastinal or hilar masses. No evidence of adenopathy. There are prominent bronchovascular markings mostly in the lung bases, stable. There are surgical vascular clips and pulmonary anastomosis staples extending laterally from the right hilum, also stable. There is no evidence of pneumonia or pulmonary edema. No pleural effusion or pneumothorax. Skeletal structures are demineralized but intact. IMPRESSION: No acute cardiopulmonary disease. Electronically Signed   By: Lajean Manes M.D.   On: 06/04/2017 14:59   Dg Chest 2 View  Result Date: 06/02/2017 CLINICAL DATA:  71 year old female with cough x3 days. EXAM: CHEST  2 VIEW COMPARISON:  Chest radiograph dated 05/03/2017 FINDINGS: There is cardiomegaly with mild vascular congestion. Hazy density at the right lung base likely atelectatic changes. Trace right pleural effusion is not excluded. There is no focal consolidation or pneumothorax. There is atherosclerotic calcification of the aortic arch. Osteopenia with degenerative changes of the spine. No acute osseous pathology. IMPRESSION: Cardiomegaly with mild vascular congestion and possible trace right pleural effusion. Electronically Signed   By: Milas Hock  Radparvar M.D.   On: 06/02/2017 20:31   Ct Head Wo Contrast  Result Date: 06/02/2017 CLINICAL DATA:  Unwitnessed fall with  confusion EXAM: CT HEAD WITHOUT CONTRAST TECHNIQUE: Contiguous axial images were obtained from the base of the skull through the vertex without intravenous contrast. COMPARISON:  07/11/2008 FINDINGS: Brain: Generalized atrophic changes are noted. Mild chronic white matter ischemic change is seen. No findings to suggest acute hemorrhage, acute infarction or space-occupying mass lesion are noted. Vascular: No hyperdense vessel or unexpected calcification. Skull: Normal. Negative for fracture or focal lesion. Sinuses/Orbits: Mucosal retention cyst in the sphenoid sinus.  Other: None. IMPRESSION: Chronic changes without acute abnormality. Electronically Signed   By: Inez Catalina M.D.   On: 06/02/2017 20:13   Nm Pulmonary Perf And Vent  Result Date: 06/04/2017 CLINICAL DATA:  Shortness of breath. Cor pulmonale. History of lung carcinoma EXAM: NUCLEAR MEDICINE VENTILATION - PERFUSION LUNG SCAN VIEWS: Anterior, posterior, left lateral, right lateral, RPO, LPO, RAO, LAO -ventilation and perfusion RADIOPHARMACEUTICALS:  30.0 mCi Technetium-41mDTPA aerosol inhalation and 4.0 mCi Technetium-964mAA IV COMPARISON:  Prior ventilation perfusion lung scan examination March 13, 2017; chest radiograph June 02, 2017 FINDINGS: Ventilation: There are a few scattered subsegmental areas of decreased radiotracer uptake, similar to prior lung scan. There is no segmental ventilation defect. Perfusion: There are a few rather minimal foci of decreased radiotracer uptake on the perfusion study. There is note segmental or significant subsegmental perfusion defect. There is no appreciable ventilation/ perfusion mismatch. Cardiomegaly is noted. IMPRESSION: No segmental or significant subsegmental ventilation or perfusion defects. No appreciable ventilation or perfusion mismatch. There is no appreciable change compared to the lung scan. This study constitutes an overall very low probability of pulmonary embolus. Cardiomegaly is noted. Electronically Signed   By: WiLowella GripII M.D.   On: 06/04/2017 13:52       Subjective: Feels back to baseline.  Breathing good.  Exercise tolerance with PT good.  Mentation and orientation good.  No chest pain, dyspena, confusion, weakness.   Discharge Exam: Vitals:   06/06/17 0747 06/06/17 1200  BP:  109/65  Pulse: 67 72  Resp: 18 18  Temp:  (!) 97.5 F (36.4 C)  SpO2: 97% 95%   Vitals:   06/05/17 2041 06/06/17 0442 06/06/17 0747 06/06/17 1200  BP:  112/64  109/65  Pulse:  70 67 72  Resp:  _0 Temp:  98 F (36.7 C)  (!) 97.5 F  (36.4 C)  TempSrc:  Oral  Oral  SpO2: 96% 94% 97% 95%  Weight:  75.1 kg (165 lb 9.6 oz)    Height:        General: Pt is alert, awake, not in acute distress Cardiovascular: RRR, S1/S2 +, soft SEM, no rubs, no gallops Respiratory: CTA bilaterally, no wheezing, no rhonchi Abdominal: Soft, NT, ND, bowel sounds + Extremities: no edema, no cyanosis    The results of significant diagnostics from this hospitalization (including imaging, microbiology, ancillary and laboratory) are listed below for reference.     Microbiology: Recent Results (from the past 240 hour(s))  Culture, Urine     Status: Abnormal   Collection Time: 06/02/17  8:39 PM  Result Value Ref Range Status   Specimen Description URINE, CLEAN CATCH  Final   Special Requests NONE  Final   Culture >=100,000 COLONIES/mL ESCHERICHIA COLI (A)  Final   Report Status 06/05/2017 FINAL  Final   Organism ID, Bacteria ESCHERICHIA COLI (A)  Final  Susceptibility   Escherichia coli - MIC*    AMPICILLIN >=32 RESISTANT Resistant     CEFAZOLIN <=4 SENSITIVE Sensitive     CEFTRIAXONE <=1 SENSITIVE Sensitive     CIPROFLOXACIN <=0.25 SENSITIVE Sensitive     GENTAMICIN <=1 SENSITIVE Sensitive     IMIPENEM <=0.25 SENSITIVE Sensitive     NITROFURANTOIN <=16 SENSITIVE Sensitive     TRIMETH/SULFA <=20 SENSITIVE Sensitive     AMPICILLIN/SULBACTAM >=32 RESISTANT Resistant     PIP/TAZO <=4 SENSITIVE Sensitive     Extended ESBL NEGATIVE Sensitive     * >=100,000 COLONIES/mL ESCHERICHIA COLI  Blood Culture (routine x 2)     Status: None (Preliminary result)   Collection Time: 06/02/17  9:57 PM  Result Value Ref Range Status   Specimen Description BLOOD LEFT HAND  Final   Special Requests IN PEDIATRIC BOTTLE Blood Culture adequate volume  Final   Culture NO GROWTH 3 DAYS  Final   Report Status PENDING  Incomplete  Blood Culture (routine x 2)     Status: None (Preliminary result)   Collection Time: 06/02/17 10:02 PM  Result Value Ref  Range Status   Specimen Description BLOOD LEFT ANTECUBITAL  Final   Special Requests   Final    BOTTLES DRAWN AEROBIC AND ANAEROBIC Blood Culture adequate volume   Culture NO GROWTH 3 DAYS  Final   Report Status PENDING  Incomplete  MRSA PCR Screening     Status: None   Collection Time: 06/03/17  7:31 AM  Result Value Ref Range Status   MRSA by PCR NEGATIVE NEGATIVE Final    Comment:        The GeneXpert MRSA Assay (FDA approved for NASAL specimens only), is one component of a comprehensive MRSA colonization surveillance program. It is not intended to diagnose MRSA infection nor to guide or monitor treatment for MRSA infections.      Labs: BNP (last 3 results) Recent Labs    03/15/17 0519 05/02/17 0756 06/02/17 1927  BNP 444.1* 1,284.3* 938.1*   Basic Metabolic Panel: Recent Labs  Lab 06/02/17 1903 06/03/17 0338 06/04/17 0336 06/06/17 0520  NA 132* 134* 135 135  K 3.3* 2.9* 4.5 4.0  CL 89* 94* 99* 99*  CO2 _0 GLUCOSE 243* 257* 173* 208*  BUN 24* 23* 24* 27*  CREATININE 1.45* 1.34* 1.38* 1.20*  CALCIUM 9.4 9.0 9.1 9.1  MG  --  2.0  --   --    Liver Function Tests: Recent Labs  Lab 06/02/17 1903  AST 20  ALT 14  ALKPHOS 154*  BILITOT 0.8  PROT 7.3  ALBUMIN 3.1*   No results for input(s): LIPASE, AMYLASE in the last 168 hours. Recent Labs  Lab 06/02/17 1935  AMMONIA 25   CBC: Recent Labs  Lab 06/02/17 1903 06/03/17 0338 06/04/17 0336 06/05/17 0416 06/06/17 0520  WBC 15.9* 13.2* 10.2 8.8 7.5  NEUTROABS 14.4*  --   --   --   --   HGB 13.9 13.1 12.9 13.1 13.0  HCT 42.1 40.3 40.7 41.2 39.9  MCV 80.2 79.6 81.9 82.2 80.6  PLT 140* 115* 124* 124* 130*   Cardiac Enzymes: Recent Labs  Lab 06/03/17 2003 06/04/17 0635  TROPONINI 0.05* 0.06*   BNP: Invalid input(s): POCBNP CBG: Recent Labs  Lab 06/05/17 0751 06/05/17 1153 06/05/17 1712 06/06/17 0718 06/06/17 1157  GLUCAP 151* 195* 165* 182* 210*   D-Dimer Recent Labs     06/03/17 1522  DDIMER 1.63*  Hgb A1c No results for input(s): HGBA1C in the last 72 hours. Lipid Profile No results for input(s): CHOL, HDL, LDLCALC, TRIG, CHOLHDL, LDLDIRECT in the last 72 hours. Thyroid function studies No results for input(s): TSH, T4TOTAL, T3FREE, THYROIDAB in the last 72 hours.  Invalid input(s): FREET3 Anemia work up No results for input(s): VITAMINB12, FOLATE, FERRITIN, TIBC, IRON, RETICCTPCT in the last 72 hours. Urinalysis    Component Value Date/Time   COLORURINE YELLOW 06/02/2017 2055   APPEARANCEUR CLOUDY (A) 06/02/2017 2055   LABSPEC 1.018 06/02/2017 2055   PHURINE 5.0 06/02/2017 2055   GLUCOSEU NEGATIVE 06/02/2017 2055   HGBUR SMALL (A) 06/02/2017 2055   BILIRUBINUR NEGATIVE 06/02/2017 2055   KETONESUR NEGATIVE 06/02/2017 2055   PROTEINUR 100 (A) 06/02/2017 2055   UROBILINOGEN 0.2 02/10/2013 1041   NITRITE POSITIVE (A) 06/02/2017 2055   LEUKOCYTESUR MODERATE (A) 06/02/2017 2055   Sepsis Labs Invalid input(s): PROCALCITONIN,  WBC,  LACTICIDVEN Microbiology Recent Results (from the past 240 hour(s))  Culture, Urine     Status: Abnormal   Collection Time: 06/02/17  8:39 PM  Result Value Ref Range Status   Specimen Description URINE, CLEAN CATCH  Final   Special Requests NONE  Final   Culture >=100,000 COLONIES/mL ESCHERICHIA COLI (A)  Final   Report Status 06/05/2017 FINAL  Final   Organism ID, Bacteria ESCHERICHIA COLI (A)  Final      Susceptibility   Escherichia coli - MIC*    AMPICILLIN >=32 RESISTANT Resistant     CEFAZOLIN <=4 SENSITIVE Sensitive     CEFTRIAXONE <=1 SENSITIVE Sensitive     CIPROFLOXACIN <=0.25 SENSITIVE Sensitive     GENTAMICIN <=1 SENSITIVE Sensitive     IMIPENEM <=0.25 SENSITIVE Sensitive     NITROFURANTOIN <=16 SENSITIVE Sensitive     TRIMETH/SULFA <=20 SENSITIVE Sensitive     AMPICILLIN/SULBACTAM >=32 RESISTANT Resistant     PIP/TAZO <=4 SENSITIVE Sensitive     Extended ESBL NEGATIVE Sensitive     *  >=100,000 COLONIES/mL ESCHERICHIA COLI  Blood Culture (routine x 2)     Status: None (Preliminary result)   Collection Time: 06/02/17  9:57 PM  Result Value Ref Range Status   Specimen Description BLOOD LEFT HAND  Final   Special Requests IN PEDIATRIC BOTTLE Blood Culture adequate volume  Final   Culture NO GROWTH 3 DAYS  Final   Report Status PENDING  Incomplete  Blood Culture (routine x 2)     Status: None (Preliminary result)   Collection Time: 06/02/17 10:02 PM  Result Value Ref Range Status   Specimen Description BLOOD LEFT ANTECUBITAL  Final   Special Requests   Final    BOTTLES DRAWN AEROBIC AND ANAEROBIC Blood Culture adequate volume   Culture NO GROWTH 3 DAYS  Final   Report Status PENDING  Incomplete  MRSA PCR Screening     Status: None   Collection Time: 06/03/17  7:31 AM  Result Value Ref Range Status   MRSA by PCR NEGATIVE NEGATIVE Final    Comment:        The GeneXpert MRSA Assay (FDA approved for NASAL specimens only), is one component of a comprehensive MRSA colonization surveillance program. It is not intended to diagnose MRSA infection nor to guide or monitor treatment for MRSA infections.      Time coordinating discharge: Over 30 minutes  SIGNED:   Edwin Dada, MD  [facility] 06/06/2017, 12:52 PM   If 7PM-7AM, please contact night-coverage [email or web address] Password [email or web address]

## 2017-06-07 LAB — CULTURE, BLOOD (ROUTINE X 2)
CULTURE: NO GROWTH
Culture: NO GROWTH
SPECIAL REQUESTS: ADEQUATE
SPECIAL REQUESTS: ADEQUATE

## 2017-06-08 ENCOUNTER — Encounter (INDEPENDENT_AMBULATORY_CARE_PROVIDER_SITE_OTHER): Payer: Self-pay | Admitting: Orthopedic Surgery

## 2017-06-08 ENCOUNTER — Ambulatory Visit (INDEPENDENT_AMBULATORY_CARE_PROVIDER_SITE_OTHER): Payer: No Typology Code available for payment source | Admitting: Orthopedic Surgery

## 2017-06-08 ENCOUNTER — Ambulatory Visit (INDEPENDENT_AMBULATORY_CARE_PROVIDER_SITE_OTHER): Payer: No Typology Code available for payment source

## 2017-06-08 DIAGNOSIS — S93325A Dislocation of tarsometatarsal joint of left foot, initial encounter: Secondary | ICD-10-CM | POA: Diagnosis not present

## 2017-06-08 DIAGNOSIS — M79672 Pain in left foot: Secondary | ICD-10-CM

## 2017-06-08 DIAGNOSIS — S93325D Dislocation of tarsometatarsal joint of left foot, subsequent encounter: Secondary | ICD-10-CM | POA: Insufficient documentation

## 2017-06-08 LAB — GLUCOSE, CAPILLARY: GLUCOSE-CAPILLARY: 163 mg/dL — AB (ref 65–99)

## 2017-06-08 NOTE — Progress Notes (Signed)
Office Visit Note   Patient: Amy Shepard           Date of Birth: Oct 23, 1945           MRN: [id number] Visit Date: 06/08/2017              Requested by: Maurice Small, MD [address] [place], [place] [address] PCP: Maurice Small, MD  Chief Complaint  Patient presents with  . Left Foot - Fracture      HPI: Patient is a 71 year old woman who presents after an acute fall about 1-2 weeks ago.  She initially was at [name] skilled nursing for strengthening.  Patient states that since that time she has been hospitalized from 06/02/17 to 06/06/17 for a urinary tract infection.  She states on the date of injury that she fell sustaining a twisting injury to her left foot.  Patient is a retired [employer] consultant  Patient has a history of lung cancer type 2 diabetes and congestive heart failure.  Assessment & Plan: Visit Diagnoses:  1. Pain in left foot   2. Lisfranc dislocation, left, initial encounter     Plan: We will plan for open reduction internal fixation of the base of the first metatarsal.  We will proceed with surgery as an outpatient she will return to skilled nursing postoperatively.  She is given instructions for strict elevation continue with the supportive splint.  Follow-Up Instructions: Return in about 2 weeks (around 06/22/2017).   Ortho Exam  Patient is alert, oriented, no adenopathy, well-dressed, normal affect, normal respiratory effort. Examination patient has a palpable dorsalis pedis pulse.  There is ecchymosis and bruising over the midfoot but there is no skin breakdown.  Her foot is straight there are no ischemic changes in the toes she does have some bruising down into the second and third toes.  Imaging: Xr Foot Complete Left  Result Date: 06/08/2017 3 view radiographs of the left foot shows a Lisfranc fracture dislocation through the base of the first metatarsal.  Or some cystic changes of the MTP joint.   Patient also has calcification of the digital vessels.  No images are attached to the encounter.  Labs: Lab Results  Component Value Date   HGBA1C 14.4 (H) 05/02/2017   HGBA1C 13.6 (H) 03/11/2017   HGBA1C 12.9 (H) 02/10/2017   ESRSEDRATE 37 (H) 08/13/2016   CRP 0.3 (L) 09/11/2016   REPTSTATUS 06/07/2017 FINAL 06/02/2017   CULT NO GROWTH 5 DAYS 06/02/2017   LABORGA ESCHERICHIA COLI (A) 06/02/2017    _0 (HGBA1)@  _1 @  Orders:  Orders Placed This Encounter  Procedures  . XR Foot Complete Left   No orders of the defined types were placed in this encounter.    Procedures: No procedures performed  Clinical Data: No additional findings.  ROS:  All other systems negative, except as noted in the HPI. Review of Systems  Objective: Vital Signs: There were no vitals taken for this visit.  Specialty Comments:  No specialty comments available.  PMFS History: Patient Active Problem List   Diagnosis Date Noted  . Lisfranc dislocation, left, initial encounter 06/08/2017  . Chronic respiratory failure with hypoxia ([place]) 06/03/2017  . Falls 06/03/2017  . Hypokalemia 06/03/2017  . Sepsis secondary to UTI ([place]) 06/02/2017  . Dog bite 05/02/2017  . CHF exacerbation ([place]) 05/02/2017  . Acute diastolic CHF (congestive heart failure) ([place]) 05/02/2017  . Chronic diastolic CHF (congestive heart failure) ([place]) 04/02/2017  . Cor pulmonale ([place]) 04/02/2017  .  Acute on chronic respiratory failure ([place]) 03/10/2017  . CKD (chronic kidney disease), stage III ([place]) 03/10/2017  . Chronic pain 03/10/2017  . Lactic acidosis 02/10/2017  . Acute respiratory failure with hypoxia ([place]) 02/09/2017  . Acute on chronic diastolic CHF (congestive heart failure) ([place]) 06/23/2016  . HTN (hypertension) 06/23/2016  . Type 2 diabetes mellitus with complication, with long-term current use of insulin ([place]) 03/11/2016  . Depression 03/11/2016  . Chronic obstructive pulmonary disease ([place])  03/10/2016  . S/P lumbar spinal fusion 02/22/2016  . Coronary artery disease involving native coronary artery 07/12/2014  . Pain in the chest   . Malignant neoplasm of lower lobe of right lung ([place]) 05/25/2014  . Lung cancer ([place]) 09/01/2012   Past Medical History:  Diagnosis Date  . Arthritis   . CAD (coronary artery disease)    a. Prior cath 2015 showed 40% prox AD, 50-50% mLAD, otherwise calcification but no obstruction in LCx/RCA.. Medical management recommended. b. 2017: low-risk NST.  [name] Chronic diastolic CHF (congestive heart failure) ([place])   . Chronic respiratory failure ([place])   . CKD (chronic kidney disease), stage III ([place])   . COPD (chronic obstructive pulmonary disease) ([place])   . Cor pulmonale ([name])    a. felt due to advanced COPD and noncompliance with O2.  . Depression   . Diabetes mellitus    [name]    dx  2008  . Hx of seasonal allergies   . Hypercholesteremia   . Hypertension    on no medications  . Lung cancer ([place])   . On home oxygen therapy    "2.5L; 24/7" (03/10/2017)  . Pericardial effusion    a. small-moderate in 07/2016.    Family History  Problem Relation Age of Onset  . Diabetes Father   . Heart failure Father   . Cancer Sister   . Diabetes Brother   . Heart failure Brother     Past Surgical History:  Procedure Laterality Date  . [facility] in [place]     bilateral cataracts  . LEFT HEART CATHETERIZATION WITH CORONARY ANGIOGRAM N/A 07/12/2014   Procedure: LEFT HEART CATHETERIZATION WITH CORONARY ANGIOGRAM;  Surgeon: [name], MD;  Location[facility] CATH LAB;  Service: Cardiovascular;  Laterality: N/A;  . MAXIMUM ACCESS (MAS)POSTERIOR LUMBAR INTERBODY FUSION (PLIF) 2 LEVEL N/A 02/22/2016   Procedure: Lumbar three-four - Lumbar four-five  MAXIMUM ACCESS SURGERY  POSTERIOR LUMBAR INTERBODY FUSION;  Surgeon: [name], MD;  Location: [facility] NEURO ORS;  Service: Neurosurgery;   Laterality: N/A;  . THORACOTOMY Right 2010   lower  . TONSILLECTOMY     Social History   Occupational History  . Not on file  Tobacco Use  . Smoking status: Former Smoker    Packs/day: 1.00    Years: 30.00    Pack years: 30.00    Last attempt to quit: 07/14/2006    Years since quitting: 10.9  . Smokeless tobacco: Never Used  Substance and Sexual Activity  . Alcohol use: No  . Drug use: No  . Sexual activity: Not on file

## 2017-06-09 DIAGNOSIS — S92312D Displaced fracture of first metatarsal bone, left foot, subsequent encounter for fracture with routine healing: Secondary | ICD-10-CM | POA: Diagnosis not present

## 2017-06-09 DIAGNOSIS — I5033 Acute on chronic diastolic (congestive) heart failure: Secondary | ICD-10-CM | POA: Diagnosis not present

## 2017-06-09 DIAGNOSIS — J449 Chronic obstructive pulmonary disease, unspecified: Secondary | ICD-10-CM | POA: Diagnosis not present

## 2017-06-09 DIAGNOSIS — J189 Pneumonia, unspecified organism: Secondary | ICD-10-CM | POA: Diagnosis not present

## 2017-06-11 ENCOUNTER — Other Ambulatory Visit (INDEPENDENT_AMBULATORY_CARE_PROVIDER_SITE_OTHER): Payer: Self-pay | Admitting: Family

## 2017-06-11 ENCOUNTER — Encounter (HOSPITAL_COMMUNITY): Payer: Self-pay | Admitting: *Deleted

## 2017-06-11 ENCOUNTER — Other Ambulatory Visit: Payer: Self-pay

## 2017-06-11 NOTE — Anesthesia Preprocedure Evaluation (Addendum)
Anesthesia Evaluation  Patient identified by MRN, date of birth, ID band Patient awake    Reviewed: Allergy & Precautions, NPO status , Patient's Chart, lab work & pertinent test results  History of Anesthesia Complications Negative for: history of anesthetic complications  Airway Mallampati: II  TM Distance: >3 FB Neck ROM: Full    Dental  (+) Partial Upper, Dental Advisory Given   Pulmonary shortness of breath, with exertion, at rest and Long-Term Oxygen Therapy, COPD,  COPD inhaler and oxygen dependent, former smoker,    Pulmonary exam normal        Cardiovascular hypertension, Pt. on medications + CAD  Normal cardiovascular exam  02/07/16 Nuclear stress test:  Nuclear stress EF: 76%. No wall motion abnormalities  There was no ST segment deviation noted during stress.  Defect 1: There is a small defect of mild severity present in the apex location. No ischemia identified  This is a low risk study.   Neuro/Psych Depression negative neurological ROS     GI/Hepatic negative GI ROS, Neg liver ROS,   Endo/Other  diabetes, Poorly Controlled, Type 2, Oral Hypoglycemic Agents  Renal/GU Renal InsufficiencyRenal diseasenegative Renal ROS     Musculoskeletal   Abdominal   Peds  Hematology negative hematology ROS (+)   Anesthesia Other Findings   Reproductive/Obstetrics                           Lab Results  Component Value Date   WBC 7.5 06/06/2017   HGB 13.0 06/06/2017   HCT 39.9 06/06/2017   MCV 80.6 06/06/2017   PLT 130 (L) 06/06/2017   Lab Results  Component Value Date   CREATININE 1.20 (H) 06/06/2017   BUN 27 (H) 06/06/2017   NA 135 06/06/2017   K 4.0 06/06/2017   CL 99 (L) 06/06/2017   CO2 28 06/06/2017    Anesthesia Physical  Anesthesia Plan  ASA: III  Anesthesia Plan: MAC and Regional   Post-op Pain Management:  Regional for Post-op pain   Induction:  Intravenous  PONV Risk Score and Plan: 2 and Ondansetron, Dexamethasone and Treatment may vary due to age or medical condition  Airway Management Planned: Simple Face Mask and Natural Airway  Additional Equipment:   Intra-op Plan:   Post-operative Plan:   Informed Consent: I have reviewed the patients History and Physical, chart, labs and discussed the procedure including the risks, benefits and alternatives for the proposed anesthesia with the patient or authorized representative who has indicated his/her understanding and acceptance.   Dental advisory given  Plan Discussed with: CRNA, Anesthesiologist and Surgeon  Anesthesia Plan Comments:        Anesthesia Quick Evaluation

## 2017-06-11 NOTE — Progress Notes (Signed)
Anesthesia Chart Review:  Pt is a same day work up  Pt is a resident of [name]  Pt is a 71 year old female scheduled for ORIF base 1st metatarsal fracture on 06/12/2017 with Meridee Score, MD  Providers: - PCP is Maurice Small, MD - Pulmonologist is Marshell Garfinkel, MD - Cardiologist is Ena Dawley, MD  - HF cardiologist is [name], MD. Initial office visit 05/11/17. F/u in 3 weeks and RHC to evaluate for pulmonary hypertension recommended but has not yet happened.    PMH includes:  CAD (nonobstructive 2015), chronic diastolic CHF, HTN, DM, hyperlipidemia, chronic respiratory failure, COPD, lung cancer (s/p RLL segementectomy), CKD (stage III). Uses 3L O2 at all times. Former smoker. BMI 31. S/p PLIF 02/22/16.   - Hospitalized 11/20-24/18 for sepsis, pneumonia, respiratory alkalosis, troponin elevation (thought due to demand ischemia), foot fracture  - Hospitalized 10/20-25/18 for acute on chronic diastolic CHF  - Hospitalized 8/28-03/15/17 for acute on chronic diastolic CHF, acute on chronic respiratory failure with hypoxia  - Hospitalized 7/30-02/13/17 for acute on chronic diastolic CHF  Medications include: ASA 81 mg, Advair, Lasix, NovoLog, Levemir, DuoNeb, rosuvastatin, spironolactone  Labs from hospitalization 06/06/17 reviewed. - Glucose 208. HbA1c was 14.4 on 05/02/17 - Otherwise, BMET and CBC acceptable for surgery  CXR 06/04/17: No acute cardiopulmonary disease.  EKG 06/03/17: NSR. Right superior axis deviation. Inferior infarct, age undetermined  Echo 06/06/17:  - Left ventricle: The cavity size was normal. Wall thickness was increased in a pattern of mild LVH. The estimated ejection fraction was 55%. Left ventricular diastolic function parameters were normal. - Left atrium: The atrium was mildly dilated. - Right ventricle: The cavity size was moderately dilated. - Right atrium: The atrium was mildly dilated. - Atrial septum: A patent foramen ovale cannot  be excluded. - Pulmonary arteries: PA peak pressure: 51 mm Hg (S).  Nuclear stress test 02/07/16:   Nuclear stress EF: 76%. No wall motion abnormalities  There was no ST segment deviation noted during stress.  Defect 1: There is a small defect of mild severity present in the apex location. No ischemia identified  This is a low risk study.  Cardiac cath 07/12/14:  1. Nonobstructive coronary artery disease (proximal LAD 40%, mid 50-60%) 2. Three-vessel coronary calcification 3. Normal left ventricular systolic function  Reviewed case with Dr. Marcie Bal. Pt will need further assessment day of surgery by assigned anesthesiologist.  If no acute CV symptoms, I anticipate pt can proceed as scheduled.   Willeen Cass, FNP-BC [facility]/Anesthesiology Phone: [phone] 06/11/2017 3:17 PM

## 2017-06-11 NOTE — Progress Notes (Signed)
Please complete anesthesia assessment DOS.

## 2017-06-11 NOTE — Pre-Procedure Instructions (Signed)
Katasha Riga  06/11/2017      [id number] [place], [place] - [id number]: [phone]: [phone] [facility] [id number]: [phone]: [phone]   Your procedure is scheduled on Friday, June 12, 2017  [facility]  Call this number if you have problems the morning of surgery:  [phone]   Remember:  Do not eat food or drink liquids after midnight.  Take these medicines the morning of surgery with A SIP OF WATER: If needed: pain medication,  ipratropium-albuterol (DUONEB) for wheezing or shortness of breath, bring in with you the Fluticasone-Salmeterol (ADVAIR) inhaler.  Stop taking vitamins, fish oil and herbal medications. Do not take any NSAIDs ie: Ibuprofen, Advil, Naproxen (Aleve), Motrin, BC and Goody Powder; stop now.    How to Manage Your Diabetes Before and After Surgery  Why is it important to control my blood sugar before and after surgery? . Improving blood sugar levels before and after surgery helps healing and can limit problems. . A way of improving blood sugar control is eating a healthy diet by: o  Eating less sugar and carbohydrates o  Increasing activity/exercise o  Talking with your doctor about reaching your blood sugar goals . High blood sugars (greater than 180 mg/dL) can raise your risk of infections and slow your recovery, so you will need to focus on controlling your diabetes during the weeks before surgery. . Make sure that the doctor who takes care of your diabetes knows about your planned surgery including the date and location.  How do I manage my blood sugar before surgery? . Check your blood sugar at least 4 times a day, starting 2 days before surgery, to make sure that  the level is not too high or low. o Check your blood sugar the morning of your surgery when you wake up and every 2 hours until you get to the Short Stay unit. . If your blood sugar is less than 70 mg/dL, you will need to treat for low blood sugar: o Do not take insulin. o Treat a low blood sugar (less than 70 mg/dL) with  cup of clear juice (cranberry or apple), 4 glucose tablets, OR glucose gel. Recheck blood sugar in 15 minutes after treatment (to make sure it is greater than 70 mg/dL). If your blood sugar is not greater than 70 mg/dL on recheck, call [phone]  for further instructions. . Report your blood sugar to the short stay nurse when you get to Short Stay.  . If you are admitted to the hospital after surgery: o Your blood sugar will be checked by the staff and you will probably be given insulin after surgery (instead of oral diabetes medicines) to make sure you have good blood sugar levels. o The goal for blood sugar control after surgery is 80-180 mg/dL.  WHAT DO I DO ABOUT MY DIABETES MEDICATION?   . THE NIGHT BEFORE SURGERY, take 15 units of  Insulin Detemir (LEVEMIR) .  .Marland KitchenNo Novolog insulin the morning of surgery unless your CBG is greater than 220 mg/dL, then you may take  of your sliding scale (correction) dose of insulin.  Reviewed and Endorsed by [facility] Education Committee, August 2015  Do not wear jewelry, make-up or  nail polish.  Do not wear lotions, powders, or perfumes, or deoderant.  Do not shave 48 hours prior to surgery.  Men may shave face and neck.  Do not bring valuables to the hospital.  [facility] is not responsible for any belongings or valuables.  Contacts, dentures or bridgework may not be worn into surgery.  Leave your suitcase in the car.  After surgery it may be brought to your room.  For patients admitted to the hospital, discharge time will be determined by your treatment team.  Patients discharged the day of surgery will not  be allowed to drive home.    [name]

## 2017-06-11 NOTE — Progress Notes (Signed)
Pt SDW-Pre-op call completed by pt nurse, [place], LPN at [facility]. Nurse denies that pt C/O any acute cardiopulmonary issues. Nurse confirmed receipt of fax and verbalized understanding of all pre-op instructions. Anesthesia asked to review pt history.

## 2017-06-12 ENCOUNTER — Ambulatory Visit (HOSPITAL_COMMUNITY): Payer: Medicare Other | Admitting: Emergency Medicine

## 2017-06-12 ENCOUNTER — Ambulatory Visit (HOSPITAL_COMMUNITY)
Admission: RE | Admit: 2017-06-12 | Discharge: 2017-06-12 | Disposition: A | Discharge status: Skilled Nursing Facility | Payer: Medicare Other | Source: Ambulatory Visit | Attending: Orthopedic Surgery | Admitting: Orthopedic Surgery

## 2017-06-12 ENCOUNTER — Encounter (HOSPITAL_COMMUNITY)
Admission: RE | Disposition: A | Discharge status: Skilled Nursing Facility | Payer: Self-pay | Source: Ambulatory Visit | Attending: Orthopedic Surgery

## 2017-06-12 ENCOUNTER — Encounter (HOSPITAL_COMMUNITY): Payer: Self-pay | Admitting: Certified Registered Nurse Anesthetist

## 2017-06-12 DIAGNOSIS — N183 Chronic kidney disease, stage 3 (moderate): Secondary | ICD-10-CM | POA: Diagnosis not present

## 2017-06-12 DIAGNOSIS — Z87440 Personal history of urinary (tract) infections: Secondary | ICD-10-CM | POA: Diagnosis not present

## 2017-06-12 DIAGNOSIS — Y92129 Unspecified place in nursing home as the place of occurrence of the external cause: Secondary | ICD-10-CM | POA: Insufficient documentation

## 2017-06-12 DIAGNOSIS — Z880 Allergy status to penicillin: Secondary | ICD-10-CM | POA: Insufficient documentation

## 2017-06-12 DIAGNOSIS — F329 Major depressive disorder, single episode, unspecified: Secondary | ICD-10-CM | POA: Diagnosis not present

## 2017-06-12 DIAGNOSIS — Z794 Long term (current) use of insulin: Secondary | ICD-10-CM | POA: Insufficient documentation

## 2017-06-12 DIAGNOSIS — S92312A Displaced fracture of first metatarsal bone, left foot, initial encounter for closed fracture: Secondary | ICD-10-CM | POA: Insufficient documentation

## 2017-06-12 DIAGNOSIS — Z85118 Personal history of other malignant neoplasm of bronchus and lung: Secondary | ICD-10-CM | POA: Insufficient documentation

## 2017-06-12 DIAGNOSIS — I130 Hypertensive heart and chronic kidney disease with heart failure and stage 1 through stage 4 chronic kidney disease, or unspecified chronic kidney disease: Secondary | ICD-10-CM | POA: Insufficient documentation

## 2017-06-12 DIAGNOSIS — G8918 Other acute postprocedural pain: Secondary | ICD-10-CM | POA: Diagnosis not present

## 2017-06-12 DIAGNOSIS — Z9981 Dependence on supplemental oxygen: Secondary | ICD-10-CM | POA: Insufficient documentation

## 2017-06-12 DIAGNOSIS — E7800 Pure hypercholesterolemia, unspecified: Secondary | ICD-10-CM | POA: Diagnosis not present

## 2017-06-12 DIAGNOSIS — I110 Hypertensive heart disease with heart failure: Secondary | ICD-10-CM | POA: Diagnosis not present

## 2017-06-12 DIAGNOSIS — E1122 Type 2 diabetes mellitus with diabetic chronic kidney disease: Secondary | ICD-10-CM | POA: Diagnosis not present

## 2017-06-12 DIAGNOSIS — I5032 Chronic diastolic (congestive) heart failure: Secondary | ICD-10-CM | POA: Diagnosis not present

## 2017-06-12 DIAGNOSIS — X501XXA Overexertion from prolonged static or awkward postures, initial encounter: Secondary | ICD-10-CM | POA: Insufficient documentation

## 2017-06-12 DIAGNOSIS — I2510 Atherosclerotic heart disease of native coronary artery without angina pectoris: Secondary | ICD-10-CM | POA: Diagnosis not present

## 2017-06-12 DIAGNOSIS — S93325A Dislocation of tarsometatarsal joint of left foot, initial encounter: Secondary | ICD-10-CM | POA: Diagnosis not present

## 2017-06-12 DIAGNOSIS — Z87891 Personal history of nicotine dependence: Secondary | ICD-10-CM | POA: Insufficient documentation

## 2017-06-12 DIAGNOSIS — J449 Chronic obstructive pulmonary disease, unspecified: Secondary | ICD-10-CM | POA: Insufficient documentation

## 2017-06-12 DIAGNOSIS — Z7982 Long term (current) use of aspirin: Secondary | ICD-10-CM | POA: Insufficient documentation

## 2017-06-12 DIAGNOSIS — I5033 Acute on chronic diastolic (congestive) heart failure: Secondary | ICD-10-CM | POA: Diagnosis not present

## 2017-06-12 DIAGNOSIS — Z79899 Other long term (current) drug therapy: Secondary | ICD-10-CM | POA: Insufficient documentation

## 2017-06-12 HISTORY — PX: ORIF TOE FRACTURE: SHX5032

## 2017-06-12 HISTORY — DX: Other injury of unspecified body region, initial encounter: T14.8XXA

## 2017-06-12 HISTORY — DX: Urinary tract infection, site not specified: N39.0

## 2017-06-12 LAB — GLUCOSE, CAPILLARY
Glucose-Capillary: 214 mg/dL — ABNORMAL HIGH (ref 65–99)
Glucose-Capillary: 260 mg/dL — ABNORMAL HIGH (ref 65–99)

## 2017-06-12 SURGERY — OPEN REDUCTION INTERNAL FIXATION (ORIF) METATARSAL (TOE) FRACTURE
Anesthesia: Monitor Anesthesia Care | Laterality: Left

## 2017-06-12 MED ORDER — FENTANYL CITRATE (PF) 100 MCG/2ML IJ SOLN
INTRAMUSCULAR | Status: DC | PRN
Start: 2017-06-12 — End: 2017-06-12
  Administered 2017-06-12: 50 ug via INTRAVENOUS
  Administered 2017-06-12 (×2): 25 ug via INTRAVENOUS

## 2017-06-12 MED ORDER — ROCURONIUM BROMIDE 10 MG/ML (PF) SYRINGE
PREFILLED_SYRINGE | INTRAVENOUS | Status: AC
  Filled 2017-06-12: qty 5

## 2017-06-12 MED ORDER — FENTANYL CITRATE (PF) 250 MCG/5ML IJ SOLN
INTRAMUSCULAR | Status: AC
  Filled 2017-06-12: qty 5

## 2017-06-12 MED ORDER — LIDOCAINE 2% (20 MG/ML) 5 ML SYRINGE
INTRAMUSCULAR | Status: AC
  Filled 2017-06-12: qty 5

## 2017-06-12 MED ORDER — 0.9 % SODIUM CHLORIDE (POUR BTL) OPTIME
TOPICAL | Status: DC | PRN
  Administered 2017-06-12: 1000 mL

## 2017-06-12 MED ORDER — PROPOFOL 500 MG/50ML IV EMUL
INTRAVENOUS | Status: DC | PRN
Start: 2017-06-12 — End: 2017-06-12
  Administered 2017-06-12: 50 ug/kg/min via INTRAVENOUS

## 2017-06-12 MED ORDER — CHLORHEXIDINE GLUCONATE 4 % EX LIQD: 60.0000 mL | Freq: Once | CUTANEOUS | Status: DC

## 2017-06-12 MED ORDER — PROMETHAZINE HCL 25 MG/ML IJ SOLN: 6.2500 mg | INTRAMUSCULAR | Status: DC | PRN

## 2017-06-12 MED ORDER — FENTANYL CITRATE (PF) 100 MCG/2ML IJ SOLN: 25.0000 ug | INTRAMUSCULAR | Status: DC | PRN

## 2017-06-12 MED ORDER — INSULIN ASPART 100 UNIT/ML ~~LOC~~ SOLN: 4.0000 [IU] | Freq: Once | SUBCUTANEOUS | Status: DC

## 2017-06-12 MED ORDER — ONDANSETRON HCL 4 MG/2ML IJ SOLN
INTRAMUSCULAR | Status: DC | PRN
  Administered 2017-06-12: 4 mg via INTRAVENOUS

## 2017-06-12 MED ORDER — PHENYLEPHRINE 40 MCG/ML (10ML) SYRINGE FOR IV PUSH (FOR BLOOD PRESSURE SUPPORT)
PREFILLED_SYRINGE | INTRAVENOUS | Status: AC
  Filled 2017-06-12: qty 10

## 2017-06-12 MED ORDER — PHENYLEPHRINE 40 MCG/ML (10ML) SYRINGE FOR IV PUSH (FOR BLOOD PRESSURE SUPPORT)
PREFILLED_SYRINGE | INTRAVENOUS | Status: DC | PRN
  Administered 2017-06-12 (×2): 80 ug via INTRAVENOUS

## 2017-06-12 MED ORDER — MIDAZOLAM HCL 2 MG/2ML IJ SOLN
INTRAMUSCULAR | Status: AC
  Filled 2017-06-12: qty 2

## 2017-06-12 MED ORDER — HYDROCODONE-ACETAMINOPHEN 5-325 MG PO TABS: 1.0000 | ORAL_TABLET | ORAL | 0 refills | Status: DC | PRN

## 2017-06-12 MED ORDER — INSULIN ASPART 100 UNIT/ML ~~LOC~~ SOLN
SUBCUTANEOUS | Status: AC
  Administered 2017-06-12: 4 [IU]
  Filled 2017-06-12: qty 1

## 2017-06-12 MED ORDER — MIDAZOLAM HCL 2 MG/2ML IJ SOLN
INTRAMUSCULAR | Status: DC | PRN
  Administered 2017-06-12 (×2): 1 mg via INTRAVENOUS

## 2017-06-12 MED ORDER — LACTATED RINGERS IV SOLN
INTRAVENOUS | Status: DC | PRN
  Administered 2017-06-12: 07:00:00 via INTRAVENOUS

## 2017-06-12 MED ORDER — DEXAMETHASONE SODIUM PHOSPHATE 10 MG/ML IJ SOLN
INTRAMUSCULAR | Status: AC
  Filled 2017-06-12: qty 1

## 2017-06-12 MED ORDER — ONDANSETRON HCL 4 MG/2ML IJ SOLN
INTRAMUSCULAR | Status: AC
  Filled 2017-06-12: qty 2

## 2017-06-12 MED ORDER — BUPIVACAINE-EPINEPHRINE (PF) 0.5% -1:200000 IJ SOLN
INTRAMUSCULAR | Status: DC | PRN
  Administered 2017-06-12: 25 mL via PERINEURAL
  Administered 2017-06-12: 15 mL via PERINEURAL

## 2017-06-12 MED ORDER — PROPOFOL 10 MG/ML IV BOLUS
INTRAVENOUS | Status: AC
  Filled 2017-06-12: qty 40

## 2017-06-12 MED ORDER — PROPOFOL 1000 MG/100ML IV EMUL
INTRAVENOUS | Status: AC
  Filled 2017-06-12: qty 100

## 2017-06-12 MED ORDER — CLINDAMYCIN PHOSPHATE 900 MG/50ML IV SOLN
900.0000 mg | INTRAVENOUS | Status: AC
  Administered 2017-06-12: 900 mg via INTRAVENOUS
  Filled 2017-06-12: qty 50

## 2017-06-12 SURGICAL SUPPLY — 52 items
BLADE AVERAGE 25MMX9MM (BLADE)
BLADE AVERAGE 25X9 (BLADE) IMPLANT
BLADE MINI RND TIP GREEN BEAV (BLADE) IMPLANT
BNDG CMPR 9X4 STRL LF SNTH (GAUZE/BANDAGES/DRESSINGS) ×1
BNDG COHESIVE 1X5 TAN STRL LF (GAUZE/BANDAGES/DRESSINGS) IMPLANT
BNDG COHESIVE 6X5 TAN STRL LF (GAUZE/BANDAGES/DRESSINGS) ×2 IMPLANT
BNDG ESMARK 4X9 LF (GAUZE/BANDAGES/DRESSINGS) ×3 IMPLANT
BNDG GAUZE ELAST 4 BULKY (GAUZE/BANDAGES/DRESSINGS) ×2 IMPLANT
BNDG GAUZE STRTCH 6 (GAUZE/BANDAGES/DRESSINGS) IMPLANT
COTTON STERILE ROLL (GAUZE/BANDAGES/DRESSINGS) IMPLANT
COVER SURGICAL LIGHT HANDLE (MISCELLANEOUS) ×6 IMPLANT
CUFF TOURNIQUET SINGLE 18IN (TOURNIQUET CUFF) IMPLANT
CUFF TOURNIQUET SINGLE 24IN (TOURNIQUET CUFF) IMPLANT
CUFF TOURNIQUET SINGLE 34IN LL (TOURNIQUET CUFF) IMPLANT
CUFF TOURNIQUET SINGLE 44IN (TOURNIQUET CUFF) IMPLANT
DRAPE OEC MINIVIEW 54X84 (DRAPES) IMPLANT
DRAPE U-SHAPE 47X51 STRL (DRAPES) ×3 IMPLANT
DRSG ADAPTIC 3X8 NADH LF (GAUZE/BANDAGES/DRESSINGS) ×2 IMPLANT
DRSG PAD ABDOMINAL 8X10 ST (GAUZE/BANDAGES/DRESSINGS) ×2 IMPLANT
DURAPREP 26ML APPLICATOR (WOUND CARE) ×3 IMPLANT
ELECT REM PT RETURN 9FT ADLT (ELECTROSURGICAL) ×3
ELECTRODE REM PT RTRN 9FT ADLT (ELECTROSURGICAL) ×1 IMPLANT
GAUZE SPONGE 2X2 8PLY STRL LF (GAUZE/BANDAGES/DRESSINGS) IMPLANT
GAUZE SPONGE 4X4 12PLY STRL (GAUZE/BANDAGES/DRESSINGS) IMPLANT
GLOVE BIOGEL PI IND STRL 9 (GLOVE) ×1 IMPLANT
GLOVE BIOGEL PI INDICATOR 9 (GLOVE) ×2
GLOVE SURG ORTHO 9.0 STRL STRW (GLOVE) ×3 IMPLANT
GOWN STRL REUS W/ TWL XL LVL3 (GOWN DISPOSABLE) ×2 IMPLANT
GOWN STRL REUS W/TWL XL LVL3 (GOWN DISPOSABLE) ×6
GUIDEWIRE NON THREAD 1.6MM (WIRE) ×2 IMPLANT
KIT BASIN OR (CUSTOM PROCEDURE TRAY) ×3 IMPLANT
KIT ROOM TURNOVER OR (KITS) ×3 IMPLANT
MANIFOLD NEPTUNE II (INSTRUMENTS) ×3 IMPLANT
NDL HYPO 25GX1X1/2 BEV (NEEDLE) IMPLANT
NEEDLE HYPO 25GX1X1/2 BEV (NEEDLE) IMPLANT
NS IRRIG 1000ML POUR BTL (IV SOLUTION) ×3 IMPLANT
PACK ORTHO EXTREMITY (CUSTOM PROCEDURE TRAY) ×3 IMPLANT
PAD ARMBOARD 7.5X6 YLW CONV (MISCELLANEOUS) ×6 IMPLANT
PAD CAST 4YDX4 CTTN HI CHSV (CAST SUPPLIES) IMPLANT
PADDING CAST COTTON 4X4 STRL (CAST SUPPLIES)
SCREW COMPR HDLS ST 4.5X40 (Screw) ×2 IMPLANT
SPECIMEN JAR SMALL (MISCELLANEOUS) ×3 IMPLANT
SPONGE GAUZE 2X2 STER 10/PKG (GAUZE/BANDAGES/DRESSINGS)
SUCTION FRAZIER HANDLE 10FR (MISCELLANEOUS)
SUCTION TUBE FRAZIER 10FR DISP (MISCELLANEOUS) IMPLANT
SUT ETHILON 2 0 PSLX (SUTURE) ×6 IMPLANT
SUT VIC AB 2-0 CT1 27 (SUTURE) ×3
SUT VIC AB 2-0 CT1 TAPERPNT 27 (SUTURE) ×1 IMPLANT
SYR CONTROL 10ML LL (SYRINGE) IMPLANT
TUBE CONNECTING 12'X1/4 (SUCTIONS)
TUBE CONNECTING 12X1/4 (SUCTIONS) IMPLANT
WATER STERILE IRR 1000ML POUR (IV SOLUTION) ×3 IMPLANT

## 2017-06-12 NOTE — H&P (Signed)
[facility] is an 71 y.o. female.   Chief Complaint: Left foot pain HPI: Patient is a 71 year old woman who presents after an acute fall about 1-2 weeks ago.  [facility].  Patient states that since that time she has been hospitalized from 06/02/17 to 06/06/17 for a urinary tract infection.  She states on the date of injury that she fell sustaining a twisting injury to her left foot.    Past Medical History:  Diagnosis Date  . Arthritis   . CAD (coronary artery disease)    a. Prior cath 2015 showed 40% prox AD, 50-50% mLAD, otherwise calcification but no obstruction in LCx/RCA.. Medical management recommended. b. 2017: low-risk NST.  [name] Chronic diastolic CHF (congestive heart failure) ([facility])   . Chronic respiratory failure ([facility])   . CKD (chronic kidney disease), stage III (Amy Shepard)   . COPD (chronic obstructive pulmonary disease) (Amy Shepard)   . Cor pulmonale ([facility])    a. felt due to advanced COPD and noncompliance with O2.  . Depression   . Diabetes mellitus    [name]    dx  2008  . Fracture    left foot  . Hx of seasonal allergies   . Hypercholesteremia   . Hypertension    on no medications  . Lung cancer ([facility])   . On home oxygen therapy    "2.5L; 24/7" (03/10/2017)  . Pericardial effusion    a. small-moderate in 07/2016.  [name] UTI (urinary tract infection)     Past Surgical History:  Procedure Laterality Date  . [place] or [place] [facility] in [facility]     bilateral cataracts  . LEFT HEART CATHETERIZATION WITH CORONARY ANGIOGRAM N/A 07/12/2014   Procedure: LEFT HEART CATHETERIZATION WITH CORONARY ANGIOGRAM;  Surgeon: [name], MD;  [facility];  Service: Cardiovascular;  Laterality: N/A;  . MAXIMUM ACCESS (MAS)POSTERIOR LUMBAR INTERBODY FUSION (PLIF) 2 LEVEL N/A 02/22/2016   Procedure: Lumbar three-four - Lumbar four-five  MAXIMUM ACCESS SURGERY  POSTERIOR LUMBAR  INTERBODY FUSION;  Surgeon: [name], MD;  Location: [facility];  Service: Neurosurgery;  Laterality: N/A;  . THORACOTOMY Right 2010   lower  . TONSILLECTOMY      Family History  Problem Relation Age of Onset  . Diabetes Father   . Heart failure Father   . Cancer Sister   . Diabetes Brother   . Heart failure Brother    Social History:  reports that she quit smoking about 10 years ago. She has a 30.00 pack-year smoking history. she has never used smokeless tobacco. She reports that she does not drink alcohol or use drugs.  Allergies:  Allergies  Allergen Reactions  . Amoxicillin Anaphylaxis, Hives, Rash and Other (See Comments)    Has patient had a PCN reaction causing immediate rash, facial/tongue/throat swelling, SOB or lightheadedness with hypotension: YES Positive reaction causing SEVERE RASH INVOLVING MUCUS MEMBRANES/SKIN NECROSIS: YES Reaction that required HOSPITALIZATION: YES Reaction occurring within the last 10 years: NO    Medications Prior to Admission  Medication Sig Dispense Refill  . acetaminophen (TYLENOL) 325 MG tablet Take 650 mg by mouth every 6 (six) hours as needed for moderate pain or fever.     [name] aspirin EC 81 MG tablet Take 81 mg by mouth daily.     . Carboxymethylcellulose Sod PF (REFRESH CELLUVISC) 1 % GEL Place 1 drop into  both eyes 2 (two) times daily.    . Fluticasone-Salmeterol (ADVAIR) 250-50 MCG/DOSE AEPB Inhale 1 puff into the lungs 2 (two) times daily.    . furosemide (LASIX) 40 MG tablet Take 1.5 tablets (60 mg total) by mouth 2 (two) times daily. 30 tablet 2  . gabapentin (NEURONTIN) 300 MG capsule Take 300 mg by mouth 3 (three) times daily.    [name] guaiFENesin (MUCINEX) 600 MG 12 hr tablet Take 1 tablet (600 mg total) by mouth 2 (two) times daily as needed for cough or to loosen phlegm. 20 tablet 0  . insulin aspart (NOVOLOG) 100 UNIT/ML injection Inject 4 Units into the skin 3 (three) times daily with meals. (Patient taking differently:  Inject 4 Units into the skin 3 (three) times daily with meals. Per sliding scale: BGL 101-150 = 2 units; 151-200 = 3 units; 201-250 = 5 units; 251-300 = 7 units; 301-350 = 9 units; >350 = 11 units; CALL MD IF BGL IS >400 OR <60) 10 mL 0  . Insulin Detemir (LEVEMIR FLEXTOUCH) 100 UNIT/ML Pen Inject 30 Units into the skin Amy bedtime.    [name] ipratropium-albuterol (DUONEB) 0.5-2.5 (3) MG/3ML SOLN Take 3 mLs by nebulization every 4 (four) hours as needed (wheezing, Shortness of breath). (Patient taking differently: Take 3 mLs by nebulization every 4 (four) hours as needed (for wheezing or shortness of breath). ) 360 mL 0  . OXYGEN Inhale 3 L into the lungs continuous. FOR COPD    . rosuvastatin (CRESTOR) 40 MG tablet Take 1 tablet (40 mg total) by mouth daily Amy 6 PM. (Patient taking differently: Take 40 mg by mouth every evening. ) 90 tablet 3  . sertraline (ZOLOFT) 100 MG tablet Take 150 mg by mouth daily.     [name] spironolactone (ALDACTONE) 25 MG tablet Take 1 tablet (25 mg total) by mouth daily. 30 tablet 0  . traMADol (ULTRAM) 50 MG tablet Take 1 tablet (50 mg total) by mouth every 8 (eight) hours as needed (for pain). (Patient taking differently: Take 50 mg by mouth every 8 (eight) hours as needed for moderate pain. ) 10 tablet 0    No results found for this or any previous visit (from the past 48 hour(s)). No results found.  Review of Systems  All other systems reviewed and are negative.   Blood pressure 118/60, pulse 77, temperature 97.8 F (36.6 C), temperature source Oral, resp. rate 17, SpO2 96 %. Physical Exam  Patient is alert, oriented, no adenopathy, well-dressed, normal affect, normal respiratory effort. Examination patient has a palpable dorsalis pedis pulse.  There is ecchymosis and bruising over the midfoot but there is no skin breakdown.  Her foot is straight there are no ischemic changes in the toes she does have some bruising down into the second and third  toes.   Assessment/Plan 1. Pain in left foot   2. Lisfranc dislocation, left, initial encounter     Plan: We will plan for open reduction internal fixation of the base of the first metatarsal.  We will proceed with surgery as an outpatient she will return to skilled nursing postoperatively.  She is given instructions for strict elevation continue with the supportive splint.     Newt Minion, MD 06/12/2017, 6:25 AM

## 2017-06-12 NOTE — Transfer of Care (Signed)
Immediate Anesthesia Transfer of Care Note  Patient: [facility]  Procedure(s) Performed: OPEN REDUCTION INTERNAL FIXATION (ORIF) BASE 1ST METATARSAL (TOE) FRACTURE (Left )  Patient Location: PACU  Anesthesia Type:MAC and Regional  Level of Consciousness: awake, alert  and oriented  Airway & Oxygen Therapy: Patient Spontanous Breathing and Patient connected to face mask oxygen  Post-op Assessment: Report given to RN, Post -op Vital signs reviewed and stable and Patient moving all extremities X 4  Post vital signs: Reviewed and stable  Last Vitals:  Vitals:   06/12/17 0549  BP: 118/60  Pulse: 77  Resp: 17  Temp: 36.6 C  SpO2: 96%    Last Pain:  Vitals:   06/12/17 0653  TempSrc:   PainSc: 0-No pain      Patients Stated Pain Goal: 2 (31/74/09 9278)  Complications: No apparent anesthesia complications

## 2017-06-12 NOTE — Progress Notes (Signed)
Report given to ronda hunt rn as caregiver

## 2017-06-12 NOTE — Anesthesia Procedure Notes (Signed)
Procedure Name: MAC Date/Time: 06/12/2017 7:35 AM Performed by: Harden Mo, CRNA Pre-anesthesia Checklist: Patient identified, Emergency Drugs available, Suction available and Patient being monitored Patient Re-evaluated:Patient Re-evaluated prior to induction Oxygen Delivery Method: Simple face mask Preoxygenation: Pre-oxygenation with 100% oxygen Induction Type: IV induction Placement Confirmation: positive ETCO2 and breath sounds checked- equal and bilateral Dental Injury: Teeth and Oropharynx as per pre-operative assessment

## 2017-06-12 NOTE — Op Note (Signed)
06/12/2017  8:03 AM  PATIENT:  A[facility]    PRE-OPERATIVE DIAGNOSIS:  Lisfranc Fracture Base Left 1st Metatarsal  POST-OPERATIVE DIAGNOSIS:  Same  PROCEDURE:  OPEN REDUCTION INTERNAL FIXATION (ORIF) BASE 1ST METATARSAL (TOE) FRACTURE  SURGEON:  Newt Minion, MD  PHYSICIAN ASSISTANT:None ANESTHESIA:   General  PREOPERATIVE INDICATIONS:  Amy [facility] is a  71 y.o. female with a diagnosis of Lisfranc Fracture Base Left 1st Metatarsal who failed conservative measures and elected for surgical management.    The risks benefits and alternatives were discussed with the patient preoperatively including but not limited to the risks of infection, bleeding, nerve injury, cardiopulmonary complications, the need for revision surgery, among others, and the patient was willing to proceed.  OPERATIVE IMPLANTS: Synthes 4.5 cannulated headless screw  OPERATIVE FINDINGS: Soft osteoporotic bone  OPERATIVE PROCEDURE: Patient was brought the operating room after undergoing a popliteal block.  After adequate levels of anesthesia were obtained patient's left lower extremity was prepped using DuraPrep draped into a sterile field a timeout was called and Esmarch was wrapped around the ankle for tourniquet control.  A dorsal incision was made in the first webspace.  Blunt dissection carried down to the EHL the capsule was incised the tendon was retracted medially.  There was significant comminution at the fracture site at the base of the first metatarsal.  This was debrided articular cartilage was removed the joint was reduced and stabilized with a 4.5 headless screw.  C-arm fluoroscopy verified alignment and congruence.  The wound was irrigated with normal saline incision was closed using 2-0 nylon a sterile dressing was applied patient was taken to PACU in stable condition plan for discharge to skilled nursing.  Touchdown weightbearing on the left fracture boot elevation of operative extremity  prescription for Vicodin for pain follow-up in the office in 1 week

## 2017-06-12 NOTE — Progress Notes (Signed)
notifed dr singer of pts bs=[id number] orders obtained and carried out

## 2017-06-12 NOTE — Anesthesia Postprocedure Evaluation (Signed)
Anesthesia Post Note  Patient: Sybel Standish  Procedure(s) Performed: OPEN REDUCTION INTERNAL FIXATION (ORIF) BASE 1ST METATARSAL (TOE) FRACTURE (Left )     Patient location during evaluation: PACU Anesthesia Type: Regional Level of consciousness: awake and alert Pain management: pain level controlled Vital Signs Assessment: post-procedure vital signs reviewed and stable Respiratory status: spontaneous breathing and respiratory function stable Cardiovascular status: stable Postop Assessment: no apparent nausea or vomiting Anesthetic complications: no    Last Vitals:  Vitals:   06/12/17 0845 06/12/17 0848  BP: (!) 85/53 (!) 91/44  Pulse: 77 78  Resp: (!) 21 16  Temp: 36.7 C   SpO2: 95% 98%    Last Pain:  Vitals:   06/12/17 0830  TempSrc:   PainSc: 0-No pain                 Marvion Bastidas DANIEL

## 2017-06-12 NOTE — Progress Notes (Signed)
Pt report called to [place] at [facility]

## 2017-06-12 NOTE — Anesthesia Procedure Notes (Signed)
Anesthesia Regional Block: Popliteal block   Pre-Anesthetic Checklist: ,, timeout performed, Correct Patient, Correct Site, Correct Laterality, Correct Procedure, Correct Position, site marked, Risks and benefits discussed,  Surgical consent,  Pre-op evaluation,  At surgeon's request and post-op pain management  Laterality: Left  Prep: chloraprep       Needles:  Injection technique: Single-shot  Needle Type: Echogenic Stimulator Needle          Additional Needles:   Narrative:  Start time: 06/12/2017 7:01 AM End time: 06/12/2017 7:16 AM Injection made incrementally with aspirations every 5 mL.  Performed by: Personally  Anesthesiologist: Duane Boston, MD  Additional Notes: A functioning IV was confirmed and monitors were applied.  Sterile prep and drape, hand hygiene and sterile gloves were used.  Negative aspiration and test dose prior to incremental administration of local anesthetic. The patient tolerated the procedure well.Ultrasound  guidance: relevant anatomy identified, needle position confirmed, local anesthetic spread visualized around nerve(s), vascular puncture avoided.  Image printed for medical record. Adductor Canal Block also placed.

## 2017-06-12 NOTE — Progress Notes (Signed)
Orthopedic Tech Progress Note Patient Details:  [facility] 12/04/1945 [id number]  Ortho Devices Type of Ortho Device: CAM walker Ortho Device/Splint Location: lle Ortho Device/Splint Interventions: Application   Lesha Jager 06/12/2017, 8:34 AM

## 2017-06-13 ENCOUNTER — Encounter (HOSPITAL_COMMUNITY): Payer: Self-pay | Admitting: Orthopedic Surgery

## 2017-06-15 ENCOUNTER — Ambulatory Visit: Payer: Medicare Other | Admitting: Nurse Practitioner

## 2017-06-15 NOTE — Progress Notes (Deleted)
CARDIOLOGY OFFICE NOTE  Date:  06/15/2017    Amy Shepard Date of Birth: Mar 16, 1946 Medical Record #[id number]  PCP:  Maurice Small, MD  Cardiologist:  Nancy Fetter chief complaint on file.   History of Present Illness: Amy Shepard is a 71 y.o. female who presents today for a follow up visit. Seen for Dr. Meda Shepard.   She has a history of lung cancer, depression, COPD with chronic repsiratory failure with home O2 (with history of noncompliance with this), chronic diastolic CHF with cor pulmonale felt due to COPD and noncompliance with O2, DM, hyperlipidemia, nonobstructive coronary artery disease, HLD, HTN, CKD III, & pericardial effusion. Prior cath 2015 showed 40% prox AD, 50-50% mLAD, otherwise calficiation but no obstruction in LCx/RCA. She had a low risk stress test 2017. She is originally from [place] but moved here several years ago to be closer to her children who relocated here.   She's had tumultuous prior course with dyspnea over the last 2 years with hypoxia and dyspnea, with particular issues with noncompliance with home O2. She had a limited echo 07/2016 showing marked enlargement of the RV with development of moderate to severe pulmonary HTN and small to moderate pericardial effusion. CT angio at that time was negative for PE. She was diuresed including with Zaroxolyn. She previously refused home oxygen at home due to the copay/weight and has frequently come into the office without it, with subsequent O2 sats into the 70s. She had seen oncology and was told everything was stable.   Admitted 02/2017 with recurrent DOE, abdominal distention and hypoxia. She had been drinking multiple Amy Shepard a day. CXR showed with mild pulmonary vascular congestion and pulmonary interstitial edema. She was diuresed. D-dimer  was mildly elevated. Repeat VQ scan shows normal perfusion with heterogeneous ventilation consistent with COPD.  Lower extremity duplex study was negative for DVT. 2D  echo during that admission 03/11/17 showed EF 65-70%, grade 1 Dd, severely dilated RV with reduced RV function, mod-severe RAE, mild-mod TR, PASP 47mHg. Her RV failure has been felt to represent cor pulmonale with chronic RV dilation and decreased function with significant pulmonary hypertension, likely related to advanced COPD and noncompliance with oxygen therapy. Other issues include addition of Crestor for LDL of 165. Last labs from 8-03/2017 showed Cr 1.26 (variable up to nearly 1.6), K 4.2, glucose 150, A1C 13.6, Hgb 15.1. Of note, in 09/2016 she was seen by pulmonology who felt she had [place] probable underlying connective tissue disease although initial serologies were unremarkable - he felt she would likely need to go on pulmonary vasodilators. Sleep study and referral for RHC were planned at that time but never performed; patient cited cost and lack of insurance at that time. She's now on Medicaid. Last LFTs 07/2016 with elevated AP but normal AST/ALT.  Last seen back in August by Amy Shepard - she was here again without her oxygen. Still with chronic DOE. She was referred to CHF clinic to try and get her some resources to help her out.   Comes in today. Here with   Past Medical History:  Diagnosis Date  . Arthritis   . CAD (coronary artery disease)    a. Prior cath 2015 showed 40% prox AD, 50-50% mLAD, otherwise calcification but no obstruction in LCx/RCA.. Medical management recommended. b. 2017: low-risk NST.  .Marland KitchenChronic diastolic CHF (congestive heart failure) ([place]   . Chronic respiratory failure ([place]   . CKD (chronic kidney disease), stage III (  [place])   . COPD (chronic obstructive pulmonary disease) ([place])   . Cor pulmonale ([name])    a. felt due to advanced COPD and noncompliance with O2.  . Depression   . Diabetes mellitus    Amy Shepard    dx  2008  . Fracture    left foot  . Hx of seasonal allergies   . Hypercholesteremia   . Hypertension    on no medications  . Lung cancer  ([place])   . On home oxygen therapy    "2.5L; 24/7" (03/10/2017)  . Pericardial effusion    a. small-moderate in 07/2016.  [name] UTI (urinary tract infection)     Past Surgical History:  Procedure Laterality Date  . [place] or [facility] in [place]     bilateral cataracts  . LEFT HEART CATHETERIZATION WITH CORONARY ANGIOGRAM N/A 07/12/2014   Procedure: LEFT HEART CATHETERIZATION WITH CORONARY ANGIOGRAM;  Surgeon: [name], MD;  Location: [facility] CATH LAB;  Service: Cardiovascular;  Laterality: N/A;  . MAXIMUM ACCESS (MAS)POSTERIOR LUMBAR INTERBODY FUSION (PLIF) 2 LEVEL N/A 02/22/2016   Procedure: Lumbar three-four - Lumbar four-five  MAXIMUM ACCESS SURGERY  POSTERIOR LUMBAR INTERBODY FUSION;  Surgeon: [name], MD;  Location: [facility] NEURO ORS;  Service: Neurosurgery;  Laterality: N/A;  . ORIF TOE FRACTURE Left 06/12/2017   Procedure: OPEN REDUCTION INTERNAL FIXATION (ORIF) BASE 1ST METATARSAL (TOE) FRACTURE;  Surgeon: Newt Minion, MD;  Location: [facility];  Service: Orthopedics;  Laterality: Left;  . THORACOTOMY Right 2010   lower  . TONSILLECTOMY       Medications: No outpatient medications have been marked as taking for the 06/15/17 encounter (Appointment) with Burtis Junes, NP.     Allergies: Allergies  Allergen Reactions  . Amoxicillin Anaphylaxis, Hives, Rash and Other (See Comments)    Has patient had a PCN reaction causing immediate rash, facial/tongue/throat swelling, SOB or lightheadedness with hypotension: YES Positive reaction causing SEVERE RASH INVOLVING MUCUS MEMBRANES/SKIN NECROSIS: YES Reaction that required HOSPITALIZATION: YES Reaction occurring within the last 10 years: NO    Social History: The patient  reports that she quit smoking about 10 years ago. She has a 30.00 pack-year smoking history. she has never used smokeless tobacco. She reports that she does not drink alcohol or use drugs.   Family  History: The patient's family history includes Cancer in her sister; Diabetes in her brother and father; Heart failure in her brother and father.   Review of Systems: Please see the history of present illness.   Otherwise, the review of systems is positive for none.   All other systems are reviewed and negative.   Physical Exam: VS:  There were no vitals taken for this visit. [name]  BMI There is no height or weight on file to calculate BMI.  Wt Readings from Last 3 Encounters:  06/12/17 165 lb (74.8 kg)  06/06/17 165 lb 9.6 oz (75.1 kg)  05/11/17 170 lb 4 oz (77.2 kg)    General: Pleasant. Well developed, well nourished and in no acute distress.   HEENT: Normal.  Neck: Supple, no JVD, carotid bruits, or masses noted.  Cardiac: Regular rate and rhythm. No murmurs, rubs, or gallops. No edema.  Respiratory:  Lungs are clear to auscultation bilaterally with normal work of breathing.  GI: Soft and nontender.  MS: No deformity or atrophy. Gait and ROM intact.  Skin: Warm and dry. Color  is normal.  Neuro:  Strength and sensation are intact and no gross focal deficits noted.  Psych: Alert, appropriate and with normal affect.   LABORATORY DATA:  EKG:  EKG is not ordered today.  Lab Results  Component Value Date   WBC 7.5 06/06/2017   HGB 13.0 06/06/2017   HCT 39.9 06/06/2017   PLT 130 (L) 06/06/2017   GLUCOSE 208 (H) 06/06/2017   CHOL 171 05/14/2017   TRIG 277 (H) 05/14/2017   HDL 35 (L) 05/14/2017   LDLCALC 81 05/14/2017   ALT 14 06/02/2017   AST 20 06/02/2017   NA 135 06/06/2017   K 4.0 06/06/2017   CL 99 (L) 06/06/2017   CREATININE 1.20 (H) 06/06/2017   BUN 27 (H) 06/06/2017   CO2 28 06/06/2017   TSH 4.874 (H) 05/02/2017   INR 1.00 02/09/2017   HGBA1C 14.4 (H) 05/02/2017     BNP (last 3 results) Recent Labs    03/15/17 0519 05/02/17 0756 06/02/17 1927  BNP 444.1* 1,284.3* 337.7*    ProBNP (last 3 results) Recent Labs    07/21/16 1210 07/29/16 1159  09/05/16 1131  PROBNP 2,696* 3,611* 2,929*     Other Studies Reviewed Today:  Echo Study Conclusions 05/2017  - Left ventricle: The cavity size was normal. Wall thickness was   increased in a pattern of mild LVH. The estimated ejection   fraction was 55%. Left ventricular diastolic function parameters   were normal. - Left atrium: The atrium was mildly dilated. - Right ventricle: The cavity size was moderately dilated. - Right atrium: The atrium was mildly dilated. - Atrial septum: A patent foramen ovale cannot be excluded. - Pulmonary arteries: Shepard peak pressure: 51 mm Hg (S).  Assessment/Plan:  1. 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  Restrictive PFTs but lung parenchyma looked relatively normal on 1/18 CTA chest.  Findings by PFTs and CT not consistent with emphysema. V/Q scan negative for chronic or acute PEs. Serologic workup was negative. Echo in 8/18 showed dilated and dysfunctional RV.  - She will need a sleep study.  - She will need RHC to establish definitive diagnosis of PAH and assess right and left-sided filling pressures.  We discussed risks/benefits of RHC today and she agreed to proceed with study.  - Continue to wear home oxygen.  2. Chronic hypoxemic respiratory failure: ?Primarily due to pulmonary hypertension.  3. Chronic diastolic CHF: With prominent RV failure. She is volume overloaded on exam.  - Increase Lasix to 60 mg bid. BMET in 1 week.  4. CKD: Stage 3.  Follow BMET as above.  5. CAD: Nonobstructive in 2015.  No exertional chest pain.  Continue statin and ASA 81.   Followup in 3 wks.       1. Chronic diastolic CHF/cor pulmonale/pulm HTN - suspect RV failure is precipitated by chronic respiratory failure with habitual noncompliance with O2 therapy. She again comes in today without home O2, citing she forgot to charge it but promises to wear it as soon as she gets home. She states her O2 sat usually runs around 90 with it on. Her O2 came up to  87-88% while at rest in the office. She appears euvolemic at present time but I am very concerned about her long-term prognosis. I agree with Macarthur Critchley prior concern for poor insight into her condition. We discussed risk of progressive CHF, decline, death, and syncope if her hypoxia remains untreated. Pulm notes in 08/2016 alluded to w/u for primary PAH. She  has had VQ ruling out chronic PE. I will refer to advanced CHF clinic for formal evaluation of this. I believe she would also benefit from follow-up in their office for patient assistance as she does admit to limited insurance. This is why she has not gotten her sleep study yet. I also offered to call for someone to come pick her up and drive her home today as I advised that she not drive if she is not wearing her oxygen but she declined. 2. HTN - controlled. 3. CKD III with hyperkalemia on 9/62/83 - certainly concerning given that she is on spironolactone. PCP stopped potassium supplement. I have asked her not to take any further spironolactone until she hears from [place]. Repeat stat BMET. I did tell her if the level remained markedly high that we sometimes have to have patients go to the hospital to receive medication to help lower the level. EKG appears stable compared to prior. 4. Hyperlipidemia - she was unaware of recent cardiology recommendation to start Crestor. Will start and order f/u liver/lipids in 6 weeks.      Current medicines are reviewed with the patient today.  The patient does not have concerns regarding medicines other than what has been noted above.  The following changes have been made:  See above.  Labs/ tests ordered today include:   No orders of the defined types were placed in this encounter.    Disposition:   FU with *** in {gen number [id number]} {Days to years:10300}.   Patient is agreeable to this plan and will call if any problems develop in the interim.   SignedTruitt Merle, NP  06/15/2017 10:47  AM  [facility] [address] [place], [place]  [address] Phone: [phone] Fax: [phone]

## 2017-06-16 DIAGNOSIS — S92312D Displaced fracture of first metatarsal bone, left foot, subsequent encounter for fracture with routine healing: Secondary | ICD-10-CM | POA: Diagnosis not present

## 2017-06-16 DIAGNOSIS — J449 Chronic obstructive pulmonary disease, unspecified: Secondary | ICD-10-CM | POA: Diagnosis not present

## 2017-06-16 DIAGNOSIS — J189 Pneumonia, unspecified organism: Secondary | ICD-10-CM | POA: Diagnosis not present

## 2017-06-16 DIAGNOSIS — I5033 Acute on chronic diastolic (congestive) heart failure: Secondary | ICD-10-CM | POA: Diagnosis not present

## 2017-06-17 DIAGNOSIS — J449 Chronic obstructive pulmonary disease, unspecified: Secondary | ICD-10-CM | POA: Diagnosis not present

## 2017-06-17 DIAGNOSIS — N390 Urinary tract infection, site not specified: Secondary | ICD-10-CM | POA: Diagnosis not present

## 2017-06-17 DIAGNOSIS — J189 Pneumonia, unspecified organism: Secondary | ICD-10-CM | POA: Diagnosis not present

## 2017-06-17 DIAGNOSIS — S92902A Unspecified fracture of left foot, initial encounter for closed fracture: Secondary | ICD-10-CM | POA: Diagnosis not present

## 2017-06-17 DIAGNOSIS — E119 Type 2 diabetes mellitus without complications: Secondary | ICD-10-CM | POA: Diagnosis not present

## 2017-06-17 DIAGNOSIS — Z9981 Dependence on supplemental oxygen: Secondary | ICD-10-CM | POA: Diagnosis not present

## 2017-06-19 ENCOUNTER — Encounter (INDEPENDENT_AMBULATORY_CARE_PROVIDER_SITE_OTHER): Payer: Self-pay | Admitting: Family

## 2017-06-19 ENCOUNTER — Ambulatory Visit (INDEPENDENT_AMBULATORY_CARE_PROVIDER_SITE_OTHER): Payer: Medicare Other | Admitting: Family

## 2017-06-19 DIAGNOSIS — S93325D Dislocation of tarsometatarsal joint of left foot, subsequent encounter: Secondary | ICD-10-CM

## 2017-06-19 NOTE — Progress Notes (Signed)
Post-Op Visit Note   Patient: Amy Shepard           Date of Birth: Dec 04, 1945           MRN: [id number] Visit Date: 06/19/2017 PCP: Maurice Small, MD  Chief Complaint:  Chief Complaint  Patient presents with  . Left Foot - Routine Post Op    ORIF base of 1st MT Left Foot 7 days post op    HPI:  HPI Patient is a 71 year old woman seen 1 week status post left foot ORIF base of first MT fracture with lisfranc repair. Residing at [facility]. nonweight bearing.  Ortho Exam Incision well approximated with sutures. No drainage, erythema odor or sign of infection.  Visit Diagnoses: No diagnosis found.  Plan: Begin daily dial soap cleansing. Apply dry dressings and remain nonweightbearing. Continue CAM boot on left.   Follow-Up Instructions: Return in about 1 week (around 06/26/2017).   Imaging: No results found.  Orders:  No orders of the defined types were placed in this encounter.  No orders of the defined types were placed in this encounter.    PMFS History: Patient Active Problem List   Diagnosis Date Noted  . Lisfranc dislocation, left, subsequent encounter 06/08/2017  . Chronic respiratory failure with hypoxia ([place]) 06/03/2017  . Falls 06/03/2017  . Hypokalemia 06/03/2017  . Sepsis secondary to UTI ([place]) 06/02/2017  . Dog bite 05/02/2017  . CHF exacerbation ([place]) 05/02/2017  . Acute diastolic CHF (congestive heart failure) ([place]) 05/02/2017  . Chronic diastolic CHF (congestive heart failure) ([place]) 04/02/2017  . Cor pulmonale ([place]) 04/02/2017  . Acute on chronic respiratory failure ([place]) 03/10/2017  . CKD (chronic kidney disease), stage III ([place]) 03/10/2017  . Chronic pain 03/10/2017  . Lactic acidosis 02/10/2017  . Acute respiratory failure with hypoxia ([place]) 02/09/2017  . Acute on chronic diastolic CHF (congestive heart failure) ([place]) 06/23/2016  . HTN (hypertension) 06/23/2016  . Type 2 diabetes mellitus with complication, with long-term current use of  insulin ([place]) 03/11/2016  . Depression 03/11/2016  . Chronic obstructive pulmonary disease ([place]) 03/10/2016  . S/P lumbar spinal fusion 02/22/2016  . Coronary artery disease involving native coronary artery 07/12/2014  . Pain in the chest   . Malignant neoplasm of lower lobe of right lung ([place]) 05/25/2014  . Lung cancer ([place]) 09/01/2012   Past Medical History:  Diagnosis Date  . Arthritis   . CAD (coronary artery disease)    a. Prior cath 2015 showed 40% prox AD, 50-50% mLAD, otherwise calcification but no obstruction in LCx/RCA.. Medical management recommended. b. 2017: low-risk NST.  [name] Chronic diastolic CHF (congestive heart failure) ([place])   . Chronic respiratory failure ([place])   . CKD (chronic kidney disease), stage III ([place])   . COPD (chronic obstructive pulmonary disease) ([place])   . Cor pulmonale ([name])    a. felt due to advanced COPD and noncompliance with O2.  . Depression   . Diabetes mellitus    [name]    dx  2008  . Fracture    left foot  . Hx of seasonal allergies   . Hypercholesteremia   . Hypertension    on no medications  . Lung cancer ([place])   . On home oxygen therapy    "2.5L; 24/7" (03/10/2017)  . Pericardial effusion    a. small-moderate in 07/2016.  [name] UTI (urinary tract infection)     Family History  Problem Relation Age of Onset  . Diabetes Father   .  Heart failure Father   . Cancer Sister   . Diabetes Brother   . Heart failure Brother     Past Surgical History:  Procedure Laterality Date  . [place] or [facility] in [place]     bilateral cataracts  . LEFT HEART CATHETERIZATION WITH CORONARY ANGIOGRAM N/A 07/12/2014   Procedure: LEFT HEART CATHETERIZATION WITH CORONARY ANGIOGRAM;  Surgeon: [name], MD;  Location: [facility] CATH LAB;  Service: Cardiovascular;  Laterality: N/A;  . MAXIMUM ACCESS (MAS)POSTERIOR LUMBAR INTERBODY FUSION (PLIF) 2 LEVEL N/A 02/22/2016   Procedure: Lumbar three-four -  Lumbar four-five  MAXIMUM ACCESS SURGERY  POSTERIOR LUMBAR INTERBODY FUSION;  Surgeon: [name], MD;  Location: [facility] NEURO ORS;  Service: Neurosurgery;  Laterality: N/A;  . ORIF TOE FRACTURE Left 06/12/2017   Procedure: OPEN REDUCTION INTERNAL FIXATION (ORIF) BASE 1ST METATARSAL (TOE) FRACTURE;  Surgeon: Newt Minion, MD;  Location: [facility];  Service: Orthopedics;  Laterality: Left;  . THORACOTOMY Right 2010   lower  . TONSILLECTOMY     Social History   Occupational History  . Not on file  Tobacco Use  . Smoking status: Former Smoker    Packs/day: 1.00    Years: 30.00    Pack years: 30.00    Last attempt to quit: 07/14/2006    Years since quitting: 10.9  . Smokeless tobacco: Never Used  Substance and Sexual Activity  . Alcohol use: No  . Drug use: No  . Sexual activity: Not on file

## 2017-06-25 ENCOUNTER — Ambulatory Visit (INDEPENDENT_AMBULATORY_CARE_PROVIDER_SITE_OTHER): Payer: Medicare Other | Admitting: Family

## 2017-06-25 ENCOUNTER — Telehealth (INDEPENDENT_AMBULATORY_CARE_PROVIDER_SITE_OTHER): Payer: Self-pay | Admitting: Orthopedic Surgery

## 2017-06-25 DIAGNOSIS — C3491 Malignant neoplasm of unspecified part of right bronchus or lung: Secondary | ICD-10-CM | POA: Diagnosis not present

## 2017-06-25 DIAGNOSIS — I5033 Acute on chronic diastolic (congestive) heart failure: Secondary | ICD-10-CM | POA: Diagnosis not present

## 2017-06-25 DIAGNOSIS — J189 Pneumonia, unspecified organism: Secondary | ICD-10-CM | POA: Diagnosis not present

## 2017-06-25 DIAGNOSIS — S92512D Displaced fracture of proximal phalanx of left lesser toe(s), subsequent encounter for fracture with routine healing: Secondary | ICD-10-CM | POA: Diagnosis not present

## 2017-06-25 NOTE — Telephone Encounter (Signed)
Patient called needing to know if she needs to continue being non-weight bearing. Her appt isnt until the 31st so she would like to know that before then. CB # [phone]

## 2017-06-26 ENCOUNTER — Telehealth (INDEPENDENT_AMBULATORY_CARE_PROVIDER_SITE_OTHER): Payer: Self-pay | Admitting: Orthopedic Surgery

## 2017-06-26 NOTE — Telephone Encounter (Signed)
Patient returned call I advised her Amy Shepard tried to call her back. I read her message from [place] to patient.

## 2017-06-26 NOTE — Telephone Encounter (Signed)
Tried calling there is no answer and no way to leave voicemail will try calling again later. No weightbearing until next follow up appointment where she can reevaluated with new xrays.

## 2017-07-02 DIAGNOSIS — I5033 Acute on chronic diastolic (congestive) heart failure: Secondary | ICD-10-CM | POA: Diagnosis not present

## 2017-07-02 DIAGNOSIS — S92312D Displaced fracture of first metatarsal bone, left foot, subsequent encounter for fracture with routine healing: Secondary | ICD-10-CM | POA: Diagnosis not present

## 2017-07-02 DIAGNOSIS — J9611 Chronic respiratory failure with hypoxia: Secondary | ICD-10-CM | POA: Diagnosis not present

## 2017-07-02 DIAGNOSIS — E118 Type 2 diabetes mellitus with unspecified complications: Secondary | ICD-10-CM | POA: Diagnosis not present

## 2017-07-13 ENCOUNTER — Encounter (INDEPENDENT_AMBULATORY_CARE_PROVIDER_SITE_OTHER): Payer: Self-pay | Admitting: Family

## 2017-07-13 ENCOUNTER — Ambulatory Visit (INDEPENDENT_AMBULATORY_CARE_PROVIDER_SITE_OTHER): Payer: No Typology Code available for payment source

## 2017-07-13 ENCOUNTER — Ambulatory Visit (INDEPENDENT_AMBULATORY_CARE_PROVIDER_SITE_OTHER): Payer: Medicare Other | Admitting: Orthopedic Surgery

## 2017-07-13 DIAGNOSIS — S93325D Dislocation of tarsometatarsal joint of left foot, subsequent encounter: Secondary | ICD-10-CM

## 2017-07-13 NOTE — Progress Notes (Signed)
Post-Op Visit Note   Patient: Amy Shepard           Date of Birth: [birth year]           MRN: [id number] Visit Date: 07/13/2017 PCP: Maurice Small, MD  Chief Complaint:  Chief Complaint  Patient presents with  . Left Foot - Routine Post Op    HPI:  HPI Patient is a 71 year old woman seen 4 weeks status post left foot ORIF base of first MT fracture with lisfranc repair. Residing at [facility]. nonweight bearing.   Ortho Exam Incision well healed. No drainage, erythema odor or sign of infection.  Mild swelling.  Visit Diagnoses:  1. Lisfranc dislocation, left, subsequent encounter     Plan: Begin weight bearing as tolerated. Continue CAM boot on left.   Follow-Up Instructions: No Follow-up on file.   Imaging: No results found.  Orders:  Orders Placed This Encounter  Procedures  . XR Foot Complete Left   No orders of the defined types were placed in this encounter.    PMFS History: Patient Active Problem List   Diagnosis Date Noted  . Lisfranc dislocation, left, subsequent encounter 06/08/2017  . Chronic respiratory failure with hypoxia ([place]) 06/03/2017  . Falls 06/03/2017  . Hypokalemia 06/03/2017  . Sepsis secondary to UTI ([place]) 06/02/2017  . Dog bite 05/02/2017  . CHF exacerbation ([place]) 05/02/2017  . Acute diastolic CHF (congestive heart failure) ([place]) 05/02/2017  . Chronic diastolic CHF (congestive heart failure) ([place]) 04/02/2017  . Cor pulmonale ([place]) 04/02/2017  . Acute on chronic respiratory failure ([place]) 03/10/2017  . CKD (chronic kidney disease), stage III ([place]) 03/10/2017  . Chronic pain 03/10/2017  . Lactic acidosis 02/10/2017  . Acute respiratory failure with hypoxia ([place]) 02/09/2017  . Acute on chronic diastolic CHF (congestive heart failure) ([place]) 06/23/2016  . HTN (hypertension) 06/23/2016  . Type 2 diabetes mellitus with complication, with long-term current use of insulin ([place]) 03/11/2016  . Depression 03/11/2016  . Chronic obstructive  pulmonary disease ([place]) 03/10/2016  . S/P lumbar spinal fusion 02/22/2016  . Coronary artery disease involving native coronary artery 07/12/2014  . Pain in the chest   . Malignant neoplasm of lower lobe of right lung ([place]) 05/25/2014  . Lung cancer ([place]) 09/01/2012   Past Medical History:  Diagnosis Date  . Arthritis   . CAD (coronary artery disease)    a. Prior cath 2015 showed 40% prox AD, 50-50% mLAD, otherwise calcification but no obstruction in LCx/RCA.. Medical management recommended. b. 2017: low-risk NST.  [name] Chronic diastolic CHF (congestive heart failure) ([place])   . Chronic respiratory failure ([place])   . CKD (chronic kidney disease), stage III ([place])   . COPD (chronic obstructive pulmonary disease) ([place])   . Cor pulmonale ([name])    a. felt due to advanced COPD and noncompliance with O2.  . Depression   . Diabetes mellitus    [name]    dx  2008  . Fracture    left foot  . Hx of seasonal allergies   . Hypercholesteremia   . Hypertension    on no medications  . Lung cancer ([place])   . On home oxygen therapy    "2.5L; 24/7" (03/10/2017)  . Pericardial effusion    a. small-moderate in 07/2016.  [name] UTI (urinary tract infection)     Family History  Problem Relation Age of Onset  . Diabetes Father   . Heart failure Father   . Cancer Sister   . Diabetes  Brother   . Heart failure Brother     Past Surgical History:  Procedure Laterality Date  . [place] or [facility] in [place]     bilateral cataracts  . LEFT HEART CATHETERIZATION WITH CORONARY ANGIOGRAM N/A 07/12/2014   Procedure: LEFT HEART CATHETERIZATION WITH CORONARY ANGIOGRAM;  Surgeon: [name], MD;  [facility] C[facility] LAB;  Service: Cardiovascular;  Laterality: N/A;  . MAXIMUM ACCESS (MAS)POSTERIOR LUMBAR INTERBODY FUSION (PLIF) 2 LEVEL N/A 02/22/2016   Procedure: Lumbar three-four - Lumbar four-five  MAXIMUM ACCESS SURGERY  POSTERIOR LUMBAR INTERBODY FUSION;   Surgeon: [name], MD;  Location: [facility] NEURO ORS;  Service: Neurosurgery;  Laterality: N/A;  . ORIF TOE FRACTURE Left 06/12/2017   Procedure: OPEN REDUCTION INTERNAL FIXATION (ORIF) BASE 1ST METATARSAL (TOE) FRACTURE;  Surgeon: Newt Minion, MD;  Location: [facility];  Service: Orthopedics;  Laterality: Left;  . THORACOTOMY Right 2010   lower  . TONSILLECTOMY     Social History   Occupational History  . Not on file  Tobacco Use  . Smoking status: Former Smoker    Packs/day: 1.00    Years: 30.00    Pack years: 30.00    Last attempt to quit: 07/14/2006    Years since quitting: 11.0  . Smokeless tobacco: Never Used  Substance and Sexual Activity  . Alcohol use: No  . Drug use: No  . Sexual activity: Not on file

## 2017-07-15 DIAGNOSIS — E118 Type 2 diabetes mellitus with unspecified complications: Secondary | ICD-10-CM | POA: Diagnosis not present

## 2017-07-15 DIAGNOSIS — J449 Chronic obstructive pulmonary disease, unspecified: Secondary | ICD-10-CM | POA: Diagnosis not present

## 2017-07-15 DIAGNOSIS — S92312D Displaced fracture of first metatarsal bone, left foot, subsequent encounter for fracture with routine healing: Secondary | ICD-10-CM | POA: Diagnosis not present

## 2017-07-15 DIAGNOSIS — I5033 Acute on chronic diastolic (congestive) heart failure: Secondary | ICD-10-CM | POA: Diagnosis not present

## 2017-07-20 ENCOUNTER — Encounter (HOSPITAL_COMMUNITY): Payer: Self-pay

## 2017-07-20 ENCOUNTER — Other Ambulatory Visit: Payer: Self-pay | Admitting: Internal Medicine

## 2017-07-20 ENCOUNTER — Inpatient Hospital Stay: Payer: Medicare Other | Attending: Internal Medicine

## 2017-07-20 ENCOUNTER — Ambulatory Visit (HOSPITAL_COMMUNITY)
Admission: RE | Admit: 2017-07-20 | Discharge: 2017-07-20 | Disposition: A | Discharge status: Home / Self Care | Payer: Medicare Other | Source: Ambulatory Visit | Attending: Internal Medicine | Admitting: Internal Medicine

## 2017-07-20 DIAGNOSIS — I700 Atherosclerosis of aorta: Secondary | ICD-10-CM | POA: Insufficient documentation

## 2017-07-20 DIAGNOSIS — Z87891 Personal history of nicotine dependence: Secondary | ICD-10-CM | POA: Diagnosis not present

## 2017-07-20 DIAGNOSIS — E1122 Type 2 diabetes mellitus with diabetic chronic kidney disease: Secondary | ICD-10-CM | POA: Diagnosis not present

## 2017-07-20 DIAGNOSIS — N183 Chronic kidney disease, stage 3 (moderate): Secondary | ICD-10-CM | POA: Insufficient documentation

## 2017-07-20 DIAGNOSIS — I130 Hypertensive heart and chronic kidney disease with heart failure and stage 1 through stage 4 chronic kidney disease, or unspecified chronic kidney disease: Secondary | ICD-10-CM | POA: Diagnosis not present

## 2017-07-20 DIAGNOSIS — Z794 Long term (current) use of insulin: Secondary | ICD-10-CM | POA: Insufficient documentation

## 2017-07-20 DIAGNOSIS — Z902 Acquired absence of lung [part of]: Secondary | ICD-10-CM | POA: Insufficient documentation

## 2017-07-20 DIAGNOSIS — I2510 Atherosclerotic heart disease of native coronary artery without angina pectoris: Secondary | ICD-10-CM | POA: Insufficient documentation

## 2017-07-20 DIAGNOSIS — F329 Major depressive disorder, single episode, unspecified: Secondary | ICD-10-CM | POA: Diagnosis not present

## 2017-07-20 DIAGNOSIS — Z7982 Long term (current) use of aspirin: Secondary | ICD-10-CM | POA: Diagnosis not present

## 2017-07-20 DIAGNOSIS — Z9221 Personal history of antineoplastic chemotherapy: Secondary | ICD-10-CM | POA: Diagnosis not present

## 2017-07-20 DIAGNOSIS — I5033 Acute on chronic diastolic (congestive) heart failure: Secondary | ICD-10-CM

## 2017-07-20 DIAGNOSIS — I5032 Chronic diastolic (congestive) heart failure: Secondary | ICD-10-CM | POA: Insufficient documentation

## 2017-07-20 DIAGNOSIS — J449 Chronic obstructive pulmonary disease, unspecified: Secondary | ICD-10-CM | POA: Diagnosis not present

## 2017-07-20 DIAGNOSIS — C3431 Malignant neoplasm of lower lobe, right bronchus or lung: Secondary | ICD-10-CM

## 2017-07-20 DIAGNOSIS — E7800 Pure hypercholesterolemia, unspecified: Secondary | ICD-10-CM | POA: Diagnosis not present

## 2017-07-20 DIAGNOSIS — Z85118 Personal history of other malignant neoplasm of bronchus and lung: Secondary | ICD-10-CM | POA: Diagnosis present

## 2017-07-20 DIAGNOSIS — Z9981 Dependence on supplemental oxygen: Secondary | ICD-10-CM | POA: Diagnosis not present

## 2017-07-20 DIAGNOSIS — Z79899 Other long term (current) drug therapy: Secondary | ICD-10-CM | POA: Diagnosis not present

## 2017-07-20 DIAGNOSIS — R918 Other nonspecific abnormal finding of lung field: Secondary | ICD-10-CM | POA: Diagnosis not present

## 2017-07-20 LAB — CBC WITH DIFFERENTIAL/PLATELET
Abs Granulocyte: 6.9 10*3/uL — ABNORMAL HIGH (ref 1.5–6.5)
Basophils Absolute: 0 10*3/uL (ref 0.0–0.1)
Basophils Relative: 1 %
EOS ABS: 0.2 10*3/uL (ref 0.0–0.5)
EOS PCT: 2 %
HCT: 36.1 % (ref 34.8–46.6)
Hemoglobin: 12.2 g/dL (ref 11.6–15.9)
LYMPHS ABS: 0.9 10*3/uL (ref 0.9–3.3)
LYMPHS PCT: 10 %
MCH: 27.6 pg (ref 25.1–34.0)
MCHC: 33.7 g/dL (ref 31.5–36.0)
MCV: 82 fL (ref 79.5–101.0)
MONOS PCT: 9 %
Monocytes Absolute: 0.7 10*3/uL (ref 0.1–0.9)
Neutro Abs: 6.9 10*3/uL — ABNORMAL HIGH (ref 1.5–6.5)
Neutrophils Relative %: 78 %
Platelets: 169 10*3/uL (ref 145–400)
RBC: 4.4 MIL/uL (ref 3.70–5.45)
RDW: 18.5 % — ABNORMAL HIGH (ref 11.2–16.1)
WBC: 8.7 10*3/uL (ref 3.9–10.3)

## 2017-07-20 LAB — COMPREHENSIVE METABOLIC PANEL
ALBUMIN: 3.6 g/dL (ref 3.5–5.0)
ALK PHOS: 149 U/L (ref 40–150)
ALT: 18 U/L (ref 0–55)
AST: 21 U/L (ref 5–34)
Anion gap: 12 — ABNORMAL HIGH (ref 3–11)
BUN: 38 mg/dL — ABNORMAL HIGH (ref 7–26)
CALCIUM: 10.5 mg/dL — AB (ref 8.4–10.4)
CHLORIDE: 92 mmol/L — AB (ref 98–109)
CO2: 35 mmol/L — AB (ref 22–29)
CREATININE: 1.8 mg/dL — AB (ref 0.60–1.10)
GFR calc Af Amer: 32 mL/min — ABNORMAL LOW (ref >60)
GFR calc non Af Amer: 27 mL/min — ABNORMAL LOW (ref >60)
GLUCOSE: 177 mg/dL — AB (ref 70–140)
Potassium: 3 mmol/L — ABNORMAL LOW (ref 3.3–4.7)
SODIUM: 139 mmol/L (ref 136–145)
Total Bilirubin: 0.4 mg/dL (ref 0.2–1.2)
Total Protein: 7.6 g/dL (ref 6.4–8.3)

## 2017-07-21 ENCOUNTER — Telehealth: Payer: Self-pay | Admitting: *Deleted

## 2017-07-21 DIAGNOSIS — N179 Acute kidney failure, unspecified: Secondary | ICD-10-CM | POA: Diagnosis not present

## 2017-07-21 DIAGNOSIS — S92312D Displaced fracture of first metatarsal bone, left foot, subsequent encounter for fracture with routine healing: Secondary | ICD-10-CM | POA: Diagnosis not present

## 2017-07-21 DIAGNOSIS — I5033 Acute on chronic diastolic (congestive) heart failure: Secondary | ICD-10-CM | POA: Diagnosis not present

## 2017-07-21 DIAGNOSIS — E118 Type 2 diabetes mellitus with unspecified complications: Secondary | ICD-10-CM | POA: Diagnosis not present

## 2017-07-21 MED ORDER — POTASSIUM CHLORIDE CRYS ER 20 MEQ PO TBCR: 20.0000 meq | EXTENDED_RELEASE_TABLET | Freq: Every day | ORAL | 0 refills | Status: DC

## 2017-07-21 NOTE — Telephone Encounter (Signed)
Notified pt  By VM . Rx sent to pharmacy

## 2017-07-21 NOTE — Telephone Encounter (Signed)
-----  Message from Curt Bears, MD sent at 07/20/2017  8:45 PM EST ----- Please Rx K Dur 20 meq po qd x 7 days. ----- Message ----- From: Interface, Lab In [place] Sent: 07/20/2017  10:15 AM To: Curt Bears, MD

## 2017-07-23 ENCOUNTER — Encounter: Payer: Self-pay | Admitting: Internal Medicine

## 2017-07-23 ENCOUNTER — Inpatient Hospital Stay (HOSPITAL_BASED_OUTPATIENT_CLINIC_OR_DEPARTMENT_OTHER): Payer: Medicare Other | Admitting: Internal Medicine

## 2017-07-23 ENCOUNTER — Telehealth: Payer: Self-pay | Admitting: Internal Medicine

## 2017-07-23 DIAGNOSIS — E118 Type 2 diabetes mellitus with unspecified complications: Secondary | ICD-10-CM | POA: Diagnosis not present

## 2017-07-23 DIAGNOSIS — C3490 Malignant neoplasm of unspecified part of unspecified bronchus or lung: Secondary | ICD-10-CM

## 2017-07-23 DIAGNOSIS — Z85118 Personal history of other malignant neoplasm of bronchus and lung: Secondary | ICD-10-CM | POA: Diagnosis not present

## 2017-07-23 DIAGNOSIS — H9202 Otalgia, left ear: Secondary | ICD-10-CM | POA: Diagnosis not present

## 2017-07-23 DIAGNOSIS — Z9221 Personal history of antineoplastic chemotherapy: Secondary | ICD-10-CM

## 2017-07-23 DIAGNOSIS — I5033 Acute on chronic diastolic (congestive) heart failure: Secondary | ICD-10-CM | POA: Diagnosis not present

## 2017-07-23 DIAGNOSIS — S92512D Displaced fracture of proximal phalanx of left lesser toe(s), subsequent encounter for fracture with routine healing: Secondary | ICD-10-CM | POA: Diagnosis not present

## 2017-07-23 NOTE — Telephone Encounter (Signed)
Gave avs and calendar for January

## 2017-07-23 NOTE — Progress Notes (Signed)
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   PRIOR THERAPY:  1. Status post right lower lobe superior segmentectomy with lymph node dissection under the care of Dr. Arlyce Dice on July 20, 2008. 2. Status post adjuvant chemotherapy with carboplatin and etoposide for a total of 4 cycles. Last dose was given 10/25/2008.  CURRENT THERAPY: Observation.  CHEMOTHERAPY INTENT: Curative  CURRENT # OF CHEMOTHERAPY CYCLES: 0  CURRENT ANTIEMETICS: None  CURRENT SMOKING STATUS: Former smoker quit in 2008  ORAL CHEMOTHERAPY AND CONSENT: None  CURRENT BISPHOSPHONATES USE: None  PAIN MANAGEMENT: 0/10  NARCOTICS INDUCED CONSTIPATION: None  LIVING WILL AND CODE STATUS: No code Blue.   INTERVAL HISTORY: Amy Shepard 72 y.o. female returns to the clinic today for follow-up visit.  The patient is feeling fine with no specific complaints.  She is currently a resident at the skilled nursing facility for rehabilitation after she broke her toes and she has internal fixation.  She is expected to go home next week.  She denied having any current chest pain but continues to have baseline shortness of breath with no cough or hemoptysis.  She has no recent weight loss or night sweats.  She has no nausea, vomiting, diarrhea or constipation.  The patient had repeat CT scan of the chest performed recently and she is here for evaluation and discussion of her risk her results.  MEDICAL HISTORY: Past Medical History:  Diagnosis Date  . Arthritis   . CAD (coronary artery disease)    a. Prior cath 2015 showed 40% prox AD, 50-50% mLAD, otherwise calcification but no obstruction in LCx/RCA.. Medical management recommended. b. 2017: low-risk NST.  [name]  Chronic diastolic CHF (congestive heart failure) ([place])   . Chronic respiratory failure ([place])   . CKD (chronic kidney disease), stage III ([place])   . COPD (chronic obstructive pulmonary disease) ([place])   . Cor pulmonale ([name])    a. felt due to advanced COPD and noncompliance with O2.  . Depression   . Diabetes mellitus    [name]    dx  2008  . Fracture    left foot  . Hx of seasonal allergies   . Hypercholesteremia   . Hypertension    on no medications  . Lung cancer ([place])   . On home oxygen therapy    "2.5L; 24/7" (03/10/2017)  . Pericardial effusion    a. small-moderate in 07/2016.  [name] UTI (urinary tract infection)     ALLERGIES:  is allergic to amoxicillin.  MEDICATIONS:  Current Outpatient Medications  Medication Sig Dispense Refill  . acetaminophen (TYLENOL) 325 MG tablet Take 650 mg by mouth every 6 (six) hours as needed for moderate pain or fever.     [name] aspirin EC 81 MG tablet Take 81 mg by mouth daily.     . Carboxymethylcellulose Sod PF (REFRESH CELLUVISC) 1 % GEL Place 1 drop into both eyes 2 (two) times daily.    . Fluticasone-Salmeterol (ADVAIR) 250-50 MCG/DOSE AEPB Inhale 1 puff into the lungs 2 (two) times daily.    . furosemide (LASIX) 40 MG tablet Take 1.5 tablets (60 mg total) by mouth 2 (two) times daily. 30 tablet 2  . gabapentin (NEURONTIN) 300 MG capsule Take 300 mg  by mouth 3 (three) times daily.    [name] guaiFENesin (MUCINEX) 600 MG 12 hr tablet Take 1 tablet (600 mg total) by mouth 2 (two) times daily as needed for cough or to loosen phlegm. 20 tablet 0  . insulin aspart (NOVOLOG) 100 UNIT/ML injection Inject 4 Units into the skin 3 (three) times daily with meals. (Patient taking differently: Inject 4 Units into the skin 3 (three) times daily with meals. Per sliding scale: BGL 101-150 = 2 units; 151-200 = 3 units; 201-250 = 5 units; 251-300 = 7 units; 301-350 = 9 units; >350 = 11 units; CALL MD IF BGL IS >400 OR <60) 10 mL 0  . Insulin Detemir (LEVEMIR FLEXTOUCH) 100  UNIT/ML Pen Inject 30 Units into the skin at bedtime.    . OXYGEN Inhale 3 L into the lungs continuous. FOR COPD    . potassium chloride SA (K-DUR,KLOR-CON) 20 MEQ tablet Take 1 tablet (20 mEq total) by mouth daily. 7 tablet 0  . rosuvastatin (CRESTOR) 40 MG tablet Take 1 tablet (40 mg total) by mouth daily at 6 PM. (Patient taking differently: Take 40 mg by mouth every evening. ) 90 tablet 3  . sertraline (ZOLOFT) 100 MG tablet Take 150 mg by mouth daily.     [name] HYDROcodone-acetaminophen (NORCO/VICODIN) 5-325 MG tablet Take 1 tablet by mouth every 4 (four) hours as needed for moderate pain. (Patient not taking: Reported on 07/23/2017) 30 tablet 0  . ipratropium-albuterol (DUONEB) 0.5-2.5 (3) MG/3ML SOLN Take 3 mLs by nebulization every 4 (four) hours as needed (wheezing, Shortness of breath). (Patient not taking: Reported on 07/23/2017) 360 mL 0  . spironolactone (ALDACTONE) 25 MG tablet Take 1 tablet (25 mg total) by mouth daily. 30 tablet 0  . traMADol (ULTRAM) 50 MG tablet Take 1 tablet (50 mg total) by mouth every 8 (eight) hours as needed (for pain). (Patient not taking: Reported on 07/23/2017) 10 tablet 0   No current facility-administered medications for this visit.    Facility-Administered Medications Ordered in Other Visits  Medication Dose Route Frequency Provider Last Rate Last Dose  . diatrizoate meglumine-sodium (GASTROGRAFIN) 66-10 % solution 30 mL  30 mL Oral PRN Bjorn Loser, MD   30 mL at 11/30/15 0820    SURGICAL HISTORY:  Past Surgical History:  Procedure Laterality Date  . [facility] or [facility] center in [place]     bilateral cataracts  . LEFT HEART CATHETERIZATION WITH CORONARY ANGIOGRAM N/A 07/12/2014   Procedure: LEFT HEART CATHETERIZATION WITH CORONARY ANGIOGRAM;  Surgeon: [name], MD;  Location[facility] C[facility] LAB;  Service: Cardiovascular;  Laterality: N/A;  . MAXIMUM ACCESS (MAS)POSTERIOR LUMBAR INTERBODY FUSION  (PLIF) 2 LEVEL N/A 02/22/2016   Procedure: Lumbar three-four - Lumbar four-five  MAXIMUM ACCESS SURGERY  POSTERIOR LUMBAR INTERBODY FUSION;  Surgeon: [name], MD;  Location: [facility] NEURO ORS;  Service: Neurosurgery;  Laterality: N/A;  . ORIF TOE FRACTURE Left 06/12/2017   Procedure: OPEN REDUCTION INTERNAL FIXATION (ORIF) BASE 1ST METATARSAL (TOE) FRACTURE;  Surgeon: Newt Minion, MD;  Location: [facility];  Service: Orthopedics;  Laterality: Left;  . THORACOTOMY Right 2010   lower  . TONSILLECTOMY      REVIEW OF SYSTEMS:  A comprehensive review of systems was negative except for: Constitutional: positive for fatigue Respiratory: positive for dyspnea on exertion   PHYSICAL EXAMINATION: General appearance: alert, cooperative and no distress Head: Normocephalic, without obvious abnormality,  atraumatic Neck: no adenopathy Lymph nodes: Cervical, supraclavicular, and axillary nodes normal. Resp: clear to auscultation bilaterally Back: symmetric, no curvature. ROM normal. No CVA tenderness. Cardio: regular rate and rhythm, S1, S2 normal, no murmur, click, rub or gallop GI: soft, non-tender; bowel sounds normal; no masses,  no organomegaly Extremities: extremities normal, atraumatic, no cyanosis or edema  ECOG PERFORMANCE STATUS: 1 - Symptomatic but completely ambulatory  Blood pressure 102/68, pulse 67, temperature 98 F (36.7 C), temperature source Oral, resp. rate 17, height _0  (1.549 m), SpO2 100 %.  LABORATORY DATA: Lab Results  Component Value Date   WBC 8.7 07/20/2017   HGB 12.2 07/20/2017   HCT 36.1 07/20/2017   MCV 82.0 07/20/2017   PLT 169 07/20/2017      Chemistry      Component Value Date/Time   NA 139 07/20/2017 0952   NA 137 04/02/2017 1535   NA 136 11/30/2015 0758   K 3.0 (L) 07/20/2017 0952   K 4.2 11/30/2015 0758   CL 92 (L) 07/20/2017 0952   CL 104 08/30/2012 0908   CO2 35 (H) 07/20/2017 0952   CO2 27 11/30/2015 0758   BUN 38 (H) 07/20/2017 0952   BUN 27  04/02/2017 1535   BUN 19.0 11/30/2015 0758   CREATININE 1.80 (H) 07/20/2017 0952   CREATININE 0.77 03/28/2016 1504   CREATININE 1.2 (H) 11/30/2015 0758      Component Value Date/Time   CALCIUM 10.5 (H) 07/20/2017 0952   CALCIUM 10.3 11/30/2015 0758   ALKPHOS 149 07/20/2017 0952   ALKPHOS 112 11/30/2015 0758   AST 21 07/20/2017 0952   AST 13 11/30/2015 0758   ALT 18 07/20/2017 0952   ALT 16 11/30/2015 0758   BILITOT 0.4 07/20/2017 0952   BILITOT 0.4 05/14/2017 1201   BILITOT 0.39 11/30/2015 0758       RADIOGRAPHIC STUDIES: Ct Chest Wo Contrast  Result Date: 07/20/2017 CLINICAL DATA:  Followup lung cancer EXAM: CT CHEST WITHOUT CONTRAST TECHNIQUE: Multidetector CT imaging of the chest was performed following the standard protocol without IV contrast. COMPARISON:  07/21/2016 FINDINGS: Cardiovascular: Normal heart size. No pericardial effusion. Aortic atherosclerosis. Calcification in the left main coronary artery, LAD, left circumflex and RCA noted. Mediastinum/Nodes: Trachea appears patent and is midline. Normal appearance of the esophagus. 7 mm left paratracheal lymph node identified, image 41 of series 2 no supraclavicular or axillary lymph nodes. Previously 11 mm. Lungs/Pleura: Pleural effusion. Postsurgical changes following right lower lobe superior segmentectomy noted.Within the anterior right lung base there is an area of interstitial thickening and ground-glass attenuation, image 92 of series 7. This is a new finding when compared with the previous exam. Subsegmental atelectasis vs. scarring noted within the anteromedial left upper lobe, new from previous exam. No focal pulmonary nodule or mass identified. Upper Abdomen: No acute abnormality. Calcified granulomas identified within the spleen. Musculoskeletal: Degenerative disc disease noted within the thoracic spine. No aggressive lytic or sclerotic bone lesions. IMPRESSION: 1. No specific findings to suggest residual or recurrence of  tumor status post right lower lobe superior segmentectomy. 2. There is a new area of ground-glass attenuation and interstitial thickening within the anterior right base, favor inflammatory/infectious etiology. Recommend follow-up imaging in 3 months to ensure resolution. 3. Scar versus subsegmental atelectasis within the anteromedial left upper lobe. New from previous exam. 4.  Aortic Atherosclerosis (ICD10-I70.0). Electronically Signed   By: Kerby Moors M.D.   On: 07/20/2017 13:07   Xr Foot Complete Left  Result Date:  07/13/2017 Radiographs of left foot show stable alignment of fixation hardware. No complicating features.   ASSESSMENT AND PLAN:  This is a very pleasant 72 years old white female with a stage IIB non-small cell lung cancer mixed with a small cell carcinoma. She status post right lower lobe superior segmentectomy with lymph node dissection followed by adjuvant systemic chemotherapy. The patient has been observation since April 2010. The patient is doing fine today with no specific complaints except for the generalized weakness and shortness of breath secondary to COPD. Repeat CT scan of the chest showed no clear evidence for disease recurrence. I discussed the scan results with the patient today and recommended for him to continue on observation with repeat CT scan of the chest in 1 year. She was advised to call immediately if she has any concerning symptoms in the interval. The patient voices understanding of current disease status and treatment options and is in agreement with the current care plan. All questions were answered. The patient knows to call the clinic with any problems, questions or concerns. We can certainly see the patient much sooner if necessary. I spent 10 minutes counseling the patient face to face. The total time spent in the appointment was 15 minutes. Disclaimer: This note was dictated with voice recognition software. Similar sounding words can  inadvertently be transcribed and may not be corrected upon review.

## 2017-07-27 ENCOUNTER — Ambulatory Visit (INDEPENDENT_AMBULATORY_CARE_PROVIDER_SITE_OTHER): Payer: Medicare Other

## 2017-07-27 ENCOUNTER — Ambulatory Visit (INDEPENDENT_AMBULATORY_CARE_PROVIDER_SITE_OTHER): Payer: Medicare Other | Admitting: Orthopedic Surgery

## 2017-07-27 ENCOUNTER — Encounter (INDEPENDENT_AMBULATORY_CARE_PROVIDER_SITE_OTHER): Payer: Self-pay | Admitting: Orthopedic Surgery

## 2017-07-27 DIAGNOSIS — S93325D Dislocation of tarsometatarsal joint of left foot, subsequent encounter: Secondary | ICD-10-CM

## 2017-07-27 NOTE — Progress Notes (Signed)
Office Visit Note   Patient: Amy Shepard           Date of Birth: April 26, 1946           MRN: [id number] Visit Date: 07/27/2017              Requested by: Maurice Small, MD [address] [place], [place] [address] PCP: Maurice Small, MD  Chief Complaint  Patient presents with  . Left Foot - Follow-up      HPI: Patient is a 72-year-old woman who presents status post internal fixation for Lisfranc fracture of the medial column left foot.  Patient states she occasionally has some pain in different locations of her foot but no pain along the medial column she is 6 weeks out from surgery.  She is ambulating in a fracture boot.  Assessment & Plan: Visit Diagnoses:  1. Lisfranc dislocation, left, subsequent encounter     Plan: We will have her wean out of the fracture boot advance to regular shoewear.  Follow-Up Instructions: Return if symptoms worsen or fail to improve.   Ortho Exam  Patient is alert, oriented, no adenopathy, well-dressed, normal affect, normal respiratory effort. Examination the incision is well-healed she has good dorsiflexion of the ankle her foot is plantigrade there are good pulses there is no tenderness to palpation around the fracture site.  Imaging: Xr Foot Complete Left  Result Date: 07/27/2017 3 view radiographs of the left foot show stable internal fixation of the medial column.  The arch is restored the alignment of the medial column is restored no hardware failure.  No images are attached to the encounter.  Labs: Lab Results  Component Value Date   HGBA1C 14.4 (H) 05/02/2017   HGBA1C 13.6 (H) 03/11/2017   HGBA1C 12.9 (H) 02/10/2017   ESRSEDRATE 37 (H) 08/13/2016   CRP 0.3 (L) 09/11/2016   REPTSTATUS 06/07/2017 FINAL 06/02/2017   CULT NO GROWTH 5 DAYS 06/02/2017   LABORGA ESCHERICHIA COLI (A) 06/02/2017    _0 (HGBA1)@  There is no height or weight on file to calculate BMI.  Orders:  Orders Placed This  Encounter  Procedures  . XR Foot Complete Left   No orders of the defined types were placed in this encounter.    Procedures: No procedures performed  Clinical Data: No additional findings.  ROS:  All other systems negative, except as noted in the HPI. Review of Systems  Objective: Vital Signs: There were no vitals taken for this visit.  Specialty Comments:  No specialty comments available.  PMFS History: Patient Active Problem List   Diagnosis Date Noted  . Lisfranc dislocation, left, subsequent encounter 06/08/2017  . Chronic respiratory failure with hypoxia ([place]) 06/03/2017  . Falls 06/03/2017  . Hypokalemia 06/03/2017  . Sepsis secondary to UTI ([place]) 06/02/2017  . Dog bite 05/02/2017  . CHF exacerbation ([place]) 05/02/2017  . Acute diastolic CHF (congestive heart failure) ([place]) 05/02/2017  . Chronic diastolic CHF (congestive heart failure) ([place]) 04/02/2017  . Cor pulmonale ([place]) 04/02/2017  . Acute on chronic respiratory failure ([place]) 03/10/2017  . CKD (chronic kidney disease), stage III ([place]) 03/10/2017  . Chronic pain 03/10/2017  . Lactic acidosis 02/10/2017  . Acute respiratory failure with hypoxia ([place]) 02/09/2017  . Acute on chronic diastolic CHF (congestive heart failure) ([place]) 06/23/2016  . HTN (hypertension) 06/23/2016  . Type 2 diabetes mellitus with complication, with long-term current use of insulin ([place]) 03/11/2016  . Depression 03/11/2016  . Chronic obstructive pulmonary disease ([name])  03/10/2016  . S/P lumbar spinal fusion 02/22/2016  . Coronary artery disease involving native coronary artery 07/12/2014  . Pain in the chest   . Malignant neoplasm of lower lobe of right lung ([place]) 05/25/2014  . Lung cancer ([place]) 09/01/2012   Past Medical History:  Diagnosis Date  . Arthritis   . CAD (coronary artery disease)    a. Prior cath 2015 showed 40% prox AD, 50-50% mLAD, otherwise calcification but no obstruction in LCx/RCA.. Medical management  recommended. b. 2017: low-risk NST.  [name] Chronic diastolic CHF (congestive heart failure) ([place])   . Chronic respiratory failure ([place])   . CKD (chronic kidney disease), stage III ([place])   . COPD (chronic obstructive pulmonary disease) ([place])   . Cor pulmonale ([name])    a. felt due to advanced COPD and noncompliance with O2.  . Depression   . Diabetes mellitus    [name]    dx  2008  . Fracture    left foot  . Hx of seasonal allergies   . Hypercholesteremia   . Hypertension    on no medications  . Lung cancer ([place])   . On home oxygen therapy    "2.5L; 24/7" (03/10/2017)  . Pericardial effusion    a. small-moderate in 07/2016.  [name] UTI (urinary tract infection)     Family History  Problem Relation Age of Onset  . Diabetes Father   . Heart failure Father   . Cancer Sister   . Diabetes Brother   . Heart failure Brother     Past Surgical History:  Procedure Laterality Date  . [place] or [facility] center in [place]     bilateral cataracts  . LEFT HEART CATHETERIZATION WITH CORONARY ANGIOGRAM N/A 07/12/2014   Procedure: LEFT HEART CATHETERIZATION WITH CORONARY ANGIOGRAM;  Surgeon: [name], MD;  Location: [facility] CATH LAB;  Service: Cardiovascular;  Laterality: N/A;  . MAXIMUM ACCESS (MAS)POSTERIOR LUMBAR INTERBODY FUSION (PLIF) 2 LEVEL N/A 02/22/2016   Procedure: Lumbar three-four - Lumbar four-five  MAXIMUM ACCESS SURGERY  POSTERIOR LUMBAR INTERBODY FUSION;  Surgeon: [name], MD;  Location: [facility];  Service: Neurosurgery;  Laterality: N/A;  . ORIF TOE FRACTURE Left 06/12/2017   Procedure: OPEN REDUCTION INTERNAL FIXATION (ORIF) BASE 1ST METATARSAL (TOE) FRACTURE;  Surgeon: Newt Minion, MD;  Location: [facility];  Service: Orthopedics;  Laterality: Left;  . THORACOTOMY Right 2010   lower  . TONSILLECTOMY     Social History   Occupational History  . Not on file  Tobacco Use  . Smoking status: Former Smoker    Packs/day: 1.00     Years: 30.00    Pack years: 30.00    Last attempt to quit: 07/14/2006    Years since quitting: 11.0  . Smokeless tobacco: Never Used  Substance and Sexual Activity  . Alcohol use: No  . Drug use: No  . Sexual activity: Not on file

## 2017-07-29 DIAGNOSIS — I5033 Acute on chronic diastolic (congestive) heart failure: Secondary | ICD-10-CM | POA: Diagnosis not present

## 2017-07-29 DIAGNOSIS — S60222A Contusion of left hand, initial encounter: Secondary | ICD-10-CM | POA: Diagnosis not present

## 2017-07-29 DIAGNOSIS — I2510 Atherosclerotic heart disease of native coronary artery without angina pectoris: Secondary | ICD-10-CM | POA: Diagnosis not present

## 2017-07-29 DIAGNOSIS — N179 Acute kidney failure, unspecified: Secondary | ICD-10-CM | POA: Diagnosis not present

## 2017-07-30 ENCOUNTER — Encounter (HOSPITAL_COMMUNITY): Payer: Self-pay | Admitting: Cardiology

## 2017-07-30 ENCOUNTER — Other Ambulatory Visit (HOSPITAL_COMMUNITY): Payer: Self-pay | Admitting: *Deleted

## 2017-07-30 ENCOUNTER — Encounter (HOSPITAL_COMMUNITY): Payer: Self-pay | Admitting: *Deleted

## 2017-07-30 ENCOUNTER — Telehealth (HOSPITAL_COMMUNITY): Payer: Self-pay | Admitting: *Deleted

## 2017-07-30 ENCOUNTER — Other Ambulatory Visit: Payer: Self-pay

## 2017-07-30 ENCOUNTER — Ambulatory Visit (HOSPITAL_COMMUNITY)
Admission: RE | Admit: 2017-07-30 | Discharge: 2017-07-30 | Disposition: A | Discharge status: Home / Self Care | Payer: Medicare Other | Source: Ambulatory Visit | Attending: Cardiology | Admitting: Cardiology

## 2017-07-30 VITALS — BP 111/52 | HR 72 | Wt 165.5 lb

## 2017-07-30 DIAGNOSIS — I2781 Cor pulmonale (chronic): Secondary | ICD-10-CM

## 2017-07-30 DIAGNOSIS — Z7982 Long term (current) use of aspirin: Secondary | ICD-10-CM | POA: Insufficient documentation

## 2017-07-30 DIAGNOSIS — N183 Chronic kidney disease, stage 3 unspecified: Secondary | ICD-10-CM

## 2017-07-30 DIAGNOSIS — Z9981 Dependence on supplemental oxygen: Secondary | ICD-10-CM | POA: Diagnosis not present

## 2017-07-30 DIAGNOSIS — Z79899 Other long term (current) drug therapy: Secondary | ICD-10-CM | POA: Insufficient documentation

## 2017-07-30 DIAGNOSIS — Z794 Long term (current) use of insulin: Secondary | ICD-10-CM | POA: Diagnosis not present

## 2017-07-30 DIAGNOSIS — E785 Hyperlipidemia, unspecified: Secondary | ICD-10-CM | POA: Insufficient documentation

## 2017-07-30 DIAGNOSIS — I130 Hypertensive heart and chronic kidney disease with heart failure and stage 1 through stage 4 chronic kidney disease, or unspecified chronic kidney disease: Secondary | ICD-10-CM | POA: Diagnosis present

## 2017-07-30 DIAGNOSIS — J9611 Chronic respiratory failure with hypoxia: Secondary | ICD-10-CM | POA: Insufficient documentation

## 2017-07-30 DIAGNOSIS — J449 Chronic obstructive pulmonary disease, unspecified: Secondary | ICD-10-CM | POA: Insufficient documentation

## 2017-07-30 DIAGNOSIS — Z85118 Personal history of other malignant neoplasm of bronchus and lung: Secondary | ICD-10-CM | POA: Insufficient documentation

## 2017-07-30 DIAGNOSIS — I5032 Chronic diastolic (congestive) heart failure: Secondary | ICD-10-CM | POA: Insufficient documentation

## 2017-07-30 DIAGNOSIS — I2510 Atherosclerotic heart disease of native coronary artery without angina pectoris: Secondary | ICD-10-CM | POA: Insufficient documentation

## 2017-07-30 DIAGNOSIS — I272 Pulmonary hypertension, unspecified: Secondary | ICD-10-CM

## 2017-07-30 DIAGNOSIS — E1122 Type 2 diabetes mellitus with diabetic chronic kidney disease: Secondary | ICD-10-CM | POA: Insufficient documentation

## 2017-07-30 LAB — BASIC METABOLIC PANEL
Anion gap: 13 (ref 5–15)
BUN: 47 mg/dL — ABNORMAL HIGH (ref 6–20)
CHLORIDE: 92 mmol/L — AB (ref 101–111)
CO2: 30 mmol/L (ref 22–32)
Calcium: 9.4 mg/dL (ref 8.9–10.3)
Creatinine, Ser: 2.02 mg/dL — ABNORMAL HIGH (ref 0.44–1.00)
GFR calc non Af Amer: 24 mL/min — ABNORMAL LOW (ref >60)
GFR, EST AFRICAN AMERICAN: 28 mL/min — AB (ref >60)
Glucose, Bld: 303 mg/dL — ABNORMAL HIGH (ref 65–99)
POTASSIUM: 2.7 mmol/L — AB (ref 3.5–5.1)
SODIUM: 135 mmol/L (ref 135–145)

## 2017-07-30 MED ORDER — POTASSIUM CHLORIDE CRYS ER 20 MEQ PO TBCR: 40.0000 meq | EXTENDED_RELEASE_TABLET | Freq: Two times a day (BID) | ORAL | 3 refills | Status: DC

## 2017-07-30 NOTE — Progress Notes (Signed)
PCP: Dr. Justin Mend HF Cardiology: Dr. Aundra Dubin  72 yo with history of hypoxemic respiratory failure, chronic diastolic CHF, pulmonary hypertension, prior lung cancer, and CKD stage 3 returns for followup of pulmonary hypertension.  Patient wears home oxygen.  PFTs in 3/18 showed moderate-severe restriction but only minimal obstruction.  CTA chest in 1/18 showed relatively normal lung parenchyma and V/Q scan in 8/18 showed no chronic or acute PE.  Echo in 8/18 showed normal LV EF with dilated and dysfunctional RV.  She was admitted in 10/18 with acute/chronic diastolic CHF and was diuresed with IV Lasix.  She is currently on Lasix 40 mg bid.    Currently, she lives at [name].  We had planned a RHC in the fall but she developed a Lisfranc fracture in her left foot requiring internal fixation and also had an admission with urosepsis.  RHC was postponed. Echo in 11/18 showed EF 55%, RV moderately dilated, PASP 51 mmHg.   She is now doing better.  Still using a walker post-operation on her left foot. She is short of breath walking short distances.  She will be going home from [name] next Monday.  No chest pain.  No orthopnea/PND.  No lightheadedness/falls.  Weight down 5 lbs. She continues on 3L oxygen by nasal cannula.   Labs (10/18): K 4, creatinine 1.46, hgb 14.5 Labs (1/19): K 3, creatinine 1.8  PMH: 1. Chronic hypoxemic respiratory failure: On home oxygen.  2. COPD: This diagnosis has been listed, but PFTs in 3/18 did not show significant obstruction.  3. Chronic diastolic CHF: With cor pulmonale.  - Echo (8/18): EF 65-70%, severe RV dilation with decreased RV systolic function, PASP 67 mmHg.  - Echo (11/18): EF 55%, PASP 51 mmHg, RV moderately dilated.  4. Type II diabetes 5. HTN 6. Hyperlipidemia 7. CKD: Stage 3.  8. CAD: LHC (2015) with 40% pLAD, 50% mLAD.  9. Lung cancer: Treated in 2010 with chemotherapy and resection.  No recurrence.  10. Pulmonary hypertension: Possible group 1  PH.   - V/Q scan negative for chronic PE 8/18.  - CTA chest (1/18): No PE, no emphysema, no ILD. - PFTs (3/18): moderate-severe restriction with minimal obstruction, severely decreased DLCO. FVC 63%, FEV1 72%, ratio 114%.  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   SH: Lives at SNF ([name]), son and daughter in [place], quit smoking in 2008.   Family History  Problem Relation Age of Onset  . Diabetes Father   . Heart failure Father   . Cancer Sister   . Diabetes Brother   . Heart failure Brother    ROS: All systems reviewed and negative except as per HPI.  Current Outpatient Medications  Medication Sig Dispense Refill  . acetaminophen (TYLENOL) 325 MG tablet Take 650 mg by mouth every 6 (six) hours as needed for moderate pain or fever.     [name] aspirin EC 81 MG tablet Take 81 mg by mouth daily.     . Carboxymethylcellulose Sod PF (REFRESH CELLUVISC) 1 % GEL Place 1 drop into both eyes 2 (two) times daily.    . Fluticasone-Salmeterol (ADVAIR) 250-50 MCG/DOSE AEPB Inhale 1 puff into the lungs 2 (two) times daily.    . furosemide (LASIX) 40 MG tablet Take 1.5 tablets (60 mg total) by mouth 2 (two) times daily. 30 tablet 2  . gabapentin (NEURONTIN) 300 MG capsule Take 300 mg by mouth 3 (three) times daily.    [name] guaiFENesin (MUCINEX) 600 MG 12 hr  tablet Take 1 tablet (600 mg total) by mouth 2 (two) times daily as needed for cough or to loosen phlegm. 20 tablet 0  . HYDROcodone-acetaminophen (NORCO/VICODIN) 5-325 MG tablet Take 1 tablet by mouth every 4 (four) hours as needed for moderate pain. 30 tablet 0  . insulin aspart (NOVOLOG) 100 UNIT/ML injection Inject 4 Units into the skin 3 (three) times daily with meals. (Patient taking differently: Inject 4 Units into the skin 3 (three) times daily with meals. Per sliding scale: BGL 101-150 = 2 units; 151-200 = 3 units; 201-250 = 5 units; 251-300 = 7 units; 301-350 = 9 units; >350 = 11 units; CALL MD IF BGL IS  >400 OR <60) 10 mL 0  . Insulin Detemir (LEVEMIR FLEXTOUCH) 100 UNIT/ML Pen Inject 30 Units into the skin at bedtime.    [name] ipratropium-albuterol (DUONEB) 0.5-2.5 (3) MG/3ML SOLN Take 3 mLs by nebulization every 4 (four) hours as needed (wheezing, Shortness of breath). 360 mL 0  . OXYGEN Inhale 3 L into the lungs continuous. FOR COPD    . potassium chloride SA (K-DUR,KLOR-CON) 20 MEQ tablet Take 2 tablets (40 mEq total) by mouth 2 (two) times daily. 120 tablet 3  . rosuvastatin (CRESTOR) 40 MG tablet Take 1 tablet (40 mg total) by mouth daily at 6 PM. 90 tablet 3  . sertraline (ZOLOFT) 100 MG tablet Take 150 mg by mouth daily.     [name] spironolactone (ALDACTONE) 25 MG tablet Take 1 tablet (25 mg total) by mouth daily. 30 tablet 0  . traMADol (ULTRAM) 50 MG tablet Take 1 tablet (50 mg total) by mouth every 8 (eight) hours as needed (for pain). 10 tablet 0   No current facility-administered medications for this encounter.    Facility-Administered Medications Ordered in Other Encounters  Medication Dose Route Frequency Provider Last Rate Last Dose  . diatrizoate meglumine-sodium (GASTROGRAFIN) 66-10 % solution 30 mL  30 mL Oral PRN MacDiarmid, Nicki Reaper, MD   30 mL at 11/30/15 0820   BP (!) 111/52   Pulse 72   Wt 165 lb 8 oz (75.1 kg)   SpO2 100% Comment: on 3L of O2  BMI 31.27 kg/m  General: NAD Neck: JVP 8 cm, no thyromegaly or thyroid nodule.  Lungs: Clear to auscultation bilaterally with normal respiratory effort. CV: Nondisplaced PMI.  Heart regular S1/S2, no S3/S4, no murmur.  1+ edema 1/2 up lower legs bilaterally.  No carotid bruit.  Normal pedal pulses.  Abdomen: Soft, nontender, no hepatosplenomegaly, no distention.  Skin: Intact without lesions or rashes.  Neurologic: Alert and oriented x 3.  Psych: Normal affect. Extremities: No clubbing or cyanosis.  HEENT: Normal.   Assessment/Plan: 1. 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  Restrictive PFTs but lung parenchyma looked  relatively normal on 1/18 CTA chest.  Findings by PFTs and CT not consistent with emphysema. V/Q scan negative for chronic or acute PEs. Serologic workup was negative. Echo in 8/18 showed dilated and dysfunctional RV, similar in 11/18.  - She will need a sleep study => will arrange.  - She still needs RHC to establish definitive diagnosis of PAH and assess right and left-sided filling pressures.  We discussed risks/benefits of RHC today and she agreed to proceed with study.  - Continue to wear home oxygen.  2. Chronic hypoxemic respiratory failure: ?Primarily due to pulmonary hypertension.  3. Chronic diastolic CHF: With prominent RV failure. Mild volume overload on exam.  - Keep Lasix at 60 mg bid for now.  Check BMET as K low and creatinine elevated recently.  4. CKD: Stage 3.  Follow BMET as above.  5. CAD: Nonobstructive in 2015.  No exertional chest pain.  Continue statin and ASA 81.   Followup post-cath.   [name] 07/30/2017

## 2017-07-30 NOTE — Patient Instructions (Signed)
Labs today (will call for abnormal results, otherwise no news is good news)  Sleep study has been ordered for you, we will schedule at checkout.  Right heart catheterization has been ordered for you, please see attached instruction sheet.  Follow up in 1 Month

## 2017-07-30 NOTE — Telephone Encounter (Signed)
Lab called with critical potassium- 2.7.  Dr. Aundra Dubin said to increase potassium to 40 mEq BID.  New prescription faxed today to [name] at [phone].

## 2017-07-30 NOTE — H&P (View-Only) (Signed)
PCP: Dr. Justin Mend HF Cardiology: Dr. Aundra Dubin  72 yo with history of hypoxemic respiratory failure, chronic diastolic CHF, pulmonary hypertension, prior lung cancer, and CKD stage 3 returns for followup of pulmonary hypertension.  Patient wears home oxygen.  PFTs in 3/18 showed moderate-severe restriction but only minimal obstruction.  CTA chest in 1/18 showed relatively normal lung parenchyma and V/Q scan in 8/18 showed no chronic or acute PE.  Echo in 8/18 showed normal LV EF with dilated and dysfunctional RV.  She was admitted in 10/18 with acute/chronic diastolic CHF and was diuresed with IV Lasix.  She is currently on Lasix 40 mg bid.    Currently, she lives at [name].  We had planned a RHC in the fall but she developed a Lisfranc fracture in her left foot requiring internal fixation and also had an admission with urosepsis.  RHC was postponed. Echo in 11/18 showed EF 55%, RV moderately dilated, PASP 51 mmHg.   She is now doing better.  Still using a walker post-operation on her left foot. She is short of breath walking short distances.  She will be going home from [name] next Monday.  No chest pain.  No orthopnea/PND.  No lightheadedness/falls.  Weight down 5 lbs. She continues on 3L oxygen by nasal cannula.   Labs (10/18): K 4, creatinine 1.46, hgb 14.5 Labs (1/19): K 3, creatinine 1.8  PMH: 1. Chronic hypoxemic respiratory failure: On home oxygen.  2. COPD: This diagnosis has been listed, but PFTs in 3/18 did not show significant obstruction.  3. Chronic diastolic CHF: With cor pulmonale.  - Echo (8/18): EF 65-70%, severe RV dilation with decreased RV systolic function, PASP 67 mmHg.  - Echo (11/18): EF 55%, PASP 51 mmHg, RV moderately dilated.  4. Type II diabetes 5. HTN 6. Hyperlipidemia 7. CKD: Stage 3.  8. CAD: LHC (2015) with 40% pLAD, 50% mLAD.  9. Lung cancer: Treated in 2010 with chemotherapy and resection.  No recurrence.  10. Pulmonary hypertension: Possible group 1  PH.   - V/Q scan negative for chronic PE 8/18.  - CTA chest (1/18): No PE, no emphysema, no ILD. - PFTs (3/18): moderate-severe restriction with minimal obstruction, severely decreased DLCO. FVC 63%, FEV1 72%, ratio 114%.  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   SH: Lives at SNF ([name]), son and daughter in [place], quit smoking in 2008.   Family History  Problem Relation Age of Onset  . Diabetes Father   . Heart failure Father   . Cancer Sister   . Diabetes Brother   . Heart failure Brother    ROS: All systems reviewed and negative except as per HPI.  Current Outpatient Medications  Medication Sig Dispense Refill  . acetaminophen (TYLENOL) 325 MG tablet Take 650 mg by mouth every 6 (six) hours as needed for moderate pain or fever.     [name] aspirin EC 81 MG tablet Take 81 mg by mouth daily.     . Carboxymethylcellulose Sod PF (REFRESH CELLUVISC) 1 % GEL Place 1 drop into both eyes 2 (two) times daily.    . Fluticasone-Salmeterol (ADVAIR) 250-50 MCG/DOSE AEPB Inhale 1 puff into the lungs 2 (two) times daily.    . furosemide (LASIX) 40 MG tablet Take 1.5 tablets (60 mg total) by mouth 2 (two) times daily. 30 tablet 2  . gabapentin (NEURONTIN) 300 MG capsule Take 300 mg by mouth 3 (three) times daily.    [name] guaiFENesin (MUCINEX) 600 MG 12 hr  tablet Take 1 tablet (600 mg total) by mouth 2 (two) times daily as needed for cough or to loosen phlegm. 20 tablet 0  . HYDROcodone-acetaminophen (NORCO/VICODIN) 5-325 MG tablet Take 1 tablet by mouth every 4 (four) hours as needed for moderate pain. 30 tablet 0  . insulin aspart (NOVOLOG) 100 UNIT/ML injection Inject 4 Units into the skin 3 (three) times daily with meals. (Patient taking differently: Inject 4 Units into the skin 3 (three) times daily with meals. Per sliding scale: BGL 101-150 = 2 units; 151-200 = 3 units; 201-250 = 5 units; 251-300 = 7 units; 301-350 = 9 units; >350 = 11 units; CALL MD IF BGL IS  >400 OR <60) 10 mL 0  . Insulin Detemir (LEVEMIR FLEXTOUCH) 100 UNIT/ML Pen Inject 30 Units into the skin at bedtime.    [name] ipratropium-albuterol (DUONEB) 0.5-2.5 (3) MG/3ML SOLN Take 3 mLs by nebulization every 4 (four) hours as needed (wheezing, Shortness of breath). 360 mL 0  . OXYGEN Inhale 3 L into the lungs continuous. FOR COPD    . potassium chloride SA (K-DUR,KLOR-CON) 20 MEQ tablet Take 2 tablets (40 mEq total) by mouth 2 (two) times daily. 120 tablet 3  . rosuvastatin (CRESTOR) 40 MG tablet Take 1 tablet (40 mg total) by mouth daily at 6 PM. 90 tablet 3  . sertraline (ZOLOFT) 100 MG tablet Take 150 mg by mouth daily.     [name] spironolactone (ALDACTONE) 25 MG tablet Take 1 tablet (25 mg total) by mouth daily. 30 tablet 0  . traMADol (ULTRAM) 50 MG tablet Take 1 tablet (50 mg total) by mouth every 8 (eight) hours as needed (for pain). 10 tablet 0   No current facility-administered medications for this encounter.    Facility-Administered Medications Ordered in Other Encounters  Medication Dose Route Frequency Provider Last Rate Last Dose  . diatrizoate meglumine-sodium (GASTROGRAFIN) 66-10 % solution 30 mL  30 mL Oral PRN MacDiarmid, Nicki Reaper, MD   30 mL at 11/30/15 0820   BP (!) 111/52   Pulse 72   Wt 165 lb 8 oz (75.1 kg)   SpO2 100% Comment: on 3L of O2  BMI 31.27 kg/m  General: NAD Neck: JVP 8 cm, no thyromegaly or thyroid nodule.  Lungs: Clear to auscultation bilaterally with normal respiratory effort. CV: Nondisplaced PMI.  Heart regular S1/S2, no S3/S4, no murmur.  1+ edema 1/2 up lower legs bilaterally.  No carotid bruit.  Normal pedal pulses.  Abdomen: Soft, nontender, no hepatosplenomegaly, no distention.  Skin: Intact without lesions or rashes.  Neurologic: Alert and oriented x 3.  Psych: Normal affect. Extremities: No clubbing or cyanosis.  HEENT: Normal.   Assessment/Plan: 1. 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  Restrictive PFTs but lung parenchyma looked  relatively normal on 1/18 CTA chest.  Findings by PFTs and CT not consistent with emphysema. V/Q scan negative for chronic or acute PEs. Serologic workup was negative. Echo in 8/18 showed dilated and dysfunctional RV, similar in 11/18.  - She will need a sleep study => will arrange.  - She still needs RHC to establish definitive diagnosis of PAH and assess right and left-sided filling pressures.  We discussed risks/benefits of RHC today and she agreed to proceed with study.  - Continue to wear home oxygen.  2. Chronic hypoxemic respiratory failure: ?Primarily due to pulmonary hypertension.  3. Chronic diastolic CHF: With prominent RV failure. Mild volume overload on exam.  - Keep Lasix at 60 mg bid for now.  Check BMET as K low and creatinine elevated recently.  4. CKD: Stage 3.  Follow BMET as above.  5. CAD: Nonobstructive in 2015.  No exertional chest pain.  Continue statin and ASA 81.   Followup post-cath.   [name] 07/30/2017

## 2017-08-03 ENCOUNTER — Telehealth (HOSPITAL_COMMUNITY): Payer: Self-pay | Admitting: Vascular Surgery

## 2017-08-03 NOTE — Telephone Encounter (Signed)
Called pt twice to give Sleep Study appt 08/17/17, pt does not have VM , will try back

## 2017-08-04 ENCOUNTER — Telehealth (HOSPITAL_COMMUNITY): Payer: Self-pay

## 2017-08-04 DIAGNOSIS — I5032 Chronic diastolic (congestive) heart failure: Secondary | ICD-10-CM

## 2017-08-04 MED ORDER — POTASSIUM CHLORIDE CRYS ER 20 MEQ PO TBCR: EXTENDED_RELEASE_TABLET | ORAL | 3 refills | Status: DC

## 2017-08-04 MED ORDER — FUROSEMIDE 40 MG PO TABS: 40.0000 mg | ORAL_TABLET | Freq: Two times a day (BID) | ORAL | 3 refills | Status: DC

## 2017-08-04 NOTE — Telephone Encounter (Signed)
Notes recorded by Shirley Muscat, RN on 08/04/2017 at 4:59 PM EST Pt aware of results. agreeable to med changes (changes made in [facility]). And Labs drawn today (if we do not call you, then your lab work was stable)   ------  Notes recorded by Shirley Muscat, RN on 07/31/2017 at 9:19 AM EST Called unable to leave VM ------  Notes recorded by Kerry Dory, CMA on 07/30/2017 at 4:22 PM EST Unable to reach patient. No answer unable to leave message, no VM [phone]  ------  Notes recorded by Larey Dresser, MD on 07/30/2017 at 4:04 PM EST Decrease Lasix to 40 mg bid, increase KCl to 40 mEq qam/20 mEq qpm. BMET 1 week.

## 2017-08-05 ENCOUNTER — Telehealth (HOSPITAL_COMMUNITY): Payer: Self-pay | Admitting: *Deleted

## 2017-08-05 NOTE — Telephone Encounter (Signed)
Entered in error

## 2017-08-06 ENCOUNTER — Encounter (HOSPITAL_COMMUNITY): Payer: Self-pay | Admitting: Cardiology

## 2017-08-06 ENCOUNTER — Ambulatory Visit (HOSPITAL_COMMUNITY)
Admission: RE | Admit: 2017-08-06 | Discharge: 2017-08-06 | Disposition: A | Discharge status: Home / Self Care | Payer: Medicare Other | Source: Ambulatory Visit | Attending: Cardiology | Admitting: Cardiology

## 2017-08-06 ENCOUNTER — Encounter (HOSPITAL_COMMUNITY)
Admission: RE | Disposition: A | Discharge status: Home / Self Care | Payer: Self-pay | Source: Ambulatory Visit | Attending: Cardiology

## 2017-08-06 DIAGNOSIS — I130 Hypertensive heart and chronic kidney disease with heart failure and stage 1 through stage 4 chronic kidney disease, or unspecified chronic kidney disease: Secondary | ICD-10-CM | POA: Diagnosis not present

## 2017-08-06 DIAGNOSIS — Z87891 Personal history of nicotine dependence: Secondary | ICD-10-CM | POA: Insufficient documentation

## 2017-08-06 DIAGNOSIS — J9611 Chronic respiratory failure with hypoxia: Secondary | ICD-10-CM | POA: Diagnosis not present

## 2017-08-06 DIAGNOSIS — E785 Hyperlipidemia, unspecified: Secondary | ICD-10-CM | POA: Insufficient documentation

## 2017-08-06 DIAGNOSIS — Z9981 Dependence on supplemental oxygen: Secondary | ICD-10-CM | POA: Insufficient documentation

## 2017-08-06 DIAGNOSIS — Z7982 Long term (current) use of aspirin: Secondary | ICD-10-CM | POA: Insufficient documentation

## 2017-08-06 DIAGNOSIS — E1122 Type 2 diabetes mellitus with diabetic chronic kidney disease: Secondary | ICD-10-CM | POA: Diagnosis not present

## 2017-08-06 DIAGNOSIS — N183 Chronic kidney disease, stage 3 (moderate): Secondary | ICD-10-CM | POA: Diagnosis not present

## 2017-08-06 DIAGNOSIS — Z79899 Other long term (current) drug therapy: Secondary | ICD-10-CM | POA: Insufficient documentation

## 2017-08-06 DIAGNOSIS — Z794 Long term (current) use of insulin: Secondary | ICD-10-CM | POA: Diagnosis not present

## 2017-08-06 DIAGNOSIS — I2721 Secondary pulmonary arterial hypertension: Secondary | ICD-10-CM | POA: Diagnosis not present

## 2017-08-06 DIAGNOSIS — I272 Pulmonary hypertension, unspecified: Secondary | ICD-10-CM | POA: Diagnosis not present

## 2017-08-06 DIAGNOSIS — I2510 Atherosclerotic heart disease of native coronary artery without angina pectoris: Secondary | ICD-10-CM | POA: Diagnosis not present

## 2017-08-06 DIAGNOSIS — J449 Chronic obstructive pulmonary disease, unspecified: Secondary | ICD-10-CM | POA: Insufficient documentation

## 2017-08-06 DIAGNOSIS — I5032 Chronic diastolic (congestive) heart failure: Secondary | ICD-10-CM | POA: Diagnosis not present

## 2017-08-06 DIAGNOSIS — Z85118 Personal history of other malignant neoplasm of bronchus and lung: Secondary | ICD-10-CM | POA: Insufficient documentation

## 2017-08-06 DIAGNOSIS — Z7951 Long term (current) use of inhaled steroids: Secondary | ICD-10-CM | POA: Diagnosis not present

## 2017-08-06 HISTORY — PX: RIGHT HEART CATH: CATH118263

## 2017-08-06 LAB — POCT I-STAT 3, VENOUS BLOOD GAS (G3P V)
ACID-BASE EXCESS: 2 mmol/L (ref 0.0–2.0)
Acid-Base Excess: 2 mmol/L (ref 0.0–2.0)
BICARBONATE: 25.8 mmol/L (ref 20.0–28.0)
BICARBONATE: 26.1 mmol/L (ref 20.0–28.0)
O2 SAT: 58 %
O2 SAT: 61 %
PH VEN: 7.467 — AB (ref 7.250–7.430)
PO2 VEN: 29 mmHg — AB (ref 32.0–45.0)
TCO2: 27 mmol/L (ref 22–32)
TCO2: 27 mmol/L (ref 22–32)
pCO2, Ven: 35.6 mmHg — ABNORMAL LOW (ref 44.0–60.0)
pCO2, Ven: 36.1 mmHg — ABNORMAL LOW (ref 44.0–60.0)
pH, Ven: 7.467 — ABNORMAL HIGH (ref 7.250–7.430)
pO2, Ven: 28 mmHg — CL (ref 32.0–45.0)

## 2017-08-06 LAB — CBC
HEMATOCRIT: 33.1 % — AB (ref 36.0–46.0)
HEMOGLOBIN: 11 g/dL — AB (ref 12.0–15.0)
MCH: 28.6 pg (ref 26.0–34.0)
MCHC: 33.2 g/dL (ref 30.0–36.0)
MCV: 86 fL (ref 78.0–100.0)
Platelets: 172 10*3/uL (ref 150–400)
RBC: 3.85 MIL/uL — ABNORMAL LOW (ref 3.87–5.11)
RDW: 16.7 % — AB (ref 11.5–15.5)
WBC: 7.4 10*3/uL (ref 4.0–10.5)

## 2017-08-06 LAB — BASIC METABOLIC PANEL
ANION GAP: 14 (ref 5–15)
BUN: 31 mg/dL — AB (ref 6–20)
CHLORIDE: 97 mmol/L — AB (ref 101–111)
CO2: 24 mmol/L (ref 22–32)
Calcium: 9.2 mg/dL (ref 8.9–10.3)
Creatinine, Ser: 1.7 mg/dL — ABNORMAL HIGH (ref 0.44–1.00)
GFR calc Af Amer: 34 mL/min — ABNORMAL LOW (ref >60)
GFR calc non Af Amer: 29 mL/min — ABNORMAL LOW (ref >60)
Glucose, Bld: 213 mg/dL — ABNORMAL HIGH (ref 65–99)
POTASSIUM: 3.1 mmol/L — AB (ref 3.5–5.1)
SODIUM: 135 mmol/L (ref 135–145)

## 2017-08-06 LAB — PROTIME-INR
INR: 0.96
Prothrombin Time: 12.7 seconds (ref 11.4–15.2)

## 2017-08-06 LAB — GLUCOSE, CAPILLARY
Glucose-Capillary: 199 mg/dL — ABNORMAL HIGH (ref 65–99)
Glucose-Capillary: 241 mg/dL — ABNORMAL HIGH (ref 65–99)

## 2017-08-06 SURGERY — Surgical Case
Anesthesia: *Unknown

## 2017-08-06 SURGERY — RIGHT HEART CATH
Anesthesia: LOCAL

## 2017-08-06 MED ORDER — SODIUM CHLORIDE 0.9% FLUSH: 3.0000 mL | Freq: Two times a day (BID) | INTRAVENOUS | Status: DC

## 2017-08-06 MED ORDER — HEPARIN (PORCINE) IN NACL 2-0.9 UNIT/ML-% IJ SOLN
INTRAMUSCULAR | Status: DC | PRN
  Administered 2017-08-06: 11:00:00

## 2017-08-06 MED ORDER — LIDOCAINE HCL 1 % IJ SOLN
INTRAMUSCULAR | Status: AC
  Filled 2017-08-06: qty 20

## 2017-08-06 MED ORDER — POTASSIUM CHLORIDE 20 MEQ/15ML (10%) PO SOLN
ORAL | Status: AC
  Filled 2017-08-06: qty 30

## 2017-08-06 MED ORDER — HEPARIN (PORCINE) IN NACL 2-0.9 UNIT/ML-% IJ SOLN
INTRAMUSCULAR | Status: AC
  Filled 2017-08-06: qty 500

## 2017-08-06 MED ORDER — SODIUM CHLORIDE 0.9% FLUSH: 3.0000 mL | INTRAVENOUS | Status: DC | PRN

## 2017-08-06 MED ORDER — LIDOCAINE HCL (PF) 1 % IJ SOLN
INTRAMUSCULAR | Status: DC | PRN
  Administered 2017-08-06: 2 mL

## 2017-08-06 MED ORDER — ACETAMINOPHEN 325 MG PO TABS: 650.0000 mg | ORAL_TABLET | ORAL | Status: DC | PRN

## 2017-08-06 MED ORDER — SODIUM CHLORIDE 0.9 % IV SOLN: 250.0000 mL | INTRAVENOUS | Status: DC | PRN

## 2017-08-06 MED ORDER — ASPIRIN 81 MG PO CHEW
CHEWABLE_TABLET | ORAL | Status: AC
  Filled 2017-08-06: qty 1

## 2017-08-06 MED ORDER — SODIUM CHLORIDE 0.9 % IV SOLN
INTRAVENOUS | Status: DC
  Administered 2017-08-06: 10:00:00 via INTRAVENOUS

## 2017-08-06 MED ORDER — POTASSIUM CHLORIDE 20 MEQ PO PACK
40.0000 meq | PACK | Freq: Once | ORAL | Status: AC
  Administered 2017-08-06: 40 meq via ORAL

## 2017-08-06 MED ORDER — ONDANSETRON HCL 4 MG/2ML IJ SOLN: 4.0000 mg | Freq: Four times a day (QID) | INTRAMUSCULAR | Status: DC | PRN

## 2017-08-06 MED ORDER — ASPIRIN 81 MG PO CHEW
81.0000 mg | CHEWABLE_TABLET | ORAL | Status: AC
  Administered 2017-08-06: 81 mg via ORAL

## 2017-08-06 SURGICAL SUPPLY — 7 items
CATH BALLN WEDGE 5F 110CM (CATHETERS) ×1 IMPLANT
GUIDEWIRE .025 260CM (WIRE) ×1 IMPLANT
PACK CARDIAC CATHETERIZATION (CUSTOM PROCEDURE TRAY) ×2 IMPLANT
SHEATH GLIDE SLENDER 4/5FR (SHEATH) ×1 IMPLANT
TRANSDUCER W/STOPCOCK (MISCELLANEOUS) ×2 IMPLANT
TUBING ART PRESS 72  MALE/FEM (TUBING) ×1
TUBING ART PRESS 72 MALE/FEM (TUBING) IMPLANT

## 2017-08-06 NOTE — Discharge Instructions (Signed)
Right heart cath- Site Care Refer to this sheet in the next few weeks. These instructions provide you with information about caring for yourself after your procedure. Your health care provider may also give you more specific instructions. Your treatment has been planned according to current medical practices, but problems sometimes occur. Call your health care provider if you have any problems or questions after your procedure. What can I expect after the procedure? After your procedure, it is typical to have the following:  Bruising at the radial site that usually fades within 1-2 weeks.  Blood collecting in the tissue (hematoma) that may be painful to the touch. It should usually decrease in size and tenderness within 1-2 weeks.  Follow these instructions at home:  Take medicines only as directed by your health care provider.  You may shower 24-48 hours after the procedure or as directed by your health care provider. Remove the bandage (dressing) and gently wash the site with plain soap and water. Pat the area dry with a clean towel. Do not rub the site, because this may cause bleeding.  Do not take baths, swim, or use a hot tub until your health care provider approves.  Check your insertion site every day for redness, swelling, or drainage.  Do not apply powder or lotion to the site.  Do not flex or bend the affected arm for 24 hours or as directed by your health care provider.  Do not push or pull heavy objects with the affected arm for 24 hours or as directed by your health care provider.  Do not lift over 10 lb (4.5 kg) for 3 days after your procedure or as directed by your health care provider.  Ask your health care provider when it is okay to: ? Return to work or school. ? Resume usual physical activities or sports. ? Resume sexual activity.  Do not drive home if you are discharged the same day as the procedure. Have someone else drive you.  You may drive 24 hours after the  procedure unless otherwise instructed by your health care provider.  Do not operate machinery or power tools for 24 hours after the procedure.  If your procedure was done as an outpatient procedure, which means that you went home the same day as your procedure, a responsible adult should be with you for the first 24 hours after you arrive home.  Keep all follow-up visits as directed by your health care provider. This is important. Contact a health care provider if:  You have a fever.  You have chills.  You have increased bleeding from the radial site. Hold pressure on the site. Get help right away if:  You have unusual pain at the radial site.  You have redness, warmth, or swelling at the radial site.  You have drainage (other than a small amount of blood on the dressing) from the radial site.  The radial site is bleeding, and the bleeding does not stop after 30 minutes of holding steady pressure on the site.  Your arm or hand becomes pale, cool, tingly, or numb. This information is not intended to replace advice given to you by your health care provider. Make sure you discuss any questions you have with your health care provider. Document Released: 08/02/2010 Document Revised: 12/06/2015 Document Reviewed: 01/16/2014 Elsevier Interactive Patient Education  2018 [employer].

## 2017-08-06 NOTE — Interval H&P Note (Signed)
History and Physical Interval Note:  08/06/2017 10:46 AM  Amy Shepard  has presented today for surgery, with the diagnosis of ph  The various methods of treatment have been discussed with the patient and family. After consideration of risks, benefits and other options for treatment, the patient has consented to  Procedure(s): RIGHT HEART CATH (N/A) as a surgical intervention .  The patient's history has been reviewed, patient examined, no change in status, stable for surgery.  I have reviewed the patient's chart and labs.  Questions were answered to the patient's satisfaction.     Tory Septer [employer]

## 2017-08-07 MED FILL — Lidocaine HCl Local Inj 1%: INTRAMUSCULAR | Qty: 20 | Status: AC

## 2017-08-10 ENCOUNTER — Other Ambulatory Visit (HOSPITAL_COMMUNITY): Payer: Self-pay

## 2017-08-10 DIAGNOSIS — I5032 Chronic diastolic (congestive) heart failure: Secondary | ICD-10-CM

## 2017-08-13 ENCOUNTER — Ambulatory Visit (HOSPITAL_COMMUNITY)
Admission: RE | Admit: 2017-08-13 | Discharge: 2017-08-13 | Disposition: A | Discharge status: Home / Self Care | Payer: Medicare Other | Source: Ambulatory Visit | Attending: Cardiology | Admitting: Cardiology

## 2017-08-13 DIAGNOSIS — I5032 Chronic diastolic (congestive) heart failure: Secondary | ICD-10-CM

## 2017-08-13 LAB — BASIC METABOLIC PANEL
ANION GAP: 12 (ref 5–15)
BUN: 21 mg/dL — ABNORMAL HIGH (ref 6–20)
CALCIUM: 9.9 mg/dL (ref 8.9–10.3)
CO2: 28 mmol/L (ref 22–32)
Chloride: 98 mmol/L — ABNORMAL LOW (ref 101–111)
Creatinine, Ser: 1.23 mg/dL — ABNORMAL HIGH (ref 0.44–1.00)
GFR, EST AFRICAN AMERICAN: 50 mL/min — AB (ref >60)
GFR, EST NON AFRICAN AMERICAN: 43 mL/min — AB (ref >60)
Glucose, Bld: 257 mg/dL — ABNORMAL HIGH (ref 65–99)
POTASSIUM: 4.8 mmol/L (ref 3.5–5.1)
Sodium: 138 mmol/L (ref 135–145)

## 2017-08-14 ENCOUNTER — Encounter (HOSPITAL_COMMUNITY): Payer: Self-pay

## 2017-08-17 ENCOUNTER — Telehealth (HOSPITAL_COMMUNITY): Payer: Self-pay | Admitting: Pharmacist

## 2017-08-17 ENCOUNTER — Encounter (HOSPITAL_BASED_OUTPATIENT_CLINIC_OR_DEPARTMENT_OTHER): Payer: Medicare Other

## 2017-08-17 MED ORDER — MACITENTAN 10 MG PO TABS: 10.0000 mg | ORAL_TABLET | Freq: Every day | ORAL | 11 refills | Status: DC

## 2017-08-17 NOTE — Telephone Encounter (Signed)
[employer] PA approved by [employer] through 07/13/18.   Ruta Hinds. Velva Harman, PharmD, BCPS, CPP Clinical Pharmacist Phone: [phone] 08/17/2017 4:28 PM

## 2017-08-18 ENCOUNTER — Other Ambulatory Visit (HOSPITAL_COMMUNITY): Payer: Self-pay | Admitting: Pharmacist

## 2017-08-27 ENCOUNTER — Ambulatory Visit (HOSPITAL_BASED_OUTPATIENT_CLINIC_OR_DEPARTMENT_OTHER): Payer: Medicare Other | Attending: Cardiology | Admitting: Cardiology

## 2017-08-27 VITALS — Ht 61.0 in | Wt 154.0 lb

## 2017-08-27 DIAGNOSIS — I5032 Chronic diastolic (congestive) heart failure: Secondary | ICD-10-CM | POA: Diagnosis not present

## 2017-08-27 DIAGNOSIS — R0683 Snoring: Secondary | ICD-10-CM | POA: Diagnosis not present

## 2017-08-27 DIAGNOSIS — I509 Heart failure, unspecified: Secondary | ICD-10-CM | POA: Insufficient documentation

## 2017-08-27 DIAGNOSIS — E119 Type 2 diabetes mellitus without complications: Secondary | ICD-10-CM | POA: Diagnosis not present

## 2017-08-27 DIAGNOSIS — G4734 Idiopathic sleep related nonobstructive alveolar hypoventilation: Secondary | ICD-10-CM

## 2017-08-27 DIAGNOSIS — G4736 Sleep related hypoventilation in conditions classified elsewhere: Secondary | ICD-10-CM

## 2017-08-27 DIAGNOSIS — R0902 Hypoxemia: Secondary | ICD-10-CM | POA: Insufficient documentation

## 2017-08-27 DIAGNOSIS — R5383 Other fatigue: Secondary | ICD-10-CM | POA: Diagnosis not present

## 2017-08-27 DIAGNOSIS — J449 Chronic obstructive pulmonary disease, unspecified: Secondary | ICD-10-CM | POA: Insufficient documentation

## 2017-08-27 DIAGNOSIS — G4761 Periodic limb movement disorder: Secondary | ICD-10-CM | POA: Insufficient documentation

## 2017-08-30 NOTE — Procedures (Signed)
NAME: Amy Shepard DATE OF BIRTH:  Aug 01, 1945 MEDICAL RECORD NUMBER [id number]  LOCATION: [facility]  PHYSICIAN: Traci Turner  DATE OF STUDY: 08/27/2017  SLEEP STUDY TYPE: Nocturnal Polysomnogram               REFERRING PHYSICIAN: Larey Dresser, MD   Gender: Female D.O.B: June 24, 1946 Age (years): 25 Referring Provider: [name] Height (inches): 33 Interpreting Physician: [name] MD, ABSM Weight (lbs): 154 RPSGT: [place]ug[place], [place] BMI: 29 MRN: [id number] Neck Size: 14.00  CLINICAL INFORMATION Sleep Study Type: NPSG  Indication for sleep study: Congestive Heart Failure, COPD, Diabetes, Fatigue  Epworth Sleepiness Score: 7  SLEEP STUDY TECHNIQUE As per the AASM Manual for the Scoring of Sleep and Associated Events v2.3 (April 2016) with a hypopnea requiring 4% desaturations.  The channels recorded and monitored were frontal, central and occipital EEG, electrooculogram (EOG), submentalis EMG (chin), nasal and oral airflow, thoracic and abdominal wall motion, anterior tibialis EMG, snore microphone, electrocardiogram, and pulse oximetry.  MEDICATIONS Medications self-administered by patient taken the night of the study : N/A  SLEEP ARCHITECTURE The study was initiated at 10:13:00 PM and ended at 4:31:58 AM.  Sleep onset time was 72.4 minutes and the sleep efficiency was 54.8%. The total sleep time was 207.6 minutes.  Stage REM latency was 270.5 minutes.  The patient spent 29.63% of the night in stage N1 sleep, 43.84% in stage N2 sleep, 18.59% in stage N3 and 7.95% in REM.  Alpha intrusion was absent.  Supine sleep was 17.34%.  RESPIRATORY PARAMETERS The overall apnea/hypopnea index (AHI) was 3.8 per hour. There were 1 total apneas, including 1 obstructive, 0 central and 0 mixed apneas. There were 12 hypopneas and 56 RERAs.  The AHI during Stage REM sleep was 0.0 per hour.  AHI while supine was 3.3 per hour.  The mean  oxygen saturation was 87.56%. The minimum SpO2 during sleep was 79.00%.  soft snoring was noted during this study.  CARDIAC DATA The 2 lead EKG demonstrated sinus rhythm. The mean heart rate was 82.49 beats per minute. Other EKG findings include: None.  LEG MOVEMENT DATA The total PLMS were 95 with a resulting PLMS index of 27.46. Associated arousal with leg movement index was 1.7 .  IMPRESSIONS - No significant obstructive sleep apnea occurred during this study (AHI = 3.8/h). - No significant central sleep apnea occurred during this study (CAI = 0.0/h). - Moderate oxygen desaturation was noted during this study (Min O2 = 79.00%). - The patient snored with soft snoring volume. - No cardiac abnormalities were noted during this study. - Moderate periodic limb movements of sleep occurred during the study. No significant associated arousals.  DIAGNOSIS - Nocturnal Hypoxemia (327.26 [G47.36 ICD-10]) - Daytime Hypoxemia - Periodic Limb Movement Disorder  RECOMMENDATIONS - Avoid alcohol, sedatives and other CNS depressants that may worsen sleep apnea and disrupt normal sleep architecture. - Sleep hygiene should be reviewed to assess factors that may improve sleep quality. - Weight management and regular exercise should be initiated or continued if appropriate. - The patient has significant hypoxemia both during sleep and during waking hours despite O2 at 3L.  Recommend referreal to Pulmonary.  [name], American Board of Sleep Medicine  ELECTRONICALLY SIGNED ON:  08/30/2017, 12:23 PM [name] PH: [phone]   FX: [phone] [name]

## 2017-08-30 NOTE — Progress Notes (Signed)
Need to increase oxygen to 4 L/hr

## 2017-08-31 ENCOUNTER — Other Ambulatory Visit (HOSPITAL_COMMUNITY): Payer: Self-pay

## 2017-08-31 ENCOUNTER — Telehealth (HOSPITAL_COMMUNITY): Payer: Self-pay

## 2017-08-31 DIAGNOSIS — I5032 Chronic diastolic (congestive) heart failure: Secondary | ICD-10-CM

## 2017-08-31 NOTE — Telephone Encounter (Signed)
Editor: Larey Dresser, MD (Physician)       _0 Hide copied text  _1 Hover for details   Need to increase oxygen to 4 L/hr       Called unable to leave VM. Changed order to 4L and contacted [facility]

## 2017-08-31 NOTE — Progress Notes (Signed)
Unable to leave VM to call back..I sent the Rx to the pharmacy rder to [facility]

## 2017-09-01 ENCOUNTER — Telehealth: Payer: Self-pay | Admitting: *Deleted

## 2017-09-01 NOTE — Telephone Encounter (Signed)
Informed patient of sleep study results and patient understanding was verbalized. Patient understands her sleep study showed no significant sleep apnea but she still has significant drops in her O2 during sleep and while awake.  Patient understands Dr Britt Boozer like her to followup with Dr. Aundra Dubin. Patient was grateful for the call and thanked me.Amy Shepard

## 2017-09-01 NOTE — Telephone Encounter (Signed)
-----  Message from Sueanne Margarita, MD sent at 08/30/2017 12:26 PM EST ----- Please let patient know that sleep study showed no significant sleep apnea but she still has significant drops in her O2 during sleep and while awake.  I would like her to followup with Dr. Aundra Dubin

## 2017-09-03 ENCOUNTER — Encounter (HOSPITAL_COMMUNITY): Payer: Medicare Other | Admitting: Cardiology

## 2017-09-03 NOTE — Telephone Encounter (Deleted)
-----  Message from Traci R Turner, MD sent at 08/30/2017 12:26 PM EST ----- Please let patient know that sleep study showed no significant sleep apnea but she still has significant drops in her O2 during sleep and while awake.  I would like her to followup with Dr. McLean 

## 2017-09-09 NOTE — Progress Notes (Signed)
Patient called back she is aware and agreeable with plan.

## 2017-09-09 NOTE — Progress Notes (Signed)
Attempted to reach patient no VM set up. Will try again tomorrow.

## 2017-10-23 ENCOUNTER — Encounter (HOSPITAL_COMMUNITY): Payer: Self-pay | Admitting: Cardiology

## 2017-10-23 ENCOUNTER — Telehealth (HOSPITAL_COMMUNITY): Payer: Self-pay

## 2017-10-23 ENCOUNTER — Ambulatory Visit (HOSPITAL_COMMUNITY)
Admission: RE | Admit: 2017-10-23 | Discharge: 2017-10-23 | Disposition: A | Discharge status: Home / Self Care | Payer: Medicare Other | Source: Ambulatory Visit | Attending: Cardiology | Admitting: Cardiology

## 2017-10-23 VITALS — BP 111/45 | HR 82 | Wt 170.4 lb

## 2017-10-23 DIAGNOSIS — J449 Chronic obstructive pulmonary disease, unspecified: Secondary | ICD-10-CM | POA: Insufficient documentation

## 2017-10-23 DIAGNOSIS — I2781 Cor pulmonale (chronic): Secondary | ICD-10-CM | POA: Diagnosis not present

## 2017-10-23 DIAGNOSIS — N183 Chronic kidney disease, stage 3 unspecified: Secondary | ICD-10-CM

## 2017-10-23 DIAGNOSIS — I2721 Secondary pulmonary arterial hypertension: Secondary | ICD-10-CM | POA: Insufficient documentation

## 2017-10-23 DIAGNOSIS — E785 Hyperlipidemia, unspecified: Secondary | ICD-10-CM

## 2017-10-23 DIAGNOSIS — E1122 Type 2 diabetes mellitus with diabetic chronic kidney disease: Secondary | ICD-10-CM | POA: Insufficient documentation

## 2017-10-23 DIAGNOSIS — Z85118 Personal history of other malignant neoplasm of bronchus and lung: Secondary | ICD-10-CM | POA: Diagnosis not present

## 2017-10-23 DIAGNOSIS — I2510 Atherosclerotic heart disease of native coronary artery without angina pectoris: Secondary | ICD-10-CM | POA: Diagnosis not present

## 2017-10-23 DIAGNOSIS — I5023 Acute on chronic systolic (congestive) heart failure: Secondary | ICD-10-CM | POA: Diagnosis not present

## 2017-10-23 DIAGNOSIS — Z794 Long term (current) use of insulin: Secondary | ICD-10-CM | POA: Insufficient documentation

## 2017-10-23 DIAGNOSIS — Z79899 Other long term (current) drug therapy: Secondary | ICD-10-CM | POA: Diagnosis not present

## 2017-10-23 DIAGNOSIS — I5032 Chronic diastolic (congestive) heart failure: Secondary | ICD-10-CM | POA: Insufficient documentation

## 2017-10-23 DIAGNOSIS — Z9981 Dependence on supplemental oxygen: Secondary | ICD-10-CM | POA: Diagnosis not present

## 2017-10-23 DIAGNOSIS — Z7982 Long term (current) use of aspirin: Secondary | ICD-10-CM | POA: Insufficient documentation

## 2017-10-23 DIAGNOSIS — I130 Hypertensive heart and chronic kidney disease with heart failure and stage 1 through stage 4 chronic kidney disease, or unspecified chronic kidney disease: Secondary | ICD-10-CM | POA: Diagnosis not present

## 2017-10-23 DIAGNOSIS — J9611 Chronic respiratory failure with hypoxia: Secondary | ICD-10-CM | POA: Diagnosis present

## 2017-10-23 DIAGNOSIS — I270 Primary pulmonary hypertension: Secondary | ICD-10-CM | POA: Diagnosis not present

## 2017-10-23 LAB — BASIC METABOLIC PANEL
ANION GAP: 12 (ref 5–15)
BUN: 51 mg/dL — AB (ref 6–20)
CHLORIDE: 96 mmol/L — AB (ref 101–111)
CO2: 28 mmol/L (ref 22–32)
Calcium: 9.6 mg/dL (ref 8.9–10.3)
Creatinine, Ser: 1.67 mg/dL — ABNORMAL HIGH (ref 0.44–1.00)
GFR calc Af Amer: 35 mL/min — ABNORMAL LOW (ref >60)
GFR calc non Af Amer: 30 mL/min — ABNORMAL LOW (ref >60)
GLUCOSE: 163 mg/dL — AB (ref 65–99)
Potassium: 4 mmol/L (ref 3.5–5.1)
SODIUM: 136 mmol/L (ref 135–145)

## 2017-10-23 LAB — LIPID PANEL
CHOL/HDL RATIO: 10.3 ratio
CHOLESTEROL: 362 mg/dL — AB (ref 0–200)
HDL: 35 mg/dL — AB (ref >40)
LDL Cholesterol: 257 mg/dL — ABNORMAL HIGH (ref 0–99)
Triglycerides: 351 mg/dL — ABNORMAL HIGH (ref <150)
VLDL: 70 mg/dL — AB (ref 0–40)

## 2017-10-23 MED ORDER — ROSUVASTATIN CALCIUM 40 MG PO TABS: 40.0000 mg | ORAL_TABLET | Freq: Every day | ORAL | 3 refills | Status: DC

## 2017-10-23 MED ORDER — POTASSIUM CHLORIDE CRYS ER 20 MEQ PO TBCR: EXTENDED_RELEASE_TABLET | ORAL | 3 refills | Status: DC

## 2017-10-23 MED ORDER — FUROSEMIDE 40 MG PO TABS: 40.0000 mg | ORAL_TABLET | Freq: Two times a day (BID) | ORAL | 3 refills | Status: DC

## 2017-10-23 MED ORDER — FUROSEMIDE 40 MG PO TABS: ORAL_TABLET | ORAL | 3 refills | Status: DC

## 2017-10-23 MED ORDER — SPIRONOLACTONE 25 MG PO TABS: 12.5000 mg | ORAL_TABLET | Freq: Every day | ORAL | 3 refills | Status: DC

## 2017-10-23 NOTE — Patient Instructions (Signed)
Start Adcirca 20 mg (1 tab) daily (they will call you)   Increase Furosemide 60 mg (1.5 tab) in AM, then 40 mg (1 tab) in PM  Decrease Spironolactone 12.5 mg (1/2 tab) daily  We did a 6 minute walk  Labs drawn today (if we do not call you, then your lab work was stable)   Your physician recommends that you return for lab work in: 10 days  You have been referred to Pulmonary Rehab (they will call you)   Your physician recommends that you schedule a follow-up appointment in: 6 weeks with Dr. Aundra Dubin

## 2017-10-23 NOTE — Telephone Encounter (Signed)
Notes recorded by Shirley Muscat, RN on 10/23/2017 at 4:25 PM EDT Pt aware of results, not taking Crestor sent in new Rx will get labs at f/u with Dr. Aundra Dubin ------  Notes recorded by Larey Dresser, MD on 10/23/2017 at 4:11 PM EDT With elevated BUN/creatinine, keep Lasix at 40 mg bid rather than increasing to 60 qam/40 qpm. LDL is markedly high. Suspect she is not taking her Crestor. Needs to start back on Crestor 40 mg daily with lipids/LFTs in 2 months.

## 2017-10-23 NOTE — Progress Notes (Signed)
Pt attempted 6 minute walk test. Pt walked 450 ft (137 meters). O2 sats ranged from 85%-97% on oxygen, and HR ranged from 72-93. No new orders at this time

## 2017-10-25 NOTE — Progress Notes (Signed)
PCP: Dr. Justin Mend HF Cardiology: Dr. Aundra Dubin  72 yo with history of hypoxemic respiratory failure, chronic diastolic CHF, pulmonary hypertension, prior lung cancer, and CKD stage 3 returns for followup of pulmonary hypertension.  Patient wears home oxygen.  PFTs in 3/18 showed moderate-severe restriction but only minimal obstruction.  CTA chest in 1/18 showed relatively normal lung parenchyma and V/Q scan in 8/18 showed no chronic or acute PE.  Echo in 8/18 showed normal LV EF with dilated and dysfunctional RV.  She was admitted in 10/18 with acute/chronic diastolic CHF and was diuresed with IV Lasix.  She is currently on Lasix 40 mg bid.    Currently, she lives at [name].  We had planned a RHC in the fall but she developed a Lisfranc fracture in her left foot requiring internal fixation and also had an admission with urosepsis.  RHC was postponed. Echo in 11/18 showed EF 55%, RV moderately dilated, PASP 51 mmHg. RHC was later done in 1/19, showing moderate PAH.   Patient has been doing a bit better recently. Now on Opsumit. Using her rollator.  Can walk 100-200 feet before becoming short of breath.  She is short of breath lifting and carrying. Chronic 2 pillow orthopnea.  No chest pain.  BP is lower but she denies lightheadedness.   6 minute walk (4/19): 137 m  Labs (10/18): K 4, creatinine 1.46, hgb 14.5 Labs (1/19): K 3, creatinine 1.8 => 1.23  PMH: 1. Chronic hypoxemic respiratory failure: On home oxygen.  2. COPD: This diagnosis has been listed, but PFTs in 3/18 did not show significant obstruction.  3. Chronic diastolic CHF: With cor pulmonale.  - Echo (8/18): EF 65-70%, severe RV dilation with decreased RV systolic function, PASP 67 mmHg.  - Echo (11/18): EF 55%, PASP 51 mmHg, RV moderately dilated.  4. Type II diabetes 5. HTN 6. Hyperlipidemia 7. CKD: Stage 3.  8. CAD: LHC (2015) with 40% pLAD, 50% mLAD.  9. Lung cancer: Treated in 2010 with chemotherapy and resection.  No  recurrence.  10. 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   - V/Q scan negative for chronic PE 8/18.  - CTA chest (1/18): No PE, no emphysema, no ILD. - PFTs (3/18): moderate-severe restriction with minimal obstruction, severely decreased DLCO. FVC 63%, FEV1 72%, ratio 114%.  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  - Sleep study (2/19): No OSA, there is nocturnal hypoxemia.  - RHC (1/19): mean RA 7, PA 55/16 mean 34, mean PCWP 15, CI 2.34.   SH: Lives at SNF ([name]), son and daughter in [place], quit smoking in 2008.   Family History  Problem Relation Age of Onset  . Diabetes Father   . Heart failure Father   . Cancer Sister   . Diabetes Brother   . Heart failure Brother    ROS: All systems reviewed and negative except as per HPI.  Current Outpatient Medications  Medication Sig Dispense Refill  . acetaminophen (TYLENOL) 325 MG tablet Take 650 mg by mouth every 6 (six) hours as needed for moderate pain or fever.     [name] aspirin EC 81 MG tablet Take 81 mg by mouth daily.     . Carboxymethylcellulose Sod PF (REFRESH CELLUVISC) 1 % GEL Place 1 drop into both eyes 2 (two) times daily.    . Cholecalciferol (VITAMIN D) 2000 units CAPS Take 2,000 Units by mouth daily.    . Fluticasone-Salmeterol (ADVAIR) 250-50 MCG/DOSE AEPB Inhale 1 puff into the lungs  2 (two) times daily.    . furosemide (LASIX) 40 MG tablet Take 1 tablet (40 mg total) by mouth 2 (two) times daily. 60 tablet 3  . gabapentin (NEURONTIN) 300 MG capsule Take 300 mg by mouth 3 (three) times daily.    [name] guaiFENesin (MUCINEX) 600 MG 12 hr tablet Take 1 tablet (600 mg total) by mouth 2 (two) times daily as needed for cough or to loosen phlegm. 20 tablet 0  . HYDROcodone-acetaminophen (NORCO/VICODIN) 5-325 MG tablet Take 1 tablet by mouth every 4 (four) hours as needed for moderate pain. 30 tablet 0  . insulin aspart (NOVOLOG) 100 UNIT/ML injection Inject 4 Units into the skin 3 (three)  times daily with meals. (Patient taking differently: Inject 6 Units into the skin 3 (three) times daily with meals. Inject 6 units with each meal and add additional units per sliding scale: BGL 101-150 = 2 units; 151-200 = 3 units; 201-250 = 5 units; 251-300 = 7 units; 301-350 = 9 units; >350 = 11 units; CALL MD IF BGL IS >400 OR <60) 10 mL 0  . Insulin Detemir (LEVEMIR FLEXTOUCH) 100 UNIT/ML Pen Inject 36 Units into the skin at bedtime.     [name] ipratropium-albuterol (DUONEB) 0.5-2.5 (3) MG/3ML SOLN Take 3 mLs by nebulization every 4 (four) hours as needed (wheezing, Shortness of breath). 360 mL 0  . Macitentan (OPSUMIT) 10 MG TABS Take 1 tablet (10 mg total) by mouth daily. 30 tablet 11  . Magnesium Hydroxide (MILK OF MAGNESIA PO) Take 30 mLs by mouth daily as needed (constipation).    . OXYGEN Inhale 3 L into the lungs continuous. FOR COPD    . potassium chloride SA (K-DUR,KLOR-CON) 20 MEQ tablet Take 40 meq (2 tabs) in PM, then 20 meq (1 tab) in PM 120 tablet 3  . rosuvastatin (CRESTOR) 40 MG tablet Take 1 tablet (40 mg total) by mouth daily at 6 PM. 30 tablet 3  . sertraline (ZOLOFT) 50 MG tablet Take 150 mg by mouth daily.     [name] spironolactone (ALDACTONE) 25 MG tablet Take 0.5 tablets (12.5 mg total) by mouth daily. 15 tablet 3  . traMADol (ULTRAM) 50 MG tablet Take 1 tablet (50 mg total) by mouth every 8 (eight) hours as needed (for pain). 10 tablet 0   No current facility-administered medications for this encounter.    Facility-Administered Medications Ordered in Other Encounters  Medication Dose Route Frequency Provider Last Rate Last Dose  . diatrizoate meglumine-sodium (GASTROGRAFIN) 66-10 % solution 30 mL  30 mL Oral PRN MacDiarmid, Nicki Reaper, MD   30 mL at 11/30/15 0820   BP (!) 111/45   Pulse 82   Wt 170 lb 6.4 oz (77.3 kg)   SpO2 90% Comment: on 3L  BMI 32.20 kg/m  General: NAD Neck: JVP 8 cm, no thyromegaly or thyroid nodule.  Lungs: Clear to auscultation bilaterally with normal  respiratory effort. CV: Nondisplaced PMI.  Heart regular S1/S2, no S3/S4, 1/6 SEM RUSB.  1+ edema 3/4 to knees bilaterally.  No carotid bruit.  Normal pedal pulses.  Abdomen: Soft, nontender, no hepatosplenomegaly, no distention.  Skin: Intact without lesions or rashes.  Neurologic: Alert and oriented x 3.  Psych: Normal affect. Extremities: No clubbing or cyanosis.  HEENT: Normal.   Assessment/Plan: 1. 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  Restrictive PFTs but lung parenchyma looked relatively normal on 1/18 CTA chest.  Findings by PFTs and CT not consistent with emphysema. V/Q scan negative for chronic or acute  PEs. Serologic workup was negative. Echo in 8/18 showed dilated and dysfunctional RV, similar in 11/18.  Sleep study showed nocturnal hypoxemia but not OSA.  RHC (1/19) showed moderate pulmonary arterial hypertension.  - Continue to wear home oxygen.  - 6 minute walk done today.  - She has started on Opsumit. - Will add tadalafil 20 mg daily and titrate up at next appt if she tolerates.  2. Chronic hypoxemic respiratory failure: ?Primarily due to pulmonary hypertension.  3. Chronic diastolic CHF: With prominent RV failure. Mild volume overload on exam.  - Increase Lasix to 60 qam/40 qpm.  BMET today and again in 10 days.  - Decrease spironolactone to 12.5 mg daily.  4. CKD: Stage 3.  Follow BMET as above.  5. CAD: Nonobstructive in 2015.  No exertional chest pain.  Continue statin and ASA 81. Check lipids today.   Followup in 6 wks.   [name] 10/25/2017

## 2017-10-27 ENCOUNTER — Other Ambulatory Visit (HOSPITAL_COMMUNITY): Payer: Self-pay | Admitting: *Deleted

## 2017-10-27 MED ORDER — POTASSIUM CHLORIDE CRYS ER 20 MEQ PO TBCR: EXTENDED_RELEASE_TABLET | ORAL | 3 refills | Status: DC

## 2017-11-02 ENCOUNTER — Other Ambulatory Visit (HOSPITAL_COMMUNITY): Payer: Medicare Other

## 2017-11-23 ENCOUNTER — Other Ambulatory Visit (HOSPITAL_COMMUNITY): Payer: Self-pay

## 2017-11-23 DIAGNOSIS — I272 Pulmonary hypertension, unspecified: Secondary | ICD-10-CM

## 2017-12-14 ENCOUNTER — Other Ambulatory Visit (HOSPITAL_COMMUNITY): Payer: Self-pay

## 2017-12-14 DIAGNOSIS — C3490 Malignant neoplasm of unspecified part of unspecified bronchus or lung: Secondary | ICD-10-CM

## 2017-12-14 DIAGNOSIS — I5022 Chronic systolic (congestive) heart failure: Secondary | ICD-10-CM

## 2017-12-15 ENCOUNTER — Encounter (HOSPITAL_COMMUNITY): Payer: Medicare Other | Admitting: Cardiology

## 2017-12-21 ENCOUNTER — Ambulatory Visit (HOSPITAL_COMMUNITY)
Admission: RE | Admit: 2017-12-21 | Discharge: 2017-12-21 | Disposition: A | Discharge status: Home / Self Care | Payer: Medicare Other | Source: Ambulatory Visit | Attending: Cardiology | Admitting: Cardiology

## 2017-12-21 VITALS — BP 96/58 | HR 70 | Wt 171.8 lb

## 2017-12-21 DIAGNOSIS — N183 Chronic kidney disease, stage 3 (moderate): Secondary | ICD-10-CM | POA: Diagnosis not present

## 2017-12-21 DIAGNOSIS — Z833 Family history of diabetes mellitus: Secondary | ICD-10-CM | POA: Diagnosis not present

## 2017-12-21 DIAGNOSIS — I270 Primary pulmonary hypertension: Secondary | ICD-10-CM

## 2017-12-21 DIAGNOSIS — I2721 Secondary pulmonary arterial hypertension: Secondary | ICD-10-CM | POA: Insufficient documentation

## 2017-12-21 DIAGNOSIS — Z8249 Family history of ischemic heart disease and other diseases of the circulatory system: Secondary | ICD-10-CM | POA: Diagnosis not present

## 2017-12-21 DIAGNOSIS — E785 Hyperlipidemia, unspecified: Secondary | ICD-10-CM | POA: Diagnosis not present

## 2017-12-21 DIAGNOSIS — I5032 Chronic diastolic (congestive) heart failure: Secondary | ICD-10-CM | POA: Diagnosis not present

## 2017-12-21 DIAGNOSIS — Z85118 Personal history of other malignant neoplasm of bronchus and lung: Secondary | ICD-10-CM | POA: Insufficient documentation

## 2017-12-21 DIAGNOSIS — Z9981 Dependence on supplemental oxygen: Secondary | ICD-10-CM | POA: Insufficient documentation

## 2017-12-21 DIAGNOSIS — Z79891 Long term (current) use of opiate analgesic: Secondary | ICD-10-CM | POA: Insufficient documentation

## 2017-12-21 DIAGNOSIS — Z794 Long term (current) use of insulin: Secondary | ICD-10-CM | POA: Diagnosis not present

## 2017-12-21 DIAGNOSIS — I2510 Atherosclerotic heart disease of native coronary artery without angina pectoris: Secondary | ICD-10-CM | POA: Diagnosis not present

## 2017-12-21 DIAGNOSIS — Z809 Family history of malignant neoplasm, unspecified: Secondary | ICD-10-CM | POA: Diagnosis not present

## 2017-12-21 DIAGNOSIS — J9611 Chronic respiratory failure with hypoxia: Secondary | ICD-10-CM | POA: Diagnosis not present

## 2017-12-21 DIAGNOSIS — I130 Hypertensive heart and chronic kidney disease with heart failure and stage 1 through stage 4 chronic kidney disease, or unspecified chronic kidney disease: Secondary | ICD-10-CM | POA: Insufficient documentation

## 2017-12-21 DIAGNOSIS — J449 Chronic obstructive pulmonary disease, unspecified: Secondary | ICD-10-CM | POA: Insufficient documentation

## 2017-12-21 DIAGNOSIS — Z7982 Long term (current) use of aspirin: Secondary | ICD-10-CM | POA: Diagnosis not present

## 2017-12-21 DIAGNOSIS — E1122 Type 2 diabetes mellitus with diabetic chronic kidney disease: Secondary | ICD-10-CM | POA: Insufficient documentation

## 2017-12-21 DIAGNOSIS — Z79899 Other long term (current) drug therapy: Secondary | ICD-10-CM | POA: Diagnosis not present

## 2017-12-21 LAB — BASIC METABOLIC PANEL
Anion gap: 11 (ref 5–15)
BUN: 46 mg/dL — AB (ref 6–20)
CHLORIDE: 98 mmol/L — AB (ref 101–111)
CO2: 31 mmol/L (ref 22–32)
Calcium: 9.6 mg/dL (ref 8.9–10.3)
Creatinine, Ser: 1.91 mg/dL — ABNORMAL HIGH (ref 0.44–1.00)
GFR calc Af Amer: 29 mL/min — ABNORMAL LOW (ref >60)
GFR calc non Af Amer: 25 mL/min — ABNORMAL LOW (ref >60)
Glucose, Bld: 171 mg/dL — ABNORMAL HIGH (ref 65–99)
POTASSIUM: 4.3 mmol/L (ref 3.5–5.1)
SODIUM: 140 mmol/L (ref 135–145)

## 2017-12-21 MED ORDER — LISINOPRIL 10 MG PO TABS: 5.0000 mg | ORAL_TABLET | Freq: Every day | ORAL | 3 refills | Status: DC

## 2017-12-21 NOTE — Patient Instructions (Signed)
Restart Crestor 40 mg (1 tab) every evening  Decrease Lisinopril 5 mg (1 tab) daily  Labs drawn today (if we do not call you, then your lab work was stable)   Pulmonary Rehab will call   Your physician recommends that you return for lab work in: 2 months    Your physician recommends that you schedule a follow-up appointment in: 2 months with Dr. Aundra Dubin

## 2017-12-21 NOTE — Progress Notes (Signed)
PCP: Dr. Justin Mend HF Cardiology: Dr. Aundra Dubin  72 y.o. with history of hypoxemic respiratory failure, chronic diastolic CHF, pulmonary hypertension, prior lung cancer, and CKD stage 3 returns for followup of pulmonary hypertension.  Patient wears home oxygen.  PFTs in 3/18 showed moderate-severe restriction but only minimal obstruction.  CTA chest in 1/18 showed relatively normal lung parenchyma and V/Q scan in 8/18 showed no chronic or acute PE.  Echo in 8/18 showed normal LV EF with dilated and dysfunctional RV.  She was admitted in 10/18 with acute/chronic diastolic CHF and was diuresed with IV Lasix.  She is currently on Lasix 40 mg bid.    Currently, she lives at [name].  We had planned a RHC in the fall but she developed a Lisfranc fracture in her left foot requiring internal fixation and also had an admission with urosepsis.  RHC was postponed. Echo in 11/18 showed EF 55%, RV moderately dilated, PASP 51 mmHg. RHC was later done in 1/19, showing moderate PAH.   Patient has been stable recently. Weight is unchanged.  Same dyspnea with heavier exertion, no dyspnea walking with her walker.  She stopped Crestor because of pain in 1 hip. No orthopnea/PND.  She gets lightheaded with standing and SBP is in 90s.  No chest pain. She is taking Opsumit but never got started on Adcirca.    6 minute walk (4/19): 137 m  Labs (10/18): K 4, creatinine 1.46, hgb 14.5 Labs (1/19): K 3, creatinine 1.8 => 1.23 Labs (4/19): LDL 257, K 4, creatinine 1.67  PMH: 1. Chronic hypoxemic respiratory failure: On home oxygen.  2. COPD: This diagnosis has been listed, but PFTs in 3/18 did not show significant obstruction.  3. Chronic diastolic CHF: With cor pulmonale.  - Echo (8/18): EF 65-70%, severe RV dilation with decreased RV systolic function, PASP 67 mmHg.  - Echo (11/18): EF 55%, PASP 51 mmHg, RV moderately dilated.  4. Type II diabetes 5. HTN 6. Hyperlipidemia 7. CKD: Stage 3.  8. CAD: LHC (2015) with 40%  pLAD, 50% mLAD.  9. Lung cancer: Treated in 2010 with chemotherapy and resection.  No recurrence.  10. 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   - V/Q scan negative for chronic PE 8/18.  - CTA chest (1/18): No PE, no emphysema, no ILD. - PFTs (3/18): moderate-severe restriction with minimal obstruction, severely decreased DLCO. FVC 63%, FEV1 72%, ratio 114%.  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  - Sleep study (2/19): No OSA, there is nocturnal hypoxemia.  - RHC (1/19): mean RA 7, PA 55/16 mean 34, mean PCWP 15, CI 2.34.   SH: son and daughter in [place], quit smoking in 2008.   Family History  Problem Relation Age of Onset  . Diabetes Father   . Heart failure Father   . Cancer Sister   . Diabetes Brother   . Heart failure Brother    ROS: All systems reviewed and negative except as per HPI.  Current Outpatient Medications  Medication Sig Dispense Refill  . acetaminophen (TYLENOL) 325 MG tablet Take 650 mg by mouth every 6 (six) hours as needed for moderate pain or fever.     [name] aspirin EC 81 MG tablet Take 81 mg by mouth daily.     . Carboxymethylcellulose Sod PF (REFRESH CELLUVISC) 1 % GEL Place 1 drop into both eyes 2 (two) times daily.    . Cholecalciferol (VITAMIN D) 2000 units CAPS Take 2,000 Units by mouth daily.    [name]  Fluticasone-Salmeterol (ADVAIR) 250-50 MCG/DOSE AEPB Inhale 1 puff into the lungs 2 (two) times daily.    . furosemide (LASIX) 40 MG tablet Take 1 tablet (40 mg total) by mouth 2 (two) times daily. 60 tablet 3  . gabapentin (NEURONTIN) 300 MG capsule Take 300 mg by mouth 3 (three) times daily.    [name] guaiFENesin (MUCINEX) 600 MG 12 hr tablet Take 1 tablet (600 mg total) by mouth 2 (two) times daily as needed for cough or to loosen phlegm. 20 tablet 0  . insulin aspart (NOVOLOG) 100 UNIT/ML injection Inject 4 Units into the skin 3 (three) times daily with meals. (Patient taking differently: Inject 6 Units into the skin 3 (three)  times daily with meals. Inject 6 units with each meal and add additional units per sliding scale: BGL 101-150 = 2 units; 151-200 = 3 units; 201-250 = 5 units; 251-300 = 7 units; 301-350 = 9 units; >350 = 11 units; CALL MD IF BGL IS >400 OR <60) 10 mL 0  . Insulin Detemir (LEVEMIR FLEXTOUCH) 100 UNIT/ML Pen Inject 36 Units into the skin at bedtime.     [name] ipratropium-albuterol (DUONEB) 0.5-2.5 (3) MG/3ML SOLN Take 3 mLs by nebulization every 4 (four) hours as needed (wheezing, Shortness of breath). 360 mL 0  . lisinopril (PRINIVIL,ZESTRIL) 10 MG tablet Take 0.5 tablets (5 mg total) by mouth daily. 30 tablet 3  . Macitentan (OPSUMIT) 10 MG TABS Take 1 tablet (10 mg total) by mouth daily. 30 tablet 11  . OXYGEN Inhale 3 L into the lungs continuous. FOR COPD    . potassium chloride SA (K-DUR,KLOR-CON) 20 MEQ tablet Take 40 meq (2 tabs) in AM, then 20 meq (1 tab) in PM 270 tablet 3  . sertraline (ZOLOFT) 50 MG tablet Take 150 mg by mouth daily.     [name] spironolactone (ALDACTONE) 25 MG tablet Take 0.5 tablets (12.5 mg total) by mouth daily. 15 tablet 3  . rosuvastatin (CRESTOR) 40 MG tablet Take 1 tablet (40 mg total) by mouth daily at 6 PM. (Patient not taking: Reported on 12/21/2017) 30 tablet 3  . traMADol (ULTRAM) 50 MG tablet Take 1 tablet (50 mg total) by mouth every 8 (eight) hours as needed (for pain). (Patient not taking: Reported on 12/21/2017) 10 tablet 0   No current facility-administered medications for this encounter.    Facility-Administered Medications Ordered in Other Encounters  Medication Dose Route Frequency Provider Last Rate Last Dose  . diatrizoate meglumine-sodium (GASTROGRAFIN) 66-10 % solution 30 mL  30 mL Oral PRN Bjorn Loser, MD   30 mL at 11/30/15 0820   BP (!) 96/58   Pulse 70   Wt 171 lb 12.8 oz (77.9 kg)   SpO2 96%   BMI 32.46 kg/m  General: NAD Neck: No JVD, no thyromegaly or thyroid nodule.  Lungs: Clear to auscultation bilaterally with normal respiratory  effort. CV: Nondisplaced PMI.  Heart regular S1/S2, no S3/S4, no murmur.  No peripheral edema.  No carotid bruit.  Normal pedal pulses.  Abdomen: Soft, nontender, no hepatosplenomegaly, no distention.  Skin: Intact without lesions or rashes.  Neurologic: Alert and oriented x 3.  Psych: Normal affect. Extremities: No clubbing or cyanosis.  HEENT: Normal.   Assessment/Plan: 1. 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  Restrictive PFTs but lung parenchyma looked relatively normal on 1/18 CTA chest.  Findings by PFTs and CT not consistent with emphysema. V/Q scan negative for chronic or acute PEs. Serologic workup was negative. Echo in 8/18  showed dilated and dysfunctional RV, similar in 11/18.  Sleep study showed nocturnal hypoxemia but not OSA.  RHC (1/19) showed moderate pulmonary arterial hypertension.  - Continue to wear home oxygen.  - She will need 6 minute walk next appointment.   - She has started on Opsumit. - Will add tadalafil 20 mg daily and titrate up at next appt if she tolerates.  - She has been referred to pulmonary rehab.  2. Chronic hypoxemic respiratory failure: ?Primarily due to pulmonary hypertension.  3. Chronic diastolic CHF: With prominent RV failure. Mild volume overload on exam.  - Continue Lasix 40 mg bid.  BMET today.   - Continue spironolactone to 12.5 mg daily.  4. CKD: Stage 3.  Follow BMET as above.  5. CAD: Nonobstructive in 2015.  No exertional chest pain.  LDL very high in 4/19 but was not on statin. I do not think that the statin caused her unilateral hip pain.  - Restart Crestor 40 mg daily.  She will need lipids/LFTs in 2 months.   Followup in 6 wks.   [name] 12/21/2017

## 2017-12-22 ENCOUNTER — Telehealth (HOSPITAL_COMMUNITY): Payer: Self-pay

## 2017-12-22 NOTE — Telephone Encounter (Signed)
Attempted to call patient in regards to Pulmonary Rehab - Vm has not been set up. Sending letter.

## 2018-01-11 ENCOUNTER — Encounter (HOSPITAL_COMMUNITY): Payer: Self-pay

## 2018-01-11 NOTE — Progress Notes (Signed)
Received prior auth from [place]. Was sent to wrong office and had wrong state. Faxed to [phone]

## 2018-01-22 ENCOUNTER — Ambulatory Visit (HOSPITAL_COMMUNITY): Payer: Medicare Other

## 2018-01-29 ENCOUNTER — Emergency Department (HOSPITAL_COMMUNITY): Payer: Medicare Other

## 2018-01-29 ENCOUNTER — Emergency Department (HOSPITAL_COMMUNITY)
Admission: EM | Admit: 2018-01-29 | Discharge: 2018-01-29 | Disposition: A | Discharge status: Home / Self Care | Payer: Medicare Other | Attending: Emergency Medicine | Admitting: Emergency Medicine

## 2018-01-29 ENCOUNTER — Encounter (HOSPITAL_COMMUNITY): Payer: Self-pay | Admitting: Internal Medicine

## 2018-01-29 DIAGNOSIS — E875 Hyperkalemia: Secondary | ICD-10-CM

## 2018-01-29 DIAGNOSIS — E1122 Type 2 diabetes mellitus with diabetic chronic kidney disease: Secondary | ICD-10-CM | POA: Diagnosis not present

## 2018-01-29 DIAGNOSIS — R61 Generalized hyperhidrosis: Secondary | ICD-10-CM | POA: Diagnosis not present

## 2018-01-29 DIAGNOSIS — I2510 Atherosclerotic heart disease of native coronary artery without angina pectoris: Secondary | ICD-10-CM | POA: Diagnosis not present

## 2018-01-29 DIAGNOSIS — N289 Disorder of kidney and ureter, unspecified: Secondary | ICD-10-CM

## 2018-01-29 DIAGNOSIS — I959 Hypotension, unspecified: Secondary | ICD-10-CM | POA: Diagnosis not present

## 2018-01-29 DIAGNOSIS — Z79899 Other long term (current) drug therapy: Secondary | ICD-10-CM | POA: Diagnosis not present

## 2018-01-29 DIAGNOSIS — Z794 Long term (current) use of insulin: Secondary | ICD-10-CM | POA: Diagnosis not present

## 2018-01-29 DIAGNOSIS — I130 Hypertensive heart and chronic kidney disease with heart failure and stage 1 through stage 4 chronic kidney disease, or unspecified chronic kidney disease: Secondary | ICD-10-CM | POA: Diagnosis not present

## 2018-01-29 DIAGNOSIS — Z85118 Personal history of other malignant neoplasm of bronchus and lung: Secondary | ICD-10-CM | POA: Diagnosis not present

## 2018-01-29 DIAGNOSIS — I5032 Chronic diastolic (congestive) heart failure: Secondary | ICD-10-CM | POA: Insufficient documentation

## 2018-01-29 DIAGNOSIS — R0902 Hypoxemia: Secondary | ICD-10-CM | POA: Diagnosis not present

## 2018-01-29 DIAGNOSIS — R0982 Postnasal drip: Secondary | ICD-10-CM | POA: Insufficient documentation

## 2018-01-29 DIAGNOSIS — Z7982 Long term (current) use of aspirin: Secondary | ICD-10-CM | POA: Diagnosis not present

## 2018-01-29 DIAGNOSIS — N183 Chronic kidney disease, stage 3 (moderate): Secondary | ICD-10-CM | POA: Insufficient documentation

## 2018-01-29 DIAGNOSIS — R000 Tachycardia, unspecified: Secondary | ICD-10-CM | POA: Diagnosis not present

## 2018-01-29 DIAGNOSIS — Z9981 Dependence on supplemental oxygen: Secondary | ICD-10-CM | POA: Diagnosis not present

## 2018-01-29 DIAGNOSIS — Z87891 Personal history of nicotine dependence: Secondary | ICD-10-CM | POA: Diagnosis not present

## 2018-01-29 DIAGNOSIS — N3000 Acute cystitis without hematuria: Secondary | ICD-10-CM | POA: Diagnosis not present

## 2018-01-29 DIAGNOSIS — R05 Cough: Secondary | ICD-10-CM | POA: Insufficient documentation

## 2018-01-29 DIAGNOSIS — R110 Nausea: Secondary | ICD-10-CM | POA: Diagnosis not present

## 2018-01-29 LAB — I-STAT CHEM 8, ED
BUN: 66 mg/dL — AB (ref 8–23)
Calcium, Ion: 1.17 mmol/L (ref 1.15–1.40)
Chloride: 104 mmol/L (ref 98–111)
Creatinine, Ser: 2.1 mg/dL — ABNORMAL HIGH (ref 0.44–1.00)
GLUCOSE: 173 mg/dL — AB (ref 70–99)
HCT: 25 % — ABNORMAL LOW (ref 36.0–46.0)
HEMOGLOBIN: 8.5 g/dL — AB (ref 12.0–15.0)
Potassium: 5.6 mmol/L — ABNORMAL HIGH (ref 3.5–5.1)
Sodium: 135 mmol/L (ref 135–145)
TCO2: 25 mmol/L (ref 22–32)

## 2018-01-29 LAB — CBC WITH DIFFERENTIAL/PLATELET
ABS IMMATURE GRANULOCYTES: 0 10*3/uL (ref 0.0–0.1)
BASOS PCT: 0 %
Basophils Absolute: 0 10*3/uL (ref 0.0–0.1)
EOS ABS: 0.4 10*3/uL (ref 0.0–0.7)
Eosinophils Relative: 5 %
HEMATOCRIT: 28.5 % — AB (ref 36.0–46.0)
Hemoglobin: 8.9 g/dL — ABNORMAL LOW (ref 12.0–15.0)
IMMATURE GRANULOCYTES: 1 %
LYMPHS ABS: 0.8 10*3/uL (ref 0.7–4.0)
Lymphocytes Relative: 10 %
MCH: 28.4 pg (ref 26.0–34.0)
MCHC: 31.2 g/dL (ref 30.0–36.0)
MCV: 91.1 fL (ref 78.0–100.0)
MONOS PCT: 8 %
Monocytes Absolute: 0.7 10*3/uL (ref 0.1–1.0)
NEUTROS ABS: 6.3 10*3/uL (ref 1.7–7.7)
Neutrophils Relative %: 76 %
PLATELETS: 155 10*3/uL (ref 150–400)
RBC: 3.13 MIL/uL — AB (ref 3.87–5.11)
RDW: 15.7 % — AB (ref 11.5–15.5)
WBC: 8.2 10*3/uL (ref 4.0–10.5)

## 2018-01-29 LAB — URINALYSIS, ROUTINE W REFLEX MICROSCOPIC
Bilirubin Urine: NEGATIVE
Glucose, UA: NEGATIVE mg/dL
Ketones, ur: NEGATIVE mg/dL
Nitrite: POSITIVE — AB
PH: 5 (ref 5.0–8.0)
Protein, ur: NEGATIVE mg/dL
SPECIFIC GRAVITY, URINE: 1.01 (ref 1.005–1.030)

## 2018-01-29 LAB — COMPREHENSIVE METABOLIC PANEL
ALT: 10 U/L (ref 0–44)
AST: 13 U/L — ABNORMAL LOW (ref 15–41)
Albumin: 3.3 g/dL — ABNORMAL LOW (ref 3.5–5.0)
Alkaline Phosphatase: 92 U/L (ref 38–126)
Anion gap: 7 (ref 5–15)
BUN: 60 mg/dL — ABNORMAL HIGH (ref 8–23)
CHLORIDE: 103 mmol/L (ref 98–111)
CO2: 26 mmol/L (ref 22–32)
CREATININE: 2.25 mg/dL — AB (ref 0.44–1.00)
Calcium: 9.1 mg/dL (ref 8.9–10.3)
GFR, EST AFRICAN AMERICAN: 24 mL/min — AB (ref >60)
GFR, EST NON AFRICAN AMERICAN: 21 mL/min — AB (ref >60)
Glucose, Bld: 199 mg/dL — ABNORMAL HIGH (ref 70–99)
Potassium: 5.7 mmol/L — ABNORMAL HIGH (ref 3.5–5.1)
SODIUM: 136 mmol/L (ref 135–145)
Total Bilirubin: 0.6 mg/dL (ref 0.3–1.2)
Total Protein: 6 g/dL — ABNORMAL LOW (ref 6.5–8.1)

## 2018-01-29 LAB — I-STAT TROPONIN, ED: Troponin i, poc: 0 ng/mL (ref 0.00–0.08)

## 2018-01-29 LAB — I-STAT CG4 LACTIC ACID, ED
Lactic Acid, Venous: 1.24 mmol/L (ref 0.5–1.9)
Lactic Acid, Venous: 1.05 mmol/L (ref 0.5–1.9)

## 2018-01-29 MED ORDER — SODIUM CHLORIDE 0.9 % IV BOLUS: 1000.0000 mL | Freq: Once | INTRAVENOUS | Status: DC

## 2018-01-29 MED ORDER — IPRATROPIUM-ALBUTEROL 0.5-2.5 (3) MG/3ML IN SOLN
3.0000 mL | Freq: Once | RESPIRATORY_TRACT | Status: AC
  Administered 2018-01-29: 3 mL via RESPIRATORY_TRACT
  Filled 2018-01-29: qty 3

## 2018-01-29 MED ORDER — SODIUM CHLORIDE 0.9 % IV BOLUS
500.0000 mL | Freq: Once | INTRAVENOUS | Status: AC
  Administered 2018-01-29: 500 mL via INTRAVENOUS

## 2018-01-29 MED ORDER — SODIUM CHLORIDE 0.9 % IV BOLUS
1000.0000 mL | Freq: Once | INTRAVENOUS | Status: AC
  Administered 2018-01-29: 1000 mL via INTRAVENOUS

## 2018-01-29 MED ORDER — CIPROFLOXACIN IN D5W 400 MG/200ML IV SOLN
400.0000 mg | Freq: Once | INTRAVENOUS | Status: AC
  Administered 2018-01-29: 400 mg via INTRAVENOUS
  Filled 2018-01-29: qty 200

## 2018-01-29 MED ORDER — CIPROFLOXACIN HCL 250 MG PO TABS: 250.0000 mg | ORAL_TABLET | Freq: Every day | ORAL | 0 refills | Status: DC

## 2018-01-29 NOTE — ED Notes (Signed)
Pt ambulated with unsteady gait. Assistance was needed.

## 2018-01-29 NOTE — ED Provider Notes (Addendum)
  Physical Exam  BP 106/62   Pulse 73   Temp 98.2 F (36.8 C) (Oral)   Resp 15   Ht 5' 1" (1.549 m)   Wt 79.8 kg (176 lb)   SpO2 98%   BMI 33.25 kg/m   Physical Exam  ED Course/Procedures     Procedures  MDM  Patient care assumed at 4 pm. Patient here with hypotension. Afebrile, asymptomatic but was noted at PCP office. Heart, lung, abdomen unremarkable. In particular, no pulsatile mass noted. Sign out pending labs, hydration, UA, reassessment   10:16 PM WBC nl, mild AKI with Cr 2.1. K 5.6 with no EKG changes. UA + leuk and nitrate. Previous urine culture sensitive to cephalosporins and cipro but she has anaphylaxis to amoxicillin. Given 1.5 L NS bolus, cipro. BP up to low 100s, which is baseline. Lactate nl, doesn't appear septic. Stable for dc home with cipro. Hypotension likely from dehydration vs UTI. She is slightly hyperkalemic and is on potassium supplementation and I told her to stop taking potassium for now.      Drenda Freeze, MD 01/29/18 2218    Drenda Freeze, MD 01/29/18 [phone]

## 2018-01-29 NOTE — ED Triage Notes (Signed)
Pt here from PCP with BP 70/40 for EMS. Last BP for EMS after 300 NS 90/40. Pt c/o dizziness upon standing and fatigue for the last 2 days. Hx of CHF, COPD, diabetes.

## 2018-01-29 NOTE — Discharge Instructions (Addendum)
Take cipro daily for 5 days for UTI.   Stay hydrated.   Your kidney function and potassium is slightly abnormal. Recheck kidney function with your doctor next week. Stop taking your potassium pills.   Return to ER if you have fever, vomiting, passing out, chest pain, trouble breathing

## 2018-01-29 NOTE — ED Notes (Signed)
Patient unable to complete orthostatic vitals due to "knees giving out".

## 2018-01-29 NOTE — ED Provider Notes (Signed)
[facility] EMERGENCY DEPARTMENT Provider Note   CSN: [id number] Arrival date & time: 01/29/18  1401     History   Chief Complaint Chief Complaint  Patient presents with  . Hypotension    HPI Amy Shepard is a 72 y.o. female.  72 year old female with extensive past medical history including CHF, CKD, chronic respiratory failure on 3 L home oxygen, pulmonary hypertension, CAD, type 2 diabetes mellitus who presents with hypotension.  Patient states that recently she has been having some night sweats with no associated fevers or chills.  She went to her PCP for this and some occasional lightheadedness.  When she got to the office, she was noted to be hypotensive and transported here by EMS.  Currently, she denies any complaints of pain including no chest pain.  She has had no new shortness of breath, on her usual 3 L of oxygen.  She has a chronic cough related to postnasal drip but denies any worsening cough recently.  No urinary symptoms, abdominal pain, vomiting, diarrhea, blood in her stool, black stools, significant changes in weight, or recent medication changes.  She states she is compliant with her medications.  She denies any orthopnea or paroxysmal nocturnal dyspnea.  The history is provided by the patient.    Past Medical History:  Diagnosis Date  . Arthritis   . CAD (coronary artery disease)    a. Prior cath 2015 showed 40% prox AD, 50-50% mLAD, otherwise calcification but no obstruction in LCx/RCA.. Medical management recommended. b. 2017: low-risk NST.  [name] Chronic diastolic CHF (congestive heart failure) ([place])   . Chronic respiratory failure ([place])   . CKD (chronic kidney disease), stage III ([place])   . COPD (chronic obstructive pulmonary disease) ([place])   . Cor pulmonale ([name])    a. felt due to advanced COPD and noncompliance with O2.  . Depression   . Diabetes mellitus    [name]    dx  2008  . Fracture    left foot  . Hx of seasonal allergies    . Hypercholesteremia   . Hypertension    on no medications  . Lung cancer ([place])   . On home oxygen therapy    "2.5L; 24/7" (03/10/2017)  . Pericardial effusion    a. small-moderate in 07/2016.  [name] UTI (urinary tract infection)     Patient Active Problem List   Diagnosis Date Noted  . Lisfranc dislocation, left, subsequent encounter 06/08/2017  . Chronic respiratory failure with hypoxia ([place]) 06/03/2017  . Falls 06/03/2017  . Hypokalemia 06/03/2017  . Sepsis secondary to UTI ([place]) 06/02/2017  . Dog bite 05/02/2017  . CHF exacerbation ([place]) 05/02/2017  . Acute diastolic CHF (congestive heart failure) ([place]) 05/02/2017  . Chronic diastolic CHF (congestive heart failure) ([place]) 04/02/2017  . Cor pulmonale ([place]) 04/02/2017  . Acute on chronic respiratory failure ([place]) 03/10/2017  . CKD (chronic kidney disease), stage III ([place]) 03/10/2017  . Chronic pain 03/10/2017  . Lactic acidosis 02/10/2017  . Acute respiratory failure with hypoxia ([place]) 02/09/2017  . Acute on chronic diastolic CHF (congestive heart failure) ([place]) 06/23/2016  . HTN (hypertension) 06/23/2016  . Type 2 diabetes mellitus with complication, with long-term current use of insulin ([place]) 03/11/2016  . Depression 03/11/2016  . Chronic obstructive pulmonary disease ([place]) 03/10/2016  . S/P lumbar spinal fusion 02/22/2016  . Coronary artery disease involving native coronary artery 07/12/2014  . Pain in the chest   . Malignant neoplasm of lower lobe of right  lung ([place]) 05/25/2014  . Lung cancer ([place]) 09/01/2012    Past Surgical History:  Procedure Laterality Date  . [facility] or [facility] in [place]     bilateral cataracts  . LEFT HEART CATHETERIZATION WITH CORONARY ANGIOGRAM N/A 07/12/2014   Procedure: LEFT HEART CATHETERIZATION WITH CORONARY ANGIOGRAM;  Surgeon: [name], MD;  Location: [facility] CATH LAB;  Service: Cardiovascular;  Laterality: N/A;  . MAXIMUM ACCESS  (MAS)POSTERIOR LUMBAR INTERBODY FUSION (PLIF) 2 LEVEL N/A 02/22/2016   Procedure: Lumbar three-four - Lumbar four-five  MAXIMUM ACCESS SURGERY  POSTERIOR LUMBAR INTERBODY FUSION;  Surgeon: [name], MD;  Location: [facility] NEURO ORS;  Service: Neurosurgery;  Laterality: N/A;  . ORIF TOE FRACTURE Left 06/12/2017   Procedure: OPEN REDUCTION INTERNAL FIXATION (ORIF) BASE 1ST METATARSAL (TOE) FRACTURE;  Surgeon: Newt Minion, MD;  Location: [facility];  Service: Orthopedics;  Laterality: Left;  . RIGHT HEART CATH N/A 08/06/2017   Procedure: RIGHT HEART CATH;  Surgeon: Larey Dresser, MD;  Location: [facility] CV LAB;  Service: Cardiovascular;  Laterality: N/A;  . THORACOTOMY Right 2010   lower  . TONSILLECTOMY       OB History   None      Home Medications    Prior to Admission medications   Medication Sig Start Date End Date Taking? Authorizing Provider  acetaminophen (TYLENOL) 325 MG tablet Take 650 mg by mouth every 6 (six) hours as needed for moderate pain or fever.    Yes [provider]  aspirin EC 81 MG tablet Take 81 mg by mouth daily.    Yes [provider]  Carboxymethylcellulose Sod PF (REFRESH CELLUVISC) 1 % GEL Place 1 drop into both eyes 2 (two) times daily.   Yes [provider]  Cholecalciferol (VITAMIN D) 2000 units CAPS Take 2,000 Units by mouth daily.   Yes [provider]  Fluticasone-Salmeterol (ADVAIR) 250-50 MCG/DOSE AEPB Inhale 1 puff into the lungs 2 (two) times daily.   Yes [provider]  furosemide (LASIX) 40 MG tablet Take 1 tablet (40 mg total) by mouth 2 (two) times daily. 10/23/17  Yes Larey Dresser, MD  gabapentin (NEURONTIN) 300 MG capsule Take 300 mg by mouth 3 (three) times daily.   Yes [provider]  guaiFENesin (MUCINEX) 600 MG 12 hr tablet Take 1 tablet (600 mg total) by mouth 2 (two) times daily as needed for cough or to loosen phlegm. 05/07/17  Yes Aline August, MD  Insulin Detemir (LEVEMIR  FLEXTOUCH) 100 UNIT/ML Pen Inject 36 Units into the skin at bedtime.    Yes [provider]  insulin lispro (HUMALOG) 100 UNIT/ML injection Inject 2-11 Units into the skin See admin instructions. Inject 6 units with each meal and add additional units per sliding scale: BGL 101-150 = 2 units; 151-200 = 3 units; 201-250 = 5 units; 251-300 = 7 units; 301-350 = 9 units; >350 = 11 units; CALL MD IF BGL IS >400 OR <60   Yes [provider]  ipratropium-albuterol (DUONEB) 0.5-2.5 (3) MG/3ML SOLN Take 3 mLs by nebulization every 4 (four) hours as needed (wheezing, Shortness of breath). 03/13/16  Yes Johnson, Clanford L, MD  lisinopril (PRINIVIL,ZESTRIL) 10 MG tablet Take 0.5 tablets (5 mg total) by mouth daily. 12/21/17  Yes Larey Dresser, MD  Macitentan (OPSUMIT) 10 MG TABS Take 1 tablet (10 mg total) by mouth daily. 08/17/17  Yes  Larey Dresser, MD  OXYGEN Inhale 3 L into the lungs continuous. FOR COPD   Yes [provider]  potassium chloride SA (K-DUR,KLOR-CON) 20 MEQ tablet Take 40 meq (2 tabs) in AM, then 20 meq (1 tab) in PM 10/27/17  Yes Larey Dresser, MD  sertraline (ZOLOFT) 50 MG tablet Take 150 mg by mouth daily.    Yes [provider]  spironolactone (ALDACTONE) 25 MG tablet Take 0.5 tablets (12.5 mg total) by mouth daily. 10/23/17 01/29/18 Yes Larey Dresser, MD  TRULICITY 9.98 PJ/8.2NK SOPN Inject 0.5 mLs as directed once a week. Sundays 01/28/18  Yes [provider]  insulin aspart (NOVOLOG) 100 UNIT/ML injection Inject 4 Units into the skin 3 (three) times daily with meals. Patient not taking: Reported on 01/29/2018 05/07/17   Aline August, MD  rosuvastatin (CRESTOR) 40 MG tablet Take 1 tablet (40 mg total) by mouth daily at 6 PM. Patient not taking: Reported on 12/21/2017 10/23/17   Larey Dresser, MD  traMADol (ULTRAM) 50 MG tablet Take 1 tablet (50 mg total) by mouth every 8 (eight) hours as needed (for pain). Patient not taking: Reported on  12/21/2017 06/06/17   Edwin Dada, MD    Family History Family History  Problem Relation Age of Onset  . Diabetes Father   . Heart failure Father   . Cancer Sister   . Diabetes Brother   . Heart failure Brother     Social History Social History   Tobacco Use  . Smoking status: Former Smoker    Packs/day: 1.00    Years: 30.00    Pack years: 30.00    Last attempt to quit: 07/14/2006    Years since quitting: 11.5  . Smokeless tobacco: Never Used  Substance Use Topics  . Alcohol use: No  . Drug use: No     Allergies   Amoxicillin   Review of Systems Review of Systems All other systems reviewed and are negative except that which was mentioned in HPI   Physical Exam Updated Vital Signs BP (!) 106/53   Pulse 80   Temp 98.2 F (36.8 C) (Oral)   Resp 15   Ht _0  (1.549 m)   Wt 79.8 kg (176 lb)   SpO2 96%   BMI 33.25 kg/m   Physical Exam  Constitutional: She is oriented to person, place, and time. She appears well-developed and well-nourished. No distress.  HENT:  Head: Normocephalic and atraumatic.  Moist mucous membranes  Eyes: Conjunctivae are normal.  Neck: Neck supple.  Cardiovascular: Normal rate, regular rhythm and normal heart sounds.  No murmur heard. Pulmonary/Chest: Effort normal. She has wheezes (expiratory wheezes b/l).  Abdominal: Soft. Bowel sounds are normal. She exhibits no distension. There is no tenderness.  Musculoskeletal: She exhibits no edema.  Neurological: She is alert and oriented to person, place, and time.  Fluent speech  Skin: Skin is warm and dry.  Psychiatric: She has a normal mood and affect. Judgment normal.  Nursing note and vitals reviewed.    ED Treatments / Results  Labs (all labs ordered are listed, but only abnormal results are displayed) Labs Reviewed  CBC WITH DIFFERENTIAL/PLATELET - Abnormal; Notable for the following components:      Result Value   RBC 3.13 (*)    Hemoglobin 8.9 (*)    HCT 28.5  (*)    RDW 15.7 (*)    All other components within normal limits  COMPREHENSIVE METABOLIC PANEL  URINALYSIS, ROUTINE  W REFLEX MICROSCOPIC  I-STAT CG4 LACTIC ACID, ED  I-STAT TROPONIN, ED  I-STAT CG4 LACTIC ACID, ED    EKG EKG Interpretation  Date/Time:  Friday January 29 2018 14:24:21 EDT Ventricular Rate:  79 PR Interval:    QRS Duration: 106 QT Interval:  410 QTC Calculation: 470 R Axis:   -80 Text Interpretation:  Sinus rhythm Left anterior fascicular block Low voltage, precordial leads Consider anterior infarct Baseline wander in lead(s) V2 similar to previous Confirmed by Theotis Burrow [phone]) on 01/29/2018 2:47:45 PM   Radiology Dg Chest Port 1 View  Result Date: 01/29/2018 CLINICAL DATA:  Hypotension. EXAM: PORTABLE CHEST 1 VIEW COMPARISON:  CT 07/20/2017. Chest x-ray 06/04/2017, 05/02/2017, 06/23/2016. FINDINGS: Mediastinum and hilar structures normal. Stable cardiomegaly. Surgical sutures noted over the right mid lung. Chronic interstitial changes are noted. No definite basilar infiltrate noted as noted on prior CT of 07/20/2017. No pleural effusion or pneumothorax. No acute bony abnormality. IMPRESSION: 1. Postsurgical changes right lung. Chronic interstitial lung disease, chest is stable from multiple prior exams. No acute abnormality identified. 2. Stable cardiomegaly. Electronically Signed   By: Marcello Moores  Register   On: 01/29/2018 15:08    Procedures .Critical Care Performed by: Sharlett Iles, MD Authorized by: Sharlett Iles, MD   Critical care provider statement:    Critical care time (minutes):  30   Critical care time was exclusive of:  Separately billable procedures and treating other patients   Critical care was necessary to treat or prevent imminent or life-threatening deterioration of the following conditions: hypotension.   Critical care was time spent personally by me on the following activities:  Development of treatment plan with patient or  surrogate, evaluation of patient's response to treatment, examination of patient, obtaining history from patient or surrogate, ordering and performing treatments and interventions, ordering and review of laboratory studies, ordering and review of radiographic studies and re-evaluation of patient's condition   (including critical care time)  Medications Ordered in ED Medications  ipratropium-albuterol (DUONEB) 0.5-2.5 (3) MG/3ML nebulizer solution 3 mL (3 mLs Nebulization Given 01/29/18 1436)  sodium chloride 0.9 % bolus 500 mL (0 mLs Intravenous Stopped 01/29/18 1607)     Initial Impression / Assessment and Plan / ED Course  I have reviewed the triage vital signs and the nursing notes.  Pertinent labs & imaging results that were available during my care of the patient were reviewed by me and considered in my medical decision making (see chart for details).     Patient was well-appearing, pleasant, and without complaints on arrival.  She was hypotensive into the 94W systolic.  She had received 500 mL's by EMS.  Gave the patient gentle fluid bolus of 500 additional mL's given her history of heart failure.  Chest x-ray negative acute.  Her blood pressure has improved to 106/53 after second fluid bolus.  I am signing out to the oncoming provider, Dr. Darl Householder, with labwork pending.  She has no signs/sx of infection to suggest sepsis as cause for her hypotension and she has had no complaints of GI bleed.  She is also had no significant changes to her home diuretics.  Her dispo is pending lab work results and reassessment. Final Clinical Impressions(s) / ED Diagnoses   Final diagnoses:  None    ED Discharge Orders    None       Little, Wenda Overland, MD 01/29/18 1627

## 2018-01-31 LAB — URINE CULTURE: Culture: >=100000 — AB

## 2018-02-01 ENCOUNTER — Telehealth: Payer: Self-pay | Admitting: Emergency Medicine

## 2018-02-01 NOTE — Telephone Encounter (Signed)
Post ED Visit - Positive Culture Follow-up  Culture report reviewed by antimicrobial stewardship pharmacist:  _0  Elenor Quinones, Pharm.D. _1  Heide Guile, Pharm.D., BCPS AQ-ID _2  Parks Neptune, Pharm.D., BCPS _3  Alycia Rossetti, Pharm.D., BCPS _4  [place], [place].D., BCPS, AAHIVP _5  Legrand Como, Pharm.D., BCPS, AAHIVP _6  Salome Arnt, PharmD, BCPS _7  Johnnette Gourd, PharmD, BCPS _8  Hughes Better, PharmD, BCPS _9  Leeroy Cha, PharmD  Positive urine culture Treated with ciprofloxacin, organism sensitive to the same and no further patient follow-up is required at this time.  Hazle Nordmann 02/01/2018, 11:53 AM

## 2018-02-02 ENCOUNTER — Telehealth (HOSPITAL_COMMUNITY): Payer: Self-pay

## 2018-02-02 NOTE — Telephone Encounter (Signed)
Attempted to call patient to reschedule for Pulmonary Rehab. Voicemail has not been set up yet.   Closed referral

## 2018-02-03 ENCOUNTER — Telehealth (HOSPITAL_COMMUNITY): Payer: Self-pay | Admitting: *Deleted

## 2018-02-03 NOTE — Telephone Encounter (Signed)
Pt approved for [facility] through 02/03/19 for pulm htn meds, she has $5300.

## 2018-02-23 ENCOUNTER — Other Ambulatory Visit (HOSPITAL_COMMUNITY): Payer: Self-pay | Admitting: Cardiology

## 2018-02-23 DIAGNOSIS — I2781 Cor pulmonale (chronic): Secondary | ICD-10-CM

## 2018-02-23 DIAGNOSIS — I270 Primary pulmonary hypertension: Secondary | ICD-10-CM

## 2018-02-23 DIAGNOSIS — I5023 Acute on chronic systolic (congestive) heart failure: Secondary | ICD-10-CM

## 2018-02-26 ENCOUNTER — Encounter (HOSPITAL_COMMUNITY): Payer: Medicare Other | Admitting: Cardiology

## 2018-03-04 ENCOUNTER — Telehealth (HOSPITAL_COMMUNITY): Payer: Self-pay | Admitting: *Deleted

## 2018-03-04 NOTE — Telephone Encounter (Signed)
Called pt no answer no VM set up. Will try again later.

## 2018-03-04 NOTE — Telephone Encounter (Signed)
If she is taking the 40 mg dose, decrease to 20 mg daily.  If she is taking 20 mg daily, try for 2-3 more days and if symptoms persist, stop it.

## 2018-03-04 NOTE — Telephone Encounter (Signed)
Pt called complaining of night sweats and feeling light headed throughout the day. Pt said this has been going on for about 2 weeks shortly after she started [place]. Pt called her pharmacy and was told these are possible side effects of Adcirca but to try to stay on the medication and see if it gets better. She states she does not feel any better. Informed her Dr.McLean was out of the office until Monday but I would call her back once I spoke with him.  Message routed to [place]

## 2018-03-05 ENCOUNTER — Encounter (HOSPITAL_COMMUNITY): Payer: Self-pay | Admitting: Emergency Medicine

## 2018-03-05 ENCOUNTER — Emergency Department (HOSPITAL_COMMUNITY)
Admission: EM | Admit: 2018-03-05 | Discharge: 2018-03-06 | Disposition: A | Discharge status: Home / Self Care | Payer: Medicare Other | Attending: Emergency Medicine | Admitting: Emergency Medicine

## 2018-03-05 ENCOUNTER — Other Ambulatory Visit: Payer: Self-pay

## 2018-03-05 DIAGNOSIS — Z79899 Other long term (current) drug therapy: Secondary | ICD-10-CM | POA: Diagnosis not present

## 2018-03-05 DIAGNOSIS — I130 Hypertensive heart and chronic kidney disease with heart failure and stage 1 through stage 4 chronic kidney disease, or unspecified chronic kidney disease: Secondary | ICD-10-CM | POA: Insufficient documentation

## 2018-03-05 DIAGNOSIS — Z87891 Personal history of nicotine dependence: Secondary | ICD-10-CM | POA: Insufficient documentation

## 2018-03-05 DIAGNOSIS — Z7982 Long term (current) use of aspirin: Secondary | ICD-10-CM | POA: Insufficient documentation

## 2018-03-05 DIAGNOSIS — N183 Chronic kidney disease, stage 3 (moderate): Secondary | ICD-10-CM | POA: Diagnosis not present

## 2018-03-05 DIAGNOSIS — Z9981 Dependence on supplemental oxygen: Secondary | ICD-10-CM

## 2018-03-05 DIAGNOSIS — I5032 Chronic diastolic (congestive) heart failure: Secondary | ICD-10-CM | POA: Insufficient documentation

## 2018-03-05 DIAGNOSIS — J9610 Chronic respiratory failure, unspecified whether with hypoxia or hypercapnia: Secondary | ICD-10-CM | POA: Insufficient documentation

## 2018-03-05 DIAGNOSIS — J9611 Chronic respiratory failure with hypoxia: Secondary | ICD-10-CM

## 2018-03-05 NOTE — ED Triage Notes (Addendum)
Pt BIB [facility], states her power is out and she is out of O2 tanks at home. Pt attempted to call advanced with no reply back. Pt has no complaints at this time.

## 2018-03-06 DIAGNOSIS — M2550 Pain in unspecified joint: Secondary | ICD-10-CM | POA: Diagnosis not present

## 2018-03-06 DIAGNOSIS — Z7401 Bed confinement status: Secondary | ICD-10-CM | POA: Diagnosis not present

## 2018-03-06 NOTE — ED Notes (Signed)
ED Provider at bedside. 

## 2018-03-06 NOTE — ED Notes (Addendum)
Pt was in waiting area and was sitting in chair and stood up and we looked up and her chair had flipped backward and pt and pt was lying on the ground. Pt stated she did not hit her head. She is alert oriented x 4.

## 2018-03-06 NOTE — ED Provider Notes (Signed)
[facility]MENT Provider Note   CSN: [id number] Arrival date & time: 03/05/18  2259     History   Chief Complaint Chief Complaint  Patient presents with  . power out, no O2    HPI Amy Shepard is a 72 y.o. female.  Patient with history of pulmonary fibrosis on home oxygen presenting because her power was out at home and she could not use her oxygen concentrator.  Reports her O2 tanks are out.  She denies any symptoms.  States her breathing is at baseline.  No cough, chest pain, fever.  No leg pain or leg swelling.  Good p.o. intake and urine output. Since arrival in the ED, she is confirmed that her power is back on at home she is requesting to go home.  She states she has oxygen concentrator at home but no tanks.  The history is provided by the patient.    Past Medical History:  Diagnosis Date  . Arthritis   . CAD (coronary artery disease)    a. Prior cath 2015 showed 40% prox AD, 50-50% mLAD, otherwise calcification but no obstruction in LCx/RCA.. Medical management recommended. b. 2017: low-risk NST.  [name] Chronic diastolic CHF (congestive heart failure) ([place])   . Chronic respiratory failure ([place])   . CKD (chronic kidney disease), stage III ([place])   . COPD (chronic obstructive pulmonary disease) ([place])   . Cor pulmonale ([name])    a. felt due to advanced COPD and noncompliance with O2.  . Depression   . Diabetes mellitus    [name]    dx  2008  . Fracture    left foot  . Hx of seasonal allergies   . Hypercholesteremia   . Hypertension    on no medications  . Lung cancer ([place])   . On home oxygen therapy    "2.5L; 24/7" (03/10/2017)  . Pericardial effusion    a. small-moderate in 07/2016.  [name] UTI (urinary tract infection)     Patient Active Problem List   Diagnosis Date Noted  . Lisfranc dislocation, left, subsequent encounter 06/08/2017  . Chronic respiratory failure with hypoxia ([place]) 06/03/2017  . Falls 06/03/2017  .  Hypokalemia 06/03/2017  . Sepsis secondary to UTI ([place]) 06/02/2017  . Dog bite 05/02/2017  . CHF exacerbation ([place]) 05/02/2017  . Acute diastolic CHF (congestive heart failure) ([place]) 05/02/2017  . Chronic diastolic CHF (congestive heart failure) ([place]) 04/02/2017  . Cor pulmonale ([place]) 04/02/2017  . Acute on chronic respiratory failure ([place]) 03/10/2017  . CKD (chronic kidney disease), stage III ([place]) 03/10/2017  . Chronic pain 03/10/2017  . Lactic acidosis 02/10/2017  . Acute respiratory failure with hypoxia ([place]) 02/09/2017  . Acute on chronic diastolic CHF (congestive heart failure) ([place]) 06/23/2016  . HTN (hypertension) 06/23/2016  . Type 2 diabetes mellitus with complication, with long-term current use of insulin ([place]) 03/11/2016  . Depression 03/11/2016  . Chronic obstructive pulmonary disease ([place]) 03/10/2016  . S/P lumbar spinal fusion 02/22/2016  . Coronary artery disease involving native coronary artery 07/12/2014  . Pain in the chest   . Malignant neoplasm of lower lobe of right lung ([place]) 05/25/2014  . Lung cancer ([place]) 09/01/2012    Past Surgical History:  Procedure Laterality Date  . [place] or [facility] in [place]     bilateral cataracts  . LEFT HEART CATHETERIZATION WITH CORONARY ANGIOGRAM N/A 07/12/2014   Procedure: LEFT  HEART CATHETERIZATION WITH CORONARY ANGIOGRAM;  Surgeon: [name], MD;  Location[facility] CATH LAB;  Service: Cardiovascular;  Laterality: N/A;  . MAXIMUM ACCESS (MAS)POSTERIOR LUMBAR INTERBODY FUSION (PLIF) 2 LEVEL N/A 02/22/2016   Procedure: Lumbar three-four - Lumbar four-five  MAXIMUM ACCESS SURGERY  POSTERIOR LUMBAR INTERBODY FUSION;  Surgeon: [name], MD;  Location: [facility] NEURO ORS;  Service: Neurosurgery;  Laterality: N/A;  . ORIF TOE FRACTURE Left 06/12/2017   Procedure: OPEN REDUCTION INTERNAL FIXATION (ORIF) BASE 1ST METATARSAL (TOE) FRACTURE;  Surgeon: Newt Minion, MD;  Location:  [facility];  Service: Orthopedics;  Laterality: Left;  . RIGHT HEART CATH N/A 08/06/2017   Procedure: RIGHT HEART CATH;  Surgeon: Larey Dresser, MD;  Location: [facility] CV LAB;  Service: Cardiovascular;  Laterality: N/A;  . THORACOTOMY Right 2010   lower  . TONSILLECTOMY       OB History   None      Home Medications    Prior to Admission medications   Medication Sig Start Date End Date Taking? Authorizing Provider  acetaminophen (TYLENOL) 325 MG tablet Take 650 mg by mouth every 6 (six) hours as needed for moderate pain or fever.     [provider]  aspirin EC 81 MG tablet Take 81 mg by mouth daily.     [provider]  Carboxymethylcellulose Sod PF (REFRESH CELLUVISC) 1 % GEL Place 1 drop into both eyes 2 (two) times daily.    [provider]  Cholecalciferol (VITAMIN D) 2000 units CAPS Take 2,000 Units by mouth daily.    [provider]  ciprofloxacin (CIPRO) 250 MG tablet Take 1 tablet (250 mg total) by mouth daily. 01/29/18   Drenda Freeze, MD  Fluticasone-Salmeterol (ADVAIR) 250-50 MCG/DOSE AEPB Inhale 1 puff into the lungs 2 (two) times daily.    [provider]  furosemide (LASIX) 40 MG tablet TAKE 1 TABLET BY MOUTH TWICE A DAY 02/23/18   Larey Dresser, MD  gabapentin (NEURONTIN) 300 MG capsule Take 300 mg by mouth 3 (three) times daily.    [provider]  guaiFENesin (MUCINEX) 600 MG 12 hr tablet Take 1 tablet (600 mg total) by mouth 2 (two) times daily as needed for cough or to loosen phlegm. 05/07/17   Aline August, MD  insulin aspart (NOVOLOG) 100 UNIT/ML injection Inject 4 Units into the skin 3 (three) times daily with meals. Patient not taking: Reported on 01/29/2018 05/07/17   Aline August, MD  Insulin Detemir (LEVEMIR FLEXTOUCH) 100 UNIT/ML Pen Inject 36 Units into the skin at bedtime.     [provider]  insulin lispro (HUMALOG) 100 UNIT/ML injection Inject 2-11 Units into the skin See admin  instructions. Inject 6 units with each meal and add additional units per sliding scale: BGL 101-150 = 2 units; 151-200 = 3 units; 201-250 = 5 units; 251-300 = 7 units; 301-350 = 9 units; >350 = 11 units; CALL MD IF BGL IS >400 OR <60    [provider]  ipratropium-albuterol (DUONEB) 0.5-2.5 (3) MG/3ML SOLN Take 3 mLs by nebulization every 4 (four) hours as needed (wheezing, Shortness of breath). 03/13/16   Johnson, Clanford L, MD  lisinopril (PRINIVIL,ZESTRIL) 10 MG tablet Take 0.5 tablets (5 mg total) by mouth daily. 12/21/17   Larey Dresser, MD  Macitentan (OPSUMIT) 10 MG TABS Take 1 tablet (10 mg total) by mouth daily. 08/17/17   Larey Dresser, MD  OXYGEN Inhale 3 L into the  lungs continuous. FOR COPD    [provider]  potassium chloride SA (K-DUR,KLOR-CON) 20 MEQ tablet Take 40 meq (2 tabs) in AM, then 20 meq (1 tab) in PM 10/27/17   Larey Dresser, MD  rosuvastatin (CRESTOR) 40 MG tablet Take 1 tablet (40 mg total) by mouth daily at 6 PM. Patient not taking: Reported on 12/21/2017 10/23/17   Larey Dresser, MD  sertraline (ZOLOFT) 50 MG tablet Take 150 mg by mouth daily.     [provider]  spironolactone (ALDACTONE) 25 MG tablet TAKE 1/2 TABLET ONCE A DAY 02/23/18   Larey Dresser, MD  traMADol (ULTRAM) 50 MG tablet Take 1 tablet (50 mg total) by mouth every 8 (eight) hours as needed (for pain). Patient not taking: Reported on 12/21/2017 06/06/17   Edwin Dada, MD  TRULICITY 9.56 LO/7.5IE SOPN Inject 0.5 mLs as directed once a week. Sundays 01/28/18   [provider]    Family History Family History  Problem Relation Age of Onset  . Diabetes Father   . Heart failure Father   . Cancer Sister   . Diabetes Brother   . Heart failure Brother     Social History Social History   Tobacco Use  . Smoking status: Former Smoker    Packs/day: 1.00    Years: 30.00    Pack years: 30.00    Last attempt to quit: 07/14/2006    Years since  quitting: 11.6  . Smokeless tobacco: Never Used  Substance Use Topics  . Alcohol use: No  . Drug use: No     Allergies   Amoxicillin   Review of Systems Review of Systems  Constitutional: Negative for activity change, appetite change and fever.  HENT: Negative for congestion and rhinorrhea.   Eyes: Negative for visual disturbance.  Respiratory: Negative for cough, chest tightness and shortness of breath.   Cardiovascular: Negative for chest pain.  Gastrointestinal: Negative for abdominal pain, nausea and vomiting.  Genitourinary: Negative for dysuria, hematuria, vaginal bleeding and vaginal discharge.  Musculoskeletal: Negative for arthralgias and myalgias.  Skin: Negative for rash.  Neurological: Negative for dizziness, weakness, light-headedness and numbness.    all other systems are negative except as noted in the HPI and PMH.    Physical Exam Updated Vital Signs BP (!) 122/46 (BP Location: Right Arm)   Pulse 75   Temp 98.1 F (36.7 C) (Oral)   Resp 18   SpO2 90%   Physical Exam  Constitutional: She is oriented to person, place, and time. She appears well-developed and well-nourished. No distress.  Speaking full sentences, no distress  HENT:  Head: Normocephalic and atraumatic.  Mouth/Throat: Oropharynx is clear and moist. No oropharyngeal exudate.  Eyes: Pupils are equal, round, and reactive to light. Conjunctivae and EOM are normal.  Neck: Normal range of motion. Neck supple.  No meningismus.  Cardiovascular: Normal rate, regular rhythm, normal heart sounds and intact distal pulses.  No murmur heard. Pulmonary/Chest: Effort normal and breath sounds normal. No respiratory distress. She has no wheezes. She exhibits no tenderness.  Coarse breath sounds bilaterally  Abdominal: Soft. There is no tenderness. There is no rebound and no guarding.  Musculoskeletal: Normal range of motion. She exhibits no edema or tenderness.  Neurological: She is alert and oriented to  person, place, and time. No cranial nerve deficit. She exhibits normal muscle tone. Coordination normal.   5/5 strength throughout. CN 2-12 intact.Equal grip strength.   Skin: Skin is warm. Capillary refill  takes less than 2 seconds. No rash noted.  Psychiatric: She has a normal mood and affect. Her behavior is normal.  Nursing note and vitals reviewed.    ED Treatments / Results  Labs (all labs ordered are listed, but only abnormal results are displayed) Labs Reviewed - No data to display  EKG None  Radiology No results found.  Procedures Procedures (including critical care time)  Medications Ordered in ED Medications - No data to display   Initial Impression / Assessment and Plan / ED Course  I have reviewed the triage vital signs and the nursing notes.  Pertinent labs & imaging results that were available during my care of the patient were reviewed by me and considered in my medical decision making (see chart for details).    Patient with pulmonary fibrosis on home oxygen presenting because of power failure at home.  She has no other symptoms.  Patient denies any chest pain or shortness of breath.  Feels her breathing is at baseline.  She has since confirmed that her power is back on at home and is requesting to go home. She has oxygen concentrator at home and [facility] She has no chest pain or shortness of breath.  Follow-up with PCP.  Return precautions discussed. Final Clinical Impressions(s) / ED Diagnoses   Final diagnoses:  Chronic respiratory failure with hypoxia, on home oxygen therapy [facility])    ED Discharge Orders    None       Ezequiel Essex, MD 03/06/18 [phone]

## 2018-03-06 NOTE — Discharge Instructions (Addendum)
Use your oxygen as prescribed and follow-up with your primary doctor.  Return to the ED if you develop chest pain, shortness of breath or any other concerns.

## 2018-03-09 NOTE — Telephone Encounter (Signed)
Called pt again no VM set up.

## 2018-03-11 ENCOUNTER — Emergency Department (HOSPITAL_COMMUNITY): Payer: Medicare Other

## 2018-03-11 ENCOUNTER — Other Ambulatory Visit: Payer: Self-pay

## 2018-03-11 ENCOUNTER — Encounter (HOSPITAL_COMMUNITY): Payer: Self-pay

## 2018-03-11 ENCOUNTER — Observation Stay (HOSPITAL_COMMUNITY)
Admission: EM | Admit: 2018-03-11 | Discharge: 2018-03-13 | Disposition: A | Discharge status: Home / Self Care | Payer: Medicare Other | Attending: Internal Medicine | Admitting: Internal Medicine

## 2018-03-11 DIAGNOSIS — Z7982 Long term (current) use of aspirin: Secondary | ICD-10-CM | POA: Insufficient documentation

## 2018-03-11 DIAGNOSIS — Z6833 Body mass index (BMI) 33.0-33.9, adult: Secondary | ICD-10-CM | POA: Diagnosis not present

## 2018-03-11 DIAGNOSIS — D649 Anemia, unspecified: Secondary | ICD-10-CM | POA: Diagnosis present

## 2018-03-11 DIAGNOSIS — I5033 Acute on chronic diastolic (congestive) heart failure: Secondary | ICD-10-CM | POA: Insufficient documentation

## 2018-03-11 DIAGNOSIS — E1122 Type 2 diabetes mellitus with diabetic chronic kidney disease: Principal | ICD-10-CM | POA: Insufficient documentation

## 2018-03-11 DIAGNOSIS — I50810 Right heart failure, unspecified: Secondary | ICD-10-CM | POA: Diagnosis present

## 2018-03-11 DIAGNOSIS — F329 Major depressive disorder, single episode, unspecified: Secondary | ICD-10-CM | POA: Insufficient documentation

## 2018-03-11 DIAGNOSIS — J8410 Pulmonary fibrosis, unspecified: Secondary | ICD-10-CM | POA: Diagnosis present

## 2018-03-11 DIAGNOSIS — I2510 Atherosclerotic heart disease of native coronary artery without angina pectoris: Secondary | ICD-10-CM | POA: Insufficient documentation

## 2018-03-11 DIAGNOSIS — I5032 Chronic diastolic (congestive) heart failure: Secondary | ICD-10-CM | POA: Diagnosis present

## 2018-03-11 DIAGNOSIS — E669 Obesity, unspecified: Secondary | ICD-10-CM | POA: Diagnosis not present

## 2018-03-11 DIAGNOSIS — J9611 Chronic respiratory failure with hypoxia: Secondary | ICD-10-CM | POA: Insufficient documentation

## 2018-03-11 DIAGNOSIS — I700 Atherosclerosis of aorta: Secondary | ICD-10-CM | POA: Diagnosis not present

## 2018-03-11 DIAGNOSIS — N184 Chronic kidney disease, stage 4 (severe): Secondary | ICD-10-CM | POA: Insufficient documentation

## 2018-03-11 DIAGNOSIS — D631 Anemia in chronic kidney disease: Secondary | ICD-10-CM | POA: Insufficient documentation

## 2018-03-11 DIAGNOSIS — Z87891 Personal history of nicotine dependence: Secondary | ICD-10-CM | POA: Insufficient documentation

## 2018-03-11 DIAGNOSIS — I130 Hypertensive heart and chronic kidney disease with heart failure and stage 1 through stage 4 chronic kidney disease, or unspecified chronic kidney disease: Secondary | ICD-10-CM | POA: Diagnosis not present

## 2018-03-11 DIAGNOSIS — Z880 Allergy status to penicillin: Secondary | ICD-10-CM | POA: Insufficient documentation

## 2018-03-11 DIAGNOSIS — Z981 Arthrodesis status: Secondary | ICD-10-CM | POA: Diagnosis not present

## 2018-03-11 DIAGNOSIS — R55 Syncope and collapse: Secondary | ICD-10-CM | POA: Diagnosis not present

## 2018-03-11 DIAGNOSIS — I2781 Cor pulmonale (chronic): Secondary | ICD-10-CM | POA: Insufficient documentation

## 2018-03-11 DIAGNOSIS — J209 Acute bronchitis, unspecified: Secondary | ICD-10-CM | POA: Insufficient documentation

## 2018-03-11 DIAGNOSIS — Z9981 Dependence on supplemental oxygen: Secondary | ICD-10-CM | POA: Insufficient documentation

## 2018-03-11 DIAGNOSIS — E7800 Pure hypercholesterolemia, unspecified: Secondary | ICD-10-CM | POA: Diagnosis not present

## 2018-03-11 DIAGNOSIS — J440 Chronic obstructive pulmonary disease with acute lower respiratory infection: Secondary | ICD-10-CM | POA: Diagnosis not present

## 2018-03-11 DIAGNOSIS — M1990 Unspecified osteoarthritis, unspecified site: Secondary | ICD-10-CM | POA: Insufficient documentation

## 2018-03-11 DIAGNOSIS — Z85118 Personal history of other malignant neoplasm of bronchus and lung: Secondary | ICD-10-CM | POA: Diagnosis not present

## 2018-03-11 DIAGNOSIS — Z79899 Other long term (current) drug therapy: Secondary | ICD-10-CM | POA: Diagnosis not present

## 2018-03-11 DIAGNOSIS — I10 Essential (primary) hypertension: Secondary | ICD-10-CM | POA: Diagnosis present

## 2018-03-11 DIAGNOSIS — J208 Acute bronchitis due to other specified organisms: Secondary | ICD-10-CM

## 2018-03-11 DIAGNOSIS — E118 Type 2 diabetes mellitus with unspecified complications: Secondary | ICD-10-CM

## 2018-03-11 DIAGNOSIS — I272 Pulmonary hypertension, unspecified: Secondary | ICD-10-CM | POA: Diagnosis present

## 2018-03-11 DIAGNOSIS — R0600 Dyspnea, unspecified: Secondary | ICD-10-CM

## 2018-03-11 DIAGNOSIS — I2729 Other secondary pulmonary hypertension: Secondary | ICD-10-CM | POA: Diagnosis not present

## 2018-03-11 DIAGNOSIS — R5383 Other fatigue: Secondary | ICD-10-CM | POA: Diagnosis present

## 2018-03-11 DIAGNOSIS — J449 Chronic obstructive pulmonary disease, unspecified: Secondary | ICD-10-CM | POA: Diagnosis present

## 2018-03-11 DIAGNOSIS — Z794 Long term (current) use of insulin: Secondary | ICD-10-CM | POA: Insufficient documentation

## 2018-03-11 DIAGNOSIS — J431 Panlobular emphysema: Secondary | ICD-10-CM

## 2018-03-11 LAB — IRON AND TIBC
Iron: 23 ug/dL — ABNORMAL LOW (ref 28–170)
Saturation Ratios: 5 % — ABNORMAL LOW (ref 10.4–31.8)
TIBC: 435 ug/dL (ref 250–450)
UIBC: 412 ug/dL

## 2018-03-11 LAB — URINALYSIS, ROUTINE W REFLEX MICROSCOPIC
Bilirubin Urine: NEGATIVE
GLUCOSE, UA: NEGATIVE mg/dL
Hgb urine dipstick: NEGATIVE
Ketones, ur: NEGATIVE mg/dL
Nitrite: POSITIVE — AB
PH: 6 (ref 5.0–8.0)
Protein, ur: NEGATIVE mg/dL
Specific Gravity, Urine: 1.012 (ref 1.005–1.030)

## 2018-03-11 LAB — BASIC METABOLIC PANEL
Anion gap: 12 (ref 5–15)
BUN: 40 mg/dL — ABNORMAL HIGH (ref 8–23)
CALCIUM: 8.5 mg/dL — AB (ref 8.9–10.3)
CO2: 29 mmol/L (ref 22–32)
CREATININE: 1.78 mg/dL — AB (ref 0.44–1.00)
Chloride: 99 mmol/L (ref 98–111)
GFR, EST AFRICAN AMERICAN: 32 mL/min — AB (ref >60)
GFR, EST NON AFRICAN AMERICAN: 28 mL/min — AB (ref >60)
Glucose, Bld: 195 mg/dL — ABNORMAL HIGH (ref 70–99)
Potassium: 3.4 mmol/L — ABNORMAL LOW (ref 3.5–5.1)
SODIUM: 140 mmol/L (ref 135–145)

## 2018-03-11 LAB — CBC
HCT: 25.8 % — ABNORMAL LOW (ref 36.0–46.0)
Hemoglobin: 7.9 g/dL — ABNORMAL LOW (ref 12.0–15.0)
MCH: 27.1 pg (ref 26.0–34.0)
MCHC: 30.6 g/dL (ref 30.0–36.0)
MCV: 88.7 fL (ref 78.0–100.0)
PLATELETS: 172 10*3/uL (ref 150–400)
RBC: 2.91 MIL/uL — AB (ref 3.87–5.11)
RDW: 15.2 % (ref 11.5–15.5)
WBC: 8.8 10*3/uL (ref 4.0–10.5)

## 2018-03-11 LAB — I-STAT TROPONIN, ED: Troponin i, poc: 0.02 ng/mL (ref 0.00–0.08)

## 2018-03-11 LAB — GLUCOSE, CAPILLARY: GLUCOSE-CAPILLARY: 182 mg/dL — AB (ref 70–99)

## 2018-03-11 LAB — CBG MONITORING, ED: GLUCOSE-CAPILLARY: 174 mg/dL — AB (ref 70–99)

## 2018-03-11 LAB — POC OCCULT BLOOD, ED: FECAL OCCULT BLD: NEGATIVE

## 2018-03-11 LAB — BRAIN NATRIURETIC PEPTIDE: B NATRIURETIC PEPTIDE 5: 246.9 pg/mL — AB (ref 0.0–100.0)

## 2018-03-11 LAB — FERRITIN: FERRITIN: 17 ng/mL (ref 11–307)

## 2018-03-11 LAB — PREPARE RBC (CROSSMATCH)

## 2018-03-11 MED ORDER — VITAMIN D 1000 UNITS PO TABS
2000.0000 [IU] | ORAL_TABLET | Freq: Every day | ORAL | Status: DC
  Administered 2018-03-12 – 2018-03-13 (×2): 2000 [IU] via ORAL
  Filled 2018-03-11 (×3): qty 2

## 2018-03-11 MED ORDER — TRAMADOL HCL 50 MG PO TABS
50.0000 mg | ORAL_TABLET | Freq: Three times a day (TID) | ORAL | Status: DC | PRN
  Administered 2018-03-13: 50 mg via ORAL
  Filled 2018-03-11: qty 1

## 2018-03-11 MED ORDER — SODIUM CHLORIDE 0.9 % IV SOLN
250.0000 mL | INTRAVENOUS | Status: DC | PRN
  Administered 2018-03-12: 250 mL via INTRAVENOUS

## 2018-03-11 MED ORDER — ACETAMINOPHEN 325 MG PO TABS: 650.0000 mg | ORAL_TABLET | Freq: Four times a day (QID) | ORAL | Status: DC | PRN

## 2018-03-11 MED ORDER — FUROSEMIDE 10 MG/ML IJ SOLN
60.0000 mg | Freq: Two times a day (BID) | INTRAMUSCULAR | Status: DC
  Administered 2018-03-12 – 2018-03-13 (×3): 60 mg via INTRAVENOUS
  Filled 2018-03-11 (×3): qty 6

## 2018-03-11 MED ORDER — INSULIN DETEMIR 100 UNIT/ML ~~LOC~~ SOLN
36.0000 [IU] | Freq: Every day | SUBCUTANEOUS | Status: DC
  Administered 2018-03-12 (×2): 36 [IU] via SUBCUTANEOUS
  Filled 2018-03-11 (×3): qty 0.36

## 2018-03-11 MED ORDER — SODIUM CHLORIDE 0.9% FLUSH
3.0000 mL | Freq: Two times a day (BID) | INTRAVENOUS | Status: DC
  Administered 2018-03-12 – 2018-03-13 (×3): 3 mL via INTRAVENOUS

## 2018-03-11 MED ORDER — ENOXAPARIN SODIUM 30 MG/0.3ML ~~LOC~~ SOLN
30.0000 mg | Freq: Every day | SUBCUTANEOUS | Status: DC
  Administered 2018-03-12: 30 mg via SUBCUTANEOUS
  Filled 2018-03-11 (×2): qty 0.3

## 2018-03-11 MED ORDER — INSULIN ASPART 100 UNIT/ML ~~LOC~~ SOLN: 0.0000 [IU] | Freq: Every day | SUBCUTANEOUS | Status: DC

## 2018-03-11 MED ORDER — SODIUM CHLORIDE 0.9% FLUSH: 3.0000 mL | INTRAVENOUS | Status: DC | PRN

## 2018-03-11 MED ORDER — GABAPENTIN 300 MG PO CAPS
300.0000 mg | ORAL_CAPSULE | Freq: Two times a day (BID) | ORAL | Status: DC
  Administered 2018-03-12 – 2018-03-13 (×4): 300 mg via ORAL
  Filled 2018-03-11 (×4): qty 1

## 2018-03-11 MED ORDER — LEVOFLOXACIN IN D5W 500 MG/100ML IV SOLN
500.0000 mg | INTRAVENOUS | Status: DC
  Administered 2018-03-12: 500 mg via INTRAVENOUS
  Filled 2018-03-11: qty 100

## 2018-03-11 MED ORDER — ACETAMINOPHEN 650 MG RE SUPP: 650.0000 mg | Freq: Four times a day (QID) | RECTAL | Status: DC | PRN

## 2018-03-11 MED ORDER — ONDANSETRON HCL 4 MG PO TABS: 4.0000 mg | ORAL_TABLET | Freq: Four times a day (QID) | ORAL | Status: DC | PRN

## 2018-03-11 MED ORDER — FUROSEMIDE 10 MG/ML IJ SOLN: 60.0000 mg | Freq: Two times a day (BID) | INTRAMUSCULAR | Status: DC

## 2018-03-11 MED ORDER — SODIUM CHLORIDE 0.9% IV SOLUTION
Freq: Once | INTRAVENOUS | Status: AC
  Administered 2018-03-11: 23:00:00 via INTRAVENOUS

## 2018-03-11 MED ORDER — ROSUVASTATIN CALCIUM 20 MG PO TABS
40.0000 mg | ORAL_TABLET | Freq: Every day | ORAL | Status: DC
  Administered 2018-03-12: 40 mg via ORAL
  Filled 2018-03-11 (×2): qty 2

## 2018-03-11 MED ORDER — ASPIRIN EC 81 MG PO TBEC
81.0000 mg | DELAYED_RELEASE_TABLET | Freq: Every day | ORAL | Status: DC
  Administered 2018-03-12 – 2018-03-13 (×2): 81 mg via ORAL
  Filled 2018-03-11 (×3): qty 1

## 2018-03-11 MED ORDER — GUAIFENESIN ER 600 MG PO TB12
600.0000 mg | ORAL_TABLET | Freq: Two times a day (BID) | ORAL | Status: DC | PRN
  Administered 2018-03-12: 600 mg via ORAL
  Filled 2018-03-11 (×2): qty 1

## 2018-03-11 MED ORDER — INSULIN ASPART 100 UNIT/ML ~~LOC~~ SOLN
0.0000 [IU] | Freq: Three times a day (TID) | SUBCUTANEOUS | Status: DC
  Administered 2018-03-12: 2 [IU] via SUBCUTANEOUS
  Administered 2018-03-12: 3 [IU] via SUBCUTANEOUS
  Administered 2018-03-13: 2 [IU] via SUBCUTANEOUS
  Administered 2018-03-13: 3 [IU] via SUBCUTANEOUS

## 2018-03-11 MED ORDER — MOMETASONE FURO-FORMOTEROL FUM 200-5 MCG/ACT IN AERO
2.0000 | INHALATION_SPRAY | Freq: Two times a day (BID) | RESPIRATORY_TRACT | Status: DC
  Administered 2018-03-12 – 2018-03-13 (×3): 2 via RESPIRATORY_TRACT
  Filled 2018-03-11: qty 8.8

## 2018-03-11 MED ORDER — ONDANSETRON HCL 4 MG/2ML IJ SOLN
4.0000 mg | Freq: Once | INTRAMUSCULAR | Status: AC
  Administered 2018-03-11: 4 mg via INTRAVENOUS
  Filled 2018-03-11: qty 2

## 2018-03-11 MED ORDER — SERTRALINE HCL 50 MG PO TABS
150.0000 mg | ORAL_TABLET | Freq: Every day | ORAL | Status: DC
  Administered 2018-03-12 – 2018-03-13 (×2): 150 mg via ORAL
  Filled 2018-03-11 (×2): qty 1

## 2018-03-11 MED ORDER — ONDANSETRON HCL 4 MG/2ML IJ SOLN
4.0000 mg | Freq: Four times a day (QID) | INTRAMUSCULAR | Status: DC | PRN
  Administered 2018-03-12: 4 mg via INTRAVENOUS
  Filled 2018-03-11: qty 2

## 2018-03-11 MED ORDER — IPRATROPIUM-ALBUTEROL 0.5-2.5 (3) MG/3ML IN SOLN
3.0000 mL | Freq: Three times a day (TID) | RESPIRATORY_TRACT | Status: DC
  Administered 2018-03-12 – 2018-03-13 (×3): 3 mL via RESPIRATORY_TRACT
  Filled 2018-03-11 (×4): qty 3

## 2018-03-11 MED ORDER — MACITENTAN 10 MG PO TABS
10.0000 mg | ORAL_TABLET | Freq: Every day | ORAL | Status: DC
  Administered 2018-03-12 – 2018-03-13 (×2): 10 mg via ORAL
  Filled 2018-03-11 (×2): qty 1

## 2018-03-11 NOTE — H&P (Signed)
[facility] History and Physical  Amy Shepard KYH:[id number] DOB: 1946/02/27 DOA: 03/11/2018  Referring physician: Dr Eulis Foster, ED MD PCP: Maurice Small, MD   Chief Complaint: SOB, syncopal episode  HPI: Amy Shepard is a 72 y.o. female w/ hx of pulm fibrosis and COPD, diast CHF, home oxygen, pulm HTN presenting w/ fatigue/ DOE for last 1 week. Started harsh cough x 2-3 days, nonprod.  N/V x 1 this am, no abd pain and no urinary c/o's. No severe SOB.  Ankles usually swell w/ CHF, none now. In ED Hb was 7.9, down to 10- 11 6 mos ago.  Asked to see for admission.    FOB neg in ED.  Pt denies fevers, was given Zpak for her cough but hadn't started it yet.  Lives by herself in an apt in [place].  No tobacco/ etoh.  Walks w/ a walker at baseline no more than across the room.  Reportedly passed out in ED 2 x, pt doesn' t remember.      Chart:  2008 - CAP/ VDRF, DM, tobacco, hypotension  2010 - COPD/ tobacco w/ RUL nodule underwent segmentectomy w/ mixed carcinoma findings. Path report pending  02/2016 - lumbar spinal fusion  02/2016 - acute resp failure/ hypoxia/ COPD + a/c diast CHF. Rx w/ IV lasix, steroids, nebs and abx. Improved. Hx lung Ca stage IIb NSCLC in 2009 rx/ w resection and chemoRx completed 2010.   12/ 2017 - a/c diast CHF / SOB rx w/ IV lasix.   02/2017 - SOB / acute resp failure due to a/c diast CHF > diuresed neg 6.3 L  03/2017 - SOB/ a/c diast CHF > rx IV lasix. Pt likely has cor pulmonale. VQ scan negative.   04/2017 - SOB due to a/c diast CHF, CKD II, rx w/ IV lasix as prior. DC SNF.    05/2017 - sepsis due to UTI vs PNA rx w levaquin, VQ negative. Echo no change.  AMS.  Foot fracture.       Date  Creat  egfr  2010- 17  0.5- 0.8  2018  1.2- 1.18 Jul 2017 1.20- 2.02 27- 43  July 2019 2.1- 2.25 21- 24  Mar 11 2018 1.78  28- 32  ROS  denies CP  no joint pain   no HA  no blurry vision  no rash  no diarrhea  no nausea/ vomiting  no dysuria  no difficulty  voiding  no change in urine color    Past Medical History  Past Medical History:  Diagnosis Date  . Arthritis   . CAD (coronary artery disease)    a. Prior cath 2015 showed 40% prox AD, 50-50% mLAD, otherwise calcification but no obstruction in LCx/RCA.. Medical management recommended. b. 2017: low-risk NST.  [name] Chronic diastolic CHF (congestive heart failure) ([place])   . Chronic respiratory failure ([place])   . CKD (chronic kidney disease), stage III ([place])   . COPD (chronic obstructive pulmonary disease) ([place])   . Cor pulmonale ([name])    a. felt due to advanced COPD and noncompliance with O2.  . Depression   . Diabetes mellitus    [name]    dx  2008  . Fracture    left foot  . Hx of seasonal allergies   . Hypercholesteremia   . Hypertension    on no medications  . Lung cancer ([place])   . On home oxygen therapy    "2.5L; 24/7" (03/10/2017)  . Pericardial effusion  a. small-moderate in 07/2016.  [name] UTI (urinary tract infection)    Past Surgical History  Past Surgical History:  Procedure Laterality Date  . [facility] or [facility] in [place]     bilateral cataracts  . LEFT HEART CATHETERIZATION WITH CORONARY ANGIOGRAM N/A 07/12/2014   Procedure: LEFT HEART CATHETERIZATION WITH CORONARY ANGIOGRAM;  Surgeon: [name], MD;  Location: [facility] CATH LAB;  Service: Cardiovascular;  Laterality: N/A;  . MAXIMUM ACCESS (MAS)POSTERIOR LUMBAR INTERBODY FUSION (PLIF) 2 LEVEL N/A 02/22/2016   Procedure: Lumbar three-four - Lumbar four-five  MAXIMUM ACCESS SURGERY  POSTERIOR LUMBAR INTERBODY FUSION;  Surgeon: [name], MD;  Location: [facility] NEURO ORS;  Service: Neurosurgery;  Laterality: N/A;  . ORIF TOE FRACTURE Left 06/12/2017   Procedure: OPEN REDUCTION INTERNAL FIXATION (ORIF) BASE 1ST METATARSAL (TOE) FRACTURE;  Surgeon: Newt Minion, MD;  Location: [facility];  Service: Orthopedics;  Laterality: Left;  . RIGHT HEART CATH N/A 08/06/2017    Procedure: RIGHT HEART CATH;  Surgeon: Larey Dresser, MD;  Location: [facility] CV LAB;  Service: Cardiovascular;  Laterality: N/A;  . THORACOTOMY Right 2010   lower  . TONSILLECTOMY     Family History  Family History  Problem Relation Age of Onset  . Diabetes Father   . Heart failure Father   . Cancer Sister   . Diabetes Brother   . Heart failure Brother    Social History  reports that she quit smoking about 11 years ago. She has a 30.00 pack-year smoking history. She has never used smokeless tobacco. She reports that she does not drink alcohol or use drugs. Allergies  Allergies  Allergen Reactions  . Amoxicillin Anaphylaxis, Hives, Rash and Other (See Comments)    Has patient had a PCN reaction causing immediate rash, facial/tongue/throat swelling, SOB or lightheadedness with hypotension: YES Positive reaction causing SEVERE RASH INVOLVING MUCUS MEMBRANES/SKIN NECROSIS: YES Reaction that required HOSPITALIZATION: YES Reaction occurring within the last 10 years: NO   Home medications Prior to Admission medications   Medication Sig Start Date End Date Taking? Authorizing Provider  OXYGEN Inhale 3 L into the lungs continuous. FOR COPD   Yes [provider]  acetaminophen (TYLENOL) 325 MG tablet Take 650 mg by mouth every 6 (six) hours as needed for moderate pain or fever.     [provider]  aspirin EC 81 MG tablet Take 81 mg by mouth daily.     [provider]  Carboxymethylcellulose Sod PF (REFRESH CELLUVISC) 1 % GEL Place 1 drop into both eyes 2 (two) times daily.    [provider]  Cholecalciferol (VITAMIN D) 2000 units CAPS Take 2,000 Units by mouth daily.    [provider]  ciprofloxacin (CIPRO) 250 MG tablet Take 1 tablet (250 mg total) by mouth daily. 01/29/18   Drenda Freeze, MD  Fluticasone-Salmeterol (ADVAIR) 250-50 MCG/DOSE AEPB Inhale 1 puff into the lungs 2 (two) times daily.    [provider]  furosemide  (LASIX) 40 MG tablet TAKE 1 TABLET BY MOUTH TWICE A DAY 02/23/18   Larey Dresser, MD  gabapentin (NEURONTIN) 300 MG capsule Take 300 mg by mouth 3 (three) times daily.    [provider]  guaiFENesin (MUCINEX) 600 MG 12 hr tablet Take 1 tablet (600 mg total) by mouth 2 (two) times daily as needed for cough or to loosen phlegm. 05/07/17   Starla Link,  Kshitiz, MD  insulin aspart (NOVOLOG) 100 UNIT/ML injection Inject 4 Units into the skin 3 (three) times daily with meals. 05/07/17   Aline August, MD  Insulin Detemir (LEVEMIR FLEXTOUCH) 100 UNIT/ML Pen Inject 36 Units into the skin at bedtime.     [provider]  insulin lispro (HUMALOG) 100 UNIT/ML injection Inject 2-11 Units into the skin See admin instructions. Inject 6 units with each meal and add additional units per sliding scale: BGL 101-150 = 2 units; 151-200 = 3 units; 201-250 = 5 units; 251-300 = 7 units; 301-350 = 9 units; >350 = 11 units; CALL MD IF BGL IS >400 OR <60    [provider]  ipratropium-albuterol (DUONEB) 0.5-2.5 (3) MG/3ML SOLN Take 3 mLs by nebulization every 4 (four) hours as needed (wheezing, Shortness of breath). 03/13/16   Johnson, Clanford L, MD  lisinopril (PRINIVIL,ZESTRIL) 10 MG tablet Take 0.5 tablets (5 mg total) by mouth daily. 12/21/17   Larey Dresser, MD  Macitentan (OPSUMIT) 10 MG TABS Take 1 tablet (10 mg total) by mouth daily. 08/17/17   Larey Dresser, MD  potassium chloride SA (K-DUR,KLOR-CON) 20 MEQ tablet Take 40 meq (2 tabs) in AM, then 20 meq (1 tab) in PM 10/27/17   Larey Dresser, MD  rosuvastatin (CRESTOR) 40 MG tablet Take 1 tablet (40 mg total) by mouth daily at 6 PM. 10/23/17   Larey Dresser, MD  sertraline (ZOLOFT) 50 MG tablet Take 150 mg by mouth daily.     [provider]  spironolactone (ALDACTONE) 25 MG tablet TAKE 1/2 TABLET ONCE A DAY Patient taking differently: Take 12.5 mg by mouth daily.  02/23/18   Larey Dresser, MD  traMADol (ULTRAM) 50 MG tablet  Take 1 tablet (50 mg total) by mouth every 8 (eight) hours as needed (for pain). 06/06/17   Danford, Suann Larry, MD  TRULICITY 3.43 HW/8.6HU SOPN Inject 0.5 mLs as directed once a week. Sundays 01/28/18   [provider]   Liver Function Tests No results for input(s): AST, ALT, ALKPHOS, BILITOT, PROT, ALBUMIN in the last 168 hours. No results for input(s): LIPASE, AMYLASE in the last 168 hours. CBC Recent Labs  Lab 03/11/18 1431  WBC 8.8  HGB 7.9*  HCT 25.8*  MCV 88.7  PLT 837   Basic Metabolic Panel Recent Labs  Lab 03/11/18 1431  NA 140  K 3.4*  CL 99  CO2 29  GLUCOSE 195*  BUN 40*  CREATININE 1.78*  CALCIUM 8.5*     Vitals:   03/11/18 1800 03/11/18 1830 03/11/18 1910 03/11/18 2030  BP: 134/72 122/86 116/62 (!) 134/58  Pulse: 75 78 79 76  Resp: 15 17 (!) 21 17  Temp:      TempSrc:      SpO2: 97% 95% 90% 92%  Weight:      Height:       Exam: Gen harsh cough, no distress, spells of coughing No rash, cyanosis or gangrene Sclera anicteric, throat clear +JVD no bruits Chest coarse exp wheezes, occ bibasilar crackles bu tmostly clear RRR no MRG Abd soft ntnd no mass or ascites +bs GU defer MS no joint effusions or deformity Ext no sig LE edema / no wounds or ulcers Neuro is alert, Ox 3 , nf   EKG > Sinus rhythm Incomplete RBBB and LAFB Abnormal R-wave progression, late transition Borderline prolonged QT interval bipahsic t wave in lead III No significant change since last tracing  Date  Creat  egfr  2010- 17  0.5- 0.8  2018  1.2- 1.18 Jul 2017 1.20- 2.02 27- 43  July 2019 2.1- 2.25 21- 24  Mar 11 2018 1.78  28- 32    Home meds: - home O2/ advair bid/ mucinex prn/ duoneb q 4hr - rosuvastatin 40 qd/ ecasa 81 qd - spironolactone 12.5 qd/ lisinopril 47m qd/ furosemide 40 bid/ Kdur 40 am + 20 pm - gabapentin 300 tid/ sertraline 150 qd - insulin aspart 4u tid sq/ insulin deetemir 36 u hs/ insulin lispro SSI ac tid 2-11 u - macitentan 10  mg qd   Na 140  K 3.4  BUN 40  Cr 1.78  CO2 29  Ca 8.5  eGFR 30   bnp 247  WBC 8k  Hb 7.9  plt 172     UA today > 0-5 wbc/ rbc, neg protein, large LE/+ nitrite/ few bact  CXR 8/29 > COPD/ CM, no acute changes   FOB negative x 1  ECHO 05/2017 >  LVEF 55%    Assessment: 1. Fatigue / anemia - normocytic anemia it pt w/ lung disease/ pulm fibrosis and pulm HTN.  Give 1 unit prbc tonight, send anemia panel.  Could be CKD related also.  2. Hist of chron diast CHF - CXR read as chronic changes no edema but looks a little worse than last film to me, concerned about 2u prbc;s overnight.  Will give IV lasix 60 mg q 12 x 2 along w/ just 1unit prbc's.  3. Cough / acute bronchitis- harsh cough, possible bronchitis vs CHF, no infiltrate on CXR.  Severe PCN allergy, use levaquin.   4. Syncope - suspect syncope due to coughing spells and pulm HTN /vasodilators on board.  Antitussives.  5. Chron resp failure/ pulm fibrosis - cont O2 at 3 lpm, cont meds 6. CKD stage IV - baseline creat 1.8- 2.2.  Cr 1.78 today. Hold lisinopril for now. Lower gabapentin dose.   7. DM2 - cont home detemir + SSI here 8. Pulm HTN - cont home med macitentan, not sure if they will have it tho 9. HTN - hold acei for 1 -2 days. IV lasix.       Plan - as above       [place] [facility] Pager [phone]  If 7PM-7AM, please contact night-coverage [email or web address] Password [email or web address]8/29/2019, 9:14 PM

## 2018-03-11 NOTE — ED Notes (Signed)
Pt in xray

## 2018-03-11 NOTE — ED Notes (Signed)
Pt given [place] sandwich, ok by EDP

## 2018-03-11 NOTE — ED Provider Notes (Signed)
[facility] EMERGENCY DEPARTMENT Provider Note   CSN: [id number] Arrival date & time: 03/11/18  1345     History   Chief Complaint Chief Complaint  Patient presents with  . Emesis  . Loss of Consciousness  . Headache    HPI Amy Shepard is a 72 y.o. female.  The history is provided by the patient and medical records. No language interpreter was used.  Emesis   Associated symptoms include headaches.  Loss of Consciousness   Associated symptoms include headaches and vomiting.  Headache   Associated symptoms include syncope and vomiting.     72 year old female with history of diabetes, COPD, lung cancer, oxygen dependent, brought here via EMS from home for evaluation of shortness of breath.  Patient states for the past 3 to 4 days she has had recurrent headaches.  She described headache as a sharp throbbing sensation start from the right side of her head and wraps to the left side, waxing waning, minimal at this time.  She also complaining of feeling increased shortness of breath for the same duration as well as noticing fluid gain in her abdomen.  Shortness of breath can be with exertion or while laying down.  She is on 3 L of oxygen at home.  This morning, she also endorsed nausea and vomited twice after she ate her breakfast.  She endorsed some generalized weakness.  Her nausea is mostly resolved.  Her primary concern is her shortness of breath.  Patient endorsed occasional cough that is nonproductive.  She denies having fever, chills, neck stiffness, confusion, abdominal pain, back pain, diarrhea or constipation.  Per triage note, patient had a syncopal episode when she was transferred from the EMS stretcher to the ED stretcher and also had one episode of light brown emesis.  She was also noted to be hypoxic with O2 sats of 85 to 88%'s upon arrival at 3 L.  O2 sats improved when she was placed on 4 L.  Patient did receive 200 mL of normal saline prior to  arrival.  Patient did endorse some mild left-sided chest pressure earlier today that has since resolved. Does endorse some recurrent night sweats.  Past Medical History:  Diagnosis Date  . Arthritis   . CAD (coronary artery disease)    a. Prior cath 2015 showed 40% prox AD, 50-50% mLAD, otherwise calcification but no obstruction in LCx/RCA.. Medical management recommended. b. 2017: low-risk NST.  [name] Chronic diastolic CHF (congestive heart failure) ([place])   . Chronic respiratory failure ([place])   . CKD (chronic kidney disease), stage III ([place])   . COPD (chronic obstructive pulmonary disease) ([place])   . Cor pulmonale ([name])    a. felt due to advanced COPD and noncompliance with O2.  . Depression   . Diabetes mellitus    [name]    dx  2008  . Fracture    left foot  . Hx of seasonal allergies   . Hypercholesteremia   . Hypertension    on no medications  . Lung cancer ([place])   . On home oxygen therapy    "2.5L; 24/7" (03/10/2017)  . Pericardial effusion    a. small-moderate in 07/2016.  [name] UTI (urinary tract infection)     Patient Active Problem List   Diagnosis Date Noted  . Lisfranc dislocation, left, subsequent encounter 06/08/2017  . Chronic respiratory failure with hypoxia ([place]) 06/03/2017  . Falls 06/03/2017  . Hypokalemia 06/03/2017  . Sepsis secondary to UTI ([place]) 06/02/2017  .  Dog bite 05/02/2017  . CHF exacerbation ([place]) 05/02/2017  . Acute diastolic CHF (congestive heart failure) ([place]) 05/02/2017  . Chronic diastolic CHF (congestive heart failure) ([place]) 04/02/2017  . Cor pulmonale ([place]) 04/02/2017  . Acute on chronic respiratory failure ([place]) 03/10/2017  . CKD (chronic kidney disease), stage III ([place]) 03/10/2017  . Chronic pain 03/10/2017  . Lactic acidosis 02/10/2017  . Acute respiratory failure with hypoxia ([facility]) 02/09/2017  . Acute on chronic diastolic CHF (congestive heart failure) ([place]) 06/23/2016  . HTN (hypertension) 06/23/2016  . Type 2 diabetes mellitus with  complication, with long-term current use of insulin ([place]) 03/11/2016  . Depression 03/11/2016  . Chronic obstructive pulmonary disease ([place]) 03/10/2016  . S/P lumbar spinal fusion 02/22/2016  . Coronary artery disease involving native coronary artery 07/12/2014  . Pain in the chest   . Malignant neoplasm of lower lobe of right lung ([place]) 05/25/2014  . Lung cancer ([place]) 09/01/2012    Past Surgical History:  Procedure Laterality Date  . [facility] in [place]     bilateral cataracts  . LEFT HEART CATHETERIZATION WITH CORONARY ANGIOGRAM N/A 07/12/2014   Procedure: LEFT HEART CATHETERIZATION WITH CORONARY ANGIOGRAM;  Surgeon: [name], MD;  Location: [facility] CATH LAB;  Service: Cardiovascular;  Laterality: N/A;  . MAXIMUM ACCESS (MAS)POSTERIOR LUMBAR INTERBODY FUSION (PLIF) 2 LEVEL N/A 02/22/2016   Procedure: Lumbar three-four - Lumbar four-five  MAXIMUM ACCESS SURGERY  POSTERIOR LUMBAR INTERBODY FUSION;  Surgeon: [name], MD;  Location: [facility]URO ORS;  Service: Neurosurgery;  Laterality: N/A;  . ORIF TOE FRACTURE Left 06/12/2017   Procedure: OPEN REDUCTION INTERNAL FIXATION (ORIF) BASE 1ST METATARSAL (TOE) FRACTURE;  Surgeon: Newt Minion, MD;  Location: [facility];  Service: Orthopedics;  Laterality: Left;  . RIGHT HEART CATH N/A 08/06/2017   Procedure: RIGHT HEART CATH;  Surgeon: Larey Dresser, MD;  Location: [facility] CV LAB;  Service: Cardiovascular;  Laterality: N/A;  . THORACOTOMY Right 2010   lower  . TONSILLECTOMY       OB History   None      Home Medications    Prior to Admission medications   Medication Sig Start Date End Date Taking? Authorizing Provider  acetaminophen (TYLENOL) 325 MG tablet Take 650 mg by mouth every 6 (six) hours as needed for moderate pain or fever.     [provider]  aspirin EC 81 MG tablet Take 81 mg by mouth daily.     [provider]    Carboxymethylcellulose Sod PF (REFRESH CELLUVISC) 1 % GEL Place 1 drop into both eyes 2 (two) times daily.    [provider]  Cholecalciferol (VITAMIN D) 2000 units CAPS Take 2,000 Units by mouth daily.    [provider]  ciprofloxacin (CIPRO) 250 MG tablet Take 1 tablet (250 mg total) by mouth daily. 01/29/18   Drenda Freeze, MD  Fluticasone-Salmeterol (ADVAIR) 250-50 MCG/DOSE AEPB Inhale 1 puff into the lungs 2 (two) times daily.    [provider]  furosemide (LASIX) 40 MG tablet TAKE 1 TABLET BY MOUTH TWICE A DAY 02/23/18   Larey Dresser, MD  gabapentin (NEURONTIN) 300 MG capsule Take 300 mg by mouth 3 (three) times daily.    [provider]  guaiFENesin (MUCINEX) 600 MG 12 hr tablet Take 1 tablet (600 mg total) by mouth 2 (two) times daily as needed for cough  or to loosen phlegm. 05/07/17   Aline August, MD  insulin aspart (NOVOLOG) 100 UNIT/ML injection Inject 4 Units into the skin 3 (three) times daily with meals. Patient not taking: Reported on 01/29/2018 05/07/17   Aline August, MD  Insulin Detemir (LEVEMIR FLEXTOUCH) 100 UNIT/ML Pen Inject 36 Units into the skin at bedtime.     [provider]  insulin lispro (HUMALOG) 100 UNIT/ML injection Inject 2-11 Units into the skin See admin instructions. Inject 6 units with each meal and add additional units per sliding scale: BGL 101-150 = 2 units; 151-200 = 3 units; 201-250 = 5 units; 251-300 = 7 units; 301-350 = 9 units; >350 = 11 units; CALL MD IF BGL IS >400 OR <60    [provider]  ipratropium-albuterol (DUONEB) 0.5-2.5 (3) MG/3ML SOLN Take 3 mLs by nebulization every 4 (four) hours as needed (wheezing, Shortness of breath). 03/13/16   Johnson, Clanford L, MD  lisinopril (PRINIVIL,ZESTRIL) 10 MG tablet Take 0.5 tablets (5 mg total) by mouth daily. 12/21/17   Larey Dresser, MD  Macitentan (OPSUMIT) 10 MG TABS Take 1 tablet (10 mg total) by mouth daily. 08/17/17   Larey Dresser,  MD  OXYGEN Inhale 3 L into the lungs continuous. FOR COPD    [provider]  potassium chloride SA (K-DUR,KLOR-CON) 20 MEQ tablet Take 40 meq (2 tabs) in AM, then 20 meq (1 tab) in PM 10/27/17   Larey Dresser, MD  rosuvastatin (CRESTOR) 40 MG tablet Take 1 tablet (40 mg total) by mouth daily at 6 PM. Patient not taking: Reported on 12/21/2017 10/23/17   Larey Dresser, MD  sertraline (ZOLOFT) 50 MG tablet Take 150 mg by mouth daily.     [provider]  spironolactone (ALDACTONE) 25 MG tablet TAKE 1/2 TABLET ONCE A DAY 02/23/18   Larey Dresser, MD  traMADol (ULTRAM) 50 MG tablet Take 1 tablet (50 mg total) by mouth every 8 (eight) hours as needed (for pain). Patient not taking: Reported on 12/21/2017 06/06/17   Edwin Dada, MD  TRULICITY 4.27 CW/2.3JS SOPN Inject 0.5 mLs as directed once a week. Sundays 01/28/18   [provider]    Family History Family History  Problem Relation Age of Onset  . Diabetes Father   . Heart failure Father   . Cancer Sister   . Diabetes Brother   . Heart failure Brother     Social History Social History   Tobacco Use  . Smoking status: Former Smoker    Packs/day: 1.00    Years: 30.00    Pack years: 30.00    Last attempt to quit: 07/14/2006    Years since quitting: 11.6  . Smokeless tobacco: Never Used  Substance Use Topics  . Alcohol use: No  . Drug use: No     Allergies   Amoxicillin   Review of Systems Review of Systems  Cardiovascular: Positive for syncope.  Gastrointestinal: Positive for vomiting.  Neurological: Positive for headaches.  All other systems reviewed and are negative.    Physical Exam Updated Vital Signs BP 135/64 (BP Location: Right Arm)   Pulse 81   Temp 99 F (37.2 C) (Oral)   Resp 18   Ht _0  (1.549 m)   Wt 78.9 kg   SpO2 93%   BMI 32.88 kg/m   Physical Exam  Constitutional: She appears well-developed and well-nourished. No distress.  Elderly female in no  acute discomfort.  HENT:  Head: Atraumatic.  Eyes: Pupils are equal, round, and reactive to light. Conjunctivae and EOM are normal.  Neck: Normal range of motion. Neck supple. No JVD present.  No nuchal rigidity  Cardiovascular: Normal rate and regular rhythm.  Murmur heard. Pulmonary/Chest: Effort normal and breath sounds normal. She has no rales.  Coarse breath sounds bilaterally  Abdominal: Soft.  Musculoskeletal: Normal range of motion. She exhibits edema (Trace edema to bilateral lower extremities with intact pedal pulses.).  Neurological: She is alert. She has normal strength. No cranial nerve deficit or sensory deficit. She displays a negative Romberg sign. GCS eye subscore is 4. GCS verbal subscore is 5. GCS motor subscore is 6.  Skin: No rash noted.  Psychiatric: She has a normal mood and affect.  Nursing note and vitals reviewed.    ED Treatments / Results  Labs (all labs ordered are listed, but only abnormal results are displayed) Labs Reviewed  CBC - Abnormal; Notable for the following components:      Result Value   RBC 2.91 (*)    Hemoglobin 7.9 (*)    HCT 25.8 (*)    All other components within normal limits  CBG MONITORING, ED - Abnormal; Notable for the following components:   Glucose-Capillary 174 (*)    All other components within normal limits  BASIC METABOLIC PANEL  URINALYSIS, ROUTINE W REFLEX MICROSCOPIC    EKG EKG Interpretation  Date/Time:  Thursday March 11 2018 14:25:55 EDT Ventricular Rate:  82 PR Interval:    QRS Duration: 113 QT Interval:  420 QTC Calculation: 491 R Axis:   -72 Text Interpretation:  Sinus rhythm Incomplete RBBB and LAFB Abnormal R-wave progression, late transition Borderline prolonged QT interval bipahsic t wave in lead III No significant change since last tracing Confirmed by Deno Etienne [phone]) on 03/11/2018 2:40:17 PM   Radiology No results found.  Procedures Procedures (including critical care time)  Medications  Ordered in ED Medications - No data to display   Initial Impression / Assessment and Plan / ED Course  I have reviewed the triage vital signs and the nursing notes.  Pertinent labs & imaging results that were available during my care of the patient were reviewed by me and considered in my medical decision making (see chart for details).     BP (!) 108/55   Pulse 82   Temp 99 F (37.2 C) (Oral)   Resp 18   Ht _0  (1.549 m)   Wt 78.9 kg   SpO2 92%   BMI 32.88 kg/m    Final Clinical Impressions(s) / ED Diagnoses   Final diagnoses:  None    ED Discharge Orders    None     3:20 PM Patient here with her primary complaints of shortness of breath in setting of CHF.  Suspect CHF exacerbation causing her symptoms. Last Echo in 11/18 showed EF 55%.  Home O2 at 3L but suppose to be on 4L but she does not have a concentrator for her O2 tank.  Her primary concern is SOB.  Does have hx of Lung CA therefore risk of PE increases.  If CXF and BNP unremarkable, may consider PE work up.  Care discussed with Dr. Eulis Foster who will take over the care of this patient.     Domenic Moras, PA-C 03/11/18 1605    Daleen Bo, MD 03/24/18 [phone]

## 2018-03-11 NOTE — ED Provider Notes (Addendum)
  Face-to-face evaluation   History: She presents for evaluation of shortness of breath which started several days ago, and is persistent despite oozing nasal cannula oxygen which is usual for her.  She states her oxygen saturations tend to run in the high 80s, secondary to her pulmonary fibrosis.  She denies chest pain, productive cough, weakness or dizziness.  Physical exam: Elderly, alert, cooperative.  Lungs clear without wheezing rales or rhonchi.  Fair air movement bilaterally.  Heart regular rate and rhythm without murmur.  Legs no swelling or calf tenderness.    No data found. Clinical Course as of Mar 24 1604  Thu Mar 11, 2018  1611 I-stat troponin, ED [EW]  1611 Normal   [EW]  1612 High  CBG monitoring, ED(!) [EW]  1612 Normal except hemoglobin low   [EW]  1612 Hemoglobin trending down from 7 months ago   [EW]  1613 Normal except potassium low, glucose high, BUN high, creatinine high, calcium low, GFR low  Basic metabolic panel(!) [EW]  5247 Metabolic panel improved from 1 month ago, persistent renal insufficiency.   [EW]  1926 Mild elevation  Brain natriuretic peptide(!) [EW]    Clinical Course User Index [EW] Daleen Bo, MD     4:05 PM Reevaluation with update and discussion. After initial assessment and treatment, an updated evaluation reveals she is comfortable and agrees to blood transfusion. Daleen Bo   Medical Decision Making: Patient presents for shortness of breath, ongoing, worsening, today associated with syncope.  Hemoglobin trending down, heme-negative GI screening.  Symptomatic anemia with history of pulmonary fibrosis will require blood transfusion.  She will need to be admitted for monitoring during this transfusion.  CRITICAL CARE-yes Performed by: Daleen Bo   9:07 PM-Consult complete with Hospitalist. Patient case explained and discussed. He agrees to admit patient for further evaluation and treatment. Call ended at 2115  .Critical  Care Performed by: Daleen Bo, MD Authorized by: Daleen Bo, MD   Critical care provider statement:    Critical care time (minutes):  35   Critical care start time:  03/11/2018 3:50 PM   Critical care end time:  03/11/2018 9:05 PM   Critical care time was exclusive of:  Separately billable procedures and treating other patients   Critical care was necessary to treat or prevent imminent or life-threatening deterioration of the following conditions:  Circulatory failure   Critical care was time spent personally by me on the following activities:  Blood draw for specimens, development of treatment plan with patient or surrogate, discussions with consultants, evaluation of patient's response to treatment, examination of patient, obtaining history from patient or surrogate, ordering and performing treatments and interventions, ordering and review of laboratory studies, pulse oximetry, re-evaluation of patient's condition, review of old charts and ordering and review of radiographic studies     Medical screening examination/treatment/procedure(s) were conducted as a shared visit with non-physician practitioner(s) and myself.  I personally evaluated the patient during the encounter    Daleen Bo, MD 03/12/18 1453    Daleen Bo, MD 03/12/18 1455    Daleen Bo, MD 03/24/18 [phone]

## 2018-03-11 NOTE — ED Notes (Signed)
Pt unable to void at this time, aware of need for sample.

## 2018-03-11 NOTE — ED Notes (Signed)
CBG 174

## 2018-03-11 NOTE — ED Notes (Signed)
Pt tolerating ginger ale well

## 2018-03-11 NOTE — ED Triage Notes (Signed)
Pt from home with ems for n/v/cp headache and now a syncopal episode. Pt Denies any complaints upon arrival to ED but after transfer from ems stretcher to ED stretcher pt had another syncopal episode lasting about 30 seconds and 1 episode of light brown emesis. Pt had first syncopal episode with ems while ambulating to ems stretcher lasting about 1-3 mins. Pt always on 3L [place] but was satting 85-88% upon ems arrival, pt now on 4L [place]. Pt given 272m saline PTA. Pt now a.o and has no complaints.  BP 106/60 HR80 LBBB, CBG219

## 2018-03-11 NOTE — ED Notes (Signed)
Pt given diet ginger ale.

## 2018-03-11 NOTE — ED Notes (Signed)
Orthostatic vs obtained. Pt was able to stand but c/o dizziness. Pt was able to stand to use bedside toilet. Pt's O2 monitor keeps reading high 80's% pt states that is her normal at home. Pt's O2 remains at [facility]

## 2018-03-11 NOTE — Progress Notes (Signed)
Pharmacy Antibiotic Note  Amy Shepard is a 72 y.o. female admitted on 03/11/2018 with bronchitis .  Pharmacy has been consulted for Levaquin dosing. WBC WNL. Noted renal dysfunction requiring dosage adjustment.   Plan: Levaquin 500 mg IV q48h Trend WBC, temp, renal function  F/U infectious work-up  Height: 5' 1" (154.9 cm) Weight: 174 lb (78.9 kg) IBW/kg (Calculated) : 47.8  Temp (24hrs), Avg:98.6 F (37 C), Min:98.2 F (36.8 C), Max:99 F (37.2 C)  Recent Labs  Lab 03/11/18 1431  WBC 8.8  CREATININE 1.78*    Estimated Creatinine Clearance: 27.5 mL/min (A) (by C-G formula based on SCr of 1.78 mg/dL (H)).    Allergies  Allergen Reactions  . Amoxicillin Anaphylaxis, Hives, Rash and Other (See Comments)    Has patient had a PCN reaction causing immediate rash, facial/tongue/throat swelling, SOB or lightheadedness with hypotension: YES Positive reaction causing SEVERE RASH INVOLVING MUCUS MEMBRANES/SKIN NECROSIS: YES Reaction that required HOSPITALIZATION: YES Reaction occurring within the last 10 years: NO    Narda Bonds 03/11/2018 11:22 PM

## 2018-03-12 ENCOUNTER — Encounter (HOSPITAL_COMMUNITY): Payer: Self-pay | Admitting: *Deleted

## 2018-03-12 DIAGNOSIS — D631 Anemia in chronic kidney disease: Secondary | ICD-10-CM

## 2018-03-12 DIAGNOSIS — I272 Pulmonary hypertension, unspecified: Secondary | ICD-10-CM | POA: Diagnosis not present

## 2018-03-12 DIAGNOSIS — J209 Acute bronchitis, unspecified: Secondary | ICD-10-CM | POA: Diagnosis not present

## 2018-03-12 DIAGNOSIS — E118 Type 2 diabetes mellitus with unspecified complications: Secondary | ICD-10-CM

## 2018-03-12 DIAGNOSIS — Z794 Long term (current) use of insulin: Secondary | ICD-10-CM

## 2018-03-12 DIAGNOSIS — N184 Chronic kidney disease, stage 4 (severe): Secondary | ICD-10-CM

## 2018-03-12 LAB — CBC
HEMATOCRIT: 29.2 % — AB (ref 36.0–46.0)
Hemoglobin: 9.2 g/dL — ABNORMAL LOW (ref 12.0–15.0)
MCH: 28 pg (ref 26.0–34.0)
MCHC: 31.5 g/dL (ref 30.0–36.0)
MCV: 89 fL (ref 78.0–100.0)
Platelets: 178 10*3/uL (ref 150–400)
RBC: 3.28 MIL/uL — ABNORMAL LOW (ref 3.87–5.11)
RDW: 14.8 % (ref 11.5–15.5)
WBC: 8.3 10*3/uL (ref 4.0–10.5)

## 2018-03-12 LAB — BASIC METABOLIC PANEL
Anion gap: 7 (ref 5–15)
BUN: 34 mg/dL — ABNORMAL HIGH (ref 8–23)
CHLORIDE: 101 mmol/L (ref 98–111)
CO2: 31 mmol/L (ref 22–32)
Calcium: 8.8 mg/dL — ABNORMAL LOW (ref 8.9–10.3)
Creatinine, Ser: 1.51 mg/dL — ABNORMAL HIGH (ref 0.44–1.00)
GFR calc Af Amer: 39 mL/min — ABNORMAL LOW (ref >60)
GFR calc non Af Amer: 34 mL/min — ABNORMAL LOW (ref >60)
GLUCOSE: 145 mg/dL — AB (ref 70–99)
POTASSIUM: 3.7 mmol/L (ref 3.5–5.1)
SODIUM: 139 mmol/L (ref 135–145)

## 2018-03-12 LAB — GLUCOSE, CAPILLARY
GLUCOSE-CAPILLARY: 145 mg/dL — AB (ref 70–99)
GLUCOSE-CAPILLARY: 189 mg/dL — AB (ref 70–99)
Glucose-Capillary: 196 mg/dL — ABNORMAL HIGH (ref 70–99)
Glucose-Capillary: 176 mg/dL — ABNORMAL HIGH (ref 70–99)

## 2018-03-12 LAB — RETICULOCYTES
RBC.: 3.26 MIL/uL — ABNORMAL LOW (ref 3.87–5.11)
RETIC COUNT ABSOLUTE: 94.5 10*3/uL (ref 19.0–186.0)
Retic Ct Pct: 2.9 % (ref 0.4–3.1)

## 2018-03-12 LAB — FERRITIN: Ferritin: 19 ng/mL (ref 11–307)

## 2018-03-12 LAB — VITAMIN B12: Vitamin B-12: 231 pg/mL (ref 180–914)

## 2018-03-12 LAB — FOLATE: Folate: 11 ng/mL (ref >5.9)

## 2018-03-12 MED ORDER — ORAL CARE MOUTH RINSE
15.0000 mL | Freq: Two times a day (BID) | OROMUCOSAL | Status: DC
  Administered 2018-03-13: 15 mL via OROMUCOSAL

## 2018-03-12 MED ORDER — VITAMIN B-12 100 MCG PO TABS
500.0000 ug | ORAL_TABLET | Freq: Every day | ORAL | Status: DC
  Administered 2018-03-12 – 2018-03-13 (×2): 500 ug via ORAL
  Filled 2018-03-12 (×2): qty 5

## 2018-03-12 NOTE — Progress Notes (Signed)
Progress Note    Amy Shepard  SWH:[id number] DOB: 1946-03-22  DOA: 03/11/2018 PCP: Maurice Small, MD    Brief Narrative:     Medical records reviewed and are as summarized below:  Amy Shepard is an 72 y.o. female  w/ hx of pulm fibrosis and COPD, diast CHF, home oxygen, pulm HTN presenting w/ fatigue/ DOE for last 1 week. Started harsh cough x 2-3 days, nonprod.  N/V x 1 this am, no abd pain and no urinary c/o's. No severe SOB.  Ankles usually swell w/ CHF, none now. In ED Hb was 7.9, down to 10- 11 6 mos ago.  Asked to see for admission.    FOB neg in ED.  Pt denies fevers, was given Zpak for her cough but hadn't started it yet.  Lives by herself in an apt in [place].  No tobacco/ etoh.  Walks w/ a walker at baseline no more than across the room.  Reportedly passed out in ED 2 x, pt doesn' t remember.    Assessment/Plan:   Principal Problem:   Anemia due to stage 4 chronic kidney disease ([name]) Active Problems:   Chronic obstructive pulmonary disease ([name])   Type 2 diabetes mellitus with complication, with long-term current use of insulin ([name])   HTN (hypertension)   Chronic diastolic CHF (congestive heart failure) ([name])   Cor pulmonale ([name])   Chronic respiratory failure with hypoxia ([name])   Bronchitis, acute   Fatigue   Pulmonary fibrosis ([name])   Pulmonary HTN ([name])   Anemia  Fatigue / anemia - normocytic anemia it pt w/ lung disease/ pulm fibrosis and pulm HTN S/p PRBC -PT Eval- check orthostatics  Hist of chron diast CHF  -given IV lasix after 2u prbcs  Cough / acute bronchitis- harsh cough, possible bronchitis vs CHF, no infiltrate on CXR.  Severe PCN allergy, use levaquin.    Syncope  - suspect syncope due to coughing spells and pulm HTN /vasodilators on board  Chron resp failure/ pulm fibrosis  - cont O2 at 3 lpm, cont meds  CKD stage IV -  -baseline creat 1.8- 2.2  DM2  - cont home levemir  -add SSI  Pulm HTN -  -opsumit  HTN - hold  acei for 1 -2 days    obesity Body mass index is 33.57 kg/m.   Family Communication/Anticipated D/C date and plan/Code Status   DVT prophylaxis: Lovenox ordered. Code Status: Full Code.  Family Communication: none at bedside Disposition Plan: PT eval and home if improved   Medical Consultants:     Subjective:   Still c/o dizziness, no calf pain  Objective:    Vitals:   03/12/18 0557 03/12/18 0602 03/12/18 0835 03/12/18 1011  BP: (!) 107/56   (!) 108/59  Pulse: 81 84  79  Resp: 18   18  Temp: 98.6 F (37 C)   97.9 F (36.6 C)  TempSrc: Oral   Oral  SpO2: (!) 81% 91% 92% (!) 72%  Weight:      Height:        Intake/Output Summary (Last 24 hours) at 03/12/2018 1600 Last data filed at 03/12/2018 1300 Gross per 24 hour  Intake 1308.71 ml  Output 1250 ml  Net 58.71 ml   Filed Weights   03/11/18 1408 03/11/18 2333  Weight: 78.9 kg 80.6 kg    Exam: In bed, chronically ill appearing on O2 Crackles through out rrr No LE edema, no redness, no swelling +BS, soft  Data Reviewed:   I have personally reviewed following labs and imaging studies:  Labs: Labs show the following:   Basic Metabolic Panel: Recent Labs  Lab 03/11/18 1431 03/12/18 0431  NA 140 139  K 3.4* 3.7  CL 99 101  CO2 29 31  GLUCOSE 195* 145*  BUN 40* 34*  CREATININE 1.78* 1.51*  CALCIUM 8.5* 8.8*   GFR Estimated Creatinine Clearance: 32.9 mL/min (A) (by C-G formula based on SCr of 1.51 mg/dL (H)). Liver Function Tests: No results for input(s): AST, ALT, ALKPHOS, BILITOT, PROT, ALBUMIN in the last 168 hours. No results for input(s): LIPASE, AMYLASE in the last 168 hours. No results for input(s): AMMONIA in the last 168 hours. Coagulation profile No results for input(s): INR, PROTIME in the last 168 hours.  CBC: Recent Labs  Lab 03/11/18 1431 03/12/18 0431  WBC 8.8 8.3  HGB 7.9* 9.2*  HCT 25.8* 29.2*  MCV 88.7 89.0  PLT 172 178   Cardiac Enzymes: No results for  input(s): CKTOTAL, CKMB, CKMBINDEX, TROPONINI in the last 168 hours. BNP (last 3 results) No results for input(s): PROBNP in the last 8760 hours. CBG: Recent Labs  Lab 03/11/18 1430 03/11/18 2327 03/12/18 0752 03/12/18 1205  GLUCAP 174* 182* 145* 196*   D-Dimer: No results for input(s): DDIMER in the last 72 hours. Hgb A1c: No results for input(s): HGBA1C in the last 72 hours. Lipid Profile: No results for input(s): CHOL, HDL, LDLCALC, TRIG, CHOLHDL, LDLDIRECT in the last 72 hours. Thyroid function studies: No results for input(s): TSH, T4TOTAL, T3FREE, THYROIDAB in the last 72 hours.  Invalid input(s): FREET3 Anemia work up: Recent Labs    03/11/18 2130 03/12/18 0431  VITAMINB12 231  --   FOLATE 11.0  --   FERRITIN 19  17  --   TIBC 435  --   IRON 23*  --   RETICCTPCT  --  2.9   Sepsis Labs: Recent Labs  Lab 03/11/18 1431 03/12/18 0431  WBC 8.8 8.3    Microbiology No results found for this or any previous visit (from the past 240 hour(s)).  Procedures and diagnostic studies:  Dg Chest 2 View  Result Date: 03/11/2018 CLINICAL DATA:  Chest discomfort and nausea. History of lung cancer and COPD, CHF. EXAM: CHEST - 2 VIEW COMPARISON:  Chest radiograph January 29, 2018 FINDINGS: Stable cardiomegaly. Calcified aortic knob. Chronic interstitial changes. Bibasilar scarring. No pleural effusion or focal consolidation. Increased lung volumes and flattened hemidiaphragms. No pneumothorax. Soft tissue planes and included osseous structures are non suspicious. Osteopenia. IMPRESSION: 1. COPD and bibasilar scarring. 2. Stable cardiomegaly. 3.  Aortic Atherosclerosis (ICD10-I70.0). Electronically Signed   By: Elon Alas M.D.   On: 03/11/2018 16:35   Ct Head Wo Contrast  Result Date: 03/11/2018 CLINICAL DATA:  Headache. History of diabetes, hypertension, spine surgery, lung cancer. EXAM: CT HEAD WITHOUT CONTRAST TECHNIQUE: Contiguous axial images were obtained from the base  of the skull through the vertex without intravenous contrast. COMPARISON:  CT HEAD June 02, 2017 FINDINGS: BRAIN: No intraparenchymal hemorrhage, mass effect nor midline shift. The ventricles and sulci are normal for age. Patchy supratentorial white matter hypodensities within normal range for patient's age, though non-specific are most compatible with chronic small vessel ischemic disease. No acute large vascular territory infarcts. No abnormal extra-axial fluid collections. Basal cisterns are patent. VASCULAR: Mild calcific atherosclerosis of the carotid siphons. SKULL: No skull fracture. No significant scalp soft tissue swelling. SINUSES/ORBITS: Trace paranasal sinus mucosal thickening. Mastoid air  cells are well aerated.The included ocular globes and orbital contents are non-suspicious. Status post bilateral ocular lens implants. OTHER: None. IMPRESSION: Negative CT HEAD for age. Electronically Signed   By: Elon Alas M.D.   On: 03/11/2018 16:55    Medications:   . aspirin EC  81 mg Oral Daily  . cholecalciferol  2,000 Units Oral Daily  . enoxaparin (LOVENOX) injection  30 mg Subcutaneous Daily  . furosemide  60 mg Intravenous BID  . gabapentin  300 mg Oral BID  . insulin aspart  0-15 Units Subcutaneous TID WC  . insulin aspart  0-5 Units Subcutaneous QHS  . insulin detemir  36 Units Subcutaneous QHS  . ipratropium-albuterol  3 mL Nebulization TID  . macitentan  10 mg Oral Daily  . mouth rinse  15 mL Mouth Rinse BID  . mometasone-formoterol  2 puff Inhalation BID  . rosuvastatin  40 mg Oral q1800  . sertraline  150 mg Oral Daily  . sodium chloride flush  3 mL Intravenous Q12H  . vitamin B-12  500 mcg Oral Daily   Continuous Infusions: . sodium chloride Stopped (03/12/18 0257)  . levofloxacin (LEVAQUIN) IV Stopped (03/12/18 0257)     LOS: 0 days   Geradine Girt  [facility]   *Please refer to [email or web address], password [email or web address] to get updated schedule on who will round on  this patient, as hospitalists switch teams weekly. If 7PM-7AM, please contact night-coverage at [email or web address], password [email or web address] for any overnight needs.  03/12/2018, 4:00 PM

## 2018-03-12 NOTE — Progress Notes (Signed)
New Admission Note:   Arrival Method: Via stretcher from [facility] ED Mental Orientation: Alert and oriented x4 Telemetry: Box #4 Assessment: Completed Skin: See doc flowsheet IV: Rt FA Pain: Denies Tubes: N/A Safety Measures: Safety Fall Prevention Plan has been discussed. Admission: Completed 100M Orientation: Patient has been orientated to the room, unit and staff.  Family: None at bedside  Orders have been reviewed and implemented. Will continue to monitor the patient. Call light has been placed within reach and bed alarm has been activated.   Gracee Ratterree [employer], RN-BC Phone number: [phone]

## 2018-03-12 NOTE — Evaluation (Signed)
Physical Therapy Evaluation Patient Details Name: Amy Shepard MRN: [id number] DOB: 1946-01-28 Today's Date: 03/12/2018   History of Present Illness  Pt is a 72 y.o. F with significant PMH of pulmonary fibrosis, COPD, diastolic CHF, pulmonary HTN who presents with progressive fatigue and DOE for last week. On 3L O2 at home.  Clinical Impression  At baseline, patient is mostly a household ambulator with use of Rollator. On PT evaluation, patient is presenting with decreased functional mobility secondary to decreased endurance. Ambulating 80 feet with walker and supervision. Prior to mobility, pt SpO2 88% on 3L O2, decreased to 70's with ambulation, PT increased oxygen to 5L O2, and  SpO2 returned to baseline after ~2 minute seated rest break. Pt baseline SpO2 is high 80's on 3L O2. Will continue to follow acutely to provide energy conservation strategies and progress mobility.     Follow Up Recommendations Home health PT    Equipment Recommendations  None recommended by PT    Recommendations for Other Services       Precautions / Restrictions Precautions Precautions: Fall Precaution Comments: watch O2 Restrictions Weight Bearing Restrictions: No      Mobility  Bed Mobility Overal bed mobility: Modified Independent                Transfers Overall transfer level: Needs assistance Equipment used: Rolling walker (2 wheeled) Transfers: Sit to/from Stand Sit to Stand: Supervision            Ambulation/Gait Ambulation/Gait assistance: Supervision Gait Distance (Feet): 80 Feet Assistive device: Rolling walker (2 wheeled) Gait Pattern/deviations: Step-through pattern;Decreased stride length;Trunk flexed Gait velocity: decreased   General Gait Details: patient fatigues easily and rests forearms on walker after ~60 feet for rest break. cues for self pacing  Stairs            Wheelchair Mobility    Modified Rankin (Stroke Patients Only)        Balance Overall balance assessment: Mild deficits observed, not formally tested                                           Pertinent Vitals/Pain Pain Assessment: No/denies pain    Home Living Family/patient expects to be discharged to:: Private residence Living Arrangements: Alone Available Help at Discharge: Family;Available PRN/intermittently Type of Home: Apartment Home Access: Level entry     Home Layout: One level Home Equipment: Walker - 4 wheels;Shower seat      Prior Function Level of Independence: Independent with assistive device(s)         Comments: uses Rollator, indep with ADL's, drives, shops     Hand Dominance   Dominant Hand: Right    Extremity/Trunk Assessment   Upper Extremity Assessment Upper Extremity Assessment: Overall WFL for tasks assessed    Lower Extremity Assessment Lower Extremity Assessment: Overall WFL for tasks assessed       Communication   Communication: No difficulties  Cognition Arousal/Alertness: Awake/alert Behavior During Therapy: WFL for tasks assessed/performed Overall Cognitive Status: Within Functional Limits for tasks assessed                                        General Comments      Exercises     Assessment/Plan    PT Assessment Patient needs continued  PT services  PT Problem List Decreased strength;Decreased activity tolerance;Decreased mobility;Cardiopulmonary status limiting activity       PT Treatment Interventions DME instruction;Gait training;Functional mobility training;Therapeutic activities;Therapeutic exercise;Balance training;Patient/family education    PT Goals (Current goals can be found in the Care Plan section)  Acute Rehab PT Goals Patient Stated Goal: wants to walk PT Goal Formulation: With patient Time For Goal Achievement: 03/26/18 Potential to Achieve Goals: Good    Frequency Min 3X/week   Barriers to discharge        Co-evaluation                AM-PAC PT "6 Clicks" Daily Activity  Outcome Measure Difficulty turning over in bed (including adjusting bedclothes, sheets and blankets)?: None Difficulty moving from lying on back to sitting on the side of the bed? : None Difficulty sitting down on and standing up from a chair with arms (e.g., wheelchair, bedside commode, etc,.)?: None Help needed moving to and from a bed to chair (including a wheelchair)?: None Help needed walking in hospital room?: A Little Help needed climbing 3-5 steps with a railing? : A Lot 6 Click Score: 21    End of Session Equipment Utilized During Treatment: Gait belt;Oxygen Activity Tolerance: Patient tolerated treatment well Patient left: in chair;with call bell/phone within reach Nurse Communication: Mobility status PT Visit Diagnosis: Difficulty in walking, not elsewhere classified (R26.2)    Time: 6195-0932 PT Time Calculation (min) (ACUTE ONLY): 24 min   Charges:   PT Evaluation $PT Eval Moderate Complexity: 1 Mod PT Treatments $Therapeutic Activity: 8-22 mins      Ellamae Sia, PT, DPT Acute Rehabilitation Services  Pager: [place] 03/12/2018, 4:17 PM

## 2018-03-12 NOTE — Telephone Encounter (Signed)
Patient admitted to hospital.

## 2018-03-13 DIAGNOSIS — N184 Chronic kidney disease, stage 4 (severe): Secondary | ICD-10-CM | POA: Diagnosis not present

## 2018-03-13 DIAGNOSIS — J9611 Chronic respiratory failure with hypoxia: Secondary | ICD-10-CM

## 2018-03-13 DIAGNOSIS — R5383 Other fatigue: Secondary | ICD-10-CM | POA: Diagnosis not present

## 2018-03-13 DIAGNOSIS — E118 Type 2 diabetes mellitus with unspecified complications: Secondary | ICD-10-CM | POA: Diagnosis not present

## 2018-03-13 LAB — BASIC METABOLIC PANEL
ANION GAP: 8 (ref 5–15)
BUN: 33 mg/dL — ABNORMAL HIGH (ref 8–23)
CALCIUM: 9.1 mg/dL (ref 8.9–10.3)
CHLORIDE: 99 mmol/L (ref 98–111)
CO2: 33 mmol/L — AB (ref 22–32)
CREATININE: 1.67 mg/dL — AB (ref 0.44–1.00)
GFR calc non Af Amer: 30 mL/min — ABNORMAL LOW (ref >60)
GFR, EST AFRICAN AMERICAN: 34 mL/min — AB (ref >60)
Glucose, Bld: 154 mg/dL — ABNORMAL HIGH (ref 70–99)
Potassium: 3.4 mmol/L — ABNORMAL LOW (ref 3.5–5.1)
SODIUM: 140 mmol/L (ref 135–145)

## 2018-03-13 LAB — GLUCOSE, CAPILLARY
GLUCOSE-CAPILLARY: 145 mg/dL — AB (ref 70–99)
GLUCOSE-CAPILLARY: 179 mg/dL — AB (ref 70–99)

## 2018-03-13 LAB — CBC
HEMATOCRIT: 29.5 % — AB (ref 36.0–46.0)
HEMOGLOBIN: 9.2 g/dL — AB (ref 12.0–15.0)
MCH: 28 pg (ref 26.0–34.0)
MCHC: 31.2 g/dL (ref 30.0–36.0)
MCV: 89.7 fL (ref 78.0–100.0)
Platelets: 172 10*3/uL (ref 150–400)
RBC: 3.29 MIL/uL — ABNORMAL LOW (ref 3.87–5.11)
RDW: 15.2 % (ref 11.5–15.5)
WBC: 7.8 10*3/uL (ref 4.0–10.5)

## 2018-03-13 MED ORDER — IPRATROPIUM-ALBUTEROL 0.5-2.5 (3) MG/3ML IN SOLN: 3.0000 mL | RESPIRATORY_TRACT | Status: DC | PRN

## 2018-03-13 MED ORDER — IPRATROPIUM-ALBUTEROL 0.5-2.5 (3) MG/3ML IN SOLN: 3.0000 mL | Freq: Two times a day (BID) | RESPIRATORY_TRACT | Status: DC

## 2018-03-13 NOTE — Discharge Summary (Addendum)
Physician Discharge Summary  Amy Shepard JJO:[id number] DOB: 09/04/1945 DOA: 03/11/2018  PCP: Maurice Small, MD  Admit date: 03/11/2018 Discharge date: 03/13/2018  Time spent: 65 minutes  Recommendations for Outpatient Follow-up:  1. Follow up with Dr Aundra Dubin cardiology 1-2 weeks for evaluation of volume satus and adjustments of medications and resumption of ace 2. Follow up with PCP 1-2 weeks for evaluation of Hgb 3. Home health PT-- needs O2 24/7   Discharge Diagnoses:  Principal Problem:   Anemia due to stage 4 chronic kidney disease ([name]) Active Problems:   Chronic obstructive pulmonary disease ([name])   Type 2 diabetes mellitus with complication, with long-term current use of insulin ([name])   HTN (hypertension)   Chronic diastolic CHF (congestive heart failure) ([name])   Cor pulmonale ([name])   Chronic respiratory failure with hypoxia ([name])   Bronchitis, acute   Fatigue   Pulmonary fibrosis ([place])   Pulmonary HTN ([place])   Anemia   Discharge Condition: stable  Diet recommendation: heart healthy/carb modified  Filed Weights   03/11/18 1408 03/11/18 2333  Weight: 78.9 kg 80.6 kg    History of present illness:  Amy Shepard is an 72 y.o. female w/ hx ofpulm fibrosis and COPD, diast CHF, homeoxygen, pulm HTN presented to ed 8/29 w/ fatigue/ DOE for last 1 week. Started harsh cough x 2-3 days, nonprod. N/V x 1 this am, no abd pain and no urinary c/o's. No severe SOB. Ankles usually swell w/ CHF, none now. In ED Hb was 7.9, down to 10- 11 6 mos ago. Asked to see for admission.   Hospital Course:  Fatigue / anemia - normocytic anemia it pt w/ lung disease/ pulm fibrosis and pulm HTN. Received 2 unit PRBC. Hg 9.2 at discharge up from 7.9 and patient is feeling much better  Acute on chron diast CHF. Compensated at discharge. recieved IV lasix after 2u prbcs. Follow up with dr Aundra Dubin cardiology-- down close to 4L at d/c  Cough / acute bronchitis- harsh cough,  possible bronchitis vs CHF, no infiltrate on CXR. zpack at discharge   Syncope  - suspect syncope due to coughing spells and pulm HTN /vasodilators on board. VSS not orthostatic. No s/sx infection. No metabolic derangements.   Chron resp failure/ pulm fibrosis  - cont O2 at 3 lpm, cont meds  CKD stage IV -  -baseline creat 1.8- 2.2  DM2  - cont home levemir   Pulm HTN -  -opsumit    Procedures:  Transfusion 2 units PRBC's     Discharge Exam: Vitals:   03/13/18 0737 03/13/18 0753  BP: (!) 110/48   Pulse: 65   Resp: 16   Temp: 98.2 F (36.8 C)   SpO2: (!) 81% 94%    General: obese lying in bed in no acute distress Cardiovascular: rrr no MGR trace LE edema Respiratory: BS distant and coarse.   Discharge Instructions   Discharge Instructions    (HEART FAILURE PATIENTS) Call MD:  Anytime you have any of the following symptoms: 1) 3 pound weight gain in 24 hours or 5 pounds in 1 week 2) shortness of breath, with or without a dry hacking cough 3) swelling in the hands, feet or stomach 4) if you have to sleep on extra pillows at night in order to breathe.   Complete by:  As directed    Call MD for:  difficulty breathing, headache or visual disturbances   Complete by:  As directed    Call MD for:  persistant dizziness or light-headedness   Complete by:  As directed    Diet - low sodium heart healthy   Complete by:  As directed    Increase activity slowly   Complete by:  As directed      Allergies as of 03/13/2018      Reactions   Amoxicillin Anaphylaxis, Hives, Rash, Other (See Comments)   Has patient had a PCN reaction causing immediate rash, facial/tongue/throat swelling, SOB or lightheadedness with hypotension: YES Positive reaction causing SEVERE RASH INVOLVING MUCUS MEMBRANES/SKIN NECROSIS: YES Reaction that required HOSPITALIZATION: YES Reaction occurring within the last 10 years: NO      Medication List    STOP taking these medications    lisinopril 10 MG tablet Commonly known as:  PRINIVIL,ZESTRIL     TAKE these medications   acetaminophen 325 MG tablet Commonly known as:  TYLENOL Take 650 mg by mouth every 6 (six) hours as needed for moderate pain or fever.   aspirin EC 81 MG tablet Take 81 mg by mouth daily.   azithromycin 250 MG tablet Commonly known as:  ZITHROMAX Take 250 mg by mouth as directed.   Fluticasone-Salmeterol 250-50 MCG/DOSE Aepb Commonly known as:  ADVAIR Inhale 1 puff into the lungs 2 (two) times daily as needed (shortness of breath/wheezing).   furosemide 40 MG tablet Commonly known as:  LASIX TAKE 1 TABLET BY MOUTH TWICE A DAY What changed:  when to take this   gabapentin 300 MG capsule Commonly known as:  NEURONTIN Take 300 mg by mouth 3 (three) times daily.   guaiFENesin 600 MG 12 hr tablet Commonly known as:  MUCINEX Take 1 tablet (600 mg total) by mouth 2 (two) times daily as needed for cough or to loosen phlegm.   insulin lispro 100 UNIT/ML injection Commonly known as:  HUMALOG Inject 2-11 Units into the skin See admin instructions. Inject 6 units with each meal and add additional units per sliding scale: BGL 101-150 = 2 units; 151-200 = 3 units; 201-250 = 5 units; 251-300 = 7 units; 301-350 = 9 units; >350 = 11 units; CALL MD IF BGL IS >400 OR <60   ipratropium-albuterol 0.5-2.5 (3) MG/3ML Soln Commonly known as:  DUONEB Take 3 mLs by nebulization every 4 (four) hours as needed (wheezing, Shortness of breath).   LEVEMIR FLEXTOUCH 100 UNIT/ML Pen Generic drug:  Insulin Detemir Inject 36 Units into the skin at bedtime.   macitentan 10 MG tablet Commonly known as:  OPSUMIT Take 1 tablet (10 mg total) by mouth daily.   OXYGEN Inhale 3 L into the lungs continuous. FOR COPD   potassium chloride SA 20 MEQ tablet Commonly known as:  K-DUR,KLOR-CON Take 40 meq (2 tabs) in AM, then 20 meq (1 tab) in PM   REFRESH CELLUVISC 1 % Gel Generic drug:  Carboxymethylcellulose Sod  PF Place 1 drop into both eyes 2 (two) times daily as needed.   rosuvastatin 40 MG tablet Commonly known as:  CRESTOR Take 1 tablet (40 mg total) by mouth daily at 6 PM.   sertraline 50 MG tablet Commonly known as:  ZOLOFT Take 75 mg by mouth daily.   spironolactone 25 MG tablet Commonly known as:  ALDACTONE TAKE 1/2 TABLET ONCE A DAY What changed:  See the new instructions.   traMADol 50 MG tablet Commonly known as:  ULTRAM Take 1 tablet (50 mg total) by mouth every 8 (eight) hours as needed (for pain).   TRULICITY 7.82 NF/6.2ZH Sopn Generic drug:  Dulaglutide Inject 0.5 mLs as directed every Sunday.   Vitamin D 2000 units Caps Take 2,000 Units by mouth daily.      Allergies  Allergen Reactions  . Amoxicillin Anaphylaxis, Hives, Rash and Other (See Comments)    Has patient had a PCN reaction causing immediate rash, facial/tongue/throat swelling, SOB or lightheadedness with hypotension: YES Positive reaction causing SEVERE RASH INVOLVING MUCUS MEMBRANES/SKIN NECROSIS: YES Reaction that required HOSPITALIZATION: YES Reaction occurring within the last 10 years: NO      The results of significant diagnostics from this hospitalization (including imaging, microbiology, ancillary and laboratory) are listed below for reference.    Significant Diagnostic Studies: Dg Chest 2 View  Result Date: 03/11/2018 CLINICAL DATA:  Chest discomfort and nausea. History of lung cancer and COPD, CHF. EXAM: CHEST - 2 VIEW COMPARISON:  Chest radiograph January 29, 2018 FINDINGS: Stable cardiomegaly. Calcified aortic knob. Chronic interstitial changes. Bibasilar scarring. No pleural effusion or focal consolidation. Increased lung volumes and flattened hemidiaphragms. No pneumothorax. Soft tissue planes and included osseous structures are non suspicious. Osteopenia. IMPRESSION: 1. COPD and bibasilar scarring. 2. Stable cardiomegaly. 3.  Aortic Atherosclerosis (ICD10-I70.0). Electronically Signed   By:  Elon Alas M.D.   On: 03/11/2018 16:35   Ct Head Wo Contrast  Result Date: 03/11/2018 CLINICAL DATA:  Headache. History of diabetes, hypertension, spine surgery, lung cancer. EXAM: CT HEAD WITHOUT CONTRAST TECHNIQUE: Contiguous axial images were obtained from the base of the skull through the vertex without intravenous contrast. COMPARISON:  CT HEAD June 02, 2017 FINDINGS: BRAIN: No intraparenchymal hemorrhage, mass effect nor midline shift. The ventricles and sulci are normal for age. Patchy supratentorial white matter hypodensities within normal range for patient's age, though non-specific are most compatible with chronic small vessel ischemic disease. No acute large vascular territory infarcts. No abnormal extra-axial fluid collections. Basal cisterns are patent. VASCULAR: Mild calcific atherosclerosis of the carotid siphons. SKULL: No skull fracture. No significant scalp soft tissue swelling. SINUSES/ORBITS: Trace paranasal sinus mucosal thickening. Mastoid air cells are well aerated.The included ocular globes and orbital contents are non-suspicious. Status post bilateral ocular lens implants. OTHER: None. IMPRESSION: Negative CT HEAD for age. Electronically Signed   By: Elon Alas M.D.   On: 03/11/2018 16:55    Microbiology: Recent Results (from the past 240 hour(s))  Urine culture     Status: Abnormal (Preliminary result)   Collection Time: 03/11/18  7:29 PM  Result Value Ref Range Status   Specimen Description URINE, CLEAN CATCH  Final   Special Requests   Final    NONE Performed at [facility], [place] [address], [place], [place] [address]    Culture >=100,000 COLONIES/mL ESCHERICHIA COLI (A)  Final   Report Status PENDING  Incomplete     Labs: Basic Metabolic Panel: Recent Labs  Lab 03/11/18 1431 03/12/18 0431 03/13/18 0624  NA 140 139 140  K 3.4* 3.7 3.4*  CL 99 101 99  CO2 29 31 33*  GLUCOSE 195* 145* 154*  BUN 40* 34* 33*  CREATININE 1.78* 1.51*  1.67*  CALCIUM 8.5* 8.8* 9.1   Liver Function Tests: No results for input(s): AST, ALT, ALKPHOS, BILITOT, PROT, ALBUMIN in the last 168 hours. No results for input(s): LIPASE, AMYLASE in the last 168 hours. No results for input(s): AMMONIA in the last 168 hours. CBC: Recent Labs  Lab 03/11/18 1431 03/12/18 0431 03/13/18 0624  WBC 8.8 8.3 7.8  HGB 7.9* 9.2* 9.2*  HCT 25.8* 29.2* 29.5*  MCV  88.7 89.0 89.7  PLT 172 178 172   Cardiac Enzymes: No results for input(s): CKTOTAL, CKMB, CKMBINDEX, TROPONINI in the last 168 hours. BNP: BNP (last 3 results) Recent Labs    05/02/17 0756 06/02/17 1927 03/11/18 1526  BNP 1,284.3* 337.7* 246.9*    ProBNP (last 3 results) No results for input(s): PROBNP in the last 8760 hours.  CBG: Recent Labs  Lab 03/12/18 1205 03/12/18 1657 03/12/18 2151 03/13/18 0739 03/13/18 1136  GLUCAP 196* 189* 176* 145* 179*       Signed:  Eulogio Bear DO [facility] 03/13/2018, 2:47 PM

## 2018-03-13 NOTE — Progress Notes (Signed)
Patient discharged home per MD. Arrangements made for portable O2 tank, roller walker, and cab voucher per Education officer, museum. Patient called Residential Office to verify access to her apartment keys. [place] notified for transport. Patient escorted downstairs to main entrance via wheelchair. Bartholomew Crews, RN

## 2018-03-13 NOTE — Care Management Note (Signed)
Case Management Note  Patient Details  Name: Amy Shepard MRN: [id number] Date of Birth: [birth year]  Subjective/Objective:                    Action/Plan:  Spoke w patient at the bedside. Patient has O2 through [facility]. She needs a portable tank for transportation and a RW. Reggie w [facility] will bring these to room prior to DC. Notified CSW that patient will need a cab voucher. Patient is able to get from cab to front door is able to sit in chair and ultimately would pay out of pocket for PTAR transport. After discussing this with her she is agreeable to use a cab voucher.  Referral made to [facility] for [facility] PT.    Expected Discharge Date:  03/13/18               Expected Discharge Plan:  [place]  In-House Referral:     Discharge planning Services  CM Consult  Post Acute Care Choice:  Home Health, Durable Medical Equipment Choice offered to:  Patient  DME Arranged:  Oxygen, Walker rolling DME Agency:  [place]:  PT [place]:  [place]  Status of Service:  Completed, signed off  If discussed at [place] of Stay Meetings, dates discussed:    Additional Comments:  Carles Collet, RN 03/13/2018, 2:55 PM

## 2018-03-14 LAB — URINE CULTURE

## 2018-03-15 LAB — TYPE AND SCREEN
ABO/RH(D): B NEG
Antibody Screen: NEGATIVE
UNIT DIVISION: 0
UNIT DIVISION: 0

## 2018-03-15 LAB — BPAM RBC
BLOOD PRODUCT EXPIRATION DATE: 201909082359
Blood Product Expiration Date: 201909122359
ISSUE DATE / TIME: 201908292227
ISSUE DATE / TIME: 201908031859
UNIT TYPE AND RH: 1700
Unit Type and Rh: 1700

## 2018-03-18 ENCOUNTER — Telehealth (HOSPITAL_COMMUNITY): Payer: Self-pay | Admitting: *Deleted

## 2018-03-18 DIAGNOSIS — I5032 Chronic diastolic (congestive) heart failure: Secondary | ICD-10-CM

## 2018-03-18 DIAGNOSIS — I270 Primary pulmonary hypertension: Secondary | ICD-10-CM

## 2018-03-18 NOTE — Telephone Encounter (Signed)
Home health PT called to report they will not be able to see patient as she isn't home bound.  Patient verbalized she was ready to start Pulmonary rehab.  New order placed.

## 2018-03-19 ENCOUNTER — Telehealth (HOSPITAL_COMMUNITY): Payer: Self-pay

## 2018-03-19 NOTE — Telephone Encounter (Signed)
Attempted to call patient in regards to Pulmonary Rehab - Was not able to LM

## 2018-03-19 NOTE — Telephone Encounter (Signed)
Pt insurance is active and benefits verified through [facility]. Co-pay $20.00, DED $0.00/$0.00 met, out of pocket $4,500.00/$1,354.10 met, co-insurance 0%. No pre-authorization. Darrell/[employer], 03/19/18 @ 4:01PM, [id number]

## 2018-03-23 ENCOUNTER — Telehealth (HOSPITAL_COMMUNITY): Payer: Self-pay

## 2018-03-23 DIAGNOSIS — R0600 Dyspnea, unspecified: Secondary | ICD-10-CM

## 2018-03-23 NOTE — Telephone Encounter (Signed)
2nd attempt to contact patient - vm has not been set up. Sending letter.

## 2018-03-30 ENCOUNTER — Telehealth (HOSPITAL_COMMUNITY): Payer: Self-pay

## 2018-03-30 NOTE — Telephone Encounter (Signed)
3rd attempt to contact patient in regards to Pulmonary Rehab - vm has not been set up.

## 2018-04-09 ENCOUNTER — Emergency Department (HOSPITAL_COMMUNITY): Payer: Medicare Other

## 2018-04-09 ENCOUNTER — Observation Stay (HOSPITAL_COMMUNITY)
Admission: EM | Admit: 2018-04-09 | Discharge: 2018-04-11 | Disposition: A | Discharge status: Home / Self Care | Payer: Medicare Other | Attending: Internal Medicine | Admitting: Internal Medicine

## 2018-04-09 ENCOUNTER — Encounter (HOSPITAL_COMMUNITY): Payer: Self-pay | Admitting: Emergency Medicine

## 2018-04-09 ENCOUNTER — Other Ambulatory Visit: Payer: Self-pay

## 2018-04-09 DIAGNOSIS — R9389 Abnormal findings on diagnostic imaging of other specified body structures: Secondary | ICD-10-CM

## 2018-04-09 DIAGNOSIS — M1990 Unspecified osteoarthritis, unspecified site: Secondary | ICD-10-CM | POA: Insufficient documentation

## 2018-04-09 DIAGNOSIS — I2510 Atherosclerotic heart disease of native coronary artery without angina pectoris: Secondary | ICD-10-CM | POA: Diagnosis not present

## 2018-04-09 DIAGNOSIS — Z79899 Other long term (current) drug therapy: Secondary | ICD-10-CM | POA: Diagnosis not present

## 2018-04-09 DIAGNOSIS — M25572 Pain in left ankle and joints of left foot: Secondary | ICD-10-CM | POA: Insufficient documentation

## 2018-04-09 DIAGNOSIS — S82492A Other fracture of shaft of left fibula, initial encounter for closed fracture: Secondary | ICD-10-CM | POA: Diagnosis not present

## 2018-04-09 DIAGNOSIS — Z9981 Dependence on supplemental oxygen: Secondary | ICD-10-CM | POA: Diagnosis not present

## 2018-04-09 DIAGNOSIS — J431 Panlobular emphysema: Secondary | ICD-10-CM

## 2018-04-09 DIAGNOSIS — I130 Hypertensive heart and chronic kidney disease with heart failure and stage 1 through stage 4 chronic kidney disease, or unspecified chronic kidney disease: Secondary | ICD-10-CM | POA: Diagnosis not present

## 2018-04-09 DIAGNOSIS — M8580 Other specified disorders of bone density and structure, unspecified site: Secondary | ICD-10-CM | POA: Insufficient documentation

## 2018-04-09 DIAGNOSIS — E7800 Pure hypercholesterolemia, unspecified: Secondary | ICD-10-CM | POA: Diagnosis not present

## 2018-04-09 DIAGNOSIS — F329 Major depressive disorder, single episode, unspecified: Secondary | ICD-10-CM | POA: Insufficient documentation

## 2018-04-09 DIAGNOSIS — N184 Chronic kidney disease, stage 4 (severe): Secondary | ICD-10-CM

## 2018-04-09 DIAGNOSIS — Z87891 Personal history of nicotine dependence: Secondary | ICD-10-CM | POA: Diagnosis not present

## 2018-04-09 DIAGNOSIS — R52 Pain, unspecified: Secondary | ICD-10-CM

## 2018-04-09 DIAGNOSIS — M25579 Pain in unspecified ankle and joints of unspecified foot: Secondary | ICD-10-CM | POA: Diagnosis present

## 2018-04-09 DIAGNOSIS — I5032 Chronic diastolic (congestive) heart failure: Secondary | ICD-10-CM

## 2018-04-09 DIAGNOSIS — I272 Pulmonary hypertension, unspecified: Secondary | ICD-10-CM

## 2018-04-09 DIAGNOSIS — J9611 Chronic respiratory failure with hypoxia: Secondary | ICD-10-CM | POA: Diagnosis not present

## 2018-04-09 DIAGNOSIS — J449 Chronic obstructive pulmonary disease, unspecified: Secondary | ICD-10-CM | POA: Diagnosis not present

## 2018-04-09 DIAGNOSIS — X58XXXA Exposure to other specified factors, initial encounter: Secondary | ICD-10-CM | POA: Insufficient documentation

## 2018-04-09 DIAGNOSIS — E1122 Type 2 diabetes mellitus with diabetic chronic kidney disease: Secondary | ICD-10-CM | POA: Diagnosis not present

## 2018-04-09 DIAGNOSIS — Z7982 Long term (current) use of aspirin: Secondary | ICD-10-CM | POA: Diagnosis not present

## 2018-04-09 DIAGNOSIS — R55 Syncope and collapse: Secondary | ICD-10-CM | POA: Diagnosis not present

## 2018-04-09 DIAGNOSIS — D631 Anemia in chronic kidney disease: Secondary | ICD-10-CM

## 2018-04-09 DIAGNOSIS — S82402A Unspecified fracture of shaft of left fibula, initial encounter for closed fracture: Secondary | ICD-10-CM | POA: Diagnosis present

## 2018-04-09 DIAGNOSIS — E118 Type 2 diabetes mellitus with unspecified complications: Secondary | ICD-10-CM

## 2018-04-09 DIAGNOSIS — Z880 Allergy status to penicillin: Secondary | ICD-10-CM | POA: Diagnosis not present

## 2018-04-09 DIAGNOSIS — Z8249 Family history of ischemic heart disease and other diseases of the circulatory system: Secondary | ICD-10-CM | POA: Insufficient documentation

## 2018-04-09 DIAGNOSIS — Z809 Family history of malignant neoplasm, unspecified: Secondary | ICD-10-CM | POA: Insufficient documentation

## 2018-04-09 DIAGNOSIS — I10 Essential (primary) hypertension: Secondary | ICD-10-CM

## 2018-04-09 DIAGNOSIS — I4510 Unspecified right bundle-branch block: Secondary | ICD-10-CM | POA: Diagnosis not present

## 2018-04-09 DIAGNOSIS — Z981 Arthrodesis status: Secondary | ICD-10-CM | POA: Insufficient documentation

## 2018-04-09 DIAGNOSIS — I420 Dilated cardiomyopathy: Secondary | ICD-10-CM | POA: Diagnosis not present

## 2018-04-09 DIAGNOSIS — Z833 Family history of diabetes mellitus: Secondary | ICD-10-CM | POA: Insufficient documentation

## 2018-04-09 DIAGNOSIS — Z9889 Other specified postprocedural states: Secondary | ICD-10-CM | POA: Insufficient documentation

## 2018-04-09 DIAGNOSIS — Z794 Long term (current) use of insulin: Secondary | ICD-10-CM

## 2018-04-09 DIAGNOSIS — Z955 Presence of coronary angioplasty implant and graft: Secondary | ICD-10-CM | POA: Insufficient documentation

## 2018-04-09 HISTORY — DX: Pulmonary hypertension, unspecified: I27.20

## 2018-04-09 LAB — BASIC METABOLIC PANEL
Anion gap: 14 (ref 5–15)
BUN: 34 mg/dL — ABNORMAL HIGH (ref 8–23)
CHLORIDE: 100 mmol/L (ref 98–111)
CO2: 27 mmol/L (ref 22–32)
Calcium: 9.1 mg/dL (ref 8.9–10.3)
Creatinine, Ser: 1.32 mg/dL — ABNORMAL HIGH (ref 0.44–1.00)
GFR calc Af Amer: 46 mL/min — ABNORMAL LOW (ref >60)
GFR calc non Af Amer: 40 mL/min — ABNORMAL LOW (ref >60)
GLUCOSE: 127 mg/dL — AB (ref 70–99)
Potassium: 3.9 mmol/L (ref 3.5–5.1)
Sodium: 141 mmol/L (ref 135–145)

## 2018-04-09 LAB — URINALYSIS, ROUTINE W REFLEX MICROSCOPIC
Bilirubin Urine: NEGATIVE
GLUCOSE, UA: NEGATIVE mg/dL
Hgb urine dipstick: NEGATIVE
KETONES UR: NEGATIVE mg/dL
Leukocytes, UA: NEGATIVE
Nitrite: NEGATIVE
PH: 5 (ref 5.0–8.0)
PROTEIN: NEGATIVE mg/dL
Specific Gravity, Urine: 1.009 (ref 1.005–1.030)

## 2018-04-09 LAB — HEPATIC FUNCTION PANEL
ALK PHOS: 89 U/L (ref 38–126)
ALT: 9 U/L (ref 0–44)
AST: 16 U/L (ref 15–41)
Albumin: 3.3 g/dL — ABNORMAL LOW (ref 3.5–5.0)
BILIRUBIN TOTAL: 0.7 mg/dL (ref 0.3–1.2)
Total Protein: 6.1 g/dL — ABNORMAL LOW (ref 6.5–8.1)

## 2018-04-09 LAB — CBG MONITORING, ED: Glucose-Capillary: 115 mg/dL — ABNORMAL HIGH (ref 70–99)

## 2018-04-09 LAB — CBC
HEMATOCRIT: 28.4 % — AB (ref 36.0–46.0)
Hemoglobin: 8.7 g/dL — ABNORMAL LOW (ref 12.0–15.0)
MCH: 26.9 pg (ref 26.0–34.0)
MCHC: 30.6 g/dL (ref 30.0–36.0)
MCV: 87.9 fL (ref 78.0–100.0)
Platelets: 182 10*3/uL (ref 150–400)
RBC: 3.23 MIL/uL — ABNORMAL LOW (ref 3.87–5.11)
RDW: 15.4 % (ref 11.5–15.5)
WBC: 8.5 10*3/uL (ref 4.0–10.5)

## 2018-04-09 LAB — TROPONIN I

## 2018-04-09 LAB — BRAIN NATRIURETIC PEPTIDE: B Natriuretic Peptide: 297.8 pg/mL — ABNORMAL HIGH (ref 0.0–100.0)

## 2018-04-09 MED ORDER — IPRATROPIUM-ALBUTEROL 0.5-2.5 (3) MG/3ML IN SOLN
3.0000 mL | Freq: Once | RESPIRATORY_TRACT | Status: AC
  Administered 2018-04-09: 3 mL via RESPIRATORY_TRACT
  Filled 2018-04-09: qty 3

## 2018-04-09 NOTE — ED Notes (Signed)
Pt CBG 115, notified [employer] banker, corporate)

## 2018-04-09 NOTE — ED Provider Notes (Signed)
[facility] EMERGENCY DEPARTMENT Provider Note   CSN: [id number] Arrival date & time: 04/09/18  1711     History   Chief Complaint Chief Complaint  Patient presents with  . Near Syncope  . Congestive Heart Failure    HPI Amy Shepard is a 72 y.o. female.  HPI Patient presents after near syncopal episode.  Has had shortness of breath over the last few days.  Think she is carrying some extra fluid.  History of CHF and COPD.  Also cor pulmonale.  States she is on chronic oxygen.  States she was getting out of the car today and had a near syncopal episode.  Sat down.  Reportedly had some low blood pressure given a fluid bolus.  Feels somewhat better now.  Has had a little bit of wheezing and cough.  No sputum production.  No chest pain. Past Medical History:  Diagnosis Date  . Arthritis   . CAD (coronary artery disease)    a. Prior cath 2015 showed 40% prox AD, 50-50% mLAD, otherwise calcification but no obstruction in LCx/RCA.. Medical management recommended. b. 2017: low-risk NST.  [name] Chronic diastolic CHF (congestive heart failure) ([place])   . Chronic respiratory failure ([place])   . CKD (chronic kidney disease), stage III ([place])   . COPD (chronic obstructive pulmonary disease) ([place])   . Cor pulmonale ([name])    a. felt due to advanced COPD and noncompliance with O2.  . Depression   . Diabetes mellitus    [name]    dx  2008  . Fracture    left foot  . Hx of seasonal allergies   . Hypercholesteremia   . Hypertension    on no medications  . Lung cancer ([place])   . On home oxygen therapy    "2.5L; 24/7" (03/10/2017)  . Pericardial effusion    a. small-moderate in 07/2016.  [name] UTI (urinary tract infection)     Patient Active Problem List   Diagnosis Date Noted  . Dyspnea   . Bronchitis, acute 03/11/2018  . Fatigue 03/11/2018  . Anemia due to stage 4 chronic kidney disease ([place]) 03/11/2018  . Pulmonary fibrosis ([place]) 03/11/2018  . Pulmonary HTN ([place])  03/11/2018  . Anemia 03/11/2018  . Lisfranc dislocation, left, subsequent encounter 06/08/2017  . Chronic respiratory failure with hypoxia ([place]) 06/03/2017  . Falls 06/03/2017  . Hypokalemia 06/03/2017  . Sepsis secondary to UTI ([place]) 06/02/2017  . Dog bite 05/02/2017  . CHF exacerbation ([place]) 05/02/2017  . Acute diastolic CHF (congestive heart failure) ([place]) 05/02/2017  . Chronic diastolic CHF (congestive heart failure) ([place]) 04/02/2017  . Cor pulmonale ([place]) 04/02/2017  . Acute on chronic respiratory failure ([place]) 03/10/2017  . Chronic pain 03/10/2017  . Lactic acidosis 02/10/2017  . Acute respiratory failure with hypoxia ([place]) 02/09/2017  . Acute on chronic diastolic CHF (congestive heart failure) ([place]) 06/23/2016  . HTN (hypertension) 06/23/2016  . Type 2 diabetes mellitus with complication, with long-term current use of insulin ([place]) 03/11/2016  . Depression 03/11/2016  . Chronic obstructive pulmonary disease ([place]) 03/10/2016  . S/P lumbar spinal fusion 02/22/2016  . Coronary artery disease involving native coronary artery 07/12/2014  . Pain in the chest   . Malignant neoplasm of lower lobe of right lung ([place]) 05/25/2014  . Lung cancer ([place]) 09/01/2012    Past Surgical History:  Procedure Laterality Date  . [place] or [facility] in [place]  SURGERY     bilateral cataracts  . LEFT HEART CATHETERIZATION WITH CORONARY ANGIOGRAM N/A 07/12/2014   Procedure: LEFT HEART CATHETERIZATION WITH CORONARY ANGIOGRAM;  Surgeon: [name], MD;  Location: [facility] CATH LAB;  Service: Cardiovascular;  Laterality: N/A;  . MAXIMUM ACCESS (MAS)POSTERIOR LUMBAR INTERBODY FUSION (PLIF) 2 LEVEL N/A 02/22/2016   Procedure: Lumbar three-four - Lumbar four-five  MAXIMUM ACCESS SURGERY  POSTERIOR LUMBAR INTERBODY FUSION;  Surgeon: [name], MD;  Location: [place] [facility] NEURO ORS;  Service: Neurosurgery;  Laterality: N/A;  . ORIF TOE FRACTURE Left 06/12/2017    Procedure: OPEN REDUCTION INTERNAL FIXATION (ORIF) BASE 1ST METATARSAL (TOE) FRACTURE;  Surgeon: Newt Minion, MD;  Location: [place];  Service: Orthopedics;  Laterality: Left;  . RIGHT HEART CATH N/A 08/06/2017   Procedure: RIGHT HEART CATH;  Surgeon: Larey Dresser, MD;  Location: [place] CV LAB;  Service: Cardiovascular;  Laterality: N/A;  . THORACOTOMY Right 2010   lower  . TONSILLECTOMY       OB History   None      Home Medications    Prior to Admission medications   Medication Sig Start Date End Date Taking? Authorizing Provider  acetaminophen (TYLENOL) 325 MG tablet Take 650 mg by mouth every 6 (six) hours as needed for moderate pain or fever.    Yes [provider]  aspirin EC 81 MG tablet Take 81 mg by mouth daily.    Yes [provider]  Carboxymethylcellulose Sod PF (REFRESH CELLUVISC) 1 % GEL Place 1 drop into both eyes 2 (two) times daily as needed (dry eyes).    Yes [provider]  Cholecalciferol (VITAMIN D) 2000 units CAPS Take 2,000 Units by mouth daily.   Yes [provider]  Fluticasone-Salmeterol (ADVAIR) 250-50 MCG/DOSE AEPB Inhale 1 puff into the lungs 2 (two) times daily as needed (shortness of breath/wheezing).    Yes [provider]  furosemide (LASIX) 40 MG tablet TAKE 1 TABLET BY MOUTH TWICE A DAY Patient taking differently: Take 40 mg by mouth 2 (two) times daily.  02/23/18  Yes Larey Dresser, MD  gabapentin (NEURONTIN) 300 MG capsule Take 300 mg by mouth 3 (three) times daily.   Yes [provider]  guaiFENesin (MUCINEX) 600 MG 12 hr tablet Take 1 tablet (600 mg total) by mouth 2 (two) times daily as needed for cough or to loosen phlegm. 05/07/17  Yes Aline August, MD  Insulin Detemir (LEVEMIR FLEXTOUCH) 100 UNIT/ML Pen Inject 36 Units into the skin at bedtime.    Yes [provider]  insulin lispro (HUMALOG) 100 UNIT/ML injection Inject 2-11 Units into the skin See admin instructions.  Inject 6 units with each meal and add additional units per sliding scale: BGL 101-150 = 2 units; 151-200 = 3 units; 201-250 = 5 units; 251-300 = 7 units; 301-350 = 9 units; >350 = 11 units; CALL MD IF BGL IS >400 OR <60   Yes [provider]  ipratropium-albuterol (DUONEB) 0.5-2.5 (3) MG/3ML SOLN Take 3 mLs by nebulization every 4 (four) hours as needed (wheezing, Shortness of breath). 03/13/16  Yes Johnson, Clanford L, MD  Macitentan (OPSUMIT) 10 MG TABS Take 1 tablet (10 mg total) by mouth daily. 08/17/17  Yes Larey Dresser, MD  OXYGEN Inhale 3 L into the lungs continuous. FOR COPD   Yes [provider]  sertraline (ZOLOFT) 50 MG tablet Take 150 mg by mouth daily.    Yes [provider]  spironolactone (ALDACTONE) 25 MG tablet TAKE 1/2 TABLET ONCE A DAY Patient taking differently: Take 12.5 mg by mouth daily.  02/23/18  Yes Larey Dresser, MD  traMADol (ULTRAM) 50 MG tablet Take 1 tablet (50 mg total) by mouth every 8 (eight) hours as needed (for pain). 06/06/17  Yes Danford, Suann Larry, MD  TRULICITY 3.71 GG/2.6RS SOPN Inject 0.5 mLs as directed every Sunday.  01/28/18  Yes [provider]  potassium chloride SA (K-DUR,KLOR-CON) 20 MEQ tablet Take 40 meq (2 tabs) in AM, then 20 meq (1 tab) in PM Patient not taking: Reported on 03/12/2018 10/27/17   Larey Dresser, MD  rosuvastatin (CRESTOR) 40 MG tablet Take 1 tablet (40 mg total) by mouth daily at 6 PM. Patient not taking: Reported on 03/12/2018 10/23/17   Larey Dresser, MD    Family History Family History  Problem Relation Age of Onset  . Diabetes Father   . Heart failure Father   . Cancer Sister   . Diabetes Brother   . Heart failure Brother     Social History Social History   Tobacco Use  . Smoking status: Former Smoker    Packs/day: 1.00    Years: 30.00    Pack years: 30.00    Last attempt to quit: 07/14/2006    Years since quitting: 11.7  . Smokeless tobacco: Never Used  Substance Use  Topics  . Alcohol use: No  . Drug use: No     Allergies   Amoxicillin   Review of Systems Review of Systems  Constitutional: Positive for fatigue. Negative for appetite change.  HENT: Negative for congestion.   Respiratory: Positive for shortness of breath.   Cardiovascular: Negative for chest pain.  Gastrointestinal: Negative for abdominal pain.  Genitourinary: Negative for flank pain.  Musculoskeletal: Negative for back pain.  Neurological: Positive for light-headedness.  Psychiatric/Behavioral: Negative for confusion.     Physical Exam Updated Vital Signs BP (!) 109/53   Pulse 86   Temp 98.4 F (36.9 C) (Oral)   Resp 19   Ht _0  (1.549 m)   Wt 78.9 kg   SpO2 91%   BMI 32.88 kg/m   Physical Exam  Constitutional: She appears well-developed.  HENT:  Head: Normocephalic.  Neck: Neck supple.  Cardiovascular: Normal rate.  Pulmonary/Chest:  Somewhat diffuse wheezes.  Abdominal: There is no tenderness.  Musculoskeletal: She exhibits edema.  Neurological: She is alert.  Skin: Skin is warm. Capillary refill takes less than 2 seconds.     ED Treatments / Results  Labs (all labs ordered are listed, but only abnormal results are displayed) Labs Reviewed  BASIC METABOLIC PANEL - Abnormal; Notable for the following components:      Result Value   Glucose, Bld 127 (*)    BUN 34 (*)    Creatinine, Ser 1.32 (*)    GFR calc non Af Amer 40 (*)    GFR calc Af Amer 46 (*)    All other components within normal limits  CBC - Abnormal; Notable for the following components:   RBC 3.23 (*)    Hemoglobin 8.7 (*)    HCT 28.4 (*)    All other components within normal limits  HEPATIC FUNCTION PANEL - Abnormal; Notable for the following components:   Total Protein 6.1 (*)    Albumin 3.3 (*)    All other components within normal limits  BRAIN NATRIURETIC PEPTIDE - Abnormal; Notable for the following components:   B Natriuretic Peptide  297.8 (*)    All other components  within normal limits  CBG MONITORING, ED - Abnormal; Notable for the following components:   Glucose-Capillary 115 (*)    All other components within normal limits  URINALYSIS, ROUTINE W REFLEX MICROSCOPIC  TROPONIN I    EKG EKG Interpretation  Date/Time:  Friday April 09 2018 17:26:31 EDT Ventricular Rate:  82 PR Interval:    QRS Duration: 112 QT Interval:  429 QTC Calculation: 502 R Axis:   -81 Text Interpretation:  Sinus rhythm Incomplete RBBB and LAFB Low voltage, precordial leads Consider anterior infarct Prolonged QT interval Confirmed by Davonna Belling [phone]) on 04/09/2018 8:08:38 PM   Radiology Dg Chest Portable 1 View  Result Date: 04/09/2018 CLINICAL DATA:  Chest pressure with shortness of breath. EXAM: PORTABLE CHEST 1 VIEW COMPARISON:  Multiple priors most recent 03/11/2018. FINDINGS: Cardiomegaly. Bibasilar scarring with hyperinflation. Calcified tortuous aorta. Surgical clips RIGHT lung reflect previous thoracotomy. No consolidation or edema. Osteopenia. IMPRESSION: Cardiomegaly with COPD.  No active disease. Electronically Signed   By: Staci Righter M.D.   On: 04/09/2018 18:16    Procedures Procedures (including critical care time)  Medications Ordered in ED Medications  ipratropium-albuterol (DUONEB) 0.5-2.5 (3) MG/3ML nebulizer solution 3 mL (3 mLs Nebulization Given 04/09/18 1756)     Initial Impression / Assessment and Plan / ED Course  I have reviewed the triage vital signs and the nursing notes.  Pertinent labs & imaging results that were available during my care of the patient were reviewed by me and considered in my medical decision making (see chart for details).     Patient with near syncopal episode.  History of both cardiac and lung issues.  Feels somewhat better however was not able to ambulate.  Still unsteady.  Feel patient would benefit from admission to hospital.  Patient son is talk to me.  States the patient has had multiple falls  at home.  Has been driving when she probably should not be.  Takes her oxygen out.  Think she would benefit from social work or further evaluation with this too.  Thinks she may need a higher level of care.  Final Clinical Impressions(s) / ED Diagnoses   Final diagnoses:  Near syncope    ED Discharge Orders    None       Davonna Belling, MD 04/09/18 2241

## 2018-04-09 NOTE — ED Triage Notes (Signed)
Per EMS pt was from home getting out of car and had a near syncopal event, where she felt weak and knelt down to the ground.  No LOC.  Denies pain, N/V, but was noted to be SOB by EMS.  She is on 3L 02 all the time, and has a hx of COPD and CHF.  She states that her legs are swollen.  She came in on a nonrebreather 10L with SpO2 of 98%.  AOx4 NAD at this time.  She received 361m of NS in route.

## 2018-04-10 ENCOUNTER — Observation Stay (HOSPITAL_BASED_OUTPATIENT_CLINIC_OR_DEPARTMENT_OTHER): Payer: Medicare Other

## 2018-04-10 ENCOUNTER — Encounter (HOSPITAL_COMMUNITY): Payer: Self-pay | Admitting: Family Medicine

## 2018-04-10 ENCOUNTER — Observation Stay (HOSPITAL_COMMUNITY): Payer: Medicare Other

## 2018-04-10 DIAGNOSIS — R55 Syncope and collapse: Secondary | ICD-10-CM | POA: Diagnosis not present

## 2018-04-10 DIAGNOSIS — I361 Nonrheumatic tricuspid (valve) insufficiency: Secondary | ICD-10-CM | POA: Diagnosis not present

## 2018-04-10 DIAGNOSIS — S82402A Unspecified fracture of shaft of left fibula, initial encounter for closed fracture: Secondary | ICD-10-CM | POA: Diagnosis present

## 2018-04-10 DIAGNOSIS — N184 Chronic kidney disease, stage 4 (severe): Secondary | ICD-10-CM | POA: Diagnosis not present

## 2018-04-10 DIAGNOSIS — D631 Anemia in chronic kidney disease: Secondary | ICD-10-CM | POA: Diagnosis not present

## 2018-04-10 DIAGNOSIS — M25579 Pain in unspecified ankle and joints of unspecified foot: Secondary | ICD-10-CM | POA: Diagnosis present

## 2018-04-10 LAB — BASIC METABOLIC PANEL
ANION GAP: 9 (ref 5–15)
BUN: 31 mg/dL — ABNORMAL HIGH (ref 8–23)
CO2: 30 mmol/L (ref 22–32)
Calcium: 9 mg/dL (ref 8.9–10.3)
Chloride: 100 mmol/L (ref 98–111)
Creatinine, Ser: 1.32 mg/dL — ABNORMAL HIGH (ref 0.44–1.00)
GFR calc Af Amer: 46 mL/min — ABNORMAL LOW (ref >60)
GFR, EST NON AFRICAN AMERICAN: 40 mL/min — AB (ref >60)
Glucose, Bld: 149 mg/dL — ABNORMAL HIGH (ref 70–99)
POTASSIUM: 3.9 mmol/L (ref 3.5–5.1)
SODIUM: 139 mmol/L (ref 135–145)

## 2018-04-10 LAB — TSH: TSH: 3.93 u[IU]/mL (ref 0.350–4.500)

## 2018-04-10 LAB — GLUCOSE, CAPILLARY
GLUCOSE-CAPILLARY: 104 mg/dL — AB (ref 70–99)
GLUCOSE-CAPILLARY: 116 mg/dL — AB (ref 70–99)
GLUCOSE-CAPILLARY: 193 mg/dL — AB (ref 70–99)
GLUCOSE-CAPILLARY: 235 mg/dL — AB (ref 70–99)
Glucose-Capillary: 142 mg/dL — ABNORMAL HIGH (ref 70–99)

## 2018-04-10 LAB — ECHOCARDIOGRAM COMPLETE
Height: 61 in
Weight: 2790.4 oz

## 2018-04-10 MED ORDER — ASPIRIN EC 81 MG PO TBEC
81.0000 mg | DELAYED_RELEASE_TABLET | Freq: Every day | ORAL | Status: DC
  Administered 2018-04-10 – 2018-04-11 (×2): 81 mg via ORAL
  Filled 2018-04-10 (×2): qty 1

## 2018-04-10 MED ORDER — HEPARIN SODIUM (PORCINE) 5000 UNIT/ML IJ SOLN
5000.0000 [IU] | Freq: Three times a day (TID) | INTRAMUSCULAR | Status: DC
  Administered 2018-04-10 – 2018-04-11 (×3): 5000 [IU] via SUBCUTANEOUS
  Filled 2018-04-10 (×5): qty 1

## 2018-04-10 MED ORDER — INSULIN ASPART 100 UNIT/ML ~~LOC~~ SOLN
0.0000 [IU] | Freq: Three times a day (TID) | SUBCUTANEOUS | Status: DC
  Administered 2018-04-10: 5 [IU] via SUBCUTANEOUS
  Administered 2018-04-10 – 2018-04-11 (×2): 3 [IU] via SUBCUTANEOUS
  Administered 2018-04-11: 5 [IU] via SUBCUTANEOUS

## 2018-04-10 MED ORDER — SODIUM CHLORIDE 0.9% FLUSH
3.0000 mL | Freq: Two times a day (BID) | INTRAVENOUS | Status: DC
  Administered 2018-04-10 – 2018-04-11 (×3): 3 mL via INTRAVENOUS

## 2018-04-10 MED ORDER — SODIUM CHLORIDE 0.9% FLUSH: 3.0000 mL | INTRAVENOUS | Status: DC | PRN

## 2018-04-10 MED ORDER — GUAIFENESIN ER 600 MG PO TB12: 600.0000 mg | ORAL_TABLET | Freq: Two times a day (BID) | ORAL | Status: DC | PRN

## 2018-04-10 MED ORDER — ACETAMINOPHEN 650 MG RE SUPP: 650.0000 mg | Freq: Four times a day (QID) | RECTAL | Status: DC | PRN

## 2018-04-10 MED ORDER — ONDANSETRON HCL 4 MG PO TABS: 4.0000 mg | ORAL_TABLET | Freq: Four times a day (QID) | ORAL | Status: DC | PRN

## 2018-04-10 MED ORDER — INSULIN DETEMIR 100 UNIT/ML ~~LOC~~ SOLN
20.0000 [IU] | Freq: Every day | SUBCUTANEOUS | Status: DC
  Administered 2018-04-10 (×2): 20 [IU] via SUBCUTANEOUS
  Filled 2018-04-10 (×3): qty 0.2

## 2018-04-10 MED ORDER — GABAPENTIN 300 MG PO CAPS
300.0000 mg | ORAL_CAPSULE | Freq: Three times a day (TID) | ORAL | Status: DC
  Administered 2018-04-10 – 2018-04-11 (×4): 300 mg via ORAL
  Filled 2018-04-10 (×4): qty 1

## 2018-04-10 MED ORDER — SPIRONOLACTONE 12.5 MG HALF TABLET
12.5000 mg | ORAL_TABLET | Freq: Every day | ORAL | Status: DC
  Administered 2018-04-10: 12.5 mg via ORAL
  Filled 2018-04-10: qty 1

## 2018-04-10 MED ORDER — IPRATROPIUM-ALBUTEROL 0.5-2.5 (3) MG/3ML IN SOLN: 3.0000 mL | RESPIRATORY_TRACT | Status: DC | PRN

## 2018-04-10 MED ORDER — HYPROMELLOSE (GONIOSCOPIC) 2.5 % OP SOLN
1.0000 [drp] | Freq: Two times a day (BID) | OPHTHALMIC | Status: DC | PRN
  Filled 2018-04-10: qty 15

## 2018-04-10 MED ORDER — SERTRALINE HCL 50 MG PO TABS
150.0000 mg | ORAL_TABLET | Freq: Every day | ORAL | Status: DC
  Administered 2018-04-10 – 2018-04-11 (×2): 150 mg via ORAL
  Filled 2018-04-10 (×2): qty 1

## 2018-04-10 MED ORDER — MOMETASONE FURO-FORMOTEROL FUM 200-5 MCG/ACT IN AERO
2.0000 | INHALATION_SPRAY | Freq: Two times a day (BID) | RESPIRATORY_TRACT | Status: DC
  Administered 2018-04-11: 2 via RESPIRATORY_TRACT
  Filled 2018-04-10: qty 8.8

## 2018-04-10 MED ORDER — ONDANSETRON HCL 4 MG/2ML IJ SOLN: 4.0000 mg | Freq: Four times a day (QID) | INTRAMUSCULAR | Status: DC | PRN

## 2018-04-10 MED ORDER — MACITENTAN 10 MG PO TABS
10.0000 mg | ORAL_TABLET | Freq: Every day | ORAL | Status: DC
  Administered 2018-04-10 – 2018-04-11 (×2): 10 mg via ORAL
  Filled 2018-04-10 (×2): qty 1

## 2018-04-10 MED ORDER — INSULIN ASPART 100 UNIT/ML ~~LOC~~ SOLN: 0.0000 [IU] | Freq: Every day | SUBCUTANEOUS | Status: DC

## 2018-04-10 MED ORDER — SODIUM CHLORIDE 0.9% FLUSH
3.0000 mL | Freq: Two times a day (BID) | INTRAVENOUS | Status: DC
  Administered 2018-04-10: 3 mL via INTRAVENOUS

## 2018-04-10 MED ORDER — SENNOSIDES-DOCUSATE SODIUM 8.6-50 MG PO TABS: 1.0000 | ORAL_TABLET | Freq: Every evening | ORAL | Status: DC | PRN

## 2018-04-10 MED ORDER — SODIUM CHLORIDE 0.9 % IV SOLN: 250.0000 mL | INTRAVENOUS | Status: DC | PRN

## 2018-04-10 MED ORDER — FUROSEMIDE 40 MG PO TABS
40.0000 mg | ORAL_TABLET | Freq: Two times a day (BID) | ORAL | Status: DC
  Administered 2018-04-10 – 2018-04-11 (×3): 40 mg via ORAL
  Filled 2018-04-10 (×3): qty 1

## 2018-04-10 MED ORDER — TRAMADOL HCL 50 MG PO TABS: 50.0000 mg | ORAL_TABLET | Freq: Three times a day (TID) | ORAL | Status: DC | PRN

## 2018-04-10 MED ORDER — VITAMIN D 1000 UNITS PO TABS
2000.0000 [IU] | ORAL_TABLET | Freq: Every day | ORAL | Status: DC
  Administered 2018-04-10 – 2018-04-11 (×2): 2000 [IU] via ORAL
  Filled 2018-04-10 (×2): qty 2

## 2018-04-10 MED ORDER — ACETAMINOPHEN 325 MG PO TABS
650.0000 mg | ORAL_TABLET | Freq: Four times a day (QID) | ORAL | Status: DC | PRN
  Administered 2018-04-10: 650 mg via ORAL

## 2018-04-10 NOTE — Progress Notes (Addendum)
[facility] PROGRESS NOTE  Amy Shepard FUX:[id number] DOB: 07/20/1945 DOA: 04/09/2018 PCP: Maurice Small, MD  Assessment/Plan: . Near-syncope  - Presents following a near-syncopal episode that occurred upon standing. Patient feels new medications (spironolactone and Opsumit) started about 1 month ago contributing to dizziness - EKG with incomplete RBBB and LAFB, slightly prolonged QT, similar to prior  - There was 17 mmHg drop in SBP upon standing, but appeared hypervolemic on admission. -no events on tele. Oxygen saturation level greater 90% on her usual 3L.  Await echo results  -will hold aldactone and Opsumit  2. COPD; chronic hypoxic respiratory failure  -stable at baseline this am.   - Continue ICS/LABA and prn nebs, continue supplemental O2    3. Chronic diastolic CHF; Pulmonary HTN   - Appeared a little hypervolemic on admission per chart. No increased O2-requirement or edema on CXR . Weight stable - Continue Lasix, holding aldactone and Opsumit  - Follow daily wt and I/O's -follow echo results  4. CKD stage IV - SCr is 1.32 today which according to chart, appears to be better than baseline. s    5. Insulin-dependent DM  - A1c was 14.4% a year ago  - Managed at home with Trulicity, Levemir 36 units qHS, and Humalog per sliding-scale  -poor control. Will obtain HgA1c. Will continue levemir at lower than home dose for now as appetite unreliable. -continue SSI for optimal control  6. Anemia  - Hgb is 8.7 on admission, similar to priors  - Denies melena or hematochezia  - Likely secondary to CKD   7. Left ankle pain//distal fibular metaphysis fracture per xray. Complained worsening left ankle pain since yesterday.  -cam boot -follow up with tim murphy Monday or Wed at 8:30 -PT   Code Status: full Family Communication: none present Disposition Plan: Home hopefully  tomorrow   Consultants:  none  Procedures:  echo  Antibiotics:  none  HPI/Subjective: Amy Shepard is a 72 y.o. female with medical history significant for CKD 4, chronic diastolic CHF, pulmonary hypertension, COPD with chronic hypoxic respiratory failure, chronic anemia, and insulin-dependent diabetes mellitus admitted 9/28 after near syncopal episode.  Patient reported that she had been in her usual state and was getting out of her car when she became acutely lightheaded and felt as if she is going to pass out. She was given 350cc IV fluid per ems. She felt as tho her legs were more swollen than ususl  Objective: Vitals:   04/10/18 0544 04/10/18 1231  BP: (!) 97/51 124/64  Pulse: 80 80  Resp: 20 16  Temp: 98 F (36.7 C) 98.1 F (36.7 C)  SpO2: 93% (!) 89%    Intake/Output Summary (Last 24 hours) at 04/10/2018 1645 Last data filed at 04/10/2018 1033 Gross per 24 hour  Intake 920 ml  Output 650 ml  Net 270 ml   Filed Weights   04/09/18 1717 04/10/18 0014  Weight: 78.9 kg 79.1 kg    Exam:   General:  Awake alert oriented x3  Cardiovascular: RRR no MGR trace LE edema  Respiratory: mild increased work of breathing with conversation. BS distant. I hear no crackles or wheezes  Abdomen: obese non-distended +BS throughout no guarding or rebounding  Musculoskeletal: left ankle with some swelling, no erythema, decreased rom and tender to palpation. Other joints without swelling/erythema   Data Reviewed: Basic Metabolic Panel: Recent Labs  Lab 04/09/18 1749 04/10/18 0547  NA 141 139  K 3.9 3.9  CL 100 100  CO2 27 30  GLUCOSE 127* 149*  BUN 34* 31*  CREATININE 1.32* 1.32*  CALCIUM 9.1 9.0   Liver Function Tests: Recent Labs  Lab 04/09/18 1749  AST 16  ALT 9  ALKPHOS 89  BILITOT 0.7  PROT 6.1*  ALBUMIN 3.3*   No results for input(s): LIPASE, AMYLASE in the last 168 hours. No results for input(s): AMMONIA in the last 168 hours. CBC: Recent  Labs  Lab 04/09/18 1749  WBC 8.5  HGB 8.7*  HCT 28.4*  MCV 87.9  PLT 182   Cardiac Enzymes: Recent Labs  Lab 04/09/18 1749  TROPONINI <0.03   BNP (last 3 results) Recent Labs    06/02/17 1927 03/11/18 1526 04/09/18 1749  BNP 337.7* 246.9* 297.8*    ProBNP (last 3 results) No results for input(s): PROBNP in the last 8760 hours.  CBG: Recent Labs  Lab 04/09/18 1839 04/10/18 0012 04/10/18 0823 04/10/18 1228  GLUCAP 115* 116* 235* 104*    No results found for this or any previous visit (from the past 240 hour(s)).   Studies: Dg Ankle 2 Views Left  Result Date: 04/10/2018 CLINICAL DATA:  Fall 2 days ago EXAM: LEFT ANKLE - 2 VIEW COMPARISON:  None. FINDINGS: There is a cortical step-off along the distal lateral fibula. Fracture of indeterminate age is suspected. Oblique view is not included. There is soft tissue swelling over the lateral malleolus and anterior to the ankle joint. Osteopenia. Postoperative changes in the midfoot. Spurring at the inferior calcaneus. IMPRESSION: Fracture of indeterminate age of the distal fibula is suspected. Dedicated three view study is recommended. Electronically Signed   By: Marybelle Killings M.D.   On: 04/10/2018 09:45   Dg Chest Portable 1 View  Result Date: 04/09/2018 CLINICAL DATA:  Chest pressure with shortness of breath. EXAM: PORTABLE CHEST 1 VIEW COMPARISON:  Multiple priors most recent 03/11/2018. FINDINGS: Cardiomegaly. Bibasilar scarring with hyperinflation. Calcified tortuous aorta. Surgical clips RIGHT lung reflect previous thoracotomy. No consolidation or edema. Osteopenia. IMPRESSION: Cardiomegaly with COPD.  No active disease. Electronically Signed   By: Staci Righter M.D.   On: 04/09/2018 18:16   Dg Ankle Left Port  Result Date: 04/10/2018 CLINICAL DATA:  Left ankle pain.  Status post fall. EXAM: PORTABLE LEFT ANKLE - 2 VIEW COMPARISON:  04/10/2018 FINDINGS: Acute nondisplaced fracture of the distal fibular metaphysis with  overlying soft tissue swelling. No other fracture or dislocation. Ankle mortise is intact. Partially visualize is prior arthrodesis of the first TMT joint. IMPRESSION: 1. Acute nondisplaced fracture of the distal fibular metaphysis with overlying soft tissue swelling. Electronically Signed   By: Kathreen Devoid   On: 04/10/2018 13:19    Scheduled Meds: . aspirin EC  81 mg Oral Daily  . cholecalciferol  2,000 Units Oral Daily  . furosemide  40 mg Oral BID  . gabapentin  300 mg Oral TID  . heparin  5,000 Units Subcutaneous Q8H  . insulin aspart  0-15 Units Subcutaneous TID WC  . insulin aspart  0-5 Units Subcutaneous QHS  . insulin detemir  20 Units Subcutaneous QHS  . macitentan  10 mg Oral Daily  . mometasone-formoterol  2 puff Inhalation BID  . sertraline  150 mg Oral Daily  . sodium chloride flush  3 mL Intravenous Q12H  . sodium chloride flush  3 mL Intravenous Q12H   Continuous Infusions: . sodium chloride      Principal Problem:   Near syncope Active Problems:   Chronic obstructive pulmonary disease (  [place])   HTN (hypertension)   Chronic diastolic CHF (congestive heart failure) ([name])   Chronic respiratory failure with hypoxia ([name])   Anemia due to stage 4 chronic kidney disease ([name])   CKD (chronic kidney disease), stage IV ([name])   Closed left fibular fracture   Pain in joint, ankle and foot   Type 2 diabetes mellitus with complication, with long-term current use of insulin ([place])   Pulmonary HTN ([place])    Time spent: 40 minutes    [facility]  If 7PM-7AM, please contact night-coverage at [email or web address], password [facility] 04/10/2018, 4:45 PM  LOS: 0 days

## 2018-04-10 NOTE — Progress Notes (Signed)
Orthopedic Tech Progress Note Patient Details:  Amy Shepard 30-Nov-1945 [id number]  Patient ID: [name], female   DOB: Jun 25, 1946, 72 y.o.   MRN: [id number]   [name] 04/10/2018, 4:35 PM Pt stated that her son will bring her cam walker from home; RN notified

## 2018-04-10 NOTE — Evaluation (Signed)
Physical Therapy Evaluation Patient Details Name: Amy Shepard MRN: [id number] DOB: 1946/02/27 Today's Date: 04/10/2018   History of Present Illness  Pt is a 72 y/o female admitted secondary to having a near syncopal episode when getting out of her car. Pt found to have EKG with incomplete RBBB and LAFB, slightly prolonged QT, similar to prior. PMH including but not limited to CAD, CHF, COPD (O2 dependent), CKD, DM and HTN.    Clinical Impression  Pt presented supine in bed with HOB elevated, awake and willing to participate in therapy session. Prior to admission, pt reported that she ambulated with use of a rollator and is independent with ADLs. Pt lives alone and has family/friends that can assist her intermittently if needed. Pt currently at supervision to min guard level for all functional mobility with RW. Pt limited this session secondary to fatigue. Pt would continue to benefit from skilled physical therapy services at this time while admitted and after d/c to address the below listed limitations in order to improve overall safety and independence with functional mobility.     Follow Up Recommendations Home health PT;Supervision for mobility/OOB    Equipment Recommendations  None recommended by PT    Recommendations for Other Services       Precautions / Restrictions Precautions Precautions: Fall Restrictions Weight Bearing Restrictions: No      Mobility  Bed Mobility Overal bed mobility: Needs Assistance Bed Mobility: Supine to Sit     Supine to sit: Supervision     General bed mobility comments: increased time, supervision for safety  Transfers Overall transfer level: Needs assistance Equipment used: Rolling walker (2 wheeled) Transfers: Sit to/from Stand Sit to Stand: Min guard         General transfer comment: cueing for safe hand placement, min guard for safety  Ambulation/Gait Ambulation/Gait assistance: Min guard Gait Distance (Feet): 30  Feet(20' x1, 10' x1) Assistive device: Rolling walker (2 wheeled) Gait Pattern/deviations: Step-through pattern;Decreased stride length Gait velocity: decreased Gait velocity interpretation: <1.31 ft/sec, indicative of household ambulator General Gait Details: pt with slow, cautious gait but steady overall with RW; min guard for safety; pt limited secondary to fatigue  Stairs            Wheelchair Mobility    Modified Rankin (Stroke Patients Only)       Balance Overall balance assessment: Needs assistance Sitting-balance support: Feet supported Sitting balance-Leahy Scale: Good     Standing balance support: Bilateral upper extremity supported Standing balance-Leahy Scale: Poor                               Pertinent Vitals/Pain Pain Assessment: No/denies pain    Home Living Family/patient expects to be discharged to:: Private residence Living Arrangements: Alone Available Help at Discharge: Family;Available PRN/intermittently Type of Home: Apartment Home Access: Level entry     Home Layout: One level Home Equipment: Walker - 4 wheels;Shower seat      Prior Function Level of Independence: Independent with assistive device(s)         Comments: uses Rollator, independent with ADL's     Hand Dominance        Extremity/Trunk Assessment   Upper Extremity Assessment Upper Extremity Assessment: Generalized weakness    Lower Extremity Assessment Lower Extremity Assessment: Generalized weakness       Communication   Communication: No difficulties  Cognition Arousal/Alertness: Awake/alert Behavior During Therapy: WFL for tasks assessed/performed Overall Cognitive  Status: Within Functional Limits for tasks assessed                                        General Comments      Exercises     Assessment/Plan    PT Assessment Patient needs continued PT services  PT Problem List Decreased strength;Decreased activity  tolerance;Decreased balance;Decreased mobility;Decreased coordination;Cardiopulmonary status limiting activity       PT Treatment Interventions DME instruction;Gait training;Stair training;Therapeutic exercise;Functional mobility training;Therapeutic activities;Balance training;Neuromuscular re-education;Patient/family education    PT Goals (Current goals can be found in the Care Plan section)  Acute Rehab PT Goals Patient Stated Goal: return home tomorrow PT Goal Formulation: With patient Time For Goal Achievement: 04/24/18 Potential to Achieve Goals: Good    Frequency Min 3X/week   Barriers to discharge        Co-evaluation               AM-PAC PT "6 Clicks" Daily Activity  Outcome Measure Difficulty turning over in bed (including adjusting bedclothes, sheets and blankets)?: None Difficulty moving from lying on back to sitting on the side of the bed? : None Difficulty sitting down on and standing up from a chair with arms (e.g., wheelchair, bedside commode, etc,.)?: Unable Help needed moving to and from a bed to chair (including a wheelchair)?: A Little Help needed walking in hospital room?: A Little Help needed climbing 3-5 steps with a railing? : A Little 6 Click Score: 18    End of Session Equipment Utilized During Treatment: Gait belt;Oxygen Activity Tolerance: Patient limited by fatigue Patient left: in chair;with call bell/phone within reach Nurse Communication: Mobility status PT Visit Diagnosis: Other abnormalities of gait and mobility (R26.89);Muscle weakness (generalized) (M62.81)    Time: 0923-3007 PT Time Calculation (min) (ACUTE ONLY): 17 min   Charges:   PT Evaluation $PT Eval Moderate Complexity: 1 Mod          Sherie Don, PT, DPT  Acute Rehabilitation Services Pager [phone] Office [place] 04/10/2018, 3:50 PM

## 2018-04-10 NOTE — H&P (Signed)
History and Physical    Amy Shepard IRC:[id number] DOB: May 18, 1946 DOA: 04/09/2018  PCP: Maurice Small, MD   Patient coming from: Home   Chief Complaint: near-syncope   HPI: Amy Shepard is a 72 y.o. female with medical history significant for CKD 4, chronic diastolic CHF, pulmonary hypertension, COPD with chronic hypoxic respiratory failure, chronic anemia, and insulin-dependent diabetes mellitus, now presenting to the emergency department after near syncopal episode.  Patient reports that she had been in her usual state and was getting out of her car when she became acutely lightheaded and felt as if she is going to pass out.  She was able to lower herself back down without any injury or actual loss of consciousness.  She denies any associated chest pain or palpitations and denies any headache, change in vision or hearing, or focal numbness or weakness.  She reports some mild chronic lower extremity edema, but denies any leg tenderness, chest pain, or hemoptysis.  She has experienced similar symptoms previously, but not as severe.  EMS was called, she reportedly had a low blood pressure, and was administered 350 cc of normal saline prior to arrival in the ED.  ED Course: Upon arrival to the ED, patient is found to be afebrile, saturating well on her usual 3 L/min of supplemental oxygen, and with vitals otherwise normal.  EKG features a sinus rhythm with incomplete RBBB, LAFB, and QTc interval 502 ms, similar to priors.  Chest x-ray is notable for stable cardiomegaly and COPD changes, but no acute findings.  Chemistry panel features a creatinine 1.32, better than priors.  CBC is notable for a chronic normocytic anemia with hemoglobin of 8.7.  Troponin is undetectable.  Patient was given a DuoNeb in the ED.  She remains hemodynamically stable and will be observed on the telemetry unit for ongoing evaluation and management of near syncope.  Review of Systems:  All other systems reviewed  and apart from HPI, are negative.  Past Medical History:  Diagnosis Date  . Arthritis   . CAD (coronary artery disease)    a. Prior cath 2015 showed 40% prox AD, 50-50% mLAD, otherwise calcification but no obstruction in LCx/RCA.. Medical management recommended. b. 2017: low-risk NST.  [name] Chronic diastolic CHF (congestive heart failure) ([place])   . Chronic respiratory failure ([place])   . CKD (chronic kidney disease), stage III ([place])   . COPD (chronic obstructive pulmonary disease) ([place])   . Cor pulmonale ([name])    a. felt due to advanced COPD and noncompliance with O2.  . Depression   . Diabetes mellitus    [name]    dx  2008  . Fracture    left foot  . Hx of seasonal allergies   . Hypercholesteremia   . Hypertension    on no medications  . Lung cancer ([place])   . On home oxygen therapy    "2.5L; 24/7" (03/10/2017)  . Pericardial effusion    a. small-moderate in 07/2016.  [name] UTI (urinary tract infection)     Past Surgical History:  Procedure Laterality Date  . [facility] in [place]     bilateral cataracts  . LEFT HEART CATHETERIZATION WITH CORONARY ANGIOGRAM N/A 07/12/2014   Procedure: LEFT HEART CATHETERIZATION WITH CORONARY ANGIOGRAM;  Surgeon: [name], MD;  Location: [facility] CATH LAB;  Service: Cardiovascular;  Laterality: N/A;  . MAXIMUM ACCESS (MAS)POSTERIOR LUMBAR INTERBODY FUSION (PLIF)  2 LEVEL N/A 02/22/2016   Procedure: Lumbar three-four - Lumbar four-five  MAXIMUM ACCESS SURGERY  POSTERIOR LUMBAR INTERBODY FUSION;  Surgeon: [name], MD;  Location: [facility] NEURO ORS;  Service: Neurosurgery;  Laterality: N/A;  . ORIF TOE FRACTURE Left 06/12/2017   Procedure: OPEN REDUCTION INTERNAL FIXATION (ORIF) BASE 1ST METATARSAL (TOE) FRACTURE;  Surgeon: Newt Minion, MD;  Location: [facility];  Service: Orthopedics;  Laterality: Left;  . RIGHT HEART CATH N/A 08/06/2017   Procedure: RIGHT HEART CATH;  Surgeon: Larey Dresser, MD;  Location: [facility] CV LAB;  Service: Cardiovascular;  Laterality: N/A;  . THORACOTOMY Right 2010   lower  . TONSILLECTOMY       reports that she quit smoking about 11 years ago. She has a 30.00 pack-year smoking history. She has never used smokeless tobacco. She reports that she does not drink alcohol or use drugs.  Allergies  Allergen Reactions  . Amoxicillin Anaphylaxis, Hives, Rash and Other (See Comments)    Has patient had a PCN reaction causing immediate rash, facial/tongue/throat swelling, SOB or lightheadedness with hypotension: YES Positive reaction causing SEVERE RASH INVOLVING MUCUS MEMBRANES/SKIN NECROSIS: YES Reaction that required HOSPITALIZATION: YES Reaction occurring within the last 10 years: NO    Family History  Problem Relation Age of Onset  . Diabetes Father   . Heart failure Father   . Cancer Sister   . Diabetes Brother   . Heart failure Brother      Prior to Admission medications   Medication Sig Start Date End Date Taking? Authorizing Provider  acetaminophen (TYLENOL) 325 MG tablet Take 650 mg by mouth every 6 (six) hours as needed for moderate pain or fever.    Yes [provider]  aspirin EC 81 MG tablet Take 81 mg by mouth daily.    Yes [provider]  Carboxymethylcellulose Sod PF (REFRESH CELLUVISC) 1 % GEL Place 1 drop into both eyes 2 (two) times daily as needed (dry eyes).    Yes [provider]  Cholecalciferol (VITAMIN D) 2000 units CAPS Take 2,000 Units by mouth daily.   Yes [provider]  Fluticasone-Salmeterol (ADVAIR) 250-50 MCG/DOSE AEPB Inhale 1 puff into the lungs 2 (two) times daily as needed (shortness of breath/wheezing).    Yes [provider]  furosemide (LASIX) 40 MG tablet TAKE 1 TABLET BY MOUTH TWICE A DAY Patient taking differently: Take 40 mg by mouth 2 (two) times daily.  02/23/18  Yes Larey Dresser, MD  gabapentin (NEURONTIN) 300 MG capsule Take 300 mg by mouth 3  (three) times daily.   Yes [provider]  guaiFENesin (MUCINEX) 600 MG 12 hr tablet Take 1 tablet (600 mg total) by mouth 2 (two) times daily as needed for cough or to loosen phlegm. 05/07/17  Yes Aline August, MD  Insulin Detemir (LEVEMIR FLEXTOUCH) 100 UNIT/ML Pen Inject 36 Units into the skin at bedtime.    Yes [provider]  insulin lispro (HUMALOG) 100 UNIT/ML injection Inject 2-11 Units into the skin See admin instructions. Inject 6 units with each meal and add additional units per sliding scale: BGL 101-150 = 2 units; 151-200 = 3 units; 201-250 = 5 units; 251-300 = 7 units; 301-350 = 9 units; >350 = 11 units; CALL MD IF BGL IS >400 OR <60   Yes [provider]  ipratropium-albuterol (DUONEB) 0.5-2.5 (3) MG/3ML SOLN Take 3 mLs by nebulization every 4 (four) hours as needed (wheezing, Shortness of breath). 03/13/16  Yes Johnson, Clanford L, MD  Macitentan (OPSUMIT) 10 MG TABS Take 1 tablet (10 mg total) by mouth daily. 08/17/17  Yes Larey Dresser, MD  OXYGEN Inhale 3 L into the lungs continuous. FOR COPD   Yes [provider]  sertraline (ZOLOFT) 50 MG tablet Take 150 mg by mouth daily.    Yes [provider]  spironolactone (ALDACTONE) 25 MG tablet TAKE 1/2 TABLET ONCE A DAY Patient taking differently: Take 12.5 mg by mouth daily.  02/23/18  Yes Larey Dresser, MD  traMADol (ULTRAM) 50 MG tablet Take 1 tablet (50 mg total) by mouth every 8 (eight) hours as needed (for pain). 06/06/17  Yes Danford, Suann Larry, MD  TRULICITY 7.59 FM/3.8GY SOPN Inject 0.5 mLs as directed every Sunday.  01/28/18  Yes [provider]  potassium chloride SA (K-DUR,KLOR-CON) 20 MEQ tablet Take 40 meq (2 tabs) in AM, then 20 meq (1 tab) in PM Patient not taking: Reported on 03/12/2018 10/27/17   Larey Dresser, MD  rosuvastatin (CRESTOR) 40 MG tablet Take 1 tablet (40 mg total) by mouth daily at 6 PM. Patient not taking: Reported on 03/12/2018 10/23/17    Larey Dresser, MD    Physical Exam: Vitals:   04/09/18 2300 04/09/18 2315 04/10/18 0010 04/10/18 0014  BP: 131/65 134/63 (!) 114/59   Pulse: 80 77 80   Resp: _0 Temp:   98.3 F (36.8 C)   TempSrc:   Oral   SpO2: 98% 98% 100%   Weight:    79.1 kg  Height:    _1  (1.549 m)     Constitutional: NAD, calm  Eyes: PERTLA, lids and conjunctivae normal ENMT: Mucous membranes are moist. Posterior pharynx clear of any exudate or lesions.   Neck: normal, supple, no masses, no thyromegaly Respiratory: Breath sounds diminished bialterally, no wheezing, no crackles. Normal respiratory effort.   Cardiovascular: S1 & S2 heard, regular rate and rhythm. 1+ pretibial edema bilaterally. Abdomen: No distension, no tenderness, soft. Bowel sounds active.  Musculoskeletal: no clubbing / cyanosis. No joint deformity upper and lower extremities.   Skin: no significant rashes, lesions, ulcers. Warm, dry, well-perfused. Neurologic: No facial asymmetry. Sensation intact. Moving all extremitites.  Psychiatric: Alert and oriented to person, place, and situation. Pleasant, cooperative.    Labs on Admission: I have personally reviewed following labs and imaging studies  CBC: Recent Labs  Lab 04/09/18 1749  WBC 8.5  HGB 8.7*  HCT 28.4*  MCV 87.9  PLT 659   Basic Metabolic Panel: Recent Labs  Lab 04/09/18 1749  NA 141  K 3.9  CL 100  CO2 27  GLUCOSE 127*  BUN 34*  CREATININE 1.32*  CALCIUM 9.1   GFR: Estimated Creatinine Clearance: 37.2 mL/min (A) (by C-G formula based on SCr of 1.32 mg/dL (H)). Liver Function Tests: Recent Labs  Lab 04/09/18 1749  AST 16  ALT 9  ALKPHOS 89  BILITOT 0.7  PROT 6.1*  ALBUMIN 3.3*   No results for input(s): LIPASE, AMYLASE in the last 168 hours. No results for input(s): AMMONIA in the last 168 hours. Coagulation Profile: No results for input(s): INR, PROTIME in the last 168 hours. Cardiac Enzymes: Recent Labs  Lab 04/09/18 1749    TROPONINI <0.03   BNP (last 3 results) No results for input(s): PROBNP in the last 8760 hours. HbA1C: No results for input(s): HGBA1C in the last 72 hours. CBG: Recent Labs  Lab 04/09/18 1839 04/10/18 0012  GLUCAP 115* 116*   Lipid Profile: No results for input(s): CHOL, HDL, LDLCALC, TRIG, CHOLHDL, LDLDIRECT in the last 72 hours. Thyroid Function Tests: No results for input(s): TSH, T4TOTAL, FREET4, T3FREE, THYROIDAB in the last 72 hours. Anemia Panel: No results for input(s): VITAMINB12, FOLATE, FERRITIN, TIBC, IRON, RETICCTPCT in the last 72 hours. Urine analysis:    Component Value Date/Time   COLORURINE YELLOW 04/09/2018 1910   APPEARANCEUR CLEAR 04/09/2018 1910   LABSPEC 1.009 04/09/2018 1910   PHURINE 5.0 04/09/2018 1910   GLUCOSEU NEGATIVE 04/09/2018 1910   HGBUR NEGATIVE 04/09/2018 1910   BILIRUBINUR NEGATIVE 04/09/2018 1910   KETONESUR NEGATIVE 04/09/2018 1910   PROTEINUR NEGATIVE 04/09/2018 1910   UROBILINOGEN 0.2 02/10/2013 1041   NITRITE NEGATIVE 04/09/2018 1910   LEUKOCYTESUR NEGATIVE 04/09/2018 1910   Sepsis Labs: _0 (procalcitonin:4,lacticidven:4) )No results found for this or any previous visit (from the past 240 hour(s)).   Radiological Exams on Admission: Dg Chest Portable 1 View  Result Date: 04/09/2018 CLINICAL DATA:  Chest pressure with shortness of breath. EXAM: PORTABLE CHEST 1 VIEW COMPARISON:  Multiple priors most recent 03/11/2018. FINDINGS: Cardiomegaly. Bibasilar scarring with hyperinflation. Calcified tortuous aorta. Surgical clips RIGHT lung reflect previous thoracotomy. No consolidation or edema. Osteopenia. IMPRESSION: Cardiomegaly with COPD.  No active disease. Electronically Signed   By: Staci Righter M.D.   On: 04/09/2018 18:16    EKG: Independently reviewed. Sinus rhythm, incomplete RBBB, LAFB, QTc 502 ms.   Assessment/Plan  1. Near-syncope  - Presents following a near-syncopal episode that occurred upon standing  - EKG  with incomplete RBBB and LAFB, slightly prolonged QT, similar to prior  - There was 17 mmHg drop in SBP upon standing, but appears hypervolemic on admission  - Plan to continue cardiac monitoring, check echocardiogram   2. COPD; chronic hypoxic respiratory failure  - No wheezing on admission, thought did just receive neb treatment, denies increase in chronic SOB and cough  - Continue ICS/LABA and prn nebs, continue supplemental O2    3. Chronic diastolic CHF; Pulmonary HTN   - Appears a little hypervolemic on admission, no increased O2-requirement or edema on CXR  - Continue Lasix, Aldactone, and macitentan  - Follow daily wt and I/O's; echo is being updated as above    4. CKD stage IV - SCr is 1.32 on admission, better than priors  - Renally-dose medications    5. Insulin-dependent DM  - A1c was 14.4% a year ago  - Managed at home with Trulicity, Levemir 36 units qHS, and Humalog per sliding-scale  - Check CBG's, continue Levemir, use Novolog SSI while in hospital    6. Anemia  - Hgb is 8.7 on admission, similar to priors  - Denies melena or hematochezia  - Likely secondary to CKD    DVT prophylaxis: sq heparin Code Status: Full Family Communication: Discussed with patient  Consults called: None Admission status: Observation    Vianne Bulls, MD [facility] Pager [phone]  If 7PM-7AM, please contact night-coverage [email or web address] Password [email or web address]  04/10/2018, 1:25 AM

## 2018-04-10 NOTE — Progress Notes (Signed)
PT Cancellation Note  Patient Details Name: Amy Shepard MRN: [id number] DOB: 05-Jul-1946   Cancelled Treatment:    Reason Eval/Treat Not Completed: Patient at procedure or test/unavailable. Pt having bedside echo. PT will continue to follow acutely.    [place] 04/10/2018, 8:37 AM

## 2018-04-10 NOTE — Progress Notes (Signed)
  Echocardiogram 2D Echocardiogram has been performed.  Johny Chess 04/10/2018, 9:18 AM

## 2018-04-11 DIAGNOSIS — J9611 Chronic respiratory failure with hypoxia: Secondary | ICD-10-CM | POA: Diagnosis not present

## 2018-04-11 DIAGNOSIS — S82832A Other fracture of upper and lower end of left fibula, initial encounter for closed fracture: Secondary | ICD-10-CM

## 2018-04-11 DIAGNOSIS — N184 Chronic kidney disease, stage 4 (severe): Secondary | ICD-10-CM | POA: Diagnosis not present

## 2018-04-11 DIAGNOSIS — R55 Syncope and collapse: Secondary | ICD-10-CM | POA: Diagnosis not present

## 2018-04-11 LAB — BASIC METABOLIC PANEL
Anion gap: 11 (ref 5–15)
BUN: 30 mg/dL — AB (ref 8–23)
CHLORIDE: 97 mmol/L — AB (ref 98–111)
CO2: 29 mmol/L (ref 22–32)
Calcium: 9.1 mg/dL (ref 8.9–10.3)
Creatinine, Ser: 1.37 mg/dL — ABNORMAL HIGH (ref 0.44–1.00)
GFR calc Af Amer: 44 mL/min — ABNORMAL LOW (ref >60)
GFR calc non Af Amer: 38 mL/min — ABNORMAL LOW (ref >60)
GLUCOSE: 237 mg/dL — AB (ref 70–99)
Potassium: 3.5 mmol/L (ref 3.5–5.1)
Sodium: 137 mmol/L (ref 135–145)

## 2018-04-11 LAB — GLUCOSE, CAPILLARY
Glucose-Capillary: 210 mg/dL — ABNORMAL HIGH (ref 70–99)
Glucose-Capillary: 179 mg/dL — ABNORMAL HIGH (ref 70–99)

## 2018-04-11 LAB — HEMOGLOBIN A1C
HEMOGLOBIN A1C: 7.2 % — AB (ref 4.8–5.6)
MEAN PLASMA GLUCOSE: 159.94 mg/dL

## 2018-04-11 MED ORDER — POTASSIUM CHLORIDE CRYS ER 20 MEQ PO TBCR
40.0000 meq | EXTENDED_RELEASE_TABLET | Freq: Once | ORAL | Status: AC
  Administered 2018-04-11: 40 meq via ORAL
  Filled 2018-04-11: qty 2

## 2018-04-11 NOTE — Progress Notes (Signed)
PT Cancellation Note  Patient Details Name: Amy Shepard MRN: [id number] DOB: 1946/06/07   Cancelled Treatment:    Reason Eval/Treat Not Completed: Patient not medically ready  Patient does not have her cam walker. Family cannot bring it. Spoke with RN and she plans to contact [facility] to bring new cam walker with plan to discharge around lunch time.   Will return once she has the cam walker.   [name], PT 04/11/2018, 9:55 AM  Pager [phone]

## 2018-04-11 NOTE — Discharge Instructions (Signed)
Follow up with PCP 1-2 weeks for evaluation of BP control, diabetes control Recommend bmet to evaluate potassium level and kidney function Follow up with [facility] Monday or Wed at 8:30 am for evaluation foot fracture or with your own orthopedic physician

## 2018-04-11 NOTE — Care Management Note (Signed)
Case Management Note  Patient Details  Name: AJAHNAE RATHGEBER MRN: [id number] Date of Birth: [birth year]  Subjective/Objective:                    Action/Plan:  Spoke with patient, she states she would like to continue [facility]. SHe states she will also need a tank of O2 for transport home. She states she has a ride.  Referral accepted by [facility], [place] also notified that patientwill need tank to room today for DC. No other CM needs identified. Expected Discharge Date:  04/11/18               Expected Discharge Plan:  [place]  In-House Referral:     Discharge planning Services  CM Consult  Post Acute Care Choice:  Home Health Choice offered to:  Patient  DME Arranged:    DME Agency:     HH Arranged:  PT [place]:  [place]  Status of Service:  Completed, signed off  If discussed at [place] of Stay Meetings, dates discussed:    Additional Comments:  Carles Collet, RN 04/11/2018, 1:56 PM

## 2018-04-11 NOTE — Progress Notes (Signed)
Discharge instructions given to patient, she is waiting for her son to come pick her up. All questions are answered and patient is hooked to the oxygen tank in her room on 3L.

## 2018-04-11 NOTE — Progress Notes (Signed)
Physical Therapy Treatment Patient Details Name: Amy Shepard MRN: [id number] DOB: 1946/04/17 Today's Date: 04/11/2018    History of Present Illness Pt is a 72 y/o female admitted secondary to having a near syncopal episode when getting out of her car. Pt found to have EKG with incomplete RBBB and LAFB, slightly prolonged QT, similar to prior. PMH including but not limited to CAD, CHF, COPD (O2 dependent), CKD, DM and HTN.    PT Comments    Patient sitting with feet on the floor on arrival with cam walker donned on LLE. Reporting increased edema bil LEs and incr lt ankle pain. Educated on importance of elevating her legs when sitting. Patient able to walk with cam walker and RW up to 50 ft with assist for O2 tank. She has an antalgic pattern with incr weight-bearing on bil UEs on RW. She feels she is close to her baseline and is ready to discharge home when her son comes to get her.      Follow Up Recommendations  Home health PT;Supervision for mobility/OOB     Equipment Recommendations  None recommended by PT    Recommendations for Other Services       Precautions / Restrictions Precautions Precautions: Fall Restrictions Weight Bearing Restrictions: No    Mobility  Bed Mobility                  Transfers Overall transfer level: Modified independent Equipment used: Rolling walker (2 wheeled) Transfers: Sit to/from Stand Sit to Stand: Modified independent (Device/Increase time)            Ambulation/Gait Ambulation/Gait assistance: Min guard Gait Distance (Feet): 50 Feet Assistive device: Rolling walker (2 wheeled)(left cam walker) Gait Pattern/deviations: Decreased stride length;Step-to pattern;Decreased stance time - left;Decreased weight shift to left;Antalgic Gait velocity: decreased   General Gait Details: pt with slow, cautious gait but steady overall with RW; min guard for safety; pt limited secondary to fatigue and left ankle  pain   Stairs             Wheelchair Mobility    Modified Rankin (Stroke Patients Only)       Balance                                            Cognition Arousal/Alertness: Awake/alert Behavior During Therapy: WFL for tasks assessed/performed Overall Cognitive Status: Within Functional Limits for tasks assessed                                        Exercises      General Comments General comments (skin integrity, edema, etc.): on 3L O2      Pertinent Vitals/Pain Pain Assessment: No/denies pain    Home Living                      Prior Function            PT Goals (current goals can now be found in the care plan section) Acute Rehab PT Goals Patient Stated Goal: return home  Time For Goal Achievement: 04/24/18 Potential to Achieve Goals: Good Progress towards PT goals: Progressing toward goals    Frequency    Min 3X/week      PT Plan Current plan remains appropriate  Co-evaluation              AM-PAC PT "6 Clicks" Daily Activity  Outcome Measure  Difficulty turning over in bed (including adjusting bedclothes, sheets and blankets)?: None Difficulty moving from lying on back to sitting on the side of the bed? : None Difficulty sitting down on and standing up from a chair with arms (e.g., wheelchair, bedside commode, etc,.)?: A Little Help needed moving to and from a bed to chair (including a wheelchair)?: A Little Help needed walking in hospital room?: A Little Help needed climbing 3-5 steps with a railing? : A Little 6 Click Score: 20    End of Session Equipment Utilized During Treatment: Gait belt;Oxygen Activity Tolerance: Patient limited by fatigue;Patient limited by pain Patient left: in chair;with call bell/phone within reach Nurse Communication: Mobility status;Other (comment)(pt reports will need O2 for transport home) PT Visit Diagnosis: Other abnormalities of gait and mobility  (R26.89);Muscle weakness (generalized) (M62.81)     Time: 7711-6579 PT Time Calculation (min) (ACUTE ONLY): 15 min  Charges:  $Gait Training: 8-22 mins                        [employer], PT 04/11/2018, 1:42 PM

## 2018-04-11 NOTE — Discharge Summary (Addendum)
Physician Discharge Summary  Amy Shepard:[id number] DOB: 12/08/1945 DOA: 04/09/2018  PCP: Maurice Small, MD  Admit date: 04/09/2018 Discharge date: 04/11/2018  Time spent: minutes  Recommendations for Outpatient Follow-up:  1. Follow up with PCP 1-2 weeks for evaluation of BP control, diabetes control and evaluation of kidney function 2. Follow up with orthopedics Monday or Wednesday at 8:30am for foot fracture   Discharge Diagnoses:  Principal Problem:   Near syncope Active Problems:   Chronic obstructive pulmonary disease ([name])   Type 2 diabetes mellitus with complication, with long-term current use of insulin ([name])   HTN (hypertension)   Chronic diastolic CHF (congestive heart failure) ([name])   Chronic respiratory failure with hypoxia ([name])   Anemia due to stage 4 chronic kidney disease ([place])   Pulmonary HTN ([name])   CKD (chronic kidney disease), stage IV ([name])   Pain in joint, ankle and foot   Closed left fibular fracture   Discharge Condition: stable  Diet recommendation: heart healthy carb modified  Filed Weights   04/09/18 1717 04/10/18 0014 04/11/18 0520  Weight: 78.9 kg 79.1 kg 78.6 kg    History of present illness:  Amy Shepard a 72 y.o.femalewith medical history significant forCKD 4, chronic diastolic CHF, pulmonary hypertension, COPD with chronic hypoxic respiratory failure, chronic anemia, and insulin-dependent diabetes mellitus admitted 9/28 after near syncopal episode. Patient reported that she had been in her usual state and was getting out of her car when she became acutely lightheaded and felt as if she is going to pass out. She was given 350cc IV fluid per ems. She felt as tho her legs were more swollen than ususl  Hospital Course:  1.  Near-syncope -Patient feels new medications (spironolactone and Opsumit) started about 1 month ago contributing to dizzinessEKG with incomplete RBBB and LAFB, slightly prolonged QT, similar to  priorThere was 17 mmHg drop in SBP upon standing, but appeared hypervolemic on admission. No events on tele. Oxygen saturation level greater 90% on her usual 3L. Echo with EF 55%, mild LVH, grade 1 diastolic dysfunction. Holding aldactone and Opsumit. Follow up with pulmonology/cardiology regarding medication-- has appointment in a few days  2.COPD; chronic hypoxic respiratory failure -stable at baseline at time of discharge.Continue ICS/LABA and prn nebs, continue supplemental O2  3.Chronic diastolic CHF; Pulmonary HTN -Appeared a little hypervolemic on admission per chart. No increased O2-requirement or edema on CXR. Weight stable. Echo results as noted above  4.CKD stage IV -SCr is 1.37 at discharge. This appears to be close to baseline  5.Insulin-dependent DM -A1c was 14.4% a year agoand 7.2 now. Managed at home with Trulicity, Levemir 36 units qHS, and Humalog per sliding-scale. Recommend close follow up with PCP for optimal control  6.Anemia -Hgb stable at baseline. Denies melena or hematochezia.Likely secondary to CKD  7. Left ankle pain//distal fibular metaphysis fracture per xray. Complained worsening left ankle pain since yesterday.  -cam boot -follow up with orthopedics ASAP -PT  Procedures:  echo  Consultations:  ortho  Discharge Exam: Vitals:   04/11/18 0520 04/11/18 0847  BP: 137/64   Pulse: 67 70  Resp: 18 18  Temp: 97.7 F (36.5 C)   SpO2: 96% 92%    General: sitting in chair eating in no acute distress Cardiovascular: rrr no mgr no le edema Respiratory: normal effort BS clear bilaterally no wheeze  Discharge Instructions   Discharge Instructions    Diet - low sodium heart healthy   Complete by:  As directed  Diet - low sodium heart healthy   Complete by:  As directed    Discharge instructions   Complete by:  As directed    Follow up with PCP 1-2 weeks for evaluation of diabetes control. Recommend BMET for  evaluation of potassium level   Increase activity slowly   Complete by:  As directed    Increase activity slowly   Complete by:  As directed      Allergies as of 04/11/2018      Reactions   Amoxicillin Anaphylaxis, Hives, Rash, Other (See Comments)   Has patient had a PCN reaction causing immediate rash, facial/tongue/throat swelling, SOB or lightheadedness with hypotension: YES Positive reaction causing SEVERE RASH INVOLVING MUCUS MEMBRANES/SKIN NECROSIS: YES Reaction that required HOSPITALIZATION: YES Reaction occurring within the last 10 years: NO      Medication List    TAKE these medications   acetaminophen 325 MG tablet Commonly known as:  TYLENOL Take 650 mg by mouth every 6 (six) hours as needed for moderate pain or fever.   aspirin EC 81 MG tablet Take 81 mg by mouth daily.   Fluticasone-Salmeterol 250-50 MCG/DOSE Aepb Commonly known as:  ADVAIR Inhale 1 puff into the lungs 2 (two) times daily as needed (shortness of breath/wheezing).   furosemide 40 MG tablet Commonly known as:  LASIX TAKE 1 TABLET BY MOUTH TWICE A DAY What changed:  when to take this   gabapentin 300 MG capsule Commonly known as:  NEURONTIN Take 300 mg by mouth 3 (three) times daily.   guaiFENesin 600 MG 12 hr tablet Commonly known as:  MUCINEX Take 1 tablet (600 mg total) by mouth 2 (two) times daily as needed for cough or to loosen phlegm.   insulin lispro 100 UNIT/ML injection Commonly known as:  HUMALOG Inject 2-11 Units into the skin See admin instructions. Inject 6 units with each meal and add additional units per sliding scale: BGL 101-150 = 2 units; 151-200 = 3 units; 201-250 = 5 units; 251-300 = 7 units; 301-350 = 9 units; >350 = 11 units; CALL MD IF BGL IS >400 OR <60   ipratropium-albuterol 0.5-2.5 (3) MG/3ML Soln Commonly known as:  DUONEB Take 3 mLs by nebulization every 4 (four) hours as needed (wheezing, Shortness of breath).   LEVEMIR FLEXTOUCH 100 UNIT/ML Pen Generic  drug:  Insulin Detemir Inject 36 Units into the skin at bedtime.   macitentan 10 MG tablet Commonly known as:  OPSUMIT Take 1 tablet (10 mg total) by mouth daily.   OXYGEN Inhale 3 L into the lungs continuous. FOR COPD   potassium chloride SA 20 MEQ tablet Commonly known as:  K-DUR,KLOR-CON Take 40 meq (2 tabs) in AM, then 20 meq (1 tab) in PM   REFRESH CELLUVISC 1 % Gel Generic drug:  Carboxymethylcellulose Sod PF Place 1 drop into both eyes 2 (two) times daily as needed (dry eyes).   rosuvastatin 40 MG tablet Commonly known as:  CRESTOR Take 1 tablet (40 mg total) by mouth daily at 6 PM.   sertraline 50 MG tablet Commonly known as:  ZOLOFT Take 150 mg by mouth daily.   spironolactone 25 MG tablet Commonly known as:  ALDACTONE TAKE 1/2 TABLET ONCE A DAY What changed:  See the new instructions.   traMADol 50 MG tablet Commonly known as:  ULTRAM Take 1 tablet (50 mg total) by mouth every 8 (eight) hours as needed (for pain).   TRULICITY 3.33 OV/2.9VB Sopn Generic drug:  Dulaglutide Inject 0.5  mLs as directed every Sunday.   Vitamin D 2000 units Caps Take 2,000 Units by mouth daily.      Allergies  Allergen Reactions  . Amoxicillin Anaphylaxis, Hives, Rash and Other (See Comments)    Has patient had a PCN reaction causing immediate rash, facial/tongue/throat swelling, SOB or lightheadedness with hypotension: YES Positive reaction causing SEVERE RASH INVOLVING MUCUS MEMBRANES/SKIN NECROSIS: YES Reaction that required HOSPITALIZATION: YES Reaction occurring within the last 10 years: NO   Follow-up Information    Maurice Small, MD Follow up in 1 week(s).   Specialty:  Family Medicine Contact information: [address] [place] [address] [phone]            The results of significant diagnostics from this hospitalization (including imaging, microbiology, ancillary and laboratory) are listed below for reference.    Significant  Diagnostic Studies: Dg Ankle 2 Views Left  Result Date: 04/10/2018 CLINICAL DATA:  Fall 2 days ago EXAM: LEFT ANKLE - 2 VIEW COMPARISON:  None. FINDINGS: There is a cortical step-off along the distal lateral fibula. Fracture of indeterminate age is suspected. Oblique view is not included. There is soft tissue swelling over the lateral malleolus and anterior to the ankle joint. Osteopenia. Postoperative changes in the midfoot. Spurring at the inferior calcaneus. IMPRESSION: Fracture of indeterminate age of the distal fibula is suspected. Dedicated three view study is recommended. Electronically Signed   By: Marybelle Killings M.D.   On: 04/10/2018 09:45   Dg Chest Portable 1 View  Result Date: 04/09/2018 CLINICAL DATA:  Chest pressure with shortness of breath. EXAM: PORTABLE CHEST 1 VIEW COMPARISON:  Multiple priors most recent 03/11/2018. FINDINGS: Cardiomegaly. Bibasilar scarring with hyperinflation. Calcified tortuous aorta. Surgical clips RIGHT lung reflect previous thoracotomy. No consolidation or edema. Osteopenia. IMPRESSION: Cardiomegaly with COPD.  No active disease. Electronically Signed   By: Staci Righter M.D.   On: 04/09/2018 18:16   Dg Ankle Left Port  Result Date: 04/10/2018 CLINICAL DATA:  Left ankle pain.  Status post fall. EXAM: PORTABLE LEFT ANKLE - 2 VIEW COMPARISON:  04/10/2018 FINDINGS: Acute nondisplaced fracture of the distal fibular metaphysis with overlying soft tissue swelling. No other fracture or dislocation. Ankle mortise is intact. Partially visualize is prior arthrodesis of the first TMT joint. IMPRESSION: 1. Acute nondisplaced fracture of the distal fibular metaphysis with overlying soft tissue swelling. Electronically Signed   By: Kathreen Devoid   On: 04/10/2018 13:19    Microbiology: No results found for this or any previous visit (from the past 240 hour(s)).   Labs: Basic Metabolic Panel: Recent Labs  Lab 04/09/18 1749 04/10/18 0547 04/11/18 0734  NA 141 139 137  K  3.9 3.9 3.5  CL 100 100 97*  CO2 _0 GLUCOSE 127* 149* 237*  BUN 34* 31* 30*  CREATININE 1.32* 1.32* 1.37*  CALCIUM 9.1 9.0 9.1   Liver Function Tests: Recent Labs  Lab 04/09/18 1749  AST 16  ALT 9  ALKPHOS 89  BILITOT 0.7  PROT 6.1*  ALBUMIN 3.3*   No results for input(s): LIPASE, AMYLASE in the last 168 hours. No results for input(s): AMMONIA in the last 168 hours. CBC: Recent Labs  Lab 04/09/18 1749  WBC 8.5  HGB 8.7*  HCT 28.4*  MCV 87.9  PLT 182   Cardiac Enzymes: Recent Labs  Lab 04/09/18 1749  TROPONINI <0.03   BNP: BNP (last 3 results) Recent Labs    06/02/17 1927 03/11/18 1526 04/09/18 1749  BNP 337.7* 246.9* 297.8*    ProBNP (last 3 results) No results for input(s): PROBNP in the last 8760 hours.  CBG: Recent Labs  Lab 04/10/18 1228 04/10/18 1711 04/10/18 2101 04/11/18 0751 04/11/18 1215  GLUCAP 104* 193* 142* 210* 179*       Signed:  Geradine Girt DO [facility] 04/11/2018, 1:06 PM

## 2018-04-11 NOTE — Progress Notes (Signed)
Patient wanted an oxygen tank to go home with, case manager Debbie notified.

## 2018-04-11 NOTE — Progress Notes (Signed)
Orthopedic Tech Progress Note Patient Details:  Alean Kromer [facility] 1946/01/24 [id number]  Ortho Devices Type of Ortho Device: CAM walker Ortho Device/Splint Location: lle Ortho Device/Splint Interventions: Application   Post Interventions Patient Tolerated: Well Instructions Provided: Care of device   [name] 04/11/2018, 10:12 AM

## 2018-04-13 ENCOUNTER — Emergency Department (HOSPITAL_COMMUNITY)
Admission: EM | Admit: 2018-04-13 | Discharge: 2018-04-14 | Disposition: A | Discharge status: Home / Self Care | Payer: Medicare Other | Attending: Emergency Medicine | Admitting: Emergency Medicine

## 2018-04-13 ENCOUNTER — Encounter (HOSPITAL_COMMUNITY): Payer: Self-pay

## 2018-04-13 ENCOUNTER — Emergency Department (HOSPITAL_COMMUNITY): Payer: Medicare Other

## 2018-04-13 ENCOUNTER — Other Ambulatory Visit: Payer: Self-pay

## 2018-04-13 DIAGNOSIS — I5032 Chronic diastolic (congestive) heart failure: Secondary | ICD-10-CM | POA: Insufficient documentation

## 2018-04-13 DIAGNOSIS — H4710 Unspecified papilledema: Secondary | ICD-10-CM | POA: Insufficient documentation

## 2018-04-13 DIAGNOSIS — H538 Other visual disturbances: Secondary | ICD-10-CM | POA: Insufficient documentation

## 2018-04-13 DIAGNOSIS — Z87891 Personal history of nicotine dependence: Secondary | ICD-10-CM | POA: Insufficient documentation

## 2018-04-13 DIAGNOSIS — I130 Hypertensive heart and chronic kidney disease with heart failure and stage 1 through stage 4 chronic kidney disease, or unspecified chronic kidney disease: Secondary | ICD-10-CM | POA: Diagnosis not present

## 2018-04-13 DIAGNOSIS — N184 Chronic kidney disease, stage 4 (severe): Secondary | ICD-10-CM | POA: Insufficient documentation

## 2018-04-13 DIAGNOSIS — Z7982 Long term (current) use of aspirin: Secondary | ICD-10-CM | POA: Diagnosis not present

## 2018-04-13 DIAGNOSIS — Z79899 Other long term (current) drug therapy: Secondary | ICD-10-CM | POA: Insufficient documentation

## 2018-04-13 DIAGNOSIS — I2510 Atherosclerotic heart disease of native coronary artery without angina pectoris: Secondary | ICD-10-CM | POA: Insufficient documentation

## 2018-04-13 DIAGNOSIS — E1122 Type 2 diabetes mellitus with diabetic chronic kidney disease: Secondary | ICD-10-CM | POA: Diagnosis not present

## 2018-04-13 DIAGNOSIS — J449 Chronic obstructive pulmonary disease, unspecified: Secondary | ICD-10-CM | POA: Diagnosis not present

## 2018-04-13 DIAGNOSIS — Z85118 Personal history of other malignant neoplasm of bronchus and lung: Secondary | ICD-10-CM | POA: Insufficient documentation

## 2018-04-13 LAB — COMPREHENSIVE METABOLIC PANEL
ALK PHOS: 97 U/L (ref 38–126)
ALT: 9 U/L (ref 0–44)
AST: 19 U/L (ref 15–41)
Albumin: 3.8 g/dL (ref 3.5–5.0)
Anion gap: 12 (ref 5–15)
BILIRUBIN TOTAL: 0.5 mg/dL (ref 0.3–1.2)
BUN: 38 mg/dL — ABNORMAL HIGH (ref 8–23)
CALCIUM: 9.5 mg/dL (ref 8.9–10.3)
CHLORIDE: 97 mmol/L — AB (ref 98–111)
CO2: 31 mmol/L (ref 22–32)
CREATININE: 1.53 mg/dL — AB (ref 0.44–1.00)
GFR calc Af Amer: 38 mL/min — ABNORMAL LOW (ref >60)
GFR, EST NON AFRICAN AMERICAN: 33 mL/min — AB (ref >60)
Glucose, Bld: 105 mg/dL — ABNORMAL HIGH (ref 70–99)
Potassium: 3.6 mmol/L (ref 3.5–5.1)
Sodium: 140 mmol/L (ref 135–145)
TOTAL PROTEIN: 7.4 g/dL (ref 6.5–8.1)

## 2018-04-13 LAB — SEDIMENTATION RATE: Sed Rate: 83 mm/hr — ABNORMAL HIGH (ref 0–22)

## 2018-04-13 MED ORDER — LORAZEPAM 2 MG/ML IJ SOLN
1.0000 mg | Freq: Once | INTRAMUSCULAR | Status: AC
  Administered 2018-04-13: 1 mg via INTRAVENOUS
  Filled 2018-04-13: qty 1

## 2018-04-13 MED ORDER — SODIUM CHLORIDE 0.9 % IV BOLUS: 1000.0000 mL | Freq: Once | INTRAVENOUS | Status: DC

## 2018-04-13 MED ORDER — SODIUM CHLORIDE 0.9 % IV BOLUS
500.0000 mL | Freq: Once | INTRAVENOUS | Status: AC
  Administered 2018-04-13: 500 mL via INTRAVENOUS

## 2018-04-13 MED ORDER — LORAZEPAM 2 MG/ML IJ SOLN: 0.5000 mg | Freq: Once | INTRAMUSCULAR | Status: DC

## 2018-04-13 MED ORDER — GADOBUTROL 1 MMOL/ML IV SOLN
10.0000 mL | Freq: Once | INTRAVENOUS | Status: AC | PRN
  Administered 2018-04-13: 10 mL via INTRAVENOUS

## 2018-04-13 NOTE — ED Triage Notes (Signed)
Patient reports that her vision went gray and blurry approx 1 week ago. Patient went to her eye doctor and reported that she can not read any more.  Eye doctor : optic disc edema OS>OD. Discharge papers state Recommend MRI Brain and orbits, rule out brain tumor/cancer and compressive nerve lesion or stroke.

## 2018-04-13 NOTE — Discharge Instructions (Signed)
All imaging today was normal, please follow up with Dr. Alanda Slim as needed.If you experience any worsening symptoms please return to the ED for reevaluation.

## 2018-04-13 NOTE — ED Provider Notes (Addendum)
[facility] DEPT Provider Note   CSN: [id number] Arrival date & time: 04/13/18  1701     History   Chief Complaint Chief Complaint  Patient presents with  . Vision problems    HPI Amy Shepard is a 72 y.o. female.  72 y.o female with a PMH of CAD, DM, Lung CA presents to the ED sent in by Dr. Alanda Slim who saw patient this afternoon and sent her to the ED for further evaluation. Patient was seen by Dr. Alanda Slim today for her diabetic eye exam. She reports she's had some "blurry vision" and "foggy vision" the past two days. She reports her vision has slightly been decreased.  Patient was recently admitted to the hospital and discharged on 04/09/2018, a near syncopal episode.  Patient denies any other complaints at this time. Dr. Alanda Slim wanted to place her on medication to treat her condition but wanted to make sure we obtain an MRI brain and orbits to rule out brain/tumor cancer, compressive nerve lesion, or stroke.  She denies any shortness of breath, headache, chest pain, syncopal episodes, dizziness or lightheadedness.     Past Medical History:  Diagnosis Date  . Arthritis   . CAD (coronary artery disease)    a. Prior cath 2015 showed 40% prox AD, 50-50% mLAD, otherwise calcification but no obstruction in LCx/RCA.. Medical management recommended. b. 2017: low-risk NST.  [name] Chronic diastolic CHF (congestive heart failure) ([place])   . Chronic respiratory failure ([place])   . CKD (chronic kidney disease), stage III ([place])   . COPD (chronic obstructive pulmonary disease) ([place])   . Cor pulmonale ([name])    a. felt due to advanced COPD and noncompliance with O2.  . Depression   . Diabetes mellitus    [name]    dx  2008  . Fracture    left foot  . Hx of seasonal allergies   . Hypercholesteremia   . Hypertension    on no medications  . Lung cancer ([place])   . On home oxygen therapy    "2.5L; 24/7" (03/10/2017)  . Pericardial effusion    a. small-moderate  in 07/2016.  . Pulmonary hypertension ([place])   . UTI (urinary tract infection)     Patient Active Problem List   Diagnosis Date Noted  . Pain in joint, ankle and foot 04/10/2018  . Closed left fibular fracture 04/10/2018  . CKD (chronic kidney disease), stage IV ([place]) 04/09/2018  . Near syncope 04/09/2018  . Dyspnea   . Bronchitis, acute 03/11/2018  . Fatigue 03/11/2018  . Anemia due to stage 4 chronic kidney disease ([place]) 03/11/2018  . Pulmonary fibrosis ([place]) 03/11/2018  . Pulmonary HTN ([place]) 03/11/2018  . Anemia 03/11/2018  . Lisfranc dislocation, left, subsequent encounter 06/08/2017  . Chronic respiratory failure with hypoxia ([place]) 06/03/2017  . Falls 06/03/2017  . Hypokalemia 06/03/2017  . Sepsis secondary to UTI ([place]) 06/02/2017  . Dog bite 05/02/2017  . CHF exacerbation ([place]) 05/02/2017  . Acute diastolic CHF (congestive heart failure) ([place]) 05/02/2017  . Chronic diastolic CHF (congestive heart failure) ([place]) 04/02/2017  . Cor pulmonale ([place]) 04/02/2017  . Acute on chronic respiratory failure ([place]) 03/10/2017  . Chronic pain 03/10/2017  . Lactic acidosis 02/10/2017  . Acute respiratory failure with hypoxia ([place]) 02/09/2017  . Acute on chronic diastolic CHF (congestive heart failure) ([place]) 06/23/2016  . HTN (hypertension) 06/23/2016  . Type 2 diabetes mellitus with complication, with long-term current use of insulin ([place]) 03/11/2016  . Depression  03/11/2016  . Chronic obstructive pulmonary disease ([place]) 03/10/2016  . S/P lumbar spinal fusion 02/22/2016  . Coronary artery disease involving native coronary artery 07/12/2014  . Pain in the chest   . Malignant neoplasm of lower lobe of right lung ([place]) 05/25/2014  . Lung cancer ([place]) 09/01/2012    Past Surgical History:  Procedure Laterality Date  . [facility] in [place]     bilateral cataracts  . LEFT HEART CATHETERIZATION WITH CORONARY ANGIOGRAM N/A  07/12/2014   Procedure: LEFT HEART CATHETERIZATION WITH CORONARY ANGIOGRAM;  Surgeon: [name], MD;  Location: [facility] C[facility];  Service: Cardiovascular;  Laterality: N/A;  . MAXIMUM ACCESS (MAS)POSTERIOR LUMBAR INTERBODY FUSION (PLIF) 2 LEVEL N/A 02/22/2016   Procedure: Lumbar three-four - Lumbar four-five  MAXIMUM ACCESS SURGERY  POSTERIOR LUMBAR INTERBODY FUSION;  Surgeon: [name], MD;  Location: [facility];  Service: Neurosurgery;  Laterality: N/A;  . ORIF TOE FRACTURE Left 06/12/2017   Procedure: OPEN REDUCTION INTERNAL FIXATION (ORIF) BASE 1ST METATARSAL (TOE) FRACTURE;  Surgeon: Newt Minion, MD;  Location: [facility];  Service: Orthopedics;  Laterality: Left;  . RIGHT HEART CATH N/A 08/06/2017   Procedure: RIGHT HEART CATH;  Surgeon: Larey Dresser, MD;  Location: [facility] CV LAB;  Service: Cardiovascular;  Laterality: N/A;  . THORACOTOMY Right 2010   lower  . TONSILLECTOMY       OB History   None      Home Medications    Prior to Admission medications   Medication Sig Start Date End Date Taking? Authorizing Provider  acetaminophen (TYLENOL) 325 MG tablet Take 650 mg by mouth every 6 (six) hours as needed for moderate pain or fever.    Yes [provider]  aspirin EC 81 MG tablet Take 81 mg by mouth daily.    Yes [provider]  Cholecalciferol (VITAMIN D) 2000 units CAPS Take 2,000 Units by mouth daily.   Yes [provider]  Fluticasone-Salmeterol (ADVAIR) 250-50 MCG/DOSE AEPB Inhale 1 puff into the lungs 2 (two) times daily as needed (shortness of breath/wheezing).    Yes [provider]  furosemide (LASIX) 40 MG tablet TAKE 1 TABLET BY MOUTH TWICE A DAY Patient taking differently: Take 40 mg by mouth 2 (two) times daily.  02/23/18  Yes Larey Dresser, MD  gabapentin (NEURONTIN) 300 MG capsule Take 300 mg by mouth 3 (three) times daily.   Yes [provider]  Insulin Detemir (LEVEMIR FLEXTOUCH) 100 UNIT/ML Pen Inject  36 Units into the skin at bedtime.    Yes [provider]  insulin lispro (HUMALOG) 100 UNIT/ML injection Inject 2-11 Units into the skin See admin instructions. Inject 6 units with each meal and add additional units per sliding scale: BGL 101-150 = 2 units; 151-200 = 3 units; 201-250 = 5 units; 251-300 = 7 units; 301-350 = 9 units; >350 = 11 units; CALL MD IF BGL IS >400 OR <60   Yes [provider]  OXYGEN Inhale 3 L into the lungs continuous. FOR COPD   Yes [provider]  sertraline (ZOLOFT) 50 MG tablet Take 150 mg by mouth daily.    Yes [provider]  spironolactone (ALDACTONE) 25 MG tablet TAKE 1/2 TABLET ONCE A DAY Patient taking differently: Take 12.5 mg by mouth daily.  02/23/18  Yes Larey Dresser, MD  TRULICITY 2.97 LG/9.2JJ SOPN Inject  0.5 mLs as directed every Sunday.  01/28/18  Yes [provider]  guaiFENesin (MUCINEX) 600 MG 12 hr tablet Take 1 tablet (600 mg total) by mouth 2 (two) times daily as needed for cough or to loosen phlegm. Patient not taking: Reported on 04/13/2018 05/07/17   Aline August, MD  ipratropium-albuterol (DUONEB) 0.5-2.5 (3) MG/3ML SOLN Take 3 mLs by nebulization every 4 (four) hours as needed (wheezing, Shortness of breath). 03/13/16   Johnson, Clanford L, MD  Macitentan (OPSUMIT) 10 MG TABS Take 1 tablet (10 mg total) by mouth daily. Patient not taking: Reported on 04/13/2018 08/17/17   Larey Dresser, MD  potassium chloride SA (K-DUR,KLOR-CON) 20 MEQ tablet Take 40 meq (2 tabs) in AM, then 20 meq (1 tab) in PM Patient not taking: Reported on 04/13/2018 10/27/17   Larey Dresser, MD  rosuvastatin (CRESTOR) 40 MG tablet Take 1 tablet (40 mg total) by mouth daily at 6 PM. Patient not taking: Reported on 04/13/2018 10/23/17   Larey Dresser, MD  traMADol (ULTRAM) 50 MG tablet Take 1 tablet (50 mg total) by mouth every 8 (eight) hours as needed (for pain). Patient not taking: Reported on 04/13/2018 06/06/17    Edwin Dada, MD    Family History Family History  Problem Relation Age of Onset  . Diabetes Father   . Heart failure Father   . Cancer Sister   . Diabetes Brother   . Heart failure Brother     Social History Social History   Tobacco Use  . Smoking status: Former Smoker    Packs/day: 1.00    Years: 30.00    Pack years: 30.00    Last attempt to quit: 07/14/2006    Years since quitting: 11.7  . Smokeless tobacco: Never Used  Substance Use Topics  . Alcohol use: No  . Drug use: No     Allergies   Amoxicillin   Review of Systems Review of Systems  Constitutional: Negative for chills and fever.  HENT: Negative for sore throat.   Eyes: Positive for visual disturbance. Negative for photophobia.  Respiratory: Negative for shortness of breath.   Cardiovascular: Negative for chest pain.  Gastrointestinal: Negative for abdominal pain, diarrhea, nausea and vomiting.  Genitourinary: Negative for flank pain and hematuria.  Musculoskeletal: Negative for back pain.  Skin: Negative for pallor and wound.  Neurological: Negative for light-headedness and headaches.  All other systems reviewed and are negative.    Physical Exam Updated Vital Signs BP (!) 108/59 (BP Location: Left Arm)   Pulse 74 Comment: Simultaneous filing. User may not have seen previous data.  Temp 98 F (36.7 C) (Oral)   Resp 18   Ht _0  (1.549 m)   Wt 78.6 kg   SpO2 91% Comment: Simultaneous filing. User may not have seen previous data.  BMI 32.74 kg/m   Physical Exam  Constitutional: She is oriented to person, place, and time. She appears well-developed and well-nourished.  HENT:  Head: Normocephalic and atraumatic.  Eyes: Right eye exhibits normal extraocular motion and no nystagmus. Left eye exhibits normal extraocular motion and no nystagmus. Right pupil is round and reactive. Left pupil is round and reactive. Pupils are equal.  Neck: Normal range of motion.  Cardiovascular: Normal  heart sounds.  Pulmonary/Chest: Effort normal.  Abdominal: Soft. There is no tenderness.  Musculoskeletal: She exhibits no tenderness.  Neurological: She is alert and oriented to person, place, and time.  Skin: Skin is warm and dry.  Nursing  note and vitals reviewed.    ED Treatments / Results  Labs (all labs ordered are listed, but only abnormal results are displayed) Labs Reviewed  COMPREHENSIVE METABOLIC PANEL - Abnormal; Notable for the following components:      Result Value   Chloride 97 (*)    Glucose, Bld 105 (*)    BUN 38 (*)    Creatinine, Ser 1.53 (*)    GFR calc non Af Amer 33 (*)    GFR calc Af Amer 38 (*)    All other components within normal limits  SEDIMENTATION RATE  C-REACTIVE PROTEIN    EKG None  Radiology Mr Jeri Cos And Wo Contrast  Result Date: 04/13/2018 CLINICAL DATA:  Initial evaluation for recent visual disturbance, optic disc edema. Evaluate for brain tumor, compressive nerve lesion, stroke. EXAM: MRI HEAD AND ORBITS WITHOUT AND WITH CONTRAST TECHNIQUE: Multiplanar, multiecho pulse sequences of the brain and surrounding structures were obtained without and with intravenous contrast. Multiplanar, multiecho pulse sequences of the orbits and surrounding structures were obtained including fat saturation techniques, before and after intravenous contrast administration. CONTRAST:  10 cc of Gadavist. COMPARISON:  Prior CT from 03/11/2018 FINDINGS: MRI HEAD FINDINGS Brain: Examination moderate to severely degraded by motion artifact. Generalized age-related cerebral atrophy. Patchy and confluent T2/FLAIR hyperintensity within the periventricular and deep white matter both cerebral hemispheres, most like related chronic small vessel ischemic disease, mild for age. No evidence for acute or subacute infarct. Gray-white matter differentiation maintained. No areas of remote cortical infarction. No foci of susceptibility artifact to suggest acute or chronic intracranial  hemorrhage. No mass lesion, midline shift or mass effect. No hydrocephalus. No extra-axial fluid collection. Pituitary gland grossly normal. No definite abnormal enhancement, although evaluation limited by motion artifact. Probable DVA noted at the right occipital lobe. Vascular: Major intracranial vascular flow voids are maintained. Skull and upper cervical spine: Craniocervical junction normal. No focal marrow replacing lesion. Scalp soft tissues unremarkable. Other: Trace opacity bilateral mastoid air cells, of doubtful significance. MRI ORBITS FINDINGS Orbits: Examination severely degraded by motion artifact. Globes are symmetric in size with grossly normal appearance and morphology bilaterally. Patient status post ocular lens replacement bilaterally. No obvious optic nerve edema or enhancement, although evaluation fairly limited. Extra-ocular muscles symmetric and grossly normal. Superior orbital veins within normal limits. Lacrimal glands normal. Intraconal and extraconal fat maintained. No abnormality at the orbital apices. Visualized sinuses: Visualized paranasal sinuses are clear. Soft tissues: Appear oval soft tissues within normal limits. IMPRESSION: 1. Limited exam due to extensive motion artifact. No definite acute intracranial abnormality identified. No structural findings to explain patient's symptoms identified. 2. Generalized age-related cerebral atrophy with mild chronic small vessel ischemic disease. Electronically Signed   By: Jeannine Boga M.D.   On: 04/13/2018 22:30   Mr Rosealee Albee HK Contrast  Result Date: 04/13/2018 CLINICAL DATA:  Initial evaluation for recent visual disturbance, optic disc edema. Evaluate for brain tumor, compressive nerve lesion, stroke. EXAM: MRI HEAD AND ORBITS WITHOUT AND WITH CONTRAST TECHNIQUE: Multiplanar, multiecho pulse sequences of the brain and surrounding structures were obtained without and with intravenous contrast. Multiplanar, multiecho pulse  sequences of the orbits and surrounding structures were obtained including fat saturation techniques, before and after intravenous contrast administration. CONTRAST:  10 cc of Gadavist. COMPARISON:  Prior CT from 03/11/2018 FINDINGS: MRI HEAD FINDINGS Brain: Examination moderate to severely degraded by motion artifact. Generalized age-related cerebral atrophy. Patchy and confluent T2/FLAIR hyperintensity within the periventricular and deep white matter both cerebral  hemispheres, most like related chronic small vessel ischemic disease, mild for age. No evidence for acute or subacute infarct. Gray-white matter differentiation maintained. No areas of remote cortical infarction. No foci of susceptibility artifact to suggest acute or chronic intracranial hemorrhage. No mass lesion, midline shift or mass effect. No hydrocephalus. No extra-axial fluid collection. Pituitary gland grossly normal. No definite abnormal enhancement, although evaluation limited by motion artifact. Probable DVA noted at the right occipital lobe. Vascular: Major intracranial vascular flow voids are maintained. Skull and upper cervical spine: Craniocervical junction normal. No focal marrow replacing lesion. Scalp soft tissues unremarkable. Other: Trace opacity bilateral mastoid air cells, of doubtful significance. MRI ORBITS FINDINGS Orbits: Examination severely degraded by motion artifact. Globes are symmetric in size with grossly normal appearance and morphology bilaterally. Patient status post ocular lens replacement bilaterally. No obvious optic nerve edema or enhancement, although evaluation fairly limited. Extra-ocular muscles symmetric and grossly normal. Superior orbital veins within normal limits. Lacrimal glands normal. Intraconal and extraconal fat maintained. No abnormality at the orbital apices. Visualized sinuses: Visualized paranasal sinuses are clear. Soft tissues: Appear oval soft tissues within normal limits. IMPRESSION: 1.  Limited exam due to extensive motion artifact. No definite acute intracranial abnormality identified. No structural findings to explain patient's symptoms identified. 2. Generalized age-related cerebral atrophy with mild chronic small vessel ischemic disease. Electronically Signed   By: Jeannine Boga M.D.   On: 04/13/2018 22:30    Procedures Procedures (including critical care time)  Medications Ordered in ED Medications  sodium chloride 0.9 % bolus 500 mL (500 mLs Intravenous New Bag/Given 04/13/18 2303)  LORazepam (ATIVAN) injection 1 mg (1 mg Intravenous Given 04/13/18 2034)  gadobutrol (GADAVIST) 1 MMOL/ML injection 10 mL (10 mLs Intravenous Contrast Given 04/13/18 2156)     Initial Impression / Assessment and Plan / ED Course  I have reviewed the triage vital signs and the nursing notes.  Pertinent labs & imaging results that were available during my care of the patient were reviewed by me and considered in my medical decision making (see chart for details).     Patient sent by her ophthalmologist Dr. Alanda Slim who would like "recommend MRI brain and orbits rule out brain tumor/cancer, compressive nerve lesion, or stroke". I discussed this patient with Dr. Ralene Bathe who recommends I consult neurology to make sure we are ordering the correct imaging for this patient.  I have consulted neurology Dr. Malen Gauze who states she would benefit from a contrast exam.  Patient's creatinine is 1.53 will hydrate her with a 541m bolus after MRI brain.  MRI Brain limited exam was due to excessive motion artifact, patient was given 1 mg of Ativan prior to procedure as she gets anxious with his exam.  There was no definite acute intracranial abnormality identified.  No structural findings to explain patient's symptoms identified.  Generalized age-related cerebral atrophy with mild chronic small vessel ischemic disease.  MRI orbits showed no acute intracranial abnormality, no acute findings with her symptoms.  At  this time I will provide patient with 500 mg bolus will discharge patient after infusion has been completed.  She is scheduled to follow-up with Dr. MAlanda Slimas scheduled.   11:12 PM Patient receiving 500 ml bolus, will be discharge after fluids are completed. Transferred care to Rob PA at shift change.   Final Clinical Impressions(s) / ED Diagnoses   Final diagnoses:  Blurry vision, bilateral    ED Discharge Orders    None       Jenelle Drennon,  Beverley Fiedler, PA-C 04/13/18 2254    Janeece Fitting, PA-C 04/13/18 2313    Quintella Reichert, MD 04/15/18 [phone]

## 2018-04-20 ENCOUNTER — Ambulatory Visit (HOSPITAL_COMMUNITY)
Admission: RE | Admit: 2018-04-20 | Discharge: 2018-04-20 | Disposition: A | Discharge status: Home / Self Care | Payer: Medicare Other | Source: Ambulatory Visit | Attending: Cardiology | Admitting: Cardiology

## 2018-04-20 ENCOUNTER — Encounter (HOSPITAL_COMMUNITY): Payer: Medicare Other | Admitting: Cardiology

## 2018-04-20 ENCOUNTER — Other Ambulatory Visit: Payer: Self-pay

## 2018-04-20 VITALS — BP 116/49 | HR 77 | Wt 171.2 lb

## 2018-04-20 DIAGNOSIS — I5032 Chronic diastolic (congestive) heart failure: Secondary | ICD-10-CM | POA: Insufficient documentation

## 2018-04-20 DIAGNOSIS — Z9981 Dependence on supplemental oxygen: Secondary | ICD-10-CM | POA: Insufficient documentation

## 2018-04-20 DIAGNOSIS — M549 Dorsalgia, unspecified: Secondary | ICD-10-CM | POA: Insufficient documentation

## 2018-04-20 DIAGNOSIS — I2510 Atherosclerotic heart disease of native coronary artery without angina pectoris: Secondary | ICD-10-CM | POA: Diagnosis not present

## 2018-04-20 DIAGNOSIS — Z8249 Family history of ischemic heart disease and other diseases of the circulatory system: Secondary | ICD-10-CM | POA: Diagnosis not present

## 2018-04-20 DIAGNOSIS — Z7982 Long term (current) use of aspirin: Secondary | ICD-10-CM | POA: Insufficient documentation

## 2018-04-20 DIAGNOSIS — Z9221 Personal history of antineoplastic chemotherapy: Secondary | ICD-10-CM | POA: Diagnosis not present

## 2018-04-20 DIAGNOSIS — N183 Chronic kidney disease, stage 3 (moderate): Secondary | ICD-10-CM | POA: Insufficient documentation

## 2018-04-20 DIAGNOSIS — J9611 Chronic respiratory failure with hypoxia: Secondary | ICD-10-CM | POA: Insufficient documentation

## 2018-04-20 DIAGNOSIS — J449 Chronic obstructive pulmonary disease, unspecified: Secondary | ICD-10-CM | POA: Insufficient documentation

## 2018-04-20 DIAGNOSIS — I2781 Cor pulmonale (chronic): Secondary | ICD-10-CM | POA: Diagnosis not present

## 2018-04-20 DIAGNOSIS — E1122 Type 2 diabetes mellitus with diabetic chronic kidney disease: Secondary | ICD-10-CM | POA: Diagnosis not present

## 2018-04-20 DIAGNOSIS — I130 Hypertensive heart and chronic kidney disease with heart failure and stage 1 through stage 4 chronic kidney disease, or unspecified chronic kidney disease: Secondary | ICD-10-CM | POA: Diagnosis present

## 2018-04-20 DIAGNOSIS — I272 Pulmonary hypertension, unspecified: Secondary | ICD-10-CM | POA: Diagnosis not present

## 2018-04-20 DIAGNOSIS — I2721 Secondary pulmonary arterial hypertension: Secondary | ICD-10-CM | POA: Diagnosis not present

## 2018-04-20 DIAGNOSIS — E785 Hyperlipidemia, unspecified: Secondary | ICD-10-CM | POA: Diagnosis not present

## 2018-04-20 DIAGNOSIS — Z794 Long term (current) use of insulin: Secondary | ICD-10-CM | POA: Insufficient documentation

## 2018-04-20 DIAGNOSIS — Z79899 Other long term (current) drug therapy: Secondary | ICD-10-CM | POA: Insufficient documentation

## 2018-04-20 DIAGNOSIS — Z85118 Personal history of other malignant neoplasm of bronchus and lung: Secondary | ICD-10-CM | POA: Diagnosis not present

## 2018-04-20 MED ORDER — ATORVASTATIN CALCIUM 40 MG PO TABS: 40.0000 mg | ORAL_TABLET | Freq: Every day | ORAL | 3 refills | Status: DC

## 2018-04-20 MED ORDER — MACITENTAN 10 MG PO TABS: 10.0000 mg | ORAL_TABLET | Freq: Every day | ORAL | Status: DC

## 2018-04-20 MED ORDER — FUROSEMIDE 40 MG PO TABS: 60.0000 mg | ORAL_TABLET | Freq: Two times a day (BID) | ORAL | 3 refills | Status: DC

## 2018-04-20 MED ORDER — POTASSIUM CHLORIDE CRYS ER 20 MEQ PO TBCR: 20.0000 meq | EXTENDED_RELEASE_TABLET | Freq: Every day | ORAL | 6 refills | Status: DC

## 2018-04-20 NOTE — Progress Notes (Signed)
Attempted 6 min walk test.  Pt was only able to ambulate for 2 min and 12 sec then had to stop due to her back.  During that time O2 sats ranged 89-96% on 2 L and pt ambulated 200 ft

## 2018-04-20 NOTE — Progress Notes (Signed)
Referral form and insurance information for [facility] faxed into [facility] at [phone]

## 2018-04-20 NOTE — Patient Instructions (Signed)
Increase Furosemide to 60 mg (1 & 1/2 tabs) Twice daily   Stat Potassium (k-dur) 20 meq daily  Restart Opsumit 10 mg daily  Start Atorvastatin 40 mg daily  Start Selexipeg, this is a specialty medication that comes from a specialty pharmacy, they will contact you before shipping the medication and they will have a nurse come out to your home to educate you on the medication and increase your dose.  Labs in 10 days  Your physician recommends that you schedule a follow-up appointment in: 6-8 weeks

## 2018-04-21 NOTE — Progress Notes (Signed)
PCP: Dr. Justin Mend HF Cardiology: Dr. Aundra Dubin  72 y.o. with history of hypoxemic respiratory failure, chronic diastolic CHF, pulmonary hypertension, prior lung cancer, and CKD stage 3 returns for followup of pulmonary hypertension.  Patient wears home oxygen.  PFTs in 3/18 showed moderate-severe restriction but only minimal obstruction.  CTA chest in 1/18 showed relatively normal lung parenchyma and V/Q scan in 8/18 showed no chronic or acute PE.  Echo in 8/18 showed normal LV EF with dilated and dysfunctional RV.  She was admitted in 10/18 with acute/chronic diastolic CHF and was diuresed with IV Lasix.  She is currently on Lasix 40 mg bid.    Currently, she lives at [name].  We had planned a RHC in the fall but she developed a Lisfranc fracture in her left foot requiring internal fixation and also had an admission with urosepsis.  RHC was postponed. Echo in 11/18 showed EF 55%, RV moderately dilated, PASP 51 mmHg. RHC was later done in 1/19, showing moderate PAH.   Patient was admitted in 9/19 with presyncope. She was orthostatic.  Opsumit, lisinopril, and spironolactone were stopped. She developed visual changes/blurry vision and stopped tadalafil.  Stopping tadalafil did not help.  She is seeing an ophthalmologist for the visual changes, MRI head was unremarkable.  Echo in 9/19 showed EF 55-60% with septal flattening, mildly dilated RV with moderately decreased systolic function, PA systolic pressure 81 mmHg.   Weight is stable.  She is mainly limited by back pain today, unable to do 6 minute walk.  No dyspnea walking on flat ground but walks slowly and not very far generally.  She walks with a walker.   No further lightheadedness.  No chest pain.    6 minute walk (4/19): 137 m  Labs (10/18): K 4, creatinine 1.46, hgb 14.5 Labs (1/19): K 3, creatinine 1.8 => 1.23 Labs (4/19): LDL 257, K 4, creatinine 1.67 Labs (10/19): ESR 83, K 3.6, creatinine 1.5  ECG (personally reviewed): NSR,  iRBBB/RVH  PMH: 1. Chronic hypoxemic respiratory failure: On home oxygen.  2. COPD: This diagnosis has been listed, but PFTs in 3/18 did not show significant obstruction.  3. Chronic diastolic CHF: With cor pulmonale.  - Echo (8/18): EF 65-70%, severe RV dilation with decreased RV systolic function, PASP 67 mmHg.  - Echo (11/18): EF 55%, PASP 51 mmHg, RV moderately dilated.  - Echo (9/19): EF 55-60%, septal flattening, PASP 81 mmHg, RV mildly dilated with moderately decreased systolic function.  4. Type II diabetes 5. HTN 6. Hyperlipidemia 7. CKD: Stage 3.  8. CAD: LHC (2015) with 40% pLAD, 50% mLAD.  9. Lung cancer: Treated in 2010 with chemotherapy and resection.  No recurrence.  10. 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   - V/Q scan negative for chronic PE 8/18.  - CTA chest (1/18): No PE, no emphysema, no ILD. - PFTs (3/18): moderate-severe restriction with minimal obstruction, severely decreased DLCO. FVC 63%, FEV1 72%, ratio 114%.  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  - Sleep study (2/19): No OSA, there is nocturnal hypoxemia.  - RHC (1/19): mean RA 7, PA 55/16 mean 34, mean PCWP 15, CI 2.34.   SH: son and daughter in [place], quit smoking in 2008.   Family History  Problem Relation Age of Onset  . Diabetes Father   . Heart failure Father   . Cancer Sister   . Diabetes Brother   . Heart failure Brother    ROS: All systems reviewed and  negative except as per HPI.  Current Outpatient Medications  Medication Sig Dispense Refill  . acetaminophen (TYLENOL) 325 MG tablet Take 650 mg by mouth every 6 (six) hours as needed for moderate pain or fever.     [name] aspirin EC 81 MG tablet Take 81 mg by mouth daily.     . Cholecalciferol (VITAMIN D) 2000 units CAPS Take 2,000 Units by mouth daily.    . Fluticasone-Salmeterol (ADVAIR) 250-50 MCG/DOSE AEPB Inhale 1 puff into the lungs 2 (two) times daily as needed (shortness of breath/wheezing).      . furosemide (LASIX) 40 MG tablet Take 1.5 tablets (60 mg total) by mouth 2 (two) times daily. 90 tablet 3  . gabapentin (NEURONTIN) 300 MG capsule Take 300 mg by mouth 3 (three) times daily.    [name] guaiFENesin (MUCINEX) 600 MG 12 hr tablet Take 1 tablet (600 mg total) by mouth 2 (two) times daily as needed for cough or to loosen phlegm. 20 tablet 0  . Insulin Detemir (LEVEMIR FLEXTOUCH) 100 UNIT/ML Pen Inject 36 Units into the skin at bedtime.     . insulin lispro (HUMALOG) 100 UNIT/ML injection Inject 2-11 Units into the skin See admin instructions. Inject 6 units with each meal and add additional units per sliding scale: BGL 101-150 = 2 units; 151-200 = 3 units; 201-250 = 5 units; 251-300 = 7 units; 301-350 = 9 units; >350 = 11 units; CALL MD IF BGL IS >400 OR <60    . ipratropium-albuterol (DUONEB) 0.5-2.5 (3) MG/3ML SOLN Take 3 mLs by nebulization every 4 (four) hours as needed (wheezing, Shortness of breath). 360 mL 0  . OXYGEN Inhale 3 L into the lungs continuous. FOR COPD    . sertraline (ZOLOFT) 50 MG tablet Take 150 mg by mouth daily.     [name] spironolactone (ALDACTONE) 25 MG tablet TAKE 1/2 TABLET ONCE A DAY 15 tablet 3  . traMADol (ULTRAM) 50 MG tablet Take 1 tablet (50 mg total) by mouth every 8 (eight) hours as needed (for pain). 10 tablet 0  . TRULICITY 8.32 NV/9.71YO SOPN Inject 0.5 mLs as directed every Sunday.     [name] atorvastatin (LIPITOR) 40 MG tablet Take 1 tablet (40 mg total) by mouth daily. 90 tablet 3  . macitentan (OPSUMIT) 10 MG tablet Take 1 tablet (10 mg total) by mouth daily. 30 tablet   . potassium chloride SA (K-DUR,KLOR-CON) 20 MEQ tablet Take 1 tablet (20 mEq total) by mouth daily. 30 tablet 6   No current facility-administered medications for this encounter.    Facility-Administered Medications Ordered in Other Encounters  Medication Dose Route Frequency Provider Last Rate Last Dose  . diatrizoate meglumine-sodium (GASTROGRAFIN) 66-10 % solution 30 mL  30 mL Oral PRN  MacDiarmid, Nicki Reaper, MD   30 mL at 11/30/15 0820   BP (!) 116/49   Pulse 77   Wt 77.7 kg (171 lb 3.2 oz)   SpO2 97% Comment: on 3 L of O2  BMI 32.35 kg/m  General: NAD General: NAD Neck: JVP 9-10 cm, no thyromegaly or thyroid nodule.  Lungs: Clear to auscultation bilaterally with normal respiratory effort. CV: Nondisplaced PMI.  Heart regular S1/S2, no S3/S4, 2/6 HSM apex.  1+ edema 1/2 to knees bilaterally.  No carotid bruit.  Normal pedal pulses.  Abdomen: Soft, nontender, no hepatosplenomegaly, no distention.  Skin: Intact without lesions or rashes.  Neurologic: Alert and oriented x 3.  Psych: Normal affect. Extremities: No clubbing or cyanosis.  HEENT: Normal.  Assessment/Plan: 1. 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  Restrictive PFTs but lung parenchyma looked relatively normal on 1/18 CTA chest.  Findings by PFTs and CT not consistent with emphysema. V/Q scan negative for chronic or acute PEs. Serologic workup was negative. RHC (1/19) showed moderate pulmonary arterial hypertension. Echo in 8/18 showed dilated and dysfunctional RV, similar in 11/18 and 9/19.  Echo in 7/26 showed PA systolic pressure still very high at 81 mmHg.  Sleep study showed nocturnal hypoxemia but not OSA.  She stopped Opsumit due to presyncope/lightheadedness.  However, I am concerned that this lightheadedness may be due to RV failure in the setting of significant pulmonary hypertension.  Interestingly, she says that she gets lightheaded if she does not wear oxygen (oxygen likely lowers PA pressure).   - Continue to wear home oxygen.  - Unable to complete 6 minute walk due to back pain.  - Restart Opsumit 10 mg daily.  - Given visual changes, I will not have her restart tadalafil.  I am not sure tadalafil actually caused these changes, but her ophthalmologist is still evaluating her.  - I will work on getting her started on selexipag.  - Wear compression stockings.  - check anti-centromere antibody as  this was not done in the past.  2. Chronic hypoxemic respiratory failure: ?Primarily due to pulmonary hypertension.  3. Chronic diastolic CHF: With prominent RV failure. She is volume overloaded on exam.   - Increase Lasix to 60 mg bid with KCl 20 daily.   BMET today and again in 10 days.  - Continue spironolactone to 12.5 mg daily.  4. CKD: Stage 3.  Follow BMET as above.  5. CAD: Nonobstructive in 2015.  No exertional chest pain.  LDL very high in 4/19 but was not on statin. I do not think that the statin caused her unilateral hip pain.  - She is not taking rosuvastatin because it was too expensive.  I will have her instead start on atorvastatin 40 mg daily with lipids/LFTs in 2 months.    Followup in 6 wks.   [name] 04/21/2018

## 2018-04-22 ENCOUNTER — Encounter (INDEPENDENT_AMBULATORY_CARE_PROVIDER_SITE_OTHER): Payer: Self-pay | Admitting: Orthopedic Surgery

## 2018-04-22 ENCOUNTER — Ambulatory Visit (INDEPENDENT_AMBULATORY_CARE_PROVIDER_SITE_OTHER): Payer: Self-pay

## 2018-04-22 ENCOUNTER — Ambulatory Visit (INDEPENDENT_AMBULATORY_CARE_PROVIDER_SITE_OTHER): Payer: Medicare Other | Admitting: Orthopedic Surgery

## 2018-04-22 VITALS — Ht 61.0 in | Wt 171.2 lb

## 2018-04-22 DIAGNOSIS — M79671 Pain in right foot: Secondary | ICD-10-CM

## 2018-04-22 NOTE — Progress Notes (Addendum)
Office Visit Note   Patient: LESSLY Shepard           Date of Birth: Jun 13, 1946           MRN: [id number] Visit Date: 04/22/2018              Requested by: Maurice Small, MD [address] [place], [place] [address] PCP: Maurice Small, MD  Chief Complaint  Patient presents with  . Left Foot - Pain    Fell 4 days ago and hurt left ankle/foot  . Right Foot - Pain      HPI: Patient  Assessment & Plan: Visit Diagnoses:  1. 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  Recommended a stiff soled walking shoe reevaluated follow-up after a stiff soled walking shoe.  Follow-Up Instructions: Return in about 3 weeks (around 05/13/2018).   Ortho Exam  Patient is alert, oriented, no adenopathy, well-dressed, normal affect, normal respiratory effort. Patient has good pulses in both feet.  She does have an antalgic gait she is tender to palpation across the midfoot on the right.  The calcaneus is nontender to palpation.  There is no redness no cellulitis no signs of infection.  Imaging: No results found. No images are attached to the encounter.  Labs: Lab Results  Component Value Date   HGBA1C 7.2 (H) 04/11/2018   HGBA1C 14.4 (H) 05/02/2017   HGBA1C 13.6 (H) 03/11/2017   ESRSEDRATE 83 (H) 04/13/2018   ESRSEDRATE 37 (H) 08/13/2016   CRP 0.3 (L) 09/11/2016   REPTSTATUS 03/14/2018 FINAL 03/11/2018   CULT >=100,000 COLONIES/mL ESCHERICHIA COLI (A) 03/11/2018   LABORGA ESCHERICHIA COLI (A) 03/11/2018     Lab Results  Component Value Date   ALBUMIN 3.8 04/13/2018   ALBUMIN 3.3 (L) 04/09/2018   ALBUMIN 3.3 (L) 01/29/2018    Body mass index is 32.35 kg/m.  Orders:  Orders Placed This Encounter  Procedures  . XR Foot 2 Views Right   No orders of the defined types were placed in this encounter.    Procedures: No procedures performed  Clinical Data: No additional findings.  ROS:  All other systems  negative, except as noted in the HPI. Review of Systems  Objective: Vital Signs: Ht _0  (1.549 m)   Wt 171 lb 3.2 oz (77.7 kg)   BMI 32.35 kg/m   Specialty Comments:  No specialty comments available.  PMFS History: Patient Active Problem List   Diagnosis Date Noted  . Pain in joint, ankle and foot 04/10/2018  . Closed left fibular fracture 04/10/2018  . CKD (chronic kidney disease), stage IV ([place]) 04/09/2018  . Near syncope 04/09/2018  . Dyspnea   . Bronchitis, acute 03/11/2018  . Fatigue 03/11/2018  . Anemia due to stage 4 chronic kidney disease ([place]) 03/11/2018  . Pulmonary fibrosis ([place]) 03/11/2018  . Pulmonary HTN ([place]) 03/11/2018  . Anemia 03/11/2018  . Lisfranc dislocation, left, subsequent encounter 06/08/2017  . Chronic respiratory failure with hypoxia ([place]) 06/03/2017  . Falls 06/03/2017  . Hypokalemia 06/03/2017  . Sepsis secondary to UTI ([place]) 06/02/2017  . Dog bite 05/02/2017  . CHF exacerbation ([place]) 05/02/2017  . Acute diastolic CHF (congestive heart failure) ([place]) 05/02/2017  . Chronic diastolic CHF (congestive heart failure) ([place]) 04/02/2017  . Cor pulmonale ([place]) 04/02/2017  . Acute on chronic respiratory failure ([place]) 03/10/2017  . Chronic pain 03/10/2017  . Lactic acidosis 02/10/2017  . Acute respiratory  failure with hypoxia ([place]) 02/09/2017  . Acute on chronic diastolic CHF (congestive heart failure) ([place]) 06/23/2016  . HTN (hypertension) 06/23/2016  . Type 2 diabetes mellitus with complication, with long-term current use of insulin ([place]) 03/11/2016  . Depression 03/11/2016  . Chronic obstructive pulmonary disease ([place]) 03/10/2016  . S/P lumbar spinal fusion 02/22/2016  . Coronary artery disease involving native coronary artery 07/12/2014  . Pain in the chest   . Malignant neoplasm of lower lobe of right lung ([place]) 05/25/2014  . Lung cancer ([place]) 09/01/2012   Past Medical History:  Diagnosis Date  . Arthritis   . CAD (coronary artery  disease)    a. Prior cath 2015 showed 40% prox AD, 50-50% mLAD, otherwise calcification but no obstruction in LCx/RCA.. Medical management recommended. b. 2017: low-risk NST.  [name] Chronic diastolic CHF (congestive heart failure) ([place])   . Chronic respiratory failure ([place])   . CKD (chronic kidney disease), stage III ([place])   . COPD (chronic obstructive pulmonary disease) ([place])   . Cor pulmonale ([name])    a. felt due to advanced COPD and noncompliance with O2.  . Depression   . Diabetes mellitus    [name]    dx  2008  . Fracture    left foot  . Hx of seasonal allergies   . Hypercholesteremia   . Hypertension    on no medications  . Lung cancer ([place])   . On home oxygen therapy    "2.5L; 24/7" (03/10/2017)  . Pericardial effusion    a. small-moderate in 07/2016.  . Pulmonary hypertension ([place])   . UTI (urinary tract infection)     Family History  Problem Relation Age of Onset  . Diabetes Father   . Heart failure Father   . Cancer Sister   . Diabetes Brother   . Heart failure Brother     Past Surgical History:  Procedure Laterality Date  . [facility] or [facility] in [place]     bilateral cataracts  . LEFT HEART CATHETERIZATION WITH CORONARY ANGIOGRAM N/A 07/12/2014   Procedure: LEFT HEART CATHETERIZATION WITH CORONARY ANGIOGRAM;  Surgeon: [name], MD;  Location: [facility] CATH LAB;  Service: Cardiovascular;  Laterality: N/A;  . MAXIMUM ACCESS (MAS)POSTERIOR LUMBAR INTERBODY FUSION (PLIF) 2 LEVEL N/A 02/22/2016   Procedure: Lumbar three-four - Lumbar four-five  MAXIMUM ACCESS SURGERY  POSTERIOR LUMBAR INTERBODY FUSION;  Surgeon: [name], MD;  Location: [facility] NEURO ORS;  Service: Neurosurgery;  Laterality: N/A;  . ORIF TOE FRACTURE Left 06/12/2017   Procedure: OPEN REDUCTION INTERNAL FIXATION (ORIF) BASE 1ST METATARSAL (TOE) FRACTURE;  Surgeon: Newt Minion, MD;  Location: [facility];  Service: Orthopedics;  Laterality: Left;  . RIGHT  HEART CATH N/A 08/06/2017   Procedure: RIGHT HEART CATH;  Surgeon: Larey Dresser, MD;  Location: [facility] CV LAB;  Service: Cardiovascular;  Laterality: N/A;  . THORACOTOMY Right 2010   lower  . TONSILLECTOMY     Social History   Occupational History  . Not on file  Tobacco Use  . Smoking status: Former Smoker    Packs/day: 1.00    Years: 30.00    Pack years: 30.00    Last attempt to quit: 07/14/2006    Years since quitting: 11.8  . Smokeless tobacco: Never Used  Substance and Sexual Activity  . Alcohol use: No  . Drug use: No  . Sexual activity: Not on  file

## 2018-04-23 ENCOUNTER — Telehealth (HOSPITAL_COMMUNITY): Payer: Self-pay | Admitting: Pharmacist

## 2018-04-23 NOTE — Telephone Encounter (Signed)
Uptravi 200/800 mg PA approved by [employer] through 07/14/19.   Ruta Hinds. Velva Harman, PharmD, BCPS, CPP Clinical Pharmacist Phone: [phone] 04/23/2018 1:22 PM

## 2018-04-30 ENCOUNTER — Other Ambulatory Visit (HOSPITAL_COMMUNITY): Payer: Medicare Other

## 2018-05-06 ENCOUNTER — Ambulatory Visit (HOSPITAL_COMMUNITY)
Admission: RE | Admit: 2018-05-06 | Discharge: 2018-05-06 | Disposition: A | Discharge status: Home / Self Care | Payer: Medicare Other | Source: Ambulatory Visit | Attending: Internal Medicine | Admitting: Internal Medicine

## 2018-05-06 DIAGNOSIS — I5032 Chronic diastolic (congestive) heart failure: Secondary | ICD-10-CM

## 2018-05-06 DIAGNOSIS — I272 Pulmonary hypertension, unspecified: Secondary | ICD-10-CM | POA: Diagnosis present

## 2018-05-06 LAB — BASIC METABOLIC PANEL
Anion gap: 9 (ref 5–15)
BUN: 46 mg/dL — AB (ref 8–23)
CHLORIDE: 100 mmol/L (ref 98–111)
CO2: 29 mmol/L (ref 22–32)
CREATININE: 1.57 mg/dL — AB (ref 0.44–1.00)
Calcium: 9.2 mg/dL (ref 8.9–10.3)
GFR calc Af Amer: 37 mL/min — ABNORMAL LOW (ref >60)
GFR calc non Af Amer: 32 mL/min — ABNORMAL LOW (ref >60)
Glucose, Bld: 113 mg/dL — ABNORMAL HIGH (ref 70–99)
POTASSIUM: 4.2 mmol/L (ref 3.5–5.1)
SODIUM: 138 mmol/L (ref 135–145)

## 2018-05-07 LAB — CENTROMERE ANTIBODIES

## 2018-05-07 LAB — ANA W/REFLEX: Anti Nuclear Antibody(ANA): NEGATIVE

## 2018-05-13 ENCOUNTER — Ambulatory Visit (INDEPENDENT_AMBULATORY_CARE_PROVIDER_SITE_OTHER): Payer: Medicare Other | Admitting: Orthopedic Surgery

## 2018-05-19 ENCOUNTER — Ambulatory Visit: Payer: Medicare Other | Admitting: Physical Therapy

## 2018-05-24 ENCOUNTER — Encounter (INDEPENDENT_AMBULATORY_CARE_PROVIDER_SITE_OTHER): Payer: Self-pay | Admitting: Orthopedic Surgery

## 2018-05-24 ENCOUNTER — Ambulatory Visit (INDEPENDENT_AMBULATORY_CARE_PROVIDER_SITE_OTHER): Payer: Medicare Other | Admitting: Orthopedic Surgery

## 2018-05-24 DIAGNOSIS — M79672 Pain in left foot: Secondary | ICD-10-CM | POA: Diagnosis not present

## 2018-05-24 DIAGNOSIS — M79671 Pain in right foot: Secondary | ICD-10-CM | POA: Diagnosis not present

## 2018-05-24 NOTE — Progress Notes (Signed)
Office Visit Note   Patient: Amy Shepard           Date of Birth: February 28, 1946           MRN: [id number] Visit Date: 05/24/2018              Requested by: Maurice Small, MD [address] [place], [place] [address] PCP: Maurice Small, MD  No chief complaint on file.     HPI: Patient is a 72 year old woman who presents in follow-up for both feet.  Previously she had pain across the right midfoot.  She states that that pain has resolved she states that recently she has had some pain in her left heel which she feels like that is getting better as well.  Assessment & Plan: Visit Diagnoses:  1. Pain in left foot   2. Right foot pain     Plan: Patient is asymptomatic in both feet at this time.  She will use her regular shoewear.  Follow-Up Instructions: Return if symptoms worsen or fail to improve.   Ortho Exam  Patient is alert, oriented, no adenopathy, well-dressed, normal affect, normal respiratory effort. Examination patient is on nasal cannula oxygen as well as a seated rolling walker.  She has good pulses in both feet good range of motion of the ankle and subtalar joints bilaterally.  She has no tenderness to palpation over the calcaneus bilaterally and no pain to palpation across the midfoot.  Her previous radiograph was reviewed of the right foot which showed some arthritic changes across the midfoot on the right.  Radiographs were not ordered of the left foot today she is asymptomatic on the left foot today as well.  Imaging: No results found. No images are attached to the encounter.  Labs: Lab Results  Component Value Date   HGBA1C 7.2 (H) 04/11/2018   HGBA1C 14.4 (H) 05/02/2017   HGBA1C 13.6 (H) 03/11/2017   ESRSEDRATE 83 (H) 04/13/2018   ESRSEDRATE 37 (H) 08/13/2016   CRP 0.3 (L) 09/11/2016   REPTSTATUS 03/14/2018 FINAL 03/11/2018   CULT >=100,000 COLONIES/mL ESCHERICHIA COLI (A) 03/11/2018   LABORGA ESCHERICHIA COLI (A) 03/11/2018      Lab Results  Component Value Date   ALBUMIN 3.8 04/13/2018   ALBUMIN 3.3 (L) 04/09/2018   ALBUMIN 3.3 (L) 01/29/2018    There is no height or weight on file to calculate BMI.  Orders:  No orders of the defined types were placed in this encounter.  No orders of the defined types were placed in this encounter.    Procedures: No procedures performed  Clinical Data: No additional findings.  ROS:  All other systems negative, except as noted in the HPI. Review of Systems  Objective: Vital Signs: There were no vitals taken for this visit.  Specialty Comments:  No specialty comments available.  PMFS History: Patient Active Problem List   Diagnosis Date Noted  . Pain in joint, ankle and foot 04/10/2018  . Closed left fibular fracture 04/10/2018  . CKD (chronic kidney disease), stage IV ([place]) 04/09/2018  . Near syncope 04/09/2018  . Dyspnea   . Bronchitis, acute 03/11/2018  . Fatigue 03/11/2018  . Anemia due to stage 4 chronic kidney disease ([place]) 03/11/2018  . Pulmonary fibrosis ([place]) 03/11/2018  . Pulmonary HTN ([place]) 03/11/2018  . Anemia 03/11/2018  . Lisfranc dislocation, left, subsequent encounter 06/08/2017  . Chronic respiratory failure with hypoxia ([place]) 06/03/2017  . Falls 06/03/2017  . Hypokalemia 06/03/2017  . Sepsis  secondary to UTI ([place]) 06/02/2017  . Dog bite 05/02/2017  . CHF exacerbation ([place]) 05/02/2017  . Acute diastolic CHF (congestive heart failure) ([place]) 05/02/2017  . Chronic diastolic CHF (congestive heart failure) ([place]) 04/02/2017  . Cor pulmonale ([place]) 04/02/2017  . Acute on chronic respiratory failure ([place]) 03/10/2017  . Chronic pain 03/10/2017  . Lactic acidosis 02/10/2017  . Acute respiratory failure with hypoxia ([place]) 02/09/2017  . Acute on chronic diastolic CHF (congestive heart failure) ([place]) 06/23/2016  . HTN (hypertension) 06/23/2016  . Type 2 diabetes mellitus with complication, with long-term current use of insulin ([place])  03/11/2016  . Depression 03/11/2016  . Chronic obstructive pulmonary disease ([place]) 03/10/2016  . S/P lumbar spinal fusion 02/22/2016  . Coronary artery disease involving native coronary artery 07/12/2014  . Pain in the chest   . Malignant neoplasm of lower lobe of right lung ([place]) 05/25/2014  . Lung cancer ([place]) 09/01/2012   Past Medical History:  Diagnosis Date  . Arthritis   . CAD (coronary artery disease)    a. Prior cath 2015 showed 40% prox AD, 50-50% mLAD, otherwise calcification but no obstruction in LCx/RCA.. Medical management recommended. b. 2017: low-risk NST.  [name] Chronic diastolic CHF (congestive heart failure) ([place])   . Chronic respiratory failure ([place])   . CKD (chronic kidney disease), stage III ([place])   . COPD (chronic obstructive pulmonary disease) ([place])   . Cor pulmonale ([name])    a. felt due to advanced COPD and noncompliance with O2.  . Depression   . Diabetes mellitus    [name]    dx  2008  . Fracture    left foot  . Hx of seasonal allergies   . Hypercholesteremia   . Hypertension    on no medications  . Lung cancer ([place])   . On home oxygen therapy    "2.5L; 24/7" (03/10/2017)  . Pericardial effusion    a. small-moderate in 07/2016.  . Pulmonary hypertension ([place])   . UTI (urinary tract infection)     Family History  Problem Relation Age of Onset  . Diabetes Father   . Heart failure Father   . Cancer Sister   . Diabetes Brother   . Heart failure Brother     Past Surgical History:  Procedure Laterality Date  . [place] or [facility] in [place]     bilateral cataracts  . LEFT HEART CATHETERIZATION WITH CORONARY ANGIOGRAM N/A 07/12/2014   Procedure: LEFT HEART CATHETERIZATION WITH CORONARY ANGIOGRAM;  Surgeon: [name], MD;  Location: [facility] CATH LAB;  Service: Cardiovascular;  Laterality: N/A;  . MAXIMUM ACCESS (MAS)POSTERIOR LUMBAR INTERBODY FUSION (PLIF) 2 LEVEL N/A 02/22/2016   Procedure:  Lumbar three-four - Lumbar four-five  MAXIMUM ACCESS SURGERY  POSTERIOR LUMBAR INTERBODY FUSION;  Surgeon: [name], MD;  Location: [facility] NEURO ORS;  Service: Neurosurgery;  Laterality: N/A;  . ORIF TOE FRACTURE Left 06/12/2017   Procedure: OPEN REDUCTION INTERNAL FIXATION (ORIF) BASE 1ST METATARSAL (TOE) FRACTURE;  Surgeon: Newt Minion, MD;  Location: [facility];  Service: Orthopedics;  Laterality: Left;  . RIGHT HEART CATH N/A 08/06/2017   Procedure: RIGHT HEART CATH;  Surgeon: Larey Dresser, MD;  Location: [facility] CV LAB;  Service: Cardiovascular;  Laterality: N/A;  . THORACOTOMY Right 2010   lower  . TONSILLECTOMY     Social History   Occupational History  . Not on file  Tobacco  Use  . Smoking status: Former Smoker    Packs/day: 1.00    Years: 30.00    Pack years: 30.00    Last attempt to quit: 07/14/2006    Years since quitting: 11.8  . Smokeless tobacco: Never Used  Substance and Sexual Activity  . Alcohol use: No  . Drug use: No  . Sexual activity: Not on file

## 2018-05-31 ENCOUNTER — Ambulatory Visit: Payer: Medicare Other | Attending: Family Medicine | Admitting: Physical Therapy

## 2018-05-31 ENCOUNTER — Encounter: Payer: Self-pay | Admitting: Physical Therapy

## 2018-05-31 ENCOUNTER — Other Ambulatory Visit: Payer: Self-pay

## 2018-05-31 DIAGNOSIS — M6281 Muscle weakness (generalized): Secondary | ICD-10-CM | POA: Diagnosis present

## 2018-05-31 DIAGNOSIS — R262 Difficulty in walking, not elsewhere classified: Secondary | ICD-10-CM

## 2018-05-31 DIAGNOSIS — R296 Repeated falls: Secondary | ICD-10-CM

## 2018-05-31 NOTE — Patient Instructions (Signed)
Access Code: [email or web address]  URL: [email or web address]  Date: 05/31/2018  Prepared by: Venetia Night Laurine Kuyper   Exercises  Seated March - 30 reps - 2 sets - 1x daily - 7x weekly  Seated Long Arc Quad - 15 reps - 2 sets - 1x daily - 7x weekly  Seated Gluteal Sets - 10 reps - 2 sets - 3 hold - 1x daily - 7x weekly

## 2018-05-31 NOTE — Therapy (Signed)
[facility] [address] [place], [place], [address] Phone: [phone]   Fax:  [phone]  Physical Therapy Evaluation  Patient Details  Name: Amy Shepard MRN: [id number] Date of Birth: 05-19-1946 Referring Provider (PT): Maurice Small, MD   Encounter Date: 05/31/2018  PT End of Session - 05/31/18 1345    Visit Number  1    Date for PT Re-Evaluation  07/26/18    Authorization Type  [employer] Medicare    Authorization Time Period  05/31/18 - 07/26/18    PT Start Time  1100    PT Stop Time  1140    PT Time Calculation (min)  40 min    Equipment Utilized During Treatment  --   Pt's rollator   Activity Tolerance  Patient tolerated treatment well    Behavior During Therapy  [facility] for tasks assessed/performed       Past Medical History:  Diagnosis Date  . Arthritis   . CAD (coronary artery disease)    a. Prior cath 2015 showed 40% prox AD, 50-50% mLAD, otherwise calcification but no obstruction in LCx/RCA.. Medical management recommended. b. 2017: low-risk NST.  [name] Chronic diastolic CHF (congestive heart failure) ([place])   . Chronic respiratory failure ([place])   . CKD (chronic kidney disease), stage III ([place])   . COPD (chronic obstructive pulmonary disease) ([place])   . Cor pulmonale ([name])    a. felt due to advanced COPD and noncompliance with O2.  . Depression   . Diabetes mellitus    [name]    dx  2008  . Fracture    left foot  . Hx of seasonal allergies   . Hypercholesteremia   . Hypertension    on no medications  . Lung cancer ([place])   . On home oxygen therapy    "2.5L; 24/7" (03/10/2017)  . Pericardial effusion    a. small-moderate in 07/2016.  . Pulmonary hypertension ([place])   . UTI (urinary tract infection)     Past Surgical History:  Procedure Laterality Date  . [place] [facility] in [place]     bilateral cataracts  . LEFT HEART CATHETERIZATION  WITH CORONARY ANGIOGRAM N/A 07/12/2014   Procedure: LEFT HEART CATHETERIZATION WITH CORONARY ANGIOGRAM;  Surgeon: [name], MD;  Location: [facility] CATH LAB;  Service: Cardiovascular;  Laterality: N/A;  . MAXIMUM ACCESS (MAS)POSTERIOR LUMBAR INTERBODY FUSION (PLIF) 2 LEVEL N/A 02/22/2016   Procedure: Lumbar three-four - Lumbar four-five  MAXIMUM ACCESS SURGERY  POSTERIOR LUMBAR INTERBODY FUSION;  Surgeon: [name], MD;  Location: [facility] NEURO ORS;  Service: Neurosurgery;  Laterality: N/A;  . ORIF TOE FRACTURE Left 06/12/2017   Procedure: OPEN REDUCTION INTERNAL FIXATION (ORIF) BASE 1ST METATARSAL (TOE) FRACTURE;  Surgeon: Newt Minion, MD;  Location: [facility];  Service: Orthopedics;  Laterality: Left;  . RIGHT HEART CATH N/A 08/06/2017   Procedure: RIGHT HEART CATH;  Surgeon: Larey Dresser, MD;  Location: [facility] CV LAB;  Service: Cardiovascular;  Laterality: N/A;  . THORACOTOMY Right 2010   lower  . TONSILLECTOMY      There were no vitals filed for this visit.   Subjective Assessment - 05/31/18 1106    Subjective  Pt is referred to PT for frequent falls.  Pt states she feels insecure with her rollator - it feels unstable and gets away from her and when she tries to catch  up to it she falls.  Pt has fallen 5 times in the last 6 months all of which were related to her rollator getting away from her.  She bought the rollator at [employer] approx 1 year ago and uses it full time.  It carries her oxgygen tank.  She states the right rollator brake is broken.  She would like to work on strength and balance in PT.    How long can you stand comfortably?  3-5 min, limited by Lt hip pain    How long can you walk comfortably?  short household distances, limited by shortness of breath    Patient Stated Goals  improve strength and balance, increase walking tolerance    Currently in Pain?  No/denies   has bil hip pain, LBP, Rt thigh pain in her current history, took Aleve this AM which helps         [facility] PT Assessment - 05/31/18 0001      Assessment   Medical Diagnosis  R29.6 (ICD-10-CM) - Repeated falls    Referring Provider (PT)  Maurice Small, MD    Onset Date/Surgical Date  --   2017   Hand Dominance  Right    Next MD Visit  no    Prior Therapy  yes      Precautions   Precaution Comments  Pt uses full time oxygen      Balance Screen   Has the patient fallen in the past 6 months  Yes    How many times?  5    Has the patient had a decrease in activity level because of a fear of falling?   Yes    Is the patient reluctant to leave their home because of a fear of falling?   Yes      [place]  Private residence    Living Arrangements  Alone    Type of [place] Access  Level entry    [place] - 4 wheels;Grab bars - tub/shower;Grab bars - toilet   rollator     Prior Function   Level of Independence  Independent with household mobility with device;Independent with community mobility with device   uses rollator full time, with oxygen   Vocation  Retired    Leisure  Pt says she sleeps all the time      Cognition   Overall Cognitive Status  Within Functional Limits for tasks assessed      ROM / Strength   AROM / PROM / Strength  Strength      Strength   Strength Assessment Site  Hip;Knee;Ankle    Right/Left Hip  Right;Left    Right Hip Flexion  4-/5    Right Hip Extension  4-/5    Right Hip External Rotation   3+/5    Right Hip Internal Rotation  4-/5    Right Hip ABduction  4-/5    Right Hip ADduction  4-/5    Left Hip Flexion  4-/5    Left Hip Extension  4-/5    Left Hip External Rotation  3+/5    Left Hip Internal Rotation  4-/5    Left Hip ABduction  3+/5    Left Hip ADduction  4-/5    Right/Left Knee  Right;Left    Right Knee Flexion  4-/5    Right Knee Extension  4/5    Left  Knee Flexion  4-/5    Left Knee Extension  4/5    Right/Left Ankle  Right;Left    Right  Ankle Dorsiflexion  4-/5    Right Ankle Plantar Flexion  4-/5    Left Ankle Dorsiflexion  4-/5    Left Ankle Plantar Flexion  4-/5      Transfers   Five time sit to stand comments   15 sec with use of bil UEs      Balance   Balance Assessed  Yes      Standardized Balance Assessment   Standardized Balance Assessment  Timed Up and Go Test      Timed Up and Go Test   TUG  Normal TUG    Normal TUG (seconds)  21    TUG Comments  use of rollator walker      High Level Balance   High Level Balance Activites  --   tandem stance bil: loss of balance within 5 sec Lt, Rt               Objective measurements completed on examination: See above findings.              PT Education - 05/31/18 1138    Education Details  Access Code: [email or web address]    Person(s) Educated  Patient    Methods  Explanation;Handout    Comprehension  Verbalized understanding;Returned demonstration       PT Short Term Goals - 05/31/18 2008      PT SHORT TERM GOAL #1   Title  Pt will be ind in initial HEP    Time  4    Period  Weeks    Status  New    Target Date  06/28/18      PT SHORT TERM GOAL #2   Title  Pt will be able to perform at least 10 min of active household tasks before needing a seated break.    Time  4    Period  Weeks    Status  New    Target Date  06/28/18      PT SHORT TERM GOAL #3   Title  Pt will reduce TUG time to < or = 18 seconds to demonstrate reduced fall risk.    Time  5    Period  Weeks    Status  New    Target Date  07/05/18      PT SHORT TERM GOAL #4   Title  Pt will receive at least a 4/5 for all LE muscle groups bil.    Time  5    Period  Weeks    Status  New    Target Date  07/05/18        PT Long Term Goals - 05/31/18 2014      PT LONG TERM GOAL #1   Title  Pt will be ind in advanced HEP.    Time  8    Period  Weeks    Status  New    Target Date  07/26/18      PT LONG TERM GOAL #2   Title  Pt will perform 5x sit to stand in < or = 13  seconds to demonstrate reduced fall risk.    Time  8    Period  Weeks    Status  New    Target Date  07/26/18      PT LONG TERM GOAL #3   Title  Pt will perform  level surface ambulation with rollator > or = 200' with supervision and no balance losses to improve safety in household and short distance community ambulation.    Time  8    Period  Weeks    Status  New    Target Date  07/26/18      PT LONG TERM GOAL #4   Title  Pt will receive at least a 4+/5 for all LE muscle groups.    Time  8    Period  Weeks    Status  New    Target Date  07/26/18      PT LONG TERM GOAL #5   Title  Pt will report increased tolerance of active household tasks to at least 15 min at a time to increase independence and functional endurance throughout the day.    Time  8    Period  Weeks    Status  New    Target Date  07/26/18             Plan - 05/31/18 1347    Clinical Impression Statement  Pt presents to the clinic with a history of repeated falls.  She uses portable oxygen and rollator walker full time and has an extensive medical history.  She has fallen five times in six months and reports all falls are related to rollator getting too far ahead of her.  She is limited in standing tasks due to hip pain and is limited in walking due to poor cardiopulmonary health/endurance.  She reports she only walks short household distances before being out of breath.  She would like to work on strength and balance and has done previous therapy for this before which she says has helped.  Her 5x sit to stand was 15 sec and TUG was 21 sec, both performed with bil UE assist for sit to stand aspects and rollator walker with supervision.  She will benefit from skilled PT to address balance, strength, gait training with rollator and functional training/endurance for increased tolerance and safety in household and community independence.    History and Personal Factors relevant to plan of care:  extensive medical  history, use of O2 full time, rollator full time, frequent falls, poor pulmonary health/endurance    Clinical Presentation  Stable    Clinical Presentation due to:  frequent falls    Clinical Decision Making  Low    Rehab Potential  Good    Clinical Impairments Affecting Rehab Potential  extensive medical history, use of O2 full time, rollator full time, frequent falls, poor pulmonary health/endurance    PT Frequency  2x / week   Pt will taper to 1x/week after 3-4 weeks due to financial concerns with co-pay   PT Duration  8 weeks    PT Treatment/Interventions  ADLs/Self Care Home Management;Moist Heat;DME Instruction;Gait training;Stair training;Functional mobility training;Therapeutic activities;Therapeutic exercise;Balance training;Neuromuscular re-education;Patient/family education;Energy conservation    PT Next Visit Plan  balance, LE ther ex seated, standing as tolerated, cardio, progress HEP    PT Home Exercise Plan  Access Code: [email or web address]    Consulted and Agree with Plan of Care  Patient       Patient will benefit from skilled therapeutic intervention in order to improve the following deficits and impairments:  Abnormal gait, Cardiopulmonary status limiting activity, Decreased mobility, Postural dysfunction, Decreased activity tolerance, Decreased endurance, Decreased strength, Decreased balance, Difficulty walking  Visit Diagnosis: Repeated falls - Plan: PT plan of care cert/re-cert  Muscle weakness (generalized) - Plan:  PT plan of care cert/re-cert  Difficulty in walking, not elsewhere classified - Plan: PT plan of care cert/re-cert     Problem List Patient Active Problem List   Diagnosis Date Noted  . Pain in joint, ankle and foot 04/10/2018  . Closed left fibular fracture 04/10/2018  . CKD (chronic kidney disease), stage IV ([place]) 04/09/2018  . Near syncope 04/09/2018  . Dyspnea   . Bronchitis, acute 03/11/2018  . Fatigue 03/11/2018  . Anemia due to stage 4 chronic  kidney disease ([place]) 03/11/2018  . Pulmonary fibrosis ([place]) 03/11/2018  . Pulmonary HTN ([place]) 03/11/2018  . Anemia 03/11/2018  . Lisfranc dislocation, left, subsequent encounter 06/08/2017  . Chronic respiratory failure with hypoxia ([place]) 06/03/2017  . Falls 06/03/2017  . Hypokalemia 06/03/2017  . Sepsis secondary to UTI ([place]) 06/02/2017  . Dog bite 05/02/2017  . CHF exacerbation ([place]) 05/02/2017  . Acute diastolic CHF (congestive heart failure) ([place]) 05/02/2017  . Chronic diastolic CHF (congestive heart failure) ([place]) 04/02/2017  . Cor pulmonale ([place]) 04/02/2017  . Acute on chronic respiratory failure ([place]) 03/10/2017  . Chronic pain 03/10/2017  . Lactic acidosis 02/10/2017  . Acute respiratory failure with hypoxia ([place]) 02/09/2017  . Acute on chronic diastolic CHF (congestive heart failure) ([place]) 06/23/2016  . HTN (hypertension) 06/23/2016  . Type 2 diabetes mellitus with complication, with long-term current use of insulin ([place]) 03/11/2016  . Depression 03/11/2016  . Chronic obstructive pulmonary disease ([place]) 03/10/2016  . S/P lumbar spinal fusion 02/22/2016  . Coronary artery disease involving native coronary artery 07/12/2014  . Pain in the chest   . Malignant neoplasm of lower lobe of right lung ([place]) 05/25/2014  . Lung cancer ([employer]) 09/01/2012    Rodd Heft, PT 05/31/18 8:22 PM   [facility] [address] [place], [place], [address] Phone: [phone]   Fax:  [phone]  Name: Amy Shepard MRN: [id number] Date of Birth: 1945/10/27

## 2018-06-09 ENCOUNTER — Ambulatory Visit: Payer: Medicare Other | Admitting: Physical Therapy

## 2018-06-16 ENCOUNTER — Encounter: Payer: Medicare Other | Admitting: Physical Therapy

## 2018-06-21 ENCOUNTER — Encounter: Payer: Medicare Other | Admitting: Physical Therapy

## 2018-06-23 ENCOUNTER — Encounter: Payer: Medicare Other | Admitting: Physical Therapy

## 2018-06-28 ENCOUNTER — Encounter: Payer: Medicare Other | Admitting: Physical Therapy

## 2018-06-29 ENCOUNTER — Ambulatory Visit (HOSPITAL_COMMUNITY)
Admission: RE | Admit: 2018-06-29 | Discharge: 2018-06-29 | Disposition: A | Discharge status: Home / Self Care | Payer: Medicare Other | Source: Ambulatory Visit | Attending: Cardiology | Admitting: Cardiology

## 2018-06-29 ENCOUNTER — Encounter (HOSPITAL_COMMUNITY): Payer: Self-pay | Admitting: Cardiology

## 2018-06-29 VITALS — BP 90/42 | HR 62 | Wt 173.2 lb

## 2018-06-29 DIAGNOSIS — Z794 Long term (current) use of insulin: Secondary | ICD-10-CM | POA: Insufficient documentation

## 2018-06-29 DIAGNOSIS — Z79899 Other long term (current) drug therapy: Secondary | ICD-10-CM | POA: Insufficient documentation

## 2018-06-29 DIAGNOSIS — Z9221 Personal history of antineoplastic chemotherapy: Secondary | ICD-10-CM | POA: Insufficient documentation

## 2018-06-29 DIAGNOSIS — N183 Chronic kidney disease, stage 3 unspecified: Secondary | ICD-10-CM

## 2018-06-29 DIAGNOSIS — Z79891 Long term (current) use of opiate analgesic: Secondary | ICD-10-CM | POA: Diagnosis not present

## 2018-06-29 DIAGNOSIS — I130 Hypertensive heart and chronic kidney disease with heart failure and stage 1 through stage 4 chronic kidney disease, or unspecified chronic kidney disease: Secondary | ICD-10-CM | POA: Diagnosis not present

## 2018-06-29 DIAGNOSIS — E785 Hyperlipidemia, unspecified: Secondary | ICD-10-CM | POA: Insufficient documentation

## 2018-06-29 DIAGNOSIS — I2781 Cor pulmonale (chronic): Secondary | ICD-10-CM

## 2018-06-29 DIAGNOSIS — I951 Orthostatic hypotension: Secondary | ICD-10-CM | POA: Insufficient documentation

## 2018-06-29 DIAGNOSIS — I2721 Secondary pulmonary arterial hypertension: Secondary | ICD-10-CM | POA: Diagnosis not present

## 2018-06-29 DIAGNOSIS — J449 Chronic obstructive pulmonary disease, unspecified: Secondary | ICD-10-CM | POA: Insufficient documentation

## 2018-06-29 DIAGNOSIS — Z85118 Personal history of other malignant neoplasm of bronchus and lung: Secondary | ICD-10-CM | POA: Insufficient documentation

## 2018-06-29 DIAGNOSIS — Z833 Family history of diabetes mellitus: Secondary | ICD-10-CM | POA: Diagnosis not present

## 2018-06-29 DIAGNOSIS — E1122 Type 2 diabetes mellitus with diabetic chronic kidney disease: Secondary | ICD-10-CM | POA: Diagnosis not present

## 2018-06-29 DIAGNOSIS — I5032 Chronic diastolic (congestive) heart failure: Secondary | ICD-10-CM | POA: Diagnosis not present

## 2018-06-29 DIAGNOSIS — I2510 Atherosclerotic heart disease of native coronary artery without angina pectoris: Secondary | ICD-10-CM | POA: Diagnosis not present

## 2018-06-29 DIAGNOSIS — J9611 Chronic respiratory failure with hypoxia: Secondary | ICD-10-CM

## 2018-06-29 DIAGNOSIS — Z7982 Long term (current) use of aspirin: Secondary | ICD-10-CM | POA: Insufficient documentation

## 2018-06-29 DIAGNOSIS — Z8249 Family history of ischemic heart disease and other diseases of the circulatory system: Secondary | ICD-10-CM | POA: Insufficient documentation

## 2018-06-29 DIAGNOSIS — Z9981 Dependence on supplemental oxygen: Secondary | ICD-10-CM | POA: Diagnosis not present

## 2018-06-29 DIAGNOSIS — I272 Pulmonary hypertension, unspecified: Secondary | ICD-10-CM | POA: Diagnosis not present

## 2018-06-29 MED ORDER — FUROSEMIDE 40 MG PO TABS: 40.0000 mg | ORAL_TABLET | Freq: Two times a day (BID) | ORAL | 3 refills | Status: DC

## 2018-06-29 MED ORDER — POTASSIUM CHLORIDE CRYS ER 20 MEQ PO TBCR: 40.0000 meq | EXTENDED_RELEASE_TABLET | Freq: Every day | ORAL | 3 refills | Status: DC

## 2018-06-29 NOTE — Progress Notes (Signed)
PCP: Dr. Justin Mend HF Cardiology: Dr. Aundra Dubin  72 y.o. with history of hypoxemic respiratory failure, chronic diastolic CHF, pulmonary hypertension, prior lung cancer, and CKD stage 3 returns for followup of pulmonary hypertension.  Patient wears home oxygen.  PFTs in 3/18 showed moderate-severe restriction but only minimal obstruction.  CTA chest in 1/18 showed relatively normal lung parenchyma and V/Q scan in 8/18 showed no chronic or acute PE.  Echo in 8/18 showed normal LV EF with dilated and dysfunctional RV.  She was admitted in 10/18 with acute/chronic diastolic CHF and was diuresed with IV Lasix.    We had planned a RHC in the fall of 2018 but she developed a Lisfranc fracture in her left foot requiring internal fixation and also had an admission with urosepsis.  RHC was postponed. Echo in 11/18 showed EF 55%, RV moderately dilated, PASP 51 mmHg. RHC was later done in 1/19, showing moderate PAH.   Patient was admitted in 9/19 with presyncope. She was orthostatic.  Opsumit, lisinopril, and spironolactone were stopped. She developed visual changes/blurry vision and stopped tadalafil.  Stopping tadalafil did not help.  She is seeing an ophthalmologist for the visual changes, MRI head was unremarkable.  Echo in 9/19 showed EF 55-60% with septal flattening, mildly dilated RV with moderately decreased systolic function, PA systolic pressure 81 mmHg.   She has been started on selexipag, the dose is now up to 1200 mcg bid.  She did not feel much different after starting Opsumit, but she does think that selexipag has helped.  She is able to walk farther but still very limited.  She continues to use home oxygen. She is tired after walking about 200 feet, uses walker.  No chest pain, no orthopnea/PND.  She is orthostatic today (90/42 sitting => 70/40 standing). She has noted lightheadedness with standing for about 2 wks now but not severe.   No falls.  She is eating/drinking normally.   6 minute walk (4/19): 137  m  Labs (10/18): K 4, creatinine 1.46, hgb 14.5 Labs (1/19): K 3, creatinine 1.8 => 1.23 Labs (4/19): LDL 257, K 4, creatinine 1.67 Labs (10/19): ESR 83, K 3.6, creatinine 1.5 => 1.57, ANA negative, anti-centromere Ab negative.   ECG (personally reviewed): NSR, left axis deviation  PMH: 1. Chronic hypoxemic respiratory failure: On home oxygen.  2. COPD: This diagnosis has been listed, but PFTs in 3/18 did not show significant obstruction.  3. Chronic diastolic CHF: With cor pulmonale.  - Echo (8/18): EF 65-70%, severe RV dilation with decreased RV systolic function, PASP 67 mmHg.  - Echo (11/18): EF 55%, PASP 51 mmHg, RV moderately dilated.  - Echo (9/19): EF 55-60%, septal flattening, PASP 81 mmHg, RV mildly dilated with moderately decreased systolic function.  4. Type II diabetes 5. HTN 6. Hyperlipidemia 7. CKD: Stage 3.  8. CAD: LHC (2015) with 40% pLAD, 50% mLAD.  9. Lung cancer: Treated in 2010 with chemotherapy and resection.  No recurrence.  10. 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   - V/Q scan negative for chronic PE 8/18.  - CTA chest (1/18): No PE, no emphysema, no ILD. - PFTs (3/18): moderate-severe restriction with minimal obstruction, severely decreased DLCO. FVC 63%, FEV1 72%, ratio 114%.  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  - Sleep study (2/19): No OSA, there is nocturnal hypoxemia.  - RHC (1/19): mean RA 7, PA 55/16 mean 34, mean PCWP 15, CI 2.34.   SH: son and daughter in [place],  quit smoking in 2008.   Family History  Problem Relation Age of Onset  . Diabetes Father   . Heart failure Father   . Cancer Sister   . Diabetes Brother   . Heart failure Brother    ROS: All systems reviewed and negative except as per HPI.  Current Outpatient Medications  Medication Sig Dispense Refill  . acetaminophen (TYLENOL) 325 MG tablet Take 650 mg by mouth every 6 (six) hours as needed for moderate pain or fever.     [name] aspirin  EC 81 MG tablet Take 81 mg by mouth daily.     [name] atorvastatin (LIPITOR) 40 MG tablet Take 1 tablet (40 mg total) by mouth daily. 90 tablet 3  . Cholecalciferol (VITAMIN D) 2000 units CAPS Take 2,000 Units by mouth daily.    . Fluticasone-Salmeterol (ADVAIR) 250-50 MCG/DOSE AEPB Inhale 1 puff into the lungs 2 (two) times daily as needed (shortness of breath/wheezing).     Derrill Memo ON 06/30/2018] furosemide (LASIX) 40 MG tablet Take 1 tablet (40 mg total) by mouth 2 (two) times daily. 60 tablet 3  . gabapentin (NEURONTIN) 300 MG capsule Take 300 mg by mouth 3 (three) times daily.    . Insulin Detemir (LEVEMIR FLEXTOUCH) 100 UNIT/ML Pen Inject 36 Units into the skin at bedtime.     . insulin lispro (HUMALOG) 100 UNIT/ML injection Inject 2-11 Units into the skin See admin instructions. Inject 6 units with each meal and add additional units per sliding scale: BGL 101-150 = 2 units; 151-200 = 3 units; 201-250 = 5 units; 251-300 = 7 units; 301-350 = 9 units; >350 = 11 units; CALL MD IF BGL IS >400 OR <60    . macitentan (OPSUMIT) 10 MG tablet Take 1 tablet (10 mg total) by mouth daily. 30 tablet   . OXYGEN Inhale 3 L into the lungs continuous. FOR COPD    . potassium chloride SA (K-DUR,KLOR-CON) 20 MEQ tablet Take 2 tablets (40 mEq total) by mouth daily. 60 tablet 3  . Selexipag (UPTRAVI) 200 MCG TABS Take 200 mcg by mouth 2 (two) times daily.    . sertraline (ZOLOFT) 50 MG tablet Take 150 mg by mouth daily.     . TRULICITY 5.00 BB/0.4UG SOPN Inject 0.5 mLs as directed every Sunday.     [name] guaiFENesin (MUCINEX) 600 MG 12 hr tablet Take 1 tablet (600 mg total) by mouth 2 (two) times daily as needed for cough or to loosen phlegm. (Patient not taking: Reported on 06/29/2018) 20 tablet 0  . ipratropium-albuterol (DUONEB) 0.5-2.5 (3) MG/3ML SOLN Take 3 mLs by nebulization every 4 (four) hours as needed (wheezing, Shortness of breath). (Patient not taking: Reported on 06/29/2018) 360 mL 0  . traMADol (ULTRAM) 50  MG tablet Take 1 tablet (50 mg total) by mouth every 8 (eight) hours as needed (for pain). (Patient not taking: Reported on 06/29/2018) 10 tablet 0   No current facility-administered medications for this encounter.    Facility-Administered Medications Ordered in Other Encounters  Medication Dose Route Frequency Provider Last Rate Last Dose  . diatrizoate meglumine-sodium (GASTROGRAFIN) 66-10 % solution 30 mL  30 mL Oral PRN Bjorn Loser, MD   30 mL at 11/30/15 0820   BP (!) 90/42 (BP Location: Left Arm, Patient Position: Standing)   Pulse 62   Wt 78.6 kg (173 lb 3.2 oz)   SpO2 99% Comment: 4L  BMI 32.73 kg/m  General: NAD Neck: JVP 7 cm, no thyromegaly or thyroid nodule.  Lungs: Clear to auscultation bilaterally with normal respiratory effort. CV: Nondisplaced PMI.  Heart regular S1/S2, no S3/S4, no murmur.  Trace ankle edema.  No carotid bruit.  Normal pedal pulses.  Abdomen: Soft, nontender, no hepatosplenomegaly, no distention.  Skin: Intact without lesions or rashes.  Neurologic: Alert and oriented x 3.  Psych: Normal affect. Extremities: No clubbing or cyanosis.  HEENT: Normal.   Assessment/Plan: 1. 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  Serologies negative.  Restrictive PFTs but lung parenchyma looked relatively normal on 1/18 CTA chest.  Findings by PFTs and CT not consistent with emphysema. V/Q scan negative for chronic or acute PEs. Serologic workup was negative. RHC (1/19) showed moderate pulmonary arterial hypertension. Echo in 8/18 showed dilated and dysfunctional RV, similar in 11/18 and 9/19.  Echo in 1/99 showed PA systolic pressure still very high at 81 mmHg.  Sleep study showed nocturnal hypoxemia but not OSA.  She wears home oxygen.  She is currently on Opsumit and selexipag and thinks that selexipag has improved her exercise tolerance.  - Continue to wear home oxygen.  - Will not do 6 minute walk today due to orthostasis.  - Continue Opsumit.  - Given  visual changes, I will not have her restart tadalafil.   - Continue titrating up selexipag.  2. Chronic hypoxemic respiratory failure: ?Primarily due to pulmonary hypertension.  - I would like her to be seen by pulmonary.  I will arrange appointment with Dr. Lake Bells.  3. Chronic diastolic CHF: With prominent RV failure. She is not volume overloaded on exam.   - With orthostasis, I will decrease Lasix to 40 mg bid. 4. CKD: Stage 3.  Follow BMET as above.  5. CAD: Nonobstructive in 2015.  No exertional chest pain.  LDL very high in 4/19 but was not on statin. I do not think that the statin caused her unilateral hip pain.  - Continue atorvastatin 40 mg daily.  6. Orthostatic hypotension: RV failure may play a role.   - Hold Lasix today, then decrease Lasix to 40 bid.  - Wear compression stockings during the day.  - Stop spironolactone, increase KCl to 40 daily.     Followup in 1 week with NP/PA to reassess volume and blood pressure.   [name] 06/29/2018

## 2018-06-29 NOTE — Patient Instructions (Signed)
HOLD Lasix today  Starting tomorrow, DECREASE Lasix to 54m twice a day.  INCREASE Potassium to 431m daily  DISCONTINUE Spironolactone  You have been referred to Dr. McLake Bellspulmonology. They will contact to you for your appointment.   You were given a script for compression stockings and slippie gator set  Your physician recommends that you schedule a follow-up appointment in: 1 week with NP/PA

## 2018-06-30 ENCOUNTER — Ambulatory Visit: Payer: Medicare Other | Admitting: Physical Therapy

## 2018-06-30 ENCOUNTER — Telehealth (HOSPITAL_COMMUNITY): Payer: Self-pay

## 2018-06-30 NOTE — Telephone Encounter (Signed)
Prior authorization through [place] was initiated for Opsumit medication and sent via cmm on 06/30/2018.

## 2018-07-01 ENCOUNTER — Telehealth (HOSPITAL_COMMUNITY): Payer: Self-pay

## 2018-07-01 NOTE — Telephone Encounter (Signed)
Prior authorization through [place] was APPROVED for [place] and will expire on 07/14/2019.

## 2018-07-05 ENCOUNTER — Other Ambulatory Visit (HOSPITAL_COMMUNITY): Payer: Self-pay | Admitting: *Deleted

## 2018-07-05 ENCOUNTER — Encounter: Payer: Medicare Other | Admitting: Physical Therapy

## 2018-07-05 ENCOUNTER — Encounter (HOSPITAL_COMMUNITY): Payer: Medicare Other

## 2018-07-09 ENCOUNTER — Telehealth (HOSPITAL_COMMUNITY): Payer: Self-pay

## 2018-07-09 NOTE — Telephone Encounter (Signed)
If this has started since she went back on Opsumit OK to hold weekend. Will discuss with Dr. Aundra Dubin on Monday and follow up with any further.     Legrand Como [address] [place], PA-C 07/09/2018 3:15 PM

## 2018-07-09 NOTE — Telephone Encounter (Signed)
Pt called and stated that she is experiencing nausea, vomiting, headaches, and muscle pain from the opsumit 10 mg. Pt is currently taking (Take 1 tablet (10 mg total) by mouth daily). No other symptoms to report. Please advise.

## 2018-07-16 ENCOUNTER — Ambulatory Visit (HOSPITAL_COMMUNITY): Payer: Medicare Other

## 2018-07-20 ENCOUNTER — Other Ambulatory Visit (HOSPITAL_COMMUNITY): Payer: Self-pay | Admitting: Cardiology

## 2018-07-20 ENCOUNTER — Telehealth: Payer: Self-pay | Admitting: Medical Oncology

## 2018-07-21 ENCOUNTER — Ambulatory Visit (HOSPITAL_COMMUNITY)
Admission: RE | Admit: 2018-07-21 | Discharge: 2018-07-21 | Disposition: A | Discharge status: Home / Self Care | Payer: Medicare Other | Source: Ambulatory Visit | Attending: Internal Medicine | Admitting: Internal Medicine

## 2018-07-21 ENCOUNTER — Inpatient Hospital Stay: Payer: Medicare Other | Attending: Internal Medicine

## 2018-07-21 DIAGNOSIS — C3490 Malignant neoplasm of unspecified part of unspecified bronchus or lung: Secondary | ICD-10-CM | POA: Insufficient documentation

## 2018-07-21 DIAGNOSIS — Z9221 Personal history of antineoplastic chemotherapy: Secondary | ICD-10-CM | POA: Insufficient documentation

## 2018-07-21 DIAGNOSIS — Z85118 Personal history of other malignant neoplasm of bronchus and lung: Secondary | ICD-10-CM | POA: Diagnosis present

## 2018-07-21 LAB — CBC WITH DIFFERENTIAL (CANCER CENTER ONLY)
Abs Immature Granulocytes: 0.02 10*3/uL (ref 0.00–0.07)
BASOS ABS: 0 10*3/uL (ref 0.0–0.1)
Basophils Relative: 0 %
EOS ABS: 0.3 10*3/uL (ref 0.0–0.5)
EOS PCT: 5 %
HEMATOCRIT: 31.4 % — AB (ref 36.0–46.0)
HEMOGLOBIN: 9.5 g/dL — AB (ref 12.0–15.0)
Immature Granulocytes: 0 %
LYMPHS ABS: 0.9 10*3/uL (ref 0.7–4.0)
LYMPHS PCT: 12 %
MCH: 24.7 pg — AB (ref 26.0–34.0)
MCHC: 30.3 g/dL (ref 30.0–36.0)
MCV: 81.8 fL (ref 80.0–100.0)
Monocytes Absolute: 0.6 10*3/uL (ref 0.1–1.0)
Monocytes Relative: 9 %
NRBC: 0 % (ref 0.0–0.2)
Neutro Abs: 5.5 10*3/uL (ref 1.7–7.7)
Neutrophils Relative %: 74 %
Platelet Count: 203 10*3/uL (ref 150–400)
RBC: 3.84 MIL/uL — ABNORMAL LOW (ref 3.87–5.11)
RDW: 17.8 % — ABNORMAL HIGH (ref 11.5–15.5)
WBC Count: 7.4 10*3/uL (ref 4.0–10.5)

## 2018-07-21 LAB — CMP (CANCER CENTER ONLY)
ALBUMIN: 3.6 g/dL (ref 3.5–5.0)
ALK PHOS: 131 U/L — AB (ref 38–126)
ALT: 8 U/L (ref 0–44)
AST: 15 U/L (ref 15–41)
Anion gap: 11 (ref 5–15)
BUN: 33 mg/dL — AB (ref 8–23)
CALCIUM: 9.3 mg/dL (ref 8.9–10.3)
CHLORIDE: 100 mmol/L (ref 98–111)
CO2: 28 mmol/L (ref 22–32)
CREATININE: 1.66 mg/dL — AB (ref 0.44–1.00)
GFR, Est AFR Am: 36 mL/min — ABNORMAL LOW (ref >60)
GFR, Estimated: 31 mL/min — ABNORMAL LOW (ref >60)
Glucose, Bld: 98 mg/dL (ref 70–99)
Potassium: 4.1 mmol/L (ref 3.5–5.1)
SODIUM: 139 mmol/L (ref 135–145)
Total Bilirubin: 0.2 mg/dL — ABNORMAL LOW (ref 0.3–1.2)
Total Protein: 7 g/dL (ref 6.5–8.1)

## 2018-07-22 ENCOUNTER — Ambulatory Visit (HOSPITAL_COMMUNITY)
Admission: RE | Admit: 2018-07-22 | Discharge: 2018-07-22 | Disposition: A | Discharge status: Home / Self Care | Payer: Medicare Other | Source: Ambulatory Visit | Attending: Internal Medicine | Admitting: Internal Medicine

## 2018-07-22 ENCOUNTER — Encounter (HOSPITAL_COMMUNITY): Payer: Self-pay

## 2018-07-22 VITALS — BP 118/62 | HR 75 | Wt 172.0 lb

## 2018-07-22 DIAGNOSIS — Z9981 Dependence on supplemental oxygen: Secondary | ICD-10-CM | POA: Insufficient documentation

## 2018-07-22 DIAGNOSIS — I2510 Atherosclerotic heart disease of native coronary artery without angina pectoris: Secondary | ICD-10-CM | POA: Diagnosis not present

## 2018-07-22 DIAGNOSIS — Z85118 Personal history of other malignant neoplasm of bronchus and lung: Secondary | ICD-10-CM | POA: Insufficient documentation

## 2018-07-22 DIAGNOSIS — Z8249 Family history of ischemic heart disease and other diseases of the circulatory system: Secondary | ICD-10-CM | POA: Insufficient documentation

## 2018-07-22 DIAGNOSIS — Z79899 Other long term (current) drug therapy: Secondary | ICD-10-CM | POA: Insufficient documentation

## 2018-07-22 DIAGNOSIS — J9611 Chronic respiratory failure with hypoxia: Secondary | ICD-10-CM | POA: Insufficient documentation

## 2018-07-22 DIAGNOSIS — E785 Hyperlipidemia, unspecified: Secondary | ICD-10-CM | POA: Insufficient documentation

## 2018-07-22 DIAGNOSIS — Z7982 Long term (current) use of aspirin: Secondary | ICD-10-CM | POA: Diagnosis not present

## 2018-07-22 DIAGNOSIS — I5032 Chronic diastolic (congestive) heart failure: Secondary | ICD-10-CM | POA: Insufficient documentation

## 2018-07-22 DIAGNOSIS — I951 Orthostatic hypotension: Secondary | ICD-10-CM

## 2018-07-22 DIAGNOSIS — Z833 Family history of diabetes mellitus: Secondary | ICD-10-CM | POA: Insufficient documentation

## 2018-07-22 DIAGNOSIS — Z9221 Personal history of antineoplastic chemotherapy: Secondary | ICD-10-CM | POA: Insufficient documentation

## 2018-07-22 DIAGNOSIS — N183 Chronic kidney disease, stage 3 unspecified: Secondary | ICD-10-CM

## 2018-07-22 DIAGNOSIS — E1122 Type 2 diabetes mellitus with diabetic chronic kidney disease: Secondary | ICD-10-CM | POA: Diagnosis not present

## 2018-07-22 DIAGNOSIS — I272 Pulmonary hypertension, unspecified: Secondary | ICD-10-CM | POA: Insufficient documentation

## 2018-07-22 DIAGNOSIS — M25559 Pain in unspecified hip: Secondary | ICD-10-CM | POA: Diagnosis not present

## 2018-07-22 DIAGNOSIS — I130 Hypertensive heart and chronic kidney disease with heart failure and stage 1 through stage 4 chronic kidney disease, or unspecified chronic kidney disease: Secondary | ICD-10-CM | POA: Diagnosis not present

## 2018-07-22 DIAGNOSIS — Z794 Long term (current) use of insulin: Secondary | ICD-10-CM | POA: Diagnosis not present

## 2018-07-22 MED ORDER — MIDODRINE HCL 5 MG PO TABS: 5.0000 mg | ORAL_TABLET | Freq: Three times a day (TID) | ORAL | 3 refills | Status: DC

## 2018-07-22 NOTE — Patient Instructions (Addendum)
You have been referred to Pulmonary Rehab. They will contact you in order to schedule your appointment.   START Midodrine 54m (1 tab) three times daily  RESTART Selexipag. You will need to call their pharmacy in order to restart this process.    [facility] [phone] Follow up with Dr. MAundra Dubinin 2 months

## 2018-07-22 NOTE — Progress Notes (Signed)
PCP: Dr. Justin Mend HF Cardiology: Dr. Aundra Dubin  73 y.o. with history of hypoxemic respiratory failure, chronic diastolic CHF, pulmonary hypertension, prior lung cancer, and CKD stage 3 returns for followup of pulmonary hypertension.  Patient wears home oxygen.  PFTs in 3/18 showed moderate-severe restriction but only minimal obstruction.  CTA chest in 1/18 showed relatively normal lung parenchyma and V/Q scan in 8/18 showed no chronic or acute PE.  Echo in 8/18 showed normal LV EF with dilated and dysfunctional RV.  She was admitted in 10/18 with acute/chronic diastolic CHF and was diuresed with IV Lasix.    We had planned a RHC in the fall of 2018 but she developed a Lisfranc fracture in her left foot requiring internal fixation and also had an admission with urosepsis.  RHC was postponed. Echo in 11/18 showed EF 55%, RV moderately dilated, PASP 51 mmHg. RHC was later done in 1/19, showing moderate PAH.   Patient was admitted in 9/19 with presyncope. She was orthostatic.  Opsumit, lisinopril, and spironolactone were stopped. She developed visual changes/blurry vision and stopped tadalafil.  Stopping tadalafil did not help.  She is seeing an ophthalmologist for the visual changes, MRI head was unremarkable.  Echo in 9/19 showed EF 55-60% with septal flattening, mildly dilated RV with moderately decreased systolic function, PA systolic pressure 81 mmHg.   Today she returns for 3 week follow up. Last visit, lasix was decreased and spiro was stopped with orthostasis. She is no longer having dizziness, but is still a little orthostatic today. She stopped taking selexipag on 1/7 because she was having a bad headache and join pains, which started once she got to 1200 dose. She did feel like it was helping somewhat with her symptoms. Remains SOB after walking short distances. Wears 3 L O2 at all times. No edema. Sleeps on 2 pillows at baseline. She has a chronic cough with clear sputum. No fever or chills. She has  occasional left sided chest pressure that does not occur with activity. She has had this for a long time and it has not changed in frequency or severity. Appetite okay. Declines 6MW because she has hip pain and forgot to take her daily Tylenol today. Weights up 4 lbs since she decreased lasix.   6 minute walk (4/19): 137 m  Labs (10/18): K 4, creatinine 1.46, hgb 14.5 Labs (1/19): K 3, creatinine 1.8 => 1.23 Labs (4/19): LDL 257, K 4, creatinine 1.67 Labs (10/19): ESR 83, K 3.6, creatinine 1.5 => 1.57, ANA negative, anti-centromere Ab negative.   ECG (personally reviewed): NSR, left axis deviation  PMH: 1. Chronic hypoxemic respiratory failure: On home oxygen.  2. COPD: This diagnosis has been listed, but PFTs in 3/18 did not show significant obstruction.  3. Chronic diastolic CHF: With cor pulmonale.  - Echo (8/18): EF 65-70%, severe RV dilation with decreased RV systolic function, PASP 67 mmHg.  - Echo (11/18): EF 55%, PASP 51 mmHg, RV moderately dilated.  - Echo (9/19): EF 55-60%, septal flattening, PASP 81 mmHg, RV mildly dilated with moderately decreased systolic function.  4. Type II diabetes 5. HTN 6. Hyperlipidemia 7. CKD: Stage 3.  8. CAD: LHC (2015) with 40% pLAD, 50% mLAD.  9. Lung cancer: Treated in 2010 with chemotherapy and resection.  No recurrence.  10. 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   - V/Q scan negative for chronic PE 8/18.  - CTA chest (1/18): No PE, no emphysema, no ILD. - PFTs (3/18): moderate-severe restriction with minimal obstruction,  severely decreased DLCO. FVC 63%, FEV1 72%, ratio 114%.  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  - Sleep study (2/19): No OSA, there is nocturnal hypoxemia.  - RHC (1/19): mean RA 7, PA 55/16 mean 34, mean PCWP 15, CI 2.34.   SH: son and daughter in [place], quit smoking in 2008.   Family History  Problem Relation Age of Onset  . Diabetes Father   . Heart failure Father   .  Cancer Sister   . Diabetes Brother   . Heart failure Brother    Review of systems complete and found to be negative unless listed in HPI.   Current Outpatient Medications  Medication Sig Dispense Refill  . acetaminophen (TYLENOL) 325 MG tablet Take 650 mg by mouth every 6 (six) hours as needed for moderate pain or fever.     [name] aspirin EC 81 MG tablet Take 81 mg by mouth daily.     . Cholecalciferol (VITAMIN D) 2000 units CAPS Take 2,000 Units by mouth daily.    . Fluticasone-Salmeterol (ADVAIR) 250-50 MCG/DOSE AEPB Inhale 1 puff into the lungs 2 (two) times daily as needed (shortness of breath/wheezing).     . furosemide (LASIX) 40 MG tablet Take 1 tablet (40 mg total) by mouth 2 (two) times daily. 60 tablet 3  . gabapentin (NEURONTIN) 300 MG capsule Take 300 mg by mouth 3 (three) times daily.    [name] guaiFENesin (MUCINEX) 600 MG 12 hr tablet Take 1 tablet (600 mg total) by mouth 2 (two) times daily as needed for cough or to loosen phlegm. 20 tablet 0  . Insulin Detemir (LEVEMIR FLEXTOUCH) 100 UNIT/ML Pen Inject 36 Units into the skin at bedtime.     . insulin lispro (HUMALOG) 100 UNIT/ML injection Inject 2-11 Units into the skin See admin instructions. Inject 6 units with each meal and add additional units per sliding scale: BGL 101-150 = 2 units; 151-200 = 3 units; 201-250 = 5 units; 251-300 = 7 units; 301-350 = 9 units; >350 = 11 units; CALL MD IF BGL IS >400 OR <60    . ipratropium-albuterol (DUONEB) 0.5-2.5 (3) MG/3ML SOLN Take 3 mLs by nebulization every 4 (four) hours as needed (wheezing, Shortness of breath). 360 mL 0  . macitentan (OPSUMIT) 10 MG tablet Take 1 tablet (10 mg total) by mouth daily. 30 tablet   . OXYGEN Inhale 3 L into the lungs continuous. FOR COPD    . potassium chloride SA (K-DUR,KLOR-CON) 20 MEQ tablet Take 2 tablets (40 mEq total) by mouth daily. 60 tablet 3  . Selexipag (UPTRAVI) 200 MCG TABS Take 200 mcg by mouth 2 (two) times daily.    . traMADol (ULTRAM) 50 MG  tablet Take 1 tablet (50 mg total) by mouth every 8 (eight) hours as needed (for pain). 10 tablet 0  . TRULICITY 3.30 QT/6.2UQ SOPN Inject 0.5 mLs as directed every Sunday.     [name] atorvastatin (LIPITOR) 40 MG tablet Take 1 tablet (40 mg total) by mouth daily. 90 tablet 3  . midodrine (PROAMATINE) 5 MG tablet Take 1 tablet (5 mg total) by mouth 3 (three) times daily with meals. 30 tablet 3   No current facility-administered medications for this encounter.    Facility-Administered Medications Ordered in Other Encounters  Medication Dose Route Frequency Provider Last Rate Last Dose  . diatrizoate meglumine-sodium (GASTROGRAFIN) 66-10 % solution 30 mL  30 mL Oral PRN Bjorn Loser, MD   30 mL at 11/30/15 0820  BP 118/62   Pulse 75   Wt 78 kg (172 lb)   SpO2 96% Comment: 3l  BMI 32.50 kg/m    Wt Readings from Last 3 Encounters:  07/22/18 78 kg (172 lb)  06/29/18 78.6 kg (173 lb 3.2 oz)  04/22/18 77.7 kg (171 lb 3.2 oz)   Orthostatics: 118/62 sitting 102/58 standing, no dizziness  General:Elderly. No resp difficulty. Walked into clinic with walker.  HEENT: Normal Neck: Supple. JVP ~8. Carotids 2+ bilat; no bruits. No thyromegaly or nodule noted. Cor: PMI nondisplaced. RRR, No M/G/R noted Lungs: decreased in bases Abdomen: Soft, non-tender, non-distended, no HSM. No bruits or masses. +BS  Extremities: No cyanosis, clubbing, or rash. R and LLE trace edema. BLE TED hose on.   Neuro: Alert & orientedx3, cranial nerves grossly intact. moves all 4 extremities w/o difficulty. Affect pleasant  Assessment/Plan: 1. 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  Serologies negative.  Restrictive PFTs but lung parenchyma looked relatively normal on 1/18 CTA chest.  Findings by PFTs and CT not consistent with emphysema. V/Q scan negative for chronic or acute PEs. Serologic workup was negative. RHC (1/19) showed moderate pulmonary arterial hypertension. Echo in 8/18 showed dilated and  dysfunctional RV, similar in 11/18 and 9/19.  Echo in 5/05 showed PA systolic pressure still very high at 81 mmHg.  Sleep study showed nocturnal hypoxemia but not OSA.  She wears home oxygen.  She is currently on Opsumit and selexipag and thinks that selexipag has improved her exercise tolerance.  - Continue to wear home oxygen.  - Continue Opsumit.  - Given visual changes, will not have her restart tadalafil.   - Continue titrating up selexipag. She stopped taking 1/7 due to severe HA and join pains. She was tolerating up until she got to 1200. Discussed with Dr Aundra Dubin, who recommends she restart and stay at highest dose tolerated. - Refer to pulmonary rehab.  - Declines 6MW today. She has hip pain and did not take her tylenol today.  2. Chronic hypoxemic respiratory failure: ?Primarily due to pulmonary hypertension.  - Referred to pulmonary last visit. We called pulmonary office to follow up on this. She has been seen by Dr Vaughan Browner in the past. Encouraged her to make f/u appointment.  3. Chronic diastolic CHF: With prominent RV failure. Volume okay on exam - Continue lower dose of lasix 40 mg BID. BMET yesterday with stable creatinine 1.66. - Off spiro with orthostasis.  - Add midodrine as below.  4. CKD: Stage 3. Creatinine stable at 1.66 on labwork 07/21/18  5. CAD: Nonobstructive in 2015.  No exertional CP.  LDL very high in 4/19 but was not on statin. I do not think that the statin caused her unilateral hip pain.  - Continue atorvastatin 40 mg daily.  6. Orthostatic hypotension: RV failure may play a role.   - Continue compression stockings during the day.  - Remains slightly orthostatic, but no longer dizzy.  - Add midodrine 5 mg TID  Labs reviewed from yesterday. Add midodrine as above. Pt instructed to call selexipag pharmacy and to titrate to highest tolerated dose. Declined 6MW due to hip pain. Referred to pulmonary rehab.   Georgiana Shore, NP 07/22/2018  Greater than 50% of the 25  minute visit was spent in counseling/coordination of care regarding disease state education, salt/fluid restriction, sliding scale diuretics, and medication compliance.

## 2018-07-26 ENCOUNTER — Telehealth: Payer: Self-pay | Admitting: *Deleted

## 2018-07-26 ENCOUNTER — Inpatient Hospital Stay: Payer: Medicare Other | Admitting: Internal Medicine

## 2018-07-26 ENCOUNTER — Other Ambulatory Visit (HOSPITAL_COMMUNITY): Payer: Self-pay

## 2018-07-26 ENCOUNTER — Telehealth: Payer: Self-pay | Admitting: Internal Medicine

## 2018-07-26 MED ORDER — SELEXIPAG 200 MCG PO TABS: 200.0000 ug | ORAL_TABLET | Freq: Two times a day (BID) | ORAL | 3 refills | Status: DC

## 2018-07-26 NOTE — Telephone Encounter (Signed)
Pt called with concerns about weather. "It's raining and I'm afraid I will fall with my oxygen and my walker. Request to r/s today's appt. Message to scheduling.

## 2018-07-26 NOTE — Telephone Encounter (Signed)
Called pt 2x no answer and no vmail - per 1/13 sch message.

## 2018-07-27 ENCOUNTER — Other Ambulatory Visit (HOSPITAL_COMMUNITY): Payer: Self-pay

## 2018-07-27 DIAGNOSIS — I5032 Chronic diastolic (congestive) heart failure: Secondary | ICD-10-CM

## 2018-07-30 NOTE — Telephone Encounter (Signed)
Unable to contact pt.

## 2018-08-02 ENCOUNTER — Telehealth (HOSPITAL_COMMUNITY): Payer: Self-pay

## 2018-08-02 NOTE — Telephone Encounter (Signed)
Called pt in regards to referral received for pt to participate in Pulmonary rehab. Pt did not answer and unable to leave message. Will try again at later time.   Joycelyn Man RN, BSN Cardiac and Pulmonary Rehab RN

## 2018-08-03 ENCOUNTER — Telehealth: Payer: Self-pay | Admitting: Internal Medicine

## 2018-08-03 NOTE — Telephone Encounter (Signed)
Tried to call regarding voicemail

## 2018-08-05 ENCOUNTER — Telehealth (HOSPITAL_COMMUNITY): Payer: Self-pay

## 2018-08-05 NOTE — Telephone Encounter (Signed)
Pt called about Uptarvi 233m what pharmacy to send script to, pt stated that it normally comes to her house via mail, pt stated she would call them tomorrow and call [place] [facility] if it will contune being mailed to her or if [facility]

## 2018-08-10 ENCOUNTER — Other Ambulatory Visit (HOSPITAL_COMMUNITY): Payer: Self-pay

## 2018-08-10 MED ORDER — SELEXIPAG 200 MCG PO TABS: 200.0000 ug | ORAL_TABLET | Freq: Two times a day (BID) | ORAL | 3 refills | Status: DC

## 2018-08-12 ENCOUNTER — Telehealth: Payer: Self-pay | Admitting: *Deleted

## 2018-08-12 NOTE — Telephone Encounter (Signed)
Returned call to pt regarding 1/13 appt to be r/s. Pt new appt 2/25 0845. Pt advised she cannot arrive this early and would like to be seen at a later time in the day. Message to scheduling, advised pt to expect a call with new appt time.

## 2018-08-13 ENCOUNTER — Telehealth: Payer: Self-pay | Admitting: Internal Medicine

## 2018-08-13 NOTE — Telephone Encounter (Signed)
Called patient per 1/30 sch message - unable to reach pt and vmail not set up

## 2018-08-18 ENCOUNTER — Emergency Department (HOSPITAL_COMMUNITY)
Admission: EM | Admit: 2018-08-18 | Discharge: 2018-08-18 | Disposition: A | Discharge status: Home / Self Care | Payer: Medicare Other | Attending: Emergency Medicine | Admitting: Emergency Medicine

## 2018-08-18 ENCOUNTER — Emergency Department (HOSPITAL_COMMUNITY): Payer: Medicare Other

## 2018-08-18 ENCOUNTER — Emergency Department (HOSPITAL_BASED_OUTPATIENT_CLINIC_OR_DEPARTMENT_OTHER): Payer: Medicare Other

## 2018-08-18 DIAGNOSIS — R609 Edema, unspecified: Secondary | ICD-10-CM | POA: Diagnosis not present

## 2018-08-18 DIAGNOSIS — I5032 Chronic diastolic (congestive) heart failure: Secondary | ICD-10-CM | POA: Diagnosis not present

## 2018-08-18 DIAGNOSIS — J449 Chronic obstructive pulmonary disease, unspecified: Secondary | ICD-10-CM | POA: Insufficient documentation

## 2018-08-18 DIAGNOSIS — Z87891 Personal history of nicotine dependence: Secondary | ICD-10-CM | POA: Insufficient documentation

## 2018-08-18 DIAGNOSIS — J918 Pleural effusion in other conditions classified elsewhere: Secondary | ICD-10-CM | POA: Insufficient documentation

## 2018-08-18 DIAGNOSIS — I130 Hypertensive heart and chronic kidney disease with heart failure and stage 1 through stage 4 chronic kidney disease, or unspecified chronic kidney disease: Secondary | ICD-10-CM | POA: Diagnosis not present

## 2018-08-18 DIAGNOSIS — Z794 Long term (current) use of insulin: Secondary | ICD-10-CM | POA: Diagnosis not present

## 2018-08-18 DIAGNOSIS — Z79899 Other long term (current) drug therapy: Secondary | ICD-10-CM | POA: Diagnosis not present

## 2018-08-18 DIAGNOSIS — E119 Type 2 diabetes mellitus without complications: Secondary | ICD-10-CM | POA: Insufficient documentation

## 2018-08-18 DIAGNOSIS — R0602 Shortness of breath: Secondary | ICD-10-CM | POA: Diagnosis present

## 2018-08-18 DIAGNOSIS — N183 Chronic kidney disease, stage 3 (moderate): Secondary | ICD-10-CM | POA: Insufficient documentation

## 2018-08-18 DIAGNOSIS — Z7982 Long term (current) use of aspirin: Secondary | ICD-10-CM | POA: Diagnosis not present

## 2018-08-18 DIAGNOSIS — J90 Pleural effusion, not elsewhere classified: Secondary | ICD-10-CM

## 2018-08-18 LAB — CBC WITH DIFFERENTIAL/PLATELET
Abs Immature Granulocytes: 0.03 10*3/uL (ref 0.00–0.07)
BASOS ABS: 0 10*3/uL (ref 0.0–0.1)
BASOS PCT: 1 %
EOS ABS: 0.2 10*3/uL (ref 0.0–0.5)
Eosinophils Relative: 3 %
HCT: 30.8 % — ABNORMAL LOW (ref 36.0–46.0)
Hemoglobin: 9 g/dL — ABNORMAL LOW (ref 12.0–15.0)
Immature Granulocytes: 1 %
LYMPHS PCT: 10 %
Lymphs Abs: 0.6 10*3/uL — ABNORMAL LOW (ref 0.7–4.0)
MCH: 23.9 pg — AB (ref 26.0–34.0)
MCHC: 29.2 g/dL — AB (ref 30.0–36.0)
MCV: 81.9 fL (ref 80.0–100.0)
Monocytes Absolute: 0.6 10*3/uL (ref 0.1–1.0)
Monocytes Relative: 9 %
NEUTROS ABS: 4.7 10*3/uL (ref 1.7–7.7)
NEUTROS PCT: 76 %
NRBC: 0 % (ref 0.0–0.2)
PLATELETS: 201 10*3/uL (ref 150–400)
RBC: 3.76 MIL/uL — AB (ref 3.87–5.11)
RDW: 17.3 % — ABNORMAL HIGH (ref 11.5–15.5)
WBC: 6.1 10*3/uL (ref 4.0–10.5)

## 2018-08-18 LAB — COMPREHENSIVE METABOLIC PANEL
ALBUMIN: 3.3 g/dL — AB (ref 3.5–5.0)
ALK PHOS: 116 U/L (ref 38–126)
ALT: 15 U/L (ref 0–44)
ANION GAP: 8 (ref 5–15)
AST: 20 U/L (ref 15–41)
BUN: 19 mg/dL (ref 8–23)
CALCIUM: 9.3 mg/dL (ref 8.9–10.3)
CO2: 30 mmol/L (ref 22–32)
Chloride: 104 mmol/L (ref 98–111)
Creatinine, Ser: 1.21 mg/dL — ABNORMAL HIGH (ref 0.44–1.00)
GFR calc non Af Amer: 45 mL/min — ABNORMAL LOW (ref >60)
GFR, EST AFRICAN AMERICAN: 52 mL/min — AB (ref >60)
GLUCOSE: 112 mg/dL — AB (ref 70–99)
Potassium: 3.1 mmol/L — ABNORMAL LOW (ref 3.5–5.1)
SODIUM: 142 mmol/L (ref 135–145)
TOTAL PROTEIN: 6.4 g/dL — AB (ref 6.5–8.1)
Total Bilirubin: 0.4 mg/dL (ref 0.3–1.2)

## 2018-08-18 LAB — MAGNESIUM: Magnesium: 2.1 mg/dL (ref 1.7–2.4)

## 2018-08-18 LAB — I-STAT TROPONIN, ED
TROPONIN I, POC: 0.02 ng/mL (ref 0.00–0.08)
Troponin i, poc: 0.03 ng/mL (ref 0.00–0.08)

## 2018-08-18 LAB — LIPASE, BLOOD: Lipase: 24 U/L (ref 11–51)

## 2018-08-18 LAB — D-DIMER, QUANTITATIVE (NOT AT ARMC): D DIMER QUANT: 1.23 ug{FEU}/mL — AB (ref 0.00–0.50)

## 2018-08-18 MED ORDER — IOPAMIDOL (ISOVUE-370) INJECTION 76%
INTRAVENOUS | Status: AC
  Filled 2018-08-18: qty 100

## 2018-08-18 MED ORDER — IOPAMIDOL (ISOVUE-370) INJECTION 76%
100.0000 mL | Freq: Once | INTRAVENOUS | Status: AC | PRN
  Administered 2018-08-18: 100 mL via INTRAVENOUS

## 2018-08-18 NOTE — ED Notes (Signed)
Patient verbalizes understanding of discharge instructions. Opportunity for questioning and answers were provided. Armband removed by staff, pt discharged from ED in wheelchair.

## 2018-08-18 NOTE — Progress Notes (Signed)
Left lower extremity venous duplex exam completed. Please see preliminary notes on CV PROC under chart review. Ebbie Sorenson H Jorge Amparo(RDMS RVT) 08/18/18 6:13 PM

## 2018-08-18 NOTE — ED Provider Notes (Signed)
[facility] EMERGENCY DEPARTMENT Provider Note   CSN: [id number] Arrival date & time: 08/18/18  1625     History   Chief Complaint Chief Complaint  Patient presents with  . Shortness of Breath    HPI Amy Shepard is a 73 y.o. female.  The history is provided by the patient and medical records. No language interpreter was used.  Shortness of Breath  Severity:  Severe Onset quality:  Gradual Duration:  1 week Timing:  Intermittent Progression:  Waxing and waning Chronicity:  Recurrent Relieved by:  Nothing Worsened by:  Exertion Ineffective treatments:  None tried Associated symptoms: chest pain and diaphoresis   Associated symptoms: no abdominal pain, no claudication, no cough, no fever, no headaches, no neck pain, no rash, no syncope, no vomiting and no wheezing   Chest pain:    Quality: crushing and pressure     Severity:  Moderate   Onset quality:  Gradual   Timing:  Intermittent   Progression:  Waxing and waning   Chronicity:  New Risk factors: hx of cancer   Chest Pain  Associated symptoms: back pain, diaphoresis, fatigue, palpitations and shortness of breath   Associated symptoms: no abdominal pain, no claudication, no cough, no dizziness, no fever, no headache, no nausea, no numbness, no syncope, no vomiting and no weakness     Past Medical History:  Diagnosis Date  . Arthritis   . CAD (coronary artery disease)    a. Prior cath 2015 showed 40% prox AD, 50-50% mLAD, otherwise calcification but no obstruction in LCx/RCA.. Medical management recommended. b. 2017: low-risk NST.  [name] Chronic diastolic CHF (congestive heart failure) ([place])   . Chronic respiratory failure ([place])   . CKD (chronic kidney disease), stage III ([place])   . COPD (chronic obstructive pulmonary disease) ([place])   . Cor pulmonale ([name])    a. felt due to advanced COPD and noncompliance with O2.  . Depression   . Diabetes mellitus    [name]    dx  2008  . Fracture    left foot  . Hx of seasonal allergies   . Hypercholesteremia   . Hypertension    on no medications  . Lung cancer ([place])   . On home oxygen therapy    "2.5L; 24/7" (03/10/2017)  . Pericardial effusion    a. small-moderate in 07/2016.  . Pulmonary hypertension ([place])   . UTI (urinary tract infection)     Patient Active Problem List   Diagnosis Date Noted  . Pain in joint, ankle and foot 04/10/2018  . Closed left fibular fracture 04/10/2018  . CKD (chronic kidney disease), stage IV ([place]) 04/09/2018  . Near syncope 04/09/2018  . Dyspnea   . Bronchitis, acute 03/11/2018  . Fatigue 03/11/2018  . Anemia due to stage 4 chronic kidney disease ([place]) 03/11/2018  . Pulmonary fibrosis ([place]) 03/11/2018  . Pulmonary HTN ([place]) 03/11/2018  . Anemia 03/11/2018  . Lisfranc dislocation, left, subsequent encounter 06/08/2017  . Chronic respiratory failure with hypoxia ([place]) 06/03/2017  . Falls 06/03/2017  . Hypokalemia 06/03/2017  . Sepsis secondary to UTI ([place]) 06/02/2017  . Dog bite 05/02/2017  . CHF exacerbation ([place]) 05/02/2017  . Acute diastolic CHF (congestive heart failure) ([place]) 05/02/2017  . Chronic diastolic CHF (congestive heart failure) ([place]) 04/02/2017  . Cor pulmonale ([place]) 04/02/2017  . Acute on chronic respiratory failure ([place]) 03/10/2017  . Chronic pain 03/10/2017  . Lactic acidosis 02/10/2017  . Acute respiratory failure with hypoxia ([place])  02/09/2017  . Acute on chronic diastolic CHF (congestive heart failure) ([place]) 06/23/2016  . HTN (hypertension) 06/23/2016  . Type 2 diabetes mellitus with complication, with long-term current use of insulin ([place]) 03/11/2016  . Depression 03/11/2016  . Chronic obstructive pulmonary disease ([place]) 03/10/2016  . S/P lumbar spinal fusion 02/22/2016  . Coronary artery disease involving native coronary artery 07/12/2014  . Pain in the chest   . Malignant neoplasm of lower lobe of right lung ([place]) 05/25/2014  . Lung cancer ([place]) 09/01/2012     Past Surgical History:  Procedure Laterality Date  . [facility] in [place]     bilateral cataracts  . LEFT HEART CATHETERIZATION WITH CORONARY ANGIOGRAM N/A 07/12/2014   Procedure: LEFT HEART CATHETERIZATION WITH CORONARY ANGIOGRAM;  Surgeon: [name], MD;  Location: [facility] CATH LAB;  Service: Cardiovascular;  Laterality: N/A;  . MAXIMUM ACCESS (MAS)POSTERIOR LUMBAR INTERBODY FUSION (PLIF) 2 LEVEL N/A 02/22/2016   Procedure: Lumbar three-four - Lumbar four-five  MAXIMUM ACCESS SURGERY  POSTERIOR LUMBAR INTERBODY FUSION;  Surgeon: [name], MD;  Location: [facility] NEURO ORS;  Service: Neurosurgery;  Laterality: N/A;  . ORIF TOE FRACTURE Left 06/12/2017   Procedure: OPEN REDUCTION INTERNAL FIXATION (ORIF) BASE 1ST METATARSAL (TOE) FRACTURE;  Surgeon: Newt Minion, MD;  Location: [facility];  Service: Orthopedics;  Laterality: Left;  . RIGHT HEART CATH N/A 08/06/2017   Procedure: RIGHT HEART CATH;  Surgeon: Larey Dresser, MD;  Location: [facility] CV LAB;  Service: Cardiovascular;  Laterality: N/A;  . THORACOTOMY Right 2010   lower  . TONSILLECTOMY       OB History   No obstetric history on file.      Home Medications    Prior to Admission medications   Medication Sig Start Date End Date Taking? Authorizing Provider  acetaminophen (TYLENOL) 325 MG tablet Take 650 mg by mouth every 6 (six) hours as needed for moderate pain or fever.     [provider]  aspirin EC 81 MG tablet Take 81 mg by mouth daily.     [provider]  atorvastatin (LIPITOR) 40 MG tablet Take 1 tablet (40 mg total) by mouth daily. 04/20/18 07/19/18  Larey Dresser, MD  Cholecalciferol (VITAMIN D) 2000 units CAPS Take 2,000 Units by mouth daily.    [provider]  Fluticasone-Salmeterol (ADVAIR) 250-50 MCG/DOSE AEPB Inhale 1 puff into the lungs 2 (two) times daily as needed (shortness of breath/wheezing).     [provider]  furosemide (LASIX) 40 MG tablet Take 1 tablet (40 mg total) by mouth 2 (two) times daily. 07/20/18   Larey Dresser, MD  gabapentin (NEURONTIN) 300 MG capsule Take 300 mg by mouth 3 (three) times daily.    [provider]  guaiFENesin (MUCINEX) 600 MG 12 hr tablet Take 1 tablet (600 mg total) by mouth 2 (two) times daily as needed for cough or to loosen phlegm. 05/07/17   Aline August, MD  Insulin Detemir (LEVEMIR FLEXTOUCH) 100 UNIT/ML Pen Inject 36 Units into the skin at bedtime.     [provider]  insulin lispro (HUMALOG) 100 UNIT/ML injection Inject 2-11 Units into the skin See admin instructions. Inject 6 units with each meal and add additional units per sliding scale: BGL 101-150 = 2 units; 151-200 = 3 units; 201-250 = 5 units; 251-300 = 7 units; 301-350 = 9 units; >350 =  11 units; CALL MD IF BGL IS >400 OR <60    [provider]  ipratropium-albuterol (DUONEB) 0.5-2.5 (3) MG/3ML SOLN Take 3 mLs by nebulization every 4 (four) hours as needed (wheezing, Shortness of breath). 03/13/16   Johnson, Clanford L, MD  macitentan (OPSUMIT) 10 MG tablet Take 1 tablet (10 mg total) by mouth daily. 04/20/18   Larey Dresser, MD  midodrine (PROAMATINE) 5 MG tablet Take 1 tablet (5 mg total) by mouth 3 (three) times daily with meals. 07/22/18   Georgiana Shore, NP  OXYGEN Inhale 3 L into the lungs continuous. FOR COPD    [provider]  potassium chloride SA (K-DUR,KLOR-CON) 20 MEQ tablet Take 2 tablets (40 mEq total) by mouth daily. 06/29/18   Larey Dresser, MD  Selexipag (UPTRAVI) 200 MCG TABS Take 1 tablet (200 mcg total) by mouth 2 (two) times daily. 08/10/18   Larey Dresser, MD  traMADol (ULTRAM) 50 MG tablet Take 1 tablet (50 mg total) by mouth every 8 (eight) hours as needed (for pain). 06/06/17   Danford, Suann Larry, MD  TRULICITY 0.27 OZ/3.6UY SOPN Inject 0.5 mLs as directed every Sunday.  01/28/18   [provider]    Family  History Family History  Problem Relation Age of Onset  . Diabetes Father   . Heart failure Father   . Cancer Sister   . Diabetes Brother   . Heart failure Brother     Social History Social History   Tobacco Use  . Smoking status: Former Smoker    Packs/day: 1.00    Years: 30.00    Pack years: 30.00    Last attempt to quit: 07/14/2006    Years since quitting: 12.1  . Smokeless tobacco: Never Used  Substance Use Topics  . Alcohol use: No  . Drug use: No     Allergies   Amoxicillin   Review of Systems Review of Systems  Constitutional: Positive for chills, diaphoresis and fatigue. Negative for fever.  HENT: Negative for congestion and rhinorrhea.   Eyes: Negative for visual disturbance.  Respiratory: Positive for chest tightness and shortness of breath. Negative for cough, wheezing and stridor.   Cardiovascular: Positive for chest pain, palpitations and leg swelling. Negative for claudication and syncope.  Gastrointestinal: Positive for diarrhea. Negative for abdominal pain, constipation, nausea and vomiting.  Genitourinary: Negative for dysuria, flank pain and frequency.  Musculoskeletal: Positive for back pain. Negative for neck pain and neck stiffness.  Skin: Negative for rash and wound.  Neurological: Positive for light-headedness. Negative for dizziness, seizures, syncope, weakness, numbness and headaches.  Psychiatric/Behavioral: Negative for agitation.  All other systems reviewed and are negative.    Physical Exam Updated Vital Signs BP (!) 150/70 (BP Location: Left Arm)   Pulse 69   Temp 98.1 F (36.7 C) (Oral)   Resp 16   SpO2 90%   Physical Exam Vitals signs and nursing note reviewed.  Constitutional:      General: She is not in acute distress.    Appearance: She is well-developed. She is not ill-appearing, toxic-appearing or diaphoretic.  HENT:     Head: Normocephalic and atraumatic.     Mouth/Throat:     Pharynx: No pharyngeal swelling or  oropharyngeal exudate.  Eyes:     Conjunctiva/sclera: Conjunctivae normal.     Pupils: Pupils are equal, round, and reactive to light.  Neck:     Musculoskeletal: Normal range of motion and neck supple.  Cardiovascular:  Rate and Rhythm: Normal rate and regular rhythm.     Heart sounds: Murmur present.  Pulmonary:     Effort: Pulmonary effort is normal. Tachypnea present. No respiratory distress.     Breath sounds: Rales present. No wheezing or rhonchi.  Chest:     Chest wall: No tenderness or crepitus.  Abdominal:     Palpations: Abdomen is soft.     Tenderness: There is no abdominal tenderness.  Musculoskeletal:     Right lower leg: She exhibits no tenderness. No edema.     Left lower leg: She exhibits no tenderness. No edema.  Skin:    General: Skin is warm and dry.     Capillary Refill: Capillary refill takes less than 2 seconds.  Neurological:     General: No focal deficit present.     Mental Status: She is alert.      ED Treatments / Results  Labs (all labs ordered are listed, but only abnormal results are displayed) Labs Reviewed  CBC WITH DIFFERENTIAL/PLATELET - Abnormal; Notable for the following components:      Result Value   RBC 3.76 (*)    Hemoglobin 9.0 (*)    HCT 30.8 (*)    MCH 23.9 (*)    MCHC 29.2 (*)    RDW 17.3 (*)    Lymphs Abs 0.6 (*)    All other components within normal limits  COMPREHENSIVE METABOLIC PANEL - Abnormal; Notable for the following components:   Potassium 3.1 (*)    Glucose, Bld 112 (*)    Creatinine, Ser 1.21 (*)    Total Protein 6.4 (*)    Albumin 3.3 (*)    GFR calc non Af Amer 45 (*)    GFR calc Af Amer 52 (*)    All other components within normal limits  D-DIMER, QUANTITATIVE (NOT AT [facility]) - Abnormal; Notable for the following components:   D-Dimer, Quant 1.23 (*)    All other components within normal limits  URINE CULTURE  LIPASE, BLOOD  MAGNESIUM  LACTIC ACID, PLASMA  LACTIC ACID, PLASMA  BRAIN NATRIURETIC  PEPTIDE  URINALYSIS, ROUTINE W REFLEX MICROSCOPIC  I-STAT TROPONIN, ED  I-STAT TROPONIN, ED    EKG EKG Interpretation  Date/Time:  Wednesday August 18 2018 16:25:42 EST Ventricular Rate:  69 PR Interval:    QRS Duration: 112 QT Interval:  426 QTC Calculation: 457 R Axis:   -71 Text Interpretation:  Sinus rhythm Left anterior fascicular block Borderline low voltage, extremity leads Abnormal R-wave progression, late transition Nonspecific T abnormalities, anterior leads When compared to prior, new t wave inversion in leads V2-V4.  No STEMI Confirmed by Antony Blackbird [phone]) on 08/18/2018 5:49:43 PM   Radiology Dg Chest 2 View  Result Date: 08/18/2018 CLINICAL DATA:  74 year old female with chest pain, shortness of breath, lower extremity swelling for 1 week. History of treated lung cancer. EXAM: CHEST - 2 VIEW COMPARISON:  Restaging chest CT 07/21/2018 and earlier. FINDINGS: Upright AP and lateral views of the chest. Stable cardiomegaly and mediastinal contours. Stable lung volumes. Visualized tracheal air column is within normal limits. No pneumothorax. No layering pleural effusion or consolidation. Pulmonary vascularity/increased interstitial markings appear stable since 2018. No acute pulmonary opacity. No acute osseous abnormality identified. Negative visible bowel gas pattern. Abdominal Calcified aortic atherosclerosis. IMPRESSION: Stable cardiomegaly and pulmonary interstitial changes the. No acute cardiopulmonary abnormality. Electronically Signed   By: Genevie Ann M.D.   On: 08/18/2018 18:26   Ct Angio Chest Pe W And/or  Wo Contrast  Result Date: 08/18/2018 CLINICAL DATA:  73 year old female with shortness of breath. Positive D-dimer. History of treated lung cancer. EXAM: CT ANGIOGRAPHY CHEST WITH CONTRAST TECHNIQUE: Multidetector CT imaging of the chest was performed using the standard protocol during bolus administration of intravenous contrast. Multiplanar CT image reconstructions and  MIPs were obtained to evaluate the vascular anatomy. CONTRAST:  147m ISOVUE-370 IOPAMIDOL (ISOVUE-370) INJECTION 76% COMPARISON:  Chest radiographs earlier today. Restaging chest CT 07/21/2018 and earlier. FINDINGS: Cardiovascular: Good contrast bolus timing in the pulmonary arterial tree. Postoperative changes in the right lower lobe. No focal filling defect identified in the pulmonary arteries to suggest acute pulmonary embolism. Calcified aortic atherosclerosis. Calcified coronary artery atherosclerosis. Cardiomegaly with no pericardial effusion. Mediastinum/Nodes: Low level mediastinal lymphadenopathy has increased since January, with nodes now up to 13 millimeters short axis (9 millimeters previously) except for the subcarinal station which is about 2 centimeters and stable. Lungs/Pleura: New small layering right pleural effusion. The lower lung volumes. Atelectatic changes to the major airways. Bilateral pulmonary mosaic attenuation felt related to a combination of atelectasis and gas trapping. Enhancing mild atelectasis in the left costophrenic angle. No other confluent pulmonary opacity. Upper Abdomen: Negative visible liver, spleen, stomach. Musculoskeletal: No acute or suspicious osseous abnormality. Review of the MIP images confirms the above findings. IMPRESSION: 1. Negative for acute pulmonary embolus. 2. New small layering right pleural effusion with lower lung volumes and atelectasis. But no pneumonia identified. 3. Mediastinal lymphadenopathy has increased since January and is nonspecific given history of Lung Cancer. Recommend a repeat Chest CT in April (3 months from prior restaging study). 4. Cardiomegaly. Calcified aortic and coronary artery atherosclerosis. Electronically Signed   By: HGenevie AnnM.D.   On: 08/18/2018 19:39   Vas UKoreaLower Extremity Venous (dvt) (only [facility])  Result Date: 08/18/2018  Lower Venous Study Indications: SOB, and Edema. Other Indications: CHF; Pulmonary hypertension.  Risk Factors: Cancer History of lung cancer. Limitations: Body habitus. Comparison Study: Lower extremity venous duplex exam on 03/13/2017. Performing Technologist: SRudell Cobb Examination Guidelines: A complete evaluation includes B-mode imaging, spectral Doppler, color Doppler, and power Doppler as needed of all accessible portions of each vessel. Bilateral testing is considered an integral part of a complete examination. Limited examinations for reoccurring indications may be performed as noted.  Right Venous Findings: +---+---------------+---------+-----------+----------+-------+    CompressibilityPhasicitySpontaneityPropertiesSummary +---+---------------+---------+-----------+----------+-------+ CFVFull           Yes      Yes                          +---+---------------+---------+-----------+----------+-------+  Left Venous Findings: +---------+---------------+---------+-----------+----------+-------------------+          CompressibilityPhasicitySpontaneityPropertiesSummary             +---------+---------------+---------+-----------+----------+-------------------+ CFV      Full           Yes      Yes                                      +---------+---------------+---------+-----------+----------+-------------------+ SFJ      Full                                                             +---------+---------------+---------+-----------+----------+-------------------+  FV Prox  Full                                                             +---------+---------------+---------+-----------+----------+-------------------+ FV Mid   Full                                                             +---------+---------------+---------+-----------+----------+-------------------+ FV DistalFull                                                             +---------+---------------+---------+-----------+----------+-------------------+ PFV      Full                                                              +---------+---------------+---------+-----------+----------+-------------------+ POP      Full           Yes      Yes                                      +---------+---------------+---------+-----------+----------+-------------------+ PTV                                                   not well visualized +---------+---------------+---------+-----------+----------+-------------------+ PERO                                                  not well visualized +---------+---------------+---------+-----------+----------+-------------------+    Summary: Right: No evidence of common femoral vein obstruction. Left: There is no evidence of deep vein thrombosis in the lower extremity. However, portions of this examination were limited- see technologist comments above. No cystic structure found in the popliteal fossa.  *See table(s) above for measurements and observations. Electronically signed by Curt Jews MD on 08/18/2018 at 9:40:34 PM.    Final     Procedures Procedures (including critical care time)  Medications Ordered in ED Medications  iopamidol (ISOVUE-370) 76 % injection (has no administration in time range)  iopamidol (ISOVUE-370) 76 % injection 100 mL (100 mLs Intravenous Contrast Given 08/18/18 1915)     Initial Impression / Assessment and Plan / ED Course  I have reviewed the triage vital signs and the nursing notes.  Pertinent labs & imaging results that were available during my care of the patient were reviewed by me and considered in my medical decision making (see chart for details).  Amy Shepard is a 73 y.o. female with a past medical history significant for COPD on 3.5 L home oxygen at all times, CAD, CHF, CKD, diabetes, prior pleural effusion, lung cancer, pulmonary hypertension, and recent urinary tract infection who presents with fatigue, chills, nausea, intermittent diaphoresis, chest pressure,  shortness of breath, pain across her upper back, and left leg swelling.  Patient reports that her symptoms been ongoing for the last week.  She says that she has been having shortness of breath that is worsened with exertion.  She reports he had chest pressure that was a 6 of 10 severity.  She reports that it is improved currently.  She reports associated nausea, diaphoresis.  She reports associated palpitations.  She denies pleuritic pain.  She reports her left leg is been swollen for the last few weeks which is new.  She reports that she took several days of antibiotics for urinary traction discomfort last week and had subsequent diarrhea.  She stopped taking the antibiotics.  She denies any dysuria or hematuria.  She reports the diarrhea has improved.  No constipation.  No significant abdominal pain.  No other back pain.  No flank pain.  She has not taken her temperature at home.  On exam, lungs have faint crackles in the bases.  Chest is nontender.  Mild systolic murmur appreciated.  Abdomen was nontender.  Patient is symmetric pulses in upper and lower extremities.  Subjectively patient reports her left leg is swollen however I cannot determine significant swelling.  No significant pitting edema seen.  Normal sensation and strength in all extremities.  No evidence of trauma seen.  Patient is on her home 3.5 L nasal cannula.  EKG will be performed. No STEMI seen.   Due to the leg swelling, shortness of breath and chest pain with history of DVT not on anticoagulation, she will have a DVT ultrasound and d-dimer.  Patient may need CT PE study if dimer is elevated.  Patient will chest x-ray and labs look for occult infection or other cause of her symptoms.  Anticipate reassessment after work-up.  If patient continues to have exertional shortness of breath and chest pain with her history of CAD and other cardiac problems, patient may require admission for observation and further management.  D-dimer was  positive, CT PE study was ordered.  CT scan showed no blood clot but did show evidence of small pleural effusion.  Suspect this is contributing to her shortness of breath and discomfort.  Patient was found to have hypokalemia.  Patient was offered oral potassium.  Other laboratory testing similar to prior.  Patient was feeling better during ED stay.  Based on overall reassuring work-up aside from the pleural effusion and hypokalemia, did not find any significant abnormalities require admission.  Patient did not want to wait for urinalysis, BNP, lactic acids, delta troponin.  As patient was feeling better, she will follow-up with her PCP and be discharged.  Patient understood strict return precautions.  Patient no other questions or concerns and was discharged in good condition on her home oxygen requirement and feeling much better.   Final Clinical Impressions(s) / ED Diagnoses   Final diagnoses:  Shortness of breath  Pleural effusion    ED Discharge Orders    None      Clinical Impression: 1. Shortness of breath   2. Pleural effusion     Disposition: Discharge  Condition: Good  I have discussed the results, Dx and Tx plan with the pt(&  family if present). He/she/they expressed understanding and agree(s) with the plan. Discharge instructions discussed at great length. Strict return precautions discussed and pt &/or family have verbalized understanding of the instructions. No further questions at time of discharge.    Discharge Medication List as of 08/18/2018  9:35 PM      Follow Up: Maurice Small, MD [place] [address] [place] [address] [place] [address] [id number] mc [place]       Castiel Lauricella, Gwenyth Allegra, MD 08/19/18 [phone]

## 2018-08-18 NOTE — ED Notes (Signed)
Pt notified that she needs to provide a UA when possible. Pt verbalized understanding and stated that she has been using the bedpan the last few days due to dizziness. Instructed to use call light.

## 2018-08-18 NOTE — Discharge Instructions (Signed)
Your work-up today showed no evidence of heart injury or blood clot in your lungs.  No pneumonia was seen.  He had a small amount of fluid around 1 of your lungs with a pleural effusion.  As your oxygen requirement is the same as it is and you are feeling better, we feel you are safe for discharge home.  Please follow-up with your primary doctor.  If any symptoms change or worsen, is return to the nearest emergency department.

## 2018-08-18 NOTE — ED Triage Notes (Signed)
Pt arrived via [facility] from home w/ multiple complaints. Pt states she has been getting SOB with exertion. Pt is alert and oriented x4 at time of triage. No distress noted at this time.

## 2018-08-19 ENCOUNTER — Other Ambulatory Visit (HOSPITAL_COMMUNITY): Payer: Self-pay

## 2018-08-19 MED ORDER — MACITENTAN 10 MG PO TABS: 10.0000 mg | ORAL_TABLET | Freq: Every day | ORAL | Status: DC

## 2018-08-25 ENCOUNTER — Telehealth (HOSPITAL_COMMUNITY): Payer: Self-pay

## 2018-08-25 ENCOUNTER — Other Ambulatory Visit (HOSPITAL_COMMUNITY): Payer: Self-pay

## 2018-08-25 MED ORDER — MACITENTAN 10 MG PO TABS: 10.0000 mg | ORAL_TABLET | Freq: Every day | ORAL | 11 refills | Status: DC

## 2018-08-25 NOTE — Telephone Encounter (Signed)
Opsumit refill request faxed to [facility]

## 2018-08-25 NOTE — Telephone Encounter (Signed)
Certificate of medical necessity for oxygen faxed with DM signature

## 2018-08-25 NOTE — Telephone Encounter (Signed)
Amy Shepard prescription refill sent to [facility] for 1031mg tabs with DM signature

## 2018-09-07 ENCOUNTER — Inpatient Hospital Stay: Payer: Medicare Other | Admitting: Internal Medicine

## 2018-09-08 ENCOUNTER — Telehealth (HOSPITAL_COMMUNITY): Payer: Self-pay | Admitting: Cardiology

## 2018-09-08 NOTE — Telephone Encounter (Signed)
Patient returned my call and is aware of new appt on 09/30/2018 _0 :52 with DM.

## 2018-09-08 NOTE — Telephone Encounter (Signed)
Called and left message on daughter/EC, Rayna Gardner's VM asking her to have her mom call our office to reschedule 09/29/2018 appt with DM.  No VM or answer on patient's home/mobile number

## 2018-09-09 ENCOUNTER — Telehealth: Payer: Self-pay | Admitting: Internal Medicine

## 2018-09-09 ENCOUNTER — Telehealth: Payer: Self-pay | Admitting: *Deleted

## 2018-09-09 ENCOUNTER — Inpatient Hospital Stay: Payer: Medicare Other | Admitting: Internal Medicine

## 2018-09-09 NOTE — Telephone Encounter (Signed)
Patient called to cancel today's 1 year follow up with Dr [name].  She states that during the night she has developed a stomach bug.  Reports dairrhea/abdominal cramping/sweating which is normal for her/cough.  Denies being around anyone with previous viral illness.  She hasn't taken her temperature yet today and has taken Pepto and has found some relief in diarrhea and abdominal cramping.  Instructed that if symptoms do not improve or if they worsen then she can call for [facility] visit.  Patient expressed understanding.  Today's appt request sent to scheduling to r/s for later date with Dr. [name].

## 2018-09-09 NOTE — Telephone Encounter (Signed)
R/s appt per 2/27 sch message- unable to reach patient or leave message - sent reminder letter in the mail.

## 2018-09-13 ENCOUNTER — Telehealth (HOSPITAL_COMMUNITY): Payer: Self-pay

## 2018-09-13 NOTE — Telephone Encounter (Signed)
Pt called in regards to referral for pulmonary rehab. Explained to pt what pulmonary rehab is and pt states she is very interested in the program. Pt also stated that she has had 3 falls in the past month. Explained to pt that with the rehab program, safety is of utmost importance and informed pt of Balance program offered at [facility] that she can complete prior to scheduling rehab. Pt agrees and faxed referral to pt PCP. Message also sent to Dr. Aundra Dubin informing him of pt referral to balance program prior to scheduling for rehab.  Joycelyn Man RN, BSN Cardiac and Pulmonary Rehab RN

## 2018-09-17 ENCOUNTER — Telehealth: Payer: Self-pay | Admitting: Medical Oncology

## 2018-09-17 NOTE — Telephone Encounter (Signed)
confirmed appt for 3/11.

## 2018-09-21 ENCOUNTER — Inpatient Hospital Stay: Payer: Medicare Other | Admitting: Internal Medicine

## 2018-09-29 ENCOUNTER — Encounter (HOSPITAL_COMMUNITY): Payer: Medicare Other | Admitting: Cardiology

## 2018-09-30 ENCOUNTER — Encounter (HOSPITAL_COMMUNITY): Payer: Medicare Other | Admitting: Cardiology

## 2018-09-30 ENCOUNTER — Inpatient Hospital Stay: Payer: Medicare Other | Attending: Internal Medicine | Admitting: Internal Medicine

## 2018-09-30 DIAGNOSIS — D649 Anemia, unspecified: Secondary | ICD-10-CM | POA: Insufficient documentation

## 2018-09-30 DIAGNOSIS — Z79899 Other long term (current) drug therapy: Secondary | ICD-10-CM | POA: Insufficient documentation

## 2018-09-30 DIAGNOSIS — E119 Type 2 diabetes mellitus without complications: Secondary | ICD-10-CM | POA: Insufficient documentation

## 2018-09-30 DIAGNOSIS — Z794 Long term (current) use of insulin: Secondary | ICD-10-CM | POA: Insufficient documentation

## 2018-09-30 DIAGNOSIS — Z9221 Personal history of antineoplastic chemotherapy: Secondary | ICD-10-CM | POA: Insufficient documentation

## 2018-09-30 DIAGNOSIS — I10 Essential (primary) hypertension: Secondary | ICD-10-CM | POA: Insufficient documentation

## 2018-09-30 DIAGNOSIS — Z85118 Personal history of other malignant neoplasm of bronchus and lung: Secondary | ICD-10-CM | POA: Insufficient documentation

## 2018-09-30 DIAGNOSIS — Z9981 Dependence on supplemental oxygen: Secondary | ICD-10-CM | POA: Insufficient documentation

## 2018-09-30 DIAGNOSIS — N183 Chronic kidney disease, stage 3 (moderate): Secondary | ICD-10-CM | POA: Insufficient documentation

## 2018-09-30 DIAGNOSIS — Z7982 Long term (current) use of aspirin: Secondary | ICD-10-CM | POA: Insufficient documentation

## 2018-10-01 ENCOUNTER — Telehealth: Payer: Self-pay | Admitting: *Deleted

## 2018-10-01 NOTE — Telephone Encounter (Signed)
Patient called to advise she did not show for her appointment yesterday.  Very fearful of getting virus and is avoiding unnecessary visits and crowd exposure.  Please review scan results and advise if you are willing to review with the patient over the phone or if she should just post pone visit for a month or so.

## 2018-10-01 NOTE — Telephone Encounter (Signed)
We will change her appointment to Evisit once it becomes available next week. Thank you  [name]

## 2018-10-04 NOTE — Telephone Encounter (Signed)
Pt notified.

## 2018-10-05 ENCOUNTER — Ambulatory Visit: Payer: Medicare Other | Admitting: Occupational Therapy

## 2018-10-05 ENCOUNTER — Ambulatory Visit: Payer: Medicare Other | Admitting: Physical Therapy

## 2018-10-06 ENCOUNTER — Telehealth: Payer: Self-pay | Admitting: Medical Oncology

## 2018-10-06 NOTE — Telephone Encounter (Signed)
E-visit scheduled for tomorrow.

## 2018-10-07 ENCOUNTER — Inpatient Hospital Stay (HOSPITAL_BASED_OUTPATIENT_CLINIC_OR_DEPARTMENT_OTHER): Payer: Medicare Other | Admitting: Internal Medicine

## 2018-10-07 ENCOUNTER — Other Ambulatory Visit: Payer: Self-pay

## 2018-10-07 ENCOUNTER — Encounter: Payer: Self-pay | Admitting: Internal Medicine

## 2018-10-07 DIAGNOSIS — Z9221 Personal history of antineoplastic chemotherapy: Secondary | ICD-10-CM

## 2018-10-07 DIAGNOSIS — Z7982 Long term (current) use of aspirin: Secondary | ICD-10-CM | POA: Diagnosis not present

## 2018-10-07 DIAGNOSIS — E119 Type 2 diabetes mellitus without complications: Secondary | ICD-10-CM | POA: Diagnosis not present

## 2018-10-07 DIAGNOSIS — D649 Anemia, unspecified: Secondary | ICD-10-CM | POA: Diagnosis not present

## 2018-10-07 DIAGNOSIS — N183 Chronic kidney disease, stage 3 (moderate): Secondary | ICD-10-CM

## 2018-10-07 DIAGNOSIS — Z794 Long term (current) use of insulin: Secondary | ICD-10-CM | POA: Diagnosis not present

## 2018-10-07 DIAGNOSIS — Z85118 Personal history of other malignant neoplasm of bronchus and lung: Secondary | ICD-10-CM | POA: Diagnosis not present

## 2018-10-07 DIAGNOSIS — C3431 Malignant neoplasm of lower lobe, right bronchus or lung: Secondary | ICD-10-CM

## 2018-10-07 DIAGNOSIS — Z9981 Dependence on supplemental oxygen: Secondary | ICD-10-CM

## 2018-10-07 DIAGNOSIS — Z79899 Other long term (current) drug therapy: Secondary | ICD-10-CM | POA: Diagnosis not present

## 2018-10-07 DIAGNOSIS — C3490 Malignant neoplasm of unspecified part of unspecified bronchus or lung: Secondary | ICD-10-CM

## 2018-10-07 DIAGNOSIS — I10 Essential (primary) hypertension: Secondary | ICD-10-CM

## 2018-10-07 NOTE — Progress Notes (Signed)
Virtual Visit via Telephone Note  I connected with Amy Shepard on 10/07/18 at  9:15 AM EDT by telephone and verified that I am speaking with the correct person using two identifiers.   I discussed the limitations, risks, security and privacy concerns of performing an evaluation and management service by telephone and the availability of in person appointments. I also discussed with the patient that there may be a patient responsible charge related to this service. The patient expressed understanding and agreed to proceed. VISIT DIAGNOSIS: Follow-up visit for scan results.  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   PRIOR THERAPY:  1. Status post right lower lobe superior segmentectomy with lymph node dissection under the care of Dr. Arlyce Dice on July 20, 2008. 2. Status post adjuvant chemotherapy with carboplatin and etoposide for a total of 4 cycles. Last dose was given 10/25/2008.  CURRENT THERAPY: Observation.  History of Present Illness: Amy Shepard 73 y.o. female had a visual telephone visit today.  The patient is feeling fine today with no concerning complaints except for shortness of breath with exertion.  She denied having any sore throat, fever or chills.  She has no chest pain, cough or hemoptysis.  She denied having any recent weight loss or night sweats.  She has no nausea, vomiting, diarrhea or constipation.  The patient had repeat imaging studies performed recently and we are discussing the scan results and further recommendation regarding her condition.  MEDICAL HISTORY: Past Medical History:  Diagnosis Date  . Arthritis   . CAD (coronary artery disease)    a. Prior cath 2015 showed 40% prox AD, 50-50% mLAD, otherwise calcification but no obstruction in LCx/RCA.. Medical management recommended. b. 2017: low-risk NST.  [name] Chronic diastolic CHF (congestive heart failure)  ([place])   . Chronic respiratory failure ([place])   . CKD (chronic kidney disease), stage III ([place])   . COPD (chronic obstructive pulmonary disease) ([place])   . Cor pulmonale ([name])    a. felt due to advanced COPD and noncompliance with O2.  . Depression   . Diabetes mellitus    [name]    dx  2008  . Fracture    left foot  . Hx of seasonal allergies   . Hypercholesteremia   . Hypertension    on no medications  . Lung cancer ([place])   . On home oxygen therapy    "2.5L; 24/7" (03/10/2017)  . Pericardial effusion    a. small-moderate in 07/2016.  . Pulmonary hypertension ([place])   . UTI (urinary tract infection)     ALLERGIES:  is allergic to amoxicillin.  MEDICATIONS:  Current Outpatient Medications  Medication Sig Dispense Refill  . acetaminophen (TYLENOL) 325 MG tablet Take 650 mg by mouth every 6 (six) hours as needed for moderate pain or fever.     [name] aspirin EC 81 MG tablet Take 81 mg by mouth daily.     [name] atorvastatin (LIPITOR) 40 MG tablet Take 1 tablet (40 mg total) by mouth daily. 90 tablet 3  . Cholecalciferol (VITAMIN D) 2000 units CAPS Take 2,000 Units by mouth daily.    . Fluticasone-Salmeterol (ADVAIR) 250-50 MCG/DOSE AEPB Inhale 1 puff into the lungs 2 (two) times daily as needed (shortness of breath/wheezing).     . furosemide (LASIX) 40 MG tablet Take 1 tablet (40 mg total) by mouth 2 (two) times daily. 60 tablet 3  . gabapentin (  NEURONTIN) 300 MG capsule Take 300 mg by mouth 3 (three) times daily.    [name] guaiFENesin (MUCINEX) 600 MG 12 hr tablet Take 1 tablet (600 mg total) by mouth 2 (two) times daily as needed for cough or to loosen phlegm. 20 tablet 0  . Insulin Detemir (LEVEMIR FLEXTOUCH) 100 UNIT/ML Pen Inject 36 Units into the skin at bedtime.     . insulin lispro (HUMALOG) 100 UNIT/ML injection Inject 2-11 Units into the skin See admin instructions. Inject 6 units with each meal and add additional units per sliding scale: BGL 101-150 = 2 units; 151-200 = 3 units; 201-250 =  5 units; 251-300 = 7 units; 301-350 = 9 units; >350 = 11 units; CALL MD IF BGL IS >400 OR <60    . ipratropium-albuterol (DUONEB) 0.5-2.5 (3) MG/3ML SOLN Take 3 mLs by nebulization every 4 (four) hours as needed (wheezing, Shortness of breath). 360 mL 0  . macitentan (OPSUMIT) 10 MG tablet Take 1 tablet (10 mg total) by mouth daily. 30 tablet 11  . midodrine (PROAMATINE) 5 MG tablet Take 1 tablet (5 mg total) by mouth 3 (three) times daily with meals. 30 tablet 3  . OXYGEN Inhale 3 L into the lungs continuous. FOR COPD    . potassium chloride SA (K-DUR,KLOR-CON) 20 MEQ tablet Take 2 tablets (40 mEq total) by mouth daily. 60 tablet 3  . Selexipag (UPTRAVI) 200 MCG TABS Take 1 tablet (200 mcg total) by mouth 2 (two) times daily. 60 tablet 3  . traMADol (ULTRAM) 50 MG tablet Take 1 tablet (50 mg total) by mouth every 8 (eight) hours as needed (for pain). 10 tablet 0  . TRULICITY 0.96 EA/5.4UJ SOPN Inject 0.5 mLs as directed every _0 95 or 96  [facility] [place]     bilateral cataracts  . LEFT HEART CATHETERIZATION WITH CORONARY ANGIOGRAM N/A 07/12/2014   Procedure: LEFT HEART CATHETERIZATION WITH CORONARY ANGIOGRAM;  Surgeon: [name], MD;  Location: [facility] CATH LAB;  Service: Cardiovascular;  Laterality: N/A;  . MAXIMUM ACCESS (MAS)POSTERIOR LUMBAR INTERBODY FUSION (PLIF) 2 LEVEL N/A 02/22/2016   Procedure: Lumbar three-four - Lumbar four-five  MAXIMUM ACCESS SURGERY  POSTERIOR LUMBAR INTERBODY FUSION;  Surgeon: [name], MD;   Location: [place] NEURO ORS;  Service: Neurosurgery;  Laterality: N/A;  . ORIF TOE FRACTURE Left 06/12/2017   Procedure: OPEN REDUCTION INTERNAL FIXATION (ORIF) BASE 1ST METATARSAL (TOE) FRACTURE;  Surgeon: Newt Minion, MD;  Location: [facility];  Service: Orthopedics;  Laterality: Left;  . RIGHT HEART CATH N/A 08/06/2017   Procedure: RIGHT HEART CATH;  Surgeon: Larey Dresser, MD;  Location: [place] CV LAB;  Service: Cardiovascular;  Laterality: N/A;  . THORACOTOMY Right 2010   lower  . TONSILLECTOMY      REVIEW OF SYSTEMS:  A comprehensive review of systems was negative except for: Constitutional: positive for fatigue Respiratory: positive for dyspnea on exertion   LABORATORY DATA: Lab Results  Component Value Date   WBC 6.1 08/18/2018  HGB 9.0 (L) 08/18/2018   HCT 30.8 (L) 08/18/2018   MCV 81.9 08/18/2018   PLT 201 08/18/2018      Chemistry      Component Value Date/Time   NA 142 08/18/2018 1650   NA 137 04/02/2017 1535   NA 136 11/30/2015 0758   K 3.1 (L) 08/18/2018 1650   K 4.2 11/30/2015 0758   CL 104 08/18/2018 1650   CL 104 08/30/2012 0908   CO2 30 08/18/2018 1650   CO2 27 11/30/2015 0758   BUN 19 08/18/2018 1650   BUN 27 04/02/2017 1535   BUN 19.0 11/30/2015 0758   CREATININE 1.21 (H) 08/18/2018 1650   CREATININE 1.66 (H) 07/21/2018 1032   CREATININE 0.77 03/28/2016 1504   CREATININE 1.2 (H) 11/30/2015 0758      Component Value Date/Time   CALCIUM 9.3 08/18/2018 1650   CALCIUM 10.3 11/30/2015 0758   ALKPHOS 116 08/18/2018 1650   ALKPHOS 112 11/30/2015 0758   AST 20 08/18/2018 1650   AST 15 07/21/2018 1032   AST 13 11/30/2015 0758   ALT 15 08/18/2018 1650   ALT 8 07/21/2018 1032   ALT 16 11/30/2015 0758   BILITOT 0.4 08/18/2018 1650   BILITOT 0.2 (L) 07/21/2018 1032   BILITOT 0.39 11/30/2015 0758       RADIOGRAPHIC STUDIES: No results found. Assessment and Plan: This is a very pleasant 73 years old white female with a stage IIB non-small cell  lung cancer mixed with a small cell carcinoma. She status post right lower lobe superior segmentectomy with lymph node dissection followed by adjuvant systemic chemotherapy. The patient has been observation since April 2010. She had repeat CT scan of the chest performed in January 2020 that showed no concerning findings for disease progression.  In early February 2020 the patient had CT angiogram of the chest because of shortness of breath and to rule out pulmonary embolism.  She had no evidence of pulmonary embolism at that time but the scan showed increase in the mediastinal lymph nodes. I discussed the scans results with the patient and recommended for her to have repeat CT scan of the chest in 3 months to rule out any disease recurrence. For the anemia, she was advised to start oral iron tablets 1-2 tablets every day over-the-counter.  Follow Up Instructions: Follow-up visit in 3 months with repeat CT scan of the chest.   I discussed the assessment and treatment plan with the patient. The patient was provided an opportunity to ask questions and all were answered. The patient agreed with the plan and demonstrated an understanding of the instructions.   The patient was advised to call back or seek an in-person evaluation if the symptoms worsen or if the condition fails to improve as anticipated.  I provided 15 minutes of non-face-to-face time during this encounter.   Eilleen Kempf, MD

## 2018-10-08 ENCOUNTER — Telehealth (HOSPITAL_COMMUNITY): Payer: Self-pay

## 2018-10-08 ENCOUNTER — Telehealth (HOSPITAL_COMMUNITY): Payer: Self-pay | Admitting: *Deleted

## 2018-10-08 NOTE — Telephone Encounter (Signed)
Spoke with patient regarding side effects from Uptravi 877m BID. Pt reports to be experiencing headaches with this dose. Pt reports are mild but constant.  She reports being able to tolerate 6071m  Advised can cut dose down to 60058mID per MD McLAundra Dubinhe verbalized understanding and will call office of any changes or concerns at home.

## 2018-10-08 NOTE — Telephone Encounter (Signed)
Called to follow up with pt regarding overall well being and pulmonary rehab.  Pt seen in follow up on 10/07/18 by Dr. [name].  Asked pt had she been contacted by the boost program.  Pt indicated that she had not.  Called Dr. Maurice Small office in follow up and requested referral that was sent to the Boost Program on 09/14/18 be faxed again. Advised pt that this may be delayed due to closures and restrictions from Covid-19.  Pt asked for general exercise guidelines.  Asked Seward Carol EP to provide pt with exercise she could do safely at home using rolater, pulse oximetry and seated exercise.  Pt can increase her time as tolerated and if her 02 sat drops below 90 to stop rest and PLB.  Pt also remarked that she was having side effects from her medication Uptravi.  Pt has a severe HA when she increases her dose above 664m. Even with the 606mshe has a dull headache that is somewhat tolerable. Pt has left messages with the heart failure clinic with no return call.  Will reach out Dr. mcAundra Dubino advise him of this complaint. Pt advised we will follow back up when our department is no longer closed due to [place], BSN Cardiac and Pulmonary Rehab Nurse Navigator

## 2018-10-08 NOTE — Telephone Encounter (Signed)
-----  Message from Larey Dresser, MD sent at 10/08/2018  4:46 PM EDT ----- Regarding: FW: Side-effect with Malvin Johns Take 600 bid if she can tolerate this.  ----- Message ----- From: Rowe Pavy, RN Sent: 10/08/2018   4:09 PM EDT To: Larey Dresser, MD Subject: Side-effect with Malvin Johns                        Dr.Mclean,  Hope that you are doing well and staying safe. I reached out to the above pt in follow up for Pulmonary Rehab.  Pt shared with me that she has a severe headache when she takes 800 mg of Uptravi.  She is able to tolerate somewhat the 600 mg dose.  She reports a dull headache that is mostly tolerable.  She stated that she did talk to the pharmacist where she gets her medication and was advised to just take the 600 mg and follow up with her doctor.  Pt left messages at the HF clinic to let someone know and if this was ok with no return call.  I did tell her that since no one had returned her call this  may be because of covid-19. I told her that I would send you an inbasket message which she appreciated.  Thanks so much Carlette

## 2018-10-14 ENCOUNTER — Telehealth (HOSPITAL_COMMUNITY): Payer: Self-pay

## 2018-10-14 NOTE — Telephone Encounter (Signed)
Amy Shepard dosage/titration signed by DM faxed

## 2018-10-15 MED ORDER — SELEXIPAG 200 MCG PO TABS: 600.0000 ug | ORAL_TABLET | Freq: Two times a day (BID) | ORAL | 3 refills | Status: DC

## 2018-10-15 NOTE — Addendum Note (Signed)
Addended by: Valeda Malm on: 10/15/2018 11:00 AM   Modules accepted: Orders

## 2018-10-22 ENCOUNTER — Telehealth (HOSPITAL_COMMUNITY): Payer: Self-pay

## 2018-10-22 MED ORDER — SELEXIPAG 200 MCG PO TABS: 600.0000 ug | ORAL_TABLET | Freq: Two times a day (BID) | ORAL | 11 refills | Status: DC

## 2018-10-22 NOTE — Telephone Encounter (Signed)
Received fax for refill of uptravi.  Called [facility] to clarify new dose of Uptravi. As patient couldn't tolerate 858mg BID, new script phoned in for 6031m BID. See note on 10/08/2018.

## 2018-11-17 ENCOUNTER — Telehealth (HOSPITAL_COMMUNITY): Payer: Self-pay | Admitting: Cardiology

## 2018-11-17 NOTE — Telephone Encounter (Signed)
Triage message received from [place] [facility] [place] [phone] -requests a virtual visit to address b/p  Detailed message left for Nita that I will reach out to the patient to further assess and schedule an appt if needed  Attempted to contact patient, no answer unable to leave message. Will attempt later

## 2018-11-18 NOTE — Telephone Encounter (Signed)
Spoke with patient-patient reports her b/p was 154/72  Mild dizziness and HA-reports this was related to increasing dose of uptravi, tried 600 mcg however with increased HA and dizziness was advised by specialty nurse to go back to previous dose of 400 and she it tolerating this dose much better.   Advised to monitor b/p x 1 week and return call with reading in one week,   Pt agreeable and voiced understanding

## 2018-11-22 ENCOUNTER — Encounter (HOSPITAL_COMMUNITY): Payer: Self-pay | Admitting: Emergency Medicine

## 2018-11-22 ENCOUNTER — Inpatient Hospital Stay (HOSPITAL_COMMUNITY)
Admission: EM | Admit: 2018-11-22 | Discharge: 2018-12-01 | DRG: 492 | Disposition: A | Discharge status: Home Health | Payer: Medicare Other | Attending: Family Medicine | Admitting: Family Medicine

## 2018-11-22 ENCOUNTER — Other Ambulatory Visit: Payer: Self-pay

## 2018-11-22 ENCOUNTER — Ambulatory Visit (INDEPENDENT_AMBULATORY_CARE_PROVIDER_SITE_OTHER): Payer: Self-pay | Admitting: Physician Assistant

## 2018-11-22 ENCOUNTER — Emergency Department (HOSPITAL_COMMUNITY): Payer: Medicare Other

## 2018-11-22 DIAGNOSIS — J441 Chronic obstructive pulmonary disease with (acute) exacerbation: Secondary | ICD-10-CM | POA: Diagnosis not present

## 2018-11-22 DIAGNOSIS — E1165 Type 2 diabetes mellitus with hyperglycemia: Secondary | ICD-10-CM | POA: Diagnosis not present

## 2018-11-22 DIAGNOSIS — I959 Hypotension, unspecified: Secondary | ICD-10-CM | POA: Diagnosis not present

## 2018-11-22 DIAGNOSIS — S82831A Other fracture of upper and lower end of right fibula, initial encounter for closed fracture: Secondary | ICD-10-CM | POA: Diagnosis not present

## 2018-11-22 DIAGNOSIS — I2723 Pulmonary hypertension due to lung diseases and hypoxia: Secondary | ICD-10-CM | POA: Diagnosis present

## 2018-11-22 DIAGNOSIS — S82891D Other fracture of right lower leg, subsequent encounter for closed fracture with routine healing: Secondary | ICD-10-CM | POA: Diagnosis not present

## 2018-11-22 DIAGNOSIS — I130 Hypertensive heart and chronic kidney disease with heart failure and stage 1 through stage 4 chronic kidney disease, or unspecified chronic kidney disease: Secondary | ICD-10-CM | POA: Diagnosis present

## 2018-11-22 DIAGNOSIS — R0602 Shortness of breath: Secondary | ICD-10-CM | POA: Diagnosis not present

## 2018-11-22 DIAGNOSIS — J8410 Pulmonary fibrosis, unspecified: Secondary | ICD-10-CM | POA: Diagnosis present

## 2018-11-22 DIAGNOSIS — R0902 Hypoxemia: Secondary | ICD-10-CM | POA: Diagnosis not present

## 2018-11-22 DIAGNOSIS — J95822 Acute and chronic postprocedural respiratory failure: Secondary | ICD-10-CM | POA: Diagnosis not present

## 2018-11-22 DIAGNOSIS — S82391A Other fracture of lower end of right tibia, initial encounter for closed fracture: Secondary | ICD-10-CM

## 2018-11-22 DIAGNOSIS — J9621 Acute and chronic respiratory failure with hypoxia: Secondary | ICD-10-CM | POA: Diagnosis not present

## 2018-11-22 DIAGNOSIS — D631 Anemia in chronic kidney disease: Secondary | ICD-10-CM | POA: Diagnosis present

## 2018-11-22 DIAGNOSIS — J439 Emphysema, unspecified: Secondary | ICD-10-CM | POA: Diagnosis present

## 2018-11-22 DIAGNOSIS — I10 Essential (primary) hypertension: Secondary | ICD-10-CM | POA: Diagnosis present

## 2018-11-22 DIAGNOSIS — W1839XA Other fall on same level, initial encounter: Secondary | ICD-10-CM | POA: Diagnosis present

## 2018-11-22 DIAGNOSIS — E118 Type 2 diabetes mellitus with unspecified complications: Secondary | ICD-10-CM

## 2018-11-22 DIAGNOSIS — E46 Unspecified protein-calorie malnutrition: Secondary | ICD-10-CM | POA: Diagnosis present

## 2018-11-22 DIAGNOSIS — S8251XA Displaced fracture of medial malleolus of right tibia, initial encounter for closed fracture: Secondary | ICD-10-CM

## 2018-11-22 DIAGNOSIS — S93431A Sprain of tibiofibular ligament of right ankle, initial encounter: Secondary | ICD-10-CM

## 2018-11-22 DIAGNOSIS — D649 Anemia, unspecified: Secondary | ICD-10-CM

## 2018-11-22 DIAGNOSIS — I2781 Cor pulmonale (chronic): Secondary | ICD-10-CM | POA: Diagnosis present

## 2018-11-22 DIAGNOSIS — J431 Panlobular emphysema: Secondary | ICD-10-CM | POA: Diagnosis not present

## 2018-11-22 DIAGNOSIS — Z9221 Personal history of antineoplastic chemotherapy: Secondary | ICD-10-CM

## 2018-11-22 DIAGNOSIS — I5033 Acute on chronic diastolic (congestive) heart failure: Secondary | ICD-10-CM | POA: Diagnosis not present

## 2018-11-22 DIAGNOSIS — R339 Retention of urine, unspecified: Secondary | ICD-10-CM | POA: Diagnosis not present

## 2018-11-22 DIAGNOSIS — Z7982 Long term (current) use of aspirin: Secondary | ICD-10-CM

## 2018-11-22 DIAGNOSIS — Z902 Acquired absence of lung [part of]: Secondary | ICD-10-CM

## 2018-11-22 DIAGNOSIS — Z9981 Dependence on supplemental oxygen: Secondary | ICD-10-CM | POA: Diagnosis not present

## 2018-11-22 DIAGNOSIS — Z7951 Long term (current) use of inhaled steroids: Secondary | ICD-10-CM

## 2018-11-22 DIAGNOSIS — S9304XA Dislocation of right ankle joint, initial encounter: Secondary | ICD-10-CM

## 2018-11-22 DIAGNOSIS — J449 Chronic obstructive pulmonary disease, unspecified: Secondary | ICD-10-CM | POA: Diagnosis not present

## 2018-11-22 DIAGNOSIS — W19XXXA Unspecified fall, initial encounter: Secondary | ICD-10-CM

## 2018-11-22 DIAGNOSIS — Z1159 Encounter for screening for other viral diseases: Secondary | ICD-10-CM

## 2018-11-22 DIAGNOSIS — Z6833 Body mass index (BMI) 33.0-33.9, adult: Secondary | ICD-10-CM

## 2018-11-22 DIAGNOSIS — I2722 Pulmonary hypertension due to left heart disease: Secondary | ICD-10-CM | POA: Diagnosis present

## 2018-11-22 DIAGNOSIS — I5023 Acute on chronic systolic (congestive) heart failure: Secondary | ICD-10-CM | POA: Diagnosis not present

## 2018-11-22 DIAGNOSIS — I5030 Unspecified diastolic (congestive) heart failure: Secondary | ICD-10-CM | POA: Diagnosis not present

## 2018-11-22 DIAGNOSIS — J9622 Acute and chronic respiratory failure with hypercapnia: Secondary | ICD-10-CM | POA: Diagnosis not present

## 2018-11-22 DIAGNOSIS — T85848A Pain due to other internal prosthetic devices, implants and grafts, initial encounter: Secondary | ICD-10-CM | POA: Diagnosis not present

## 2018-11-22 DIAGNOSIS — E11649 Type 2 diabetes mellitus with hypoglycemia without coma: Secondary | ICD-10-CM | POA: Diagnosis present

## 2018-11-22 DIAGNOSIS — D62 Acute posthemorrhagic anemia: Secondary | ICD-10-CM | POA: Diagnosis not present

## 2018-11-22 DIAGNOSIS — Z85118 Personal history of other malignant neoplasm of bronchus and lung: Secondary | ICD-10-CM

## 2018-11-22 DIAGNOSIS — S82851A Displaced trimalleolar fracture of right lower leg, initial encounter for closed fracture: Principal | ICD-10-CM | POA: Diagnosis present

## 2018-11-22 DIAGNOSIS — J302 Other seasonal allergic rhinitis: Secondary | ICD-10-CM | POA: Diagnosis present

## 2018-11-22 DIAGNOSIS — M14671 Charcot's joint, right ankle and foot: Secondary | ICD-10-CM | POA: Diagnosis not present

## 2018-11-22 DIAGNOSIS — N183 Chronic kidney disease, stage 3 (moderate): Secondary | ICD-10-CM | POA: Diagnosis present

## 2018-11-22 DIAGNOSIS — E1122 Type 2 diabetes mellitus with diabetic chronic kidney disease: Secondary | ICD-10-CM | POA: Diagnosis present

## 2018-11-22 DIAGNOSIS — M25571 Pain in right ankle and joints of right foot: Secondary | ICD-10-CM | POA: Diagnosis present

## 2018-11-22 DIAGNOSIS — Z79891 Long term (current) use of opiate analgesic: Secondary | ICD-10-CM

## 2018-11-22 DIAGNOSIS — Z794 Long term (current) use of insulin: Secondary | ICD-10-CM | POA: Diagnosis not present

## 2018-11-22 DIAGNOSIS — Y92009 Unspecified place in unspecified non-institutional (private) residence as the place of occurrence of the external cause: Secondary | ICD-10-CM

## 2018-11-22 DIAGNOSIS — I2729 Other secondary pulmonary hypertension: Secondary | ICD-10-CM | POA: Diagnosis not present

## 2018-11-22 DIAGNOSIS — S82891A Other fracture of right lower leg, initial encounter for closed fracture: Secondary | ICD-10-CM | POA: Diagnosis not present

## 2018-11-22 DIAGNOSIS — E119 Type 2 diabetes mellitus without complications: Secondary | ICD-10-CM | POA: Diagnosis not present

## 2018-11-22 DIAGNOSIS — Z87891 Personal history of nicotine dependence: Secondary | ICD-10-CM

## 2018-11-22 DIAGNOSIS — E876 Hypokalemia: Secondary | ICD-10-CM | POA: Diagnosis not present

## 2018-11-22 DIAGNOSIS — S82851S Displaced trimalleolar fracture of right lower leg, sequela: Secondary | ICD-10-CM | POA: Diagnosis not present

## 2018-11-22 DIAGNOSIS — I452 Bifascicular block: Secondary | ICD-10-CM | POA: Diagnosis present

## 2018-11-22 DIAGNOSIS — E669 Obesity, unspecified: Secondary | ICD-10-CM | POA: Diagnosis not present

## 2018-11-22 DIAGNOSIS — R7309 Other abnormal glucose: Secondary | ICD-10-CM | POA: Diagnosis not present

## 2018-11-22 DIAGNOSIS — J9690 Respiratory failure, unspecified, unspecified whether with hypoxia or hypercapnia: Secondary | ICD-10-CM

## 2018-11-22 DIAGNOSIS — I9589 Other hypotension: Secondary | ICD-10-CM | POA: Diagnosis present

## 2018-11-22 DIAGNOSIS — I50810 Right heart failure, unspecified: Secondary | ICD-10-CM | POA: Diagnosis present

## 2018-11-22 DIAGNOSIS — E1169 Type 2 diabetes mellitus with other specified complication: Secondary | ICD-10-CM | POA: Diagnosis not present

## 2018-11-22 DIAGNOSIS — I272 Pulmonary hypertension, unspecified: Secondary | ICD-10-CM | POA: Diagnosis present

## 2018-11-22 DIAGNOSIS — Z8781 Personal history of (healed) traumatic fracture: Secondary | ICD-10-CM

## 2018-11-22 DIAGNOSIS — R0609 Other forms of dyspnea: Secondary | ICD-10-CM | POA: Diagnosis not present

## 2018-11-22 DIAGNOSIS — I9581 Postprocedural hypotension: Secondary | ICD-10-CM | POA: Diagnosis not present

## 2018-11-22 DIAGNOSIS — S82851D Displaced trimalleolar fracture of right lower leg, subsequent encounter for closed fracture with routine healing: Secondary | ICD-10-CM | POA: Diagnosis not present

## 2018-11-22 DIAGNOSIS — I5032 Chronic diastolic (congestive) heart failure: Secondary | ICD-10-CM | POA: Diagnosis present

## 2018-11-22 DIAGNOSIS — I2510 Atherosclerotic heart disease of native coronary artery without angina pectoris: Secondary | ICD-10-CM | POA: Diagnosis present

## 2018-11-22 DIAGNOSIS — E785 Hyperlipidemia, unspecified: Secondary | ICD-10-CM | POA: Diagnosis present

## 2018-11-22 DIAGNOSIS — Z79899 Other long term (current) drug therapy: Secondary | ICD-10-CM

## 2018-11-22 DIAGNOSIS — R0989 Other specified symptoms and signs involving the circulatory and respiratory systems: Secondary | ICD-10-CM | POA: Diagnosis not present

## 2018-11-22 DIAGNOSIS — Z880 Allergy status to penicillin: Secondary | ICD-10-CM

## 2018-11-22 DIAGNOSIS — S82899A Other fracture of unspecified lower leg, initial encounter for closed fracture: Secondary | ICD-10-CM | POA: Diagnosis present

## 2018-11-22 LAB — BASIC METABOLIC PANEL
Anion gap: 9 (ref 5–15)
BUN: 29 mg/dL — ABNORMAL HIGH (ref 8–23)
CO2: 32 mmol/L (ref 22–32)
Calcium: 9.3 mg/dL (ref 8.9–10.3)
Chloride: 99 mmol/L (ref 98–111)
Creatinine, Ser: 1.43 mg/dL — ABNORMAL HIGH (ref 0.44–1.00)
GFR calc Af Amer: 43 mL/min — ABNORMAL LOW (ref >60)
GFR calc non Af Amer: 37 mL/min — ABNORMAL LOW (ref >60)
Glucose, Bld: 68 mg/dL — ABNORMAL LOW (ref 70–99)
Potassium: 3.4 mmol/L — ABNORMAL LOW (ref 3.5–5.1)
Sodium: 140 mmol/L (ref 135–145)

## 2018-11-22 LAB — CBC WITH DIFFERENTIAL/PLATELET
Abs Immature Granulocytes: 0.03 10*3/uL (ref 0.00–0.07)
Basophils Absolute: 0 10*3/uL (ref 0.0–0.1)
Basophils Relative: 0 %
Eosinophils Absolute: 0.2 10*3/uL (ref 0.0–0.5)
Eosinophils Relative: 3 %
HCT: 28 % — ABNORMAL LOW (ref 36.0–46.0)
Hemoglobin: 8.2 g/dL — ABNORMAL LOW (ref 12.0–15.0)
Immature Granulocytes: 0 %
Lymphocytes Relative: 12 %
Lymphs Abs: 0.9 10*3/uL (ref 0.7–4.0)
MCH: 23.2 pg — ABNORMAL LOW (ref 26.0–34.0)
MCHC: 29.3 g/dL — ABNORMAL LOW (ref 30.0–36.0)
MCV: 79.3 fL — ABNORMAL LOW (ref 80.0–100.0)
Monocytes Absolute: 0.9 10*3/uL (ref 0.1–1.0)
Monocytes Relative: 12 %
Neutro Abs: 5.3 10*3/uL (ref 1.7–7.7)
Neutrophils Relative %: 73 %
Platelets: 205 10*3/uL (ref 150–400)
RBC: 3.53 MIL/uL — ABNORMAL LOW (ref 3.87–5.11)
RDW: 17.6 % — ABNORMAL HIGH (ref 11.5–15.5)
WBC: 7.3 10*3/uL (ref 4.0–10.5)
nRBC: 0 % (ref 0.0–0.2)

## 2018-11-22 LAB — SARS CORONAVIRUS 2 BY RT PCR (HOSPITAL ORDER, PERFORMED IN ~~LOC~~ HOSPITAL LAB): SARS Coronavirus 2: NEGATIVE

## 2018-11-22 LAB — CBG MONITORING, ED: Glucose-Capillary: 83 mg/dL (ref 70–99)

## 2018-11-22 MED ORDER — ONDANSETRON HCL 4 MG PO TABS: 4.0000 mg | ORAL_TABLET | Freq: Four times a day (QID) | ORAL | Status: DC | PRN

## 2018-11-22 MED ORDER — ACETAMINOPHEN 325 MG PO TABS: 650.0000 mg | ORAL_TABLET | Freq: Four times a day (QID) | ORAL | Status: DC | PRN

## 2018-11-22 MED ORDER — ONDANSETRON HCL 4 MG/2ML IJ SOLN
4.0000 mg | Freq: Four times a day (QID) | INTRAMUSCULAR | Status: DC | PRN
  Administered 2018-11-22 – 2018-11-23 (×2): 4 mg via INTRAVENOUS
  Filled 2018-11-22: qty 2

## 2018-11-22 MED ORDER — MIDAZOLAM HCL 2 MG/2ML IJ SOLN: 2.0000 mg | Freq: Once | INTRAMUSCULAR | Status: DC

## 2018-11-22 MED ORDER — HYDROMORPHONE HCL 1 MG/ML IJ SOLN
1.0000 mg | Freq: Once | INTRAMUSCULAR | Status: AC
  Administered 2018-11-22: 1 mg via INTRAVENOUS
  Filled 2018-11-22: qty 1

## 2018-11-22 MED ORDER — VENLAFAXINE HCL ER 75 MG PO CP24
75.0000 mg | ORAL_CAPSULE | Freq: Every day | ORAL | Status: DC
  Administered 2018-11-23 – 2018-12-01 (×8): 75 mg via ORAL
  Filled 2018-11-22 (×9): qty 1

## 2018-11-22 MED ORDER — SELEXIPAG 200 MCG PO TABS: 400.0000 ug | ORAL_TABLET | Freq: Two times a day (BID) | ORAL | Status: DC

## 2018-11-22 MED ORDER — ACETAMINOPHEN 650 MG RE SUPP
650.0000 mg | Freq: Four times a day (QID) | RECTAL | Status: DC | PRN
  Administered 2018-11-25: 03:00:00 650 mg via RECTAL
  Filled 2018-11-22: qty 1

## 2018-11-22 MED ORDER — MACITENTAN 10 MG PO TABS
10.0000 mg | ORAL_TABLET | Freq: Every day | ORAL | Status: DC
  Administered 2018-11-23: 10 mg via ORAL
  Filled 2018-11-22: qty 1

## 2018-11-22 MED ORDER — INSULIN ASPART 100 UNIT/ML ~~LOC~~ SOLN
0.0000 [IU] | Freq: Three times a day (TID) | SUBCUTANEOUS | Status: DC
  Administered 2018-11-24 (×3): 1 [IU] via SUBCUTANEOUS
  Administered 2018-11-25: 17:00:00 3 [IU] via SUBCUTANEOUS
  Administered 2018-11-25 (×2): 1 [IU] via SUBCUTANEOUS
  Administered 2018-11-26: 12:00:00 2 [IU] via SUBCUTANEOUS
  Administered 2018-11-26: 1 [IU] via SUBCUTANEOUS
  Administered 2018-11-26 – 2018-11-27 (×2): 2 [IU] via SUBCUTANEOUS
  Administered 2018-11-27: 12:00:00 3 [IU] via SUBCUTANEOUS
  Administered 2018-11-27: 2 [IU] via SUBCUTANEOUS
  Administered 2018-11-28: 7 [IU] via SUBCUTANEOUS
  Administered 2018-11-28: 3 [IU] via SUBCUTANEOUS
  Administered 2018-11-28: 1 [IU] via SUBCUTANEOUS
  Administered 2018-11-29 (×3): 2 [IU] via SUBCUTANEOUS
  Administered 2018-11-30 (×2): 1 [IU] via SUBCUTANEOUS
  Administered 2018-11-30 (×2): 2 [IU] via SUBCUTANEOUS
  Administered 2018-12-01: 3 [IU] via SUBCUTANEOUS
  Administered 2018-12-01: 2 [IU] via SUBCUTANEOUS

## 2018-11-22 MED ORDER — MORPHINE SULFATE (PF) 2 MG/ML IV SOLN
1.0000 mg | INTRAVENOUS | Status: DC | PRN
  Administered 2018-11-22: 1 mg via INTRAVENOUS
  Filled 2018-11-22: qty 1

## 2018-11-22 MED ORDER — ATORVASTATIN CALCIUM 40 MG PO TABS
40.0000 mg | ORAL_TABLET | Freq: Every day | ORAL | Status: DC
  Administered 2018-11-23 – 2018-12-01 (×9): 40 mg via ORAL
  Filled 2018-11-22 (×9): qty 1

## 2018-11-22 MED ORDER — MOMETASONE FURO-FORMOTEROL FUM 200-5 MCG/ACT IN AERO
2.0000 | INHALATION_SPRAY | Freq: Two times a day (BID) | RESPIRATORY_TRACT | Status: DC
  Administered 2018-11-26 – 2018-12-01 (×11): 2 via RESPIRATORY_TRACT
  Filled 2018-11-22 (×3): qty 8.8

## 2018-11-22 MED ORDER — TRAMADOL HCL 50 MG PO TABS
50.0000 mg | ORAL_TABLET | Freq: Three times a day (TID) | ORAL | Status: DC | PRN
  Administered 2018-11-23: 50 mg via ORAL
  Filled 2018-11-22: qty 1

## 2018-11-22 MED ORDER — IPRATROPIUM-ALBUTEROL 0.5-2.5 (3) MG/3ML IN SOLN: 3.0000 mL | RESPIRATORY_TRACT | Status: DC | PRN

## 2018-11-22 MED ORDER — GABAPENTIN 300 MG PO CAPS
300.0000 mg | ORAL_CAPSULE | Freq: Three times a day (TID) | ORAL | Status: DC
  Administered 2018-11-22 – 2018-11-23 (×2): 300 mg via ORAL
  Filled 2018-11-22 (×2): qty 1

## 2018-11-22 NOTE — ED Provider Notes (Addendum)
[facility] DEPT Provider Note   CSN: [id number] Arrival date & time: 11/22/18  1346   History   Chief Complaint Chief Complaint  Patient presents with  . Ankle Injury  . Fall    HPI Amy Shepard is a 73 y.o. female with a hx of CAD, CHF, lung cancer s/p chemo & RLL segementectomy several years ago, COPD, chronic respiratory failure on 4L via [place] at baseline, DM, HTN, hypercholesterolemia, and CKD who presents to the ED s/p fall just PTA w/ complaints of R ankle pain. Patient states she was ambulating with her walker which she uses at baseline when her legs gave out causing her to fall, she states her legs giving out is not a new problem- has happened several times previously. She denies head injury or LOC. States isolated injury to her R ankle, got it stuck on the walker. Tried to get up on her own without success as she is unable to bear weight on the R ankle. EMS called. Current pain is a 5/10 at rest, worsens to 8/10 w/ movement, no alleviating factors, no intervention PTA. States that the top of her R foot feels tingly. Denies headache, vision change, neck pain, back pain, or weakness. Denies prodromal lightheadedness, dizziness, chest pain, or dyspnea- additional denies any of these sxs currently.   HPI  Past Medical History:  Diagnosis Date  . Arthritis   . CAD (coronary artery disease)    a. Prior cath 2015 showed 40% prox AD, 50-50% mLAD, otherwise calcification but no obstruction in LCx/RCA.. Medical management recommended. b. 2017: low-risk NST.  [name] Chronic diastolic CHF (congestive heart failure) ([place])   . Chronic respiratory failure ([place])   . CKD (chronic kidney disease), stage III ([place])   . COPD (chronic obstructive pulmonary disease) ([place])   . Cor pulmonale ([name])    a. felt due to advanced COPD and noncompliance with O2.  . Depression   . Diabetes mellitus    [name]    dx  2008  . Fracture    left foot  . Hx of seasonal allergies    . Hypercholesteremia   . Hypertension    on no medications  . Lung cancer ([place])   . On home oxygen therapy    "2.5L; 24/7" (03/10/2017)  . Pericardial effusion    a. small-moderate in 07/2016.  . Pulmonary hypertension ()   . UTI (urinary tract infection)     Patient Active Problem List   Diagnosis Date Noted  . Pain in joint, ankle and foot 04/10/2018  . Closed left fibular fracture 04/10/2018  . CKD (chronic kidney disease), stage IV ([place]) 04/09/2018  . Near syncope 04/09/2018  . Dyspnea   . Bronchitis, acute 03/11/2018  . Fatigue 03/11/2018  . Anemia due to stage 4 chronic kidney disease ([place]) 03/11/2018  . Pulmonary fibrosis ([place]) 03/11/2018  . Pulmonary HTN ([place]) 03/11/2018  . Anemia 03/11/2018  . Lisfranc dislocation, left, subsequent encounter 06/08/2017  . Chronic respiratory failure with hypoxia ([place]) 06/03/2017  . Falls 06/03/2017  . Hypokalemia 06/03/2017  . Sepsis secondary to UTI ([place]) 06/02/2017  . Dog bite 05/02/2017  . CHF exacerbation ([place]) 05/02/2017  . Acute diastolic CHF (congestive heart failure) ([place]) 05/02/2017  . Chronic diastolic CHF (congestive heart failure) ([place]) 04/02/2017  . Cor pulmonale ([place]) 04/02/2017  . Acute on chronic respiratory failure ([place]) 03/10/2017  . Chronic pain 03/10/2017  . Lactic acidosis 02/10/2017  . Acute respiratory failure with hypoxia ([place]) 02/09/2017  .  Acute on chronic diastolic CHF (congestive heart failure) ([place]) 06/23/2016  . HTN (hypertension) 06/23/2016  . Type 2 diabetes mellitus with complication, with long-term current use of insulin ([place]) 03/11/2016  . Depression 03/11/2016  . Chronic obstructive pulmonary disease ([place]) 03/10/2016  . S/P lumbar spinal fusion 02/22/2016  . Coronary artery disease involving native coronary artery 07/12/2014  . Pain in the chest   . Malignant neoplasm of lower lobe of right lung ([place]) 05/25/2014  . Lung cancer ([place]) 09/01/2012    Past Surgical History:  Procedure  Laterality Date  . [facility] or [facility] in [place]     bilateral cataracts  . LEFT HEART CATHETERIZATION WITH CORONARY ANGIOGRAM N/A 07/12/2014   Procedure: LEFT HEART CATHETERIZATION WITH CORONARY ANGIOGRAM;  Surgeon: [name], MD;  Location: [facility] CATH LAB;  Service: Cardiovascular;  Laterality: N/A;  . MAXIMUM ACCESS (MAS)POSTERIOR LUMBAR INTERBODY FUSION (PLIF) 2 LEVEL N/A 02/22/2016   Procedure: Lumbar three-four - Lumbar four-five  MAXIMUM ACCESS SURGERY  POSTERIOR LUMBAR INTERBODY FUSION;  Surgeon: [name], MD;  Location: [facility] NEURO ORS;  Service: Neurosurgery;  Laterality: N/A;  . ORIF TOE FRACTURE Left 06/12/2017   Procedure: OPEN REDUCTION INTERNAL FIXATION (ORIF) BASE 1ST METATARSAL (TOE) FRACTURE;  Surgeon: Newt Minion, MD;  Location: [facility];  Service: Orthopedics;  Laterality: Left;  . RIGHT HEART CATH N/A 08/06/2017   Procedure: RIGHT HEART CATH;  Surgeon: Larey Dresser, MD;  Location: [facility] CV LAB;  Service: Cardiovascular;  Laterality: N/A;  . THORACOTOMY Right 2010   lower  . TONSILLECTOMY       OB History   No obstetric history on file.      Home Medications    Prior to Admission medications   Medication Sig Start Date End Date Taking? Authorizing Provider  acetaminophen (TYLENOL) 325 MG tablet Take 650 mg by mouth every 6 (six) hours as needed for moderate pain or fever.     [provider]  aspirin EC 81 MG tablet Take 81 mg by mouth daily.     [provider]  atorvastatin (LIPITOR) 40 MG tablet Take 1 tablet (40 mg total) by mouth daily. 04/20/18 07/19/18  Larey Dresser, MD  Cholecalciferol (VITAMIN D) 2000 units CAPS Take 2,000 Units by mouth daily.    [provider]  Fluticasone-Salmeterol (ADVAIR) 250-50 MCG/DOSE AEPB Inhale 1 puff into the lungs 2 (two) times daily as needed (shortness of breath/wheezing).     [provider]  furosemide (LASIX) 40 MG  tablet Take 1 tablet (40 mg total) by mouth 2 (two) times daily. 07/20/18   Larey Dresser, MD  gabapentin (NEURONTIN) 300 MG capsule Take 300 mg by mouth 3 (three) times daily.    [provider]  guaiFENesin (MUCINEX) 600 MG 12 hr tablet Take 1 tablet (600 mg total) by mouth 2 (two) times daily as needed for cough or to loosen phlegm. 05/07/17   Aline August, MD  Insulin Detemir (LEVEMIR FLEXTOUCH) 100 UNIT/ML Pen Inject 36 Units into the skin at bedtime.     [provider]  insulin lispro (HUMALOG) 100 UNIT/ML injection Inject 2-11 Units into the skin See admin instructions. Inject 6 units with each meal and add additional units per sliding scale: BGL 101-150 = 2 units; 151-200 = 3 units; 201-250 = 5 units; 251-300 = 7 units; 301-350 = 9 units; >350 = 11 units;  CALL MD IF BGL IS >400 OR <60    [provider]  ipratropium-albuterol (DUONEB) 0.5-2.5 (3) MG/3ML SOLN Take 3 mLs by nebulization every 4 (four) hours as needed (wheezing, Shortness of breath). 03/13/16   Johnson, Clanford L, MD  macitentan (OPSUMIT) 10 MG tablet Take 1 tablet (10 mg total) by mouth daily. 08/25/18   Larey Dresser, MD  midodrine (PROAMATINE) 5 MG tablet Take 1 tablet (5 mg total) by mouth 3 (three) times daily with meals. 07/22/18   Georgiana Shore, NP  OXYGEN Inhale 3 L into the lungs continuous. FOR COPD    [provider]  potassium chloride SA (K-DUR,KLOR-CON) 20 MEQ tablet Take 2 tablets (40 mEq total) by mouth daily. 06/29/18   Larey Dresser, MD  Selexipag (UPTRAVI) 200 MCG TABS Take 3 tablets (600 mcg total) by mouth 2 (two) times daily. 10/22/18   Larey Dresser, MD  traMADol (ULTRAM) 50 MG tablet Take 1 tablet (50 mg total) by mouth every 8 (eight) hours as needed (for pain). 06/06/17   Danford, Suann Larry, MD  TRULICITY 4.78 GN/5.6OZ SOPN Inject 0.5 mLs as directed every Sunday.  01/28/18   [provider]    Family History Family History  Problem Relation  Age of Onset  . Diabetes Father   . Heart failure Father   . Cancer Sister   . Diabetes Brother   . Heart failure Brother     Social History Social History   Tobacco Use  . Smoking status: Former Smoker    Packs/day: 1.00    Years: 30.00    Pack years: 30.00    Last attempt to quit: 07/14/2006    Years since quitting: 12.3  . Smokeless tobacco: Never Used  Substance Use Topics  . Alcohol use: No  . Drug use: No     Allergies   Amoxicillin   Review of Systems Review of Systems  Constitutional: Negative for chills and fever.  Eyes: Negative for visual disturbance.  Respiratory: Negative for shortness of breath.   Cardiovascular: Negative for chest pain.  Gastrointestinal: Negative for abdominal pain and vomiting.  Musculoskeletal: Positive for arthralgias and joint swelling. Negative for back pain and neck pain.  Neurological: Positive for numbness. Negative for dizziness, tremors, syncope, speech difficulty, weakness, light-headedness and headaches.  All other systems reviewed and are negative.   Physical Exam Updated Vital Signs BP 109/63 (BP Location: Left Arm)   Pulse 72   Temp 98.2 F (36.8 C) (Oral)   Resp 18   SpO2 100%   Physical Exam Vitals signs and nursing note reviewed.  Constitutional:      General: She is not in acute distress.    Appearance: She is well-developed.  HENT:     Head: Normocephalic and atraumatic. No raccoon eyes or Battle's sign.     Right Ear: No hemotympanum.     Left Ear: No hemotympanum.  Eyes:     General:        Right eye: No discharge.        Left eye: No discharge.     Conjunctiva/sclera: Conjunctivae normal.     Pupils: Pupils are equal, round, and reactive to light.  Neck:     Musculoskeletal: Normal range of motion. No spinous process tenderness or muscular tenderness.  Cardiovascular:     Rate and Rhythm: Normal rate and regular rhythm.     Pulses:          Dorsalis pedis pulses are  2+ on the right side and 2+  on the left side.     Heart sounds: No murmur.  Pulmonary:     Effort: No respiratory distress.     Breath sounds: Normal breath sounds. No wheezing or rales.     Comments: On baseline 4L via [place] Chest:     Chest wall: No tenderness.  Abdominal:     General: There is no distension.     Palpations: Abdomen is soft.     Tenderness: There is no abdominal tenderness.  Musculoskeletal:     Comments: UEs: Normal AROM. Nontender.  Back: No midline spinal tenderness to palpation.  LEs: mild deformity w/ swelling noted to the R ankle. There is some mild bruising medially. No open wounds. Intact to all joints with the exception of decreased ROM of the R ankle- able to plantar/dorsiflexion somewhat. Tender to palpation primarily to medial ankle including medial malleolus, the calcaneus more so medially, mild lateral malleolar tenderness. Tender to distal 1/3rd of the right lower leg. LEs are otherwise nontender. No tenderness to the base of the 5th, navicular bone, or fibular head specifically. NVI distally.   Skin:    General: Skin is warm and dry.     Findings: No rash.  Neurological:     Comments: Alert. Clear speech. PERRL. EOMI. Sensation grossly intact to bilateral UE/LEs, patient reported decreased sensation to the dorsum of the R foot. Symmetric grip strength. Able to plantar/dorsiflexion against resistance bilaterally.   Psychiatric:        Behavior: Behavior normal.    ED Treatments / Results  Labs (all labs ordered are listed, but only abnormal results are displayed) Labs Reviewed  BASIC METABOLIC PANEL - Abnormal; Notable for the following components:      Result Value   Potassium 3.4 (*)    Glucose, Bld 68 (*)    BUN 29 (*)    Creatinine, Ser 1.43 (*)    GFR calc non Af Amer 37 (*)    GFR calc Af Amer 43 (*)    All other components within normal limits  CBC WITH DIFFERENTIAL/PLATELET - Abnormal; Notable for the following components:   RBC 3.53 (*)    Hemoglobin 8.2 (*)     HCT 28.0 (*)    MCV 79.3 (*)    MCH 23.2 (*)    MCHC 29.3 (*)    RDW 17.6 (*)    All other components within normal limits    EKG None  Radiology Dg Ankle 2 Views Right  Result Date: 11/22/2018 CLINICAL DATA:  Status post reduction for fracture/dislocation EXAM: RIGHT ANKLE - 2 VIEW COMPARISON:  Nov 22, 2018 study obtained earlier in the day FINDINGS: Frontal and lateral views obtained. Images obtained in plaster. There is a fracture the distal fibular diaphysis with mild posterior angulation distally. There is approximately 8 mm of overriding of fracture fragments. There is avulsion of the medial malleolus. Compared to study obtained earlier in the day, there is less ankle mortise disruption. The talus is no longer displaced entirely posterior to the tibial plafond. However, there does remain mild lateral and posterior displacement of the talus with respect to the plafond and. 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 IMPRESSION: There is a persistent degree of lateral and posterior displacement of the talus with respect to the tibial plafond. The gross posterior displacement of the talus with respect to the tibial plafond seen prior to reduction is no longer evident. There are again noted fractures  of the distal fibula and medial malleolar regions. No new fractures are demonstrated. There may be slightly less displacement of the distal medial malleolar fracture fragment compared to the more proximal fragment in comparison with study obtained earlier in the day. Electronically Signed   By: Lowella Grip III M.D.   On: 11/22/2018 16:11   Dg Ankle Complete Right  Result Date: 11/22/2018 CLINICAL DATA:  Right ankle pain after fall. EXAM: RIGHT ANKLE - COMPLETE 3+ VIEW COMPARISON:  None. FINDINGS: Severely angulated and comminuted fracture of right distal fibula is noted. There is posterior dislocation of the talus relative to the tibia. Probable moderately displaced posterior  malleolar fracture is noted. Moderately displaced medial malleolar fracture is noted as well. IMPRESSION: Posterior dislocation of talus relative to distal tibia with associated fractures of the distal right fibula, medial malleolus and posterior malleolus. Electronically Signed   By: Marijo Conception M.D.   On: 11/22/2018 14:48   Procedures Procedures (including critical care time)  SPLINT APPLICATION Date/Time: 78/46/96 Authorized by: Kennith Maes Consent: Verbal consent obtained. Risks and benefits: risks, benefits and alternatives were discussed Consent given by: patient Splint applied by: orthopedic technician Location details:  Splint type: short leg splint Post-procedure: The splinted body part was neurovascularly unchanged following the procedure. Patient tolerance: Patient tolerated the procedure well with no immediate complications.  Medications Ordered in ED Medications - No data to display  Initial Impression / Assessment and Plan / ED Course  I have reviewed the triage vital signs and the nursing notes.  Pertinent labs & imaging results that were available during my care of the patient were reviewed by me and considered in my medical decision making (see chart for details).   Patient presents to the emergency department status post mechanical fall with isolated right ankle injury.  Nontoxic-appearing, no apparent distress, initial vitals WNL.  No evidence of serious head, neck, or back injury.  No focal neurologic deficits or midline spinal tenderness. No chest or abdominal tenderness. Mild deformity w/ swelling, decreased ROM, and tenderness to the R ankle & distal 1/3rd of the tib/fib, 2+ DP pulses, mild subjective decreased sensation to the dorsum of the foot but grossly intact, good strength, moving the digits. Plan for x-ray.   Initial x-ray: Posterior dislocation of talus relative to distal tibia with associated fractures of the distal right fibula, medial  malleolus and posterior malleolus.  Reduction per supervising physician Dr. Regenia Skeeter who has discussed w/ orthopedic surgeon Dr. Louanne Skye who has reviewed initial & repeat imaging. Requesting medical admission due to comorbidities. Per Dr. Otho Ket note- Admit to [facility]. Dr. Louanne Skye will see her tonight and plan for surgery by Dr. Sharol Given tomorrow.   Labs reviewed:  CBC: anemia somewhat similar to prior ranges.  BMP: Renal function similar to prior as well. Hypoglycemia- will give something to eat/drink, patient mentating appropriately.   17:28: CONSULT: Discussed w/ hospitalist Dr. Cathlean Sauer- accepts admission.   This is a shared visit with supervising physician Dr. Regenia Skeeter who has independently evaluated patient & provided guidance in evaluation/management/disposition, in agreement with care   Amy Shepard was evaluated in Emergency Department on 11/22/2018 for the symptoms described in the history of present illness. He/she was evaluated in the context of the global COVID-19 pandemic, which necessitated consideration that the patient might be at risk for infection with the SARS-CoV-2 virus that causes COVID-19. Institutional protocols and algorithms that pertain to the evaluation of patients at risk for COVID-19 are in a state of  rapid change based on information released by regulatory bodies including the CDC and federal and state organizations. These policies and algorithms were followed during the patient's care in the ED.  Final Clinical Impressions(s) / ED Diagnoses   Final diagnoses:  Fall, initial encounter  Closed dislocation of right talus, initial encounter  Closed fracture of distal end of right fibula, unspecified fracture morphology, initial encounter  Closed displaced fracture of medial malleolus of right tibia, initial encounter  Closed fracture of posterior malleolus of right tibia, initial encounter    ED Discharge Orders    None       Amaryllis Dyke, PA-C  11/22/18 [address] [place], PA-C 11/22/18 1837    Sherwood Gambler, MD 11/25/18 1625

## 2018-11-22 NOTE — H&P (Addendum)
History and Physical    Amy Shepard UMP:[id number] DOB: 01/06/1946 DOA: 11/22/2018  PCP: Maurice Small, MD   Patient coming from: Home   Chief Complaint: Right ankle pain.   HPI: Amy Shepard is a 73 y.o. female with medical history significant of ambulatory dysfunction, she uses a walker, arthritis, coronary artery disease, diastolic heart failure, chronic kidney disease stage III, COPD, type 2 diabetes mellitus, hypertension, chronic hypoxic respiratory failure.  She presented with significant right ankle pain after sustaining a mechanical fall.  She was standing, holding on her walker when she suddenly felt weak in her lower extremities, she was unable to hold herself and fell twisting her right ankle.  No head trauma.  The pain was severe in intensity, sharp in nature, worse with movement, no improving factors, no radiation.  Patient has been isolating herself since early March, no sick contacts, denies any out of the ordinary events.  She has experienced lower extremity weakness before.  ED Course: Patient was diagnosed with a right ankle fracture dislocation, it was reduced under conscious sedation and splinted.  She was seen by orthopedics with recommendations to transfer to [facility] for surgical intervention.  Review of Systems:  1. General: No fevers, no chills, no weight gain or weight loss 2. ENT: No runny nose or sore throat, no hearing disturbances 3. Pulmonary: No dyspnea, cough, wheezing, or hemoptysis 4. Cardiovascular: No angina, claudication, lower extremity edema, pnd or orthopnea 5. Gastrointestinal: No nausea or vomiting, no diarrhea or constipation 6. Hematology: No easy bruisability or frequent infections 7. Urology: No dysuria, hematuria or increased urinary frequency 8. Dermatology: No rashes. 9. Neurology: No seizures or paresthesias 10. Musculoskeletal: No joint pain or deformities/ she uses a walker for ambulation.   Past Medical  History:  Diagnosis Date   Arthritis    CAD (coronary artery disease)    a. Prior cath 2015 showed 40% prox AD, 50-50% mLAD, otherwise calcification but no obstruction in LCx/RCA.. Medical management recommended. b. 2017: low-risk NST.   Chronic diastolic CHF (congestive heart failure) ([name])    Chronic respiratory failure ([name])    CKD (chronic kidney disease), stage III ([name])    COPD (chronic obstructive pulmonary disease) ([name])    Cor pulmonale ([name])    a. felt due to advanced COPD and noncompliance with O2.   Depression    Diabetes mellitus    januvia    dx  2008   Fracture    left foot   Hx of seasonal allergies    Hypercholesteremia    Hypertension    on no medications   Lung cancer ([place])    On home oxygen therapy    "2.5L; 24/7" (03/10/2017)   Pericardial effusion    a. small-moderate in 07/2016.   Pulmonary hypertension ([place])    UTI (urinary tract infection)     Past Surgical History:  Procedure Laterality Date   CARDIAC CATHETERIZATION     1995 or 11  [facility] in [place]     bilateral cataracts   LEFT HEART CATHETERIZATION WITH CORONARY ANGIOGRAM N/A 07/12/2014   Procedure: LEFT HEART CATHETERIZATION WITH CORONARY ANGIOGRAM;  Surgeon: [name], MD;  Location: [facility] CATH LAB;  Service: Cardiovascular;  Laterality: N/A;   MAXIMUM ACCESS (MAS)POSTERIOR LUMBAR INTERBODY FUSION (PLIF) 2 LEVEL N/A 02/22/2016   Procedure: Lumbar three-four - Lumbar four-five  MAXIMUM ACCESS SURGERY  POSTERIOR LUMBAR INTERBODY FUSION;  Surgeon: [name], MD;  Location: [place] NEURO ORS;  Service: Neurosurgery;  Laterality: N/A;   ORIF TOE FRACTURE Left 06/12/2017   Procedure: OPEN REDUCTION INTERNAL FIXATION (ORIF) BASE 1ST METATARSAL (TOE) FRACTURE;  Surgeon: Newt Minion, MD;  Location: [place];  Service: Orthopedics;  Laterality: Left;   RIGHT HEART CATH N/A 08/06/2017   Procedure: RIGHT HEART CATH;  Surgeon: Larey Dresser, MD;  Location:  [place] CV LAB;  Service: Cardiovascular;  Laterality: N/A;   THORACOTOMY Right 2010   lower   TONSILLECTOMY       reports that she quit smoking about 12 years ago. She has a 30.00 pack-year smoking history. She has never used smokeless tobacco. She reports that she does not drink alcohol or use drugs.  Allergies  Allergen Reactions   Amoxicillin Anaphylaxis, Hives, Rash and Other (See Comments)    Has patient had a PCN reaction causing immediate rash, facial/tongue/throat swelling, SOB or lightheadedness with hypotension: YES Positive reaction causing SEVERE RASH INVOLVING MUCUS MEMBRANES/SKIN NECROSIS: YES Reaction that required HOSPITALIZATION: YES Reaction occurring within the last 10 years: NO    Family History  Problem Relation Age of Onset   Diabetes Father    Heart failure Father    Cancer Sister    Diabetes Brother    Heart failure Brother      Prior to Admission medications   Medication Sig Start Date End Date Taking? Authorizing Provider  atorvastatin (LIPITOR) 40 MG tablet Take 40 mg by mouth daily.  10/26/18  Yes [provider]  furosemide (LASIX) 40 MG tablet Take 1 tablet (40 mg total) by mouth 2 (two) times daily. 07/20/18  Yes Larey Dresser, MD  gabapentin (NEURONTIN) 300 MG capsule Take 300 mg by mouth 3 (three) times daily.   Yes [provider]  Insulin Detemir (LEVEMIR FLEXTOUCH) 100 UNIT/ML Pen Inject 36 Units into the skin at bedtime.    Yes [provider]  insulin lispro (HUMALOG) 100 UNIT/ML injection Inject 2-11 Units into the skin See admin instructions. Inject 6 units with each meal and add additional units per sliding scale: BGL 101-150 = 2 units; 151-200 = 3 units; 201-250 = 5 units; 251-300 = 7 units; 301-350 = 9 units; >350 = 11 units; CALL MD IF BGL IS >400 OR <60   Yes [provider]  macitentan (OPSUMIT) 10 MG tablet Take 1 tablet (10 mg total) by mouth daily. 08/25/18  Yes Larey Dresser, MD    OXYGEN Inhale 3 L into the lungs continuous. FOR COPD   Yes [provider]  Selexipag (UPTRAVI) 200 MCG TABS Take 3 tablets (600 mcg total) by mouth 2 (two) times daily. Patient taking differently: Take 400 mcg by mouth 2 (two) times a day.  10/22/18  Yes Larey Dresser, MD  venlafaxine XR (EFFEXOR-XR) 75 MG 24 hr capsule Take 75 mg by mouth daily with breakfast.  11/08/18  Yes [provider]  acetaminophen (TYLENOL) 325 MG tablet Take 650 mg by mouth every 6 (six) hours as needed for moderate pain or fever.     [provider]  atorvastatin (LIPITOR) 40 MG tablet Take 1 tablet (40 mg total) by mouth daily. 04/20/18 07/19/18  Larey Dresser, MD  Fluticasone-Salmeterol (ADVAIR) 250-50 MCG/DOSE AEPB Inhale 1 puff into the lungs 2 (two) times daily as needed (shortness of breath/wheezing).     [provider]  guaiFENesin (MUCINEX) 600 MG 12 hr tablet Take 1 tablet (600 mg total) by mouth 2 (two)  times daily as needed for cough or to loosen phlegm. Patient not taking: Reported on 11/22/2018 05/07/17   Aline August, MD  ipratropium-albuterol (DUONEB) 0.5-2.5 (3) MG/3ML SOLN Take 3 mLs by nebulization every 4 (four) hours as needed (wheezing, Shortness of breath). 03/13/16   Johnson, Clanford L, MD  midodrine (PROAMATINE) 5 MG tablet Take 1 tablet (5 mg total) by mouth 3 (three) times daily with meals. Patient not taking: Reported on 11/22/2018 07/22/18   Georgiana Shore, NP  potassium chloride SA (K-DUR,KLOR-CON) 20 MEQ tablet Take 2 tablets (40 mEq total) by mouth daily. Patient taking differently: Take 40 mEq by mouth daily as needed (low potassium).  06/29/18   Larey Dresser, MD  traMADol (ULTRAM) 50 MG tablet Take 1 tablet (50 mg total) by mouth every 8 (eight) hours as needed (for pain). 06/06/17   Danford, Suann Larry, MD  TRULICITY 1.5 QM/2.1IZ SOPN Inject 1.5 mg into the vein once a week.  11/08/18   [provider]    Physical Exam: Vitals:    11/22/18 1530 11/22/18 1600 11/22/18 1630 11/22/18 1700  BP: (!) 137/57 136/74 133/65 135/74  Pulse: 72 69 79 73  Resp: (!) _0 Temp:      TempSrc:      SpO2: 100% 98% 100% 96%    Vitals:   11/22/18 1530 11/22/18 1600 11/22/18 1630 11/22/18 1700  BP: (!) 137/57 136/74 133/65 135/74  Pulse: 72 69 79 73  Resp: (!) _1 Temp:      TempSrc:      SpO2: 100% 98% 100% 96%   General: deconditioned  Neurology: Awake and alert, non focal Head and Neck. Head normocephalic. Neck supple with no adenopathy or thyromegaly.   E ENT: no pallor, no icterus, oral mucosa moist Cardiovascular: No JVD. S1-S2 present, rhythmic, no gallops, rubs, or murmurs. No lower extremity edema. Pulmonary: positive breath sounds bilaterally, no wheezing, rhonchi or rales. Gastrointestinal. Abdomen with no organomegaly, non tender, no rebound or guarding Skin. No rashes Musculoskeletal: no joint deformities/ right leg with cast in place.     Labs on Admission: I have personally reviewed following labs and imaging studies  CBC: Recent Labs  Lab 11/22/18 1615  WBC 7.3  NEUTROABS 5.3  HGB 8.2*  HCT 28.0*  MCV 79.3*  PLT 128   Basic Metabolic Panel: Recent Labs  Lab 11/22/18 1615  NA 140  K 3.4*  CL 99  CO2 32  GLUCOSE 68*  BUN 29*  CREATININE 1.43*  CALCIUM 9.3   GFR: CrCl cannot be calculated (Unknown ideal weight.). Liver Function Tests: No results for input(s): AST, ALT, ALKPHOS, BILITOT, PROT, ALBUMIN in the last 168 hours. No results for input(s): LIPASE, AMYLASE in the last 168 hours. No results for input(s): AMMONIA in the last 168 hours. Coagulation Profile: No results for input(s): INR, PROTIME in the last 168 hours. Cardiac Enzymes: No results for input(s): CKTOTAL, CKMB, CKMBINDEX, TROPONINI in the last 168 hours. BNP (last 3 results) No results for input(s): PROBNP in the last 8760 hours. HbA1C: No results for input(s): HGBA1C in the last 72 hours. CBG: No  results for input(s): GLUCAP in the last 168 hours. Lipid Profile: No results for input(s): CHOL, HDL, LDLCALC, TRIG, CHOLHDL, LDLDIRECT in the last 72 hours. Thyroid Function Tests: No results for input(s): TSH, T4TOTAL, FREET4, T3FREE, THYROIDAB in the last 72 hours. Anemia Panel: No results for input(s): VITAMINB12, FOLATE, FERRITIN, TIBC, IRON, RETICCTPCT in  the last 72 hours. Urine analysis:    Component Value Date/Time   COLORURINE YELLOW 04/09/2018 1910   APPEARANCEUR CLEAR 04/09/2018 1910   LABSPEC 1.009 04/09/2018 1910   PHURINE 5.0 04/09/2018 1910   GLUCOSEU NEGATIVE 04/09/2018 1910   HGBUR NEGATIVE 04/09/2018 1910   BILIRUBINUR NEGATIVE 04/09/2018 1910   KETONESUR NEGATIVE 04/09/2018 1910   PROTEINUR NEGATIVE 04/09/2018 1910   UROBILINOGEN 0.2 02/10/2013 1041   NITRITE NEGATIVE 04/09/2018 1910   LEUKOCYTESUR NEGATIVE 04/09/2018 1910    Radiological Exams on Admission: Dg Ankle 2 Views Right  Result Date: 11/22/2018 CLINICAL DATA:  Status post reduction for fracture/dislocation EXAM: RIGHT ANKLE - 2 VIEW COMPARISON:  Nov 22, 2018 study obtained earlier in the day FINDINGS: Frontal and lateral views obtained. Images obtained in plaster. There is a fracture the distal fibular diaphysis with mild posterior angulation distally. There is approximately 8 mm of overriding of fracture fragments. There is avulsion of the medial malleolus. Compared to study obtained earlier in the day, there is less ankle mortise disruption. The talus is no longer displaced entirely posterior to the tibial plafond. However, there does remain mild lateral and posterior displacement of the talus with respect to the plafond and. 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 IMPRESSION: There is a persistent degree of lateral and posterior displacement of the talus with respect to the tibial plafond. The gross posterior displacement of the talus with respect to the tibial plafond seen prior to  reduction is no longer evident. There are again noted fractures of the distal fibula and medial malleolar regions. No new fractures are demonstrated. There may be slightly less displacement of the distal medial malleolar fracture fragment compared to the more proximal fragment in comparison with study obtained earlier in the day. Electronically Signed   By: Lowella Grip III M.D.   On: 11/22/2018 16:11   Dg Ankle Complete Right  Result Date: 11/22/2018 CLINICAL DATA:  Right ankle pain after fall. EXAM: RIGHT ANKLE - COMPLETE 3+ VIEW COMPARISON:  None. FINDINGS: Severely angulated and comminuted fracture of right distal fibula is noted. There is posterior dislocation of the talus relative to the tibia. Probable moderately displaced posterior malleolar fracture is noted. Moderately displaced medial malleolar fracture is noted as well. IMPRESSION: Posterior dislocation of talus relative to distal tibia with associated fractures of the distal right fibula, medial malleolus and posterior malleolus. Electronically Signed   By: Marijo Conception M.D.   On: 11/22/2018 14:48    EKG: Independently reviewed.  71 bpm, left axis deviation, sinus rhythm with a right bundle branch block, no ST segment changes, chronic T wave inversion in V1 through V4  Assessment/Plan Principal Problem:   Ankle fracture Active Problems:   Chronic obstructive pulmonary disease ([name])   Type 2 diabetes mellitus with complication, with long-term current use of insulin ([name])   HTN (hypertension)   Chronic diastolic CHF (congestive heart failure) ([name])   Cor pulmonale ([name])   Pulmonary HTN ([place])  73 year old female who presented with right ankle pain after suffering a mechanical fall from her own height, no head trauma or loss of consciousness.  Review of systems has been negative, she uses a walker for ambulation and has experienced lower extremity weakness in the past.  On her initial physical examination her blood pressure is  133/65, heart rate 79, respiratory rate 18, oxygen saturation 100% on 4 L per nasal cannula.  Her lungs are clear to auscultation, heart S1-S2 present and rhythmic, abdomen soft,  nontender, no lower extremity edema, right leg with cast in place.  Sodium 140, potassium 3.4, chloride 99, bicarb 32, glucose 68, BUN 29, creatinine 1.43, white count 7.3, hemoglobin 8.2, hematocrit 28.0, platelets 205.  Right ankle films with posterior dislocation of the talus relative to distal tibia with associated fractures of the distal right fibula, medial malleolus and posterior malleolus.   Patient will be admitted to the medical ward with a working diagnosis of right ankle fracture.  1.  Right ankle fracture.  Patient will be transferred to [facility] for surgical intervention in the morning, continue pain control with IV morphine, DVT prophylaxis, and follow orthopedic surgery recommendations.  Patient is medically stable for surgical intervention.  2.  Chronic diastolic heart failure.  Clinically stable with no signs of exacerbation, for now continue blood pressure monitoring, hold on furosemide.  3.  COPD with chronic hypoxic respiratory failure.  No signs of acute exacerbation, continue bronchodilator therapy and supplemental oxygen per nasal cannula.  4.  Type 2 diabetes mellitus with hypoglycemia.  Her admission glucose is down to 68, she is on high doses of basal insulin at home, 36 units at night, for now hold on long-acting insulin, continue glucose monitoring and coverage with insulin sliding scale.  Hold on oral hypoglycemic agents.  5.  Dyslipidemia.  Continue atorvastatin.  6.  Pulmonary hypertension, continue macitentan and selexipag.   7.  Stage III chronic kidney disease.  Her kidney function seems to be at baseline, serum creatinine 1.43, potassium 3.4, bicarbonate 32, follow-up closely kidney function and electrolytes.  For now hold on furosemide.  DVT prophylaxis: scd  Code Status:  full  Family  Communication: no family at the bedside   Disposition Plan: Transfer to [place] called: Orthopedics  Admission status: Inpatient.     Vasiliki Smaldone Gerome Apley MD [facility]   11/22/2018, 5:31 PM

## 2018-11-22 NOTE — ED Notes (Signed)
ED TO INPATIENT HANDOFF REPORT  ED Nurse Name and Phone #: Christinia Gully Name/Age/Gender Amy Shepard 73 y.o. female Room/Bed: WA24/WA24  Code Status   Code Status: Prior  Home/SNF/Other Given to floor Patient oriented to: self, place, time and situation Is this baseline? Yes   Triage Complete: Triage complete  Chief Complaint ankle deformity  Triage Note Per EMS, patient from apartment, reports trip and fall. Deformity to right ankle. Ambulatory with walker at baseline. 4L [place] at baseline.   Allergies Allergies  Allergen Reactions  . Amoxicillin Anaphylaxis, Hives, Rash and Other (See Comments)    Has patient had a PCN reaction causing immediate rash, facial/tongue/throat swelling, SOB or lightheadedness with hypotension: YES Positive reaction causing SEVERE RASH INVOLVING MUCUS MEMBRANES/SKIN NECROSIS: YES Reaction that required HOSPITALIZATION: YES Reaction occurring within the last 10 years: NO    Level of Care/Admitting Diagnosis ED Disposition    ED Disposition Condition [facility] Area: [place] [100100]  Level of Care: Med-Surg [16]  Covid Evaluation: N/A  Diagnosis: Ankle fracture [530051]  Admitting Physician: Tawni Millers [[id number]]  Attending Physician: Tawni Millers [[id number]]  Estimated length of stay: 3 - 4 days  Certification:: I certify this patient will need inpatient services for at least 2 midnights  PT Class (Do Not Modify): Inpatient [101]  PT Acc Code (Do Not Modify): Private [1]       B Medical/Surgery History Past Medical History:  Diagnosis Date  . Arthritis   . CAD (coronary artery disease)    a. Prior cath 2015 showed 40% prox AD, 50-50% mLAD, otherwise calcification but no obstruction in LCx/RCA.. Medical management recommended. b. 2017: low-risk NST.  [name] Chronic diastolic CHF (congestive heart failure) ([place])   . Chronic respiratory failure ([place])   . CKD (chronic  kidney disease), stage III ([place])   . COPD (chronic obstructive pulmonary disease) ([place])   . Cor pulmonale ([name])    a. felt due to advanced COPD and noncompliance with O2.  . Depression   . Diabetes mellitus    [name]    dx  2008  . Fracture    left foot  . Hx of seasonal allergies   . Hypercholesteremia   . Hypertension    on no medications  . Lung cancer ([place])   . On home oxygen therapy    "2.5L; 24/7" (03/10/2017)  . Pericardial effusion    a. small-moderate in 07/2016.  . Pulmonary hypertension ([place])   . UTI (urinary tract infection)    Past Surgical History:  Procedure Laterality Date  . [place] or [facility] in [place]     bilateral cataracts  . LEFT HEART CATHETERIZATION WITH CORONARY ANGIOGRAM N/A 07/12/2014   Procedure: LEFT HEART CATHETERIZATION WITH CORONARY ANGIOGRAM;  Surgeon: [name], MD;  Location: [facility] CATH LAB;  Service: Cardiovascular;  Laterality: N/A;  . MAXIMUM ACCESS (MAS)POSTERIOR LUMBAR INTERBODY FUSION (PLIF) 2 LEVEL N/A 02/22/2016   Procedure: Lumbar three-four - Lumbar four-five  MAXIMUM ACCESS SURGERY  POSTERIOR LUMBAR INTERBODY FUSION;  Surgeon: [name], MD;  Location: [facility]URO ORS;  Service: Neurosurgery;  Laterality: N/A;  . ORIF TOE FRACTURE Left 06/12/2017   Procedure: OPEN REDUCTION INTERNAL FIXATION (ORIF) BASE 1ST METATARSAL (TOE) FRACTURE;  Surgeon: Newt Minion, MD;  Location: [facility];  Service: Orthopedics;  Laterality: Left;  . RIGHT HEART CATH N/A  08/06/2017   Procedure: RIGHT HEART CATH;  Surgeon: Larey Dresser, MD;  Location: [facility] CV LAB;  Service: Cardiovascular;  Laterality: N/A;  . THORACOTOMY Right 2010   lower  . TONSILLECTOMY       A IV Location/Drains/Wounds Patient Lines/Drains/Airways Status   Active Line/Drains/Airways    Name:   Placement date:   Placement time:   Site:   Days:   Peripheral IV 11/22/18 Left Antecubital   11/22/18    1458     Antecubital   less than 1          Intake/Output Last 24 hours No intake or output data in the 24 hours ending 11/22/18 1950  Labs/Imaging Results for orders placed or performed during the hospital encounter of 11/22/18 (from the past 48 hour(s))  Basic metabolic panel     Status: Abnormal   Collection Time: 11/22/18  4:15 PM  Result Value Ref Range   Sodium 140 135 - 145 mmol/L   Potassium 3.4 (L) 3.5 - 5.1 mmol/L   Chloride 99 98 - 111 mmol/L   CO2 32 22 - 32 mmol/L   Glucose, Bld 68 (L) 70 - 99 mg/dL   BUN 29 (H) 8 - 23 mg/dL   Creatinine, Ser 1.43 (H) 0.44 - 1.00 mg/dL   Calcium 9.3 8.9 - 10.3 mg/dL   GFR calc non Af Amer 37 (L) >60 mL/min   GFR calc Af Amer 43 (L) >60 mL/min   Anion gap 9 5 - 15    Comment: Performed at [facility], [place] [address], [place], [place] [address]  CBC with Differential     Status: Abnormal   Collection Time: 11/22/18  4:15 PM  Result Value Ref Range   WBC 7.3 4.0 - 10.5 K/uL   RBC 3.53 (L) 3.87 - 5.11 MIL/uL   Hemoglobin 8.2 (L) 12.0 - 15.0 g/dL   HCT 28.0 (L) 36.0 - 46.0 %   MCV 79.3 (L) 80.0 - 100.0 fL   MCH 23.2 (L) 26.0 - 34.0 pg   MCHC 29.3 (L) 30.0 - 36.0 g/dL   RDW 17.6 (H) 11.5 - 15.5 %   Platelets 205 150 - 400 K/uL   nRBC 0.0 0.0 - 0.2 %   Neutrophils Relative % 73 %   Neutro Abs 5.3 1.7 - 7.7 K/uL   Lymphocytes Relative 12 %   Lymphs Abs 0.9 0.7 - 4.0 K/uL   Monocytes Relative 12 %   Monocytes Absolute 0.9 0.1 - 1.0 K/uL   Eosinophils Relative 3 %   Eosinophils Absolute 0.2 0.0 - 0.5 K/uL   Basophils Relative 0 %   Basophils Absolute 0.0 0.0 - 0.1 K/uL   Immature Granulocytes 0 %   Abs Immature Granulocytes 0.03 0.00 - 0.07 K/uL    Comment: Performed at [facility], [place] [address], [place], [place] [address]  SARS Coronavirus 2 (CEPHEID - Performed in [place] hospital lab), Hosp Order     Status: None   Collection Time: 11/22/18  5:34 PM  Result Value Ref Range   SARS  Coronavirus 2 NEGATIVE NEGATIVE    Comment: (NOTE) If result is NEGATIVE SARS-CoV-2 target nucleic acids are NOT DETECTED. The SARS-CoV-2 RNA is generally detectable in upper and lower  respiratory specimens during the acute phase of infection. The lowest  concentration of SARS-CoV-2 viral copies this assay can detect is 250  copies / mL. A negative result does not preclude SARS-CoV-2 infection  and  should not be used as the sole basis for treatment or other  patient management decisions.  A negative result may occur with  improper specimen collection / handling, submission of specimen other  than nasopharyngeal swab, presence of viral mutation(s) within the  areas targeted by this assay, and inadequate number of viral copies  (<250 copies / mL). A negative result must be combined with clinical  observations, patient history, and epidemiological information. If result is POSITIVE SARS-CoV-2 target nucleic acids are DETECTED. The SARS-CoV-2 RNA is generally detectable in upper and lower  respiratory specimens dur ing the acute phase of infection.  Positive  results are indicative of active infection with SARS-CoV-2.  Clinical  correlation with patient history and other diagnostic information is  necessary to determine patient infection status.  Positive results do  not rule out bacterial infection or co-infection with other viruses. If result is PRESUMPTIVE POSTIVE SARS-CoV-2 nucleic acids MAY BE PRESENT.   A presumptive positive result was obtained on the submitted specimen  and confirmed on repeat testing.  While 2019 novel coronavirus  (SARS-CoV-2) nucleic acids may be present in the submitted sample  additional confirmatory testing may be necessary for epidemiological  and / or clinical management purposes  to differentiate between  SARS-CoV-2 and other Sarbecovirus currently known to infect humans.  If clinically indicated additional testing with an alternate test  methodology  [phone]) is advised. The SARS-CoV-2 RNA is generally  detectable in upper and lower respiratory sp ecimens during the acute  phase of infection. The expected result is Negative. Fact Sheet for Patients:  StrictlyIdeas.no Fact Sheet for Healthcare Providers: BankingDealers.co.za This test is not yet approved or cleared by the [place] FDA and has been authorized for detection and/or diagnosis of SARS-CoV-2 by FDA under an Emergency Use Authorization (EUA).  This EUA will remain in effect (meaning this test can be used) for the duration of the COVID-19 declaration under Section 564(b)(1) of the Act, 21 U.S.C. section 360bbb-3(b)(1), unless the authorization is terminated or revoked sooner. Performed at [facility], [place] [address], [place], [place] [address]   CBG monitoring, ED     Status: None   Collection Time: 11/22/18  7:29 PM  Result Value Ref Range   Glucose-Capillary 83 70 - 99 mg/dL   Dg Ankle 2 Views Right  Result Date: 11/22/2018 CLINICAL DATA:  Status post reduction for fracture/dislocation EXAM: RIGHT ANKLE - 2 VIEW COMPARISON:  Nov 22, 2018 study obtained earlier in the day FINDINGS: Frontal and lateral views obtained. Images obtained in plaster. There is a fracture the distal fibular diaphysis with mild posterior angulation distally. There is approximately 8 mm of overriding of fracture fragments. There is avulsion of the medial malleolus. Compared to study obtained earlier in the day, there is less ankle mortise disruption. The talus is no longer displaced entirely posterior to the tibial plafond. However, there does remain mild lateral and posterior displacement of the talus with respect to the plafond and. 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 IMPRESSION: There is a persistent degree of lateral and posterior displacement of the talus with respect to the tibial plafond. The gross  posterior displacement of the talus with respect to the tibial plafond seen prior to reduction is no longer evident. There are again noted fractures of the distal fibula and medial malleolar regions. No new fractures are demonstrated. There may be slightly less displacement of the distal medial malleolar fracture fragment compared to the more proximal fragment  in comparison with study obtained earlier in the day. Electronically Signed   By: Lowella Grip III M.D.   On: 11/22/2018 16:11   Dg Ankle Complete Right  Result Date: 11/22/2018 CLINICAL DATA:  Right ankle pain after fall. EXAM: RIGHT ANKLE - COMPLETE 3+ VIEW COMPARISON:  None. FINDINGS: Severely angulated and comminuted fracture of right distal fibula is noted. There is posterior dislocation of the talus relative to the tibia. Probable moderately displaced posterior malleolar fracture is noted. Moderately displaced medial malleolar fracture is noted as well. IMPRESSION: Posterior dislocation of talus relative to distal tibia with associated fractures of the distal right fibula, medial malleolus and posterior malleolus. Electronically Signed   By: Marijo Conception M.D.   On: 11/22/2018 14:48    Pending Labs [employer] (From admission, onward)    Start     Ordered   Signed and Occupational hygienist morning,   R     Signed and Held   Signed and Held  CBC  Tomorrow morning,   R     Signed and Held          Vitals/Pain Today's Vitals   11/22/18 1636 11/22/18 1700 11/22/18 1830 11/22/18 1930  BP:  135/74 (!) 144/77 115/63  Pulse:  73 72 71  Resp:  _0 Temp:      TempSrc:      SpO2:  96% 97% 100%  PainSc: 1        Isolation Precautions No active isolations  Medications Medications  HYDROmorphone (DILAUDID) injection 1 mg (1 mg Intravenous Given 11/22/18 1531)    Mobility manual wheelchair Low fall risk   Focused Assessments Skeletal   R Recommendations: See Admitting Provider  Note  Report given to:   Additional Notes:

## 2018-11-22 NOTE — ED Triage Notes (Addendum)
Per EMS, patient from apartment, reports trip and fall. Deformity to right ankle. Ambulatory with walker at baseline. 4L [place] at baseline.

## 2018-11-22 NOTE — ED Notes (Signed)
Remaining 47m of morphine wasted with HEarnest Bailey RTherapist, sports

## 2018-11-22 NOTE — ED Notes (Signed)
Bed: NF62 Expected date:  Expected time:  Means of arrival:  Comments: EMS deformity

## 2018-11-22 NOTE — ED Provider Notes (Addendum)
  Physical Exam  BP (!) 137/57   Pulse 72   Temp 98.2 F (36.8 C) (Oral)   Resp (!) 23   SpO2 100%   Physical Exam  ED Course/Procedures     Reduction of fracture Date/Time: 11/22/2018 4:10 PM Performed by: Sherwood Gambler, MD Authorized by: Sherwood Gambler, MD  Consent: Verbal consent obtained. Written consent obtained. Risks and benefits: risks, benefits and alternatives were discussed Patient identity confirmed: verbally with patient Preparation: Patient was prepped and draped in the usual sterile fashion. Local anesthesia used: no  Anesthesia: Local anesthesia used: no  Sedation: Patient sedated: no  Patient tolerance: Patient tolerated the procedure well with no immediate complications     MDM  Significant improvement after bedside reduction though still with some subluxation. D/w Dr. Louanne Skye, patient will need OR, likely today or tomorrow. Keep NPO. Numerous medical comorbitiies, will admit to hospitalist service.  Requests admission to [facility]. No need for further ankle manipulation.       Sherwood Gambler, MD 11/22/18 5449    Sherwood Gambler, MD 11/22/18 [id number]

## 2018-11-22 NOTE — Progress Notes (Signed)
Patient ID: Amy Shepard, female   DOB: [birth year], 73 y.o.   MRN: [id number] 73 year old female with Stage 4 CRF, COPD and diabetes. Patient known to Dr. Sharol Given. [place] today twisting her right ankle with severe pain and deformity. She was brought to [facility] ER and xrays show a right pronation external rotation ankle fracture dislocation. Reduced by Dr. Regenia Skeeter with conscious sedation. She is splinted and unable to stand or ambulate. I spoke with Dr. Regenia Skeeter, Dr. Sharol Given is made aware of this patient. He has requested thant the patient be transferred to [facility] on to the medicine service due to significant comorbidities. I will see her tonight and Dr. Sharol Given will plan to perform surgery on her tomorrow, he has cases already posted for surgery there tomorrow.  I will see her at [facility] and evaluate her and place preop orders then.

## 2018-11-22 NOTE — ED Notes (Signed)
Patient transported to X-ray

## 2018-11-23 ENCOUNTER — Inpatient Hospital Stay (HOSPITAL_COMMUNITY): Payer: Medicare Other | Admitting: Anesthesiology

## 2018-11-23 ENCOUNTER — Encounter (HOSPITAL_COMMUNITY)
Admission: EM | Disposition: A | Discharge status: Home Health | Payer: Self-pay | Source: Home / Self Care | Attending: Internal Medicine

## 2018-11-23 ENCOUNTER — Other Ambulatory Visit: Payer: Self-pay

## 2018-11-23 ENCOUNTER — Encounter (HOSPITAL_COMMUNITY): Payer: Self-pay

## 2018-11-23 DIAGNOSIS — I2781 Cor pulmonale (chronic): Secondary | ICD-10-CM

## 2018-11-23 DIAGNOSIS — J431 Panlobular emphysema: Secondary | ICD-10-CM

## 2018-11-23 DIAGNOSIS — I5032 Chronic diastolic (congestive) heart failure: Secondary | ICD-10-CM

## 2018-11-23 DIAGNOSIS — S93431A Sprain of tibiofibular ligament of right ankle, initial encounter: Secondary | ICD-10-CM

## 2018-11-23 DIAGNOSIS — I272 Pulmonary hypertension, unspecified: Secondary | ICD-10-CM

## 2018-11-23 DIAGNOSIS — S82851A Displaced trimalleolar fracture of right lower leg, initial encounter for closed fracture: Principal | ICD-10-CM

## 2018-11-23 DIAGNOSIS — S82891A Other fracture of right lower leg, initial encounter for closed fracture: Secondary | ICD-10-CM

## 2018-11-23 HISTORY — PX: ORIF ANKLE FRACTURE: SHX5408

## 2018-11-23 HISTORY — PX: APPLICATION OF WOUND VAC: SHX5189

## 2018-11-23 LAB — BASIC METABOLIC PANEL
Anion gap: 11 (ref 5–15)
Anion gap: 11 (ref 5–15)
BUN: 25 mg/dL — ABNORMAL HIGH (ref 8–23)
BUN: 25 mg/dL — ABNORMAL HIGH (ref 8–23)
CO2: 30 mmol/L (ref 22–32)
CO2: 26 mmol/L (ref 22–32)
Calcium: 9.1 mg/dL (ref 8.9–10.3)
Calcium: 8.8 mg/dL — ABNORMAL LOW (ref 8.9–10.3)
Chloride: 98 mmol/L (ref 98–111)
Chloride: 102 mmol/L (ref 98–111)
Creatinine, Ser: 1.51 mg/dL — ABNORMAL HIGH (ref 0.44–1.00)
Creatinine, Ser: 1.45 mg/dL — ABNORMAL HIGH (ref 0.44–1.00)
GFR calc Af Amer: 40 mL/min — ABNORMAL LOW (ref >60)
GFR calc Af Amer: 42 mL/min — ABNORMAL LOW (ref >60)
GFR calc non Af Amer: 34 mL/min — ABNORMAL LOW (ref >60)
GFR calc non Af Amer: 36 mL/min — ABNORMAL LOW (ref >60)
Glucose, Bld: 111 mg/dL — ABNORMAL HIGH (ref 70–99)
Glucose, Bld: 204 mg/dL — ABNORMAL HIGH (ref 70–99)
Potassium: 4 mmol/L (ref 3.5–5.1)
Potassium: 3.9 mmol/L (ref 3.5–5.1)
Sodium: 139 mmol/L (ref 135–145)
Sodium: 139 mmol/L (ref 135–145)

## 2018-11-23 LAB — POCT I-STAT 7, (LYTES, BLD GAS, ICA,H+H)
Acid-Base Excess: 2 mmol/L (ref 0.0–2.0)
Bicarbonate: 30.3 mmol/L — ABNORMAL HIGH (ref 20.0–28.0)
Calcium, Ion: 1.27 mmol/L (ref 1.15–1.40)
HCT: 25 % — ABNORMAL LOW (ref 36.0–46.0)
Hemoglobin: 8.5 g/dL — ABNORMAL LOW (ref 12.0–15.0)
O2 Saturation: 75 %
Patient temperature: 98.6
Potassium: 4.4 mmol/L (ref 3.5–5.1)
Sodium: 139 mmol/L (ref 135–145)
TCO2: 32 mmol/L (ref 22–32)
pCO2 arterial: 72.9 mmHg (ref 32.0–48.0)
pH, Arterial: 7.227 — ABNORMAL LOW (ref 7.350–7.450)
pO2, Arterial: 50 mmHg — ABNORMAL LOW (ref 83.0–108.0)

## 2018-11-23 LAB — CBC
HCT: 27.5 % — ABNORMAL LOW (ref 36.0–46.0)
HCT: 24.4 % — ABNORMAL LOW (ref 36.0–46.0)
Hemoglobin: 8.2 g/dL — ABNORMAL LOW (ref 12.0–15.0)
Hemoglobin: 6.9 g/dL — CL (ref 12.0–15.0)
MCH: 22.9 pg — ABNORMAL LOW (ref 26.0–34.0)
MCH: 22.6 pg — ABNORMAL LOW (ref 26.0–34.0)
MCHC: 29.8 g/dL — ABNORMAL LOW (ref 30.0–36.0)
MCHC: 28.3 g/dL — ABNORMAL LOW (ref 30.0–36.0)
MCV: 76.8 fL — ABNORMAL LOW (ref 80.0–100.0)
MCV: 80 fL (ref 80.0–100.0)
Platelets: 191 10*3/uL (ref 150–400)
Platelets: 192 10*3/uL (ref 150–400)
RBC: 3.58 MIL/uL — ABNORMAL LOW (ref 3.87–5.11)
RBC: 3.05 MIL/uL — ABNORMAL LOW (ref 3.87–5.11)
RDW: 17.5 % — ABNORMAL HIGH (ref 11.5–15.5)
RDW: 17.4 % — ABNORMAL HIGH (ref 11.5–15.5)
WBC: 7.1 10*3/uL (ref 4.0–10.5)
WBC: 7.4 10*3/uL (ref 4.0–10.5)
nRBC: 0 % (ref 0.0–0.2)
nRBC: 0 % (ref 0.0–0.2)

## 2018-11-23 LAB — PREPARE RBC (CROSSMATCH)

## 2018-11-23 LAB — MRSA PCR SCREENING: MRSA by PCR: NEGATIVE

## 2018-11-23 LAB — GLUCOSE, CAPILLARY
Glucose-Capillary: 107 mg/dL — ABNORMAL HIGH (ref 70–99)
Glucose-Capillary: 108 mg/dL — ABNORMAL HIGH (ref 70–99)

## 2018-11-23 SURGERY — OPEN REDUCTION INTERNAL FIXATION (ORIF) ANKLE FRACTURE
Anesthesia: Spinal | Site: Ankle | Laterality: Right

## 2018-11-23 MED ORDER — LIDOCAINE HCL (CARDIAC) PF 100 MG/5ML IV SOSY
PREFILLED_SYRINGE | INTRAVENOUS | Status: DC | PRN
  Administered 2018-11-23: 100 mg via INTRAVENOUS

## 2018-11-23 MED ORDER — DEXAMETHASONE SODIUM PHOSPHATE 10 MG/ML IJ SOLN
INTRAMUSCULAR | Status: AC
  Filled 2018-11-23: qty 2

## 2018-11-23 MED ORDER — PHENYLEPHRINE 40 MCG/ML (10ML) SYRINGE FOR IV PUSH (FOR BLOOD PRESSURE SUPPORT)
PREFILLED_SYRINGE | INTRAVENOUS | Status: AC
  Filled 2018-11-23: qty 10

## 2018-11-23 MED ORDER — PROPOFOL 500 MG/50ML IV EMUL
INTRAVENOUS | Status: DC | PRN
  Administered 2018-11-23: 50 ug/kg/min via INTRAVENOUS

## 2018-11-23 MED ORDER — PHENYLEPHRINE HCL (PRESSORS) 10 MG/ML IV SOLN
INTRAVENOUS | Status: DC | PRN
  Administered 2018-11-23: 120 ug via INTRAVENOUS
  Administered 2018-11-23 (×5): 80 ug via INTRAVENOUS

## 2018-11-23 MED ORDER — ONDANSETRON HCL 4 MG/2ML IJ SOLN
INTRAMUSCULAR | Status: AC
  Filled 2018-11-23: qty 2

## 2018-11-23 MED ORDER — DOPAMINE-DEXTROSE 3.2-5 MG/ML-% IV SOLN
INTRAVENOUS | Status: AC
  Administered 2018-11-23: 800 mg
  Filled 2018-11-23: qty 250

## 2018-11-23 MED ORDER — PROPOFOL 10 MG/ML IV BOLUS
INTRAVENOUS | Status: DC | PRN
  Administered 2018-11-23: 20 mg via INTRAVENOUS

## 2018-11-23 MED ORDER — ALBUMIN HUMAN 5 % IV SOLN
INTRAVENOUS | Status: DC | PRN
  Administered 2018-11-23: 15:00:00 via INTRAVENOUS

## 2018-11-23 MED ORDER — LIDOCAINE 2% (20 MG/ML) 5 ML SYRINGE
INTRAMUSCULAR | Status: AC
  Filled 2018-11-23: qty 5

## 2018-11-23 MED ORDER — ALBUMIN HUMAN 5 % IV SOLN
INTRAVENOUS | Status: AC
  Filled 2018-11-23: qty 250

## 2018-11-23 MED ORDER — CLINDAMYCIN PHOSPHATE 900 MG/50ML IV SOLN
INTRAVENOUS | Status: AC
  Filled 2018-11-23: qty 50

## 2018-11-23 MED ORDER — 0.9 % SODIUM CHLORIDE (POUR BTL) OPTIME
TOPICAL | Status: DC | PRN
  Administered 2018-11-23: 14:00:00 1000 mL

## 2018-11-23 MED ORDER — ONDANSETRON HCL 4 MG/2ML IJ SOLN
INTRAMUSCULAR | Status: DC | PRN
  Administered 2018-11-23: 4 mg via INTRAVENOUS

## 2018-11-23 MED ORDER — ALBUMIN HUMAN 5 % IV SOLN
INTRAVENOUS | Status: AC
  Administered 2018-11-23: 12.5 g
  Filled 2018-11-23: qty 250

## 2018-11-23 MED ORDER — EPHEDRINE SULFATE 50 MG/ML IJ SOLN
INTRAMUSCULAR | Status: DC | PRN
  Administered 2018-11-23: 10 mg via INTRAVENOUS
  Administered 2018-11-23: 5 mg via INTRAVENOUS
  Administered 2018-11-23: 10 mg via INTRAVENOUS

## 2018-11-23 MED ORDER — CLINDAMYCIN PHOSPHATE 900 MG/50ML IV SOLN
900.0000 mg | INTRAVENOUS | Status: AC
  Administered 2018-11-23: 900 mg via INTRAVENOUS

## 2018-11-23 MED ORDER — BUPIVACAINE IN DEXTROSE 0.75-8.25 % IT SOLN
INTRATHECAL | Status: DC | PRN
  Administered 2018-11-23: 1.6 mL via INTRATHECAL

## 2018-11-23 MED ORDER — ALBUMIN HUMAN 5 % IV SOLN
INTRAVENOUS | Status: AC
  Administered 2018-11-23: 12.5 g via INTRAVENOUS
  Filled 2018-11-23: qty 250

## 2018-11-23 MED ORDER — LACTATED RINGERS IV SOLN
INTRAVENOUS | Status: DC
  Administered 2018-11-23: 12:00:00 via INTRAVENOUS

## 2018-11-23 MED ORDER — SODIUM CHLORIDE 0.9% IV SOLUTION: Freq: Once | INTRAVENOUS | Status: DC

## 2018-11-23 MED ORDER — SODIUM CHLORIDE 0.9 % IV SOLN: INTRAVENOUS | Status: DC

## 2018-11-23 MED ORDER — DOPAMINE-DEXTROSE 3.2-5 MG/ML-% IV SOLN
2.0000 ug/kg/min | INTRAVENOUS | Status: DC
  Administered 2018-11-23: 5 ug/kg/min via INTRAVENOUS
  Administered 2018-11-23: 9 ug/kg/min via INTRAVENOUS
  Administered 2018-11-25: 3 ug/kg/min via INTRAVENOUS
  Filled 2018-11-23: qty 250

## 2018-11-23 MED ORDER — SODIUM CHLORIDE 0.9 % IV SOLN
INTRAVENOUS | Status: DC | PRN
  Administered 2018-11-23: 60 ug/min via INTRAVENOUS

## 2018-11-23 SURGICAL SUPPLY — 48 items
BANDAGE ESMARK 6X9 LF (GAUZE/BANDAGES/DRESSINGS) IMPLANT
BNDG CMPR 9X6 STRL LF SNTH (GAUZE/BANDAGES/DRESSINGS)
BNDG COHESIVE 4X5 TAN STRL (GAUZE/BANDAGES/DRESSINGS) ×3 IMPLANT
BNDG ESMARK 6X9 LF (GAUZE/BANDAGES/DRESSINGS)
BNDG GAUZE ELAST 4 BULKY (GAUZE/BANDAGES/DRESSINGS) ×3 IMPLANT
CANISTER WOUNDNEG PRESSURE 500 (CANNISTER) ×2 IMPLANT
COVER SURGICAL LIGHT HANDLE (MISCELLANEOUS) ×1 IMPLANT
COVER WAND RF STERILE (DRAPES) ×3 IMPLANT
DRAPE INCISE IOBAN 85X60 (DRAPES) ×2 IMPLANT
DRAPE OEC MINIVIEW 54X84 (DRAPES) ×2 IMPLANT
DRAPE U-SHAPE 47X51 STRL (DRAPES) ×3 IMPLANT
DRESSING PREVENA PLUS CUSTOM (GAUZE/BANDAGES/DRESSINGS) IMPLANT
DRSG ADAPTIC 3X8 NADH LF (GAUZE/BANDAGES/DRESSINGS) ×1 IMPLANT
DRSG PAD ABDOMINAL 8X10 ST (GAUZE/BANDAGES/DRESSINGS) ×1 IMPLANT
DRSG PREVENA PLUS CUSTOM (GAUZE/BANDAGES/DRESSINGS) ×3
DURAPREP 26ML APPLICATOR (WOUND CARE) ×3 IMPLANT
ELECT REM PT RETURN 9FT ADLT (ELECTROSURGICAL) ×3
ELECTRODE REM PT RTRN 9FT ADLT (ELECTROSURGICAL) ×1 IMPLANT
GAUZE SPONGE 4X4 12PLY STRL (GAUZE/BANDAGES/DRESSINGS) ×3 IMPLANT
GLOVE BIOGEL PI IND STRL 9 (GLOVE) ×1 IMPLANT
GLOVE BIOGEL PI INDICATOR 9 (GLOVE) ×2
GLOVE SURG ORTHO 9.0 STRL STRW (GLOVE) ×3 IMPLANT
GOWN STRL REUS W/ TWL XL LVL3 (GOWN DISPOSABLE) ×3 IMPLANT
GOWN STRL REUS W/TWL XL LVL3 (GOWN DISPOSABLE) ×9
KIT BASIN OR (CUSTOM PROCEDURE TRAY) ×3 IMPLANT
KIT TURNOVER KIT B (KITS) ×3 IMPLANT
MANIFOLD NEPTUNE II (INSTRUMENTS) ×1 IMPLANT
NS IRRIG 1000ML POUR BTL (IV SOLUTION) ×3 IMPLANT
PACK ORTHO EXTREMITY (CUSTOM PROCEDURE TRAY) ×3 IMPLANT
PAD ARMBOARD 7.5X6 YLW CONV (MISCELLANEOUS) ×8 IMPLANT
SCREW CORTEX 3.5 12MM (Screw) ×4 IMPLANT
SCREW CORTEX 3.5 14MM (Screw) ×2 IMPLANT
SCREW CORTEX 3.5 16MM (Screw) ×2 IMPLANT
SCREW LOCK CORT ST 3.5X12 (Screw) IMPLANT
SCREW LOCK CORT ST 3.5X14 (Screw) IMPLANT
SCREW LOCK CORT ST 3.5X16 (Screw) IMPLANT
SCREW SHORT THREAD 4.0X48 (Screw) ×2 IMPLANT
SPONGE LAP 18X18 RF (DISPOSABLE) ×2 IMPLANT
STAPLER VISISTAT 35W (STAPLE) IMPLANT
SUCTION FRAZIER HANDLE 10FR (MISCELLANEOUS) ×2
SUCTION TUBE FRAZIER 10FR DISP (MISCELLANEOUS) ×1 IMPLANT
SUT ETHILON 2 0 PSLX (SUTURE) ×2 IMPLANT
SUT VIC AB 2-0 CT1 27 (SUTURE) ×3
SUT VIC AB 2-0 CT1 TAPERPNT 27 (SUTURE) ×1 IMPLANT
TOWEL OR 17X24 6PK STRL BLUE (TOWEL DISPOSABLE) ×3 IMPLANT
TOWEL OR 17X26 10 PK STRL BLUE (TOWEL DISPOSABLE) ×3 IMPLANT
TUBE CONNECTING 12'X1/4 (SUCTIONS) ×1
TUBE CONNECTING 12X1/4 (SUCTIONS) ×2 IMPLANT

## 2018-11-23 NOTE — Progress Notes (Signed)
Dr Tyrell Antonio returned call to pacu/ updated on status

## 2018-11-23 NOTE — Progress Notes (Signed)
eLink Physician-Brief Progress Note Patient Name: Amy Shepard DOB: 1946/01/02 MRN: [id number]   Date of Service  11/23/2018  HPI/Events of Note  Pt s/p ankle ORIF under spinal anesthesia. Pt hypotensive post-op ultimately requiring low dose Dopamine infusion. Hemoglobin 6.9 gm down from 8.2 gm pre-op. [facility] requesting PCCM consult.  eICU Interventions  PCCM consultation requested of ground crew.        Kerry Kass Ogan 11/23/2018, 8:27 PM

## 2018-11-23 NOTE — Progress Notes (Signed)
Did not respond to name call/ noticed to have circumoral purplish color and not breathing/ (+) pulse checked x 2 , / nasal trumpet inserted, bvm assited breathing initiated and  Code bell for pacu initiated/ response team fro anesthesia immediately responded/ breathing spontaneously retuend withing 2-3 minutes, pulse remain intact at all times/ once pt began spontaneous resps, began to respond to name call and orientation intact

## 2018-11-23 NOTE — Progress Notes (Signed)
Spoke with dr e fitzgerald / mda re: sbp's 70's-80 with stable hr 70's / post spinal, alert / oriented and stable x for this/ orders for albumin rec'd

## 2018-11-23 NOTE — Progress Notes (Signed)
73 year old female with history of Stage 4 CRF, diabetes and COPD. Previous left foot ORIF 1 year ago for [facility] franc foot injury. Presented to [facility] ER with right ankle trimalleolar fracture dislocation 5/11. Underwent right ankle closed reduction with conscious sedation by Dr. Regenia Skeeter in the [facility] ER. Called radiographs show right ankle trimalleolar fracture dislocation with reduction and persistent subluxation of the ankle joint. Fracture is a pronation external rotation pattern with a 15-20% posterior malleolar fragment. Dr. Sharol Given was notified and requested Mrs. Bateson be transferred to [facility] for preop Admission for surgery to be done today.  Preop orders are entered. Recommend elevation of the right ankle and ice intermittantly to decrease swelling. Dr. Sharol Given plans surgery, she will be his third case this AM.

## 2018-11-23 NOTE — Progress Notes (Addendum)
Contacted patient's floor nurse Drue Dun about retrieving patient's partials.  [place], NT came and retrieved paritals _0 

## 2018-11-23 NOTE — Progress Notes (Signed)
Dr Tyrell Antonio paged by phone

## 2018-11-23 NOTE — Progress Notes (Signed)
Dr r fitzgerald notified of hgb 6.9 / orders rec'd to transfuse 1 unit prbcs

## 2018-11-23 NOTE — Progress Notes (Signed)
Received pt to floor at 2120 and Dr. Gilford Raid at bedside at 2135. Daughter called and given update by this nurse and Dr. Gilford Raid. Daughter to bring pt's home medication to hospital tomorrow to be dispensed by pharmacy.

## 2018-11-23 NOTE — Consult Note (Signed)
ORTHOPAEDIC CONSULTATION  REQUESTING PHYSICIAN: Elmarie Shiley, MD  Chief Complaint: Trimalleolar right ankle fracture.  HPI: Amy Shepard is a 73 y.o. female who presents with history of uncontrolled type 2 diabetes with protein caloric malnutrition who sustained a mechanical fall and has a trimalleolar right ankle fracture dislocation.  This was initially reduced in the emergency room partially splinted and patient presents for surgical intervention.  Patient presents urgently for surgery due to the persistent subluxation.  Past Medical History:  Diagnosis Date  . Arthritis   . CAD (coronary artery disease)    a. Prior cath 2015 showed 40% prox AD, 50-50% mLAD, otherwise calcification but no obstruction in LCx/RCA.. Medical management recommended. b. 2017: low-risk NST.  [name] Chronic diastolic CHF (congestive heart failure) ([place])   . Chronic respiratory failure ([place])   . CKD (chronic kidney disease), stage III ([place])   . COPD (chronic obstructive pulmonary disease) ([place])   . Cor pulmonale ([name])    a. felt due to advanced COPD and noncompliance with O2.  . Depression   . Diabetes mellitus    [name]    dx  2008  . Fracture    left foot  . Hx of seasonal allergies   . Hypercholesteremia   . Hypertension    on no medications  . Lung cancer ([place])   . On home oxygen therapy    "2.5L; 24/7" (03/10/2017)  . Pericardial effusion    a. small-moderate in 07/2016.  . Pulmonary hypertension ([place])   . UTI (urinary tract infection)    Past Surgical History:  Procedure Laterality Date  . [facility] in [place]     bilateral cataracts  . LEFT HEART CATHETERIZATION WITH CORONARY ANGIOGRAM N/A 07/12/2014   Procedure: LEFT HEART CATHETERIZATION WITH CORONARY ANGIOGRAM;  Surgeon: [name], MD;  Location: [facility] CATH LAB;  Service: Cardiovascular;  Laterality: N/A;  . MAXIMUM ACCESS (MAS)POSTERIOR LUMBAR INTERBODY  FUSION (PLIF) 2 LEVEL N/A 02/22/2016   Procedure: Lumbar three-four - Lumbar four-five  MAXIMUM ACCESS SURGERY  POSTERIOR LUMBAR INTERBODY FUSION;  Surgeon: [name], MD;  Location: [facility];  Service: Neurosurgery;  Laterality: N/A;  . ORIF TOE FRACTURE Left 06/12/2017   Procedure: OPEN REDUCTION INTERNAL FIXATION (ORIF) BASE 1ST METATARSAL (TOE) FRACTURE;  Surgeon: Newt Minion, MD;  Location: [place];  Service: Orthopedics;  Laterality: Left;  . RIGHT HEART CATH N/A 08/06/2017   Procedure: RIGHT HEART CATH;  Surgeon: Larey Dresser, MD;  Location: [facility] CV LAB;  Service: Cardiovascular;  Laterality: N/A;  . THORACOTOMY Right 2010   lower  . TONSILLECTOMY     Social History   Socioeconomic History  . Marital status: Divorced    Spouse name: Not on file  . Number of children: Not on file  . Years of education: Not on file  . Highest education level: Not on file  Occupational History  . Not on file  Social Needs  . Financial resource strain: Not on file  . Food insecurity:    Worry: Not on file    Inability: Not on file  . Transportation needs:    Medical: Not on file    Non-medical: Not on file  Tobacco Use  . Smoking status: Former Smoker    Packs/day: 1.00    Years: 30.00    Pack years: 30.00    Last attempt to  quit: 07/14/2006    Years since quitting: 12.3  . Smokeless tobacco: Never Used  Substance and Sexual Activity  . Alcohol use: No  . Drug use: No  . Sexual activity: Not on file  Lifestyle  . Physical activity:    Days per week: Not on file    Minutes per session: Not on file  . Stress: Not on file  Relationships  . Social connections:    Talks on phone: Not on file    Gets together: Not on file    Attends religious service: Not on file    Active member of club or organization: Not on file    Attends meetings of clubs or organizations: Not on file    Relationship status: Not on file  Other Topics Concern  . Not on file  Social History  Narrative  . Not on file   Family History  Problem Relation Age of Onset  . Diabetes Father   . Heart failure Father   . Cancer Sister   . Diabetes Brother   . Heart failure Brother    - negative except otherwise stated in the family history section Allergies  Allergen Reactions  . Amoxicillin Anaphylaxis, Hives, Rash and Other (See Comments)    Has patient had a PCN reaction causing immediate rash, facial/tongue/throat swelling, SOB or lightheadedness with hypotension: YES Positive reaction causing SEVERE RASH INVOLVING MUCUS MEMBRANES/SKIN NECROSIS: YES Reaction that required HOSPITALIZATION: YES Reaction occurring within the last 10 years: NO   Prior to Admission medications   Medication Sig Start Date End Date Taking? Authorizing Provider  atorvastatin (LIPITOR) 40 MG tablet Take 40 mg by mouth daily.  10/26/18  Yes [provider]  furosemide (LASIX) 40 MG tablet Take 1 tablet (40 mg total) by mouth 2 (two) times daily. 07/20/18  Yes Larey Dresser, MD  gabapentin (NEURONTIN) 300 MG capsule Take 300 mg by mouth 3 (three) times daily.   Yes [provider]  Insulin Detemir (LEVEMIR FLEXTOUCH) 100 UNIT/ML Pen Inject 36 Units into the skin at bedtime.    Yes [provider]  insulin lispro (HUMALOG) 100 UNIT/ML injection Inject 2-11 Units into the skin See admin instructions. Inject 6 units with each meal and add additional units per sliding scale: BGL 101-150 = 2 units; 151-200 = 3 units; 201-250 = 5 units; 251-300 = 7 units; 301-350 = 9 units; >350 = 11 units; CALL MD IF BGL IS >400 OR <60   Yes [provider]  macitentan (OPSUMIT) 10 MG tablet Take 1 tablet (10 mg total) by mouth daily. 08/25/18  Yes Larey Dresser, MD  OXYGEN Inhale 3 L into the lungs continuous. FOR COPD   Yes [provider]  Selexipag (UPTRAVI) 200 MCG TABS Take 3 tablets (600 mcg total) by mouth 2 (two) times daily. Patient taking differently: Take 400 mcg by mouth  2 (two) times a day.  10/22/18  Yes Larey Dresser, MD  venlafaxine XR (EFFEXOR-XR) 75 MG 24 hr capsule Take 75 mg by mouth daily with breakfast.  11/08/18  Yes [provider]  acetaminophen (TYLENOL) 325 MG tablet Take 650 mg by mouth every 6 (six) hours as needed for moderate pain or fever.     [provider]  atorvastatin (LIPITOR) 40 MG tablet Take 1 tablet (40 mg total) by mouth daily. 04/20/18 07/19/18  Larey Dresser, MD  Fluticasone-Salmeterol (ADVAIR) 250-50 MCG/DOSE AEPB Inhale 1 puff into the lungs 2 (two) times daily as  needed (shortness of breath/wheezing).     [provider]  guaiFENesin (MUCINEX) 600 MG 12 hr tablet Take 1 tablet (600 mg total) by mouth 2 (two) times daily as needed for cough or to loosen phlegm. Patient not taking: Reported on 11/22/2018 05/07/17   Aline August, MD  ipratropium-albuterol (DUONEB) 0.5-2.5 (3) MG/3ML SOLN Take 3 mLs by nebulization every 4 (four) hours as needed (wheezing, Shortness of breath). 03/13/16   Johnson, Clanford L, MD  midodrine (PROAMATINE) 5 MG tablet Take 1 tablet (5 mg total) by mouth 3 (three) times daily with meals. Patient not taking: Reported on 11/22/2018 07/22/18   Georgiana Shore, NP  potassium chloride SA (K-DUR,KLOR-CON) 20 MEQ tablet Take 2 tablets (40 mEq total) by mouth daily. Patient taking differently: Take 40 mEq by mouth daily as needed (low potassium).  06/29/18   Larey Dresser, MD  traMADol (ULTRAM) 50 MG tablet Take 1 tablet (50 mg total) by mouth every 8 (eight) hours as needed (for pain). 06/06/17   Danford, Suann Larry, MD  TRULICITY 1.5 MM/3.8TR SOPN Inject 1.5 mg into the vein once a week.  11/08/18   [provider]   Dg Ankle 2 Views Right  Result Date: 11/22/2018 CLINICAL DATA:  Status post reduction for fracture/dislocation EXAM: RIGHT ANKLE - 2 VIEW COMPARISON:  Nov 22, 2018 study obtained earlier in the day FINDINGS: Frontal and lateral views obtained. Images obtained  in plaster. There is a fracture the distal fibular diaphysis with mild posterior angulation distally. There is approximately 8 mm of overriding of fracture fragments. There is avulsion of the medial malleolus. Compared to study obtained earlier in the day, there is less ankle mortise disruption. The talus is no longer displaced entirely posterior to the tibial plafond. However, there does remain mild lateral and posterior displacement of the talus with respect to the plafond and. 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 IMPRESSION: There is a persistent degree of lateral and posterior displacement of the talus with respect to the tibial plafond. The gross posterior displacement of the talus with respect to the tibial plafond seen prior to reduction is no longer evident. There are again noted fractures of the distal fibula and medial malleolar regions. No new fractures are demonstrated. There may be slightly less displacement of the distal medial malleolar fracture fragment compared to the more proximal fragment in comparison with study obtained earlier in the day. Electronically Signed   By: Lowella Grip III M.D.   On: 11/22/2018 16:11   Dg Ankle Complete Right  Result Date: 11/22/2018 CLINICAL DATA:  Right ankle pain after fall. EXAM: RIGHT ANKLE - COMPLETE 3+ VIEW COMPARISON:  None. FINDINGS: Severely angulated and comminuted fracture of right distal fibula is noted. There is posterior dislocation of the talus relative to the tibia. Probable moderately displaced posterior malleolar fracture is noted. Moderately displaced medial malleolar fracture is noted as well. IMPRESSION: Posterior dislocation of talus relative to distal tibia with associated fractures of the distal right fibula, medial malleolus and posterior malleolus. Electronically Signed   By: Marijo Conception M.D.   On: 11/22/2018 14:48   - pertinent xrays, CT, MRI studies were reviewed and independently interpreted   Positive ROS: All other systems have been reviewed and were otherwise negative with the exception of those mentioned in the HPI and as above.  Physical Exam: General: Alert, no acute distress Psychiatric: Patient is competent for consent with normal mood and affect Lymphatic: No axillary or cervical  lymphadenopathy Cardiovascular: No pedal edema Respiratory: No cyanosis, no use of accessory musculature GI: No organomegaly, abdomen is soft and non-tender    Images:  _0 @  Labs:  Lab Results  Component Value Date   HGBA1C 7.2 (H) 04/11/2018   HGBA1C 14.4 (H) 05/02/2017   HGBA1C 13.6 (H) 03/11/2017   ESRSEDRATE 83 (H) 04/13/2018   ESRSEDRATE 37 (H) 08/13/2016   CRP 0.3 (L) 09/11/2016   REPTSTATUS 03/14/2018 FINAL 03/11/2018   CULT >=100,000 COLONIES/mL ESCHERICHIA COLI (A) 03/11/2018   LABORGA ESCHERICHIA COLI (A) 03/11/2018    Lab Results  Component Value Date   ALBUMIN 3.3 (L) 08/18/2018   ALBUMIN 3.6 07/21/2018   ALBUMIN 3.8 04/13/2018    Neurologic: Patient does not have protective sensation bilateral lower extremities.   MUSCULOSKELETAL:   Skin: Examination the leg is splinted skin is not examined.  She does have capillary refill less than 3 seconds in her toes.  Radiographs shows a trimalleolar ankle fracture on the right with persistent subluxation.  Patient has had a hemoglobin A1c that is ranged from 14.4-7.2.  No recent values are available from the past year.  Her most recent albumin was 3.3.  Patient has no complaints of pain.  Assessment: Assessment: Trimalleolar right ankle fracture with uncontrolled type 2 diabetes and protein caloric malnutrition.  Plan: Plan: Discussed with the patient proceeding with open reduction internal fixation of the fracture.  Patient will need a lateral plate syndesmotic screws and medial screws.  Discussed risks including increased risk of infection wounds not healing need for additional surgery.  Patient states she  understands wished to proceed at this time.  Anticipate patient will need discharge to skilled nursing.  Thank you for the consult and the opportunity to see Amy Shepard, [place] [phone] 7:01 AM

## 2018-11-23 NOTE — Progress Notes (Signed)
Amy Shepard/NP from [facility] here to see/ spoke via phone with [facility] in black box re; need for icu bed

## 2018-11-23 NOTE — Anesthesia Procedure Notes (Signed)
Spinal  Patient location during procedure: OR Start time: 11/23/2018 2:23 PM End time: 11/23/2018 2:28 PM Staffing Anesthesiologist: Josephine Igo, MD Performed: anesthesiologist  Preanesthetic Checklist Completed: patient identified, site marked, surgical consent, pre-op evaluation, timeout performed, IV checked, risks and benefits discussed and monitors and equipment checked Spinal Block Patient position: sitting Prep: site prepped and draped and DuraPrep Patient monitoring: heart rate, cardiac monitor, continuous pulse ox and blood pressure Approach: midline Location: L3-4 Injection technique: single-shot Needle Needle type: Pencan  Needle gauge: 24 G Needle length: 9 cm Needle insertion depth: 7 cm Assessment Sensory level: T4 Additional Notes Patient tolerated procedure well. Adequate sensory level. Attempt x 3. Difficult due to previous back surgery.

## 2018-11-23 NOTE — Anesthesia Postprocedure Evaluation (Addendum)
Anesthesia Post Note  Patient: Amy Shepard  Procedure(s) Performed: OPEN REDUCTION INTERNAL FIXATION (ORIF) RIGHT ANKLE FRACTURE (Right Ankle) Application Of Wound Vac (Right Ankle)     Patient location during evaluation: PACU Anesthesia Type: Spinal Level of consciousness: oriented and awake and alert Pain management: pain level controlled Vital Signs Assessment: post-procedure vital signs reviewed and stable Respiratory status: spontaneous breathing, respiratory function stable and patient connected to nasal cannula oxygen Cardiovascular status: blood pressure returned to baseline and stable Postop Assessment: no headache, no backache, no apparent nausea or vomiting, spinal receding and patient able to bend at knees Anesthetic complications: no    Last Vitals:  Vitals:   11/23/18 1700 11/23/18 1715  BP: (!) 88/48 (!) 95/44  Pulse:    Resp:    Temp:    SpO2:      Last Pain:  Vitals:   11/23/18 1600  TempSrc:   PainSc: 0-No pain                 Ketra Duchesne,W. EDMOND

## 2018-11-23 NOTE — Anesthesia Preprocedure Evaluation (Addendum)
Anesthesia Evaluation  Patient identified by MRN, date of birth, ID band Patient awake    Reviewed: Allergy & Precautions, NPO status , Patient's Chart, lab work & pertinent test results  Airway Mallampati: II  TM Distance: >3 FB Neck ROM: Full    Dental no notable dental hx. (+) Teeth Intact, Partial Upper   Pulmonary shortness of breath, with exertion, at rest and Long-Term Oxygen Therapy, COPD,  COPD inhaler and oxygen dependent, former smoker,    Pulmonary exam normal breath sounds clear to auscultation       Cardiovascular hypertension, Pt. on medications + CAD and +CHF  Normal cardiovascular exam Rhythm:Regular Rate:Normal  Severe Pulmonary HTN Echo 04/11/2019 Left ventricle: The cavity size was normal. Wall thickness was increased in a pattern of mild LVH. Systolic function was normal. The estimated ejection fraction was in the range of 55% to 60%. Wall motion was normal; there were no regional wall motion abnormalities. Doppler parameters are consistent with abnormal left ventricular relaxation (grade 1 diastolic dysfunction). Doppler parameters are consistent with high ventricular filling pressure. - Ventricular septum: The contour showed diastolic flattening and systolic flattening. - Mitral valve: Calcified annulus. - Left atrium: The atrium was mildly dilated. - Right ventricle: The cavity size was mildly dilated. Systolic function was moderately reduced. - Pulmonary arteries: Systolic pressure was severely increased. PA  peak pressure: 81 mm Hg (S).  Right Heart Catheterization 08/06/2017 1. Near-normal filling pressures.  2. Moderate pulmonary arterial hypertension with PVR 4.7 WU.  3. Preserved cardiac output.   Trial of pulmonary vasodilators would be reasonable.  Will start with Opsumit 10 mg daily.    Neuro/Psych PSYCHIATRIC DISORDERS Depression negative neurological ROS     GI/Hepatic negative GI ROS, Neg  liver ROS,   Endo/Other  diabetes, Well Controlled, Type 2, Insulin Dependent  Renal/GU Renal InsufficiencyRenal diseaseHx/o renal calculi  negative genitourinary   Musculoskeletal  (+) Arthritis , Osteoarthritis,  Trimalleolar Fx right ankle   Abdominal (+) + obese,   Peds  Hematology  (+) anemia ,   Anesthesia Other Findings   Reproductive/Obstetrics                            Anesthesia Physical Anesthesia Plan  ASA: III  Anesthesia Plan: Spinal   Post-op Pain Management:    Induction: Intravenous  PONV Risk Score and Plan:   Airway Management Planned: Natural Airway and Nasal Cannula  Additional Equipment:   Intra-op Plan:   Post-operative Plan:   Informed Consent: I have reviewed the patients History and Physical, chart, labs and discussed the procedure including the risks, benefits and alternatives for the proposed anesthesia with the patient or authorized representative who has indicated his/her understanding and acceptance.     Dental advisory given  Plan Discussed with: CRNA and Surgeon  Anesthesia Plan Comments:         Anesthesia Quick Evaluation

## 2018-11-23 NOTE — Progress Notes (Signed)
Inpatient Diabetes Program Recommendations  AACE/ADA: New Consensus Statement on Inpatient Glycemic Control (2015)  Target Ranges:  Prepandial:   less than 140 mg/dL      Peak postprandial:   less than 180 mg/dL (1-2 hours)      Critically ill patients:  140 - 180 mg/dL   Lab Results  Component Value Date   GLUCAP 107 (H) 11/23/2018   HGBA1C 7.2 (H) 04/11/2018    Review of Glycemic Control  Diabetes history: DM 2 Outpatient Diabetes medications: Levemir 36 units qhs, Humalog 2-11 units tid + Humalog 6 units tid meal coverage, Trulicity 1.5 mg Weekly Current orders for Inpatient glycemic control: Novolog 0-9 units tid  Inpatient Diabetes Program Recommendations:    A1c pending  Glucose low 107 this am. NPO. May need addition of basal insulin this evening if glucose trends increase and/or patient receives decadron during surgery.   Thanks,  Tama Headings RN, MSN, BC-ADM Inpatient Diabetes Coordinator Team Pager [phone] (8a-5p)

## 2018-11-23 NOTE — Op Note (Signed)
11/23/2018  3:42 PM  PATIENT:  Amy Shepard    PRE-OPERATIVE DIAGNOSIS:  Trimalleolar Right Ankle Fracture  POST-OPERATIVE DIAGNOSIS:  Same  PROCEDURE:  OPEN REDUCTION INTERNAL FIXATION (ORIF) RIGHT ANKLE FRACTURE,  Syndesmotic repair with a screw, application Of Wound Vac  SURGEON:  Newt Minion, MD  PHYSICIAN ASSISTANT:None ANESTHESIA:   Spinal  PREOPERATIVE INDICATIONS:  Amy Shepard is a  73 y.o. female with a diagnosis of Trimalleolar Right Ankle Fracture who failed conservative measures and elected for surgical management.    The risks benefits and alternatives were discussed with the patient preoperatively including but not limited to the risks of infection, bleeding, nerve injury, cardiopulmonary complications, the need for revision surgery, among others, and the patient was willing to proceed.  OPERATIVE IMPLANTS: One third tubular plate laterally with a syndesmotic screw and cannulated screws medially  _0 @  OPERATIVE FINDINGS: Syndesmotic disruption.  OPERATIVE PROCEDURE: Patient was brought the operating room and underwent a spinal anesthetic.  After adequate levels anesthesia were obtained patient's right lower extremity was prepped using DuraPrep draped into a sterile field a timeout was called.  A lateral incision was made over the fibula.  This was carried down to the fibula.  There was comminution at the fracture site that was proximal.  The plate was secured to the distal fragment the plate was then used to reduce the fracture and 2 compression screws were placed proximally to compression screws distally.  Using C arm fluoroscopy a syndesmotic screw was used to repair the syndesmotic disruption.  C-arm fluoroscopy verified restoration of the length of the fibula.  Attention was then focused on the medial malleolus.  The medial malleolus was comminuted and rotated.  This was reduced cleansed stabilized with a K wire C-arm fluoroscopy verified  alignment and a partially-threaded 40 mm screw was placed to secure the medial malleolus.  Radiographs showed reduction of the mortise.  The wounds were irrigated the incision was closed using 2-0 nylon.  The customizable Praveena wound VAC was then used to circumferentially wrap the ankle to decrease risk of the skin breakdown.  This had a good suction fit patient was taken to PACU in stable condition.   DISCHARGE PLANNING:  Antibiotic duration: 24 hours postoperatively clindamycin  Weightbearing: Nonweightbearing on the right for 3 weeks  Pain medication: ultram  Dressing care/ Wound VAC: Continue wound VAC for 1 week  Ambulatory devices: Walker and wheelchair  Discharge to: Anticipate discharge to skilled nursing  Follow-up: In the office 1 week post operative.

## 2018-11-23 NOTE — Progress Notes (Signed)
PROGRESS NOTE    Amy Shepard  POD:[id number] DOB: Dec 01, 1945 DOA: 11/22/2018 PCP: Maurice Small, MD    Brief Narrative: 73 year old with past medical history significant for ambulatory dysfunction uses a walker, arthritis, coronary artery disease, diastolic heart failure, chronic kidney disease a stage III, COPD on home oxygen 4 L, type II diabetic, hypertension, chronic hypoxic respiratory failure who presented after a mechanical fall.  She was standing, holding on her walker when she suddenly felt weak in the lower extremity.  She was unable to hold herself and fell twisting her right ankle. In the ED patient was diagnosed with right ankle fracture, it was reduced under conscious sedation and splinted.  She was evaluated by orthopedic doctor who recommended admission to the hospital for open reduction fixation of right ankle fracture.   Assessment & Plan:   Principal Problem:   Ankle fracture Active Problems:   Chronic obstructive pulmonary disease ([name])   Type 2 diabetes mellitus with complication, with long-term current use of insulin ([name])   HTN (hypertension)   Chronic diastolic CHF (congestive heart failure) ([name])   Cor pulmonale ([name])   Pulmonary HTN ([name])   Trimalleolar fracture of ankle, closed, right, initial encounter   1-right ankle fracture: Patient was seen and evaluated by Dr. Sharol Given, he is recommending open reduction and fixation of right ankle fracture. Patient to go to the OR today for surgery.  2-chronic diastolic heart failure: Continue to hold furosemide preop. Patient tolerating oral intake resume furosemide.  3-COPD with chronic hypoxic respiratory failure: Patient uses 4 L of oxygen at home. Monitor closely postop. Nebulizer treatment as needed. Continue with Dulera  4-type 2 diabetes with hyperglycemia: Patient presented with a CBG of 68. Continue to hold long-acting insulin.  Use a sliding scale insulin for now.  5-dyslipidemia: Continue with  atorvastatin.  Hypertension: Continue with macitentan and selexipag;  Will  hold Selexipag and Mactentan due to SBP soft. SBP in the 90./   Stage III chronic kidney disease: Creatinine baseline; 1.2--- 1.3. Continue to monitor closely   Estimated body mass index is 33.31 kg/m as calculated from the following:   Height as of this encounter: 5' 2" (1.575 m).   Weight as of this encounter: 82.6 kg.   DVT prophylaxis: SCD for now.  follow Ortho recommendation Code Status: Full code Family Communication: Care discussed with patient Disposition Plan: Patient remain in the hospital for treatment of acute ankle fracture, she will likely require to go to a rehab post discharge  Consultants:   Dr. Sharol Given   Procedures:  None   Antimicrobials:   Clindamycin preop   Subjective: Patient seen and examined, she denies chest pain, shortness of breath.  She denies chest pain or shortness of breath on exertion.  She uses 4 L of oxygen chronically  Objective: Vitals:   11/22/18 2140 11/22/18 2229 11/23/18 0341 11/23/18 1049  BP: 125/66 96/69 108/62 (!) 92/50  Pulse: 76 77 80 78  Resp: _0 Temp:  98.3 F (36.8 C) 97.9 F (36.6 C) 97.7 F (36.5 C)  TempSrc:  Oral Oral Oral  SpO2: 98% 95% 100% 90%  Weight: 82.6 kg     Height: 5' 2" (1.575 m)       Intake/Output Summary (Last 24 hours) at 11/23/2018 1132 Last data filed at 11/23/2018 0820 Gross per 24 hour  Intake 0 ml  Output -  Net 0 ml   Filed Weights   11/22/18 2140  Weight: 82.6 kg  Examination:  General exam: Appears calm and comfortable  Respiratory system: Clear to auscultation. Respiratory effort normal. Cardiovascular system: S1 & S2 heard, RRR. No JVD, murmurs, rubs, gallops or clicks. No pedal edema. Gastrointestinal system: Abdomen is nondistended, soft and nontender. No organomegaly or masses felt. Normal bowel sounds heard. Central nervous system: Alert and oriented. No focal neurological  deficits. Extremities: Right lower extremity with a splint Skin: No rashes, lesions or ulcers    Data Reviewed: I have personally reviewed following labs and imaging studies  CBC: Recent Labs  Lab 11/22/18 1615 11/23/18 0715  WBC 7.3 7.1  NEUTROABS 5.3  --   HGB 8.2* 8.2*  HCT 28.0* 27.5*  MCV 79.3* 76.8*  PLT 205 203   Basic Metabolic Panel: Recent Labs  Lab 11/22/18 1615 11/23/18 0715  NA 140 139  K 3.4* 4.0  CL 99 98  CO2 32 30  GLUCOSE 68* 111*  BUN 29* 25*  CREATININE 1.43* 1.51*  CALCIUM 9.3 9.1   GFR: Estimated Creatinine Clearance: 33.5 mL/min (A) (by C-G formula based on SCr of 1.51 mg/dL (H)). Liver Function Tests: No results for input(s): AST, ALT, ALKPHOS, BILITOT, PROT, ALBUMIN in the last 168 hours. No results for input(s): LIPASE, AMYLASE in the last 168 hours. No results for input(s): AMMONIA in the last 168 hours. Coagulation Profile: No results for input(s): INR, PROTIME in the last 168 hours. Cardiac Enzymes: No results for input(s): CKTOTAL, CKMB, CKMBINDEX, TROPONINI in the last 168 hours. BNP (last 3 results) No results for input(s): PROBNP in the last 8760 hours. HbA1C: No results for input(s): HGBA1C in the last 72 hours. CBG: Recent Labs  Lab 11/22/18 1929 11/23/18 0806  GLUCAP 83 107*   Lipid Profile: No results for input(s): CHOL, HDL, LDLCALC, TRIG, CHOLHDL, LDLDIRECT in the last 72 hours. Thyroid Function Tests: No results for input(s): TSH, T4TOTAL, FREET4, T3FREE, THYROIDAB in the last 72 hours. Anemia Panel: No results for input(s): VITAMINB12, FOLATE, FERRITIN, TIBC, IRON, RETICCTPCT in the last 72 hours. Sepsis Labs: No results for input(s): PROCALCITON, LATICACIDVEN in the last 168 hours.  Recent Results (from the past 240 hour(s))  SARS Coronavirus 2 (CEPHEID - Performed in [place] hospital lab), Hosp Order     Status: None   Collection Time: 11/22/18  5:34 PM  Result Value Ref Range Status   SARS Coronavirus  2 NEGATIVE NEGATIVE Final    Comment: (NOTE) If result is NEGATIVE SARS-CoV-2 target nucleic acids are NOT DETECTED. The SARS-CoV-2 RNA is generally detectable in upper and lower  respiratory specimens during the acute phase of infection. The lowest  concentration of SARS-CoV-2 viral copies this assay can detect is 250  copies / mL. A negative result does not preclude SARS-CoV-2 infection  and should not be used as the sole basis for treatment or other  patient management decisions.  A negative result may occur with  improper specimen collection / handling, submission of specimen other  than nasopharyngeal swab, presence of viral mutation(s) within the  areas targeted by this assay, and inadequate number of viral copies  (<250 copies / mL). A negative result must be combined with clinical  observations, patient history, and epidemiological information. If result is POSITIVE SARS-CoV-2 target nucleic acids are DETECTED. The SARS-CoV-2 RNA is generally detectable in upper and lower  respiratory specimens dur ing the acute phase of infection.  Positive  results are indicative of active infection with SARS-CoV-2.  Clinical  correlation with patient history and other diagnostic  information is  necessary to determine patient infection status.  Positive results do  not rule out bacterial infection or co-infection with other viruses. If result is PRESUMPTIVE POSTIVE SARS-CoV-2 nucleic acids MAY BE PRESENT.   A presumptive positive result was obtained on the submitted specimen  and confirmed on repeat testing.  While 2019 novel coronavirus  (SARS-CoV-2) nucleic acids may be present in the submitted sample  additional confirmatory testing may be necessary for epidemiological  and / or clinical management purposes  to differentiate between  SARS-CoV-2 and other Sarbecovirus currently known to infect humans.  If clinically indicated additional testing with an alternate test  methodology  [phone]) is advised. The SARS-CoV-2 RNA is generally  detectable in upper and lower respiratory sp ecimens during the acute  phase of infection. The expected result is Negative. Fact Sheet for Patients:  StrictlyIdeas.no Fact Sheet for Healthcare Providers: BankingDealers.co.za This test is not yet approved or cleared by the [place] FDA and has been authorized for detection and/or diagnosis of SARS-CoV-2 by FDA under an Emergency Use Authorization (EUA).  This EUA will remain in effect (meaning this test can be used) for the duration of the COVID-19 declaration under Section 564(b)(1) of the Act, 21 U.S.C. section 360bbb-3(b)(1), unless the authorization is terminated or revoked sooner. Performed at [facility], [place] [address] [place], [place] [address]   MRSA PCR Screening     Status: None   Collection Time: 11/22/18 11:35 PM  Result Value Ref Range Status   MRSA by PCR NEGATIVE NEGATIVE Final    Comment:        The GeneXpert MRSA Assay (FDA approved for NASAL specimens only), is one component of a comprehensive MRSA colonization surveillance program. It is not intended to diagnose MRSA infection nor to guide or monitor treatment for MRSA infections. Performed at [facility], [place] [address], [place],  [address]          Radiology Studies: Dg Ankle 2 Views Right  Result Date: 11/22/2018 CLINICAL DATA:  Status post reduction for fracture/dislocation EXAM: RIGHT ANKLE - 2 VIEW COMPARISON:  Nov 22, 2018 study obtained earlier in the day FINDINGS: Frontal and lateral views obtained. Images obtained in plaster. There is a fracture the distal fibular diaphysis with mild posterior angulation distally. There is approximately 8 mm of overriding of fracture fragments. There is avulsion of the medial malleolus. Compared to study obtained earlier in the day, there is less ankle mortise disruption.  The talus is no longer displaced entirely posterior to the tibial plafond. However, there does remain mild lateral and posterior displacement of the talus with respect to the plafond and. 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 IMPRESSION: There is a persistent degree of lateral and posterior displacement of the talus with respect to the tibial plafond. The gross posterior displacement of the talus with respect to the tibial plafond seen prior to reduction is no longer evident. There are again noted fractures of the distal fibula and medial malleolar regions. No new fractures are demonstrated. There may be slightly less displacement of the distal medial malleolar fracture fragment compared to the more proximal fragment in comparison with study obtained earlier in the day. Electronically Signed   By: Lowella Grip III M.D.   On: 11/22/2018 16:11   Dg Ankle Complete Right  Result Date: 11/22/2018 CLINICAL DATA:  Right ankle pain after fall. EXAM: RIGHT ANKLE - COMPLETE 3+ VIEW COMPARISON:  None. FINDINGS: Severely angulated and  comminuted fracture of right distal fibula is noted. There is posterior dislocation of the talus relative to the tibia. Probable moderately displaced posterior malleolar fracture is noted. Moderately displaced medial malleolar fracture is noted as well. IMPRESSION: Posterior dislocation of talus relative to distal tibia with associated fractures of the distal right fibula, medial malleolus and posterior malleolus. Electronically Signed   By: Marijo Conception M.D.   On: 11/22/2018 14:48        Scheduled Meds: . atorvastatin  40 mg Oral Daily  . gabapentin  300 mg Oral TID  . insulin aspart  0-9 Units Subcutaneous TID WC  . macitentan  10 mg Oral Daily  . mometasone-formoterol  2 puff Inhalation BID  . Selexipag  400 mcg Oral BID  . venlafaxine XR  75 mg Oral Q breakfast   Continuous Infusions:   LOS: 1 day    Time spent: 35 minutes.      Elmarie Shiley, MD [facility] Pager [phone]  If 7PM-7AM, please contact night-coverage [email or web address] Password [facility] 11/23/2018, 11:32 AM

## 2018-11-23 NOTE — Transfer of Care (Signed)
Immediate Anesthesia Transfer of Care Note  Patient: Amy Shepard  Procedure(s) Performed: OPEN REDUCTION INTERNAL FIXATION (ORIF) RIGHT ANKLE FRACTURE (Right Ankle) Application Of Wound Vac (Right Ankle)  Patient Location: PACU  Anesthesia Type:MAC and Spinal  Level of Consciousness: awake, alert  and oriented  Airway & Oxygen Therapy: Patient Spontanous Breathing and Patient connected to nasal cannula oxygen  Post-op Assessment: Report given to RN and Post -op Vital signs reviewed and stable  Post vital signs: Reviewed and stable  Last Vitals:  Vitals Value Taken Time  BP 74/50 11/23/2018  3:42 PM  Temp    Pulse 79 11/23/2018  3:43 PM  Resp 17 11/23/2018  3:43 PM  SpO2 82 % 11/23/2018  3:43 PM  Vitals shown include unvalidated device data.  Last Pain:  Vitals:   11/23/18 1149  TempSrc: Oral  PainSc:          Complications: No apparent anesthesia complications

## 2018-11-23 NOTE — Consult Note (Addendum)
..   NAME:  TEQUISHA MAAHS, MRN:  [id number], DOB:  1945-08-08, LOS: 1 ADMISSION DATE:  11/22/2018, CONSULTATION DATE:  11/23/2018 REFERRING MD:  DUDA MD, CHIEF COMPLAINT:  Hypotension   Brief History   73 yr old F w/ PMHx CAD, chronic hypoxic resp failure w/PH on Opsumit and Selexipeg, COPD, Type 2 DM, HTN, CKD stage III prsented on 5/11 w/ ankle pain. POD 0 ORIF Syndesmotic repair with a screw, and application Of Wound Vac. Hypotensive and somnolent post op. Started on Dopamine gtt. PCCM consulted for mgmt  History of present illness   73 yr old F w/ PMHx CAD, chronic hypoxic resp failure w/PH on Opsumit and Selexipeg, COPD, Type 2 DM, HTN, CKD stage III prsented on 5/11 w/ ankle pain. Noted to have fractures of the distal fibula and medial malleolar regions on imaging with lateral and posterior displacement of the talus with respect to the tibial plafond. Was evaluated by Ortho on 5/12 and taken for ORIF.   POD 0 ORIF Syndesmotic repair with a screw, and application Of Wound Vac. Prior to procedure she received a spinal block at L3-L4 by Anesthesia. EBL minimal.  Postoperatively BP Hypotensive and pt somnolent post op. Per RN not as responsive and not responding to sternal rub. Rec'd nasal airway w/ AMBU breaths for rescue and oxygenation improved pt was more responsive. Despite improved oxygenation Blood pressure remained soft and she was started on Dopamine gtt. PCCM consulted for mgmt  On my evaluation: pt is sleepy but easily arousable, follows commands not c/o pain. On NRB O2 sat 92% ( at baseline she is on 4L [place] at home) SBP 112 mmHg on Dopa w/ HR 106-110 bpm pending 1 u PRBC transfusion  Past Medical History  .[name] Active Ambulatory Problems    Diagnosis Date Noted  . Lung cancer ([place]) 09/01/2012  . Malignant neoplasm of lower lobe of right lung ([place]) 05/25/2014  . Coronary artery disease involving native coronary artery 07/12/2014  . Pain in the chest   . S/P lumbar spinal  fusion 02/22/2016  . Chronic obstructive pulmonary disease ([place]) 03/10/2016  . Type 2 diabetes mellitus with complication, with long-term current use of insulin ([place]) 03/11/2016  . Depression 03/11/2016  . Acute on chronic diastolic CHF (congestive heart failure) ([place]) 06/23/2016  . HTN (hypertension) 06/23/2016  . Acute respiratory failure with hypoxia ([place]) 02/09/2017  . Lactic acidosis 02/10/2017  . Acute on chronic respiratory failure ([place]) 03/10/2017  . Chronic pain 03/10/2017  . Chronic diastolic CHF (congestive heart failure) ([place]) 04/02/2017  . Cor pulmonale ([place]) 04/02/2017  . Dog bite 05/02/2017  . CHF exacerbation ([place]) 05/02/2017  . Acute diastolic CHF (congestive heart failure) ([place]) 05/02/2017  . Sepsis secondary to UTI ([place]) 06/02/2017  . Chronic respiratory failure with hypoxia ([place]) 06/03/2017  . Falls 06/03/2017  . Hypokalemia 06/03/2017  . Lisfranc dislocation, left, subsequent encounter 06/08/2017  . Bronchitis, acute 03/11/2018  . Fatigue 03/11/2018  . Anemia due to stage 4 chronic kidney disease ([place]) 03/11/2018  . Pulmonary fibrosis ([place]) 03/11/2018  . Pulmonary HTN ([place]) 03/11/2018  . Anemia 03/11/2018  . Dyspnea   . CKD (chronic kidney disease), stage IV ([place]) 04/09/2018  . Near syncope 04/09/2018  . Pain in joint, ankle and foot 04/10/2018  . Closed left fibular fracture 04/10/2018   Resolved Ambulatory Problems    Diagnosis Date Noted  . COPD exacerbation ([place]) 03/10/2016  . Acute respiratory failure with hypoxia ([place]) 06/23/2016  . CKD (chronic kidney  disease), stage III ([place]) 03/10/2017   Past Medical History:  Diagnosis Date  . Arthritis   . CAD (coronary artery disease)   . Chronic respiratory failure ([place])   . COPD (chronic obstructive pulmonary disease) ([place])   . Diabetes mellitus   . Fracture   . Hx of seasonal allergies   . Hypercholesteremia   . Hypertension   . On home oxygen therapy   . Pericardial effusion   . Pulmonary  hypertension ([place])   . UTI (urinary tract infection)      Significant Hospital Events   Desaturated and hypotensive post op  Consults:  PCCM>>>>>>11/23/2018 Ortho>>>>>>>11/23/2018  Procedures:  Spinal Block Location: L3-4 >>>>>>>>>>11/23/2018  OPEN REDUCTION INTERNAL FIXATION (ORIF) RIGHT ANKLE FRACTURE,  Syndesmotic repair with a screw, and application Of Wound Vac >>>>>>11/23/2018  Significant Diagnostic Tests:  5/12/ ECHO: Left ventricle: The cavity size was normal. Wall thickness was   increased in a pattern of mild LVH. Systolic function was normal.   The estimated ejection fraction was in the range of 55% to 60%.   Wall motion was normal; there were no regional wall motion   abnormalities. Doppler parameters are consistent with abnormal   left ventricular relaxation (grade 1 diastolic dysfunction).   Doppler parameters are consistent with high ventricular filling   pressure. - Ventricular septum: The contour showed diastolic flattening and   systolic flattening. - Mitral valve: Calcified annulus. - Left atrium: The atrium was mildly dilated. - Right ventricle: The cavity size was mildly dilated. Systolic   function was moderately reduced. - Pulmonary arteries: Systolic pressure was severely increased. PA   peak pressure: 81 mm Hg (S).  RHC 08/06/2017 RA mean 7 RV 54/11 PA 55/16, mean 34 PCWP mean 15  Oxygen saturations: PA 60% AO 98%  Cardiac Output (Fick) 4.05  Cardiac Index (Fick) 2.34 PVR 4.7 WU   11/22/2018 DG Ankle 2 view >>>>>>  IMPRESSION: There is a persistent degree of lateral and posterior displacement of the talus with respect to the tibial plafond. The gross posterior displacement of the talus with respect to the tibial plafond seen prior to reduction is no longer evident. There are again noted fractures of the distal fibula and medial malleolar regions. No new fractures are demonstrated. There may be slightly less displacement of the distal  medial malleolar fracture fragment compared to the more proximal fragment in comparison with study obtained earlier in the day.  Sig Labs: BG 204 AG 11 GFR 36 Hgb 6.9 from 8.2 Plts 192    Micro Data:  Negative MRSA PCR on 11/22/2018  Antimicrobials:  Clindamycin 11/23/2018   Interim history/subjective:  Pt somnolent and hypotensive in PACU. In NSR and started on Dopamine gtt by Anesthesia 1 u PRBC ordered for transfusion   PCCM consulted to transfer to ICU  Objective   Blood pressure (!) 101/53, pulse (!) 104, temperature 97.9 F (36.6 C), temperature source Temporal, resp. rate 17, height _0  (1.575 m), weight 82.6 kg, SpO2 94 %.        Intake/Output Summary (Last 24 hours) at 11/23/2018 2038 Last data filed at 11/23/2018 1528 Gross per 24 hour  Intake 950 ml  Output 100 ml  Net 850 ml   Filed Weights   11/22/18 2140 11/23/18 1227  Weight: 82.6 kg 82.6 kg    Examination: General: sleepy but arouses easily verbal and follows commands HENT: normocephalic atraumatic with NRB mask on Lungs: end exp wheeze radiating from upper airway, diminished at bases. Desaturates to  30% on RA Cardiovascular: S1 and S2 at increased rate no parasternal heave Abdomen: soft obese non tender with + BS Extremities: RLE has wound vac in place Neuro: moves LLE and LUE and RLE good grip strength no focal deficits Skin: intact dry    Assessment & Plan:  1. Hypotension secondary to multifactorial etiology --> anesthesia was several hrs ago.  --> severe PH  -> post operative drop in Hgb (EBL was minimal per operative note)  No bradycardic episodes on review started on Dopamine gtt by Anesthesia Plan: MAP goal >75mHg IVF resusciutation and Receiving 1 u PRBC transfusion>>>f/u H/H post Wean off Dopamine gtt as tolerated If BP doesn't improve may need Bedside ECHO to assess RV given h/o severe PH  2.  Chronic Hypoxic Respiratory Failure COPD and Severe PH on Opsumit and Selexipag at  home (PAP 853mg on last ECHO) Baseline 4L [place] at home Plan: Bronchodilators Q 6 continue on supplemental Oxygen to keep O2 sat >92%. Will get ABG to assess if there is a component of hypercapnia adding to somnolence>>may need NIPPV Continue to hold home meds>>> may need inhaled vasodilator therapy   3. S/p ORIF on wound vac Plan: Pain mgmt Monitor output of wound vac F/u Ortho recs  Best practice:  Diet: NPO Pain/Anxiety/Delirium protocol (if indicated): not at this time until BP improves VAP protocol (if indicated): not intubated DVT prophylaxis: per Ortho GI prophylaxis: Pepcid Glucose control: ISS Mobility: bedrest Code Status: FULL Family Communication: daughter: RaVerdis [phone]: ICU  Labs   CBC: Recent Labs  Lab 11/22/18 1615 11/23/18 0715 11/23/18 1913  WBC 7.3 7.1 7.4  NEUTROABS 5.3  --   --   HGB 8.2* 8.2* 6.9*  HCT 28.0* 27.5* 24.4*  MCV 79.3* 76.8* 80.0  PLT 205 191 [id number]  Basic Metabolic Panel: Recent Labs  Lab 11/22/18 1615 11/23/18 0715 11/23/18 1913  NA 140 139 139  K 3.4* 4.0 3.9  CL 99 98 102  CO2 32 30 26  GLUCOSE 68* 111* 204*  BUN 29* 25* 25*  CREATININE 1.43* 1.51* 1.45*  CALCIUM 9.3 9.1 8.8*   GFR: Estimated Creatinine Clearance: 34.9 mL/min (A) (by C-G formula based on SCr of 1.45 mg/dL (H)). Recent Labs  Lab 11/22/18 1615 11/23/18 0715 11/23/18 1913  WBC 7.3 7.1 7.4    Liver Function Tests: No results for input(s): AST, ALT, ALKPHOS, BILITOT, PROT, ALBUMIN in the last 168 hours. No results for input(s): LIPASE, AMYLASE in the last 168 hours. No results for input(s): AMMONIA in the last 168 hours.  ABG    Component Value Date/Time   PHART 7.586 (H) 06/02/2017 2051   PCO2ART 37.2 06/02/2017 2051   PO2ART 74.0 (L) 06/02/2017 2051   HCO3 26.1 08/06/2017 1114   HCO3 25.8 08/06/2017 1114   TCO2 25 01/29/2018 1711   ACIDBASEDEF 0.2 07/18/2008 0954   O2SAT 61.0 08/06/2017 1114   O2SAT 58.0  08/06/2017 1114     Coagulation Profile: No results for input(s): INR, PROTIME in the last 168 hours.  Cardiac Enzymes: No results for input(s): CKTOTAL, CKMB, CKMBINDEX, TROPONINI in the last 168 hours.  HbA1C: Hgb A1c MFr Bld  Date/Time Value Ref Range Status  04/11/2018 07:34 AM 7.2 (H) 4.8 - 5.6 % Final    Comment:    (NOTE) Pre diabetes:          5.7%-6.4% Diabetes:              >6.4% Glycemic control for   <  7.0% adults with diabetes   05/02/2017 01:08 PM 14.4 (H) 4.8 - 5.6 % Final    Comment:    (NOTE) Pre diabetes:          5.7%-6.4% Diabetes:              >6.4% Glycemic control for   <7.0% adults with diabetes     CBG: Recent Labs  Lab 11/22/18 1929 11/23/18 0806 11/23/18 1147  GLUCAP 83 107* 108*    Review of Systems:   Marland KitchenMarland KitchenReview of Systems  Unable to perform ROS: Mental status change    Past Medical History  She,  has a past medical history of Arthritis, CAD (coronary artery disease), Chronic diastolic CHF (congestive heart failure) ([place]), Chronic respiratory failure ([place]), CKD (chronic kidney disease), stage III ([place]), COPD (chronic obstructive pulmonary disease) ([place]), Cor pulmonale ([place]), Depression, Diabetes mellitus, Fracture, seasonal allergies, Hypercholesteremia, Hypertension, Lung cancer ([place]), On home oxygen therapy, Pericardial effusion, Pulmonary hypertension ([place]), and UTI (urinary tract infection).   Surgical History    Past Surgical History:  Procedure Laterality Date  . [facility] [place]     bilateral cataracts  . LEFT HEART CATHETERIZATION WITH CORONARY ANGIOGRAM N/A 07/12/2014   Procedure: LEFT HEART CATHETERIZATION WITH CORONARY ANGIOGRAM;  Surgeon: [name], MD;  Location: [facility] CATH LAB;  Service: Cardiovascular;  Laterality: N/A;  . MAXIMUM ACCESS (MAS)POSTERIOR LUMBAR INTERBODY FUSION (PLIF) 2 LEVEL N/A 02/22/2016   Procedure: Lumbar three-four - Lumbar  four-five  MAXIMUM ACCESS SURGERY  POSTERIOR LUMBAR INTERBODY FUSION;  Surgeon: [name], MD;  Location: [facility] NEURO ORS;  Service: Neurosurgery;  Laterality: N/A;  . ORIF TOE FRACTURE Left 06/12/2017   Procedure: OPEN REDUCTION INTERNAL FIXATION (ORIF) BASE 1ST METATARSAL (TOE) FRACTURE;  Surgeon: Newt Minion, MD;  Location: [facility];  Service: Orthopedics;  Laterality: Left;  . RIGHT HEART CATH N/A 08/06/2017   Procedure: RIGHT HEART CATH;  Surgeon: Larey Dresser, MD;  Location: [facility] CV LAB;  Service: Cardiovascular;  Laterality: N/A;  . THORACOTOMY Right 2010   lower  . TONSILLECTOMY       Social History   reports that she quit smoking about 12 years ago. She has a 30.00 pack-year smoking history. She has never used smokeless tobacco. She reports that she does not drink alcohol or use drugs.   Family History   Her family history includes Cancer in her sister; Diabetes in her brother and father; Heart failure in her brother and father.   Allergies Allergies  Allergen Reactions  . Amoxicillin Anaphylaxis, Hives, Rash and Other (See Comments)    Has patient had a PCN reaction causing immediate rash, facial/tongue/throat swelling, SOB or lightheadedness with hypotension: YES Positive reaction causing SEVERE RASH INVOLVING MUCUS MEMBRANES/SKIN NECROSIS: YES Reaction that required HOSPITALIZATION: YES Reaction occurring within the last 10 years: NO     Home Medications  Prior to Admission medications   Medication Sig Start Date End Date Taking? Authorizing Provider  atorvastatin (LIPITOR) 40 MG tablet Take 40 mg by mouth daily.  10/26/18  Yes [provider]  furosemide (LASIX) 40 MG tablet Take 1 tablet (40 mg total) by mouth 2 (two) times daily. 07/20/18  Yes Larey Dresser, MD  gabapentin (NEURONTIN) 300 MG capsule Take 300 mg by mouth 3 (three) times daily.   Yes [provider]  Insulin Detemir (LEVEMIR FLEXTOUCH) 100 UNIT/ML Pen  Inject 36 Units into the  skin at bedtime.    Yes [provider]  insulin lispro (HUMALOG) 100 UNIT/ML injection Inject 2-11 Units into the skin See admin instructions. Inject 6 units with each meal and add additional units per sliding scale: BGL 101-150 = 2 units; 151-200 = 3 units; 201-250 = 5 units; 251-300 = 7 units; 301-350 = 9 units; >350 = 11 units; CALL MD IF BGL IS >400 OR <60   Yes [provider]  macitentan (OPSUMIT) 10 MG tablet Take 1 tablet (10 mg total) by mouth daily. 08/25/18  Yes Larey Dresser, MD  OXYGEN Inhale 3 L into the lungs continuous. FOR COPD   Yes [provider]  Selexipag (UPTRAVI) 200 MCG TABS Take 3 tablets (600 mcg total) by mouth 2 (two) times daily. Patient taking differently: Take 400 mcg by mouth 2 (two) times a day.  10/22/18  Yes Larey Dresser, MD  venlafaxine XR (EFFEXOR-XR) 75 MG 24 hr capsule Take 75 mg by mouth daily with breakfast.  11/08/18  Yes [provider]  acetaminophen (TYLENOL) 325 MG tablet Take 650 mg by mouth every 6 (six) hours as needed for moderate pain or fever.     [provider]  atorvastatin (LIPITOR) 40 MG tablet Take 1 tablet (40 mg total) by mouth daily. 04/20/18 07/19/18  Larey Dresser, MD  Fluticasone-Salmeterol (ADVAIR) 250-50 MCG/DOSE AEPB Inhale 1 puff into the lungs 2 (two) times daily as needed (shortness of breath/wheezing).     [provider]  guaiFENesin (MUCINEX) 600 MG 12 hr tablet Take 1 tablet (600 mg total) by mouth 2 (two) times daily as needed for cough or to loosen phlegm. Patient not taking: Reported on 11/22/2018 05/07/17   Aline August, MD  ipratropium-albuterol (DUONEB) 0.5-2.5 (3) MG/3ML SOLN Take 3 mLs by nebulization every 4 (four) hours as needed (wheezing, Shortness of breath). 03/13/16   Johnson, Clanford L, MD  midodrine (PROAMATINE) 5 MG tablet Take 1 tablet (5 mg total) by mouth 3 (three) times daily with meals. Patient not taking: Reported on 11/22/2018 07/22/18   Georgiana Shore, NP  potassium chloride SA (K-DUR,KLOR-CON) 20 MEQ tablet Take 2 tablets (40 mEq total) by mouth daily. Patient taking differently: Take 40 mEq by mouth daily as needed (low potassium).  06/29/18   Larey Dresser, MD  traMADol (ULTRAM) 50 MG tablet Take 1 tablet (50 mg total) by mouth every 8 (eight) hours as needed (for pain). 06/06/17   Danford, Suann Larry, MD  TRULICITY 1.5 ES/9.7NP SOPN Inject 1.5 mg into the vein once a week.  11/08/18   [provider]     STAFF NOTE  I, Dr Seward Carol have personally reviewed patient's available data, including medical history, events of note, physical examination and test results as part of my evaluation. I have discussed with PA Shearon Stalls and other care providers such as pharmacist, RN and Elink.  In addition,  I personally evaluated patient. The patient is critically ill with multiple organ systems failure and requires high complexity decision making for assessment and support, frequent evaluation and titration of therapies, application of advanced monitoring technologies and extensive interpretation of multiple databases.   Critical Care Time devoted to patient care services described in this note is  24 Minutes. This time reflects time of care of this signee Dr Seward Carol. This critical care time does not reflect procedure time, or teaching time or supervisory time but could involve care discussion  time    Dr. Seward Carol Pulmonary Critical Care Medicine  11/23/2018 9:03 PM    Critical care time: 55 mins

## 2018-11-24 ENCOUNTER — Inpatient Hospital Stay (HOSPITAL_COMMUNITY): Payer: Medicare Other

## 2018-11-24 DIAGNOSIS — J9622 Acute and chronic respiratory failure with hypercapnia: Secondary | ICD-10-CM

## 2018-11-24 DIAGNOSIS — J95822 Acute and chronic postprocedural respiratory failure: Secondary | ICD-10-CM

## 2018-11-24 DIAGNOSIS — J9621 Acute and chronic respiratory failure with hypoxia: Secondary | ICD-10-CM

## 2018-11-24 DIAGNOSIS — I9581 Postprocedural hypotension: Secondary | ICD-10-CM

## 2018-11-24 DIAGNOSIS — S9304XA Dislocation of right ankle joint, initial encounter: Secondary | ICD-10-CM

## 2018-11-24 LAB — POCT I-STAT 7, (LYTES, BLD GAS, ICA,H+H)
Acid-Base Excess: 4 mmol/L — ABNORMAL HIGH (ref 0.0–2.0)
Acid-Base Excess: 4 mmol/L — ABNORMAL HIGH (ref 0.0–2.0)
Bicarbonate: 31.5 mmol/L — ABNORMAL HIGH (ref 20.0–28.0)
Bicarbonate: 31.4 mmol/L — ABNORMAL HIGH (ref 20.0–28.0)
Calcium, Ion: 1.26 mmol/L (ref 1.15–1.40)
Calcium, Ion: 1.25 mmol/L (ref 1.15–1.40)
HCT: 25 % — ABNORMAL LOW (ref 36.0–46.0)
HCT: 19 % — ABNORMAL LOW (ref 36.0–46.0)
Hemoglobin: 8.5 g/dL — ABNORMAL LOW (ref 12.0–15.0)
Hemoglobin: 6.5 g/dL — CL (ref 12.0–15.0)
O2 Saturation: 86 %
O2 Saturation: 85 %
Patient temperature: 98.6
Patient temperature: 98.6
Potassium: 4.3 mmol/L (ref 3.5–5.1)
Potassium: 4.2 mmol/L (ref 3.5–5.1)
Sodium: 139 mmol/L (ref 135–145)
Sodium: 119 mmol/L — CL (ref 135–145)
TCO2: 33 mmol/L — ABNORMAL HIGH (ref 22–32)
TCO2: 33 mmol/L — ABNORMAL HIGH (ref 22–32)
pCO2 arterial: 65.8 mmHg (ref 32.0–48.0)
pCO2 arterial: 63.6 mmHg — ABNORMAL HIGH (ref 32.0–48.0)
pH, Arterial: 7.288 — ABNORMAL LOW (ref 7.350–7.450)
pH, Arterial: 7.301 — ABNORMAL LOW (ref 7.350–7.450)
pO2, Arterial: 60 mmHg — ABNORMAL LOW (ref 83.0–108.0)
pO2, Arterial: 56 mmHg — ABNORMAL LOW (ref 83.0–108.0)

## 2018-11-24 LAB — BASIC METABOLIC PANEL
Anion gap: 9 (ref 5–15)
BUN: 26 mg/dL — ABNORMAL HIGH (ref 8–23)
CO2: 29 mmol/L (ref 22–32)
Calcium: 8.8 mg/dL — ABNORMAL LOW (ref 8.9–10.3)
Chloride: 100 mmol/L (ref 98–111)
Creatinine, Ser: 1.57 mg/dL — ABNORMAL HIGH (ref 0.44–1.00)
GFR calc Af Amer: 38 mL/min — ABNORMAL LOW (ref >60)
GFR calc non Af Amer: 33 mL/min — ABNORMAL LOW (ref >60)
Glucose, Bld: 137 mg/dL — ABNORMAL HIGH (ref 70–99)
Potassium: 4.1 mmol/L (ref 3.5–5.1)
Sodium: 138 mmol/L (ref 135–145)

## 2018-11-24 LAB — GLUCOSE, CAPILLARY
Glucose-Capillary: 119 mg/dL — ABNORMAL HIGH (ref 70–99)
Glucose-Capillary: 131 mg/dL — ABNORMAL HIGH (ref 70–99)
Glucose-Capillary: 137 mg/dL — ABNORMAL HIGH (ref 70–99)
Glucose-Capillary: 141 mg/dL — ABNORMAL HIGH (ref 70–99)
Glucose-Capillary: 149 mg/dL — ABNORMAL HIGH (ref 70–99)

## 2018-11-24 LAB — CBC
HCT: 26.4 % — ABNORMAL LOW (ref 36.0–46.0)
Hemoglobin: 7.9 g/dL — ABNORMAL LOW (ref 12.0–15.0)
MCH: 23.7 pg — ABNORMAL LOW (ref 26.0–34.0)
MCHC: 29.9 g/dL — ABNORMAL LOW (ref 30.0–36.0)
MCV: 79.3 fL — ABNORMAL LOW (ref 80.0–100.0)
Platelets: 164 10*3/uL (ref 150–400)
RBC: 3.33 MIL/uL — ABNORMAL LOW (ref 3.87–5.11)
RDW: 17.2 % — ABNORMAL HIGH (ref 11.5–15.5)
WBC: 7.6 10*3/uL (ref 4.0–10.5)
nRBC: 0 % (ref 0.0–0.2)

## 2018-11-24 MED ORDER — ONDANSETRON HCL 4 MG PO TABS: 4.0000 mg | ORAL_TABLET | Freq: Four times a day (QID) | ORAL | Status: DC | PRN

## 2018-11-24 MED ORDER — POLYETHYLENE GLYCOL 3350 17 G PO PACK: 17.0000 g | PACK | Freq: Every day | ORAL | Status: DC | PRN

## 2018-11-24 MED ORDER — FUROSEMIDE 10 MG/ML IJ SOLN
40.0000 mg | Freq: Once | INTRAMUSCULAR | Status: AC
  Administered 2018-11-24: 11:00:00 40 mg via INTRAVENOUS
  Filled 2018-11-24: qty 4

## 2018-11-24 MED ORDER — METOCLOPRAMIDE HCL 5 MG/ML IJ SOLN: 5.0000 mg | Freq: Three times a day (TID) | INTRAMUSCULAR | Status: DC | PRN

## 2018-11-24 MED ORDER — MAGNESIUM CITRATE PO SOLN: 1.0000 | Freq: Once | ORAL | Status: DC | PRN

## 2018-11-24 MED ORDER — BISACODYL 10 MG RE SUPP: 10.0000 mg | Freq: Every day | RECTAL | Status: DC | PRN

## 2018-11-24 MED ORDER — METOCLOPRAMIDE HCL 5 MG PO TABS: 5.0000 mg | ORAL_TABLET | Freq: Three times a day (TID) | ORAL | Status: DC | PRN

## 2018-11-24 MED ORDER — ONDANSETRON HCL 4 MG/2ML IJ SOLN: 4.0000 mg | Freq: Four times a day (QID) | INTRAMUSCULAR | Status: DC | PRN

## 2018-11-24 MED ORDER — GABAPENTIN 300 MG PO CAPS
300.0000 mg | ORAL_CAPSULE | Freq: Every day | ORAL | Status: DC
  Administered 2018-11-25 – 2018-11-30 (×6): 300 mg via ORAL
  Filled 2018-11-24 (×6): qty 1

## 2018-11-24 MED ORDER — MACITENTAN 10 MG PO TABS
10.0000 mg | ORAL_TABLET | Freq: Every day | ORAL | Status: DC
  Administered 2018-11-24 – 2018-12-01 (×8): 10 mg via ORAL
  Filled 2018-11-24 (×9): qty 1

## 2018-11-24 MED ORDER — CHLORHEXIDINE GLUCONATE 0.12 % MT SOLN
15.0000 mL | Freq: Two times a day (BID) | OROMUCOSAL | Status: DC
  Administered 2018-11-25 – 2018-11-30 (×9): 15 mL via OROMUCOSAL
  Filled 2018-11-24 (×11): qty 15

## 2018-11-24 MED ORDER — METHOCARBAMOL 1000 MG/10ML IJ SOLN
500.0000 mg | Freq: Four times a day (QID) | INTRAVENOUS | Status: DC | PRN
  Filled 2018-11-24: qty 5

## 2018-11-24 MED ORDER — DOCUSATE SODIUM 100 MG PO CAPS
100.0000 mg | ORAL_CAPSULE | Freq: Two times a day (BID) | ORAL | Status: DC
  Administered 2018-11-24 – 2018-12-01 (×9): 100 mg via ORAL
  Filled 2018-11-24 (×10): qty 1

## 2018-11-24 MED ORDER — ACETAMINOPHEN 325 MG PO TABS
650.0000 mg | ORAL_TABLET | Freq: Four times a day (QID) | ORAL | Status: DC | PRN
  Administered 2018-11-24 – 2018-11-30 (×8): 650 mg via ORAL
  Filled 2018-11-24 (×8): qty 2

## 2018-11-24 MED ORDER — CLINDAMYCIN PHOSPHATE 600 MG/50ML IV SOLN
600.0000 mg | Freq: Four times a day (QID) | INTRAVENOUS | Status: AC
  Administered 2018-11-24 (×3): 600 mg via INTRAVENOUS
  Filled 2018-11-24 (×3): qty 50

## 2018-11-24 MED ORDER — SELEXIPAG 200 MCG PO TABS
400.0000 ug | ORAL_TABLET | Freq: Two times a day (BID) | ORAL | Status: DC
  Administered 2018-11-24 – 2018-12-01 (×14): 400 ug via ORAL
  Filled 2018-11-24 (×25): qty 2

## 2018-11-24 MED ORDER — MIDODRINE HCL 5 MG PO TABS
5.0000 mg | ORAL_TABLET | Freq: Three times a day (TID) | ORAL | Status: DC
  Administered 2018-11-24 – 2018-11-26 (×5): 5 mg via ORAL
  Filled 2018-11-24 (×5): qty 1

## 2018-11-24 MED ORDER — SODIUM CHLORIDE 0.9 % IV SOLN
INTRAVENOUS | Status: DC
  Administered 2018-11-24: 12:00:00 via INTRAVENOUS

## 2018-11-24 MED ORDER — METHOCARBAMOL 500 MG PO TABS
500.0000 mg | ORAL_TABLET | Freq: Four times a day (QID) | ORAL | Status: DC | PRN
  Administered 2018-12-01: 500 mg via ORAL
  Filled 2018-11-24: qty 1

## 2018-11-24 MED ORDER — ORAL CARE MOUTH RINSE
15.0000 mL | Freq: Two times a day (BID) | OROMUCOSAL | Status: DC
  Administered 2018-11-25 (×2): 15 mL via OROMUCOSAL

## 2018-11-24 NOTE — Progress Notes (Signed)
PT Cancellation Note  Patient Details Name: Amy Shepard MRN: [id number] DOB: [birth year]   Cancelled Treatment:    Reason Eval/Treat Not Completed: Patient not medically ready. Per chart review, pt with Hgb at 6.5 and pt on BIPAP this AM. PT will continue to follow-up with pt acutely and await appropriateness for evaluation.    [place] 11/24/2018, 8:03 AM

## 2018-11-24 NOTE — Progress Notes (Signed)
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 Sharol Given underwent ORIF yesterday 5/12 -Postop course complicated by hypotension, hypoxia and anemia -Recommended to continue wound VAC for 1 week, nonweightbearing for 3 weeks and follow-up in 1 week -PT eval once BP and respiratory status more stable  2.  Acute on chronic hypoxic respiratory failure -Patient has advanced COPD with chronic respiratory failure requiring 4 L O2 and severe pulmonary hypertension at baseline -Continue BiPAP, wean O2 -Lasix x1, await pulmonary input -Resume pulmonary hypertension meds -Chest x-ray today  3.  Hypotension -Postop likely secondary to blood loss anemia, and pulmonary hypertension/mild volume overload, nitric oxide -PCCM following, wean off dopamine as tolerated -Midodrine started today -Lasix x1 -Given 1 unit of PRBC overnight, hemoglobin up to 7.9 -Resume pulmonary hypertension meds  4.  Severe chronic pulmonary hypertension -needs CODE STATUS discussion -DC nitric oxide and resume selexipag and macitentan,   5.  Acute on chronic diastolic CHF -IV Lasix x1 today -As needed daily  6-COPD with chronic hypoxic respiratory failure: On 4 L home O2  -As above  7-type 2 diabetes with hyperglycemia: -Long-acting insulin on hold, CBG stable, resume sliding  scale insulin -Resume long-acting insulin when oral intake restarted  Stage III chronic kidney disease: Creatinine baseline; 1.2--- 1.3. Continue to monitor closely  DVT prophylaxis: Add subcutaneous heparin Code Status: Full code Family Communication: No family at bedside discussed with the patient will update family later today Disposition Plan: May need rehab  Consultants:   Dr. Sharol Given   Procedures:  None   Antimicrobials:   Clindamycin preop   Subjective: -Denies any chest pain, mild dyspnea -remains on low-dose dopamine and BiPAP  Objective: Vitals:   11/24/18 1000 11/24/18 1015 11/24/18 1030 11/24/18 1045  BP: (!) 87/50 (!) 84/53 (!) 89/52 (!) 93/50  Pulse: 77 77 78 80  Resp: _0 Temp:      TempSrc:      SpO2: 93% 91% 91% 94%  Weight:      Height:        Intake/Output Summary (Last 24 hours) at 11/24/2018 1112 Last data filed at 11/24/2018 0600 Gross per 24 hour  Intake 1550 ml  Output 1350 ml  Net 200 ml   Filed Weights   11/22/18 2140 11/23/18 1227  Weight: 82.6 kg 82.6 kg    Examination:  General exam: Elderly frail, chronically ill female, resting on BiPAP Respiratory system: Few basilar crackles, rest clear Cardiovascular system: S1 & S2 heard, RRR Gastrointestinal system: Abdomen is nondistended, soft and nontender. No organomegaly or masses felt. Normal bowel sounds heard. Central nervous system: Awake and alert, unable to assess orientation on BiPAP Extremities, right lower extremity with a splint Skin: No rashes   Data Reviewed: I have personally reviewed following labs and imaging studies  CBC: Recent Labs  Lab 11/22/18 1615 11/23/18 0715 11/23/18 1913 11/23/18 2157 11/24/18 0228 11/24/18 8832 11/24/18 5498  WBC 7.3 7.1 7.4  --   --   --  7.6  NEUTROABS 5.3  --   --   --   --   --   --   HGB 8.2* 8.2* 6.9* 8.5* 8.5* 6.5* 7.9*  HCT 28.0* 27.5* 24.4* 25.0* 25.0* 19.0* 26.4*  MCV 79.3* 76.8* 80.0  --   --   --  79.3*   PLT 205 191 192  --   --   --  416   Basic Metabolic Panel: Recent Labs  Lab 11/22/18 1615 11/23/18 0715 11/23/18 1913 11/23/18 2157 11/24/18 0228 11/24/18 0552  NA 140 139 139 139 139 119*  K 3.4* 4.0 3.9 4.4 4.3 4.2  CL 99 98 102  --   --   --   CO2 32 30 26  --   --   --   GLUCOSE 68* 111* 204*  --   --   --   BUN 29* 25* 25*  --   --   --   CREATININE 1.43* 1.51* 1.45*  --   --   --   CALCIUM 9.3 9.1 8.8*  --   --   --    GFR: Estimated Creatinine Clearance: 34.9 mL/min (A) (by C-G formula based on SCr of 1.45 mg/dL (H)). Liver Function Tests: No results for input(s): AST, ALT, ALKPHOS, BILITOT, PROT, ALBUMIN in the last 168 hours. No results for input(s): LIPASE, AMYLASE in the last 168 hours. No results for input(s): AMMONIA in the last 168 hours. Coagulation Profile: No results for input(s): INR, PROTIME in the last 168 hours. Cardiac Enzymes: No results for input(s): CKTOTAL, CKMB, CKMBINDEX, TROPONINI in the last 168 hours. BNP (last 3 results) No results for input(s): PROBNP in the last 8760 hours. HbA1C: No results for input(s): HGBA1C in the last 72 hours. CBG: Recent Labs  Lab 11/22/18 1929 11/23/18 0806 11/23/18 1147 11/24/18 0829  GLUCAP 83 107* 108* 137*   Lipid Profile: No results for input(s): CHOL, HDL, LDLCALC, TRIG, CHOLHDL, LDLDIRECT in the last 72 hours. Thyroid Function Tests: No results for input(s): TSH, T4TOTAL, FREET4, T3FREE, THYROIDAB in the last 72 hours. Anemia Panel: No results for input(s): VITAMINB12, FOLATE, FERRITIN, TIBC, IRON, RETICCTPCT in the last 72 hours. Sepsis Labs: No results for input(s): PROCALCITON, LATICACIDVEN in the last 168 hours.  Recent Results (from the past 240 hour(s))  SARS Coronavirus 2 (CEPHEID - Performed in [place] hospital lab), Hosp Order     Status: None   Collection Time: 11/22/18  5:34 PM  Result Value Ref Range Status   SARS Coronavirus 2 NEGATIVE NEGATIVE Final    Comment: (NOTE) If  result is NEGATIVE SARS-CoV-2 target nucleic acids are NOT DETECTED. The SARS-CoV-2 RNA is generally detectable in upper and lower  respiratory specimens during the acute phase of infection. The lowest  concentration of SARS-CoV-2 viral copies this assay can detect is 250  copies / mL. A negative result does not preclude SARS-CoV-2 infection  and should not be used as the sole basis for treatment or other  patient management decisions.  A negative result may occur with  improper specimen collection / handling, submission of specimen other  than nasopharyngeal swab, presence of viral mutation(s) within the  areas targeted by this assay, and inadequate number of viral copies  (<250 copies / mL). A negative result must be combined with clinical  observations, patient history, and epidemiological information. If result is POSITIVE SARS-CoV-2 target nucleic acids are DETECTED.  The SARS-CoV-2 RNA is generally detectable in upper and lower  respiratory specimens dur ing the acute phase of infection.  Positive  results are indicative of active infection with SARS-CoV-2.  Clinical  correlation with patient history and other diagnostic information is  necessary to determine patient infection status.  Positive results do  not rule out bacterial infection or co-infection with other viruses. If result is PRESUMPTIVE POSTIVE SARS-CoV-2 nucleic acids MAY BE PRESENT.   A presumptive positive result was obtained on the submitted specimen  and confirmed on repeat testing.  While 2019 novel coronavirus  (SARS-CoV-2) nucleic acids may be present in the submitted sample  additional confirmatory testing may be necessary for epidemiological  and / or clinical management purposes  to differentiate between  SARS-CoV-2 and other Sarbecovirus currently known to infect humans.  If clinically indicated additional testing with an alternate test  methodology [phone]) is advised. The SARS-CoV-2 RNA is generally    detectable in upper and lower respiratory sp ecimens during the acute  phase of infection. The expected result is Negative. Fact Sheet for Patients:  StrictlyIdeas.no Fact Sheet for Healthcare Providers: BankingDealers.co.za This test is not yet approved or cleared by the [place] FDA and has been authorized for detection and/or diagnosis of SARS-CoV-2 by FDA under an Emergency Use Authorization (EUA).  This EUA will remain in effect (meaning this test can be used) for the duration of the COVID-19 declaration under Section 564(b)(1) of the Act, 21 U.S.C. section 360bbb-3(b)(1), unless the authorization is terminated or revoked sooner. Performed at [facility], [place] [address], [place], [place] [address]   MRSA PCR Screening     Status: None   Collection Time: 11/22/18 11:35 PM  Result Value Ref Range Status   MRSA by PCR NEGATIVE NEGATIVE Final    Comment:        The GeneXpert MRSA Assay (FDA approved for NASAL specimens only), is one component of a comprehensive MRSA colonization surveillance program. It is not intended to diagnose MRSA infection nor to guide or monitor treatment for MRSA infections. Performed at [facility], [place] [address] [place], [place] [address]          Radiology Studies: Dg Ankle 2 Views Right  Result Date: 11/22/2018 CLINICAL DATA:  Status post reduction for fracture/dislocation EXAM: RIGHT ANKLE - 2 VIEW COMPARISON:  Nov 22, 2018 study obtained earlier in the day FINDINGS: Frontal and lateral views obtained. Images obtained in plaster. There is a fracture the distal fibular diaphysis with mild posterior angulation distally. There is approximately 8 mm of overriding of fracture fragments. There is avulsion of the medial malleolus. Compared to study obtained earlier in the day, there is less ankle mortise disruption. The talus is no longer displaced entirely posterior to  the tibial plafond. However, there does remain mild lateral and posterior displacement of the talus with respect to the plafond and. 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 IMPRESSION: There is a persistent degree of lateral and posterior displacement of the talus with respect to the tibial plafond. The gross posterior displacement of the talus with respect to the tibial plafond seen prior to reduction is no longer evident. There are again noted fractures of the distal fibula and medial malleolar regions. No new fractures are demonstrated. There may be slightly less displacement of the distal medial malleolar fracture fragment compared to the more proximal fragment in comparison with study obtained earlier in the day. Electronically Signed   By:  Lowella Grip III M.D.   On: 11/22/2018 16:11   Dg Ankle Complete Right  Result Date: 11/22/2018 CLINICAL DATA:  Right ankle pain after fall. EXAM: RIGHT ANKLE - COMPLETE 3+ VIEW COMPARISON:  None. FINDINGS: Severely angulated and comminuted fracture of right distal fibula is noted. There is posterior dislocation of the talus relative to the tibia. Probable moderately displaced posterior malleolar fracture is noted. Moderately displaced medial malleolar fracture is noted as well. IMPRESSION: Posterior dislocation of talus relative to distal tibia with associated fractures of the distal right fibula, medial malleolus and posterior malleolus. Electronically Signed   By: Marijo Conception M.D.   On: 11/22/2018 14:48   Dg Chest Port 1 View  Result Date: 11/24/2018 CLINICAL DATA:  Hypoxia. Shortness of breath and chest pain. History of lung cancer. EXAM: PORTABLE CHEST 1 VIEW COMPARISON:  CTA chest and chest x-ray dated August 18, 2018. FINDINGS: Portions of the left lung base are excluded from the field of view. Stable cardiomegaly. Atherosclerotic calcification of the aortic arch. Coarsened interstitial markings are similar to prior study.  Postsurgical changes in the right lung again noted. No focal consolidation, pleural effusion, or pneumothorax. No acute osseous abnormality. IMPRESSION: No active disease. Stable cardiomegaly and chronic interstitial changes. Electronically Signed   By: Titus Dubin M.D.   On: 11/24/2018 10:05        Scheduled Meds:  atorvastatin  40 mg Oral Daily   docusate sodium  100 mg Oral BID   gabapentin  300 mg Oral TID   insulin aspart  0-9 Units Subcutaneous TID WC   macitentan  10 mg Oral Daily   midodrine  5 mg Oral TID WC   mometasone-formoterol  2 puff Inhalation BID   Selexipag  400 mcg Oral BID   venlafaxine XR  75 mg Oral Q breakfast   Continuous Infusions:  sodium chloride     clindamycin (CLEOCIN) IV 600 mg (11/24/18 0941)   DOPamine 3 mcg/kg/min (11/24/18 0830)   methocarbamol (ROBAXIN) IV       LOS: 2 days    Time spent: 35 minutes.     Domenic Polite, MD [facility]  11/24/2018, 11:12 AM

## 2018-11-24 NOTE — Consult Note (Signed)
..   NAME:  Amy Shepard, MRN:  [id number], DOB:  [birth year], LOS: 2 ADMISSION DATE:  11/22/2018, CONSULTATION DATE:  11/23/2018 REFERRING MD:  DUDA MD, CHIEF COMPLAINT:  Hypotension   Brief History   73 yo female presented with ankle pain and found to have distal fibular and medial malleolar fractures.  She had ORIF on 5/12.  She was hypotensive and somnolent post op, and was started on dopamine infusion.  She is followed by cardiology for pulmonary hypertension on opsumit and selexipeg.  Past Medical History  Pulmonary hypertension, COPD with emphysema, chronic hypoxic/hypercapnic respiratory failure, lung cancer, DM, Depression, Diastolic CHF, HTN, CKD 4, non obstructive CAD, orthostatic hypotension  Significant Hospital Events   5/11 admit 5/12 ORIF, to ICU with hypotension, transfuse PRBC  Consults:  Ortho  Procedures:    Significant Diagnostic Tests:  PFT 09/11/16 >> FEV1 1.45 (72%), FEV1% 87, TLC 2.95 (64%), DLCO 31% RHC 08/06/17 >> normal filling pressures, PVR 4.7, preserved CO PSG 08/27/17 >> AHI 3.8, SpO2 low 79% Echo 04/10/18 >> mild LVH, EF 55 to 60%, grade 1 DD, PAS 81 mmHg CT angio chest 08/18/18 >> atherosclerosis, small Rt effusion, b/l mosaic attentuation  Micro Data:  COVID 5/11 >> negative  Antimicrobials:  Clindamycin 5/12 >>   Interim history/subjective:  Somnolent, on Bipap.  Remains on dopamine and NO.  Objective   BP (!) 83/50   Pulse 79   Temp 99 F (37.2 C) (Axillary)   Resp 18   Ht 5' 2" (1.575 m)   Wt 82.6 kg   SpO2 93%   BMI 33.31 kg/m     Vent Mode: BIPAP FiO2 (%):  [40 %-60 %] 40 % Set Rate:  [15 bmp-20 bmp] 20 bmp PEEP:  [7 cmH20] 7 cmH20   Intake/Output Summary (Last 24 hours) at 11/24/2018 0934 Last data filed at 11/24/2018 0600 Gross per 24 hour  Intake 1550 ml  Output 1350 ml  Net 200 ml   Filed Weights   11/22/18 2140 11/23/18 1227  Weight: 82.6 kg 82.6 kg    Examination:  General - somnolent, wakes  Eyes -  pupils reactive ENT - Bipap on Cardiac - regular rate/rhythm, no murmur Chest - decreased BS, no wheeze Abdomen - soft, non tender, decreased bowel sounds Extremities - wound vac Rt leg Skin - multiple areas of ecchymosis Neuro - opens eyes with stimulation, follows commands   Assessment & Plan:   Hypotension. Discussion: Likely from anemia after surgery, volume overload in setting of cor pulmonale with compression of LV. Plan - lasix 40 mg IV x one - dopamine to keep MAP > 60; might need CVL to monitor CVP if unable to wean off pressors soon - add midodrine  Chronic hypoxic, hypercapnic respiratory failure. Discussion: Likely combination of COPD with emphysema and possible sleep related hypoventilation. Plan - Bipap as needed - oxygen to keep SpO2 90 to 95% - scheduled BDs  WHO group 2 and 3 pulmonary HTN with ?WHO group 1 pulmonary HTN. Discussion: She has been on followed by CHF team as outpt, and on opsumit and selexipag. Plan - resume opsumit and selexipag; wean off NO after starting these   Best practice:  Diet: NPO DVT prophylaxis: SCDs GI prophylaxis: not indicated Mobility: bedrest Code Status: Full Disposition: ICU  Labs    CMP Latest Ref Rng & Units 11/24/2018 11/24/2018 11/23/2018  Glucose 70 - 99 mg/dL - - -  BUN 8 - 23 mg/dL - - -  Creatinine 0.44 -  1.00 mg/dL - - -  Sodium 135 - 145 mmol/L 119(LL) 139 139  Potassium 3.5 - 5.1 mmol/L 4.2 4.3 4.4  Chloride 98 - 111 mmol/L - - -  CO2 22 - 32 mmol/L - - -  Calcium 8.9 - 10.3 mg/dL - - -  Total Protein 6.5 - 8.1 g/dL - - -  Total Bilirubin 0.3 - 1.2 mg/dL - - -  Alkaline Phos 38 - 126 U/L - - -  AST 15 - 41 U/L - - -  ALT 0 - 44 U/L - - -   CBC Latest Ref Rng & Units 11/24/2018 11/24/2018 11/24/2018  WBC 4.0 - 10.5 K/uL 7.6 - -  Hemoglobin 12.0 - 15.0 g/dL 7.9(L) 6.5(LL) 8.5(L)  Hematocrit 36.0 - 46.0 % 26.4(L) 19.0(L) 25.0(L)  Platelets 150 - 400 K/uL 164 - -   ABG    Component Value Date/Time    PHART 7.301 (L) 11/24/2018 0552   PCO2ART 63.6 (H) 11/24/2018 0552   PO2ART 56.0 (L) 11/24/2018 0552   HCO3 31.4 (H) 11/24/2018 0552   TCO2 33 (H) 11/24/2018 0552   ACIDBASEDEF 0.2 07/18/2008 0954   O2SAT 85.0 11/24/2018 0552   CBG (last 3)  Recent Labs    11/23/18 0806 11/23/18 1147 11/24/18 0829  GLUCAP 107* 108* 137*    CC time 35 minutes  Chesley Mires, MD [place] 11/24/2018, 10:06 AM

## 2018-11-24 NOTE — Progress Notes (Signed)
[facility] Progress Note Patient Name: BETTYANNE DITTMAN DOB: 06-May-1946 MRN: [id number]   Date of Service  11/24/2018  HPI/Events of Note  ABG: 7.30/63.6/56/31.4  eICU Interventions  FiO2 increased to 0.60 on current BIPAP settings.        Kerry Kass Ogan 11/24/2018, 6:07 AM

## 2018-11-24 NOTE — Social Work (Signed)
CSW acknowledging consult for SNF placement. Will follow for therapy recommendations.  Pt currently on bipap and not medically stable for evaluations.   Westley Hummer, MSW, [facility] [phone]

## 2018-11-24 NOTE — Progress Notes (Signed)
eLink Physician-Brief Progress Note Patient Name: Amy Shepard DOB: [birth year] MRN: [id number]   Date of Service  11/24/2018  HPI/Events of Note  Acute urinary retention.  eICU Interventions  Order for foley catheter entered.        Kerry Kass Kennethia Lynes 11/24/2018, 12:17 AM

## 2018-11-24 NOTE — Progress Notes (Addendum)
Follow up: Notified by Elta Guadeloupe, RN in PACU regarding pt's post-op status. He reports pt was following an initially uneventful recovery period except for slightly soft BP's. At approx 1830 she had been awake and oriented and able to converse. She then had an episode of unresponsiveness. She did not loose her pulse but was not breathing and  required BVM assisted ventilations. She also became persistently hypotensive w/ SBP's in the 60's. Anesthesia team responded and she ultimately required dopamine drip. Discussed event w/ Dr. Ola Spurr w/ anesthesia team. Her BP's did improve somewhat but she did not tolerate efforts to wean her off of the Dopamine drip x 2. At the time of my assessment at bedside in PACU pt was still lethargic but awakens easily. She is oriented to self and place and is aware today is her birthday. SBP's now in the 90's but still having to titrate Dopamine up. Maintaining 02 sats at > 90-92% on NRB. Labs were ordered and Hb noted to be 6.9. One unit of PRBC's have been ordered.  Assessment/Plan: 1. Acute on chronic hypoxic respiratory failure: Multifactorial. S/p anesthesia for ORIF (R) ankle. Pt also w/ h/o COPD on 4L home 02. 2. Persistent hypotension: Multifactorial. S/p anesthesia, h/o severe pulmonary HTN.  Improved w/ Dopamine drip but not tolerating attempts at weaning off. Drop in Hb post-op from 8.2 to 6.8. PRBC's pending. Discussed pt w/ Dr Lucile Shutters w/ [facility] who has agreed to have pt evaluated in PACU by [facility] provider as pt will need ICU observation while on vasopressors. Appreciate PCCM input and management. Will follow.   Jeryl Columbia, NP-C [facility] Pager [phone]  CRITICAL CARE Performed by: Jeryl Columbia   Total critical care time: 45 minutes  Critical care time was exclusive of separately billable procedures and treating other patients.  Critical care was necessary to treat or prevent imminent or life-threatening deterioration.  Critical care was  time spent personally by me on the following activities: development of treatment plan with patient and/or surrogate as well as nursing, discussions with consultants, evaluation of patient's response to treatment, examination of patient, obtaining history from patient or surrogate, ordering and performing treatments and interventions, ordering and review of laboratory studies, ordering and review of radiographic studies, pulse oximetry and re-evaluation of patient's condition.

## 2018-11-24 NOTE — Progress Notes (Signed)
0850 Pt on bipap 60% sats WNL. Dr. Broadus John wanted to titrate down to 40% advised respiratory and bipap titrated to 40%. Pt sats 88% maintained for 5 mins.  0856 Pt desats to 82%. Pt increased back to 60% after o2 breaths given. sats WNL. Paged MD made aware and Dr. Halford Chessman made awar on rounds. Pt follows commands.  Will continue to monitor.

## 2018-11-24 NOTE — Progress Notes (Signed)
Pt desat to 74% while off bipap to take oral medications. Pt remained alert. sats improved to 95 after bipap placed back on.

## 2018-11-24 NOTE — Progress Notes (Signed)
Dr. Halford Chessman and Dr. Broadus John ok with pt being taken off bipap for home meds dispensed by pharmacy. Advised to take small sip with meds and if pt unable to tolerate off bipap place back on bipap. Pt desated to 70% within 3 mins of taking pills. Pt remains alert, oriented x4. 60% Bipap placed and sats increased 90%. Will continue to monitor.

## 2018-11-25 ENCOUNTER — Encounter (HOSPITAL_COMMUNITY): Payer: Self-pay | Admitting: Orthopedic Surgery

## 2018-11-25 ENCOUNTER — Inpatient Hospital Stay (HOSPITAL_COMMUNITY): Payer: Medicare Other

## 2018-11-25 DIAGNOSIS — J95822 Acute and chronic postprocedural respiratory failure: Secondary | ICD-10-CM

## 2018-11-25 DIAGNOSIS — I9581 Postprocedural hypotension: Secondary | ICD-10-CM

## 2018-11-25 LAB — COMPREHENSIVE METABOLIC PANEL
ALT: 11 U/L (ref 0–44)
AST: 20 U/L (ref 15–41)
Albumin: 3.1 g/dL — ABNORMAL LOW (ref 3.5–5.0)
Alkaline Phosphatase: 125 U/L (ref 38–126)
Anion gap: 11 (ref 5–15)
BUN: 27 mg/dL — ABNORMAL HIGH (ref 8–23)
CO2: 28 mmol/L (ref 22–32)
Calcium: 9.4 mg/dL (ref 8.9–10.3)
Chloride: 102 mmol/L (ref 98–111)
Creatinine, Ser: 1.6 mg/dL — ABNORMAL HIGH (ref 0.44–1.00)
GFR calc Af Amer: 37 mL/min — ABNORMAL LOW (ref >60)
GFR calc non Af Amer: 32 mL/min — ABNORMAL LOW (ref >60)
Glucose, Bld: 159 mg/dL — ABNORMAL HIGH (ref 70–99)
Potassium: 4 mmol/L (ref 3.5–5.1)
Sodium: 141 mmol/L (ref 135–145)
Total Bilirubin: 0.8 mg/dL (ref 0.3–1.2)
Total Protein: 6.1 g/dL — ABNORMAL LOW (ref 6.5–8.1)

## 2018-11-25 LAB — CBC
HCT: 27.2 % — ABNORMAL LOW (ref 36.0–46.0)
Hemoglobin: 8.4 g/dL — ABNORMAL LOW (ref 12.0–15.0)
MCH: 23.9 pg — ABNORMAL LOW (ref 26.0–34.0)
MCHC: 30.9 g/dL (ref 30.0–36.0)
MCV: 77.5 fL — ABNORMAL LOW (ref 80.0–100.0)
Platelets: 161 10*3/uL (ref 150–400)
RBC: 3.51 MIL/uL — ABNORMAL LOW (ref 3.87–5.11)
RDW: 17.7 % — ABNORMAL HIGH (ref 11.5–15.5)
WBC: 7.8 10*3/uL (ref 4.0–10.5)
nRBC: 0 % (ref 0.0–0.2)

## 2018-11-25 LAB — GLUCOSE, CAPILLARY
Glucose-Capillary: 141 mg/dL — ABNORMAL HIGH (ref 70–99)
Glucose-Capillary: 138 mg/dL — ABNORMAL HIGH (ref 70–99)
Glucose-Capillary: 148 mg/dL — ABNORMAL HIGH (ref 70–99)
Glucose-Capillary: 233 mg/dL — ABNORMAL HIGH (ref 70–99)
Glucose-Capillary: 182 mg/dL — ABNORMAL HIGH (ref 70–99)

## 2018-11-25 MED ORDER — DEXMEDETOMIDINE HCL IN NACL 200 MCG/50ML IV SOLN: 0.2000 ug/kg/h | INTRAVENOUS | Status: DC

## 2018-11-25 MED ORDER — FUROSEMIDE 10 MG/ML IJ SOLN
40.0000 mg | Freq: Once | INTRAMUSCULAR | Status: AC
  Administered 2018-11-25: 40 mg via INTRAVENOUS
  Filled 2018-11-25: qty 4

## 2018-11-25 NOTE — Progress Notes (Signed)
..   NAME:  Amy Shepard, MRN:  [id number], DOB:  08-26-1945, LOS: 3 ADMISSION DATE:  11/22/2018, CONSULTATION DATE:  11/23/2018 REFERRING MD:  DUDA MD, CHIEF COMPLAINT:  Hypotension   Brief History   73 yo female presented with ankle pain and found to have distal fibular and medial malleolar fractures.  She had ORIF on 5/12.  She was hypotensive and somnolent post op, and was started on dopamine infusion.  She is followed by cardiology for pulmonary hypertension on opsumit and selexipeg.  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, DM, Depression, Diastolic CHF, HTN, CKD 4, non obstructive CAD, orthostatic hypotension  Significant Hospital Events   5/11 admit 5/12 ORIF, to ICU with hypotension, transfuse PRBC  Consults:  Ortho  Procedures:    Significant Diagnostic Tests:  PFT 09/11/16 >> FEV1 1.45 (72%), FEV1% 87, TLC 2.95 (64%), DLCO 31% RHC 08/06/17 >> normal filling pressures, PVR 4.7, preserved CO PSG 08/27/17 >> AHI 3.8, SpO2 low 79% Echo 04/10/18 >> mild LVH, EF 55 to 60%, grade 1 DD, PAS 81 mmHg CT angio chest 08/18/18 >> atherosclerosis, small Rt effusion, b/l mosaic attentuation  Micro Data:  COVID 5/11 >> negative  Antimicrobials:  Clindamycin 5/12 >>   Interim history/subjective:   Agitation overnight and ordered for Precedex but not started Off dopamine this morning. Ripped BiPAP mask off this morning and desaturated, now back on BiPAP  Objective   BP (!) 79/55   Pulse 81   Temp 98.2 F (36.8 C) (Oral)   Resp (!) 24   Ht _0  (1.575 m)   Wt 82.6 kg   SpO2 98%   BMI 33.31 kg/m     Vent Mode: BIPAP FiO2 (%):  [40 %-60 %] 50 % Set Rate:  [20 bmp] 20 bmp Vt Set:  [574 mL] 574 mL PEEP:  [7 cmH20] 7 cmH20   Intake/Output Summary (Last 24 hours) at 11/25/2018 0813 Last data filed at 11/25/2018 0700 Gross per 24 hour  Intake 317.3  ml  Output 1525 ml  Net -1207.7 ml   Filed Weights   11/22/18 2140 11/23/18 1227  Weight: 82.6 kg 82.6 kg    Examination:  General -elderly woman, on BiPAP full facemask, no distress, able to speak in full sentences ENT -mild pallor, no JVD, no icterus Cardiac - regular rate/rhythm, no murmur Chest - decreased BS, no wheeze Abdomen - soft, non tender, decreased bowel sounds Extremities - wound vac Rt leg Skin - multiple areas of ecchymosis Neuro -alert, interactive, nonfocal, follows commands   Assessment & Plan:   Hypotension -resolved Discussion: Likely from anemia after surgery, volume overload in setting of cor pulmonale with compression of LV. Plan -Repeat Lasix 40 mg for diuresis - dopamine off - ct midodrine  Acute on chronic hypoxic, hypercapnic respiratory failure. Discussion: Likely combination of emphysema/restrictive lung disease and possible sleep related hypoventilation. Plan - Bipap as needed, give breaks for at least 4 to 6 hours on oxygen - oxygen to keep SpO2 90 to 92% - scheduled BDs  WHO group 2 and 3 pulmonary HTN with ?WHO group 1 pulmonary HTN. Discussion: She has been on followed by CHF team as outpt, and on opsumit and selexipag. Plan - resume opsumit and selexipag;  off NO    Best practice:  Diet: NPO DVT prophylaxis: SCDs GI prophylaxis: not indicated Mobility: bedrest Code Status: Full Disposition: ICU   Davonna Belling  Elsworth Soho MD. [place] Flood. [facility] Pager [phone] If no response call [phone]     11/25/2018, 8:13 AM

## 2018-11-25 NOTE — Plan of Care (Signed)
Progressing on goals at this time.

## 2018-11-25 NOTE — Progress Notes (Signed)
PROGRESS NOTE    ROSEMARIE GALVIS  POE:[id number] DOB: June 30, 1946 DOA: 11/22/2018 PCP: Maurice Small, MD    Brief Narrative: 73 year old female with history of COPD/chronic respiratory failure on 4 L O2, severe pulmonary hypertension, gait disorder, chronic diastolic CHF, diabetes, hypertension, stage III CKD, CAD presented to the ED following a mechanical fall she was diagnosed with a right ankle fracture which was reduced under conscious sedation and splinted, orthopedics was consulted, underwent ORIF of right ankle 5/12 Postop course complicated by worsening hypoxia and hypotension requiring dopamine and BiPAP:   Assessment & Plan:   1-right ankle fracture: -Seen by orthopedics Dr. Sharol Given underwent ORIF yesterday 5/12 -Postop course complicated by hypotension, hypoxia and anemia -Recommended to continue wound VAC for 1 week, nonweightbearing for 3 weeks and follow-up in 1 week -PT eval soon, stable from the standpoint  2.  Acute on chronic hypoxic respiratory failure -Patient has advanced COPD with chronic respiratory failure requiring 4 L O2 and severe pulmonary hypertension at baseline -Required BiPAP until this morning, now off -Wean O2, IV Lasix x2 sets today -Restarted home pulmonary hypertension meds -Chest x-ray appears unremarkable  3.  Acute on chronic hypotension -Postop likely secondary to blood loss anemia, and pulmonary hypertension/mild volume overload, nitric oxide -PCCM following, weaned off dopamine -Midodrine restarted -Weaned off nitric oxide, restarted pulmonary hypertension meds BP more stable  4.  Severe chronic pulmonary hypertension -needs CODE STATUS discussion -Restarted selexipag and macitentan,   5.  Acute on chronic diastolic CHF -IV Lasix x2 doses today -Goal to keep dry while we wean down O2  6-COPD with chronic hypoxic respiratory failure: On 4 L home O2  -As above  7-type 2 diabetes with hyperglycemia: -Long-acting insulin on hold, CBG  stable, resume sliding scale insulin -Resume long-acting insulin when oral intake restarted  Stage III chronic kidney disease: Creatinine baseline; 1.2--- 1.3. Continue to monitor closely  DVT prophylaxis: Add subcutaneous heparin Code Status: Full code Family Communication: No family at bedside discussed with the patient will update family later today Disposition Plan: May need rehab  Consultants:   Dr. Sharol Given   Procedures:  None   Antimicrobials:   Clindamycin preop   Subjective: -Feels better overall, breathing improving, off BiPAP this morning, blood pressure better as well  Objective: Vitals:   11/25/18 0842 11/25/18 0900 11/25/18 1000 11/25/18 1200  BP:  123/68 (!) 127/48 (!) 96/53  Pulse: 86 87 89 91  Resp: (!) 23 18 (!) 22 18  Temp:    98.1 F (36.7 C)  TempSrc:    Axillary  SpO2: 90% 91% 91% (!) 89%  Weight:      Height:        Intake/Output Summary (Last 24 hours) at 11/25/2018 1304 Last data filed at 11/25/2018 1200 Gross per 24 hour  Intake 493.77 ml  Output 1125 ml  Net -631.23 ml   Filed Weights   11/22/18 2140 11/23/18 1227  Weight: 82.6 kg 82.6 kg    Examination:  Gen: Chronically ill female, awake alert oriented x3 HEENT: PERRLA, Neck supple, no JVD Lungs: Poor air movement bilaterally, fine crackles at the left base CVS: RRR,No Gallops,Rubs or new Murmurs Abd: soft, Non tender, non distended, BS present Extremities: No edema, right ankle with wound VAC Skin: no new rashes  Data Reviewed: I have personally reviewed following labs and imaging studies  CBC: Recent Labs  Lab 11/22/18 1615 11/23/18 0715 11/23/18 1913 11/23/18 2157 11/24/18 0228 11/24/18 0552 11/24/18 0804 11/25/18 0451  WBC  7.3 7.1 7.4  --   --   --  7.6 7.8  NEUTROABS 5.3  --   --   --   --   --   --   --   HGB 8.2* 8.2* 6.9* 8.5* 8.5* 6.5* 7.9* 8.4*  HCT 28.0* 27.5* 24.4* 25.0* 25.0* 19.0* 26.4* 27.2*  MCV 79.3* 76.8* 80.0  --   --   --  79.3* 77.5*  PLT  205 191 192  --   --   --  164 793   Basic Metabolic Panel: Recent Labs  Lab 11/22/18 1615 11/23/18 0715 11/23/18 1913 11/23/18 2157 11/24/18 0228 11/24/18 0552 11/24/18 1333 11/25/18 0451  NA 140 139 139 139 139 119* 138 141  K 3.4* 4.0 3.9 4.4 4.3 4.2 4.1 4.0  CL 99 98 102  --   --   --  100 102  CO2 32 30 26  --   --   --  29 28  GLUCOSE 68* 111* 204*  --   --   --  137* 159*  BUN 29* 25* 25*  --   --   --  26* 27*  CREATININE 1.43* 1.51* 1.45*  --   --   --  1.57* 1.60*  CALCIUM 9.3 9.1 8.8*  --   --   --  8.8* 9.4   GFR: Estimated Creatinine Clearance: 31.7 mL/min (A) (by C-G formula based on SCr of 1.6 mg/dL (H)). Liver Function Tests: Recent Labs  Lab 11/25/18 0451  AST 20  ALT 11  ALKPHOS 125  BILITOT 0.8  PROT 6.1*  ALBUMIN 3.1*   No results for input(s): LIPASE, AMYLASE in the last 168 hours. No results for input(s): AMMONIA in the last 168 hours. Coagulation Profile: No results for input(s): INR, PROTIME in the last 168 hours. Cardiac Enzymes: No results for input(s): CKTOTAL, CKMB, CKMBINDEX, TROPONINI in the last 168 hours. BNP (last 3 results) No results for input(s): PROBNP in the last 8760 hours. HbA1C: No results for input(s): HGBA1C in the last 72 hours. CBG: Recent Labs  Lab 11/24/18 1952 11/24/18 2354 11/25/18 0403 11/25/18 0712 11/25/18 1110  GLUCAP 119* 149* 141* 138* 148*   Lipid Profile: No results for input(s): CHOL, HDL, LDLCALC, TRIG, CHOLHDL, LDLDIRECT in the last 72 hours. Thyroid Function Tests: No results for input(s): TSH, T4TOTAL, FREET4, T3FREE, THYROIDAB in the last 72 hours. Anemia Panel: No results for input(s): VITAMINB12, FOLATE, FERRITIN, TIBC, IRON, RETICCTPCT in the last 72 hours. Sepsis Labs: No results for input(s): PROCALCITON, LATICACIDVEN in the last 168 hours.  Recent Results (from the past 240 hour(s))  SARS Coronavirus 2 (CEPHEID - Performed in [facility] hospital lab), Hosp Order     Status: None    Collection Time: 11/22/18  5:34 PM  Result Value Ref Range Status   SARS Coronavirus 2 NEGATIVE NEGATIVE Final    Comment: (NOTE) If result is NEGATIVE SARS-CoV-2 target nucleic acids are NOT DETECTED. The SARS-CoV-2 RNA is generally detectable in upper and lower  respiratory specimens during the acute phase of infection. The lowest  concentration of SARS-CoV-2 viral copies this assay can detect is 250  copies / mL. A negative result does not preclude SARS-CoV-2 infection  and should not be used as the sole basis for treatment or other  patient management decisions.  A negative result may occur with  improper specimen collection / handling, submission of specimen other  than nasopharyngeal swab, presence of viral mutation(s) within the  areas targeted by this assay, and inadequate number of viral copies  (<250 copies / mL). A negative result must be combined with clinical  observations, patient history, and epidemiological information. If result is POSITIVE SARS-CoV-2 target nucleic acids are DETECTED. The SARS-CoV-2 RNA is generally detectable in upper and lower  respiratory specimens dur ing the acute phase of infection.  Positive  results are indicative of active infection with SARS-CoV-2.  Clinical  correlation with patient history and other diagnostic information is  necessary to determine patient infection status.  Positive results do  not rule out bacterial infection or co-infection with other viruses. If result is PRESUMPTIVE POSTIVE SARS-CoV-2 nucleic acids MAY BE PRESENT.   A presumptive positive result was obtained on the submitted specimen  and confirmed on repeat testing.  While 2019 novel coronavirus  (SARS-CoV-2) nucleic acids may be present in the submitted sample  additional confirmatory testing may be necessary for epidemiological  and / or clinical management purposes  to differentiate between  SARS-CoV-2 and other Sarbecovirus currently known to infect humans.   If clinically indicated additional testing with an alternate test  methodology [phone]) is advised. The SARS-CoV-2 RNA is generally  detectable in upper and lower respiratory sp ecimens during the acute  phase of infection. The expected result is Negative. Fact Sheet for Patients:  StrictlyIdeas.no Fact Sheet for Healthcare Providers: BankingDealers.co.za This test is not yet approved or cleared by the [place] FDA and has been authorized for detection and/or diagnosis of SARS-CoV-2 by FDA under an Emergency Use Authorization (EUA).  This EUA will remain in effect (meaning this test can be used) for the duration of the COVID-19 declaration under Section 564(b)(1) of the Act, 21 U.S.C. section 360bbb-3(b)(1), unless the authorization is terminated or revoked sooner. Performed at [facility], [place] [address] [place], [place] [address]   MRSA PCR Screening     Status: None   Collection Time: 11/22/18 11:35 PM  Result Value Ref Range Status   MRSA by PCR NEGATIVE NEGATIVE Final    Comment:        The GeneXpert MRSA Assay (FDA approved for NASAL specimens only), is one component of a comprehensive MRSA colonization surveillance program. It is not intended to diagnose MRSA infection nor to guide or monitor treatment for MRSA infections. Performed at [facility], [place] [address] [place], [place] [address]          Radiology Studies: Dg Chest [place] 1 View  Result Date: 11/25/2018 CLINICAL DATA:  73 year old female with history of lung cancer. Fall. Shortness of breath. EXAM: PORTABLE CHEST 1 VIEW COMPARISON:  11/24/2018 and earlier. FINDINGS: Portable AP semi upright view at 0516 hours. Stable lung volumes. Stable cardiomegaly and mediastinal contours. Calcified aortic atherosclerosis. Visualized tracheal air column is within normal limits. Chronic postoperative changes in the right lower lung. Chronic  increased pulmonary interstitial markings, mildly decreased coarse interstitial opacity since yesterday. Ventilation now appears to be at baseline including in the left lower lobe where the hemidiaphragm is again visible. No pneumothorax or pleural effusion. No acute osseous abnormality identified. IMPRESSION: 1. Improved ventilation since yesterday, lungs now appear at baseline. 2. Cardiomegaly.  Aortic Atherosclerosis (ICD10-I70.0). Electronically Signed   By: Genevie Ann M.D.   On: 11/25/2018 08:14   Dg Chest Port 1 View  Result Date: 11/24/2018 CLINICAL DATA:  Hypoxia. Shortness of breath and chest pain. History of lung cancer. EXAM: PORTABLE CHEST 1 VIEW COMPARISON:  CTA chest and chest x-ray  dated August 18, 2018. FINDINGS: Portions of the left lung base are excluded from the field of view. Stable cardiomegaly. Atherosclerotic calcification of the aortic arch. Coarsened interstitial markings are similar to prior study. Postsurgical changes in the right lung again noted. No focal consolidation, pleural effusion, or pneumothorax. No acute osseous abnormality. IMPRESSION: No active disease. Stable cardiomegaly and chronic interstitial changes. Electronically Signed   By: Titus Dubin M.D.   On: 11/24/2018 10:05        Scheduled Meds: . atorvastatin  40 mg Oral Daily  . chlorhexidine  15 mL Mouth Rinse BID  . docusate sodium  100 mg Oral BID  . furosemide  40 mg Intravenous Once  . gabapentin  300 mg Oral QHS  . insulin aspart  0-9 Units Subcutaneous TID WC  . macitentan  10 mg Oral Daily  . mouth rinse  15 mL Mouth Rinse q12n4p  . midodrine  5 mg Oral TID WC  . mometasone-formoterol  2 puff Inhalation BID  . Selexipag  400 mcg Oral BID  . venlafaxine XR  75 mg Oral Q breakfast   Continuous Infusions: . sodium chloride Stopped (11/24/18 2122)  . methocarbamol (ROBAXIN) IV       LOS: 3 days    Time spent: 35 minutes.     Domenic Polite, MD [facility]  11/25/2018, 1:04  PM

## 2018-11-25 NOTE — Progress Notes (Signed)
Patient ID: Amy Shepard, female   DOB: 1945-11-24, 73 y.o.   MRN: [id number] Patient's dressing clean and dry no drainage she can move her ankle and toes without problems.  No drainage in the wound VAC canister.  Anticipate discharge to skilled nursing with the portable [place] wound VAC.

## 2018-11-25 NOTE — Progress Notes (Signed)
Rehab Admissions Coordinator Note:  Patient was screened by Cleatrice Burke for appropriateness for an Inpatient Acute Rehab Consult per PT recs.  At this time, we are recommending Inpatient Rehab consult.  Cleatrice Burke RN MSN 11/25/2018, 5:18 PM  I can be reached at [phone].

## 2018-11-25 NOTE — Evaluation (Signed)
Physical Therapy Evaluation Patient Details Name: Amy Shepard MRN: [id number] DOB: [birth year] Today's Date: 11/25/2018   History of Present Illness  Pt is a 73 y.o. F with significant PMH of COPD/chronic respiratory failure, severe pulmonary hypertension, chronic diastolic CHF, diabetes, stage III CKD, CAD who presents following a fall and a right trimalleolar ankle fracture. Now s/p ORIF 5/13   Clinical Impression  Pt admitted with above diagnosis. Pt currently with functional limitations due to the deficits listed below (see PT Problem List). Prior to admission, pt lives alone, ambulates with a Rollator and is independent with ADL's. On PT evaluation, pt performing all functional mobility at a min assist level. Able to transfer on and off the bedside commode using a walker. Transfer to chair deferred by RN as pt recently came of BIPAP. SpO2 88-95% on 15L O2 via HFNC. Pt with good RLE strength and suspect will progress well. Recommend CIR to maximize functional independence to progress transfer and gait training in addition to strengthening.       Follow Up Recommendations CIR;Supervision for mobility/OOB    Equipment Recommendations  Wheelchair (measurements PT)    Recommendations for Other Services OT consult     Precautions / Restrictions Precautions Precautions: Fall Required Braces or Orthoses: Other Brace Other Brace: R CAM boot Restrictions Weight Bearing Restrictions: Yes RLE Weight Bearing: Non weight bearing      Mobility  Bed Mobility Overal bed mobility: Needs Assistance Bed Mobility: Supine to Sit;Sit to Supine     Supine to sit: Min guard Sit to supine: Min guard   General bed mobility comments: Min guard for safety  Transfers Overall transfer level: Needs assistance Equipment used: Rolling walker (2 wheeled) Transfers: Sit to/from [facility] Sit to Stand: Min assist Stand pivot transfers: Min assist       General transfer  comment: Min assist for standing from edge of bed and stand pivot transfer from bed <> BSC. cues for sequencing and maintaining weightbearing precautions.   Ambulation/Gait                Stairs            Wheelchair Mobility    Modified Rankin (Stroke Patients Only)       Balance Overall balance assessment: Needs assistance Sitting-balance support: Feet supported Sitting balance-Leahy Scale: Good     Standing balance support: Bilateral upper extremity supported Standing balance-Leahy Scale: Poor Standing balance comment: reliant on external support                             Pertinent Vitals/Pain Pain Assessment: No/denies pain    Home Living Family/patient expects to be discharged to:: Private residence Living Arrangements: Alone Available Help at Discharge: Family;Available PRN/intermittently Type of Home: Apartment Home Access: Level entry     Home Layout: One level Home Equipment: Walker - 4 wheels;Shower seat      Prior Function Level of Independence: Independent with assistive device(s)         Comments: uses Rollator, independent with ADL's     Hand Dominance   Dominant Hand: Right    Extremity/Trunk Assessment   Upper Extremity Assessment Upper Extremity Assessment: Overall WFL for tasks assessed    Lower Extremity Assessment Lower Extremity Assessment: RLE deficits/detail RLE Deficits / Details: s/p ORIF. At least anti gravity strength       Communication   Communication: No difficulties  Cognition Arousal/Alertness: Awake/alert Behavior During Therapy:  WFL for tasks assessed/performed Overall Cognitive Status: Within Functional Limits for tasks assessed                                        General Comments      Exercises General Exercises - Lower Extremity Ankle Circles/Pumps: 5 reps;Right;Supine Heel Slides: 5 reps;Right;Supine Straight Leg Raises: 5 reps;Right;Supine    Assessment/Plan    PT Assessment Patient needs continued PT services  PT Problem List Decreased strength;Decreased activity tolerance;Decreased balance;Decreased mobility;Decreased knowledge of precautions;Cardiopulmonary status limiting activity       PT Treatment Interventions DME instruction;Gait training;Functional mobility training;Therapeutic activities;Therapeutic exercise;Balance training;Patient/family education    PT Goals (Current goals can be found in the Care Plan section)  Acute Rehab PT Goals Patient Stated Goal: "stay here to get rehab." PT Goal Formulation: With patient Time For Goal Achievement: 12/09/18 Potential to Achieve Goals: Good    Frequency Min 5X/week   Barriers to discharge Decreased caregiver support      Co-evaluation               AM-PAC PT "6 Clicks" Mobility  Outcome Measure Help needed turning from your back to your side while in a flat bed without using bedrails?: None Help needed moving from lying on your back to sitting on the side of a flat bed without using bedrails?: A Little Help needed moving to and from a bed to a chair (including a wheelchair)?: A Little Help needed standing up from a chair using your arms (e.g., wheelchair or bedside chair)?: A Little Help needed to walk in hospital room?: A Lot Help needed climbing 3-5 steps with a railing? : A Lot 6 Click Score: 17    End of Session Equipment Utilized During Treatment: Other (comment);Oxygen(R CAM boot) Activity Tolerance: Patient tolerated treatment well Patient left: in bed;with call bell/phone within reach Nurse Communication: Mobility status PT Visit Diagnosis: Unsteadiness on feet (R26.81);Other abnormalities of gait and mobility (R26.89);History of falling (Z91.81)    Time: 4665-9935 PT Time Calculation (min) (ACUTE ONLY): 39 min   Charges:   PT Evaluation $PT Eval Moderate Complexity: 1 Mod PT Treatments $Gait Training: 8-22 mins $Therapeutic Activity:  8-22 mins        Ellamae Sia, PT, DPT Acute Rehabilitation Services Pager [phone] Office [phone]   Willy Eddy 11/25/2018, 4:59 PM

## 2018-11-25 NOTE — Progress Notes (Signed)
[facility] Progress Note Patient Name: Amy Shepard DOB: [birth year] MRN: [id number]   Date of Service  11/25/2018  HPI/Events of Note  Agitation on BIPAP with pt pulling off mask  eICU Interventions  Precedex infusion at 0.2-0.8 mcg prn agitation        Frederik Pear 11/25/2018, 4:58 AM

## 2018-11-25 NOTE — Progress Notes (Signed)
Patient refused BiPAP at this time. At bedside, if she changes her mind.

## 2018-11-26 LAB — BASIC METABOLIC PANEL
Anion gap: 9 (ref 5–15)
BUN: 23 mg/dL (ref 8–23)
CO2: 31 mmol/L (ref 22–32)
Calcium: 9 mg/dL (ref 8.9–10.3)
Chloride: 100 mmol/L (ref 98–111)
Creatinine, Ser: 1.36 mg/dL — ABNORMAL HIGH (ref 0.44–1.00)
GFR calc Af Amer: 45 mL/min — ABNORMAL LOW (ref >60)
GFR calc non Af Amer: 39 mL/min — ABNORMAL LOW (ref >60)
Glucose, Bld: 149 mg/dL — ABNORMAL HIGH (ref 70–99)
Potassium: 3.3 mmol/L — ABNORMAL LOW (ref 3.5–5.1)
Sodium: 140 mmol/L (ref 135–145)

## 2018-11-26 LAB — GLUCOSE, CAPILLARY
Glucose-Capillary: 136 mg/dL — ABNORMAL HIGH (ref 70–99)
Glucose-Capillary: 153 mg/dL — ABNORMAL HIGH (ref 70–99)
Glucose-Capillary: 162 mg/dL — ABNORMAL HIGH (ref 70–99)
Glucose-Capillary: 203 mg/dL — ABNORMAL HIGH (ref 70–99)

## 2018-11-26 LAB — CBC
HCT: 27.4 % — ABNORMAL LOW (ref 36.0–46.0)
Hemoglobin: 8.2 g/dL — ABNORMAL LOW (ref 12.0–15.0)
MCH: 23.6 pg — ABNORMAL LOW (ref 26.0–34.0)
MCHC: 29.9 g/dL — ABNORMAL LOW (ref 30.0–36.0)
MCV: 78.7 fL — ABNORMAL LOW (ref 80.0–100.0)
Platelets: 177 10*3/uL (ref 150–400)
RBC: 3.48 MIL/uL — ABNORMAL LOW (ref 3.87–5.11)
RDW: 18 % — ABNORMAL HIGH (ref 11.5–15.5)
WBC: 8.6 10*3/uL (ref 4.0–10.5)
nRBC: 0 % (ref 0.0–0.2)

## 2018-11-26 LAB — PHOSPHORUS: Phosphorus: 2.8 mg/dL (ref 2.5–4.6)

## 2018-11-26 LAB — MAGNESIUM: Magnesium: 2 mg/dL (ref 1.7–2.4)

## 2018-11-26 MED ORDER — DM-GUAIFENESIN ER 30-600 MG PO TB12
2.0000 | ORAL_TABLET | Freq: Two times a day (BID) | ORAL | Status: DC
  Administered 2018-11-26 – 2018-12-01 (×10): 2 via ORAL
  Filled 2018-11-26 (×10): qty 2

## 2018-11-26 MED ORDER — POTASSIUM CHLORIDE CRYS ER 20 MEQ PO TBCR
40.0000 meq | EXTENDED_RELEASE_TABLET | Freq: Once | ORAL | Status: AC
  Administered 2018-11-26: 40 meq via ORAL
  Filled 2018-11-26: qty 2

## 2018-11-26 MED ORDER — FUROSEMIDE 10 MG/ML IJ SOLN
40.0000 mg | Freq: Once | INTRAMUSCULAR | Status: AC
  Administered 2018-11-26: 12:00:00 40 mg via INTRAVENOUS
  Filled 2018-11-26: qty 4

## 2018-11-26 MED ORDER — FUROSEMIDE 10 MG/ML IJ SOLN
40.0000 mg | Freq: Once | INTRAMUSCULAR | Status: AC
  Administered 2018-11-26: 40 mg via INTRAVENOUS
  Filled 2018-11-26: qty 4

## 2018-11-26 MED ORDER — GABAPENTIN 300 MG PO CAPS
300.0000 mg | ORAL_CAPSULE | Freq: Three times a day (TID) | ORAL | Status: DC
  Administered 2018-11-26 – 2018-12-01 (×16): 300 mg via ORAL
  Filled 2018-11-26 (×16): qty 1

## 2018-11-26 MED ORDER — HEPARIN SODIUM (PORCINE) 5000 UNIT/ML IJ SOLN
5000.0000 [IU] | Freq: Three times a day (TID) | INTRAMUSCULAR | Status: DC
  Administered 2018-11-26 – 2018-12-01 (×17): 5000 [IU] via SUBCUTANEOUS
  Filled 2018-11-26 (×16): qty 1

## 2018-11-26 NOTE — Progress Notes (Signed)
..  NAME:  Amy Shepard, MRN:  [id number], DOB:  September 29, 1945, LOS: 4 ADMISSION DATE:  11/22/2018, CONSULTATION DATE:  11/23/2018 REFERRING MD:  DUDA MD, CHIEF COMPLAINT:  Hypotension   Brief History   73 yo female presented with ankle pain and found to have distal fibular and medial malleolar fractures.  She had ORIF on 5/12.  She was hypotensive and somnolent post op, and was started on dopamine infusion.  She is followed by cardiology for pulmonary hypertension on opsumit and selexipeg.  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, DM, Depression, Diastolic CHF, HTN, CKD 4, non obstructive CAD, orthostatic hypotension  Significant Hospital Events   5/11 admit 5/12 ORIF, to ICU with hypotension, transfuse PRBC 5/13 NO weaned off   Consults:  Ortho  Procedures:    Significant Diagnostic Tests:  PFT 09/11/16 >> FEV1 1.45 (72%), FEV1% 87, TLC 2.95 (64%), DLCO 31% RHC 08/06/17 >> normal filling pressures, PVR 4.7, preserved CO PSG 08/27/17 >> AHI 3.8, SpO2 low 79% Echo 04/10/18 >> mild LVH, EF 55 to 60%, grade 1 DD, PAS 81 mmHg CT angio chest 08/18/18 >> atherosclerosis, small Rt effusion, b/l mosaic attentuation  Micro Data:  COVID 5/11 >> negative  Antimicrobials:  Clindamycin 5/12 >>   Interim history/subjective:   Diuresed well with Lasix Refused BiPAP Remains hypoxic now requiring high flow nasal cannula 15 L  Objective   BP (!) 137/58   Pulse 92   Temp 98.2 F (36.8 C) (Oral)   Resp 18   Ht 5' 2" (1.575 m)   Wt 82.6 kg   SpO2 93%   BMI 33.31 kg/m         Intake/Output Summary (Last 24 hours) at 11/26/2018 0831 Last data filed at 11/26/2018 0600 Gross per 24 hour  Intake 490 ml  Output 2200 ml  Net -1710 ml   Filed Weights   11/22/18 2140 11/23/18 1227  Weight: 82.6 kg 82.6 kg    Examination:  General -elderly woman, no distress,  able to converse in full sentences ENT -mild pallor, no JVD, no icterus Cardiac - regular rate/rhythm, no murmur Chest - decreased BS, no wheeze Abdomen - soft, non tender, decreased bowel sounds Extremities - wound vac Rt leg Skin - multiple areas of ecchymosis Neuro -alert, interactive, nonfocal, follows commands    Chest x-ray 5/14 personally reviewed which shows improved aeration both lower lobes  Assessment & Plan:   Hypotension -resolved Discussion: Likely from anemia after surgery, volume overload in setting of cor pulmonale with compression of LV. Plan -Repeat Lasix 40 mg for diuresis, replete hypokalemia - dopamine off - taper midodrine to off  Acute on chronic hypoxic, hypercapnic respiratory failure. Discussion: Likely combination of emphysema/restrictive lung disease and  hypoventilation. Plan -She would benefit from nocturnal ventilation but does not want this - oxygen to keep SpO2 90 to 92% - scheduled BDs  WHO group 2 and 3 pulmonary HTN with ?WHO group 1 pulmonary HTN. Discussion: She has been  followed by CHF team as outpt, and on opsumit and selexipag. Plan - resume opsumit and selexipag   Best practice:  Diet: NPO DVT prophylaxis: SCDs GI prophylaxis: not indicated Mobility: bedrest Code Status: Full Disposition: ICU until hypoxia improves   Kara Mead MD. FCCP. [facility] Pager [phone] If no response call [phone]     11/26/2018, 8:31 AM

## 2018-11-26 NOTE — Plan of Care (Signed)
  Problem: Education: Goal: Knowledge of General Education information will improve Description Including pain rating scale, medication(s)/side effects and non-pharmacologic comfort measures Outcome: Progressing   Problem: Clinical Measurements: Goal: Ability to maintain clinical measurements within normal limits will improve Outcome: Progressing Goal: Will remain free from infection Outcome: Progressing Goal: Diagnostic test results will improve Outcome: Progressing Goal: Respiratory complications will improve Outcome: Progressing Goal: Cardiovascular complication will be avoided Outcome: Progressing   Problem: Activity: Goal: Risk for activity intolerance will decrease Outcome: Progressing   Problem: Nutrition: Goal: Adequate nutrition will be maintained Outcome: Progressing   Problem: Coping: Goal: Level of anxiety will decrease Outcome: Progressing   Problem: Elimination: Goal: Will not experience complications related to bowel motility Outcome: Progressing   Problem: Pain Managment: Goal: General experience of comfort will improve Outcome: Progressing   Problem: Safety: Goal: Ability to remain free from injury will improve Outcome: Progressing   Problem: Skin Integrity: Goal: Risk for impaired skin integrity will decrease Outcome: Progressing

## 2018-11-26 NOTE — Progress Notes (Signed)
Subjective: 3 Days Post-Op Procedure(s) (LRB): OPEN REDUCTION INTERNAL FIXATION (ORIF) RIGHT ANKLE FRACTURE (Right) Application Of Wound Vac (Right) Patient reports pain as mild.   More anxious about her breathing and wonders why she had problems post op.   Objective: Vital signs in last 24 hours: Temp:  [97.8 F (36.6 C)-98.7 F (37.1 C)] 98.7 F (37.1 C) (05/15 0400) Pulse Rate:  [77-92] 92 (05/15 0700) Resp:  [15-31] 18 (05/15 0700) BP: (77-146)/(41-80) 137/58 (05/15 0700) SpO2:  [71 %-99 %] 93 % (05/15 0739)  Intake/Output from previous day: 05/14 0701 - 05/15 0700 In: 490 [P.O.:490] Out: 2200 [Urine:2200] Intake/Output this shift: No intake/output data recorded.  Recent Labs    11/24/18 0228 11/24/18 0552 11/24/18 0804 11/25/18 0451 11/26/18 0323  HGB 8.5* 6.5* 7.9* 8.4* 8.2*   Recent Labs    11/25/18 0451 11/26/18 0323  WBC 7.8 8.6  RBC 3.51* 3.48*  HCT 27.2* 27.4*  PLT 161 177   Recent Labs    11/25/18 0451 11/26/18 0323  NA 141 140  K 4.0 3.3*  CL 102 100  CO2 28 31  BUN 27* 23  CREATININE 1.60* 1.36*  GLUCOSE 159* 149*  CALCIUM 9.4 9.0   No results for input(s): LABPT, INR in the last 72 hours.  Right ankle with VAC dressing in place and functioning well. No drainage in the canister.   Assessment/Plan: 3 Days Post-Op Procedure(s) (LRB): OPEN REDUCTION INTERNAL FIXATION (ORIF) RIGHT ANKLE FRACTURE (Right) Application Of Wound Vac (Right) Plan Prevena VAC x 1 week at DC to CIR or other.  Non weight bearing right leg x 3 weeks. Follow up in the office in 1 week.  Pulmonary management per hospitalist.   Erlinda Hong, PA-C 11/26/2018, 8:04 AM  [employer] [phone]

## 2018-11-26 NOTE — Progress Notes (Addendum)
PROGRESS NOTE    TILLIE VIVERETTE  WUJ:[id number] DOB: [birth year] DOA: 11/22/2018 PCP: Maurice Small, MD    Brief Narrative: 73 year old female with history of COPD/chronic respiratory failure on 4 L O2, severe pulmonary hypertension, gait disorder, chronic diastolic CHF, diabetes, hypertension, stage III CKD, CAD presented to the ED following a mechanical fall she was diagnosed with a right ankle fracture which was reduced under conscious sedation and splinted, orthopedics was consulted, underwent ORIF of right ankle 5/12 Postop course complicated by worsening hypoxia and hypotension requiring dopamine and BiPAP: -Slowly improving, off dopamine and BiPAP, currently on 15 L high flow nasal cannula, no respiratory distress  Assessment & Plan:   1-right ankle fracture: -Ortho consulted, underwent ORIF 5/12-by Dr. Sharol Given -Postop course complicated by hypotension, hypoxia and anemia -Recommended to continue wound VAC for 1 week, nonweightbearing for 3 weeks and follow-up in 1 week -PT eval soon, stable from this standpoint  2.  Acute on chronic hypoxic respiratory failure -Patient has advanced COPD with chronic respiratory failure requiring 4 L O2 and severe pulmonary hypertension at baseline -Required BiPAP until 5/14 a.m., now off -Currently on 15 L high flow nasal cannula, will attempt to diurese today, use incentive spirometer, minimize sedating medicines and wean down O2 as tolerated -Restarted home pulmonary hypertension meds -Chest x-ray appears unremarkable  3.  Acute on chronic hypotension -Postop likely secondary to blood loss anemia, and pulmonary hypertension/mild volume overload, nitric oxide -PCCM following, weaned off dopamine -Patient has chronic hypotension and takes midodrine 5 mg 3 times daily at baseline, this was stopped by PCCM this a.m. will probably need this restarted in the next 1 to 2 days -Weaned off nitric oxide, restarted pulmonary hypertension meds -BP stable  now  4.  Severe chronic pulmonary hypertension -needs CODE STATUS discussion -Restarted selexipag and macitentan,   5.  Acute on chronic diastolic CHF -IV Lasix x2 doses today -Goal to keep dry while we wean down O2  6-COPD with chronic hypoxic respiratory failure: On 4 L home O2  -As above  7-type 2 diabetes with hyperglycemia: -Long-acting insulin on hold, CBGs in the 1 30-1 40 range,  -Continue sliding scale insulin, possibly restart long-acting insulin in the next day or so at a lower dose  Stage III chronic kidney disease: Creatinine baseline; 1.2--- 1.3. Continue to monitor closely  DVT prophylaxis: Add subcutaneous heparin Code Status: Full code Family Communication: No family at bedside , left message for daughter Disposition Plan: May need rehab  Consultants:   Dr. Sharol Given   Procedures:  None   Antimicrobials:   Clindamycin preop   Subjective: -Is okay, breathing improving overall, unfortunately still on 15 L high flow nasal cannula Objective: Vitals:   11/26/18 0900 11/26/18 1000 11/26/18 1015 11/26/18 1030  BP: (!) 141/75 (!) 114/45    Pulse: 90 89 85 88  Resp: (!) 25 (!) 26 (!) 25 (!) 21  Temp:      TempSrc:      SpO2: (!) 88% 93% 93% (!) 89%  Weight:      Height:        Intake/Output Summary (Last 24 hours) at 11/26/2018 1102 Last data filed at 11/26/2018 0800 Gross per 24 hour  Intake 490 ml  Output 2200 ml  Net -1710 ml   Filed Weights   11/22/18 2140 11/23/18 1227  Weight: 82.6 kg 82.6 kg    Examination:  Gen: Elderly chronically ill female awake, Alert, Oriented X 3, no distress HEENT: PERRLA, Neck supple,  no JVD Lungs: Few scattered rhonchi and basilar crackles on the left CVS: RRR,No Gallops,Rubs or new Murmurs Abd: soft, Non tender, non distended, BS present Extremities: No edema, right ankle with wound VAC Skin: no new rashes  Data Reviewed: I have personally reviewed following labs and imaging studies  CBC: Recent Labs    Lab Dec 06, 2018 1615 11/23/18 0715 11/23/18 1913  11/24/18 0228 11/24/18 0552 11/24/18 0804 11/25/18 0451 11/26/18 0323  WBC 7.3 7.1 7.4  --   --   --  7.6 7.8 8.6  NEUTROABS 5.3  --   --   --   --   --   --   --   --   HGB 8.2* 8.2* 6.9*   < > 8.5* 6.5* 7.9* 8.4* 8.2*  HCT 28.0* 27.5* 24.4*   < > 25.0* 19.0* 26.4* 27.2* 27.4*  MCV 79.3* 76.8* 80.0  --   --   --  79.3* 77.5* 78.7*  PLT 205 191 192  --   --   --  164 161 177   < > = values in this interval not displayed.   Basic Metabolic Panel: Recent Labs  Lab 11/23/18 0715 11/23/18 1913  11/24/18 0228 11/24/18 0552 11/24/18 1333 11/25/18 0451 11/26/18 0323  NA 139 139   < > 139 119* 138 141 140  K 4.0 3.9   < > 4.3 4.2 4.1 4.0 3.3*  CL 98 102  --   --   --  100 102 100  CO2 30 26  --   --   --  _0 GLUCOSE 111* 204*  --   --   --  137* 159* 149*  BUN 25* 25*  --   --   --  26* 27* 23  CREATININE 1.51* 1.45*  --   --   --  1.57* 1.60* 1.36*  CALCIUM 9.1 8.8*  --   --   --  8.8* 9.4 9.0  MG  --   --   --   --   --   --   --  2.0  PHOS  --   --   --   --   --   --   --  2.8   < > = values in this interval not displayed.   GFR: Estimated Creatinine Clearance: 37.2 mL/min (A) (by C-G formula based on SCr of 1.36 mg/dL (H)). Liver Function Tests: Recent Labs  Lab 11/25/18 0451  AST 20  ALT 11  ALKPHOS 125  BILITOT 0.8  PROT 6.1*  ALBUMIN 3.1*   No results for input(s): LIPASE, AMYLASE in the last 168 hours. No results for input(s): AMMONIA in the last 168 hours. Coagulation Profile: No results for input(s): INR, PROTIME in the last 168 hours. Cardiac Enzymes: No results for input(s): CKTOTAL, CKMB, CKMBINDEX, TROPONINI in the last 168 hours. BNP (last 3 results) No results for input(s): PROBNP in the last 8760 hours. HbA1C: No results for input(s): HGBA1C in the last 72 hours. CBG: Recent Labs  Lab 11/25/18 0712 11/25/18 1110 11/25/18 1630 11/25/18 1918 11/26/18 0725  GLUCAP 138* 148* 233* 182*  136*   Lipid Profile: No results for input(s): CHOL, HDL, LDLCALC, TRIG, CHOLHDL, LDLDIRECT in the last 72 hours. Thyroid Function Tests: No results for input(s): TSH, T4TOTAL, FREET4, T3FREE, THYROIDAB in the last 72 hours. Anemia Panel: No results for input(s): VITAMINB12, FOLATE, FERRITIN, TIBC, IRON, RETICCTPCT in the last 72 hours. Sepsis Labs: No results for  input(s): PROCALCITON, LATICACIDVEN in the last 168 hours.  Recent Results (from the past 240 hour(s))  SARS Coronavirus 2 (CEPHEID - Performed in [place] hospital lab), Hosp Order     Status: None   Collection Time: 11/22/18  5:34 PM  Result Value Ref Range Status   SARS Coronavirus 2 NEGATIVE NEGATIVE Final    Comment: (NOTE) If result is NEGATIVE SARS-CoV-2 target nucleic acids are NOT DETECTED. The SARS-CoV-2 RNA is generally detectable in upper and lower  respiratory specimens during the acute phase of infection. The lowest  concentration of SARS-CoV-2 viral copies this assay can detect is 250  copies / mL. A negative result does not preclude SARS-CoV-2 infection  and should not be used as the sole basis for treatment or other  patient management decisions.  A negative result may occur with  improper specimen collection / handling, submission of specimen other  than nasopharyngeal swab, presence of viral mutation(s) within the  areas targeted by this assay, and inadequate number of viral copies  (<250 copies / mL). A negative result must be combined with clinical  observations, patient history, and epidemiological information. If result is POSITIVE SARS-CoV-2 target nucleic acids are DETECTED. The SARS-CoV-2 RNA is generally detectable in upper and lower  respiratory specimens dur ing the acute phase of infection.  Positive  results are indicative of active infection with SARS-CoV-2.  Clinical  correlation with patient history and other diagnostic information is  necessary to determine patient infection status.   Positive results do  not rule out bacterial infection or co-infection with other viruses. If result is PRESUMPTIVE POSTIVE SARS-CoV-2 nucleic acids MAY BE PRESENT.   A presumptive positive result was obtained on the submitted specimen  and confirmed on repeat testing.  While 2019 novel coronavirus  (SARS-CoV-2) nucleic acids may be present in the submitted sample  additional confirmatory testing may be necessary for epidemiological  and / or clinical management purposes  to differentiate between  SARS-CoV-2 and other Sarbecovirus currently known to infect humans.  If clinically indicated additional testing with an alternate test  methodology [phone]) is advised. The SARS-CoV-2 RNA is generally  detectable in upper and lower respiratory sp ecimens during the acute  phase of infection. The expected result is Negative. Fact Sheet for Patients:  StrictlyIdeas.no Fact Sheet for Healthcare Providers: BankingDealers.co.za This test is not yet approved or cleared by the [place] FDA and has been authorized for detection and/or diagnosis of SARS-CoV-2 by FDA under an Emergency Use Authorization (EUA).  This EUA will remain in effect (meaning this test can be used) for the duration of the COVID-19 declaration under Section 564(b)(1) of the Act, 21 U.S.C. section 360bbb-3(b)(1), unless the authorization is terminated or revoked sooner. Performed at [facility], [place] [address], [place], [place] [address]   MRSA PCR Screening     Status: None   Collection Time: 11/22/18 11:35 PM  Result Value Ref Range Status   MRSA by PCR NEGATIVE NEGATIVE Final    Comment:        The GeneXpert MRSA Assay (FDA approved for NASAL specimens only), is one component of a comprehensive MRSA colonization surveillance program. It is not intended to diagnose MRSA infection nor to guide or monitor treatment for MRSA infections. Performed  at [facility], [place] [address] [place], [place] [address]          Radiology Studies: Dg Chest [place] 1 View  Result Date: 11/25/2018 CLINICAL DATA:  73 year old  female with history of lung cancer. Fall. Shortness of breath. EXAM: PORTABLE CHEST 1 VIEW COMPARISON:  11/24/2018 and earlier. FINDINGS: Portable AP semi upright view at 0516 hours. Stable lung volumes. Stable cardiomegaly and mediastinal contours. Calcified aortic atherosclerosis. Visualized tracheal air column is within normal limits. Chronic postoperative changes in the right lower lung. Chronic increased pulmonary interstitial markings, mildly decreased coarse interstitial opacity since yesterday. Ventilation now appears to be at baseline including in the left lower lobe where the hemidiaphragm is again visible. No pneumothorax or pleural effusion. No acute osseous abnormality identified. IMPRESSION: 1. Improved ventilation since yesterday, lungs now appear at baseline. 2. Cardiomegaly.  Aortic Atherosclerosis (ICD10-I70.0). Electronically Signed   By: Genevie Ann M.D.   On: 11/25/2018 08:14        Scheduled Meds:  atorvastatin  40 mg Oral Daily   chlorhexidine  15 mL Mouth Rinse BID   docusate sodium  100 mg Oral BID   furosemide  40 mg Intravenous Once   gabapentin  300 mg Oral QHS   gabapentin  300 mg Oral TID   heparin injection (subcutaneous)  5,000 Units Subcutaneous Q8H   insulin aspart  0-9 Units Subcutaneous TID WC   macitentan  10 mg Oral Daily   mouth rinse  15 mL Mouth Rinse q12n4p   mometasone-formoterol  2 puff Inhalation BID   potassium chloride  40 mEq Oral Once   Selexipag  400 mcg Oral BID   venlafaxine XR  75 mg Oral Q breakfast   Continuous Infusions:  sodium chloride Stopped (11/24/18 2122)   methocarbamol (ROBAXIN) IV       LOS: 4 days    Time spent: 35 minutes.     Domenic Polite, MD [facility]  11/26/2018, 11:02 AM

## 2018-11-26 NOTE — Progress Notes (Signed)
Physical Therapy Treatment Patient Details Name: Amy Shepard MRN: [id number] DOB: 11-10-1945 Today's Date: 11/26/2018    History of Present Illness Pt is a 73 y.o. F with significant PMH of COPD/chronic respiratory failure, severe pulmonary hypertension, chronic diastolic CHF, diabetes, stage III CKD, CAD who presents following a fall and a right trimalleolar ankle fracture. Now s/p ORIF 5/13     PT Comments    Patient is progressing very well towards their physical therapy goals. Requiring min assist for transfer from bed to chair with walker. SpO2 90% on 13L O2 via HFNC. Suspect pt will continue to progress well with mobility/ambulation as she continues to wean. Needs continued transfer, gait training and strengthening. CIR remains appropriate.     Follow Up Recommendations  CIR;Supervision for mobility/OOB     Equipment Recommendations  Wheelchair (measurements PT)    Recommendations for Other Services OT consult     Precautions / Restrictions Precautions Precautions: Fall Required Braces or Orthoses: Other Brace Other Brace: R CAM boot Restrictions Weight Bearing Restrictions: Yes RLE Weight Bearing: Non weight bearing    Mobility  Bed Mobility Overal bed mobility: Needs Assistance Bed Mobility: Supine to Sit     Supine to sit: Supervision     General bed mobility comments: Supervision for RLE negotiation off of bed  Transfers Overall transfer level: Needs assistance Equipment used: Rolling walker (2 wheeled) Transfers: Sit to/from [facility] Sit to Stand: Min assist Stand pivot transfers: Min assist       General transfer comment: Min assist to lift and then stand pivot from bed to chair. Cues for sequencing  Ambulation/Gait                 Stairs             Wheelchair Mobility    Modified Rankin (Stroke Patients Only)       Balance Overall balance assessment: Needs assistance Sitting-balance support:  Feet supported Sitting balance-Leahy Scale: Good     Standing balance support: Bilateral upper extremity supported Standing balance-Leahy Scale: Poor Standing balance comment: reliant on external support                            Cognition Arousal/Alertness: Awake/alert Behavior During Therapy: WFL for tasks assessed/performed Overall Cognitive Status: Within Functional Limits for tasks assessed                                        Exercises General Exercises - Lower Extremity Ankle Circles/Pumps: 5 reps;Right;Supine Quad Sets: 5 reps;Right;Supine    General Comments        Pertinent Vitals/Pain      Home Living                      Prior Function            PT Goals (current goals can now be found in the care plan section) Acute Rehab PT Goals Patient Stated Goal: "stay here to get rehab." PT Goal Formulation: With patient Time For Goal Achievement: 12/09/18 Potential to Achieve Goals: Good Progress towards PT goals: Progressing toward goals    Frequency    Min 5X/week      PT Plan Current plan remains appropriate    Co-evaluation  AM-PAC PT "6 Clicks" Mobility   Outcome Measure  Help needed turning from your back to your side while in a flat bed without using bedrails?: None Help needed moving from lying on your back to sitting on the side of a flat bed without using bedrails?: A Little Help needed moving to and from a bed to a chair (including a wheelchair)?: A Little Help needed standing up from a chair using your arms (e.g., wheelchair or bedside chair)?: A Little Help needed to walk in hospital room?: A Lot Help needed climbing 3-5 steps with a railing? : A Lot 6 Click Score: 17    End of Session Equipment Utilized During Treatment: Other (comment);Oxygen(R CAM boot) Activity Tolerance: Patient tolerated treatment well Patient left: with call bell/phone within reach;in chair;with chair  alarm set Nurse Communication: Mobility status PT Visit Diagnosis: Unsteadiness on feet (R26.81);Other abnormalities of gait and mobility (R26.89);History of falling (Z91.81)     Time: 0814-4818 PT Time Calculation (min) (ACUTE ONLY): 20 min  Charges:  $Gait Training: 8-22 mins                     Ellamae Sia, [place], DPT Acute Rehabilitation Services Pager [phone] Office [phone]    Willy Eddy 11/26/2018, 4:31 PM

## 2018-11-26 NOTE — Progress Notes (Signed)
Patient refused BiPAP for the night.

## 2018-11-27 LAB — CBC
HCT: 28 % — ABNORMAL LOW (ref 36.0–46.0)
Hemoglobin: 8.5 g/dL — ABNORMAL LOW (ref 12.0–15.0)
MCH: 23.9 pg — ABNORMAL LOW (ref 26.0–34.0)
MCHC: 30.4 g/dL (ref 30.0–36.0)
MCV: 78.7 fL — ABNORMAL LOW (ref 80.0–100.0)
Platelets: 198 10*3/uL (ref 150–400)
RBC: 3.56 MIL/uL — ABNORMAL LOW (ref 3.87–5.11)
RDW: 18 % — ABNORMAL HIGH (ref 11.5–15.5)
WBC: 7.2 10*3/uL (ref 4.0–10.5)
nRBC: 0 % (ref 0.0–0.2)

## 2018-11-27 LAB — BPAM RBC
Blood Product Expiration Date: 202005202359
Blood Product Expiration Date: 202006042359
Blood Product Expiration Date: 202006052359
ISSUE DATE / TIME: 202005122027
Unit Type and Rh: 1700
Unit Type and Rh: 1700
Unit Type and Rh: 1700

## 2018-11-27 LAB — GLUCOSE, CAPILLARY
Glucose-Capillary: 167 mg/dL — ABNORMAL HIGH (ref 70–99)
Glucose-Capillary: 249 mg/dL — ABNORMAL HIGH (ref 70–99)
Glucose-Capillary: 170 mg/dL — ABNORMAL HIGH (ref 70–99)
Glucose-Capillary: 197 mg/dL — ABNORMAL HIGH (ref 70–99)

## 2018-11-27 LAB — TYPE AND SCREEN
ABO/RH(D): B NEG
Antibody Screen: NEGATIVE
Unit division: 0
Unit division: 0
Unit division: 0

## 2018-11-27 LAB — BASIC METABOLIC PANEL
Anion gap: 11 (ref 5–15)
BUN: 24 mg/dL — ABNORMAL HIGH (ref 8–23)
CO2: 28 mmol/L (ref 22–32)
Calcium: 8.9 mg/dL (ref 8.9–10.3)
Chloride: 100 mmol/L (ref 98–111)
Creatinine, Ser: 1.27 mg/dL — ABNORMAL HIGH (ref 0.44–1.00)
GFR calc Af Amer: 49 mL/min — ABNORMAL LOW (ref >60)
GFR calc non Af Amer: 42 mL/min — ABNORMAL LOW (ref >60)
Glucose, Bld: 227 mg/dL — ABNORMAL HIGH (ref 70–99)
Potassium: 3.8 mmol/L (ref 3.5–5.1)
Sodium: 139 mmol/L (ref 135–145)

## 2018-11-27 MED ORDER — MIDODRINE HCL 5 MG PO TABS
5.0000 mg | ORAL_TABLET | Freq: Three times a day (TID) | ORAL | Status: DC
  Administered 2018-11-27 – 2018-12-01 (×13): 5 mg via ORAL
  Filled 2018-11-27 (×13): qty 1

## 2018-11-27 MED ORDER — FUROSEMIDE 10 MG/ML IJ SOLN
40.0000 mg | Freq: Once | INTRAMUSCULAR | Status: AC
  Administered 2018-11-27: 40 mg via INTRAVENOUS
  Filled 2018-11-27: qty 4

## 2018-11-27 MED ORDER — INSULIN DETEMIR 100 UNIT/ML ~~LOC~~ SOLN
18.0000 [IU] | Freq: Every day | SUBCUTANEOUS | Status: DC
  Administered 2018-11-27 – 2018-11-30 (×4): 18 [IU] via SUBCUTANEOUS
  Filled 2018-11-27 (×6): qty 0.18

## 2018-11-27 NOTE — Progress Notes (Signed)
RT note- Patient on 7l/min during bath sp02 80%, increased to 10L with wean as tolerated.

## 2018-11-27 NOTE — Progress Notes (Addendum)
   NAME:  Amy Shepard, MRN:  [id number], DOB:  July 24, 1945, LOS: 5 ADMISSION DATE:  11/22/2018, CONSULTATION DATE:  11/23/2018 REFERRING MD:  DUDA MD, CHIEF COMPLAINT:  Hypotension   Brief History   73 yo female presented with ankle pain and found to have distal fibular and medial malleolar fractures.  She had ORIF on 5/12.  She was hypotensive and somnolent post op, and was started on dopamine infusion.  She is followed by cardiology for pulmonary hypertension on opsumit and selexipeg.  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, DM, Depression, Diastolic CHF, HTN, CKD 4, non obstructive CAD, orthostatic hypotension  Significant Hospital Events   5/11 admit 5/12 ORIF, to ICU with hypotension, transfuse PRBC 5/13 NO weaned off 5/16 down to 7 liters   Consults:  Ortho  Procedures:    Significant Diagnostic Tests:  PFT 09/11/16 >> FEV1 1.45 (72%), FEV1% 87, TLC 2.95 (64%), DLCO 31% RHC 08/06/17 >> normal filling pressures, PVR 4.7, preserved CO PSG 08/27/17 >> AHI 3.8, SpO2 low 79% Echo 04/10/18 >> mild LVH, EF 55 to 60%, grade 1 DD, PAS 81 mmHg CT angio chest 08/18/18 >> atherosclerosis, small Rt effusion, b/l mosaic attentuation  Micro Data:  COVID 5/11 >> negative  Antimicrobials:  Clindamycin 5/12 >>   Interim history/subjective:   Objective   Blood Pressure (Abnormal) 153/114 (BP Location: Right Arm)   Pulse 81   Temperature 98.9 F (37.2 C) (Oral)   Respiration 20   Height _0  (1.575 m)   Weight 82.6 kg   Oxygen Saturation 92%   Body Mass Index 33.31 kg/m         Intake/Output Summary (Last 24 hours) at 11/27/2018 0926 Last data filed at 11/27/2018 0800 Gross per 24 hour  Intake 1200 ml  Output 2025 ml  Net -825 ml   Filed Weights   11/22/18 2140 11/23/18 1227  Weight: 82.6 kg 82.6 kg    Examination:  General: 73 year old white  female currently sitting up in a chair she is in no acute distress HEENT normocephalic atraumatic no jugular venous distention Pulmonary: Expiratory wheeze, some occasional rhonchi.  No accessory use. Now on 7 L nasal cannula. Cardiac: Regular rate and rhythm without murmur rub or gallop Abdomen: Soft nontender Extremities: Warm and dry, immobilization cast in place on the right lower extremity.  CMS intact. Neuro: Awake oriented no focal deficits.   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 DVT prophylaxis: SCDs GI prophylaxis: Not indicated Mobility: bedrest Code Status: Full code Will be available to see as needed over the weekend We will follow-up on Monday  Sherrilyn Rist, MD  [phone]

## 2018-11-27 NOTE — Progress Notes (Signed)
PROGRESS NOTE    Amy Shepard  WOE:[id number] DOB: September 04, 1945 DOA: 11/22/2018 PCP: Maurice Small, MD    Brief Narrative: 73 year old female with history of COPD/chronic respiratory failure on 4 L O2, severe pulmonary hypertension, gait disorder, chronic diastolic CHF, diabetes, hypertension, stage III CKD, CAD presented to the ED following a mechanical fall she was diagnosed with a right ankle fracture which was reduced under conscious sedation and splinted, orthopedics was consulted, underwent ORIF of right ankle 5/12 Postop course complicated by worsening hypoxia and hypotension requiring dopamine and BiPAP: -Slowly improving, off dopamine and BiPAP, no respiratory distress but difficulty weaning down O2  Assessment & Plan:   1-right ankle fracture: -Ortho consulted, underwent ORIF 5/12-by Dr. Sharol Given -Postop course complicated by hypotension, hypoxia and anemia -Recommended to continue wound VAC for 1 week, nonweightbearing for 3 weeks and follow-up in 1 week -PT eval soon, stable from this standpoint  2.  Acute on chronic hypoxic respiratory failure -Patient has advanced COPD with chronic respiratory failure requiring 4 L O2 and severe pulmonary hypertension at baseline, acute on chronic diastolic CHF -Required BiPAP until 5/14 a.m., now off -Diuresed with IV Lasix postop -Continue IV Lasix again today, have titrated her down to 8 L high flow nasal cannula  -Incentive spirometer increasing activity emphasized  -Continue to  wean down O2 as tolerated for sats of 88% -Restarted home pulmonary hypertension meds -Chest x-ray appears unremarkable  3.  Acute on chronic hypotension -Postop likely secondary to blood loss anemia, and pulmonary hypertension/mild volume overload, nitric oxide -weaned off dopamine -Patient has chronic hypotension and takes midodrine 5 mg 3 times daily at baseline -Restart midodrine  4.  Severe chronic pulmonary hypertension -needs CODE STATUS  discussion -Restarted selexipag and macitentan,   5.  Acute on chronic diastolic CHF -IV Lasix x1 again today, she is 3.6 L negative -Goal to keep dry while we wean down O2  6-COPD with chronic hypoxic respiratory failure: On 4 L home O2  -As above  7-type 2 diabetes with hyperglycemia: -CBGs are stable, long-acting insulin was on hold will restart today  Stage III chronic kidney disease: Creatinine baseline; 1.2--- 1.3. Continue to monitor closely  DVT prophylaxis: Add subcutaneous heparin Code Status: Full code Family Communication: No family at bedside , left message for daughter Disposition Plan: will need Rehab, CIR eval pending  Consultants:   Dr. Sharol Given  PCCM   Procedures:  None   Antimicrobials:   Clindamycin preop   Subjective: -Feels better, breathing improving, asymptomatic with O2 sats in the low 80s Objective: Vitals:   11/27/18 1000 11/27/18 1126 11/27/18 1135 11/27/18 1200  BP: 97/75  109/61 113/64  Pulse: 79  76 75  Resp: _0 Temp:    98.6 F (37 C)  TempSrc:    Oral  SpO2: 97% (!) 80% 99% 100%  Weight:      Height:        Intake/Output Summary (Last 24 hours) at 11/27/2018 1318 Last data filed at 11/27/2018 1100 Gross per 24 hour  Intake 1320 ml  Output 1700 ml  Net -380 ml   Filed Weights   11/22/18 2140 11/23/18 1227  Weight: 82.6 kg 82.6 kg    Examination:  Gen: Chronically ill female, AAO x3, no distress HEENT: PERRLA, Neck supple, no JVD Lungs: Scattered rhonchi at the bases rest clear CVS: RRR,No Gallops,Rubs or new Murmurs Abd: soft, Non tender, non distended, BS present Extremities: No edema, right ankle with wound VAC  skin: no new rashes   Data Reviewed: I have personally reviewed following labs and imaging studies  CBC: Recent Labs  Lab 11/22/18 1615  11/23/18 1913  11/24/18 0552 11/24/18 0804 11/25/18 0451 11/26/18 0323 11/27/18 0451  WBC 7.3   < > 7.4  --   --  7.6 7.8 8.6 7.2  NEUTROABS 5.3   --   --   --   --   --   --   --   --   HGB 8.2*   < > 6.9*   < > 6.5* 7.9* 8.4* 8.2* 8.5*  HCT 28.0*   < > 24.4*   < > 19.0* 26.4* 27.2* 27.4* 28.0*  MCV 79.3*   < > 80.0  --   --  79.3* 77.5* 78.7* 78.7*  PLT 205   < > 192  --   --  164 161 177 198   < > = values in this interval not displayed.   Basic Metabolic Panel: Recent Labs  Lab 11/23/18 1913  11/24/18 0552 11/24/18 1333 11/25/18 0451 11/26/18 0323 11/27/18 0451  NA 139   < > 119* 138 141 140 139  K 3.9   < > 4.2 4.1 4.0 3.3* 3.8  CL 102  --   --  100 102 100 100  CO2 26  --   --  _0 GLUCOSE 204*  --   --  137* 159* 149* 227*  BUN 25*  --   --  26* 27* 23 24*  CREATININE 1.45*  --   --  1.57* 1.60* 1.36* 1.27*  CALCIUM 8.8*  --   --  8.8* 9.4 9.0 8.9  MG  --   --   --   --   --  2.0  --   PHOS  --   --   --   --   --  2.8  --    < > = values in this interval not displayed.   GFR: Estimated Creatinine Clearance: 39.9 mL/min (A) (by C-G formula based on SCr of 1.27 mg/dL (H)). Liver Function Tests: Recent Labs  Lab 11/25/18 0451  AST 20  ALT 11  ALKPHOS 125  BILITOT 0.8  PROT 6.1*  ALBUMIN 3.1*   No results for input(s): LIPASE, AMYLASE in the last 168 hours. No results for input(s): AMMONIA in the last 168 hours. Coagulation Profile: No results for input(s): INR, PROTIME in the last 168 hours. Cardiac Enzymes: No results for input(s): CKTOTAL, CKMB, CKMBINDEX, TROPONINI in the last 168 hours. BNP (last 3 results) No results for input(s): PROBNP in the last 8760 hours. HbA1C: No results for input(s): HGBA1C in the last 72 hours. CBG: Recent Labs  Lab 11/26/18 1134 11/26/18 1629 11/26/18 2314 11/27/18 0720 11/27/18 1121  GLUCAP 153* 162* 203* 167* 249*   Lipid Profile: No results for input(s): CHOL, HDL, LDLCALC, TRIG, CHOLHDL, LDLDIRECT in the last 72 hours. Thyroid Function Tests: No results for input(s): TSH, T4TOTAL, FREET4, T3FREE, THYROIDAB in the last 72 hours. Anemia Panel: No  results for input(s): VITAMINB12, FOLATE, FERRITIN, TIBC, IRON, RETICCTPCT in the last 72 hours. Sepsis Labs: No results for input(s): PROCALCITON, LATICACIDVEN in the last 168 hours.  Recent Results (from the past 240 hour(s))  SARS Coronavirus 2 (CEPHEID - Performed in [place] hospital lab), Hosp Order     Status: None   Collection Time: 11/22/18  5:34 PM  Result Value Ref Range Status   SARS Coronavirus  2 NEGATIVE NEGATIVE Final    Comment: (NOTE) If result is NEGATIVE SARS-CoV-2 target nucleic acids are NOT DETECTED. The SARS-CoV-2 RNA is generally detectable in upper and lower  respiratory specimens during the acute phase of infection. The lowest  concentration of SARS-CoV-2 viral copies this assay can detect is 250  copies / mL. A negative result does not preclude SARS-CoV-2 infection  and should not be used as the sole basis for treatment or other  patient management decisions.  A negative result may occur with  improper specimen collection / handling, submission of specimen other  than nasopharyngeal swab, presence of viral mutation(s) within the  areas targeted by this assay, and inadequate number of viral copies  (<250 copies / mL). A negative result must be combined with clinical  observations, patient history, and epidemiological information. If result is POSITIVE SARS-CoV-2 target nucleic acids are DETECTED. The SARS-CoV-2 RNA is generally detectable in upper and lower  respiratory specimens dur ing the acute phase of infection.  Positive  results are indicative of active infection with SARS-CoV-2.  Clinical  correlation with patient history and other diagnostic information is  necessary to determine patient infection status.  Positive results do  not rule out bacterial infection or co-infection with other viruses. If result is PRESUMPTIVE POSTIVE SARS-CoV-2 nucleic acids MAY BE PRESENT.   A presumptive positive result was obtained on the submitted specimen  and  confirmed on repeat testing.  While 2019 novel coronavirus  (SARS-CoV-2) nucleic acids may be present in the submitted sample  additional confirmatory testing may be necessary for epidemiological  and / or clinical management purposes  to differentiate between  SARS-CoV-2 and other Sarbecovirus currently known to infect humans.  If clinically indicated additional testing with an alternate test  methodology [phone]) is advised. The SARS-CoV-2 RNA is generally  detectable in upper and lower respiratory sp ecimens during the acute  phase of infection. The expected result is Negative. Fact Sheet for Patients:  StrictlyIdeas.no Fact Sheet for Healthcare Providers: BankingDealers.co.za This test is not yet approved or cleared by the [place] FDA and has been authorized for detection and/or diagnosis of SARS-CoV-2 by FDA under an Emergency Use Authorization (EUA).  This EUA will remain in effect (meaning this test can be used) for the duration of the COVID-19 declaration under Section 564(b)(1) of the Act, 21 U.S.C. section 360bbb-3(b)(1), unless the authorization is terminated or revoked sooner. Performed at [facility], [place] [address] [place], [place] [address]   MRSA PCR Screening     Status: None   Collection Time: 11/22/18 11:35 PM  Result Value Ref Range Status   MRSA by PCR NEGATIVE NEGATIVE Final    Comment:        The GeneXpert MRSA Assay (FDA approved for NASAL specimens only), is one component of a comprehensive MRSA colonization surveillance program. It is not intended to diagnose MRSA infection nor to guide or monitor treatment for MRSA infections. Performed at [facility], [place] [address] [place], [place] [address]          Radiology Studies: No results found.      Scheduled Meds: . atorvastatin  40 mg Oral Daily  . chlorhexidine  15 mL Mouth Rinse BID  .  dextromethorphan-guaiFENesin  2 tablet Oral BID  . docusate sodium  100 mg Oral BID  . gabapentin  300 mg Oral QHS  . gabapentin  300 mg Oral TID  . heparin injection (subcutaneous)  5,000 Units  Subcutaneous Q8H  . insulin aspart  0-9 Units Subcutaneous TID WC  . insulin detemir  18 Units Subcutaneous QHS  . macitentan  10 mg Oral Daily  . mometasone-formoterol  2 puff Inhalation BID  . Selexipag  400 mcg Oral BID  . venlafaxine XR  75 mg Oral Q breakfast   Continuous Infusions: . sodium chloride Stopped (11/24/18 2122)  . methocarbamol (ROBAXIN) IV       LOS: 5 days    Time spent: 25 minutes.     Domenic Polite, MD [facility]  11/27/2018, 1:18 PM

## 2018-11-28 DIAGNOSIS — S82831A Other fracture of upper and lower end of right fibula, initial encounter for closed fracture: Secondary | ICD-10-CM

## 2018-11-28 LAB — BASIC METABOLIC PANEL
Anion gap: 9 (ref 5–15)
BUN: 23 mg/dL (ref 8–23)
CO2: 28 mmol/L (ref 22–32)
Calcium: 8.9 mg/dL (ref 8.9–10.3)
Chloride: 98 mmol/L (ref 98–111)
Creatinine, Ser: 1.26 mg/dL — ABNORMAL HIGH (ref 0.44–1.00)
GFR calc Af Amer: 49 mL/min — ABNORMAL LOW (ref >60)
GFR calc non Af Amer: 43 mL/min — ABNORMAL LOW (ref >60)
Glucose, Bld: 232 mg/dL — ABNORMAL HIGH (ref 70–99)
Potassium: 3.8 mmol/L (ref 3.5–5.1)
Sodium: 135 mmol/L (ref 135–145)

## 2018-11-28 LAB — GLUCOSE, CAPILLARY
Glucose-Capillary: 145 mg/dL — ABNORMAL HIGH (ref 70–99)
Glucose-Capillary: 302 mg/dL — ABNORMAL HIGH (ref 70–99)
Glucose-Capillary: 226 mg/dL — ABNORMAL HIGH (ref 70–99)
Glucose-Capillary: 249 mg/dL — ABNORMAL HIGH (ref 70–99)

## 2018-11-28 MED ORDER — FUROSEMIDE 20 MG PO TABS
20.0000 mg | ORAL_TABLET | Freq: Every day | ORAL | Status: DC
  Administered 2018-11-28 – 2018-11-29 (×2): 20 mg via ORAL
  Filled 2018-11-28 (×2): qty 1

## 2018-11-28 NOTE — Progress Notes (Addendum)
PROGRESS NOTE    EMMANI LESUEUR  YSH:[id number] DOB: Jan 08, 1946 DOA: 11/22/2018 PCP: Maurice Small, MD    Brief Narrative: 73 year old female with history of COPD/chronic respiratory failure on 4 L O2, severe pulmonary hypertension, gait disorder, chronic diastolic CHF, diabetes, hypertension, stage III CKD, CAD presented to the ED following a mechanical fall she was diagnosed with a right ankle fracture which was reduced under conscious sedation and splinted, orthopedics was consulted, underwent ORIF of right ankle 5/12 Postop course complicated by worsening hypoxia and hypotension requiring dopamine and BiPAP: -Slowly improving, off dopamine and BiPAP, no respiratory distress but difficulty weaning down O2  Assessment & Plan:   1-Right ankle fracture: -Ortho consulted, underwent ORIF 5/12-by Dr. Sharol Given -Postop course complicated by hypotension, hypoxia and anemia -Recommended to continue wound VAC for 1 week, nonweightbearing for 3 weeks and follow-up in 1 week -PT eval soon, stable from this standpoint  2.  Acute on chronic hypoxic respiratory failure -Patient has advanced COPD with chronic respiratory failure requiring 4 L O2 and severe pulmonary hypertension at baseline, acute on chronic diastolic CHF -Required BiPAP until 5/14 a.m., now off -Diuresed with IV Lasix postop -diuresed with IV lasix, change to PO today, wean O2, down to 6 L now to keep sats above 88% -Encouraged incentive spirometer and increased activity -Chest x-ray is unremarkable -Transfer to telemetry  3.  Acute on chronic hypotension -Postop likely secondary to blood loss anemia, and pulmonary hypertension/mild volume overload, nitric oxide -weaned off dopamine -Patient has chronic hypotension and takes midodrine 5 mg 3 times daily at baseline -Restarted midodrine  4.  Severe chronic pulmonary hypertension -needs CODE STATUS discussion -Restarted selexipag and macitentan,   5.  Acute on chronic  diastolic CHF -IV Lasix x1 again today, she is 3.6 L negative -Goal to keep dry while we wean down O2  6-COPD with chronic hypoxic respiratory failure: On 4 L home O2  -As above  7-type 2 diabetes with hyperglycemia: -CBGs are stable, long-acting insulin was on hold will restart today  Stage III chronic kidney disease: Creatinine baseline; 1.2--- 1.3. Continue to monitor closely  DVT prophylaxis: Add subcutaneous heparin Code Status: Full code Family Communication: No family at bedside , left message for daughter Raymondo Band Disposition Plan: will need Rehab, CIR evaluating  Consultants:   Dr. Sharol Given  PCCM   Procedures:  None   Antimicrobials:   Clindamycin preop   Subjective: -breathing better, no complaints  Objective: Vitals:   11/28/18 0700 11/28/18 0728 11/28/18 0800 11/28/18 1042  BP: 138/75  126/90 138/72  Pulse: 74  82 77  Resp: _0 Temp:   98.3 F (36.8 C) 98 F (36.7 C)  TempSrc:   Oral Oral  SpO2: 94% 93% (!) 87% 98%  Weight:    79.9 kg  Height:    5' 1" (1.549 m)    Intake/Output Summary (Last 24 hours) at 11/28/2018 1328 Last data filed at 11/28/2018 1221 Gross per 24 hour  Intake 840 ml  Output -  Net 840 ml   Filed Weights   11/22/18 2140 11/23/18 1227 11/28/18 1042  Weight: 82.6 kg 82.6 kg 79.9 kg    Examination:  Gen: Awake, Alert, Oriented X 3,  HEENT: PERRLA, Neck supple, no JVD Lungs: poor air movement CVS: RRR,No Gallops,Rubs or new Murmurs Abd: soft, Non tender, non distended, BS present Extremities: No edema, right ankle with wound VAC  skin: no new rashes   Data Reviewed: I have personally  reviewed following labs and imaging studies  CBC: Recent Labs  Lab 11/22/18 1615  11/23/18 1913  11/24/18 0552 11/24/18 0804 11/25/18 0451 11/26/18 0323 11/27/18 0451  WBC 7.3   < > 7.4  --   --  7.6 7.8 8.6 7.2  NEUTROABS 5.3  --   --   --   --   --   --   --   --   HGB 8.2*   < > 6.9*   < > 6.5* 7.9* 8.4* 8.2*  8.5*  HCT 28.0*   < > 24.4*   < > 19.0* 26.4* 27.2* 27.4* 28.0*  MCV 79.3*   < > 80.0  --   --  79.3* 77.5* 78.7* 78.7*  PLT 205   < > 192  --   --  164 161 177 198   < > = values in this interval not displayed.   Basic Metabolic Panel: Recent Labs  Lab 11/24/18 1333 11/25/18 0451 11/26/18 0323 11/27/18 0451 11/28/18 0422  NA 138 141 140 139 135  K 4.1 4.0 3.3* 3.8 3.8  CL 100 102 100 100 98  CO2 _0 GLUCOSE 137* 159* 149* 227* 232*  BUN 26* 27* 23 24* 23  CREATININE 1.57* 1.60* 1.36* 1.27* 1.26*  CALCIUM 8.8* 9.4 9.0 8.9 8.9  MG  --   --  2.0  --   --   PHOS  --   --  2.8  --   --    GFR: Estimated Creatinine Clearance: 38.6 mL/min (A) (by C-G formula based on SCr of 1.26 mg/dL (H)). Liver Function Tests: Recent Labs  Lab 11/25/18 0451  AST 20  ALT 11  ALKPHOS 125  BILITOT 0.8  PROT 6.1*  ALBUMIN 3.1*   No results for input(s): LIPASE, AMYLASE in the last 168 hours. No results for input(s): AMMONIA in the last 168 hours. Coagulation Profile: No results for input(s): INR, PROTIME in the last 168 hours. Cardiac Enzymes: No results for input(s): CKTOTAL, CKMB, CKMBINDEX, TROPONINI in the last 168 hours. BNP (last 3 results) No results for input(s): PROBNP in the last 8760 hours. HbA1C: No results for input(s): HGBA1C in the last 72 hours. CBG: Recent Labs  Lab 11/27/18 1121 11/27/18 1657 11/27/18 2130 11/28/18 0737 11/28/18 1142  GLUCAP 249* 170* 197* 145* 302*   Lipid Profile: No results for input(s): CHOL, HDL, LDLCALC, TRIG, CHOLHDL, LDLDIRECT in the last 72 hours. Thyroid Function Tests: No results for input(s): TSH, T4TOTAL, FREET4, T3FREE, THYROIDAB in the last 72 hours. Anemia Panel: No results for input(s): VITAMINB12, FOLATE, FERRITIN, TIBC, IRON, RETICCTPCT in the last 72 hours. Sepsis Labs: No results for input(s): PROCALCITON, LATICACIDVEN in the last 168 hours.  Recent Results (from the past 240 hour(s))  SARS Coronavirus 2  (CEPHEID - Performed in [place] hospital lab), Hosp Order     Status: None   Collection Time: 11/22/18  5:34 PM  Result Value Ref Range Status   SARS Coronavirus 2 NEGATIVE NEGATIVE Final    Comment: (NOTE) If result is NEGATIVE SARS-CoV-2 target nucleic acids are NOT DETECTED. The SARS-CoV-2 RNA is generally detectable in upper and lower  respiratory specimens during the acute phase of infection. The lowest  concentration of SARS-CoV-2 viral copies this assay can detect is 250  copies / mL. A negative result does not preclude SARS-CoV-2 infection  and should not be used as the sole basis for treatment or other  patient management decisions.  A negative result may occur with  improper specimen collection / handling, submission of specimen other  than nasopharyngeal swab, presence of viral mutation(s) within the  areas targeted by this assay, and inadequate number of viral copies  (<250 copies / mL). A negative result must be combined with clinical  observations, patient history, and epidemiological information. If result is POSITIVE SARS-CoV-2 target nucleic acids are DETECTED. The SARS-CoV-2 RNA is generally detectable in upper and lower  respiratory specimens dur ing the acute phase of infection.  Positive  results are indicative of active infection with SARS-CoV-2.  Clinical  correlation with patient history and other diagnostic information is  necessary to determine patient infection status.  Positive results do  not rule out bacterial infection or co-infection with other viruses. If result is PRESUMPTIVE POSTIVE SARS-CoV-2 nucleic acids MAY BE PRESENT.   A presumptive positive result was obtained on the submitted specimen  and confirmed on repeat testing.  While 2019 novel coronavirus  (SARS-CoV-2) nucleic acids may be present in the submitted sample  additional confirmatory testing may be necessary for epidemiological  and / or clinical management purposes  to differentiate  between  SARS-CoV-2 and other Sarbecovirus currently known to infect humans.  If clinically indicated additional testing with an alternate test  methodology [phone]) is advised. The SARS-CoV-2 RNA is generally  detectable in upper and lower respiratory sp ecimens during the acute  phase of infection. The expected result is Negative. Fact Sheet for Patients:  StrictlyIdeas.no Fact Sheet for Healthcare Providers: BankingDealers.co.za This test is not yet approved or cleared by the [place] FDA and has been authorized for detection and/or diagnosis of SARS-CoV-2 by FDA under an Emergency Use Authorization (EUA).  This EUA will remain in effect (meaning this test can be used) for the duration of the COVID-19 declaration under Section 564(b)(1) of the Act, 21 U.S.C. section 360bbb-3(b)(1), unless the authorization is terminated or revoked sooner. Performed at [facility], [place] [address] [place], [place] [address]   MRSA PCR Screening     Status: None   Collection Time: 11/22/18 11:35 PM  Result Value Ref Range Status   MRSA by PCR NEGATIVE NEGATIVE Final    Comment:        The GeneXpert MRSA Assay (FDA approved for NASAL specimens only), is one component of a comprehensive MRSA colonization surveillance program. It is not intended to diagnose MRSA infection nor to guide or monitor treatment for MRSA infections. Performed at [facility], [place] [address], [place], [place] [address]          Radiology Studies: No results found.      Scheduled Meds: . atorvastatin  40 mg Oral Daily  . chlorhexidine  15 mL Mouth Rinse BID  . dextromethorphan-guaiFENesin  2 tablet Oral BID  . docusate sodium  100 mg Oral BID  . gabapentin  300 mg Oral QHS  . gabapentin  300 mg Oral TID  . heparin injection (subcutaneous)  5,000 Units Subcutaneous Q8H  . insulin aspart  0-9 Units Subcutaneous TID WC  .  insulin detemir  18 Units Subcutaneous QHS  . macitentan  10 mg Oral Daily  . midodrine  5 mg Oral TID WC  . mometasone-formoterol  2 puff Inhalation BID  . Selexipag  400 mcg Oral BID  . venlafaxine XR  75 mg Oral Q breakfast   Continuous Infusions: . sodium chloride Stopped (11/24/18 2122)  . methocarbamol (ROBAXIN) IV  LOS: 6 days    Time spent: 25 minutes.     Domenic Polite, MD [facility]  11/28/2018, 1:28 PM

## 2018-11-28 NOTE — Evaluation (Signed)
Occupational Therapy Evaluation Patient Details Name: Amy Shepard MRN: [id number] DOB: 10/11/1945 Today's Date: 11/28/2018    History of Present Illness Pt is a 73 y.o. F with significant PMH of COPD/chronic respiratory failure, severe pulmonary hypertension, chronic diastolic CHF, diabetes, stage III CKD, CAD who presents following a fall and a right trimalleolar ankle fracture. Now s/p ORIF 5/13    Clinical Impression   Pt admitted with above. She demonstrates the below listed deficits and will benefit from continued OT to maximize safety and independence with BADLs.  Pt presents to OT with generalized weakness, decreased activity tolerance, decreased balance, and difficulty maintaining NWB.  She currently requires set up assist for UB ADLs and max A for LB ADLs, as well as min A for functional transfers.  She lives alone in a w/c accessible apartment.  Family available to provide intermittent assist.  Recommend CIR as she will likely be able to progress to mod I, with the intensity of therapy CIR offers.  This will reduce her risk of falls, injury, and readmission.  Will follow acutely.       Follow Up Recommendations  CIR    Equipment Recommendations  Tub/shower bench    Recommendations for Other Services Rehab consult     Precautions / Restrictions Precautions Precautions: Fall Required Braces or Orthoses: Other Brace Other Brace: R CAM boot Restrictions Weight Bearing Restrictions: Yes RLE Weight Bearing: Non weight bearing      Mobility Bed Mobility               General bed mobility comments: up in chair  Transfers Overall transfer level: Needs assistance Equipment used: Rolling walker (2 wheeled) Transfers: Sit to/from [facility] Sit to Stand: Min assist Stand pivot transfers: Min assist       General transfer comment: verbal cues for sequencing. Min A to stand and assist to maintain NWB     Balance Overall balance assessment:  Needs assistance Sitting-balance support: Feet supported Sitting balance-Leahy Scale: Good     Standing balance support: Bilateral upper extremity supported Standing balance-Leahy Scale: Poor Standing balance comment: reliant on UE support                            ADL either performed or assessed with clinical judgement   ADL Overall ADL's : Needs assistance/impaired Eating/Feeding: Independent   Grooming: Wash/dry hands;Wash/dry face;Oral care;Brushing hair;Set up;Sitting   Upper Body Bathing: Set up;Sitting   Lower Body Bathing: Moderate assistance;Sit to/from stand   Upper Body Dressing : Set up;Sitting   Lower Body Dressing: Maximal assistance;Sit to/from stand   Toilet Transfer: Minimal assistance;Stand-pivot;BSC;RW   Toileting- Clothing Manipulation and Hygiene: Maximal assistance;Sit to/from stand       Functional mobility during ADLs: Minimal assistance;Rolling walker General ADL Comments: Pt demonstrates difficutly maintaining NWB during functional tasks      Vision         Perception     Praxis      Pertinent Vitals/Pain Pain Assessment: No/denies pain     Hand Dominance Right   Extremity/Trunk Assessment Upper Extremity Assessment Upper Extremity Assessment: Overall WFL for tasks assessed   Lower Extremity Assessment Lower Extremity Assessment: Defer to PT evaluation RLE Deficits / Details: s/p ORIF. At least anti gravity strength       Communication Communication Communication: No difficulties   Cognition Arousal/Alertness: Awake/alert Behavior During Therapy: WFL for tasks assessed/performed Overall Cognitive Status: Within Functional Limits for  tasks assessed                                     General Comments  DOE 2/4 on 5L supplemental 02    Exercises Exercises: Other exercises Other Exercises Other Exercises: performed 10 reps chair push ups    Shoulder Instructions      Home Living  Family/patient expects to be discharged to:: Private residence Living Arrangements: Alone Available Help at Discharge: Family;Available PRN/intermittently Type of Home: Apartment Home Access: Level entry     Home Layout: One level     Bathroom Shower/Tub: Teacher, early years/pre: Handicapped height     Home Equipment: Environmental consultant - 4 wheels;Shower seat;Grab bars - tub/shower;Grab bars - toilet   Additional Comments: Pt reports she has a w/c accessible apt       Prior Functioning/Environment Level of Independence: Independent with assistive device(s)        Comments: Drives; uses Rollator, independent with ADL's        OT Problem List: Decreased strength;Decreased activity tolerance;Impaired balance (sitting and/or standing);Decreased knowledge of use of DME or AE;Decreased knowledge of precautions;Cardiopulmonary status limiting activity;Pain      OT Treatment/Interventions: Self-care/ADL training;Therapeutic exercise;DME and/or AE instruction;Therapeutic activities;Patient/family education;Balance training    OT Goals(Current goals can be found in the care plan section) Acute Rehab OT Goals Patient Stated Goal: To get stronger to regain independence and take care of self  OT Goal Formulation: With patient Time For Goal Achievement: 12/12/18 Potential to Achieve Goals: Good ADL Goals Pt Will Perform Grooming: with min assist;standing Pt Will Perform Lower Body Bathing: with min assist;sit to/from stand Pt Will Perform Lower Body Dressing: with min assist;sit to/from stand;with adaptive equipment Pt Will Transfer to Toilet: with min assist;stand pivot transfer;bedside commode Pt Will Perform Toileting - Clothing Manipulation and hygiene: with min assist;sit to/from stand Pt/caregiver will Perform Home Exercise Program: Increased strength;Right Upper extremity;Left upper extremity;With theraband;With written HEP provided;Independently  OT Frequency: Min 2X/week    Barriers to D/C: Decreased caregiver support          Co-evaluation              AM-PAC OT "6 Clicks" Daily Activity     Outcome Measure Help from another person eating meals?: None Help from another person taking care of personal grooming?: A Little Help from another person toileting, which includes using toliet, bedpan, or urinal?: A Lot Help from another person bathing (including washing, rinsing, drying)?: A Lot Help from another person to put on and taking off regular upper body clothing?: None Help from another person to put on and taking off regular lower body clothing?: A Lot 6 Click Score: 17   End of Session Equipment Utilized During Treatment: Rolling walker;Oxygen Nurse Communication: Mobility status  Activity Tolerance: Patient tolerated treatment well Patient left: in chair;with call bell/phone within reach  OT Visit Diagnosis: Pain Pain - Right/Left: Right Pain - part of body: Ankle and joints of foot                Time: 9794-8016 OT Time Calculation (min): 16 min Charges:  OT General Charges $OT Visit: 1 Visit OT Evaluation $OT Eval Moderate Complexity: 1 Mod  Lucille Passy, OTR/L Acute Rehabilitation Services Pager [phone] Office [phone]   Lucille Passy M 11/28/2018, 6:36 PM

## 2018-11-28 NOTE — Progress Notes (Signed)
Patient refused use of bipap for the evening. Will continue to monitor patient

## 2018-11-29 LAB — GLUCOSE, CAPILLARY
Glucose-Capillary: 179 mg/dL — ABNORMAL HIGH (ref 70–99)
Glucose-Capillary: 174 mg/dL — ABNORMAL HIGH (ref 70–99)
Glucose-Capillary: 161 mg/dL — ABNORMAL HIGH (ref 70–99)
Glucose-Capillary: 217 mg/dL — ABNORMAL HIGH (ref 70–99)

## 2018-11-29 LAB — CBC
HCT: 28.6 % — ABNORMAL LOW (ref 36.0–46.0)
Hemoglobin: 8.6 g/dL — ABNORMAL LOW (ref 12.0–15.0)
MCH: 23.9 pg — ABNORMAL LOW (ref 26.0–34.0)
MCHC: 30.1 g/dL (ref 30.0–36.0)
MCV: 79.4 fL — ABNORMAL LOW (ref 80.0–100.0)
Platelets: 245 10*3/uL (ref 150–400)
RBC: 3.6 MIL/uL — ABNORMAL LOW (ref 3.87–5.11)
RDW: 18.3 % — ABNORMAL HIGH (ref 11.5–15.5)
WBC: 7.5 10*3/uL (ref 4.0–10.5)
nRBC: 0 % (ref 0.0–0.2)

## 2018-11-29 LAB — BASIC METABOLIC PANEL
Anion gap: 8 (ref 5–15)
BUN: 24 mg/dL — ABNORMAL HIGH (ref 8–23)
CO2: 29 mmol/L (ref 22–32)
Calcium: 9 mg/dL (ref 8.9–10.3)
Chloride: 101 mmol/L (ref 98–111)
Creatinine, Ser: 1.38 mg/dL — ABNORMAL HIGH (ref 0.44–1.00)
GFR calc Af Amer: 44 mL/min — ABNORMAL LOW (ref >60)
GFR calc non Af Amer: 38 mL/min — ABNORMAL LOW (ref >60)
Glucose, Bld: 185 mg/dL — ABNORMAL HIGH (ref 70–99)
Potassium: 4.4 mmol/L (ref 3.5–5.1)
Sodium: 138 mmol/L (ref 135–145)

## 2018-11-29 MED ORDER — FUROSEMIDE 20 MG PO TABS
20.0000 mg | ORAL_TABLET | Freq: Two times a day (BID) | ORAL | Status: DC
  Administered 2018-11-29 – 2018-12-01 (×4): 20 mg via ORAL
  Filled 2018-11-29 (×4): qty 1

## 2018-11-29 NOTE — Progress Notes (Signed)
   NAME:  Amy Shepard, MRN:  [id number], DOB:  October 27, 1945, LOS: 7 ADMISSION DATE:  11/22/2018, CONSULTATION DATE:  11/23/2018 REFERRING MD:  DUDA MD, CHIEF COMPLAINT:  Hypotension   Brief History   73 yo female presented with ankle pain and found to have distal fibular and medial malleolar fractures.  She had ORIF on 5/12.  She was hypotensive and somnolent post op, and was started on dopamine infusion.  She is followed by cardiology for pulmonary hypertension on opsumit and selexipeg.  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, DM, Depression, Diastolic CHF, HTN, CKD 4, non obstructive CAD, orthostatic hypotension  Significant Hospital Events   5/11 admit 5/12 ORIF, to ICU with hypotension, transfuse PRBC 5/13 NO weaned off 5/16 down to 7 liters   Consults:  Ortho  Procedures:    Significant Diagnostic Tests:  PFT 09/11/16 >> FEV1 1.45 (72%), FEV1% 87, TLC 2.95 (64%), DLCO 31% RHC 08/06/17 >> normal filling pressures, PVR 4.7, preserved CO PSG 08/27/17 >> AHI 3.8, SpO2 low 79% Echo 04/10/18 >> mild LVH, EF 55 to 60%, grade 1 DD, PAS 81 mmHg CT angio chest 08/18/18 >> atherosclerosis, small Rt effusion, b/l mosaic attentuation  Micro Data:  COVID 5/11 >> negative  Antimicrobials:  Clindamycin 5/12 >> 5/13  Interim history/subjective:  Feeling much better, plan is for rehab  Objective   BP (!) 89/44   Pulse 82   Temp 98.2 F (36.8 C) (Oral)   Resp 15   Ht _0  (1.549 m)   Wt 79.4 kg   SpO2 92%   BMI 33.07 kg/m         Intake/Output Summary (Last 24 hours) at 11/29/2018 1054 Last data filed at 11/29/2018 0851 Gross per 24 hour  Intake 1380 ml  Output 900 ml  Net 480 ml   Filed Weights   11/23/18 1227 11/28/18 1042 11/29/18 0404  Weight: 82.6 kg 79.9 kg 79.4 kg    Examination: Elderly lady, comfortable in a chair Normocephalic  atraumatic Moist oral mucosa Clear breath sounds bilaterally S1-S2 appreciated Bowel sounds appreciated Skin is warm and dry  Resolved issues   Hypotension  Assessment & Plan:   Acute on 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, obstructive lung disease, restrictive lung disease -Continue oxygen supplementation -Mobilize as tolerated -Bronchodilators -Moderate restrictive lung disease on PFT March 2018, with mild obstruction -Chronic respiratory failure on home oxygen  History of lung cancer for which she had resection and chemotherapy in 2010  WHO group 2 and 3 pulmonary hypertension -Continue OPSUMIT and selexipag  Stage III chronic kidney disease -Trend electrolytes -Replete as needed  Right ankle fracture status post  ORIF -Followed by orthopedics  Type 2 diabetes -SSI  Chronic anemia -Trend CBC  Patient states she is significantly claustrophobic-previous sleep study negative for significant sleep disordered breathing, did have desaturations at night, on oxygen supplementation Not tolerating BiPAP Will discontinue BiPAP Maintain adequate oxygen supplementation  Best practice:  Diet: Heart healthy DVT prophylaxis: SCDs Mobility: Ambulate as tolerated Code Status: Full code Stable from a pulmonary perspective Will discontinue BiPAP  Sherrilyn Rist, MD  [phone]

## 2018-11-29 NOTE — Progress Notes (Signed)
HHC arranged with [facility] ( [place]) [place] as requested. Dan with [facility] called for arrangements; Aneta Mins [phone]   [place]  [phone]   [place] to my Favorites Quality of Patient Care Rating4 out of 5 stars Patient Survey Summary Rating4 out of [place]  [phone]   [place] to my Favorites Quality of Patient Care Rating3 out of 5 stars Patient Survey Summary Rating4 out of [place]  [phone]   [place] to my [place]  out of 5 stars Patient Survey Summary [place] out of [place]  [phone]   [place] to my Favorites Quality of Patient Care Rating4  out of 5 stars Patient Survey Summary Rating4 out of [place]  [phone]   [place] to my Favorites Quality of Patient Care Rating4 out of 5 stars Patient Survey Summary Rating4 out of 5 stars  [place]  [phone]   [place] to my Favorites Quality of Patient Care Rating4 out of 5 stars Patient Survey Summary Rating4 out of 5 stars  [facility] [place]  [phone]   [place] to my Favorites Quality of Patient Care Rating3  out of 5 stars Patient Survey Summary Rating4 out of [place]  [phone]   [place] to my Favorites Quality of Patient Care Rating3 out of 5 stars Patient Survey Summary [place] out of 5 stars  [facility]  [phone]) [phone]   Add [facility] to my Favorites Quality of Patient Care Rating4 out of 5 stars Not Available  [facility]  ([phone]) [phone]   Add [facility] to my Favorites Quality of Patient Care Rating3  out of 5  stars Patient Survey Summary Rating3 out of [place]  [phone]   [place] to my [place] out of 5 stars Patient Survey Summary Rating4 out of [place]  [phone]   [place] to my [place]  out of 5 stars Patient Survey Summary Rating4 out of [place]  [phone]   [place] to my [place]  out of 5 stars Patient Survey Summary Rating3 out of [place]  [phone]

## 2018-11-29 NOTE — Progress Notes (Signed)
Physical Therapy Treatment Patient Details Name: CITLALLI WEIKEL MRN: [id number] DOB: Apr 29, 1946 Today's Date: 11/29/2018    History of Present Illness Pt is a 73 y.o. F with significant PMH of COPD/chronic respiratory failure, severe pulmonary hypertension, chronic diastolic CHF, diabetes, stage III CKD, CAD who presents following a fall and a right trimalleolar ankle fracture. Now s/p ORIF 5/13     PT Comments    Pt pleasant in chair on arrival and reports increased comfort in chair, declined transfers. Pt continues to have great difficulty maintaining balance and NWB on RLE to be able to progress gait. Pt will most likely need to focus on transfers, HEp and W/C mobility. Pt educated for ability to remove CAM boot at least each shift to clean skin and inspect for pressure from boot with RN present and aware. CAM boot removed with skin cleaned and lotioned at beginning of session with pt very appreciative.     Follow Up Recommendations  CIR;Supervision for mobility/OOB     Equipment Recommendations  Wheelchair (measurements PT)    Recommendations for Other Services       Precautions / Restrictions Precautions Precautions: Fall Required Braces or Orthoses: Other Brace Other Brace: R CAM boot Restrictions RLE Weight Bearing: Non weight bearing    Mobility  Bed Mobility               General bed mobility comments: up in chair on arrival and per request returned to chair  Transfers Overall transfer level: Needs assistance   Transfers: Sit to/from Stand Sit to Stand: Min assist         General transfer comment: cues for hand placement and sequence with RLE on P.T. foot to monitor NWB with pt able to stand x 2 trials but lacks control of descent and will simply fall backward to the chair. Pt attempted to hop x 2 with pt able to initiate sequence pushing through arms and gets slight movement of LLE but continues to attempt weight on RLE and reports pain in left toe  limiting ability to hop. Pt declined pivot at this time due to no need to void or return to bed.   Ambulation/Gait             General Gait Details: unable   Stairs             Wheelchair Mobility    Modified Rankin (Stroke Patients Only)       Balance Overall balance assessment: Needs assistance Sitting-balance support: Feet supported Sitting balance-Leahy Scale: Good     Standing balance support: Bilateral upper extremity supported Standing balance-Leahy Scale: Poor Standing balance comment: reliant on UE support unable to maintain RLE NWB                            Cognition Arousal/Alertness: Awake/alert Behavior During Therapy: WFL for tasks assessed/performed Overall Cognitive Status: Within Functional Limits for tasks assessed                                        Exercises General Exercises - Lower Extremity Long Arc Quad: AROM;20 reps;Seated;Right Hip Flexion/Marching: AROM;20 reps;Seated;Right    General Comments        Pertinent Vitals/Pain Pain Assessment: 0-10 Pain Score: 3  Pain Location: sore right ankle and left great toe Pain Descriptors / Indicators: Sore;Aching Pain Intervention(s): Limited  activity within patient's tolerance;Monitored during session;Repositioned    Home Living                      Prior Function            PT Goals (current goals can now be found in the care plan section) Progress towards PT goals: Progressing toward goals    Frequency           PT Plan Current plan remains appropriate    Co-evaluation              AM-PAC PT "6 Clicks" Mobility   Outcome Measure  Help needed turning from your back to your side while in a flat bed without using bedrails?: None Help needed moving from lying on your back to sitting on the side of a flat bed without using bedrails?: A Little Help needed moving to and from a bed to a chair (including a wheelchair)?: A  Lot Help needed standing up from a chair using your arms (e.g., wheelchair or bedside chair)?: A Little Help needed to walk in hospital room?: Total Help needed climbing 3-5 steps with a railing? : Total 6 Click Score: 14    End of Session Equipment Utilized During Treatment: Gait belt;Other (comment)(CAM walker) Activity Tolerance: Patient tolerated treatment well Patient left: with call bell/phone within reach;in chair;with chair alarm set Nurse Communication: Mobility status;Precautions PT Visit Diagnosis: Unsteadiness on feet (R26.81);Other abnormalities of gait and mobility (R26.89);History of falling (Z91.81)     Time: 2415-5161 PT Time Calculation (min) (ACUTE ONLY): 24 min  Charges:  $Therapeutic Exercise: 8-22 mins $Therapeutic Activity: 8-22 mins                     Tamie Minteer Pam Drown, PT Acute Rehabilitation Services Pager: [phone] Office: [place] 11/29/2018, 11:45 AM

## 2018-11-29 NOTE — TOC Progression Note (Signed)
Transition of Care [facility]) - Progression Note    Patient Details  Name: NAHJAE HOEG MRN: [id number] Date of Birth: [birth year]  [facility]) CM/SW [employer], LCSW Phone Number: 11/29/2018, 3:22 PM  Clinical Narrative:  Readmission prevention screening complete.  CSW met with patient, introduced role, and explained that PT recommendations would be discussed. Patient prefers to return home rather than CIR or SNF. She has a daughter and son that live nearby and a grandson that helps her at home. She hopes to be able to plan discharge around his work schedule so that he can pick her up. She has a two-wheel and four-wheel walker at home. She does not feel that her four-wheel walker is stable enough to use. Patient was driving prior to admission. Patient already on 4L oxygen at home. She thinks she has gotten home health through [facility] before and would like to continue with them. No further concerns. CSW encouraged patient to contact CSW as needed. CSW will continue to follow patient for support and facilitate discharge home once medically stable.  Expected Discharge Plan: [place] Barriers to Discharge: No Barriers Identified  Expected Discharge Plan and Services Expected Discharge Plan: [place] In-house Referral: NA Discharge Planning Services: CM Consult Post Acute Care Choice: NA Living arrangements for the past 2 months: Single Family Home                 DME Arranged: N/A DME Agency: NA       HH Arranged: PT HH Agency: [place] ([place]) Date HH Agency Contacted: 11/29/18 Time [place]: 1437 Representative spoke with at [place]: [place]   Social Determinants of Health ([place]) Interventions    Readmission Risk Interventions Readmission Risk Prevention Plan 11/29/2018  Transportation Screening Complete  PCP or Specialist Appt within 3-5 Days Complete  HRI or [place]  Complete  Social Work Consult for [place] Planning/Counseling Complete  Palliative Care Screening Not Applicable  Medication Review Press photographer) Complete  Some recent data might be hidden

## 2018-11-29 NOTE — TOC Initial Note (Signed)
Transition of Care [facility]) - Initial/Assessment Note    Patient Details  Name: Amy Shepard MRN: [id number] Date of Birth: 06-27-1946  Transition of Care [facility]) CM/SW Contact:    Sherrilyn Rist Phone Number: [phone] 11/29/2018, 2:39 PM  Clinical Narrative:                 Patient is refusing Inpt Rehab and SNF placement at this time; she is requesting to return home with [facility] provided by [facility] ( [place]) [place]; Dan RN with [facility] called for arrangements; noted Prevena wound vac in place x 7 days ( wound vac placed 11/23/2018); wheelchair ordered as requested through [facility]; CM will continue to follow for progression of care.  Expected Discharge Plan: [place] Barriers to Discharge: No Barriers Identified   Patient Goals and CMS Choice Patient states their goals for this hospitalization and ongoing recovery are:: to go home and stay there CMS Medicare.gov Compare Post Acute Care list provided to:: Patient Choice offered to / list presented to : Patient  Expected Discharge Plan and Services Expected Discharge Plan: [place] In-house Referral: NA Discharge Planning Services: CM Consult Post Acute Care Choice: NA Living arrangements for the past 2 months: Single Family Home                 DME Arranged: N/A DME Agency: NA       HH Arranged: PT [place] Agency: [place] ([place]) Date [place]: 11/29/18 Time [place]: 1437 Representative spoke with at [place]: Edna  Prior Living Arrangements/Services Living arrangements for the past 2 months: [place] with:: Self(Supportive family)   Do you feel safe going back to the place where you live?: Yes      Need for Family Participation in Patient Care: Yes (Comment) Care giver support system in place?: Yes (comment)   Criminal Activity/Legal Involvement Pertinent to Current Situation/Hospitalization: No - Comment  as needed  Activities of Daily Living Home Assistive Devices/Equipment: Walker (specify type)(rolling) ADL Screening (condition at time of admission) Patient's cognitive ability adequate to safely complete daily activities?: Yes Is the patient deaf or have difficulty hearing?: No Does the patient have difficulty seeing, even when wearing glasses/contacts?: No Does the patient have difficulty concentrating, remembering, or making decisions?: No Patient able to express need for assistance with ADLs?: Yes Does the patient have difficulty dressing or bathing?: Yes Independently performs ADLs?: Yes (appropriate for developmental age) Does the patient have difficulty walking or climbing stairs?: Yes Weakness of Legs: Right Weakness of Arms/Hands: None  Permission Sought/Granted Permission sought to share information with : Case Manager Permission granted to share information with : Yes, Verbal Permission Granted  Share Information with NAME: grandson and daughter  Permission granted to share info w AGENCY: [facility] agency        Emotional Assessment Appearance:: Developmentally appropriate Attitude/Demeanor/Rapport: Gracious, Engaged Affect (typically observed): Accepting Orientation: : Oriented to Self, Oriented to  Time, Oriented to Place, Oriented to Situation Alcohol / Substance Use: Not Applicable Psych Involvement: No (comment)  Admission diagnosis:  Closed displaced fracture of medial malleolus of right tibia, initial encounter [S82.51XA] Fall, initial encounter [W19.XXXA] Closed dislocation of right talus, initial encounter [F02.63ZC] Closed fracture of posterior malleolus of right tibia, initial encounter [S82.391A] Closed fracture of distal end of right fibula, unspecified fracture morphology, initial encounter [H88.502D] Patient Active Problem List   Diagnosis Date Noted  . Acute on chronic  postoperative respiratory failure ([place])   . Postprocedural hypotension   .  Trimalleolar fracture of ankle, closed, right, initial encounter   . Syndesmotic disruption of ankle, right, initial encounter   . Ankle fracture 11/22/2018  . Pain in joint, ankle and foot 04/10/2018  . Closed left fibular fracture 04/10/2018  . CKD (chronic kidney disease), stage IV ([place]) 04/09/2018  . Near syncope 04/09/2018  . Dyspnea   . Bronchitis, acute 03/11/2018  . Fatigue 03/11/2018  . Anemia due to stage 4 chronic kidney disease ([place]) 03/11/2018  . Pulmonary fibrosis ([place]) 03/11/2018  . Pulmonary HTN ([place]) 03/11/2018  . Anemia 03/11/2018  . Lisfranc dislocation, left, subsequent encounter 06/08/2017  . Chronic respiratory failure with hypoxia ([place]) 06/03/2017  . Falls 06/03/2017  . Hypokalemia 06/03/2017  . Sepsis secondary to UTI ([place]) 06/02/2017  . Dog bite 05/02/2017  . CHF exacerbation ([place]) 05/02/2017  . Acute diastolic CHF (congestive heart failure) ([place]) 05/02/2017  . Chronic diastolic CHF (congestive heart failure) ([place]) 04/02/2017  . Cor pulmonale ([place]) 04/02/2017  . Acute on chronic respiratory failure ([place]) 03/10/2017  . Chronic pain 03/10/2017  . Lactic acidosis 02/10/2017  . Acute respiratory failure with hypoxia ([place]) 02/09/2017  . Acute on chronic diastolic CHF (congestive heart failure) ([place]) 06/23/2016  . HTN (hypertension) 06/23/2016  . Type 2 diabetes mellitus with complication, with long-term current use of insulin ([place]) 03/11/2016  . Depression 03/11/2016  . Chronic obstructive pulmonary disease ([place]) 03/10/2016  . S/P lumbar spinal fusion 02/22/2016  . Coronary artery disease involving native coronary artery 07/12/2014  . Pain in the chest   . Malignant neoplasm of lower lobe of right lung ([place]) 05/25/2014  . Lung cancer ([place]) 09/01/2012   PCP:  Maurice Small, MD Pharmacy:   [id number] [place]: [phone]:  [address]Phone: [phone]: [phone] [address] [address]Phone: [phone]: [phone]    Social Determinants of Health (SDOH) Interventions    Readmission Risk Interventions No flowsheet data found.

## 2018-11-29 NOTE — Progress Notes (Signed)
Inpatient Rehab Admissions:  Inpatient Rehab Consult received.  I met with patient at the bedside for rehabilitation assessment and to discuss goals and expectations of an inpatient rehab admission.  Pt is not interested in any inpatient rehab at discharge (CIR or SNF).  She is adamant that she will return home with home therapies and occasional assist from her grandson.  I discussed with CM and let Dr. Broadus John know. I will sign off at this time.   Signed: Shann Medal, PT, DPT Admissions Coordinator [phone] 11/29/18  2:21 PM

## 2018-11-29 NOTE — Progress Notes (Signed)
PROGRESS NOTE    Amy Shepard  POE:[id number] DOB: 04-05-1946 DOA: 11/22/2018 PCP: Maurice Small, MD    Brief Narrative: 73 year old female with history of COPD/chronic respiratory failure on 4 L O2, severe pulmonary hypertension, gait disorder, chronic diastolic CHF, diabetes, hypertension, stage III CKD, CAD presented to the ED following a mechanical fall she was diagnosed with a right ankle fracture which was reduced under conscious sedation and splinted, orthopedics was consulted, underwent ORIF of right ankle 5/12 Postop course complicated by worsening hypoxia and hypotension requiring dopamine and BiPAP: -Slowly improving, off dopamine and BiPAP, no respiratory distress but difficulty weaning down O2 -5/17 transferred out of ICU, O2 weaned down to 6 to 7 L  Assessment & Plan:   1-Right ankle fracture: -Ortho consulted, underwent ORIF 5/12-by Dr. Sharol Given -Postop course complicated by hypotension, hypoxia and anemia -Orthopedics recommended to continue wound VAC for 1 week, nonweightbearing for 3 weeks and follow-up in 1 week -Physical therapy following, CIR recommended for rehab -Rehab consult pending  2.  Acute on chronic hypoxic respiratory failure -Patient has advanced COPD with chronic respiratory failure requiring 4 L O2 and severe pulmonary hypertension, acute on chronic diastolic CHF -Required BiPAP until 5/14 a.m., now off, subsequently was requiring 15 L high flow nasal cannula to keep O2 sats near 90% -Diuresed with IV Lasix postop -Volume status improved, transition to oral Lasix -Encouraged incentive spirometer and increased activity -Chest x-ray is unremarkable -Weaned down to 4 L this morning which is her baseline  3.  Acute on chronic hypotension -Postop likely secondary to blood loss anemia, and pulmonary hypertension/mild volume overload, nitric oxide -weaned off dopamine -Patient has chronic hypotension and takes midodrine 5 mg 3 times daily at baseline  -Restarted midodrine  4.  Severe chronic pulmonary hypertension -needs CODE STATUS discussion -Restarted selexipag and macitentan,   5.  Acute on chronic diastolic CHF -Diuresed with IV Lasix, now transitioned to oral diuretics -Volume status much improved  6-COPD with chronic hypoxic respiratory failure: On 4 L home O2  -As above  7-type 2 diabetes with hyperglycemia: -CBGs are stable, long-acting insulin was on hold will restart today  Stage III chronic kidney disease: Creatinine baseline; 1.2--- 1.3. Continue to monitor closely  DVT prophylaxis: Add subcutaneous heparin Code Status: Full code Family Communication: No family at bedside , left message for daughter Raymondo Band 5/17 and 5/15 Disposition Plan: CIR when bed available  Consultants:   Dr. Sharol Given  PCCM   Procedures:  None   Antimicrobials:   Clindamycin preop   Subjective: -Feels well, no complaints, breathing better  Objective: Vitals:   11/29/18 0755 11/29/18 0828 11/29/18 0850 11/29/18 1211  BP: 115/68 (!) 89/44  (!) 100/53  Pulse: 82 82  77  Resp: 15   17  Temp: 98.6 F (37 C) 98.2 F (36.8 C)  98.4 F (36.9 C)  TempSrc: Oral Oral  Oral  SpO2: 92% 94% 92% 93%  Weight:      Height:        Intake/Output Summary (Last 24 hours) at 11/29/2018 1308 Last data filed at 11/29/2018 0851 Gross per 24 hour  Intake 920 ml  Output 900 ml  Net 20 ml   Filed Weights   11/23/18 1227 11/28/18 1042 11/29/18 0404  Weight: 82.6 kg 79.9 kg 79.4 kg    Examination:  Gen: Awake, Alert, Oriented X 3, no distress HEENT: PERRLA, Neck supple, no JVD Lungs: Improved air movement, clear bilaterally CVS: RRR,No Gallops,Rubs or new Murmurs Abd:  soft, Non tender, non distended, BS present Extremities: Right ankle with wound VAC Skin: no new rashes   Data Reviewed: I have personally reviewed following labs and imaging studies  CBC: Recent Labs  Lab 11/22/18 1615  11/24/18 0804 11/25/18 0451  11/26/18 0323 11/27/18 0451 11/29/18 0550  WBC 7.3   < > 7.6 7.8 8.6 7.2 7.5  NEUTROABS 5.3  --   --   --   --   --   --   HGB 8.2*   < > 7.9* 8.4* 8.2* 8.5* 8.6*  HCT 28.0*   < > 26.4* 27.2* 27.4* 28.0* 28.6*  MCV 79.3*   < > 79.3* 77.5* 78.7* 78.7* 79.4*  PLT 205   < > 164 161 177 198 245   < > = values in this interval not displayed.   Basic Metabolic Panel: Recent Labs  Lab 11/25/18 0451 11/26/18 0323 11/27/18 0451 11/28/18 0422 11/29/18 0550  NA 141 140 139 135 138  K 4.0 3.3* 3.8 3.8 4.4  CL 102 100 100 98 101  CO2 _0 GLUCOSE 159* 149* 227* 232* 185*  BUN 27* 23 24* 23 24*  CREATININE 1.60* 1.36* 1.27* 1.26* 1.38*  CALCIUM 9.4 9.0 8.9 8.9 9.0  MG  --  2.0  --   --   --   PHOS  --  2.8  --   --   --    GFR: Estimated Creatinine Clearance: 35.1 mL/min (A) (by C-G formula based on SCr of 1.38 mg/dL (H)). Liver Function Tests: Recent Labs  Lab 11/25/18 0451  AST 20  ALT 11  ALKPHOS 125  BILITOT 0.8  PROT 6.1*  ALBUMIN 3.1*   No results for input(s): LIPASE, AMYLASE in the last 168 hours. No results for input(s): AMMONIA in the last 168 hours. Coagulation Profile: No results for input(s): INR, PROTIME in the last 168 hours. Cardiac Enzymes: No results for input(s): CKTOTAL, CKMB, CKMBINDEX, TROPONINI in the last 168 hours. BNP (last 3 results) No results for input(s): PROBNP in the last 8760 hours. HbA1C: No results for input(s): HGBA1C in the last 72 hours. CBG: Recent Labs  Lab 11/28/18 1142 11/28/18 1656 11/28/18 2125 11/29/18 0612 11/29/18 1208  GLUCAP 302* 226* 249* 179* 174*   Lipid Profile: No results for input(s): CHOL, HDL, LDLCALC, TRIG, CHOLHDL, LDLDIRECT in the last 72 hours. Thyroid Function Tests: No results for input(s): TSH, T4TOTAL, FREET4, T3FREE, THYROIDAB in the last 72 hours. Anemia Panel: No results for input(s): VITAMINB12, FOLATE, FERRITIN, TIBC, IRON, RETICCTPCT in the last 72 hours. Sepsis Labs: No results  for input(s): PROCALCITON, LATICACIDVEN in the last 168 hours.  Recent Results (from the past 240 hour(s))  SARS Coronavirus 2 (CEPHEID - Performed in [place] hospital lab), Hosp Order     Status: None   Collection Time: 11/22/18  5:34 PM  Result Value Ref Range Status   SARS Coronavirus 2 NEGATIVE NEGATIVE Final    Comment: (NOTE) If result is NEGATIVE SARS-CoV-2 target nucleic acids are NOT DETECTED. The SARS-CoV-2 RNA is generally detectable in upper and lower  respiratory specimens during the acute phase of infection. The lowest  concentration of SARS-CoV-2 viral copies this assay can detect is 250  copies / mL. A negative result does not preclude SARS-CoV-2 infection  and should not be used as the sole basis for treatment or other  patient management decisions.  A negative result may occur with  improper specimen collection / handling,  submission of specimen other  than nasopharyngeal swab, presence of viral mutation(s) within the  areas targeted by this assay, and inadequate number of viral copies  (<250 copies / mL). A negative result must be combined with clinical  observations, patient history, and epidemiological information. If result is POSITIVE SARS-CoV-2 target nucleic acids are DETECTED. The SARS-CoV-2 RNA is generally detectable in upper and lower  respiratory specimens dur ing the acute phase of infection.  Positive  results are indicative of active infection with SARS-CoV-2.  Clinical  correlation with patient history and other diagnostic information is  necessary to determine patient infection status.  Positive results do  not rule out bacterial infection or co-infection with other viruses. If result is PRESUMPTIVE POSTIVE SARS-CoV-2 nucleic acids MAY BE PRESENT.   A presumptive positive result was obtained on the submitted specimen  and confirmed on repeat testing.  While 2019 novel coronavirus  (SARS-CoV-2) nucleic acids may be present in the submitted  sample  additional confirmatory testing may be necessary for epidemiological  and / or clinical management purposes  to differentiate between  SARS-CoV-2 and other Sarbecovirus currently known to infect humans.  If clinically indicated additional testing with an alternate test  methodology [id number]) is advised. The SARS-CoV-2 RNA is generally  detectable in upper and lower respiratory sp ecimens during the acute  phase of infection. The expected result is Negative. Fact Sheet for Patients:  StrictlyIdeas.no Fact Sheet for Healthcare Providers: BankingDealers.co.za This test is not yet approved or cleared by the [place] FDA and has been authorized for detection and/or diagnosis of SARS-CoV-2 by FDA under an Emergency Use Authorization (EUA).  This EUA will remain in effect (meaning this test can be used) for the duration of the COVID-19 declaration under Section 564(b)(1) of the Act, 21 U.S.C. section 360bbb-3(b)(1), unless the authorization is terminated or revoked sooner. Performed at [facility], [place] [address], [place], [place] [address]   MRSA PCR Screening     Status: None   Collection Time: 11/22/18 11:35 PM  Result Value Ref Range Status   MRSA by PCR NEGATIVE NEGATIVE Final    Comment:        The GeneXpert MRSA Assay (FDA approved for NASAL specimens only), is one component of a comprehensive MRSA colonization surveillance program. It is not intended to diagnose MRSA infection nor to guide or monitor treatment for MRSA infections. Performed at [facility], [place] [address], [place], [place] [address]          Radiology Studies: No results found.      Scheduled Meds: . atorvastatin  40 mg Oral Daily  . chlorhexidine  15 mL Mouth Rinse BID  . dextromethorphan-guaiFENesin  2 tablet Oral BID  . docusate sodium  100 mg Oral BID  . furosemide  20 mg Oral BID  . gabapentin  300 mg  Oral QHS  . gabapentin  300 mg Oral TID  . heparin injection (subcutaneous)  5,000 Units Subcutaneous Q8H  . insulin aspart  0-9 Units Subcutaneous TID WC  . insulin detemir  18 Units Subcutaneous QHS  . macitentan  10 mg Oral Daily  . midodrine  5 mg Oral TID WC  . mometasone-formoterol  2 puff Inhalation BID  . Selexipag  400 mcg Oral BID  . venlafaxine XR  75 mg Oral Q breakfast   Continuous Infusions: . sodium chloride Stopped (11/24/18 2122)  . methocarbamol (ROBAXIN) IV       LOS: 7 days  Time spent: 25 minutes.     Domenic Polite, MD [facility]  11/29/2018, 1:08 PM

## 2018-11-30 DIAGNOSIS — S82891D Other fracture of right lower leg, subsequent encounter for closed fracture with routine healing: Secondary | ICD-10-CM

## 2018-11-30 DIAGNOSIS — S9304XA Dislocation of right ankle joint, initial encounter: Secondary | ICD-10-CM

## 2018-11-30 LAB — GLUCOSE, CAPILLARY
Glucose-Capillary: 157 mg/dL — ABNORMAL HIGH (ref 70–99)
Glucose-Capillary: 148 mg/dL — ABNORMAL HIGH (ref 70–99)
Glucose-Capillary: 138 mg/dL — ABNORMAL HIGH (ref 70–99)
Glucose-Capillary: 214 mg/dL — ABNORMAL HIGH (ref 70–99)

## 2018-11-30 MED ORDER — GABAPENTIN 300 MG PO CAPS: 300.0000 mg | ORAL_CAPSULE | Freq: Every day | ORAL | Status: DC

## 2018-11-30 MED ORDER — SELEXIPAG 200 MCG PO TABS: 400.0000 ug | ORAL_TABLET | Freq: Two times a day (BID) | ORAL | Status: DC

## 2018-11-30 MED ORDER — INSULIN DETEMIR 100 UNIT/ML FLEXPEN: 20.0000 [IU] | PEN_INJECTOR | Freq: Every day | SUBCUTANEOUS | Status: DC

## 2018-11-30 MED ORDER — POLYETHYLENE GLYCOL 3350 17 G PO PACK: 17.0000 g | PACK | Freq: Every day | ORAL | 0 refills | Status: DC | PRN

## 2018-11-30 NOTE — Progress Notes (Addendum)
Inpatient Rehab Admissions Coordinator:   I met with pt at bedside per PT request.  She is now interested in CIR program and states she will have some family stay with her at d/c.  I will open up her insurance for possible admission pending authorization.   Addendum: I have spoken with pt's daughter who works full time.  Pt would need to be at least mod I w/c level for admission to rehab.  I've spoken with pt's PT and she feels this is possible with CIR stay.  Insurance authorization pending.  I do not have a bed available for admission today.  I will continue to follow for possible admission tomorrow pending bed availability and insurance approval.  Shann Medal, PT, DPT Admissions Coordinator [phone] 11/30/18  11:49 AM

## 2018-11-30 NOTE — Progress Notes (Signed)
Physical Therapy Treatment Patient Details Name: JIHAN MELLETTE MRN: [id number] DOB: September 27, 1945 Today's Date: 11/30/2018    History of Present Illness Pt is a 73 y.o. F with significant PMH of COPD/chronic respiratory failure, severe pulmonary hypertension, chronic diastolic CHF, diabetes, stage III CKD, CAD who presents following a fall and a right trimalleolar ankle fracture. Now s/p ORIF 5/13     PT Comments    Pt pleasant and wanting to return home. However, after session and pt's recognition of her deficits how much assist she requires for transfers and the rapid fatigue she demonstrates with W/C mobility pt not safe to return home without assist for all mobility which she does not have. Pt unable to manipulate W/C parts, perform tranfers without min-mod assist and does not maintain NWB on RLE without physical assist. Pt educated for her deficits and how fatigued and challenged she would be at home functioning at this level. Pt also educated for need to maintain NWB status for ankle to heal properly. Pt verbalized understanding and was agreeable to rehab end of session. Will continue to work toward increased ability with transfer and W/C mobility.     Follow Up Recommendations  CIR;Supervision for mobility/OOB     Equipment Recommendations  Wheelchair (measurements PT);3in1 (PT)    Recommendations for Other Services       Precautions / Restrictions Precautions Precautions: Fall Other Brace: R CAM boot Restrictions Weight Bearing Restrictions: Yes RLE Weight Bearing: Non weight bearing    Mobility  Bed Mobility               General bed mobility comments: in recliner on arrival  Transfers Overall transfer level: Needs assistance   Transfers: Squat Pivot Transfers     Squat pivot transfers: Min assist;Mod assist;+2 safety/equipment     General transfer comment: pt in recliner on arrival with W/C placed to pt's left. Initial transfer to left pt with min +2  assist with RLE propped on P.T. foot to control NWB as pt continues to try to attempt pushing through right foot. Sequential cues with assist to reach for armrest and max assist for management of W/C parts. Return transfer to pt's right with RLE on P.T. foot and increased assist of mod assist to transition W/C to recline with sequential cues and decreased control of descent  Ambulation/Gait             General Gait Details: unable   Theme park manager mobility: Yes Wheelchair propulsion: Both upper extremities;Left lower extremity Wheelchair parts: Needs assistance Distance: 10'  Wheelchair Assistance Details (indicate cue type and reason): pt required max assist to manage bil legrest, max cues for brakes and physical assist to move W/C around corners. Pt fatigued after 50' of W/C ambulation and unable to progress further  Modified Rankin (Stroke Patients Only)       Balance Overall balance assessment: Needs assistance Sitting-balance support: Feet supported Sitting balance-Leahy Scale: Good     Standing balance support: Bilateral upper extremity supported Standing balance-Leahy Scale: Poor Standing balance comment: reliant on UE support unable to maintain RLE NWB                            Cognition Arousal/Alertness: Awake/alert Behavior During Therapy: WFL for tasks assessed/performed Overall Cognitive Status: Impaired/Different from baseline Area of Impairment: Safety/judgement  Safety/Judgement: Decreased awareness of deficits;Decreased awareness of safety     General Comments: pt lacks insight into deficits and significant level of assist for mobility and inability to return home alone.       Exercises      General Comments        Pertinent Vitals/Pain Pain Score: 4  Pain Location: sore right ankle and left great toe Pain Descriptors / Indicators:  Sore;Aching Pain Intervention(s): Limited activity within patient's tolerance;Monitored during session;Repositioned    Home Living                      Prior Function            PT Goals (current goals can now be found in the care plan section) Progress towards PT goals: Progressing toward goals    Frequency    Min 5X/week      PT Plan Current plan remains appropriate    Co-evaluation              AM-PAC PT "6 Clicks" Mobility   Outcome Measure  Help needed turning from your back to your side while in a flat bed without using bedrails?: None Help needed moving from lying on your back to sitting on the side of a flat bed without using bedrails?: A Little Help needed moving to and from a bed to a chair (including a wheelchair)?: A Lot Help needed standing up from a chair using your arms (e.g., wheelchair or bedside chair)?: A Little Help needed to walk in hospital room?: Total Help needed climbing 3-5 steps with a railing? : Total 6 Click Score: 14    End of Session Equipment Utilized During Treatment: Gait belt;Oxygen Activity Tolerance: Patient tolerated treatment well Patient left: with call bell/phone within reach;in chair;with chair alarm set Nurse Communication: Mobility status;Precautions;Weight bearing status PT Visit Diagnosis: Unsteadiness on feet (R26.81);Other abnormalities of gait and mobility (R26.89);History of falling (Z91.81)     Time: 8288-3374 PT Time Calculation (min) (ACUTE ONLY): 38 min  Charges:  $Therapeutic Activity: 8-22 mins $Wheel Chair Management: 23-37 mins                     Jyla Hopf Pam Drown, PT Acute Rehabilitation Services Pager: [phone] Office: [place] 11/30/2018, 10:17 AM

## 2018-11-30 NOTE — TOC Progression Note (Signed)
Transition of Care [facility]) - Progression Note    Patient Details  Name: Amy Shepard MRN: [id number] Date of Birth: 1945/08/31  Transition of Care [facility]) CM/SW Contact  Royston Bake, RN Phone Number: 11/30/2018, 10:42 AM  Clinical Narrative:    Patient is now considering rehab Inpatient rehab vs SNF placement; Inpt Rehab to talk to patient again. Attending MD updated.   Expected Discharge Plan: [place] Barriers to Discharge: No Barriers Identified  Expected Discharge Plan and Services Expected Discharge Plan: [place] In-house Referral: NA Discharge Planning Services: CM Consult Post Acute Care Choice: NA Living arrangements for the past 2 months: Single Family Home Expected Discharge Date: 11/30/18               DME Arranged: N/A DME Agency: NA       HH Arranged: PT [place] Agency: [facility]) Date HH Agency Contacted: 11/29/18 Time [place]: 1437 Representative spoke with at [place]: Dan RN   Social Determinants of Health ([place]) Interventions    Readmission Risk Interventions Readmission Risk Prevention Plan 11/29/2018  Transportation Screening Complete  PCP or Specialist Appt within 3-5 Days Complete  HRI or [place] Complete  Social Work Consult for [place] Planning/Counseling Complete  Palliative Care Screening Not Applicable  Medication Review Press photographer) Complete  Some recent data might be hidden

## 2018-11-30 NOTE — Plan of Care (Signed)
Progressing toward goals.

## 2018-11-30 NOTE — Care Management Important Message (Signed)
Important Message  Patient Details  Name: Amy Shepard MRN: [id number] Date of Birth: Apr 24, 1946   Medicare Important Message Given:  Yes    Orbie Pyo 11/30/2018, 2:37 PM

## 2018-11-30 NOTE — Discharge Summary (Signed)
Physician Discharge Summary  Amy Shepard DXA:[id number] DOB: [birth year] DOA: 11/22/2018  PCP: Maurice Small, MD  Admit date: 11/22/2018 Discharge date: 11/30/2018  Time spent: 35 minutes  Recommendations for Outpatient Follow-up:  1. Orthopedics Dr.Duda in 1 week 2. PCP in 1 week   Discharge Diagnoses:  Principal Problem:   Ankle fracture   Chronic resp failure   Severe COPD   Chronic obstructive pulmonary disease ([name])   Type 2 diabetes mellitus with complication, with long-term current use of insulin ([name])   HTN (hypertension)   Chronic diastolic CHF (congestive heart failure) ([name])   Cor pulmonale ([name])   Pulmonary HTN ([name])   Trimalleolar fracture of ankle, closed, right, initial encounter   Syndesmotic disruption of ankle, right, initial encounter   Acute on chronic postoperative respiratory failure ([name])   Postprocedural hypotension   Closed dislocation of right talus   Discharge Condition: stable  Diet recommendation: low sodium heart healthy  Filed Weights   11/28/18 1042 11/29/18 0404 11/30/18 0310  Weight: 79.9 kg 79.4 kg 80.2 kg    History of present illness:  73 year old female with history of COPD/chronic respiratory failure on 4 L O2, severe pulmonary hypertension, gait disorder, chronic diastolic CHF, diabetes, hypertension, stage III CKD, CAD presented to the ED following a mechanical fall she was diagnosed with a right ankle fracture  Hospital Course:   1-Right ankle fracture: -Ortho consulted, underwent ORIF 5/12-by Dr. Sharol Given -Postop course complicated by hypotension, hypoxia and anemia -Orthopedics recommended to continue wound VAC for 1 week, nonweightbearing for 3 weeks and follow-up in 1 week -Physical therapy following, CIR recommended for rehabilitation  2.  Acute on chronic hypoxic respiratory failure -Patient has advanced COPD with chronic respiratory failure requiring 4 L O2 and severe pulmonary hypertension, acute on chronic  diastolic CHF -Required BiPAP until 5/14 a.m., now off, subsequently was requiring 15 L high flow nasal cannula to keep O2 sats near 90% -Diuresed with IV Lasix postop -Volume status improved, transitioned to oral Lasix -Encouraged incentive spirometer and increased activity -Chest x-ray is unremarkable -Weaned down to 4 L which is her baseline  3.  Acute on chronic hypotension -Postop likely secondary to blood loss anemia, and pulmonary hypertension/mild volume overload, nitric oxide -weaned off dopamine -Patient has chronic hypotension and takes midodrine 5 mg 3 times daily at baseline -Restarted midodrine  4.  Severe chronic pulmonary hypertension -Restarted selexipag and macitentan,  -FU with [facility]  5.  Acute on chronic diastolic CHF -Diuresed with IV Lasix, now transitioned to oral diuretics -Volume status much improved  6-COPD with chronic hypoxic respiratory failure: On 4 L home O2  -As above  7-type 2 diabetes with hyperglycemia: -CBGs are stable, long-acting insulin was resumed at a lower dose  Stage III chronic kidney disease: Creatinine baseline; 1.2--- 1.3. Continue to monitor closely  Discharge Exam: Vitals:   11/30/18 0457 11/30/18 0859  BP: (!) 98/46   Pulse: 78   Resp: 20   Temp: 98.4 F (36.9 C)   SpO2: 93% 93%    General: AAOx3 Cardiovascular: S1S2/RRR Respiratory: CTAB  Discharge Instructions   Discharge Instructions    Diet - low sodium heart healthy   Complete by:  As directed    Increase activity slowly   Complete by:  As directed      Allergies as of 11/30/2018      Reactions   Amoxicillin Anaphylaxis, Hives, Rash, Other (See Comments)   Has patient had a PCN reaction causing immediate rash,  facial/tongue/throat swelling, SOB or lightheadedness with hypotension: YES Positive reaction causing SEVERE RASH INVOLVING MUCUS MEMBRANES/SKIN NECROSIS: YES Reaction that required HOSPITALIZATION: YES Reaction occurring  within the last 10 years: NO      Medication List    TAKE these medications   acetaminophen 325 MG tablet Commonly known as:  TYLENOL Take 650 mg by mouth every 6 (six) hours as needed for moderate pain or fever.   atorvastatin 40 MG tablet Commonly known as:  LIPITOR Take 1 tablet (40 mg total) by mouth daily. What changed:  Another medication with the same name was removed. Continue taking this medication, and follow the directions you see here.   Fluticasone-Salmeterol 250-50 MCG/DOSE Aepb Commonly known as:  ADVAIR Inhale 1 puff into the lungs 2 (two) times daily as needed (shortness of breath/wheezing).   furosemide 40 MG tablet Commonly known as:  LASIX Take 1 tablet (40 mg total) by mouth 2 (two) times daily.   gabapentin 300 MG capsule Commonly known as:  NEURONTIN Take 1 capsule (300 mg total) by mouth at bedtime. What changed:  when to take this   Insulin Detemir 100 UNIT/ML Pen Commonly known as:  Levemir FlexTouch Inject 20 Units into the skin at bedtime. What changed:  how much to take   insulin lispro 100 UNIT/ML injection Commonly known as:  HUMALOG Inject 2-11 Units into the skin See admin instructions. Inject 6 units with each meal and add additional units per sliding scale: BGL 101-150 = 2 units; 151-200 = 3 units; 201-250 = 5 units; 251-300 = 7 units; 301-350 = 9 units; >350 = 11 units; CALL MD IF BGL IS >400 OR <60   ipratropium-albuterol 0.5-2.5 (3) MG/3ML Soln Commonly known as:  DUONEB Take 3 mLs by nebulization every 4 (four) hours as needed (wheezing, Shortness of breath).   macitentan 10 MG tablet Commonly known as:  Opsumit Take 1 tablet (10 mg total) by mouth daily.   midodrine 5 MG tablet Commonly known as:  PROAMATINE Take 1 tablet (5 mg total) by mouth 3 (three) times daily with meals.   OXYGEN Inhale 3 L into the lungs continuous. FOR COPD   polyethylene glycol 17 g packet Commonly known as:  MIRALAX / GLYCOLAX Take 17 g by mouth  daily as needed for mild constipation.   potassium chloride SA 20 MEQ tablet Commonly known as:  K-DUR Take 2 tablets (40 mEq total) by mouth daily. What changed:    when to take this  reasons to take this   Selexipag 200 MCG Tabs Commonly known as:  Uptravi Take 2 tablets (400 mcg total) by mouth 2 (two) times a day.   traMADol 50 MG tablet Commonly known as:  ULTRAM Take 1 tablet (50 mg total) by mouth every 8 (eight) hours as needed (for pain).   Trulicity 1.5 BJ/4.7WG Sopn Generic drug:  Dulaglutide Inject 1.5 mg into the vein once a week.   venlafaxine XR 75 MG 24 hr capsule Commonly known as:  EFFEXOR-XR Take 75 mg by mouth daily with breakfast.            Durable Medical Equipment  (From admission, onward)         Start     Ordered   11/29/18 1440  For home use only DME standard manual wheelchair with seat cushion  Once    Comments:  Patient suffers from COPD and ankle fracture which impairs their ability to perform daily activities like walking in the home.  A cane or walker will not resolve  issue with performing activities of daily living. A wheelchair will allow patient to safely perform daily activities. Patient can safely propel the wheelchair in the home or has a caregiver who can provide assistance.  Accessories: elevating leg rests (ELRs), wheel locks, extensions and anti-tippers.   11/29/18 1440   11/29/18 1430  For home use only DME lightweight manual wheelchair with seat cushion  Once    Comments:  Patient suffers from ORIF Rt ankle, NWB which impairs their ability to perform daily activities like walking in the home.  A wheelchair will not resolve  issue with performing activities of daily living. A wheelchair will allow patient to safely perform daily activities. Patient is not able to propel themselves in the home using a standard weight wheelchair due to weakness, NWB rt ankle. Patient can self propel in the lightweight wheelchair.  Accessories:  elevating leg rests (ELRs), wheel locks, extensions and anti-tippers.   11/29/18 1430         Allergies  Allergen Reactions  . Amoxicillin Anaphylaxis, Hives, Rash and Other (See Comments)    Has patient had a PCN reaction causing immediate rash, facial/tongue/throat swelling, SOB or lightheadedness with hypotension: YES Positive reaction causing SEVERE RASH INVOLVING MUCUS MEMBRANES/SKIN NECROSIS: YES Reaction that required HOSPITALIZATION: YES Reaction occurring within the last 10 years: NO   Follow-up Information    Newt Minion, MD In 1 week.   Specialty:  Orthopedic Surgery Contact information: [place] [address] [phone]        [facility] Follow up.   Specialty:  Home Health Services Why:  They will do your home health care at your home       Maurice Small, MD. Go on 12/08/2018.   Specialty:  Family Medicine Why:  _0 :15am please call on 5/26 for instructions for portal visit Contact information: [place] [address] [phone]            The results of significant diagnostics from this hospitalization (including imaging, microbiology, ancillary and laboratory) are listed below for reference.    Significant Diagnostic Studies: Dg Ankle 2 Views Right  Result Date: 11/22/2018 CLINICAL DATA:  Status post reduction for fracture/dislocation EXAM: RIGHT ANKLE - 2 VIEW COMPARISON:  Nov 22, 2018 study obtained earlier in the day FINDINGS: Frontal and lateral views obtained. Images obtained in plaster. There is a fracture the distal fibular diaphysis with mild posterior angulation distally. There is approximately 8 mm of overriding of fracture fragments. There is avulsion of the medial malleolus. Compared to study obtained earlier in the day, there is less ankle mortise disruption. The talus is no longer displaced entirely posterior to the tibial plafond. However, there does remain  mild lateral and posterior displacement of the talus with respect to the plafond and. 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 IMPRESSION: There is a persistent degree of lateral and posterior displacement of the talus with respect to the tibial plafond. The gross posterior displacement of the talus with respect to the tibial plafond seen prior to reduction is no longer evident. There are again noted fractures of the distal fibula and medial malleolar regions. No new fractures are demonstrated. There may be slightly less displacement of the distal medial malleolar fracture fragment compared to the more proximal fragment in comparison with study obtained earlier in the day. Electronically Signed   By: Lowella Grip III M.D.  On: 11/22/2018 16:11   Dg Ankle Complete Right  Result Date: 11/22/2018 CLINICAL DATA:  Right ankle pain after fall. EXAM: RIGHT ANKLE - COMPLETE 3+ VIEW COMPARISON:  None. FINDINGS: Severely angulated and comminuted fracture of right distal fibula is noted. There is posterior dislocation of the talus relative to the tibia. Probable moderately displaced posterior malleolar fracture is noted. Moderately displaced medial malleolar fracture is noted as well. IMPRESSION: Posterior dislocation of talus relative to distal tibia with associated fractures of the distal right fibula, medial malleolus and posterior malleolus. Electronically Signed   By: Marijo Conception M.D.   On: 11/22/2018 14:48   Dg Chest Port 1 View  Result Date: 11/25/2018 CLINICAL DATA:  73 year old female with history of lung cancer. Fall. Shortness of breath. EXAM: PORTABLE CHEST 1 VIEW COMPARISON:  11/24/2018 and earlier. FINDINGS: Portable AP semi upright view at 0516 hours. Stable lung volumes. Stable cardiomegaly and mediastinal contours. Calcified aortic atherosclerosis. Visualized tracheal air column is within normal limits. Chronic postoperative changes in the right lower lung. Chronic  increased pulmonary interstitial markings, mildly decreased coarse interstitial opacity since yesterday. Ventilation now appears to be at baseline including in the left lower lobe where the hemidiaphragm is again visible. No pneumothorax or pleural effusion. No acute osseous abnormality identified. IMPRESSION: 1. Improved ventilation since yesterday, lungs now appear at baseline. 2. Cardiomegaly.  Aortic Atherosclerosis (ICD10-I70.0). Electronically Signed   By: Genevie Ann M.D.   On: 11/25/2018 08:14   Dg Chest Port 1 View  Result Date: 11/24/2018 CLINICAL DATA:  Hypoxia. Shortness of breath and chest pain. History of lung cancer. EXAM: PORTABLE CHEST 1 VIEW COMPARISON:  CTA chest and chest x-ray dated August 18, 2018. FINDINGS: Portions of the left lung base are excluded from the field of view. Stable cardiomegaly. Atherosclerotic calcification of the aortic arch. Coarsened interstitial markings are similar to prior study. Postsurgical changes in the right lung again noted. No focal consolidation, pleural effusion, or pneumothorax. No acute osseous abnormality. IMPRESSION: No active disease. Stable cardiomegaly and chronic interstitial changes. Electronically Signed   By: Titus Dubin M.D.   On: 11/24/2018 10:05    Microbiology: Recent Results (from the past 240 hour(s))  SARS Coronavirus 2 (CEPHEID - Performed in [facility] hospital lab), Hosp Order     Status: None   Collection Time: 11/22/18  5:34 PM  Result Value Ref Range Status   SARS Coronavirus 2 NEGATIVE NEGATIVE Final    Comment: (NOTE) If result is NEGATIVE SARS-CoV-2 target nucleic acids are NOT DETECTED. The SARS-CoV-2 RNA is generally detectable in upper and lower  respiratory specimens during the acute phase of infection. The lowest  concentration of SARS-CoV-2 viral copies this assay can detect is 250  copies / mL. A negative result does not preclude SARS-CoV-2 infection  and should not be used as the sole basis for treatment  or other  patient management decisions.  A negative result may occur with  improper specimen collection / handling, submission of specimen other  than nasopharyngeal swab, presence of viral mutation(s) within the  areas targeted by this assay, and inadequate number of viral copies  (<250 copies / mL). A negative result must be combined with clinical  observations, patient history, and epidemiological information. If result is POSITIVE SARS-CoV-2 target nucleic acids are DETECTED. The SARS-CoV-2 RNA is generally detectable in upper and lower  respiratory specimens dur ing the acute phase of infection.  Positive  results are indicative of active infection with SARS-CoV-2.  Clinical  correlation with patient history and other diagnostic information is  necessary to determine patient infection status.  Positive results do  not rule out bacterial infection or co-infection with other viruses. If result is PRESUMPTIVE POSTIVE SARS-CoV-2 nucleic acids MAY BE PRESENT.   A presumptive positive result was obtained on the submitted specimen  and confirmed on repeat testing.  While 2019 novel coronavirus  (SARS-CoV-2) nucleic acids may be present in the submitted sample  additional confirmatory testing may be necessary for epidemiological  and / or clinical management purposes  to differentiate between  SARS-CoV-2 and other Sarbecovirus currently known to infect humans.  If clinically indicated additional testing with an alternate test  methodology [phone]) is advised. The SARS-CoV-2 RNA is generally  detectable in upper and lower respiratory sp ecimens during the acute  phase of infection. The expected result is Negative. Fact Sheet for Patients:  StrictlyIdeas.no Fact Sheet for Healthcare Providers: BankingDealers.co.za This test is not yet approved or cleared by the [place] FDA and has been authorized for detection and/or diagnosis of  SARS-CoV-2 by FDA under an Emergency Use Authorization (EUA).  This EUA will remain in effect (meaning this test can be used) for the duration of the COVID-19 declaration under Section 564(b)(1) of the Act, 21 U.S.C. section 360bbb-3(b)(1), unless the authorization is terminated or revoked sooner. Performed at [facility], [place] [address] [place], [place] [address]   MRSA PCR Screening     Status: None   Collection Time: 11/22/18 11:35 PM  Result Value Ref Range Status   MRSA by PCR NEGATIVE NEGATIVE Final    Comment:        The GeneXpert MRSA Assay (FDA approved for NASAL specimens only), is one component of a comprehensive MRSA colonization surveillance program. It is not intended to diagnose MRSA infection nor to guide or monitor treatment for MRSA infections. Performed at [facility], [place] [address], [place], [place] [address]      Labs: Basic Metabolic Panel: Recent Labs  Lab 11/25/18 0451 11/26/18 0323 11/27/18 0451 11/28/18 0422 11/29/18 0550  NA 141 140 139 135 138  K 4.0 3.3* 3.8 3.8 4.4  CL 102 100 100 98 101  CO2 _0 GLUCOSE 159* 149* 227* 232* 185*  BUN 27* 23 24* 23 24*  CREATININE 1.60* 1.36* 1.27* 1.26* 1.38*  CALCIUM 9.4 9.0 8.9 8.9 9.0  MG  --  2.0  --   --   --   PHOS  --  2.8  --   --   --    Liver Function Tests: Recent Labs  Lab 11/25/18 0451  AST 20  ALT 11  ALKPHOS 125  BILITOT 0.8  PROT 6.1*  ALBUMIN 3.1*   No results for input(s): LIPASE, AMYLASE in the last 168 hours. No results for input(s): AMMONIA in the last 168 hours. CBC: Recent Labs  Lab 11/24/18 0804 11/25/18 0451 11/26/18 0323 11/27/18 0451 11/29/18 0550  WBC 7.6 7.8 8.6 7.2 7.5  HGB 7.9* 8.4* 8.2* 8.5* 8.6*  HCT 26.4* 27.2* 27.4* 28.0* 28.6*  MCV 79.3* 77.5* 78.7* 78.7* 79.4*  PLT 164 161 177 198 245   Cardiac Enzymes: No results for input(s): CKTOTAL, CKMB, CKMBINDEX, TROPONINI in the last 168 hours. BNP: BNP (last  3 results) Recent Labs    03/11/18 1526 04/09/18 1749  BNP 246.9* 297.8*    ProBNP (last 3 results) No results for input(s): PROBNP in the last 8760  hours.  CBG: Recent Labs  Lab 11/29/18 1651 11/29/18 2114 11/30/18 0553 11/30/18 1208 11/30/18 1610  GLUCAP 161* 217* 157* 148* 138*       Signed:  Domenic Polite MD.  [facility] 11/30/2018, 5:24 PM

## 2018-11-30 NOTE — Progress Notes (Signed)
Patient suffers from Rt ankle fx with NWB status who is unable to maintain standing or NWB status which impairs their ability to perform daily activities like walking, transfers to the toilet or chair in the home.  A walker alone will not resolve the issues with performing activities of daily living. A wheelchair will allow patient to safely perform daily activities.  The patient is working toward being able to  self propel in the home.  Elwyn Reach, PT Acute Rehabilitation Services Pager: [phone] Office: [phone]

## 2018-12-01 ENCOUNTER — Other Ambulatory Visit: Payer: Self-pay

## 2018-12-01 ENCOUNTER — Encounter (HOSPITAL_COMMUNITY): Payer: Self-pay

## 2018-12-01 ENCOUNTER — Inpatient Hospital Stay (HOSPITAL_COMMUNITY)
Admission: RE | Admit: 2018-12-01 | Discharge: 2018-12-14 | DRG: 559 | Disposition: A | Discharge status: Home Health | Payer: Medicare Other | Source: Intra-hospital | Attending: Physical Medicine & Rehabilitation | Admitting: Physical Medicine & Rehabilitation

## 2018-12-01 DIAGNOSIS — I2510 Atherosclerotic heart disease of native coronary artery without angina pectoris: Secondary | ICD-10-CM | POA: Diagnosis present

## 2018-12-01 DIAGNOSIS — R0602 Shortness of breath: Secondary | ICD-10-CM

## 2018-12-01 DIAGNOSIS — M79675 Pain in left toe(s): Secondary | ICD-10-CM | POA: Diagnosis present

## 2018-12-01 DIAGNOSIS — Z9119 Patient's noncompliance with other medical treatment and regimen: Secondary | ICD-10-CM

## 2018-12-01 DIAGNOSIS — Z981 Arthrodesis status: Secondary | ICD-10-CM

## 2018-12-01 DIAGNOSIS — J849 Interstitial pulmonary disease, unspecified: Secondary | ICD-10-CM | POA: Diagnosis present

## 2018-12-01 DIAGNOSIS — I130 Hypertensive heart and chronic kidney disease with heart failure and stage 1 through stage 4 chronic kidney disease, or unspecified chronic kidney disease: Secondary | ICD-10-CM | POA: Diagnosis present

## 2018-12-01 DIAGNOSIS — E1122 Type 2 diabetes mellitus with diabetic chronic kidney disease: Secondary | ICD-10-CM | POA: Diagnosis present

## 2018-12-01 DIAGNOSIS — N184 Chronic kidney disease, stage 4 (severe): Secondary | ICD-10-CM | POA: Diagnosis present

## 2018-12-01 DIAGNOSIS — T502X5A Adverse effect of carbonic-anhydrase inhibitors, benzothiadiazides and other diuretics, initial encounter: Secondary | ICD-10-CM | POA: Diagnosis not present

## 2018-12-01 DIAGNOSIS — R0989 Other specified symptoms and signs involving the circulatory and respiratory systems: Secondary | ICD-10-CM | POA: Diagnosis not present

## 2018-12-01 DIAGNOSIS — D509 Iron deficiency anemia, unspecified: Secondary | ICD-10-CM | POA: Diagnosis present

## 2018-12-01 DIAGNOSIS — Z833 Family history of diabetes mellitus: Secondary | ICD-10-CM

## 2018-12-01 DIAGNOSIS — R7309 Other abnormal glucose: Secondary | ICD-10-CM

## 2018-12-01 DIAGNOSIS — N179 Acute kidney failure, unspecified: Secondary | ICD-10-CM | POA: Diagnosis present

## 2018-12-01 DIAGNOSIS — Z1159 Encounter for screening for other viral diseases: Secondary | ICD-10-CM

## 2018-12-01 DIAGNOSIS — I2781 Cor pulmonale (chronic): Secondary | ICD-10-CM | POA: Diagnosis present

## 2018-12-01 DIAGNOSIS — J9622 Acute and chronic respiratory failure with hypercapnia: Secondary | ICD-10-CM | POA: Diagnosis present

## 2018-12-01 DIAGNOSIS — E1169 Type 2 diabetes mellitus with other specified complication: Secondary | ICD-10-CM

## 2018-12-01 DIAGNOSIS — I5043 Acute on chronic combined systolic (congestive) and diastolic (congestive) heart failure: Secondary | ICD-10-CM | POA: Diagnosis present

## 2018-12-01 DIAGNOSIS — I272 Pulmonary hypertension, unspecified: Secondary | ICD-10-CM | POA: Diagnosis not present

## 2018-12-01 DIAGNOSIS — I2729 Other secondary pulmonary hypertension: Secondary | ICD-10-CM | POA: Diagnosis present

## 2018-12-01 DIAGNOSIS — E119 Type 2 diabetes mellitus without complications: Secondary | ICD-10-CM

## 2018-12-01 DIAGNOSIS — I959 Hypotension, unspecified: Secondary | ICD-10-CM

## 2018-12-01 DIAGNOSIS — Z794 Long term (current) use of insulin: Secondary | ICD-10-CM

## 2018-12-01 DIAGNOSIS — Z9981 Dependence on supplemental oxygen: Secondary | ICD-10-CM

## 2018-12-01 DIAGNOSIS — Z7951 Long term (current) use of inhaled steroids: Secondary | ICD-10-CM

## 2018-12-01 DIAGNOSIS — S82851S Displaced trimalleolar fracture of right lower leg, sequela: Secondary | ICD-10-CM | POA: Diagnosis not present

## 2018-12-01 DIAGNOSIS — E118 Type 2 diabetes mellitus with unspecified complications: Secondary | ICD-10-CM

## 2018-12-01 DIAGNOSIS — N183 Chronic kidney disease, stage 3 unspecified: Secondary | ICD-10-CM

## 2018-12-01 DIAGNOSIS — R202 Paresthesia of skin: Secondary | ICD-10-CM | POA: Diagnosis present

## 2018-12-01 DIAGNOSIS — G8929 Other chronic pain: Secondary | ICD-10-CM

## 2018-12-01 DIAGNOSIS — I50810 Right heart failure, unspecified: Secondary | ICD-10-CM | POA: Diagnosis present

## 2018-12-01 DIAGNOSIS — I951 Orthostatic hypotension: Secondary | ICD-10-CM | POA: Diagnosis present

## 2018-12-01 DIAGNOSIS — J449 Chronic obstructive pulmonary disease, unspecified: Secondary | ICD-10-CM | POA: Diagnosis present

## 2018-12-01 DIAGNOSIS — J302 Other seasonal allergic rhinitis: Secondary | ICD-10-CM | POA: Diagnosis present

## 2018-12-01 DIAGNOSIS — J984 Other disorders of lung: Secondary | ICD-10-CM | POA: Diagnosis present

## 2018-12-01 DIAGNOSIS — Z9221 Personal history of antineoplastic chemotherapy: Secondary | ICD-10-CM

## 2018-12-01 DIAGNOSIS — Z79891 Long term (current) use of opiate analgesic: Secondary | ICD-10-CM

## 2018-12-01 DIAGNOSIS — I2722 Pulmonary hypertension due to left heart disease: Secondary | ICD-10-CM | POA: Diagnosis present

## 2018-12-01 DIAGNOSIS — I5023 Acute on chronic systolic (congestive) heart failure: Secondary | ICD-10-CM

## 2018-12-01 DIAGNOSIS — J9621 Acute and chronic respiratory failure with hypoxia: Secondary | ICD-10-CM | POA: Diagnosis present

## 2018-12-01 DIAGNOSIS — R5381 Other malaise: Secondary | ICD-10-CM | POA: Diagnosis present

## 2018-12-01 DIAGNOSIS — I2721 Secondary pulmonary arterial hypertension: Secondary | ICD-10-CM | POA: Diagnosis present

## 2018-12-01 DIAGNOSIS — Z8249 Family history of ischemic heart disease and other diseases of the circulatory system: Secondary | ICD-10-CM

## 2018-12-01 DIAGNOSIS — R0902 Hypoxemia: Secondary | ICD-10-CM

## 2018-12-01 DIAGNOSIS — F329 Major depressive disorder, single episode, unspecified: Secondary | ICD-10-CM | POA: Diagnosis present

## 2018-12-01 DIAGNOSIS — S82851D Displaced trimalleolar fracture of right lower leg, subsequent encounter for closed fracture with routine healing: Principal | ICD-10-CM

## 2018-12-01 DIAGNOSIS — E669 Obesity, unspecified: Secondary | ICD-10-CM | POA: Diagnosis present

## 2018-12-01 DIAGNOSIS — R0609 Other forms of dyspnea: Secondary | ICD-10-CM | POA: Diagnosis not present

## 2018-12-01 DIAGNOSIS — Z01818 Encounter for other preprocedural examination: Secondary | ICD-10-CM

## 2018-12-01 DIAGNOSIS — Z881 Allergy status to other antibiotic agents status: Secondary | ICD-10-CM

## 2018-12-01 DIAGNOSIS — D649 Anemia, unspecified: Secondary | ICD-10-CM

## 2018-12-01 DIAGNOSIS — Z87891 Personal history of nicotine dependence: Secondary | ICD-10-CM

## 2018-12-01 DIAGNOSIS — Z79899 Other long term (current) drug therapy: Secondary | ICD-10-CM

## 2018-12-01 DIAGNOSIS — I5042 Chronic combined systolic (congestive) and diastolic (congestive) heart failure: Secondary | ICD-10-CM | POA: Diagnosis present

## 2018-12-01 DIAGNOSIS — I9589 Other hypotension: Secondary | ICD-10-CM | POA: Diagnosis present

## 2018-12-01 DIAGNOSIS — Z85118 Personal history of other malignant neoplasm of bronchus and lung: Secondary | ICD-10-CM

## 2018-12-01 DIAGNOSIS — J441 Chronic obstructive pulmonary disease with (acute) exacerbation: Secondary | ICD-10-CM | POA: Diagnosis not present

## 2018-12-01 DIAGNOSIS — I5033 Acute on chronic diastolic (congestive) heart failure: Secondary | ICD-10-CM | POA: Diagnosis not present

## 2018-12-01 LAB — BASIC METABOLIC PANEL
Anion gap: 10 (ref 5–15)
BUN: 28 mg/dL — ABNORMAL HIGH (ref 8–23)
CO2: 29 mmol/L (ref 22–32)
Calcium: 9.4 mg/dL (ref 8.9–10.3)
Chloride: 99 mmol/L (ref 98–111)
Creatinine, Ser: 1.22 mg/dL — ABNORMAL HIGH (ref 0.44–1.00)
GFR calc Af Amer: 51 mL/min — ABNORMAL LOW (ref >60)
GFR calc non Af Amer: 44 mL/min — ABNORMAL LOW (ref >60)
Glucose, Bld: 146 mg/dL — ABNORMAL HIGH (ref 70–99)
Potassium: 4.1 mmol/L (ref 3.5–5.1)
Sodium: 138 mmol/L (ref 135–145)

## 2018-12-01 LAB — CBC
HCT: 26.9 % — ABNORMAL LOW (ref 36.0–46.0)
Hemoglobin: 8.1 g/dL — ABNORMAL LOW (ref 12.0–15.0)
MCH: 23.8 pg — ABNORMAL LOW (ref 26.0–34.0)
MCHC: 30.1 g/dL (ref 30.0–36.0)
MCV: 79.1 fL — ABNORMAL LOW (ref 80.0–100.0)
Platelets: 240 10*3/uL (ref 150–400)
RBC: 3.4 MIL/uL — ABNORMAL LOW (ref 3.87–5.11)
RDW: 19 % — ABNORMAL HIGH (ref 11.5–15.5)
WBC: 6.9 10*3/uL (ref 4.0–10.5)
nRBC: 0 % (ref 0.0–0.2)

## 2018-12-01 LAB — GLUCOSE, CAPILLARY
Glucose-Capillary: 180 mg/dL — ABNORMAL HIGH (ref 70–99)
Glucose-Capillary: 211 mg/dL — ABNORMAL HIGH (ref 70–99)
Glucose-Capillary: 199 mg/dL — ABNORMAL HIGH (ref 70–99)
Glucose-Capillary: 252 mg/dL — ABNORMAL HIGH (ref 70–99)

## 2018-12-01 LAB — MAGNESIUM: Magnesium: 2.1 mg/dL (ref 1.7–2.4)

## 2018-12-01 MED ORDER — METHOCARBAMOL 500 MG PO TABS
500.0000 mg | ORAL_TABLET | Freq: Four times a day (QID) | ORAL | Status: DC | PRN
  Administered 2018-12-03 – 2018-12-12 (×5): 500 mg via ORAL
  Filled 2018-12-01 (×5): qty 1

## 2018-12-01 MED ORDER — PROCHLORPERAZINE EDISYLATE 10 MG/2ML IJ SOLN: 5.0000 mg | Freq: Four times a day (QID) | INTRAMUSCULAR | Status: DC | PRN

## 2018-12-01 MED ORDER — POLYSACCHARIDE IRON COMPLEX 150 MG PO CAPS
150.0000 mg | ORAL_CAPSULE | Freq: Two times a day (BID) | ORAL | Status: DC
  Administered 2018-12-01 – 2018-12-14 (×26): 150 mg via ORAL
  Filled 2018-12-01 (×26): qty 1

## 2018-12-01 MED ORDER — ALUM & MAG HYDROXIDE-SIMETH 200-200-20 MG/5ML PO SUSP: 30.0000 mL | ORAL | Status: DC | PRN

## 2018-12-01 MED ORDER — POLYETHYLENE GLYCOL 3350 17 G PO PACK
17.0000 g | PACK | Freq: Every day | ORAL | Status: DC | PRN
  Administered 2018-12-02: 08:00:00 17 g via ORAL

## 2018-12-01 MED ORDER — ENOXAPARIN SODIUM 40 MG/0.4ML ~~LOC~~ SOLN
40.0000 mg | SUBCUTANEOUS | Status: DC
  Administered 2018-12-01 – 2018-12-13 (×13): 40 mg via SUBCUTANEOUS
  Filled 2018-12-01 (×13): qty 0.4

## 2018-12-01 MED ORDER — MACITENTAN 10 MG PO TABS
10.0000 mg | ORAL_TABLET | Freq: Every day | ORAL | Status: DC
  Administered 2018-12-02 – 2018-12-14 (×13): 10 mg via ORAL
  Filled 2018-12-01 (×13): qty 1

## 2018-12-01 MED ORDER — IPRATROPIUM-ALBUTEROL 0.5-2.5 (3) MG/3ML IN SOLN
3.0000 mL | RESPIRATORY_TRACT | Status: DC | PRN
  Administered 2018-12-03 – 2018-12-06 (×3): 3 mL via RESPIRATORY_TRACT
  Filled 2018-12-01 (×3): qty 3

## 2018-12-01 MED ORDER — DM-GUAIFENESIN ER 30-600 MG PO TB12
2.0000 | ORAL_TABLET | Freq: Two times a day (BID) | ORAL | Status: DC
  Administered 2018-12-01 – 2018-12-14 (×26): 2 via ORAL
  Filled 2018-12-01 (×13): qty 2
  Filled 2018-12-01: qty 1
  Filled 2018-12-01 (×3): qty 2
  Filled 2018-12-01: qty 1
  Filled 2018-12-01 (×9): qty 2

## 2018-12-01 MED ORDER — PROCHLORPERAZINE 25 MG RE SUPP: 12.5000 mg | Freq: Four times a day (QID) | RECTAL | Status: DC | PRN

## 2018-12-01 MED ORDER — MOMETASONE FURO-FORMOTEROL FUM 200-5 MCG/ACT IN AERO
2.0000 | INHALATION_SPRAY | Freq: Two times a day (BID) | RESPIRATORY_TRACT | Status: DC
  Administered 2018-12-01 – 2018-12-14 (×24): 2 via RESPIRATORY_TRACT
  Filled 2018-12-01: qty 8.8

## 2018-12-01 MED ORDER — POLYETHYLENE GLYCOL 3350 17 G PO PACK: 17.0000 g | PACK | Freq: Every day | ORAL | Status: DC | PRN

## 2018-12-01 MED ORDER — FLEET ENEMA 7-19 GM/118ML RE ENEM: 1.0000 | ENEMA | Freq: Once | RECTAL | Status: DC | PRN

## 2018-12-01 MED ORDER — INSULIN ASPART 100 UNIT/ML ~~LOC~~ SOLN
3.0000 [IU] | Freq: Three times a day (TID) | SUBCUTANEOUS | Status: DC
  Administered 2018-12-01 – 2018-12-14 (×37): 3 [IU] via SUBCUTANEOUS

## 2018-12-01 MED ORDER — PROCHLORPERAZINE MALEATE 5 MG PO TABS: 5.0000 mg | ORAL_TABLET | Freq: Four times a day (QID) | ORAL | Status: DC | PRN

## 2018-12-01 MED ORDER — MIDODRINE HCL 5 MG PO TABS
5.0000 mg | ORAL_TABLET | Freq: Three times a day (TID) | ORAL | Status: DC
  Administered 2018-12-01 – 2018-12-14 (×39): 5 mg via ORAL
  Filled 2018-12-01 (×39): qty 1

## 2018-12-01 MED ORDER — FUROSEMIDE 20 MG PO TABS
20.0000 mg | ORAL_TABLET | Freq: Two times a day (BID) | ORAL | Status: DC
  Administered 2018-12-01 – 2018-12-06 (×10): 20 mg via ORAL
  Filled 2018-12-01 (×10): qty 1

## 2018-12-01 MED ORDER — DOCUSATE SODIUM 100 MG PO CAPS
100.0000 mg | ORAL_CAPSULE | Freq: Two times a day (BID) | ORAL | Status: DC
  Administered 2018-12-01 – 2018-12-14 (×26): 100 mg via ORAL
  Filled 2018-12-01 (×26): qty 1

## 2018-12-01 MED ORDER — GABAPENTIN 300 MG PO CAPS
300.0000 mg | ORAL_CAPSULE | Freq: Every day | ORAL | Status: DC
  Administered 2018-12-01 – 2018-12-03 (×3): 300 mg via ORAL
  Filled 2018-12-01 (×3): qty 1

## 2018-12-01 MED ORDER — INSULIN ASPART 100 UNIT/ML ~~LOC~~ SOLN
0.0000 [IU] | Freq: Three times a day (TID) | SUBCUTANEOUS | Status: DC
  Administered 2018-12-02 – 2018-12-03 (×3): 2 [IU] via SUBCUTANEOUS
  Administered 2018-12-03 – 2018-12-04 (×2): 3 [IU] via SUBCUTANEOUS
  Administered 2018-12-04 (×2): 8 [IU] via SUBCUTANEOUS
  Administered 2018-12-04: 13:00:00 3 [IU] via SUBCUTANEOUS
  Administered 2018-12-05: 17:00:00 2 [IU] via SUBCUTANEOUS
  Administered 2018-12-05: 13:00:00 3 [IU] via SUBCUTANEOUS
  Administered 2018-12-05: 07:00:00 2 [IU] via SUBCUTANEOUS
  Administered 2018-12-06: 13:00:00 5 [IU] via SUBCUTANEOUS
  Administered 2018-12-06 – 2018-12-07 (×3): 3 [IU] via SUBCUTANEOUS
  Administered 2018-12-07: 18:00:00 2 [IU] via SUBCUTANEOUS
  Administered 2018-12-08 (×3): 3 [IU] via SUBCUTANEOUS
  Administered 2018-12-09: 08:00:00 5 [IU] via SUBCUTANEOUS
  Administered 2018-12-09 (×2): 2 [IU] via SUBCUTANEOUS
  Administered 2018-12-10 – 2018-12-11 (×4): 3 [IU] via SUBCUTANEOUS
  Administered 2018-12-11: 13:00:00 5 [IU] via SUBCUTANEOUS
  Administered 2018-12-11: 19:00:00 3 [IU] via SUBCUTANEOUS
  Administered 2018-12-12: 5 [IU] via SUBCUTANEOUS
  Administered 2018-12-12: 2 [IU] via SUBCUTANEOUS
  Administered 2018-12-12: 3 [IU] via SUBCUTANEOUS
  Administered 2018-12-13: 8 [IU] via SUBCUTANEOUS
  Administered 2018-12-13: 3 [IU] via SUBCUTANEOUS
  Administered 2018-12-14: 2 [IU] via SUBCUTANEOUS
  Administered 2018-12-14: 5 [IU] via SUBCUTANEOUS

## 2018-12-01 MED ORDER — ACETAMINOPHEN 325 MG PO TABS
325.0000 mg | ORAL_TABLET | ORAL | Status: DC | PRN
  Administered 2018-12-01 – 2018-12-13 (×18): 650 mg via ORAL
  Filled 2018-12-01 (×18): qty 2

## 2018-12-01 MED ORDER — INSULIN DETEMIR 100 UNIT/ML ~~LOC~~ SOLN
18.0000 [IU] | Freq: Every day | SUBCUTANEOUS | Status: DC
  Administered 2018-12-01 – 2018-12-05 (×5): 18 [IU] via SUBCUTANEOUS
  Filled 2018-12-01 (×7): qty 0.18

## 2018-12-01 MED ORDER — ATORVASTATIN CALCIUM 40 MG PO TABS
40.0000 mg | ORAL_TABLET | Freq: Every day | ORAL | Status: DC
  Administered 2018-12-02 – 2018-12-14 (×13): 40 mg via ORAL
  Filled 2018-12-01 (×13): qty 1

## 2018-12-01 MED ORDER — DIPHENHYDRAMINE HCL 12.5 MG/5ML PO ELIX: 12.5000 mg | ORAL_SOLUTION | Freq: Four times a day (QID) | ORAL | Status: DC | PRN

## 2018-12-01 MED ORDER — SELEXIPAG 200 MCG PO TABS
400.0000 ug | ORAL_TABLET | Freq: Two times a day (BID) | ORAL | Status: DC
  Administered 2018-12-01 – 2018-12-14 (×26): 400 ug via ORAL
  Filled 2018-12-01 (×29): qty 2

## 2018-12-01 MED ORDER — TRAZODONE HCL 50 MG PO TABS
25.0000 mg | ORAL_TABLET | Freq: Every evening | ORAL | Status: DC | PRN
  Filled 2018-12-01 (×2): qty 1

## 2018-12-01 MED ORDER — BISACODYL 10 MG RE SUPP: 10.0000 mg | Freq: Every day | RECTAL | Status: DC | PRN

## 2018-12-01 MED ORDER — VENLAFAXINE HCL ER 75 MG PO CP24
75.0000 mg | ORAL_CAPSULE | Freq: Every day | ORAL | Status: DC
  Administered 2018-12-02 – 2018-12-03 (×2): 75 mg via ORAL
  Filled 2018-12-01 (×2): qty 1

## 2018-12-01 MED ORDER — GUAIFENESIN-DM 100-10 MG/5ML PO SYRP: 5.0000 mL | ORAL_SOLUTION | Freq: Four times a day (QID) | ORAL | Status: DC | PRN

## 2018-12-01 NOTE — H&P (Signed)
Physical Medicine and Rehabilitation Admission H&P    Chief Complaint  Patient presents with  . Functional deficits  . Fall with ankle fracture and hypoxia     HPI:  Amy Shepard is a 73 year old female with history of CAD, CKD, COPD with chronic hypoxic/hypercapnic respiratory failure- 4 L oxygen dependent, lung cancer, C6NK, chronic systolic CHF with pulmonary HTN, gait disorder who sustained a fall due to weakness, twisted her right ankle and had sudden onset of severe pain.  History taken from chart review and patient.  She was found to have trimalleolar right ankle fracture and underwent ORIF of right ankle on 11/23/2018 by Dr. Sharol Given. To be nonweightbearing X 3 weeks with incisional VAC X one week.   Post op course complicated by hypotension and somnolence requiring pressors. PCCM consulted and patient treated with IV lasix, BIPAP and midodrine resumed as well as BDs as symptoms felt to be multifactorial due to anemia, fluid overload in setting of cor pulmonale, sleep related hypoventilation. She was found to have ABLA and was transfused 2 units PRBCs. Opsumit and Selexipeg resumed,  off BIPAP but has required HF oxygen now down to 6- 7L per [place] due to increased oxygen needs--pulmonary recommendations  keeping SpO2 90-92%. Therapy ongoing and patient continues to be limited by weakness, fatigue and NWB status affecting mobility and ADLs. CIR recommended due to functional decline.    Review of Systems  Constitutional: Negative for chills and fever.  HENT: Negative for hearing loss and tinnitus.   Eyes: Negative for blurred vision and double vision.  Respiratory: Positive for cough, shortness of breath and wheezing.   Cardiovascular: Negative for chest pain, palpitations and leg swelling.  Gastrointestinal: Negative for constipation and heartburn.  Genitourinary: Negative for frequency and urgency.  Musculoskeletal: Positive for joint pain and myalgias. Negative for back pain and  neck pain.  Skin: Negative for itching and rash.  Neurological: Positive for sensory change (numbness/tingling hands/feet) and weakness.  All other systems reviewed and are negative.    Past Medical History:  Diagnosis Date  . Arthritis   . CAD (coronary artery disease)    a. Prior cath 2015 showed 40% prox AD, 50-50% mLAD, otherwise calcification but no obstruction in LCx/RCA.. Medical management recommended. b. 2017: low-risk NST.  [name] Chronic diastolic CHF (congestive heart failure) ([place])   . Chronic respiratory failure ([place])   . CKD (chronic kidney disease), stage III ([place])   . COPD (chronic obstructive pulmonary disease) ([place])   . Cor pulmonale ([name])    a. felt due to advanced COPD and noncompliance with O2.  . Depression   . Diabetes mellitus    [name]    dx  2008  . Fracture    left foot  . Hx of seasonal allergies   . Hypercholesteremia   . Hypertension    on no medications  . Lung cancer ([place])   . On home oxygen therapy    "2.5L; 24/7" (03/10/2017)  . Pericardial effusion    a. small-moderate in 07/2016.  . Pulmonary hypertension ([place])   . UTI (urinary tract infection)     Past Surgical History:  Procedure Laterality Date  . APPLICATION OF WOUND VAC Right 11/23/2018   Procedure: Application Of Wound Vac;  Surgeon: Newt Minion, MD;  Location: [facility];  Service: Orthopedics;  Laterality: Right;  . [facility] in [place]  bilateral cataracts  . LEFT HEART CATHETERIZATION WITH CORONARY ANGIOGRAM N/A 07/12/2014   Procedure: LEFT HEART CATHETERIZATION WITH CORONARY ANGIOGRAM;  Surgeon: [name], MD;  Location: [facility] CATH LAB;  Service: Cardiovascular;  Laterality: N/A;  . MAXIMUM ACCESS (MAS)POSTERIOR LUMBAR INTERBODY FUSION (PLIF) 2 LEVEL N/A 02/22/2016   Procedure: Lumbar three-four - Lumbar four-five  MAXIMUM ACCESS SURGERY  POSTERIOR LUMBAR INTERBODY FUSION;  Surgeon: [name], MD;  Location: [facility]  NEURO ORS;  Service: Neurosurgery;  Laterality: N/A;  . ORIF ANKLE FRACTURE Right 11/23/2018   Procedure: OPEN REDUCTION INTERNAL FIXATION (ORIF) RIGHT ANKLE FRACTURE;  Surgeon: Newt Minion, MD;  Location: [facility];  Service: Orthopedics;  Laterality: Right;  . ORIF TOE FRACTURE Left 06/12/2017   Procedure: OPEN REDUCTION INTERNAL FIXATION (ORIF) BASE 1ST METATARSAL (TOE) FRACTURE;  Surgeon: Newt Minion, MD;  Location: [facility];  Service: Orthopedics;  Laterality: Left;  . RIGHT HEART CATH N/A 08/06/2017   Procedure: RIGHT HEART CATH;  Surgeon: Larey Dresser, MD;  Location: [facility] CV LAB;  Service: Cardiovascular;  Laterality: N/A;  . THORACOTOMY Right 2010   lower  . TONSILLECTOMY      Family History  Problem Relation Age of Onset  . Diabetes Father   . Heart failure Father   . Cancer Sister   . Diabetes Brother   . Heart failure Brother     Social History:   Lives alone. Retired Marine scientist. Independent with walker PTA. reports that she quit smoking about 12 years ago. She has a 30.00 pack-year smoking history. She has never used smokeless tobacco. She reports that she does not drink alcohol or use drugs.    Allergies  Allergen Reactions  . Amoxicillin Anaphylaxis, Hives, Rash and Other (See Comments)    Has patient had a PCN reaction causing immediate rash, facial/tongue/throat swelling, SOB or lightheadedness with hypotension: YES Positive reaction causing SEVERE RASH INVOLVING MUCUS MEMBRANES/SKIN NECROSIS: YES Reaction that required HOSPITALIZATION: YES Reaction occurring within the last 10 years: NO    Medications Prior to Admission  Medication Sig Dispense Refill  . atorvastatin (LIPITOR) 40 MG tablet Take 40 mg by mouth daily.     . furosemide (LASIX) 40 MG tablet Take 1 tablet (40 mg total) by mouth 2 (two) times daily. 60 tablet 3  . insulin lispro (HUMALOG) 100 UNIT/ML injection Inject 2-11 Units into the skin See admin instructions. Inject 6 units with each meal and  add additional units per sliding scale: BGL 101-150 = 2 units; 151-200 = 3 units; 201-250 = 5 units; 251-300 = 7 units; 301-350 = 9 units; >350 = 11 units; CALL MD IF BGL IS >400 OR <60    . macitentan (OPSUMIT) 10 MG tablet Take 1 tablet (10 mg total) by mouth daily. 30 tablet 11  . OXYGEN Inhale 3 L into the lungs continuous. FOR COPD    . venlafaxine XR (EFFEXOR-XR) 75 MG 24 hr capsule Take 75 mg by mouth daily with breakfast.     . [DISCONTINUED] gabapentin (NEURONTIN) 300 MG capsule Take 300 mg by mouth 3 (three) times daily.    . [DISCONTINUED] Insulin Detemir (LEVEMIR FLEXTOUCH) 100 UNIT/ML Pen Inject 36 Units into the skin at bedtime.     . [DISCONTINUED] Selexipag (UPTRAVI) 200 MCG TABS Take 3 tablets (600 mcg total) by mouth 2 (two) times daily. (Patient taking differently: Take 400 mcg by mouth 2 (two) times a day. ) 180 tablet 11  . acetaminophen (TYLENOL) 325 MG  tablet Take 650 mg by mouth every 6 (six) hours as needed for moderate pain or fever.     [name] atorvastatin (LIPITOR) 40 MG tablet Take 1 tablet (40 mg total) by mouth daily. 90 tablet 3  . Fluticasone-Salmeterol (ADVAIR) 250-50 MCG/DOSE AEPB Inhale 1 puff into the lungs 2 (two) times daily as needed (shortness of breath/wheezing).     [name] ipratropium-albuterol (DUONEB) 0.5-2.5 (3) MG/3ML SOLN Take 3 mLs by nebulization every 4 (four) hours as needed (wheezing, Shortness of breath). 360 mL 0  . midodrine (PROAMATINE) 5 MG tablet Take 1 tablet (5 mg total) by mouth 3 (three) times daily with meals. (Patient not taking: Reported on 11/22/2018) 30 tablet 3  . potassium chloride SA (K-DUR,KLOR-CON) 20 MEQ tablet Take 2 tablets (40 mEq total) by mouth daily. (Patient taking differently: Take 40 mEq by mouth daily as needed (low potassium). ) 60 tablet 3  . traMADol (ULTRAM) 50 MG tablet Take 1 tablet (50 mg total) by mouth every 8 (eight) hours as needed (for pain). 10 tablet 0  . TRULICITY 1.5 UJ/8.1XB SOPN Inject 1.5 mg into the vein once a  week.       Drug Regimen Review  Drug regimen was reviewed and remains appropriate with no significant issues identified  Home: Home Living Family/patient expects to be discharged to:: Private residence Living Arrangements: Alone Available Help at Discharge: Family, Available PRN/intermittently Type of Home: Apartment Home Access: Level entry Home Layout: One level Bathroom Shower/Tub: Chiropodist: Handicapped height Home Equipment: Environmental consultant - 4 wheels, Shower seat, Grab bars - tub/shower, Grab bars - toilet Additional Comments: Pt reports she has a w/c accessible apt    Functional History: Prior Function Level of Independence: Independent with assistive device(s) Comments: Drives; uses Rollator, independent with ADL's  Functional Status:  Mobility: Bed Mobility Overal bed mobility: Needs Assistance Bed Mobility: Supine to Sit Supine to sit: Supervision Sit to supine: Min guard General bed mobility comments: in recliner on arrival Transfers Overall transfer level: Needs assistance Equipment used: Rolling walker (2 wheeled) Transfers: Squat Pivot Transfers Sit to Stand: Min assist Stand pivot transfers: Min assist Squat pivot transfers: Min assist, Mod assist, +2 safety/equipment General transfer comment: pt in recliner on arrival with W/C placed to pt's left. Initial transfer to left pt with min +2 assist with RLE propped on P.T. foot to control NWB as pt continues to try to attempt pushing through right foot. Sequential cues with assist to reach for armrest and max assist for management of W/C parts. Return transfer to pt's right with RLE on P.T. foot and increased assist of mod assist to transition W/C to recline with sequential cues and decreased control of descent Ambulation/Gait General Gait Details: unable Wheelchair Mobility Wheelchair mobility: Yes Wheelchair propulsion: Both upper extremities, Left lower extremity Wheelchair parts: Needs  assistance Distance: 16'  Wheelchair Assistance Details (indicate cue type and reason): pt required max assist to manage bil legrest, max cues for brakes and physical assist to move W/C around corners. Pt fatigued after 50' of W/C ambulation and unable to progress further  ADL: ADL Overall ADL's : Needs assistance/impaired Eating/Feeding: Independent Grooming: Wash/dry hands, Wash/dry face, Oral care, Brushing hair, Set up, Sitting Upper Body Bathing: Set up, Sitting Lower Body Bathing: Moderate assistance, Sit to/from stand Upper Body Dressing : Set up, Sitting Lower Body Dressing: Maximal assistance, Sit to/from stand Toilet Transfer: Minimal assistance, Stand-pivot, BSC, RW Toileting- Clothing Manipulation and Hygiene: Maximal assistance, Sit to/from stand Functional  mobility during ADLs: Minimal assistance, Rolling walker General ADL Comments: Pt demonstrates difficutly maintaining NWB during functional tasks   Cognition: Cognition Overall Cognitive Status: Impaired/Different from baseline Orientation Level: Oriented X4 Cognition Arousal/Alertness: Awake/alert Behavior During Therapy: WFL for tasks assessed/performed Overall Cognitive Status: Impaired/Different from baseline Area of Impairment: Safety/judgement Safety/Judgement: Decreased awareness of deficits, Decreased awareness of safety General Comments: pt lacks insight into deficits and significant level of assist for mobility and inability to return home alone.    Blood pressure (!) 115/55, pulse 76, temperature 97.9 F (36.6 C), temperature source Oral, resp. rate 18, height _0  (1.549 m), weight 81.6 kg, SpO2 98 %. Physical Exam  Nursing note and vitals reviewed. Constitutional: She is oriented to person, place, and time. She appears well-developed.  Obesity  HENT:  Head: Normocephalic.  Facial abrasions  Eyes: EOM are normal. Right eye exhibits no discharge. Left eye exhibits no discharge.  Respiratory: Effort  normal. No respiratory distress.  Upper airway congested sounds. Expiratory wheezes  in BLL + [place]  GI: She exhibits no distension.  Musculoskeletal:     Comments: Wound VAC/CAM boot in place on right ankle . RLE with edema and tenderness  Neurological: She is alert and oriented to person, place, and time.  Motor: Bilateral upper extremities, left lower extremity: 5/5 proximal distally left lower extremity: Hip flexion 5/5, knee extension 4+/5, ankle braced  Skin: Skin is warm and dry.  Psychiatric: She has a normal mood and affect. Her behavior is normal.    Results for orders placed or performed during the hospital encounter of 11/22/18 (from the past 48 hour(s))  Glucose, capillary     Status: Abnormal   Collection Time: 11/29/18 12:08 PM  Result Value Ref Range   Glucose-Capillary 174 (H) 70 - 99 mg/dL  Glucose, capillary     Status: Abnormal   Collection Time: 11/29/18  4:51 PM  Result Value Ref Range   Glucose-Capillary 161 (H) 70 - 99 mg/dL  Glucose, capillary     Status: Abnormal   Collection Time: 11/29/18  9:14 PM  Result Value Ref Range   Glucose-Capillary 217 (H) 70 - 99 mg/dL  Glucose, capillary     Status: Abnormal   Collection Time: 11/30/18  5:53 AM  Result Value Ref Range   Glucose-Capillary 157 (H) 70 - 99 mg/dL  Glucose, capillary     Status: Abnormal   Collection Time: 11/30/18 12:08 PM  Result Value Ref Range   Glucose-Capillary 148 (H) 70 - 99 mg/dL  Glucose, capillary     Status: Abnormal   Collection Time: 11/30/18  4:10 PM  Result Value Ref Range   Glucose-Capillary 138 (H) 70 - 99 mg/dL  Glucose, capillary     Status: Abnormal   Collection Time: 11/30/18  8:49 PM  Result Value Ref Range   Glucose-Capillary 214 (H) 70 - 99 mg/dL  Glucose, capillary     Status: Abnormal   Collection Time: 12/01/18  5:46 AM  Result Value Ref Range   Glucose-Capillary 180 (H) 70 - 99 mg/dL  CBC     Status: Abnormal   Collection Time: 12/01/18  7:34 AM  Result Value  Ref Range   WBC 6.9 4.0 - 10.5 K/uL   RBC 3.40 (L) 3.87 - 5.11 MIL/uL   Hemoglobin 8.1 (L) 12.0 - 15.0 g/dL   HCT 26.9 (L) 36.0 - 46.0 %   MCV 79.1 (L) 80.0 - 100.0 fL   MCH 23.8 (L) 26.0 - 34.0 pg  MCHC 30.1 30.0 - 36.0 g/dL   RDW 19.0 (H) 11.5 - 15.5 %   Platelets 240 150 - 400 K/uL   nRBC 0.0 0.0 - 0.2 %    Comment: Performed at [facility], [place] [address], [place], [place] [address]  Basic metabolic panel     Status: Abnormal   Collection Time: 12/01/18  7:34 AM  Result Value Ref Range   Sodium 138 135 - 145 mmol/L   Potassium 4.1 3.5 - 5.1 mmol/L   Chloride 99 98 - 111 mmol/L   CO2 29 22 - 32 mmol/L   Glucose, Bld 146 (H) 70 - 99 mg/dL   BUN 28 (H) 8 - 23 mg/dL   Creatinine, Ser 1.22 (H) 0.44 - 1.00 mg/dL   Calcium 9.4 8.9 - 10.3 mg/dL   GFR calc non Af Amer 44 (L) >60 mL/min   GFR calc Af Amer 51 (L) >60 mL/min   Anion gap 10 5 - 15    Comment: Performed at [facility] [address], [place], [place] [address]  Magnesium     Status: None   Collection Time: 12/01/18  7:34 AM  Result Value Ref Range   Magnesium 2.1 1.7 - 2.4 mg/dL    Comment: Performed at [facility] [address] [place], [place] [address]   No results found.     Medical Problem List and Plan: 1.  Functional deficits secondary to right trimalleolar ankle fracture and acute on chronic respiratory failure.    Admit to CIR 2.  Antithrombotics: -DVT/anticoagulation:  Pharmaceutical: Lovenox  -antiplatelet therapy: N/A. 3. Pain Management:  Controlled with prn tylenol and robaxin.  Monitor with increased mobility 4. Mood: LCSW to follow for evaluation and support.   -antipsychotic agents: N/A 5. Neuropsych: This patient is capable of making decisions on her own behalf. 6. Skin/Wound Care: Routine pressure relief measures. Will plan to remove wound VAC as 7+ days post op. 7. Fluids/Electrolytes/Nutrition: Monitor I/O.  BMP ordered for tomorrow a.m. 8. H/o Lung cancer/COPD-  O2 dependent: Currently on 6L HF oxygen--> weaned to 4L HF this afternoon.  Continue to wean supplemental oxygen as tolerated.  Continue Dulera bid with duonebs prn.  9. Chronic systolic CHF with pulmonary HTN: Monitor weights.  Continue Opsumit and Selexipag. On Lipitor. Currently stable on lower dose lasix.  10. T2DM;  Continue Levemir and titrate as needed. Off Trulicity at this time. Will add meal coverage as at home and use SSI for elevated BS. Continue to monitor BS ac/hs.  Monitor with increased mobility. 11. CKD:  Continue to monitor renal status with serial checks. Has improved with SCr-1.22.  BMP ordered for tomorrow a.m. 12. Chronic hypotension: Monitor BP tid. On midodrine with BP improving.  Monitor with increased mobility. 13. Acute on chronic iron deficiency anemia: Baseline Hgb in 9 range. Has improved past transfusion. Add iron supplement. Will continue to monitor. CBC ordered for tomorrow.  14. Right trimalleolar ankle fracture: NWB X 3 weeks. CAM at all times.   Bary Leriche, PA-C 12/01/2018

## 2018-12-01 NOTE — Progress Notes (Signed)
Inpatient Rehab Admissions Coordinator:   I have insurance authorization and approval from Dr. Florene Glen for admission to [facility] today.  I will update CSW, RN, and pt/family.    Shann Medal, PT, DPT Admissions Coordinator [phone] 12/01/18  10:44 AM

## 2018-12-01 NOTE — PMR Pre-admission (Signed)
PMR Admission Coordinator Pre-Admission Assessment  Patient: Amy Shepard is an 73 y.o., female MRN: [id number] DOB: [birth year] Height: _0  (154.9 cm) Weight: 81.6 kg(A scale)  Insurance Information HMO:     PPO: yes     PCP:      IPA:      80/20:      OTHER:  PRIMARY: U[employer] Medicare      Policy#: [id number]      Subscriber: patient CM Name: Adonis Huguenin      Phone#:      Fax#:  Pre-Cert#: [id number] [place] provided for CIR admission by Adonis Huguenin with [facility] Medicare for admission on 5/20 with updates due to [place] on 5/22 (phone [phone], fax [phone])      Employer:  Benefits:  Phone #: [phone]     Name:  Eff. Date: 07/14/2018     Deduct: $0      Out of Pocket Max: $3900 (met $1019.47)      Life Max: n/a CIR: $345/day for days 1-5      SNF: 20 full days Outpatient: 80%     Co-Pay: 20% Home Health: 100%      Co-Pay:  DME: 80%     Co-Pay: 20% Providers: in network  SECONDARY:       Policy#:       Subscriber:  CM Name:       Phone#:      Fax#:  Pre-Cert#:       Employer:  Benefits:  Phone #:      Name:  Eff. Date:      Deduct:       Out of Pocket Max:       Life Max:  CIR:       SNF:  Outpatient:      Co-Pay:  Home Health:       Co-Pay:  DME:      Co-Pay:   Medicaid Application Date:       Case Manager:  Disability Application Date:       Case Worker:   The "Data Collection Information Summary" for patients in Inpatient Rehabilitation Facilities with attached "Privacy Act [place] Records" was provided and verbally reviewed with: Patient  Emergency Contact Information Contact Information    Name Relation Home Work Mobile   [place] Daughter   [phone]      Current Medical History  Patient Admitting Diagnosis: R trimalleolar fracture, respiratory failure   History of Present Illness: Pt is a 73 y/o female with PMH of CAD, diastolic heart failure, CKD stage III, COPD on 4L home O2, type 2 DM, HTN, and chronic respiratory failure.  She was admitted on  11/22/2018 following a fall and found to have R ankle fracture dislocation.  Orthopedics was consulted and recommended ORIF which took place on 11/23/2018.  She is NWB on RLE.  Postop course complicated by respiratory failure and hypotension.  She required a bipap for respiratory failure, with difficulty weaning, and was weaned to 15L high flow nasal cannula on 5/15, currently on 6-7L via nasal cannula.  Hypotension felt due to anemia after surgery and volume overload in the setting of cor pulmonale.  Recommendations for gentle hydration and midodrine for management.  Therapy evaluations completed and recommendations were made for CIR given patient's decline in functional status and complicated respiratory status.     Patient's medical record from [facility] has been reviewed by the rehabilitation admission coordinator and physician.  Past Medical History  Past Medical History:  Diagnosis Date  . Arthritis   . CAD (coronary artery disease)    a. Prior cath 2015 showed 40% prox AD, 50-50% mLAD, otherwise calcification but no obstruction in LCx/RCA.. Medical management recommended. b. 2017: low-risk NST.  [name] Chronic diastolic CHF (congestive heart failure) ([place])   . Chronic respiratory failure ([place])   . CKD (chronic kidney disease), stage III ([place])   . COPD (chronic obstructive pulmonary disease) ([place])   . Cor pulmonale ([name])    a. felt due to advanced COPD and noncompliance with O2.  . Depression   . Diabetes mellitus    [name]    dx  2008  . Fracture    left foot  . Hx of seasonal allergies   . Hypercholesteremia   . Hypertension    on no medications  . Lung cancer ([place])   . On home oxygen therapy    "2.5L; 24/7" (03/10/2017)  . Pericardial effusion    a. small-moderate in 07/2016.  . Pulmonary hypertension ([place])   . UTI (urinary tract infection)     Family History   family history includes Cancer in her sister; Diabetes in her brother and father; Heart failure in her brother  and father.  Prior Rehab/Hospitalizations Has the patient had prior rehab or hospitalizations prior to admission? Yes  Has the patient had major surgery during 100 days prior to admission? Yes   Current Medications  Current Facility-Administered Medications:  .  0.9 %  sodium chloride infusion, , Intravenous, Continuous, Newt Minion, MD, Stopped at 11/24/18 2122 .  [DISCONTINUED] acetaminophen (TYLENOL) tablet 650 mg, 650 mg, Oral, Q6H PRN **OR** acetaminophen (TYLENOL) suppository 650 mg, 650 mg, Rectal, Q6H PRN, Newt Minion, MD, 650 mg at 11/25/18 0325 .  acetaminophen (TYLENOL) tablet 650 mg, 650 mg, Oral, Q6H PRN, Chesley Mires, MD, 650 mg at 11/30/18 1219 .  atorvastatin (LIPITOR) tablet 40 mg, 40 mg, Oral, Daily, Newt Minion, MD, 40 mg at 12/01/18 0902 .  bisacodyl (DULCOLAX) suppository 10 mg, 10 mg, Rectal, Daily PRN, Newt Minion, MD .  chlorhexidine (PERIDEX) 0.12 % solution 15 mL, 15 mL, Mouth Rinse, BID, Domenic Polite, MD, 15 mL at 11/30/18 1011 .  dextromethorphan-guaiFENesin (MUCINEX DM) 30-600 MG per 12 hr tablet 2 tablet, 2 tablet, Oral, BID, Domenic Polite, MD, 2 tablet at 12/01/18 0901 .  docusate sodium (COLACE) capsule 100 mg, 100 mg, Oral, BID, Newt Minion, MD, 100 mg at 12/01/18 0904 .  furosemide (LASIX) tablet 20 mg, 20 mg, Oral, BID, Domenic Polite, MD, 20 mg at 12/01/18 0902 .  gabapentin (NEURONTIN) capsule 300 mg, 300 mg, Oral, QHS, Domenic Polite, MD, 300 mg at 11/30/18 2125 .  gabapentin (NEURONTIN) capsule 300 mg, 300 mg, Oral, TID, Rigoberto Noel, MD, 300 mg at 12/01/18 0902 .  heparin injection 5,000 Units, 5,000 Units, Subcutaneous, Q8H, Domenic Polite, MD, 5,000 Units at 12/01/18 [phone] .  insulin aspart (novoLOG) injection 0-9 Units, 0-9 Units, Subcutaneous, TID WC, Newt Minion, MD, 2 Units at 12/01/18 [phone] .  insulin detemir (LEVEMIR) injection 18 Units, 18 Units, Subcutaneous, QHS, Erick Colace, NP, 18 Units at 11/30/18 2125 .   ipratropium-albuterol (DUONEB) 0.5-2.5 (3) MG/3ML nebulizer solution 3 mL, 3 mL, Nebulization, Q4H PRN, Newt Minion, MD .  macitentan (OPSUMIT) tablet 10 mg, 10 mg, Oral, Daily, Halford Chessman, Vineet, MD, 10 mg at 11/30/18 1011 .  magnesium citrate solution 1 Bottle, 1 Bottle, Oral, Once PRN, Newt Minion, MD .  methocarbamol (ROBAXIN) tablet 500 mg, 500 mg, Oral, Q6H PRN **OR** methocarbamol (ROBAXIN) 500 mg in dextrose 5 % 50 mL IVPB, 500 mg, Intravenous, Q6H PRN, Newt Minion, MD .  midodrine (PROAMATINE) tablet 5 mg, 5 mg, Oral, TID WC, Domenic Polite, MD, 5 mg at 12/01/18 0904 .  mometasone-formoterol (DULERA) 200-5 MCG/ACT inhaler 2 puff, 2 puff, Inhalation, BID, Newt Minion, MD, 2 puff at 12/01/18 [phone] .  morphine 2 MG/ML injection 1 mg, 1 mg, Intravenous, Q2H PRN, Newt Minion, MD, 1 mg at 11/22/18 2148 .  ondansetron (ZOFRAN) tablet 4 mg, 4 mg, Oral, Q6H PRN **OR** ondansetron (ZOFRAN) injection 4 mg, 4 mg, Intravenous, Q6H PRN, Newt Minion, MD .  polyethylene glycol (MIRALAX / GLYCOLAX) packet 17 g, 17 g, Oral, Daily PRN, Newt Minion, MD .  Selexipag TABS 400 mcg, 400 mcg, Oral, BID, Chesley Mires, MD, 400 mcg at 11/30/18 2135 .  venlafaxine XR (EFFEXOR-XR) 24 hr capsule 75 mg, 75 mg, Oral, Q breakfast, Newt Minion, MD, 75 mg at 12/01/18 0902  Facility-Administered Medications Ordered in Other Encounters:  .  diatrizoate meglumine-sodium (GASTROGRAFIN) 66-10 % solution 30 mL, 30 mL, Oral, PRN, Bjorn Loser, MD, 30 mL at 11/30/15 0820  Patients Current Diet:  Diet Order            Diet - low sodium heart healthy        Diet heart healthy/carb modified Room service appropriate? Yes with Assist; Fluid consistency: Thin  Diet effective now              Precautions / Restrictions Precautions Precautions: Fall Other Brace: R CAM boot Restrictions Weight Bearing Restrictions: Yes RLE Weight Bearing: Non weight bearing   Has the patient had 2 or more falls or a  fall with injury in the past year? Yes  Prior Activity Level Limited Community (1-2x/wk): was driving, getting out of the apartment 1-2x/week for errands  Prior Functional Level Self Care: Did the patient need help bathing, dressing, using the toilet or eating? Independent  Indoor Mobility: Did the patient need assistance with walking from room to room (with or without device)? Independent  Stairs: Did the patient need assistance with internal or external stairs (with or without device)? Independent  Functional Cognition: Did the patient need help planning regular tasks such as shopping or remembering to take medications? [place] / Equipment Home Assistive Devices/Equipment: Environmental consultant (specify type)(rolling) Home Equipment: Walker - 4 wheels, Shower seat, Grab bars - tub/shower, Grab bars - toilet  Prior Device Use: Indicate devices/aids used by the patient prior to current illness, exacerbation or injury? rollator  Current Functional Level Cognition  Overall Cognitive Status: Impaired/Different from baseline Orientation Level: Oriented X4 Safety/Judgement: Decreased awareness of deficits, Decreased awareness of safety General Comments: pt lacks insight into deficits and significant level of assist for mobility and inability to return home alone.     Extremity Assessment (includes Sensation/Coordination)  Upper Extremity Assessment: Overall WFL for tasks assessed  Lower Extremity Assessment: Defer to PT evaluation RLE Deficits / Details: s/p ORIF. At least anti gravity strength    ADLs  Overall ADL's : Needs assistance/impaired Eating/Feeding: Independent Grooming: Wash/dry hands, Wash/dry face, Oral care, Brushing hair, Set up, Sitting Upper Body Bathing: Set up, Sitting Lower Body Bathing: Moderate assistance, Sit to/from stand Upper Body Dressing : Set up, Sitting Lower Body Dressing: Maximal assistance, Sit to/from stand Toilet Transfer: Minimal  assistance, Stand-pivot, BSC, RW Toileting- Clothing Manipulation  and Hygiene: Maximal assistance, Sit to/from stand Functional mobility during ADLs: Minimal assistance, Rolling walker General ADL Comments: Pt demonstrates difficutly maintaining NWB during functional tasks     Mobility  Overal bed mobility: Needs Assistance Bed Mobility: Supine to Sit Supine to sit: Supervision Sit to supine: Min guard General bed mobility comments: in recliner on arrival    Transfers  Overall transfer level: Needs assistance Equipment used: Rolling walker (2 wheeled) Transfers: Squat Pivot Transfers Sit to Stand: Min assist Stand pivot transfers: Min assist Squat pivot transfers: Min assist, Mod assist, +2 safety/equipment General transfer comment: pt in recliner on arrival with W/C placed to pt's left. Initial transfer to left pt with min +2 assist with RLE propped on P.T. foot to control NWB as pt continues to try to attempt pushing through right foot. Sequential cues with assist to reach for armrest and max assist for management of W/C parts. Return transfer to pt's right with RLE on P.T. foot and increased assist of mod assist to transition W/C to recline with sequential cues and decreased control of descent    Ambulation / Gait / Stairs / Wheelchair Mobility  Ambulation/Gait General Gait Details: unable Product manager mobility: Yes Wheelchair propulsion: Both upper extremities, Left lower extremity Wheelchair parts: Needs assistance Distance: 55'  Wheelchair Assistance Details (indicate cue type and reason): pt required max assist to manage bil legrest, max cues for brakes and physical assist to move W/C around corners. Pt fatigued after 50' of W/C ambulation and unable to progress further    Posture / Balance Balance Overall balance assessment: Needs assistance Sitting-balance support: Feet supported Sitting balance-Leahy Scale: Good Standing balance support: Bilateral upper  extremity supported Standing balance-Leahy Scale: Poor Standing balance comment: reliant on UE support unable to maintain RLE NWB    Special needs/care consideration BiPAP/CPAP no CPM no Continuous Drip IV no Dialysis no        Days n/a Life Vest no Oxygen yes, 6L via nasal cannula (4L baseline, per Dr. Florene Glen, continue to attempt to wean to baseline) Special Bed no Trach Size no Wound Vac (area) yes      Location R ankle Skin abrasion to hand/nose, excoriation to buttocks, incision to R ankle                              Bowel mgmt: continent, last BM 11/30/2018 Bladder mgmt: continent Diabetic mgmt: yes, insulin Behavioral consideration no Chemo/radiation no   Previous Home Environment (from acute therapy documentation) Living Arrangements: Alone Available Help at Discharge: Family, Available PRN/intermittently Type of Home: Apartment Home Layout: One level Home Access: Level entry Bathroom Shower/Tub: Chiropodist: Handicapped height Home Care Services: Yes [place] (if known): [facility] Additional Comments: Pt reports she has a w/c accessible apt   Discharge Living Setting Plans for Discharge Living Setting: Patient's home Type of Home at Discharge: Apartment Discharge Home Layout: One level Discharge Home Access: Level entry Discharge Bathroom Shower/Tub: Tub/shower unit Discharge Bathroom Toilet: Standard Discharge Bathroom Accessibility: Yes How Accessible: Accessible via wheelchair, Accessible via walker Does the patient have any problems obtaining your medications?: No  Social/Family/Support Systems Anticipated Caregiver: pt's daughter, Rayna Anticipated Caregiver's Contact Information: [phone] Ability/Limitations of Caregiver: Rayna works FT and is a single mom to 2 children, she will only be able to provide intermittent supervision at best.  Pt will need to be mod I from w/c level for d/c  home to accessible apartment.   Caregiver Availability: Intermittent Discharge Plan Discussed with Primary Caregiver: Yes Is Caregiver In Agreement with Plan?: Yes Does Caregiver/Family have Issues with Lodging/Transportation while Pt is in Rehab?: No  Goals/Additional Needs Patient/Family Goal for Rehab: PT/OT mod I w/c level Expected length of stay: 10-14 days Dietary Needs: heart health/carb modified, thin Equipment Needs: tbd Additional Information: wound vac to R ankle Pt/Family Agrees to Admission and willing to participate: Yes Program Orientation Provided & Reviewed with Pt/Caregiver Including Roles  & Responsibilities: Yes   Possible need for SNF placement upon discharge: not anticipated  Patient Condition: I have reviewed medical records from [facility], spoken with CSW, and patient and daughter. I met with patient at the bedside and discussed with daughter via phone for inpatient rehabilitation assessment.  Patient will benefit from ongoing PT and OT, can actively participate in 3 hours of therapy a day 5 days of the week, and can make measurable gains during the admission.  Patient will also benefit from the coordinated team approach during an Inpatient Acute Rehabilitation admission.  The patient will receive intensive therapy as well as Rehabilitation physician, nursing, social worker, and care management interventions.  Due to safety, skin/wound care, disease management, medication administration, pain management and patient education the patient requires 24 hour a day rehabilitation nursing.  The patient is currently min +2 with mobility and basic ADLs.  Discharge setting and therapy post discharge at home with home health is anticipated.  Patient has agreed to participate in the Acute Inpatient Rehabilitation Program and will admit today.  Preadmission Screen Completed By:  Michel Santee, PT, DPT 12/01/2018 10:28 AM ______________________________________________________________________   Discussed  status with Dr. Posey Pronto on 12/01/18  at 10:44 AM  and received approval for admission today.  Admission Coordinator:  Michel Santee, PT, DPT 10:44 AM Sudie Grumbling 12/01/18    Assessment/Plan: Diagnosis: R trimalleolar fracture, respiratory failure   1. Does the need for close, 24 hr/day Medical supervision in concert with the patient's rehab needs make it unreasonable for this patient to be served in a less intensive setting? Potentially  2. Co-Morbidities requiring supervision/potential complications: CAD, diastolic heart failure, CKD stage III, COPD, type 2 DM, HTN 3. Due to safety, skin/wound care, disease management, pain management and patient education, does the patient require 24 hr/day rehab nursing? Potentially 4. Does the patient require coordinated care of a physician, rehab nurse, PT (1-2 hrs/day, 5 days/week) and OT (1-2 hrs/day, 5 days/week) to address physical and functional deficits in the context of the above medical diagnosis(es)? Potentially Addressing deficits in the following areas: balance, endurance, locomotion, strength, transferring, bathing, dressing, toileting and psychosocial support 5. Can the patient actively participate in an intensive therapy program of at least 3 hrs of therapy 5 days a week? Yes 6. The potential for patient to make measurable gains while on inpatient rehab is excellent 7. Anticipated functional outcomes upon discharge from inpatients are: Mod I at wheelchair level PT, Mod I at wheelchair level OT, n/a SLP 8. Estimated rehab length of stay to reach the above functional goals is: 10-14 days. 9. Anticipated D/C setting: Home 10. Anticipated post D/C treatments: HH therapy and Home excercise program 11. Overall Rehab/Functional Prognosis: excellent and good  MD Signature: Delice Lesch, MD, ABPMR

## 2018-12-01 NOTE — Progress Notes (Signed)
Physical Therapy Treatment Patient Details Name: Amy Shepard MRN: [id number] DOB: 10/02/1945 Today's Date: 12/01/2018    History of Present Illness Pt is a 73 y.o. F with significant PMH of COPD/chronic respiratory failure, severe pulmonary hypertension, chronic diastolic CHF, diabetes, stage III CKD, CAD who presents following a fall and a right trimalleolar ankle fracture. Now s/p ORIF 5/13     PT Comments    Pt agreeable to wheelchair training with therapy. Pt is currently minA for transfers, and wheelchair management today, however continues to require increased cuing for maintaining weightbearing. Pt looking forward to discharge to CIR this afternoon.    Follow Up Recommendations  CIR;Supervision for mobility/OOB     Equipment Recommendations  Wheelchair (measurements PT);3in1 (PT)    Recommendations for Other Services OT consult     Precautions / Restrictions Precautions Precautions: Fall Required Braces or Orthoses: Other Brace Other Brace: R CAM boot Restrictions Weight Bearing Restrictions: Yes RLE Weight Bearing: Non weight bearing    Mobility  Bed Mobility               General bed mobility comments: OOB in recliner  Transfers Overall transfer level: Needs assistance Equipment used: Rolling walker (2 wheeled) Transfers: Sit to/from [facility] Sit to Stand: Min assist;+2 safety/equipment Stand pivot transfers: Min assist       General transfer comment: minA for sit to stand to RW, vc for hand placement for power up and assist to maintain NWB, minA for steadying with pivoting from w/c to recliner  Ambulation/Gait Ambulation/Gait assistance: Min assist;+2 safety/equipment Gait Distance (Feet): 2 Feet Assistive device: Rolling walker (2 wheeled) Gait Pattern/deviations: Step-to pattern;Decreased step length - left;Trunk flexed Gait velocity: slowed Gait velocity interpretation: <1.31 ft/sec, indicative of household  ambulator General Gait Details: minA for steadying with hopping from recliner to w/c, vc for upright poture, increased UE support and maintaining NWB, increased pain in L foot due to prior toe fxs   Theme park manager mobility: Yes Wheelchair propulsion: Both upper extremities;Left lower extremity Wheelchair parts: Needs assistance Distance: 20 Wheelchair Assistance Details (indicate cue type and reason): max A for placement of leg rest, vc for brake use and for turning  Modified Rankin (Stroke Patients Only)       Balance   Sitting-balance support: Feet supported Sitting balance-Leahy Scale: Good     Standing balance support: Bilateral upper extremity supported Standing balance-Leahy Scale: Poor                              Cognition Arousal/Alertness: Awake/alert Behavior During Therapy: WFL for tasks assessed/performed Overall Cognitive Status: Within Functional Limits for tasks assessed                                           General Comments General comments (skin integrity, edema, etc.): Pt on 6L O2 via [place], 3/4 DoE with transfers and drop in O2 to 85%O2 cues for pursed lipped breathing and SaO2 returned ot 90%O2      Pertinent Vitals/Pain Pain Assessment: Faces Faces Pain Scale: Hurts even more Pain Location: sore right ankle and left great toe Pain Descriptors / Indicators: Sore;Aching Pain Intervention(s): Limited activity within patient's tolerance;Monitored during session;Repositioned  PT Goals (current goals can now be found in the care plan section) Acute Rehab PT Goals PT Goal Formulation: With patient Time For Goal Achievement: 12/09/18 Potential to Achieve Goals: Good Progress towards PT goals: Progressing toward goals    Frequency    Min 5X/week      PT Plan Current plan remains appropriate    Co-evaluation PT/OT/SLP  Co-Evaluation/Treatment: Yes Reason for Co-Treatment: For patient/therapist safety PT goals addressed during session: Mobility/safety with mobility;Balance OT goals addressed during session: ADL's and self-care;Proper use of Adaptive equipment and DME;Strengthening/ROM      AM-PAC PT "6 Clicks" Mobility   Outcome Measure  Help needed turning from your back to your side while in a flat bed without using bedrails?: None Help needed moving from lying on your back to sitting on the side of a flat bed without using bedrails?: A Little Help needed moving to and from a bed to a chair (including a wheelchair)?: A Little Help needed standing up from a chair using your arms (e.g., wheelchair or bedside chair)?: A Little Help needed to walk in hospital room?: Total Help needed climbing 3-5 steps with a railing? : Total 6 Click Score: 15    End of Session Equipment Utilized During Treatment: Gait belt;Oxygen Activity Tolerance: Patient tolerated treatment well Patient left: with call bell/phone within reach;in chair;with chair alarm set   PT Visit Diagnosis: Unsteadiness on feet (R26.81);Other abnormalities of gait and mobility (R26.89);History of falling (Z91.81)     Time: 3736-6815 PT Time Calculation (min) (ACUTE ONLY): 44 min  Charges:  $Wheel Chair Management: 8-22 mins                     Yerachmiel Spinney B. Migdalia Dk PT, DPT Acute Rehabilitation Services Pager [phone] Office [phone]    [place] 12/01/2018, 1:03 PM

## 2018-12-01 NOTE — Progress Notes (Deleted)
Pt seen and evaluated today.   Pt d/c to CIR awaiting bed.  See d/c summary from 5/19. Pt currently on 7 L [place], discussed with nurse, will plan to wean as tolerated.  Continue to follow closely in CIR.   Otherwise, per d/c summary 5/20. Labs stable Pt NAD, RRR, unlabored breathing without additional breath sounds.  R boot in place.  Wound vac.

## 2018-12-01 NOTE — Progress Notes (Signed)
Jamse Arn, MD  Physician  Physical Medicine and Rehabilitation  PMR Pre-admission  Signed  Date of Service:  12/01/2018 10:28 AM       Related encounter: ED to Hosp-Admission (Discharged) from 11/22/2018 in [place] [facility]      Signed         Show:Clear all _0 Manual_1 Template_2 Copied  Added by: _3 Jamse Arn, MD_4 Michel Santee, PT  _5 Hover for details PMR Admission Coordinator Pre-Admission Assessment  Patient: Amy Shepard is an 73 y.o., female MRN: [id number] DOB: [birth year] Height: _6  (154.9 cm) Weight: 81.6 kg(A scale)  Insurance Information HMO:     PPO: yes     PCP:      IPA:      80/20:      OTHER:  PRIMARY: [employer] Medicare      Policy#: [id number]      Subscriber: patient CM Name: Adonis Huguenin      Phone#:      Fax#:  Pre-Cert#: [id number] [place] provided for CIR admission by Adonis Huguenin with [facility] Medicare for admission on 5/20 with updates due to [place] on 5/22 (phone [phone], fax [phone])      Employer:  Benefits:  Phone #: [phone]     Name:  Eff. Date: 07/14/2018     Deduct: $0      Out of Pocket Max: $3900 (met $1019.47)      Life Max: n/a CIR: $345/day for days 1-5      SNF: 20 full days Outpatient: 80%     Co-Pay: 20% Home Health: 100%      Co-Pay:  DME: 80%     Co-Pay: 20% Providers: in network  SECONDARY:       Policy#:       Subscriber:  CM Name:       Phone#:      Fax#:  Pre-Cert#:       Employer:  Benefits:  Phone #:      Name:  Eff. Date:      Deduct:       Out of Pocket Max:       Life Max:  CIR:       SNF:  Outpatient:      Co-Pay:  Home Health:       Co-Pay:  DME:      Co-Pay:   Medicaid Application Date:       Case Manager:  Disability Application Date:       Case Worker:   The "Data Collection Information Summary" for patients in Inpatient Rehabilitation Facilities with attached "Privacy Act [place] Records" was provided and verbally reviewed with: Patient  Emergency  Contact Information         Contact Information    Name Relation Home Work Mobile   [place] Daughter   [phone]      Current Medical History  Patient Admitting Diagnosis: R trimalleolar fracture, respiratory failure   History of Present Illness: Pt is a 73 y/o female with PMH of CAD, diastolic heart failure, CKD stage III, COPD on 4L home O2, type 2 DM, HTN, and chronic respiratory failure.  She was admitted on 11/22/2018 following a fall and found to have R ankle fracture dislocation.  Orthopedics was consulted and recommended ORIF which took place on 11/23/2018.  She is NWB on RLE.  Postop course complicated by respiratory failure and hypotension.  She required a bipap for respiratory failure, with difficulty weaning, and was weaned to 15L high flow nasal cannula  on 5/15, currently on 6-7L via nasal cannula.  Hypotension felt due to anemia after surgery and volume overload in the setting of cor pulmonale.  Recommendations for gentle hydration and midodrine for management.  Therapy evaluations completed and recommendations were made for CIR given patient's decline in functional status and complicated respiratory status.   Patient's medical record from [facility] has been reviewed by the rehabilitation admission coordinator and physician.  Past Medical History      Past Medical History:  Diagnosis Date  . Arthritis   . CAD (coronary artery disease)    a. Prior cath 2015 showed 40% prox AD, 50-50% mLAD, otherwise calcification but no obstruction in LCx/RCA.. Medical management recommended. b. 2017: low-risk NST.  [name] Chronic diastolic CHF (congestive heart failure) ([place])   . Chronic respiratory failure ([place])   . CKD (chronic kidney disease), stage III ([place])   . COPD (chronic obstructive pulmonary disease) ([place])   . Cor pulmonale ([name])    a. felt due to advanced COPD and noncompliance with O2.  . Depression   . Diabetes mellitus    [name]    dx  2008   . Fracture    left foot  . Hx of seasonal allergies   . Hypercholesteremia   . Hypertension    on no medications  . Lung cancer ([place])   . On home oxygen therapy    "2.5L; 24/7" (03/10/2017)  . Pericardial effusion    a. small-moderate in 07/2016.  . Pulmonary hypertension ([place])   . UTI (urinary tract infection)     Family History   family history includes Cancer in her sister; Diabetes in her brother and father; Heart failure in her brother and father.  Prior Rehab/Hospitalizations Has the patient had prior rehab or hospitalizations prior to admission? Yes  Has the patient had major surgery during 100 days prior to admission? Yes             Current Medications  Current Facility-Administered Medications:  .  0.9 %  sodium chloride infusion, , Intravenous, Continuous, Newt Minion, MD, Stopped at 11/24/18 2122 .  [DISCONTINUED] acetaminophen (TYLENOL) tablet 650 mg, 650 mg, Oral, Q6H PRN **OR** acetaminophen (TYLENOL) suppository 650 mg, 650 mg, Rectal, Q6H PRN, Newt Minion, MD, 650 mg at 11/25/18 0325 .  acetaminophen (TYLENOL) tablet 650 mg, 650 mg, Oral, Q6H PRN, Chesley Mires, MD, 650 mg at 11/30/18 1219 .  atorvastatin (LIPITOR) tablet 40 mg, 40 mg, Oral, Daily, Newt Minion, MD, 40 mg at 12/01/18 0902 .  bisacodyl (DULCOLAX) suppository 10 mg, 10 mg, Rectal, Daily PRN, Newt Minion, MD .  chlorhexidine (PERIDEX) 0.12 % solution 15 mL, 15 mL, Mouth Rinse, BID, Domenic Polite, MD, 15 mL at 11/30/18 1011 .  dextromethorphan-guaiFENesin (MUCINEX DM) 30-600 MG per 12 hr tablet 2 tablet, 2 tablet, Oral, BID, Domenic Polite, MD, 2 tablet at 12/01/18 0901 .  docusate sodium (COLACE) capsule 100 mg, 100 mg, Oral, BID, Newt Minion, MD, 100 mg at 12/01/18 0904 .  furosemide (LASIX) tablet 20 mg, 20 mg, Oral, BID, Domenic Polite, MD, 20 mg at 12/01/18 0902 .  gabapentin (NEURONTIN) capsule 300 mg, 300 mg, Oral, QHS, Domenic Polite, MD, 300 mg at 11/30/18  2125 .  gabapentin (NEURONTIN) capsule 300 mg, 300 mg, Oral, TID, Rigoberto Noel, MD, 300 mg at 12/01/18 0902 .  heparin injection 5,000 Units, 5,000 Units, Subcutaneous, Q8H, Domenic Polite, MD, 5,000 Units at 12/01/18 [phone] .  insulin aspart (  novoLOG) injection 0-9 Units, 0-9 Units, Subcutaneous, TID WC, Newt Minion, MD, 2 Units at 12/01/18 [phone] .  insulin detemir (LEVEMIR) injection 18 Units, 18 Units, Subcutaneous, QHS, Erick Colace, NP, 18 Units at 11/30/18 2125 .  ipratropium-albuterol (DUONEB) 0.5-2.5 (3) MG/3ML nebulizer solution 3 mL, 3 mL, Nebulization, Q4H PRN, Newt Minion, MD .  macitentan (OPSUMIT) tablet 10 mg, 10 mg, Oral, Daily, Halford Chessman, Vineet, MD, 10 mg at 11/30/18 1011 .  magnesium citrate solution 1 Bottle, 1 Bottle, Oral, Once PRN, Newt Minion, MD .  methocarbamol (ROBAXIN) tablet 500 mg, 500 mg, Oral, Q6H PRN **OR** methocarbamol (ROBAXIN) 500 mg in dextrose 5 % 50 mL IVPB, 500 mg, Intravenous, Q6H PRN, Newt Minion, MD .  midodrine (PROAMATINE) tablet 5 mg, 5 mg, Oral, TID WC, Domenic Polite, MD, 5 mg at 12/01/18 0904 .  mometasone-formoterol (DULERA) 200-5 MCG/ACT inhaler 2 puff, 2 puff, Inhalation, BID, Newt Minion, MD, 2 puff at 12/01/18 [phone] .  morphine 2 MG/ML injection 1 mg, 1 mg, Intravenous, Q2H PRN, Newt Minion, MD, 1 mg at 11/22/18 2148 .  ondansetron (ZOFRAN) tablet 4 mg, 4 mg, Oral, Q6H PRN **OR** ondansetron (ZOFRAN) injection 4 mg, 4 mg, Intravenous, Q6H PRN, Newt Minion, MD .  polyethylene glycol (MIRALAX / GLYCOLAX) packet 17 g, 17 g, Oral, Daily PRN, Newt Minion, MD .  Selexipag TABS 400 mcg, 400 mcg, Oral, BID, Chesley Mires, MD, 400 mcg at 11/30/18 2135 .  venlafaxine XR (EFFEXOR-XR) 24 hr capsule 75 mg, 75 mg, Oral, Q breakfast, Newt Minion, MD, 75 mg at 12/01/18 0902  Facility-Administered Medications Ordered in Other Encounters:  .  diatrizoate meglumine-sodium (GASTROGRAFIN) 66-10 % solution 30 mL, 30 mL, Oral, PRN,  Bjorn Loser, MD, 30 mL at 11/30/15 0820  Patients Current Diet:     Diet Order                  Diet - low sodium heart healthy         Diet heart healthy/carb modified Room service appropriate? Yes with Assist; Fluid consistency: Thin  Diet effective now               Precautions / Restrictions Precautions Precautions: Fall Other Brace: R CAM boot Restrictions Weight Bearing Restrictions: Yes RLE Weight Bearing: Non weight bearing   Has the patient had 2 or more falls or a fall with injury in the past year? Yes  Prior Activity Level Limited Community (1-2x/wk): was driving, getting out of the apartment 1-2x/week for errands  Prior Functional Level Self Care: Did the patient need help bathing, dressing, using the toilet or eating? Independent  Indoor Mobility: Did the patient need assistance with walking from room to room (with or without device)? Independent  Stairs: Did the patient need assistance with internal or external stairs (with or without device)? Independent  Functional Cognition: Did the patient need help planning regular tasks such as shopping or remembering to take medications? [place] / Equipment Home Assistive Devices/Equipment: Environmental consultant (specify type)(rolling) Home Equipment: Walker - 4 wheels, Shower seat, Grab bars - tub/shower, Grab bars - toilet  Prior Device Use: Indicate devices/aids used by the patient prior to current illness, exacerbation or injury? rollator  Current Functional Level Cognition  Overall Cognitive Status: Impaired/Different from baseline Orientation Level: Oriented X4 Safety/Judgement: Decreased awareness of deficits, Decreased awareness of safety General Comments: pt lacks insight into deficits and significant level of assist for  mobility and inability to return home alone.     Extremity Assessment (includes Sensation/Coordination)  Upper Extremity Assessment: Overall  WFL for tasks assessed  Lower Extremity Assessment: Defer to PT evaluation RLE Deficits / Details: s/p ORIF. At least anti gravity strength    ADLs  Overall ADL's : Needs assistance/impaired Eating/Feeding: Independent Grooming: Wash/dry hands, Wash/dry face, Oral care, Brushing hair, Set up, Sitting Upper Body Bathing: Set up, Sitting Lower Body Bathing: Moderate assistance, Sit to/from stand Upper Body Dressing : Set up, Sitting Lower Body Dressing: Maximal assistance, Sit to/from stand Toilet Transfer: Minimal assistance, Stand-pivot, BSC, RW Toileting- Clothing Manipulation and Hygiene: Maximal assistance, Sit to/from stand Functional mobility during ADLs: Minimal assistance, Rolling walker General ADL Comments: Pt demonstrates difficutly maintaining NWB during functional tasks     Mobility  Overal bed mobility: Needs Assistance Bed Mobility: Supine to Sit Supine to sit: Supervision Sit to supine: Min guard General bed mobility comments: in recliner on arrival    Transfers  Overall transfer level: Needs assistance Equipment used: Rolling walker (2 wheeled) Transfers: Squat Pivot Transfers Sit to Stand: Min assist Stand pivot transfers: Min assist Squat pivot transfers: Min assist, Mod assist, +2 safety/equipment General transfer comment: pt in recliner on arrival with W/C placed to pt's left. Initial transfer to left pt with min +2 assist with RLE propped on P.T. foot to control NWB as pt continues to try to attempt pushing through right foot. Sequential cues with assist to reach for armrest and max assist for management of W/C parts. Return transfer to pt's right with RLE on P.T. foot and increased assist of mod assist to transition W/C to recline with sequential cues and decreased control of descent    Ambulation / Gait / Stairs / Wheelchair Mobility  Ambulation/Gait General Gait Details: unable Product manager mobility: Yes Wheelchair propulsion:  Both upper extremities, Left lower extremity Wheelchair parts: Needs assistance Distance: 85'  Wheelchair Assistance Details (indicate cue type and reason): pt required max assist to manage bil legrest, max cues for brakes and physical assist to move W/C around corners. Pt fatigued after 50' of W/C ambulation and unable to progress further    Posture / Balance Balance Overall balance assessment: Needs assistance Sitting-balance support: Feet supported Sitting balance-Leahy Scale: Good Standing balance support: Bilateral upper extremity supported Standing balance-Leahy Scale: Poor Standing balance comment: reliant on UE support unable to maintain RLE NWB    Special needs/care consideration BiPAP/CPAP no CPM no Continuous Drip IV no Dialysis no        Days n/a Life Vest no Oxygen yes, 6L via nasal cannula (4L baseline, per Dr. Florene Glen, continue to attempt to wean to baseline) Special Bed no Trach Size no Wound Vac (area) yes      Location R ankle Skin abrasion to hand/nose, excoriation to buttocks, incision to R ankle                              Bowel mgmt: continent, last BM 11/30/2018 Bladder mgmt: continent Diabetic mgmt: yes, insulin Behavioral consideration no Chemo/radiation no   Previous Home Environment (from acute therapy documentation) Living Arrangements: Alone Available Help at Discharge: Family, Available PRN/intermittently Type of Home: Apartment Home Layout: One level Home Access: Level entry Bathroom Shower/Tub: Chiropodist: Handicapped height Home Care Services: Yes [place] (if known): [facility] Additional Comments: Pt reports she has a w/c accessible apt   Discharge  Living Setting Plans for Discharge Living Setting: Patient's home Type of Home at Discharge: Apartment Discharge Home Layout: One level Discharge Home Access: Level entry Discharge Bathroom Shower/Tub: Tub/shower unit Discharge Bathroom Toilet:  Standard Discharge Bathroom Accessibility: Yes How Accessible: Accessible via wheelchair, Accessible via walker Does the patient have any problems obtaining your medications?: No  Social/Family/Support Systems Anticipated Caregiver: pt's daughter, Rayna Anticipated Caregiver's Contact Information: [phone] Ability/Limitations of Caregiver: Rayna works FT and is a single mom to 2 children, she will only be able to provide intermittent supervision at best.  Pt will need to be mod I from w/c level for d/c home to accessible apartment.  Caregiver Availability: Intermittent Discharge Plan Discussed with Primary Caregiver: Yes Is Caregiver In Agreement with Plan?: Yes Does Caregiver/Family have Issues with Lodging/Transportation while Pt is in Rehab?: No  Goals/Additional Needs Patient/Family Goal for Rehab: PT/OT mod I w/c level Expected length of stay: 10-14 days Dietary Needs: heart health/carb modified, thin Equipment Needs: tbd Additional Information: wound vac to R ankle Pt/Family Agrees to Admission and willing to participate: Yes Program Orientation Provided & Reviewed with Pt/Caregiver Including Roles  & Responsibilities: Yes   Possible need for SNF placement upon discharge: not anticipated  Patient Condition: I have reviewed medical records from [facility], spoken with CSW, and patient and daughter. I met with patient at the bedside and discussed with daughter via phone for inpatient rehabilitation assessment.  Patient will benefit from ongoing PT and OT, can actively participate in 3 hours of therapy a day 5 days of the week, and can make measurable gains during the admission.  Patient will also benefit from the coordinated team approach during an Inpatient Acute Rehabilitation admission.  The patient will receive intensive therapy as well as Rehabilitation physician, nursing, social worker, and care management interventions.  Due to safety, skin/wound care, disease  management, medication administration, pain management and patient education the patient requires 24 hour a day rehabilitation nursing.  The patient is currently min +2 with mobility and basic ADLs.  Discharge setting and therapy post discharge at home with home health is anticipated.  Patient has agreed to participate in the Acute Inpatient Rehabilitation Program and will admit today.  Preadmission Screen Completed By:  Michel Santee, PT, DPT 12/01/2018 10:28 AM ______________________________________________________________________   Discussed status with Dr. Posey Pronto on 12/01/18  at 10:44 AM  and received approval for admission today.  Admission Coordinator:  Michel Santee, PT, DPT 10:44 AM Sudie Grumbling 12/01/18    Assessment/Plan: Diagnosis: R trimalleolar fracture, respiratory failure   1. Does the need for close, 24 hr/day Medical supervision in concert with the patient's rehab needs make it unreasonable for this patient to be served in a less intensive setting? Potentially  2. Co-Morbidities requiring supervision/potential complications: CAD, diastolic heart failure, CKD stage III, COPD, type 2 DM, HTN 3. Due to safety, skin/wound care, disease management, pain management and patient education, does the patient require 24 hr/day rehab nursing? Potentially 4. Does the patient require coordinated care of a physician, rehab nurse, PT (1-2 hrs/day, 5 days/week) and OT (1-2 hrs/day, 5 days/week) to address physical and functional deficits in the context of the above medical diagnosis(es)? Potentially Addressing deficits in the following areas: balance, endurance, locomotion, strength, transferring, bathing, dressing, toileting and psychosocial support 5. Can the patient actively participate in an intensive therapy program of at least 3 hrs of therapy 5 days a week? Yes 6. The potential for patient to make measurable gains while on  inpatient rehab is excellent 7. Anticipated functional outcomes upon  discharge from inpatients are: Mod I at wheelchair level PT, Mod I at wheelchair level OT, n/a SLP 8. Estimated rehab length of stay to reach the above functional goals is: 10-14 days. 9. Anticipated D/C setting: Home 10. Anticipated post D/C treatments: HH therapy and Home excercise program 11. Overall Rehab/Functional Prognosis: excellent and good  MD Signature: Delice Lesch, MD, ABPMR        Revision History

## 2018-12-01 NOTE — Progress Notes (Signed)
Pt HR dropped to 31. MD notified. Will continue to monitor.

## 2018-12-01 NOTE — Progress Notes (Signed)
Pt seen and evaluated today.   Pt d/c to CIR awaiting bed.  See d/c summary from 5/19. Pt currently on 7 L [place], discussed with nurse, will plan to wean as tolerated.  Continue to follow closely in CIR.   Otherwise, per d/c summary 5/20. Labs stable Pt NAD, RRR, unlabored breathing without additional breath sounds.  R boot in place.  Wound vac.

## 2018-12-01 NOTE — Progress Notes (Signed)
Occupational Therapy Treatment Patient Details Name: Amy Shepard MRN: [id number] DOB: 09/08/1945 Today's Date: 12/01/2018    History of present illness Pt is a 73 y.o. F with significant PMH of COPD/chronic respiratory failure, severe pulmonary hypertension, chronic diastolic CHF, diabetes, stage III CKD, CAD who presents following a fall and a right trimalleolar ankle fracture. Now s/p ORIF 5/13    OT comments  Pt is currently making progress towards OT goals. She is improving in her ability to transfer with RW to the [facility]. She continues to demonstrate TDWB and is unable to maintain NWB at this time. She was able to perform seated grooming tasks with set up, and remains max A for LB ADL (especially dressing). Session also focused on wheelchair education with Pt able to demonstrate initial tasks like propulsion, managing breaks. OT will continue to follow acutely and CIR remains essential for max independence.  Follow Up Recommendations  CIR    Equipment Recommendations  Tub/shower bench    Recommendations for Other Services Rehab consult    Precautions / Restrictions Precautions Precautions: Fall Required Braces or Orthoses: Other Brace Other Brace: R CAM boot Restrictions Weight Bearing Restrictions: Yes RLE Weight Bearing: Non weight bearing       Mobility Bed Mobility               General bed mobility comments: OOB in recliner  Transfers Overall transfer level: Needs assistance Equipment used: Rolling walker (2 wheeled) Transfers: Sit to/from [facility] Sit to Stand: Min assist;+2 safety/equipment Stand pivot transfers: Min assist       General transfer comment: minA for sit to stand to RW, vc for hand placement for power up and assist to maintain NWB, minA for steadying with pivoting from w/c to recliner    Balance   Sitting-balance support: Feet supported Sitting balance-Leahy Scale: Good     Standing balance support: Bilateral  upper extremity supported Standing balance-Leahy Scale: Poor                             ADL either performed or assessed with clinical judgement   ADL Overall ADL's : Needs assistance/impaired     Grooming: Wash/dry hands;Wash/dry face;Oral care;Set up;Sitting                   Toilet Transfer: Minimal assistance;+2 for physical assistance;+2 for safety/equipment;Stand-pivot;BSC;RW Toilet Transfer Details (indicate cue type and reason): magaged wheelchair brakes Toileting- Clothing Manipulation and Hygiene: Maximal assistance;Sit to/from stand       Functional mobility during ADLs: Minimal assistance;+2 for safety/equipment;Rolling walker;Wheelchair(wc transfers) General ADL Comments: Pt demonstrates difficutly maintaining NWB during functional tasks      Vision       Perception     Praxis      Cognition Arousal/Alertness: Awake/alert Behavior During Therapy: WFL for tasks assessed/performed Overall Cognitive Status: Within Functional Limits for tasks assessed                                          Exercises     Shoulder Instructions       General Comments Pt on 6L O2 via [place], 3/4 DoE with transfers and drop in O2 to 85%O2 cues for pursed lipped breathing and SaO2 returned ot 90%O2    Pertinent Vitals/ Pain       Pain Assessment:  Faces Faces Pain Scale: Hurts even more Pain Location: sore right ankle and left great toe Pain Descriptors / Indicators: Sore;Aching Pain Intervention(s): Limited activity within patient's tolerance;Monitored during session;Repositioned  Home Living                                          Prior Functioning/Environment              Frequency  Min 2X/week        Progress Toward Goals  OT Goals(current goals can now be found in the care plan section)  Progress towards OT goals: Progressing toward goals  Acute Rehab OT Goals Patient Stated Goal: To get stronger to  regain independence and take care of self  OT Goal Formulation: With patient Time For Goal Achievement: 12/12/18 Potential to Achieve Goals: Good  Plan Discharge plan remains appropriate;Frequency remains appropriate    Co-evaluation    PT/OT/SLP Co-Evaluation/Treatment: Yes Reason for Co-Treatment: For patient/therapist safety;To address functional/ADL transfers PT goals addressed during session: Mobility/safety with mobility;Balance OT goals addressed during session: ADL's and self-care;Proper use of Adaptive equipment and DME;Strengthening/ROM      AM-PAC OT "6 Clicks" Daily Activity     Outcome Measure   Help from another person eating meals?: None Help from another person taking care of personal grooming?: A Little Help from another person toileting, which includes using toliet, bedpan, or urinal?: A Lot Help from another person bathing (including washing, rinsing, drying)?: A Lot Help from another person to put on and taking off regular upper body clothing?: None Help from another person to put on and taking off regular lower body clothing?: A Lot 6 Click Score: 17    End of Session Equipment Utilized During Treatment: Rolling walker;Oxygen(wheelchair)  OT Visit Diagnosis: Pain Pain - Right/Left: Right Pain - part of body: Ankle and joints of foot   Activity Tolerance Patient tolerated treatment well   Patient Left in chair;with call bell/phone within reach   Nurse Communication Mobility status;Patient requests pain meds        Time: 8088-1103 OT Time Calculation (min): 48 min  Charges: OT General Charges $OT Visit: 1 Visit OT Treatments $Self Care/Home Management : 8-22 mins $Therapeutic Activity: 8-22 mins  Hulda Humphrey OTR/L Acute Rehabilitation Services Pager: [phone] Office: [place] 12/01/2018, 1:15 PM

## 2018-12-01 NOTE — H&P (Signed)
Physical Medicine and Rehabilitation Admission H&P    Chief Complaint  Patient presents with   Functional deficits   Fall with ankle fracture and hypoxia     HPI:  Amy Shepard is a 73 year old female with history of CAD, CKD, COPD with chronic hypoxic/hypercapnic respiratory failure- 4 L oxygen dependent, lung cancer, V2ZD, chronic systolic CHF with pulmonary HTN, gait disorder who sustained a fall due to weakness, twisted her right ankle and had sudden onset of severe pain.  History taken from chart review and patient.  She was found to have trimalleolar right ankle fracture and underwent ORIF of right ankle on 11/23/2018 by Dr. Sharol Given. To be nonweightbearing X 3 weeks with incisional VAC X one week.   Post op course complicated by hypotension and somnolence requiring pressors. PCCM consulted and patient treated with IV lasix, BIPAP and midodrine resumed as well as BDs as symptoms felt to be multifactorial due to anemia, fluid overload in setting of cor pulmonale, sleep related hypoventilation. She was found to have ABLA and was transfused 2 units PRBCs. Opsumit and Selex[place] resumed,  off BIPAP but has required HF oxygen now down to 6- 7L per [place] due to increased oxygen needs--pulmonary recommendations  keeping SpO2 90-92%. Therapy ongoing and patient continues to be limited by weakness, fatigue and NWB status affecting mobility and ADLs. CIR recommended due to functional decline.    Review of Systems  Constitutional: Negative for chills and fever.  HENT: Negative for hearing loss and tinnitus.   Eyes: Negative for blurred vision and double vision.  Respiratory: Positive for cough, shortness of breath and wheezing.   Cardiovascular: Negative for chest pain, palpitations and leg swelling.  Gastrointestinal: Negative for constipation and heartburn.  Genitourinary: Negative for frequency and urgency.  Musculoskeletal: Positive for joint pain and myalgias. Negative for back pain and  neck pain.  Skin: Negative for itching and rash.  Neurological: Positive for sensory change (numbness/tingling hands/feet) and weakness.  All other systems reviewed and are negative.    Past Medical History:  Diagnosis Date   Arthritis    CAD (coronary artery disease)    a. Prior cath 2015 showed 40% prox AD, 50-50% mLAD, otherwise calcification but no obstruction in LCx/RCA.. Medical management recommended. b. 2017: low-risk NST.   Chronic diastolic CHF (congestive heart failure) ([name])    Chronic respiratory failure ([name])    CKD (chronic kidney disease), stage III ([name])    COPD (chronic obstructive pulmonary disease) ([name])    Cor pulmonale ([name])    a. felt due to advanced COPD and noncompliance with O2.   Depression    Diabetes mellitus    januvia    dx  2008   Fracture    left foot   Hx of seasonal allergies    Hypercholesteremia    Hypertension    on no medications   Lung cancer ([place])    On home oxygen therapy    "2.5L; 24/7" (03/10/2017)   Pericardial effusion    a. small-moderate in 07/2016.   Pulmonary hypertension ([place])    UTI (urinary tract infection)     Past Surgical History:  Procedure Laterality Date   APPLICATION OF WOUND VAC Right 11/23/2018   Procedure: Application Of Wound Vac;  Surgeon: Newt Minion, MD;  Location: [place];  Service: Orthopedics;  Laterality: Right;   [place] or [facility]  [facility] [place]  bilateral cataracts   LEFT HEART CATHETERIZATION WITH CORONARY ANGIOGRAM N/A 07/12/2014   Procedure: LEFT HEART CATHETERIZATION WITH CORONARY ANGIOGRAM;  Surgeon: [name], MD;  Location: [facility] C[facility];  Service: Cardiovascular;  Laterality: N/A;   MAXIMUM ACCESS (MAS)POSTERIOR LUMBAR INTERBODY FUSION (PLIF) 2 LEVEL N/A 02/22/2016   Procedure: Lumbar three-four - Lumbar four-five  MAXIMUM ACCESS SURGERY  POSTERIOR LUMBAR INTERBODY FUSION;  Surgeon: [name], MD;  Location: [place]  NEURO ORS;  Service: Neurosurgery;  Laterality: N/A;   ORIF ANKLE FRACTURE Right 11/23/2018   Procedure: OPEN REDUCTION INTERNAL FIXATION (ORIF) RIGHT ANKLE FRACTURE;  Surgeon: Newt Minion, MD;  Location: [place];  Service: Orthopedics;  Laterality: Right;   ORIF TOE FRACTURE Left 06/12/2017   Procedure: OPEN REDUCTION INTERNAL FIXATION (ORIF) BASE 1ST METATARSAL (TOE) FRACTURE;  Surgeon: Newt Minion, MD;  Location: [place];  Service: Orthopedics;  Laterality: Left;   RIGHT HEART CATH N/A 08/06/2017   Procedure: RIGHT HEART CATH;  Surgeon: Larey Dresser, MD;  Location: [place] CV LAB;  Service: Cardiovascular;  Laterality: N/A;   THORACOTOMY Right 2010   lower   TONSILLECTOMY      Family History  Problem Relation Age of Onset   Diabetes Father    Heart failure Father    Cancer Sister    Diabetes Brother    Heart failure Brother     Social History:   Lives alone. Retired Marine scientist. Independent with walker PTA. reports that she quit smoking about 12 years ago. She has a 30.00 pack-year smoking history. She has never used smokeless tobacco. She reports that she does not drink alcohol or use drugs.    Allergies  Allergen Reactions   Amoxicillin Anaphylaxis, Hives, Rash and Other (See Comments)    Has patient had a PCN reaction causing immediate rash, facial/tongue/throat swelling, SOB or lightheadedness with hypotension: YES Positive reaction causing SEVERE RASH INVOLVING MUCUS MEMBRANES/SKIN NECROSIS: YES Reaction that required HOSPITALIZATION: YES Reaction occurring within the last 10 years: NO    Medications Prior to Admission  Medication Sig Dispense Refill   atorvastatin (LIPITOR) 40 MG tablet Take 40 mg by mouth daily.      furosemide (LASIX) 40 MG tablet Take 1 tablet (40 mg total) by mouth 2 (two) times daily. 60 tablet 3   insulin lispro (HUMALOG) 100 UNIT/ML injection Inject 2-11 Units into the skin See admin instructions. Inject 6 units with each meal and  add additional units per sliding scale: BGL 101-150 = 2 units; 151-200 = 3 units; 201-250 = 5 units; 251-300 = 7 units; 301-350 = 9 units; >350 = 11 units; CALL MD IF BGL IS >400 OR <60     macitentan (OPSUMIT) 10 MG tablet Take 1 tablet (10 mg total) by mouth daily. 30 tablet 11   OXYGEN Inhale 3 L into the lungs continuous. FOR COPD     venlafaxine XR (EFFEXOR-XR) 75 MG 24 hr capsule Take 75 mg by mouth daily with breakfast.      [DISCONTINUED] gabapentin (NEURONTIN) 300 MG capsule Take 300 mg by mouth 3 (three) times daily.     [DISCONTINUED] Insulin Detemir (LEVEMIR FLEXTOUCH) 100 UNIT/ML Pen Inject 36 Units into the skin at bedtime.      [DISCONTINUED] Selexipag (UPTRAVI) 200 MCG TABS Take 3 tablets (600 mcg total) by mouth 2 (two) times daily. (Patient taking differently: Take 400 mcg by mouth 2 (two) times a day. ) 180 tablet 11   acetaminophen (TYLENOL) 325 MG  tablet Take 650 mg by mouth every 6 (six) hours as needed for moderate pain or fever.      atorvastatin (LIPITOR) 40 MG tablet Take 1 tablet (40 mg total) by mouth daily. 90 tablet 3   Fluticasone-Salmeterol (ADVAIR) 250-50 MCG/DOSE AEPB Inhale 1 puff into the lungs 2 (two) times daily as needed (shortness of breath/wheezing).      ipratropium-albuterol (DUONEB) 0.5-2.5 (3) MG/3ML SOLN Take 3 mLs by nebulization every 4 (four) hours as needed (wheezing, Shortness of breath). 360 mL 0   midodrine (PROAMATINE) 5 MG tablet Take 1 tablet (5 mg total) by mouth 3 (three) times daily with meals. (Patient not taking: Reported on 11/22/2018) 30 tablet 3   potassium chloride SA (K-DUR,KLOR-CON) 20 MEQ tablet Take 2 tablets (40 mEq total) by mouth daily. (Patient taking differently: Take 40 mEq by mouth daily as needed (low potassium). ) 60 tablet 3   traMADol (ULTRAM) 50 MG tablet Take 1 tablet (50 mg total) by mouth every 8 (eight) hours as needed (for pain). 10 tablet 0   TRULICITY 1.5 DJ/2.4QA SOPN Inject 1.5 mg into the vein once a  week.       Drug Regimen Review  Drug regimen was reviewed and remains appropriate with no significant issues identified  Home: Home Living Family/patient expects to be discharged to:: Private residence Living Arrangements: Alone Available Help at Discharge: Family, Available PRN/intermittently Type of Home: Apartment Home Access: Level entry Home Layout: One level Bathroom Shower/Tub: Chiropodist: Handicapped height Home Equipment: Environmental consultant - 4 wheels, Shower seat, Grab bars - tub/shower, Grab bars - toilet Additional Comments: Pt reports she has a w/c accessible apt    Functional History: Prior Function Level of Independence: Independent with assistive device(s) Comments: Drives; uses Rollator, independent with ADL's  Functional Status:  Mobility: Bed Mobility Overal bed mobility: Needs Assistance Bed Mobility: Supine to Sit Supine to sit: Supervision Sit to supine: Min guard General bed mobility comments: in recliner on arrival Transfers Overall transfer level: Needs assistance Equipment used: Rolling walker (2 wheeled) Transfers: Squat Pivot Transfers Sit to Stand: Min assist Stand pivot transfers: Min assist Squat pivot transfers: Min assist, Mod assist, +2 safety/equipment General transfer comment: pt in recliner on arrival with W/C placed to pt's left. Initial transfer to left pt with min +2 assist with RLE propped on P.T. foot to control NWB as pt continues to try to attempt pushing through right foot. Sequential cues with assist to reach for armrest and max assist for management of W/C parts. Return transfer to pt's right with RLE on P.T. foot and increased assist of mod assist to transition W/C to recline with sequential cues and decreased control of descent Ambulation/Gait General Gait Details: unable Wheelchair Mobility Wheelchair mobility: Yes Wheelchair propulsion: Both upper extremities, Left lower extremity Wheelchair parts: Needs  assistance Distance: 39'  Wheelchair Assistance Details (indicate cue type and reason): pt required max assist to manage bil legrest, max cues for brakes and physical assist to move W/C around corners. Pt fatigued after 50' of W/C ambulation and unable to progress further  ADL: ADL Overall ADL's : Needs assistance/impaired Eating/Feeding: Independent Grooming: Wash/dry hands, Wash/dry face, Oral care, Brushing hair, Set up, Sitting Upper Body Bathing: Set up, Sitting Lower Body Bathing: Moderate assistance, Sit to/from stand Upper Body Dressing : Set up, Sitting Lower Body Dressing: Maximal assistance, Sit to/from stand Toilet Transfer: Minimal assistance, Stand-pivot, BSC, RW Toileting- Clothing Manipulation and Hygiene: Maximal assistance, Sit to/from stand Functional  mobility during ADLs: Minimal assistance, Rolling walker General ADL Comments: Pt demonstrates difficutly maintaining NWB during functional tasks   Cognition: Cognition Overall Cognitive Status: Impaired/Different from baseline Orientation Level: Oriented X4 Cognition Arousal/Alertness: Awake/alert Behavior During Therapy: WFL for tasks assessed/performed Overall Cognitive Status: Impaired/Different from baseline Area of Impairment: Safety/judgement Safety/Judgement: Decreased awareness of deficits, Decreased awareness of safety General Comments: pt lacks insight into deficits and significant level of assist for mobility and inability to return home alone.    Blood pressure (!) 115/55, pulse 76, temperature 97.9 F (36.6 C), temperature source Oral, resp. rate 18, height _0  (1.549 m), weight 81.6 kg, SpO2 98 %. Physical Exam  Nursing note and vitals reviewed. Constitutional: She is oriented to person, place, and time. She appears well-developed.  Obesity  HENT:  Head: Normocephalic.  Facial abrasions  Eyes: EOM are normal. Right eye exhibits no discharge. Left eye exhibits no discharge.  Respiratory: Effort  normal. No respiratory distress.  Upper airway congested sounds. Expiratory wheezes  in BLL + [place]  GI: She exhibits no distension.  Musculoskeletal:     Comments: Wound VAC/CAM boot in place on right ankle . RLE with edema and tenderness  Neurological: She is alert and oriented to person, place, and time.  Motor: Bilateral upper extremities, left lower extremity: 5/5 proximal distally left lower extremity: Hip flexion 5/5, knee extension 4+/5, ankle braced  Skin: Skin is warm and dry.  Psychiatric: She has a normal mood and affect. Her behavior is normal.    Results for orders placed or performed during the hospital encounter of 11/22/18 (from the past 48 hour(s))  Glucose, capillary     Status: Abnormal   Collection Time: 11/29/18 12:08 PM  Result Value Ref Range   Glucose-Capillary 174 (H) 70 - 99 mg/dL  Glucose, capillary     Status: Abnormal   Collection Time: 11/29/18  4:51 PM  Result Value Ref Range   Glucose-Capillary 161 (H) 70 - 99 mg/dL  Glucose, capillary     Status: Abnormal   Collection Time: 11/29/18  9:14 PM  Result Value Ref Range   Glucose-Capillary 217 (H) 70 - 99 mg/dL  Glucose, capillary     Status: Abnormal   Collection Time: 11/30/18  5:53 AM  Result Value Ref Range   Glucose-Capillary 157 (H) 70 - 99 mg/dL  Glucose, capillary     Status: Abnormal   Collection Time: 11/30/18 12:08 PM  Result Value Ref Range   Glucose-Capillary 148 (H) 70 - 99 mg/dL  Glucose, capillary     Status: Abnormal   Collection Time: 11/30/18  4:10 PM  Result Value Ref Range   Glucose-Capillary 138 (H) 70 - 99 mg/dL  Glucose, capillary     Status: Abnormal   Collection Time: 11/30/18  8:49 PM  Result Value Ref Range   Glucose-Capillary 214 (H) 70 - 99 mg/dL  Glucose, capillary     Status: Abnormal   Collection Time: 12/01/18  5:46 AM  Result Value Ref Range   Glucose-Capillary 180 (H) 70 - 99 mg/dL  CBC     Status: Abnormal   Collection Time: 12/01/18  7:34 AM  Result Value  Ref Range   WBC 6.9 4.0 - 10.5 K/uL   RBC 3.40 (L) 3.87 - 5.11 MIL/uL   Hemoglobin 8.1 (L) 12.0 - 15.0 g/dL   HCT 26.9 (L) 36.0 - 46.0 %   MCV 79.1 (L) 80.0 - 100.0 fL   MCH 23.8 (L) 26.0 - 34.0 pg  MCHC 30.1 30.0 - 36.0 g/dL   RDW 19.0 (H) 11.5 - 15.5 %   Platelets 240 150 - 400 K/uL   nRBC 0.0 0.0 - 0.2 %    Comment: Performed at [facility], [place] [address], [place], [place] [address]  Basic metabolic panel     Status: Abnormal   Collection Time: 12/01/18  7:34 AM  Result Value Ref Range   Sodium 138 135 - 145 mmol/L   Potassium 4.1 3.5 - 5.1 mmol/L   Chloride 99 98 - 111 mmol/L   CO2 29 22 - 32 mmol/L   Glucose, Bld 146 (H) 70 - 99 mg/dL   BUN 28 (H) 8 - 23 mg/dL   Creatinine, Ser 1.22 (H) 0.44 - 1.00 mg/dL   Calcium 9.4 8.9 - 10.3 mg/dL   GFR calc non Af Amer 44 (L) >60 mL/min   GFR calc Af Amer 51 (L) >60 mL/min   Anion gap 10 5 - 15    Comment: Performed at [facility] [address], [place], [place] [address]  Magnesium     Status: None   Collection Time: 12/01/18  7:34 AM  Result Value Ref Range   Magnesium 2.1 1.7 - 2.4 mg/dL    Comment: Performed at [facility] [address], [place], [place] [address]   No results found.     Medical Problem List and Plan: 1.  Functional deficits secondary to right trimalleolar ankle fracture and acute on chronic respiratory failure.    Admit to CIR 2.  Antithrombotics: -DVT/anticoagulation:  Pharmaceutical: Lovenox  -antiplatelet therapy: N/A. 3. Pain Management:  Controlled with prn tylenol and robaxin.  Monitor with increased mobility 4. Mood: LCSW to follow for evaluation and support.   -antipsychotic agents: N/A 5. Neuropsych: This patient is capable of making decisions on her own behalf. 6. Skin/Wound Care: Routine pressure relief measures. Will plan to remove wound VAC as 7+ days post op. 7. Fluids/Electrolytes/Nutrition: Monitor I/O.  BMP ordered for tomorrow a.m. 8. H/o Lung cancer/COPD-  O2 dependent: Currently on 6L HF oxygen--> weaned to 4L HF this afternoon.  Continue to wean supplemental oxygen as tolerated.  Continue Dulera bid with duonebs prn.  9. Chronic systolic CHF with pulmonary HTN: Monitor weights.  Continue Opsumit and Selexipag. On Lipitor. Currently stable on lower dose lasix.  10. T2DM;  Continue Levemir and titrate as needed. Off Trulicity at this time. Will add meal coverage as at home and use SSI for elevated BS. Continue to monitor BS ac/hs.  Monitor with increased mobility. 11. CKD:  Continue to monitor renal status with serial checks. Has improved with SCr-1.22.  BMP ordered for tomorrow a.m. 12. Chronic hypotension: Monitor BP tid. On midodrine with BP improving.  Monitor with increased mobility. 13. Acute on chronic iron deficiency anemia: Baseline Hgb in 9 range. Has improved past transfusion. Add iron supplement. Will continue to monitor. CBC ordered for tomorrow.  14. Right trimalleolar ankle fracture: NWB X 3 weeks. CAM at all times.   Post Admission Physician Evaluation: 1. Preadmission assessment reviewed and changes made below. 2. Functional deficits secondary  to debility. 3. Patient is admitted to receive collaborative, interdisciplinary care between the physiatrist, rehab nursing staff, and therapy team. 4. Patient has experienced substantial functional loss from his/her baseline which was documented above under the "Functional History" and "Functional Status" headings.  Judging by the patient's diagnosis, physical exam, and functional history, the patient has potential for functional progress  which will result in measurable gains while on inpatient rehab.  These gains will be of substantial and practical use upon discharge  in facilitating mobility and self-care at the household level. 5. Physiatrist will provide 24 hour management of medical needs as well as oversight of the therapy plan/treatment and provide guidance as appropriate regarding the  interaction of the two. 6. 24 hour rehab nursing will assist with bowel management, safety, skin/wound care, disease management, pain management and patient education  and help integrate therapy concepts, techniques,education, etc. 7. PT will assess and treat for/with: Lower extremity strength, range of motion, stamina, balance, functional mobility, safety, adaptive techniques and equipment, coping skills, pain control, education. Goals are: Supervision. 8. OT will assess and treat for/with: ADL's, functional mobility, safety, upper extremity strength, adaptive techniques and equipment, ego support, and community reintegration.   Goals are: Supervision/Min A. Therapy may not proceed with showering this patient. 9. Case Management and Social Worker will assess and treat for psychological issues and discharge planning. 10. Team conference will be held weekly to assess progress toward goals and to determine barriers to discharge. 11. Patient will receive at least 3 hours of therapy per day at least 5 days per week. 12. ELOS: 6-9 days.       13. Prognosis:  excellent and good  I have personally performed a face to face diagnostic evaluation, including, but not limited to relevant history and physical exam findings, of this patient and developed relevant assessment and plan.  Additionally, I have reviewed and concur with the physician assistant's documentation above.  Delice Lesch, MD, ABPMR Bary Leriche, PA-C 12/01/2018

## 2018-12-01 NOTE — Progress Notes (Signed)
Patient received to [phone] by low-bed transportation. Report received by RN. O2 in place at 4L via [place], wound vac in place to right lower leg. No s/s of distress. Assessment performed, skin assessment performed with 2nd RN, hilary. No complications noted at this time, continue plan of care.  Audie Clear, LPN

## 2018-12-02 ENCOUNTER — Inpatient Hospital Stay (HOSPITAL_COMMUNITY): Payer: Medicare Other | Admitting: Occupational Therapy

## 2018-12-02 ENCOUNTER — Inpatient Hospital Stay (HOSPITAL_COMMUNITY): Payer: Medicare Other | Admitting: Physical Therapy

## 2018-12-02 DIAGNOSIS — E1169 Type 2 diabetes mellitus with other specified complication: Secondary | ICD-10-CM

## 2018-12-02 DIAGNOSIS — S82851S Displaced trimalleolar fracture of right lower leg, sequela: Secondary | ICD-10-CM

## 2018-12-02 DIAGNOSIS — J441 Chronic obstructive pulmonary disease with (acute) exacerbation: Secondary | ICD-10-CM

## 2018-12-02 DIAGNOSIS — E669 Obesity, unspecified: Secondary | ICD-10-CM

## 2018-12-02 LAB — CBC WITH DIFFERENTIAL/PLATELET
Abs Immature Granulocytes: 0.09 10*3/uL — ABNORMAL HIGH (ref 0.00–0.07)
Basophils Absolute: 0 10*3/uL (ref 0.0–0.1)
Basophils Relative: 0 %
Eosinophils Absolute: 0.3 10*3/uL (ref 0.0–0.5)
Eosinophils Relative: 4 %
HCT: 27.1 % — ABNORMAL LOW (ref 36.0–46.0)
Hemoglobin: 8.3 g/dL — ABNORMAL LOW (ref 12.0–15.0)
Immature Granulocytes: 1 %
Lymphocytes Relative: 12 %
Lymphs Abs: 0.8 10*3/uL (ref 0.7–4.0)
MCH: 24.3 pg — ABNORMAL LOW (ref 26.0–34.0)
MCHC: 30.6 g/dL (ref 30.0–36.0)
MCV: 79.5 fL — ABNORMAL LOW (ref 80.0–100.0)
Monocytes Absolute: 0.8 10*3/uL (ref 0.1–1.0)
Monocytes Relative: 12 %
Neutro Abs: 5 10*3/uL (ref 1.7–7.7)
Neutrophils Relative %: 71 %
Platelets: 216 10*3/uL (ref 150–400)
RBC: 3.41 MIL/uL — ABNORMAL LOW (ref 3.87–5.11)
RDW: 19.3 % — ABNORMAL HIGH (ref 11.5–15.5)
WBC: 7.1 10*3/uL (ref 4.0–10.5)
nRBC: 0 % (ref 0.0–0.2)

## 2018-12-02 LAB — COMPREHENSIVE METABOLIC PANEL
ALT: 12 U/L (ref 0–44)
AST: 17 U/L (ref 15–41)
Albumin: 2.8 g/dL — ABNORMAL LOW (ref 3.5–5.0)
Alkaline Phosphatase: 120 U/L (ref 38–126)
Anion gap: 11 (ref 5–15)
BUN: 32 mg/dL — ABNORMAL HIGH (ref 8–23)
CO2: 25 mmol/L (ref 22–32)
Calcium: 9.1 mg/dL (ref 8.9–10.3)
Chloride: 103 mmol/L (ref 98–111)
Creatinine, Ser: 1.39 mg/dL — ABNORMAL HIGH (ref 0.44–1.00)
GFR calc Af Amer: 44 mL/min — ABNORMAL LOW (ref >60)
GFR calc non Af Amer: 38 mL/min — ABNORMAL LOW (ref >60)
Glucose, Bld: 174 mg/dL — ABNORMAL HIGH (ref 70–99)
Potassium: 4.2 mmol/L (ref 3.5–5.1)
Sodium: 139 mmol/L (ref 135–145)
Total Bilirubin: 0.7 mg/dL (ref 0.3–1.2)
Total Protein: 5.8 g/dL — ABNORMAL LOW (ref 6.5–8.1)

## 2018-12-02 LAB — GLUCOSE, CAPILLARY
Glucose-Capillary: 131 mg/dL — ABNORMAL HIGH (ref 70–99)
Glucose-Capillary: 161 mg/dL — ABNORMAL HIGH (ref 70–99)
Glucose-Capillary: 108 mg/dL — ABNORMAL HIGH (ref 70–99)
Glucose-Capillary: 177 mg/dL — ABNORMAL HIGH (ref 70–99)

## 2018-12-02 NOTE — Plan of Care (Signed)
  Problem: RH SKIN INTEGRITY Goal: RH STG SKIN FREE OF INFECTION/BREAKDOWN Description Free of skin breakdown and infection with mod I assist  Outcome: Progressing Goal: RH STG ABLE TO PERFORM INCISION/WOUND CARE W/ASSISTANCE Description STG Able To Perform Incision/Wound Care With Assistance. Mod I   Outcome: Progressing   Problem: RH SAFETY Goal: RH STG ADHERE TO SAFETY PRECAUTIONS W/ASSISTANCE/DEVICE Description STG Adhere to Safety Precautions With Assistance/Device. Mod I  Outcome: Progressing   Problem: RH PAIN MANAGEMENT Goal: RH STG PAIN MANAGED AT OR BELOW PT'S PAIN GOAL Description Less than 3  Outcome: Progressing   Problem: RH KNOWLEDGE DEFICIT GENERAL Goal: RH STG INCREASE KNOWLEDGE OF SELF CARE AFTER HOSPITALIZATION Description Patient will be able to describe medication management and wound management with mod I assistance  Outcome: Progressing

## 2018-12-02 NOTE — Progress Notes (Signed)
Social Work Social Work Assessment and Plan  Patient Details  Name: Amy Shepard MRN: [id number] Date of Birth: April 29, 1946  Today's Date: 12/02/2018  Problem List:  Patient Active Problem List   Diagnosis Date Noted  . Trimalleolar fracture of ankle, closed, right, sequela 12/01/2018  . Hypoxia   . Acute on chronic anemia   . Closed dislocation of right talus   . Acute on chronic postoperative respiratory failure ([place])   . Postprocedural hypotension   . Trimalleolar fracture of ankle, closed, right, initial encounter   . Syndesmotic disruption of ankle, right, initial encounter   . Ankle fracture 11/22/2018  . Pain in joint, ankle and foot 04/10/2018  . Closed left fibular fracture 04/10/2018  . CKD (chronic kidney disease), stage IV ([place]) 04/09/2018  . Near syncope 04/09/2018  . Dyspnea   . Bronchitis, acute 03/11/2018  . Fatigue 03/11/2018  . Anemia due to stage 4 chronic kidney disease ([place]) 03/11/2018  . Pulmonary fibrosis ([place]) 03/11/2018  . Pulmonary HTN ([place]) 03/11/2018  . Anemia 03/11/2018  . Lisfranc dislocation, left, subsequent encounter 06/08/2017  . Chronic respiratory failure with hypoxia ([place]) 06/03/2017  . Falls 06/03/2017  . Hypokalemia 06/03/2017  . Sepsis secondary to UTI ([place]) 06/02/2017  . Dog bite 05/02/2017  . CHF exacerbation ([place]) 05/02/2017  . Acute diastolic CHF (congestive heart failure) ([place]) 05/02/2017  . Chronic diastolic CHF (congestive heart failure) ([place]) 04/02/2017  . Cor pulmonale ([place]) 04/02/2017  . Acute on chronic respiratory failure ([place]) 03/10/2017  . Chronic pain 03/10/2017  . Lactic acidosis 02/10/2017  . Acute respiratory failure with hypoxia ([place]) 02/09/2017  . Acute on chronic diastolic CHF (congestive heart failure) ([place]) 06/23/2016  . HTN (hypertension) 06/23/2016  . Type 2 diabetes mellitus with complication, with long-term current use of insulin ([place]) 03/11/2016  . Depression 03/11/2016  . Chronic obstructive  pulmonary disease ([place]) 03/10/2016  . S/P lumbar spinal fusion 02/22/2016  . Coronary artery disease involving native coronary artery 07/12/2014  . Pain in the chest   . Malignant neoplasm of lower lobe of right lung ([place]) 05/25/2014  . Lung cancer ([place]) 09/01/2012   Past Medical History:  Past Medical History:  Diagnosis Date  . Arthritis   . CAD (coronary artery disease)    a. Prior cath 2015 showed 40% prox AD, 50-50% mLAD, otherwise calcification but no obstruction in LCx/RCA.. Medical management recommended. b. 2017: low-risk NST.  [name] Chronic diastolic CHF (congestive heart failure) ([place])   . Chronic respiratory failure ([place])   . CKD (chronic kidney disease), stage III ([place])   . COPD (chronic obstructive pulmonary disease) ([place])   . Cor pulmonale ([name])    a. felt due to advanced COPD and noncompliance with O2.  . Depression   . Diabetes mellitus    [name]    dx  2008  . Fracture    left foot  . Hx of seasonal allergies   . Hypercholesteremia   . Hypertension    on no medications  . Lung cancer ([place])   . On home oxygen therapy    "2.5L; 24/7" (03/10/2017)  . Pericardial effusion    a. small-moderate in 07/2016.  . Pulmonary hypertension ([place])   . UTI (urinary tract infection)    Past Surgical History:  Past Surgical History:  Procedure Laterality Date  . APPLICATION OF WOUND VAC Right 11/23/2018   Procedure: Application Of Wound Vac;  Surgeon: Newt Minion, MD;  Location: [facility];  Service: Orthopedics;  Laterality:  Right;  [name] [place] or [place] center in [place]     bilateral cataracts  . LEFT HEART CATHETERIZATION WITH CORONARY ANGIOGRAM N/A 07/12/2014   Procedure: LEFT HEART CATHETERIZATION WITH CORONARY ANGIOGRAM;  Surgeon: [name], MD;  Location: [facility]ATH LAB;  Service: Cardiovascular;  Laterality: N/A;  . MAXIMUM ACCESS (MAS)POSTERIOR LUMBAR INTERBODY FUSION (PLIF) 2 LEVEL N/A 02/22/2016   Procedure: Lumbar  three-four - Lumbar four-five  MAXIMUM ACCESS SURGERY  POSTERIOR LUMBAR INTERBODY FUSION;  Surgeon: [name], MD;  Location: [facility] NEURO ORS;  Service: Neurosurgery;  Laterality: N/A;  . ORIF ANKLE FRACTURE Right 11/23/2018   Procedure: OPEN REDUCTION INTERNAL FIXATION (ORIF) RIGHT ANKLE FRACTURE;  Surgeon: Newt Minion, MD;  Location: [facility];  Service: Orthopedics;  Laterality: Right;  . ORIF TOE FRACTURE Left 06/12/2017   Procedure: OPEN REDUCTION INTERNAL FIXATION (ORIF) BASE 1ST METATARSAL (TOE) FRACTURE;  Surgeon: Newt Minion, MD;  Location: [facility];  Service: Orthopedics;  Laterality: Left;  . RIGHT HEART CATH N/A 08/06/2017   Procedure: RIGHT HEART CATH;  Surgeon: Larey Dresser, MD;  Location: [facility] CV LAB;  Service: Cardiovascular;  Laterality: N/A;  . THORACOTOMY Right 2010   lower  . TONSILLECTOMY     Social History:  reports that she quit smoking about 12 years ago. She has a 30.00 pack-year smoking history. She has never used smokeless tobacco. She reports that she does not drink alcohol or use drugs.  Family / Support Systems Marital Status: Divorced Patient Roles: Parent Children: Ragna Gardner-daughter [phone] Other Supports: Local son Anticipated Caregiver: Self Ability/Limitations of Caregiver: Pt has not seen daughter since Christmas and probably will not when home. Son has not come over either due to [place] concerns. Caregiver Availability: Intermittent(Or none at all) Family Dynamics: Pt voiced her children are not supportive or involved, they do not come by and see her. She is virtually on her own and was doing well until this. She plans to get mod/i and return home if possible.  Social History Preferred language: English Religion: Catholic Cultural Background: No issues Education: HS Read: Yes Write: Yes Employment Status: Retired Public relations account executive Issues: No issues Guardian/Conservator: None-according to MD pt is capable of [employer] her  own decisions while here   Abuse/Neglect Abuse/Neglect Assessment Can Be Completed: Yes Physical Abuse: Denies Verbal Abuse: Denies Sexual Abuse: Denies Exploitation of patient/patient's resources: Denies Self-Neglect: Denies  Emotional Status Pt's affect, behavior and adjustment status: Pt is motivated to assist herself and has done well today in her therapies. She has always had to do for herself and could not depend upon other people. She prides herself on her self sufficiency Recent Psychosocial Issues: other health issues-COPD requiring O2 at home Psychiatric History: No history deferred depression screen due to adjusting to the new unit, but do feel she would benefit from seeing neuro-psych while here. Will make referral. Substance Abuse History: No issues  Patient / Family Perceptions, Expectations & Goals Pt/Family understanding of illness & functional limitations: Pt is able to explain her fracture and NWB. She does talk with the MD daily and feels she can get to mod/i wheelchair level to return home. She has been to a NH and can go back if needed. Premorbid pt/family roles/activities: Retiree, Mom, grandmother, etc Anticipated changes in roles/activities/participation: resume Pt/family expectations/goals: Pt states: " I will do what I need to do to be independent and to  take care of myself." Pt did not want worker to call children  [employer]: Other (Comment)(been to [facility] in the past) Premorbid Home Care/DME Agencies: Other (Comment)(Adapt-home O2 and has rollator and tub seat) Transportation available at discharge: Self pt drove prior to admission Resource referrals recommended: Neuropsychology  Discharge Planning Living Arrangements: Alone Support Systems: Other (Comment)(Basically on her own) Type of Residence: Private residence Insurance Resources: Multimedia programmer (specify)(UHC_Medicare) Financial Resources: [place] Referred: No Living Expenses: Rent Does the patient have any problems obtaining your medications?: No Home Management: Pt Patient/Family Preliminary Plans: Hopes to return to her handicapped apartment and can be independent wheelchair level. She will need some home management and transportation assist, unitl can drive again. Will await therapist evaluations and work on discharge needs. [place] Barriers to Discharge: Decreased caregiver support, Lack of/limited family support [place] Barriers to Discharge Comments: Will not have assist at [place] Anticipated Follow Up Needs: HH/OP, Support Group  Clinical Impression Pleasant female who has been through this before with her other ankle, last year. She did well at [facility] then went home alone. She hopes to go directly home from here but will need to be mod/i wheelchair level to be safe there alone. She has very limited support via her two children. Will work on discharge needs and await therapy evaluations.  [name] 12/02/2018, 1:34 PM

## 2018-12-02 NOTE — Evaluation (Signed)
Occupational Therapy Assessment and Plan  Patient Details  Name: Amy Shepard MRN: [id number] Date of Birth: 1946-04-02  OT Diagnosis: abnormal posture and muscle weakness (generalized) Rehab Potential: Rehab Potential (ACUTE ONLY): Good ELOS: 2 weeks   Today's Date: 12/02/2018 OT Individual Time: 0700-0800 and 1315-1343 OT Individual Time Calculation (min): 60 min   And 27 min   Problem List:  Patient Active Problem List   Diagnosis Date Noted  . Trimalleolar fracture of ankle, closed, right, sequela 12/01/2018  . Hypoxia   . Acute on chronic anemia   . Closed dislocation of right talus   . Acute on chronic postoperative respiratory failure ([place])   . Postprocedural hypotension   . Trimalleolar fracture of ankle, closed, right, initial encounter   . Syndesmotic disruption of ankle, right, initial encounter   . Ankle fracture 11/22/2018  . Pain in joint, ankle and foot 04/10/2018  . Closed left fibular fracture 04/10/2018  . CKD (chronic kidney disease), stage IV ([place]) 04/09/2018  . Near syncope 04/09/2018  . Dyspnea   . Bronchitis, acute 03/11/2018  . Fatigue 03/11/2018  . Anemia due to stage 4 chronic kidney disease ([place]) 03/11/2018  . Pulmonary fibrosis ([place]) 03/11/2018  . Pulmonary HTN ([place]) 03/11/2018  . Anemia 03/11/2018  . Lisfranc dislocation, left, subsequent encounter 06/08/2017  . Chronic respiratory failure with hypoxia ([place]) 06/03/2017  . Falls 06/03/2017  . Hypokalemia 06/03/2017  . Sepsis secondary to UTI ([place]) 06/02/2017  . Dog bite 05/02/2017  . CHF exacerbation ([place]) 05/02/2017  . Acute diastolic CHF (congestive heart failure) ([place]) 05/02/2017  . Chronic diastolic CHF (congestive heart failure) ([place]) 04/02/2017  . Cor pulmonale ([place]) 04/02/2017  . Acute on chronic respiratory failure ([place]) 03/10/2017  . Chronic pain 03/10/2017  . Lactic acidosis 02/10/2017  . Acute respiratory failure with hypoxia ([place]) 02/09/2017  . Acute on chronic diastolic  CHF (congestive heart failure) ([place]) 06/23/2016  . HTN (hypertension) 06/23/2016  . Type 2 diabetes mellitus with complication, with long-term current use of insulin ([place]) 03/11/2016  . Depression 03/11/2016  . Chronic obstructive pulmonary disease ([place]) 03/10/2016  . S/P lumbar spinal fusion 02/22/2016  . Coronary artery disease involving native coronary artery 07/12/2014  . Pain in the chest   . Malignant neoplasm of lower lobe of right lung ([place]) 05/25/2014  . Lung cancer ([place]) 09/01/2012    Past Medical History:  Past Medical History:  Diagnosis Date  . Arthritis   . CAD (coronary artery disease)    a. Prior cath 2015 showed 40% prox AD, 50-50% mLAD, otherwise calcification but no obstruction in LCx/RCA.. Medical management recommended. b. 2017: low-risk NST.  [name] Chronic diastolic CHF (congestive heart failure) ([place])   . Chronic respiratory failure ([place])   . CKD (chronic kidney disease), stage III ([place])   . COPD (chronic obstructive pulmonary disease) ([place])   . Cor pulmonale ([name])    a. felt due to advanced COPD and noncompliance with O2.  . Depression   . Diabetes mellitus    [name]    dx  2008  . Fracture    left foot  . Hx of seasonal allergies   . Hypercholesteremia   . Hypertension    on no medications  . Lung cancer ([place])   . On home oxygen therapy    "2.5L; 24/7" (03/10/2017)  . Pericardial effusion    a. small-moderate in 07/2016.  . Pulmonary hypertension ([place])   . UTI (urinary tract infection)    Past Surgical History:  Past Surgical History:  Procedure Laterality Date  . APPLICATION OF WOUND VAC Right 11/23/2018   Procedure: Application Of Wound Vac;  Surgeon: Newt Minion, MD;  Location: [facility];  Service: Orthopedics;  Laterality: Right;  . [place] or [place] center in [place]     bilateral cataracts  . LEFT HEART CATHETERIZATION WITH CORONARY ANGIOGRAM N/A 07/12/2014   Procedure: LEFT HEART CATHETERIZATION  WITH CORONARY ANGIOGRAM;  Surgeon: [name], MD;  Location: [facility] CATH LAB;  Service: Cardiovascular;  Laterality: N/A;  . MAXIMUM ACCESS (MAS)POSTERIOR LUMBAR INTERBODY FUSION (PLIF) 2 LEVEL N/A 02/22/2016   Procedure: Lumbar three-four - Lumbar four-five  MAXIMUM ACCESS SURGERY  POSTERIOR LUMBAR INTERBODY FUSION;  Surgeon: [name], MD;  Location: [facility] NEURO ORS;  Service: Neurosurgery;  Laterality: N/A;  . ORIF ANKLE FRACTURE Right 11/23/2018   Procedure: OPEN REDUCTION INTERNAL FIXATION (ORIF) RIGHT ANKLE FRACTURE;  Surgeon: Newt Minion, MD;  Location: [facility];  Service: Orthopedics;  Laterality: Right;  . ORIF TOE FRACTURE Left 06/12/2017   Procedure: OPEN REDUCTION INTERNAL FIXATION (ORIF) BASE 1ST METATARSAL (TOE) FRACTURE;  Surgeon: Newt Minion, MD;  Location: [facility];  Service: Orthopedics;  Laterality: Left;  . RIGHT HEART CATH N/A 08/06/2017   Procedure: RIGHT HEART CATH;  Surgeon: Larey Dresser, MD;  Location: [facility] CV LAB;  Service: Cardiovascular;  Laterality: N/A;  . THORACOTOMY Right 2010   lower  . TONSILLECTOMY      Assessment & Plan Clinical Impression: Patient is a 73 y.o. year old female with history of CAD, CKD, COPD with chronic hypoxic/hypercapnic respiratory failure- 4 L oxygen dependent, lung cancer, Q4ON, chronic systolic CHF with pulmonary HTN, gait disorder who sustained a fall due to weakness, twisted her right ankle and had sudden onset of severe pain.  History taken from chart review and patient.  She was found to have trimalleolar right ankle fracture and underwent ORIF of right ankle on 11/23/2018 by Dr. Sharol Given. To be nonweightbearing X 3 weeks with incisional VAC X one week.   Post op course complicated by hypotension and somnolence requiring pressors. PCCM consulted and patient treated with IV lasix, BIPAP and midodrine resumed as well as BDs as symptoms felt to be multifactorial due to anemia, fluid overload in setting of cor pulmonale, sleep  related hypoventilation. She was found to have ABLA and was transfused 2 units PRBCs. Opsumit and [place] resumed,  off BIPAP but has required HF oxygen now down to 6- 7L per [place] due to increased oxygen needs--pulmonary recommendations  keeping SpO2 90-92%. Therapy ongoing and patient continues to be limited by weakness, fatigue and NWB status affecting mobility and ADLs. CIR recommended due to functional decline.  .  Patient transferred to CIR on 12/01/2018 .    Patient currently requires max with basic self-care skills secondary to muscle weakness, decreased cardiorespiratoy endurance and decreased oxygen support, decreased ability to maintain NWB precautions and decreased sitting balance and decreased balance strategies.  Prior to hospitalization, patient could complete ADLs and IADLs with modified independent .  Patient will benefit from skilled intervention to increase independence with basic self-care skills prior to discharge home independently.  Anticipate patient will require no assistance and follow up home health.  OT - End of Session Activity Tolerance: Decreased this session Endurance Deficit: Yes Endurance Deficit Description: 4 L O2 vis [place] - multiple rest breaks secondary to fatigue OT Assessment  Rehab Potential (ACUTE ONLY): Good OT Barriers to Discharge: Decreased caregiver support OT Barriers to Discharge Comments: lives alone OT Patient demonstrates impairments in the following area(s): Balance;Cognition;Endurance;Motor;Safety OT Basic ADL's Functional Problem(s): Grooming;Bathing;Dressing;Toileting OT Advanced ADL's Functional Problem(s): Simple Meal Preparation;Laundry OT Transfers Functional Problem(s): Toilet;Tub/Shower OT Additional Impairment(s): None OT Plan OT Intensity: Minimum of 1-2 x/day, 45 to 90 minutes OT Frequency: 5 out of 7 days OT Duration/Estimated Length of Stay: 2 weeks OT Treatment/Interventions: Balance/vestibular training;Disease  mangement/prevention;Neuromuscular re-education;Self Care/advanced ADL retraining;Therapeutic Exercise;DME/adaptive equipment instruction;Pain management;UE/LE Strength taining/ROM;Community reintegration;Patient/family education;Splinting/orthotics;UE/LE Coordination activities;Discharge planning;Functional mobility training;Psychosocial support;Therapeutic Activities OT Self Feeding Anticipated Outcome(s): n/a OT Basic Self-Care Anticipated Outcome(s): mod I wheelchair level OT Toileting Anticipated Outcome(s): mod I wheelchair level OT Bathroom Transfers Anticipated Outcome(s): mod I wheelchair level OT Recommendation Recommendations for Other Services: Neuropsych consult;Therapeutic Recreation consult Therapeutic Recreation Interventions: Outing/community reintergration Patient destination: Home Follow Up Recommendations: Home health OT Equipment Recommended: To be determined   Skilled Therapeutic Intervention Session 1: Upon entering the room, pt sleeping soundly but agreeable to OT intervention. Pt with no c/o pain this session. OT applying CAM boot while supine with total A. Mod A sit >supine for trunk and R LE. Pt verbalized need for toileting. Stand pivot transfer with RW with mod lifting assistance but pt unable to maintain NWB status. Pt seated on commode chair and engaged in bathing and dressing with min A to button shirt secondary to arthritis. Pt needing assistance to thread clothing onto B feet and assist needed to pull over B hips. Pt returning back to bed with therapist placing foot under R LE and pt unable to maintain. OT discussed likely need for slide board transfers in order to maintain precautions. OT educated pt on OT purpose, POC, and goals with pt verbalizing understanding and agreement. Pt remained supine in bed with call bell and all needed items within reach upon exiting the room.  Session 2: Upon entering the room, pt supine in bed with c/o headache pain. RN notified for  medication. Pt reporting this is often a side effect of a current medication. Pt agreeable to B UE strengthening with use of therabands but once starting exercise pt requesting toileting again. Min A to EOB. Slide board transfer with mod A bed > drop arm commode chair. NT present and remained in room with pt for toileting needs.   OT Evaluation Precautions/Restrictions  Precautions Precautions: Fall Required Braces or Orthoses: Other Brace Other Brace: R CAM boot Restrictions Weight Bearing Restrictions: Yes RLE Weight Bearing: Non weight bearing Vital Signs Therapy Vitals Temp: 98.4 F (36.9 C) Temp Source: Oral Pulse Rate: 76 Resp: 20 BP: 96/84 Patient Position (if appropriate): Sitting Oxygen Therapy SpO2: 91 % O2 Device: High Flow Nasal Cannula O2 Flow Rate (L/min): 6 L/min Pain Pain Assessment Pain Scale: 0-10 Pain Score: 6  Pain Type: Acute pain Pain Location: Head Pain Descriptors / Indicators: Headache Pain Onset: Gradual Patients Stated Pain Goal: 2 Pain Intervention(s): RN made aware Home Living/Prior [place] expects to be discharged to:: Private residence Living Arrangements: Alone Available Help at Discharge: Family, Available PRN/intermittently Type of Home: Apartment Home Access: Level entry Home Layout: One level Bathroom Shower/Tub: Chiropodist: Handicapped height Bathroom Accessibility: Yes Additional Comments: Pt reports she has a w/c accessible apt   Lives With: Alone Prior Function Level of Independence: Independent with basic ADLs, Requires assistive device for independence  Able to Take Stairs?: No Vocation: Retired Comments: Heritage manager; uses Radiation protection practitioner, independent  with ADL's Vision Baseline Vision/History: Wears glasses Wears Glasses: Reading only Patient Visual Report: No change from baseline Cognition Overall Cognitive Status: Within Functional Limits for tasks assessed Arousal/Alertness:  Awake/alert Orientation Level: Person;Place;Situation Person: Oriented Place: Oriented Situation: Oriented Year: 2020 Month: May Day of Week: Correct Memory: Appears intact Immediate Memory Recall: Sock;Blue;Bed Memory Recall: Sock;Blue;Bed Memory Recall Sock: Without Cue Memory Recall Blue: Without Cue Memory Recall Bed: Without Cue Sensation Sensation Light Touch: Impaired by gross assessment Hot/Cold: Not tested Proprioception: Appears Intact Stereognosis: Not tested Coordination Gross Motor Movements are Fluid and Coordinated: Yes Fine Motor Movements are Fluid and Coordinated: No Motor  Motor Motor - Skilled Clinical Observations: generalized weakness Mobility  Transfers Sit to Stand: Moderate Assistance - Patient 50-74% Stand to Sit: Moderate Assistance - Patient 50-74%  Trunk/Postural Assessment  Cervical Assessment Cervical Assessment: Exceptions to WFL(forward head) Thoracic Assessment Thoracic Assessment: Exceptions to WFL(kyphotic) Lumbar Assessment Lumbar Assessment: Exceptions to WFL(posterior pelvic tilt) Postural Control Postural Control: Deficits on evaluation  Balance Balance Balance Assessed: Yes Dynamic Sitting Balance Dynamic Sitting - Balance Support: During functional activity Dynamic Sitting - Level of Assistance: 5: Stand by assistance Dynamic Standing Balance Dynamic Standing - Comments: unable to maintain NWB while in standing Extremity/Trunk Assessment RUE Assessment RUE Assessment: Within Functional Limits LUE Assessment LUE Assessment: Within Functional Limits     Refer to Care Plan for Long Term Goals  Recommendations for other services: Neuropsych and Therapeutic Recreation  Kitchen group and Outing/community reintegration   Discharge Criteria: Patient will be discharged from OT if patient refuses treatment 3 consecutive times without medical reason, if treatment goals not met, if there is a change in medical status, if  patient makes no progress towards goals or if patient is discharged from hospital.  The above assessment, treatment plan, treatment alternatives and goals were discussed and mutually agreed upon: by patient  Gypsy Decant 12/02/2018, 4:54 PM

## 2018-12-02 NOTE — Evaluation (Addendum)
Physical Therapy Assessment and Plan  Patient Details  Name: Amy Shepard MRN: [id number] Date of Birth: March 27, 1946  PT Diagnosis: Difficulty walking, Muscle weakness and Pain in joint Rehab Potential: Fair ELOS: 10-14 days    Today's Date: 12/02/2018 PT Individual Time: 8341-9622 PT Individual Time Calculation (min): 70 min    Problem List:  Patient Active Problem List   Diagnosis Date Noted  . Trimalleolar fracture of ankle, closed, right, sequela 12/01/2018  . Hypoxia   . Acute on chronic anemia   . Closed dislocation of right talus   . Acute on chronic postoperative respiratory failure ([place])   . Postprocedural hypotension   . Trimalleolar fracture of ankle, closed, right, initial encounter   . Syndesmotic disruption of ankle, right, initial encounter   . Ankle fracture 11/22/2018  . Pain in joint, ankle and foot 04/10/2018  . Closed left fibular fracture 04/10/2018  . CKD (chronic kidney disease), stage IV ([place]) 04/09/2018  . Near syncope 04/09/2018  . Dyspnea   . Bronchitis, acute 03/11/2018  . Fatigue 03/11/2018  . Anemia due to stage 4 chronic kidney disease ([place]) 03/11/2018  . Pulmonary fibrosis ([place]) 03/11/2018  . Pulmonary HTN ([place]) 03/11/2018  . Anemia 03/11/2018  . Lisfranc dislocation, left, subsequent encounter 06/08/2017  . Chronic respiratory failure with hypoxia ([place]) 06/03/2017  . Falls 06/03/2017  . Hypokalemia 06/03/2017  . Sepsis secondary to UTI ([place]) 06/02/2017  . Dog bite 05/02/2017  . CHF exacerbation ([place]) 05/02/2017  . Acute diastolic CHF (congestive heart failure) ([place]) 05/02/2017  . Chronic diastolic CHF (congestive heart failure) ([place]) 04/02/2017  . Cor pulmonale ([place]) 04/02/2017  . Acute on chronic respiratory failure ([place]) 03/10/2017  . Chronic pain 03/10/2017  . Lactic acidosis 02/10/2017  . Acute respiratory failure with hypoxia ([place]) 02/09/2017  . Acute on chronic diastolic CHF (congestive heart failure) ([place]) 06/23/2016  .  HTN (hypertension) 06/23/2016  . Type 2 diabetes mellitus with complication, with long-term current use of insulin ([place]) 03/11/2016  . Depression 03/11/2016  . Chronic obstructive pulmonary disease ([place]) 03/10/2016  . S/P lumbar spinal fusion 02/22/2016  . Coronary artery disease involving native coronary artery 07/12/2014  . Pain in the chest   . Malignant neoplasm of lower lobe of right lung ([place]) 05/25/2014  . Lung cancer ([place]) 09/01/2012    Past Medical History:  Past Medical History:  Diagnosis Date  . Arthritis   . CAD (coronary artery disease)    a. Prior cath 2015 showed 40% prox AD, 50-50% mLAD, otherwise calcification but no obstruction in LCx/RCA.. Medical management recommended. b. 2017: low-risk NST.  [name] Chronic diastolic CHF (congestive heart failure) ([place])   . Chronic respiratory failure ([place])   . CKD (chronic kidney disease), stage III ([place])   . COPD (chronic obstructive pulmonary disease) ([place])   . Cor pulmonale ([name])    a. felt due to advanced COPD and noncompliance with O2.  . Depression   . Diabetes mellitus    [name]    dx  2008  . Fracture    left foot  . Hx of seasonal allergies   . Hypercholesteremia   . Hypertension    on no medications  . Lung cancer ([place])   . On home oxygen therapy    "2.5L; 24/7" (03/10/2017)  . Pericardial effusion    a. small-moderate in 07/2016.  . Pulmonary hypertension ([place])   . UTI (urinary tract infection)    Past Surgical History:  Past Surgical History:  Procedure Laterality  Date  . APPLICATION OF WOUND VAC Right 11/23/2018   Procedure: Application Of Wound Vac;  Surgeon: Newt Minion, MD;  Location: [facility];  Service: Orthopedics;  Laterality: Right;  . [facility] or [place] center in [place]     bilateral cataracts  . LEFT HEART CATHETERIZATION WITH CORONARY ANGIOGRAM N/A 07/12/2014   Procedure: LEFT HEART CATHETERIZATION WITH CORONARY ANGIOGRAM;  Surgeon: [name], MD;  Location: [facility] CATH LAB;  Service: Cardiovascular;  Laterality: N/A;  . MAXIMUM ACCESS (MAS)POSTERIOR LUMBAR INTERBODY FUSION (PLIF) 2 LEVEL N/A 02/22/2016   Procedure: Lumbar three-four - Lumbar four-five  MAXIMUM ACCESS SURGERY  POSTERIOR LUMBAR INTERBODY FUSION;  Surgeon: [name], MD;  Location: [facility] NEURO ORS;  Service: Neurosurgery;  Laterality: N/A;  . ORIF ANKLE FRACTURE Right 11/23/2018   Procedure: OPEN REDUCTION INTERNAL FIXATION (ORIF) RIGHT ANKLE FRACTURE;  Surgeon: Newt Minion, MD;  Location: [facility];  Service: Orthopedics;  Laterality: Right;  . ORIF TOE FRACTURE Left 06/12/2017   Procedure: OPEN REDUCTION INTERNAL FIXATION (ORIF) BASE 1ST METATARSAL (TOE) FRACTURE;  Surgeon: Newt Minion, MD;  Location: [facility];  Service: Orthopedics;  Laterality: Left;  . RIGHT HEART CATH N/A 08/06/2017   Procedure: RIGHT HEART CATH;  Surgeon: Larey Dresser, MD;  Location: [facility] CV LAB;  Service: Cardiovascular;  Laterality: N/A;  . THORACOTOMY Right 2010   lower  . TONSILLECTOMY      Assessment & Plan Clinical Impression: Patient is a 73 year old female with history of CAD, CKD, COPD with chronic hypoxic/hypercapnic respiratory failure- 4 L oxygen dependent, lung cancer, K2HC, chronic systolic CHF with pulmonary HTN, gait disorder who sustained a fall due to weakness, twisted her right ankle and had sudden onset of severe pain.  History taken from chart review and patient.  She was found to have trimalleolar right ankle fracture and underwent ORIF of right ankle on 11/23/2018 by Dr. Sharol Given. To be nonweightbearing X 3 weeks with incisional VAC X one week.   Post op course complicated by hypotension and somnolence requiring pressors. PCCM consulted and patient treated with IV lasix, BIPAP and midodrine resumed as well as BDs as symptoms felt to be multifactorial due to anemia, fluid overload in setting of cor pulmonale, sleep related hypoventilation. She was found to have ABLA and was  transfused 2 units PRBCs. Opsumit and Selex[place] resumed,  off BIPAP but has required HF oxygen now down to 6- 7L per [place] due to increased oxygen needs--pulmonary recommendations  keeping SpO2 90-92%. Therapy ongoing and patient continues to be limited by weakness, fatigue and NWB status affecting mobility and ADLs.  Patient transferred to CIR on 12/01/2018 .   Patient currently requires mod with mobility secondary to muscle weakness and muscle joint tightness, decreased cardiorespiratoy endurance and decreased oxygen support and decreased sitting balance, decreased standing balance, decreased postural control, decreased balance strategies and difficulty maintaining precautions.  Prior to hospitalization, patient was independent  with mobility and lived with   in a [facility] home.  Home access is  Level entry.  Patient will benefit from skilled PT intervention to maximize safe functional mobility, minimize fall risk and decrease caregiver burden for planned discharge home alone.  Anticipate patient will benefit from follow up [place] at discharge.  PT - End of Session Endurance Deficit: Yes Endurance Deficit Description: 4 L O2 vis [place] - multiple rest breaks secondary to fatigue PT  Assessment Rehab Potential (ACUTE/IP ONLY): Fair PT Barriers to Discharge: Decreased caregiver support;Medical stability;Weight bearing restrictions;Insurance for SNF coverage PT Patient demonstrates impairments in the following area(s): Balance;Edema;Safety;Sensory;Skin Integrity PT Transfers Functional Problem(s): Bed to Chair;Car;Furniture;Floor;Bed Mobility PT Locomotion Functional Problem(s): Ambulation PT Plan PT Intensity: Minimum of 1-2 x/day ,45 to 90 minutes PT Frequency: 5 out of 7 days PT Duration Estimated Length of Stay: 10-14 days  PT Treatment/Interventions: Ambulation/gait training;Cognitive remediation/compensation;Discharge planning;DME/adaptive equipment instruction;Functional mobility training;Pain  management;Psychosocial support;Splinting/orthotics;Therapeutic Activities;UE/LE Strength taining/ROM;Visual/perceptual remediation/compensation;Wheelchair propulsion/positioning;UE/LE Coordination activities;Therapeutic Exercise;Stair training;Skin care/wound management;Patient/family education;Neuromuscular re-education;Disease management/prevention;Community reintegration;Balance/vestibular training PT Transfers Anticipated Outcome(s): Mod I with LRAD  PT Locomotion Anticipated Outcome(s): Mod at [facility] level  PT Recommendation Recommendations for Other Services: Neuropsych consult;Therapeutic Recreation consult Therapeutic Recreation Interventions: Stress management Follow Up Recommendations: Home health PT Patient destination: Home Equipment Recommended: Wheelchair (measurements);Wheelchair cushion (measurements);Sliding board;Rolling walker with 5" wheels  Skilled Therapeutic Intervention PT instructed patient in PT Evaluation and initiated treatment intervention; see below for results. PT educated patient in [place], rehab potential, rehab goals, and discharge recommendations. Pt performed sit<>stand and attempted gait training x 2 steps with mod assist to prevent WB. WC mobility as listed below. Pt returned to room and performed SB transfer to bed with min-mod assist for safet. Sit>supine completed with supervision assist, and left supine in bed with call bell in reach and all needs met.     PT Evaluation Precautions/Restrictions    NWB RLE  General   Vital SignsOxygen Therapy SpO2: 90 % O2 Device: High Flow Nasal Cannula O2 Flow Rate (L/min): 6 L/min Pain   6/10 Neck. RN made aware Home Living/Prior Functioning Home Living Available Help at Discharge: Family;Available PRN/intermittently Type of Home: Apartment Home Access: Level entry Home Layout: One level Bathroom Shower/Tub: Chiropodist: Handicapped height Bathroom Accessibility: Yes Additional Comments: Pt  reports she has a w/c accessible apt  Prior Function Level of Independence: Independent with basic ADLs;Requires assistive device for independence(Rollator)  Able to Take Stairs?: No Vocation: Retired Comments: Heritage manager; Engineer, manufacturing systems, independent with ADL's Vision/Perception     WFL Cognition Overall Cognitive Status: Within Functional Limits for tasks assessed Orientation Level: Oriented X4 Sensation Sensation Light Touch: Impaired by gross assessment(reports mild neuropathy at baseline. able to detect all stimuli) Proprioception: Appears Intact Coordination Gross Motor Movements are Fluid and Coordinated: Yes Fine Motor Movements are Fluid and Coordinated: Yes Motor  Motor Motor - Skilled Clinical Observations: generalized weakness  Mobility Bed Mobility Bed Mobility: Rolling Right;Rolling Left;Supine to Sit;Sit to Supine Rolling Right: Supervision/verbal cueing Rolling Left: Supervision/Verbal cueing Supine to Sit: Contact Guard/Touching assist Sit to Supine: Contact Guard/Touching assist Transfers Transfers: Sit to Stand;Stand to Sit;Lateral/Scoot Transfers Sit to Stand: Moderate Assistance - Patient 50-74% Stand to Sit: Moderate Assistance - Patient 50-74% Lateral/Scoot Transfers: Minimal Assistance - Patient > 75% Transfer (Assistive device): Other (Comment)(RW - unable to maintain NWB precaution) Locomotion  Gait Ambulation: Yes Gait Assistance: Moderate Assistance - Patient 50-74% Gait Distance (Feet): 2 Feet Assistive device: Parallel bars Gait Assistance Details: Manual facilitation for placement;Manual facilitation for weight bearing;Verbal cues for precautions/safety;Verbal cues for gait pattern Stairs / Additional Locomotion Stairs: No Wheelchair Mobility Wheelchair Mobility: Yes Wheelchair Assistance: Chartered loss adjuster: Both upper extremities Wheelchair Parts Management: Needs assistance Distance: 75  Trunk/Postural  Assessment  Cervical Assessment Cervical Assessment: Exceptions to WFL(forward head) Thoracic Assessment Thoracic Assessment: Exceptions to WFL(kyphotic) Lumbar Assessment Lumbar Assessment: Exceptions to WFL(posterior pelvic tilt) Postural Control Postural Control: Deficits on evaluation  Balance  Balance Balance Assessed: Yes Dynamic Sitting Balance Dynamic Sitting - Balance Support: During functional activity Dynamic Sitting - Level of Assistance: 5: Stand by assistance Dynamic Standing Balance Dynamic Standing - Balance Support: Bilateral upper extremity supported Dynamic Standing - Level of Assistance: 3: Mod assist Dynamic Standing - Comments: unable to maintain NWB in standing Extremity Assessment      RLE Assessment RLE Assessment: Exceptions to [facility] General Strength Comments: grossly 4/5 proximal to distal with cam boot at ankle  LLE Assessment LLE Assessment: Within Functional Limits    Refer to Care Plan for Long Term Goals  Recommendations for other services: Neuropsych and Therapeutic Recreation  Stress management  Discharge Criteria: Patient will be discharged from PT if patient refuses treatment 3 consecutive times without medical reason, if treatment goals not met, if there is a change in medical status, if patient makes no progress towards goals or if patient is discharged from hospital.  The above assessment, treatment plan, treatment alternatives and goals were discussed and mutually agreed upon: by patient  Lorie Phenix 12/02/2018, 10:52 AM

## 2018-12-02 NOTE — Progress Notes (Signed)
Wound vac removed as ordered by charge nurse Lysle Morales . Site cleaned with normal saline and telfa and kerlix dressing applied sutures remain intact.

## 2018-12-02 NOTE — Progress Notes (Signed)
Inpatient Rehabilitation  Patient information reviewed and entered into eRehab system by [facility]. Loni Beckwith., CCC/SLP, PPS Coordinator.  Information including medical coding, functional ability and quality indicators will be reviewed and updated through discharge.

## 2018-12-02 NOTE — Care Management Note (Signed)
Inpatient [facility] Individual Statement of Services  Patient Name:  Amy Shepard  Date:  12/02/2018  Welcome to the [facility].  Our goal is to provide you with an individualized program based on your diagnosis and situation, designed to meet your specific needs.  With this comprehensive rehabilitation program, you will be expected to participate in at least 3 hours of rehabilitation therapies Monday-Friday, with modified therapy programming on the weekends.  Your rehabilitation program will include the following services:  Physical Therapy (PT), Occupational Therapy (OT), 24 hour per day rehabilitation nursing, Neuropsychology, Case Management (Social Worker), Rehabilitation Medicine, Nutrition Services and Pharmacy Services  Weekly team conferences will be held on Wednesday to discuss your progress.  Your Social Worker will talk with you frequently to get your input and to update you on team discussions.  Team conferences with you and your family in attendance may also be held.  Expected length of stay: 12-14 days  Overall anticipated outcome: independent with device  Depending on your progress and recovery, your program may change. Your Social Worker will coordinate services and will keep you informed of any changes. Your Social Worker's name and contact numbers are listed  below.  The following services may also be recommended but are not provided by the [facility] will be made to provide these services after discharge if needed.  Arrangements include referral to agencies that provide these services.  Your insurance has been verified to be:  [employer]-Medicare Your primary doctor is:  Maurice Small  Pertinent information will be shared with your doctor and your insurance company.  Social Worker:  Ovidio Kin, [place] or ([phone]  Information discussed with and copy given to patient by: [name], 12/02/2018, 1:37 PM

## 2018-12-02 NOTE — Progress Notes (Signed)
[facility] PHYSICAL MEDICINE & REHABILITATION PROGRESS NOTE   Subjective/Complaints:  reviewed labs Pt still with occ cough, mainly non productive Bac on meds for pulm hypertension   ROS- denies CP, SOB, N/V/D  Objective:   No results found. Recent Labs    12/01/18 0734 12/02/18 0416  WBC 6.9 7.1  HGB 8.1* 8.3*  HCT 26.9* 27.1*  PLT 240 216   Recent Labs    12/01/18 0734 12/02/18 0416  NA 138 139  K 4.1 4.2  CL 99 103  CO2 29 25  GLUCOSE 146* 174*  BUN 28* 32*  CREATININE 1.22* 1.39*  CALCIUM 9.4 9.1    Intake/Output Summary (Last 24 hours) at 12/02/2018 0758 Last data filed at 12/01/2018 1956 Gross per 24 hour  Intake 240 ml  Output 0 ml  Net 240 ml     Physical Exam: Vital Signs Blood pressure 129/62, pulse 79, temperature 97.9 F (36.6 C), temperature source Oral, resp. rate 16, height _0  (1.549 m), SpO2 95 %.   General: No acute distress Mood and affect are appropriate Heart: Regular rate and rhythm no rubs murmurs or extra sounds Lungs: Clear to auscultation, breathing unlabored, no rales or wheezes Abdomen: Positive bowel sounds, soft nontender to palpation, nondistended Extremities: No clubbing, cyanosis, or edema Skin: No evidence of breakdown, no evidence of rash Neurologic: Cranial nerves II through XII intact, motor strength is 4/5 in bilateral deltoid, bicep, tricep, grip, hip flexor, knee extensors,5/5 left, trace RIght  ankle dorsiflexor and plantar flexor Sensory exam normal sensation to light touch and proprioception in bilateral upper and lower extremities Cerebellar exam normal finger to nose to finger as well as heel to shin in bilateral upper and lower extremities Musculoskeletal: Full range of motion in all 4 extremities. No joint swelling   Assessment/Plan: 1. Functional deficits secondary to Right trimalleolar fracture which require 3+ hours per day of interdisciplinary therapy in a comprehensive inpatient rehab  setting.  Physiatrist is providing close team supervision and 24 hour management of active medical problems listed below.  Physiatrist and rehab team continue to assess barriers to discharge/monitor patient progress toward functional and medical goals  Care Tool:  Bathing              Bathing assist       Upper Body Dressing/Undressing Upper body dressing        Upper body assist      Lower Body Dressing/Undressing Lower body dressing            Lower body assist       Toileting Toileting    Toileting assist Assist for toileting: Minimal Assistance - Patient > 75%     Transfers Chair/bed transfer  Transfers assist           Locomotion Ambulation   Ambulation assist              Walk 10 feet activity   Assist           Walk 50 feet activity   Assist           Walk 150 feet activity   Assist           Walk 10 feet on uneven surface  activity   Assist           Wheelchair     Assist               Wheelchair 50 feet with 2 turns activity  Assist            Wheelchair 150 feet activity     Assist          Medical Problem List and Plan: 1.  Functional deficits secondary to right trimalleolar ankle fracture and acute on chronic respiratory failure.            CIR Evals 2.  Antithrombotics: -DVT/anticoagulation:  Pharmaceutical: Lovenox             -antiplatelet therapy: N/A. 3. Pain Management:  Controlled with prn tylenol and robaxin.             Monitor with increased mobility 4. Mood: LCSW to follow for evaluation and support.              -antipsychotic agents: N/A 5. Neuropsych: This patient is capable of making decisions on her own behalf. 6. Skin/Wound Care: Routine pressure relief measures. Will plan to remove wound VAC as 7+ days post op. 7. Fluids/Electrolytes/Nutrition: Monitor I/O.  BMP ordered for tomorrow a.m. 8. H/o Lung cancer/COPD- O2 dependent: Currently on 6L  HF oxygen--> weaned to 4L HF this afternoon.  Continue to wean supplemental oxygen as tolerated.  Continue Dulera bid with duonebs prn.  Resp virus testing (including SARS Covid 2 neg) ENc IS and Flutter valve 9. Chronic systolic CHF with pulmonary HTN: Monitor weights.  Continue Opsumit and Selexipag. On Lipitor. Currently stable on lower dose lasix.  10. T2DM;  Continue Levemir and titrate as needed. Off Trulicity at this time. Will add meal coverage as at home and use SSI for elevated BS. Continue to monitor BS ac/hs.  Monitor with increased mobility. CBG (last 3)  Recent Labs    12/01/18 1848 12/01/18 2112 12/02/18 0626  GLUCAP 199* 252* 131*  labile- monitor 11. CKD:  Continue to monitor renal status with serial checks. Has improved with SCr-1.22.  BMP ordered for tomorrow a.m. 12. Chronic hypotension: Monitor BP tid. On midodrine with BP improving.  Monitor with increased mobility. 13. Acute on chronic iron deficiency anemia: Baseline Hgb in 9 range. Has improved past transfusion. Add iron supplement. Will continue to monitor. CBC ordered for tomorrow.  14. Right trimalleolar ankle fracture: NWB X 3 weeks. CAM at all times    LOS: 1 days A FACE TO Maurice E Maham Quintin 12/02/2018, 7:58 AM

## 2018-12-03 ENCOUNTER — Inpatient Hospital Stay (HOSPITAL_COMMUNITY): Payer: Medicare Other | Admitting: Physical Therapy

## 2018-12-03 ENCOUNTER — Encounter (HOSPITAL_COMMUNITY): Payer: Medicare Other | Admitting: Cardiology

## 2018-12-03 ENCOUNTER — Inpatient Hospital Stay (HOSPITAL_COMMUNITY): Payer: Medicare Other | Admitting: Occupational Therapy

## 2018-12-03 LAB — GLUCOSE, CAPILLARY
Glucose-Capillary: 131 mg/dL — ABNORMAL HIGH (ref 70–99)
Glucose-Capillary: 167 mg/dL — ABNORMAL HIGH (ref 70–99)
Glucose-Capillary: 114 mg/dL — ABNORMAL HIGH (ref 70–99)
Glucose-Capillary: 276 mg/dL — ABNORMAL HIGH (ref 70–99)

## 2018-12-03 MED ORDER — SALINE SPRAY 0.65 % NA SOLN
1.0000 | NASAL | Status: DC | PRN
  Administered 2018-12-03: 09:00:00 1 via NASAL
  Filled 2018-12-03: qty 44

## 2018-12-03 MED ORDER — CITALOPRAM HYDROBROMIDE 10 MG PO TABS
10.0000 mg | ORAL_TABLET | Freq: Every day | ORAL | Status: DC
  Administered 2018-12-03 – 2018-12-14 (×12): 10 mg via ORAL
  Filled 2018-12-03 (×12): qty 1

## 2018-12-03 NOTE — Progress Notes (Signed)
RT found patient on 4 L, sats 80- 81%.  RT increased to 6L, sats improved to 92%.  Patient tolerating well at this time. RN notified.

## 2018-12-03 NOTE — Progress Notes (Signed)
Increased to 6 L due to sats at 81% on 4 L.  RN notified.

## 2018-12-03 NOTE — Progress Notes (Signed)
Occupational Therapy Session Note  Patient Details  Name: Amy Shepard MRN: [id number] Date of Birth: 1946-03-27  Today's Date: 12/03/2018 OT Individual Time: 1230-1315 OT Individual Time Calculation (min): 45 min   Short Term Goals: Week 1:  OT Short Term Goal 1 (Week 1): Pt will perform toileting with min A overall . OT Short Term Goal 2 (Week 1): Pt will perform toilet transfer with min A while maintaining NWB precautions.  OT Short Term Goal 3 (Week 1): Pt will perform LB dressing with min A overall to increase I with self care.  Skilled Therapeutic Interventions/Progress Updates:    Pt greeted EOB, finishing lunch and receiving afternoon medication from RN. Per pt, [name] This was decreased to 4L during session per 02 level at home. Squat pivot<w/c completed with Mod A and vcs for precaution adherence. Pt requested to have a rest break post transfer, and then afterwards she completed oral care/grooming tasks w/c level at sink. She required increased time for these tasks. 02 sats afterwards 88%, increasing to 93% when cued to focus on breathing. She asked OT who would teach her to breathe better, because per pt, no one had addressed this since admission. Therefore, guided her through diaphragmatic breathing exercises for remainder of session, with education emphasis placed on activating primary vs accessory breathing muscles. Discussed using mindful breathing for both anxiety and pain mgt. She verbalized that she has struggled with anxiety since CA dx in 2011. Provided emotional support during conversation, as she became teary. Pt completed 10 reps 2 sets of breathing exercises, incorporating visualization techniques for 2nd set with instruction. 02 sats after each set were 99-100% with pt reporting that she felt better and more relaxed. Educated pt to use breathing exercises before bedtime and when at rest in  between therapies. She verbalized understanding. At end of session pt was left in w/c with all needs within reach and chair alarm set. Tx focus placed on functional transfers, precaution adherence, activity tolerance, and maintaining stable vitals during functional tasks.   Therapy Documentation Precautions:  Precautions Precautions: Fall Required Braces or Orthoses: Other Brace Other Brace: R CAM boot Restrictions Weight Bearing Restrictions: (P) Yes RLE Weight Bearing: (P) Non weight bearing Vital Signs: Therapy Vitals Temp: 97.9 F (36.6 C) Temp Source: Oral Pulse Rate: 75 Resp: 20 BP: (!) 99/49 Patient Position (if appropriate): Sitting Oxygen Therapy SpO2: 97 % O2 Device: Nasal Cannula O2 Flow Rate (L/min): 4 L/min ADL:       Therapy/Group: Individual Therapy  [name] 12/03/2018, 2:55 PM

## 2018-12-03 NOTE — Progress Notes (Signed)
Physical Therapy Session Note  Patient Details  Name: Amy Shepard MRN: [id number] Date of Birth: 08/20/1945  Today's Date: 12/03/2018 PT Individual Time: 1000-1100 PT Individual Time Calculation (min): 60 min   Short Term Goals: Week 1:  PT Short Term Goal 1 (Week 1): Pt will perform bed mobility with supervision assist consistently  PT Short Term Goal 2 (Week 1): Pt will performed SB transfer wit supervision assist PT Short Term Goal 3 (Week 1): Pt will propell WC 188f with supervision assist and no rest breaks  PT Short Term Goal 4 (Week 1): Pt will perform sit<>stand with min assist and maintain NWB with LRAD   Skilled Therapeutic Interventions/Progress Updates:   Pt received sitting in WC and agreeable to PT; pt maintained on 6 L/min of supplemental O2 throughout treatment. [facility] instructed pt BLE therex: LAQ x 15, hip abduction with manual resistance, ankle PF/DF on the L, reciprocal marches, hip extension with manual resistance. Cues for full ROM and reduced speed with eccentrics.  WC mobility x 1044f+12064fith short rest break between bouts. SpO2 monitored at rest break >90%. Cues for improved shoulder ROM as tolerated to reduce energy expenditure.  Squat pivot transfer to mat table with min assist, significant improvement in NWB on this day. Sit<>stand x 5 from mat table with RW and min assist from PT. Pt able to appropriately maintain NWB. Stand [facility] and moderate cues for technique to improve safety and reduce fatigue. Pt returned to room and performed squat pivot transfer to bed with min assist and cues for LE position. Sit>supine completed with supervision assist, and left supine in bed with call bell in reach and all needs met.        Therapy Documentation Precautions:  Precautions Precautions: Fall Required Braces or Orthoses: Other Brace Other Brace: R CAM boot Restrictions Weight Bearing  Restrictions: Yes RLE Weight Bearing: Non weight bearing Vital Signs: Therapy Vitals Pulse Rate: 81 Resp: 18 Patient Position (if appropriate): Lying Oxygen Therapy SpO2: 92 % O2 Device: High Flow Nasal Cannula O2 Flow Rate (L/min): 6 L/min Pain:   denies    Therapy/Group: Individual Therapy  AusLorie Phenix22/2020, 12:21 PM

## 2018-12-03 NOTE — Progress Notes (Signed)
Patient wants her neurontin increased to 347m 3xdaily.  She takes that at home.  Will leave note for md

## 2018-12-03 NOTE — Progress Notes (Signed)
[place] PHYSICAL MEDICINE & REHABILITATION PROGRESS NOTE   Subjective/Complaints:   Asking "do I have psoriasis"  Notes "rash " on upper arms and back  Which has been present for 1.89yr.  Not pruritic or progressive, feels like she is sweating but no visible sweat  Also discussed that she prefers celexa to Effexor   ROS- denies CP, SOB, N/V/D  Objective:   No results found. Recent Labs    12/01/18 0734 12/02/18 0416  WBC 6.9 7.1  HGB 8.1* 8.3*  HCT 26.9* 27.1*  PLT 240 216   Recent Labs    12/01/18 0734 12/02/18 0416  NA 138 139  K 4.1 4.2  CL 99 103  CO2 29 25  GLUCOSE 146* 174*  BUN 28* 32*  CREATININE 1.22* 1.39*  CALCIUM 9.4 9.1    Intake/Output Summary (Last 24 hours) at 12/03/2018 0729 Last data filed at 12/02/2018 1845 Gross per 24 hour  Intake 480 ml  Output -  Net 480 ml     Physical Exam: Vital Signs Blood pressure (!) 109/57, pulse 73, temperature 97.7 F (36.5 C), temperature source Oral, resp. rate 16, height _0  (1.549 m), SpO2 96 %.   General: No acute distress Mood and affect are appropriate Heart: Regular rate and rhythm no rubs murmurs or extra sounds Lungs: Clear to auscultation, breathing unlabored, no rales or wheezes Abdomen: Positive bowel sounds, soft nontender to palpation, nondistended Extremities: No clubbing, cyanosis, or edema Skin: No evidence of breakdown, no evidence of rash, brownish scales 221mdiameter no erythema, RUE and upper back, very sparse Neurologic: Cranial nerves II through XII intact, motor strength is 4/5 in bilateral deltoid, bicep, tricep, grip, hip flexor, knee extensors,5/5 left, trace RIght  ankle dorsiflexor and plantar flexor Sensory exam normal sensation to light touch and proprioception in bilateral upper and lower extremities Cerebellar exam normal finger to nose to finger as well as heel to shin in bilateral upper and lower extremities Musculoskeletal: CAM boot RLE   Assessment/Plan: 1.  Functional deficits secondary to Right trimalleolar fracture which require 3+ hours per day of interdisciplinary therapy in a comprehensive inpatient rehab setting.  Physiatrist is providing close team supervision and 24 hour management of active medical problems listed below.  Physiatrist and rehab team continue to assess barriers to discharge/monitor patient progress toward functional and medical goals  Care Tool:  Bathing  Bathing activity did not occur: (seated on BSC) Body parts bathed by patient: Right arm, Left arm, Chest, Abdomen, Front perineal area, Right upper leg, Left upper leg, Face   Body parts bathed by helper: Buttocks, Right lower leg, Left lower leg     Bathing assist Assist Level: Moderate Assistance - Patient 50 - 74%     Upper Body Dressing/Undressing Upper body dressing   What is the patient wearing?: Button up shirt    Upper body assist Assist Level: Minimal Assistance - Patient > 75%    Lower Body Dressing/Undressing Lower body dressing      What is the patient wearing?: Pants     Lower body assist Assist for lower body dressing: Maximal Assistance - Patient 25 - 49%     Toileting Toileting    Toileting assist Assist for toileting: Moderate Assistance - Patient 50 - 74%     Transfers Chair/bed transfer  Transfers assist     Chair/bed transfer assist level: Moderate Assistance - Patient 50 - 74%     Locomotion Ambulation   Ambulation assist  Walk 10 feet activity   Assist           Walk 50 feet activity   Assist           Walk 150 feet activity   Assist           Walk 10 feet on uneven surface  activity   Assist           Wheelchair     Assist               Wheelchair 50 feet with 2 turns activity    Assist            Wheelchair 150 feet activity     Assist          Medical Problem List and Plan: 1.  Functional deficits secondary to right  trimalleolar ankle fracture and acute on chronic respiratory failure.            CIR PT, OT 2.  Antithrombotics: -DVT/anticoagulation:  Pharmaceutical: Lovenox             -antiplatelet therapy: N/A. 3. Pain Management:  Controlled with prn tylenol and robaxin.             Monitor with increased mobility 4. Mood: LCSW to follow for evaluation and support.              -antipsychotic agents: N/A Hx depression d/c Effexor, restart Celexa 5. Neuropsych: This patient is capable of making decisions on her own behalf. 6. Skin/Wound Care: Routine pressure relief measures. Skin area is not psoriasis, possible seborrhea, will f/u with derm as OP 7. Fluids/Electrolytes/Nutrition: Monitor I/O.  BMP ordered for tomorrow a.m. 8. H/o Lung cancer/COPD- O2 dependent: Currently on 6L HF oxygen--> weaned to 4L HF this afternoon.  Continue to wean supplemental oxygen as tolerated.  Continue Dulera bid with duonebs prn.  Resp virus testing (including SARS Covid 2 neg) ENc IS and Flutter valve 9. Chronic systolic CHF with pulmonary HTN: Monitor weights.  Continue Opsumit and Selexipag. On Lipitor. Currently stable on lower dose lasix.  10. T2DM;  Continue Levemir and titrate as needed. Off Trulicity at this time. Will add meal coverage as at home and use SSI for elevated BS. Continue to monitor BS ac/hs.  Monitor with increased mobility. CBG (last 3)  Recent Labs    12/02/18 1631 12/02/18 2105 12/03/18 0632  GLUCAP 108* 177* 131*  improved 5/22 11. CKD:  Continue to monitor renal status with serial checks. Has improved with SCr-1.22.  BMP ordered for tomorrow a.m. 12. Chronic hypotension: Monitor BP tid. On midodrine with BP improving.  Monitor with increased mobility. 13. Acute on chronic iron deficiency anemia: Baseline Hgb in 9 range. Has improved past transfusion. Add iron supplement. Will continue to monitor. CBC ordered for tomorrow.  14. Right trimalleolar ankle fracture: NWB X 3 weeks. CAM at all  times    LOS: 2 days A FACE TO Stokes E Kirsteins 12/03/2018, 7:29 AM

## 2018-12-03 NOTE — Progress Notes (Signed)
12/03/2018 0445: NT notified RN that pt SpO2 reading was 77%. RN sits the pt upright and advises to take several deep breaths. SpO2 reading increases to 88%.    0512: Pt notified RN that she had saline nasal spray on previous floor. Pt requesting spray for nasal congestion.  0390: Pt given Duoneb treatment by RN. SpO2 reading is now 96%. O2 via nasal cannula is now at 4 L/min. Pt is asymptomatic and resting with call bell at side. RN will continue to monitor.

## 2018-12-03 NOTE — Progress Notes (Signed)
Occupational Therapy Session Note  Patient Details  Name: Amy Shepard MRN: [id number] Date of Birth: 1945-12-06  Today's Date: 12/03/2018 OT Individual Time: 9311-2162 OT Individual Time Calculation (min): 60 min    Short Term Goals: Week 1:  OT Short Term Goal 1 (Week 1): Pt will perform toileting with min A overall . OT Short Term Goal 2 (Week 1): Pt will perform toilet transfer with min A while maintaining NWB precautions.  OT Short Term Goal 3 (Week 1): Pt will perform LB dressing with min A overall to increase I with self care. Week 2:    Week 3:     Skilled Therapeutic Interventions/Progress Updates:    1:! When arrived RT was in the room and pt was on 4 lit of o2 and had desated in the low 80s. After a few minutes pt's O2 was able to come up into the low 90s on 6 liters. Participated in self care retraining at EOB. Pt able to come to EOB with extra time with bed rails with min guard. Pt able to bathe UB with setup and min A for paper top. A needed to wash buttocks and lower LEs.  Doffed cam boot and washing and re ace wrapped right LE and donned CAM boot on again. Lateral leans to don brief and pull up pants. Pt required A to thread pants. Pt performed squat pivot while able to maintaining non-weightbearing status into the w/c with min A,.   Therapy Documentation Precautions:  Precautions Precautions: Fall Required Braces or Orthoses: Other Brace Other Brace: R CAM boot Restrictions Weight Bearing Restrictions: Yes RLE Weight Bearing: Non weight bearing General:   Vital Signs: Therapy Vitals Pulse Rate: 81 Resp: 18 Patient Position (if appropriate): Lying Oxygen Therapy SpO2: 92 % O2 Device: High Flow Nasal Cannula O2 Flow Rate (L/min): 6 L/min Pain: No c/o pain in session   Therapy/Group: Individual Therapy  Willeen Cass [facility] 12/03/2018, 10:53 AM

## 2018-12-04 LAB — GLUCOSE, CAPILLARY
Glucose-Capillary: 157 mg/dL — ABNORMAL HIGH (ref 70–99)
Glucose-Capillary: 163 mg/dL — ABNORMAL HIGH (ref 70–99)
Glucose-Capillary: 266 mg/dL — ABNORMAL HIGH (ref 70–99)
Glucose-Capillary: 155 mg/dL — ABNORMAL HIGH (ref 70–99)

## 2018-12-04 MED ORDER — GABAPENTIN 300 MG PO CAPS
300.0000 mg | ORAL_CAPSULE | Freq: Three times a day (TID) | ORAL | Status: DC
  Administered 2018-12-04 – 2018-12-14 (×30): 300 mg via ORAL
  Filled 2018-12-04 (×30): qty 1

## 2018-12-04 NOTE — Progress Notes (Signed)
[place] PHYSICAL MEDICINE & REHABILITATION PROGRESS NOTE   Subjective/Complaints:   Complains of a wet sensation that has been present for over 1 year and hands and upper back area.  She states that gabapentin was helpful for the symptoms. Also discussed that she prefers celexa to Effexor   ROS- denies CP, SOB, N/V/D  Objective:   No results found. Recent Labs    12/02/18 0416  WBC 7.1  HGB 8.3*  HCT 27.1*  PLT 216   Recent Labs    12/02/18 0416  NA 139  K 4.2  CL 103  CO2 25  GLUCOSE 174*  BUN 32*  CREATININE 1.39*  CALCIUM 9.1    Intake/Output Summary (Last 24 hours) at 12/04/2018 1321 Last data filed at 12/04/2018 0730 Gross per 24 hour  Intake 710 ml  Output 2 ml  Net 708 ml     Physical Exam: Vital Signs Blood pressure (!) 121/57, pulse 81, temperature 98.2 F (36.8 C), temperature source Oral, resp. rate 18, height _0  (1.549 m), weight 84 kg, SpO2 98 %.   General: No acute distress Mood and affect are appropriate Heart: Regular rate and rhythm no rubs murmurs or extra sounds Lungs: Clear to auscultation, breathing unlabored, no rales or wheezes Abdomen: Positive bowel sounds, soft nontender to palpation, nondistended Extremities: No clubbing, cyanosis, or edema Skin: No evidence of breakdown, no evidence of rash, brownish scales 32m diameter no erythema, RUE and upper back, very sparse Neurologic: Cranial nerves II through XII intact, motor strength is 4/5 in bilateral deltoid, bicep, tricep, grip, hip flexor, knee extensors,5/5 left, trace RIght  ankle dorsiflexor and plantar flexor Sensory exam normal sensation to light touch and proprioception in bilateral upper and lower extremities Cerebellar exam normal finger to nose to finger as well as heel to shin in bilateral upper and lower extremities Musculoskeletal: CAM boot RLE   Assessment/Plan: 1. Functional deficits secondary to Right trimalleolar fracture which require 3+ hours per day of  interdisciplinary therapy in a comprehensive inpatient rehab setting.  Physiatrist is providing close team supervision and 24 hour management of active medical problems listed below.  Physiatrist and rehab team continue to assess barriers to discharge/monitor patient progress toward functional and medical goals  Care Tool:  Bathing  Bathing activity did not occur: (seated on BSC) Body parts bathed by patient: Right arm, Left arm, Chest, Abdomen, Front perineal area, Right upper leg, Left upper leg, Face   Body parts bathed by helper: Buttocks, Right lower leg, Left lower leg     Bathing assist Assist Level: Contact Guard/Touching assist     Upper Body Dressing/Undressing Upper body dressing   What is the patient wearing?: Pull over shirt    Upper body assist Assist Level: Set up assist    Lower Body Dressing/Undressing Lower body dressing      What is the patient wearing?: Pants     Lower body assist Assist for lower body dressing: Maximal Assistance - Patient 25 - 49%     Toileting Toileting Toileting Activity did not occur (Landscape architectand hygiene only): Refused  Toileting assist Assist for toileting: Minimal Assistance - Patient > 75%     Transfers Chair/bed transfer  Transfers assist     Chair/bed transfer assist level: Minimal Assistance - Patient > 75%     Locomotion Ambulation   Ambulation assist   Ambulation activity did not occur: Safety/medical concerns          Walk 10 feet activity  Assist  Walk 10 feet activity did not occur: Safety/medical concerns        Walk 50 feet activity   Assist Walk 50 feet with 2 turns activity did not occur: Safety/medical concerns         Walk 150 feet activity   Assist Walk 150 feet activity did not occur: Safety/medical concerns         Walk 10 feet on uneven surface  activity   Assist Walk 10 feet on uneven surfaces activity did not occur: Safety/medical concerns          Wheelchair     Assist Will patient use wheelchair at discharge?: Yes Type of Wheelchair: Manual    Wheelchair assist level: Supervision/Verbal cueing Max wheelchair distance: 120    Wheelchair 50 feet with 2 turns activity    Assist        Assist Level: Supervision/Verbal cueing   Wheelchair 150 feet activity     Assist     Assist Level: Supervision/Verbal cueing    Medical Problem List and Plan: 1.  Functional deficits secondary to right trimalleolar ankle fracture and acute on chronic respiratory failure.            CIR PT, OT 2.  Antithrombotics: -DVT/anticoagulation:  Pharmaceutical: Lovenox             -antiplatelet therapy: N/A. 3. Pain Management:  Controlled with prn tylenol and robaxin.             Monitor with increased mobility Paresthesias the distribution is atypical for neuropathy but they do respond to gabapentin will resume 300 3 times daily 4. Mood: LCSW to follow for evaluation and support.              -antipsychotic agents: N/A Hx depression d/c Effexor, restart Celexa 5. Neuropsych: This patient is capable of making decisions on her own behalf. 6. Skin/Wound Care: Routine pressure relief measures. Skin area is not psoriasis, possible seborrhea, will f/u with derm as OP 7. Fluids/Electrolytes/Nutrition: Monitor I/O.  Good intake may discontinue IV 8. H/o Lung cancer/COPD- O2 dependent: Currently on 6L HF oxygen--> weaned to 4L HF this afternoon.  Continue to wean supplemental oxygen as tolerated.  Continue Dulera bid with duonebs prn.  Resp virus testing (including SARS Covid 2 neg) ENc IS and Flutter valve 9. Chronic systolic CHF with pulmonary HTN: Monitor weights.  Continue Opsumit and Selexipag. On Lipitor. Currently stable on lower dose lasix.  10. T2DM;  Continue Levemir and titrate as needed. Off Trulicity at this time. Will add meal coverage as at home and use SSI for elevated BS. Continue to monitor BS ac/hs.  Monitor with  increased mobility. CBG (last 3)  Recent Labs    12/03/18 2112 12/04/18 0630 12/04/18 1159  GLUCAP 276* 157* 163*  improved 5/22 11. CKD:  Continue to monitor renal status with serial checks. Has improved with SCr-1.22.  BMP ordered for tomorrow a.m. 12. Chronic hypotension: Monitor BP tid. On midodrine with BP improving.  Monitor with increased mobility. 13. Acute on chronic iron deficiency anemia: Baseline Hgb in 9 range. Has improved past transfusion. Add iron supplement. Will continue to monitor. CBC ordered for tomorrow.  14. Right trimalleolar ankle fracture: NWB X 3 weeks. CAM at all times    LOS: 3 days A FACE TO Coral E Kirsteins 12/04/2018, 1:21 PM

## 2018-12-04 NOTE — IPOC Note (Signed)
Overall Plan of Care [facility]) Patient Details Name: Amy Shepard MRN: [id number] DOB: [birth year]  Admitting Diagnosis: <principal problem not specified>  Hospital Problems: Active Problems:   Trimalleolar fracture of ankle, closed, right, sequela     Functional Problem List: Nursing Endurance, Skin Integrity, Medication Management, Pain  PT Balance, Edema, Safety, Sensory, Skin Integrity  OT Balance, Cognition, Endurance, Motor, Safety  SLP    TR         Basic ADL's: OT Grooming, Bathing, Dressing, Toileting     Advanced  ADL's: OT Simple Meal Preparation, Laundry     Transfers: PT Bed to Chair, Car, Furniture, Floor, [employer], Metallurgist: PT Ambulation     Additional Impairments: OT None  SLP        TR      Anticipated Outcomes Item Anticipated Outcome  Self Feeding n/a  Swallowing      Basic self-care  mod I wheelchair level  Toileting  mod I wheelchair level   Bathroom Transfers mod I wheelchair level  Bowel/Bladder  Remain continent of bowel and bladder and maintain regular pattern of voiding  Transfers  Mod I with LRAD   Locomotion  Mod at [facility] level   Communication     Cognition     Pain  remain free of pain or less than 3  Safety/Judgment  No falls, infection, skin breakdown while on rehab   Therapy Plan: PT Intensity: Minimum of 1-2 x/day ,45 to 90 minutes PT Frequency: 5 out of 7 days PT Duration Estimated Length of Stay: 10-14 days  OT Intensity: Minimum of 1-2 x/day, 45 to 90 minutes OT Frequency: 5 out of 7 days OT Duration/Estimated Length of Stay: 2 weeks     Due to the current state of emergency, patients may not be receiving their 3-hours of Medicare-mandated therapy.   Team Interventions: Nursing Interventions Patient/Family Education, Pain Management, Disease Management/Prevention, Skin Care/Wound Management, Medication Management, Discharge Planning, Psychosocial Support  PT  interventions Ambulation/gait training, Cognitive remediation/compensation, Discharge planning, DME/adaptive equipment instruction, Functional mobility training, Pain management, Psychosocial support, Splinting/orthotics, Therapeutic Activities, UE/LE Strength taining/ROM, Visual/perceptual remediation/compensation, Wheelchair propulsion/positioning, UE/LE Coordination activities, Therapeutic Exercise, Stair training, Skin care/wound management, Patient/family education, Neuromuscular re-education, Disease management/prevention, Academic librarian, Training and development officer  OT Interventions Training and development officer, Disease mangement/prevention, Neuromuscular re-education, Self Care/advanced ADL retraining, Therapeutic Exercise, DME/adaptive equipment instruction, Pain management, UE/LE Strength taining/ROM, Academic librarian, Barrister's clerk education, Splinting/orthotics, UE/LE Coordination activities, Discharge planning, Functional mobility training, Psychosocial support, Therapeutic Activities  SLP Interventions    TR Interventions    SW/CM Interventions Discharge Planning, Psychosocial Support, Patient/Family Education   Barriers to Discharge MD  Medical stability and Weight bearing restrictions  Nursing Decreased caregiver support    PT Decreased caregiver support, Medical stability, Weight bearing restrictions, Insurance for SNF coverage    OT Decreased caregiver support lives alone  SLP      SW Decreased caregiver support, Lack of/limited family support Will not have assist at DC   Team Discharge Planning: Destination: PT-Home ,OT- Home , SLP-  Projected Follow-up: PT-Home health PT, OT-  Home health OT, SLP-  Projected Equipment Needs: PT-Wheelchair (measurements), Wheelchair cushion (measurements), Sliding board, Rolling walker with 5" wheels, OT- To be determined, SLP-  Equipment Details: PT- , OT-  Patient/family involved in discharge planning: PT- Patient,   OT-Patient, SLP-   MD ELOS: 10-14d Medical Rehab Prognosis:  Good Assessment:  73 year old female with history of CAD, CKD, COPD  with chronic hypoxic/hypercapnic respiratory failure- 4 L oxygen dependent, lung cancer, S3PR, chronic systolic CHF with pulmonary HTN, gait disorder who sustained a fall due to weakness, twisted her right ankle and had sudden onset of severe pain.  History taken from chart review and patient.  She was found to have trimalleolar right ankle fracture and underwent ORIF of right ankle on 11/23/2018 by Dr. Sharol Given. To be nonweightbearing X 3 weeks with incisional VAC X one week.   Post op course complicated by hypotension and somnolence requiring pressors. PCCM consulted and patient treated with IV lasix, BIPAP and midodrine resumed as well as BDs as symptoms felt to be multifactorial due to anemia, fluid overload in setting of cor pulmonale, sleep related hypoventilation. She was found to have ABLA and was transfused 2 units PRBCs. Opsumit and Selexipeg resumed,  off BIPAP but has required HF oxygen now down to 6- 7L per [place] due to increased oxygen needs--pulmonary recommendations  keeping SpO2 90-92%.    Now requiring 24/7 Rehab RN,MD, as well as CIR level PT, OT and SLP.  Treatment team will focus on ADLs and mobility with goals set at Mod I See Team Conference Notes for weekly updates to the plan of care

## 2018-12-05 ENCOUNTER — Inpatient Hospital Stay (HOSPITAL_COMMUNITY): Payer: Medicare Other | Admitting: Occupational Therapy

## 2018-12-05 ENCOUNTER — Inpatient Hospital Stay (HOSPITAL_COMMUNITY): Payer: Medicare Other | Admitting: Physical Therapy

## 2018-12-05 LAB — GLUCOSE, CAPILLARY
Glucose-Capillary: 136 mg/dL — ABNORMAL HIGH (ref 70–99)
Glucose-Capillary: 188 mg/dL — ABNORMAL HIGH (ref 70–99)
Glucose-Capillary: 122 mg/dL — ABNORMAL HIGH (ref 70–99)

## 2018-12-05 NOTE — Progress Notes (Signed)
Occupational Therapy Session Note  Patient Details  Name: Amy Shepard MRN: [id number] Date of Birth: 1945/10/06  Today's Date: 12/05/2018 OT Individual Time: 0459-1368 and 5992-3414 OT Individual Time Calculation (min): 57 min and 58 min  Short Term Goals: Week 1:  OT Short Term Goal 1 (Week 1): Pt will perform toileting with min A overall . OT Short Term Goal 2 (Week 1): Pt will perform toilet transfer with min A while maintaining NWB precautions.  OT Short Term Goal 3 (Week 1): Pt will perform LB dressing with min A overall to increase I with self care.  Skilled Therapeutic Interventions/Progress Updates:    Pt greeted in w/c with no c/o pain and ADL needs met. 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 Opting to instead work on [employer]. Provided her with UE HEP handout using theraband. We reviewed exercise techniques together using yellow tband x10 reps. Education emphasis was placed on breathing out with exertion and pacing. 02 sats decreased to 83-85% at times (on 6L), with pt able to increase to 91-93% given diaphragmatic breathing instruction. We also used the 2# bar for continued strengthening afterwards. She was a bit teary during tx, c/o about having a "wet" sensation all over her body that has persisted for 2 years, and that she was having a tough time sleeping. Discussed sleep hygiene strategies to implement including listening to calming music/nature sounds, deep breathing, and using aromatherapy techniques. Povided her with lavender to use via inhalation during session to promote increased relaxation. At end of tx pt remained in w/c with all needs within reach and chair alarm set.   2nd Session 1: 1 tx (58 min) Pt greeted in w/c. ADL needs met and agreeable to tx. Session focused placed on maintaining stable 02 sats during therapeutic activities,  endurance, and cheering her up. Pt was escorted via w/c to dayroom. She engaged in wii dancing w/c level. Pt sang along with OT and sat at edge of chair to mirror dance moves on the screen. Pt smiling and laughing during this time. After each song, her 02 sats decreased to 83-85%. It took less than 30 seconds for sats to increase to 91-93% range. During dancing, reinforced diaphragmatic breathing techniques and taking rest breaks when needed to conserve energy. Afterwards, to work on [place], pt self propelled 3/4 way to the family room to buy something from the vending machine. She took 5 rest breaks on the way. Once she bought a bag of chips, pt was escorted back to room and left in w/c with chair alarm set and all needs within reach.    Therapy Documentation Precautions:  Precautions Precautions: Fall Required Braces or Orthoses: Other Brace Other Brace: R CAM boot Restrictions Weight Bearing Restrictions: Yes RLE Weight Bearing: Non weight bearing ADL:  Pain: During 2nd session, pt reported pain in neck at end of tx. RN notified to provide tylenol        Therapy/Group: Individual Therapy  [name] 12/05/2018, 12:37 PM

## 2018-12-05 NOTE — Progress Notes (Signed)
Physical Therapy Session Note  Patient Details  Name: Amy Shepard MRN: [id number] Date of Birth: Jul 19, 1945  Today's Date: 12/05/2018 PT Individual Time: 1108-1205 PT Individual Time Calculation (min): 57 min   Short Term Goals: Week 1:  PT Short Term Goal 1 (Week 1): Pt will perform bed mobility with supervision assist consistently  PT Short Term Goal 2 (Week 1): Pt will performed SB transfer wit supervision assist PT Short Term Goal 3 (Week 1): Pt will propell WC 164f with supervision assist and no rest breaks  PT Short Term Goal 4 (Week 1): Pt will perform sit<>stand with min assist and maintain NWB with LRAD   Skilled Therapeutic Interventions/Progress Updates: Pt presented in w/c agreeable to therapy. Requesting to use bathroom. Transferred to  Bathroom and performed squat pivot transfer to toilet with BSC over toilet and using wall rail. Pt more attempted to try stand pivot and thus placed wt on RLE. Pt unable to maintain NWB restrictions to return to w/c as well. Pt then transported to rehab gym and performed STS from w/c with PTA placing shoe under pt's boot for feedback on wt bearing. Pt performed STS from w/c minA with verbal cues for hand placement and noted to place heavy wt on PTA's shoe. PTA then obtained wt bearing board to provide pt audio feedback on wt bearing. Pt able to perform x2 STS with improved feedback but still intermittently placing wt. PTA then changed board to boot and set on highest sensitivity with pt able to decrease wt even more. Pt then attempted stand pivot transfer with minA and x1 occurrence of boot alarm beeping. Pt then returned to w/c in same manner with improved technique. Pt propelled back to room supervision with frequent rest breaks due to fatigue and remained in w/c at end of session. PTA noted increased swelling in L foot and obtained ELR for LLE. Pt was able to place LLE on bed and stating felt more comfortable. Pt left with chair alarm on, call  bell within reach and needs met.      Therapy Documentation Precautions:  Precautions Precautions: Fall Required Braces or Orthoses: Other Brace Other Brace: R CAM boot Restrictions Weight Bearing Restrictions: Yes RLE Weight Bearing: Non weight bearing   Therapy/Group: Individual Therapy  Celedonio Sortino  Kaydin Labo, PTA  12/05/2018, 1:03 PM

## 2018-12-06 ENCOUNTER — Inpatient Hospital Stay (HOSPITAL_COMMUNITY): Payer: Medicare Other | Admitting: Occupational Therapy

## 2018-12-06 ENCOUNTER — Inpatient Hospital Stay (HOSPITAL_COMMUNITY): Payer: Medicare Other | Admitting: Physical Therapy

## 2018-12-06 DIAGNOSIS — E119 Type 2 diabetes mellitus without complications: Secondary | ICD-10-CM

## 2018-12-06 LAB — BASIC METABOLIC PANEL
Anion gap: 10 (ref 5–15)
BUN: 34 mg/dL — ABNORMAL HIGH (ref 8–23)
CO2: 26 mmol/L (ref 22–32)
Calcium: 9.3 mg/dL (ref 8.9–10.3)
Chloride: 102 mmol/L (ref 98–111)
Creatinine, Ser: 1.29 mg/dL — ABNORMAL HIGH (ref 0.44–1.00)
GFR calc Af Amer: 48 mL/min — ABNORMAL LOW (ref >60)
GFR calc non Af Amer: 41 mL/min — ABNORMAL LOW (ref >60)
Glucose, Bld: 199 mg/dL — ABNORMAL HIGH (ref 70–99)
Potassium: 4.4 mmol/L (ref 3.5–5.1)
Sodium: 138 mmol/L (ref 135–145)

## 2018-12-06 LAB — GLUCOSE, CAPILLARY
Glucose-Capillary: 286 mg/dL — ABNORMAL HIGH (ref 70–99)
Glucose-Capillary: 183 mg/dL — ABNORMAL HIGH (ref 70–99)
Glucose-Capillary: 204 mg/dL — ABNORMAL HIGH (ref 70–99)
Glucose-Capillary: 109 mg/dL — ABNORMAL HIGH (ref 70–99)
Glucose-Capillary: 191 mg/dL — ABNORMAL HIGH (ref 70–99)

## 2018-12-06 LAB — CBC
HCT: 29.5 % — ABNORMAL LOW (ref 36.0–46.0)
Hemoglobin: 8.8 g/dL — ABNORMAL LOW (ref 12.0–15.0)
MCH: 24 pg — ABNORMAL LOW (ref 26.0–34.0)
MCHC: 29.8 g/dL — ABNORMAL LOW (ref 30.0–36.0)
MCV: 80.6 fL (ref 80.0–100.0)
Platelets: 280 10*3/uL (ref 150–400)
RBC: 3.66 MIL/uL — ABNORMAL LOW (ref 3.87–5.11)
RDW: 19.7 % — ABNORMAL HIGH (ref 11.5–15.5)
WBC: 10.1 10*3/uL (ref 4.0–10.5)
nRBC: 0 % (ref 0.0–0.2)

## 2018-12-06 MED ORDER — FUROSEMIDE 20 MG PO TABS
20.0000 mg | ORAL_TABLET | Freq: Every day | ORAL | Status: DC
  Administered 2018-12-07 – 2018-12-10 (×4): 20 mg via ORAL
  Filled 2018-12-06 (×4): qty 1

## 2018-12-06 MED ORDER — INSULIN DETEMIR 100 UNIT/ML ~~LOC~~ SOLN
22.0000 [IU] | Freq: Every day | SUBCUTANEOUS | Status: DC
  Administered 2018-12-06 – 2018-12-08 (×3): 22 [IU] via SUBCUTANEOUS
  Filled 2018-12-06 (×4): qty 0.22

## 2018-12-06 NOTE — Progress Notes (Signed)
Occupational Therapy Session Note  Patient Details  Name: Amy Shepard MRN: [id number] Date of Birth: 1946-02-07  Today's Date: 12/06/2018 OT Individual Time: 6837-2902 OT Individual Time Calculation (min): 30 min    Short Term Goals: Week 1:  OT Short Term Goal 1 (Week 1): Pt will perform toileting with min A overall . OT Short Term Goal 2 (Week 1): Pt will perform toilet transfer with min A while maintaining NWB precautions.  OT Short Term Goal 3 (Week 1): Pt will perform LB dressing with min A overall to increase I with self care.  Skilled Therapeutic Interventions/Progress Updates:    Patient in bed and agreeable to transfer OOB and complete adl tasks w/c level.  Bed mobility with min a.  Sit pivot transfer bed to w/c with min A and mod cues for safe technique.  Bathing completed w/c level at sink with set up.  UB dressing with set up, max A for pants over CAM boot.  Patient able to stand with RW min A cues for NWB, dependent for pants up over hips.  She remained seated in the w/c at close of session with chair alarm set and call bell in reach.    Therapy Documentation Precautions:  Precautions Precautions: Fall Required Braces or Orthoses: Other Brace Other Brace: R CAM boot Restrictions Weight Bearing Restrictions: Yes RLE Weight Bearing: Non weight bearing General:   Vital Signs: Therapy Vitals Temp: 98.1 F (36.7 C) Pulse Rate: 84 BP: (!) 98/52 Patient Position (if appropriate): Lying Oxygen Therapy SpO2: (!) 89 % O2 Device: Nasal Cannula O2 Flow Rate (L/min): 6 L/min Pain: Pain Assessment Pain Scale: 0-10 Pain Score: 4  Pain Type: Acute pain Pain Location: Neck Pain Orientation: Posterior Pain Descriptors / Indicators: Aching Pain Frequency: Constant Pain Onset: On-going Pain Intervention(s): Repositioned   Other Treatments:     Therapy/Group: Individual Therapy  Carlos Levering 12/06/2018, 12:25 PM

## 2018-12-06 NOTE — Progress Notes (Signed)
[place] PHYSICAL MEDICINE & REHABILITATION PROGRESS NOTE   Subjective/Complaints:  Having a lot of neck/shoulder pain today. Has had at home prior to admit. Also asked if boot could be loosened   ROS: Patient denies fever, rash, sore throat, blurred vision, nausea, vomiting, diarrhea, cough, shortness of breath or chest pain, joint or back pain, headache, or mood change.    Objective:   No results found. Recent Labs    12/06/18 0604  WBC 10.1  HGB 8.8*  HCT 29.5*  PLT 280   Recent Labs    12/06/18 0604  NA 138  K 4.4  CL 102  CO2 26  GLUCOSE 199*  BUN 34*  CREATININE 1.29*  CALCIUM 9.3    Intake/Output Summary (Last 24 hours) at 12/06/2018 1138 Last data filed at 12/05/2018 2044 Gross per 24 hour  Intake 600 ml  Output -  Net 600 ml     Physical Exam: Vital Signs Blood pressure (!) 98/52, pulse 84, temperature 98.1 F (36.7 C), resp. rate 18, height _0  (1.549 m), weight 78.6 kg, SpO2 (!) 89 %.   Constitutional: No distress . Vital signs reviewed. HEENT: EOMI, oral membranes moist Neck: supple Cardiovascular: RRR without murmur. No JVD    Respiratory: CTA Bilaterally without wheezes or rales. Normal effort    GI: BS +, non-tender, non-distended  Extremities: No clubbing, cyanosis, or edema Skin: No evidence of breakdown, no evidence of rash, brownish scales 7m diameter no erythema, RUE and upper back, very sparse Neurologic: Cranial nerves II through XII intact, motor strength is 4/5 in bilateral deltoid, bicep, tricep, grip, hip flexor, knee extensors,5/5 left, trace RIght  ankle dorsiflexor and plantar flexor Sensory exam normal sensation to light touch and proprioception in bilateral upper and lower extremities Cerebellar exam normal finger to nose to finger as well as heel to shin in bilateral upper and lower extremities Musculoskeletal: CAM boot RLE in place. Neck with sl spasm/tenderness along upper traps   Assessment/Plan: 1. Functional  deficits secondary to Right trimalleolar fracture which require 3+ hours per day of interdisciplinary therapy in a comprehensive inpatient rehab setting.  Physiatrist is providing close team supervision and 24 hour management of active medical problems listed below.  Physiatrist and rehab team continue to assess barriers to discharge/monitor patient progress toward functional and medical goals  Care Tool:  Bathing  Bathing activity did not occur: (seated on BSC) Body parts bathed by patient: Right arm, Left arm, Chest, Abdomen, Front perineal area, Right upper leg, Left upper leg, Face   Body parts bathed by helper: Buttocks, Right lower leg, Left lower leg     Bathing assist Assist Level: Contact Guard/Touching assist     Upper Body Dressing/Undressing Upper body dressing   What is the patient wearing?: Pull over shirt    Upper body assist Assist Level: Set up assist    Lower Body Dressing/Undressing Lower body dressing      What is the patient wearing?: Pants     Lower body assist Assist for lower body dressing: Maximal Assistance - Patient 25 - 49%     Toileting Toileting Toileting Activity did not occur (Landscape architectand hygiene only): Refused  Toileting assist Assist for toileting: Minimal Assistance - Patient > 75%     Transfers Chair/bed transfer  Transfers assist     Chair/bed transfer assist level: Minimal Assistance - Patient > 75%     Locomotion Ambulation   Ambulation assist   Ambulation activity did not occur: Safety/medical  concerns          Walk 10 feet activity   Assist  Walk 10 feet activity did not occur: Safety/medical concerns        Walk 50 feet activity   Assist Walk 50 feet with 2 turns activity did not occur: Safety/medical concerns         Walk 150 feet activity   Assist Walk 150 feet activity did not occur: Safety/medical concerns         Walk 10 feet on uneven surface  activity   Assist Walk  10 feet on uneven surfaces activity did not occur: Safety/medical concerns         Wheelchair     Assist Will patient use wheelchair at discharge?: Yes Type of Wheelchair: Manual    Wheelchair assist level: Supervision/Verbal cueing Max wheelchair distance: 120    Wheelchair 50 feet with 2 turns activity    Assist        Assist Level: Supervision/Verbal cueing   Wheelchair 150 feet activity     Assist     Assist Level: Supervision/Verbal cueing    Medical Problem List and Plan: 1.  Functional deficits secondary to right trimalleolar ankle fracture and acute on chronic respiratory failure.            CIR PT, OT ongoing 2.  Antithrombotics: -DVT/anticoagulation:  Pharmaceutical: Lovenox             -antiplatelet therapy: N/A. 3. Pain Management:  Controlled with prn tylenol and robaxin.             Monitor with increased mobility  Paresthesias the distribution is atypical for neuropathy  gabapentin resumed at 300 3 times daily 4. Mood: LCSW to follow for evaluation and support.              -antipsychotic agents: N/A Hx depression d/c Effexor, restart Celexa 5. Neuropsych: This patient is capable of making decisions on her own behalf. 6. Skin/Wound Care: Routine pressure relief measures. Skin area is not psoriasis, possible seborrhea, will f/u with derm as OP 7. Fluids/Electrolytes/Nutrition: Monitor I/O.    IVF stopped  -BUN sl increased, trending back up  -decrease lasix to 27m daily 8. H/o Lung cancer/COPD- O2 dependent: Currently on 6L HF oxygen--> weaned to 4L HF this afternoon.  Continue to wean supplemental oxygen as tolerated.  Continue Dulera bid with duonebs prn.  Resp virus testing (including SARS Covid 2 neg) ENc IS and Flutter valve 9. Chronic systolic CHF with pulmonary HTN: Monitor weights.  Continue Opsumit and Selexipag. On Lipitor.   -decrease lasix to 260mdaily per #7,11.  10. T2DM;  Continue Levemir and titrate as needed. Off  Trulicity at this time. Will add meal coverage as at home and use SSI for elevated BS. Continue to monitor BS ac/hs.  Monitor with increased mobility. CBG (last 3)  Recent Labs    12/05/18 1605 12/05/18 2127 12/06/18 0630  GLUCAP 122* 286* 183*  -increase levemir to 22u QHS 5/25 11. CKD:  Continue to monitor renal status with serial checks. Has improved with SCr-1.22.  BUN sl elevated, Cr near baseline  -encourage fluids, decrease lasix 12. Chronic hypotension: Monitor BP tid. On midodrine with BP improving.  Monitor with increased mobility. 13. Acute on chronic iron deficiency anemia: Baseline Hgb in 9 range. Has improved past transfusion. Added iron supplement. Will continue to monitor.  -hgb 8.8 and c/w recent labs 14. Right trimalleolar ankle fracture: NWB X 3 weeks. CAM at  all times   -may loosen while in bed periodically for comfort  LOS: 5 days A FACE TO [place] 12/06/2018, 11:38 AM

## 2018-12-06 NOTE — Progress Notes (Signed)
   12/06/18 1600  Clinical Encounter Type  Visited With Patient  Visit Type Initial;Spiritual support  Referral From Nurse  Consult/Referral To Chaplain  Spiritual Encounters  Spiritual Needs Emotional;Prayer   Responded to spiritual care consult. PT was alert and sitting up in her chair. PT was delighted to see Chaplain and greeted me with a smile. PT asked me to sit. PT stated that she had pain in her left leg and is looking forward to the x-ray she was told she was getting soon. I offered spiritual care with words of comfort, ministry of presence, and prayer. PT was very thankful for Chaplain presence.  Chaplain Fidel Levy [phone]

## 2018-12-06 NOTE — Progress Notes (Signed)
Physical Therapy Session Note  Patient Details  Name: Amy Shepard MRN: [id number] Date of Birth: 09-07-1945  Today's Date: 12/06/2018 PT Individual Time: 0800-0855 PT Individual Time Calculation (min): 55 min   Short Term Goals: Week 1:  PT Short Term Goal 1 (Week 1): Pt will perform bed mobility with supervision assist consistently  PT Short Term Goal 2 (Week 1): Pt will performed SB transfer wit supervision assist PT Short Term Goal 3 (Week 1): Pt will propell WC 129f with supervision assist and no rest breaks  PT Short Term Goal 4 (Week 1): Pt will perform sit<>stand with min assist and maintain NWB with LRAD   Skilled Therapeutic Interventions/Progress Updates:   Pt received supine in bed and agreeable to PT at bed level.  PT assessed Pt vital signs: see below for details. PT instructed pt in BLE therex. SLR 2x12, heel slides 2 x10, quad sets 2 x 15, glute sets 2 x 12, LLE ankle DF/PF 2 x. Pt required multiple rest breaks due to SOB, neck pain and fatigue. Pt performed rolling and scooting in bed with use of the LLE and bed rails to improved positioning and pain management. Pt left supine in bed with call bell in reach and all needs met.         Therapy Documentation Precautions:  Precautions Precautions: Fall Required Braces or Orthoses: Other Brace Other Brace: R CAM boot Restrictions Weight Bearing Restrictions: Yes RLE Weight Bearing: Non weight bearing    Vital Signs: Therapy Vitals Temp: 98.1 F (36.7 C) Temp Source: Oral Pulse Rate: 81 Resp: 18 BP: (!) 103/50 Patient Position (if appropriate): Lying Oxygen Therapy SpO2: (!) 88 % O2 Device: Nasal Cannula O2 Flow Rate (L/min): 6 L/min Pain: Pain Assessment Pain Scale: 0-10 Pain Score: 8  Pain Type: Acute pain Pain Location: Neck Pain Orientation: Posterior Pain Descriptors / Indicators: Aching Patients Stated Pain Goal: 4  See MAR    Therapy/Group: Individual Therapy  ALorie Phenix5/25/2020, 9:00 AM

## 2018-12-06 NOTE — Progress Notes (Signed)
Occupational Therapy Session Note  Patient Details  Name: Amy Shepard MRN: [id number] Date of Birth: 1946-06-04  Today's Date: 12/06/2018 OT Individual Time: 5465-6812 and 1342-1440 OT Individual Time Calculation (min): 49 min and 58 min  Short Term Goals: Week 1:  OT Short Term Goal 1 (Week 1): Pt will perform toileting with min A overall . OT Short Term Goal 2 (Week 1): Pt will perform toilet transfer with min A while maintaining NWB precautions.  OT Short Term Goal 3 (Week 1): Pt will perform LB dressing with min A overall to increase I with self care.  Skilled Therapeutic Interventions/Progress Updates:    Pt greeted in w/c, receiving pain medication from RN. Discussed with pt why it is so hard for her to keep NWB precautions with R LE. She reports still having L LE pain from foot fx a year ago. Per pt, pain in L LE is 9/10 during stand/squat pivot transfers. Noted increased swelling in L LE during tx. She was agreeable to try the slideboard for [facility] transfers. Min A for slideboard<BSC with OT keeping R LE off of floor. She completed lateral leans for elevating/lowering theraband over hips multiple times (with supervision). Pt would actively weightbear through R LE at this time, even when OT was keeping limb off of floor and providing cuing for NWB. Her toilet at home is flanked by grab bars and a supportive counter. So lateral leans will be no issue at d/c. She also reports that her bathroom sink has a w/c cut out that will work out for B/D. Encouraged lateral leans for perihygiene and LB dressing for precaution adherence. Also recommended purchase of life alert necklace due to fall hx (>5 falls this past year). She reports having concrete flooring throughout the house and often sustains injuries from falls. 02 sats post transfers/activity 93-94% on 6L. OT structured rest breaks during session with guided diaphragmatic breathing exercises. Pt transferred back to w/c and was left in w/c  with all needs within reach and chair alarm set. Due to increased pain/swelling in L LE, limb was elevated on bed with ice. OT performed sensation testing to ensure ice was on skin with intact sensation.     2nd Session 1:1 tx (58 min) Pt greeted in w/c and amenable to tx. Donned Teds for L LE and provided her with elevating legrest for L LE. We practiced pt retrieving leg rests from floor and applying them to w/c. She had a great deal of difficultly doing this, requiring Max A from OT. Sats dropped to 85-87% after each attempt, increasing to 91-97% with diaphragmatic breathing. We discussed IADL participation in depth. When asked how she was going to prepare her breakfast, she reported that she planned to use a tray that was attached to her RW, and hop in the kitchen. With OT-pt collaboration, we delineated a plan for simple meal prep w/c level. She doesn't have a table, so pt will be able to prepare cereal on the kitchen counter and eat her food there. She can also wash dishes w/c level at the sink. Her grandson can bring her microwavable meals for dinner and fruit for lunch. She reported she can transport bottled waters in the w/c with her. Discussed keeping plates and cups on the countertop for easy access. Also keeping things in the pantry within easy reach. Her grandson can also help with her with laundry, as the dryer is stacked on top of the washer, and is inaccessible from the w/c. She has  [employer] cleaning supplies to do light cleaning w/c level. Pt also reported she plans to engage in scrapbooking for family photo albums as a hobby at home. Pt will benefit from practicing w/c level meal prep + housekeeping tasks during future sessions. We also discussed her sleeping situation. Pt does not have a bed and sleeps on the couch. She reports that she often rolls off the couch and falls onto the concrete floor when sleeping. Discussed with both pt and SW about the possibility of hospital bed for d/c. Pt reported  this will fit inside her house. At end of session pt was left in w/c with all needs within reach, chair alarm set, and ice applied to sore L LE.   Therapy Documentation Precautions:  Precautions Precautions: Fall Required Braces or Orthoses: Other Brace Other Brace: R CAM boot Restrictions Weight Bearing Restrictions: Yes RLE Weight Bearing: Non weight bearing Vital Signs: Therapy Vitals Temp: 98.1 F (36.7 C) Pulse Rate: 84 BP: (!) 98/52 Patient Position (if appropriate): Lying Oxygen Therapy SpO2: (!) 89 % O2 Device: Nasal Cannula O2 Flow Rate (L/min): 6 L/min Pain: Pt premedicated for neck pain during 2nd session  Pain Assessment Pain Scale: 0-10 Pain Score: 6  Pain Type: Acute pain Pain Location: Neck Pain Orientation: Posterior Pain Descriptors / Indicators: Aching Pain Frequency: Constant Pain Onset: On-going Pain Intervention(s): Heat applied ADL:       Therapy/Group: Individual Therapy  Sahid Borba A Avanish Cerullo 12/06/2018, 12:11 PM

## 2018-12-07 ENCOUNTER — Inpatient Hospital Stay (HOSPITAL_COMMUNITY): Payer: Medicare Other | Admitting: Occupational Therapy

## 2018-12-07 ENCOUNTER — Telehealth: Payer: Self-pay

## 2018-12-07 ENCOUNTER — Inpatient Hospital Stay (HOSPITAL_COMMUNITY): Payer: Medicare Other

## 2018-12-07 ENCOUNTER — Inpatient Hospital Stay (HOSPITAL_COMMUNITY): Payer: Medicare Other | Admitting: Physical Therapy

## 2018-12-07 LAB — GLUCOSE, CAPILLARY
Glucose-Capillary: 183 mg/dL — ABNORMAL HIGH (ref 70–99)
Glucose-Capillary: 197 mg/dL — ABNORMAL HIGH (ref 70–99)
Glucose-Capillary: 127 mg/dL — ABNORMAL HIGH (ref 70–99)
Glucose-Capillary: 197 mg/dL — ABNORMAL HIGH (ref 70–99)

## 2018-12-07 NOTE — Progress Notes (Signed)
Physical Therapy Session Note  Patient Details  Name: Amy Shepard MRN: [id number] Date of Birth: Jan 27, 1946  Today's Date: 12/07/2018 PT Individual Time: 0900-1000 PT Individual Time Calculation (min): 60 min   Short Term Goals: Week 1:  PT Short Term Goal 1 (Week 1): Pt will perform bed mobility with supervision assist consistently  PT Short Term Goal 2 (Week 1): Pt will performed SB transfer wit supervision assist PT Short Term Goal 3 (Week 1): Pt will propell WC 194f with supervision assist and no rest breaks  PT Short Term Goal 4 (Week 1): Pt will perform sit<>stand with min assist and maintain NWB with LRAD   Skilled Therapeutic Interventions/Progress Updates: Pt presented in w/c agreeable to therapy. Pt currently with off loading shoe on LLE. Pt requesting to use BSC. Performed multiple attempts to perform squat [facility] unable to maintain NWB with each attempt. PTA then obtained 2in block for pt to place RLE on to minimize pressure of RLE. Pt then performed squat pivot to BSC with minA. Pt then attempted to push up off BSC to perform clothing management with poor awareness of maintaining NWB on RLE (+BM). Pt transferred back to w/c in same manner. Pt then transported to ADL apt for energy conservation and performed SB transfer to couch. Pt required total A for set up however was able to perform SB transfer "downhill" onto couch and "uphill" into w/c with CGA and increased time. Pt transported back to room at end of session and remained in w/c. Pt left with seat alarm on, call bell within reach and needs met.      Therapy Documentation Precautions:  Precautions Precautions: Fall Required Braces or Orthoses: Other Brace Other Brace: R CAM boot Restrictions Weight Bearing Restrictions: Yes RLE Weight Bearing: Non weight bearing General:   Vital Signs: Therapy Vitals Temp: 98.9 F (37.2 C) Temp Source: Oral Pulse Rate: 67 Resp: 20 BP: (!) 115/50 Patient  Position (if appropriate): Sitting Oxygen Therapy SpO2: 99 % O2 Device: Nasal Cannula O2 Flow Rate (L/min): 6 L/min Pain: Pain Assessment Pain Scale: 0-10 Pain Score: 4  Pain Type: Acute pain Pain Location: Neck Pain Orientation: Posterior Pain Descriptors / Indicators: Aching;Discomfort Pain Frequency: Constant Pain Onset: On-going Patients Stated Pain Goal: 3 Pain Intervention(s): Medication (See eMAR)    Therapy/Group: Individual Therapy  Lena Gores  Cherry [place], PTA  12/07/2018, 3:51 PM

## 2018-12-07 NOTE — Progress Notes (Signed)
[facility] PHYSICAL MEDICINE & REHABILITATION PROGRESS NOTE   Subjective/Complaints:  Left toe pain, indicates she broke it and Dr Sharol Given did surgery 2 years ago, review of Epic showed fixation for Lisfranc dislocation   ROS: Patient denies fever, rash, sore throat, blurred vision, nausea, vomiting, diarrhea, cough, shortness of breath or chest pain, joint or back pain, headache, or mood change.    Objective:   No results found. Recent Labs    12/06/18 0604  WBC 10.1  HGB 8.8*  HCT 29.5*  PLT 280   Recent Labs    12/06/18 0604  NA 138  K 4.4  CL 102  CO2 26  GLUCOSE 199*  BUN 34*  CREATININE 1.29*  CALCIUM 9.3    Intake/Output Summary (Last 24 hours) at 12/07/2018 0717 Last data filed at 12/06/2018 1846 Gross per 24 hour  Intake 720 ml  Output -  Net 720 ml     Physical Exam: Vital Signs Blood pressure (!) 115/53, pulse 72, temperature 97.9 F (36.6 C), temperature source Oral, resp. rate 16, height _0  (1.549 m), weight 80.5 kg, SpO2 94 %.   Constitutional: No distress . Vital signs reviewed. HEENT: EOMI, oral membranes moist Neck: supple Cardiovascular: RRR without murmur. No JVD    Respiratory: CTA Bilaterally without wheezes or rales. Normal effort    GI: BS +, non-tender, non-distended  Extremities: No clubbing, cyanosis, or edema Skin: No evidence of breakdown, no evidence of rash, brownish scales 75m diameter no erythema, RUE and upper back, very sparse Neurologic: Cranial nerves II through XII intact, motor strength is 4/5 in bilateral deltoid, bicep, tricep, grip, hip flexor, knee extensors,5/5 left, trace RIght  ankle dorsiflexor and plantar flexor Sensory exam normal sensation to light touch and proprioception in bilateral upper and lower extremities Cerebellar exam normal finger to nose to finger as well as heel to shin in bilateral upper and lower extremities Musculoskeletal: CAM boot RLE in place. Neck with sl spasm/tenderness along upper  traps   Assessment/Plan: 1. Functional deficits secondary to Right trimalleolar fracture which require 3+ hours per day of interdisciplinary therapy in a comprehensive inpatient rehab setting.  Physiatrist is providing close team supervision and 24 hour management of active medical problems listed below.  Physiatrist and rehab team continue to assess barriers to discharge/monitor patient progress toward functional and medical goals  Care Tool:  Bathing  Bathing activity did not occur: (seated on BSC) Body parts bathed by patient: Right arm, Left arm, Chest, Abdomen, Front perineal area, Face   Body parts bathed by helper: Buttocks, Right lower leg, Left lower leg     Bathing assist Assist Level: Set up assist     Upper Body Dressing/Undressing Upper body dressing   What is the patient wearing?: Pull over shirt    Upper body assist Assist Level: Set up assist    Lower Body Dressing/Undressing Lower body dressing      What is the patient wearing?: Pants     Lower body assist Assist for lower body dressing: Maximal Assistance - Patient 25 - 49%     Toileting Toileting Toileting Activity did not occur (Landscape architectand hygiene only): Refused  Toileting assist Assist for toileting: Minimal Assistance - Patient > 75%     Transfers Chair/bed transfer  Transfers assist     Chair/bed transfer assist level: Minimal Assistance - Patient > 75%     Locomotion Ambulation   Ambulation assist   Ambulation activity did not occur: Safety/medical concerns  Walk 10 feet activity   Assist  Walk 10 feet activity did not occur: Safety/medical concerns        Walk 50 feet activity   Assist Walk 50 feet with 2 turns activity did not occur: Safety/medical concerns         Walk 150 feet activity   Assist Walk 150 feet activity did not occur: Safety/medical concerns         Walk 10 feet on uneven surface  activity   Assist Walk 10  feet on uneven surfaces activity did not occur: Safety/medical concerns         Wheelchair     Assist Will patient use wheelchair at discharge?: Yes Type of Wheelchair: Manual    Wheelchair assist level: Supervision/Verbal cueing Max wheelchair distance: 120    Wheelchair 50 feet with 2 turns activity    Assist        Assist Level: Supervision/Verbal cueing   Wheelchair 150 feet activity     Assist     Assist Level: Supervision/Verbal cueing    Medical Problem List and Plan: 1.  Functional deficits secondary to right trimalleolar ankle fracture and acute on chronic respiratory failure.            CIR PT, OT  Team conf in am 2.  Antithrombotics: -DVT/anticoagulation:  Pharmaceutical: Lovenox             -antiplatelet therapy: N/A. 3. Pain Management:  Controlled with prn tylenol and robaxin.             Monitor with increased mobility- check xray Left great toe, order cast shoe   Paresthesias the distribution is atypical for neuropathy  gabapentin resumed at 300 3 times daily 4. Mood: LCSW to follow for evaluation and support.              -antipsychotic agents: N/A Hx depression d/c Effexor, restart Celexa 5. Neuropsych: This patient is capable of making decisions on her own behalf. 6. Skin/Wound Care: Routine pressure relief measures. Skin area is not psoriasis, possible seborrhea, will f/u with derm as OP 7. Fluids/Electrolytes/Nutrition: Monitor I/O.    IVF stopped  -BUN sl increased, trending back up  -decrease lasix to 89m daily 8. H/o Lung cancer/COPD- O2 dependent: Currently on 6L HF oxygen--> weaned to 4L HF this afternoon.  Continue to wean supplemental oxygen as tolerated.  Continue Dulera bid with duonebs prn.  Resp virus testing (including SARS Covid 2 neg) ENc IS and Flutter valve 9. Chronic systolic CHF with pulmonary HTN: Monitor weights.  Continue Opsumit and Selexipag. On Lipitor.   -decrease lasix to 274mdaily per #7,11.  10. T2DM;   Continue Levemir and titrate as needed. Off Trulicity at this time. Will add meal coverage as at home and use SSI for elevated BS. Continue to monitor BS ac/hs.  Monitor with increased mobility. CBG (last 3)  Recent Labs    12/06/18 1645 12/06/18 2037 12/07/18 0630  GLUCAP 109* 191* 183*  -increase levemir to 22u QHS 5/25, will increase to 25U  11. CKD:  Continue to monitor renal status with serial checks. Has improved with SCr-1.22.  BUN sl elevated, Cr near baseline  -encourage fluids, decrease lasix 12. Chronic hypotension: Monitor BP tid. On midodrine with BP improving.  Monitor with increased mobility. 13. Acute on chronic iron deficiency anemia: Baseline Hgb in 9 range. Has improved past transfusion. Added iron supplement. Will continue to monitor.  -hgb 8.8 and c/w recent labs 14. Right trimalleolar  ankle fracture: NWB X 3 weeks. CAM at all times   -may loosen while in bed periodically for comfort  LOS: 6 days A FACE TO [place] E Terrina Docter 12/07/2018, 7:17 AM

## 2018-12-07 NOTE — Progress Notes (Signed)
   12/07/18 1200  Clinical Encounter Type  Visited With Patient  Visit Type Follow-up;Spiritual support  Referral From Other (Comment) Lattie Haw with inpatient rehab telephoned)  Consult/Referral To Chaplain  Spiritual Encounters  Spiritual Needs Literature   Responded to a called in spiritual care consult. PT request for communion. Upon entering room I reminded pt that there are no communion services given at this time, until further changes are made for outside clergy are able to visit for [address]. However, I gave PT a copy of the Bible and she was very thankful. I offered her words of encouragement and an empathic listening about care she gets from her grand kids. She states that she is looking forward to going home soon.  Chaplain Fidel Levy [phone]

## 2018-12-07 NOTE — Progress Notes (Signed)
Orthopedic Tech Progress Note Patient Details:  Amy Shepard [facility] Apr 23, 1946 [id number]  Ortho Devices Type of Ortho Device: Postop shoe/boot Ortho Device/Splint Location: left Ortho Device/Splint Interventions: Application   Post Interventions Patient Tolerated: Well Instructions Provided: Care of device   [name] 12/07/2018, 8:49 AM

## 2018-12-07 NOTE — Telephone Encounter (Signed)
Ok remove sutures

## 2018-12-07 NOTE — Telephone Encounter (Signed)
Reesa Chew calling asking if they can remove her sutures also states patient is asking about post op appt and xrays? (she's still inpatient) Pamela's cb# [phone]

## 2018-12-07 NOTE — Telephone Encounter (Signed)
Pt is s/p an ORIF right ankle fx 11/23/18. Inpatient at [facility]. PA asking if pt can have stitches  removed and when she should have follow up in office? And when she needs to have xrays.

## 2018-12-07 NOTE — Progress Notes (Signed)
Occupational Therapy Session Note  Patient Details  Name: Amy Shepard MRN: [id number] Date of Birth: April 28, 1946  Today's Date: 12/07/2018 OT Individual Time: 6067-7034 and 0352-4818 OT Individual Time Calculation (min): 70 min and 60 mins   Short Term Goals: Week 1:  OT Short Term Goal 1 (Week 1): Pt will perform toileting with min A overall . OT Short Term Goal 2 (Week 1): Pt will perform toilet transfer with min A while maintaining NWB precautions.  OT Short Term Goal 3 (Week 1): Pt will perform LB dressing with min A overall to increase I with self care.  Skilled Therapeutic Interventions/Progress Updates:    Session 1: Upon entering the room, pt reports, " I did not sleep well last night." Pt expressed concerns and upset that she has not discharged home yet. OT discussed pt's goals of Mod I and how she is not able to take care of herself independently and safely at this time needing further therapeutic intervention. OT asking pt to doff R LE CAM boot independently in which she needed over 20 minutes to complete. Once off, RN arrived and changed dressing. Pt donning CAM boot by propping onto bed in circle sitting position and again taking a very long time to don. Pt needing min cuing and encouragement to complete task.  OT discussed all the things pt needs to be able to do on her own at discharge with this being one of them. Plan made for afternoon session.  Pt remained in wheelchair with call bell and all needed items within reach.   Session 2: Upon entering the room, pt seated in wheelchair with no c/o pain and declines toileting this session. Pt on 6 L of O2 this session via [place]. Pt propelled wheelchair 100' towards day room with supervision and increased time. Focus of session being set up for transfers with use of slide board while maintain NWB. Pt given increased time and min cuing but able to set up wheelchair by moviing leg rests and arm rest. OT educated pt able importance for  safety with placing slide board. OT also placing force guard shoe under R foot to provide pt with feedback regarding weight bearing through R LE during transfer. Pt placing board herself and transferred to mat with min guard for safety. Pt returning back to wheelchair and placing board in wrong place and experienced sliding forward on board with therapist stopping pt for her to repositioned. Pt returning to wheelchair with min guard for balance and donned B LE leg rest. Pt propelled wheelchair to family room and utilized snack vending machine without assistance. Pt propelled self back to room in same manner. Call bell and all needed items within reach. Chair alarm activated.  Therapy Documentation Precautions:  Precautions Precautions: Fall Required Braces or Orthoses: Other Brace Other Brace: R CAM boot Restrictions Weight Bearing Restrictions: Yes RLE Weight Bearing: Non weight bearing Vital Signs: Therapy Vitals Pulse Rate: 75 Resp: 18 Oxygen Therapy SpO2: 94 % O2 Device: High Flow Nasal Cannula O2 Flow Rate (L/min): 6 L/min Pain: Pain Assessment Pain Scale: 0-10 Pain Score: 4  Pain Type: Acute pain Pain Location: Neck Pain Orientation: Posterior Pain Descriptors / Indicators: Aching;Discomfort Pain Frequency: Constant Pain Onset: On-going Patients Stated Pain Goal: 3 Pain Intervention(s): Medication (See eMAR)   Therapy/Group: Individual Therapy  Gypsy Decant 12/07/2018, 2:17 PM

## 2018-12-08 ENCOUNTER — Inpatient Hospital Stay (HOSPITAL_COMMUNITY): Payer: Medicare Other | Admitting: Physical Therapy

## 2018-12-08 ENCOUNTER — Inpatient Hospital Stay (HOSPITAL_COMMUNITY): Payer: Medicare Other

## 2018-12-08 ENCOUNTER — Encounter (HOSPITAL_COMMUNITY): Payer: Medicare Other | Admitting: Psychology

## 2018-12-08 ENCOUNTER — Inpatient Hospital Stay (HOSPITAL_COMMUNITY): Payer: Medicare Other | Admitting: Occupational Therapy

## 2018-12-08 LAB — GLUCOSE, CAPILLARY
Glucose-Capillary: 172 mg/dL — ABNORMAL HIGH (ref 70–99)
Glucose-Capillary: 169 mg/dL — ABNORMAL HIGH (ref 70–99)
Glucose-Capillary: 179 mg/dL — ABNORMAL HIGH (ref 70–99)
Glucose-Capillary: 229 mg/dL — ABNORMAL HIGH (ref 70–99)

## 2018-12-08 NOTE — Progress Notes (Signed)
Occupational Therapy Weekly Progress Note  Patient Details  Name: Amy Shepard MRN: [id number] Date of Birth: September 14, 1945  Beginning of progress report period: Dec 02, 2018 End of progress report period: Dec 08, 2018     Patient has met 0 of 3 short term goals.  Pt making slow progress this week towards occupational therapy goals. Pt's goals are set at wheelchair level with slide board transfers for safety and in order to maintain NWB status. Pt has been very inconsistent with transfers and most often is unable to maintain NWB when doing slide board or squat pivot transfer. Pt is also unable to tell when she is bearing weight and use of force guard shoe has been used to give pt feedback during transfers. Pt currently requires mod A for LB self care and min A/set up for UB self care. OT has recommended pt perform LB self care from bed level and UB self care at sink with set up A. Pt has been very emotional this week and lacks insight about what her capabilities currently are. Pt requires min - mod cuing for set up of equipment for transfers as well as management of O2.  Patient continues to demonstrate the following deficits: muscle weakness, decreased cardiorespiratoy endurance and decreased oxygen support and decreased sitting balance, decreased standing balance, decreased balance strategies and difficulty maintaining precautions and therefore will continue to benefit from skilled OT intervention to enhance overall performance with BADL and iADL.  Patient not progressing toward long term goals.  See goal revision..  Plan of care revisions: goals downgraded to S overall .  OT Short Term Goals Week 1:  OT Short Term Goal 1 (Week 1): Pt will perform toileting with min A overall . OT Short Term Goal 1 - Progress (Week 1): Not met OT Short Term Goal 2 (Week 1): Pt will perform toilet transfer with min A while maintaining NWB precautions.  OT Short Term Goal 2 - Progress (Week 1): Not met OT  Short Term Goal 3 (Week 1): Pt will perform LB dressing with min A overall to increase I with self care. OT Short Term Goal 3 - Progress (Week 1): Not met Week 2:  OT Short Term Goal 1 (Week 2): STGs=LTGs secondary to upcoming discharge date    Therapy Documentation Precautions:  Precautions Precautions: Fall Required Braces or Orthoses: Other Brace Other Brace: R CAM boot Restrictions Weight Bearing Restrictions: Yes RLE Weight Bearing: Non weight bearing General: General OT Amount of Missed Time: 45 Minutes Vital Signs: Therapy Vitals Pulse Rate: 74 Resp: 18 BP: (!) 111/53 Patient Position (if appropriate): Lying Oxygen Therapy SpO2: (!) 88 % O2 Device: Nasal Cannula O2 Flow Rate (L/min): 6 L/min Pain: Pain Assessment Pain Scale: 0-10 Pain Score: 2  Pain Type: Chronic pain Pain Location: Back Pain Orientation: Posterior Pain Descriptors / Indicators: Aching;Discomfort Pain Frequency: Constant Pain Onset: On-going Patients Stated Pain Goal: 2 Pain Intervention(s): Medication (See eMAR)   Gypsy Decant 12/08/2018, 11:25 AM

## 2018-12-08 NOTE — Progress Notes (Signed)
[place] PHYSICAL MEDICINE & REHABILITATION PROGRESS NOTE   Subjective/Complaints:  Left toe pain, indicates she broke it and Dr Sharol Given did surgery 2 years ago, review of Epic showed fixation for Lisfranc dislocation  Reviewed xrays results  Indicates pain is across the base of all 5 toes on Left foot  ROS: Patient denies  nausea, vomiting, diarrhea, cough, shortness of breath or chest pain,   Objective:   Dg Toe Great Left  Result Date: 12/07/2018 CLINICAL DATA:  Left toe pain. EXAM: LEFT GREAT TOE COMPARISON:  04/10/2018. FINDINGS: Previous screw fixation of the base of the first metatarsal bone with the first cuneiform and navicular bone. There is peri-screw lucency and mild cortical bone loss at the level of the base of first proximal metatarsal bone. There are 2 focal bone erosions with overhanging edges involving the head of the second and third metatarsal bones. The joint spaces appear well preserved. IMPRESSION: 1. No acute fracture identified. 2. Status post hardware fusion of the first metatarsal with the first cuneiform and navicular bone. Here, there is peri screw lucency and suspected loss of bone at the base of the first metatarsal bone. If there is a clinical concern for loosening or infection consider further investigation with three-phase bone scintigraphy. 3. Small bone erosions with overhanging edges involve the head of the second and third metatarsal bones. Correlation for any clinical signs or symptoms of inflammatory arthropathy including crystalline deposition disease. Electronically Signed   By: Kerby Moors M.D.   On: 12/07/2018 08:45   Recent Labs    12/06/18 0604  WBC 10.1  HGB 8.8*  HCT 29.5*  PLT 280   Recent Labs    12/06/18 0604  NA 138  K 4.4  CL 102  CO2 26  GLUCOSE 199*  BUN 34*  CREATININE 1.29*  CALCIUM 9.3    Intake/Output Summary (Last 24 hours) at 12/08/2018 0825 Last data filed at 12/07/2018 1700 Gross per 24 hour  Intake 420 ml   Output -  Net 420 ml     Physical Exam: Vital Signs Blood pressure (!) 111/53, pulse 74, temperature 98.5 F (36.9 C), resp. rate 18, height _0  (1.549 m), weight 80.5 kg, SpO2 (!) 88 %.   Constitutional: No distress . Vital signs reviewed. HEENT: EOMI, oral membranes moist Neck: supple Cardiovascular: RRR without murmur. No JVD    Respiratory: CTA Bilaterally without wheezes or rales. Normal effort    GI: BS +, non-tender, non-distended  Extremities: No clubbing, cyanosis, or edema Skin: No evidence of breakdown, no evidence of rash, brownish scales 56m diameter no erythema, RUE and upper back, very sparse Neurologic: Cranial nerves II through XII intact, motor strength is 4/5 in bilateral deltoid, bicep, tricep, grip, hip flexor, knee extensors,5/5 left, trace RIght  ankle dorsiflexor and plantar flexor Sensory exam normal sensation to light touch and proprioception in bilateral upper and lower extremities Cerebellar exam normal finger to nose to finger as well as heel to shin in bilateral upper and lower extremities Musculoskeletal: CAM boot RLE in place. Neck with sl spasm/tenderness along upper traps   Assessment/Plan: 1. Functional deficits secondary to Right trimalleolar fracture which require 3+ hours per day of interdisciplinary therapy in a comprehensive inpatient rehab setting.  Physiatrist is providing close team supervision and 24 hour management of active medical problems listed below.  Physiatrist and rehab team continue to assess barriers to discharge/monitor patient progress toward functional and medical goals  Care Tool:  Bathing  Bathing activity did  not occur: (seated on BSC) Body parts bathed by patient: Right arm, Left arm, Chest, Abdomen, Front perineal area, Face   Body parts bathed by helper: Buttocks, Right lower leg, Left lower leg     Bathing assist Assist Level: Set up assist     Upper Body Dressing/Undressing Upper body dressing   What is  the patient wearing?: Pull over shirt    Upper body assist Assist Level: Set up assist    Lower Body Dressing/Undressing Lower body dressing      What is the patient wearing?: Pants     Lower body assist Assist for lower body dressing: Maximal Assistance - Patient 25 - 49%     Toileting Toileting Toileting Activity did not occur Landscape architect and hygiene only): Refused  Toileting assist Assist for toileting: Minimal Assistance - Patient > 75%     Transfers Chair/bed transfer  Transfers assist     Chair/bed transfer assist level: Contact Guard/Touching assist     Locomotion Ambulation   Ambulation assist   Ambulation activity did not occur: Safety/medical concerns          Walk 10 feet activity   Assist  Walk 10 feet activity did not occur: Safety/medical concerns        Walk 50 feet activity   Assist Walk 50 feet with 2 turns activity did not occur: Safety/medical concerns         Walk 150 feet activity   Assist Walk 150 feet activity did not occur: Safety/medical concerns         Walk 10 feet on uneven surface  activity   Assist Walk 10 feet on uneven surfaces activity did not occur: Safety/medical concerns         Wheelchair     Assist Will patient use wheelchair at discharge?: Yes Type of Wheelchair: Manual    Wheelchair assist level: Supervision/Verbal cueing Max wheelchair distance: 100'    Wheelchair 50 feet with 2 turns activity    Assist        Assist Level: Supervision/Verbal cueing   Wheelchair 150 feet activity     Assist     Assist Level: Supervision/Verbal cueing    Medical Problem List and Plan: 1.  Functional deficits secondary to right trimalleolar ankle fracture and acute on chronic respiratory failure.            CIR PT, OT  Team conference today please see physician documentation under team conference tab, met with team face-to-face to discuss problems,progress, and goals.  Formulized individual treatment plan based on medical history, underlying problem and comorbidities. 2.  Antithrombotics: -DVT/anticoagulation:  Pharmaceutical: Lovenox             -antiplatelet therapy: N/A. 3. Pain Management:  Controlled with prn tylenol and robaxin.             Monitor with increased mobility- check xray Left great toe, order cast shoe   Paresthesias the distribution is atypical for neuropathy  gabapentin resumed at 300 3 times daily 4. Mood: LCSW to follow for evaluation and support.              -antipsychotic agents: N/A Hx depression d/c Effexor, restart Celexa 5. Neuropsych: This patient is capable of making decisions on her own behalf. 6. Skin/Wound Care: Routine pressure relief measures. Skin area is not psoriasis, possible seborrhea, will f/u with derm as OP 7. Fluids/Electrolytes/Nutrition: Monitor I/O.    IVF stopped  -BUN sl increased, trending back up  -decrease  lasix to 36m daily 8. H/o Lung cancer/COPD- O2 dependent: Currently on 6L HF oxygen--> weaned to 4L HF this afternoon.  Continue to wean supplemental oxygen as tolerated.  Continue Dulera bid with duonebs prn.  Resp virus testing (including SARS Covid 2 neg) ENc IS and Flutter valve 9. Chronic systolic CHF with pulmonary HTN: Monitor weights.  Continue Opsumit and Selexipag. On Lipitor.   -decrease lasix to 282mdaily per #7,11.  10. T2DM;  Continue Levemir and titrate as needed. Off Trulicity at this time. Will add meal coverage as at home and use SSI for elevated BS. Continue to monitor BS ac/hs.  Monitor with increased mobility. CBG (last 3)  Recent Labs    12/07/18 1702 12/07/18 2111 12/08/18 0625  GLUCAP 127* 197* 172*  -increase levemir to 22u QHS 5/25, will increase to 25U 5/27. Monitor response 11. CKD:  Continue to monitor renal status with serial checks. Has improved with SCr-1.22.  BUN sl elevated, Cr near baseline  -encourage fluids, decrease lasix 12. Chronic hypotension: Monitor  BP tid. On midodrine with BP improving.  Monitor with increased mobility. 13. Acute on chronic iron deficiency anemia: Baseline Hgb in 9 range. Has improved past transfusion. Added iron supplement. Will continue to monitor.  -hgb 8.8 and c/w recent labs 14. Right trimalleolar ankle fracture: NWB X 3 weeks. CAM at all times   -may loosen while in bed periodically for comfort  LOS: 7 days A [place]ACE [place]fer[place]anda Shepard 12/08/2018, 8:25 AM

## 2018-12-08 NOTE — Evaluation (Signed)
Recreational Therapy Assessment and Plan  Patient Details  Name: Amy Shepard MRN: [id number] Date of Birth: 25-Dec-1945 Today's Date: 12/08/2018  Rehab Potential: Good ELOS:   2 weeks  Assessment    Problem List:      Patient Active Problem List   Diagnosis Date Noted  . Trimalleolar fracture of ankle, closed, right, sequela 12/01/2018  . Hypoxia   . Acute on chronic anemia   . Closed dislocation of right talus   . Acute on chronic postoperative respiratory failure ([place])   . Postprocedural hypotension   . Trimalleolar fracture of ankle, closed, right, initial encounter   . Syndesmotic disruption of ankle, right, initial encounter   . Ankle fracture 11/22/2018  . Pain in joint, ankle and foot 04/10/2018  . Closed left fibular fracture 04/10/2018  . CKD (chronic kidney disease), stage IV ([place]) 04/09/2018  . Near syncope 04/09/2018  . Dyspnea   . Bronchitis, acute 03/11/2018  . Fatigue 03/11/2018  . Anemia due to stage 4 chronic kidney disease ([place]) 03/11/2018  . Pulmonary fibrosis ([place]) 03/11/2018  . Pulmonary HTN ([place]) 03/11/2018  . Anemia 03/11/2018  . Lisfranc dislocation, left, subsequent encounter 06/08/2017  . Chronic respiratory failure with hypoxia ([place]) 06/03/2017  . Falls 06/03/2017  . Hypokalemia 06/03/2017  . Sepsis secondary to UTI ([place]) 06/02/2017  . Dog bite 05/02/2017  . CHF exacerbation ([place]) 05/02/2017  . Acute diastolic CHF (congestive heart failure) ([place]) 05/02/2017  . Chronic diastolic CHF (congestive heart failure) ([place]) 04/02/2017  . Cor pulmonale ([place]) 04/02/2017  . Acute on chronic respiratory failure ([place]) 03/10/2017  . Chronic pain 03/10/2017  . Lactic acidosis 02/10/2017  . Acute respiratory failure with hypoxia ([place]) 02/09/2017  . Acute on chronic diastolic CHF (congestive heart failure) ([place]) 06/23/2016  . HTN (hypertension) 06/23/2016  . Type 2 diabetes mellitus with complication, with long-term current use of insulin  ([place]) 03/11/2016  . Depression 03/11/2016  . Chronic obstructive pulmonary disease ([place]) 03/10/2016  . S/P lumbar spinal fusion 02/22/2016  . Coronary artery disease involving native coronary artery 07/12/2014  . Pain in the chest   . Malignant neoplasm of lower lobe of right lung ([place]) 05/25/2014  . Lung cancer ([place]) 09/01/2012    Past Medical History:      Past Medical History:  Diagnosis Date  . Arthritis   . CAD (coronary artery disease)    a. Prior cath 2015 showed 40% prox AD, 50-50% mLAD, otherwise calcification but no obstruction in LCx/RCA.. Medical management recommended. b. 2017: low-risk NST.  [name] Chronic diastolic CHF (congestive heart failure) ([place])   . Chronic respiratory failure ([place])   . CKD (chronic kidney disease), stage III ([place])   . COPD (chronic obstructive pulmonary disease) ([place])   . Cor pulmonale ([name])    a. felt due to advanced COPD and noncompliance with O2.  . Depression   . Diabetes mellitus    [name]    dx  2008  . Fracture    left foot  . Hx of seasonal allergies   . Hypercholesteremia   . Hypertension    on no medications  . Lung cancer ([place])   . On home oxygen therapy    "2.5L; 24/7" (03/10/2017)  . Pericardial effusion    a. small-moderate in 07/2016.  . Pulmonary hypertension ([place])   . UTI (urinary tract infection)    Past Surgical History:       Past Surgical History:  Procedure Laterality Date  . APPLICATION OF WOUND VAC  Right 11/23/2018   Procedure: Application Of Wound Vac;  Surgeon: Duda, Marcus V, MD;  Location: [facility] OR;  Service: Orthopedics;  Laterality: Right;  . CARDIAC CATHETERIZATION     1995 or 96  [facility] [place]  . EYE SURGERY     bilateral cataracts  . LEFT HEART CATHETERIZATION WITH CORONARY ANGIOGRAM N/A 07/12/2014   Procedure: LEFT HEART CATHETERIZATION WITH CORONARY ANGIOGRAM;  Surgeon: Henry W Smith III, MD;  Location: [facility] CATH LAB;  Service: Cardiovascular;   Laterality: N/A;  . MAXIMUM ACCESS (MAS)POSTERIOR LUMBAR INTERBODY FUSION (PLIF) 2 LEVEL N/A 02/22/2016   Procedure: Lumbar three-four - Lumbar four-five  MAXIMUM ACCESS SURGERY  POSTERIOR LUMBAR INTERBODY FUSION;  Surgeon: David S Jones, MD;  Location: [facility] NEURO ORS;  Service: Neurosurgery;  Laterality: N/A;  . ORIF ANKLE FRACTURE Right 11/23/2018   Procedure: OPEN REDUCTION INTERNAL FIXATION (ORIF) RIGHT ANKLE FRACTURE;  Surgeon: Duda, Marcus V, MD;  Location: [facility] OR;  Service: Orthopedics;  Laterality: Right;  . ORIF TOE FRACTURE Left 06/12/2017   Procedure: OPEN REDUCTION INTERNAL FIXATION (ORIF) BASE 1ST METATARSAL (TOE) FRACTURE;  Surgeon: Duda, Marcus V, MD;  Location: [facility] OR;  Service: Orthopedics;  Laterality: Left;  . RIGHT HEART CATH N/A 08/06/2017   Procedure: RIGHT HEART CATH;  Surgeon: McLean, Dalton S, MD;  Location: [facility] INVASIVE CV LAB;  Service: Cardiovascular;  Laterality: N/A;  . THORACOTOMY Right 2010   lower  . TONSILLECTOMY      Assessment & Plan Clinical Impression: Patient is a 73 y.o. year old female withhistory of CAD, CKD, COPD with chronic hypoxic/hypercapnic respiratory failure- 4 L oxygen dependent, lung cancer, T2DM, chronic systolic CHF with pulmonary HTN, gait disorder who sustained a fall due to weakness, twisted her right ankle and had sudden onset of severe pain. History taken from chart review and patient. She was found to have trimalleolar right ankle fracture and underwent ORIF of right ankle on 11/23/2018 by Dr. Duda. To be nonweightbearing X 3 weeks with incisional VAC X one week.   Post op course complicated by hypotension and somnolence requiring pressors. PCCM consulted and patient treated with IV lasix, BIPAP and midodrine resumed as well as BDs as symptoms felt to be multifactorial due to anemia, fluid overload in setting of cor pulmonale, sleep related hypoventilation. She was found to have ABLA and was transfused 2 units PRBCs. Opsumit and  Selexipeg resumed, off BIPAP but has required HF oxygen now down to 6- 7L per [place] due to increased oxygen needs--pulmonary recommendations keeping SpO2 90-92%. Therapy ongoing and patient continues to be limited by weakness, fatigue and NWB status affecting mobility and ADLs. CIR recommended due to functional decline. Patient transferred to CIR on 12/01/2018 .    Pt presents with decreased activity tolerance, decreased functional mobility, decreased balance, decreased ability to maintain NWB precautions Limiting pt's independence with leisure/community pursuits.   Plan Min 1 TR session >20 minutes during LOS  Recommendations for other services: None   Discharge Criteria: Patient will be discharged from TR if patient refuses treatment 3 consecutive times without medical reason.  If treatment goals not met, if there is a change in medical status, if patient makes no progress towards goals or if patient is discharged from hospital.  The above assessment, treatment plan, treatment alternatives and goals were discussed and mutually agreed upon: by patient  SIMPSON,LISA 12/08/2018, 8:49 AM  

## 2018-12-08 NOTE — Plan of Care (Signed)
Goals downgraded to supervision overall for safety

## 2018-12-08 NOTE — Progress Notes (Signed)
Occupational Therapy Session Note  Patient Details  Name: Amy Shepard MRN: [id number] Date of Birth: 07-03-1946  Today's Date: 12/08/2018 OT Individual Time: 0700-0731 OT Individual Time Calculation (min): 31 min  and Today's Date: 12/08/2018 OT Missed Time: 80 Minutes Missed Time Reason: Patient fatigue   Short Term Goals: Week 1:  OT Short Term Goal 1 (Week 1): Pt will perform toileting with min A overall . OT Short Term Goal 2 (Week 1): Pt will perform toilet transfer with min A while maintaining NWB precautions.  OT Short Term Goal 3 (Week 1): Pt will perform LB dressing with min A overall to increase I with self care.  Skilled Therapeutic Interventions/Progress Updates:    Upon entering the room, pt supine in bed and sleeping soundly. Pt with c/o 8/10 pain in neck. RN notified and pain medication requested. Pt reports not sleeping well the last two days. OT discussing home set up and follow up with needing to be wheelchair level and increase independence while maintaining safety and precautions. OT recommended pt perform LB bathing and dressing from bed level or EOB as pt is unable to complete at sink without attempting to stand and bear weight. OT then suggested that after LB self care completed pt transfer into wheelchair and complete UB self care and grooming at sink in home environment. Pt was agreeable to this suggestion. Pt declined further activity secondary to fatigue and increased pain. OT repositioned pt in bed with call bell and all needed items within reach.   Therapy Documentation Precautions:  Precautions Precautions: Fall Required Braces or Orthoses: Other Brace Other Brace: R CAM boot Restrictions Weight Bearing Restrictions: Yes RLE Weight Bearing: Non weight bearing General: General OT Amount of Missed Time: 45 Minutes Vital Signs: Therapy Vitals Temp: 98.5 F (36.9 C) Pulse Rate: 74 Resp: 18 BP: (!) 122/51 Patient Position (if appropriate):  Lying Oxygen Therapy SpO2: (!) 87 % O2 Device: Nasal Cannula Pain: Pain Assessment Pain Scale: 0-10 Pain Score: 8  Pain Type: Chronic pain Pain Location: Back Pain Orientation: Posterior Pain Descriptors / Indicators: Aching;Discomfort Pain Frequency: Constant Pain Onset: On-going Patients Stated Pain Goal: 2 Pain Intervention(s): Medication (See eMAR)   Therapy/Group: Individual Therapy  Gypsy Decant 12/08/2018, 7:37 AM

## 2018-12-08 NOTE — Progress Notes (Signed)
Physical Therapy Session Note  Patient Details  Name: Amy Shepard MRN: [id number] Date of Birth: 06-08-1946  Today's Date: 12/08/2018 PT Individual Time: 1105-1205 PT Individual Time Calculation (min): 60 min   Short Term Goals: Week 1:  PT Short Term Goal 1 (Week 1): Pt will perform bed mobility with supervision assist consistently  PT Short Term Goal 2 (Week 1): Pt will performed SB transfer wit supervision assist PT Short Term Goal 3 (Week 1): Pt will propell WC 167f with supervision assist and no rest breaks  PT Short Term Goal 4 (Week 1): Pt will perform sit<>stand with min assist and maintain NWB with LRAD   Skilled Therapeutic Interventions/Progress Updates:     Patient in  w/c upon PT arrival. Patient alert and agreeable to PT session.  Therapeutic Activity: Transfers: Patient performed sit to/from stand x2 with min A. Provided verbal cues for NWB on R LE and reaching back before sitting. Attempted stand pivot transfer, however, patient complained of L knee pain when attempting to pivot on L foot and placed R foot down in attempts to pivot despite PT's cues for NWB on R. PT had patient sit down rather than complete stand pivot for safety.  Patient performed a level car transfer using a slide board with min A for the transfer and total A for set up with the board. Provided cues for board placement, hand placement, and head-hips relationship. O2 dropped to 82% following transfer, but recovered with pursed lip breathing in <1 min.   Wheelchair Mobility:  Patient propelled wheelchair 50 feet with supervision with B UEs, limited distance due to B shoulder pain/fatigue. Provided verbal cues for turning technique.  Patient in w/c at end of session with breaks locked, seat belt alarm set, and all needs within reach. Provided seated rest breaks between all activity due to decreased activity tolerance. Requires increased time to complete all mobility.  PT educated on energy  conservation techniques during session.    Therapy Documentation Precautions:  Precautions Precautions: Fall Required Braces or Orthoses: Other Brace Other Brace: R CAM boot Restrictions Weight Bearing Restrictions: Yes RLE Weight Bearing: Non weight bearing Pain: Patient denied pain throughout session.     Therapy/Group: Individual Therapy  Onur Mori L Hazeline Charnley PT, DPT  12/08/2018, 4:09 PM

## 2018-12-08 NOTE — Consult Note (Signed)
Neuropsychological Consultation   Patient:   Amy Shepard   DOB:   1946-04-09  MR Number:  [id number]  Location:  [facility] [id number] [place] [address] Dept: [place]: [phone]           Date of Service:   12/08/2018  Start Time:   3 PM End Time:   4 PM  Provider/Observer:  Ilean Skill, Psy.D.       Clinical Neuropsychologist       Billing Code/Service: [id number]  Chief Complaint:    Amy Shepard is a 73 year old female with history of CAD, CKD, COPD with chronic hypoxic/hypercapnic respiratory failure 4 L oxygen dependent, lung cancer, diabetes, chronic systolic CHF with pulmonary HTN, gait disorder who sustained a fall due to weakness, and suffered trimalleolar right ankle fracture and underwent ORIF of right ankle.  Nonweightbearing x3 weeks.  Patient referred for CIR due to functional decline.    Reason for Service:  HPI:  Amy Shepard is a 73 year old female with history of CAD, CKD, COPD with chronic hypoxic/hypercapnic respiratory failure- 4 L oxygen dependent, lung cancer, R4ER, chronic systolic CHF with pulmonary HTN, gait disorder who sustained a fall due to weakness, twisted her right ankle and had sudden onset of severe pain.  History taken from chart review and patient.  She was found to have trimalleolar right ankle fracture and underwent ORIF of right ankle on 11/23/2018 by Dr. Sharol Given. To be nonweightbearing X 3 weeks with incisional VAC X one week.   Post op course complicated by hypotension and somnolence requiring pressors. PCCM consulted and patient treated with IV lasix, BIPAP and midodrine resumed as well as BDs as symptoms felt to be multifactorial due to anemia, fluid overload in setting of cor pulmonale, sleep related hypoventilation. She was found to have ABLA and was transfused 2 units PRBCs. Opsumit and Selexipeg resumed,  off BIPAP  but has required HF oxygen now down to 6- 7L per [place] due to increased oxygen needs--pulmonary recommendations  keeping SpO2 90-92%. Therapy ongoing and patient continues to be limited by weakness, fatigue and NWB status affecting mobility and ADLs. CIR recommended due to functional decline.   Current Status:  Patient reports being very motivated and anxious about going home as soon as she can even though she has no one at home to help her during nonweightbearing period.  She claims it is due to concerns about money and cites past bills.  Denies significant anxiety or depression, but clearly has some agitation and worry both about her med situation and situation with family.  Did not talk much about issues with family but claimed they could not help her due to them working even though 3-4 family members around that could help at least at times.    Behavioral Observation: Amy Shepard  presents as a 73 y.o.-year-old Right Caucasian Female who appeared her stated age. her dress was Appropriate and she was Well Groomed and her manners were Appropriate to the situation.  her participation was indicative of Monopolizing and Resistant behaviors.  There were any physical disabilities noted.  she displayed an inappropriate level of cooperation and motivation.     Interactions:    Active Resistant  Attention:   within normal limits and attention span and concentration were age appropriate  Memory:   within normal limits; recent and remote memory intact  Visuo-spatial:  not examined  Speech (Volume):  normal  Speech:   normal; normal  Thought Process:  Coherent and Relevant  Though Content:  Rumination; not suicidal and not homicidal  Orientation:   person, place, time/date and situation  Judgment:   Fair  Planning:   Poor  Affect:    Blunted, Defensive and Irritable  Mood:    Dysphoric  Insight:   Fair  Intelligence:   normal  Medical History:   Past Medical History:  Diagnosis  Date  . Arthritis   . CAD (coronary artery disease)    a. Prior cath 2015 showed 40% prox AD, 50-50% mLAD, otherwise calcification but no obstruction in LCx/RCA.. Medical management recommended. b. 2017: low-risk NST.  [name] Chronic diastolic CHF (congestive heart failure) ([place])   . Chronic respiratory failure ([place])   . CKD (chronic kidney disease), stage III ([place])   . COPD (chronic obstructive pulmonary disease) ([place])   . Cor pulmonale ([name])    a. felt due to advanced COPD and noncompliance with O2.  . Depression   . Diabetes mellitus    [name]    dx  2008  . Fracture    left foot  . Hx of seasonal allergies   . Hypercholesteremia   . Hypertension    on no medications  . Lung cancer ([place])   . On home oxygen therapy    "2.5L; 24/7" (03/10/2017)  . Pericardial effusion    a. small-moderate in 07/2016.  . Pulmonary hypertension ([place])   . UTI (urinary tract infection)      Family Med/Psych History:  Family History  Problem Relation Age of Onset  . Diabetes Father   . Heart failure Father   . Cancer Sister   . Diabetes Brother   . Heart failure Brother     Risk of Suicide/Violence: low Patient denies SI or HI.  Impression/DX:  Amy Shepard is a 73 year old female with history of CAD, CKD, COPD with chronic hypoxic/hypercapnic respiratory failure 4 L oxygen dependent, lung cancer, diabetes, chronic systolic CHF with pulmonary HTN, gait disorder who sustained a fall due to weakness, and suffered trimalleolar right ankle fracture and underwent ORIF of right ankle.  Nonweightbearing x3 weeks.  Patient referred for CIR due to functional decline.    Patient reports being very motivated and anxious about going home as soon as she can even though she has no one at home to help her during nonweightbearing period.  She claims it is due to concerns about money and cites past bills.  Denies significant anxiety or depression, but clearly has some agitation and worry both about her med  situation and situation with family.  Did not talk much about issues with family but claimed they could not help her due to them working even though 3-4 family members around that could help at least at times.    Diagnosis:    Toe pain, chronic, left - Plan: DG Toe Great Left, DG Toe Great Left         Electronically Signed   _______________________ Ilean Skill, Psy.D.

## 2018-12-08 NOTE — Telephone Encounter (Signed)
Called and sw pamela to advise.  States that she did speak with shawn about this pt yesterday.

## 2018-12-08 NOTE — Progress Notes (Signed)
RT NOTE: Patient asked RT about a different option for her oxygen as HFNC ([place]) was too big and kept sliding out of her nose. RT placed patient on regular nasal cannula at 6L. Vitals are stable and sats are 93%. Patient says is much more comfortable. RT will continue to monitor.

## 2018-12-08 NOTE — Patient Care Conference (Signed)
Inpatient RehabilitationTeam Conference and Plan of Care Update Date: 12/08/2018   Time: 10:49 AM    Patient Name: Amy Shepard      Medical Record Number: [id number]  Date of Birth: 1945-11-10 Sex: Female         Room/Bed: 4W07C/4W07C-01 Payor Info: Payor: [employer] MEDICARE / Plan: [employer] MEDICARE / Product Type: *No Product type* /    Admitting Diagnosis: CVA 1 Team  Other [place], Rt ankle fracture; 12-14days  Admit Date/Time:  12/01/2018  2:52 PM Admission Comments: No comment available   Primary Diagnosis:  <principal problem not specified> Principal Problem: <principal problem not specified>  Patient Active Problem List   Diagnosis Date Noted  . Trimalleolar fracture of ankle, closed, right, sequela 12/01/2018  . Hypoxia   . Acute on chronic anemia   . Closed dislocation of right talus   . Acute on chronic postoperative respiratory failure ([place])   . Postprocedural hypotension   . Trimalleolar fracture of ankle, closed, right, initial encounter   . Syndesmotic disruption of ankle, right, initial encounter   . Ankle fracture 11/22/2018  . Pain in joint, ankle and foot 04/10/2018  . Closed left fibular fracture 04/10/2018  . CKD (chronic kidney disease), stage IV ([place]) 04/09/2018  . Near syncope 04/09/2018  . Dyspnea   . Bronchitis, acute 03/11/2018  . Fatigue 03/11/2018  . Anemia due to stage 4 chronic kidney disease ([place]) 03/11/2018  . Pulmonary fibrosis ([place]) 03/11/2018  . Pulmonary HTN ([place]) 03/11/2018  . Anemia 03/11/2018  . Lisfranc dislocation, left, subsequent encounter 06/08/2017  . Chronic respiratory failure with hypoxia ([place]) 06/03/2017  . Falls 06/03/2017  . Hypokalemia 06/03/2017  . Sepsis secondary to UTI ([place]) 06/02/2017  . Dog bite 05/02/2017  . CHF exacerbation ([place]) 05/02/2017  . Acute diastolic CHF (congestive heart failure) ([place]) 05/02/2017  . Chronic diastolic CHF (congestive heart failure) ([place]) 04/02/2017  . Cor pulmonale ([place])  04/02/2017  . Acute on chronic respiratory failure ([place]) 03/10/2017  . Chronic pain 03/10/2017  . Lactic acidosis 02/10/2017  . Acute respiratory failure with hypoxia ([place]) 02/09/2017  . Acute on chronic diastolic CHF (congestive heart failure) ([place]) 06/23/2016  . HTN (hypertension) 06/23/2016  . Type 2 diabetes mellitus with complication, with long-term current use of insulin ([place]) 03/11/2016  . Depression 03/11/2016  . Chronic obstructive pulmonary disease ([place]) 03/10/2016  . S/P lumbar spinal fusion 02/22/2016  . Coronary artery disease involving native coronary artery 07/12/2014  . Pain in the chest   . Malignant neoplasm of lower lobe of right lung ([place]) 05/25/2014  . Lung cancer ([place]) 09/01/2012    Expected Discharge Date: Expected Discharge Date: 12/14/18  Team Members Present: Physician leading conference: Dr. Alysia Penna Social Worker Present: Ovidio Kin, LCSW Nurse Present: Rayetta Pigg, RN PT Present: Leavy Cella, PT;Rosita Dechalus, PTA OT Present: Darleen Crocker, OT SLP Present: Stormy Fabian, SLP PPS Coordinator present : Ileana Ladd, PT     Current Status/Progress Goal Weekly Team Focus  Medical   Left toe pain , hx fx and fixation, RLE fracture with NWB, staples removed.  Breathing ok on supplemental O2  Maintain medical stability, wound healing, pain control  Eval and tx of Bilateral foot pain   Bowel/Bladder   Pt is continent of B/B. LBM 12/07/2018.  Maintain regular bowel pattern.  Assist with toileting needs PRN.   Swallow/Nutrition/ Hydration             ADL's   Setup UB self care, Max A  LB self care. Min A BSC transfers using slideboard. Max vcs to maintain NWB precautions   Mod I w/c level   Functional transfers, precaution adherence/maintaining stable 02 sats during functional activity, adaptive ADL/IADL skills   Mobility   supervision w/c mobility with increased time, minA to CGA SB transfer, minA  squat pivot and STS, pt unable to maintian  wt bearing precautions with any transfer activites   mod I w/c level   education on wt bearing precautions, SB and squat pivot transfers, d/c planning    Communication             Safety/Cognition/ Behavioral Observations            Pain   Pt complains of posterior neck pain of 7. Pain managed with Tylenol.  Pain < 3  Assess pain Q shift and PRN.   Skin   Pt has R ankle incision. Staples removed 12/07/2018.  Treat skin issues per orders to promote healing.  Assess skin Q shift and PRN.      *See Care Plan and progress notes for long and short-term goals.     Barriers to Discharge  Current Status/Progress Possible Resolutions Date Resolved   Physician    Medical stability;Wound Care     progressing toward goals  cont curren tmeds and therpay program       Nursing                  PT                    OT                  SLP                SW                Discharge Planning/Teaching Needs:  Home alone may or may not have intermittent assist from daughter. Pt will need to be mod/i wheelchair to go home safely      Team Discussion:  Goals will need to be downgraded to supervision due to inability to reach mod/i wheelchair level. Can't maintain her NWB and is puting weight on her leg. Left toe pain MD starting on voltaren gel. Sutures removed yesterday. Three more weeks of NWB. Limited supports via family, will be on her own at home  Revisions to Treatment Plan:  DC 6/2 versus NHP    Continued Need for Acute Rehabilitation Level of Care: The patient requires daily medical management by a physician with specialized training in physical medicine and rehabilitation for the following conditions: Daily direction of a multidisciplinary physical rehabilitation program to ensure safe treatment while eliciting the highest outcome that is of practical value to the patient.: Yes Daily medical management of patient stability for increased activity during participation in an intensive  rehabilitation regime.: Yes Daily analysis of laboratory values and/or radiology reports with any subsequent need for medication adjustment of medical intervention for : Pulmonary problems;Wound care problems;Other   I attest that I was present, lead the team conference, and concur with the assessment and plan of the team. Teleconference held due to COVID 19   Sherryann Frese, Gardiner Rhyme 12/08/2018, 10:49 AM

## 2018-12-08 NOTE — Progress Notes (Signed)
Social Work Patient ID: Amy Shepard, female   DOB: 04-Jun-1946, 73 y.o.   MRN: [id number] Met with pt to discuss team conference and the concerns the team has about her going home alone. Pt states: " It is what it is, I can take care of myself." Pt plans on going home and not to a facility, she will figure it out and make due until she is able to WB on her leg. Will find out the co-pay for a hospital bed and see if she can afford this. Will work toward discharge next Tuesday.

## 2018-12-08 NOTE — Progress Notes (Signed)
Physical Therapy Session Note  Patient Details  Name: Amy Shepard MRN: [id number] Date of Birth: 1945/08/06  Today's Date: 12/08/2018 PT Individual Time: [phone] and 1347-1430 PT Individual Time Calculation (min): 45 min and 43 min  Short Term Goals: Week 1:  PT Short Term Goal 1 (Week 1): Pt will perform bed mobility with supervision assist consistently  PT Short Term Goal 2 (Week 1): Pt will performed SB transfer wit supervision assist PT Short Term Goal 3 (Week 1): Pt will propell WC 141f with supervision assist and no rest breaks  PT Short Term Goal 4 (Week 1): Pt will perform sit<>stand with min assist and maintain NWB with LRAD   Skilled Therapeutic Interventions/Progress Updates: Tx1: Pt presented in w/c agreeable to therapy. Session focused on w/c parts management and SB transfers. Pt propelled to nsg station requiring x 1 rest break with PTA transporting remaining distance to rehab gym. LLattie Haw RT present throughout session.  Pt able to pull up to mat at 45 degree angle, then pt indicating feels removing leg rests "more trouble than it's worth". Discussed with pt current position not feasible as furniture would block leg rests, discussed placing w/c parallel to mat which pt was able to do. Pt required increased time and verbal cues for SB placement and was able to perform SB transfer to mat with CGA on "level" surface. Pt required modA for SB placement to initiate transfer back to w/c. Pt required increased time and cues for BLE management. Pt transported back to room at end of session and left in w/c with belt alarm on and needs met.   Tx2:  Pt presented in w/c agreeable to therapy. Pt indicating unable to d/c 6/2 as will not have family to provide transportation as all family members will be working and unable to take time off. Pt then transported to ADL apt and performed SB transfer to couch. Pt able to remove leg rests and align w/c to couch without assist and cues. Pt then  able to set up SB and performed "downhill" transfer with CGA requiring minA to remove SB. Pt then tried place SB but required minA due to pants getting caught on corner of SB. Once SB placed correctly pt was able to perform transfer with CGA and increased time. Pt made several attempts to place ELR but ultimately required minA. Pt required increased time with activity due to increased exertion. Pt transported back to room and left with seat alarm on, call bell within reach and needs met.      Therapy Documentation Precautions:  Precautions Precautions: Fall Required Braces or Orthoses: Other Brace Other Brace: R CAM boot Restrictions Weight Bearing Restrictions: Yes RLE Weight Bearing: Non weight bearing General:   Vital Signs:  Pain: Pain Assessment Pain Score: 2    Therapy/Group: Individual Therapy  Pamila Mendibles  Malaak Stach, PTA  12/08/2018, 12:27 PM

## 2018-12-09 ENCOUNTER — Inpatient Hospital Stay (HOSPITAL_COMMUNITY): Payer: Medicare Other | Admitting: Physical Therapy

## 2018-12-09 ENCOUNTER — Inpatient Hospital Stay (HOSPITAL_COMMUNITY): Payer: Medicare Other | Admitting: Occupational Therapy

## 2018-12-09 ENCOUNTER — Inpatient Hospital Stay (HOSPITAL_COMMUNITY): Payer: Medicare Other

## 2018-12-09 ENCOUNTER — Telehealth: Payer: Self-pay | Admitting: Pulmonary Disease

## 2018-12-09 DIAGNOSIS — R0609 Other forms of dyspnea: Secondary | ICD-10-CM

## 2018-12-09 DIAGNOSIS — I2729 Other secondary pulmonary hypertension: Secondary | ICD-10-CM

## 2018-12-09 DIAGNOSIS — J9621 Acute and chronic respiratory failure with hypoxia: Secondary | ICD-10-CM

## 2018-12-09 LAB — GLUCOSE, CAPILLARY
Glucose-Capillary: 175 mg/dL — ABNORMAL HIGH (ref 70–99)
Glucose-Capillary: 206 mg/dL — ABNORMAL HIGH (ref 70–99)
Glucose-Capillary: 135 mg/dL — ABNORMAL HIGH (ref 70–99)
Glucose-Capillary: 143 mg/dL — ABNORMAL HIGH (ref 70–99)
Glucose-Capillary: 116 mg/dL — ABNORMAL HIGH (ref 70–99)

## 2018-12-09 MED ORDER — IPRATROPIUM-ALBUTEROL 0.5-2.5 (3) MG/3ML IN SOLN
3.0000 mL | Freq: Four times a day (QID) | RESPIRATORY_TRACT | Status: DC
  Administered 2018-12-09 – 2018-12-11 (×7): 3 mL via RESPIRATORY_TRACT
  Filled 2018-12-09 (×9): qty 3

## 2018-12-09 MED ORDER — INSULIN DETEMIR 100 UNIT/ML ~~LOC~~ SOLN
25.0000 [IU] | Freq: Every day | SUBCUTANEOUS | Status: DC
  Administered 2018-12-09: 22:00:00 25 [IU] via SUBCUTANEOUS
  Filled 2018-12-09 (×3): qty 0.25

## 2018-12-09 MED ORDER — FUROSEMIDE 10 MG/ML IJ SOLN
20.0000 mg | Freq: Once | INTRAMUSCULAR | Status: AC
  Administered 2018-12-09: 16:00:00 20 mg via INTRAVENOUS
  Filled 2018-12-09: qty 2

## 2018-12-09 NOTE — Progress Notes (Signed)
Physical Therapy Session Note  Patient Details  Name: Amy Shepard MRN: [id number] Date of Birth: 09-08-1945  Today's Date: 12/09/2018 PT Individual Time: 0903-0930 PT Individual Time Calculation (min): 27 min   Short Term Goals: Week 1:  PT Short Term Goal 1 (Week 1): Pt will perform bed mobility with supervision assist consistently  PT Short Term Goal 2 (Week 1): Pt will performed SB transfer wit supervision assist PT Short Term Goal 3 (Week 1): Pt will propell WC 168f with supervision assist and no rest breaks  PT Short Term Goal 4 (Week 1): Pt will perform sit<>stand with min assist and maintain NWB with LRAD   Skilled Therapeutic Interventions/Progress Updates:      Therapy Documentation Precautions:  Precautions Precautions: Fall Required Braces or Orthoses: Other Brace Other Brace: R CAM boot Restrictions Weight Bearing Restrictions: Yes RLE Weight Bearing: Non weight bearing   Treatment: Pt seated in wheelchair on arrival to room. With encouragement after stating "don't you guys ever take a day off", agreed to PT. Denies any pain. Does state she is cold and asked for a blanket. Blanket provided.  SaO2 checked as it was noted in chart pt had been running low at times. SaO2 90% on 5 lpm via [place]. Cues for pursed lip breathing (PLB) with increase >/=93%. Worked on wheelchair mobility/features by having pt disengage breaks and self propel for 175 feet, including one 180 degree turn with frequent rest breaks. Kept pt on 6 lpm of O2 (canister does not offer 5 lpm as an option). Cues for use of bil UE's together for energy conservation. Back in room had pt self lock breaks and returned pt to 5 lpm. SaO2. SaO2 checked and found to be 80%. With cues for PLB increased to >/=90% after 1-2 minutes, and >/=92% after another 1-2 minutes. Worked on LE strengthening: long arc quads and hip abduction/abduction for 10 reps each with assist for controlled movements. SaO2 85% after ex's and  recovered to >/= 90% after ~1 minute of PLB.   Pt left in wheelchair with all needs in reach. Decreased activity tolerance today due to desaturations with activity. RN notified and is aware. Pt able to participate in session and followed all directions.   Therapy/Group: Individual Therapy  BLindon Romp PTA, CLT 12/09/18, 7:57 PM

## 2018-12-09 NOTE — Progress Notes (Deleted)
12/09/2018 0715: RN was instructed by Joellen Jersey, OT on 12/08/2018 0700 that pt's cam boot could be removed HS so that the RLE could "breathe". RN took off cam boot on 12/09/2018 at 2230 so that RLE could "breathe" per OT instructions. On 12/09/2018 at 34, Joellen Jersey, [place] asks RN "how long has the cam boot been off?" RN replies, "since 10:30 pm per your instructions to let the right lower extremity 'breathe'." OT states that the cam boot should not be removed for the entire night because the patient has an unstable fracture. RN will seek clarification from MD about cam boot removal HS. Pt is sitting at the bedside and eating breakfast. Pt is asymptomatic with OT present at bedside. RN will continue to monitor.

## 2018-12-09 NOTE — Progress Notes (Signed)
..   NAME:  Amy Shepard, MRN:  [id number], DOB:  [birth year], LOS: 8 ADMISSION DATE:  12/01/2018, CONSULTATION DATE:  11/23/2018. 12/09/18 REFERRING MD:  DUDA MD, CHIEF COMPLAINT:  Hypoxia  Brief History   73 yo female presented with ankle pain and found to have distal fibular and medial malleolar fractures.  She had ORIF on 5/12.  She was hypotensive and somnolent post op, and was started on dopamine infusion.  She is followed by cardiology for pulmonary hypertension on opsumit and selexipeg.  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   Consults:  Ortho PMR NeuroPsych PCCM  Procedures:    Significant Diagnostic Tests:  PFT 09/11/16 >> FEV1 1.45 (72%), FEV1% 87, TLC 2.95 (64%), DLCO 31% RHC 08/06/17 >> normal filling pressures, PVR 4.7, preserved CO PSG 08/27/17 >> AHI 3.8, SpO2 low 79% Echo 04/10/18 >> mild LVH, EF 55 to 60%, grade 1 DD, PAS 81 mmHg CT angio chest 08/18/18 >> atherosclerosis, small Rt effusion, b/l mosaic attentuation  Micro Data:  COVID 5/11 >> negative  Antimicrobials:   Interim history/subjective:   Frustrated this morning with her O2 management at present.  I personally assessed the patient's SpO2 with a finding of 97% on 6LNC. I turned down her O2 to her home 4LNC and SpO2 response was 93%.   Objective   BP (!) 123/56 (BP Location: Right Arm)   Pulse 77   Temp 97.9 F (36.6 C)   Resp 18   Ht _0  (1.549 m)   Wt 80.5 kg   SpO2 92%   BMI 33.52 kg/m         Intake/Output Summary (Last 24 hours) at 12/09/2018 5621 Last data filed at 12/09/2018 0700 Gross per 24 hour   Intake 600 ml  Output -  Net 600 ml   Filed Weights   12/06/18 1331  Weight: 80.5 kg    Examination:  General -Chronically ill appearing, elderly F, seated in NAD  ENT -NCAT. Pink mmm. Anicteric sclera. Trachea midline. Patient nares with [place] in place  Cardiac - RRR s1s2 no rgm Chest - decreased BS, no wheeze Abdomen - Soft, round ndnt Extremities - No peripheral edema. No cyanosis, no clubbing.  Skin - Clean, dry, warm, without rash  Neuro -AAOx4  PERRL Following commands    Assessment & Plan:   Acute on chronic hypoxic and hypercarbic respiratory failure History of Lung Ca, s.p chemotherapy in 2010 -Has been seen by Dr. Vaughan Browner  in 2018,prescribed Advair, endorsed continuing supplemental O2, and referred patient for R heart cath and sleep study (suspecting OSA) -Moderate restrictive lung disease and mild obstruction on PFTs March, 2018 -It appears patient has required supplemental O2 up to 6L in outpatient setting. -Most recently patient states she uses 4LNC  Plan -Titrate Supplemental O2 for SpO2 goal 88-92% -O2 weaned from [facility] to [facility] due to SpO2 97% on 6LNC at time of exam.  - Mobility as tolerated  - Perform ambulatory SpO2 to assess SpO2 need during activity.  -Continue DuoNebs  -Flutter Valve q2hrs, IS q4hr   -Follow up CXR  -Recommend outpatient follow-up with Pulmonology 2 weeks after discharge   WHO group 2 and 3  pulmonary HTN with ?WHO group 1 pulmonary HTN. Seen by CHF team outpatient  Plan - Continue Opsumit and selexipag     Thank you for consulting PCCM. At this time we will sign off. Please re-engage if needed.   Best practice:  Diet: heart healthy/carb modified DVT prophylaxis: lovenox  GI prophylaxis: n/a Mobility: PT Code Status: Full  Disposition: Rehab     Eliseo Gum MSN, AGACNP-BC [place] [phone] If no answer, [phone] 12/09/2018, 9:38 AM

## 2018-12-09 NOTE — Progress Notes (Signed)
Occupational Therapy Session Note  Patient Details  Name: Amy Shepard MRN: [id number] Date of Birth: Nov 05, 1945  Today's Date: 12/09/2018 OT Individual Time: [id number] and 2824-1753 OT Individual Time Calculation (min): 70 min and 42 minutes 18 missed minutes in afternoon session secondary to pulmonary MD arrival for assessment  Short Term Goals: Week 2:  OT Short Term Goal 1 (Week 2): STGs=LTGs secondary to upcoming discharge date  Skilled Therapeutic Interventions/Progress Updates:   Session 1: Upon entering the room, pt seated on EOB eating breakfast without CAM boot donned. Pt's dressing is wet on R ankle and OT changing dressing and providing total A to pt by donning R CAM boot. RN notified. Pt reporting need to use bathroom. OT set up equipment and pt refused slide board to drop arm commode chair but was agreeable to attempt squat pivot. Pt unable to follow commands and stands up from bed and bearing weight on R LE. Pt sitting back down and required mod A squat pivot transfer onto drop arm commode chair. Pt needing assistance with clothing management and pt was incontinent of BM. Pt was able to perform hygiene while seated with set up to obtain and min guard for safety as pt leans forward to complete. Pt verbalized feeling nauseated with BM and RN notified. Blood glucose checked and pt appearing to be diaphoretic. O2 saturation while on 6 L desats into the 60's. OT transferred pt with mod A into wheelchair with squat pivot and RN assessing pt further as OT exited the room. Pt taking several minutes to return to 90% on 6 L.  Session 2: Upon entering the room, pt seated in wheelchair and wrapped in blanket. Pt reports not feeling well and concerned over x ray taken today. Pt currently on 4 L of O2 via [place] at 79%. Pt taking over 4 minutes to recover to 90% on 4 L. Pt requesting to attempt UB exercises and performed 10 reps of chest pulls with level 1 theraband on 6 L of O2. Pt's oxygen  decreased to 78% and needing 2 minutes to increase to 95%. O2 changed to 5 L and O2 just discussed pt's goals and discharge planning for 7 minutes . O2 taken during this discussion ranging from 83% - 88%. MD from pulmonary arrived to assess pt and this therapist shared information from this session. Pt remained in wheelchair with all needs within reach and MD in room.    Therapy Documentation Precautions:  Precautions Precautions: Fall Required Braces or Orthoses: Other Brace Other Brace: R CAM boot Restrictions Weight Bearing Restrictions: Yes RLE Weight Bearing: Non weight bearing General: General PT Missed Treatment Reason: Xray Vital Signs: Oxygen Therapy SpO2: (!) 88 % O2 Device: Nasal Cannula O2 Flow Rate (L/min): 6 L/min Patient Activity (if Appropriate): Other (Comment)(3 minutes rest sitting up in bed) Pulse Oximetry Type: Intermittent   Therapy/Group: Individual Therapy  Gypsy Decant 12/09/2018, 12:50 PM

## 2018-12-09 NOTE — Progress Notes (Signed)
Patient weaned to 5L [place], patient in no distress, vitals are stable will continue to monitor patient.

## 2018-12-09 NOTE — Progress Notes (Signed)
Social Work Patient ID: Amy Shepard, female   DOB: March 29, 1946, 73 y.o.   MRN: [id number]    Diagnosis code:S82.851S, C34.31, I50.32  Height: 5'1  Weight: 177 lbs   Patient has Right ankle, COPD, CHF, HX Lung Cancer which requires her upper and lower extremities to be positioned in ways not feasible with a normal bed.  Head must be elevated at least 30% or legs elevated. Her upper trunk and lower leg  requires frequent and immediate changes in body position which cannot be achieved with a normal bed.

## 2018-12-09 NOTE — Progress Notes (Signed)
Physical Therapy Session Note  Patient Details  Name: Amy Shepard MRN: [id number] Date of Birth: 10/08/1945  Today's Date: 12/09/2018 PT Individual Time: 1000-1030 PT Individual Time Calculation (min): 30 min   Short Term Goals: Week 1:  PT Short Term Goal 1 (Week 1): Pt will perform bed mobility with supervision assist consistently  PT Short Term Goal 2 (Week 1): Pt will performed SB transfer wit supervision assist PT Short Term Goal 3 (Week 1): Pt will propell WC 143f with supervision assist and no rest breaks  PT Short Term Goal 4 (Week 1): Pt will perform sit<>stand with min assist and maintain NWB with LRAD   Skilled Therapeutic Interventions/Progress Updates:   Pt received sitting in WC and agreeable to PT, with significant encouragement. PT set up pt for WC mobility and measured SpO2 on pt sitting in WC on 4L/min supplemental O2 99%. Pt transported then present to take pt to xray SB transfer bed with Set up assist from PT and cues for NWB. PT assessed pt O2 once in bed 69% on 4L/min and then increased to 6L/min and 3 minutes to increase to >88%.  Transport then removed pt in bed to go to xray.        Therapy Documentation Precautions:  Precautions Precautions: Fall Required Braces or Orthoses: Other Brace Other Brace: R CAM boot Restrictions Weight Bearing Restrictions: Yes RLE Weight Bearing: Non weight bearing General: PT Amount of Missed Time (min): 30 Minutes PT Missed Treatment Reason: Xray Vital Signs: Therapy Vitals Pulse Rate: 77 BP: (!) 123/56 Patient Position (if appropriate): Sitting Oxygen Therapy SpO2: (!) 88 % O2 Device: Nasal Cannula O2 Flow Rate (L/min): 6 L/min Patient Activity (if Appropriate): Other (Comment)(3 minutes rest sitting up in bed) Pulse Oximetry Type: Intermittent Pain: Pain Assessment Pain Scale: 0-10 Pain Score: 7  Pain Type: Acute pain Pain Location: Neck Pain Orientation: Posterior Pain Descriptors / Indicators:  Aching Pain Onset: With Activity Patients Stated Pain Goal: 2 Pain Intervention(s): Medication (See eMAR)(tylenol and robaxin given) Multiple Pain Sites: No    Therapy/Group: Individual Therapy  ALorie Phenix5/28/2020, 10:58 AM

## 2018-12-09 NOTE — Telephone Encounter (Signed)
Staff message received from Dr. Loanne Drilling:  Please schedule patient for follow-up visit in 2 weeks  Received: Today  Message Contents  Amy Seeds, MD  P L[facility] Triage Pool   Pt is a pt of Dr. Vaughan Browner, last seen at office 09/11/16. Pt had COVID test done while at hospital and it was negative. Pt is now doing PT after recent ankle fracture.  Called and spoke with pt letting her know that we needed to get her scheduled for a follow up in two weeks and pt expressed understanding. Pt has been scheduled for HFU with Derl Barrow, NP 6/10 at 2pm. Pt was given new office address. Nothing further needed.

## 2018-12-09 NOTE — Plan of Care (Signed)
  Problem: RH SKIN INTEGRITY Goal: RH STG SKIN FREE OF INFECTION/BREAKDOWN Description Free of skin breakdown and infection with mod I assist  Outcome: Progressing Goal: RH STG ABLE TO PERFORM INCISION/WOUND CARE W/ASSISTANCE Description STG Able To Perform Incision/Wound Care With Assistance. Mod I   Outcome: Progressing   Problem: RH SAFETY Goal: RH STG ADHERE TO SAFETY PRECAUTIONS W/ASSISTANCE/DEVICE Description STG Adhere to Safety Precautions With Assistance/Device. Mod I  Outcome: Progressing   Problem: RH PAIN MANAGEMENT Goal: RH STG PAIN MANAGED AT OR BELOW PT'S PAIN GOAL Description Less than 3  Outcome: Progressing   Problem: RH KNOWLEDGE DEFICIT GENERAL Goal: RH STG INCREASE KNOWLEDGE OF SELF CARE AFTER HOSPITALIZATION Description Patient will be able to describe medication management and wound management with mod I assistance  Outcome: Progressing

## 2018-12-09 NOTE — Progress Notes (Signed)
[place] PHYSICAL MEDICINE & REHABILITATION PROGRESS NOTE   Subjective/Complaints:  No issues overnnite but had desat into the 60s this am  Resp changed from 6L high flow to 6 L regular flow yesterday   ROS: Patient denies  nausea, vomiting, diarrhea, cough, shortness of breath or chest pain,   Objective:   Dg Toe Great Left  Result Date: 12/07/2018 CLINICAL DATA:  Left toe pain. EXAM: LEFT GREAT TOE COMPARISON:  04/10/2018. FINDINGS: Previous screw fixation of the base of the first metatarsal bone with the first cuneiform and navicular bone. There is peri-screw lucency and mild cortical bone loss at the level of the base of first proximal metatarsal bone. There are 2 focal bone erosions with overhanging edges involving the head of the second and third metatarsal bones. The joint spaces appear well preserved. IMPRESSION: 1. No acute fracture identified. 2. Status post hardware fusion of the first metatarsal with the first cuneiform and navicular bone. Here, there is peri screw lucency and suspected loss of bone at the base of the first metatarsal bone. If there is a clinical concern for loosening or infection consider further investigation with three-phase bone scintigraphy. 3. Small bone erosions with overhanging edges involve the head of the second and third metatarsal bones. Correlation for any clinical signs or symptoms of inflammatory arthropathy including crystalline deposition disease. Electronically Signed   By: Kerby Moors M.D.   On: 12/07/2018 08:45   No results for input(s): WBC, HGB, HCT, PLT in the last 72 hours. No results for input(s): NA, K, CL, CO2, GLUCOSE, BUN, CREATININE, CALCIUM in the last 72 hours.  Intake/Output Summary (Last 24 hours) at 12/09/2018 0726 Last data filed at 12/08/2018 1900 Gross per 24 hour  Intake 360 ml  Output -  Net 360 ml     Physical Exam: Vital Signs Blood pressure (!) 130/58, pulse 78, temperature 97.9 F (36.6 C), resp. rate 18,  height _0  (1.549 m), weight 80.5 kg, SpO2 94 %.   Constitutional: No distress . Vital signs reviewed. HEENT: EOMI, oral membranes moist Neck: supple Cardiovascular: RRR without murmur. No JVD    Respiratory: CTA Bilaterally without wheezes or rales. Normal effort   Unchanged  GI: BS +, non-tender, non-distended  Extremities: No clubbing, cyanosis, or edema Skin: No evidence of breakdown, no evidence of rash, brownish scales 48m diameter no erythema, RUE and upper back, very sparse Neurologic: Cranial nerves II through XII intact, motor strength is 4/5 in bilateral deltoid, bicep, tricep, grip, hip flexor, knee extensors,5/5 left, trace RIght  ankle dorsiflexor and plantar flexor Sensory exam normal sensation to light touch and proprioception in bilateral upper and lower extremities Cerebellar exam normal finger to nose to finger as well as heel to shin in bilateral upper and lower extremities Musculoskeletal: CAM boot RLE in place. Neck with sl spasm/tenderness along upper traps   Assessment/Plan: 1. Functional deficits secondary to Right trimalleolar fracture which require 3+ hours per day of interdisciplinary therapy in a comprehensive inpatient rehab setting.  Physiatrist is providing close team supervision and 24 hour management of active medical problems listed below.  Physiatrist and rehab team continue to assess barriers to discharge/monitor patient progress toward functional and medical goals  Care Tool:  Bathing  Bathing activity did not occur: (seated on BSC) Body parts bathed by patient: Right arm, Left arm, Chest, Abdomen, Front perineal area, Face   Body parts bathed by helper: Buttocks, Right lower leg, Left lower leg     Bathing assist  Assist Level: Set up assist     Upper Body Dressing/Undressing Upper body dressing   What is the patient wearing?: Pull over shirt    Upper body assist Assist Level: Set up assist    Lower Body Dressing/Undressing Lower body  dressing      What is the patient wearing?: Pants     Lower body assist Assist for lower body dressing: Maximal Assistance - Patient 25 - 49%     Toileting Toileting Toileting Activity did not occur Landscape architect and hygiene only): Refused  Toileting assist Assist for toileting: Minimal Assistance - Patient > 75%     Transfers Chair/bed transfer  Transfers assist     Chair/bed transfer assist level: Contact Guard/Touching assist     Locomotion Ambulation   Ambulation assist   Ambulation activity did not occur: Safety/medical concerns          Walk 10 feet activity   Assist  Walk 10 feet activity did not occur: Safety/medical concerns        Walk 50 feet activity   Assist Walk 50 feet with 2 turns activity did not occur: Safety/medical concerns         Walk 150 feet activity   Assist Walk 150 feet activity did not occur: Safety/medical concerns         Walk 10 feet on uneven surface  activity   Assist Walk 10 feet on uneven surfaces activity did not occur: Safety/medical concerns         Wheelchair     Assist Will patient use wheelchair at discharge?: Yes Type of Wheelchair: Manual    Wheelchair assist level: Supervision/Verbal cueing Max wheelchair distance: 38'    Wheelchair 50 feet with 2 turns activity    Assist        Assist Level: Supervision/Verbal cueing   Wheelchair 150 feet activity     Assist     Assist Level: Supervision/Verbal cueing    Medical Problem List and Plan: 1.  Functional deficits secondary to right trimalleolar ankle fracture and acute on chronic respiratory failure.            CIR PT, OT  Emphasis is on maintaining NWB RLE, pt noncompliant with WB restriction 2.  Antithrombotics: -DVT/anticoagulation:  Pharmaceutical: Lovenox             -antiplatelet therapy: N/A. 3. Pain Management:  Controlled with prn tylenol and robaxin.             Monitor with increased mobility-  check xray Left great toe, order cast shoe   Paresthesias the distribution is atypical for neuropathy  gabapentin resumed at 300 3 times daily 4. Mood: LCSW to follow for evaluation and support.              -antipsychotic agents: N/A Hx depression d/c Effexor, restart Celexa 5. Neuropsych: This patient is capable of making decisions on her own behalf. 6. Skin/Wound Care: Routine pressure relief measures. Skin area is not psoriasis, possible seborrhea, will f/u with derm as OP 7. Fluids/Electrolytes/Nutrition: Monitor I/O.    IVF stopped  -BUN sl increased, trending back up  -decrease lasix to 29m daily 8. H/o Lung cancer/COPD- O2 dependent: Currently on 6L HF oxygen--> weaned to 4L HF this afternoon.  Continue to wean supplemental oxygen as tolerated.  Continue Dulera bid with duonebs prn.  Resp virus testing (including SARS Covid 2 neg) ENc IS and Flutter valve 9. Chronic systolic CHF with pulmonary HTN: Monitor weights.  Continue  Opsumit and Selexipag. On Lipitor.   -decrease lasix to 89m daily per #7,11.  10. T2DM;  Continue Levemir and titrate as needed. Off Trulicity at this time. Will add meal coverage as at home and use SSI for elevated BS. Continue to monitor BS ac/hs.  Monitor with increased mobility. CBG (last 3)  Recent Labs    12/08/18 1657 12/08/18 2125 12/09/18 0651  GLUCAP 179* 229* 175*  -increase levemir to 22u QHS 5/25, will increase to 25U 5/28 11. CKD:  Continue to monitor renal status with serial checks. Has improved with SCr-1.22.  BUN sl elevated, Cr near baseline  -encourage fluids, decrease lasix 12. Chronic hypotension: Monitor BP tid. On midodrine with BP improving.  Monitor with increased mobility. 13. Acute on chronic iron deficiency anemia: Baseline Hgb in 9 range. Has improved past transfusion. Added iron supplement. Will continue to monitor.  -hgb 8.8 and c/w recent labs 14. Right trimalleolar ankle fracture: NWB X 3 weeks. CAM at all times   -may  loosen while in bed periodically for comfort  [place]: [place] Caidyn Henricksen 12/09/2018, 7:26 AM

## 2018-12-09 NOTE — Progress Notes (Signed)
Social Work Patient ID: Amy Shepard, female   DOB: 12-03-1945, 73 y.o.   MRN: [id number]  Pt had mentioned yesterday she had wanted to leave Sunday due to ride home. Asked MD today he reports she has medical issues and will be able to discharged Sunday. Will work on discharge needs.

## 2018-12-09 NOTE — Progress Notes (Signed)
Social Work Patient ID: Amy Shepard, female   DOB: 02/24/1946, 73 y.o.   MRN: [id number]      Diagnosis codes:S82.851S, C34.31, J44.9, I50.32  Height: 5'1               Weight:  177          Patient suffers from Right ankle fracture, CHF, COPD, Chronic O2 dependent   which impairs her ability to perform daily activities like AD's and toileitng   in the home.  A walker  will not resolve issue with performing activities of daily living.  A wheelchair will allow patient to safely perform daily activities.  Patient is not able to propel themselves in the home using a standard weight wheelchair due to NWB and fatigues .  Patient can self propel in the lightweight wheelchair.

## 2018-12-10 ENCOUNTER — Inpatient Hospital Stay (HOSPITAL_COMMUNITY): Payer: Medicare Other | Admitting: Occupational Therapy

## 2018-12-10 ENCOUNTER — Inpatient Hospital Stay (HOSPITAL_COMMUNITY): Payer: Medicare Other

## 2018-12-10 ENCOUNTER — Inpatient Hospital Stay (HOSPITAL_COMMUNITY): Payer: Medicare Other | Admitting: Physical Therapy

## 2018-12-10 DIAGNOSIS — I5033 Acute on chronic diastolic (congestive) heart failure: Secondary | ICD-10-CM

## 2018-12-10 LAB — GLUCOSE, CAPILLARY
Glucose-Capillary: 200 mg/dL — ABNORMAL HIGH (ref 70–99)
Glucose-Capillary: 200 mg/dL — ABNORMAL HIGH (ref 70–99)
Glucose-Capillary: 157 mg/dL — ABNORMAL HIGH (ref 70–99)
Glucose-Capillary: 201 mg/dL — ABNORMAL HIGH (ref 70–99)

## 2018-12-10 MED ORDER — FUROSEMIDE 10 MG/ML IJ SOLN
40.0000 mg | Freq: Two times a day (BID) | INTRAMUSCULAR | Status: DC
  Administered 2018-12-10 – 2018-12-12 (×4): 40 mg via INTRAVENOUS
  Filled 2018-12-10 (×4): qty 4

## 2018-12-10 MED ORDER — POTASSIUM CHLORIDE ER 10 MEQ PO TBCR
20.0000 meq | EXTENDED_RELEASE_TABLET | Freq: Two times a day (BID) | ORAL | Status: DC
  Administered 2018-12-10 – 2018-12-13 (×6): 20 meq via ORAL
  Filled 2018-12-10 (×13): qty 2

## 2018-12-10 MED ORDER — INSULIN DETEMIR 100 UNIT/ML ~~LOC~~ SOLN
28.0000 [IU] | Freq: Every day | SUBCUTANEOUS | Status: DC
  Administered 2018-12-10 – 2018-12-12 (×3): 28 [IU] via SUBCUTANEOUS
  Filled 2018-12-10 (×5): qty 0.28

## 2018-12-10 MED ORDER — TECHNETIUM TO 99M ALBUMIN AGGREGATED
1.6000 | Freq: Once | INTRAVENOUS | Status: AC | PRN
  Administered 2018-12-10: 15:00:00 1.6 via INTRAVENOUS

## 2018-12-10 NOTE — Progress Notes (Signed)
Physical Therapy Weekly Progress Note  Patient Details  Name: Amy Shepard MRN: [id number] Date of Birth: Nov 26, 1945  Beginning of progress report period: Dec 02, 2018 End of progress report period: Dec 10, 2018  Today's Date: 12/10/2018 PT Individual Time: 1035-1130 PT Individual Time Calculation (min): 55 min   Patient has met 3 of 4 short term goals.  Pt is making slow progress towards goals over the past week. Medical issue including increasing SOB and lethargy as well as difficulty maintain NWB limit increased safety and independence at this time. Pt requires min assist for transfers and only able to tolerate short distances of WC mobility due to SOB.   Patient continues to demonstrate the following deficits muscle weakness and muscle joint tightness, decreased cardiorespiratoy endurance and decreased oxygen support, decreased problem solving and decreased safety awareness and decreased standing balance, decreased balance strategies and difficulty maintaining precautions and therefore will continue to benefit from skilled PT intervention to increase functional independence with mobility.  Patient progressing toward long term goals..  Continue plan of care.  PT Short Term Goals Week 1:  PT Short Term Goal 1 (Week 1): Pt will perform bed mobility with supervision assist consistently  PT Short Term Goal 1 - Progress (Week 1): Met PT Short Term Goal 2 (Week 1): Pt will performed SB transfer wit supervision assist PT Short Term Goal 2 - Progress (Week 1): Met PT Short Term Goal 3 (Week 1): Pt will propell WC 195f with supervision assist and no rest breaks  PT Short Term Goal 3 - Progress (Week 1): Progressing toward goal PT Short Term Goal 4 (Week 1): Pt will perform sit<>stand with min assist and maintain NWB with LRAD  PT Short Term Goal 4 - Progress (Week 1): Met Week 2:  PT Short Term Goal 1 (Week 2): STG=LTG  due to ELOS  Skilled Therapeutic Interventions/Progress Updates:    Pt received sitting in WC and agreeable to PT. Pt on 5L/min O2 at 93%. WC mobility on 4 L x 572fand desat to 79, PT increased O2 to 6L and 1 min to increase >88.%. Pt performed WC mobility x [id number] 6L and O2 measured at 88% after short rest break. Flow decreased to 4L and able to sustain at 88% while focusing on pursed lip breathing. O2 monitor while having [place], and consistent desat to 84-85% with conversaiton, and quickly increased >88% when focusing on breathing. Stand pivot transfer to mat table with RW and CGA from PT. SpO2 intially reading 88% and then desat to 80. PT increased to 6L and focused on pursed lip breathing x 1 min to incresaed >88%. Gait training with RW x [id number] 6L O2 with CGA from PT. SpO2 desat to 77% and 1.5 min to increase >88% while sitting in WC on 6L.   PT instructed pt in seated UE therex. Bicep curls, overhead press and chest press x 10 each. Pt maintained on 6L/min throughout. SpO2 monitored following each bout and found to desat to 85-84% for 30-45sec.   Patient returned to room and [facility] in reach and all needs met.          Therapy Documentation Precautions:  Precautions Precautions: Fall Required Braces or Orthoses: Other Brace Other Brace: R CAM boot Restrictions Weight Bearing Restrictions: Yes RLE Weight Bearing: Weight bearing as tolerated General:   Vital Signs: Therapy Vitals Pulse Rate: 73 Resp: 16 BP: (!) 106/57 Patient  Position (if appropriate): Sitting Oxygen Therapy SpO2: 99 % O2 Device: Nasal Cannula O2 Flow Rate (L/min): 5 L/min Patient Activity (if Appropriate): In chair Pulse Oximetry Type: Intermittent Pain: Pain Assessment Pain Scale: 0-10 Pain Score: 2  Pain Type: Chronic pain Pain Location: Neck Pain Orientation: Posterior Pain Descriptors / Indicators: Aching Pain Frequency: Constant Pain Onset: On-going Patients Stated Pain Goal: 2 Pain Intervention(s):  Medication (See eMAR)   Therapy/Group: Individual Therapy  Lorie Phenix 12/10/2018, 12:46 PM

## 2018-12-10 NOTE — Progress Notes (Signed)
Contact Dr. Aundra Dubin for input on patient's hypoxia. He relayed concerns of PE and recommended work up to rule out as cause due to her risk factors. V/Q scan ordered for work up.

## 2018-12-10 NOTE — Progress Notes (Signed)
[place] PHYSICAL MEDICINE & REHABILITATION PROGRESS NOTE   Subjective/Complaints:  Discussed with pulm, Dr Loanne Drilling.  Pt appreciative of consult   ROS: Patient denies  nausea, vomiting, diarrhea, cough, shortness of breath or chest pain,   Objective:   Dg Chest 2 View  Result Date: 12/09/2018 CLINICAL DATA:  Shortness of breath. EXAM: CHEST - 2 VIEW COMPARISON:  Radiograph Nov 25, 2018. FINDINGS: Stable cardiomegaly. Atherosclerosis of thoracic aorta is noted. No pneumothorax or pleural effusion is noted. Mild central pulmonary vascular congestion is noted. Postsurgical changes are noted in the right lower lobe. Stable interstitial densities are noted throughout both lungs most consistent with chronic scarring, although acute superimposed edema or inflammation cannot be excluded. Bony thorax is unremarkable. IMPRESSION: Stable cardiomegaly. Postoperative changes seen in right lower lobe. Stable interstitial densities are noted throughout both lungs most consistent with chronic scarring, although acute superimposed edema or inflammation cannot be excluded. Aortic Atherosclerosis (ICD10-I70.0). Electronically Signed   By: Marijo Conception M.D.   On: 12/09/2018 10:54   No results for input(s): WBC, HGB, HCT, PLT in the last 72 hours. No results for input(s): NA, K, CL, CO2, GLUCOSE, BUN, CREATININE, CALCIUM in the last 72 hours.  Intake/Output Summary (Last 24 hours) at 12/10/2018 0719 Last data filed at 12/09/2018 1300 Gross per 24 hour  Intake 240 ml  Output -  Net 240 ml     Physical Exam: Vital Signs Blood pressure 118/71, pulse 80, temperature 97.6 F (36.4 C), temperature source Oral, resp. rate 19, height _0  (1.549 m), weight 88 kg, SpO2 93 %.   Constitutional: No distress . Vital signs reviewed. HEENT: EOMI, oral membranes moist Neck: supple Cardiovascular: RRR without murmur. No JVD    Respiratory: CTA Bilaterally without wheezes or rales. Normal effort   Unchanged  GI:  BS +, non-tender, non-distended  Extremities: No clubbing, cyanosis, or edema Skin: No evidence of breakdown, no evidence of rash, brownish scales 70m diameter no erythema, RUE and upper back, very sparse Neurologic: Cranial nerves II through XII intact, motor strength is 4/5 in bilateral deltoid, bicep, tricep, grip, hip flexor, knee extensors,5/5 left, trace RIght  ankle dorsiflexor and plantar flexor Sensory exam normal sensation to light touch and proprioception in bilateral upper and lower extremities Cerebellar exam normal finger to nose to finger as well as heel to shin in bilateral upper and lower extremities Musculoskeletal: CAM boot RLE in place. Neck with sl spasm/tenderness along upper traps   Assessment/Plan: 1. Functional deficits secondary to Right trimalleolar fracture which require 3+ hours per day of interdisciplinary therapy in a comprehensive inpatient rehab setting.  Physiatrist is providing close team supervision and 24 hour management of active medical problems listed below.  Physiatrist and rehab team continue to assess barriers to discharge/monitor patient progress toward functional and medical goals  Care Tool:  Bathing  Bathing activity did not occur: (seated on BSC) Body parts bathed by patient: Right arm, Left arm, Chest, Abdomen, Front perineal area, Face   Body parts bathed by helper: Buttocks, Right lower leg, Left lower leg     Bathing assist Assist Level: Set up assist     Upper Body Dressing/Undressing Upper body dressing   What is the patient wearing?: Pull over shirt    Upper body assist Assist Level: Set up assist    Lower Body Dressing/Undressing Lower body dressing      What is the patient wearing?: Pants     Lower body assist Assist for lower body  dressing: Maximal Assistance - Patient 25 - 49%     Toileting Toileting Toileting Activity did not occur Landscape architect and hygiene only): Refused  Toileting assist Assist for  toileting: Maximal Assistance - Patient 25 - 49%     Transfers Chair/bed transfer  Transfers assist     Chair/bed transfer assist level: Contact Guard/Touching assist     Locomotion Ambulation   Ambulation assist   Ambulation activity did not occur: Safety/medical concerns          Walk 10 feet activity   Assist  Walk 10 feet activity did not occur: Safety/medical concerns        Walk 50 feet activity   Assist Walk 50 feet with 2 turns activity did not occur: Safety/medical concerns         Walk 150 feet activity   Assist Walk 150 feet activity did not occur: Safety/medical concerns         Walk 10 feet on uneven surface  activity   Assist Walk 10 feet on uneven surfaces activity did not occur: Safety/medical concerns         Wheelchair     Assist Will patient use wheelchair at discharge?: Yes Type of Wheelchair: Manual    Wheelchair assist level: Supervision/Verbal cueing Max wheelchair distance: 175    Wheelchair 50 feet with 2 turns activity    Assist        Assist Level: Supervision/Verbal cueing   Wheelchair 150 feet activity     Assist     Assist Level: Supervision/Verbal cueing    Medical Problem List and Plan: 1.  Functional deficits secondary to right trimalleolar ankle fracture and acute on chronic respiratory failure.            CIR PT, OT  Emphasis is on maintaining NWB RLE, pt noncompliant with WB restriction 2.  Antithrombotics: -DVT/anticoagulation:  Pharmaceutical: Lovenox             -antiplatelet therapy: N/A. 3. Pain Management:  Controlled with prn tylenol and robaxin.             Monitor with increased mobility- check xray Left great toe, order cast shoe   Paresthesias the distribution is atypical for neuropathy  gabapentin resumed at 300 3 times daily 4. Mood: LCSW to follow for evaluation and support.              -antipsychotic agents: N/A Hx depression d/c Effexor, restart Celexa 5.  Neuropsych: This patient is capable of making decisions on her own behalf. 6. Skin/Wound Care: Routine pressure relief measures. Skin area is not psoriasis, possible seborrhea, will f/u with derm as OP 7. Fluids/Electrolytes/Nutrition: Monitor I/O.    IVF stopped  -BUN sl increased, trending back up  -decrease lasix to 73m daily 8. H/o Lung cancer/COPD- O2 dependent: Currently on 6L HF oxygen--> weaned to 4L HF this afternoon.  Continue to wean supplemental oxygen as tolerated.  Continue Dulera bid with duonebs prn.  Resp virus testing (including SARS Covid 2 neg) ENc IS and Flutter valve 9. Chronic systolic CHF with pulmonary HTN: Monitor weights.  Continue Opsumit and Selexipag. On Lipitor.   -decrease lasix to 228mdaily per #7,11. Ask Cardiology to re eval prior to D/C    10. T2DM;  Continue Levemir and titrate as needed. Off Trulicity at this time. Will add meal coverage as at home and use SSI for elevated BS. Continue to monitor BS ac/hs.  Monitor with increased mobility. CBG (last 3)  Recent Labs    12/09/18 1701 12/09/18 2121 12/10/18 0635  GLUCAP 143* 116* 200*  -increase levemir to 22u QHS 5/25, will increase to 25U 5/28, up to 28U on 5/29 11. CKD:  Continue to monitor renal status with serial checks. Has improved with SCr-1.22.  BUN sl elevated, Cr near baseline  -encourage fluids,received additional Lasix 70m IV per pulm, cardiology will eval  12. Chronic hypotension: Monitor BP tid. On midodrine with BP improving.  Monitor with increased mobility. 13. Acute on chronic iron deficiency anemia: Baseline Hgb in 9 range. Has improved past transfusion. Added iron supplement. Will continue to monitor.  -hgb 8.8 and c/w recent labs 14. Right trimalleolar ankle fracture: NWB X 3 weeks through 6/2 will ask ortho if PWB or WBAT at that point . CAM at all times-    -may loosen while in bed periodically for comfort  LOS: 9 days A FACE TO [place]  Arlene Genova 12/10/2018, 7:19 AM

## 2018-12-10 NOTE — Progress Notes (Signed)
Sent a message to Dr. Dannielle Burn [facility] regarding patient's inability to maintain WB restrictions --they recommended advancing patient to  [facility] with CAM boot and to follow up in office next week. Order written.

## 2018-12-10 NOTE — Progress Notes (Signed)
Social Work Patient ID: Amy Shepard, female   DOB: 1945-11-09, 73 y.o.   MRN: [id number] Spoke with Rayna-daughter via telephone to discuss discharge needs and se if she can transport pt home at discharge. She will need to see if can, she does work and has a son she cares for. She will get back with this worker. Now pt is WBAT will see if she can be more mobile. Will work toward discharge 6/2. Pt wanted to leave sooner but will not be medically stable for this to happen. She is aware of this.

## 2018-12-10 NOTE — Progress Notes (Signed)
Patient using flutter and IS on own

## 2018-12-10 NOTE — Progress Notes (Signed)
Respiratory note: 11:35 went to flutter with patient, Patient using flutter and IS on own, patient able to obtain 1250 ml with IS.

## 2018-12-10 NOTE — Progress Notes (Signed)
Occupational Therapy Session Note  Patient Details  Name: Amy Shepard MRN: [id number] Date of Birth: 29-Jun-1946  Today's Date: 12/10/2018 OT Individual Time: [phone] and [id number] OT Individual Time Calculation (min): 71 min and 59 min  Short Term Goals: Week 2:  OT Short Term Goal 1 (Week 2): STGs=LTGs secondary to upcoming discharge date  Skilled Therapeutic Interventions/Progress Updates:    Pt greeted EOB, finishing up her breathing tx. Once the RT left, pt was set up to eat additional breakfast items. 02 sats 83% on 5L. After 4 minutes of mindful diaphragmatic breathing, pts 02 sats increased to 91%. While she ate, discussed BADL recommendations for home. She reported that the plan was for her to do self care w/c level at sink sit<stand. Reminded pt of OT recommendation to complete LB self care bedlevel and UB either EOB or w/c level at sink. Pt stated "But I don't have a bed at home." Discussed our d/c recommendation of using a hospital bed vs couch and she verbalized understanding. Pt then engaged in UB self care EOB. She required vcs for 02 mgt as she forgot to put [place] back on a few minutes after washing her face. During this time, pt required increased rest breaks and exhibited increased breathing exertion. After pt washed her Lt foot and donned a clean gripper sock, 02 sats reassessed. The reading was 74%. 4 minutes once again needed, using deep breathing and visualization techniques, to increase sats to 92%. Pt able to place slideboard in prep for slideboard transfer to w/c. Min A to stabilize board while she scooted across, and OT kept R LE elevated for NWB. After transfer, 02 sats decreased to 79%. 5 minutes required to increase to 91-94% along with instruction on mindful breathing. Pt was positioned at the sink to complete oral care. Chair alarm set. She was left in care of RN to receive pain medication for neck pain.   After discussion with RN + PA about pts 02 sats, PA  reported it was ok to bump up 02 with activity.   2nd Session 1:1 tx (59 min) Pt greeted in w/c and premedicated for neck pain. Her MD orders have changed from NWB to WBAT for R LE. Pt was very excited about this! Once she was bumped up to 6L (with sats at 100%), d/c planning continued, and we practiced functional toilet transfer at ambulatory level using device with close supervision for 02 mgt. After transferring to toilet sats were 83%. It took her 1 minute to increase sats to 91% with mindful breathing. Sit<stand completed with supervision, and pt then had 1 LOB towards the Lt side. Per pt, this often happens at home due to random LE buckling, resulting in falls. Openly discussed with fall risk difference between functional ambulation vs stand pivots at home. She reported her w/c can fit into the bathroom, and in all other living areas. Advised her to use the w/c in home and utilize stand pivots for functional transfers to maximize safety. Also recommended placing BSC beside bed for nighttime toileting. Pt appeared receptive to education, stating "that makes sense" during our discussion. Next worked on her self managing her leg rests. With significantly increased time and instructional cuing, pt able to apply her Lt leg rest! Afterwards her 02 sats were 81%. Sats increased to 94-95% after three minutes of rest and breathing. Placed her back on 5L. After a few more minutes, sats returned to 94-95%. She was left with all needs within reach and safety  belt fastened.   Therapy Documentation Precautions:  Precautions Precautions: Fall Required Braces or Orthoses: Other Brace Other Brace: R CAM boot Restrictions Weight Bearing Restrictions: Yes RLE Weight Bearing: Non weight bearing Vital Signs: Therapy Vitals Pulse Rate: 73 Resp: 16 BP: (!) 106/57 Patient Position (if appropriate): Sitting Oxygen Therapy SpO2: 99 % O2 Device: Nasal Cannula O2 Flow Rate (L/min): 5  L/min ADL:    Therapy/Group: Individual Therapy  Braydee Shimkus A Tyronza Happe 12/10/2018, 12:02 PM

## 2018-12-10 NOTE — Consult Note (Addendum)
Cardiology Consultation:   Patient ID: NEOSHA SWITALSKI; [id number]; 11/28/1945   Admit date: 12/01/2018 Date of Consult: 12/10/2018  Primary Care Provider: Maurice Small, MD Primary Cardiologist: [name], MD Primary Electrophysiologist:  None    Patient Profile:   Amy Shepard is a 73 y.o. female with a PMH of hypoxemic respiratory failure, chronic diastolic CHF, pulmonary hypertension, hx of lung cancer, CKD stage 3, who was recently admitted to [facility] after a fall c/b right ankle fracture, currently admitted to [facility] and is being seen today for the evaluation of SOB at the request of Dr. Letta Pate.  History of Present Illness:   Amy Shepard presented to [facility] 11/22/2018 after a mechanical fall. She was found to have a closed right ankle fracture and underwent ORIF 11/23/2018 with Dr. Sharol Given. Her hospital course was complicated by acute on chronic hypoxic respiratory failure/ acute on chronic diastolic CHF improved with IV lasix and acute on chronic hypotension managed with dopamine. She was discharged to [facility] 11/30/2018.   Patient was noted to have increased O2 demand for hypoxemia on exertion. She was given IV lasix 82m x1 dose yesterday in addition to po lasix 247mdaily she has been receiving at CIR (home regimen 4092mID). CXR obtained today showed no acute disease, with COPD, cardiomegaly, and vascular congestion. Pulmonary evaluated patient 12/09/2018 and felt hypoxemia was likely multifactorial in the setting of known pulmonary hypertension and chronic hypercarbic respiratory failure possibly exacerbated by deconditioning following her recent fall. She was recommended for aggressive pulmonary toileting and an Advanced Heart Failure consult for assistance.   Patient was last seen by the Advanced Heart Failure team at an outpatient visit with AshLillia MountainP 07/2018 at which time she was noted to self discontinue selexipag after experiencing HA's and joint pains after increasing her  dose. She reported chronic cough and chest pain which were unchanged. She was recommended to restart selexipag and continue opsumit for management of pulmonary HTN. She was also started on midodrine 5mg21mD for orthostatic hypotension. Her last echocardiogram 03/2018 showed EF 55-657-32%DDK0UR systolic pressures at 81mm42HCWCAt the time of this evaluation, she reports worsening DOE for the past several days. She has noticed increased LE edema, abdominal fullness, and increased orthopnea. No complaints of chest pain, dizziness, lightheadedness, or syncope. She reports her ankle is healing well and she was recently progressed to partial weight bearing status.   Over the past 24 hours, BP has been overall stable, hypoxic to 71% on RA, improved to 90s with O2 via [place]. Last labs 12/06/2018 with electrolytes wnl, Cr 1.29 (baseline), WBC 10.1, Hgb 8.8, PLT 280. CXR without acute findings. VQ Scan negative for acute PE. Her weight is up from 176lbs 12/03/2018 to 194lbs today.   Past Medical History:  Diagnosis Date   Arthritis    CAD (coronary artery disease)    a. Prior cath 2015 showed 40% prox AD, 50-50% mLAD, otherwise calcification but no obstruction in LCx/RCA.. Medical management recommended. b. 2017: low-risk NST.   Chronic diastolic CHF (congestive heart failure) ([name])    Chronic respiratory failure ([name])    CKD (chronic kidney disease), stage III ([name])    COPD (chronic obstructive pulmonary disease) ([name])    Cor pulmonale ([name])    a. felt due to advanced COPD and noncompliance with O2.   Depression    Diabetes mellitus    januvia    dx  2008   Fracture    left foot  Hx of seasonal allergies    Hypercholesteremia    Hypertension    on no medications   Lung cancer ([place])    On home oxygen therapy    "2.5L; 24/7" (03/10/2017)   Pericardial effusion    a. small-moderate in 07/2016.   Pulmonary hypertension ([place])    UTI (urinary tract infection)     Past Surgical History:    Procedure Laterality Date   APPLICATION OF WOUND VAC Right 11/23/2018   Procedure: Application Of Wound Vac;  Surgeon: Newt Minion, MD;  Location: [place];  Service: Orthopedics;  Laterality: Right;   CARDIAC CATHETERIZATION     1995 or 96  [facility] in [place]     bilateral cataracts   LEFT HEART CATHETERIZATION WITH CORONARY ANGIOGRAM N/A 07/12/2014   Procedure: LEFT HEART CATHETERIZATION WITH CORONARY ANGIOGRAM;  Surgeon: [name], MD;  Location[facility] CATH LAB;  Service: Cardiovascular;  Laterality: N/A;   MAXIMUM ACCESS (MAS)POSTERIOR LUMBAR INTERBODY FUSION (PLIF) 2 LEVEL N/A 02/22/2016   Procedure: Lumbar three-four - Lumbar four-five  MAXIMUM ACCESS SURGERY  POSTERIOR LUMBAR INTERBODY FUSION;  Surgeon: [name], MD;  Location: [place] NEURO ORS;  Service: Neurosurgery;  Laterality: N/A;   ORIF ANKLE FRACTURE Right 11/23/2018   Procedure: OPEN REDUCTION INTERNAL FIXATION (ORIF) RIGHT ANKLE FRACTURE;  Surgeon: Newt Minion, MD;  Location: [place];  Service: Orthopedics;  Laterality: Right;   ORIF TOE FRACTURE Left 06/12/2017   Procedure: OPEN REDUCTION INTERNAL FIXATION (ORIF) BASE 1ST METATARSAL (TOE) FRACTURE;  Surgeon: Newt Minion, MD;  Location: [place];  Service: Orthopedics;  Laterality: Left;   RIGHT HEART CATH N/A 08/06/2017   Procedure: RIGHT HEART CATH;  Surgeon: Larey Dresser, MD;  Location: [place] CV LAB;  Service: Cardiovascular;  Laterality: N/A;   THORACOTOMY Right 2010   lower   TONSILLECTOMY       Home Medications:  Prior to Admission medications   Medication Sig Start Date End Date Taking? Authorizing Provider  acetaminophen (TYLENOL) 325 MG tablet Take 650 mg by mouth every 6 (six) hours as needed for moderate pain or fever.     [provider]  atorvastatin (LIPITOR) 40 MG tablet Take 1 tablet (40 mg total) by mouth daily. 04/20/18 07/19/18  Larey Dresser, MD  Fluticasone-Salmeterol (ADVAIR) 250-50 MCG/DOSE  AEPB Inhale 1 puff into the lungs 2 (two) times daily as needed (shortness of breath/wheezing).     [provider]  furosemide (LASIX) 40 MG tablet Take 1 tablet (40 mg total) by mouth 2 (two) times daily. 07/20/18   Larey Dresser, MD  gabapentin (NEURONTIN) 300 MG capsule Take 1 capsule (300 mg total) by mouth at bedtime. 11/30/18   Domenic Polite, MD  Insulin Detemir (LEVEMIR FLEXTOUCH) 100 UNIT/ML Pen Inject 20 Units into the skin at bedtime. 11/30/18   Domenic Polite, MD  insulin lispro (HUMALOG) 100 UNIT/ML injection Inject 2-11 Units into the skin See admin instructions. Inject 6 units with each meal and add additional units per sliding scale: BGL 101-150 = 2 units; 151-200 = 3 units; 201-250 = 5 units; 251-300 = 7 units; 301-350 = 9 units; >350 = 11 units; CALL MD IF BGL IS >400 OR <60    [provider]  ipratropium-albuterol (DUONEB) 0.5-2.5 (3) MG/3ML SOLN Take 3 mLs by nebulization every 4 (four) hours as needed (wheezing, Shortness of breath). 03/13/16   Murlean Iba, MD  macitentan (OPSUMIT) 10  MG tablet Take 1 tablet (10 mg total) by mouth daily. 08/25/18   Larey Dresser, MD  midodrine (PROAMATINE) 5 MG tablet Take 1 tablet (5 mg total) by mouth 3 (three) times daily with meals. Patient not taking: Reported on 11/22/2018 07/22/18   Georgiana Shore, NP  OXYGEN Inhale 3 L into the lungs continuous. FOR COPD    [provider]  polyethylene glycol (MIRALAX / GLYCOLAX) 17 g packet Take 17 g by mouth daily as needed for mild constipation. 11/30/18   Domenic Polite, MD  potassium chloride SA (K-DUR,KLOR-CON) 20 MEQ tablet Take 2 tablets (40 mEq total) by mouth daily. Patient taking differently: Take 40 mEq by mouth daily as needed (low potassium).  06/29/18   Larey Dresser, MD  Selexipag (UPTRAVI) 200 MCG TABS Take 2 tablets (400 mcg total) by mouth 2 (two) times a day. 11/30/18   Domenic Polite, MD  traMADol (ULTRAM) 50 MG tablet Take 1 tablet (50 mg total)  by mouth every 8 (eight) hours as needed (for pain). 06/06/17   Danford, Suann Larry, MD  TRULICITY 1.5 XN/1.7GY SOPN Inject 1.5 mg into the vein once a week.  11/08/18   [provider]  venlafaxine XR (EFFEXOR-XR) 75 MG 24 hr capsule Take 75 mg by mouth daily with breakfast.  11/08/18   [provider]    Inpatient Medications: Scheduled Meds:  atorvastatin  40 mg Oral Daily   citalopram  10 mg Oral Daily   dextromethorphan-guaiFENesin  2 tablet Oral BID   docusate sodium  100 mg Oral BID   enoxaparin (LOVENOX) injection  40 mg Subcutaneous Q24H   furosemide  20 mg Oral Daily   gabapentin  300 mg Oral TID   insulin aspart  0-15 Units Subcutaneous TID WC   insulin aspart  3 Units Subcutaneous TID WC   insulin detemir  28 Units Subcutaneous QHS   ipratropium-albuterol  3 mL Nebulization Q6H   iron polysaccharides  150 mg Oral BID   macitentan  10 mg Oral Daily   midodrine  5 mg Oral TID WC   mometasone-formoterol  2 puff Inhalation BID   Selexipag  400 mcg Oral BID   Continuous Infusions:  PRN Meds: acetaminophen, alum & mag hydroxide-simeth, bisacodyl, diphenhydrAMINE, guaiFENesin-dextromethorphan, methocarbamol, polyethylene glycol, prochlorperazine **OR** prochlorperazine **OR** prochlorperazine, sodium chloride, sodium phosphate, traZODone  Allergies:    Allergies  Allergen Reactions   Amoxicillin Anaphylaxis, Hives, Rash and Other (See Comments)    Has patient had a PCN reaction causing immediate rash, facial/tongue/throat swelling, SOB or lightheadedness with hypotension: YES Positive reaction causing SEVERE RASH INVOLVING MUCUS MEMBRANES/SKIN NECROSIS: YES Reaction that required HOSPITALIZATION: YES Reaction occurring within the last 10 years: NO    Social History:   Social History   Socioeconomic History   Marital status: Divorced    Spouse name: Not on file   Number of children: Not on file   Years of education: Not on file    Highest education level: Not on file  Occupational History   Not on file  Social Needs   Financial resource strain: Not on file   Food insecurity:    Worry: Not on file    Inability: Not on file   Transportation needs:    Medical: Not on file    Non-medical: Not on file  Tobacco Use   Smoking status: Former Smoker    Packs/day: 1.00    Years: 30.00    Pack years: 30.00  Last attempt to quit: 07/14/2006    Years since quitting: 12.4   Smokeless tobacco: Never Used  Substance and Sexual Activity   Alcohol use: No   Drug use: No   Sexual activity: Not on file  Lifestyle   Physical activity:    Days per week: Not on file    Minutes per session: Not on file   Stress: Not on file  Relationships   Social connections:    Talks on phone: Not on file    Gets together: Not on file    Attends religious service: Not on file    Active member of club or organization: Not on file    Attends meetings of clubs or organizations: Not on file    Relationship status: Not on file   Intimate partner violence:    Fear of current or ex partner: Not on file    Emotionally abused: Not on file    Physically abused: Not on file    Forced sexual activity: Not on file  Other Topics Concern   Not on file  Social History Narrative   Not on file    Family History:    Family History  Problem Relation Age of Onset   Diabetes Father    Heart failure Father    Cancer Sister    Diabetes Brother    Heart failure Brother     Review of Systems: [y] = yes, _0  = no    General: Weight gain [y]; Weight loss _1 ; Anorexia _2 ; Fatigue [y]; Fever _3 ; Chills _4 ; Weakness Blue.Reese ]   Cardiac: Chest pain/pressure _5 ; Resting SOB [ y]; Exertional SOB [y]; Orthopnea _6 ; Pedal Edema [y]; Palpitations _7 ; Syncope _8 ; Presyncope _9 ; Paroxysmal nocturnal dyspnea[y ]   Pulmonary: Cough _10 ; [facility] ]; Hemoptysis_11 ; Sputum _12 ; Snoring _13    GI: Vomiting_14 ; Dysphagia_15 ;  Melena_16 ; Hematochezia _17 ; Heartburn_18 ; Abdominal pain _19 ; Constipation _20 ; Diarrhea _21 ; BRBPR _22    GU: Hematuria_23 ; Dysuria _24 ; Nocturia_25   Vascular: Pain in legs with walking _26 ; Pain in feet with lying flat _27 ; Non-healing sores _28 ; Stroke _29 ; TIA _30 ; Slurred speech _31 ;   Neuro: Headaches[y ]; Vertigo_32 ; Seizures_33 ; Paresthesias_34 ;Blurred vision _35 ; Diplopia _36 ; Vision changes _37    Ortho/Skin: Arthritis [y]; Joint pain [y]; Muscle pain _38 ; Joint swelling Blue.Reese ]; Back Pain [ y]; Rash _39    Psych: Depression[y ]; Anxiety_40    Heme: Bleeding problems _41 ; Clotting disorders _42 ; Anemia _43    Endocrine: Diabetes _44 ; Thyroid dysfunction_45    Physical Exam/Data:   Vitals:   12/10/18 1055 12/10/18 1125 12/10/18 1154 12/10/18 1336  BP:   (!) 106/57 (!) 105/44  Pulse:   73 77  Resp:   16 19  Temp:    97.9 F (36.6 C)  TempSrc:      SpO2: (!) 88% 94% 99% 96%  Weight:      Height:        Intake/Output Summary (Last 24 hours) at 12/10/2018 1502 Last data filed at 12/10/2018 0700 Gross per 24 hour  Intake 360 ml  Output --  Net 360 ml   Filed Weights   12/10/18 0503  Weight: 88 kg   Body mass index is 36.66 kg/m.  General:  Sitting in bedside chair in NAD. 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 Wall thickness was   increased in a pattern of mild LVH. Systolic function was normal.   The estimated ejection fraction was in the range of 55% to 60%.   Wall motion was normal; there were no regional wall motion   abnormalities. Doppler parameters are consistent with abnormal   left ventricular relaxation (grade 1 diastolic dysfunction).   Doppler parameters are consistent with high ventricular filling   pressure. - Ventricular septum: The contour showed diastolic flattening and   systolic flattening. - Mitral valve: Calcified annulus. - Left atrium: The atrium was mildly dilated. - Right ventricle: The cavity size was mildly dilated. Systolic   function was moderately reduced. - Pulmonary arteries: Systolic pressure was severely increased. PA   peak pressure: 81 mm Hg (S).  Impressions:  - Normal LV systolic function; mild LVH; mild diastolic dysfunction   with elevated LV filling pressure; mild LAE; mild RVE with   moderate RV dysfunction; mild TR with severe pulmonary   hypertension.  Right heart catheterization 07/2017: 1. Near-normal filling pressures.  2. Moderate pulmonary arterial hypertension with PVR 4.7 WU.  3. Preserved cardiac output.   Trial of pulmonary vasodilators would be reasonable.  Will start with Opsumit 10 mg daily.   Laboratory Data:  Chemistry Recent Labs  Lab 12/06/18 0604  NA 138  K 4.4  CL 102  CO2 26  GLUCOSE 199*  BUN 34*  CREATININE 1.29*  CALCIUM 9.3  GFRNONAA 41*  GFRAA 48*  ANIONGAP 10    No results for input(s): PROT, ALBUMIN, AST, ALT, ALKPHOS, BILITOT in the last 168 hours. Hematology Recent Labs  Lab 12/06/18 0604  WBC 10.1  RBC 3.66*  HGB 8.8*  HCT 29.5*  MCV 80.6  MCH 24.0*  MCHC 29.8*  RDW 19.7*  PLT 280   Cardiac EnzymesNo results for input(s): TROPONINI in the last 168 hours. No results for input(s): TROPIPOC in the last 168 hours.   BNPNo results for input(s): BNP, PROBNP in the last 168 hours.  DDimer No results for input(s): DDIMER in the last 168 hours.  Radiology/Studies:  Dg Chest 2 View  Result Date: 12/10/2018 CLINICAL DATA:  Left chest heaviness and productive cough, chronic. EXAM: CHEST - 2 VIEW COMPARISON:  PA and lateral chest 12/09/2018. Single-view of the chest 11/25/2018 and 01/29/2018. FINDINGS: Lungs are emphysematous. There is cardiomegaly and pulmonary vascular congestion. No consolidative process, pneumothorax or effusion. Aortic atherosclerosis is noted. No acute abnormality. IMPRESSION: No acute disease. Cardiomegaly and vascular congestion. COPD. Atherosclerosis. Electronically Signed   By: Inge Rise M.D.   On: 12/10/2018 14:35   Dg Chest 2 View  Result Date: 12/09/2018 CLINICAL DATA:  Shortness of breath. EXAM: CHEST - 2 VIEW COMPARISON:  Radiograph Nov 25, 2018. FINDINGS: Stable cardiomegaly. Atherosclerosis of thoracic aorta is noted. No pneumothorax or pleural effusion is noted. Mild central pulmonary vascular congestion is noted. Postsurgical changes are noted in the right lower lobe. Stable interstitial densities are noted throughout both lungs most consistent with chronic scarring, although acute superimposed edema or inflammation cannot be excluded. Bony thorax is unremarkable. IMPRESSION: Stable cardiomegaly. Postoperative changes seen in right lower lobe. Stable interstitial densities are noted throughout both lungs most consistent with chronic scarring, although acute superimposed edema or inflammation cannot be excluded. Aortic Atherosclerosis (ICD10-I70.0). Electronically Signed   By: Marijo Conception M.D.   On: 12/09/2018 10:54   Dg Toe Great Left  Result Date: 12/07/2018 CLINICAL DATA:  Left toe pain. EXAM: LEFT GREAT TOE COMPARISON:  04/10/2018. FINDINGS: Previous screw fixation of the base of the first metatarsal bone with the first cuneiform and navicular bone. There is peri-screw  lucency and mild cortical bone loss at the level of the base of first proximal metatarsal bone. There are 2 focal bone erosions with overhanging edges involving the head of the second and third metatarsal bones. The joint spaces appear well preserved. IMPRESSION: 1. No acute fracture identified. 2. Status post hardware fusion of the first metatarsal with the first cuneiform and navicular bone. Here, there is peri screw lucency and suspected loss of bone at the base of the first metatarsal bone. If there is a clinical concern for loosening or infection consider further investigation with three-phase bone scintigraphy. 3. Small bone erosions with overhanging edges involve the head of the second and third metatarsal bones. Correlation for any clinical signs or symptoms of inflammatory arthropathy including crystalline deposition disease. Electronically Signed   By: Kerby Moors M.D.   On: 12/07/2018 08:45    Assessment and Plan:   1. Acute on chronic diastolic CHF: patient with increased O2 demand with ambulation. She reports increased LE edema, abdominal fullness, and orthopnea. CXR with cardiomegaly and vascular congestion. VQ scan negative for PE. She has been on a lower dose of home lasix since admission to CIR. Her weight has increased from 176lbs on 12/03/2018 to 194lbs today. Suspect hypoxemia is related to acute on chronic diastolic CHF.  - Will start lasix 62m IV daily - Check BMET and Mg in AM - Monitor strict I&Os and daily weights.  - Monitor BP with diuresis given history of hypotension  2. Pulmonary HTN: she reports ongoing headaches and neck pains with selexipag use.  - Continue selexipag and macitentan at current doses - Will defer any medication changes to MD - recommended to be on highest dose of selexipag that patient can tolerate.   3. Acute on chronic hypoxemic respiratory failure: likely 2/2 #1 at this time.  - Continue supplemental O2 via [place] - Continue pulmonary toileting -  Continue diuresis as above  4. Orthostatic hypotension: BP overall stable on midodrine - Monitor BP closely with diuresis - Continue midodrine   5. Right ankle fracture s/p ORIF: reason for admission to CIR. Healing well per patient - Continue management per primary team.  6. CKD stage 3: Cr 1.29 on last check 5/25.  - Continue to monitor closely with diuresis  7. DM type 2: on insulin - Continue management per primary team  8. Anemia: Iron deficiency. On po supplements - Continue management per primary team    For questions or updates, please contact [place]kanoganPlease consult [email or web address] for contact info under Cardiology/STEMI.   Signed, KAbigail Butts PA-C  12/10/2018 3:02  PM [phone]  Patient seen and examined with the above-signed Advanced Practice Provider and/or Housestaff. I personally reviewed laboratory data, imaging studies and relevant notes. I independently examined the patient and formulated the important aspects of the plan. I have edited the note to reflect any of my changes or salient points. I have personally discussed the plan with the patient and/or family.  VQ, recent echo and CXRs reviewed person ally.   73 y/o woman with multiple medical problems including PAH, obesity, diastolic HF, CKD 3. DM recently admitted to [facility] with R ankle fracture after mechanical fall. Now in CIR and has developed progressive SOB and hypoxia.   VQ negative for PE. CXR + edema  Has gained 15 pounds over past week. She is on low-dose lasix (below her baseline dose)   On exam General:  Chronically ill appearing Sitting up in chair. No overt respiratory difficulty 4-5 drinks on her tray HEENT: normal Neck: supple. nVP to jaw. Carotids 2+ bilat; no bruits. No lymphadenopathy or thryomegaly appreciated. Cor: PMI nondisplaced. Regular rate & rhythm. 2/6 TR. Lungs: decreased BS throughout. Crackles at bases  Abdomen: obese soft, nontender, nondistended. No  hepatosplenomegaly. No bruits or masses. Good bowel sounds. Extremities: no cyanosis, clubbing, rash, 2+ edema + wraps. RLE boot Neuro: alert & orientedx3, cranial nerves grossly intact. moves all 4 extremities w/o difficulty. Affect pleasant   She is volume overloaded with acute on chronic diastolic HF in setting of high fluid intake and being on lower than normal dose of her lasix. Weight up 15 pounds  Recs: 1. Lasix 40 IV bid for 1-2 days 2. Limit fluid intake 3. Daily weights 4. Daily BMET. 5. Continue PAH meds  We will follow.  Glori Bickers, MD  6:11 PM

## 2018-12-11 ENCOUNTER — Inpatient Hospital Stay (HOSPITAL_COMMUNITY): Payer: Medicare Other | Admitting: Physical Therapy

## 2018-12-11 DIAGNOSIS — N183 Chronic kidney disease, stage 3 unspecified: Secondary | ICD-10-CM

## 2018-12-11 DIAGNOSIS — I5030 Unspecified diastolic (congestive) heart failure: Secondary | ICD-10-CM

## 2018-12-11 DIAGNOSIS — E1169 Type 2 diabetes mellitus with other specified complication: Secondary | ICD-10-CM

## 2018-12-11 DIAGNOSIS — I130 Hypertensive heart and chronic kidney disease with heart failure and stage 1 through stage 4 chronic kidney disease, or unspecified chronic kidney disease: Secondary | ICD-10-CM

## 2018-12-11 DIAGNOSIS — Z9981 Dependence on supplemental oxygen: Secondary | ICD-10-CM

## 2018-12-11 DIAGNOSIS — I5023 Acute on chronic systolic (congestive) heart failure: Secondary | ICD-10-CM

## 2018-12-11 DIAGNOSIS — I5033 Acute on chronic diastolic (congestive) heart failure: Secondary | ICD-10-CM

## 2018-12-11 DIAGNOSIS — D649 Anemia, unspecified: Secondary | ICD-10-CM

## 2018-12-11 DIAGNOSIS — R0989 Other specified symptoms and signs involving the circulatory and respiratory systems: Secondary | ICD-10-CM

## 2018-12-11 LAB — GLUCOSE, CAPILLARY
Glucose-Capillary: 163 mg/dL — ABNORMAL HIGH (ref 70–99)
Glucose-Capillary: 219 mg/dL — ABNORMAL HIGH (ref 70–99)
Glucose-Capillary: 195 mg/dL — ABNORMAL HIGH (ref 70–99)
Glucose-Capillary: 227 mg/dL — ABNORMAL HIGH (ref 70–99)

## 2018-12-11 LAB — BASIC METABOLIC PANEL
Anion gap: 12 (ref 5–15)
BUN: 45 mg/dL — ABNORMAL HIGH (ref 8–23)
CO2: 26 mmol/L (ref 22–32)
Calcium: 9.2 mg/dL (ref 8.9–10.3)
Chloride: 101 mmol/L (ref 98–111)
Creatinine, Ser: 1.43 mg/dL — ABNORMAL HIGH (ref 0.44–1.00)
GFR calc Af Amer: 42 mL/min — ABNORMAL LOW (ref >60)
GFR calc non Af Amer: 36 mL/min — ABNORMAL LOW (ref >60)
Glucose, Bld: 175 mg/dL — ABNORMAL HIGH (ref 70–99)
Potassium: 4.3 mmol/L (ref 3.5–5.1)
Sodium: 139 mmol/L (ref 135–145)

## 2018-12-11 LAB — MAGNESIUM: Magnesium: 2.2 mg/dL (ref 1.7–2.4)

## 2018-12-11 MED ORDER — SODIUM CHLORIDE 0.9% FLUSH
3.0000 mL | Freq: Two times a day (BID) | INTRAVENOUS | Status: DC
  Administered 2018-12-11 – 2018-12-14 (×4): 3 mL via INTRAVENOUS

## 2018-12-11 MED ORDER — CHLORHEXIDINE GLUCONATE 4 % EX LIQD
60.0000 mL | Freq: Once | CUTANEOUS | Status: DC
  Filled 2018-12-11: qty 60

## 2018-12-11 MED ORDER — SODIUM CHLORIDE 0.9% FLUSH: 3.0000 mL | INTRAVENOUS | Status: DC | PRN

## 2018-12-11 MED ORDER — CLINDAMYCIN PHOSPHATE 900 MG/50ML IV SOLN: 900.0000 mg | INTRAVENOUS | Status: DC

## 2018-12-11 NOTE — Progress Notes (Signed)
Expressed to patient concern re:  Her recent excessive intake of fluids given her diagnoses of CHF, pulmonary HTN, and COPD.  She continues to be non-compliant with fluid restrictions.  States she is "keeping track" of her intake, but her data is not accurate.  Attempt made to support her emortionally as she does not comprehend the need to decrease fluid intake while on diarrhetics.  States to me, " one has nothing to do with the other!"

## 2018-12-11 NOTE — Progress Notes (Signed)
[place] PHYSICAL MEDICINE & REHABILITATION PROGRESS NOTE   Subjective/Complaints: Patient seen sitting up in her chair this morning.  She states she slept well overnight.  She denies complaints.  ROS: Denies CP, SOB, N/V/D  Objective:   Dg Chest 2 View  Result Date: 12/10/2018 CLINICAL DATA:  Left chest heaviness and productive cough, chronic. EXAM: CHEST - 2 VIEW COMPARISON:  PA and lateral chest 12/09/2018. Single-view of the chest 11/25/2018 and 01/29/2018. FINDINGS: Lungs are emphysematous. There is cardiomegaly and pulmonary vascular congestion. No consolidative process, pneumothorax or effusion. Aortic atherosclerosis is noted. No acute abnormality. IMPRESSION: No acute disease. Cardiomegaly and vascular congestion. COPD. Atherosclerosis. Electronically Signed   By: Inge Rise M.D.   On: 12/10/2018 14:35   Dg Chest 2 View  Result Date: 12/09/2018 CLINICAL DATA:  Shortness of breath. EXAM: CHEST - 2 VIEW COMPARISON:  Radiograph Nov 25, 2018. FINDINGS: Stable cardiomegaly. Atherosclerosis of thoracic aorta is noted. No pneumothorax or pleural effusion is noted. Mild central pulmonary vascular congestion is noted. Postsurgical changes are noted in the right lower lobe. Stable interstitial densities are noted throughout both lungs most consistent with chronic scarring, although acute superimposed edema or inflammation cannot be excluded. Bony thorax is unremarkable. IMPRESSION: Stable cardiomegaly. Postoperative changes seen in right lower lobe. Stable interstitial densities are noted throughout both lungs most consistent with chronic scarring, although acute superimposed edema or inflammation cannot be excluded. Aortic Atherosclerosis (ICD10-I70.0). Electronically Signed   By: Marijo Conception M.D.   On: 12/09/2018 10:54   Nm Pulmonary Perfusion  Result Date: 12/10/2018 CLINICAL DATA:  History of interstitial lung disease. Status post ankle fracture with ongoing hypoxia and chronic  kidney disease. EXAM: NUCLEAR MEDICINE PERFUSION LUNG SCAN TECHNIQUE: Perfusion images were obtained in multiple projections after intravenous injection of radiopharmaceutical. Ventilation scans intentionally deferred if perfusion scan and chest x-ray adequate for interpretation during COVID 19 epidemic. RADIOPHARMACEUTICALS:  1.6 mCi Tc-90mMAA IV COMPARISON:  None FINDINGS: There is a uniform distribution of the radiopharmaceutical within both lungs. No segmental perfusion defects identified to suggest an acute pulmonary embolus. IMPRESSION: 1. No evidence for acute pulmonary embolus. Electronically Signed   By: TKerby MoorsM.D.   On: 12/10/2018 15:27   No results for input(s): WBC, HGB, HCT, PLT in the last 72 hours. Recent Labs    12/11/18 0546  NA 139  K 4.3  CL 101  CO2 26  GLUCOSE 175*  BUN 45*  CREATININE 1.43*  CALCIUM 9.2    Intake/Output Summary (Last 24 hours) at 12/11/2018 0920 Last data filed at 12/11/2018 0610 Gross per 24 hour  Intake 240 ml  Output 2000 ml  Net -1760 ml     Physical Exam: Vital Signs Blood pressure (!) 106/49, pulse 82, temperature 98.3 F (36.8 C), temperature source Oral, resp. rate 18, height _0  (1.549 m), weight 87 kg, SpO2 (!) 84 %. Constitutional: No distress . Vital signs reviewed. HENT: Normocephalic.  Atraumatic. Eyes: EOMI. No discharge. Cardiovascular: No JVD. Respiratory: Normal effort. GI: Non-distended. Musc: Cam boot right lower extremity. Neurologic: Alert and oriented Motor: 4+-5/5 bilateral upper extremities and left lower extremity. Right lower extremity hip flexion, knee extension 4+/5, wiggles toes Sensation intact light touch  Assessment/Plan: 1. Functional deficits secondary to Right trimalleolar fracture which require 3+ hours per day of interdisciplinary therapy in a comprehensive inpatient rehab setting.  Physiatrist is providing close team supervision and 24 hour management of active medical problems listed  below.  Physiatrist and rehab  team continue to assess barriers to discharge/monitor patient progress toward functional and medical goals  Care Tool:  Bathing  Bathing activity did not occur: (seated on BSC) Body parts bathed by patient: Right arm, Left arm, Chest, Abdomen, Front perineal area, Face   Body parts bathed by helper: Buttocks, Right lower leg, Left lower leg     Bathing assist Assist Level: Set up assist     Upper Body Dressing/Undressing Upper body dressing   What is the patient wearing?: Pull over shirt    Upper body assist Assist Level: Set up assist    Lower Body Dressing/Undressing Lower body dressing      What is the patient wearing?: Pants     Lower body assist Assist for lower body dressing: Maximal Assistance - Patient 25 - 49%     Toileting Toileting Toileting Activity did not occur (Clothing management and hygiene only): Refused  Toileting assist Assist for toileting: Maximal Assistance - Patient 25 - 49%     Transfers Chair/bed transfer  Transfers assist     Chair/bed transfer assist level: Contact Guard/Touching assist     Locomotion Ambulation   Ambulation assist   Ambulation activity did not occur: Safety/medical concerns  Assist level: Contact Guard/Touching assist Assistive device: Walker-rolling Max distance: 10   Walk 10 feet activity   Assist  Walk 10 feet activity did not occur: Safety/medical concerns  Assist level: Contact Guard/Touching assist     Walk 50 feet activity   Assist Walk 50 feet with 2 turns activity did not occur: Safety/medical concerns         Walk 150 feet activity   Assist Walk 150 feet activity did not occur: Safety/medical concerns         Walk 10 feet on uneven surface  activity   Assist Walk 10 feet on uneven surfaces activity did not occur: Safety/medical concerns         Wheelchair     Assist Will patient use wheelchair at discharge?: Yes Type of Wheelchair:  Manual    Wheelchair assist level: Supervision/Verbal cueing Max wheelchair distance: 75    Wheelchair 50 feet with 2 turns activity    Assist        Assist Level: Supervision/Verbal cueing   Wheelchair 150 feet activity     Assist     Assist Level: Supervision/Verbal cueing    Medical Problem List and Plan: 1.  Functional deficits secondary to right trimalleolar ankle fracture and acute on chronic respiratory failure.    Continue CIR   Patient noncompliant with WB restriction, discussed with Ortho, advance to weightbearing as tolerated with cam boot 2.  Antithrombotics: -DVT/anticoagulation:  Pharmaceutical: Lovenox             -antiplatelet therapy: N/A. 3. Pain Management:  Controlled with prn tylenol and robaxin.             Monitor with increased mobility   Paresthesias the distribution is atypical for neuropathy gabapentin resumed at 300 3 times daily 4. Mood: LCSW to follow for evaluation and support.              -antipsychotic agents: N/A Hx depression d/c Effexor, restart Celexa 5. Neuropsych: This patient is capable of making decisions on her own behalf. 6. Skin/Wound Care: Routine pressure relief measures. Skin area is not psoriasis, possible seborrhea, will f/u with derm as OP 7. Fluids/Electrolytes/Nutrition: Monitor I/O.     8. H/o Lung cancer/COPD- O2 dependent:   Continue Dulera bid with  duonebs prn.   Resp virus testing (including SARS Covid 2 neg)  IS and Flutter valve  Discussed hypoxia with cards yesterday, recommend work-up for PE.  VQ scan ordered, negative for PE. 9. Chronic systolic CHF with pulmonary HTN: Monitor weights.  Continue Opsumit and Selexipag. On Lipitor.   Cards consulted for shortness of breath and believed it was secondary to fluid overload.  Diuretics, labs ordered.  Appreciate recs   10. T2DM;  Continue Levemir and titrate as needed. Off Trulicity at this time.  Meal coverage as at home and use SSI for elevated BS.  Continue to monitor BS ac/hs.  Monitor with increased mobility. CBG (last 3)  Recent Labs    12/10/18 1706 12/10/18 2120 12/11/18 0605  GLUCAP 157* 201* 163*   -increased to 28U on 5/29  Remains elevated on 5/30 11. CKD:  Continue to monitor renal status with serial checks.   Creatinine 1.43 on 5/30  Continue to monitor 12. Chronic hypotension: Monitor BP tid. On midodrine   Labile on 5/30 13. Acute on chronic iron deficiency anemia: Baseline Hgb in 9 range. Has improved past transfusion. Added iron supplement. Will continue to monitor.  Hemoglobin 8.8 on 5/25 14. Right trimalleolar ankle fracture:   CAM at all times, may loosen while in bed periodically for comfort  LOS: 10 days A FACE TO FACE EVALUATION WAS PERFORMED  Ankit Lorie Phenix 12/11/2018, 9:20 AM

## 2018-12-11 NOTE — Progress Notes (Signed)
Physical Therapy Session Note  Patient Details  Name: Amy Shepard MRN: [id number] Date of Birth: [birth year]  Today's Date: 12/11/2018 PT Individual Time: 0800-0900 PT Individual Time Calculation (min): 60 min   Short Term Goals: Week 2:  PT Short Term Goal 1 (Week 2): STG=LTG  due to ELOS  Skilled Therapeutic Interventions/Progress Updates:   Pt received sitting in WC and agreeable to PT  WC mobility instructed by PT x 189f with 2 short rest breaks and x 1624fwith 1 prolonged rest break. PT measured SpO2 on 6 L/min at eand of each bout of WC mobility at 85-86% and increased >88% in 20 seconds of rest with pursed lip breathing.   Gait training with RW x 2049fith RW and supervision assist from PT. Mild antalgic gait pattern noted, but reports no pain with WB in the RLE. SpO2 measured at 84% on 6L/min at end of gait training and increased >88% in 45 seconds of rest. Orthostatic vital signs sitting 133/88. Standing, 126/66. No s/s of orthostatics reported. .   Seated BLE therex, LAQ x 15, hip abduction x 12 with manual resistance, reciprocal hip flexion x 12 with level 2 tband, ankle PF x 15 LLE, sit<>stand x 5 with BUE support. Cues for full ROM, decreased eccentric speed, and improved pursed lip breathing throughout.   Patient returned to room and left [facility] in [facility] and all needs met.   .        Therapy Documentation Precautions:  Precautions Precautions: Fall Required Braces or Orthoses: Other Brace Other Brace: R CAM boot Restrictions Weight Bearing Restrictions: Yes RLE Weight Bearing: Weight bearing as tolerated    Vital Signs: Therapy Vitals Temp: 98.3 F (36.8 C) Temp Source: Oral Pulse Rate: 82 Resp: 18 BP: (!) 106/49 Patient Position (if appropriate): Lying Oxygen Therapy SpO2: (!) 84 % O2 Device: Nasal Cannula O2 Flow Rate (L/min): 6 L/min Patient Activity (if Appropriate): Ambulating Pain: Denies at rest.     Therapy/Group: Individual Therapy  AusLorie Phenix30/2020, 9:03 AM

## 2018-12-11 NOTE — Progress Notes (Signed)
Advanced Heart Failure Rounding Note   Subjective:    Diuresing well on lasix 40 IV bid. Breathing better. Weight down 3 pounds. No orthopnea or PND. Creatinine 1.3 -> 1.4   She continues to drink a lot of fluid.  SOB is better. No orthopnea or PND.  Objective:   Weight Range:  Vital Signs:   Temp:  [98.1 F (36.7 C)-98.3 F (36.8 C)] 98.3 F (36.8 C) (05/30 0521) Pulse Rate:  [72-82] 82 (05/30 0815) Resp:  [14-19] 18 (05/30 0521) BP: (106-133)/(49-63) 106/49 (05/30 0521) SpO2:  [84 %-96 %] 94 % (05/30 1412) Weight:  [87 kg] 87 kg (05/30 0521) Last BM Date: 12/10/18  Weight change: Filed Weights   12/10/18 0503 12/11/18 0521  Weight: 88 kg 87 kg    Intake/Output:   Intake/Output Summary (Last 24 hours) at 12/11/2018 1453 Last data filed at 12/11/2018 1202 Gross per 24 hour  Intake -  Output 2800 ml  Net -2800 ml     Physical Exam: General:  Elderly woman sitting in chair No resp difficulty HEENT: normal Neck: supple. JVP 9-10 . Carotids 2+ bilat; no bruits. No lymphadenopathy or thryomegaly appreciated. Cor: PMI nondisplaced. Regular rate & rhythm. No rubs, gallops or murmurs. Lungs: decreased BS throughout no wheeze Abdomen: soft, nontender, nondistended. No hepatosplenomegaly. No bruits or masses. Good bowel sounds. Extremities: no cyanosis, clubbing, rash, 1+ edema RLE in boot  Neuro: alert & orientedx3, cranial nerves grossly intact. moves all 4 extremities w/o difficulty. Affect pleasant  Telemetry: NSR 70-80 Personally reviewed   Labs: Basic Metabolic Panel: Recent Labs  Lab 12/06/18 0604 12/11/18 0546  NA 138 139  K 4.4 4.3  CL 102 101  CO2 26 26  GLUCOSE 199* 175*  BUN 34* 45*  CREATININE 1.29* 1.43*  CALCIUM 9.3 9.2  MG  --  2.2    Liver Function Tests: No results for input(s): AST, ALT, ALKPHOS, BILITOT, PROT, ALBUMIN in the last 168 hours. No results for input(s): LIPASE, AMYLASE in the last 168 hours. No results for input(s):  AMMONIA in the last 168 hours.  CBC: Recent Labs  Lab 12/06/18 0604  WBC 10.1  HGB 8.8*  HCT 29.5*  MCV 80.6  PLT 280    Cardiac Enzymes: No results for input(s): CKTOTAL, CKMB, CKMBINDEX, TROPONINI in the last 168 hours.  BNP: BNP (last 3 results) Recent Labs    03/11/18 1526 04/09/18 1749  BNP 246.9* 297.8*    ProBNP (last 3 results) No results for input(s): PROBNP in the last 8760 hours.    Other results:  Imaging: Dg Chest 2 View  Result Date: 12/10/2018 CLINICAL DATA:  Left chest heaviness and productive cough, chronic. EXAM: CHEST - 2 VIEW COMPARISON:  PA and lateral chest 12/09/2018. Single-view of the chest 11/25/2018 and 01/29/2018. FINDINGS: Lungs are emphysematous. There is cardiomegaly and pulmonary vascular congestion. No consolidative process, pneumothorax or effusion. Aortic atherosclerosis is noted. No acute abnormality. IMPRESSION: No acute disease. Cardiomegaly and vascular congestion. COPD. Atherosclerosis. Electronically Signed   By: Inge Rise M.D.   On: 12/10/2018 14:35   Nm Pulmonary Perfusion  Result Date: 12/10/2018 CLINICAL DATA:  History of interstitial lung disease. Status post ankle fracture with ongoing hypoxia and chronic kidney disease. EXAM: NUCLEAR MEDICINE PERFUSION LUNG SCAN TECHNIQUE: Perfusion images were obtained in multiple projections after intravenous injection of radiopharmaceutical. Ventilation scans intentionally deferred if perfusion scan and chest x-ray adequate for interpretation during COVID 19 epidemic. RADIOPHARMACEUTICALS:  1.6 mCi Tc-70mMAA  IV COMPARISON:  None FINDINGS: There is a uniform distribution of the radiopharmaceutical within both lungs. No segmental perfusion defects identified to suggest an acute pulmonary embolus. IMPRESSION: 1. No evidence for acute pulmonary embolus. Electronically Signed   By: Kerby Moors M.D.   On: 12/10/2018 15:27      Medications:     Scheduled Medications: . atorvastatin   40 mg Oral Daily  . citalopram  10 mg Oral Daily  . dextromethorphan-guaiFENesin  2 tablet Oral BID  . docusate sodium  100 mg Oral BID  . enoxaparin (LOVENOX) injection  40 mg Subcutaneous Q24H  . furosemide  40 mg Intravenous BID  . gabapentin  300 mg Oral TID  . insulin aspart  0-15 Units Subcutaneous TID WC  . insulin aspart  3 Units Subcutaneous TID WC  . insulin detemir  28 Units Subcutaneous QHS  . ipratropium-albuterol  3 mL Nebulization Q6H  . iron polysaccharides  150 mg Oral BID  . macitentan  10 mg Oral Daily  . midodrine  5 mg Oral TID WC  . mometasone-formoterol  2 puff Inhalation BID  . potassium chloride  20 mEq Oral BID  . Selexipag  400 mcg Oral BID     Infusions:   PRN Medications:  acetaminophen, alum & mag hydroxide-simeth, bisacodyl, diphenhydrAMINE, guaiFENesin-dextromethorphan, methocarbamol, polyethylene glycol, prochlorperazine **OR** prochlorperazine **OR** prochlorperazine, sodium chloride, sodium phosphate, traZODone   Assessment/Plan:    1. Acute on chronic diastolic CHF: - Diuresis well on lasix 40 IV bid. Will continue for now. Still about 10 pounds from dry weight - Creatinine stable. Follow with diuresis - Monitor strict I&Os and daily weights.  - Monitor BP with diuresis given history of hypotension - She continue to drink copious amounts of fluid. I counseled her to stop this.   2. Pulmonary HTN:  - Continue selexipag and macitentan at current doses  3. Acute on chronic hypoxemic respiratory failure: likely 2/2 #1 at this time.  - VQ negative on 12/10/18 - Continue supplemental O2 via [place] - Continue pulmonary toileting - Continue diuresis as above  4. Orthostatic hypotension: BP overall stable on midodrine - Monitor BP closely with diuresis - Continue midodrine    Length of Stay: 10   Glori Bickers MD 12/11/2018, 2:53 PM  Advanced Heart Failure Team Pager [phone] (M-F; 7a - 4p)  Please contact [facility] for  night-coverage after hours (4p -7a ) and weekends on [email or web address]

## 2018-12-12 ENCOUNTER — Inpatient Hospital Stay (HOSPITAL_COMMUNITY): Payer: Medicare Other | Admitting: Occupational Therapy

## 2018-12-12 DIAGNOSIS — I5023 Acute on chronic systolic (congestive) heart failure: Secondary | ICD-10-CM

## 2018-12-12 DIAGNOSIS — R7309 Other abnormal glucose: Secondary | ICD-10-CM

## 2018-12-12 DIAGNOSIS — I959 Hypotension, unspecified: Secondary | ICD-10-CM

## 2018-12-12 DIAGNOSIS — I272 Pulmonary hypertension, unspecified: Secondary | ICD-10-CM

## 2018-12-12 DIAGNOSIS — R0602 Shortness of breath: Secondary | ICD-10-CM

## 2018-12-12 LAB — GLUCOSE, CAPILLARY
Glucose-Capillary: 156 mg/dL — ABNORMAL HIGH (ref 70–99)
Glucose-Capillary: 127 mg/dL — ABNORMAL HIGH (ref 70–99)
Glucose-Capillary: 193 mg/dL — ABNORMAL HIGH (ref 70–99)
Glucose-Capillary: 232 mg/dL — ABNORMAL HIGH (ref 70–99)

## 2018-12-12 MED ORDER — FUROSEMIDE 10 MG/ML IJ SOLN
80.0000 mg | Freq: Two times a day (BID) | INTRAMUSCULAR | Status: DC
  Administered 2018-12-12 – 2018-12-13 (×2): 80 mg via INTRAVENOUS
  Filled 2018-12-12 (×2): qty 8

## 2018-12-12 MED ORDER — IPRATROPIUM-ALBUTEROL 0.5-2.5 (3) MG/3ML IN SOLN
3.0000 mL | Freq: Three times a day (TID) | RESPIRATORY_TRACT | Status: DC
  Administered 2018-12-12 – 2018-12-13 (×4): 3 mL via RESPIRATORY_TRACT
  Filled 2018-12-12 (×4): qty 3

## 2018-12-12 MED ORDER — METOLAZONE 5 MG PO TABS
5.0000 mg | ORAL_TABLET | Freq: Every day | ORAL | Status: DC
  Administered 2018-12-12 – 2018-12-13 (×2): 5 mg via ORAL
  Filled 2018-12-12 (×2): qty 1

## 2018-12-12 NOTE — Progress Notes (Signed)
[place] PHYSICAL MEDICINE & REHABILITATION PROGRESS NOTE   Subjective/Complaints: Patient seen sitting up in her chair this morning.  She states she slept well overnight.  She was seen by heart failure yesterday, notes reviewed.  ROS: Denies CP, SOB, N/V/D  Objective:   Dg Chest 2 View  Result Date: 12/10/2018 CLINICAL DATA:  Left chest heaviness and productive cough, chronic. EXAM: CHEST - 2 VIEW COMPARISON:  PA and lateral chest 12/09/2018. Single-view of the chest 11/25/2018 and 01/29/2018. FINDINGS: Lungs are emphysematous. There is cardiomegaly and pulmonary vascular congestion. No consolidative process, pneumothorax or effusion. Aortic atherosclerosis is noted. No acute abnormality. IMPRESSION: No acute disease. Cardiomegaly and vascular congestion. COPD. Atherosclerosis. Electronically Signed   By: Inge Rise M.D.   On: 12/10/2018 14:35   Nm Pulmonary Perfusion  Result Date: 12/10/2018 CLINICAL DATA:  History of interstitial lung disease. Status post ankle fracture with ongoing hypoxia and chronic kidney disease. EXAM: NUCLEAR MEDICINE PERFUSION LUNG SCAN TECHNIQUE: Perfusion images were obtained in multiple projections after intravenous injection of radiopharmaceutical. Ventilation scans intentionally deferred if perfusion scan and chest x-ray adequate for interpretation during COVID 19 epidemic. RADIOPHARMACEUTICALS:  1.6 mCi Tc-69mMAA IV COMPARISON:  None FINDINGS: There is a uniform distribution of the radiopharmaceutical within both lungs. No segmental perfusion defects identified to suggest an acute pulmonary embolus. IMPRESSION: 1. No evidence for acute pulmonary embolus. Electronically Signed   By: TKerby MoorsM.D.   On: 12/10/2018 15:27   No results for input(s): WBC, HGB, HCT, PLT in the last 72 hours. Recent Labs    12/11/18 0546  NA 139  K 4.3  CL 101  CO2 26  GLUCOSE 175*  BUN 45*  CREATININE 1.43*  CALCIUM 9.2    Intake/Output Summary (Last 24 hours)  at 12/12/2018 1145 Last data filed at 12/12/2018 0420 Gross per 24 hour  Intake 1040 ml  Output 1850 ml  Net -810 ml     Physical Exam: Vital Signs Blood pressure (!) 114/58, pulse 76, temperature 97.9 F (36.6 C), temperature source Oral, resp. rate 19, height 5' 2" (1.575 m), weight 89 kg, SpO2 94 %. Constitutional: No distress . Vital signs reviewed. HENT: Normocephalic.  Atraumatic. Eyes: EOMI.  No discharge. Cardiovascular: No JVD. Respiratory: Normal effort.  + [place]. GI: Non-distended. Musc: Cam boot right lower extremity. Lower extremity edema Neurologic: Alert and oriented Motor: 4+-5/5 bilateral upper extremities and left lower extremity, stable. Right lower extremity hip flexion, knee extension 4+/5, wiggles toes Sensation intact light touch  Assessment/Plan: 1. Functional deficits secondary to Right trimalleolar fracture which require 3+ hours per day of interdisciplinary therapy in a comprehensive inpatient rehab setting.  Physiatrist is providing close team supervision and 24 hour management of active medical problems listed below.  Physiatrist and rehab team continue to assess barriers to discharge/monitor patient progress toward functional and medical goals  Care Tool:  Bathing  Bathing activity did not occur: (seated on BSC) Body parts bathed by patient: Right arm, Left arm, Chest, Abdomen, Front perineal area, Face   Body parts bathed by helper: Buttocks, Right lower leg, Left lower leg     Bathing assist Assist Level: Set up assist     Upper Body Dressing/Undressing Upper body dressing   What is the patient wearing?: Pull over shirt    Upper body assist Assist Level: Set up assist    Lower Body Dressing/Undressing Lower body dressing      What is the patient wearing?: Pants  Lower body assist Assist for lower body dressing: Maximal Assistance - Patient 25 - 49%     Toileting Toileting Toileting Activity did not occur Landscape architect  and hygiene only): Refused  Toileting assist Assist for toileting: Maximal Assistance - Patient 25 - 49%     Transfers Chair/bed transfer  Transfers assist     Chair/bed transfer assist level: Supervision/Verbal cueing     Locomotion Ambulation   Ambulation assist   Ambulation activity did not occur: Safety/medical concerns  Assist level: Supervision/Verbal cueing Assistive device: Walker-rolling Max distance: 20   Walk 10 feet activity   Assist  Walk 10 feet activity did not occur: Safety/medical concerns  Assist level: Supervision/Verbal cueing Assistive device: Walker-rolling   Walk 50 feet activity   Assist Walk 50 feet with 2 turns activity did not occur: Safety/medical concerns         Walk 150 feet activity   Assist Walk 150 feet activity did not occur: Safety/medical concerns         Walk 10 feet on uneven surface  activity   Assist Walk 10 feet on uneven surfaces activity did not occur: Safety/medical concerns         Wheelchair     Assist Will patient use wheelchair at discharge?: Yes Type of Wheelchair: Manual    Wheelchair assist level: Supervision/Verbal cueing Max wheelchair distance: 150(1 short rest break)    Wheelchair 50 feet with 2 turns activity    Assist        Assist Level: Supervision/Verbal cueing   Wheelchair 150 feet activity     Assist     Assist Level: Supervision/Verbal cueing    Medical Problem List and Plan: 1.  Functional deficits secondary to right trimalleolar ankle fracture and acute on chronic respiratory failure.    Continue CIR   Patient noncompliant with WB restriction, advanced to weightbearing as tolerated with cam boot per Ortho 2.  Antithrombotics: -DVT/anticoagulation:  Pharmaceutical: Lovenox             -antiplatelet therapy: N/A. 3. Pain Management:  Controlled with prn tylenol and robaxin.             Monitor with increased mobility   Paresthesias the distribution  is atypical for neuropathy gabapentin resumed at 300 3 times daily 4. Mood: LCSW to follow for evaluation and support.              -antipsychotic agents: N/A Hx depression d/ced Effexor, restared Celexa 5. Neuropsych: This patient is capable of making decisions on her own behalf. 6. Skin/Wound Care: Routine pressure relief measures. Skin area is not psoriasis, possible seborrhea, will f/u with derm as OP 7. Fluids/Electrolytes/Nutrition: Monitor I/O.     8. H/o Lung cancer/COPD- O2 dependent:   Continue Dulera bid with duonebs prn.   Resp virus testing (including SARS Covid 2 neg)  IS and Flutter valve  Per Cards - hypoxia, recommend work-up for PE.  VQ scan ordered, negative for PE. 9.  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  Continue Opsumit and Selexipag. On Lipitor.   Cards consulted for shortness of breath and believed it was secondary to fluid overload.  Diuretics, labs ordered.  Appreciate recs   10. T2DM;  Continue Levemir and titrate as needed. Off Trulicity at this time.  Meal coverage as at home and use SSI for elevated BS. Continue to monitor BS ac/hs.  Monitor with increased mobility. CBG (last 3)  Recent Labs  12/11/18 1716 12/11/18 2122 12/12/18 0606  GLUCAP 195* 227* 156*   -increased to 28U on 5/29  Labile on 5/31, monitor for trend 11. CKD:  Continue to monitor renal status with serial checks.   Creatinine 1.43 on 5/30, labs ordered for tomorrow given IV diuresis  Continue to monitor 12. Chronic hypotension: Monitor BP tid. On midodrine   Relatively controlled on 5/31 13. Acute on chronic iron deficiency anemia: Baseline Hgb in 9 range. Has improved past transfusion. Added iron supplement. Will continue to monitor.  Hemoglobin 8.8 on 5/25 14. Right trimalleolar ankle fracture:   CAM at all times, may loosen while in bed periodically for comfort  LOS: 11 days A FACE TO FACE EVALUATION WAS PERFORMED   Lorie Phenix 12/12/2018,  11:45 AM

## 2018-12-12 NOTE — Progress Notes (Signed)
Advanced Heart Failure Rounding Note   Subjective:    Diuresing well on lasix 40 IV bid. But continues to put the fluid right back in and weight going up.  When I came in the room to talk to her she had 7 or 8 drinks on her table and was struggling to open a cranberry juice and wouldn't put it down to even talk to me.   Denies SOB, orthopnea or PND. Threatening to sign out AMA.   Objective:   Weight Range:  Vital Signs:   Temp:  [97.9 F (36.6 C)-98.2 F (36.8 C)] 97.9 F (36.6 C) (05/31 0410) Pulse Rate:  [76-80] 76 (05/31 0410) Resp:  [19] 19 (05/31 0410) BP: (112-114)/(58-68) 114/58 (05/31 0410) SpO2:  [92 %-96 %] 94 % (05/31 0812) Weight:  [89 kg] 89 kg (05/31 0410) Last BM Date: 12/10/18  Weight change: Filed Weights   12/10/18 0503 12/11/18 0521 12/12/18 0410  Weight: 88 kg 87 kg 89 kg    Intake/Output:   Intake/Output Summary (Last 24 hours) at 12/12/2018 1435 Last data filed at 12/12/2018 1400 Gross per 24 hour  Intake 720 ml  Output 1675 ml  Net -955 ml     Physical Exam: General:  Elderly woman sitting in chair trying to open a cranberry juice container HEENT: normal Neck: supple. CVP 9. Carotids 2+ bilat; no bruits. No lymphadenopathy or thryomegaly appreciated. Cor: PMI nondisplaced. Regular rate & rhythm. No rubs, gallops or murmurs. Lungs: clear Abdomen: soft, nontender, nondistended. No hepatosplenomegaly. No bruits or masses. Good bowel sounds. Extremities: no cyanosis, clubbing, rash, 1-2+ edema + RLE boot Neuro: alert & orientedx3, cranial nerves grossly intact. moves all 4 extremities w/o difficulty. Affect pleasant   Labs: Basic Metabolic Panel: Recent Labs  Lab 12/06/18 0604 12/11/18 0546  NA 138 139  K 4.4 4.3  CL 102 101  CO2 26 26  GLUCOSE 199* 175*  BUN 34* 45*  CREATININE 1.29* 1.43*  CALCIUM 9.3 9.2  MG  --  2.2    Liver Function Tests: No results for input(s): AST, ALT, ALKPHOS, BILITOT, PROT, ALBUMIN in the last  168 hours. No results for input(s): LIPASE, AMYLASE in the last 168 hours. No results for input(s): AMMONIA in the last 168 hours.  CBC: Recent Labs  Lab 12/06/18 0604  WBC 10.1  HGB 8.8*  HCT 29.5*  MCV 80.6  PLT 280    Cardiac Enzymes: No results for input(s): CKTOTAL, CKMB, CKMBINDEX, TROPONINI in the last 168 hours.  BNP: BNP (last 3 results) Recent Labs    03/11/18 1526 04/09/18 1749  BNP 246.9* 297.8*    ProBNP (last 3 results) No results for input(s): PROBNP in the last 8760 hours.    Other results:  Imaging: Nm Pulmonary Perfusion  Result Date: 12/10/2018 CLINICAL DATA:  History of interstitial lung disease. Status post ankle fracture with ongoing hypoxia and chronic kidney disease. EXAM: NUCLEAR MEDICINE PERFUSION LUNG SCAN TECHNIQUE: Perfusion images were obtained in multiple projections after intravenous injection of radiopharmaceutical. Ventilation scans intentionally deferred if perfusion scan and chest x-ray adequate for interpretation during COVID 19 epidemic. RADIOPHARMACEUTICALS:  1.6 mCi Tc-80mMAA IV COMPARISON:  None FINDINGS: There is a uniform distribution of the radiopharmaceutical within both lungs. No segmental perfusion defects identified to suggest an acute pulmonary embolus. IMPRESSION: 1. No evidence for acute pulmonary embolus. Electronically Signed   By: TKerby MoorsM.D.   On: 12/10/2018 15:27     Medications:  Scheduled Medications: . atorvastatin  40 mg Oral Daily  . citalopram  10 mg Oral Daily  . dextromethorphan-guaiFENesin  2 tablet Oral BID  . docusate sodium  100 mg Oral BID  . enoxaparin (LOVENOX) injection  40 mg Subcutaneous Q24H  . furosemide  40 mg Intravenous BID  . gabapentin  300 mg Oral TID  . insulin aspart  0-15 Units Subcutaneous TID WC  . insulin aspart  3 Units Subcutaneous TID WC  . insulin detemir  28 Units Subcutaneous QHS  . ipratropium-albuterol  3 mL Nebulization TID  . iron polysaccharides  150  mg Oral BID  . macitentan  10 mg Oral Daily  . midodrine  5 mg Oral TID WC  . mometasone-formoterol  2 puff Inhalation BID  . potassium chloride  20 mEq Oral BID  . Selexipag  400 mcg Oral BID  . sodium chloride flush  3 mL Intravenous Q12H    Infusions:   PRN Medications: acetaminophen, alum & mag hydroxide-simeth, bisacodyl, diphenhydrAMINE, guaiFENesin-dextromethorphan, methocarbamol, polyethylene glycol, prochlorperazine **OR** prochlorperazine **OR** prochlorperazine, sodium chloride, sodium chloride flush, sodium phosphate, traZODone   Assessment/Plan:    1. Acute on chronic diastolic CHF: - Diuresing well on lasix 40 IV bid but continue to put fluid right back in and then some. Increase lasix to 80IV bid and add metolazone 5 daily - BMET am - Monitor strict I&Os and daily weights.  - Monitor BP with diuresis given history of hypotension  2. Pulmonary HTN:  - Continue selexipag and macitentan at current doses  3. Acute on chronic hypoxemic respiratory failure: likely 2/2 #1 at this time.  - VQ negative on 12/10/18 - Continue supplemental O2 via [place] - Continue pulmonary toileting - Continue diuresis as above  4. Orthostatic hypotension: BP overall stable on midodrine - Monitor BP closely with diuresis - Continue midodrine    Length of Stay: 11   Daniel Bensimhon MD 12/12/2018, 2:35 PM  Advanced Heart Failure Team Pager [phone] (M-F; 7a - 4p)  Please contact [facility] for night-coverage after hours (4p -7a ) and weekends on [email or web address]

## 2018-12-12 NOTE — Progress Notes (Signed)
Occupational Therapy Session Note  Patient Details  Name: Amy Shepard MRN: [id number] Date of Birth: October 11, 1945  Today's Date: 12/12/2018 OT Missed Time: 39 Minutes Missed Time Reason: Patient unwilling/refused to participate without medical reason  Short Term Goals: Week 2:  OT Short Term Goal 1 (Week 2): STGs=LTGs secondary to upcoming discharge date     Skilled Therapeutic Interventions/Progress Updates:    Pt seen after discussion with cardiologist. She reported feeling upset and refusing to engage in tx. Unable to be encouraged or redirected to participate. 75 minutes missed.   Therapy Documentation Precautions:  Precautions Precautions: Fall Required Braces or Orthoses: Other Brace Other Brace: R CAM boot Restrictions Weight Bearing Restrictions: Yes RLE Weight Bearing: Weight bearing as tolerated Vital Signs: Therapy Vitals Temp: 98.1 F (36.7 C) Temp Source: Oral Pulse Rate: 78 Resp: 20 BP: 124/61 Patient Position (if appropriate): Sitting Oxygen Therapy O2 Device: Room Air O2 Flow Rate (L/min): 4 L/min ADL:    Therapy/Group: Individual Therapy  Ventura Leggitt A Berk Pilot 12/12/2018, 4:42 PM

## 2018-12-13 ENCOUNTER — Inpatient Hospital Stay (HOSPITAL_COMMUNITY): Payer: Medicare Other | Admitting: Physical Therapy

## 2018-12-13 ENCOUNTER — Inpatient Hospital Stay (HOSPITAL_COMMUNITY): Payer: Medicare Other

## 2018-12-13 ENCOUNTER — Inpatient Hospital Stay (HOSPITAL_COMMUNITY): Payer: Medicare Other | Admitting: Occupational Therapy

## 2018-12-13 LAB — CBC
HCT: 32.8 % — ABNORMAL LOW (ref 36.0–46.0)
Hemoglobin: 9.6 g/dL — ABNORMAL LOW (ref 12.0–15.0)
MCH: 24.3 pg — ABNORMAL LOW (ref 26.0–34.0)
MCHC: 29.3 g/dL — ABNORMAL LOW (ref 30.0–36.0)
MCV: 83 fL (ref 80.0–100.0)
Platelets: 243 10*3/uL (ref 150–400)
RBC: 3.95 MIL/uL (ref 3.87–5.11)
RDW: 21.4 % — ABNORMAL HIGH (ref 11.5–15.5)
WBC: 6.9 10*3/uL (ref 4.0–10.5)
nRBC: 0 % (ref 0.0–0.2)

## 2018-12-13 LAB — BASIC METABOLIC PANEL
Anion gap: 11 (ref 5–15)
BUN: 64 mg/dL — ABNORMAL HIGH (ref 8–23)
CO2: 28 mmol/L (ref 22–32)
Calcium: 9.6 mg/dL (ref 8.9–10.3)
Chloride: 98 mmol/L (ref 98–111)
Creatinine, Ser: 2.06 mg/dL — ABNORMAL HIGH (ref 0.44–1.00)
GFR calc Af Amer: 27 mL/min — ABNORMAL LOW (ref >60)
GFR calc non Af Amer: 23 mL/min — ABNORMAL LOW (ref >60)
Glucose, Bld: 398 mg/dL — ABNORMAL HIGH (ref 70–99)
Potassium: 5.4 mmol/L — ABNORMAL HIGH (ref 3.5–5.1)
Sodium: 137 mmol/L (ref 135–145)

## 2018-12-13 LAB — GLUCOSE, CAPILLARY
Glucose-Capillary: 161 mg/dL — ABNORMAL HIGH (ref 70–99)
Glucose-Capillary: 253 mg/dL — ABNORMAL HIGH (ref 70–99)
Glucose-Capillary: 94 mg/dL (ref 70–99)
Glucose-Capillary: 156 mg/dL — ABNORMAL HIGH (ref 70–99)

## 2018-12-13 MED ORDER — IPRATROPIUM-ALBUTEROL 0.5-2.5 (3) MG/3ML IN SOLN
3.0000 mL | Freq: Two times a day (BID) | RESPIRATORY_TRACT | Status: DC
  Administered 2018-12-13 – 2018-12-14 (×2): 3 mL via RESPIRATORY_TRACT
  Filled 2018-12-13 (×2): qty 3

## 2018-12-13 MED ORDER — INSULIN DETEMIR 100 UNIT/ML ~~LOC~~ SOLN
30.0000 [IU] | Freq: Every day | SUBCUTANEOUS | Status: DC
  Administered 2018-12-13: 30 [IU] via SUBCUTANEOUS
  Filled 2018-12-13 (×2): qty 0.3

## 2018-12-13 NOTE — Progress Notes (Signed)
Physical Therapy Session Note  Patient Details  Name: Amy Shepard MRN: [id number] Date of Birth: Nov 20, 1945  Today's Date: 12/13/2018 PT Individual Time: 8502-7741 PT Individual Time Calculation (min): 28 min   Short Term Goals: Week 1:  PT Short Term Goal 1 (Week 1): Pt will perform bed mobility with supervision assist consistently  PT Short Term Goal 1 - Progress (Week 1): Met PT Short Term Goal 2 (Week 1): Pt will performed SB transfer wit supervision assist PT Short Term Goal 2 - Progress (Week 1): Met PT Short Term Goal 3 (Week 1): Pt will propell WC 175f with supervision assist and no rest breaks  PT Short Term Goal 3 - Progress (Week 1): Progressing toward goal PT Short Term Goal 4 (Week 1): Pt will perform sit<>stand with min assist and maintain NWB with LRAD  PT Short Term Goal 4 - Progress (Week 1): Met Week 2:  PT Short Term Goal 1 (Week 2): STG=LTG  due to ELOS  Skilled Therapeutic Interventions/Progress Updates:    Session focused on education in regards to d/c planning, home set-up for promotion of energy conservation and safe mobility (pt initially unclear why she needs a w/c for home) and w/c parts management training for leg rests (including on/off and elevating/lowering). Discussed potential to leave L one off at home if necessary due to difficulty with management but R one recommended for elevation of RLE for edema control. Pt reports having 2 walkers at home. Recommended placing one in each part of the home most likely to need (ex. Bathroom and living space, etc) to decrease need to transport when using w/c. Pt denies concerns.   Pt with noted productive cough x 2 during session requiring extra time to regain composure/breath.   Therapy Documentation Precautions:  Precautions Precautions: Fall Required Braces or Orthoses: Other Brace Other Brace: R CAM boot Restrictions Weight Bearing Restrictions: Yes RLE Weight Bearing: Weight bearing as  tolerated  Pain: Pain Assessment Pain Scale: 0-10 Pain Score: 0-No pain    Therapy/Group: Individual Therapy  GCanary BrimBIvory Shepard PT, DPT, CBIS  12/13/2018, 1:30 PM

## 2018-12-13 NOTE — Progress Notes (Signed)
Physical Therapy Session Note  Patient Details  Name: Amy Shepard MRN: [id number] Date of Birth: 1946/06/28  Today's Date: 12/13/2018 PT Individual Time: 3785-8850 and 1105-1200 PT Individual Time Calculation (min): 40 min and 55 min  Short Term Goals: Week 2:  PT Short Term Goal 1 (Week 2): STG=LTG  due to ELOS  Skilled Therapeutic Interventions/Progress Updates: Tx1: Pt presented in w/c agreeable to therapy. Pt performed w/c mobility approx 116f with x 3 rest breaks, pt demonstrating no signs of dyspnea with propulsion. SpO2 after propulsion 88% on 4L supplemental O2. Pt then transported remaining distance to ortho gym and performed car transfer supervision level to compact level seat height. Pt was able to apply breaks and release leg rests without cues. SpO2 checked after transfer into car 82% requiring approx 3 min for recovery to low 90s. Discussed energy conservation and recommended using w/c once out of vehicle to transport to unit. Pt verbalized understanding. Once pt out of car ambulated approx 162fwith RW and supervision assist with total A for O2 tank management. SpO2 desat to 87% with recovery within 20 seconds. Pt transported back to room and remained in w/c at end of session with call bell within reach, seat alarm on, and needs met.   Tx2: Pt presented in w/c agreeable to therapy. Pt requesting to use toilet. Transferred w/c to bathroom and performed stand pivot with RW to toilet supervision assist and performed clothing management CGA to close S with some posterior lean noted that pt was able to correct without cues (+BM). Pt returned to w/c in same manner as prior and moved to sink to perform hand hygiene. Pt then with increased bouts of coughing however now productive. Pt performed ambulatory transfer with RW to bed and performed bed mobility with use of bed features mod I with CAM boot on. Pt returned to w/c in same manner. Pt performed ambulation on uneven surface with CGA,  RW and increased time. SpO2 monitored and desat to 80% after ambulation with recovery >1 minute. Provided education on continued energy conservation upon d/c and safety with O2 line management upon d/c. Pt remained in w/c at end of session and left with call bell within reach, seat alarm on, and needs met.      Therapy Documentation Precautions:  Precautions Precautions: Fall Required Braces or Orthoses: Other Brace Other Brace: R CAM boot Restrictions Weight Bearing Restrictions: Yes RLE Weight Bearing: Weight bearing as tolerated General:   Vital Signs: Therapy Vitals Temp: (!) 97.5 F (36.4 C) Temp Source: Oral Pulse Rate: 82 BP: (!) 114/50 Patient Position (if appropriate): Sitting Oxygen Therapy SpO2: 93 % O2 Device: Nasal Cannula O2 Flow Rate (L/min): 3.5 L/min Pain:   Mobility:   Locomotion :    Trunk/Postural Assessment :    Balance: Balance Balance Assessed: Yes Dynamic Sitting Balance Dynamic Sitting - Balance Support: During functional activity(doffing/donning Lt gripper sock) Dynamic Sitting - Level of Assistance: 5: Stand by assistance Dynamic Standing Balance Dynamic Standing - Balance Support: During functional activity;Left upper extremity supported Dynamic Standing - Level of Assistance: 5: Stand by assistance Dynamic Standing - Balance Activities: Lateral lean/weight shifting;Forward lean/weight shifting Dynamic Standing - Comments: Simulated toileting tasks Exercises:   Other Treatments:      Therapy/Group: Individual Therapy  Tyreik Delahoussaye  Patryk Conant, PTA  12/13/2018, 4:31 PM

## 2018-12-13 NOTE — Progress Notes (Signed)
Social Work Patient ID: Amy Shepard, female   DOB: November 09, 1945, 73 y.o.   MRN: [id number] Met with pt to discuss how she is getting home tomorrow. She is unsure, have contacted her daughter to see if she can transport her home. Equipment to be delivered to her home due to hospital bed and wheelchair. Pt may need to go ambulance due to her need for O2 if can not find a way home for her.

## 2018-12-13 NOTE — Discharge Summary (Addendum)
Occupational Therapy Discharge Summary  Patient Details  Name: EULOGIA DISMORE MRN: [id number] Date of Birth: 1946/06/06  Today's Date: 12/13/2018 OT Individual Time: 1350-1500 OT Individual Time Calculation (min): 70 min   Patient has met 5 of 8 long term goals due to improved activity tolerance, improved balance and ability to compensate for deficits.  Patient to discharge at overall Supervision level.  Patient has been educated that OT strongly recommends having 24/7 supervision at time of d/c. Education has been provided to utilize functional stand pivot vs ambulatory transfer technique to minimize fall risk, as well as using BSC for nighttime toileting at home. Pt reports she is piecing together intermittent supervision and assist for IADL tasks.    Reasons goals not met: Pt still requires vcs for safe 02 + w/c mgt during self care tasks, and therefore unable to meet 3 goals set at Mod I level.    Recommendation:  Patient will benefit from ongoing skilled OT services in home health setting to continue to advance functional skills in the area of BADL and iADL.  Equipment: BSC and hospital bed  Reasons for discharge: discharge from hospital  Patient/family agrees with progress made and goals achieved: Yes   Pt greeted in w/c and premedicated for pain. Already bathed and dressed, but amenable to engage in simulation of these tasks w/c level at the sink, sit<stand. She required vcs to lock w/c brakes during dynamic activity. Vcs also required for mgt of leg rests, taking them off completely and then putting them back on. Pt unable to independently problem solve when she had put on her leg rest upside down. When using theraband to simulate pants, pt required vcs to dress R LE first, as she was unable to meet task demands when dressing affected LE second. Supervision for dynamic standing balance, and also for stand pivot<BSC. 02 sats decreased during LB tasks (82-83% on 4L). Advised pt to  increase 02 during functional activity and to carry a pulse ox to frequently check her sats at home. It took about a minute for sats to increase to 91-93% range with diaphragmatic breathing. Discussed w/c level meal prep with microwavable meals. She reports grandson will do the grocery shopping for her and help out with laundry. Per pt, she will have a HHA come into the house during the week to do the cleaning. She says the pharmacy will deliver her medication. Recommended stand pivot vs ambulatory toilet transfers and BSC use for nighttime toileting. Strongly educated pt to sponge bathe w/c level vs attempt shower herself. Advised pursuing shower goal with health therapist post d/c when appropriate. Also recommended purchase of life alert necklace. Pt aware that OT recommends 24/7 supervision post d/c. She still verbalizes that this is not feasible due to lack of family support. At end of session pt was left in w/c with chair alarm set and all needs within reach.     OT Discharge Precautions/Restrictions  Precautions Precautions: Fall Required Braces or Orthoses: Other Brace Other Brace: R CAM boot Restrictions Weight Bearing Restrictions: Yes RLE Weight Bearing: Weight bearing as tolerated Vital Signs Therapy Vitals Temp: (!) 97.5 F (36.4 C) Temp Source: Oral Pulse Rate: 82 BP: (!) 114/50 Patient Position (if appropriate): Sitting Oxygen Therapy SpO2: 93 % O2 Device: Nasal Cannula O2 Flow Rate (L/min): 3.5 L/min ADL ADL Eating: Independent Grooming: Independent Where Assessed-Grooming: Sitting at sink Upper Body Bathing: Supervision/safety Where Assessed-Upper Body Bathing: Sitting at sink Lower Body Bathing: Supervision/safety Where Assessed-Lower Body Bathing: Sitting  at sink Upper Body Dressing: Setup Where Assessed-Upper Body Dressing: Sitting at sink Lower Body Dressing: Supervision/safety Where Assessed-Lower Body Dressing: Standing at sink, Sitting at sink Toileting:  Supervision/safety Where Assessed-Toileting: Bedside Commode Toilet Transfer: Close supervision Toilet Transfer Method: Stand pivot Science writer: Geophysical data processor: Not assessed ADL Comments: Bathing/dressing/toileting tasks simulated as pts ADLs needs were met prior to session  Vision Baseline Vision/History: Wears glasses Wears Glasses: Reading only Patient Visual Report: No change from baseline Perception  Perception: Within Functional Limits Praxis Praxis: Intact Cognition Overall Cognitive Status: Within Functional Limits for tasks assessed Arousal/Alertness: Awake/alert Orientation Level: Oriented X4 Problem Solving: Impaired Safety/Judgment: Impaired Comments: Pt does not think she needs 24/7 supervision despite having multiple medical issues and significant fall hx Sensation Sensation Light Touch: Impaired Detail Light Touch Impaired Details: Impaired RUE;Impaired LUE(Hx PN) Coordination Gross Motor Movements are Fluid and Coordinated: No Fine Motor Movements are Fluid and Coordinated: No Coordination and Movement Description: Affected by CAM boot and sensory deficits in hands Motor  Motor Motor: Other (comment) Motor - Discharge Observations: Generalized weakness persists, though improved since time of eval Mobility    Supervision for stand pivot BSC transfers using RW Balance Balance Balance Assessed: Yes Dynamic Sitting Balance Dynamic Sitting - Balance Support: During functional activity(doffing/donning Lt gripper sock) Dynamic Sitting - Level of Assistance: 5: Stand by assistance Dynamic Standing Balance Dynamic Standing - Balance Support: During functional activity;Left upper extremity supported Dynamic Standing - Level of Assistance: 5: Stand by assistance Dynamic Standing - Balance Activities: Lateral lean/weight shifting;Forward lean/weight shifting Dynamic Standing - Comments: Simulated toileting tasks Extremity/Trunk  Assessment RUE Assessment RUE Assessment: Within Functional Limits Active Range of Motion (AROM) Comments: WNL General Strength Comments: 3+ to 4-/5 grossly  LUE Assessment Active Range of Motion (AROM) Comments: WNL General Strength Comments: 3+ to 4-/5 grossly    Aurilla Coulibaly A Rumaysa Sabatino 12/13/2018, 4:20 PM

## 2018-12-13 NOTE — Progress Notes (Signed)
[place] PHYSICAL MEDICINE & REHABILITATION PROGRESS NOTE   Subjective/Complaints:  Feels ok this am, denies dizziness  ROS: Denies CP, SOB, N/V/D  Objective:   No results found. No results for input(s): WBC, HGB, HCT, PLT in the last 72 hours. Recent Labs    12/11/18 0546  NA 139  K 4.3  CL 101  CO2 26  GLUCOSE 175*  BUN 45*  CREATININE 1.43*  CALCIUM 9.2    Intake/Output Summary (Last 24 hours) at 12/13/2018 0725 Last data filed at 12/12/2018 2019 Gross per 24 hour  Intake 120 ml  Output 1025 ml  Net -905 ml     Physical Exam: Vital Signs Blood pressure (!) 114/50, pulse 78, temperature 98 F (36.7 C), temperature source Oral, resp. rate 16, height _0  (1.575 m), weight 89 kg, SpO2 91 %. Constitutional: No distress . Vital signs reviewed. HENT: Normocephalic.  Atraumatic. Eyes: EOMI.  No discharge. Cardiovascular: No JVD. Respiratory: Normal effort.  + [place]. GI: Non-distended. Musc: Cam boot right lower extremity. Lower extremity edema Neurologic: Alert and oriented Motor: 4+-5/5 bilateral upper extremities and left lower extremity, stable. Right lower extremity hip flexion, knee extension 4+/5, wiggles toes Sensation intact light touch  Assessment/Plan: 1. Functional deficits secondary to Right trimalleolar fracture which require 3+ hours per day of interdisciplinary therapy in a comprehensive inpatient rehab setting.  Physiatrist is providing close team supervision and 24 hour management of active medical problems listed below.  Physiatrist and rehab team continue to assess barriers to discharge/monitor patient progress toward functional and medical goals  Care Tool:  Bathing  Bathing activity did not occur: (seated on BSC) Body parts bathed by patient: Right arm, Left arm, Chest, Abdomen, Front perineal area, Face   Body parts bathed by helper: Buttocks, Right lower leg, Left lower leg     Bathing assist Assist Level: Set up assist     Upper  Body Dressing/Undressing Upper body dressing   What is the patient wearing?: Pull over shirt    Upper body assist Assist Level: Set up assist    Lower Body Dressing/Undressing Lower body dressing      What is the patient wearing?: Pants     Lower body assist Assist for lower body dressing: Maximal Assistance - Patient 25 - 49%     Toileting Toileting Toileting Activity did not occur (Clothing management and hygiene only): Refused  Toileting assist Assist for toileting: Maximal Assistance - Patient 25 - 49%     Transfers Chair/bed transfer  Transfers assist     Chair/bed transfer assist level: Supervision/Verbal cueing     Locomotion Ambulation   Ambulation assist   Ambulation activity did not occur: Safety/medical concerns  Assist level: Supervision/Verbal cueing Assistive device: Walker-rolling Max distance: 20   Walk 10 feet activity   Assist  Walk 10 feet activity did not occur: Safety/medical concerns  Assist level: Supervision/Verbal cueing Assistive device: Walker-rolling   Walk 50 feet activity   Assist Walk 50 feet with 2 turns activity did not occur: Safety/medical concerns         Walk 150 feet activity   Assist Walk 150 feet activity did not occur: Safety/medical concerns         Walk 10 feet on uneven surface  activity   Assist Walk 10 feet on uneven surfaces activity did not occur: Safety/medical concerns         Wheelchair     Assist Will patient use wheelchair at discharge?: Yes Type of Wheelchair:  Manual    Wheelchair assist level: Supervision/Verbal cueing Max wheelchair distance: 150(1 short rest break)    Wheelchair 50 feet with 2 turns activity    Assist        Assist Level: Supervision/Verbal cueing   Wheelchair 150 feet activity     Assist     Assist Level: Supervision/Verbal cueing    Medical Problem List and Plan: 1.  Functional deficits secondary to right trimalleolar ankle  fracture and acute on chronic respiratory failure.    Continue CIR -Planned d/c for am will ask cardiology for f/u recs  Patient noncompliant with WB restriction, advanced to weightbearing as tolerated with cam boot per Ortho 2.  Antithrombotics: -DVT/anticoagulation:  Pharmaceutical: Lovenox             -antiplatelet therapy: N/A. 3. Pain Management:  Controlled with prn tylenol and robaxin.             Monitor with increased mobility   Paresthesias the distribution is atypical for neuropathy gabapentin resumed at 300 3 times daily 4. Mood: LCSW to follow for evaluation and support.              -antipsychotic agents: N/A Hx depression d/ced Effexor, restared Celexa 5. Neuropsych: This patient is capable of making decisions on her own behalf. 6. Skin/Wound Care: Routine pressure relief measures. Skin area is not psoriasis, possible seborrhea, will f/u with derm as OP 7. Fluids/Electrolytes/Nutrition: Monitor I/O.     8. H/o Lung cancer/COPD- O2 dependent:   Continue Dulera bid with duonebs prn.   Resp virus testing (including SARS Covid 2 neg)  IS and Flutter valve  Per Cards - hypoxia, recommend work-up for PE.  VQ scan ordered, negative for PE. 9.  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  Continue Opsumit and Selexipag. On Lipitor.   Cards consulted for shortness of breath and believed it was secondary to fluid overload.  Diuretics, labs ordered.  Appreciate recs   10. T2DM;  Continue Levemir and titrate as needed. Off Trulicity at this time.  Meal coverage as at home and use SSI for elevated BS. Continue to monitor BS ac/hs.  Monitor with increased mobility. CBG (last 3)  Recent Labs    12/12/18 1710 12/12/18 2125 12/13/18 0627  GLUCAP 193* 232* 161*   -increased to 28U on 5/29 will increase to 30U 6/1   11. CKD:  Continue to monitor renal status with serial checks.   Creatinine 1.43 on 5/30, labs ordered for tomorrow given IV diuresis  Continue to  monitor 12. Chronic hypotension: Monitor BP tid. On midodrine  Vitals:   12/13/18 0601 12/13/18 0722  BP: (!) 114/50   Pulse: 78   Resp: 16   Temp: 98 F (36.7 C)   SpO2: 91% 92%  Stable despite aggressive diuresis 13. Acute on chronic iron deficiency anemia: Baseline Hgb in 9 range. Has improved past transfusion. Added iron supplement. Will continue to monitor.  Hemoglobin 8.8 on 5/25 14. Right trimalleolar ankle fracture:   CAM at all times, may loosen while in bed periodically for comfort  LOS: 12 days A FACE TO Meggett E Crissa Sowder 12/13/2018, 7:25 AM

## 2018-12-13 NOTE — Progress Notes (Signed)
Physical Therapy Discharge Summary  Patient Details  Name: Amy Shepard MRN: [id number] Date of Birth: 26-Aug-1945  Today's Date: 12/13/2018    Patient has met 8 of 8 long term goals due to improved activity tolerance, improved balance, increased strength, decreased pain, improved attention and improved awareness.  Patient to discharge at a wheelchair level Supervision.   Patient's care partner unavailable to provide the necessary pt lives at home and will receive intermittent supervision assistance at discharge.  Reasons goals not met: N/A   Recommendation:  Patient will benefit from ongoing skilled PT services in home health setting to continue to advance safe functional mobility, address ongoing impairments in strength, balance, gait, endurance, activity tolerance, functional mobility, and minimize fall risk.  Equipment: 18x16 wheelchair with ELR  Reasons for discharge: treatment goals met  Patient/family agrees with progress made and goals achieved: Yes  PT Discharge Precautions/Restrictions Precautions Precautions: Fall Required Braces or Orthoses: Other Brace Other Brace: R CAM boot Restrictions Weight Bearing Restrictions: Yes RLE Weight Bearing: Weight bearing as tolerated Vision/Perception  Perception Perception: Within Functional Limits Praxis Praxis: Intact  Cognition Overall Cognitive Status: Within Functional Limits for tasks assessed Arousal/Alertness: Awake/alert Orientation Level: Oriented X4 Problem Solving: Impaired Safety/Judgment: Impaired Comments: Pt does not think she needs 24/7 supervision despite having multiple medical issues and significant fall hx Sensation Sensation Light Touch: Impaired Detail Light Touch Impaired Details: Impaired RUE;Impaired LUE(Hx PN) Coordination Gross Motor Movements are Fluid and Coordinated: No Fine Motor Movements are Fluid and Coordinated: No Coordination and Movement Description: Affected by CAM boot and  sensory deficits in hands Motor  Motor Motor: Other (comment) Motor - Discharge Observations: Generalized weakness persists, though improved since time of eval  Mobility Bed Mobility Bed Mobility: Rolling Right;Rolling Left;Supine to Sit;Sit to Supine Rolling Right: Independent with assistive device Rolling Left: Independent with assistive device Supine to Sit: Independent with assistive device Transfers Transfers: Sit to Stand;Stand to Sit Sit to Stand: Supervision/Verbal cueing Stand to Sit: Supervision/Verbal cueing Locomotion  Gait Ambulation: Yes Gait Assistance: Supervision/Verbal cueing Gait Distance (Feet): 15 Feet Assistive device: Rolling walker Stairs / Additional Locomotion Stairs: No Wheelchair Mobility Wheelchair Mobility: Yes Wheelchair Assistance: Independent with Camera operator: Both upper extremities Wheelchair Parts Management: Supervision/cueing Distance: 150  Trunk/Postural Assessment  Cervical Assessment Cervical Assessment: Exceptions to WFL(forward head) Thoracic Assessment Thoracic Assessment: Exceptions to WFL(kyphotic) Lumbar Assessment Lumbar Assessment: Exceptions to WFL(posterior pelvic tilt) Postural Control Postural Control: Deficits on evaluation  Balance Balance Balance Assessed: Yes Dynamic Sitting Balance Dynamic Sitting - Balance Support: During functional activity(doffing/donning Lt gripper sock) Dynamic Sitting - Level of Assistance: 5: Stand by assistance Dynamic Standing Balance Dynamic Standing - Balance Support: During functional activity;Left upper extremity supported Dynamic Standing - Level of Assistance: 5: Stand by assistance Dynamic Standing - Balance Activities: Lateral lean/weight shifting;Forward lean/weight shifting Dynamic Standing - Comments: Simulated toileting tasks Extremity Assessment  RUE Assessment RUE Assessment: Within Functional Limits Active Range of Motion (AROM) Comments:  WNL General Strength Comments: 3+ to 4-/5 grossly  LUE Assessment Active Range of Motion (AROM) Comments: WNL General Strength Comments: 3+ to 4-/5 grossly  RLE Assessment RLE Assessment: Exceptions to [facility] General Strength Comments: grossly 4/+5 proximal to distal with cam boot at ankle  LLE Assessment LLE Assessment: Within Functional Limits    Rosita DeChalus 12/13/2018, 4:41 PM   Lars Masson, PT, DPT, CBIS 12/14/18

## 2018-12-13 NOTE — Progress Notes (Signed)
Advanced Heart Failure Rounding Note   Subjective:    Lasix increased to 80 IV bid and metolazone added. Reports good output but not well charted. No weight on chart.   Denies SOB, orthopnea or PND. Says she is trying to drink less. Creatinine up slightly 1.4 -> 2.0   Objective:   Weight Range:  Vital Signs:   Temp:  [97.5 F (36.4 C)-98 F (36.7 C)] 97.5 F (36.4 C) (06/01 1456) Pulse Rate:  [78-82] 82 (06/01 1456) Resp:  [16] 16 (06/01 0601) BP: (114-135)/(50-68) 114/50 (06/01 1456) SpO2:  [91 %-93 %] 93 % (06/01 1456) Last BM Date: 12/13/18  Weight change: Filed Weights   12/10/18 0503 12/11/18 0521 12/12/18 0410  Weight: 88 kg 87 kg 89 kg    Intake/Output:   Intake/Output Summary (Last 24 hours) at 12/13/2018 1703 Last data filed at 12/13/2018 1653 Gross per 24 hour  Intake 350 ml  Output 1200 ml  Net -850 ml     Physical Exam: General:  Sitting in chair. No resp difficulty HEENT: normal Neck: supple. JVP 6 Carotids 2+ bilat; no bruits. No lymphadenopathy or thryomegaly appreciated. Cor: PMI nondisplaced. Regular rate & rhythm. No rubs, gallops or murmurs. Lungs: clear Abdomen: soft, nontender, nondistended. No hepatosplenomegaly. No bruits or masses. Good bowel sounds. Extremities: no cyanosis, clubbing, rash, edema R boot  Neuro: alert & orientedx3, cranial nerves grossly intact. moves all 4 extremities w/o difficulty. Affect pleasant    Labs: Basic Metabolic Panel: Recent Labs  Lab 12/11/18 0546 12/13/18 0837  NA 139 137  K 4.3 5.4*  CL 101 98  CO2 26 28  GLUCOSE 175* 398*  BUN 45* 64*  CREATININE 1.43* 2.06*  CALCIUM 9.2 9.6  MG 2.2  --     Liver Function Tests: No results for input(s): AST, ALT, ALKPHOS, BILITOT, PROT, ALBUMIN in the last 168 hours. No results for input(s): LIPASE, AMYLASE in the last 168 hours. No results for input(s): AMMONIA in the last 168 hours.  CBC: Recent Labs  Lab 12/13/18 0837  WBC 6.9  HGB 9.6*  HCT  32.8*  MCV 83.0  PLT 243    Cardiac Enzymes: No results for input(s): CKTOTAL, CKMB, CKMBINDEX, TROPONINI in the last 168 hours.  BNP: BNP (last 3 results) Recent Labs    03/11/18 1526 04/09/18 1749  BNP 246.9* 297.8*    ProBNP (last 3 results) No results for input(s): PROBNP in the last 8760 hours.    Other results:  Imaging: No results found.   Medications:     Scheduled Medications: . atorvastatin  40 mg Oral Daily  . citalopram  10 mg Oral Daily  . dextromethorphan-guaiFENesin  2 tablet Oral BID  . docusate sodium  100 mg Oral BID  . enoxaparin (LOVENOX) injection  40 mg Subcutaneous Q24H  . gabapentin  300 mg Oral TID  . insulin aspart  0-15 Units Subcutaneous TID WC  . insulin aspart  3 Units Subcutaneous TID WC  . insulin detemir  30 Units Subcutaneous QHS  . ipratropium-albuterol  3 mL Nebulization BID  . iron polysaccharides  150 mg Oral BID  . macitentan  10 mg Oral Daily  . midodrine  5 mg Oral TID WC  . mometasone-formoterol  2 puff Inhalation BID  . Selexipag  400 mcg Oral BID  . sodium chloride flush  3 mL Intravenous Q12H    Infusions:   PRN Medications: acetaminophen, alum & mag hydroxide-simeth, bisacodyl, diphenhydrAMINE, guaiFENesin-dextromethorphan, methocarbamol, polyethylene glycol,  prochlorperazine **OR** prochlorperazine **OR** prochlorperazine, sodium chloride, sodium chloride flush, sodium phosphate, traZODone   Assessment/Plan:    1. Acute on chronic diastolic CHF: - Volume status looks much better. Creatinine up some. Likely dry now.  - Hold IV lasix and metolazone.  - If creatinine better in am can go home on previous home regimen - d/w CIR team - BMET am - Emphasized need for fluid restriction   2. Pulmonary HTN:  - Continue selexipag and macitentan at current doses  3. Acute on chronic hypoxemic respiratory failure: likely 2/2 #1 at this time.  - VQ negative on 12/10/18 - Continue supplemental O2 via [place] -  Continue pulmonary toileting - Continue diuresis as above  4. Orthostatic hypotension: BP overall stable on midodrine - Monitor BP closely with diuresis - Continue midodrine   5. AKI - likely due to overdiuresis - As above.  Home tomorrow if creatinine on way back down.   Length of Stay: 12   Glori Bickers MD 12/13/2018, 5:03 PM  Advanced Heart Failure Team Pager [phone] (M-F; 7a - 4p)  Please contact [facility] for night-coverage after hours (4p -7a ) and weekends on [email or web address]

## 2018-12-14 ENCOUNTER — Telehealth: Payer: Self-pay

## 2018-12-14 LAB — BASIC METABOLIC PANEL
Anion gap: 12 (ref 5–15)
Anion gap: 12 (ref 5–15)
BUN: 64 mg/dL — ABNORMAL HIGH (ref 8–23)
BUN: 65 mg/dL — ABNORMAL HIGH (ref 8–23)
CO2: 29 mmol/L (ref 22–32)
CO2: 29 mmol/L (ref 22–32)
Calcium: 9.4 mg/dL (ref 8.9–10.3)
Calcium: 9.6 mg/dL (ref 8.9–10.3)
Chloride: 99 mmol/L (ref 98–111)
Chloride: 98 mmol/L (ref 98–111)
Creatinine, Ser: 1.53 mg/dL — ABNORMAL HIGH (ref 0.44–1.00)
Creatinine, Ser: 1.66 mg/dL — ABNORMAL HIGH (ref 0.44–1.00)
GFR calc Af Amer: 39 mL/min — ABNORMAL LOW (ref >60)
GFR calc Af Amer: 35 mL/min — ABNORMAL LOW (ref >60)
GFR calc non Af Amer: 34 mL/min — ABNORMAL LOW (ref >60)
GFR calc non Af Amer: 30 mL/min — ABNORMAL LOW (ref >60)
Glucose, Bld: 160 mg/dL — ABNORMAL HIGH (ref 70–99)
Glucose, Bld: 233 mg/dL — ABNORMAL HIGH (ref 70–99)
Potassium: 4.3 mmol/L (ref 3.5–5.1)
Potassium: 5.2 mmol/L — ABNORMAL HIGH (ref 3.5–5.1)
Sodium: 140 mmol/L (ref 135–145)
Sodium: 139 mmol/L (ref 135–145)

## 2018-12-14 LAB — GLUCOSE, CAPILLARY
Glucose-Capillary: 125 mg/dL — ABNORMAL HIGH (ref 70–99)
Glucose-Capillary: 243 mg/dL — ABNORMAL HIGH (ref 70–99)

## 2018-12-14 MED ORDER — DICLOFENAC SODIUM 1 % TD GEL: 2.0000 g | Freq: Four times a day (QID) | TRANSDERMAL | 1 refills | Status: DC

## 2018-12-14 MED ORDER — MIDODRINE HCL 5 MG PO TABS: 5.0000 mg | ORAL_TABLET | Freq: Three times a day (TID) | ORAL | 0 refills | Status: DC

## 2018-12-14 MED ORDER — POLYSACCHARIDE IRON COMPLEX 150 MG PO CAPS: 150.0000 mg | ORAL_CAPSULE | Freq: Two times a day (BID) | ORAL | 1 refills | Status: DC

## 2018-12-14 MED ORDER — CITALOPRAM HYDROBROMIDE 10 MG PO TABS: 10.0000 mg | ORAL_TABLET | Freq: Every day | ORAL | 0 refills | Status: DC

## 2018-12-14 MED ORDER — MOMETASONE FURO-FORMOTEROL FUM 200-5 MCG/ACT IN AERO: 2.0000 | INHALATION_SPRAY | Freq: Two times a day (BID) | RESPIRATORY_TRACT | 1 refills | Status: DC

## 2018-12-14 MED ORDER — SALINE SPRAY 0.65 % NA SOLN: 1.0000 | NASAL | 0 refills | Status: DC | PRN

## 2018-12-14 MED ORDER — INSULIN DETEMIR 100 UNIT/ML FLEXPEN: 30.0000 [IU] | PEN_INJECTOR | Freq: Every day | SUBCUTANEOUS | 11 refills | Status: DC

## 2018-12-14 MED ORDER — DOCUSATE SODIUM 100 MG PO CAPS: 100.0000 mg | ORAL_CAPSULE | Freq: Two times a day (BID) | ORAL | 0 refills | Status: DC

## 2018-12-14 MED ORDER — DICLOFENAC SODIUM 1 % TD GEL
2.0000 g | Freq: Four times a day (QID) | TRANSDERMAL | Status: DC
  Administered 2018-12-14 (×2): 2 g via TOPICAL
  Filled 2018-12-14: qty 100

## 2018-12-14 NOTE — Telephone Encounter (Signed)
Amy Shepard case mgr with [facility] called stated on VM patient needs post op appt with Dr Sharol Given. Please call patient to schedule.

## 2018-12-14 NOTE — Progress Notes (Signed)
Social Work Patient ID: Amy Shepard, female   DOB: [birth year], 73 y.o.   MRN: [id number] Met with pt to discuss transport home, since daughter has not returned my call form yesterday nor has she called pt. Will plan for ambulance transport home due to O2 and making sure she gets into the apartment. Awaiting medical clearance for DC today.

## 2018-12-14 NOTE — Progress Notes (Signed)
Social Work  Discharge Note  The overall goal for the admission was met for:   Discharge location: [place] BY TO CHECK ON HER  Length of Stay: Yes-12 DAYS  Discharge activity level: Yes-MOD/I LEVEL  Home/community participation: Yes  Services provided included: MD, RD, PT, OT, RN, CM, Pharmacy, Neuropsych and SW  Financial Services: Private Insurance: [facility]  Follow-up services arranged: Home Health: [facility]-PT,OT,RN, AIDE, DME: [facility] BED, WHEELCHAIR AND BSC and Patient/Family request agency HH: [facility], DME: [facility] HAS O2 FROM THEM  Comments (or additional information):PT HAS VERY LIMITED SUPPORTS HER CHILDREN WOULD NOT ANSWER PHONE TO [place]. PT IS MOD/I LEVEL NOW SHE IS WBAT, BUT WAS STILL DESATS HER O2. HAS HOME O2. HIGH RISK TO RETURN TO [place]. AMBULANCE TRANSPORT HOME  Patient/Family verbalized understanding of follow-up arrangements: Yes  Individual responsible for coordination of the follow-up plan: SELF   Confirmed correct DME delivered: [name] 12/14/2018    [name]

## 2018-12-14 NOTE — Discharge Summary (Addendum)
Physician Discharge Summary  Patient ID: Amy Shepard MRN: [id number] DOB/AGE: January 13, 1946 73 y.o.  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)   Discharged Condition: good  Significant Diagnostic Studies: Dg Chest 2 View  Result Date: 12/10/2018 CLINICAL DATA:  Left chest heaviness and productive cough, chronic. EXAM: CHEST - 2 VIEW COMPARISON:  PA and lateral chest 12/09/2018. Single-view of the chest 11/25/2018 and 01/29/2018. FINDINGS: Lungs are emphysematous. There is cardiomegaly and pulmonary vascular congestion. No consolidative process, pneumothorax or effusion. Aortic atherosclerosis is noted. No acute abnormality. IMPRESSION: No acute disease. Cardiomegaly and vascular congestion. COPD. Atherosclerosis. Electronically Signed   By: Inge Rise M.D.   On: 12/10/2018 14:35   Dg Chest 2 View  Result Date: 12/09/2018 CLINICAL DATA:  Shortness of breath. EXAM: CHEST - 2 VIEW COMPARISON:  Radiograph Nov 25, 2018. FINDINGS: Stable cardiomegaly. Atherosclerosis of thoracic aorta is noted. No pneumothorax or pleural effusion is noted. Mild central pulmonary vascular congestion is noted. Postsurgical changes are noted in the right lower lobe. Stable interstitial densities are noted throughout both lungs most consistent with chronic scarring, although acute superimposed edema or inflammation cannot be excluded. Bony thorax is unremarkable. IMPRESSION: Stable cardiomegaly. Postoperative changes seen in right lower lobe. Stable  interstitial densities are noted throughout both lungs most consistent with chronic scarring, although acute superimposed edema or inflammation cannot be excluded. Aortic Atherosclerosis (ICD10-I70.0). Electronically Signed   By: Marijo Conception M.D.   On: 12/09/2018 10:54   Dg Ankle 2 Views Right  Result Date: 11/22/2018 CLINICAL DATA:  Status post reduction for fracture/dislocation EXAM: RIGHT ANKLE - 2 VIEW COMPARISON:  Nov 22, 2018 study obtained earlier in the day FINDINGS: Frontal and lateral views obtained. Images obtained in plaster. There is a fracture the distal fibular diaphysis with mild posterior angulation distally. There is approximately 8 mm of overriding of fracture fragments. There is avulsion of the medial malleolus. Compared to study obtained earlier in the day, there is less ankle mortise disruption. The talus is no longer displaced entirely posterior to the tibial plafond. However, there does remain mild lateral and posterior displacement of the talus with respect to the plafond and. 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 IMPRESSION: There is a persistent degree of lateral and posterior displacement of the talus with respect to the tibial plafond. The gross posterior displacement of the talus with respect to the tibial plafond seen prior to reduction is no longer evident. There are again noted fractures of the distal fibula and medial malleolar regions. No new fractures are demonstrated. There may be slightly less displacement of the distal medial malleolar fracture fragment compared to the more proximal fragment in comparison with study obtained earlier in the day. Electronically Signed   By: Lowella Grip III M.D.   On: 11/22/2018 16:11   Dg Ankle Complete Right  Result Date: 11/22/2018 CLINICAL DATA:  Right ankle pain after fall. EXAM: RIGHT ANKLE - COMPLETE 3+ VIEW COMPARISON:  None. FINDINGS: Severely angulated and comminuted fracture of right distal  fibula is  noted. There is posterior dislocation of the talus relative to the tibia. Probable moderately displaced posterior malleolar fracture is noted. Moderately displaced medial malleolar fracture is noted as well. IMPRESSION: Posterior dislocation of talus relative to distal tibia with associated fractures of the distal right fibula, medial malleolus and posterior malleolus. Electronically Signed   By: Marijo Conception M.D.   On: 11/22/2018 14:48   Nm Pulmonary Perfusion  Result Date: 12/10/2018 CLINICAL DATA:  History of interstitial lung disease. Status post ankle fracture with ongoing hypoxia and chronic kidney disease. EXAM: NUCLEAR MEDICINE PERFUSION LUNG SCAN TECHNIQUE: Perfusion images were obtained in multiple projections after intravenous injection of radiopharmaceutical. Ventilation scans intentionally deferred if perfusion scan and chest x-ray adequate for interpretation during COVID 19 epidemic. RADIOPHARMACEUTICALS:  1.6 mCi Tc-69mMAA IV COMPARISON:  None FINDINGS: There is a uniform distribution of the radiopharmaceutical within both lungs. No segmental perfusion defects identified to suggest an acute pulmonary embolus. IMPRESSION: 1. No evidence for acute pulmonary embolus. Electronically Signed   By: TKerby MoorsM.D.   On: 12/10/2018 15:27   Dg Chest Port 1 View  Result Date: 11/25/2018 CLINICAL DATA:  73year old female with history of lung cancer. Fall. Shortness of breath. EXAM: PORTABLE CHEST 1 VIEW COMPARISON:  11/24/2018 and earlier. FINDINGS: Portable AP semi upright view at 0516 hours. Stable lung volumes. Stable cardiomegaly and mediastinal contours. Calcified aortic atherosclerosis. Visualized tracheal air column is within normal limits. Chronic postoperative changes in the right lower lung. Chronic increased pulmonary interstitial markings, mildly decreased coarse interstitial opacity since yesterday. Ventilation now appears to be at baseline including in the left lower lobe  where the hemidiaphragm is again visible. No pneumothorax or pleural effusion. No acute osseous abnormality identified. IMPRESSION: 1. Improved ventilation since yesterday, lungs now appear at baseline. 2. Cardiomegaly.  Aortic Atherosclerosis (ICD10-I70.0). Electronically Signed   By: HGenevie AnnM.D.   On: 11/25/2018 08:14   Dg Chest Port 1 View  Result Date: 11/24/2018 CLINICAL DATA:  Hypoxia. Shortness of breath and chest pain. History of lung cancer. EXAM: PORTABLE CHEST 1 VIEW COMPARISON:  CTA chest and chest x-ray dated August 18, 2018. FINDINGS: Portions of the left lung base are excluded from the field of view. Stable cardiomegaly. Atherosclerotic calcification of the aortic arch. Coarsened interstitial markings are similar to prior study. Postsurgical changes in the right lung again noted. No focal consolidation, pleural effusion, or pneumothorax. No acute osseous abnormality. IMPRESSION: No active disease. Stable cardiomegaly and chronic interstitial changes. Electronically Signed   By: WTitus DubinM.D.   On: 11/24/2018 10:05   Dg Toe Great Left  Result Date: 12/07/2018 CLINICAL DATA:  Left toe pain. EXAM: LEFT GREAT TOE COMPARISON:  04/10/2018. FINDINGS: Previous screw fixation of the base of the first metatarsal bone with the first cuneiform and navicular bone. There is peri-screw lucency and mild cortical bone loss at the level of the base of first proximal metatarsal bone. There are 2 focal bone erosions with overhanging edges involving the head of the second and third metatarsal bones. The joint spaces appear well preserved. IMPRESSION: 1. No acute fracture identified. 2. Status post hardware fusion of the first metatarsal with the first cuneiform and navicular bone. Here, there is peri screw lucency and suspected loss of bone at the base of the first metatarsal bone. If there is a clinical concern for loosening or infection consider further investigation with three-phase bone scintigraphy.  3. Small bone erosions with overhanging edges involve the head of  the second and third metatarsal bones. Correlation for any clinical signs or symptoms of inflammatory arthropathy including crystalline deposition disease. Electronically Signed   By: Kerby Moors M.D.   On: 12/07/2018 08:45    Labs:  Basic Metabolic Panel: Recent Labs  Lab 12/11/18 0546 12/13/18 0837 12/14/18 0715 12/14/18 0813  NA 139 137 140 139  K 4.3 5.4* 4.3 5.2*  CL 101 98 99 98  CO2 _0 GLUCOSE 175* 398* 160* 233*  BUN 45* 64* 64* 65*  CREATININE 1.43* 2.06* 1.53* 1.66*  CALCIUM 9.2 9.6 9.4 9.6  MG 2.2  --   --   --     CBC: CBC Latest Ref Rng & Units 12/13/2018 12/06/2018 12/02/2018  WBC 4.0 - 10.5 K/uL 6.9 10.1 7.1  Hemoglobin 12.0 - 15.0 g/dL 9.6(L) 8.8(L) 8.3(L)  Hematocrit 36.0 - 46.0 % 32.8(L) 29.5(L) 27.1(L)  Platelets 150 - 400 K/uL 243 280 216    CBG: Recent Labs  Lab 12/13/18 1213 12/13/18 1644 12/13/18 2114 12/14/18 0629 12/14/18 1203  GLUCAP 253* 94 156* 125* 243*    Brief HPI:   REOLA BUCKLES is a 73 year old female with history of CAD, CKD, COPD with chronic hypoxic hypercapnic respiratory failure, G8QP, chronic systolic CHF with pulmonary hypertension, gait disorder who sustained a fall with subsequent bimalleolar right ankle fracture.  She underwent ORIF right ankle 11/26/2018 by Dr. Sharol Given with recommendations for nonweightbearing x3 weeks.  Her postop course was significant for issues with hypotension and somnolence requiring pressors.  She was treated with IV Lasix as well as BiPAP and midodrine resumed.   She was received 2 units packed red blood cells for acute blood loss anemia.  Opsumit and Selexipeg were resumed, she was weaned off BiPAP however continued to require high flow oxygen.  Pulmonary recommended keeping SPO2 at 90 to 92%.  Therapy was initiated however patient continued to be limited by weakness, fatigue as well as difficulty with nonweightbearing status.   CIR was recommended due to functional deficits in ADLs and mobility    Hospital Course: ENDORA TERESI was admitted to rehab 12/01/2018 for inpatient therapies to consist of PT and OT at least three hours five days a week. Past admission physiatrist, therapy team and rehab RN have worked together to provide customized collaborative inpatient rehab.  Wound VAC was removed on 5/22.  Wound has been monitored closely and has been healing well.  She did report left foot pain with initiation of therapy.  X-rays of left great toe was negative for acute fracture and some peri-screw lucency felt to be due to bone loss.  Right foot incision is C/D/I and is healing well without signs or symptoms of infection.  Sutures were removed without difficulty on 5/27.  She continued to have difficulty maintaining nonweightbearing status on right lower extremity and was cleared to start weightbearing as tolerated by Dr. Sharol Given on 5/29.  She is to follow-up with Dr. Sharol Given past discharge for postop x-rays. Her diabetes has been monitored with ac/hs CBG checks and lantus was titrated to 30 units for tighter control.  Blood sugars continue to be variable and she is to resume Trulicity after discharge. She continued to require high flow oxygen of 4-6 liters during her stay. High flow oxygen was discontinued 5/27 per patient request but she continued to have hypoxemia with activity.    Pulmonary was consulted for input and recommended aggressive pulmonary hygiene, titration of oxygen if O2 < 88% for >  5 minutes as well as gentle diuresis with lasix X 1.   They felt that patient's symptoms cardiogenic in nature therefore cardiology was consulted for input. V/Q scan done for work up and was negative for PE. Dr. Mahalia Longest felt that hypoxia exacerbated due to fluid overload with weight up to 194 lbs She was treated with IV diuresis X 2 days with rise in SCr to 2.06.  Lasix was held X 24 hours with labs showing SCr down to 1.53. Her  respiratory status has improved and weight at discharge is down to 185 lbs.   Serial CBC show showed that acute blood loss anemia is resolving with hemoglobin up to 9.6.  She has been instructed to monitor weights daily and follow-up with cardiology after discharge.  She has been making progress however continues to be limited by endurance issues and hypoxia therefore supervision is recommended after discharge. She declined SNF at discharge.  She will continue to receive further follow-up home health PT, OT, RN and aide by advanced home care after discharge.   Rehab course: During patient's stay in rehab weekly team conferences were held to monitor patient's progress, set goals and discuss barriers to discharge. At admission, patient required mod assist with mobility and max assist with basis self care tasks. She  has had improvement in activity tolerance, balance, postural control as well as ability to compensate for deficits.  She is able to complete ADL tasks with She is able to perform transfers safely with cues and supervision. Her mobility has improved with advancement of WB status and she is able to ambulate 9' with RW and supervision. Family was unavailable for education.   Disposition: Home  Diet: Heart healthy/carb modified medium  Special Instructions: 1.  Monitor weights daily 2.  Keep incision right foot clean and dry.  Wear cam boot right foot at all times. 3.  Monitor blood sugars AC at bedtime. 4.  Increase oxygen to 5 L with activity.   Discharge Instructions    Ambulatory referral to Physical Medicine Rehab   Complete by:  As directed    1-2 weeks transitional care appt     Allergies as of 12/14/2018      Reactions   Amoxicillin Anaphylaxis, Hives, Rash, Other (See Comments)   Has patient had a PCN reaction causing immediate rash, facial/tongue/throat swelling, SOB or lightheadedness with hypotension: YES Positive reaction causing SEVERE RASH INVOLVING MUCUS  MEMBRANES/SKIN NECROSIS: YES Reaction that required HOSPITALIZATION: YES Reaction occurring within the last 10 years: NO      Medication List    STOP taking these medications   Fluticasone-Salmeterol 250-50 MCG/DOSE Aepb Commonly known as:  ADVAIR Replaced by:  mometasone-formoterol 200-5 MCG/ACT Aero   furosemide 40 MG tablet Commonly known as:  LASIX   insulin lispro 100 UNIT/ML injection Commonly known as:  HUMALOG   potassium chloride SA 20 MEQ tablet Commonly known as:  K-DUR   traMADol 50 MG tablet Commonly known as:  ULTRAM   venlafaxine XR 75 MG 24 hr capsule Commonly known as:  EFFEXOR-XR     TAKE these medications   acetaminophen 325 MG tablet Commonly known as:  TYLENOL Take 650 mg by mouth every 6 (six) hours as needed for moderate pain or fever.   atorvastatin 40 MG tablet Commonly known as:  LIPITOR Take 1 tablet (40 mg total) by mouth daily.   citalopram 10 MG tablet Commonly known as:  CELEXA Take 1 tablet (10 mg total) by mouth daily. Notes to  patient:  This takes place of Effexor (discontinued due to increased PR interval)    diclofenac sodium 1 % Gel Commonly known as:  VOLTAREN Apply 2 g topically 4 (four) times daily.   docusate sodium 100 MG capsule Commonly known as:  COLACE Take 1 capsule (100 mg total) by mouth 2 (two) times daily.   gabapentin 300 MG capsule Commonly known as:  NEURONTIN Take 1 capsule (300 mg total) by mouth at bedtime.   Insulin Detemir 100 UNIT/ML Pen Commonly known as:  Levemir FlexTouch Inject 30 Units into the skin at bedtime. What changed:  how much to take   ipratropium-albuterol 0.5-2.5 (3) MG/3ML Soln Commonly known as:  DUONEB Take 3 mLs by nebulization every 4 (four) hours as needed (wheezing, Shortness of breath).   iron polysaccharides 150 MG capsule Commonly known as:  NIFEREX Take 1 capsule (150 mg total) by mouth 2 (two) times daily.   macitentan 10 MG tablet Commonly known as:   Opsumit Take 1 tablet (10 mg total) by mouth daily.   midodrine 5 MG tablet Commonly known as:  PROAMATINE Take 1 tablet (5 mg total) by mouth 3 (three) times daily with meals.   mometasone-formoterol 200-5 MCG/ACT Aero Commonly known as:  DULERA Inhale 2 puffs into the lungs 2 (two) times daily. Replaces:  Fluticasone-Salmeterol 250-50 MCG/DOSE Aepb   OXYGEN Inhale 3 L into the lungs continuous. FOR COPD   polyethylene glycol 17 g packet Commonly known as:  MIRALAX / GLYCOLAX Take 17 g by mouth daily as needed for mild constipation.   Selexipag 200 MCG Tabs Commonly known as:  Uptravi Take 2 tablets (400 mcg total) by mouth 2 (two) times a day.   sodium chloride 0.65 % Soln nasal spray Commonly known as:  OCEAN Place 1 spray into both nostrils as needed for congestion.   Trulicity 1.5 MG/8.6PY Sopn Generic drug:  Dulaglutide Inject 1.5 mg into the vein once a week. Notes to patient:  Resume today      Follow-up Information    Martyn Ehrich, NP Follow up on 12/22/2018.   Specialty:  Pulmonary Disease Why:  Appointment @ 2:00 pm Contact information: [address] [place] [address] [phone]        Maurice Small, MD Follow up on 12/24/2018.   Specialty:  Family Medicine Why:  Appointment @ 11:00 Am Contact information: [place] [address] [phone]        Newt Minion, MD Follow up on 12/14/2018.   Specialty:  Orthopedic Surgery Contact information: [place] [address] [phone]        Charlett Blake, MD Follow up.   Specialty:  Physical Medicine and Rehabilitation Why:  office will call with follow up appointment Contact information: [place] [address] [phone]        [facility] Follow up on 12/23/2018.   Specialty:  Cardiology Why:  [place] [phone] . at 08:30  Contact  information: [address] [id number] [place]          Signed: Bary Leriche 12/15/2018, 10:13 PM

## 2018-12-14 NOTE — Discharge Instructions (Signed)
Inpatient Rehab Discharge Instructions  Amy Shepard Discharge date and time: 12/14/18   Activities/Precautions/ Functional Status: Activity: no lifting, driving, or strenuous exercise till cleared by MD Diet: cardiac diet--Limit fluids to 1800 cc/day (about 4 cups per day).  Wound Care: Keep wound clean and dry. Wear CAM boot at all times. Contact Dr. Sharol Given if you develop any problems with your incision/wound--redness, swelling, increase in pain, drainage or if you develop fever or chills.    Functional status:  ___ No restrictions     ___ Walk up steps independently _X__ 24/7 supervision/assistance   ___ Walk up steps with assistance ___ Intermittent supervision/assistance  ___ Bathe/dress independently ___ Walk with walker     ___ Bathe/dress with assistance ___ Walk Independently    ___ Shower independently _X__ Walk with supervision    _X__ Shower with assistance ___ No alcohol     ___ Return to work/school ________   Special Instructions: 1. Weigh yourself daily. 2.  Increase oxygen to 5 liters with activity.  3. Wear CAM boot at all times--may loose when in bed.    COMMUNITY REFERRALS UPON DISCHARGE:    Home Health:   PT,OT, RN, [place]   Date of last service:12/14/2018  Medical Equipment/Items Ordered:HOSPITAL BED, WHEELCHAIR AND 3 IN 1  Agency/Supplier:[facility]   [phone]    My questions have been answered and I understand these instructions. I will adhere to these goals and the provided educational materials after my discharge from the hospital.  Patient/Caregiver Signature _______________________________ Date __________  Clinician Signature _______________________________________ Date __________  Please bring this form and your medication list with you to all your follow-up doctor's appointments.

## 2018-12-14 NOTE — Progress Notes (Signed)
Patient given discharge instructions by Algis Liming, PA. Patient and all her personal belonging left via EMS to patients home.  Nicholes Rough, RN

## 2018-12-14 NOTE — Progress Notes (Signed)
[facility] PHYSICAL MEDICINE & REHABILITATION PROGRESS NOTE   Subjective/Complaints:  Appreciate cardiology note, elevated creat Pt ready for d/c from therapy standpoint, mobility improved since WBAT RLE  ROS: Denies CP, SOB, N/V/D  Objective:   No results found. Recent Labs    12/13/18 0837  WBC 6.9  HGB 9.6*  HCT 32.8*  PLT 243   Recent Labs    12/13/18 0837  NA 137  K 5.4*  CL 98  CO2 28  GLUCOSE 398*  BUN 64*  CREATININE 2.06*  CALCIUM 9.6    Intake/Output Summary (Last 24 hours) at 12/14/2018 0705 Last data filed at 12/14/2018 0630 Gross per 24 hour  Intake 350 ml  Output 1575 ml  Net -1225 ml     Physical Exam: Vital Signs Blood pressure (!) 108/47, pulse 74, temperature (!) 97.5 F (36.4 C), temperature source Oral, resp. rate (!) 24, height _0  (1.575 m), weight 84 kg, SpO2 (!) 76 %. Constitutional: No distress . Vital signs reviewed. HENT: Normocephalic.  Atraumatic. Eyes: EOMI.  No discharge. Cardiovascular: No JVD. Respiratory: Normal effort.  + [place]. GI: Non-distended. Musc: Cam boot right lower extremity. Lower extremity edema Neurologic: Alert and oriented Motor: 4+-5/5 bilateral upper extremities and left lower extremity, stable. Right lower extremity hip flexion, knee extension 4+/5, wiggles toes Sensation intact light touch  Assessment/Plan: 1. Functional deficits secondary to Right trimalleolar fracture which require 3+ hours per day of interdisciplinary therapy in a comprehensive inpatient rehab setting.  Physiatrist is providing close team supervision and 24 hour management of active medical problems listed below.  Physiatrist and rehab team continue to assess barriers to discharge/monitor patient progress toward functional and medical goals  Care Tool:  Bathing  Bathing activity did not occur: (seated on BSC) Body parts bathed by patient: Right arm, Left arm, Chest, Abdomen, Front perineal area, Face, Buttocks, Right upper leg,  Left upper leg, Left lower leg   Body parts bathed by helper: Buttocks, Right lower leg, Left lower leg Body parts n/a: Right lower leg   Bathing assist Assist Level: Supervision/Verbal cueing(simulated)     Upper Body Dressing/Undressing Upper body dressing   What is the patient wearing?: Pull over shirt    Upper body assist Assist Level: Set up assist    Lower Body Dressing/Undressing Lower body dressing      What is the patient wearing?: Pants(simulated using theraband)     Lower body assist Assist for lower body dressing: Supervision/Verbal cueing     Toileting Toileting Toileting Activity did not occur (Clothing management and hygiene only): Refused  Toileting assist Assist for toileting: Supervision/Verbal cueing(simulated)     Transfers Chair/bed transfer  Transfers assist     Chair/bed transfer assist level: Supervision/Verbal cueing     Locomotion Ambulation   Ambulation assist   Ambulation activity did not occur: Safety/medical concerns  Assist level: Supervision/Verbal cueing Assistive device: Walker-rolling Max distance: 87f   Walk 10 feet activity   Assist  Walk 10 feet activity did not occur: Safety/medical concerns  Assist level: Supervision/Verbal cueing Assistive device: Walker-rolling   Walk 50 feet activity   Assist Walk 50 feet with 2 turns activity did not occur: Safety/medical concerns         Walk 150 feet activity   Assist Walk 150 feet activity did not occur: Safety/medical concerns         Walk 10 feet on uneven surface  activity   Assist Walk 10 feet on uneven surfaces activity did not  occur: Safety/medical concerns   Assist level: Contact Guard/Touching assist     Wheelchair     Assist Will patient use wheelchair at discharge?: Yes Type of Wheelchair: Manual    Wheelchair assist level: Supervision/Verbal cueing Max wheelchair distance: 150(1 short rest break)    Wheelchair 50 feet with 2  turns activity    Assist        Assist Level: Supervision/Verbal cueing   Wheelchair 150 feet activity     Assist     Assist Level: Set up assist    Medical Problem List and Plan: 1.  Functional deficits secondary to right trimalleolar ankle fracture and acute on chronic respiratory failure.    Hold D/C acute renal insuff on CKD, hypoxic this am    Patient noncompliant with WB restriction, advanced to weightbearing as tolerated with cam boot per Ortho 2.  Antithrombotics: -DVT/anticoagulation:  Pharmaceutical: Lovenox             -antiplatelet therapy: N/A. 3. Pain Management:  Controlled with prn tylenol and robaxin.             Monitor with increased mobility   Paresthesias the distribution is atypical for neuropathy gabapentin resumed at 300 3 times daily 4. Mood: LCSW to follow for evaluation and support.              -antipsychotic agents: N/A Hx depression d/ced Effexor, restared Celexa 5. Neuropsych: This patient is capable of making decisions on her own behalf. 6. Skin/Wound Care: Routine pressure relief measures. Skin area is not psoriasis, possible seborrhea, will f/u with derm as OP 7. Fluids/Electrolytes/Nutrition: Monitor I/O.     8. H/o Lung cancer/COPD- O2 dependent:   Continue Dulera bid with duonebs prn.   Resp virus testing (including SARS Covid 2 neg)  IS and Flutter valve  Per Cards - hypoxia, recommend work-up for PE.  VQ scan ordered, negative for PE. 9.  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  Continue Opsumit and Selexipag. On Lipitor.   Cards consulted for shortness of breath and believed it was secondary to fluid overload.  Diuretics, labs ordered.  Appreciate recs   10. T2DM;  Continue Levemir and titrate as needed. Off Trulicity at this time.  Meal coverage as at home and use SSI for elevated BS. Continue to monitor BS ac/hs.  Monitor with increased mobility. CBG (last 3)  Recent Labs    12/13/18 1644  12/13/18 2114 12/14/18 0629  GLUCAP 94 156* 125*   -increased to 28U on 5/29 will increase to 30U 6/1, controlled    11. CKD:  Continue to monitor renal status with serial checks.   Creatinine 1.43 on 5/30, now 2.1 Creat elevated likely due to aggressive diuresis, hold off on d/c and ask cards to re eval  Continue to monitor 12. Chronic hypotension: Monitor BP tid. On midodrine  Vitals:   12/13/18 2113 12/14/18 0448  BP:  (!) 108/47  Pulse: 68 74  Resp: 14 (!) 24  Temp:  (!) 97.5 F (36.4 C)  SpO2:  (!) 76%  low with increased resp rate,  13. Acute on chronic iron deficiency anemia: Baseline Hgb in 9 range. Has improved past transfusion. Added iron supplement. Will continue to monitor.  Hemoglobin 8.8 on 5/25 14. Right trimalleolar ankle fracture:   CAM at all times, may loosen while in bed periodically for comfort 15.  Hyper K despite aggressive diuresis, ?iunadequate renal perfusion . Low dose kayexalate if repeat BMET still  elevated LOS: 13 days A FACE TO FACE EVALUATION WAS PERFORMED  Charlett Blake 12/14/2018, 7:05 AM

## 2018-12-15 ENCOUNTER — Telehealth: Payer: Self-pay | Admitting: Orthopedic Surgery

## 2018-12-15 DIAGNOSIS — Z79899 Other long term (current) drug therapy: Secondary | ICD-10-CM

## 2018-12-15 DIAGNOSIS — I2510 Atherosclerotic heart disease of native coronary artery without angina pectoris: Secondary | ICD-10-CM

## 2018-12-15 DIAGNOSIS — I5042 Chronic combined systolic (congestive) and diastolic (congestive) heart failure: Secondary | ICD-10-CM

## 2018-12-15 DIAGNOSIS — I130 Hypertensive heart and chronic kidney disease with heart failure and stage 1 through stage 4 chronic kidney disease, or unspecified chronic kidney disease: Secondary | ICD-10-CM

## 2018-12-15 DIAGNOSIS — E7800 Pure hypercholesterolemia, unspecified: Secondary | ICD-10-CM

## 2018-12-15 DIAGNOSIS — M1990 Unspecified osteoarthritis, unspecified site: Secondary | ICD-10-CM

## 2018-12-15 DIAGNOSIS — J9611 Chronic respiratory failure with hypoxia: Secondary | ICD-10-CM

## 2018-12-15 DIAGNOSIS — Z794 Long term (current) use of insulin: Secondary | ICD-10-CM

## 2018-12-15 DIAGNOSIS — Z87891 Personal history of nicotine dependence: Secondary | ICD-10-CM

## 2018-12-15 DIAGNOSIS — E1122 Type 2 diabetes mellitus with diabetic chronic kidney disease: Secondary | ICD-10-CM

## 2018-12-15 DIAGNOSIS — C3431 Malignant neoplasm of lower lobe, right bronchus or lung: Secondary | ICD-10-CM | POA: Diagnosis not present

## 2018-12-15 DIAGNOSIS — N189 Chronic kidney disease, unspecified: Secondary | ICD-10-CM

## 2018-12-15 DIAGNOSIS — Z9981 Dependence on supplemental oxygen: Secondary | ICD-10-CM

## 2018-12-15 DIAGNOSIS — J9612 Chronic respiratory failure with hypercapnia: Secondary | ICD-10-CM

## 2018-12-15 DIAGNOSIS — I272 Pulmonary hypertension, unspecified: Secondary | ICD-10-CM

## 2018-12-15 DIAGNOSIS — S82851D Displaced trimalleolar fracture of right lower leg, subsequent encounter for closed fracture with routine healing: Secondary | ICD-10-CM | POA: Diagnosis not present

## 2018-12-15 DIAGNOSIS — J449 Chronic obstructive pulmonary disease, unspecified: Secondary | ICD-10-CM | POA: Diagnosis not present

## 2018-12-15 DIAGNOSIS — Z9181 History of falling: Secondary | ICD-10-CM

## 2018-12-15 DIAGNOSIS — F329 Major depressive disorder, single episode, unspecified: Secondary | ICD-10-CM

## 2018-12-15 NOTE — Telephone Encounter (Signed)
RLE Weight Bearing: Weight bearing as tolerated was reported to [employer]

## 2018-12-15 NOTE — Telephone Encounter (Signed)
Patient was called and appointment was already made. FYI

## 2018-12-15 NOTE — Telephone Encounter (Signed)
Please call with clarification of patients weightbearing status.

## 2018-12-16 ENCOUNTER — Telehealth: Payer: Self-pay | Admitting: *Deleted

## 2018-12-16 ENCOUNTER — Telehealth: Payer: Self-pay | Admitting: Registered Nurse

## 2018-12-16 NOTE — Telephone Encounter (Signed)
[facility] called to request POC 2wk3.  Approval given.

## 2018-12-16 NOTE — Telephone Encounter (Signed)
Cheryl RN called from [facility] to report that she changed the dressing to her ankle which had previously had yellowish drainage that is now no longer draining. Dry dressing reapplied. Notified Malachy Mood that Dr Letta Pate is the provider responsible for [facility] orders.

## 2018-12-16 NOTE — Telephone Encounter (Signed)
Transitional Care call  Patient name: Amy Shepard. Spinello DOB: 27-Jan-1946 1. Are you/is patient experiencing any problems since coming home? No a. Are there any questions regarding any aspect of care? No 2. Are there any questions regarding medications administration/dosing? No a. Are meds being taken as prescribed? Yes b. "Patient should review meds with caller to confirm" Medication List Reviewed. 3. Have there been any falls? No 4. Has Home Health been to the house and/or have they contacted you? Yes, [facility]. a. If not, have you tried to contact them? NA b. Can we help you contact them? NA 5. Are bowels and bladder emptying properly? Yes a. Are there any unexpected incontinence issues? No b. If applicable, is patient following bowel/bladder programs? NA 6. Any fevers, problems with breathing, unexpected pain? No 7. Are there any skin problems or new areas of breakdown? No 8. Has the patient/family member arranged specialty MD follow up (ie cardiology/neurology/renal/surgical/etc.)?  HF[facility] appointments are scheduled.  a. Can we help arrange? NA 9. Does the patient need any other services or support that we can help arrange? She doesn't have a way to pick up her wheelchair from [place]. This provider will send Ovidio Kin e-mail regarding the above, she verbalizes understanding.  10. Are caregivers following through as expected in assisting the patient? Self-Care 11. Has the patient quit smoking, drinking alcohol, or using drugs as recommended? [name] states she doesn't smoke, drink alcohol or use illicit drugs.   Appointment date/time 12/27/2018  arrival time 2:00 for 2:30 appointment with Dr. Letta Pate. At [place]

## 2018-12-20 ENCOUNTER — Ambulatory Visit (INDEPENDENT_AMBULATORY_CARE_PROVIDER_SITE_OTHER): Payer: Medicare Other | Admitting: Physician Assistant

## 2018-12-20 ENCOUNTER — Ambulatory Visit (INDEPENDENT_AMBULATORY_CARE_PROVIDER_SITE_OTHER): Payer: Medicare Other

## 2018-12-20 ENCOUNTER — Other Ambulatory Visit: Payer: Self-pay

## 2018-12-20 ENCOUNTER — Encounter: Payer: Self-pay | Admitting: Orthopedic Surgery

## 2018-12-20 VITALS — Ht 62.0 in | Wt 185.2 lb

## 2018-12-20 DIAGNOSIS — N184 Chronic kidney disease, stage 4 (severe): Secondary | ICD-10-CM

## 2018-12-20 DIAGNOSIS — S82851S Displaced trimalleolar fracture of right lower leg, sequela: Secondary | ICD-10-CM

## 2018-12-20 DIAGNOSIS — J9611 Chronic respiratory failure with hypoxia: Secondary | ICD-10-CM

## 2018-12-20 DIAGNOSIS — S82891D Other fracture of right lower leg, subsequent encounter for closed fracture with routine healing: Secondary | ICD-10-CM

## 2018-12-20 MED ORDER — DOXYCYCLINE HYCLATE 100 MG PO CAPS: 100.0000 mg | ORAL_CAPSULE | Freq: Two times a day (BID) | ORAL | 1 refills | Status: DC

## 2018-12-20 NOTE — Progress Notes (Signed)
Office Visit Note   Patient: Amy Shepard           Date of Birth: Jan 05, 1946           MRN: [id number] Visit Date: 12/20/2018              Requested by: Maurice Small, MD [address], [place] [address] PCP: Maurice Small, MD  Chief Complaint  Patient presents with  . Right Ankle - Routine Post Op    11/23/18 ORIF right ankle Fx      HPI: The patient is a 73 yo woman who is seen for post operative follow up following open reduction internal fixation of a right closed trimalleolar fracture on 11/23/2018. She went to inpatient rehab following her acute hospitalization which was complicated by acute on chronic respiratory failure. She was unable to consistently maintain non weight bearing. She comes in today and has been walking in a fracture boot with a rolling walker. She reports little to no pain over the ankle with weight bearing and walking with her walker.   Assessment & Plan: Visit Diagnoses:  1. Trimalleolar fracture of ankle, closed, right, sequela   2. Closed fracture of right ankle with routine healing, subsequent encounter   3. CKD (chronic kidney disease), stage IV ([place])   4. Chronic respiratory failure with hypoxia [facility])     Plan: Dr. Sharol Given discussed the follow up xrays show migration of hardware and fracture alignment. Doubt infection, but will cover with Doxycycline 100 mg BID . Dr. Sharol Given advised she may require further surgery, but will try conservative treatment with non weight bearing and continue fracture boot at all times. Advised to patient to return as soon as possible for progression of deformity or concerns prior to next week.  Will have her follow up in 1 week with follow up radiographs of the ankle.   Follow-Up Instructions: Return in about 1 week (around 12/27/2018).   Ortho Exam  Patient is alert, oriented, no adenopathy, well-dressed, normal affect, normal respiratory effort. Right ankle with localized edema over the medial>>  lateral ankle. The lateral incision is well healed and without signs of infection. The medial incision has some minimal bloody drainage and mild erythema and induration. Palpable pedal pulses. Ankle unstable on exam.   Imaging: Xr Ankle Complete Right  Result Date: 12/20/2018 3 view radiographs of the right ankle shows migration of the fixation and displacement and migration of fracture fragments.    Labs: Lab Results  Component Value Date   HGBA1C 7.2 (H) 04/11/2018   HGBA1C 14.4 (H) 05/02/2017   HGBA1C 13.6 (H) 03/11/2017   ESRSEDRATE 83 (H) 04/13/2018   ESRSEDRATE 37 (H) 08/13/2016   CRP 0.3 (L) 09/11/2016   REPTSTATUS 03/14/2018 FINAL 03/11/2018   CULT >=100,000 COLONIES/mL ESCHERICHIA COLI (A) 03/11/2018   LABORGA ESCHERICHIA COLI (A) 03/11/2018     Lab Results  Component Value Date   ALBUMIN 2.8 (L) 12/02/2018   ALBUMIN 3.1 (L) 11/25/2018   ALBUMIN 3.3 (L) 08/18/2018    Body mass index is 33.87 kg/m.  Orders:  Orders Placed This Encounter  Procedures  . XR Ankle Complete Right   Meds ordered this encounter  Medications  . doxycycline (VIBRAMYCIN) 100 MG capsule    Sig: Take 1 capsule (100 mg total) by mouth 2 (two) times daily.    Dispense:  28 capsule    Refill:  1     Procedures: No procedures performed  Clinical Data: No  additional findings.  ROS:  All other systems negative, except as noted in the HPI. Review of Systems  Objective: Vital Signs: Ht _0  (1.575 m)   Wt 185 lb 3 oz (84 kg)   BMI 33.87 kg/m   Specialty Comments:  No specialty comments available.  PMFS History: Patient Active Problem List   Diagnosis Date Noted  . SOB (shortness of breath)   . Acute on chronic systolic (congestive) heart failure ([place])   . 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 Diabetes mellitus type 2 in obese ([place])   . Supplemental oxygen dependent   . Trimalleolar fracture of ankle,  closed, right, sequela 12/01/2018  . Hypoxia   . Acute on chronic anemia   . Closed dislocation of right talus   . Acute on chronic postoperative respiratory failure ([place])   . Postprocedural hypotension   . Trimalleolar fracture of ankle, closed, right, initial encounter   . Syndesmotic disruption of ankle, right, initial encounter   . Ankle fracture 11/22/2018  . Pain in joint, ankle and foot 04/10/2018  . Closed left fibular fracture 04/10/2018  . CKD (chronic kidney disease), stage IV ([place]) 04/09/2018  . Near syncope 04/09/2018  . Dyspnea   . Bronchitis, acute 03/11/2018  . Fatigue 03/11/2018  . Anemia due to stage 4 chronic kidney disease ([place]) 03/11/2018  . Pulmonary fibrosis ([place]) 03/11/2018  . Pulmonary HTN ([place]) 03/11/2018  . Anemia 03/11/2018  . Lisfranc dislocation, left, subsequent encounter 06/08/2017  . Chronic respiratory failure with hypoxia ([place]) 06/03/2017  . Falls 06/03/2017  . Hypokalemia 06/03/2017  . Sepsis secondary to UTI ([place]) 06/02/2017  . Dog bite 05/02/2017  . CHF exacerbation ([place]) 05/02/2017  . Acute diastolic CHF (congestive heart failure) ([place]) 05/02/2017  . Chronic diastolic CHF (congestive heart failure) ([place]) 04/02/2017  . Cor pulmonale ([place]) 04/02/2017  . Acute on chronic respiratory failure ([place]) 03/10/2017  . Chronic pain 03/10/2017  . Lactic acidosis 02/10/2017  . Acute respiratory failure with hypoxia ([place]) 02/09/2017  . Acute on chronic diastolic CHF (congestive heart failure) ([place]) 06/23/2016  . HTN (hypertension) 06/23/2016  . Type 2 diabetes mellitus with complication, with long-term current use of insulin ([place]) 03/11/2016  . Depression 03/11/2016  . Chronic obstructive pulmonary disease ([place]) 03/10/2016  . S/P lumbar spinal fusion 02/22/2016  . Coronary artery disease involving native coronary artery 07/12/2014  . Pain in the chest   . Malignant neoplasm of lower lobe of right lung ([place]) 05/25/2014  . Lung cancer ([place]) 09/01/2012    Past Medical History:  Diagnosis Date  . Arthritis   . CAD (coronary artery disease)    a. Prior cath 2015 showed 40% prox AD, 50-50% mLAD, otherwise calcification but no obstruction in LCx/RCA.. Medical management recommended. b. 2017: low-risk NST.  [name] Chronic diastolic CHF (congestive heart failure) ([place])   . Chronic respiratory failure ([place])   . CKD (chronic kidney disease), stage III ([place])   . COPD (chronic obstructive pulmonary disease) ([place])   . Cor pulmonale ([name])    a. felt due to advanced COPD and noncompliance with O2.  . Depression   . Diabetes mellitus    [name]    dx  2008  . Fracture    left foot  . Hx of seasonal allergies   . Hypercholesteremia   . Hypertension    on no medications  . Lung cancer ([place])   . On home  oxygen therapy    "2.5L; 24/7" (03/10/2017)  . Pericardial effusion    a. small-moderate in 07/2016.  . Pulmonary hypertension ([place])   . UTI (urinary tract infection)     Family History  Problem Relation Age of Onset  . Diabetes Father   . Heart failure Father   . Cancer Sister   . Diabetes Brother   . Heart failure Brother     Past Surgical History:  Procedure Laterality Date  . APPLICATION OF WOUND VAC Right 11/23/2018   Procedure: Application Of Wound Vac;  Surgeon: Newt Minion, MD;  Location: [facility];  Service: Orthopedics;  Laterality: Right;  . [facility] or [facility] in [place]     bilateral cataracts  . LEFT HEART CATHETERIZATION WITH CORONARY ANGIOGRAM N/A 07/12/2014   Procedure: LEFT HEART CATHETERIZATION WITH CORONARY ANGIOGRAM;  Surgeon: [name], MD;  Location: [facility] CATH LAB;  Service: Cardiovascular;  Laterality: N/A;  . MAXIMUM ACCESS (MAS)POSTERIOR LUMBAR INTERBODY FUSION (PLIF) 2 LEVEL N/A 02/22/2016   Procedure: Lumbar three-four - Lumbar four-five  MAXIMUM ACCESS SURGERY  POSTERIOR LUMBAR INTERBODY FUSION;  Surgeon: [name], MD;  Location: [facility] NEURO ORS;  Service:  Neurosurgery;  Laterality: N/A;  . ORIF ANKLE FRACTURE Right 11/23/2018   Procedure: OPEN REDUCTION INTERNAL FIXATION (ORIF) RIGHT ANKLE FRACTURE;  Surgeon: Newt Minion, MD;  Location: [facility];  Service: Orthopedics;  Laterality: Right;  . ORIF TOE FRACTURE Left 06/12/2017   Procedure: OPEN REDUCTION INTERNAL FIXATION (ORIF) BASE 1ST METATARSAL (TOE) FRACTURE;  Surgeon: Newt Minion, MD;  Location: [facility];  Service: Orthopedics;  Laterality: Left;  . RIGHT HEART CATH N/A 08/06/2017   Procedure: RIGHT HEART CATH;  Surgeon: Larey Dresser, MD;  Location: [facility] CV LAB;  Service: Cardiovascular;  Laterality: N/A;  . THORACOTOMY Right 2010   lower  . TONSILLECTOMY     Social History   Occupational History  . Not on file  Tobacco Use  . Smoking status: Former Smoker    Packs/day: 1.00    Years: 30.00    Pack years: 30.00    Last attempt to quit: 07/14/2006    Years since quitting: 12.4  . Smokeless tobacco: Never Used  Substance and Sexual Activity  . Alcohol use: No  . Drug use: No  . Sexual activity: Not on file

## 2018-12-22 ENCOUNTER — Inpatient Hospital Stay: Payer: Medicare Other | Admitting: Primary Care

## 2018-12-23 ENCOUNTER — Encounter (HOSPITAL_COMMUNITY): Payer: Medicare Other

## 2018-12-23 ENCOUNTER — Telehealth: Payer: Self-pay | Admitting: Physical Medicine & Rehabilitation

## 2018-12-23 NOTE — Telephone Encounter (Signed)
Amy Shepard from [facility] ) OT called and request 1 x week verbal orders for 2 weeks - patient may go to skilled facility - also cancelled appt for TC ---[phone]  Her return phone (can leave confidential voicemail)

## 2018-12-23 NOTE — Telephone Encounter (Signed)
Notified. 

## 2018-12-23 NOTE — Telephone Encounter (Signed)
OK for another 1-2 wk of HHOT

## 2018-12-27 ENCOUNTER — Encounter: Payer: Medicare Other | Admitting: Physical Medicine & Rehabilitation

## 2018-12-27 ENCOUNTER — Ambulatory Visit (INDEPENDENT_AMBULATORY_CARE_PROVIDER_SITE_OTHER): Payer: Medicare Other | Admitting: Physician Assistant

## 2018-12-27 ENCOUNTER — Ambulatory Visit (INDEPENDENT_AMBULATORY_CARE_PROVIDER_SITE_OTHER): Payer: Medicare Other

## 2018-12-27 ENCOUNTER — Other Ambulatory Visit: Payer: Self-pay

## 2018-12-27 ENCOUNTER — Encounter: Payer: Self-pay | Admitting: Orthopedic Surgery

## 2018-12-27 VITALS — Ht 62.0 in | Wt 185.2 lb

## 2018-12-27 DIAGNOSIS — S82891P Other fracture of right lower leg, subsequent encounter for closed fracture with malunion: Secondary | ICD-10-CM

## 2018-12-27 DIAGNOSIS — J9611 Chronic respiratory failure with hypoxia: Secondary | ICD-10-CM

## 2018-12-27 DIAGNOSIS — Z794 Long term (current) use of insulin: Secondary | ICD-10-CM

## 2018-12-27 DIAGNOSIS — E118 Type 2 diabetes mellitus with unspecified complications: Secondary | ICD-10-CM

## 2018-12-27 DIAGNOSIS — E440 Moderate protein-calorie malnutrition: Secondary | ICD-10-CM

## 2018-12-27 NOTE — Progress Notes (Addendum)
Office Visit Note   Patient: Amy Shepard           Date of Birth: [birth year]           MRN: [id number] Visit Date: 12/27/2018              Requested by: Maurice Small, MD [place],  [place] [address] PCP: Maurice Small, MD  Chief Complaint  Patient presents with  . Right Ankle - Routine Post Op    11/23/18 right ankle ORIF Fx      HPI: The patient is a 73 year old woman who is seen for postoperative follow-up following open reduction internal fixation of a right closed trimalleolar fracture on 11/23/2018.  She has been unable to maintain her nonweightbearing status and developed migration of her hardware and loss of the fracture alignment.  She also had some mild drainage from her medial incision.  She has been on doxycycline for the past week and has been maintained strict nonweightbearing in her fracture boot for the past week.  She reports no pain over the area.  She continues to wear her fracture boot.  Assessment & Plan: Visit Diagnoses:  1. Closed fracture of right ankle with malunion, subsequent encounter   2. Chronic respiratory failure with hypoxia ([name])   3. Type 2 diabetes mellitus with complication, with long-term current use of insulin ([place])   4. Moderate protein malnutrition ([place])     Plan: Dr. Sharol Given counseled the patient that the fracture remains out of alignment but no further migration is noted. Dr. Sharol Given discussed that we can try and allow things to heal as they are vs. proceed with further surgery with fusion of the ankle.   The patient wishes to try nonoperative management with nonweightbearing in the fracture boot and Dr. Sharol Given is in agreement with attempting to try this at this point.  Resume compression stocking for edema control.  The patient will continue on her doxycycline 100 mg p.o. twice daily.  We will see the patient back in 2 weeks. She will need complete right ankle radiographs at follow up   Follow-Up Instructions:  Return in about 2 weeks (around 01/10/2019).   Ortho Exam  Patient is alert, oriented, no adenopathy, well-dressed, normal affect, normal respiratory effort. The right ankle is less edematous. The medial incision is now dry and closed. Both incisions are without erythema today. No signs of infection or cellulitis otherwise. Palpable pedal pulses.   Imaging: Xr Ankle Complete Right  Result Date: 12/27/2018 3 view radiographs of the right ankle shows no further migration of the fixation and stable displacement of fracture fragments.  No images are attached to the encounter.  Labs: Lab Results  Component Value Date   HGBA1C 7.2 (H) 04/11/2018   HGBA1C 14.4 (H) 05/02/2017   HGBA1C 13.6 (H) 03/11/2017   ESRSEDRATE 83 (H) 04/13/2018   ESRSEDRATE 37 (H) 08/13/2016   CRP 0.3 (L) 09/11/2016   REPTSTATUS 03/14/2018 FINAL 03/11/2018   CULT >=100,000 COLONIES/mL ESCHERICHIA COLI (A) 03/11/2018   LABORGA ESCHERICHIA COLI (A) 03/11/2018     Lab Results  Component Value Date   ALBUMIN 2.8 (L) 12/02/2018   ALBUMIN 3.1 (L) 11/25/2018   ALBUMIN 3.3 (L) 08/18/2018    Body mass index is 33.87 kg/m.  Orders:  Orders Placed This Encounter  Procedures  . XR Ankle Complete Right   No orders of the defined types were placed in this encounter.    Procedures: No procedures performed  Clinical Data: No additional findings.  ROS:  All other systems negative, except as noted in the HPI. Review of Systems  Objective: Vital Signs: Ht _0  (1.575 m)   Wt 185 lb 3 oz (84 kg)   BMI 33.87 kg/m   Specialty Comments:  No specialty comments available.  PMFS History: Patient Active Problem List   Diagnosis Date Noted  . SOB (shortness of breath)   . Acute on chronic systolic (congestive) heart failure ([place])   . 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 Diabetes mellitus type 2 in obese ([place])   . Supplemental oxygen  dependent   . Trimalleolar fracture of ankle, closed, right, sequela 12/01/2018  . Hypoxia   . Acute on chronic anemia   . Closed dislocation of right talus   . Acute on chronic postoperative respiratory failure ([place])   . Postprocedural hypotension   . Trimalleolar fracture of ankle, closed, right, initial encounter   . Syndesmotic disruption of ankle, right, initial encounter   . Ankle fracture 11/22/2018  . Pain in joint, ankle and foot 04/10/2018  . Closed left fibular fracture 04/10/2018  . CKD (chronic kidney disease), stage IV ([place]) 04/09/2018  . Near syncope 04/09/2018  . Dyspnea   . Bronchitis, acute 03/11/2018  . Fatigue 03/11/2018  . Anemia due to stage 4 chronic kidney disease ([place]) 03/11/2018  . Pulmonary fibrosis ([place]) 03/11/2018  . Pulmonary HTN ([place]) 03/11/2018  . Anemia 03/11/2018  . Lisfranc dislocation, left, subsequent encounter 06/08/2017  . Chronic respiratory failure with hypoxia ([place]) 06/03/2017  . Falls 06/03/2017  . Hypokalemia 06/03/2017  . Sepsis secondary to UTI ([place]) 06/02/2017  . Dog bite 05/02/2017  . CHF exacerbation ([place]) 05/02/2017  . Acute diastolic CHF (congestive heart failure) ([place]) 05/02/2017  . Chronic diastolic CHF (congestive heart failure) ([place]) 04/02/2017  . Cor pulmonale ([place]) 04/02/2017  . Acute on chronic respiratory failure ([place]) 03/10/2017  . Chronic pain 03/10/2017  . Lactic acidosis 02/10/2017  . Acute respiratory failure with hypoxia ([place]) 02/09/2017  . Acute on chronic diastolic CHF (congestive heart failure) ([place]) 06/23/2016  . HTN (hypertension) 06/23/2016  . Type 2 diabetes mellitus with complication, with long-term current use of insulin ([place]) 03/11/2016  . Depression 03/11/2016  . Chronic obstructive pulmonary disease ([place]) 03/10/2016  . S/P lumbar spinal fusion 02/22/2016  . Coronary artery disease involving native coronary artery 07/12/2014  . Pain in the chest   . Malignant neoplasm of lower lobe of right lung  ([place]) 05/25/2014  . Lung cancer ([place]) 09/01/2012   Past Medical History:  Diagnosis Date  . Arthritis   . CAD (coronary artery disease)    a. Prior cath 2015 showed 40% prox AD, 50-50% mLAD, otherwise calcification but no obstruction in LCx/RCA.. Medical management recommended. b. 2017: low-risk NST.  [name] Chronic diastolic CHF (congestive heart failure) ([place])   . Chronic respiratory failure ([place])   . CKD (chronic kidney disease), stage III ([place])   . COPD (chronic obstructive pulmonary disease) ([place])   . Cor pulmonale ([name])    a. felt due to advanced COPD and noncompliance with O2.  . Depression   . Diabetes mellitus    [name]    dx  2008  . Fracture    left foot  . Hx of seasonal allergies   . Hypercholesteremia   . Hypertension    on no medications  . Lung cancer ([place])   .  On home oxygen therapy    "2.5L; 24/7" (03/10/2017)  . Pericardial effusion    a. small-moderate in 07/2016.  . Pulmonary hypertension ([place])   . UTI (urinary tract infection)     Family History  Problem Relation Age of Onset  . Diabetes Father   . Heart failure Father   . Cancer Sister   . Diabetes Brother   . Heart failure Brother     Past Surgical History:  Procedure Laterality Date  . APPLICATION OF WOUND VAC Right 11/23/2018   Procedure: Application Of Wound Vac;  Surgeon: Newt Minion, MD;  Location: [facility];  Service: Orthopedics;  Laterality: Right;  . [facility] [place] or [facility] in [place]     bilateral cataracts  . LEFT HEART CATHETERIZATION WITH CORONARY ANGIOGRAM N/A 07/12/2014   Procedure: LEFT HEART CATHETERIZATION WITH CORONARY ANGIOGRAM;  Surgeon: [name], MD;  Location: [facility] CATH LAB;  Service: Cardiovascular;  Laterality: N/A;  . MAXIMUM ACCESS (MAS)POSTERIOR LUMBAR INTERBODY FUSION (PLIF) 2 LEVEL N/A 02/22/2016   Procedure: Lumbar three-four - Lumbar four-five  MAXIMUM ACCESS SURGERY  POSTERIOR LUMBAR INTERBODY FUSION;  Surgeon:  [name], MD;  Location: [facility];  Service: Neurosurgery;  Laterality: N/A;  . ORIF ANKLE FRACTURE Right 11/23/2018   Procedure: OPEN REDUCTION INTERNAL FIXATION (ORIF) RIGHT ANKLE FRACTURE;  Surgeon: Newt Minion, MD;  Location: [facility];  Service: Orthopedics;  Laterality: Right;  . ORIF TOE FRACTURE Left 06/12/2017   Procedure: OPEN REDUCTION INTERNAL FIXATION (ORIF) BASE 1ST METATARSAL (TOE) FRACTURE;  Surgeon: Newt Minion, MD;  Location: [facility];  Service: Orthopedics;  Laterality: Left;  . RIGHT HEART CATH N/A 08/06/2017   Procedure: RIGHT HEART CATH;  Surgeon: Larey Dresser, MD;  Location: [facility];  Service: Cardiovascular;  Laterality: N/A;  . THORACOTOMY Right 2010   lower  . TONSILLECTOMY     Social History   Occupational History  . Not on file  Tobacco Use  . Smoking status: Former Smoker    Packs/day: 1.00    Years: 30.00    Pack years: 30.00    Quit date: 07/14/2006    Years since quitting: 12.4  . Smokeless tobacco: Never Used  Substance and Sexual Activity  . Alcohol use: No  . Drug use: No  . Sexual activity: Not on file

## 2018-12-27 NOTE — Progress Notes (Signed)
_0  ID: Amy Shepard, female    DOB: 29-Sep-1945, 73 y.o.   MRN: [id number]  Chief Complaint  Patient presents with   Follow-up    swelling    Referring provider: Maurice Small, MD  HPI: 73 year old female, former smoker. PMH significant for COPD, chronic respiratory failure and hypoxia, malignant neoplasm of lower lobe right lung, chronic diastolic CHF, pulmonary hypertension, diabetes mellitus type 2, CKD, depression. Patient of Dr. Vaughan Browner, last seen on 09/11/2016. Recent hospitalization in May for closed ankle fracture.   Hospital course: S/p fall sustained bimalleolar right ankle fracture. Patient underwent ORIF right ankle on 11/26/18 with Dr. Sharol Given. Hospital course complicated by hypotension and somnolence requiring pressors. Treated with IV lasix and BIPAP. Opsumit and selexipeg were resumed. Patient went to center for inpatient rehab on 5/20. Continues to require high flow oxygen 4-6 L and hypoxic with activity. Pulmonary consulted and recommended aggressive pulmonary hygiene, oxygen titration and IV lasix x1. Symptoms felt to be cardiogenic in nature and cardiology consulted. Dr. Mahalia Longest felt hypoxia d/t fluid overload with weight up to 194. Given IV lasix x 2 days. Discharge weight 185.   Had an apt with Dr. Jess Barters PA on 6/8, right ankle xray showed migration of hardware and fracture alignment. Covered with doxycycline for possible medial incision site infection/cellulitis. Saw orthopedics yesterday for follow-up, incision clean and dry. Patient wishes to try nonoperative management with non-weightbering in fracture boot. Follow-up in 2 weeks with orthopedics.   12/28/2018 Patient presents today for hospital follow-up. Discharged home June 2nd. Reports that her breathing has been stable. Denies wheezing or significant sob. Continues Dulera twice daily. She has not required rescue inhaler or nebulizer. Reports postnasal drip with morning congestion. Complains of increased left  lower leg swelling. Per hospital discharge instructions she was to continue lasix 83m twice daily. She then went to CIR and d/c summary has lasix listed as discontinued. She re-started lasix 416mdaily on her own a couple of days ago. States that she called her cardiologist office to let them know but did not hear back. She has been watching her fluid and sodium intake. Using compression stockings. No recent weight d/t fracture boot.   Allergies  Allergen Reactions   Amoxicillin Anaphylaxis, Hives, Rash and Other (See Comments)    Has patient had a PCN reaction causing immediate rash, facial/tongue/throat swelling, SOB or lightheadedness with hypotension: YES Positive reaction causing SEVERE RASH INVOLVING MUCUS MEMBRANES/SKIN NECROSIS: YES Reaction that required HOSPITALIZATION: YES Reaction occurring within the last 10 years: NO     There is no immunization history on file for this patient.  Past Medical History:  Diagnosis Date   Arthritis    CAD (coronary artery disease)    a. Prior cath 2015 showed 40% prox AD, 50-50% mLAD, otherwise calcification but no obstruction in LCx/RCA.. Medical management recommended. b. 2017: low-risk NST.   Chronic diastolic CHF (congestive heart failure) ([name])    Chronic respiratory failure ([name])    CKD (chronic kidney disease), stage III ([name])    COPD (chronic obstructive pulmonary disease) ([name])    Cor pulmonale ([name])    a. felt due to advanced COPD and noncompliance with O2.   Depression    Diabetes mellitus    [place]    dx  2008   Fracture    left foot   Hx of seasonal allergies    Hypercholesteremia    Hypertension    on no medications   Lung cancer ([place]  On home oxygen therapy    "2.5L; 24/7" (03/10/2017)   Pericardial effusion    a. small-moderate in 07/2016.   Pulmonary hypertension ([name])    UTI (urinary tract infection)     Tobacco History: Social History   Tobacco Use  Smoking Status Former Smoker    Packs/day: 1.00   Years: 30.00   Pack years: 30.00   Quit date: 07/14/2006   Years since quitting: 12.4  Smokeless Tobacco Never Used   Counseling given: Not Answered   Outpatient Medications Prior to Visit  Medication Sig Dispense Refill   acetaminophen (TYLENOL) 325 MG tablet Take 650 mg by mouth every 6 (six) hours as needed for moderate pain or fever.      citalopram (CELEXA) 10 MG tablet Take 1 tablet (10 mg total) by mouth daily. 30 tablet 0   diclofenac sodium (VOLTAREN) 1 % GEL Apply 2 g topically 4 (four) times daily. 3 Tube 1   doxycycline (VIBRAMYCIN) 100 MG capsule Take 1 capsule (100 mg total) by mouth 2 (two) times daily. 28 capsule 1   gabapentin (NEURONTIN) 300 MG capsule Take 1 capsule (300 mg total) by mouth at bedtime.     Insulin Detemir (LEVEMIR FLEXTOUCH) 100 UNIT/ML Pen Inject 30 Units into the skin at bedtime. 15 mL 11   ipratropium-albuterol (DUONEB) 0.5-2.5 (3) MG/3ML SOLN Take 3 mLs by nebulization every 4 (four) hours as needed (wheezing, Shortness of breath). 360 mL 0   iron polysaccharides (NIFEREX) 150 MG capsule Take 1 capsule (150 mg total) by mouth 2 (two) times daily. 60 capsule 1   macitentan (OPSUMIT) 10 MG tablet Take 1 tablet (10 mg total) by mouth daily. 30 tablet 11   mometasone-formoterol (DULERA) 200-5 MCG/ACT AERO Inhale 2 puffs into the lungs 2 (two) times daily. 13 g 1   OXYGEN Inhale 3 L into the lungs continuous. FOR COPD     polyethylene glycol (MIRALAX / GLYCOLAX) 17 g packet Take 17 g by mouth daily as needed for mild constipation.  0   Selexipag (UPTRAVI) 200 MCG TABS Take 2 tablets (400 mcg total) by mouth 2 (two) times a day.     sodium chloride (OCEAN) 0.65 % SOLN nasal spray Place 1 spray into both nostrils as needed for congestion.  0   TRULICITY 1.5 EZ/7.4JF SOPN Inject 1.5 mg into the vein once a week.      atorvastatin (LIPITOR) 40 MG tablet Take 1 tablet (40 mg total) by mouth daily. 90 tablet 3   docusate  sodium (COLACE) 100 MG capsule Take 1 capsule (100 mg total) by mouth 2 (two) times daily. (Patient not taking: Reported on 12/28/2018) 60 capsule 0   midodrine (PROAMATINE) 5 MG tablet Take 1 tablet (5 mg total) by mouth 3 (three) times daily with meals. (Patient not taking: Reported on 12/28/2018) 90 tablet 0   Facility-Administered Medications Prior to Visit  Medication Dose Route Frequency Provider Last Rate Last Dose   diatrizoate meglumine-sodium (GASTROGRAFIN) 66-10 % solution 30 mL  30 mL Oral PRN Bjorn Loser, MD   30 mL at 11/30/15 0820   Review of Systems  Review of Systems  Constitutional: Negative.   HENT: Positive for congestion and postnasal drip.   Respiratory: Positive for cough. Negative for shortness of breath and wheezing.   Cardiovascular: Positive for leg swelling.   Physical Exam  There were no vitals taken for this visit. Physical Exam Constitutional:      General: She is not in acute distress.  Appearance: Normal appearance. She is not ill-appearing or diaphoretic.  HENT:     Head: Normocephalic and atraumatic.     Nose: Nose normal.  Cardiovascular:     Rate and Rhythm: Normal rate and regular rhythm.     Comments: +2 LLE edema; RLE in walking cast Pulmonary:     Effort: Pulmonary effort is normal.     Breath sounds: No wheezing.     Comments: CTA Musculoskeletal: Normal range of motion.     Comments: In WC, right low extremity boot   Skin:    General: Skin is warm and dry.  Neurological:     General: No focal deficit present.     Mental Status: She is alert and oriented to person, place, and time. Mental status is at baseline.  Psychiatric:        Mood and Affect: Mood normal.        Behavior: Behavior normal.        Thought Content: Thought content normal.        Judgment: Judgment normal.      Lab Results:  CBC    Component Value Date/Time   WBC 6.9 12/13/2018 0837   RBC 3.95 12/13/2018 0837   HGB 9.6 (L) 12/13/2018 0837   HGB  9.5 (L) 07/21/2018 1032   HGB WILL FOLLOW 07/29/2016 1159   HGB 12.9 07/29/2016 1159   HGB 15.8 11/30/2015 0758   HCT 32.8 (L) 12/13/2018 0837   HCT WILL FOLLOW 07/29/2016 1159   HCT 42.6 07/29/2016 1159   HCT 47.5 (H) 11/30/2015 0758   PLT 243 12/13/2018 0837   PLT 203 07/21/2018 1032   PLT WILL FOLLOW 07/29/2016 1159   PLT 238 07/29/2016 1159   MCV 83.0 12/13/2018 0837   MCV WILL FOLLOW 07/29/2016 1159   MCV 84 07/29/2016 1159   MCV 86.3 11/30/2015 0758   MCH 24.3 (L) 12/13/2018 0837   MCHC 29.3 (L) 12/13/2018 0837   RDW 21.4 (H) 12/13/2018 0837   RDW WILL FOLLOW 07/29/2016 1159   RDW 15.6 (H) 07/29/2016 1159   RDW 14.8 (H) 11/30/2015 0758   LYMPHSABS 0.8 12/02/2018 0416   LYMPHSABS WILL FOLLOW 07/29/2016 1159   LYMPHSABS 1.0 07/29/2016 1159   LYMPHSABS 1.4 11/30/2015 0758   MONOABS 0.8 12/02/2018 0416   MONOABS 0.7 11/30/2015 0758   EOSABS 0.3 12/02/2018 0416   EOSABS WILL FOLLOW 07/29/2016 1159   EOSABS 0.2 07/29/2016 1159   BASOSABS 0.0 12/02/2018 0416   BASOSABS WILL FOLLOW 07/29/2016 1159   BASOSABS 0.0 07/29/2016 1159   BASOSABS 0.0 11/30/2015 0758    BMET    Component Value Date/Time   NA 139 12/14/2018 0813   NA 137 04/02/2017 1535   NA 136 11/30/2015 0758   K 5.2 (H) 12/14/2018 0813   K 4.2 11/30/2015 0758   CL 98 12/14/2018 0813   CL 104 08/30/2012 0908   CO2 29 12/14/2018 0813   CO2 27 11/30/2015 0758   GLUCOSE 233 (H) 12/14/2018 0813   GLUCOSE 339 (H) 11/30/2015 0758   GLUCOSE 191 (H) 08/30/2012 0908   BUN 65 (H) 12/14/2018 0813   BUN 27 04/02/2017 1535   BUN 19.0 11/30/2015 0758   CREATININE 1.66 (H) 12/14/2018 0813   CREATININE 1.66 (H) 07/21/2018 1032   CREATININE 0.77 03/28/2016 1504   CREATININE 1.2 (H) 11/30/2015 0758   CALCIUM 9.6 12/14/2018 0813   CALCIUM 10.3 11/30/2015 0758   GFRNONAA 30 (L) 12/14/2018 0813   GFRNONAA 31 (L) 07/21/2018  1032   GFRAA 35 (L) 12/14/2018 0813   GFRAA 36 (L) 07/21/2018 1032    BNP    Component  Value Date/Time   BNP 297.8 (H) 04/09/2018 1749   BNP 93.2 03/28/2016 1504    ProBNP    Component Value Date/Time   PROBNP 2,929 (H) 09/05/2016 1131    Imaging: Dg Chest 2 View  Result Date: 12/10/2018 CLINICAL DATA:  Left chest heaviness and productive cough, chronic. EXAM: CHEST - 2 VIEW COMPARISON:  PA and lateral chest 12/09/2018. Single-view of the chest 11/25/2018 and 01/29/2018. FINDINGS: Lungs are emphysematous. There is cardiomegaly and pulmonary vascular congestion. No consolidative process, pneumothorax or effusion. Aortic atherosclerosis is noted. No acute abnormality. IMPRESSION: No acute disease. Cardiomegaly and vascular congestion. COPD. Atherosclerosis. Electronically Signed   By: Inge Rise M.D.   On: 12/10/2018 14:35   Dg Chest 2 View  Result Date: 12/09/2018 CLINICAL DATA:  Shortness of breath. EXAM: CHEST - 2 VIEW COMPARISON:  Radiograph Nov 25, 2018. FINDINGS: Stable cardiomegaly. Atherosclerosis of thoracic aorta is noted. No pneumothorax or pleural effusion is noted. Mild central pulmonary vascular congestion is noted. Postsurgical changes are noted in the right lower lobe. Stable interstitial densities are noted throughout both lungs most consistent with chronic scarring, although acute superimposed edema or inflammation cannot be excluded. Bony thorax is unremarkable. IMPRESSION: Stable cardiomegaly. Postoperative changes seen in right lower lobe. Stable interstitial densities are noted throughout both lungs most consistent with chronic scarring, although acute superimposed edema or inflammation cannot be excluded. Aortic Atherosclerosis (ICD10-I70.0). Electronically Signed   By: Marijo Conception M.D.   On: 12/09/2018 10:54   Nm Pulmonary Perfusion  Result Date: 12/10/2018 CLINICAL DATA:  History of interstitial lung disease. Status post ankle fracture with ongoing hypoxia and chronic kidney disease. EXAM: NUCLEAR MEDICINE PERFUSION LUNG SCAN TECHNIQUE: Perfusion  images were obtained in multiple projections after intravenous injection of radiopharmaceutical. Ventilation scans intentionally deferred if perfusion scan and chest x-ray adequate for interpretation during COVID 19 epidemic. RADIOPHARMACEUTICALS:  1.6 mCi Tc-70mMAA IV COMPARISON:  None FINDINGS: There is a uniform distribution of the radiopharmaceutical within both lungs. No segmental perfusion defects identified to suggest an acute pulmonary embolus. IMPRESSION: 1. No evidence for acute pulmonary embolus. Electronically Signed   By: TKerby MoorsM.D.   On: 12/10/2018 15:27   Dg Toe Great Left  Result Date: 12/07/2018 CLINICAL DATA:  Left toe pain. EXAM: LEFT GREAT TOE COMPARISON:  04/10/2018. FINDINGS: Previous screw fixation of the base of the first metatarsal bone with the first cuneiform and navicular bone. There is peri-screw lucency and mild cortical bone loss at the level of the base of first proximal metatarsal bone. There are 2 focal bone erosions with overhanging edges involving the head of the second and third metatarsal bones. The joint spaces appear well preserved. IMPRESSION: 1. No acute fracture identified. 2. Status post hardware fusion of the first metatarsal with the first cuneiform and navicular bone. Here, there is peri screw lucency and suspected loss of bone at the base of the first metatarsal bone. If there is a clinical concern for loosening or infection consider further investigation with three-phase bone scintigraphy. 3. Small bone erosions with overhanging edges involve the head of the second and third metatarsal bones. Correlation for any clinical signs or symptoms of inflammatory arthropathy including crystalline deposition disease. Electronically Signed   By: TKerby MoorsM.D.   On: 12/07/2018 08:45   Xr Ankle Complete Right  Result Date: 12/27/2018  3 view radiographs of the right ankle shows no further migration of the fixation and stable displacement of fracture  fragments.  Xr Ankle Complete Right  Result Date: 12/20/2018 3 view radiographs of the right ankle shows migration of the fixation and displacement and migration of fracture fragments.    Assessment & Plan:   Chronic diastolic CHF (congestive heart failure) ([name]) - Increased left lower extremity edema - Most recent creatinine 1.32 - Resume lasix 20m twice daily  - Follow up with cardiology   Pulmonary HTN ([place] - Continue Uptravi and opsumit as directed  Chronic obstructive pulmonary disease ([place] - Continue Dulera twice daily and prn duoneb q4-6 hours prn wheezing   Chronic respiratory failure with hypoxia ([name]) - O2 saturation 96% 4L  - Continue oxygen 3-5 L continuous to keep O2 level >90%   EMartyn Ehrich NP 12/28/2018

## 2018-12-28 ENCOUNTER — Ambulatory Visit (INDEPENDENT_AMBULATORY_CARE_PROVIDER_SITE_OTHER): Payer: Medicare Other | Admitting: Primary Care

## 2018-12-28 ENCOUNTER — Encounter: Payer: Self-pay | Admitting: Primary Care

## 2018-12-28 VITALS — BP 140/86 | HR 70

## 2018-12-28 DIAGNOSIS — I272 Pulmonary hypertension, unspecified: Secondary | ICD-10-CM | POA: Diagnosis not present

## 2018-12-28 DIAGNOSIS — I5032 Chronic diastolic (congestive) heart failure: Secondary | ICD-10-CM | POA: Diagnosis not present

## 2018-12-28 DIAGNOSIS — J9611 Chronic respiratory failure with hypoxia: Secondary | ICD-10-CM

## 2018-12-28 DIAGNOSIS — J449 Chronic obstructive pulmonary disease, unspecified: Secondary | ICD-10-CM | POA: Diagnosis not present

## 2018-12-28 MED ORDER — AZELASTINE HCL 0.1 % NA SOLN: 1.0000 | Freq: Two times a day (BID) | NASAL | 6 refills | Status: DC

## 2018-12-28 NOTE — Assessment & Plan Note (Addendum)
-  O2 saturation 96% 4L  - Continue oxygen 3-5 L continuous to keep O2 level >90%

## 2018-12-28 NOTE — Assessment & Plan Note (Signed)
-  Continue Uptravi and opsumit as directed

## 2018-12-28 NOTE — Patient Instructions (Addendum)
Requesting labs from PCP office  Resume lasix 47m twice daily  Continue Dulera twice daily   Duoneb every 4-6 hours as needed for wheezing/shortness of breath   Continue Oxygen 3-5 L continuous to keep O2 level >90%  Keep compression stockings on and elevate both legs when sitting  Follow up with Dr. MClaris Gladdenoffice regarding leg swelling and diuretics  Follow-up with Dr. MVaughan Brownerin 3-4 months for regular office visit   BMET in 1 week

## 2018-12-28 NOTE — Assessment & Plan Note (Signed)
-  Increased left lower extremity edema - Most recent creatinine 1.32 - Resume lasix 82m twice daily  - Follow up with cardiology

## 2018-12-28 NOTE — Assessment & Plan Note (Signed)
-  Continue Dulera twice daily and prn duoneb q4-6 hours prn wheezing

## 2018-12-30 ENCOUNTER — Telehealth: Payer: Self-pay | Admitting: Medical Oncology

## 2018-12-30 NOTE — Telephone Encounter (Signed)
Asking if she needs ct scan because she just had nuc med perfusion scan in may. I told her it was a different test from CT scan and she still needs to keep appt for  CT scan to f/u lung cancer.

## 2018-12-30 NOTE — Telephone Encounter (Signed)
Yes.

## 2019-01-04 ENCOUNTER — Inpatient Hospital Stay: Payer: Medicare Other | Attending: Internal Medicine

## 2019-01-04 ENCOUNTER — Other Ambulatory Visit: Payer: Self-pay

## 2019-01-04 ENCOUNTER — Telehealth: Payer: Self-pay | Admitting: Medical Oncology

## 2019-01-04 ENCOUNTER — Ambulatory Visit (HOSPITAL_COMMUNITY)
Admission: RE | Admit: 2019-01-04 | Discharge: 2019-01-04 | Disposition: A | Discharge status: Home / Self Care | Payer: Medicare Other | Source: Ambulatory Visit | Attending: Internal Medicine | Admitting: Internal Medicine

## 2019-01-04 DIAGNOSIS — Z9981 Dependence on supplemental oxygen: Secondary | ICD-10-CM | POA: Insufficient documentation

## 2019-01-04 DIAGNOSIS — N183 Chronic kidney disease, stage 3 (moderate): Secondary | ICD-10-CM | POA: Insufficient documentation

## 2019-01-04 DIAGNOSIS — C3490 Malignant neoplasm of unspecified part of unspecified bronchus or lung: Secondary | ICD-10-CM | POA: Diagnosis present

## 2019-01-04 DIAGNOSIS — E119 Type 2 diabetes mellitus without complications: Secondary | ICD-10-CM | POA: Diagnosis not present

## 2019-01-04 DIAGNOSIS — Z794 Long term (current) use of insulin: Secondary | ICD-10-CM | POA: Insufficient documentation

## 2019-01-04 DIAGNOSIS — Z79899 Other long term (current) drug therapy: Secondary | ICD-10-CM | POA: Diagnosis not present

## 2019-01-04 DIAGNOSIS — D649 Anemia, unspecified: Secondary | ICD-10-CM | POA: Insufficient documentation

## 2019-01-04 DIAGNOSIS — Z85118 Personal history of other malignant neoplasm of bronchus and lung: Secondary | ICD-10-CM | POA: Insufficient documentation

## 2019-01-04 DIAGNOSIS — Z7982 Long term (current) use of aspirin: Secondary | ICD-10-CM | POA: Insufficient documentation

## 2019-01-04 DIAGNOSIS — I10 Essential (primary) hypertension: Secondary | ICD-10-CM | POA: Insufficient documentation

## 2019-01-04 DIAGNOSIS — Z9221 Personal history of antineoplastic chemotherapy: Secondary | ICD-10-CM | POA: Insufficient documentation

## 2019-01-04 LAB — CBC WITH DIFFERENTIAL (CANCER CENTER ONLY)
Abs Immature Granulocytes: 0.03 10*3/uL (ref 0.00–0.07)
Basophils Absolute: 0 10*3/uL (ref 0.0–0.1)
Basophils Relative: 0 %
Eosinophils Absolute: 0.2 10*3/uL (ref 0.0–0.5)
Eosinophils Relative: 3 %
HCT: 33.5 % — ABNORMAL LOW (ref 36.0–46.0)
Hemoglobin: 10 g/dL — ABNORMAL LOW (ref 12.0–15.0)
Immature Granulocytes: 0 %
Lymphocytes Relative: 12 %
Lymphs Abs: 0.9 10*3/uL (ref 0.7–4.0)
MCH: 24.7 pg — ABNORMAL LOW (ref 26.0–34.0)
MCHC: 29.9 g/dL — ABNORMAL LOW (ref 30.0–36.0)
MCV: 82.7 fL (ref 80.0–100.0)
Monocytes Absolute: 0.8 10*3/uL (ref 0.1–1.0)
Monocytes Relative: 10 %
Neutro Abs: 5.8 10*3/uL (ref 1.7–7.7)
Neutrophils Relative %: 75 %
Platelet Count: 188 10*3/uL (ref 150–400)
RBC: 4.05 MIL/uL (ref 3.87–5.11)
RDW: 18.6 % — ABNORMAL HIGH (ref 11.5–15.5)
WBC Count: 7.8 10*3/uL (ref 4.0–10.5)
nRBC: 0 % (ref 0.0–0.2)

## 2019-01-04 LAB — CMP (CANCER CENTER ONLY)
ALT: 9 U/L (ref 0–44)
AST: 19 U/L (ref 15–41)
Albumin: 3.1 g/dL — ABNORMAL LOW (ref 3.5–5.0)
Alkaline Phosphatase: 162 U/L — ABNORMAL HIGH (ref 38–126)
Anion gap: 7 (ref 5–15)
BUN: 35 mg/dL — ABNORMAL HIGH (ref 8–23)
CO2: 29 mmol/L (ref 22–32)
Calcium: 9.3 mg/dL (ref 8.9–10.3)
Chloride: 107 mmol/L (ref 98–111)
Creatinine: 1.06 mg/dL — ABNORMAL HIGH (ref 0.44–1.00)
GFR, Est AFR Am: >60 mL/min (ref >60)
GFR, Estimated: 52 mL/min — ABNORMAL LOW (ref >60)
Glucose, Bld: 42 mg/dL — CL (ref 70–99)
Potassium: 4.2 mmol/L (ref 3.5–5.1)
Sodium: 143 mmol/L (ref 135–145)
Total Bilirubin: 0.2 mg/dL — ABNORMAL LOW (ref 0.3–1.2)
Total Protein: 6.1 g/dL — ABNORMAL LOW (ref 6.5–8.1)

## 2019-01-04 MED ORDER — IOHEXOL 300 MG/ML  SOLN
75.0000 mL | Freq: Once | INTRAMUSCULAR | Status: AC | PRN
  Administered 2019-01-04: 75 mL via INTRAVENOUS

## 2019-01-04 MED ORDER — SODIUM CHLORIDE (PF) 0.9 % IJ SOLN
INTRAMUSCULAR | Status: AC
  Filled 2019-01-04: qty 50

## 2019-01-04 NOTE — Telephone Encounter (Signed)
Critical glucose 43 called to me. PT in CT -Went to Ct and pt asymptomatic. She stated she took 5 units of insulin at 7:30 this am. I  did finger stick blood glucose and it was 55. Gave pt orange juice to drink.

## 2019-01-05 ENCOUNTER — Other Ambulatory Visit: Payer: Self-pay | Admitting: Physical Medicine and Rehabilitation

## 2019-01-05 LAB — GLUCOSE, CAPILLARY: Glucose-Capillary: 55 mg/dL — ABNORMAL LOW (ref 70–99)

## 2019-01-06 ENCOUNTER — Inpatient Hospital Stay: Payer: Medicare Other | Admitting: Internal Medicine

## 2019-01-10 ENCOUNTER — Ambulatory Visit (INDEPENDENT_AMBULATORY_CARE_PROVIDER_SITE_OTHER): Payer: Medicare Other

## 2019-01-10 ENCOUNTER — Encounter: Payer: Self-pay | Admitting: Orthopedic Surgery

## 2019-01-10 ENCOUNTER — Ambulatory Visit (INDEPENDENT_AMBULATORY_CARE_PROVIDER_SITE_OTHER): Payer: Medicare Other | Admitting: Orthopedic Surgery

## 2019-01-10 ENCOUNTER — Other Ambulatory Visit: Payer: Self-pay

## 2019-01-10 VITALS — Ht 62.0 in | Wt 185.2 lb

## 2019-01-10 DIAGNOSIS — E118 Type 2 diabetes mellitus with unspecified complications: Secondary | ICD-10-CM

## 2019-01-10 DIAGNOSIS — J9611 Chronic respiratory failure with hypoxia: Secondary | ICD-10-CM

## 2019-01-10 DIAGNOSIS — S82891P Other fracture of right lower leg, subsequent encounter for closed fracture with malunion: Secondary | ICD-10-CM | POA: Diagnosis not present

## 2019-01-10 DIAGNOSIS — Z794 Long term (current) use of insulin: Secondary | ICD-10-CM

## 2019-01-10 NOTE — Progress Notes (Signed)
Office Visit Note   Patient: Amy Shepard           Date of Birth: [birth year]           MRN: [id number] Visit Date: 01/10/2019              Requested by: Maurice Small, MD [address] [place],  [place] [id number] PCP: Maurice Small, MD  Chief Complaint  Patient presents with  . Right Ankle - Routine Post Op    11/23/18 ORIF right ankle fx      HPI: The patient is a 73 year old woman who is seen for postoperative follow-up following open reduction internal fixation of a right closed trimalleolar fracture on 11/23/2018.  She was unable to maintain her nonweightbearing status and developed migration of her hardware and loss of the fracture alignment.  She has been maintained nonweightbearing in her fracture boot. Over the past 2 weeks she is continued to maintain nonweightbearing and is walking with a walker and a fracture boot.  She reports no pain over the right ankle, but some occasional mild burning sensations about the right great toe.  Assessment & Plan: Visit Diagnoses:  1. Closed fracture of right ankle with malunion, subsequent encounter   2. Chronic respiratory failure with hypoxia ([name])   3. Type 2 diabetes mellitus with complication, with long-term current use of insulin ([place])     Plan:  The patient would like to continue all efforts at conservation treatment. Radiographs reviewed with Dr. Sharol Given. Okay to resume Physical therapy at home weight bearing as tolerated in fracture boot and walk with walker. Orders to be sent to [facility] to resume PT. Toe nails trimmed today as patient is unable to safely do at home .  Follow up in 2 weeks with radiographs of right ankle at that time.   Follow-Up Instructions: Return in about 2 weeks (around 01/24/2019).   Ortho Exam  Patient is alert, oriented, no adenopathy, well-dressed, normal affect, normal respiratory effort. The right ankle incisions remain intact and without signs of infection or cellulitis.  Palpable  hardware over the right lateral ankle. Non tender to palpation. Good pedal pulses. Toe nails trimmed. Ace wrap and fracture boot reapplied.  Imaging: Xr Ankle Complete Right  Result Date: 01/10/2019 Radiographs of right ankle show no change in alignment of displaced fracture and displaced internal fixation.   No images are attached to the encounter.  Labs: Lab Results  Component Value Date   HGBA1C 7.2 (H) 04/11/2018   HGBA1C 14.4 (H) 05/02/2017   HGBA1C 13.6 (H) 03/11/2017   ESRSEDRATE 83 (H) 04/13/2018   ESRSEDRATE 37 (H) 08/13/2016   CRP 0.3 (L) 09/11/2016   REPTSTATUS 03/14/2018 FINAL 03/11/2018   CULT >=100,000 COLONIES/mL ESCHERICHIA COLI (A) 03/11/2018   LABORGA ESCHERICHIA COLI (A) 03/11/2018     Lab Results  Component Value Date   ALBUMIN 3.1 (L) 01/04/2019   ALBUMIN 2.8 (L) 12/02/2018   ALBUMIN 3.1 (L) 11/25/2018    Body mass index is 33.87 kg/m.  Orders:  Orders Placed This Encounter  Procedures  . XR Ankle Complete Right   No orders of the defined types were placed in this encounter.    Procedures: No procedures performed  Clinical Data: No additional findings.  ROS:  All other systems negative, except as noted in the HPI. Review of Systems  Objective: Vital Signs: Ht _0  (1.575 m)   Wt 185 lb 3 oz (84 kg)   BMI 33.87 kg/m  Specialty Comments:  No specialty comments available.  PMFS History: Patient Active Problem List   Diagnosis Date Noted  . SOB (shortness of breath)   . Acute on chronic systolic (congestive) heart failure ([place])   . 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 Diabetes mellitus type 2 in obese ([place])   . Supplemental oxygen dependent   . Trimalleolar fracture of ankle, closed, right, sequela 12/01/2018  . Hypoxia   . Acute on chronic anemia   . Closed dislocation of right talus   . Acute on chronic postoperative respiratory failure ([place])   .  Postprocedural hypotension   . Trimalleolar fracture of ankle, closed, right, initial encounter   . Syndesmotic disruption of ankle, right, initial encounter   . Ankle fracture 11/22/2018  . Pain in joint, ankle and foot 04/10/2018  . Closed left fibular fracture 04/10/2018  . CKD (chronic kidney disease), stage IV ([place]) 04/09/2018  . Near syncope 04/09/2018  . Dyspnea   . Bronchitis, acute 03/11/2018  . Fatigue 03/11/2018  . Anemia due to stage 4 chronic kidney disease ([place]) 03/11/2018  . Pulmonary fibrosis ([place]) 03/11/2018  . Pulmonary HTN ([place]) 03/11/2018  . Anemia 03/11/2018  . Lisfranc dislocation, left, subsequent encounter 06/08/2017  . Chronic respiratory failure with hypoxia ([place]) 06/03/2017  . Falls 06/03/2017  . Hypokalemia 06/03/2017  . Sepsis secondary to UTI ([place]) 06/02/2017  . Dog bite 05/02/2017  . CHF exacerbation ([place]) 05/02/2017  . Acute diastolic CHF (congestive heart failure) ([place]) 05/02/2017  . Chronic diastolic CHF (congestive heart failure) ([place]) 04/02/2017  . Cor pulmonale ([place]) 04/02/2017  . Acute on chronic respiratory failure ([place]) 03/10/2017  . Chronic pain 03/10/2017  . Lactic acidosis 02/10/2017  . Acute respiratory failure with hypoxia ([place]) 02/09/2017  . Acute on chronic diastolic CHF (congestive heart failure) ([place]) 06/23/2016  . HTN (hypertension) 06/23/2016  . Type 2 diabetes mellitus with complication, with long-term current use of insulin ([place]) 03/11/2016  . Depression 03/11/2016  . Chronic obstructive pulmonary disease ([place]) 03/10/2016  . S/P lumbar spinal fusion 02/22/2016  . Coronary artery disease involving native coronary artery 07/12/2014  . Pain in the chest   . Malignant neoplasm of lower lobe of right lung ([place]) 05/25/2014  . Lung cancer ([place]) 09/01/2012   Past Medical History:  Diagnosis Date  . Arthritis   . CAD (coronary artery disease)    a. Prior cath 2015 showed 40% prox AD, 50-50% mLAD, otherwise calcification but no  obstruction in LCx/RCA.. Medical management recommended. b. 2017: low-risk NST.  [name] Chronic diastolic CHF (congestive heart failure) ([place])   . Chronic respiratory failure ([place])   . CKD (chronic kidney disease), stage III ([place])   . COPD (chronic obstructive pulmonary disease) ([place])   . Cor pulmonale ([name])    a. felt due to advanced COPD and noncompliance with O2.  . Depression   . Diabetes mellitus    [name]    dx  2008  . Fracture    left foot  . Hx of seasonal allergies   . Hypercholesteremia   . Hypertension    on no medications  . Lung cancer ([place])   . On home oxygen therapy    "2.5L; 24/7" (03/10/2017)  . Pericardial effusion    a. small-moderate in 07/2016.  . Pulmonary hypertension ([place])   . UTI (urinary tract infection)     Family History  Problem Relation Age of Onset  .  Diabetes Father   . Heart failure Father   . Cancer Sister   . Diabetes Brother   . Heart failure Brother     Past Surgical History:  Procedure Laterality Date  . APPLICATION OF WOUND VAC Right 11/23/2018   Procedure: Application Of Wound Vac;  Surgeon: Newt Minion, MD;  Location: [facility];  Service: Orthopedics;  Laterality: Right;  . [facility] or [facility] in [place]     bilateral cataracts  . LEFT HEART CATHETERIZATION WITH CORONARY ANGIOGRAM N/A 07/12/2014   Procedure: LEFT HEART CATHETERIZATION WITH CORONARY ANGIOGRAM;  Surgeon: [name], MD;  Location: [facility] CATH LAB;  Service: Cardiovascular;  Laterality: N/A;  . MAXIMUM ACCESS (MAS)POSTERIOR LUMBAR INTERBODY FUSION (PLIF) 2 LEVEL N/A 02/22/2016   Procedure: Lumbar three-four - Lumbar four-five  MAXIMUM ACCESS SURGERY  POSTERIOR LUMBAR INTERBODY FUSION;  Surgeon: [name], MD;  Location: [facility]URO [facility]S;  Service: Neurosurgery;  Laterality: N/A;  . ORIF ANKLE FRACTURE Right 11/23/2018   Procedure: OPEN REDUCTION INTERNAL FIXATION (ORIF) RIGHT ANKLE FRACTURE;  Surgeon: Newt Minion, MD;   Location: [facility];  Service: Orthopedics;  Laterality: Right;  . ORIF TOE FRACTURE Left 06/12/2017   Procedure: OPEN REDUCTION INTERNAL FIXATION (ORIF) BASE 1ST METATARSAL (TOE) FRACTURE;  Surgeon: Newt Minion, MD;  Location: [facility];  Service: Orthopedics;  Laterality: Left;  . RIGHT HEART CATH N/A 08/06/2017   Procedure: RIGHT HEART CATH;  Surgeon: Larey Dresser, MD;  Location: [facility];  Service: Cardiovascular;  Laterality: N/A;  . THORACOTOMY Right 2010   lower  . TONSILLECTOMY     Social History   Occupational History  . Not on file  Tobacco Use  . Smoking status: Former Smoker    Packs/day: 1.00    Years: 30.00    Pack years: 30.00    Quit date: 07/14/2006    Years since quitting: 12.5  . Smokeless tobacco: Never Used  Substance and Sexual Activity  . Alcohol use: No  . Drug use: No  . Sexual activity: Not on file

## 2019-01-11 ENCOUNTER — Ambulatory Visit (HOSPITAL_COMMUNITY)
Admission: RE | Admit: 2019-01-11 | Discharge: 2019-01-11 | Disposition: A | Discharge status: Home / Self Care | Payer: Medicare Other | Source: Ambulatory Visit | Attending: Cardiology | Admitting: Cardiology

## 2019-01-11 ENCOUNTER — Encounter (HOSPITAL_COMMUNITY): Payer: Self-pay

## 2019-01-11 VITALS — BP 112/62 | HR 70 | Wt 174.4 lb

## 2019-01-11 DIAGNOSIS — I5032 Chronic diastolic (congestive) heart failure: Secondary | ICD-10-CM | POA: Diagnosis not present

## 2019-01-11 DIAGNOSIS — J9611 Chronic respiratory failure with hypoxia: Secondary | ICD-10-CM | POA: Diagnosis not present

## 2019-01-11 DIAGNOSIS — Z7951 Long term (current) use of inhaled steroids: Secondary | ICD-10-CM | POA: Diagnosis not present

## 2019-01-11 DIAGNOSIS — J449 Chronic obstructive pulmonary disease, unspecified: Secondary | ICD-10-CM | POA: Diagnosis not present

## 2019-01-11 DIAGNOSIS — Z791 Long term (current) use of non-steroidal anti-inflammatories (NSAID): Secondary | ICD-10-CM | POA: Insufficient documentation

## 2019-01-11 DIAGNOSIS — I951 Orthostatic hypotension: Secondary | ICD-10-CM | POA: Diagnosis not present

## 2019-01-11 DIAGNOSIS — I272 Pulmonary hypertension, unspecified: Secondary | ICD-10-CM | POA: Diagnosis not present

## 2019-01-11 DIAGNOSIS — Z85118 Personal history of other malignant neoplasm of bronchus and lung: Secondary | ICD-10-CM | POA: Diagnosis not present

## 2019-01-11 DIAGNOSIS — E1122 Type 2 diabetes mellitus with diabetic chronic kidney disease: Secondary | ICD-10-CM | POA: Diagnosis not present

## 2019-01-11 DIAGNOSIS — N183 Chronic kidney disease, stage 3 unspecified: Secondary | ICD-10-CM

## 2019-01-11 DIAGNOSIS — Z809 Family history of malignant neoplasm, unspecified: Secondary | ICD-10-CM | POA: Diagnosis not present

## 2019-01-11 DIAGNOSIS — Z9221 Personal history of antineoplastic chemotherapy: Secondary | ICD-10-CM | POA: Diagnosis not present

## 2019-01-11 DIAGNOSIS — Z79899 Other long term (current) drug therapy: Secondary | ICD-10-CM | POA: Insufficient documentation

## 2019-01-11 DIAGNOSIS — I2510 Atherosclerotic heart disease of native coronary artery without angina pectoris: Secondary | ICD-10-CM | POA: Insufficient documentation

## 2019-01-11 DIAGNOSIS — I2721 Secondary pulmonary arterial hypertension: Secondary | ICD-10-CM | POA: Insufficient documentation

## 2019-01-11 DIAGNOSIS — Z8249 Family history of ischemic heart disease and other diseases of the circulatory system: Secondary | ICD-10-CM | POA: Insufficient documentation

## 2019-01-11 DIAGNOSIS — Z794 Long term (current) use of insulin: Secondary | ICD-10-CM | POA: Diagnosis not present

## 2019-01-11 DIAGNOSIS — I130 Hypertensive heart and chronic kidney disease with heart failure and stage 1 through stage 4 chronic kidney disease, or unspecified chronic kidney disease: Secondary | ICD-10-CM | POA: Insufficient documentation

## 2019-01-11 DIAGNOSIS — Z87891 Personal history of nicotine dependence: Secondary | ICD-10-CM | POA: Diagnosis not present

## 2019-01-11 DIAGNOSIS — E785 Hyperlipidemia, unspecified: Secondary | ICD-10-CM | POA: Insufficient documentation

## 2019-01-11 DIAGNOSIS — S82891D Other fracture of right lower leg, subsequent encounter for closed fracture with routine healing: Secondary | ICD-10-CM | POA: Insufficient documentation

## 2019-01-11 DIAGNOSIS — Z9981 Dependence on supplemental oxygen: Secondary | ICD-10-CM | POA: Diagnosis not present

## 2019-01-11 DIAGNOSIS — Z833 Family history of diabetes mellitus: Secondary | ICD-10-CM | POA: Insufficient documentation

## 2019-01-11 MED ORDER — FUROSEMIDE 40 MG PO TABS: 60.0000 mg | ORAL_TABLET | Freq: Two times a day (BID) | ORAL | 3 refills | Status: DC

## 2019-01-11 NOTE — Patient Instructions (Signed)
Labs were done today. We will call you with any ABNORMAL results. No news is good news!  INCREASE Lasix 60 mg (1.5 tab) twice a day.  Please repeat labs in 10 days.  Your physician recommends that you schedule a follow-up appointment in: 4-6 weeks.  At the [facility], you and your health needs are our priority. As part of our continuing mission to provide you with exceptional heart care, we have created designated Provider Care Teams. These Care Teams include your primary Cardiologist (physician) and Advanced Practice Providers (APPs- Physician Assistants and Nurse Practitioners) who all work together to provide you with the care you need, when you need it.   You may see any of the following providers on your designated Care Team at your next follow up: [name] Dr Glori Bickers . Dr [name] . Darrick Grinder, NP

## 2019-01-11 NOTE — Progress Notes (Signed)
PCP: Dr. Justin Mend HF Cardiology: Dr. Aundra Dubin  73 y.o. with history of hypoxemic respiratory failure, chronic diastolic CHF, pulmonary hypertension, prior lung cancer, and CKD stage 3 returns for followup of pulmonary hypertension.  Patient wears home oxygen.  PFTs in 3/18 showed moderate-severe restriction but only minimal obstruction.  CTA chest in 1/18 showed relatively normal lung parenchyma and V/Q scan in 8/18 showed no chronic or acute PE.  Echo in 8/18 showed normal LV EF with dilated and dysfunctional RV.  She was admitted in 10/18 with acute/chronic diastolic CHF and was diuresed with IV Lasix.    We had planned a RHC in the fall of 2018 but she developed a Lisfranc fracture in her left foot requiring internal fixation and also had an admission with urosepsis.  RHC was postponed. Echo in 11/18 showed EF 55%, RV moderately dilated, PASP 51 mmHg. RHC was later done in 1/19, showing moderate PAH.   Patient was admitted in 9/19 with presyncope. She was orthostatic.  Opsumit, lisinopril, and spironolactone were stopped. She developed visual changes/blurry vision and stopped tadalafil.  Stopping tadalafil did not help.  She is seeing an ophthalmologist for the visual changes, MRI head was unremarkable.  Echo in 9/19 showed EF 55-60% with septal flattening, mildly dilated RV with moderately decreased systolic function, PA systolic pressure 81 mmHg.   Admitted 11/26/18 after a fall and had R ankle fracture. She underwent ORIF with complicated post operative course. She was discharged from acute care to CIR on 12/01/2018 and later to home 12/14/2018. She was not discharged on diuretics.   Today she returns for post hospital follow up. Overall feeling fine. Last week she started getting short of breath and leg edema. so she started taking lasix 40 mg twice a day. Remains SOB with exertion. Denies PND/Orthopnea. She has been wheel chair bound. Appetite ok. No fever or chills. Unable to weigh due to RLE boot.  Taking all medications. Lives alone.   6 minute walk (4/19): 67 m  PMH: 1. Chronic hypoxemic respiratory failure: On home oxygen.  2. COPD: This diagnosis has been listed, but PFTs in 3/18 did not show significant obstruction.  3. Chronic diastolic CHF: With cor pulmonale.  - Echo (8/18): EF 65-70%, severe RV dilation with decreased RV systolic function, PASP 67 mmHg.  - Echo (11/18): EF 55%, PASP 51 mmHg, RV moderately dilated.  - Echo (9/19): EF 55-60%, septal flattening, PASP 81 mmHg, RV mildly dilated with moderately decreased systolic function.  4. Type II diabetes 5. HTN 6. Hyperlipidemia 7. CKD: Stage 3.  8. CAD: LHC (2015) with 40% pLAD, 50% mLAD.  9. Lung cancer: Treated in 2010 with chemotherapy and resection.  No recurrence.  10. 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   - V/Q scan negative for chronic PE 8/18.  - CTA chest (1/18): No PE, no emphysema, no ILD. - PFTs (3/18): moderate-severe restriction with minimal obstruction, severely decreased DLCO. FVC 63%, FEV1 72%, ratio 114%.  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  - Sleep study (2/19): No OSA, there is nocturnal hypoxemia.  - RHC (1/19): mean RA 7, PA 55/16 mean 34, mean PCWP 15, CI 2.34.   SH: son and daughter in [place], quit smoking in 2008.   Family History  Problem Relation Age of Onset  . Diabetes Father   . Heart failure Father   . Cancer Sister   . Diabetes Brother   . Heart failure Brother    Review of systems  complete and found to be negative unless listed in HPI.   Current Outpatient Medications  Medication Sig Dispense Refill  . acetaminophen (TYLENOL) 325 MG tablet Take 650 mg by mouth every 6 (six) hours as needed for moderate pain or fever.     [name] atorvastatin (LIPITOR) 40 MG tablet Take 1 tablet (40 mg total) by mouth daily. 90 tablet 3  . azelastine (ASTELIN) 0.1 % nasal spray Place 1 spray into both nostrils 2 (two) times daily. Use in each nostril as  directed 30 mL 6  . citalopram (CELEXA) 10 MG tablet TAKE 1 TABLET BY MOUTH EVERY DAY 30 tablet 0  . diclofenac sodium (VOLTAREN) 1 % GEL Apply 2 g topically 4 (four) times daily. 3 Tube 1  . docusate sodium (COLACE) 100 MG capsule Take 1 capsule (100 mg total) by mouth 2 (two) times daily. 60 capsule 0  . gabapentin (NEURONTIN) 300 MG capsule Take 1 capsule (300 mg total) by mouth at bedtime.    . Insulin Detemir (LEVEMIR FLEXTOUCH) 100 UNIT/ML Pen Inject 30 Units into the skin at bedtime. 15 mL 11  . ipratropium-albuterol (DUONEB) 0.5-2.5 (3) MG/3ML SOLN Take 3 mLs by nebulization every 4 (four) hours as needed (wheezing, Shortness of breath). 360 mL 0  . iron polysaccharides (NIFEREX) 150 MG capsule Take 1 capsule (150 mg total) by mouth 2 (two) times daily. 60 capsule 1  . macitentan (OPSUMIT) 10 MG tablet Take 1 tablet (10 mg total) by mouth daily. 30 tablet 11  . midodrine (PROAMATINE) 5 MG tablet Take 1 tablet (5 mg total) by mouth 3 (three) times daily with meals. 90 tablet 0  . mometasone-formoterol (DULERA) 200-5 MCG/ACT AERO Inhale 2 puffs into the lungs 2 (two) times daily. 13 g 1  . OXYGEN Inhale 3 L into the lungs continuous. FOR COPD    . polyethylene glycol (MIRALAX / GLYCOLAX) 17 g packet Take 17 g by mouth daily as needed for mild constipation.  0  . Selexipag (UPTRAVI) 200 MCG TABS Take 2 tablets (400 mcg total) by mouth 2 (two) times a day.    . sodium chloride (OCEAN) 0.65 % SOLN nasal spray Place 1 spray into both nostrils as needed for congestion.  0  . TRULICITY 1.5 KV/4.2VZ SOPN Inject 1.5 mg into the vein once a week.      No current facility-administered medications for this encounter.    Facility-Administered Medications Ordered in Other Encounters  Medication Dose Route Frequency Provider Last Rate Last Dose  . diatrizoate meglumine-sodium (GASTROGRAFIN) 66-10 % solution 30 mL  30 mL Oral PRN Bjorn Loser, MD   30 mL at 11/30/15 0820   BP 112/62   Pulse 70    Wt 79.1 kg (174 lb 6.4 oz)   SpO2 92%   BMI 31.90 kg/m    Wt Readings from Last 3 Encounters:  01/11/19 79.1 kg (174 lb 6.4 oz)  01/10/19 84 kg (185 lb 3 oz)  12/27/18 84 kg (185 lb 3 oz)    General: Appears chronically ill. No resp difficulty. Arrived in a wheel chair.  HEENT: normal Neck: supple. JVP 9-10 . Carotids 2+ bilat; no bruits. No lymphadenopathy or thryomegaly appreciated. Cor: PMI nondisplaced. Regular rate & rhythm. No rubs, gallops or murmurs. Lungs: clear on 3 liters oxygen  Abdomen: soft, nontender, nondistended. No hepatosplenomegaly. No bruits or masses. Good bowel sounds. Extremities: no cyanosis, clubbing, rash, LLE 1+ edema. RLE orthotic boot Neuro: alert & orientedx3, cranial nerves grossly intact. moves  all 4 extremities w/o difficulty. Affect pleasant  Assessment/Plan: 1. 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  Serologies negative.  Restrictive PFTs but lung parenchyma looked relatively normal on 1/18 CTA chest.  Findings by PFTs and CT not consistent with emphysema. V/Q scan negative for chronic or acute PEs. Serologic workup was negative. RHC (1/19) showed moderate pulmonary arterial hypertension. Echo in 8/18 showed dilated and dysfunctional RV, similar in 11/18 and 9/19.  Echo in 2/41 showed PA systolic pressure still very high at 81 mmHg.  Sleep study showed nocturnal hypoxemia but not OSA.  She wears home oxygen.  She is currently on Opsumit and selexipag. Continue current dose of selexipag. Intolerant higher doses.  - Continue to wear home oxygen.  - Continue Opsumit.  - Given visual changes, will not have her restart tadalafil.   -2. Chronic hypoxemic respiratory failure: ?Primarily due to pulmonary hypertension.  - She has been seen by Dr Vaughan Browner in the past. Encouraged her to make f/u appointment.  3. Chronic diastolic CHF: With prominent RV failure.  NYHA III. Volume status elevated. Increase lasix to 60 mg twice a day.  - Check BMET today and in  7 days by [facility].  4. CKD: Stage 3. Creatinine stable at 1.66 on labwork 07/21/18  5. CAD: Nonobstructive in 2015.  No exertional CP.  LDL very high in 4/19 but was not on statin.  - No chest pain.  - Continue atorvastatin 40 mg daily.  6. Orthostatic hypotension: RV failure may play a role.   - Continue compression stockings during the day.  - Continue midodrine 5 mg TID 7. R ankle Fracture, S/P ORIF 11/26/2018.   Follow up 4-6 weeks with Dr Aundra Dubin to assess volume status.   Darrick Grinder, NP 01/11/2019

## 2019-01-12 ENCOUNTER — Telehealth: Payer: Self-pay | Admitting: Radiology

## 2019-01-12 NOTE — Telephone Encounter (Signed)
Beth from [facility] called to confirm if patient is to continue home PT, had not received orders yet.  I confirmed from notes okay to resume physical therapy at home, weight bearing as tolerated in fracture boot and walk with walker.  Requested to fax order to [phone] Stated some numbers had changed and may be why they have not received orders yet Do you want me to re-fax?

## 2019-01-13 ENCOUNTER — Other Ambulatory Visit: Payer: Self-pay | Admitting: Radiology

## 2019-01-13 DIAGNOSIS — S82891P Other fracture of right lower leg, subsequent encounter for closed fracture with malunion: Secondary | ICD-10-CM

## 2019-01-13 NOTE — Telephone Encounter (Signed)
done

## 2019-01-13 NOTE — Telephone Encounter (Signed)
Yes that would be great thank you!

## 2019-01-20 ENCOUNTER — Other Ambulatory Visit: Payer: Self-pay

## 2019-01-20 ENCOUNTER — Inpatient Hospital Stay: Payer: Medicare Other | Attending: Internal Medicine | Admitting: Internal Medicine

## 2019-01-20 ENCOUNTER — Inpatient Hospital Stay: Payer: Medicare Other | Admitting: Internal Medicine

## 2019-01-20 ENCOUNTER — Telehealth: Payer: Self-pay | Admitting: Medical Oncology

## 2019-01-20 ENCOUNTER — Telehealth: Payer: Self-pay | Admitting: Internal Medicine

## 2019-01-20 ENCOUNTER — Encounter: Payer: Self-pay | Admitting: Internal Medicine

## 2019-01-20 DIAGNOSIS — C3431 Malignant neoplasm of lower lobe, right bronchus or lung: Secondary | ICD-10-CM

## 2019-01-20 DIAGNOSIS — C3490 Malignant neoplasm of unspecified part of unspecified bronchus or lung: Secondary | ICD-10-CM

## 2019-01-20 NOTE — Telephone Encounter (Signed)
Scheduled appt per 7/09 los - f/u and scan in 6 months . Reminder letter mailed with appt date and time

## 2019-01-20 NOTE — Telephone Encounter (Signed)
Pt said she called earlier to cancel appt -she passed out and had diarrhea. She would like to r/s today with a phone call.Schedule message sent.

## 2019-01-20 NOTE — Progress Notes (Signed)
[place] [phone]   [phone]  PROGRESS NOTE FOR TELEMEDICINE VISITS  Maurice Small, MD [address] [place] [address]  I connected with@ on 01/20/19 at  1:30 PM EDT by telephone visit and verified that I am speaking with the correct person using two identifiers.   I discussed the limitations, risks, security and privacy concerns of performing an evaluation and management service by telemedicine and the availability of in-person appointments. I also discussed with the patient that there may be a patient responsible charge related to this service. The patient expressed understanding and agreed to proceed.  Other persons participating in the visit and their role in the encounter:  None.  Patient's location:  Home Provider's location: [place].  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   PRIOR THERAPY:  1. Status post right lower lobe superior segmentectomy with lymph node dissection under the care of Dr. Arlyce Dice on July 20, 2008. 2. Status post adjuvant chemotherapy with carboplatin and etoposide for a total of 4 cycles. Last dose was given 10/25/2008.  CURRENT THERAPY: Observation.  INTERVAL HISTORY: Amy Shepard 73 y.o. female has a telephone virtual visit with me today for evaluation and discussion of her scan results.  The patient is feeling fine today with no concerning complaints except for some hoarseness of her voice but not significantly changed from before.  She denied having any chest pain but has a baseline shortness of breath increased with exertion with no cough or hemoptysis.  She has no recent weight loss or night sweats.  She has no fever or chills she has no sore throat.  The patient denied having any nausea, vomiting, diarrhea or constipation.  She had CT scan of the chest performed recently  and we are having the visit for evaluation and discussion of HIDA scan results.  MEDICAL HISTORY: Past Medical History:  Diagnosis Date   Arthritis    CAD (coronary artery disease)    a. Prior cath 2015 showed 40% prox AD, 50-50% mLAD, otherwise calcification but no obstruction in LCx/RCA.. Medical management recommended. b. 2017: low-risk NST.   Chronic diastolic CHF (congestive heart failure) ([name])    Chronic respiratory failure ([name])    CKD (chronic kidney disease), stage III ([name])    COPD (chronic obstructive pulmonary disease) ([name])    Cor pulmonale ([name])    a. felt due to advanced COPD and noncompliance with O2.   Depression    Diabetes mellitus    januvia    dx  2008   Fracture    left foot   Hx of seasonal allergies    Hypercholesteremia    Hypertension    on no medications   Lung cancer ([place])    On home oxygen therapy    "2.5L; 24/7" (03/10/2017)   Pericardial effusion    a. small-moderate in 07/2016.   Pulmonary hypertension ([name])    UTI (urinary tract infection)     ALLERGIES:  is allergic to amoxicillin.  MEDICATIONS:  Current Outpatient Medications  Medication Sig Dispense Refill   acetaminophen (TYLENOL) 325 MG tablet Take 650 mg by mouth every 6 (six) hours as needed for moderate pain or fever.      atorvastatin (LIPITOR) 40 MG tablet Take 1 tablet (40 mg total) by mouth daily. 90 tablet 3   azelastine (ASTELIN) 0.1 % nasal spray Place 1  spray into both nostrils 2 (two) times daily. Use in each nostril as directed 30 mL 6   citalopram (CELEXA) 10 MG tablet TAKE 1 TABLET BY MOUTH EVERY DAY 30 tablet 0   diclofenac sodium (VOLTAREN) 1 % GEL Apply 2 g topically 4 (four) times daily. 3 Tube 1   docusate sodium (COLACE) 100 MG capsule Take 1 capsule (100 mg total) by mouth 2 (two) times daily. 60 capsule 0   furosemide (LASIX) 40 MG tablet Take 1.5 tablets (60 mg total) by mouth 2 (two) times daily. 270 tablet 3   gabapentin (NEURONTIN)  300 MG capsule Take 1 capsule (300 mg total) by mouth at bedtime.     Insulin Detemir (LEVEMIR FLEXTOUCH) 100 UNIT/ML Pen Inject 30 Units into the skin at bedtime. 15 mL 11   ipratropium-albuterol (DUONEB) 0.5-2.5 (3) MG/3ML SOLN Take 3 mLs by nebulization every 4 (four) hours as needed (wheezing, Shortness of breath). 360 mL 0   iron polysaccharides (NIFEREX) 150 MG capsule Take 1 capsule (150 mg total) by mouth 2 (two) times daily. 60 capsule 1   macitentan (OPSUMIT) 10 MG tablet Take 1 tablet (10 mg total) by mouth daily. 30 tablet 11   midodrine (PROAMATINE) 5 MG tablet Take 1 tablet (5 mg total) by mouth 3 (three) times daily with meals. 90 tablet 0   mometasone-formoterol (DULERA) 200-5 MCG/ACT AERO Inhale 2 puffs into the lungs 2 (two) times daily. 13 g 1   OXYGEN Inhale 3 L into the lungs continuous. FOR COPD     polyethylene glycol (MIRALAX / GLYCOLAX) 17 g packet Take 17 g by mouth daily as needed for mild constipation.  0   Selexipag (UPTRAVI) 200 MCG TABS Take 2 tablets (400 mcg total) by mouth 2 (two) times a day.     sodium chloride (OCEAN) 0.65 % SOLN nasal spray Place 1 spray into both nostrils as needed for congestion.  0   TRULICITY 1.5 OM/6.0OK SOPN Inject 1.5 mg into the vein once a week.      No current facility-administered medications for this visit.    Facility-Administered Medications Ordered in Other Visits  Medication Dose Route Frequency Provider Last Rate Last Dose   diatrizoate meglumine-sodium (GASTROGRAFIN) 66-10 % solution 30 mL  30 mL Oral PRN Bjorn Loser, MD   30 mL at 11/30/15 0820    SURGICAL HISTORY:  Past Surgical History:  Procedure Laterality Date   APPLICATION OF WOUND VAC Right 11/23/2018   Procedure: Application Of Wound Vac;  Surgeon: Newt Minion, MD;  Location: [facility];  Service: Orthopedics;  Laterality: Right;   CARDIAC CATHETERIZATION     1995 or 96  [facility] in [place]     bilateral cataracts    LEFT HEART CATHETERIZATION WITH CORONARY ANGIOGRAM N/A 07/12/2014   Procedure: LEFT HEART CATHETERIZATION WITH CORONARY ANGIOGRAM;  Surgeon: [name], MD;  Location: [facility] C[facility] LAB;  Service: Cardiovascular;  Laterality: N/A;   MAXIMUM ACCESS (MAS)POSTERIOR LUMBAR INTERBODY FUSION (PLIF) 2 LEVEL N/A 02/22/2016   Procedure: Lumbar three-four - Lumbar four-five  MAXIMUM ACCESS SURGERY  POSTERIOR LUMBAR INTERBODY FUSION;  Surgeon: [name], MD;  Location: [facility] NEURO ORS;  Service: Neurosurgery;  Laterality: N/A;   ORIF ANKLE FRACTURE Right 11/23/2018   Procedure: OPEN REDUCTION INTERNAL FIXATION (ORIF) RIGHT ANKLE FRACTURE;  Surgeon: Newt Minion, MD;  Location: [facility];  Service: Orthopedics;  Laterality: Right;   ORIF TOE FRACTURE Left 06/12/2017  Procedure: OPEN REDUCTION INTERNAL FIXATION (ORIF) BASE 1ST METATARSAL (TOE) FRACTURE;  Surgeon: Newt Minion, MD;  Location: [facility];  Service: Orthopedics;  Laterality: Left;   RIGHT HEART CATH N/A 08/06/2017   Procedure: RIGHT HEART CATH;  Surgeon: Larey Dresser, MD;  Location: [facility] CV LAB;  Service: Cardiovascular;  Laterality: N/A;   THORACOTOMY Right 2010   lower   TONSILLECTOMY      REVIEW OF SYSTEMS:  A comprehensive review of systems was negative except for: Ears, nose, mouth, throat, and face: positive for hoarseness Respiratory: positive for dyspnea on exertion   LABORATORY DATA: Lab Results  Component Value Date   WBC 7.8 01/04/2019   HGB 10.0 (L) 01/04/2019   HCT 33.5 (L) 01/04/2019   MCV 82.7 01/04/2019   PLT 188 01/04/2019      Chemistry      Component Value Date/Time   NA 143 01/04/2019 1002   NA 137 04/02/2017 1535   NA 136 11/30/2015 0758   K 4.2 01/04/2019 1002   K 4.2 11/30/2015 0758   CL 107 01/04/2019 1002   CL 104 08/30/2012 0908   CO2 29 01/04/2019 1002   CO2 27 11/30/2015 0758   BUN 35 (H) 01/04/2019 1002   BUN 27 04/02/2017 1535   BUN 19.0 11/30/2015 0758   CREATININE 1.06 (H)  01/04/2019 1002   CREATININE 0.77 03/28/2016 1504   CREATININE 1.2 (H) 11/30/2015 0758      Component Value Date/Time   CALCIUM 9.3 01/04/2019 1002   CALCIUM 10.3 11/30/2015 0758   ALKPHOS 162 (H) 01/04/2019 1002   ALKPHOS 112 11/30/2015 0758   AST 19 01/04/2019 1002   AST 13 11/30/2015 0758   ALT 9 01/04/2019 1002   ALT 16 11/30/2015 0758   BILITOT 0.2 (L) 01/04/2019 1002   BILITOT 0.39 11/30/2015 0758       RADIOGRAPHIC STUDIES: Ct Chest W Contrast  Result Date: 01/04/2019 CLINICAL DATA:  Lung cancer follow-up. EXAM: CT CHEST WITH CONTRAST TECHNIQUE: Multidetector CT imaging of the chest was performed during intravenous contrast administration. CONTRAST:  92m OMNIPAQUE IOHEXOL 300 MG/ML  SOLN COMPARISON:  08/18/2018 FINDINGS: Cardiovascular: Normal heart size. Aortic atherosclerosis. Three vessel and left main coronary artery atherosclerotic calcifications. Mediastinum/Nodes: Normal appearance of the thyroid gland. The trachea appears patent and is midline. Normal appearance of the esophagus. No enlarged axillary or supraclavicular lymph nodes. Index subcarinal lymph node measures 2 cm, image 63/2. Unchanged. AP window lymph node measures 1.2 cm, image 40/2. Previously 0.9 cm. Left anterior pre-vascular lymph node measures 1.3 cm, image 41/2. Previously 0.8 cm. 0.9 cm right paratracheal lymph node is identified. Previously 0.7 cm. Low right paratracheal lymph node measures 1 cm. Previously 0.6 cm. Lungs/Pleura: No pleural effusion identified. Emphysema. Scar like density identified within the anterior left upper lobe, image 47/7. New from previous exam. Mild progression of subsegmental atelectasis involving the lingula. Stable postsurgical change within the right midlung without findings to suggest local tumor recurrence is. Stable subpleural nodule within the anterior right upper lobe measuring 3 mm, image 38/7. Upper Abdomen: No acute abnormality. Musculoskeletal: No chest wall  abnormality. No acute or significant osseous findings. IMPRESSION: 1. Stable appearance of the right lung status post wedge resection. No suspicious pulmonary nodule or mass noted to suggest local tumor recurrence. 2. The new scarring in the left upper lobe and progressive subsegmental atelectasis within the lingula. 3. Enlarged subcarinal lymph node is not significantly changed when compared with the previous exam. 4.  Mild interval increase in size of right paratracheal, left AP window, and left anterior prevascular lymph nodes. As detailed above. This is a nonspecific finding and in the setting of recent inflammation or infection may be reactive. However, early nodal metastasis cannot be excluded. Consider short-term interval follow-up to ensure stability. 5. Aortic Atherosclerosis (ICD10-I70.0) and Emphysema (ICD10-J43.9). 6. Three vessel and left main coronary artery atherosclerotic calcifications. Electronically Signed   By: Kerby Moors M.D.   On: 01/04/2019 14:18   Xr Ankle Complete Right  Result Date: 01/10/2019 Radiographs of right ankle show no change in alignment of displaced fracture and displaced internal fixation.   Xr Ankle Complete Right  Result Date: 12/27/2018 3 view radiographs of the right ankle shows no further migration of the fixation and stable displacement of fracture fragments.   ASSESSMENT AND PLAN: This is a very pleasant 73 years old white female with a stage IIB non-small cell lung cancer mixed with a small cell carcinoma. She status post right lower lobe superior segmentectomy with lymph node dissection followed by adjuvant systemic chemotherapy. The patient has been observation since April 2010. The patient is feeling fine today with no concerning complaints except for the baseline shortness of breath and hoarseness of her voice. She had a repeat CT scan of the chest performed recently.  I personally and independently reviewed the scans and discussed the results with  the patient today. HIDA scan showed no concerning findings for disease progression but there was mild interval increase and some of the mediastinal lymphadenopathy and these are nonspecific findings in the setting of the recent inflammation and likely reactive. I recommended for the patient to continue on observation with repeat CT scan of the chest in 6 months. The patient was advised to call immediately if she has any concerning symptoms in the interval. I discussed the assessment and treatment plan with the patient. The patient was provided an opportunity to ask questions and all were answered. The patient agreed with the plan and demonstrated an understanding of the instructions.   The patient was advised to call back or seek an in-person evaluation if the symptoms worsen or if the condition fails to improve as anticipated.  I provided 11 minutes of non face-to-face telephone visit time during this encounter, and > 50% was spent counseling as documented under my assessment & plan.  Eilleen Kempf, MD 01/20/2019 1:36 PM  Disclaimer: This note was dictated with voice recognition software. Similar sounding words can inadvertently be transcribed and may not be corrected upon review.

## 2019-01-24 ENCOUNTER — Encounter: Payer: Self-pay | Admitting: Physician Assistant

## 2019-01-24 ENCOUNTER — Other Ambulatory Visit: Payer: Self-pay

## 2019-01-24 ENCOUNTER — Ambulatory Visit (INDEPENDENT_AMBULATORY_CARE_PROVIDER_SITE_OTHER): Payer: Medicare Other | Admitting: Physician Assistant

## 2019-01-24 ENCOUNTER — Ambulatory Visit (INDEPENDENT_AMBULATORY_CARE_PROVIDER_SITE_OTHER): Payer: Medicare Other

## 2019-01-24 VITALS — Ht 62.0 in | Wt 174.4 lb

## 2019-01-24 DIAGNOSIS — E118 Type 2 diabetes mellitus with unspecified complications: Secondary | ICD-10-CM

## 2019-01-24 DIAGNOSIS — S82891P Other fracture of right lower leg, subsequent encounter for closed fracture with malunion: Secondary | ICD-10-CM | POA: Diagnosis not present

## 2019-01-24 DIAGNOSIS — E440 Moderate protein-calorie malnutrition: Secondary | ICD-10-CM

## 2019-01-24 DIAGNOSIS — J9611 Chronic respiratory failure with hypoxia: Secondary | ICD-10-CM

## 2019-01-24 DIAGNOSIS — Z794 Long term (current) use of insulin: Secondary | ICD-10-CM

## 2019-01-24 DIAGNOSIS — S82851S Displaced trimalleolar fracture of right lower leg, sequela: Secondary | ICD-10-CM

## 2019-01-24 MED ORDER — DOXYCYCLINE HYCLATE 100 MG PO CAPS: 100.0000 mg | ORAL_CAPSULE | Freq: Two times a day (BID) | ORAL | 1 refills | Status: DC

## 2019-01-24 NOTE — Progress Notes (Signed)
Office Visit Note   Patient: Amy Shepard           Date of Birth: 12/22/1945           MRN: [id number] Visit Date: 01/24/2019              Requested by: Maurice Small, MD [address] [place],  [place] [address] PCP: Maurice Small, MD  Chief Complaint  Patient presents with  . Right Ankle - Routine Post Op    11/23/2018 ORIF right ankle fx      HPI: The patient is a 73 year old woman who is seen for postoperative follow-up following open reduction internal fixation of a trimalleolar ankle fracture on 11/23/2018.  She was advanced to weightbearing as tolerated in a fracture boot with her walker.  She reports no pain over the area but has noted some very minimal crusting and clear drainage over the medial incision.  She is working with [facility] PT.  She did have a fall the other day but reported no injury and did not want to go to the hospital for head and neck evaluation.  She reports no pain over these areas today.  Assessment & Plan: Visit Diagnoses:  1. Closed fracture of right ankle with malunion, subsequent encounter   2. Chronic respiratory failure with hypoxia ([name])   3. Type 2 diabetes mellitus with complication, with long-term current use of insulin ([place])   4. Moderate protein malnutrition ([place])   5. Trimalleolar fracture of ankle, closed, right, sequela     Plan: She can continue walking with her fracture boot weightbearing as tolerated with a walker.  Will start doxycycline 100 mg p.o. twice daily for the scant drainage and some slight crusting over the medial incision. She will follow-up in 2 weeks with radiographs at that time.  Follow-Up Instructions: Return in about 2 weeks (around 02/07/2019).   Ortho Exam  Patient is alert, oriented, no adenopathy, well-dressed, normal affect, normal respiratory effort. The right medial ankle incision has a small amount of crusting over it and very slight erythema.  She is nontender to  palpation.  She has palpable pedal pulses. Dry dressing was applied over the incision medial incisional area and then Ace wrapping and fracture boot.  The hardware is palpable over the right lateral ankle area but there is no skin breakdown or signs of cellulitis or infection here. Imaging: Xr Ankle Complete Right  Result Date: 01/24/2019 Radiographs of right ankle show minimal change in alignment of displaced fracture and displaced internal fixation.   No images are attached to the encounter.  Labs: Lab Results  Component Value Date   HGBA1C 7.2 (H) 04/11/2018   HGBA1C 14.4 (H) 05/02/2017   HGBA1C 13.6 (H) 03/11/2017   ESRSEDRATE 83 (H) 04/13/2018   ESRSEDRATE 37 (H) 08/13/2016   CRP 0.3 (L) 09/11/2016   REPTSTATUS 03/14/2018 FINAL 03/11/2018   CULT >=100,000 COLONIES/mL ESCHERICHIA COLI (A) 03/11/2018   LABORGA ESCHERICHIA COLI (A) 03/11/2018     Lab Results  Component Value Date   ALBUMIN 3.1 (L) 01/04/2019   ALBUMIN 2.8 (L) 12/02/2018   ALBUMIN 3.1 (L) 11/25/2018    Lab Results  Component Value Date   MG 2.2 12/11/2018   MG 2.1 12/01/2018   MG 2.0 11/26/2018   No results found for: VD25OH  No results found for: PREALBUMIN CBC EXTENDED Latest Ref Rng & Units 01/04/2019 12/13/2018 12/06/2018  WBC 4.0 - 10.5 K/uL 7.8 6.9 10.1  RBC 3.87 - 5.11 MIL/uL 4.05 3.95 3.66(L)  HGB 12.0 - 15.0 g/dL 10.0(L) 9.6(L) 8.8(L)  HCT 36.0 - 46.0 % 33.5(L) 32.8(L) 29.5(L)  PLT 150 - 400 K/uL 188 243 280  NEUTROABS 1.7 - 7.7 K/uL 5.8 - -  LYMPHSABS 0.7 - 4.0 K/uL 0.9 - -     Body mass index is 31.9 kg/m.  Orders:  Orders Placed This Encounter  Procedures  . XR Ankle Complete Right   Meds ordered this encounter  Medications  . doxycycline (VIBRAMYCIN) 100 MG capsule    Sig: Take 1 capsule (100 mg total) by mouth 2 (two) times daily.    Dispense:  28 capsule    Refill:  1     Procedures: No procedures performed  Clinical Data: No additional findings.  ROS:  All other  systems negative, except as noted in the HPI. Review of Systems  Objective: Vital Signs: Ht _0  (1.575 m)   Wt 174 lb 6.4 oz (79.1 kg)   BMI 31.90 kg/m   Specialty Comments:  No specialty comments available.  PMFS History: Patient Active Problem List   Diagnosis Date Noted  . SOB (shortness of breath)   . Acute on chronic systolic (congestive) heart failure ([place])   . 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 Diabetes mellitus type 2 in obese ([place])   . Supplemental oxygen dependent   . Trimalleolar fracture of ankle, closed, right, sequela 12/01/2018  . Hypoxia   . Acute on chronic anemia   . Closed dislocation of right talus   . Acute on chronic postoperative respiratory failure ([place])   . Postprocedural hypotension   . Trimalleolar fracture of ankle, closed, right, initial encounter   . Syndesmotic disruption of ankle, right, initial encounter   . Ankle fracture 11/22/2018  . Pain in joint, ankle and foot 04/10/2018  . Closed left fibular fracture 04/10/2018  . CKD (chronic kidney disease), stage IV ([place]) 04/09/2018  . Near syncope 04/09/2018  . Dyspnea   . Bronchitis, acute 03/11/2018  . Fatigue 03/11/2018  . Anemia due to stage 4 chronic kidney disease ([place]) 03/11/2018  . Pulmonary fibrosis ([place]) 03/11/2018  . Pulmonary HTN ([place]) 03/11/2018  . Anemia 03/11/2018  . Lisfranc dislocation, left, subsequent encounter 06/08/2017  . Chronic respiratory failure with hypoxia ([place]) 06/03/2017  . Falls 06/03/2017  . Hypokalemia 06/03/2017  . Sepsis secondary to UTI ([place]) 06/02/2017  . Dog bite 05/02/2017  . CHF exacerbation ([place]) 05/02/2017  . Acute diastolic CHF (congestive heart failure) ([place]) 05/02/2017  . Chronic diastolic CHF (congestive heart failure) ([place]) 04/02/2017  . Cor pulmonale ([place]) 04/02/2017  . Acute on chronic respiratory failure ([place]) 03/10/2017  . Chronic pain 03/10/2017  . Lactic acidosis  02/10/2017  . Acute respiratory failure with hypoxia ([place]) 02/09/2017  . Acute on chronic diastolic CHF (congestive heart failure) ([place]) 06/23/2016  . HTN (hypertension) 06/23/2016  . Type 2 diabetes mellitus with complication, with long-term current use of insulin ([place]) 03/11/2016  . Depression 03/11/2016  . Chronic obstructive pulmonary disease ([place]) 03/10/2016  . S/P lumbar spinal fusion 02/22/2016  . Coronary artery disease involving native coronary artery 07/12/2014  . Pain in the chest   . Malignant neoplasm of lower lobe of right lung ([place]) 05/25/2014  . Lung cancer ([place]) 09/01/2012   Past Medical History:  Diagnosis Date  . Arthritis   . CAD (coronary artery disease)  a. Prior cath 2015 showed 40% prox AD, 50-50% mLAD, otherwise calcification but no obstruction in LCx/RCA.. Medical management recommended. b. 2017: low-risk NST.  [name] Chronic diastolic CHF (congestive heart failure) ([place])   . Chronic respiratory failure ([place])   . CKD (chronic kidney disease), stage III ([place])   . COPD (chronic obstructive pulmonary disease) ([place])   . Cor pulmonale ([name])    a. felt due to advanced COPD and noncompliance with O2.  . Depression   . Diabetes mellitus    [name]    dx  2008  . Fracture    left foot  . Hx of seasonal allergies   . Hypercholesteremia   . Hypertension    on no medications  . Lung cancer ([place])   . On home oxygen therapy    "2.5L; 24/7" (03/10/2017)  . Pericardial effusion    a. small-moderate in 07/2016.  . Pulmonary hypertension ([place])   . UTI (urinary tract infection)     Family History  Problem Relation Age of Onset  . Diabetes Father   . Heart failure Father   . Cancer Sister   . Diabetes Brother   . Heart failure Brother     Past Surgical History:  Procedure Laterality Date  . APPLICATION OF WOUND VAC Right 11/23/2018   Procedure: Application Of Wound Vac;  Surgeon: Newt Minion, MD;  Location: [facility];  Service: Orthopedics;  Laterality: Right;  .  [facility] in [place]     bilateral cataracts  . LEFT HEART CATHETERIZATION WITH CORONARY ANGIOGRAM N/A 07/12/2014   Procedure: LEFT HEART CATHETERIZATION WITH CORONARY ANGIOGRAM;  Surgeon: [name], MD;  [facility] LAB;  Service: Cardiovascular;  Laterality: N/A;  . MAXIMUM ACCESS (MAS)POSTERIOR LUMBAR INTERBODY FUSION (PLIF) 2 LEVEL N/A 02/22/2016   Procedure: Lumbar three-four - Lumbar four-five  MAXIMUM ACCESS SURGERY  POSTERIOR LUMBAR INTERBODY FUSION;  Surgeon: [name], MD;  Location: [facility] NEURO ORS;  Service: Neurosurgery;  Laterality: N/A;  . ORIF ANKLE FRACTURE Right 11/23/2018   Procedure: OPEN REDUCTION INTERNAL FIXATION (ORIF) RIGHT ANKLE FRACTURE;  Surgeon: Newt Minion, MD;  Location: [facility];  Service: Orthopedics;  Laterality: Right;  . ORIF TOE FRACTURE Left 06/12/2017   Procedure: OPEN REDUCTION INTERNAL FIXATION (ORIF) BASE 1ST METATARSAL (TOE) FRACTURE;  Surgeon: Newt Minion, MD;  Location: [facility];  Service: Orthopedics;  Laterality: Left;  . RIGHT HEART CATH N/A 08/06/2017   Procedure: RIGHT HEART CATH;  Surgeon: Larey Dresser, MD;  Location: [facility] CV LAB;  Service: Cardiovascular;  Laterality: N/A;  . THORACOTOMY Right 2010   lower  . TONSILLECTOMY     Social History   Occupational History  . Not on file  Tobacco Use  . Smoking status: Former Smoker    Packs/day: 1.00    Years: 30.00    Pack years: 30.00    Quit date: 07/14/2006    Years since quitting: 12.5  . Smokeless tobacco: Never Used  Substance and Sexual Activity  . Alcohol use: No  . Drug use: No  . Sexual activity: Not on file

## 2019-01-25 ENCOUNTER — Other Ambulatory Visit (HOSPITAL_COMMUNITY): Payer: Medicare Other

## 2019-01-31 ENCOUNTER — Other Ambulatory Visit: Payer: Self-pay | Admitting: Physical Medicine & Rehabilitation

## 2019-02-04 ENCOUNTER — Other Ambulatory Visit (HOSPITAL_COMMUNITY): Payer: Self-pay

## 2019-02-07 ENCOUNTER — Ambulatory Visit: Payer: Medicare Other | Admitting: Physician Assistant

## 2019-02-07 ENCOUNTER — Ambulatory Visit: Payer: Self-pay

## 2019-02-07 ENCOUNTER — Ambulatory Visit (INDEPENDENT_AMBULATORY_CARE_PROVIDER_SITE_OTHER): Payer: Medicare Other | Admitting: Orthopedic Surgery

## 2019-02-07 ENCOUNTER — Other Ambulatory Visit (HOSPITAL_COMMUNITY): Payer: Self-pay

## 2019-02-07 ENCOUNTER — Encounter: Payer: Self-pay | Admitting: Physician Assistant

## 2019-02-07 VITALS — Ht 62.0 in | Wt 174.0 lb

## 2019-02-07 DIAGNOSIS — S82891P Other fracture of right lower leg, subsequent encounter for closed fracture with malunion: Secondary | ICD-10-CM | POA: Diagnosis not present

## 2019-02-07 DIAGNOSIS — T85848A Pain due to other internal prosthetic devices, implants and grafts, initial encounter: Secondary | ICD-10-CM | POA: Diagnosis not present

## 2019-02-07 DIAGNOSIS — M14671 Charcot's joint, right ankle and foot: Secondary | ICD-10-CM

## 2019-02-07 MED ORDER — UPTRAVI 200 MCG PO TABS: 400.0000 ug | ORAL_TABLET | Freq: Two times a day (BID) | ORAL | Status: DC

## 2019-02-07 NOTE — Progress Notes (Signed)
Office Visit Note   Patient: Amy Shepard           Date of Birth: July 05, 1946           MRN: [id number] Visit Date: 02/07/2019              Requested by: Maurice Small, MD [place],  [place] [address] PCP: Maurice Small, MD  Chief Complaint  Patient presents with  . Right Ankle - Follow-up    11/23/2018 ORIF Right Ankle Trimalleolar Fracture      HPI: Patient is a 73 year old woman smoker on nasal cannula FiO2 diabetic who is 10 weeks status post trimalleolar right ankle fracture with syndesmotic disruption as well.  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  Patient states she lives alone she was unable to ambulate with protected weightbearing and has been full weightbearing on the fracture boot with retained hardware.  Assessment & Plan: Visit Diagnoses:  1. Closed fracture of right ankle with malunion, subsequent encounter   2. Charcot ankle, right   3. Pain from implanted hardware, initial encounter     Plan: Due to the fact that her Charcot collapse of the ankle is unstable we will need to pursue ankle fusion with removal deep retained hardware anterior ankle fusion.  Discussed the recommendation to proceed with inpatient rehab versus outpatient rehab after surgery.  Would plan for surgery on Friday with transfer to rehab early in the week.  Risks and benefits were discussed including infection neurovascular injury nonhealing of the skin nonhealing of the bone need for additional surgery.  Patient states she understands wished to proceed at this time.  Follow-Up Instructions: Return in about 2 weeks (around 02/21/2019).   Ortho Exam  Patient is alert, oriented, no adenopathy, well-dressed, normal affect, normal respiratory effort. Examination patient has a palpable dorsalis pedis and posterior tibial pulse  she does have swelling the hardware is prominent but there is no skin breakdown no cellulitis no drainage no signs of infection.  Ankle joint is unstable with range of motion.  Imaging: Xr Ankle Complete Right  Result Date: 02/07/2019 3 view radiographs of the right ankle shows Charcot collapse of the right ankle joint with failure of the internal fixation with a non-congruent tibiotalar joint.  No images are attached to the encounter.  Labs: Lab Results  Component Value Date   HGBA1C 7.2 (H) 04/11/2018   HGBA1C 14.4 (H) 05/02/2017   HGBA1C 13.6 (H) 03/11/2017   ESRSEDRATE 83 (H) 04/13/2018   ESRSEDRATE 37 (H) 08/13/2016   CRP 0.3 (L) 09/11/2016   REPTSTATUS 03/14/2018 FINAL 03/11/2018   CULT >=100,000 COLONIES/mL ESCHERICHIA COLI (A) 03/11/2018   LABORGA ESCHERICHIA COLI (A) 03/11/2018     Lab Results  Component Value Date   ALBUMIN 3.1 (L) 01/04/2019   ALBUMIN 2.8 (L) 12/02/2018   ALBUMIN 3.1 (L) 11/25/2018    Lab Results  Component Value Date   MG 2.2 12/11/2018   MG 2.1 12/01/2018   MG 2.0 11/26/2018   No results found for: VD25OH  No results found for: PREALBUMIN CBC EXTENDED Latest Ref Rng & Units 01/04/2019 12/13/2018 12/06/2018  WBC 4.0 - 10.5 K/uL 7.8 6.9 10.1  RBC 3.87 - 5.11 MIL/uL 4.05 3.95 3.66(L)  HGB 12.0 - 15.0 g/dL 10.0(L) 9.6(L) 8.8(L)  HCT 36.0 - 46.0 % 33.5(L) 32.8(L) 29.5(L)  PLT 150 -  400 K/uL 188 243 280  NEUTROABS 1.7 - 7.7 K/uL 5.8 - -  LYMPHSABS 0.7 - 4.0 K/uL 0.9 - -     Body mass index is 31.83 kg/m.  Orders:  Orders Placed This Encounter  Procedures  . XR Ankle Complete Right   No orders of the defined types were placed in this encounter.    Procedures: No procedures performed  Clinical Data: No additional findings.  ROS:  All other systems negative, except as noted in the HPI. Review of Systems  Objective: Vital Signs: Ht 5' 2" (1.575 m)   Wt 174 lb (78.9 kg)   BMI 31.83 kg/m   Specialty Comments:  No specialty  comments available.  PMFS History: Patient Active Problem List   Diagnosis Date Noted  . SOB (shortness of breath)   . Acute on chronic systolic (congestive) heart failure ([place])   . Benign hypertensive heart and kidney disease with diastolic CHF, NYHA class II and CKD stage III ([place])   . Diabetes mellitus type 2 in obese ([place])   . Supplemental oxygen dependent   . Trimalleolar fracture of ankle, closed, right, sequela 12/01/2018  . Hypoxia   . Acute on chronic anemia   . Closed dislocation of right talus   . Acute on chronic postoperative respiratory failure ([place])   . Postprocedural hypotension   . Trimalleolar fracture of ankle, closed, right, initial encounter   . Syndesmotic disruption of ankle, right, initial encounter   . Ankle fracture 11/22/2018  . Pain in joint, ankle and foot 04/10/2018  . Closed left fibular fracture 04/10/2018  . CKD (chronic kidney disease), stage IV ([place]) 04/09/2018  . Near syncope 04/09/2018  . Dyspnea   . Bronchitis, acute 03/11/2018  . Fatigue 03/11/2018  . Anemia due to stage 4 chronic kidney disease ([place]) 03/11/2018  . Pulmonary fibrosis ([place]) 03/11/2018  . Pulmonary HTN ([place]) 03/11/2018  . Anemia 03/11/2018  . Lisfranc dislocation, left, subsequent encounter 06/08/2017  . Chronic respiratory failure with hypoxia ([place]) 06/03/2017  . Falls 06/03/2017  . Hypokalemia 06/03/2017  . Sepsis secondary to UTI ([place]) 06/02/2017  . Dog bite 05/02/2017  . CHF exacerbation ([place]) 05/02/2017  . Acute diastolic CHF (congestive heart failure) ([place]) 05/02/2017  . Chronic diastolic CHF (congestive heart failure) ([place]) 04/02/2017  . Cor pulmonale ([place]) 04/02/2017  . Acute on chronic respiratory failure ([place]) 03/10/2017  . Chronic pain 03/10/2017  . Lactic acidosis 02/10/2017  . Acute respiratory failure with hypoxia ([place]) 02/09/2017  . Acute on chronic diastolic CHF (congestive heart failure) ([place]) 06/23/2016  . HTN (hypertension) 06/23/2016  . Type 2  diabetes mellitus with complication, with long-term current use of insulin ([place]) 03/11/2016  . Depression 03/11/2016  . Chronic obstructive pulmonary disease ([place]) 03/10/2016  . S/P lumbar spinal fusion 02/22/2016  . Coronary artery disease involving native coronary artery 07/12/2014  . Pain in the chest   . Malignant neoplasm of lower lobe of right lung ([place]) 05/25/2014  . Lung cancer ([place]) 09/01/2012   Past Medical History:  Diagnosis Date  . Arthritis   . CAD (coronary artery disease)    a. Prior cath 2015 showed 40% prox AD, 50-50% mLAD, otherwise calcification but no obstruction in LCx/RCA.. Medical management recommended. b. 2017: low-risk NST.  [name] Chronic diastolic CHF (congestive heart failure) ([place])   . Chronic respiratory failure ([place])   . CKD (chronic kidney disease), stage III ([place])   . COPD (chronic obstructive pulmonary disease) ([place])   . Cor pulmonale (  [place])    a. felt due to advanced COPD and noncompliance with O2.  . Depression   . Diabetes mellitus    [name]    dx  2008  . Fracture    left foot  . Hx of seasonal allergies   . Hypercholesteremia   . Hypertension    on no medications  . Lung cancer ([place])   . On home oxygen therapy    "2.5L; 24/7" (03/10/2017)  . Pericardial effusion    a. small-moderate in 07/2016.  . Pulmonary hypertension ([place])   . UTI (urinary tract infection)     Family History  Problem Relation Age of Onset  . Diabetes Father   . Heart failure Father   . Cancer Sister   . Diabetes Brother   . Heart failure Brother     Past Surgical History:  Procedure Laterality Date  . APPLICATION OF WOUND VAC Right 11/23/2018   Procedure: Application Of Wound Vac;  Surgeon: Newt Minion, MD;  Location: [facility];  Service: Orthopedics;  Laterality: Right;  . [facility] or [facility] in [place]     bilateral cataracts  . LEFT HEART CATHETERIZATION WITH CORONARY ANGIOGRAM N/A 07/12/2014   Procedure:  LEFT HEART CATHETERIZATION WITH CORONARY ANGIOGRAM;  Surgeon: [name], MD;  Location: [facility] CATH LAB;  Service: Cardiovascular;  Laterality: N/A;  . MAXIMUM ACCESS (MAS)POSTERIOR LUMBAR INTERBODY FUSION (PLIF) 2 LEVEL N/A 02/22/2016   Procedure: Lumbar three-four - Lumbar four-five  MAXIMUM ACCESS SURGERY  POSTERIOR LUMBAR INTERBODY FUSION;  Surgeon: [name], MD;  Location: [facility] NEURO ORS;  Service: Neurosurgery;  Laterality: N/A;  . ORIF ANKLE FRACTURE Right 11/23/2018   Procedure: OPEN REDUCTION INTERNAL FIXATION (ORIF) RIGHT ANKLE FRACTURE;  Surgeon: Newt Minion, MD;  Location: [facility];  Service: Orthopedics;  Laterality: Right;  . ORIF TOE FRACTURE Left 06/12/2017   Procedure: OPEN REDUCTION INTERNAL FIXATION (ORIF) BASE 1ST METATARSAL (TOE) FRACTURE;  Surgeon: Newt Minion, MD;  Location: [facility];  Service: Orthopedics;  Laterality: Left;  . RIGHT HEART CATH N/A 08/06/2017   Procedure: RIGHT HEART CATH;  Surgeon: Larey Dresser, MD;  Location: [facility] CV LAB;  Service: Cardiovascular;  Laterality: N/A;  . THORACOTOMY Right 2010   lower  . TONSILLECTOMY     Social History   Occupational History  . Not on file  Tobacco Use  . Smoking status: Former Smoker    Packs/day: 1.00    Years: 30.00    Pack years: 30.00    Quit date: 07/14/2006    Years since quitting: 12.5  . Smokeless tobacco: Never Used  Substance and Sexual Activity  . Alcohol use: No  . Drug use: No  . Sexual activity: Not on file

## 2019-02-09 ENCOUNTER — Encounter (HOSPITAL_COMMUNITY)
Admission: RE | Admit: 2019-02-09 | Discharge: 2019-02-09 | Disposition: A | Discharge status: Home / Self Care | Payer: Medicare Other | Source: Ambulatory Visit | Attending: Orthopedic Surgery | Admitting: Orthopedic Surgery

## 2019-02-09 ENCOUNTER — Other Ambulatory Visit: Payer: Self-pay

## 2019-02-09 ENCOUNTER — Other Ambulatory Visit: Payer: Self-pay | Admitting: Family

## 2019-02-09 DIAGNOSIS — Z20828 Contact with and (suspected) exposure to other viral communicable diseases: Secondary | ICD-10-CM | POA: Insufficient documentation

## 2019-02-09 LAB — SARS CORONAVIRUS 2 (TAT 6-24 HRS): SARS Coronavirus 2: NEGATIVE

## 2019-02-10 ENCOUNTER — Other Ambulatory Visit (HOSPITAL_COMMUNITY): Payer: Self-pay

## 2019-02-10 ENCOUNTER — Encounter (HOSPITAL_COMMUNITY): Payer: Self-pay | Admitting: *Deleted

## 2019-02-10 ENCOUNTER — Telehealth: Payer: Self-pay | Admitting: Physician Assistant

## 2019-02-10 ENCOUNTER — Other Ambulatory Visit: Payer: Self-pay

## 2019-02-10 MED ORDER — VANCOMYCIN HCL IN DEXTROSE 1-5 GM/200ML-% IV SOLN: 1000.0000 mg | INTRAVENOUS | Status: DC

## 2019-02-10 MED ORDER — VANCOMYCIN HCL IN DEXTROSE 1-5 GM/200ML-% IV SOLN
1000.0000 mg | INTRAVENOUS | Status: AC
  Administered 2019-02-11: 1000 mg via INTRAVENOUS
  Filled 2019-02-10: qty 200

## 2019-02-10 NOTE — Progress Notes (Addendum)
Spoke with pt for pre-op call. Pt has hx of Pulmonary HTN and sees Dr. Aundra Dubin. Pt is a type 2 diabetic. Last A1C was 6.? 2 months ago per pt. Dr. Justin Mend is her PCP. Pt states her fasting blood sugar is usually between 130 - 150. Pt instructed to take 1/2 of her regular dose of Levemir Insulin tonight (will take 15 units). Instructed pt to check her blood sugar when she gets up in the AM. If blood sugar is >220 take 1/2 of usual correction dose of Humalog insulin. If blood sugar is 70 or below, treat with 1/2 cup of gingerale (pt doesn't have any juice) and recheck blood sugar 15 minutes after drinking juice.  Patient does not have anyone that can come and pick up a G2 for ERAS. Instructed pt to drink clear liquids after midnight until 4:30 AM, no food after midnight. She voiced understanding. See Karoline Caldwell, PA note.  Pt had Covid test done 7/28 and it is negative. Pt states she has been in quarantine since having the test done.    Coronavirus Screening  Have you experienced the following symptoms:  Cough NO Fever (>100.86F) NO  Runny nose NO Sore throat NO Difficulty breathing/shortness of breath  NO  Have you or a family member traveled in the last 14 days and where? NO  Patient reminded that hospital visitation restrictions are in effect and the importance of the restrictions.

## 2019-02-10 NOTE — Progress Notes (Signed)
Anesthesia Chart Review: SAME DAY WORKUP  Case: [id number] Date/Time: 02/11/19 0715   Procedures:      REMOVAL DEEP HARDWARE RIGHT ANKLE (Right )     RIGHT ANKLE FUSION (Right )   Anesthesia type: Regional   Pre-op diagnosis: Charcot Collapse Right Ankle with Failure Fixation   Location: [facility] OR ROOM 05 / [facility]   Surgeon: Newt Minion, MD      DISCUSSION: 73 year old female with history of CAD, CKD, COPD with chronic hypoxic hypercapnic respiratory failure, pulmonary HTN with cor pulmonale, G4WN, chronic diastolic CHF with pulmonary hypertension, Lung CA (s/p RLL superior subsectomy resection and adjuvant chemotherapy in 2010).  Complex history of pulm HTN, chronic resp failure, diastolic failure, and cor pulmonale. Per Cardiology notes: Pulmonary hypertension: Possible group 1 PH.  Serologies negative.  Restrictive PFTs but lung parenchyma looked relatively normal on 1/18 CTA chest.  Findings by PFTs and CT not consistent with emphysema. V/Q scan negative for chronic or acute PEs. Serologic workup was negative. RHC (1/19) showed moderate pulmonary arterial hypertension. Echo in 8/18 showed dilated and dysfunctional RV, similar in 11/18 and 9/19.  Echo in 0/27 showed PA systolic pressure still very high at 81 mmHg.  Sleep study showed nocturnal hypoxemia but not OSA.  She wears home oxygen.  She is currently on Opsumit and selexipag. Continue current dose of selexipag. Intolerant higher doses  Patient underwent ORIF right ankle on 11/26/18 with Dr. Sharol Given. Hospital course complicated by hypotension and somnolence requiring pressors. Treated with IV lasix and BIPAP. Opsumit and selexipeg were resumed. Patient went to inpatient rehab on 5/20. Required high flow oxygen 4-6 L and hypoxic with activity. Pulmonary consulted and recommended aggressive pulmonary hygiene, oxygen titration and IV lasix x1. Symptoms felt to be cardiogenic in nature and cardiology consulted. Dr. Mahalia Longest felt hypoxia d/t fluid  overload with weight up to 194. Given IV lasix x 2 days. Discharge weight 185.  Last seen by pulmonology 12/28/18. At that time she denied wheezing or any significant SOB. It was recommended she continue O2 3-5L to keep O2 >90%.  Last seen by cardiology 01/11/19. Per note "Today she returns for post hospital follow up. Overall feeling fine. Last week she started getting short of breath and leg edema. so she started taking lasix 40 mg twice a day. Remains SOB with exertion. Denies PND/Orthopnea. She has been wheel chair bound. Appetite ok. No fever or chills. Unable to weigh due to RLE boot. Taking all medications. Lives alone."  Per telephone encounter by Erlinda Hong, PA-C 02/10/19 "Spoke with Darrick Grinder, NP for HF team who spoke with Dr. Aundra Dubin concerning the patient's surgery planned for tomorrow. Patient felt to be at moderate to high risk, but okay to proceed with surgery tomorrow per Dr. Aundra Dubin. They will plan to follow post operatively."  She will need DOS eval by anesthesiology. Recent right ankle ORIF 11/23/18 was done with spinal anesthesia.   VS: There were no vitals taken for this visit.  PROVIDERS: Maurice Small, MD is PCP  [name], MD is Cardiologist  Marshell Garfinkel, MD is Pulmonologist  LABS: Will need DOS labs  Labs Reviewed - No data to display   IMAGES: CT Chest 01/04/19: IMPRESSION: 1. Stable appearance of the right lung status post wedge resection. No suspicious pulmonary nodule or mass noted to suggest local tumor recurrence. 2. The new scarring in the left upper lobe and progressive subsegmental atelectasis within the lingula. 3. Enlarged subcarinal lymph node is not significantly changed  when compared with the previous exam. 4. Mild interval increase in size of right paratracheal, left AP window, and left anterior prevascular lymph nodes. As detailed above. This is a nonspecific finding and in the setting of recent inflammation or infection may be  reactive. However, early nodal metastasis cannot be excluded. Consider short-term interval follow-up to ensure stability. 5. Aortic Atherosclerosis (ICD10-I70.0) and Emphysema (ICD10-J43.9). 6. Three vessel and left main coronary artery atherosclerotic calcifications.   EKG: 11/22/18: Sinus rhythm. Rate 71. RBBB and LAFB. Nonspecific ST and T wave abnormality  CV: TTE 04/11/19: - Left ventricle: The cavity size was normal. Wall thickness was   increased in a pattern of mild LVH. Systolic function was normal.   The estimated ejection fraction was in the range of 55% to 60%.   Wall motion was normal; there were no regional wall motion   abnormalities. Doppler parameters are consistent with abnormal   left ventricular relaxation (grade 1 diastolic dysfunction).   Doppler parameters are consistent with high ventricular filling   pressure. - Ventricular septum: The contour showed diastolic flattening and   systolic flattening. - Mitral valve: Calcified annulus. - Left atrium: The atrium was mildly dilated. - Right ventricle: The cavity size was mildly dilated. Systolic   function was moderately reduced. - Pulmonary arteries: Systolic pressure was severely increased. PA   peak pressure: 81 mm Hg (S).  Impressions:  - Normal LV systolic function; mild LVH; mild diastolic dysfunction   with elevated LV filling pressure; mild LAE; mild RVE with   moderate RV dysfunction; mild TR with severe pulmonary   hypertension.  Right heart cath 08/06/17: 1. Near-normal filling pressures.  2. Moderate pulmonary arterial hypertension with PVR 4.7 WU.  3. Preserved cardiac output.   Trial of pulmonary vasodilators would be reasonable.  Will start with Opsumit 10 mg daily.   Nuclear stress 02/07/16:  Nuclear stress EF: 76%. No wall motion abnormalities  There was no ST segment deviation noted during stress.  Defect 1: There is a small defect of mild severity present in the apex location. No  ischemia identified  This is a low risk study.     Past Medical History:  Diagnosis Date  . Arthritis   . CAD (coronary artery disease)    a. Prior cath 2015 showed 40% prox AD, 50-50% mLAD, otherwise calcification but no obstruction in LCx/RCA.. Medical management recommended. b. 2017: low-risk NST.  [name] Chronic diastolic CHF (congestive heart failure) ([place])   . Chronic respiratory failure ([place])   . CKD (chronic kidney disease), stage III ([place])   . COPD (chronic obstructive pulmonary disease) ([place])   . Cor pulmonale ([name])    a. felt due to advanced COPD and noncompliance with O2.  . Depression   . Diabetes mellitus    [name]    dx  2008  . Fracture    left foot  . Hx of seasonal allergies   . Hypercholesteremia   . Hypertension   . Lung cancer ([place])   . On home oxygen therapy    "2.5L; 24/7" (03/10/2017)  . Pericardial effusion    a. small-moderate in 07/2016.  . Pulmonary hypertension ([place])   . UTI (urinary tract infection)     Past Surgical History:  Procedure Laterality Date  . APPLICATION OF WOUND VAC Right 11/23/2018   Procedure: Application Of Wound Vac;  Surgeon: Newt Minion, MD;  Location: [facility];  Service: Orthopedics;  Laterality: Right;  . CARDIAC CATHETERIZATION  1995 or [facility] in [place]     bilateral cataracts  . LEFT HEART CATHETERIZATION WITH CORONARY ANGIOGRAM N/A 07/12/2014   Procedure: LEFT HEART CATHETERIZATION WITH CORONARY ANGIOGRAM;  Surgeon: [name], MD;  [facility] LAB;  Service: Cardiovascular;  Laterality: N/A;  . MAXIMUM ACCESS (MAS)POSTERIOR LUMBAR INTERBODY FUSION (PLIF) 2 LEVEL N/A 02/22/2016   Procedure: Lumbar three-four - Lumbar four-five  MAXIMUM ACCESS SURGERY  POSTERIOR LUMBAR INTERBODY FUSION;  Surgeon: [name], MD;  Location: [facility] NEURO ORS;  Service: Neurosurgery;  Laterality: N/A;  . ORIF ANKLE FRACTURE Right 11/23/2018   Procedure: OPEN REDUCTION INTERNAL FIXATION (ORIF) RIGHT  ANKLE FRACTURE;  Surgeon: Newt Minion, MD;  Location: [facility];  Service: Orthopedics;  Laterality: Right;  . ORIF TOE FRACTURE Left 06/12/2017   Procedure: OPEN REDUCTION INTERNAL FIXATION (ORIF) BASE 1ST METATARSAL (TOE) FRACTURE;  Surgeon: Newt Minion, MD;  Location: [facility];  Service: Orthopedics;  Laterality: Left;  . RIGHT HEART CATH N/A 08/06/2017   Procedure: RIGHT HEART CATH;  Surgeon: Larey Dresser, MD;  Location: [facility] CV LAB;  Service: Cardiovascular;  Laterality: N/A;  . THORACOTOMY Right 2010   lower  . TONSILLECTOMY      MEDICATIONS: . [START ON 02/11/2019] vancomycin (VANCOCIN) IVPB 1000 mg/200 mL premix   . acetaminophen (TYLENOL) 325 MG tablet  . atorvastatin (LIPITOR) 40 MG tablet  . azelastine (ASTELIN) 0.1 % nasal spray  . citalopram (CELEXA) 10 MG tablet  . diclofenac sodium (VOLTAREN) 1 % GEL  . docusate sodium (COLACE) 100 MG capsule  . doxycycline (VIBRAMYCIN) 100 MG capsule  . furosemide (LASIX) 40 MG tablet  . gabapentin (NEURONTIN) 300 MG capsule  . Insulin Detemir (LEVEMIR FLEXTOUCH) 100 UNIT/ML Pen  . ipratropium-albuterol (DUONEB) 0.5-2.5 (3) MG/3ML SOLN  . iron polysaccharides (NIFEREX) 150 MG capsule  . macitentan (OPSUMIT) 10 MG tablet  . midodrine (PROAMATINE) 5 MG tablet  . mometasone-formoterol (DULERA) 200-5 MCG/ACT AERO  . OXYGEN  . polyethylene glycol (MIRALAX / GLYCOLAX) 17 g packet  . Selexipag (UPTRAVI) 200 MCG TABS  . sodium chloride (OCEAN) 0.65 % SOLN nasal spray  . TRULICITY 1.5 NH/6.5BX SOPN   . diatrizoate meglumine-sodium (GASTROGRAFIN) 66-10 % solution 30 mL    [facility] Short Stay Center/Anesthesiology Phone [phone] 02/10/2019 3:49 PM

## 2019-02-10 NOTE — Anesthesia Preprocedure Evaluation (Addendum)
Anesthesia Evaluation  Patient identified by MRN, date of birth, ID band Patient awake    Reviewed: Allergy & Precautions, NPO status , Patient's Chart, lab work & pertinent test results  History of Anesthesia Complications (+) history of anesthetic complications  Airway Mallampati: II  TM Distance: >3 FB Neck ROM: Full    Dental  (+) Teeth Intact, Dental Advisory Given, Partial Upper   Pulmonary shortness of breath, COPD,  COPD inhaler and oxygen dependent, former smoker,  Lung cancer   Pulmonary exam normal breath sounds clear to auscultation       Cardiovascular hypertension, Pt. on medications + CAD and +CHF  Normal cardiovascular exam Rhythm:Regular Rate:Normal  Echo 04/11/2019: Impressions:  - Normal LV systolic function; mild LVH; mild diastolic dysfunction with elevated LV filling pressure; mild LAE; mild RVE with moderate RV dysfunction; mild TR with severe pulmonary hypertension.   Neuro/Psych PSYCHIATRIC DISORDERS Anxiety Depression negative neurological ROS     GI/Hepatic negative GI ROS, Neg liver ROS,   Endo/Other  diabetes, Type 2, Insulin DependentObesity   Renal/GU Renal Insufficiency and CRFRenal disease     Musculoskeletal  (+) Arthritis , Charcot Collapse Right Ankle with Failure Fixation   Abdominal   Peds  Hematology  (+) Blood dyscrasia, anemia ,   Anesthesia Other Findings Day of surgery medications reviewed with the patient.  Reproductive/Obstetrics                            Anesthesia Physical Anesthesia Plan  ASA: III  Anesthesia Plan: MAC   Post-op Pain Management:  Regional for Post-op pain   Induction: Intravenous  PONV Risk Score and Plan: 2 and Propofol infusion and Ondansetron  Airway Management Planned: LMA and Natural Airway  Additional Equipment:   Intra-op Plan:   Post-operative Plan: Extubation in OR  Informed Consent: I have  reviewed the patients History and Physical, chart, labs and discussed the procedure including the risks, benefits and alternatives for the proposed anesthesia with the patient or authorized representative who has indicated his/her understanding and acceptance.     Dental advisory given  Plan Discussed with: CRNA  Anesthesia Plan Comments:        Anesthesia Quick Evaluation

## 2019-02-10 NOTE — Telephone Encounter (Signed)
Spoke with Darrick Grinder, NP for HF team who spoke with Dr. Aundra Dubin concerning the patient's surgery planned for tomorrow. Patient felt to be at moderate to high risk, but okay to proceed with surgery tomorrow per Dr. Aundra Dubin. They will plan to follow post operatively.   [place] [phone]

## 2019-02-11 ENCOUNTER — Inpatient Hospital Stay (HOSPITAL_COMMUNITY): Payer: Medicare Other | Admitting: Physician Assistant

## 2019-02-11 ENCOUNTER — Inpatient Hospital Stay (HOSPITAL_COMMUNITY)
Admission: RE | Admit: 2019-02-11 | Discharge: 2019-03-11 | DRG: 492 | Disposition: A | Discharge status: Home Health | Payer: Medicare Other | Attending: Orthopedic Surgery | Admitting: Orthopedic Surgery

## 2019-02-11 ENCOUNTER — Encounter (HOSPITAL_COMMUNITY)
Admission: RE | Disposition: A | Discharge status: Home Health | Payer: Self-pay | Source: Home / Self Care | Attending: Orthopedic Surgery

## 2019-02-11 ENCOUNTER — Encounter (HOSPITAL_COMMUNITY): Payer: Self-pay

## 2019-02-11 DIAGNOSIS — Z833 Family history of diabetes mellitus: Secondary | ICD-10-CM

## 2019-02-11 DIAGNOSIS — J9622 Acute and chronic respiratory failure with hypercapnia: Secondary | ICD-10-CM | POA: Diagnosis not present

## 2019-02-11 DIAGNOSIS — Z809 Family history of malignant neoplasm, unspecified: Secondary | ICD-10-CM

## 2019-02-11 DIAGNOSIS — I5032 Chronic diastolic (congestive) heart failure: Secondary | ICD-10-CM | POA: Diagnosis present

## 2019-02-11 DIAGNOSIS — I50810 Right heart failure, unspecified: Secondary | ICD-10-CM | POA: Diagnosis present

## 2019-02-11 DIAGNOSIS — F329 Major depressive disorder, single episode, unspecified: Secondary | ICD-10-CM | POA: Diagnosis present

## 2019-02-11 DIAGNOSIS — I272 Pulmonary hypertension, unspecified: Secondary | ICD-10-CM | POA: Diagnosis not present

## 2019-02-11 DIAGNOSIS — F40240 Claustrophobia: Secondary | ICD-10-CM | POA: Diagnosis present

## 2019-02-11 DIAGNOSIS — J439 Emphysema, unspecified: Secondary | ICD-10-CM | POA: Diagnosis not present

## 2019-02-11 DIAGNOSIS — E1122 Type 2 diabetes mellitus with diabetic chronic kidney disease: Secondary | ICD-10-CM | POA: Diagnosis present

## 2019-02-11 DIAGNOSIS — J431 Panlobular emphysema: Secondary | ICD-10-CM | POA: Diagnosis not present

## 2019-02-11 DIAGNOSIS — Z880 Allergy status to penicillin: Secondary | ICD-10-CM

## 2019-02-11 DIAGNOSIS — I2721 Secondary pulmonary arterial hypertension: Secondary | ICD-10-CM | POA: Diagnosis present

## 2019-02-11 DIAGNOSIS — D509 Iron deficiency anemia, unspecified: Secondary | ICD-10-CM | POA: Diagnosis present

## 2019-02-11 DIAGNOSIS — I2781 Cor pulmonale (chronic): Secondary | ICD-10-CM | POA: Diagnosis not present

## 2019-02-11 DIAGNOSIS — E118 Type 2 diabetes mellitus with unspecified complications: Secondary | ICD-10-CM

## 2019-02-11 DIAGNOSIS — I5030 Unspecified diastolic (congestive) heart failure: Secondary | ICD-10-CM | POA: Diagnosis not present

## 2019-02-11 DIAGNOSIS — X58XXXD Exposure to other specified factors, subsequent encounter: Secondary | ICD-10-CM | POA: Diagnosis present

## 2019-02-11 DIAGNOSIS — E11649 Type 2 diabetes mellitus with hypoglycemia without coma: Secondary | ICD-10-CM | POA: Diagnosis not present

## 2019-02-11 DIAGNOSIS — R0902 Hypoxemia: Secondary | ICD-10-CM

## 2019-02-11 DIAGNOSIS — E785 Hyperlipidemia, unspecified: Secondary | ICD-10-CM | POA: Diagnosis present

## 2019-02-11 DIAGNOSIS — E876 Hypokalemia: Secondary | ICD-10-CM | POA: Diagnosis present

## 2019-02-11 DIAGNOSIS — T85848A Pain due to other internal prosthetic devices, implants and grafts, initial encounter: Secondary | ICD-10-CM

## 2019-02-11 DIAGNOSIS — J95822 Acute and chronic postprocedural respiratory failure: Secondary | ICD-10-CM | POA: Diagnosis not present

## 2019-02-11 DIAGNOSIS — M1712 Unilateral primary osteoarthritis, left knee: Secondary | ICD-10-CM | POA: Diagnosis present

## 2019-02-11 DIAGNOSIS — J449 Chronic obstructive pulmonary disease, unspecified: Secondary | ICD-10-CM | POA: Diagnosis present

## 2019-02-11 DIAGNOSIS — Z9981 Dependence on supplemental oxygen: Secondary | ICD-10-CM

## 2019-02-11 DIAGNOSIS — Z9119 Patient's noncompliance with other medical treatment and regimen: Secondary | ICD-10-CM

## 2019-02-11 DIAGNOSIS — J8410 Pulmonary fibrosis, unspecified: Secondary | ICD-10-CM | POA: Diagnosis not present

## 2019-02-11 DIAGNOSIS — I2510 Atherosclerotic heart disease of native coronary artery without angina pectoris: Secondary | ICD-10-CM | POA: Diagnosis present

## 2019-02-11 DIAGNOSIS — T84196A Other mechanical complication of internal fixation device of bone of right lower leg, initial encounter: Secondary | ICD-10-CM | POA: Diagnosis present

## 2019-02-11 DIAGNOSIS — I10 Essential (primary) hypertension: Secondary | ICD-10-CM | POA: Diagnosis present

## 2019-02-11 DIAGNOSIS — I5033 Acute on chronic diastolic (congestive) heart failure: Secondary | ICD-10-CM | POA: Diagnosis present

## 2019-02-11 DIAGNOSIS — S82851P Displaced trimalleolar fracture of right lower leg, subsequent encounter for closed fracture with malunion: Secondary | ICD-10-CM

## 2019-02-11 DIAGNOSIS — Z794 Long term (current) use of insulin: Secondary | ICD-10-CM | POA: Diagnosis not present

## 2019-02-11 DIAGNOSIS — Z993 Dependence on wheelchair: Secondary | ICD-10-CM | POA: Diagnosis not present

## 2019-02-11 DIAGNOSIS — Z6831 Body mass index (BMI) 31.0-31.9, adult: Secondary | ICD-10-CM

## 2019-02-11 DIAGNOSIS — E11610 Type 2 diabetes mellitus with diabetic neuropathic arthropathy: Secondary | ICD-10-CM | POA: Diagnosis present

## 2019-02-11 DIAGNOSIS — Z85118 Personal history of other malignant neoplasm of bronchus and lung: Secondary | ICD-10-CM

## 2019-02-11 DIAGNOSIS — Z20828 Contact with and (suspected) exposure to other viral communicable diseases: Secondary | ICD-10-CM | POA: Diagnosis present

## 2019-02-11 DIAGNOSIS — J441 Chronic obstructive pulmonary disease with (acute) exacerbation: Secondary | ICD-10-CM | POA: Diagnosis present

## 2019-02-11 DIAGNOSIS — I2609 Other pulmonary embolism with acute cor pulmonale: Secondary | ICD-10-CM | POA: Diagnosis present

## 2019-02-11 DIAGNOSIS — Z79899 Other long term (current) drug therapy: Secondary | ICD-10-CM

## 2019-02-11 DIAGNOSIS — Z8249 Family history of ischemic heart disease and other diseases of the circulatory system: Secondary | ICD-10-CM

## 2019-02-11 DIAGNOSIS — J984 Other disorders of lung: Secondary | ICD-10-CM | POA: Diagnosis not present

## 2019-02-11 DIAGNOSIS — D631 Anemia in chronic kidney disease: Secondary | ICD-10-CM | POA: Diagnosis present

## 2019-02-11 DIAGNOSIS — J9601 Acute respiratory failure with hypoxia: Secondary | ICD-10-CM | POA: Diagnosis not present

## 2019-02-11 DIAGNOSIS — R0602 Shortness of breath: Secondary | ICD-10-CM

## 2019-02-11 DIAGNOSIS — Z515 Encounter for palliative care: Secondary | ICD-10-CM | POA: Diagnosis not present

## 2019-02-11 DIAGNOSIS — Z9221 Personal history of antineoplastic chemotherapy: Secondary | ICD-10-CM

## 2019-02-11 DIAGNOSIS — Z87891 Personal history of nicotine dependence: Secondary | ICD-10-CM

## 2019-02-11 DIAGNOSIS — I5031 Acute diastolic (congestive) heart failure: Secondary | ICD-10-CM | POA: Diagnosis not present

## 2019-02-11 DIAGNOSIS — I130 Hypertensive heart and chronic kidney disease with heart failure and stage 1 through stage 4 chronic kidney disease, or unspecified chronic kidney disease: Secondary | ICD-10-CM | POA: Diagnosis present

## 2019-02-11 DIAGNOSIS — Z7189 Other specified counseling: Secondary | ICD-10-CM

## 2019-02-11 DIAGNOSIS — Y838 Other surgical procedures as the cause of abnormal reaction of the patient, or of later complication, without mention of misadventure at the time of the procedure: Secondary | ICD-10-CM | POA: Diagnosis present

## 2019-02-11 DIAGNOSIS — N183 Chronic kidney disease, stage 3 (moderate): Secondary | ICD-10-CM | POA: Diagnosis present

## 2019-02-11 DIAGNOSIS — J9621 Acute and chronic respiratory failure with hypoxia: Secondary | ICD-10-CM | POA: Diagnosis not present

## 2019-02-11 DIAGNOSIS — M25562 Pain in left knee: Secondary | ICD-10-CM

## 2019-02-11 DIAGNOSIS — E1142 Type 2 diabetes mellitus with diabetic polyneuropathy: Secondary | ICD-10-CM | POA: Diagnosis present

## 2019-02-11 DIAGNOSIS — Z87440 Personal history of urinary (tract) infections: Secondary | ICD-10-CM

## 2019-02-11 DIAGNOSIS — D638 Anemia in other chronic diseases classified elsewhere: Secondary | ICD-10-CM | POA: Diagnosis not present

## 2019-02-11 DIAGNOSIS — E1165 Type 2 diabetes mellitus with hyperglycemia: Secondary | ICD-10-CM | POA: Diagnosis present

## 2019-02-11 DIAGNOSIS — R911 Solitary pulmonary nodule: Secondary | ICD-10-CM | POA: Diagnosis present

## 2019-02-11 DIAGNOSIS — I270 Primary pulmonary hypertension: Secondary | ICD-10-CM | POA: Diagnosis not present

## 2019-02-11 DIAGNOSIS — J302 Other seasonal allergic rhinitis: Secondary | ICD-10-CM | POA: Diagnosis present

## 2019-02-11 DIAGNOSIS — I951 Orthostatic hypotension: Secondary | ICD-10-CM | POA: Diagnosis present

## 2019-02-11 DIAGNOSIS — M14671 Charcot's joint, right ankle and foot: Secondary | ICD-10-CM

## 2019-02-11 DIAGNOSIS — S82891P Other fracture of right lower leg, subsequent encounter for closed fracture with malunion: Secondary | ICD-10-CM | POA: Diagnosis present

## 2019-02-11 DIAGNOSIS — I361 Nonrheumatic tricuspid (valve) insufficiency: Secondary | ICD-10-CM | POA: Diagnosis not present

## 2019-02-11 DIAGNOSIS — E669 Obesity, unspecified: Secondary | ICD-10-CM | POA: Diagnosis present

## 2019-02-11 DIAGNOSIS — M1990 Unspecified osteoarthritis, unspecified site: Secondary | ICD-10-CM | POA: Diagnosis present

## 2019-02-11 HISTORY — DX: Other complications of anesthesia, initial encounter: T88.59XA

## 2019-02-11 HISTORY — DX: Anxiety disorder, unspecified: F41.9

## 2019-02-11 HISTORY — PX: ANKLE FUSION: SHX5718

## 2019-02-11 HISTORY — DX: Anemia, unspecified: D64.9

## 2019-02-11 HISTORY — PX: HARDWARE REMOVAL: SHX979

## 2019-02-11 LAB — BASIC METABOLIC PANEL
Anion gap: 12 (ref 5–15)
BUN: 53 mg/dL — ABNORMAL HIGH (ref 8–23)
CO2: 28 mmol/L (ref 22–32)
Calcium: 9.2 mg/dL (ref 8.9–10.3)
Chloride: 102 mmol/L (ref 98–111)
Creatinine, Ser: 1.51 mg/dL — ABNORMAL HIGH (ref 0.44–1.00)
GFR calc Af Amer: 40 mL/min — ABNORMAL LOW (ref >60)
GFR calc non Af Amer: 34 mL/min — ABNORMAL LOW (ref >60)
Glucose, Bld: 92 mg/dL (ref 70–99)
Potassium: 3.6 mmol/L (ref 3.5–5.1)
Sodium: 142 mmol/L (ref 135–145)

## 2019-02-11 LAB — HEMOGLOBIN A1C
Hgb A1c MFr Bld: 6.9 % — ABNORMAL HIGH (ref 4.8–5.6)
Mean Plasma Glucose: 151.33 mg/dL

## 2019-02-11 LAB — CBC
HCT: 32.7 % — ABNORMAL LOW (ref 36.0–46.0)
Hemoglobin: 10 g/dL — ABNORMAL LOW (ref 12.0–15.0)
MCH: 25.1 pg — ABNORMAL LOW (ref 26.0–34.0)
MCHC: 30.6 g/dL (ref 30.0–36.0)
MCV: 82 fL (ref 80.0–100.0)
Platelets: 214 10*3/uL (ref 150–400)
RBC: 3.99 MIL/uL (ref 3.87–5.11)
RDW: 18.8 % — ABNORMAL HIGH (ref 11.5–15.5)
WBC: 9.1 10*3/uL (ref 4.0–10.5)
nRBC: 0 % (ref 0.0–0.2)

## 2019-02-11 LAB — GLUCOSE, CAPILLARY
Glucose-Capillary: 81 mg/dL (ref 70–99)
Glucose-Capillary: 87 mg/dL (ref 70–99)
Glucose-Capillary: 126 mg/dL — ABNORMAL HIGH (ref 70–99)

## 2019-02-11 LAB — SURGICAL PCR SCREEN
MRSA, PCR: NEGATIVE
Staphylococcus aureus: NEGATIVE

## 2019-02-11 SURGERY — REMOVAL, HARDWARE
Anesthesia: Monitor Anesthesia Care | Laterality: Right

## 2019-02-11 MED ORDER — ONDANSETRON HCL 4 MG PO TABS
4.0000 mg | ORAL_TABLET | Freq: Four times a day (QID) | ORAL | Status: DC | PRN
  Administered 2019-02-20: 4 mg via ORAL
  Filled 2019-02-11: qty 1

## 2019-02-11 MED ORDER — EPHEDRINE 5 MG/ML INJ
INTRAVENOUS | Status: AC
  Filled 2019-02-11: qty 10

## 2019-02-11 MED ORDER — PROPOFOL 10 MG/ML IV BOLUS
INTRAVENOUS | Status: DC | PRN
  Administered 2019-02-11: 10 mg via INTRAVENOUS

## 2019-02-11 MED ORDER — GLYCOPYRROLATE 0.2 MG/ML IJ SOLN
INTRAMUSCULAR | Status: DC | PRN
  Administered 2019-02-11: 0.2 mg via INTRAVENOUS

## 2019-02-11 MED ORDER — STERILE WATER FOR IRRIGATION IR SOLN
Status: DC | PRN
  Administered 2019-02-11: 1000 mL

## 2019-02-11 MED ORDER — 0.9 % SODIUM CHLORIDE (POUR BTL) OPTIME
TOPICAL | Status: DC | PRN
  Administered 2019-02-11: 08:00:00 1000 mL

## 2019-02-11 MED ORDER — GABAPENTIN 300 MG PO CAPS
300.0000 mg | ORAL_CAPSULE | Freq: Three times a day (TID) | ORAL | Status: DC
  Administered 2019-02-11 – 2019-03-11 (×84): 300 mg via ORAL
  Filled 2019-02-11 (×85): qty 1

## 2019-02-11 MED ORDER — FENTANYL CITRATE (PF) 250 MCG/5ML IJ SOLN
INTRAMUSCULAR | Status: DC | PRN
  Administered 2019-02-11: 50 ug via INTRAVENOUS

## 2019-02-11 MED ORDER — POLYSACCHARIDE IRON COMPLEX 150 MG PO CAPS
150.0000 mg | ORAL_CAPSULE | Freq: Two times a day (BID) | ORAL | Status: DC
  Administered 2019-02-11 – 2019-03-11 (×55): 150 mg via ORAL
  Filled 2019-02-11 (×57): qty 1

## 2019-02-11 MED ORDER — ONDANSETRON HCL 4 MG/2ML IJ SOLN
INTRAMUSCULAR | Status: AC
  Filled 2019-02-11: qty 2

## 2019-02-11 MED ORDER — SERTRALINE HCL 50 MG PO TABS
75.0000 mg | ORAL_TABLET | Freq: Every day | ORAL | Status: DC
  Administered 2019-02-11 – 2019-03-11 (×29): 75 mg via ORAL
  Filled 2019-02-11 (×31): qty 1

## 2019-02-11 MED ORDER — FENTANYL CITRATE (PF) 250 MCG/5ML IJ SOLN
INTRAMUSCULAR | Status: AC
  Filled 2019-02-11: qty 5

## 2019-02-11 MED ORDER — BUPIVACAINE HCL (PF) 0.25 % IJ SOLN
INTRAMUSCULAR | Status: DC | PRN
  Administered 2019-02-11: 30 mL

## 2019-02-11 MED ORDER — SELEXIPAG 200 MCG PO TABS
600.0000 ug | ORAL_TABLET | Freq: Two times a day (BID) | ORAL | Status: DC
  Administered 2019-02-11 – 2019-03-05 (×44): 600 ug via ORAL
  Administered 2019-03-06: 400 ug via ORAL
  Filled 2019-02-11 (×19): qty 3
  Filled 2019-02-11: qty 2
  Filled 2019-02-11 (×32): qty 3

## 2019-02-11 MED ORDER — MOMETASONE FURO-FORMOTEROL FUM 200-5 MCG/ACT IN AERO
2.0000 | INHALATION_SPRAY | Freq: Two times a day (BID) | RESPIRATORY_TRACT | Status: DC
  Administered 2019-02-12 – 2019-02-21 (×18): 2 via RESPIRATORY_TRACT
  Filled 2019-02-11: qty 8.8

## 2019-02-11 MED ORDER — MACITENTAN 10 MG PO TABS
10.0000 mg | ORAL_TABLET | Freq: Every day | ORAL | Status: DC
  Administered 2019-02-11 – 2019-03-11 (×29): 10 mg via ORAL
  Filled 2019-02-11 (×29): qty 1

## 2019-02-11 MED ORDER — PROPOFOL 1000 MG/100ML IV EMUL
INTRAVENOUS | Status: AC
  Filled 2019-02-11: qty 200

## 2019-02-11 MED ORDER — TRAMADOL HCL 50 MG PO TABS
50.0000 mg | ORAL_TABLET | Freq: Four times a day (QID) | ORAL | Status: DC
  Administered 2019-02-11 – 2019-03-11 (×98): 50 mg via ORAL
  Filled 2019-02-11 (×104): qty 1

## 2019-02-11 MED ORDER — BUPIVACAINE HCL (PF) 0.25 % IJ SOLN
INTRAMUSCULAR | Status: AC
  Filled 2019-02-11: qty 30

## 2019-02-11 MED ORDER — AZELASTINE HCL 0.1 % NA SOLN
1.0000 | Freq: Two times a day (BID) | NASAL | Status: DC
  Administered 2019-02-11 – 2019-03-11 (×54): 1 via NASAL
  Filled 2019-02-11: qty 30

## 2019-02-11 MED ORDER — SALINE SPRAY 0.65 % NA SOLN
1.0000 | NASAL | Status: DC | PRN
  Filled 2019-02-11: qty 44

## 2019-02-11 MED ORDER — METOCLOPRAMIDE HCL 5 MG/ML IJ SOLN: 5.0000 mg | Freq: Three times a day (TID) | INTRAMUSCULAR | Status: DC | PRN

## 2019-02-11 MED ORDER — FENTANYL CITRATE (PF) 100 MCG/2ML IJ SOLN: 25.0000 ug | INTRAMUSCULAR | Status: DC | PRN

## 2019-02-11 MED ORDER — INSULIN ASPART 100 UNIT/ML ~~LOC~~ SOLN
0.0000 [IU] | Freq: Three times a day (TID) | SUBCUTANEOUS | Status: DC
  Administered 2019-02-12: 18:00:00 1 [IU] via SUBCUTANEOUS
  Administered 2019-02-13: 17:00:00 2 [IU] via SUBCUTANEOUS
  Administered 2019-02-13: 13:00:00 1 [IU] via SUBCUTANEOUS

## 2019-02-11 MED ORDER — DOCUSATE SODIUM 100 MG PO CAPS
100.0000 mg | ORAL_CAPSULE | Freq: Two times a day (BID) | ORAL | Status: DC
  Administered 2019-02-11 – 2019-03-11 (×52): 100 mg via ORAL
  Filled 2019-02-11 (×57): qty 1

## 2019-02-11 MED ORDER — HYDROCODONE-ACETAMINOPHEN 5-325 MG PO TABS: 1.0000 | ORAL_TABLET | ORAL | Status: DC | PRN

## 2019-02-11 MED ORDER — PHENYLEPHRINE 40 MCG/ML (10ML) SYRINGE FOR IV PUSH (FOR BLOOD PRESSURE SUPPORT)
PREFILLED_SYRINGE | INTRAVENOUS | Status: AC
  Filled 2019-02-11: qty 10

## 2019-02-11 MED ORDER — PROPOFOL 10 MG/ML IV BOLUS
INTRAVENOUS | Status: AC
  Filled 2019-02-11: qty 40

## 2019-02-11 MED ORDER — PROPOFOL 1000 MG/100ML IV EMUL
INTRAVENOUS | Status: AC
  Filled 2019-02-11: qty 100

## 2019-02-11 MED ORDER — ALBUMIN HUMAN 5 % IV SOLN
12.5000 g | Freq: Once | INTRAVENOUS | Status: AC
  Administered 2019-02-11: 12.5 g via INTRAVENOUS

## 2019-02-11 MED ORDER — GABAPENTIN 300 MG PO CAPS
300.0000 mg | ORAL_CAPSULE | Freq: Once | ORAL | Status: AC
  Administered 2019-02-11: 300 mg via ORAL
  Filled 2019-02-11: qty 1

## 2019-02-11 MED ORDER — ACETAMINOPHEN 500 MG PO TABS
1000.0000 mg | ORAL_TABLET | Freq: Once | ORAL | Status: AC
  Administered 2019-02-11: 1000 mg via ORAL
  Filled 2019-02-11: qty 2

## 2019-02-11 MED ORDER — METOCLOPRAMIDE HCL 5 MG PO TABS
5.0000 mg | ORAL_TABLET | Freq: Three times a day (TID) | ORAL | Status: DC | PRN
  Filled 2019-02-11: qty 2

## 2019-02-11 MED ORDER — ALBUMIN HUMAN 5 % IV SOLN
INTRAVENOUS | Status: AC
  Filled 2019-02-11: qty 250

## 2019-02-11 MED ORDER — KETAMINE HCL 50 MG/5ML IJ SOSY
PREFILLED_SYRINGE | INTRAMUSCULAR | Status: AC
  Filled 2019-02-11: qty 5

## 2019-02-11 MED ORDER — PROPOFOL 500 MG/50ML IV EMUL
INTRAVENOUS | Status: DC | PRN
  Administered 2019-02-11: 20 ug/kg/min via INTRAVENOUS

## 2019-02-11 MED ORDER — PHENYLEPHRINE HCL (PRESSORS) 10 MG/ML IV SOLN
INTRAVENOUS | Status: DC | PRN
  Administered 2019-02-11: 80 ug via INTRAVENOUS
  Administered 2019-02-11: 120 ug via INTRAVENOUS

## 2019-02-11 MED ORDER — ACETAMINOPHEN 325 MG PO TABS
325.0000 mg | ORAL_TABLET | Freq: Four times a day (QID) | ORAL | Status: DC | PRN
  Administered 2019-02-12 – 2019-02-13 (×2): 650 mg via ORAL
  Filled 2019-02-11 (×2): qty 2

## 2019-02-11 MED ORDER — MORPHINE SULFATE (PF) 2 MG/ML IV SOLN: 0.5000 mg | INTRAVENOUS | Status: DC | PRN

## 2019-02-11 MED ORDER — ONDANSETRON HCL 4 MG/2ML IJ SOLN
4.0000 mg | Freq: Four times a day (QID) | INTRAMUSCULAR | Status: DC | PRN
  Administered 2019-02-13: 02:00:00 4 mg via INTRAVENOUS
  Filled 2019-02-11: qty 2

## 2019-02-11 MED ORDER — INSULIN DETEMIR 100 UNIT/ML ~~LOC~~ SOLN
30.0000 [IU] | Freq: Every day | SUBCUTANEOUS | Status: DC
  Administered 2019-02-11 – 2019-02-22 (×12): 30 [IU] via SUBCUTANEOUS
  Filled 2019-02-11 (×13): qty 0.3

## 2019-02-11 MED ORDER — KETAMINE HCL 10 MG/ML IJ SOLN
INTRAMUSCULAR | Status: DC | PRN
  Administered 2019-02-11 (×3): 5 mg via INTRAVENOUS

## 2019-02-11 MED ORDER — LACTATED RINGERS IV SOLN
INTRAVENOUS | Status: DC
  Administered 2019-02-11: 06:00:00 via INTRAVENOUS

## 2019-02-11 MED ORDER — EPHEDRINE SULFATE 50 MG/ML IJ SOLN
INTRAMUSCULAR | Status: DC | PRN
  Administered 2019-02-11: 5 mg via INTRAVENOUS

## 2019-02-11 MED ORDER — LACTATED RINGERS IV SOLN
INTRAVENOUS | Status: DC | PRN
  Administered 2019-02-11: 07:00:00 via INTRAVENOUS

## 2019-02-11 MED ORDER — MIDODRINE HCL 5 MG PO TABS
5.0000 mg | ORAL_TABLET | Freq: Three times a day (TID) | ORAL | Status: DC
  Administered 2019-02-11 – 2019-03-11 (×84): 5 mg via ORAL
  Filled 2019-02-11 (×87): qty 1

## 2019-02-11 MED ORDER — MIDAZOLAM HCL 2 MG/2ML IJ SOLN
INTRAMUSCULAR | Status: AC
  Filled 2019-02-11: qty 2

## 2019-02-11 MED ORDER — MIDAZOLAM HCL 2 MG/2ML IJ SOLN
INTRAMUSCULAR | Status: DC | PRN
  Administered 2019-02-11 (×2): 0.5 mg via INTRAVENOUS
  Administered 2019-02-11: 1 mg via INTRAVENOUS

## 2019-02-11 MED ORDER — BUPIVACAINE-EPINEPHRINE (PF) 0.5% -1:200000 IJ SOLN
INTRAMUSCULAR | Status: DC | PRN
  Administered 2019-02-11: 10 mL via PERINEURAL
  Administered 2019-02-11: 30 mL via PERINEURAL

## 2019-02-11 MED ORDER — CHLORHEXIDINE GLUCONATE 4 % EX LIQD: 60.0000 mL | Freq: Once | CUTANEOUS | Status: DC

## 2019-02-11 MED ORDER — DOCUSATE SODIUM 100 MG PO CAPS: 100.0000 mg | ORAL_CAPSULE | Freq: Two times a day (BID) | ORAL | Status: DC

## 2019-02-11 MED ORDER — ONDANSETRON HCL 4 MG/2ML IJ SOLN
INTRAMUSCULAR | Status: DC | PRN
  Administered 2019-02-11: 4 mg via INTRAVENOUS

## 2019-02-11 MED ORDER — FUROSEMIDE 40 MG PO TABS
60.0000 mg | ORAL_TABLET | Freq: Two times a day (BID) | ORAL | Status: DC
  Administered 2019-02-11 – 2019-02-16 (×10): 60 mg via ORAL
  Filled 2019-02-11 (×10): qty 1

## 2019-02-11 MED ORDER — HYDROCODONE-ACETAMINOPHEN 7.5-325 MG PO TABS: 1.0000 | ORAL_TABLET | ORAL | Status: DC | PRN

## 2019-02-11 MED ORDER — ONDANSETRON HCL 4 MG/2ML IJ SOLN: 4.0000 mg | Freq: Once | INTRAMUSCULAR | Status: DC | PRN

## 2019-02-11 MED ORDER — ATORVASTATIN CALCIUM 40 MG PO TABS
40.0000 mg | ORAL_TABLET | Freq: Every day | ORAL | Status: DC
  Administered 2019-02-11 – 2019-03-10 (×27): 40 mg via ORAL
  Filled 2019-02-11 (×28): qty 1

## 2019-02-11 SURGICAL SUPPLY — 71 items
BANDAGE ESMARK 6X9 LF (GAUZE/BANDAGES/DRESSINGS) ×1 IMPLANT
BIT DRILL CANN LNG FLUTE 3.0 (BIT) IMPLANT
BIT DRILL CANNULATED 3.0 (BIT) ×3
BIT DRILL SOLID LONG 5.5 (BIT) ×2 IMPLANT
BLADE AVERAGE 25MMX9MM (BLADE) ×1
BLADE AVERAGE 25X9 (BLADE) ×2 IMPLANT
BLADE SURG 10 STRL SS (BLADE) IMPLANT
BNDG CMPR 9X6 STRL LF SNTH (GAUZE/BANDAGES/DRESSINGS)
BNDG COHESIVE 4X5 TAN STRL (GAUZE/BANDAGES/DRESSINGS) ×3 IMPLANT
BNDG ESMARK 6X9 LF (GAUZE/BANDAGES/DRESSINGS)
BNDG GAUZE ELAST 4 BULKY (GAUZE/BANDAGES/DRESSINGS) ×6 IMPLANT
CANISTER WOUNDNEG PRESSURE 500 (CANNISTER) ×2 IMPLANT
COVER MAYO STAND STRL (DRAPES) IMPLANT
COVER SURGICAL LIGHT HANDLE (MISCELLANEOUS) ×4 IMPLANT
COVER WAND RF STERILE (DRAPES) ×1 IMPLANT
CUFF TOURN SGL QUICK 34 (TOURNIQUET CUFF)
CUFF TOURN SGL QUICK 42 (TOURNIQUET CUFF) IMPLANT
CUFF TRNQT CYL 34X4.125X (TOURNIQUET CUFF) IMPLANT
DRAPE C-ARM 42X72 X-RAY (DRAPES) IMPLANT
DRAPE INCISE IOBAN 66X45 STRL (DRAPES) IMPLANT
DRAPE OEC MINIVIEW 54X84 (DRAPES) IMPLANT
DRAPE ORTHO SPLIT 77X108 STRL (DRAPES)
DRAPE SURG ORHT 6 SPLT 77X108 (DRAPES) IMPLANT
DRAPE U-SHAPE 47X51 STRL (DRAPES) ×3 IMPLANT
DRSG ADAPTIC 3X8 NADH LF (GAUZE/BANDAGES/DRESSINGS) ×3 IMPLANT
DRSG EMULSION OIL 3X3 NADH (GAUZE/BANDAGES/DRESSINGS) ×3 IMPLANT
DRSG PAD ABDOMINAL 8X10 ST (GAUZE/BANDAGES/DRESSINGS) ×3 IMPLANT
DURAPREP 26ML APPLICATOR (WOUND CARE) ×3 IMPLANT
ELECT REM PT RETURN 9FT ADLT (ELECTROSURGICAL) ×3
ELECTRODE REM PT RTRN 9FT ADLT (ELECTROSURGICAL) ×1 IMPLANT
GAUZE SPONGE 4X4 12PLY STRL (GAUZE/BANDAGES/DRESSINGS) ×3 IMPLANT
GLOVE BIOGEL PI IND STRL 9 (GLOVE) ×1 IMPLANT
GLOVE BIOGEL PI INDICATOR 9 (GLOVE) ×2
GLOVE SURG ORTHO 9.0 STRL STRW (GLOVE) ×3 IMPLANT
GOWN STRL REUS W/ TWL LRG LVL3 (GOWN DISPOSABLE) ×1 IMPLANT
GOWN STRL REUS W/ TWL XL LVL3 (GOWN DISPOSABLE) ×3 IMPLANT
GOWN STRL REUS W/TWL LRG LVL3 (GOWN DISPOSABLE) ×3
GOWN STRL REUS W/TWL XL LVL3 (GOWN DISPOSABLE) ×6
INSTRUMENT LRG BB TAK DISP (EXFIX) ×2 IMPLANT
KIT BASIN OR (CUSTOM PROCEDURE TRAY) ×3 IMPLANT
KIT TURNOVER KIT B (KITS) ×3 IMPLANT
MANIFOLD NEPTUNE II (INSTRUMENTS) ×3 IMPLANT
NEEDLE 22X1 1/2 (OR ONLY) (NEEDLE) ×2 IMPLANT
NS IRRIG 1000ML POUR BTL (IV SOLUTION) ×3 IMPLANT
PACK ORTHO EXTREMITY (CUSTOM PROCEDURE TRAY) ×3 IMPLANT
PAD ARMBOARD 7.5X6 YLW CONV (MISCELLANEOUS) ×2 IMPLANT
PLATE ANKLE FUSION TT RT (Plate) ×2 IMPLANT
PUTTY DBM ALLOSYNC PURE 5CC (Putty) ×2 IMPLANT
SCREW BONE TI LP 4.5X24 (Screw) ×4 IMPLANT
SCREW BONE TI LP 4.5X30 (Screw) ×2 IMPLANT
SCREW BONE TI LP 5.5X50 (Screw) ×2 IMPLANT
SCREW LOW PROFILE 4.5X28 (Screw) ×4 IMPLANT
SCREW LOW PROFILE TI 4.5X26 (Screw) ×2 IMPLANT
SPONGE LAP 18X18 RF (DISPOSABLE) ×3 IMPLANT
STAPLER VISISTAT 35W (STAPLE) IMPLANT
STOCKINETTE IMPERVIOUS 9X36 MD (GAUZE/BANDAGES/DRESSINGS) IMPLANT
SUCTION FRAZIER HANDLE 10FR (MISCELLANEOUS) ×2
SUCTION TUBE FRAZIER 10FR DISP (MISCELLANEOUS) ×1 IMPLANT
SUT ETHILON 2 0 PSLX (SUTURE) ×7 IMPLANT
SUT ETHILON 5 0 CL P 3 (SUTURE) ×2 IMPLANT
SUT VIC AB 0 CT1 27 (SUTURE)
SUT VIC AB 0 CT1 27XBRD ANBCTR (SUTURE) IMPLANT
SUT VIC AB 2-0 CT1 27 (SUTURE)
SUT VIC AB 2-0 CT1 TAPERPNT 27 (SUTURE) IMPLANT
SYR CONTROL 10ML LL (SYRINGE) ×2 IMPLANT
TOWEL GREEN STERILE (TOWEL DISPOSABLE) ×1 IMPLANT
TOWEL GREEN STERILE FF (TOWEL DISPOSABLE) ×3 IMPLANT
TUBE CONNECTING 12'X1/4 (SUCTIONS) ×1
TUBE CONNECTING 12X1/4 (SUCTIONS) ×2 IMPLANT
UNDERPAD 30X30 (UNDERPADS AND DIAPERS) ×1 IMPLANT
WATER STERILE IRR 1000ML POUR (IV SOLUTION) ×3 IMPLANT

## 2019-02-11 NOTE — H&P (Signed)
Amy Shepard is an 73 y.o. female.   Chief Complaint: closed fracture of right ankle with malunion, failure of fixation HPI: Patient is a 73 year old woman smoker on nasal cannula FiO2 diabetic who is 10 weeks status post trimalleolar right ankle fracture with syndesmotic disruption as well.  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  Patient states she lives alone she was unable to ambulate with protected weightbearing and has been full weightbearing on the fracture boot with retained hardware. She presents for removal of right ankle hardware and fusion of the right ankle.   Past Medical History:  Diagnosis Date  . Anemia   . Anxiety   . Arthritis   . CAD (coronary artery disease)    a. Prior cath 2015 showed 40% prox AD, 50-50% mLAD, otherwise calcification but no obstruction in LCx/RCA.. Medical management recommended. b. 2017: low-risk NST.  [name] Chronic diastolic CHF (congestive heart failure) ([place])   . Chronic respiratory failure ([place])   . CKD (chronic kidney disease), stage III ([place])   . Complication of anesthesia    oxygen level dropped in last surgery 11/2018  . COPD (chronic obstructive pulmonary disease) ([place])   . Cor pulmonale ([name])    a. felt due to advanced COPD and noncompliance with O2.  . Depression   . Diabetes mellitus    [name]    dx  2008  . Fracture    left foot  . Hx of seasonal allergies   . Hypercholesteremia   . Hypertension   . Lung cancer ([place])   . On home oxygen therapy    "2.5L; 24/7" (03/10/2017)  . Pericardial effusion    a. small-moderate in 07/2016.  [name] Pneumonia   . Pulmonary hypertension ([place])   . UTI (urinary tract infection)     Past Surgical History:  Procedure Laterality Date  . APPLICATION OF WOUND VAC Right 11/23/2018   Procedure: Application Of Wound Vac;  Surgeon: Newt Minion, MD;  Location:  [facility];  Service: Orthopedics;  Laterality: Right;  . [facility] in [place]     bilateral cataracts  . LEFT HEART CATHETERIZATION WITH CORONARY ANGIOGRAM N/A 07/12/2014   Procedure: LEFT HEART CATHETERIZATION WITH CORONARY ANGIOGRAM;  Surgeon: [name], MD;  Location[facility] C[facility] LAB;  Service: Cardiovascular;  Laterality: N/A;  . MAXIMUM ACCESS (MAS)POSTERIOR LUMBAR INTERBODY FUSION (PLIF) 2 LEVEL N/A 02/22/2016   Procedure: Lumbar three-four - Lumbar four-five  MAXIMUM ACCESS SURGERY  POSTERIOR LUMBAR INTERBODY FUSION;  Surgeon: [name], MD;  Location: [facility] NEURO ORS;  Service: Neurosurgery;  Laterality: N/A;  . ORIF ANKLE FRACTURE Right 11/23/2018   Procedure: OPEN REDUCTION INTERNAL FIXATION (ORIF) RIGHT ANKLE FRACTURE;  Surgeon: Newt Minion, MD;  Location: [facility];  Service: Orthopedics;  Laterality: Right;  . ORIF TOE FRACTURE Left 06/12/2017   Procedure: OPEN REDUCTION INTERNAL FIXATION (ORIF) BASE 1ST METATARSAL (TOE) FRACTURE;  Surgeon: Newt Minion, MD;  Location: [place];  Service: Orthopedics;  Laterality: Left;  . RIGHT HEART CATH N/A 08/06/2017   Procedure: RIGHT HEART CATH;  Surgeon: Larey Dresser, MD;  Location: [facility] CV LAB;  Service: Cardiovascular;  Laterality: N/A;  . THORACOTOMY Right 2010   lower  . TONSILLECTOMY      Family History  Problem  Relation Age of Onset  . Diabetes Father   . Heart failure Father   . Cancer Sister   . Diabetes Brother   . Heart failure Brother    Social History:  reports that she quit smoking about 12 years ago. She has a 30.00 pack-year smoking history. She has never used smokeless tobacco. She reports that she does not drink alcohol or use drugs.  Allergies:  Allergies  Allergen Reactions  . Amoxicillin Anaphylaxis, Hives, Rash and Other (See Comments)    Has patient had a PCN reaction causing immediate rash, facial/tongue/throat swelling, SOB or  lightheadedness with hypotension: YES Positive reaction causing SEVERE RASH INVOLVING MUCUS MEMBRANES/SKIN NECROSIS: YES Reaction that required HOSPITALIZATION: YES Reaction occurring within the last 10 years: NO    Medications Prior to Admission  Medication Sig Dispense Refill  . acetaminophen (TYLENOL) 325 MG tablet Take 650 mg by mouth every 6 (six) hours as needed for moderate pain or fever.     [name] atorvastatin (LIPITOR) 40 MG tablet Take 1 tablet (40 mg total) by mouth daily. 90 tablet 3  . azelastine (ASTELIN) 0.1 % nasal spray Place 1 spray into both nostrils 2 (two) times daily. Use in each nostril as directed 30 mL 6  . doxycycline (VIBRAMYCIN) 100 MG capsule Take 1 capsule (100 mg total) by mouth 2 (two) times daily. 28 capsule 1  . furosemide (LASIX) 40 MG tablet Take 1.5 tablets (60 mg total) by mouth 2 (two) times daily. (Patient taking differently: Take 40 mg by mouth 2 (two) times daily. ) 270 tablet 3  . gabapentin (NEURONTIN) 300 MG capsule Take 1 capsule (300 mg total) by mouth at bedtime. (Patient taking differently: Take 300 mg by mouth 3 (three) times daily. )    . Insulin Detemir (LEVEMIR FLEXTOUCH) 100 UNIT/ML Pen Inject 30 Units into the skin at bedtime. 15 mL 11  . insulin lispro (HUMALOG) 100 UNIT/ML injection Inject 2-11 Units into the skin 3 (three) times daily before meals.     . macitentan (OPSUMIT) 10 MG tablet Take 1 tablet (10 mg total) by mouth daily. 30 tablet 11  . midodrine (PROAMATINE) 5 MG tablet Take 1 tablet (5 mg total) by mouth 3 (three) times daily with meals. 90 tablet 0  . mometasone-formoterol (DULERA) 200-5 MCG/ACT AERO Inhale 2 puffs into the lungs 2 (two) times daily. 13 g 1  . OXYGEN Inhale 3 L into the lungs continuous. FOR COPD    . Selexipag (UPTRAVI) 200 MCG TABS Take 2 tablets (400 mcg total) by mouth 2 (two) times a day. (Patient taking differently: Take 600 mcg by mouth 2 (two) times a day. ) 60 tablet   . sertraline (ZOLOFT) 50 MG tablet  Take 75 mg by mouth daily.     . sodium chloride (OCEAN) 0.65 % SOLN nasal spray Place 1 spray into both nostrils as needed for congestion.  0  . TRULICITY 1.5 NF/6.2ZH SOPN Inject 1.5 mg into the vein once a week.     . citalopram (CELEXA) 10 MG tablet TAKE 1 TABLET BY MOUTH EVERY DAY (Patient not taking: Reported on 02/10/2019) 30 tablet 0  . diclofenac sodium (VOLTAREN) 1 % GEL Apply 2 g topically 4 (four) times daily. (Patient not taking: Reported on 02/10/2019) 3 Tube 1  . docusate sodium (COLACE) 100 MG capsule Take 1 capsule (100 mg total) by mouth 2 (two) times daily. (Patient not taking: Reported on 02/10/2019) 60 capsule 0  . ipratropium-albuterol (DUONEB) 0.5-2.5 (3)  MG/3ML SOLN Take 3 mLs by nebulization every 4 (four) hours as needed (wheezing, Shortness of breath). (Patient not taking: Reported on 02/10/2019) 360 mL 0  . iron polysaccharides (NIFEREX) 150 MG capsule Take 1 capsule (150 mg total) by mouth 2 (two) times daily. 60 capsule 1  . polyethylene glycol (MIRALAX / GLYCOLAX) 17 g packet Take 17 g by mouth daily as needed for mild constipation. (Patient not taking: Reported on 02/10/2019)  0  . traMADol (ULTRAM) 50 MG tablet Take 50 mg by mouth every 6 (six) hours as needed for moderate pain.      Results for orders placed or performed during the hospital encounter of 02/11/19 (from the past 48 hour(s))  Glucose, capillary     Status: None   Collection Time: 02/11/19  5:57 AM  Result Value Ref Range   Glucose-Capillary 81 70 - 99 mg/dL   Comment 1 Notify RN    Comment 2 Document in Chart   Basic metabolic panel     Status: Abnormal   Collection Time: 02/11/19  6:03 AM  Result Value Ref Range   Sodium 142 135 - 145 mmol/L   Potassium 3.6 3.5 - 5.1 mmol/L   Chloride 102 98 - 111 mmol/L   CO2 28 22 - 32 mmol/L   Glucose, Bld 92 70 - 99 mg/dL   BUN 53 (H) 8 - 23 mg/dL   Creatinine, Ser 1.51 (H) 0.44 - 1.00 mg/dL   Calcium 9.2 8.9 - 10.3 mg/dL   GFR calc non Af Amer 34 (L) >60  mL/min   GFR calc Af Amer 40 (L) >60 mL/min   Anion gap 12 5 - 15    Comment: Performed at [facility], [address] [place], [place] [address]  CBC     Status: Abnormal   Collection Time: 02/11/19  6:03 AM  Result Value Ref Range   WBC 9.1 4.0 - 10.5 K/uL   RBC 3.99 3.87 - 5.11 MIL/uL   Hemoglobin 10.0 (L) 12.0 - 15.0 g/dL   HCT 32.7 (L) 36.0 - 46.0 %   MCV 82.0 80.0 - 100.0 fL   MCH 25.1 (L) 26.0 - 34.0 pg   MCHC 30.6 30.0 - 36.0 g/dL   RDW 18.8 (H) 11.5 - 15.5 %   Platelets 214 150 - 400 K/uL    Comment: REPEATED TO VERIFY   nRBC 0.0 0.0 - 0.2 %    Comment: Performed at [name], [place] [address] [place], [place] [address]  Hemoglobin A1c     Status: Abnormal   Collection Time: 02/11/19  6:07 AM  Result Value Ref Range   Hgb A1c MFr Bld 6.9 (H) 4.8 - 5.6 %    Comment: (NOTE) Pre diabetes:          5.7%-6.4% Diabetes:              >6.4% Glycemic control for   <7.0% adults with diabetes    Mean Plasma Glucose 151.33 mg/dL    Comment: Performed at [facility] [address], [place], [place] [address]   No results found.  Review of Systems  Respiratory: Positive for shortness of breath.        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  All other systems reviewed and are negative.   Blood pressure (!) 121/49, pulse 75, temperature 97.6 F (36.4 C), temperature source Oral, resp. rate 16, height  _0  (1.575 m), weight 77.1 kg, SpO2 93 %. Physical Exam  Constitutional: She is oriented to person, place, and time. She appears well-developed and well-nourished. No distress.  Presents in wheelchair  HENT:  Head: Normocephalic and atraumatic.  Cardiovascular: Normal rate.  Respiratory: She is in respiratory distress.  Chronic respiratory failure on oxygen by nasal cannula. Becomes short of breath with general conversation.   GI: Soft.  Musculoskeletal:     Comments: Examination  patient has a palpable dorsalis pedis and posterior tibial pulse she does have swelling the hardware is prominent but there is no skin breakdown no cellulitis no drainage no signs of infection.  Ankle joint is unstable with range of motion.  Neurological: She is alert and oriented to person, place, and time. No cranial nerve deficit.  Skin: Skin is warm and dry. No erythema.  Psychiatric: She has a normal mood and affect. Her behavior is normal. Judgment and thought content normal.     Assessment/Plan Malunion of right ankle fracture with failure of fixation- Plan removal of right ankle hardware and right ankle fusion-  The procedure and benefits and risks including the risks of bleeding, infection, neurovascular injury, and possible need for further surgery were discussed at length with the patient and the patient's questions answered to her satisfaction.  The patient wishes to proceed with surgery at this time. The heart failure team has been alerted that the patient has going to be admitted for the above procedure and we will plan on following her during her hospitalization.  Erlinda Hong, PA-C 02/11/2019, 7:08 AM [facility] [phone]

## 2019-02-11 NOTE — Transfer of Care (Signed)
Immediate Anesthesia Transfer of Care Note  Patient: Amy Shepard  Procedure(s) Performed: REMOVAL DEEP HARDWARE RIGHT ANKLE (Right ) RIGHT ANKLE FUSION (Right )  Patient Location: PACU  Anesthesia Type:MAC  Level of Consciousness: awake, alert , oriented and patient cooperative  Airway & Oxygen Therapy: Patient Spontanous Breathing and Patient connected to face mask oxygen  Post-op Assessment: Report given to RN and Post -op Vital signs reviewed and stable  Post vital signs: Reviewed and stable  Last Vitals:  Vitals Value Taken Time  BP 98/52 02/11/19 0905  Temp    Pulse 73 02/11/19 0907  Resp 18 02/11/19 0907  SpO2 99 % 02/11/19 0907  Vitals shown include unvalidated device data.  Last Pain:  Vitals:   02/11/19 0601  TempSrc:   PainSc: 0-No pain      Patients Stated Pain Goal: 2 (35/57/32 2025)  Complications: No apparent anesthesia complications

## 2019-02-11 NOTE — Anesthesia Procedure Notes (Signed)
Anesthesia Regional Block: Popliteal block   Pre-Anesthetic Checklist: ,, timeout performed, Correct Patient, Correct Site, Correct Laterality, Correct Procedure, Correct Position, site marked, Risks and benefits discussed,  Surgical consent,  Pre-op evaluation,  At surgeon's request and post-op pain management  Laterality: Right  Prep: chloraprep       Needles:  Injection technique: Single-shot  Needle Type: Echogenic Needle     Needle Length: 9cm  Needle Gauge: 21     Additional Needles:   Procedures:,,,, ultrasound used (permanent image in chart),,,,  Narrative:  Start time: 02/11/2019 6:56 AM End time: 02/11/2019 7:03 AM Injection made incrementally with aspirations every 5 mL.  Performed by: Personally  Anesthesiologist: Catalina Gravel, MD  Additional Notes: No pain on injection. No increased resistance to injection. Injection made in 5cc increments.  Good needle visualization.  Patient tolerated procedure well.

## 2019-02-11 NOTE — Progress Notes (Signed)
Orthopedic Tech Progress Note Patient Details:  Amy Shepard [facility] Feb 12, 1946 [id number]  Ortho Devices Type of Ortho Device: CAM walker Ortho Device/Splint Location: right Ortho Device/Splint Interventions: Application   Post Interventions Patient Tolerated: Well Instructions Provided: Care of device   [name] 02/11/2019, 10:18 AM

## 2019-02-11 NOTE — Op Note (Signed)
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 C-arm fluoroscopy to verify reduction and to remove deep retained hardware Praveena wound VAC dressing  SURGEON:  Newt Minion, MD  PHYSICIAN ASSISTANT:None ANESTHESIA:   General  PREOPERATIVE INDICATIONS:  [name] is a  73 y.o. 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    The risks benefits and alternatives were discussed with the patient preoperatively including but not limited to the risks of infection, bleeding, nerve injury, cardiopulmonary complications, the need for revision surgery, among others, and the patient was willing to proceed.  OPERATIVE IMPLANTS: Arthrex anterior fusion plate with allograft bone graft.  Praveena wound VAC customizable and arthro-forearm dressing  _0 @  OPERATIVE FINDINGS: Charcot collapse of the ankle cultures negative for infection.  OPERATIVE PROCEDURE: Patient was brought the operating room after undergoing a popliteal block.  She then underwent a MAC anesthetic.  After adequate levels anesthesia were obtained patient's right lower extremity was prepped using DuraPrep draped into a sterile field a timeout was called patient received preoperative antibiotics.  A small stab incision was made medially and the medial screw was removed without complications.  2 incisions were made laterally and the screws were removed through the 2 incisions and then the plate was removed.  Attention was then focused on the ankle.  A anterior incision was made over the anterior tibial tendon.  The tendon was retracted and this was carried sharply down to bone directly beneath the  anterior tibial tendon.  Retractors were placed to visualize the joint and protect the neurovascular bundle.  A oscillating saw and an osteotome was used to make cuts on the tibia and talus perpendicular to the long axis of the tibia.  Curette and osteotomes and rondure was used to further debride the joint.  The wound was irrigated with normal saline the joint was reduced and the anterior plate was applied to compression screws were placed distally a screw was then placed proximally in the oblique screw hole superiorly to provide initial compression.  A lag screw technique was then used for the compression screw across the joint.  3 screws were then placed proximally 2 screws distally the compression screw.  C-arm fluoroscopy verified alignment of both AP and lateral planes.  The joint was then packed with the bone matrix after the wound was irrigated with normal saline.  2-0 nylon was used to close the skin incisions.  A Praveena customizable and arthro-form dressing were then applied covered with Covan this had a good suction fit patient was taken the PACU in stable condition.   DISCHARGE PLANNING:  Antibiotic duration: 24-hour antibiotics  Weightbearing: Nonweightbearing on the right  Pain medication: Opioid pathway  Dressing care/ Wound VAC: Continue wound VAC for 1 week  Ambulatory devices: Walker  Discharge to: Anticipate discharge to home  Follow-up: In the office 1 week post operative.

## 2019-02-11 NOTE — Anesthesia Procedure Notes (Signed)
Anesthesia Regional Block: Adductor canal block   Pre-Anesthetic Checklist: ,, timeout performed, Correct Patient, Correct Site, Correct Laterality, Correct Procedure, Correct Position, site marked, Risks and benefits discussed,  Surgical consent,  Pre-op evaluation,  At surgeon's request and post-op pain management  Laterality: Right  Prep: chloraprep       Needles:  Injection technique: Single-shot  Needle Type: Echogenic Needle     Needle Length: 9cm  Needle Gauge: 21     Additional Needles:   Procedures:,,,, ultrasound used (permanent image in chart),,,,  Narrative:  Start time: 02/11/2019 7:03 AM End time: 02/11/2019 7:06 AM Injection made incrementally with aspirations every 5 mL.  Performed by: Personally  Anesthesiologist: Catalina Gravel, MD  Additional Notes: No pain on injection. No increased resistance to injection. Injection made in 5cc increments.  Good needle visualization.  Patient tolerated procedure well.

## 2019-02-11 NOTE — Anesthesia Postprocedure Evaluation (Signed)
Anesthesia Post Note  Patient: Amy Shepard  Procedure(s) Performed: REMOVAL DEEP HARDWARE RIGHT ANKLE (Right ) RIGHT ANKLE FUSION (Right )     Patient location during evaluation: PACU Anesthesia Type: MAC and Regional Level of consciousness: awake and alert Pain management: pain level controlled Vital Signs Assessment: post-procedure vital signs reviewed and stable Respiratory status: spontaneous breathing, nonlabored ventilation, respiratory function stable and patient connected to nasal cannula oxygen Cardiovascular status: stable and blood pressure returned to baseline Postop Assessment: no apparent nausea or vomiting Anesthetic complications: no    Last Vitals:  Vitals:   02/11/19 1400 02/11/19 1430  BP: (!) 97/56 (!) 103/57  Pulse: 66 66  Resp: 14 15  Temp:    SpO2: 95% 97%    Last Pain:  Vitals:   02/11/19 1400  TempSrc:   PainSc: Asleep                 Catalina Gravel

## 2019-02-12 LAB — GLUCOSE, CAPILLARY
Glucose-Capillary: 98 mg/dL (ref 70–99)
Glucose-Capillary: 103 mg/dL — ABNORMAL HIGH (ref 70–99)
Glucose-Capillary: 139 mg/dL — ABNORMAL HIGH (ref 70–99)
Glucose-Capillary: 144 mg/dL — ABNORMAL HIGH (ref 70–99)

## 2019-02-12 NOTE — Progress Notes (Signed)
PT Cancellation Note  Patient Details Name: Amy Shepard MRN: [id number] DOB: Mar 07, 1946   Cancelled Treatment:    Reason Eval/Treat Not Completed: Fatigue/lethargy limiting ability to participate;Other (comment). Pt on 4L of O2 upon entering room with SPO2 monitor alerting as her SPO2 was in the mid to high 60's with pt supine in bed. PT increased O2 to 6L with slow increase of SPO2 to high 70's to low 80's. Pt's RN notified immediately. Pt very lethargic and unable to maintain eyes open for more than a few seconds with stimulation. PT will attempt at another time when pt is more alert and SPO2 is stable. PT will continue to follow acutely as available.    Clearnce Sorrel Phillipe Clemon 02/12/2019, 3:16 PM

## 2019-02-12 NOTE — Progress Notes (Signed)
Occupational Therapy Evaluation Patient Details Name: Amy Shepard MRN: [id number] DOB: [birth year] Today's Date: 02/12/2019    History of Present Illness Pt is a 73 y.o. F with significant PMH of COPD/chronic respiratory failure, severe pulmonary hypertension, chronic diastolic CHF, diabetes, stage III CKD, CAD who presents following a fall and a right trimalleolar ankle fracture. Now s/p ORIF 5/13    Clinical Impression   PTA, pt was living at home alone, and reports she was independent with most ADL/IADL, used [employer] delivery service and used primarily w/c for functional mobiltiy. Pt currently limited this session secondary to O2 saturations, mobility was deferred for safety. Upon arrival pt on 5lnc SpO2 low 70s, with good pleth wave. Pt repositioned higher in bed (totalA+2), cued for pused lip breathing and boosted supplemental O2 to 6Lnc able to bring SpO2 to 91%. Returned O2 to 4lnc, SpO2 fluctuating 77%-89% with pursed lip breathing. Placed O2 monitor within pt's view and educated her on continuing to perform pursed lip breathing to increase O2 number, pt verbalized and demonstrated understanding. Due to decline in current level of function, pt would benefit from acute OT to address established goals to facilitate safe D/C to venue listed below. At this time, recommend SNF follow-up. Will continue to follow acutely.     Follow Up Recommendations  SNF (HHOT pending pt's progress)   Equipment Recommendations  3 in 1 bedside commode;Hospital bed    Recommendations for Other Services PT consult     Precautions / Restrictions Precautions Precautions: Fall Precaution Comments: watch O2 Restrictions Weight Bearing Restrictions: Yes RLE Weight Bearing: Non weight bearing      Mobility Bed Mobility Overal bed mobility: Needs Assistance             General bed mobility comments: totalA+2 to reposition higher in the bed  Transfers                  General transfer comment: deferred secondary to O2    Balance                                           ADL either performed or assessed with clinical judgement   ADL Overall ADL's : Needs assistance/impaired Eating/Feeding: Set up   Grooming: Moderate assistance;Brushing hair Grooming Details (indicate cue type and reason): pt required modA for completion of brushing and braiding hair;pt O2 dropped to 71% while she was brushing hair Upper Body Bathing: Moderate assistance;Sitting   Lower Body Bathing: Maximal assistance   Upper Body Dressing : Moderate assistance   Lower Body Dressing: Total assistance                 General ADL Comments: deferred functional mobility secondary to pt's O2 sats;     Vision         Perception     Praxis      Pertinent Vitals/Pain Pain Assessment: 0-10 Pain Score: 2  Pain Location: R foot  Pain Descriptors / Indicators: Discomfort Pain Intervention(s): Limited activity within patient's tolerance;Monitored during session     Hand Dominance Right   Extremity/Trunk Assessment Upper Extremity Assessment Upper Extremity Assessment: Generalized weakness   Lower Extremity Assessment Lower Extremity Assessment: Defer to PT evaluation       Communication Communication Communication: No difficulties   Cognition Arousal/Alertness: Lethargic Behavior During Therapy: Flat affect;WFL for tasks assessed/performed Overall Cognitive Status:  Within Functional Limits for tasks assessed                                 General Comments: pt aware of why she is at the hospital, able to state she is at the hospital,   General Comments  pt on 5lnc upon arrival;spO2 low 70s;RN notified;pt repositioned higher in bed, cued for pused lip breathing and boosted O2 to 6Lnc able to bring SpO2 to 91%;returned to 4lnc SpO2 fluctuating 77-89 with pursed lip breathing. placed O2 monitor within pt's view and educated pt  on continuing to perform pursed lip breathing to increase O2 number, pt verbalized and demonstrated understanding    Exercises Exercises: Other exercises Other Exercises Other Exercises: general shoulder AROM exercises   Shoulder Instructions      Home Living Family/patient expects to be discharged to:: Private residence Living Arrangements: Alone   Type of Home: Apartment Home Access: Level entry     Home Layout: One level     Bathroom Shower/Tub: Teacher, early years/pre: Handicapped height Bathroom Accessibility: Yes How Accessible: Accessible via wheelchair Home Equipment: Kinney - 4 wheels;Shower seat;Grab bars - tub/shower;Grab bars - toilet   Additional Comments: Pt reports she has a w/c accessible apt       Prior Functioning/Environment Level of Independence: Needs assistance  Gait / Transfers Assistance Needed: uses w/c "almost all the time" ADL's / Homemaking Assistance Needed: pt had assistance with grocery shopping via [employer] delivery services;otherwise independent   Comments: 4L at all times;has transportation through insurance        OT Problem List: Decreased activity tolerance;Impaired balance (sitting and/or standing);Decreased knowledge of use of DME or AE;Decreased knowledge of precautions;Cardiopulmonary status limiting activity;Pain      OT Treatment/Interventions: Self-care/ADL training;Energy conservation;DME and/or AE instruction;Therapeutic activities;Patient/family education;Balance training    OT Goals(Current goals can be found in the care plan section) Acute Rehab OT Goals Patient Stated Goal: to feel better OT Goal Formulation: With patient Time For Goal Achievement: 02/26/19 Potential to Achieve Goals: Good ADL Goals Pt Will Perform Grooming: with modified independence Pt Will Perform Upper Body Dressing: with modified independence Pt Will Perform Lower Body Dressing: with modified independence;sit to/from stand Pt  Will Transfer to Toilet: with modified independence;ambulating Additional ADL Goal #1: Pt will independently demonstrate use of 5 energy conservation strategies.  OT Frequency: Min 2X/week   Barriers to D/C: Decreased caregiver support  pt lives alone       Co-evaluation              AM-PAC OT "6 Clicks" Daily Activity     Outcome Measure Help from another person eating meals?: A Little Help from another person taking care of personal grooming?: A Lot Help from another person toileting, which includes using toliet, bedpan, or urinal?: Total Help from another person bathing (including washing, rinsing, drying)?: A Lot Help from another person to put on and taking off regular upper body clothing?: A Lot Help from another person to put on and taking off regular lower body clothing?: Total 6 Click Score: 11   End of Session Equipment Utilized During Treatment: Oxygen Nurse Communication: Mobility status(O2 saturations)  Activity Tolerance: Treatment limited secondary to medical complications (Comment)(limited by SpO2 saturations) Patient left: in bed;with call bell/phone within reach;with bed alarm set  OT Visit Diagnosis: Other abnormalities of gait and mobility (R26.89);Pain Pain - Right/Left: Right Pain - part  of body: Ankle and joints of foot                Time: 0921-0955 OT Time Calculation (min): 34 min Charges:  OT General Charges $OT Visit: 1 Visit OT Evaluation $OT Eval Moderate Complexity: 1 Mod OT Treatments $Self Care/Home Management : 8-22 mins  Dorinda Hill OTR/L Acute Rehabilitation Services Office: [phone]   Wyn Forster 02/12/2019, 10:07 AM

## 2019-02-12 NOTE — Plan of Care (Signed)

## 2019-02-12 NOTE — Progress Notes (Signed)
Pt's O2 sat stayed on low 80s at 4L and low to mid 80s at 6L on nasal cannula. Notified respiratory therapist and was switched to high flow nasal cannula at 9L=O2 sat at 98-100%. Pt alert, stating she feels better and weaned her down to 6L. Current O2 sat at 94-96%. Will continue to monitor.

## 2019-02-12 NOTE — Plan of Care (Signed)

## 2019-02-12 NOTE — Social Work (Signed)
CSW acknowledging consult for SNF placement. Will follow for therapy recommendations.   Westley Hummer, MSW, [place] [facility] [phone]

## 2019-02-12 NOTE — Progress Notes (Signed)
Patient ID: Amy Shepard, female   DOB: 1945/11/26, 73 y.o.   MRN: [id number] Postoperative day 1 removal failed painful deep retained hardware and anterior fusion.  There is 100 cc in the wound VAC canister will have patient work with therapy and discharge to home when patient is safe with therapy as well as when the drainage has decreased.  Patient is alert oriented this morning no complaints.

## 2019-02-13 ENCOUNTER — Inpatient Hospital Stay (HOSPITAL_COMMUNITY): Payer: Medicare Other

## 2019-02-13 DIAGNOSIS — R911 Solitary pulmonary nodule: Secondary | ICD-10-CM | POA: Diagnosis present

## 2019-02-13 LAB — BLOOD GAS, ARTERIAL
Acid-Base Excess: 8.7 mmol/L — ABNORMAL HIGH (ref 0.0–2.0)
Bicarbonate: 34.1 mmol/L — ABNORMAL HIGH (ref 20.0–28.0)
Drawn by: 270221
O2 Content: 6 L/min
O2 Saturation: 84.5 %
Patient temperature: 98.1
pCO2 arterial: 60.2 mmHg — ABNORMAL HIGH (ref 32.0–48.0)
pH, Arterial: 7.37 (ref 7.350–7.450)
pO2, Arterial: 52.6 mmHg — ABNORMAL LOW (ref 83.0–108.0)

## 2019-02-13 LAB — CBC
HCT: 25.5 % — ABNORMAL LOW (ref 36.0–46.0)
Hemoglobin: 7.8 g/dL — ABNORMAL LOW (ref 12.0–15.0)
MCH: 25 pg — ABNORMAL LOW (ref 26.0–34.0)
MCHC: 30.6 g/dL (ref 30.0–36.0)
MCV: 81.7 fL (ref 80.0–100.0)
Platelets: 148 10*3/uL — ABNORMAL LOW (ref 150–400)
RBC: 3.12 MIL/uL — ABNORMAL LOW (ref 3.87–5.11)
RDW: 19 % — ABNORMAL HIGH (ref 11.5–15.5)
WBC: 8.9 10*3/uL (ref 4.0–10.5)
nRBC: 0 % (ref 0.0–0.2)

## 2019-02-13 LAB — BASIC METABOLIC PANEL
Anion gap: 10 (ref 5–15)
BUN: 62 mg/dL — ABNORMAL HIGH (ref 8–23)
CO2: 32 mmol/L (ref 22–32)
Calcium: 8.6 mg/dL — ABNORMAL LOW (ref 8.9–10.3)
Chloride: 99 mmol/L (ref 98–111)
Creatinine, Ser: 2.02 mg/dL — ABNORMAL HIGH (ref 0.44–1.00)
GFR calc Af Amer: 28 mL/min — ABNORMAL LOW (ref >60)
GFR calc non Af Amer: 24 mL/min — ABNORMAL LOW (ref >60)
Glucose, Bld: 198 mg/dL — ABNORMAL HIGH (ref 70–99)
Potassium: 4 mmol/L (ref 3.5–5.1)
Sodium: 141 mmol/L (ref 135–145)

## 2019-02-13 LAB — GLUCOSE, CAPILLARY
Glucose-Capillary: 101 mg/dL — ABNORMAL HIGH (ref 70–99)
Glucose-Capillary: 125 mg/dL — ABNORMAL HIGH (ref 70–99)
Glucose-Capillary: 157 mg/dL — ABNORMAL HIGH (ref 70–99)
Glucose-Capillary: 106 mg/dL — ABNORMAL HIGH (ref 70–99)

## 2019-02-13 LAB — BRAIN NATRIURETIC PEPTIDE: B Natriuretic Peptide: 1211.6 pg/mL — ABNORMAL HIGH (ref 0.0–100.0)

## 2019-02-13 LAB — SARS CORONAVIRUS 2 BY RT PCR (HOSPITAL ORDER, PERFORMED IN ~~LOC~~ HOSPITAL LAB): SARS Coronavirus 2: NEGATIVE

## 2019-02-13 MED ORDER — ALBUTEROL SULFATE (2.5 MG/3ML) 0.083% IN NEBU: 2.5000 mg | INHALATION_SOLUTION | RESPIRATORY_TRACT | Status: DC | PRN

## 2019-02-13 MED ORDER — INSULIN ASPART 100 UNIT/ML ~~LOC~~ SOLN
0.0000 [IU] | Freq: Every day | SUBCUTANEOUS | Status: DC
  Administered 2019-02-14: 22:00:00 3 [IU] via SUBCUTANEOUS
  Administered 2019-02-15: 2 [IU] via SUBCUTANEOUS
  Administered 2019-02-21 – 2019-02-23 (×2): 3 [IU] via SUBCUTANEOUS
  Administered 2019-02-25: 4 [IU] via SUBCUTANEOUS
  Administered 2019-02-27: 2 [IU] via SUBCUTANEOUS
  Administered 2019-02-28: 3 [IU] via SUBCUTANEOUS
  Administered 2019-03-01: 21:00:00 5 [IU] via SUBCUTANEOUS
  Administered 2019-03-03: 3 [IU] via SUBCUTANEOUS
  Administered 2019-03-07: 22:00:00 2 [IU] via SUBCUTANEOUS

## 2019-02-13 MED ORDER — HYDROCODONE-ACETAMINOPHEN 5-325 MG PO TABS
1.0000 | ORAL_TABLET | ORAL | Status: DC | PRN
  Administered 2019-02-19 – 2019-02-26 (×3): 1 via ORAL
  Filled 2019-02-13 (×4): qty 1

## 2019-02-13 MED ORDER — FUROSEMIDE 10 MG/ML IJ SOLN
40.0000 mg | Freq: Once | INTRAMUSCULAR | Status: AC
  Administered 2019-02-13: 21:00:00 40 mg via INTRAVENOUS
  Filled 2019-02-13: qty 4

## 2019-02-13 MED ORDER — ACETAMINOPHEN 325 MG PO TABS
650.0000 mg | ORAL_TABLET | Freq: Four times a day (QID) | ORAL | Status: AC
  Administered 2019-02-13 – 2019-02-18 (×19): 650 mg via ORAL
  Filled 2019-02-13 (×19): qty 2

## 2019-02-13 MED ORDER — IPRATROPIUM-ALBUTEROL 0.5-2.5 (3) MG/3ML IN SOLN
3.0000 mL | Freq: Four times a day (QID) | RESPIRATORY_TRACT | Status: DC
  Administered 2019-02-13 – 2019-02-21 (×32): 3 mL via RESPIRATORY_TRACT
  Filled 2019-02-13 (×32): qty 3

## 2019-02-13 MED ORDER — IPRATROPIUM-ALBUTEROL 0.5-2.5 (3) MG/3ML IN SOLN: 3.0000 mL | Freq: Four times a day (QID) | RESPIRATORY_TRACT | Status: DC | PRN

## 2019-02-13 MED ORDER — METHYLPREDNISOLONE SODIUM SUCC 125 MG IJ SOLR
60.0000 mg | Freq: Two times a day (BID) | INTRAMUSCULAR | Status: DC
  Administered 2019-02-14: 60 mg via INTRAVENOUS
  Filled 2019-02-13: qty 2

## 2019-02-13 MED ORDER — INSULIN ASPART 100 UNIT/ML ~~LOC~~ SOLN
0.0000 [IU] | Freq: Three times a day (TID) | SUBCUTANEOUS | Status: DC
  Administered 2019-02-14 (×2): 4 [IU] via SUBCUTANEOUS
  Administered 2019-02-15: 11 [IU] via SUBCUTANEOUS
  Administered 2019-02-15: 7 [IU] via SUBCUTANEOUS
  Administered 2019-02-15: 3 [IU] via SUBCUTANEOUS
  Administered 2019-02-16: 7 [IU] via SUBCUTANEOUS
  Administered 2019-02-16: 11 [IU] via SUBCUTANEOUS
  Administered 2019-02-16: 7 [IU] via SUBCUTANEOUS
  Administered 2019-02-17: 4 [IU] via SUBCUTANEOUS
  Administered 2019-02-17: 3 [IU] via SUBCUTANEOUS
  Administered 2019-02-19 (×2): 4 [IU] via SUBCUTANEOUS
  Administered 2019-02-20: 07:00:00 3 [IU] via SUBCUTANEOUS
  Administered 2019-02-21: 4 [IU] via SUBCUTANEOUS
  Administered 2019-02-22 (×2): 7 [IU] via SUBCUTANEOUS
  Administered 2019-02-22 – 2019-02-23 (×2): 11 [IU] via SUBCUTANEOUS
  Administered 2019-02-23: 17:00:00 15 [IU] via SUBCUTANEOUS
  Administered 2019-02-23: 7 [IU] via SUBCUTANEOUS
  Administered 2019-02-24 (×3): 11 [IU] via SUBCUTANEOUS
  Administered 2019-02-25: 7 [IU] via SUBCUTANEOUS
  Administered 2019-02-25: 11 [IU] via SUBCUTANEOUS
  Administered 2019-02-25: 4 [IU] via SUBCUTANEOUS
  Administered 2019-02-26: 11 [IU] via SUBCUTANEOUS
  Administered 2019-02-26: 20 [IU] via SUBCUTANEOUS
  Administered 2019-02-27: 7 [IU] via SUBCUTANEOUS
  Administered 2019-02-27: 3 [IU] via SUBCUTANEOUS
  Administered 2019-02-27 – 2019-02-28 (×3): 4 [IU] via SUBCUTANEOUS
  Administered 2019-03-01: 16:00:00 20 [IU] via SUBCUTANEOUS
  Administered 2019-03-01: 11 [IU] via SUBCUTANEOUS
  Administered 2019-03-02: 12:00:00 4 [IU] via SUBCUTANEOUS
  Administered 2019-03-02: 17:00:00 7 [IU] via SUBCUTANEOUS
  Administered 2019-03-03: 11 [IU] via SUBCUTANEOUS
  Administered 2019-03-04: 8 [IU] via SUBCUTANEOUS
  Administered 2019-03-04: 13:00:00 3 [IU] via SUBCUTANEOUS
  Administered 2019-03-05: 17:00:00 15 [IU] via SUBCUTANEOUS
  Administered 2019-03-05: 11 [IU] via SUBCUTANEOUS
  Administered 2019-03-06: 3 [IU] via SUBCUTANEOUS
  Administered 2019-03-06: 17:00:00 7 [IU] via SUBCUTANEOUS
  Administered 2019-03-07: 3 [IU] via SUBCUTANEOUS
  Administered 2019-03-07: 19:00:00 7 [IU] via SUBCUTANEOUS
  Administered 2019-03-09: 17:00:00 4 [IU] via SUBCUTANEOUS
  Administered 2019-03-09: 7 [IU] via SUBCUTANEOUS

## 2019-02-13 MED ORDER — INSULIN ASPART 100 UNIT/ML ~~LOC~~ SOLN
3.0000 [IU] | Freq: Three times a day (TID) | SUBCUTANEOUS | Status: DC
  Administered 2019-02-14 – 2019-02-23 (×27): 3 [IU] via SUBCUTANEOUS

## 2019-02-13 NOTE — Progress Notes (Signed)
Pt started on heated high flow nasal cannula per Dr. Evangeline Gula request. Pt drowsy and breathing thru her mouth at times. Pt titrated to 25L and 70% FiO2 to maintain her sat goal of 90% and above. RN notified. RT will continue to monitor. RN to call when pt gets a bed and is ready to be transferred to step down floor.

## 2019-02-13 NOTE — Evaluation (Signed)
Physical Therapy Evaluation Patient Details Name: Amy Shepard MRN: [id number] DOB: 02-17-1946 Today's Date: 02/13/2019   History of Present Illness  73 year old woman smoker, diabetic who is10 weeksstatus post trimalleolar rt ankle fracture with syndesmotic disruption as well. 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 States she lives alone she was unable to ambulate with protected weightbearing and has been full weightbearing on the fracture. 7/31 removal hardware, ankle fusion, wound VAC, NWB  Clinical Impression   Patient is s/p above surgery resulting in functional limitations due to the deficits listed below (see PT Problem List). Patient previously went to [facility] and states she will never go back there. She has also previously been to [facility] for SNF and is agreeable to pursuing SNF prior to d/c home.  Patient will benefit from skilled PT to increase their independence and safety with mobility to allow discharge to the venue listed below.       Follow Up Recommendations SNF    Equipment Recommendations  None recommended by PT    Recommendations for Other Services OT consult     Precautions / Restrictions Precautions Precautions: Fall Precaution Comments: watch O2 Restrictions Weight Bearing Restrictions: Yes RLE Weight Bearing: Non weight bearing      Mobility  Bed Mobility Overal bed mobility: Needs Assistance Bed Mobility: Supine to Sit;Sit to Supine     Supine to sit: Min guard;HOB elevated Sit to supine: Supervision   General bed mobility comments: totalA+2 to reposition higher in the bed  Transfers Overall transfer level: Needs assistance   Transfers: Lateral/Scoot Transfers          Lateral/Scoot Transfers: Min guard General transfer comment: along EOB, she "walked" her hips sideways using bil UEs for support/balance, could not reach lt foot to floor  easily  Ambulation/Gait                Stairs            Wheelchair Mobility    Modified Rankin (Stroke Patients Only)       Balance                                             Pertinent Vitals/Pain Pain Assessment: No/denies pain Pain Score: 0-No pain Pain Location: R foot  Pain Descriptors / Indicators: Discomfort    Home Living Family/patient expects to be discharged to:: Private residence Living Arrangements: Alone Available Help at Discharge: Family;Available PRN/intermittently(no aide; had HHRN and HHPT) Type of Home: Apartment Home Access: Level entry     Home Layout: One level Home Equipment: Walker - 4 wheels;Shower seat;Grab bars - tub/shower;Grab bars - toilet Additional Comments: Pt reports she has a w/c accessible apt     Prior Function Level of Independence: Needs assistance   Gait / Transfers Assistance Needed: uses w/c "almost all the time"; pivoted into w/c with armrest in place; uses LLE and bil UEs to propel w/c  ADL's / Homemaking Assistance Needed: pt had assistance with grocery shopping via [employer] delivery services;otherwise independent  Comments: 4L at all times;has transportation through insurance     Hand Dominance   Dominant Hand: Right    Extremity/Trunk Assessment   Upper Extremity Assessment Upper Extremity Assessment: Defer to OT evaluation    Lower Extremity Assessment Lower Extremity Assessment: Generalized weakness;RLE deficits/detail RLE  Deficits / Details: in splint with Wound VAC    Cervical / Trunk Assessment Cervical / Trunk Assessment: Other exceptions Cervical / Trunk Exceptions: obese  Communication   Communication: No difficulties  Cognition Arousal/Alertness: Lethargic Behavior During Therapy: Flat affect;WFL for tasks assessed/performed Overall Cognitive Status: Within Functional Limits for tasks assessed                                 General Comments:  able to provide history, answer questions once more alert and EOB      General Comments General comments (skin integrity, edema, etc.): on arrival on 10L HFNC with sats 80%; RN called and in to assess and assist with scooting up in bed; with new probe on earlobe and upright posture, more alert and up to 85-89%; agreed to sit EOB, at rest sats up to 93% however with any exercise drops to as low as 82% with slow recovery    Exercises Other Exercises Other Exercises: heelslides in supine with resisted extension x 5 reps with attempts to incr depths of breath and improve sats Other Exercises: proper use of IS x 2 reps x 4 sets throughout session   Assessment/Plan    PT Assessment Patient needs continued PT services  PT Problem List Decreased strength;Decreased activity tolerance;Decreased mobility;Decreased knowledge of use of DME;Cardiopulmonary status limiting activity;Obesity       PT Treatment Interventions DME instruction;Functional mobility training;Therapeutic activities;Therapeutic exercise;Patient/family education    PT Goals (Current goals can be found in the Care Plan section)  Acute Rehab PT Goals Patient Stated Goal: to get strong enought to go home PT Goal Formulation: With patient Time For Goal Achievement: 02/27/19 Potential to Achieve Goals: Good    Frequency Min 3X/week   Barriers to discharge Decreased caregiver support lives alone    Co-evaluation               AM-PAC PT "6 Clicks" Mobility  Outcome Measure Help needed turning from your back to your side while in a flat bed without using bedrails?: A Little Help needed moving from lying on your back to sitting on the side of a flat bed without using bedrails?: A Little Help needed moving to and from a bed to a chair (including a wheelchair)?: A Lot Help needed standing up from a chair using your arms (e.g., wheelchair or bedside chair)?: Total Help needed to walk in hospital room?: Total Help needed  climbing 3-5 steps with a railing? : Total 6 Click Score: 11    End of Session Equipment Utilized During Treatment: Oxygen Activity Tolerance: Treatment limited secondary to medical complications (Comment)(low sats) Patient left: in bed;with call bell/phone within reach;with bed alarm set Nurse Communication: Other (comment)(low sats on arrival; ) PT Visit Diagnosis: Muscle weakness (generalized) (M62.81);History of falling (Z91.81)    Time: 7793-9030 PT Time Calculation (min) (ACUTE ONLY): 54 min   Charges:   PT Evaluation $PT Eval Moderate Complexity: 1 Mod PT Treatments $Therapeutic Activity: 8-22 mins $Self Care/Home Management: 8-22          Barry Brunner, PT      Amy Shepard 02/13/2019, 2:41 PM

## 2019-02-13 NOTE — Plan of Care (Signed)

## 2019-02-13 NOTE — H&P (Signed)
Medical Consultation   Amy Shepard  TKW:[id number]  DOB: 08/16/1945  DOA: 02/11/2019  PCP: Maurice Small, MD (Confirm with patient/family/NH records and if not entered, this has to be entered at [facility] point of entry)  Outpatient Specialists:  Primary cardiologist is [name]   Requesting physician: Dr. Marlou Sa  Reason for consultation: Hypoxemia  History of Present Illness: Amy Shepard is an 73 y.o. female with a complicated past medical history significant for hypoxic respiratory failure in May 2020 after surgery for a mechanical fall with a repair to an ankle fracture on her right ankle.  Furthermore she has a history of chronic diastolic congestive heart failure, pulmonary hypertension, lung cancer, chronic kidney disease stage III and a recent complicated hospital course acute postoperative hypoxemic respiratory failure.  Her heart failure was managed with IV Lasix but she developed chronic hypotension and required dopamine.  At least she was discharged to inpatient rehabilitation on May 19.  She is followed by the advanced heart failure team.  Was last seen by them in January 2020.  At her last cardiology appointment it was recommended that she continue her Midrin, selexipag, and OPSUMIT for her pulmonary hypertension which she has been doing.  Usually uses approximately 4 L of oxygen at home.  I was consulted today due to hypoxemia with oxygen saturations down to the 40s.  Patient states that she feels short of breath.  She denies any current wheezing.  She reports that last hospitalization she required CPAP and absolutely does not wish to have CPAP again.  She was started on high flow nasal cannula at 15 L but sats remained 87%.  He denies any PND or orthopnea.  He has complained of increasing abdominal girth.   Review of Systems:  As per HPI otherwise 10 point review of systems negative.   Past Medical History: Past Medical History:  Diagnosis Date   . Anemia   . Anxiety   . Arthritis   . CAD (coronary artery disease)    a. Prior cath 2015 showed 40% prox AD, 50-50% mLAD, otherwise calcification but no obstruction in LCx/RCA.. Medical management recommended. b. 2017: low-risk NST.  [name] Chronic diastolic CHF (congestive heart failure) ([place])   . Chronic respiratory failure ([place])   . CKD (chronic kidney disease), stage III ([place])   . Complication of anesthesia    oxygen level dropped in last surgery 11/2018  . COPD (chronic obstructive pulmonary disease) ([place])   . Cor pulmonale ([name])    a. felt due to advanced COPD and noncompliance with O2.  . Depression   . Diabetes mellitus    [name]    dx  2008  . Fracture    left foot  . Hx of seasonal allergies   . Hypercholesteremia   . Hypertension   . Lung cancer ([place])   . On home oxygen therapy    "2.5L; 24/7" (03/10/2017)  . Pericardial effusion    a. small-moderate in 07/2016.  [name] Pneumonia   . Pulmonary hypertension ([place])   . UTI (urinary tract infection)     Past Surgical History: Past Surgical History:  Procedure Laterality Date  . APPLICATION OF WOUND VAC Right 11/23/2018   Procedure: Application Of Wound Vac;  Surgeon: Newt Minion, MD;  Location: [place];  Service: Orthopedics;  Laterality: Right;  . [facility] or [facility] in  [place]     bilateral cataracts  . LEFT HEART CATHETERIZATION WITH CORONARY ANGIOGRAM N/A 07/12/2014   Procedure: LEFT HEART CATHETERIZATION WITH CORONARY ANGIOGRAM;  Surgeon: [name], MD;  Location: [facility] CATH LAB;  Service: Cardiovascular;  Laterality: N/A;  . MAXIMUM ACCESS (MAS)POSTERIOR LUMBAR INTERBODY FUSION (PLIF) 2 LEVEL N/A 02/22/2016   Procedure: Lumbar three-four - Lumbar four-five  MAXIMUM ACCESS SURGERY  POSTERIOR LUMBAR INTERBODY FUSION;  Surgeon: [name], MD;  Location: [place] NEURO ORS;  Service: Neurosurgery;  Laterality: N/A;  . ORIF ANKLE FRACTURE Right 11/23/2018   Procedure:  OPEN REDUCTION INTERNAL FIXATION (ORIF) RIGHT ANKLE FRACTURE;  Surgeon: Newt Minion, MD;  Location: [place];  Service: Orthopedics;  Laterality: Right;  . ORIF TOE FRACTURE Left 06/12/2017   Procedure: OPEN REDUCTION INTERNAL FIXATION (ORIF) BASE 1ST METATARSAL (TOE) FRACTURE;  Surgeon: Newt Minion, MD;  Location: [place];  Service: Orthopedics;  Laterality: Left;  . RIGHT HEART CATH N/A 08/06/2017   Procedure: RIGHT HEART CATH;  Surgeon: Larey Dresser, MD;  Location: [place] CV LAB;  Service: Cardiovascular;  Laterality: N/A;  . THORACOTOMY Right 2010   lower  . TONSILLECTOMY       Allergies:   Allergies  Allergen Reactions  . Amoxicillin Anaphylaxis, Hives, Rash and Other (See Comments)    Has patient had a PCN reaction causing immediate rash, facial/tongue/throat swelling, SOB or lightheadedness with hypotension: YES Positive reaction causing SEVERE RASH INVOLVING MUCUS MEMBRANES/SKIN NECROSIS: YES Reaction that required HOSPITALIZATION: YES Reaction occurring within the last 10 years: NO     Social History:  reports that she quit smoking about 12 years ago. She has a 30.00 pack-year smoking history. She has never used smokeless tobacco. She reports that she does not drink alcohol or use drugs.   Family History: Family History  Problem Relation Age of Onset  . Diabetes Father   . Heart failure Father   . Cancer Sister   . Diabetes Brother   . Heart failure Brother     Physical Exam: Vitals:   02/13/19 1604 02/13/19 1610 02/13/19 1715 02/13/19 1721  BP:      Pulse:   75   Resp: 20     Temp:      TempSrc:      SpO2:  (!) 89% (!) 82% (!) 86%  Weight:      Height:        Constitutional:   Alert and awake, oriented x3, not in any acute distress.  Well-developed well-nourished nodding off in the bed wakes up easily Eyes: PERLA, EOMI, irises appear normal, anicteric sclera,  ENMT: external ears and nose appear normal, normal hearing            Lips appears  normal, oropharynx mucosa, tongue, posterior pharynx appear normal  Neck: neck appears normal, no masses, normal ROM, no thyromegaly, no JVD  CVS: S1-S2 clear, no murmur rubs or gallops, no LE edema, normal pedal pulses, no JVP no JVD Respiratory:  clear to auscultation bilaterally, no wheezing, rales or rhonchi. Respiratory effort normal. No accessory muscle use.  Generally poor air movement without wheezing Abdomen: soft nontender, nondistended, normal bowel sounds, no hepatosplenomegaly, no hernias lightly increased abdominal girth Musculoskeletal: : no cyanosis, clubbing or edema noted bilaterally, age-related arthropathy Neuro: Cranial nerves II-XII intact, strength, sensation, reflexes Psych: judgement and insight appear normal, stable mood and affect, mental status Skin: no rashes or lesions or ulcers, no induration or  nodules    Data reviewed:  I have personally reviewed following labs and imaging studies Labs:  CBC: Recent Labs  Lab 02/11/19 0603  WBC 9.1  HGB 10.0*  HCT 32.7*  MCV 82.0  PLT 048    Basic Metabolic Panel: Recent Labs  Lab 02/11/19 0603  NA 142  K 3.6  CL 102  CO2 28  GLUCOSE 92  BUN 53*  CREATININE 1.51*  CALCIUM 9.2   GFR Estimated Creatinine Clearance: 32.4 mL/min (A) (by C-G formula based on SCr of 1.51 mg/dL (H)). CBG: Recent Labs  Lab 02/12/19 1631 02/12/19 2048 02/13/19 0739 02/13/19 1154 02/13/19 1628  GLUCAP 139* 144* 101* 125* 157*   =Hgb A1c Recent Labs    02/11/19 0607  HGBA1C 6.9*   Urinalysis    Component Value Date/Time   COLORURINE YELLOW 04/09/2018 1910   APPEARANCEUR CLEAR 04/09/2018 1910   LABSPEC 1.009 04/09/2018 1910   PHURINE 5.0 04/09/2018 1910   GLUCOSEU NEGATIVE 04/09/2018 1910   HGBUR NEGATIVE 04/09/2018 1910   BILIRUBINUR NEGATIVE 04/09/2018 1910   KETONESUR NEGATIVE 04/09/2018 1910   PROTEINUR NEGATIVE 04/09/2018 1910   UROBILINOGEN 0.2 02/10/2013 1041   NITRITE NEGATIVE 04/09/2018 1910    LEUKOCYTESUR NEGATIVE 04/09/2018 1910     Microbiology Recent Results (from the past 240 hour(s))  SARS Coronavirus 2 (Performed in [facility] hospital lab)     Status: None   Collection Time: 02/09/19 11:37 AM   Specimen: Nasal Swab  Result Value Ref Range Status   SARS Coronavirus 2 NEGATIVE NEGATIVE Final    Comment: (NOTE) SARS-CoV-2 target nucleic acids are NOT DETECTED. The SARS-CoV-2 RNA is generally detectable in upper and lower respiratory specimens during the acute phase of infection. Negative results do not preclude SARS-CoV-2 infection, do not rule out co-infections with other pathogens, and should not be used as the sole basis for treatment or other patient management decisions. Negative results must be combined with clinical observations, patient history, and epidemiological information. The expected result is Negative. Fact Sheet for Patients: SugarRoll.be Fact Sheet for Healthcare Providers: https://www.woods-mathews.com/ This test is not yet approved or cleared by the [place] FDA and  has been authorized for detection and/or diagnosis of SARS-CoV-2 by FDA under an Emergency Use Authorization (EUA). This EUA will remain  in effect (meaning this test can be used) for the duration of the COVID-19 declaration under Section 56 4(b)(1) of the Act, 21 U.S.C. section 360bbb-3(b)(1), unless the authorization is terminated or revoked sooner. Performed at [facility], [place] [address], [place], [place] [address]   Surgical pcr screen     Status: None   Collection Time: 02/11/19  6:07 AM   Specimen: Nasal Mucosa; Nasal Swab  Result Value Ref Range Status   MRSA, PCR NEGATIVE NEGATIVE Final   Staphylococcus aureus NEGATIVE NEGATIVE Final    Comment: (NOTE) The Xpert SA Assay (FDA approved for NASAL specimens in patients 20 years of age and older), is one component of a comprehensive surveillance program. It is not  intended to diagnose infection nor to guide or monitor treatment. Performed at [facility], [place] [address] [place], [place] [address]   Aerobic/Anaerobic Culture (surgical/deep wound)     Status: None (Preliminary result)   Collection Time: 02/11/19  7:50 AM   Specimen: Soft Tissue, Other  Result Value Ref Range Status   Specimen Description TISSUE RIGHT ANKLE MEDIAL  Final   Special Requests NONE  Final   Gram Stain  Final    RARE WBC PRESENT, PREDOMINANTLY PMN NO ORGANISMS SEEN    Culture   Final    NO GROWTH 2 DAYS NO ANAEROBES ISOLATED; CULTURE IN PROGRESS FOR 5 DAYS Performed at [facility] [address] [place], [place] [address]    Report Status PENDING  Incomplete       Inpatient Medications:   Scheduled Meds: . acetaminophen  650 mg Oral Q6H  . atorvastatin  40 mg Oral q1800  . azelastine  1 spray Each Nare BID  . docusate sodium  100 mg Oral BID  . furosemide  60 mg Oral BID  . gabapentin  300 mg Oral TID  . [START ON 02/14/2019] insulin aspart  0-20 Units Subcutaneous TID WC  . insulin aspart  0-5 Units Subcutaneous QHS  . [START ON 02/14/2019] insulin aspart  3 Units Subcutaneous TID WC  . insulin detemir  30 Units Subcutaneous QHS  . ipratropium-albuterol  3 mL Nebulization Q6H  . iron polysaccharides  150 mg Oral BID  . macitentan  10 mg Oral Daily  . methylPREDNISolone (SOLU-MEDROL) injection  60 mg Intravenous Q12H  . midodrine  5 mg Oral TID WC  . mometasone-formoterol  2 puff Inhalation BID  . Selexipag  600 mcg Oral BID  . sertraline  75 mg Oral Daily  . traMADol  50 mg Oral Q6H   Continuous Infusions:   Radiological Exams on Admission: No results found.  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  -She has history of O2-dependent COPD and had similar episode last month when initailly operated upon -She does not have fever or leukocytosis. Chest x-ray is pending -will transfer pt to stepdown patient - with history of similar episode and markedly decreased O2 sats (into the 40s), it seems likely that she will need several days of hospitalization to show sufficient improvement for discharge. -Nebulizers: scheduled Duoneb and prn albuterol -Solu-Medrol 60 mg IV BID  -PRN Ativan due to anxiety associated with albuterol if necessary.  Will monitor response to increased nebs first -We will add Lasix 40 mg IV twice daily first dose tonight. BNP markedly elevated at 1211.  10 months ago was 297.  Interestingly chest x-ray shows enlarged heart but no pulmonary edema. -Patient is refusing CPAP or BiPAP.  She will accept heated high flow oxygen.  Given the amount of oxygen she is requiring and her refusal to use CPAP or BiPAP I am going to transfer her to the stepdown unit as she is a full code. -If symptoms do not improve by the morning would consider cardiology and pulmonary consultation.  Diabetes mellitus type 2: Sliding scale insulin coverage ordered as the patient has  been put on Solu-Medrol.  Pulmonary fibrosis: Continue home medications as noted  above.  Right ankle surgery for Charcot ankle: Continue management per orthopedics     DVT prophylaxis: Lovenox  Code Status:  DNR - confirmed with patient Family Communication: None present Disposition Plan:  Home once clinically improved Consults called: CM/SW/PT/OT/Nutrition/RT  Admission status: Admit - It is my clinical opinion that admission to INPATIENT is reasonable and necessary because this patient will require at least 2 midnights in the hospital to treat this condition based on the medical complexity of the problems presented.  Given the aforementioned information, the predictability of an adverse outcome is felt to be significant.     Thank you for this consultation.  Our [facility] hospitalist team will follow the patient with you.   Time Spent: 78 minutes  Lady Deutscher M.D. [facility] 02/13/2019, 5:57 PM

## 2019-02-13 NOTE — Plan of Care (Signed)

## 2019-02-13 NOTE — Progress Notes (Signed)
Pt called RN to room complaining of headache and chest tightness. Pt connected to continuous pulse ox reading low 40s. Placed Pt on 15L non-rebreather mask, O2 sat went up to mid to high 70s with continued labored breathing. Vital signs taken and recorded, placed Pt on high-fowler's and IS done. RT came to room, placed Pt on high flow nasal cannula at 15L, O2 sats came up to high 80s and low 90s. RT obtained ABG. Pt stated she feels better, denies chest tightness but complained of headache. PRN med given, current O2 sat at 86% on 14L. MD notified, will continue to monitor.

## 2019-02-13 NOTE — Progress Notes (Signed)
  Subjective: Patient is stable this morning.  Somewhat somnolent.  Getting ready to work with therapy.  She does not really think she is going to be able to maintain nonweightbearing status on that right lower extremity.   Objective: Vital signs in last 24 hours: Temp:  [97.7 F (36.5 C)-98.8 F (37.1 C)] 97.7 F (36.5 C) (08/02 0941) Pulse Rate:  [73-77] 73 (08/02 0941) Resp:  [13-18] 16 (08/02 0941) BP: (109-117)/(51-52) 112/51 (08/02 0941) SpO2:  [86 %-98 %] 91 % (08/02 0941)  Intake/Output from previous day: 08/01 0701 - 08/02 0700 In: 1560 [P.O.:1560] Out: 970 [Urine:950; Drains:20] Intake/Output this shift: Total I/O In: 480 [P.O.:480] Out: -   Exam:  No cellulitis present Compartment soft  Labs: Recent Labs    02/11/19 0603  HGB 10.0*   Recent Labs    02/11/19 0603  WBC 9.1  RBC 3.99  HCT 32.7*  PLT 214   Recent Labs    02/11/19 0603  NA 142  K 3.6  CL 102  CO2 28  BUN 53*  CREATININE 1.51*  GLUCOSE 92  CALCIUM 9.2   No results for input(s): LABPT, INR in the last 72 hours.  Assessment/Plan: Plan at this time is to work with therapy.  Do not see her being safe for discharge today.  May be a few days or she may need skilled nursing in the future.   Amy Shepard 02/13/2019, 12:08 PM

## 2019-02-14 ENCOUNTER — Inpatient Hospital Stay (HOSPITAL_COMMUNITY): Payer: Medicare Other

## 2019-02-14 ENCOUNTER — Encounter (HOSPITAL_COMMUNITY): Payer: Self-pay | Admitting: Orthopedic Surgery

## 2019-02-14 DIAGNOSIS — I361 Nonrheumatic tricuspid (valve) insufficiency: Secondary | ICD-10-CM

## 2019-02-14 LAB — BASIC METABOLIC PANEL
Anion gap: 10 (ref 5–15)
BUN: 55 mg/dL — ABNORMAL HIGH (ref 8–23)
CO2: 35 mmol/L — ABNORMAL HIGH (ref 22–32)
Calcium: 8.8 mg/dL — ABNORMAL LOW (ref 8.9–10.3)
Chloride: 96 mmol/L — ABNORMAL LOW (ref 98–111)
Creatinine, Ser: 1.63 mg/dL — ABNORMAL HIGH (ref 0.44–1.00)
GFR calc Af Amer: 36 mL/min — ABNORMAL LOW (ref >60)
GFR calc non Af Amer: 31 mL/min — ABNORMAL LOW (ref >60)
Glucose, Bld: 98 mg/dL (ref 70–99)
Potassium: 3.3 mmol/L — ABNORMAL LOW (ref 3.5–5.1)
Sodium: 141 mmol/L (ref 135–145)

## 2019-02-14 LAB — CBC WITH DIFFERENTIAL/PLATELET
Abs Immature Granulocytes: 0.07 10*3/uL (ref 0.00–0.07)
Basophils Absolute: 0 10*3/uL (ref 0.0–0.1)
Basophils Relative: 0 %
Eosinophils Absolute: 0.2 10*3/uL (ref 0.0–0.5)
Eosinophils Relative: 2 %
HCT: 27.1 % — ABNORMAL LOW (ref 36.0–46.0)
Hemoglobin: 8.1 g/dL — ABNORMAL LOW (ref 12.0–15.0)
Immature Granulocytes: 1 %
Lymphocytes Relative: 5 %
Lymphs Abs: 0.4 10*3/uL — ABNORMAL LOW (ref 0.7–4.0)
MCH: 24.6 pg — ABNORMAL LOW (ref 26.0–34.0)
MCHC: 29.9 g/dL — ABNORMAL LOW (ref 30.0–36.0)
MCV: 82.4 fL (ref 80.0–100.0)
Monocytes Absolute: 0.5 10*3/uL (ref 0.1–1.0)
Monocytes Relative: 6 %
Neutro Abs: 6.9 10*3/uL (ref 1.7–7.7)
Neutrophils Relative %: 86 %
Platelets: 150 10*3/uL (ref 150–400)
RBC: 3.29 MIL/uL — ABNORMAL LOW (ref 3.87–5.11)
RDW: 18.6 % — ABNORMAL HIGH (ref 11.5–15.5)
WBC: 8.1 10*3/uL (ref 4.0–10.5)
nRBC: 0 % (ref 0.0–0.2)

## 2019-02-14 LAB — GLUCOSE, CAPILLARY
Glucose-Capillary: 133 mg/dL — ABNORMAL HIGH (ref 70–99)
Glucose-Capillary: 84 mg/dL (ref 70–99)
Glucose-Capillary: 87 mg/dL (ref 70–99)
Glucose-Capillary: 159 mg/dL — ABNORMAL HIGH (ref 70–99)
Glucose-Capillary: 180 mg/dL — ABNORMAL HIGH (ref 70–99)
Glucose-Capillary: 257 mg/dL — ABNORMAL HIGH (ref 70–99)

## 2019-02-14 LAB — ECHOCARDIOGRAM COMPLETE
Height: 62 in
Weight: 2720 oz

## 2019-02-14 MED ORDER — METHYLPREDNISOLONE SODIUM SUCC 40 MG IJ SOLR
40.0000 mg | Freq: Two times a day (BID) | INTRAMUSCULAR | Status: AC
  Administered 2019-02-14 – 2019-02-16 (×4): 40 mg via INTRAVENOUS
  Filled 2019-02-14 (×4): qty 1

## 2019-02-14 MED ORDER — POTASSIUM CHLORIDE CRYS ER 20 MEQ PO TBCR
20.0000 meq | EXTENDED_RELEASE_TABLET | Freq: Two times a day (BID) | ORAL | Status: DC
  Administered 2019-02-14 – 2019-03-11 (×51): 20 meq via ORAL
  Filled 2019-02-14 (×52): qty 1

## 2019-02-14 MED ORDER — ORAL CARE MOUTH RINSE
15.0000 mL | Freq: Two times a day (BID) | OROMUCOSAL | Status: DC
  Administered 2019-02-15 – 2019-03-11 (×43): 15 mL via OROMUCOSAL

## 2019-02-14 MED ORDER — PERFLUTREN LIPID MICROSPHERE
1.0000 mL | INTRAVENOUS | Status: AC | PRN
  Administered 2019-02-14: 4 mL via INTRAVENOUS
  Filled 2019-02-14: qty 10

## 2019-02-14 NOTE — Progress Notes (Signed)
Inpatient Rehabilitation-Admissions Coordinator   Please see note from PM&R PA, Reesa Chew. AC will sign off and contact CM/SW regarding SNF recommendation.   Jhonnie Garner, OTR/L  Rehab Admissions Coordinator  [phone] 02/14/2019 9:55 AM

## 2019-02-14 NOTE — Progress Notes (Signed)
Patient ID: Amy Shepard, female   DOB: Nov 12, 1945, 73 y.o.   MRN: [id number] Postop day 3 right ankle fusion.  Patient states she feels a lot better.  O2 sats in the high 80s.  There is 150 cc total in the wound VAC canister.  To help patient with transfers and therapy will allow weightbearing as tolerated with the fracture boot on the right.

## 2019-02-14 NOTE — Progress Notes (Signed)
PROGRESS NOTE    Amy Shepard  NOM:[id number] DOB: 1945-12-30 DOA: 02/11/2019 PCP: Maurice Small, MD    Brief Narrative:  73 year old female with extensive medical problems including chronic hypoxic respiratory failure on 4 L of oxygen at home, traumatic right ankle fracture with nonunion, chronic diastolic heart failure, pulmonary hypertension, lung cancer, CKD stage III, recent complicated postop course in the hospital who was brought as elective patient for nonhealing right trimalleolar ankle fracture and underwent hardware removal and fusion of the ankle.  Postoperative patient is lethargic and weak requiring increased amount of oxygen so medical consultation.   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  Her oxygen requirement has been exacerbated by postop stress.  No evidence of pneumonia.  No evidence of other complications. Treating for COPD with IV steroids and aggressive bronchodilator therapy.  Patient will need chest physiotherapy and incentive spirometry and aggressive pulmonary toileting.  She does not have severe wheezing, will taper off  steroid. She was given a dose of Lasix with good response, now back to oral Lasix and she is euvolemic. Patient has pulmonary hypertension and pulmonary fibrosis and she is on multiple medications that she will continue. Keep on oxygen to keep saturations more than 90%.  She may take some time to recover from her increased oxygen requirement.  Continue to work with PT OT.  Type 2 diabetes: On insulin that she will continue.  Will taper off on the steroids.  Nonunion right ankle fracture: Postop surgical management as per surgery.  CKD stage III: With baseline creatinine about 1.5.  At about her normal levels.  Will need close monitoring.  Hypokalemia: Replace and monitor levels.  Check magnesium levels.  We will continue to follow patient throughout her hospitalization.   DVT prophylaxis: Lovenox Code Status: Full code Family Communication: None Disposition Plan: As per surgery.  Anticipate SNF placement.   Procedures:   Right ankle surgery  Antimicrobials:   None   Subjective: Patient seen and examined.  No overnight events.  She remains on 15 L of high flow oxygen, however she was comfortable.  Denies any wheezing chest pain or sputum production.  Remains afebrile.  Objective: Vitals:   02/14/19 0300 02/14/19 0742 02/14/19 0807 02/14/19 0812  BP: (!) 112/46 (!) 118/56    Pulse: 70 72    Resp: (!) 24 14    Temp: (!) 97.5 F (36.4 C) 97.6 F (36.4 C)    TempSrc: Oral Oral    SpO2: 98% 95% 100% 93%  Weight:      Height:        Intake/Output Summary (Last 24 hours) at 02/14/2019 1034 Last data filed at 02/14/2019 0746 Gross per 24 hour  Intake 1080 ml  Output 1800 ml  Net -720 ml   Filed Weights   02/11/19 0553  Weight: 77.1 kg    Examination:  General exam: Appears calm and comfortable, on high flow oxygen.  Chronically sick looking.  Not in any distress. Respiratory system: Clear to auscultation. Respiratory effort normal.  No added sounds. Cardiovascular  system: S1 & S2 heard, RRR. No JVD, murmurs, rubs, gallops or clicks. No pedal edema. Gastrointestinal system: Abdomen is nondistended, soft and nontender. No organomegaly or masses felt. Normal bowel sounds heard. Central nervous system: Alert and oriented. No focal neurological deficits. Extremities: Symmetric 5 x 5 power.  Right ankle on postop compression dressing, not removed by me. Skin: No rashes, lesions or ulcers Psychiatry: Judgement and insight appear normal. Mood & affect appropriate.     Data Reviewed: I have personally reviewed following labs and imaging studies  CBC: Recent Labs  Lab 02/11/19 0603 02/13/19 1805 02/14/19 0744  WBC 9.1 8.9 8.1  NEUTROABS  --   --  6.9  HGB 10.0* 7.8* 8.1*  HCT 32.7* 25.5* 27.1*  MCV 82.0 81.7 82.4  PLT 214 148* 793   Basic Metabolic Panel: Recent Labs  Lab 02/11/19 0603 02/13/19 1805 02/14/19 0744  NA 142 141 141  K 3.6 4.0 3.3*  CL 102 99 96*  CO2 28 32 35*  GLUCOSE 92 198* 98  BUN 53* 62* 55*  CREATININE 1.51* 2.02* 1.63*  CALCIUM 9.2 8.6* 8.8*   GFR: Estimated Creatinine Clearance: 30 mL/min (A) (by C-G formula based on SCr of 1.63 mg/dL (H)). Liver Function Tests: No results for input(s): AST, ALT, ALKPHOS, BILITOT, PROT, ALBUMIN in the last 168 hours. No results for input(s): LIPASE, AMYLASE in the last 168 hours. No results for input(s): AMMONIA in the last 168 hours. Coagulation Profile: No results for input(s): INR, PROTIME in the last 168 hours. Cardiac Enzymes: No results for input(s): CKTOTAL, CKMB, CKMBINDEX, TROPONINI in the last 168 hours. BNP (last 3 results) No results for input(s): PROBNP in the last 8760 hours. HbA1C: No results for input(s): HGBA1C in the last 72 hours. CBG: Recent Labs  Lab 02/13/19 0739 02/13/19 1154 02/13/19 1628 02/13/19 2120 02/14/19 0749  GLUCAP 101* 125* 157* 106* 87   Lipid Profile: No results for input(s): CHOL, HDL, LDLCALC, TRIG, CHOLHDL, LDLDIRECT in the last  72 hours. Thyroid Function Tests: No results for input(s): TSH, T4TOTAL, FREET4, T3FREE, THYROIDAB in the last 72 hours. Anemia Panel: No results for input(s): VITAMINB12, FOLATE, FERRITIN, TIBC, IRON, RETICCTPCT in the last 72 hours. Sepsis Labs: No results for input(s): PROCALCITON, LATICACIDVEN in the last 168 hours.  Recent Results (from the past 240 hour(s))  SARS Coronavirus 2 (Performed in [facility] lab)     Status: None   Collection Time: 02/09/19 11:37 AM   Specimen: Nasal Swab  Result Value Ref Range Status   SARS Coronavirus 2 NEGATIVE NEGATIVE Final    Comment: (NOTE) SARS-CoV-2 target nucleic acids are NOT DETECTED. The SARS-CoV-2 RNA is generally detectable in upper and lower respiratory specimens during the acute phase of infection. Negative results do not preclude SARS-CoV-2 infection, do not rule out co-infections with other pathogens, and should not be used as the sole basis for treatment or other patient management decisions. Negative results must be combined with clinical observations, patient history, and epidemiological information. The expected result is Negative. Fact Sheet for Patients: SugarRoll.be Fact Sheet for Healthcare Providers: https://www.woods-mathews.com/ This test is not yet approved or cleared by the [place]  States FDA and  has been authorized for detection and/or diagnosis of SARS-CoV-2 by FDA under an Emergency Use Authorization (EUA). This EUA will remain  in effect (meaning this test can be used) for the duration of the COVID-19 declaration under Section 56 4(b)(1) of the Act, 21 U.S.C. section 360bbb-3(b)(1), unless the authorization is terminated or revoked sooner. Performed at [facility], [place] [address], [place], [place] [address]   Surgical pcr screen     Status: None   Collection Time: 02/11/19  6:07 AM   Specimen: Nasal Mucosa; Nasal Swab  Result Value Ref Range Status    MRSA, PCR NEGATIVE NEGATIVE Final   Staphylococcus aureus NEGATIVE NEGATIVE Final    Comment: (NOTE) The Xpert SA Assay (FDA approved for NASAL specimens in patients 83 years of age and older), is one component of a comprehensive surveillance program. It is not intended to diagnose infection nor to guide or monitor treatment. Performed at [facility], [place] [address], [place], [place] [address]   Aerobic/Anaerobic Culture (surgical/deep wound)     Status: None (Preliminary result)   Collection Time: 02/11/19  7:50 AM   Specimen: Soft Tissue, Other  Result Value Ref Range Status   Specimen Description TISSUE RIGHT ANKLE MEDIAL  Final   Special Requests NONE  Final   Gram Stain   Final    RARE WBC PRESENT, PREDOMINANTLY PMN NO ORGANISMS SEEN    Culture   Final    NO GROWTH 2 DAYS NO ANAEROBES ISOLATED; CULTURE IN PROGRESS FOR 5 DAYS Performed at [facility], [place] [address], [place], [place] [address]    Report Status PENDING  Incomplete  SARS Coronavirus 2 [facility] order, Performed in [facility] hospital lab) Nasopharyngeal Nasopharyngeal Swab     Status: None   Collection Time: 02/13/19  6:42 PM   Specimen: Nasopharyngeal Swab  Result Value Ref Range Status   SARS Coronavirus 2 NEGATIVE NEGATIVE Final    Comment: (NOTE) If result is NEGATIVE SARS-CoV-2 target nucleic acids are NOT DETECTED. The SARS-CoV-2 RNA is generally detectable in upper and lower  respiratory specimens during the acute phase of infection. The lowest  concentration of SARS-CoV-2 viral copies this assay can detect is 250  copies / mL. A negative result does not preclude SARS-CoV-2 infection  and should not be used as the sole basis for treatment or other  patient management decisions.  A negative result may occur with  improper specimen collection / handling, submission of specimen other  than nasopharyngeal swab, presence of viral mutation(s) within the  areas targeted by this assay, and  inadequate number of viral copies  (<250 copies / mL). A negative result must be combined with clinical  observations, patient history, and epidemiological information. If result is POSITIVE SARS-CoV-2 target nucleic acids are DETECTED. The SARS-CoV-2 RNA is generally detectable in upper and lower  respiratory specimens dur ing the acute phase of infection.  Positive  results are indicative of active infection with SARS-CoV-2.  Clinical  correlation with patient history and other diagnostic information is  necessary to determine patient infection status.  Positive results do  not rule out bacterial infection or co-infection with other viruses. If result is PRESUMPTIVE POSTIVE SARS-CoV-2 nucleic acids MAY BE PRESENT.   A presumptive positive result was obtained on the submitted specimen  and confirmed on repeat testing.  While 2019 novel coronavirus  (SARS-CoV-2) nucleic acids may be present in the submitted sample  additional confirmatory testing  may be necessary for epidemiological  and / or clinical management purposes  to differentiate between  SARS-CoV-2 and other Sarbecovirus currently known to infect humans.  If clinically indicated additional testing with an alternate test  methodology [id number]) is advised. The SARS-CoV-2 RNA is generally  detectable in upper and lower respiratory sp ecimens during the acute  phase of infection. The expected result is Negative. Fact Sheet for Patients:  StrictlyIdeas.no Fact Sheet for Healthcare Providers: BankingDealers.co.za This test is not yet approved or cleared by the [place] FDA and has been authorized for detection and/or diagnosis of SARS-CoV-2 by FDA under an Emergency Use Authorization (EUA).  This EUA will remain in effect (meaning this test can be used) for the duration of the COVID-19 declaration under Section 564(b)(1) of the Act, 21 U.S.C. section 360bbb-3(b)(1), unless the  authorization is terminated or revoked sooner. Performed at [facility], [place] [address], [place], [place] [address]          Radiology Studies: Dg Chest 1 View  Result Date: 02/13/2019 CLINICAL DATA:  Pt complains of difficulty breathing and SOB. Hx of pulmonary HTN and COPD. Pt in hospital for surgery to her ankle. EXAM: CHEST  1 VIEW COMPARISON:  12/10/18, CT 01/04/2019 FINDINGS: Enlarged cardiac silhouette. Lungs are hyperinflated. Bands of atelectasis similar comparison CT. Chronic bronchitic markings are present. New 7 mm RIGHT upper lobe pulmonary nodule. LEFT basilar atelectasis. No focal consolidation IMPRESSION: 1. New RIGHT upper lobe pulmonary nodule. Recommend follow-up radiograph or CT evaluation. 2. Cardiomegaly and LEFT lingular atelectasis. 3. Chronic bronchitic markings. Electronically Signed   By: Suzy Bouchard M.D.   On: 02/13/2019 18:04        Scheduled Meds: . acetaminophen  650 mg Oral Q6H  . atorvastatin  40 mg Oral q1800  . azelastine  1 spray Each Nare BID  . docusate sodium  100 mg Oral BID  . furosemide  60 mg Oral BID  . gabapentin  300 mg Oral TID  . insulin aspart  0-20 Units Subcutaneous TID WC  . insulin aspart  0-5 Units Subcutaneous QHS  . insulin aspart  3 Units Subcutaneous TID WC  . insulin detemir  30 Units Subcutaneous QHS  . ipratropium-albuterol  3 mL Nebulization Q6H  . iron polysaccharides  150 mg Oral BID  . macitentan  10 mg Oral Daily  . methylPREDNISolone (SOLU-MEDROL) injection  60 mg Intravenous Q12H  . midodrine  5 mg Oral TID WC  . mometasone-formoterol  2 puff Inhalation BID  . potassium chloride  20 mEq Oral BID  . Selexipag  600 mcg Oral BID  . sertraline  75 mg Oral Daily  . traMADol  50 mg Oral Q6H   Continuous Infusions:   LOS: 3 days    Time spent: 25 minutes    Barb Merino, MD [facility] Pager [phone]  If 7PM-7AM, please contact night-coverage [email or web address] Password [email or web address]  02/14/2019, 10:34 AM

## 2019-02-14 NOTE — Progress Notes (Signed)
Thank you for consult on Amy Shepard. Patient well know to CIR from prior stay this spring. She has cardiac as well as pulmonary limitations impacting her overall endurance as well as poor social support with safety concerns for safe discharge to home. gree with recommendations of SNF for follow up therapy. Will defer CIR consult.

## 2019-02-14 NOTE — Progress Notes (Signed)
  Echocardiogram 2D Echocardiogram with Definity has been performed.  Amy Shepard 02/14/2019, 10:36 AM

## 2019-02-15 ENCOUNTER — Inpatient Hospital Stay (HOSPITAL_COMMUNITY): Payer: Medicare Other

## 2019-02-15 ENCOUNTER — Other Ambulatory Visit: Payer: Self-pay | Admitting: Physical Medicine and Rehabilitation

## 2019-02-15 LAB — CBC WITH DIFFERENTIAL/PLATELET
Abs Immature Granulocytes: 0.07 10*3/uL (ref 0.00–0.07)
Basophils Absolute: 0 10*3/uL (ref 0.0–0.1)
Basophils Relative: 0 %
Eosinophils Absolute: 0 10*3/uL (ref 0.0–0.5)
Eosinophils Relative: 0 %
HCT: 24.3 % — ABNORMAL LOW (ref 36.0–46.0)
Hemoglobin: 7.3 g/dL — ABNORMAL LOW (ref 12.0–15.0)
Immature Granulocytes: 1 %
Lymphocytes Relative: 4 %
Lymphs Abs: 0.3 10*3/uL — ABNORMAL LOW (ref 0.7–4.0)
MCH: 24.7 pg — ABNORMAL LOW (ref 26.0–34.0)
MCHC: 30 g/dL (ref 30.0–36.0)
MCV: 82.1 fL (ref 80.0–100.0)
Monocytes Absolute: 0.8 10*3/uL (ref 0.1–1.0)
Monocytes Relative: 10 %
Neutro Abs: 6.9 10*3/uL (ref 1.7–7.7)
Neutrophils Relative %: 85 %
Platelets: 182 10*3/uL (ref 150–400)
RBC: 2.96 MIL/uL — ABNORMAL LOW (ref 3.87–5.11)
RDW: 18.6 % — ABNORMAL HIGH (ref 11.5–15.5)
WBC: 8.1 10*3/uL (ref 4.0–10.5)
nRBC: 0 % (ref 0.0–0.2)

## 2019-02-15 LAB — BASIC METABOLIC PANEL
Anion gap: 10 (ref 5–15)
BUN: 59 mg/dL — ABNORMAL HIGH (ref 8–23)
CO2: 35 mmol/L — ABNORMAL HIGH (ref 22–32)
Calcium: 9 mg/dL (ref 8.9–10.3)
Chloride: 94 mmol/L — ABNORMAL LOW (ref 98–111)
Creatinine, Ser: 1.51 mg/dL — ABNORMAL HIGH (ref 0.44–1.00)
GFR calc Af Amer: 40 mL/min — ABNORMAL LOW (ref >60)
GFR calc non Af Amer: 34 mL/min — ABNORMAL LOW (ref >60)
Glucose, Bld: 189 mg/dL — ABNORMAL HIGH (ref 70–99)
Potassium: 4.3 mmol/L (ref 3.5–5.1)
Sodium: 139 mmol/L (ref 135–145)

## 2019-02-15 LAB — TYPE AND SCREEN
ABO/RH(D): B NEG
Antibody Screen: NEGATIVE

## 2019-02-15 LAB — GLUCOSE, CAPILLARY
Glucose-Capillary: 129 mg/dL — ABNORMAL HIGH (ref 70–99)
Glucose-Capillary: 226 mg/dL — ABNORMAL HIGH (ref 70–99)
Glucose-Capillary: 257 mg/dL — ABNORMAL HIGH (ref 70–99)
Glucose-Capillary: 214 mg/dL — ABNORMAL HIGH (ref 70–99)

## 2019-02-15 LAB — IRON AND TIBC
Iron: 19 ug/dL — ABNORMAL LOW (ref 28–170)
Saturation Ratios: 6 % — ABNORMAL LOW (ref 10.4–31.8)
TIBC: 307 ug/dL (ref 250–450)
UIBC: 288 ug/dL

## 2019-02-15 LAB — FERRITIN: Ferritin: 25 ng/mL (ref 11–307)

## 2019-02-15 NOTE — Progress Notes (Signed)
Seen by OT and assisted pt in the bedside commode for voiding, tolerated well. Purewick taken out by same who  recommended to use purewick at night time and off at daytime.

## 2019-02-15 NOTE — Progress Notes (Signed)
Patient ID: Amy Shepard, female   DOB: [birth year], 73 y.o.   MRN: [id number] Patient sitting up in bed eating breakfast comfortable this morning O2 sats in the high 80s on oxygen.  No new drainage in the wound VAC canister total of 150 cc.  Patient may be touchdown weightbearing on the right with her fracture boot in place.  Plan for removal of the wound VAC dressing prior to discharge

## 2019-02-15 NOTE — Progress Notes (Addendum)
Physical Therapy Treatment Patient Details Name: Amy Shepard MRN: [id number] DOB: 10/11/1945 Today's Date: 02/15/2019    History of Present Illness 73 yo 10wks s/p trimalleolar Rt ankle fx with syndesmotic disruption. 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 7/31 hardware removal with ankle fusion, wound VAC. PMHx: lung CA, CAD, spinal fusion, depression, CHF, pulmonary HTN, CKD    PT Comments    Pt very pleasant sitting with HOB elevated with SpO2 90% on HFNC 25 flow rate with FiO2 70% pt with noted desaturation to 85% with transition to EOB and drop to 76% with stand pivot. Pt returned to 89% seated in chair with drop again to 85% with bil LE HEp with pauses between reps and cues for breathing technique. Per notes and order from Dr.Duda 8/3 pt able to be WBAT for transfers only with CAM boot on and pt much prefers this transfer method stating that lateral scoot is very uncomfortable. Pt remains to not have assist at home and would require repeated transfers to care for self at home with SNF remaining appropriate recommendation to allow additional assist for appropriate ankle healing.    Follow Up Recommendations  SNF     Equipment Recommendations  None recommended by PT    Recommendations for Other Services       Precautions / Restrictions Precautions Precautions: Fall Precaution Comments: watch O2, VAC Required Braces or Orthoses: Other Brace Other Brace: CAM boot RLE Restrictions Weight Bearing Restrictions: Yes RLE Weight Bearing: Weight bearing as tolerated Other Position/Activity Restrictions: WBAT for transfer only with CAM boot    Mobility  Bed Mobility   Bed Mobility: Supine to Sit     Supine to sit: HOB elevated;Supervision     General bed mobility comments: HOB 35 degrees with use of rail and increased time to pivot to left side of bed  Transfers Overall transfer level: Needs assistance   Transfers: Sit to/from Stand;Stand  Pivot Transfers Sit to Stand: Min guard Stand pivot transfers: Min guard       General transfer comment: pt able to stand and pivot EOB with pivot to Rt with guarding for lines and safety with brief stance time and max assist for equipment setup  Ambulation/Gait             General Gait Details: unable per orders   Stairs             Wheelchair Mobility    Modified Rankin (Stroke Patients Only)       Balance Overall balance assessment: No apparent balance deficits (not formally assessed)                                          Cognition Arousal/Alertness: Awake/alert Behavior During Therapy: WFL for tasks assessed/performed Overall Cognitive Status: Within Functional Limits for tasks assessed                                        Exercises General Exercises - Lower Extremity Long Arc Quad: AROM;Both;Seated;15 reps Hip Flexion/Marching: AROM;Both;10 reps;Seated    General Comments        Pertinent Vitals/Pain Pain Assessment: No/denies pain    Home Living  Prior Function            PT Goals (current goals can now be found in the care plan section) Progress towards PT goals: Progressing toward goals    Frequency    Min 2X/week      PT Plan Current plan remains appropriate;Frequency needs to be updated    Co-evaluation              AM-PAC PT "6 Clicks" Mobility   Outcome Measure  Help needed turning from your back to your side while in a flat bed without using bedrails?: None Help needed moving from lying on your back to sitting on the side of a flat bed without using bedrails?: A Little Help needed moving to and from a bed to a chair (including a wheelchair)?: A Little Help needed standing up from a chair using your arms (e.g., wheelchair or bedside chair)?: A Little Help needed to walk in hospital room?: Total Help needed climbing 3-5 steps with a railing? :  Total 6 Click Score: 15    End of Session Equipment Utilized During Treatment: Oxygen;Other (comment)(RLE CAM boot) Activity Tolerance: Patient tolerated treatment well Patient left: in chair;with call bell/phone within reach Nurse Communication: Mobility status;Precautions;Weight bearing status PT Visit Diagnosis: Muscle weakness (generalized) (M62.81);Other abnormalities of gait and mobility (R26.89)     Time: 2836-6294 PT Time Calculation (min) (ACUTE ONLY): 32 min  Charges:  $Therapeutic Exercise: 8-22 mins $Therapeutic Activity: 8-22 mins                     Brissa Asante Pam Drown, PT Acute Rehabilitation Services Pager: [phone] Office: Agency 02/15/2019, 1:26 PM

## 2019-02-15 NOTE — Progress Notes (Signed)
PROGRESS NOTE    Amy Shepard  NOI:[id number] DOB: 02-Mar-1946 DOA: 02/11/2019 PCP: Maurice Small, MD    Brief Narrative:  73 year old female with extensive medical problems including chronic hypoxic respiratory failure on 4 L of oxygen at home, traumatic right ankle fracture with nonunion, chronic diastolic heart failure, pulmonary hypertension, lung cancer, CKD stage III, recent complicated postop course in the hospital who was brought as elective patient for nonhealing right trimalleolar ankle fracture and underwent hardware removal and fusion of the ankle.  Postoperative patient is lethargic and weak requiring increased amount of oxygen so medical consultation.  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  Her oxygen requirement has been exacerbated by postop stress.  No evidence of pneumonia.  No evidence of other complications. Treating for COPD with IV steroids and aggressive bronchodilator therapy.  Patient will need chest physiotherapy and incentive spirometry and aggressive pulmonary toileting.   She was given a dose of Lasix with good response,  now back to oral Lasix and she is euvolemic. Patient has pulmonary hypertension and pulmonary fibrosis and she is on multiple medications that she will continue. Keep on oxygen to keep saturations more than 90%.  She may take some time to recover from her increased oxygen requirement.  Continue to work with PT OT. Anticipate prolonged recovery and weaning off oxygen.  Type 2 diabetes: On insulin that she will continue.  Will taper off on the steroids.  Nonunion right ankle fracture: Postop surgical management as per surgery.  CKD stage III: With baseline creatinine about 1.5.  At about her normal levels.  Will need close monitoring.  Hypokalemia: Replace and monitor levels.  Check magnesium levels.  Anemia of chronic disease: 7.3. no indication for transfusion.  We will check her iron levels, if low will benefit with iron transfusion while in hospital.  We will continue to follow patient throughout her hospitalization.   DVT prophylaxis: Lovenox Code Status: Full code Family Communication: None Disposition Plan: As per surgery.  Anticipate SNF placement. Anticipating prolonged weaning from oxygen.   Procedures:   Right ankle surgery  Antimicrobials:   None   Subjective: Patient seen and examined.  No overnight events.  She remains on 15 L of high flow oxygen with 70% FiO2.  However she was comfortable.  Denies any wheezing chest pain or sputum production.  Remains afebrile.  She was eating breakfast.  Objective: Vitals:   02/15/19 0223 02/15/19 0300 02/15/19 0708 02/15/19 0715  BP:  (!) 103/48    Pulse: 74 78    Resp: 12 (!) 25    Temp:  98.4 F (36.9 C)    TempSrc:  Oral    SpO2: 90% 91% 93% 92%  Weight:  Height:        Intake/Output Summary (Last 24 hours) at 02/15/2019 0854 Last data filed at 02/15/2019 0400 Gross per 24 hour  Intake 600 ml  Output 1200 ml  Net -600 ml   Filed Weights   02/11/19 0553  Weight: 77.1 kg    Examination:  General exam:  Appears calm and comfortable, on high flow oxygen.  Chronically sick looking.  Not in any distress. Respiratory system: Clear to auscultation. Respiratory effort normal.  No added sounds. Cardiovascular system: S1 & S2 heard, RRR. No JVD, murmurs, rubs, gallops or clicks. No pedal edema. Gastrointestinal system: Abdomen is nondistended, soft and nontender. No organomegaly or masses felt. Normal bowel sounds heard. Central nervous system: Alert and oriented. No focal neurological deficits. Extremities: Symmetric 5 x 5 power.  Right ankle on postop compression dressing, not removed by me. Skin: No rashes, lesions or ulcers Psychiatry: Judgement and insight appear normal. Mood & affect appropriate.     Data Reviewed: I have personally reviewed following labs and imaging studies  CBC: Recent Labs  Lab 02/11/19 0603 02/13/19 1805 02/14/19 0744 02/15/19 0424  WBC 9.1 8.9 8.1 8.1  NEUTROABS  --   --  6.9 6.9  HGB 10.0* 7.8* 8.1* 7.3*  HCT 32.7* 25.5* 27.1* 24.3*  MCV 82.0 81.7 82.4 82.1  PLT 214 148* 150 951   Basic Metabolic Panel: Recent Labs  Lab 02/11/19 0603 02/13/19 1805 02/14/19 0744 02/15/19 0424  NA 142 141 141 139  K 3.6 4.0 3.3* 4.3  CL 102 99 96* 94*  CO2 28 32 35* 35*  GLUCOSE 92 198* 98 189*  BUN 53* 62* 55* 59*  CREATININE 1.51* 2.02* 1.63* 1.51*  CALCIUM 9.2 8.6* 8.8* 9.0   GFR: Estimated Creatinine Clearance: 32.4 mL/min (A) (by C-G formula based on SCr of 1.51 mg/dL (H)). Liver Function Tests: No results for input(s): AST, ALT, ALKPHOS, BILITOT, PROT, ALBUMIN in the last 168 hours. No results for input(s): LIPASE, AMYLASE in the last 168 hours. No results for input(s): AMMONIA in the last 168 hours. Coagulation Profile: No results for input(s): INR, PROTIME in the last 168 hours. Cardiac Enzymes: No results for input(s): CKTOTAL, CKMB, CKMBINDEX, TROPONINI in the last 168 hours. BNP (last 3 results) No results for input(s): PROBNP in the last 8760  hours. HbA1C: No results for input(s): HGBA1C in the last 72 hours. CBG: Recent Labs  Lab 02/14/19 0749 02/14/19 1051 02/14/19 1643 02/14/19 2116 02/15/19 0636  GLUCAP 87 159* 180* 257* 129*   Lipid Profile: No results for input(s): CHOL, HDL, LDLCALC, TRIG, CHOLHDL, LDLDIRECT in the last 72 hours. Thyroid Function Tests: No results for input(s): TSH, T4TOTAL, FREET4, T3FREE, THYROIDAB in the last 72 hours. Anemia Panel: No results for input(s): VITAMINB12, FOLATE, FERRITIN, TIBC, IRON, RETICCTPCT in the last 72 hours. Sepsis Labs: No results for input(s): PROCALCITON, LATICACIDVEN in the last 168 hours.  Recent Results (from the past 240 hour(s))  SARS Coronavirus 2 (Performed in [place] hospital lab)     Status: None   Collection Time: 02/09/19 11:37 AM   Specimen: Nasal Swab  Result Value Ref Range Status   SARS Coronavirus 2 NEGATIVE NEGATIVE Final    Comment: (NOTE) SARS-CoV-2 target nucleic acids are NOT DETECTED. The SARS-CoV-2 RNA is generally detectable in upper and lower respiratory specimens during the acute phase of infection. Negative results do not preclude SARS-CoV-2 infection, do not rule out co-infections with other pathogens, and should not be used as the  sole basis for treatment or other patient management decisions. Negative results must be combined with clinical observations, patient history, and epidemiological information. The expected result is Negative. Fact Sheet for Patients: SugarRoll.be Fact Sheet for Healthcare Providers: https://www.woods-mathews.com/ This test is not yet approved or cleared by the [place] FDA and  has been authorized for detection and/or diagnosis of SARS-CoV-2 by FDA under an Emergency Use Authorization (EUA). This EUA will remain  in effect (meaning this test can be used) for the duration of the COVID-19 declaration under Section 56 4(b)(1) of the Act, 21 U.S.C. section  360bbb-3(b)(1), unless the authorization is terminated or revoked sooner. Performed at [facility], [place] [address] [place], [place] [address]   Surgical pcr screen     Status: None   Collection Time: 02/11/19  6:07 AM   Specimen: Nasal Mucosa; Nasal Swab  Result Value Ref Range Status   MRSA, PCR NEGATIVE NEGATIVE Final   Staphylococcus aureus NEGATIVE NEGATIVE Final    Comment: (NOTE) The Xpert SA Assay (FDA approved for NASAL specimens in patients 51 years of age and older), is one component of a comprehensive surveillance program. It is not intended to diagnose infection nor to guide or monitor treatment. Performed at [facility], [place] [address] [place], [place] [address]   Aerobic/Anaerobic Culture (surgical/deep wound)     Status: None (Preliminary result)   Collection Time: 02/11/19  7:50 AM   Specimen: Soft Tissue, Other  Result Value Ref Range Status   Specimen Description TISSUE RIGHT ANKLE MEDIAL  Final   Special Requests NONE  Final   Gram Stain   Final    RARE WBC PRESENT, PREDOMINANTLY PMN NO ORGANISMS SEEN    Culture   Final    NO GROWTH 3 DAYS NO ANAEROBES ISOLATED; CULTURE IN PROGRESS FOR 5 DAYS Performed at [facility], [place] [address], [place], [place] [address]    Report Status PENDING  Incomplete  SARS Coronavirus 2 [facility] order, Performed in [facility] hospital lab) Nasopharyngeal Nasopharyngeal Swab     Status: None   Collection Time: 02/13/19  6:42 PM   Specimen: Nasopharyngeal Swab  Result Value Ref Range Status   SARS Coronavirus 2 NEGATIVE NEGATIVE Final    Comment: (NOTE) If result is NEGATIVE SARS-CoV-2 target nucleic acids are NOT DETECTED. The SARS-CoV-2 RNA is generally detectable in upper and lower  respiratory specimens during the acute phase of infection. The lowest  concentration of SARS-CoV-2 viral copies this assay can detect is 250  copies / mL. A negative result does not preclude SARS-CoV-2 infection  and  should not be used as the sole basis for treatment or other  patient management decisions.  A negative result may occur with  improper specimen collection / handling, submission of specimen other  than nasopharyngeal swab, presence of viral mutation(s) within the  areas targeted by this assay, and inadequate number of viral copies  (<250 copies / mL). A negative result must be combined with clinical  observations, patient history, and epidemiological information. If result is POSITIVE SARS-CoV-2 target nucleic acids are DETECTED. The SARS-CoV-2 RNA is generally detectable in upper and lower  respiratory specimens dur ing the acute phase of infection.  Positive  results are indicative of active infection with SARS-CoV-2.  Clinical  correlation with patient history and other diagnostic information is  necessary to determine patient infection status.  Positive results do  not rule out bacterial infection or co-infection with other viruses.  If result is PRESUMPTIVE POSTIVE SARS-CoV-2 nucleic acids MAY BE PRESENT.   A presumptive positive result was obtained on the submitted specimen  and confirmed on repeat testing.  While 2019 novel coronavirus  (SARS-CoV-2) nucleic acids may be present in the submitted sample  additional confirmatory testing may be necessary for epidemiological  and / or clinical management purposes  to differentiate between  SARS-CoV-2 and other Sarbecovirus currently known to infect humans.  If clinically indicated additional testing with an alternate test  methodology [phone]) is advised. The SARS-CoV-2 RNA is generally  detectable in upper and lower respiratory sp ecimens during the acute  phase of infection. The expected result is Negative. Fact Sheet for Patients:  StrictlyIdeas.no Fact Sheet for Healthcare Providers: BankingDealers.co.za This test is not yet approved or cleared by the [place] FDA and has been  authorized for detection and/or diagnosis of SARS-CoV-2 by FDA under an Emergency Use Authorization (EUA).  This EUA will remain in effect (meaning this test can be used) for the duration of the COVID-19 declaration under Section 564(b)(1) of the Act, 21 U.S.C. section 360bbb-3(b)(1), unless the authorization is terminated or revoked sooner. Performed at [facility], [place] [address], [place], [place] [address]          Radiology Studies: Dg Chest 1 View  Result Date: 02/13/2019 CLINICAL DATA:  Pt complains of difficulty breathing and SOB. Hx of pulmonary HTN and COPD. Pt in hospital for surgery to her ankle. EXAM: CHEST  1 VIEW COMPARISON:  12/10/18, CT 01/04/2019 FINDINGS: Enlarged cardiac silhouette. Lungs are hyperinflated. Bands of atelectasis similar comparison CT. Chronic bronchitic markings are present. New 7 mm RIGHT upper lobe pulmonary nodule. LEFT basilar atelectasis. No focal consolidation IMPRESSION: 1. New RIGHT upper lobe pulmonary nodule. Recommend follow-up radiograph or CT evaluation. 2. Cardiomegaly and LEFT lingular atelectasis. 3. Chronic bronchitic markings. Electronically Signed   By: Suzy Bouchard M.D.   On: 02/13/2019 18:04   Dg Chest Port 1 View  Result Date: 02/15/2019 CLINICAL DATA:  73 year old female with shortness of breath. Lung cancer. EXAM: PORTABLE CHEST 1 VIEW COMPARISON:  8220 and earlier. FINDINGS: Portable AP semi upright view at 0614 hours. Stable lung volumes. Stable cardiac size and mediastinal contours. Visualized tracheal air column is within normal limits. Increased dense left lung base opacity laterally, obscuring most of the left hemidiaphragm now. But at the same time bilateral increased pulmonary interstitial opacity appears regressed. Possible small right pleural effusion. No pneumothorax. Paucity of bowel gas in the upper abdomen. No acute osseous abnormality identified. IMPRESSION: 1. Worsening ventilation at the left lung base could  reflect progressive consolidation or pleural effusion. 2. At the same time there is regressed vascularity and/or interstitial markings since 02/13/2019. 3. Possible small right effusion. Electronically Signed   By: Genevie Ann M.D.   On: 02/15/2019 08:00        Scheduled Meds:  acetaminophen  650 mg Oral Q6H   atorvastatin  40 mg Oral q1800   azelastine  1 spray Each Nare BID   docusate sodium  100 mg Oral BID   furosemide  60 mg Oral BID   gabapentin  300 mg Oral TID   insulin aspart  0-20 Units Subcutaneous TID WC   insulin aspart  0-5 Units Subcutaneous QHS   insulin aspart  3 Units Subcutaneous TID WC   insulin detemir  30 Units Subcutaneous QHS   ipratropium-albuterol  3 mL Nebulization Q6H   iron polysaccharides  150 mg Oral  BID   macitentan  10 mg Oral Daily   mouth rinse  15 mL Mouth Rinse BID   methylPREDNISolone (SOLU-MEDROL) injection  40 mg Intravenous Q12H   midodrine  5 mg Oral TID WC   mometasone-formoterol  2 puff Inhalation BID   potassium chloride  20 mEq Oral BID   Selexipag  600 mcg Oral BID   sertraline  75 mg Oral Daily   traMADol  50 mg Oral Q6H   Continuous Infusions:   LOS: 4 days    Time spent: 25 minutes    Barb Merino, MD [facility] Pager [phone]  If 7PM-7AM, please contact night-coverage [email or web address] Password [facility] 02/15/2019, 8:54 AM

## 2019-02-15 NOTE — Progress Notes (Addendum)
Occupational Therapy Treatment Patient Details Name: Amy Shepard MRN: [id number] DOB: October 02, 1945 Today's Date: 02/15/2019    History of present illness 73 yo 10wks s/p trimalleolar Rt ankle fx with syndesmotic disruption. 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 7/31 hardware removal with ankle fusion, wound VAC. PMHx: lung CA, CAD, spinal fusion, depression, CHF, pulmonary HTN, CKD   OT comments  Patient seated in w/c upon entry.  Pt on 20 flow rate HFNC during session, SpO2 fluctuates throughout session when attention from PLB technique fades or patient engages in ADLs/transfers; dropping to 78% during initial transfer to [facility] and to 85% with second transfer back to w/c.  Patient requires forced rest breaks with focused PLB to bring O2 saturation back to 90-94%. Complete stand pivot transfer with min guard wearing CAM boot, mod assist required for toileting using lateral lean techniques.  Pt reports preference to use 3:1 commode vs pure wick for day time toileting needs and RN notified. Continue to recommend SNF rehab. Will follow.    Follow Up Recommendations  SNF    Equipment Recommendations  3 in 1 bedside commode    Recommendations for Other Services PT consult    Precautions / Restrictions Precautions Precautions: Fall Precaution Comments: watch O2, VAC Required Braces or Orthoses: Other Brace Other Brace: CAM boot RLE Restrictions Weight Bearing Restrictions: Yes RLE Weight Bearing: Weight bearing as tolerated Other Position/Activity Restrictions: WBAT for transfer only with CAM boot       Mobility Bed Mobility   Bed Mobility: Supine to Sit     Supine to sit: HOB elevated;Supervision     General bed mobility comments: OOB in wheelchair upon arrival  Transfers Overall transfer level: Needs assistance   Transfers: Sit to/from Stand;Stand Pivot Transfers Sit to Stand: Min guard Stand pivot transfers: Min guard       General  transfer comment: pt able to complete stand pivot transfer to/from [facility] with guarding assist, max assist for setup and mgmt of lines; cueing for PLB throughout transfers    Balance Overall balance assessment: No apparent balance deficits (not formally assessed)                                         ADL either performed or assessed with clinical judgement   ADL Overall ADL's : Needs assistance/impaired                         Toilet Transfer: Min Statistician Details (indicate cue type and reason): cueing for technique and safety  Toileting- Clothing Manipulation and Hygiene: Moderate assistance;Sitting/lateral lean Toileting - Clothing Manipulation Details (indicate cue type and reason): assist for clothing mgmt, pt able to complete hygiene with lateral leans      Functional mobility during ADLs: Min guard General ADL Comments: pt limited by decreased activity tolerance, cueing for PLB throughout session with forced rest breaks      Vision       Perception     Praxis      Cognition Arousal/Alertness: Awake/alert Behavior During Therapy: WFL for tasks assessed/performed Overall Cognitive Status: Within Functional Limits for tasks assessed  Exercises General Exercises - Lower Extremity Long Arc Quad: AROM;Both;Seated;15 reps Hip Flexion/Marching: AROM;Both;10 reps;Seated   Shoulder Instructions       General Comments discussed use of BSC for toileting needs, patient in agreement and RN aware; VSS during session with exception to SpO2 --on HFNC at 20L with O2 flucutating between 90-94 when completing PLB, dropping to 88 with decreased attention to breathing technique or during ADL/transfer to at times 78%--returned to 90% before therapist exit     Pertinent Vitals/ Pain       Pain Assessment: No/denies pain  Home Living                                           Prior Functioning/Environment              Frequency  Min 2X/week        Progress Toward Goals  OT Goals(current goals can now be found in the care plan section)  Progress towards OT goals: Progressing toward goals  Acute Rehab OT Goals Patient Stated Goal: to get strong enough to get home  OT Goal Formulation: With patient  Plan Discharge plan remains appropriate;Frequency remains appropriate    Co-evaluation                 AM-PAC OT "6 Clicks" Daily Activity     Outcome Measure   Help from another person eating meals?: A Little Help from another person taking care of personal grooming?: A Little Help from another person toileting, which includes using toliet, bedpan, or urinal?: A Lot Help from another person bathing (including washing, rinsing, drying)?: A Lot Help from another person to put on and taking off regular upper body clothing?: A Lot Help from another person to put on and taking off regular lower body clothing?: Total 6 Click Score: 13    End of Session Equipment Utilized During Treatment: Oxygen  OT Visit Diagnosis: Other abnormalities of gait and mobility (R26.89);Pain   Activity Tolerance Other (comment)(limited by oxygen saturations )   Patient Left in chair;with call bell/phone within reach   Nurse Communication Mobility status;Other (comment)(use of BSC)        Time: 1783-7542 OT Time Calculation (min): 29 min  Charges: OT General Charges $OT Visit: 1 Visit OT Treatments $Self Care/Home Management : 23-37 mins  Delight Stare, OT Acute Rehabilitation Services Pager [phone] Office [phone]    Delight Stare 02/15/2019, 4:50 PM

## 2019-02-15 NOTE — Care Management Important Message (Signed)
Important Message  Patient Details  Name: Amy Shepard MRN: [id number] Date of Birth: Dec 10, 1945   Medicare Important Message Given:  Yes     Memory Argue 02/15/2019, 1:29 PM   IM GIVEN TO PATIENT

## 2019-02-16 LAB — GLUCOSE, CAPILLARY
Glucose-Capillary: 253 mg/dL — ABNORMAL HIGH (ref 70–99)
Glucose-Capillary: 225 mg/dL — ABNORMAL HIGH (ref 70–99)

## 2019-02-16 LAB — BASIC METABOLIC PANEL
Anion gap: 12 (ref 5–15)
BUN: 61 mg/dL — ABNORMAL HIGH (ref 8–23)
CO2: 33 mmol/L — ABNORMAL HIGH (ref 22–32)
Calcium: 9.1 mg/dL (ref 8.9–10.3)
Chloride: 91 mmol/L — ABNORMAL LOW (ref 98–111)
Creatinine, Ser: 1.55 mg/dL — ABNORMAL HIGH (ref 0.44–1.00)
GFR calc Af Amer: 38 mL/min — ABNORMAL LOW (ref >60)
GFR calc non Af Amer: 33 mL/min — ABNORMAL LOW (ref >60)
Glucose, Bld: 323 mg/dL — ABNORMAL HIGH (ref 70–99)
Potassium: 4.5 mmol/L (ref 3.5–5.1)
Sodium: 136 mmol/L (ref 135–145)

## 2019-02-16 LAB — AEROBIC/ANAEROBIC CULTURE W GRAM STAIN (SURGICAL/DEEP WOUND): Culture: NO GROWTH

## 2019-02-16 LAB — CBC WITH DIFFERENTIAL/PLATELET
Abs Immature Granulocytes: 0.05 10*3/uL (ref 0.00–0.07)
Basophils Absolute: 0 10*3/uL (ref 0.0–0.1)
Basophils Relative: 0 %
Eosinophils Absolute: 0 10*3/uL (ref 0.0–0.5)
Eosinophils Relative: 0 %
HCT: 24.8 % — ABNORMAL LOW (ref 36.0–46.0)
Hemoglobin: 7.4 g/dL — ABNORMAL LOW (ref 12.0–15.0)
Immature Granulocytes: 1 %
Lymphocytes Relative: 3 %
Lymphs Abs: 0.2 10*3/uL — ABNORMAL LOW (ref 0.7–4.0)
MCH: 24.6 pg — ABNORMAL LOW (ref 26.0–34.0)
MCHC: 29.8 g/dL — ABNORMAL LOW (ref 30.0–36.0)
MCV: 82.4 fL (ref 80.0–100.0)
Monocytes Absolute: 0.4 10*3/uL (ref 0.1–1.0)
Monocytes Relative: 5 %
Neutro Abs: 7.5 10*3/uL (ref 1.7–7.7)
Neutrophils Relative %: 91 %
Platelets: 196 10*3/uL (ref 150–400)
RBC: 3.01 MIL/uL — ABNORMAL LOW (ref 3.87–5.11)
RDW: 18.6 % — ABNORMAL HIGH (ref 11.5–15.5)
WBC: 8.2 10*3/uL (ref 4.0–10.5)
nRBC: 0 % (ref 0.0–0.2)

## 2019-02-16 MED ORDER — FUROSEMIDE 10 MG/ML IJ SOLN
40.0000 mg | Freq: Two times a day (BID) | INTRAMUSCULAR | Status: DC
  Administered 2019-02-16 – 2019-02-18 (×5): 40 mg via INTRAVENOUS
  Filled 2019-02-16 (×5): qty 4

## 2019-02-16 NOTE — Social Work (Signed)
Pt still requiring HFNC.  CSW continuing to follow for support with disposition when medically appropriate.  Westley Hummer, MSW, [place] Work [phone]

## 2019-02-16 NOTE — Progress Notes (Signed)
Patient ID: Amy Shepard, female   DOB: Mar 17, 1946, 73 y.o.   MRN: [id number] Patient comfortable no complaints, oxygen saturation in 90s, no new drainage in [facility], patient felt comfortable with weight bearing for transfers, plan for SNF for discharge

## 2019-02-16 NOTE — NC FL2 (Signed)
[place] LEVEL OF CARE SCREENING TOOL     IDENTIFICATION  Patient Name: Amy Shepard Birthdate: 11/20/1945 Sex: female Admission Date (Current Location): 02/11/2019  [facility] and [place] Number:  Herbalist and Address:  [facility] [place], [place] [address] [place], [place] [address]      Provider Number: [id number]  Attending Physician Name and Address:  Newt Minion, MD  Relative Name and Phone Number:  Raymondo Band; daughter; [phone]    Current Level of Care: Hospital Recommended Level of Care: [place] Prior Approval Number:    Date Approved/Denied:   PASRR Number: [id number]  Discharge Plan: SNF    Current Diagnoses: Patient Active Problem List   Diagnosis Date Noted  . Right upper lobe pulmonary nodule 02/13/2019  . Closed right ankle fracture, with malunion, subsequent encounter 02/11/2019  . Charcot ankle, right   . Pain from implanted hardware   . SOB (shortness of breath)   . Acute on chronic systolic (congestive) heart failure ([place])   . 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 Diabetes mellitus type 2 in obese ([place])   . Supplemental oxygen dependent   . Trimalleolar fracture of ankle, closed, right, sequela 12/01/2018  . Hypoxia   . Acute on chronic anemia   . Closed dislocation of right talus   . Acute on chronic postoperative respiratory failure ([place])   . Postprocedural hypotension   . Trimalleolar fracture of ankle, closed, right, initial encounter   . Syndesmotic disruption of ankle, right, initial encounter   . Ankle fracture 11/22/2018  . Pain in joint, ankle and foot 04/10/2018  . Closed left fibular fracture 04/10/2018  . CKD (chronic kidney disease), stage IV ([place]) 04/09/2018  . Near syncope 04/09/2018  . Dyspnea   . Bronchitis, acute 03/11/2018  . Fatigue 03/11/2018  . Anemia due to stage 4 chronic  kidney disease ([place]) 03/11/2018  . Pulmonary fibrosis ([place]) 03/11/2018  . Pulmonary HTN ([place]) 03/11/2018  . Anemia 03/11/2018  . Lisfranc dislocation, left, subsequent encounter 06/08/2017  . Chronic respiratory failure with hypoxia ([place]) 06/03/2017  . Falls 06/03/2017  . Hypokalemia 06/03/2017  . Sepsis secondary to UTI ([place]) 06/02/2017  . Dog bite 05/02/2017  . CHF exacerbation ([place]) 05/02/2017  . Acute diastolic CHF (congestive heart failure) ([place]) 05/02/2017  . Chronic diastolic CHF (congestive heart failure) ([place]) 04/02/2017  . Cor pulmonale ([place]) 04/02/2017  . Acute on chronic respiratory failure ([place]) 03/10/2017  . Chronic pain 03/10/2017  . Lactic acidosis 02/10/2017  . Acute respiratory failure with hypoxia ([place]) 02/09/2017  . Acute on chronic diastolic CHF (congestive heart failure) ([place]) 06/23/2016  . HTN (hypertension) 06/23/2016  . Type 2 diabetes mellitus with complication, with long-term current use of insulin ([place]) 03/11/2016  . Depression 03/11/2016  . Chronic obstructive pulmonary disease ([place]) 03/10/2016  . S/P lumbar spinal fusion 02/22/2016  . Coronary artery disease involving native coronary artery 07/12/2014  . Pain in the chest   . Malignant neoplasm of lower lobe of right lung ([place]) 05/25/2014  . Lung cancer ([place]) 09/01/2012    Orientation RESPIRATION BLADDER Height & Weight     Self, Time, Situation, Place  O2 Continent Weight: 170 lb (77.1 kg) Height:  _0  (157.5 cm)  BEHAVIORAL SYMPTOMS/MOOD NEUROLOGICAL BOWEL NUTRITION STATUS      Continent Diet(see discharge summary)  AMBULATORY STATUS COMMUNICATION OF NEEDS Skin  Extensive Assist Verbally Surgical wounds, Skin abrasions(surgical incision on right ankle with compression wrap and Prevena wound vac; abrasion on buttocks and legs bilaterally)                       Personal Care Assistance Level of Assistance  Bathing, Dressing, Feeding Bathing Assistance: Maximum assistance Feeding  assistance: Limited assistance Dressing Assistance: Maximum assistance     Functional Limitations Info  Speech, Sight, Hearing Sight Info: Adequate Hearing Info: Adequate Speech Info: Adequate    SPECIAL CARE FACTORS FREQUENCY  OT (By licensed OT), PT (By licensed PT)     PT Frequency: 5x week OT Frequency: 5x week            Contractures Contractures Info: Not present    Additional Factors Info  Code Status, Allergies, Insulin Sliding Scale, Psychotropic Code Status Info: Full Code Allergies Info: Amoxicillin Psychotropic Info: sertraline (ZOLOFT) tablet 75 mg daily PO Insulin Sliding Scale Info: insulin aspart (novoLOG) injection 0-20 Units 3x daily with meals; insulin aspart (novoLOG) injection 0-5 Units daily at bedtime; insulin aspart (novoLOG) injection 3 Units 3x daily with meals; insulin detemir (LEVEMIR) injection 30 Units daily at bedtime       Current Medications (02/16/2019):  This is the current hospital active medication list Current Facility-Administered Medications  Medication Dose Route Frequency Provider Last Rate Last Dose  . acetaminophen (TYLENOL) tablet 650 mg  650 mg Oral Q6H Lady Deutscher, MD   650 mg at 02/16/19 0827  . albuterol (PROVENTIL) (2.5 MG/3ML) 0.083% nebulizer solution 2.5 mg  2.5 mg Nebulization Q2H PRN Lady Deutscher, MD      . atorvastatin (LIPITOR) tablet 40 mg  40 mg Oral q1800 Lady Deutscher, MD   40 mg at 02/15/19 1647  . azelastine (ASTELIN) 0.1 % nasal spray 1 spray  1 spray Each Nare BID Lady Deutscher, MD   1 spray at 02/16/19 [phone]  . docusate sodium (COLACE) capsule 100 mg  100 mg Oral BID Lady Deutscher, MD   100 mg at 02/16/19 0962  . furosemide (LASIX) injection 40 mg  40 mg Intravenous BID Barb Merino, MD   40 mg at 02/16/19 1000  . gabapentin (NEURONTIN) capsule 300 mg  300 mg Oral TID Lady Deutscher, MD   300 mg at 02/16/19 8366  . HYDROcodone-acetaminophen (NORCO/VICODIN) 5-325 MG per tablet 1  tablet  1 tablet Oral Q4H PRN Lady Deutscher, MD      . insulin aspart (novoLOG) injection 0-20 Units  0-20 Units Subcutaneous TID WC Lady Deutscher, MD   7 Units at 02/16/19 1232  . insulin aspart (novoLOG) injection 0-5 Units  0-5 Units Subcutaneous QHS Lady Deutscher, MD   2 Units at 02/15/19 2113  . insulin aspart (novoLOG) injection 3 Units  3 Units Subcutaneous TID WC Lady Deutscher, MD   3 Units at 02/16/19 1232  . insulin detemir (LEVEMIR) injection 30 Units  30 Units Subcutaneous QHS Lady Deutscher, MD   30 Units at 02/15/19 2113  . ipratropium-albuterol (DUONEB) 0.5-2.5 (3) MG/3ML nebulizer solution 3 mL  3 mL Nebulization Q6H Lady Deutscher, MD   3 mL at 02/16/19 1351  . iron polysaccharides (NIFEREX) capsule 150 mg  150 mg Oral BID Lady Deutscher, MD   150 mg at 02/16/19 2947  . macitentan (OPSUMIT) tablet 10 mg  10 mg Oral Daily Lady Deutscher, MD   10 mg at 02/16/19  6720  . MEDLINE mouth rinse  15 mL Mouth Rinse BID Newt Minion, MD   15 mL at 02/16/19 9198  . metoCLOPramide (REGLAN) tablet 5-10 mg  5-10 mg Oral Q8H PRN Lady Deutscher, MD       Or  . metoCLOPramide (REGLAN) injection 5-10 mg  5-10 mg Intravenous Q8H PRN Lady Deutscher, MD      . midodrine (PROAMATINE) tablet 5 mg  5 mg Oral TID WC Lady Deutscher, MD   5 mg at 02/16/19 1231  . mometasone-formoterol (DULERA) 200-5 MCG/ACT inhaler 2 puff  2 puff Inhalation BID Lady Deutscher, MD   2 puff at 02/16/19 [phone]  . morphine 2 MG/ML injection 0.5-1 mg  0.5-1 mg Intravenous Q2H PRN Lady Deutscher, MD      . ondansetron [facility]) tablet 4 mg  4 mg Oral Q6H PRN Lady Deutscher, MD       Or  . ondansetron [facility]) injection 4 mg  4 mg Intravenous Q6H PRN Lady Deutscher, MD   4 mg at 02/13/19 0133  . potassium chloride SA (K-DUR) CR tablet 20 mEq  20 mEq Oral BID Barb Merino, MD   20 mEq at 02/16/19 0827  . Selexipag Malvin Johns) 600 mg per 3 tablets - patient's own supply   600 mcg Oral BID Lady Deutscher, MD   600 mcg at 02/16/19 1000  . sertraline (ZOLOFT) tablet 75 mg  75 mg Oral Daily Lady Deutscher, MD   75 mg at 02/16/19 7981  . sodium chloride (OCEAN) 0.65 % nasal spray 1 spray  1 spray Each Nare PRN Lady Deutscher, MD      . traMADol Veatrice Bourbon) tablet 50 mg  50 mg Oral Q6H Lady Deutscher, MD   50 mg at 02/16/19 1235   Facility-Administered Medications Ordered in Other Encounters  Medication Dose Route Frequency Provider Last Rate Last Dose  . diatrizoate meglumine-sodium (GASTROGRAFIN) 66-10 % solution 30 mL  30 mL Oral PRN Bjorn Loser, MD   30 mL at 11/30/15 0820     Discharge Medications: Please see discharge summary for a list of discharge medications.  Relevant Imaging Results:  Relevant Lab Results:   Additional Information SS#[id number] [place], [place]

## 2019-02-16 NOTE — Progress Notes (Signed)
PROGRESS NOTE    Amy Shepard  KVQ:[id number] DOB: [birth year] DOA: 02/11/2019 PCP: Maurice Small, MD    Brief Narrative:  73 year old female with extensive medical problems including chronic hypoxic respiratory failure on 4 L of oxygen at home, traumatic right ankle fracture with nonunion, chronic diastolic heart failure, pulmonary hypertension, lung cancer, CKD stage III, recent complicated postop course in the hospital who was brought as elective patient for nonhealing right trimalleolar ankle fracture and underwent hardware removal and fusion of the ankle.  Postoperative patient is lethargic and weak requiring increased amount of oxygen so medical consultation.  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 pulmonary hypertension, baseline respiratory insufficiency and chronic diastolic heart failure.  Her oxygen requirement has been exacerbated by postop stress.  No evidence of pneumonia.  No evidence of other complications. Treating for COPD with IV steroids and aggressive bronchodilator therapy.  Patient will need chest physiotherapy and incentive spirometry and aggressive pulmonary toileting.   Her renal functions has stabilized, will try IV Lasix today. Patient has  pulmonary hypertension and pulmonary fibrosis and she is on multiple medications that she will continue. Keep on oxygen to keep saturations more than 90%.  She may take some time to recover from her increased oxygen requirement.  Continue to work with PT OT. Anticipate prolonged recovery and weaning off oxygen.  Type 2 diabetes: On insulin that she will continue.  Gradually taper off the steroids.  Nonunion right ankle fracture: Postop surgical management as per surgery.  CKD stage III: With baseline creatinine about 1.5.  At about her normal levels.  Will need close monitoring.  Hypokalemia: Replaced and normalized.  Anemia of chronic disease: 7.4. no indication for transfusion.  Iron and ferritin levels are normal.  We will continue to follow patient throughout her hospitalization.   DVT prophylaxis: Lovenox Code Status: Full code Family Communication: None Disposition Plan: As per surgery.  Anticipate SNF placement. Anticipating prolonged weaning from oxygen.   Procedures:   Right ankle surgery  Antimicrobials:   None   Subjective: No overnight events.  Remains on high flow oxygen.  She was able to get out of the bed to chair yesterday and needed about 20 L of oxygen. At rest, she does not feel discomfort though she is on 15 L oxygen. She was happy to be able to bear weight on right ankle.   Objective: Vitals:   02/16/19 0311 02/16/19 0712 02/16/19 0716 02/16/19 0816  BP: (!) 122/56   (!) 116/50  Pulse: 83   78  Resp: (!) 21   18  Temp: 98.1 F (36.7 C)   98.1 F (36.7 C)  TempSrc: Oral   Oral  SpO2:  96% 94% (!) 88%  Weight:      Height:        Intake/Output Summary (  Last 24 hours) at 02/16/2019 1039 Last data filed at 02/16/2019 0820 Gross per 24 hour  Intake 670 ml  Output 1650 ml  Net -980 ml   Filed Weights   02/11/19 0553  Weight: 77.1 kg    Examination:  General exam: Appears calm and comfortable, on high flow oxygen.  Chronically sick looking.   Not in any distress. Respiratory system: Clear to auscultation. Respiratory effort normal.  No added sounds. Cardiovascular system: S1 & S2 heard, RRR. No JVD, murmurs, rubs, gallops or clicks. No pedal edema. Gastrointestinal system: Abdomen is nondistended, soft and nontender. No organomegaly or masses felt. Normal bowel sounds heard. Central nervous system: Alert and oriented. No focal neurological deficits. Extremities: Symmetric 5 x 5 power.  Right ankle on postop dressing, not removed by me. Skin: No rashes, lesions or ulcers Psychiatry: Judgement and insight appear normal. Mood & affect appropriate.     Data Reviewed: I have personally reviewed following labs and imaging studies  CBC: Recent Labs  Lab 02/11/19 0603 02/13/19 1805 02/14/19 0744 02/15/19 0424 02/16/19 0232  WBC 9.1 8.9 8.1 8.1 8.2  NEUTROABS  --   --  6.9 6.9 7.5  HGB 10.0* 7.8* 8.1* 7.3* 7.4*  HCT 32.7* 25.5* 27.1* 24.3* 24.8*  MCV 82.0 81.7 82.4 82.1 82.4  PLT 214 148* 150 182 762   Basic Metabolic Panel: Recent Labs  Lab 02/11/19 0603 02/13/19 1805 02/14/19 0744 02/15/19 0424 02/16/19 0232  NA 142 141 141 139 136  K 3.6 4.0 3.3* 4.3 4.5  CL 102 99 96* 94* 91*  CO2 28 32 35* 35* 33*  GLUCOSE 92 198* 98 189* 323*  BUN 53* 62* 55* 59* 61*  CREATININE 1.51* 2.02* 1.63* 1.51* 1.55*  CALCIUM 9.2 8.6* 8.8* 9.0 9.1   GFR: Estimated Creatinine Clearance: 31.5 mL/min (A) (by C-G formula based on SCr of 1.55 mg/dL (H)). Liver Function Tests: No results for input(s): AST, ALT, ALKPHOS, BILITOT, PROT, ALBUMIN in the last 168 hours. No results for input(s): LIPASE, AMYLASE in the last 168 hours. No results for input(s): AMMONIA in the last 168 hours. Coagulation Profile: No results for input(s): INR, PROTIME in the last 168 hours. Cardiac Enzymes: No results for input(s): CKTOTAL, CKMB, CKMBINDEX, TROPONINI in the last 168 hours. BNP (last 3 results) No results for input(s): PROBNP in the last 8760  hours. HbA1C: No results for input(s): HGBA1C in the last 72 hours. CBG: Recent Labs  Lab 02/15/19 0636 02/15/19 1143 02/15/19 1635 02/15/19 2058 02/16/19 0610  GLUCAP 129* 226* 257* 214* 253*   Lipid Profile: No results for input(s): CHOL, HDL, LDLCALC, TRIG, CHOLHDL, LDLDIRECT in the last 72 hours. Thyroid Function Tests: No results for input(s): TSH, T4TOTAL, FREET4, T3FREE, THYROIDAB in the last 72 hours. Anemia Panel: Recent Labs    02/15/19 1012  FERRITIN 25  TIBC 307  IRON 19*   Sepsis Labs: No results for input(s): PROCALCITON, LATICACIDVEN in the last 168 hours.  Recent Results (from the past 240 hour(s))  SARS Coronavirus 2 (Performed in [place] hospital lab)     Status: None   Collection Time: 02/09/19 11:37 AM   Specimen: Nasal Swab  Result Value Ref Range Status   SARS Coronavirus 2 NEGATIVE NEGATIVE Final    Comment: (NOTE) SARS-CoV-2 target nucleic acids are NOT DETECTED. The SARS-CoV-2 RNA is generally detectable in upper and lower respiratory specimens during the acute phase of infection. Negative results do not preclude SARS-CoV-2 infection, do not rule out co-infections  with other pathogens, and should not be used as the sole basis for treatment or other patient management decisions. Negative results must be combined with clinical observations, patient history, and epidemiological information. The expected result is Negative. Fact Sheet for Patients: SugarRoll.be Fact Sheet for Healthcare Providers: https://www.woods-mathews.com/ This test is not yet approved or cleared by the [place] FDA and  has been authorized for detection and/or diagnosis of SARS-CoV-2 by FDA under an Emergency Use Authorization (EUA). This EUA will remain  in effect (meaning this test can be used) for the duration of the COVID-19 declaration under Section 56 4(b)(1) of the Act, 21 U.S.C. section 360bbb-3(b)(1), unless the  authorization is terminated or revoked sooner. Performed at [facility], [place] [address], [place], [place] [address]   Surgical pcr screen     Status: None   Collection Time: 02/11/19  6:07 AM   Specimen: Nasal Mucosa; Nasal Swab  Result Value Ref Range Status   MRSA, PCR NEGATIVE NEGATIVE Final   Staphylococcus aureus NEGATIVE NEGATIVE Final    Comment: (NOTE) The Xpert SA Assay (FDA approved for NASAL specimens in patients 48 years of age and older), is one component of a comprehensive surveillance program. It is not intended to diagnose infection nor to guide or monitor treatment. Performed at [facility], [place] [address], [place], [place] [address]   Aerobic/Anaerobic Culture (surgical/deep wound)     Status: None (Preliminary result)   Collection Time: 02/11/19  7:50 AM   Specimen: Soft Tissue, Other  Result Value Ref Range Status   Specimen Description TISSUE RIGHT ANKLE MEDIAL  Final   Special Requests NONE  Final   Gram Stain   Final    RARE WBC PRESENT, PREDOMINANTLY PMN NO ORGANISMS SEEN    Culture   Final    NO GROWTH 4 DAYS NO ANAEROBES ISOLATED; CULTURE IN PROGRESS FOR 5 DAYS Performed at [facility], [place] [address], [place],  [place] [address]    Report Status PENDING  Incomplete  SARS Coronavirus 2 [facility] order, Performed in [facility] hospital lab) Nasopharyngeal Nasopharyngeal Swab     Status: None   Collection Time: 02/13/19  6:42 PM   Specimen: Nasopharyngeal Swab  Result Value Ref Range Status   SARS Coronavirus 2 NEGATIVE NEGATIVE Final    Comment: (NOTE) If result is NEGATIVE SARS-CoV-2 target nucleic acids are NOT DETECTED. The SARS-CoV-2 RNA is generally detectable in upper and lower  respiratory specimens during the acute phase of infection. The lowest  concentration of SARS-CoV-2 viral copies this assay can detect is 250  copies / mL. A negative result does not preclude SARS-CoV-2 infection  and should not be used as the  sole basis for treatment or other  patient management decisions.  A negative result may occur with  improper specimen collection / handling, submission of specimen other  than nasopharyngeal swab, presence of viral mutation(s) within the  areas targeted by this assay, and inadequate number of viral copies  (<250 copies / mL). A negative result must be combined with clinical  observations, patient history, and epidemiological information. If result is POSITIVE SARS-CoV-2 target nucleic acids are DETECTED. The SARS-CoV-2 RNA is generally detectable in upper and lower  respiratory specimens dur ing the acute phase of infection.  Positive  results are indicative of active infection with SARS-CoV-2.  Clinical  correlation with patient history and other diagnostic information is  necessary to determine patient infection status.  Positive results do  not rule out bacterial infection or co-infection with other viruses. If result is PRESUMPTIVE POSTIVE SARS-CoV-2 nucleic acids MAY BE PRESENT.   A presumptive positive result was obtained on the submitted specimen  and confirmed on repeat testing.  While 2019 novel coronavirus  (SARS-CoV-2) nucleic acids may be present in the submitted sample  additional confirmatory testing may be necessary for epidemiological  and / or clinical management purposes  to differentiate between  SARS-CoV-2 and other Sarbecovirus currently known to infect humans.  If clinically indicated additional testing with an alternate test  methodology [phone]) is advised. The SARS-CoV-2 RNA is generally  detectable in upper and lower respiratory sp ecimens during the acute  phase of infection. The expected result is Negative. Fact Sheet for Patients:  StrictlyIdeas.no Fact Sheet for Healthcare Providers: BankingDealers.co.za This test is not yet approved or cleared by the [place] FDA and has been authorized for detection  and/or diagnosis of SARS-CoV-2 by FDA under an Emergency Use Authorization (EUA).  This EUA will remain in effect (meaning this test can be used) for the duration of the COVID-19 declaration under Section 564(b)(1) of the Act, 21 U.S.C. section 360bbb-3(b)(1), unless the authorization is terminated or revoked sooner. Performed at [facility], [place] [address] [place], [place] [address]          Radiology Studies: Dg Chest Floral 1 View  Result Date: 02/15/2019 CLINICAL DATA:  73 year old female with shortness of breath. Lung cancer. EXAM: PORTABLE CHEST 1 VIEW COMPARISON:  8220 and earlier. FINDINGS: Portable AP semi upright view at 0614 hours. Stable lung volumes. Stable cardiac size and mediastinal contours. Visualized tracheal air column is within normal limits. Increased dense left lung base opacity laterally, obscuring most of the left hemidiaphragm now. But at the same time bilateral increased pulmonary interstitial opacity appears regressed. Possible small right pleural effusion. No pneumothorax. Paucity of bowel gas in the upper abdomen. No acute osseous abnormality identified. IMPRESSION: 1. Worsening ventilation at the left lung base could reflect progressive consolidation or pleural effusion. 2. At the same time there is regressed vascularity and/or interstitial markings since 02/13/2019. 3. Possible small right effusion. Electronically Signed   By: Genevie Ann M.D.   On: 02/15/2019 08:00        Scheduled Meds: . acetaminophen  650 mg Oral Q6H  . atorvastatin  40 mg Oral q1800  . azelastine  1 spray Each Nare BID  . docusate sodium  100 mg Oral BID  . furosemide  40 mg Intravenous BID  . gabapentin  300 mg Oral TID  . insulin aspart  0-20 Units Subcutaneous TID WC  . insulin aspart  0-5 Units Subcutaneous QHS  . insulin aspart  3 Units Subcutaneous TID WC  . insulin detemir  30 Units Subcutaneous QHS  . ipratropium-albuterol  3 mL Nebulization Q6H  . iron  polysaccharides  150 mg Oral BID  . macitentan  10 mg Oral Daily  . mouth rinse  15 mL Mouth Rinse BID  . midodrine  5 mg Oral TID WC  . mometasone-formoterol  2 puff Inhalation BID  . potassium chloride  20 mEq Oral BID  . Selexipag  600 mcg Oral BID  . sertraline  75 mg Oral Daily  . traMADol  50 mg Oral Q6H   Continuous Infusions:   LOS: 5 days    Time spent: 25 minutes    Barb Merino, MD [facility] Pager [phone]  If 7PM-7AM, please contact night-coverage [email or web address] Password [email or web address]  02/16/2019, 10:39 AM

## 2019-02-17 LAB — GLUCOSE, CAPILLARY
Glucose-Capillary: 258 mg/dL — ABNORMAL HIGH (ref 70–99)
Glucose-Capillary: 195 mg/dL — ABNORMAL HIGH (ref 70–99)
Glucose-Capillary: 173 mg/dL — ABNORMAL HIGH (ref 70–99)
Glucose-Capillary: 122 mg/dL — ABNORMAL HIGH (ref 70–99)

## 2019-02-17 LAB — CBC WITH DIFFERENTIAL/PLATELET
Abs Immature Granulocytes: 0.05 10*3/uL (ref 0.00–0.07)
Basophils Absolute: 0 10*3/uL (ref 0.0–0.1)
Basophils Relative: 0 %
Eosinophils Absolute: 0.1 10*3/uL (ref 0.0–0.5)
Eosinophils Relative: 1 %
HCT: 24.5 % — ABNORMAL LOW (ref 36.0–46.0)
Hemoglobin: 7.3 g/dL — ABNORMAL LOW (ref 12.0–15.0)
Immature Granulocytes: 1 %
Lymphocytes Relative: 9 %
Lymphs Abs: 0.8 10*3/uL (ref 0.7–4.0)
MCH: 24.7 pg — ABNORMAL LOW (ref 26.0–34.0)
MCHC: 29.8 g/dL — ABNORMAL LOW (ref 30.0–36.0)
MCV: 83.1 fL (ref 80.0–100.0)
Monocytes Absolute: 1.1 10*3/uL — ABNORMAL HIGH (ref 0.1–1.0)
Monocytes Relative: 12 %
Neutro Abs: 6.9 10*3/uL (ref 1.7–7.7)
Neutrophils Relative %: 77 %
Platelets: 219 10*3/uL (ref 150–400)
RBC: 2.95 MIL/uL — ABNORMAL LOW (ref 3.87–5.11)
RDW: 18.8 % — ABNORMAL HIGH (ref 11.5–15.5)
WBC: 8.9 10*3/uL (ref 4.0–10.5)
nRBC: 0 % (ref 0.0–0.2)

## 2019-02-17 LAB — BLOOD GAS, ARTERIAL
Acid-Base Excess: 13.2 mmol/L — ABNORMAL HIGH (ref 0.0–2.0)
Bicarbonate: 38.1 mmol/L — ABNORMAL HIGH (ref 20.0–28.0)
Drawn by: 535271
FIO2: 50
O2 Content: 20 L/min
O2 Saturation: 74.2 %
Patient temperature: 98
pCO2 arterial: 55.2 mmHg — ABNORMAL HIGH (ref 32.0–48.0)
pH, Arterial: 7.451 — ABNORMAL HIGH (ref 7.350–7.450)
pO2, Arterial: 39.5 mmHg — CL (ref 83.0–108.0)

## 2019-02-17 LAB — BASIC METABOLIC PANEL
Anion gap: 10 (ref 5–15)
BUN: 61 mg/dL — ABNORMAL HIGH (ref 8–23)
CO2: 36 mmol/L — ABNORMAL HIGH (ref 22–32)
Calcium: 8.9 mg/dL (ref 8.9–10.3)
Chloride: 90 mmol/L — ABNORMAL LOW (ref 98–111)
Creatinine, Ser: 1.69 mg/dL — ABNORMAL HIGH (ref 0.44–1.00)
GFR calc Af Amer: 35 mL/min — ABNORMAL LOW (ref >60)
GFR calc non Af Amer: 30 mL/min — ABNORMAL LOW (ref >60)
Glucose, Bld: 202 mg/dL — ABNORMAL HIGH (ref 70–99)
Potassium: 4.2 mmol/L (ref 3.5–5.1)
Sodium: 136 mmol/L (ref 135–145)

## 2019-02-17 NOTE — Progress Notes (Signed)
Long discussion had w/patient, 2 RNs (Tiaria Biby and Alphia Kava) and RT regarding respiratory status.  Patient has progressively become more lethargic throughout the day.  RN has been trying to encourage pursed lip breathing to blow off CO2 but patient continues to fall asleep mid activity.  RT plans to obtain ABG.  Patient had previously denied the use of BiPAP; however, is now in agreement to use it as long as it is a temporary measure.  Will await ABGs to determine steps forward.

## 2019-02-17 NOTE — Progress Notes (Signed)
RT NOTE: RT obtained ABG and results are as follows: pH 7.45, PO2 39.5, PCO2 55.2, 38.1. RT increased FiO2 to 100% and RN is aware. RT will continue to monitor as needed.

## 2019-02-17 NOTE — Progress Notes (Signed)
PT Cancellation Note  Patient Details Name: Amy Shepard MRN: [id number] DOB: 05-29-1946   Cancelled Treatment:    Reason Eval/Treat Not Completed: Fatigue/lethargy limiting ability to participate; second attempt, pt lethargic though willing to participate.  Will attempt another day.   Reginia Naas 02/17/2019, 4:29 PM Magda Kiel, [place] [phone] 02/17/2019

## 2019-02-17 NOTE — Progress Notes (Signed)
Patient ID: Amy Shepard, female   DOB: 1945-11-05, 73 y.o.   MRN: [id number] Patient's O2 sats continue to improve.  There is no new drainage of the wound VAC canister.  Will discontinue the wound VAC prior to discharge to skilled nursing.

## 2019-02-17 NOTE — Plan of Care (Signed)
  Problem: Education: Goal: Knowledge of General Education information will improve Description: Including pain rating scale, medication(s)/side effects and non-pharmacologic comfort measures Outcome: Progressing

## 2019-02-17 NOTE — Progress Notes (Signed)
PT Cancellation Note  Patient Details Name: TANGELA DOLLIVER MRN: [id number] DOB: 20-Mar-1946   Cancelled Treatment:    Reason Eval/Treat Not Completed: Medical issues which prohibited therapy; patient reports needs to work on getting her breathing better before participating with therapy.  Will attempt later as able or another day.   Reginia Naas 02/17/2019, 1:08 PM  Magda Kiel, [place] [phone] 02/17/2019

## 2019-02-17 NOTE — Progress Notes (Signed)
PROGRESS NOTE    Amy Shepard  UVQ:[id number] DOB: 09/16/1945 DOA: 02/11/2019 PCP: Amy Small, MD    Brief Narrative:  73 year old female with extensive medical problems including chronic hypoxic respiratory failure on 4 L of oxygen at home, traumatic right ankle fracture with nonunion, chronic diastolic heart failure, pulmonary hypertension, lung cancer, CKD stage III, recent complicated postop course in the hospital who was brought as elective patient for nonhealing right trimalleolar ankle fracture and underwent hardware removal and fusion of the ankle.  Postoperative patient is lethargic and weak requiring increased amount of oxygen so medical consultation.  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 pulmonary hypertension, baseline respiratory insufficiency and chronic diastolic heart failure.  Her oxygen requirement has been exacerbated by postop stress.  No evidence of pneumonia.  No evidence of other complications. IV steroids and bronchodilators. Tolerated intravenous Lasix, will continue IV Lasix today. Keep on oxygen to keep saturations more than 88%. Continue to work with PT OT. Anticipate prolonged recovery and weaning off oxygen.  Type 2 diabetes: On  insulin that she will continue.  Stable.  Nonunion right ankle fracture: Postop surgical management as per surgery.  CKD stage III: With baseline creatinine about 1.5.  At about her normal levels.  Fluctuates on diuretics.  We will continue close monitoring.   Hypokalemia: Replaced and normalized.  Anemia of chronic disease: 7.4. no indication for transfusion.  Iron and ferritin levels are normal.  We will continue to follow patient throughout her hospitalization.   DVT prophylaxis: Lovenox Code Status: Full code Family Communication: None Disposition Plan: As per surgery.  Anticipate SNF placement. Anticipating prolonged weaning from oxygen.   Procedures:   Right ankle surgery  Antimicrobials:   None   Subjective: No overnight events.  Remains on high flow oxygen.  Patient is quite comfortable.  Though she is on high flow oxygen 15 to 20 L, she is not in any obvious distress.  No other overnight events.  Objective: Vitals:   02/17/19 0408 02/17/19 0719 02/17/19 0853 02/17/19 1106  BP: 118/74 99/61  (!) 110/55  Pulse: 69 63  67  Resp: _0 Temp: 98.4 F (36.9 C) 98.5 F (36.9 C)  98 F (36.7 C)  TempSrc: Oral Oral  Oral  SpO2: 92% 92% 91% 95%  Weight:      Height:        Intake/Output Summary (Last 24 hours) at 02/17/2019 1121 Last data filed at 02/17/2019 0945 Gross per 24 hour  Intake 2140 ml  Output 3350 ml  Net -1210 ml   Filed Weights   02/11/19 0553  Weight: 77.1 kg    Examination:  General exam: Appears calm and comfortable, on high flow oxygen.  Chronically sick looking.  Not in any distress. Respiratory system: Clear to auscultation. Respiratory  effort normal.  No added sounds. Cardiovascular system: S1 & S2 heard, RRR. No JVD, murmurs, rubs, gallops or clicks. No pedal edema. Gastrointestinal system: Abdomen is nondistended, soft and nontender. No organomegaly or masses felt. Normal bowel sounds heard. Central nervous system: Alert and  oriented. No focal neurological deficits. Extremities: Symmetric 5 x 5 power.  Right ankle on postop dressing, not removed by me. Skin: No rashes, lesions or ulcers Psychiatry: Judgement and insight appear normal. Mood & affect appropriate.     Data Reviewed: I have personally reviewed following labs and imaging studies  CBC: Recent Labs  Lab 02/13/19 1805 02/14/19 0744 02/15/19 0424 02/16/19 0232 02/17/19 0224  WBC 8.9 8.1 8.1 8.2 8.9  NEUTROABS  --  6.9 6.9 7.5 6.9  HGB 7.8* 8.1* 7.3* 7.4* 7.3*  HCT 25.5* 27.1* 24.3* 24.8* 24.5*  MCV 81.7 82.4 82.1 82.4 83.1  PLT 148* 150 182 196 196   Basic Metabolic Panel: Recent Labs  Lab 02/13/19 1805 02/14/19 0744 02/15/19 0424 02/16/19 0232 02/17/19 0224  NA 141 141 139 136 136  K 4.0 3.3* 4.3 4.5 4.2  CL 99 96* 94* 91* 90*  CO2 32 35* 35* 33* 36*  GLUCOSE 198* 98 189* 323* 202*  BUN 62* 55* 59* 61* 61*  CREATININE 2.02* 1.63* 1.51* 1.55* 1.69*  CALCIUM 8.6* 8.8* 9.0 9.1 8.9   GFR: Estimated Creatinine Clearance: 28.9 mL/min (A) (by C-G formula based on SCr of 1.69 mg/dL (H)). Liver Function Tests: No results for input(s): AST, ALT, ALKPHOS, BILITOT, PROT, ALBUMIN in the last 168 hours. No results for input(s): LIPASE, AMYLASE in the last 168 hours. No results for input(s): AMMONIA in the last 168 hours. Coagulation Profile: No results for input(s): INR, PROTIME in the last 168 hours. Cardiac Enzymes: No results for input(s): CKTOTAL, CKMB, CKMBINDEX, TROPONINI in the last 168 hours. BNP (last 3 results) No results for input(s): PROBNP in the last 8760 hours. HbA1C: No results for input(s): HGBA1C in the last 72 hours. CBG: Recent Labs  Lab 02/16/19 1143 02/16/19 1633 02/16/19 2122 02/17/19 0635 02/17/19 1113  GLUCAP 225* 258* 195* 173* 122*   Lipid Profile: No results for input(s): CHOL, HDL, LDLCALC, TRIG, CHOLHDL, LDLDIRECT in the last 72 hours. Thyroid Function Tests: No results for input(s): TSH,  T4TOTAL, FREET4, T3FREE, THYROIDAB in the last 72 hours. Anemia Panel: Recent Labs    02/15/19 1012  FERRITIN 25  TIBC 307  IRON 19*   Sepsis Labs: No results for input(s): PROCALCITON, LATICACIDVEN in the last 168 hours.  Recent Results (from the past 240 hour(s))  SARS Coronavirus 2 (Performed in [place] hospital lab)     Status: None   Collection Time: 02/09/19 11:37 AM   Specimen: Nasal Swab  Result Value Ref Range Status   SARS Coronavirus 2 NEGATIVE NEGATIVE Final    Comment: (NOTE) SARS-CoV-2 target nucleic acids are NOT DETECTED. The SARS-CoV-2 RNA is generally detectable in upper and lower respiratory specimens during the acute phase of infection. Negative results do not preclude SARS-CoV-2 infection, do not rule out co-infections with other pathogens, and should not be used as the sole basis for treatment or other patient management decisions. Negative results must be combined with clinical observations, patient history, and epidemiological information. The expected result is Negative. Fact Sheet for Patients: SugarRoll.be Fact Sheet for Healthcare Providers: https://www.woods-mathews.com/ This test is not yet approved or cleared by the [place] FDA and  has been authorized for detection and/or diagnosis of SARS-CoV-2  by FDA under an Emergency Use Authorization (EUA). This EUA will remain  in effect (meaning this test can be used) for the duration of the COVID-19 declaration under Section 56 4(b)(1) of the Act, 21 U.S.C. section 360bbb-3(b)(1), unless the authorization is terminated or revoked sooner. Performed at [facility], [place] [address] [place], [place] [address]   Surgical pcr screen     Status: None   Collection Time: 02/11/19  6:07 AM   Specimen: Nasal Mucosa; Nasal Swab  Result Value Ref Range Status   MRSA, PCR NEGATIVE NEGATIVE Final   Staphylococcus aureus NEGATIVE NEGATIVE Final     Comment: (NOTE) The Xpert SA Assay (FDA approved for NASAL specimens in patients 84 years of age and older), is one component of a comprehensive surveillance program. It is not intended to diagnose infection nor to guide or monitor treatment. Performed at [facility], [place] [address], [place], [place] [address]   Aerobic/Anaerobic Culture (surgical/deep wound)     Status: None   Collection Time: 02/11/19  7:50 AM   Specimen: Soft Tissue, Other  Result Value Ref Range Status   Specimen Description TISSUE RIGHT ANKLE MEDIAL  Final   Special Requests NONE  Final   Gram Stain   Final    RARE WBC PRESENT, PREDOMINANTLY PMN NO ORGANISMS SEEN    Culture   Final    No growth aerobically or anaerobically. Performed at [facility], [place] [address] [place], [place] [address]    Report Status 02/16/2019 FINAL  Final  SARS Coronavirus 2 [facility] order, Performed in [facility] hospital lab) Nasopharyngeal Nasopharyngeal Swab     Status: None   Collection Time: 02/13/19  6:42 PM   Specimen: Nasopharyngeal Swab  Result Value Ref Range Status   SARS Coronavirus 2 NEGATIVE NEGATIVE Final    Comment: (NOTE) If result is NEGATIVE SARS-CoV-2 target nucleic acids are NOT DETECTED. The SARS-CoV-2 RNA is generally detectable in upper and lower  respiratory specimens during the acute phase of infection. The lowest  concentration of SARS-CoV-2 viral copies this assay can detect is 250  copies / mL. A negative result does not preclude SARS-CoV-2 infection  and should not be used as the sole basis for treatment or other  patient management decisions.  A negative result may occur with  improper specimen collection / handling, submission of specimen other  than nasopharyngeal swab, presence of viral mutation(s) within the  areas targeted by this assay, and inadequate number of viral copies  (<250 copies / mL). A negative result must be combined with clinical  observations, patient history,  and epidemiological information. If result is POSITIVE SARS-CoV-2 target nucleic acids are DETECTED. The SARS-CoV-2 RNA is generally detectable in upper and lower  respiratory specimens dur ing the acute phase of infection.  Positive  results are indicative of active infection with SARS-CoV-2.  Clinical  correlation with patient history and other diagnostic information is  necessary to determine patient infection status.  Positive results do  not rule out bacterial infection or co-infection with other viruses. If result is PRESUMPTIVE POSTIVE SARS-CoV-2 nucleic acids MAY BE PRESENT.   A presumptive positive result was obtained on the submitted specimen  and confirmed on repeat testing.  While 2019 novel coronavirus  (SARS-CoV-2) nucleic acids may be present in the submitted sample  additional confirmatory testing may be necessary for epidemiological  and / or clinical management purposes  to differentiate between  SARS-CoV-2 and other Sarbecovirus currently  known to infect humans.  If clinically indicated additional testing with an alternate test  methodology [phone]) is advised. The SARS-CoV-2 RNA is generally  detectable in upper and lower respiratory sp ecimens during the acute  phase of infection. The expected result is Negative. Fact Sheet for Patients:  StrictlyIdeas.no Fact Sheet for Healthcare Providers: BankingDealers.co.za This test is not yet approved or cleared by the [place] FDA and has been authorized for detection and/or diagnosis of SARS-CoV-2 by FDA under an Emergency Use Authorization (EUA).  This EUA will remain in effect (meaning this test can be used) for the duration of the COVID-19 declaration under Section 564(b)(1) of the Act, 21 U.S.C. section 360bbb-3(b)(1), unless the authorization is terminated or revoked sooner. Performed at [facility], [place] [address] [place], [place] [address]           Radiology Studies: No results found.      Scheduled Meds: . acetaminophen  650 mg Oral Q6H  . atorvastatin  40 mg Oral q1800  . azelastine  1 spray Each Nare BID  . docusate sodium  100 mg Oral BID  . furosemide  40 mg Intravenous BID  . gabapentin  300 mg Oral TID  . insulin aspart  0-20 Units Subcutaneous TID WC  . insulin aspart  0-5 Units Subcutaneous QHS  . insulin aspart  3 Units Subcutaneous TID WC  . insulin detemir  30 Units Subcutaneous QHS  . ipratropium-albuterol  3 mL Nebulization Q6H  . iron polysaccharides  150 mg Oral BID  . macitentan  10 mg Oral Daily  . mouth rinse  15 mL Mouth Rinse BID  . midodrine  5 mg Oral TID WC  . mometasone-formoterol  2 puff Inhalation BID  . potassium chloride  20 mEq Oral BID  . Selexipag  600 mcg Oral BID  . sertraline  75 mg Oral Daily  . traMADol  50 mg Oral Q6H   Continuous Infusions:   LOS: 6 days    Time spent: 25 minutes    Barb Merino, MD [facility] Pager [phone]  If 7PM-7AM, please contact night-coverage [email or web address] Password [email or web address] 02/17/2019, 11:21 AM

## 2019-02-18 LAB — BASIC METABOLIC PANEL
Anion gap: 10 (ref 5–15)
BUN: 69 mg/dL — ABNORMAL HIGH (ref 8–23)
CO2: 36 mmol/L — ABNORMAL HIGH (ref 22–32)
Calcium: 8.8 mg/dL — ABNORMAL LOW (ref 8.9–10.3)
Chloride: 93 mmol/L — ABNORMAL LOW (ref 98–111)
Creatinine, Ser: 1.81 mg/dL — ABNORMAL HIGH (ref 0.44–1.00)
GFR calc Af Amer: 32 mL/min — ABNORMAL LOW (ref >60)
GFR calc non Af Amer: 27 mL/min — ABNORMAL LOW (ref >60)
Glucose, Bld: 174 mg/dL — ABNORMAL HIGH (ref 70–99)
Potassium: 4.4 mmol/L (ref 3.5–5.1)
Sodium: 139 mmol/L (ref 135–145)

## 2019-02-18 LAB — CBC WITH DIFFERENTIAL/PLATELET
Abs Immature Granulocytes: 0.07 10*3/uL (ref 0.00–0.07)
Basophils Absolute: 0 10*3/uL (ref 0.0–0.1)
Basophils Relative: 0 %
Eosinophils Absolute: 0.3 10*3/uL (ref 0.0–0.5)
Eosinophils Relative: 4 %
HCT: 25.1 % — ABNORMAL LOW (ref 36.0–46.0)
Hemoglobin: 7.4 g/dL — ABNORMAL LOW (ref 12.0–15.0)
Immature Granulocytes: 1 %
Lymphocytes Relative: 9 %
Lymphs Abs: 0.8 10*3/uL (ref 0.7–4.0)
MCH: 24.8 pg — ABNORMAL LOW (ref 26.0–34.0)
MCHC: 29.5 g/dL — ABNORMAL LOW (ref 30.0–36.0)
MCV: 84.2 fL (ref 80.0–100.0)
Monocytes Absolute: 1 10*3/uL (ref 0.1–1.0)
Monocytes Relative: 10 %
Neutro Abs: 7.4 10*3/uL (ref 1.7–7.7)
Neutrophils Relative %: 76 %
Platelets: 199 10*3/uL (ref 150–400)
RBC: 2.98 MIL/uL — ABNORMAL LOW (ref 3.87–5.11)
RDW: 18.8 % — ABNORMAL HIGH (ref 11.5–15.5)
WBC: 9.6 10*3/uL (ref 4.0–10.5)
nRBC: 0 % (ref 0.0–0.2)

## 2019-02-18 LAB — GLUCOSE, CAPILLARY
Glucose-Capillary: 125 mg/dL — ABNORMAL HIGH (ref 70–99)
Glucose-Capillary: 149 mg/dL — ABNORMAL HIGH (ref 70–99)
Glucose-Capillary: 118 mg/dL — ABNORMAL HIGH (ref 70–99)

## 2019-02-18 MED ORDER — FUROSEMIDE 40 MG PO TABS
60.0000 mg | ORAL_TABLET | Freq: Two times a day (BID) | ORAL | Status: DC
  Administered 2019-02-18 – 2019-02-21 (×6): 60 mg via ORAL
  Filled 2019-02-18 (×7): qty 1

## 2019-02-18 NOTE — Progress Notes (Addendum)
Subjective: 7 Days Post-Op Procedure(s) (LRB): REMOVAL DEEP HARDWARE RIGHT ANKLE (Right) RIGHT ANKLE FUSION (Right) Patient reports pain as mild.    Objective: Vital signs in last 24 hours: Temp:  [97.5 F (36.4 C)-98.3 F (36.8 C)] 97.6 F (36.4 C) (08/07 0723) Pulse Rate:  [67-80] 80 (08/07 0521) Resp:  [15-23] 15 (08/07 0521) BP: (84-116)/(41-64) 84/64 (08/07 0723) SpO2:  [88 %-100 %] 95 % (08/07 0809) FiO2 (%):  [50 %-100 %] 80 % (08/07 0809)  Intake/Output from previous day: 08/06 0701 - 08/07 0700 In: 940 [P.O.:940] Out: 1550 [Urine:1550] Intake/Output this shift: Total I/O In: 240 [P.O.:240] Out: -   Recent Labs    02/16/19 0232 02/17/19 0224 02/18/19 0240  HGB 7.4* 7.3* 7.4*   Recent Labs    02/17/19 0224 02/18/19 0240  WBC 8.9 9.6  RBC 2.95* 2.98*  HCT 24.5* 25.1*  PLT 219 199   Recent Labs    02/17/19 0224 02/18/19 0240  NA 136 139  K 4.2 4.4  CL 90* 93*  CO2 36* 36*  BUN 61* 69*  CREATININE 1.69* 1.81*  GLUCOSE 202* 174*  CALCIUM 8.9 8.8*   No results for input(s): LABPT, INR in the last 72 hours.  VAC dressing removed today and incisions are intact, minimal edema. No signs for infection or cellulitis.  Patient still on high flow FiO2(80%) and slowly progressing.    Assessment/Plan: 7 Days Post-Op Procedure(s) (LRB): REMOVAL DEEP HARDWARE RIGHT ANKLE (Right) RIGHT ANKLE FUSION (Right) S/P right ankle fusion- VAC removed and incisions are healing well.  Acute on chronic respiratory failure- Will have evaluated for possible LTAC admit as FiO2 needs too high for CIR, or SNF-Rehab at this point.   Erlinda Hong, PA-C 02/18/2019, 9:53 AM  [facility] [phone]

## 2019-02-18 NOTE — Progress Notes (Signed)
Pt claimed that wound vac removed by surgical md today

## 2019-02-18 NOTE — TOC Progression Note (Signed)
Transition of Care [facility]) - Progression Note    Patient Details  Name: Amy Shepard MRN: [id number] Date of Birth: 1945/12/14  Transition of Care [facility]) CM/SW [place], [place] Phone Number: 02/18/2019, 2:32 PM  Clinical Narrative:      CSW was consulted about the patient possibly needing LTAC. CSW  Advised PA, patient will need LTAC consult ordered  so they can be evaluated for LTAC.   Thurmond Butts, MSW, [place] Clinical Social Worker [phone]       Expected Discharge Plan and Services                                                 Social Determinants of Health (SDOH) Interventions    Readmission Risk Interventions Readmission Risk Prevention Plan 11/29/2018  Transportation Screening Complete  PCP or Specialist Appt within 3-5 Days Complete  HRI or [place] Complete  Social Work Consult for [place] Planning/Counseling Complete  Palliative Care Screening Not Applicable  Medication Review [employer]) Complete  Some recent data might be hidden

## 2019-02-18 NOTE — Progress Notes (Signed)
PROGRESS NOTE    Amy Shepard  GGY:[id number] DOB: 03-21-1946 DOA: 02/11/2019 PCP: Maurice Small, MD    Brief Narrative:  73 year old female with extensive medical problems including chronic hypoxic respiratory failure on 4 L of oxygen at home, traumatic right ankle fracture with nonunion, chronic diastolic heart failure, pulmonary hypertension, lung cancer, CKD stage III, recent complicated postop course in the hospital who was brought as elective patient for nonhealing right trimalleolar ankle fracture and underwent hardware removal and fusion of the ankle.  Postoperative patient was lethargic and weak requiring increased amount of oxygen so medical consultation.  8/8/ 2020, remains on high flow oxygen despite being optimized on medical therapy.  She will have prolonged weaning from high flow oxygen.  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 pulmonary hypertension, baseline respiratory insufficiency and chronic diastolic heart failure.  Her oxygen requirement has been exacerbated by postop stress.  No evidence of pneumonia.  No evidence of other complications. Was on IV steroids, will taper off to oral steroids. Treated with IV Lasix, she is euvolemic with not much  response, will change back to her oral Lasix. Keep on oxygen to keep saturations more than 88%. Continue to work with PT OT. Anticipate prolonged recovery and weaning off oxygen.  Type 2 diabetes: On insulin that she will continue.  Stable.  Nonunion right ankle fracture: Postop surgical management as per surgery.  CKD stage III: With baseline creatinine about 1.5.  Fluctuates on diuretics.  Will need close monitoring.     Hypokalemia: Replaced and normalized.  Anemia of chronic disease: 7.3. no indication for transfusion.  Iron and ferritin levels are normal.  We will continue to follow patient throughout her hospitalization.  Discussed with surgery team at the bedside.   DVT prophylaxis: Lovenox Code Status: Full code Family Communication: None Disposition Plan: As per surgery.  Anticipate LTAC versus SNF on discharge. Anticipating prolonged weaning from oxygen.   Procedures:   Right ankle surgery  Antimicrobials:   None   Subjective: No overnight events.  She was lethargic yesterday evening, stayed on 20 L high flow overnight.  No other events.  Though she is on high flow oxygen, she looks fairly comfortable today.  Objective: Vitals:   02/18/19 0500 02/18/19 0521 02/18/19 0723 02/18/19 0809  BP: (!) 97/49  (!) 84/64   Pulse: 73 80    Resp: 17 15    Temp: (!) 97.5 F (36.4 C)  97.6 F (36.4 C)   TempSrc: Oral  Oral   SpO2: 95% 93%  95%  Weight:      Height:        Intake/Output Summary (Last 24 hours) at 02/18/2019 1058 Last data filed at 02/18/2019 0900 Gross per 24 hour  Intake 960 ml  Output 800 ml  Net 160 ml  Filed Weights   02/11/19 0553  Weight: 77.1 kg    Examination:  General exam: Appears calm and comfortable, on high flow oxygen.  Chronically sick looking.  Not in any distress. Respiratory system: Clear to auscultation. Respiratory effort normal.  No added sounds. Cardiovascular system: S1 & S2 heard, RRR. No JVD, murmurs, rubs, gallops  or clicks. No pedal edema. Gastrointestinal system: Abdomen is nondistended, soft and nontender. No organomegaly or masses felt. Normal bowel sounds heard. Central nervous system: Alert and oriented. No focal neurological deficits. Extremities: Symmetric 5 x 5 power.  Right ankle dressing was removed by surgery team at the bedside, incisions clean and dry.  Sutures intact. Skin: No rashes, lesions or ulcers Psychiatry: Judgement and insight appear normal. Mood & affect appropriate.     Data Reviewed: I have personally reviewed following labs and imaging studies  CBC: Recent Labs  Lab 02/14/19 0744 02/15/19 0424 02/16/19 0232 02/17/19 0224 02/18/19 0240  WBC 8.1 8.1 8.2 8.9 9.6  NEUTROABS 6.9 6.9 7.5 6.9 7.4  HGB 8.1* 7.3* 7.4* 7.3* 7.4*  HCT 27.1* 24.3* 24.8* 24.5* 25.1*  MCV 82.4 82.1 82.4 83.1 84.2  PLT 150 182 196 219 509   Basic Metabolic Panel: Recent Labs  Lab 02/14/19 0744 02/15/19 0424 02/16/19 0232 02/17/19 0224 02/18/19 0240  NA 141 139 136 136 139  K 3.3* 4.3 4.5 4.2 4.4  CL 96* 94* 91* 90* 93*  CO2 35* 35* 33* 36* 36*  GLUCOSE 98 189* 323* 202* 174*  BUN 55* 59* 61* 61* 69*  CREATININE 1.63* 1.51* 1.55* 1.69* 1.81*  CALCIUM 8.8* 9.0 9.1 8.9 8.8*   GFR: Estimated Creatinine Clearance: 27 mL/min (A) (by C-G formula based on SCr of 1.81 mg/dL (H)). Liver Function Tests: No results for input(s): AST, ALT, ALKPHOS, BILITOT, PROT, ALBUMIN in the last 168 hours. No results for input(s): LIPASE, AMYLASE in the last 168 hours. No results for input(s): AMMONIA in the last 168 hours. Coagulation Profile: No results for input(s): INR, PROTIME in the last 168 hours. Cardiac Enzymes: No results for input(s): CKTOTAL, CKMB, CKMBINDEX, TROPONINI in the last 168 hours. BNP (last 3 results) No results for input(s): PROBNP in the last 8760 hours. HbA1C: No results for input(s): HGBA1C in the last 72 hours. CBG: Recent Labs  Lab 02/17/19 0635 02/17/19 1113  02/17/19 1619 02/17/19 2147 02/18/19 0635  GLUCAP 173* 122* 125* 149* 118*   Lipid Profile: No results for input(s): CHOL, HDL, LDLCALC, TRIG, CHOLHDL, LDLDIRECT in the last 72 hours. Thyroid Function Tests: No results for input(s): TSH, T4TOTAL, FREET4, T3FREE, THYROIDAB in the last 72 hours. Anemia Panel: No results for input(s): VITAMINB12, FOLATE, FERRITIN, TIBC, IRON, RETICCTPCT in the last 72 hours. Sepsis Labs: No results for input(s): PROCALCITON, LATICACIDVEN in the last 168 hours.  Recent Results (from the past 240 hour(s))  SARS Coronavirus 2 (Performed in [place] hospital lab)     Status: None   Collection Time: 02/09/19 11:37 AM   Specimen: Nasal Swab  Result Value Ref Range Status   SARS Coronavirus 2 NEGATIVE NEGATIVE Final    Comment: (NOTE) SARS-CoV-2 target nucleic acids are NOT DETECTED. The SARS-CoV-2 RNA is generally detectable in upper and lower respiratory specimens during the acute phase of infection. Negative results do not preclude SARS-CoV-2 infection, do not rule out co-infections with other pathogens, and should not be used as the sole basis for treatment or other patient management decisions. Negative results must be combined with clinical observations, patient  history, and epidemiological information. The expected result is Negative. Fact Sheet for Patients: SugarRoll.be Fact Sheet for Healthcare Providers: https://www.woods-mathews.com/ This test is not yet approved or cleared by the [place] FDA and  has been authorized for detection and/or diagnosis of SARS-CoV-2 by FDA under an Emergency Use Authorization (EUA). This EUA will remain  in effect (meaning this test can be used) for the duration of the COVID-19 declaration under Section 56 4(b)(1) of the Act, 21 U.S.C. section 360bbb-3(b)(1), unless the authorization is terminated or revoked sooner. Performed at [facility], [place]  [address] [place], [place] [address]   Surgical pcr screen     Status: None   Collection Time: 02/11/19  6:07 AM   Specimen: Nasal Mucosa; Nasal Swab  Result Value Ref Range Status   MRSA, PCR NEGATIVE NEGATIVE Final   Staphylococcus aureus NEGATIVE NEGATIVE Final    Comment: (NOTE) The Xpert SA Assay (FDA approved for NASAL specimens in patients 78 years of age and older), is one component of a comprehensive surveillance program. It is not intended to diagnose infection nor to guide or monitor treatment. Performed at [facility], [place] [address], [place], [place] [address]   Aerobic/Anaerobic Culture (surgical/deep wound)     Status: None   Collection Time: 02/11/19  7:50 AM   Specimen: Soft Tissue, Other  Result Value Ref Range Status   Specimen Description TISSUE RIGHT ANKLE MEDIAL  Final   Special Requests NONE  Final   Gram Stain   Final    RARE WBC PRESENT, PREDOMINANTLY PMN NO ORGANISMS SEEN    Culture   Final    No growth aerobically or anaerobically. Performed at [facility], [place] [address], [place], [place] [address]    Report Status 02/16/2019 FINAL  Final  SARS Coronavirus 2 [facility] order, Performed in [facility] hospital lab) Nasopharyngeal Nasopharyngeal Swab     Status: None   Collection Time: 02/13/19  6:42 PM   Specimen: Nasopharyngeal Swab  Result Value Ref Range Status   SARS Coronavirus 2 NEGATIVE NEGATIVE Final    Comment: (NOTE) If result is NEGATIVE SARS-CoV-2 target nucleic acids are NOT DETECTED. The SARS-CoV-2 RNA is generally detectable in upper and lower  respiratory specimens during the acute phase of infection. The lowest  concentration of SARS-CoV-2 viral copies this assay can detect is 250  copies / mL. A negative result does not preclude SARS-CoV-2 infection  and should not be used as the sole basis for treatment or other  patient management decisions.  A negative result may occur with  improper specimen collection / handling,  submission of specimen other  than nasopharyngeal swab, presence of viral mutation(s) within the  areas targeted by this assay, and inadequate number of viral copies  (<250 copies / mL). A negative result must be combined with clinical  observations, patient history, and epidemiological information. If result is POSITIVE SARS-CoV-2 target nucleic acids are DETECTED. The SARS-CoV-2 RNA is generally detectable in upper and lower  respiratory specimens dur ing the acute phase of infection.  Positive  results are indicative of active infection with SARS-CoV-2.  Clinical  correlation with patient history and other diagnostic information is  necessary to determine patient infection status.  Positive results do  not rule out bacterial infection or co-infection with other viruses. If result is PRESUMPTIVE POSTIVE SARS-CoV-2 nucleic acids MAY BE PRESENT.   A presumptive positive result was obtained on the submitted specimen  and confirmed on  repeat testing.  While 2019 novel coronavirus  (SARS-CoV-2) nucleic acids may be present in the submitted sample  additional confirmatory testing may be necessary for epidemiological  and / or clinical management purposes  to differentiate between  SARS-CoV-2 and other Sarbecovirus currently known to infect humans.  If clinically indicated additional testing with an alternate test  methodology [phone]) is advised. The SARS-CoV-2 RNA is generally  detectable in upper and lower respiratory sp ecimens during the acute  phase of infection. The expected result is Negative. Fact Sheet for Patients:  StrictlyIdeas.no Fact Sheet for Healthcare Providers: BankingDealers.co.za This test is not yet approved or cleared by the [place] FDA and has been authorized for detection and/or diagnosis of SARS-CoV-2 by FDA under an Emergency Use Authorization (EUA).  This EUA will remain in effect (meaning this test can be  used) for the duration of the COVID-19 declaration under Section 564(b)(1) of the Act, 21 U.S.C. section 360bbb-3(b)(1), unless the authorization is terminated or revoked sooner. Performed at [facility], [place] [address] [place], [place] [address]          Radiology Studies: No results found.      Scheduled Meds: . acetaminophen  650 mg Oral Q6H  . atorvastatin  40 mg Oral q1800  . azelastine  1 spray Each Nare BID  . docusate sodium  100 mg Oral BID  . furosemide  60 mg Oral BID  . gabapentin  300 mg Oral TID  . insulin aspart  0-20 Units Subcutaneous TID WC  . insulin aspart  0-5 Units Subcutaneous QHS  . insulin aspart  3 Units Subcutaneous TID WC  . insulin detemir  30 Units Subcutaneous QHS  . ipratropium-albuterol  3 mL Nebulization Q6H  . iron polysaccharides  150 mg Oral BID  . macitentan  10 mg Oral Daily  . mouth rinse  15 mL Mouth Rinse BID  . midodrine  5 mg Oral TID WC  . mometasone-formoterol  2 puff Inhalation BID  . potassium chloride  20 mEq Oral BID  . Selexipag  600 mcg Oral BID  . sertraline  75 mg Oral Daily  . traMADol  50 mg Oral Q6H   Continuous Infusions:   LOS: 7 days    Time spent: 25 minutes    Barb Merino, MD [facility] Pager [phone]  If 7PM-7AM, please contact night-coverage [email or web address] Password [email or web address] 02/18/2019, 10:58 AM

## 2019-02-18 NOTE — Plan of Care (Signed)

## 2019-02-18 NOTE — Progress Notes (Signed)
No overnight events, patient still on HFNC 20L.

## 2019-02-19 LAB — BASIC METABOLIC PANEL
Anion gap: 10 (ref 5–15)
BUN: 63 mg/dL — ABNORMAL HIGH (ref 8–23)
CO2: 35 mmol/L — ABNORMAL HIGH (ref 22–32)
Calcium: 8.8 mg/dL — ABNORMAL LOW (ref 8.9–10.3)
Chloride: 93 mmol/L — ABNORMAL LOW (ref 98–111)
Creatinine, Ser: 1.66 mg/dL — ABNORMAL HIGH (ref 0.44–1.00)
GFR calc Af Amer: 35 mL/min — ABNORMAL LOW (ref >60)
GFR calc non Af Amer: 30 mL/min — ABNORMAL LOW (ref >60)
Glucose, Bld: 143 mg/dL — ABNORMAL HIGH (ref 70–99)
Potassium: 4.1 mmol/L (ref 3.5–5.1)
Sodium: 138 mmol/L (ref 135–145)

## 2019-02-19 LAB — CBC WITH DIFFERENTIAL/PLATELET
Abs Immature Granulocytes: 0.05 10*3/uL (ref 0.00–0.07)
Basophils Absolute: 0 10*3/uL (ref 0.0–0.1)
Basophils Relative: 0 %
Eosinophils Absolute: 0.4 10*3/uL (ref 0.0–0.5)
Eosinophils Relative: 5 %
HCT: 24.2 % — ABNORMAL LOW (ref 36.0–46.0)
Hemoglobin: 7.3 g/dL — ABNORMAL LOW (ref 12.0–15.0)
Immature Granulocytes: 1 %
Lymphocytes Relative: 8 %
Lymphs Abs: 0.7 10*3/uL (ref 0.7–4.0)
MCH: 24.7 pg — ABNORMAL LOW (ref 26.0–34.0)
MCHC: 30.2 g/dL (ref 30.0–36.0)
MCV: 82 fL (ref 80.0–100.0)
Monocytes Absolute: 0.8 10*3/uL (ref 0.1–1.0)
Monocytes Relative: 10 %
Neutro Abs: 6.7 10*3/uL (ref 1.7–7.7)
Neutrophils Relative %: 76 %
Platelets: 186 10*3/uL (ref 150–400)
RBC: 2.95 MIL/uL — ABNORMAL LOW (ref 3.87–5.11)
RDW: 18.5 % — ABNORMAL HIGH (ref 11.5–15.5)
WBC: 8.8 10*3/uL (ref 4.0–10.5)
nRBC: 0 % (ref 0.0–0.2)

## 2019-02-19 NOTE — Progress Notes (Signed)
PROGRESS NOTE    Amy Shepard  CHY:[id number] DOB: 1946/04/14 DOA: 02/11/2019 PCP: Maurice Small, MD    Brief Narrative:  73 year old female with extensive medical problems including chronic hypoxic respiratory failure on 4 L of oxygen at home, traumatic right ankle fracture with nonunion, chronic diastolic heart failure, pulmonary hypertension, lung cancer, CKD stage III, recent complicated postop course in the hospital who was brought as elective patient for nonhealing right trimalleolar ankle fracture and underwent hardware removal and fusion of the ankle.  Postoperative patient was lethargic and weak requiring increased amount of oxygen so medical consultation.  8/8/ 2020, remains on high flow oxygen despite being optimized on medical therapy.  She will have prolonged weaning from high flow oxygen.  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 pulmonary hypertension, baseline respiratory insufficiency and chronic diastolic heart failure.  Her oxygen requirement has been exacerbated by postop stress.  No evidence of pneumonia.  No evidence of other complications. Tapered off . Treated with IV Lasix, she is euvolemic with not much response, will change back to her oral  Lasix. Keep on oxygen to keep saturations more than 88%. Continue to work with PT OT. Anticipate prolonged recovery and weaning off oxygen.  Type 2 diabetes: On insulin that she will continue.  Stable.  Nonunion right ankle fracture: Postop surgical management as per surgery.  CKD stage III: With baseline creatinine about 1.5.  Fluctuates on diuretics.  Stabilizing.     Hypokalemia: Replaced and normalized.  Anemia of chronic disease: 7.3. no indication for transfusion.  Iron and ferritin levels are normal.  We will continue to follow patient throughout her hospitalization.  She will [facility] need [facility] to [facility] as she is on high oxygen.  DVT prophylaxis: Lovenox Code Status: Full code Family Communication: None Disposition Plan: As per surgery.  Anticipate LTAC versus SNF on discharge. Anticipating prolonged weaning from oxygen.   Procedures:   Right ankle surgery  Antimicrobials:   None   Subjective: No overnight events.  Remains on high flow oxygen but looks very comfortable.  Objective: Vitals:   02/19/19 0745 02/19/19 0851 02/19/19 0852 02/19/19 1025  BP: (!) 91/56     Pulse:  71    Resp:  14    Temp: 98.1 F (36.7 C)   97.6 F (36.4 C)  TempSrc: Oral   Oral  SpO2:  91% 91%   Weight:      Height:        Intake/Output Summary (Last 24 hours) at 02/19/2019 1030 Last data filed at 02/19/2019 1026 Gross per 24 hour  Intake 920 ml  Output 2600 ml  Net -1680 ml   Filed Weights   02/11/19 0553  Weight: 77.1 kg    Examination:  General exam: Appears calm and comfortable, on high flow  oxygen.  Chronically sick looking.  Not in any distress. Respiratory system: Clear to auscultation. Respiratory effort normal.  No added sounds. Cardiovascular system: S1 & S2 heard, RRR. No JVD, murmurs, rubs, gallops or clicks. No pedal edema. Gastrointestinal system: Abdomen is nondistended, soft and nontender. No organomegaly or masses felt. Normal bowel sounds  heard. Central nervous system: Alert and oriented. No focal neurological deficits. Extremities: Symmetric 5 x 5 power.  Right ankle with surgical dressing.  Clean and intact.   Skin: No rashes, lesions or ulcers Psychiatry: Judgement and insight appear normal. Mood & affect appropriate.     Data Reviewed: I have personally reviewed following labs and imaging studies  CBC: Recent Labs  Lab 02/15/19 0424 02/16/19 0232 02/17/19 0224 02/18/19 0240 02/19/19 0229  WBC 8.1 8.2 8.9 9.6 8.8  NEUTROABS 6.9 7.5 6.9 7.4 6.7  HGB 7.3* 7.4* 7.3* 7.4* 7.3*  HCT 24.3* 24.8* 24.5* 25.1* 24.2*  MCV 82.1 82.4 83.1 84.2 82.0  PLT 182 196 219 199 350   Basic Metabolic Panel: Recent Labs  Lab 02/15/19 0424 02/16/19 0232 02/17/19 0224 02/18/19 0240 02/19/19 0229  NA 139 136 136 139 138  K 4.3 4.5 4.2 4.4 4.1  CL 94* 91* 90* 93* 93*  CO2 35* 33* 36* 36* 35*  GLUCOSE 189* 323* 202* 174* 143*  BUN 59* 61* 61* 69* 63*  CREATININE 1.51* 1.55* 1.69* 1.81* 1.66*  CALCIUM 9.0 9.1 8.9 8.8* 8.8*   GFR: Estimated Creatinine Clearance: 29.5 mL/min (A) (by C-G formula based on SCr of 1.66 mg/dL (H)). Liver Function Tests: No results for input(s): AST, ALT, ALKPHOS, BILITOT, PROT, ALBUMIN in the last 168 hours. No results for input(s): LIPASE, AMYLASE in the last 168 hours. No results for input(s): AMMONIA in the last 168 hours. Coagulation Profile: No results for input(s): INR, PROTIME in the last 168 hours. Cardiac Enzymes: No results for input(s): CKTOTAL, CKMB, CKMBINDEX, TROPONINI in the last 168 hours. BNP (last 3 results) No results for input(s): PROBNP in the last 8760 hours. HbA1C: No results for input(s): HGBA1C in the last 72 hours. CBG: Recent Labs  Lab 02/17/19 0635 02/17/19 1113 02/17/19 1619 02/17/19 2147 02/18/19 0635  GLUCAP 173* 122* 125* 149* 118*   Lipid Profile: No results for input(s): CHOL, HDL, LDLCALC, TRIG, CHOLHDL, LDLDIRECT in the last 72 hours. Thyroid  Function Tests: No results for input(s): TSH, T4TOTAL, FREET4, T3FREE, THYROIDAB in the last 72 hours. Anemia Panel: No results for input(s): VITAMINB12, FOLATE, FERRITIN, TIBC, IRON, RETICCTPCT in the last 72 hours. Sepsis Labs: No results for input(s): PROCALCITON, LATICACIDVEN in the last 168 hours.  Recent Results (from the past 240 hour(s))  SARS Coronavirus 2 (Performed in [place] hospital lab)     Status: None   Collection Time: 02/09/19 11:37 AM   Specimen: Nasal Swab  Result Value Ref Range Status   SARS Coronavirus 2 NEGATIVE NEGATIVE Final    Comment: (NOTE) SARS-CoV-2 target nucleic acids are NOT DETECTED. The SARS-CoV-2 RNA is generally detectable in upper and lower respiratory specimens during the acute phase of infection. Negative results do not preclude SARS-CoV-2 infection, do not rule out co-infections with other pathogens, and should not be used as the sole basis for treatment or other patient management decisions. Negative results must be combined with clinical observations, patient history, and epidemiological information. The expected result is Negative. Fact Sheet for Patients: SugarRoll.be Fact Sheet for Healthcare Providers: https://www.woods-mathews.com/ This test is not yet approved or cleared by the [place]  States FDA and  has been authorized for detection and/or diagnosis of SARS-CoV-2 by FDA under an Emergency Use Authorization (EUA). This EUA will remain  in effect (meaning this test can be used) for the duration of the COVID-19 declaration under Section 56 4(b)(1) of the Act, 21 U.S.C. section 360bbb-3(b)(1), unless the authorization is terminated or revoked sooner. Performed at [facility], [place] [address] [place], [place] [address]   Surgical pcr screen     Status: None   Collection Time: 02/11/19  6:07 AM   Specimen: Nasal Mucosa; Nasal Swab  Result Value Ref Range Status   MRSA, PCR  NEGATIVE NEGATIVE Final   Staphylococcus aureus NEGATIVE NEGATIVE Final    Comment: (NOTE) The Xpert SA Assay (FDA approved for NASAL specimens in patients 14 years of age and older), is one component of a comprehensive surveillance program. It is not intended to diagnose infection nor to guide or monitor treatment. Performed at [facility], [place] [address] [place], [place] [address]   Aerobic/Anaerobic Culture (surgical/deep wound)     Status: None   Collection Time: 02/11/19  7:50 AM   Specimen: Soft Tissue, Other  Result Value Ref Range Status   Specimen Description TISSUE RIGHT ANKLE MEDIAL  Final   Special Requests NONE  Final   Gram Stain   Final    RARE WBC PRESENT, PREDOMINANTLY PMN NO ORGANISMS SEEN    Culture   Final    No growth aerobically or anaerobically. Performed at [facility], [name] [address] [place], [place] [address]    Report Status 02/16/2019 FINAL  Final  SARS Coronavirus 2 [facility] order, Performed in [facility] hospital lab) Nasopharyngeal Nasopharyngeal Swab     Status: None   Collection Time: 02/13/19  6:42 PM   Specimen: Nasopharyngeal Swab  Result Value Ref Range Status   SARS Coronavirus 2 NEGATIVE NEGATIVE Final    Comment: (NOTE) If result is NEGATIVE SARS-CoV-2 target nucleic acids are NOT DETECTED. The SARS-CoV-2 RNA is generally detectable in upper and lower  respiratory specimens during the acute phase of infection. The lowest  concentration of SARS-CoV-2 viral copies this assay can detect is 250  copies / mL. A negative result does not preclude SARS-CoV-2 infection  and should not be used as the sole basis for treatment or other  patient management decisions.  A negative result may occur with  improper specimen collection / handling, submission of specimen other  than nasopharyngeal swab, presence of viral mutation(s) within the  areas targeted by this assay, and inadequate number of viral copies  (<250 copies / mL). A  negative result must be combined with clinical  observations, patient history, and epidemiological information. If result is POSITIVE SARS-CoV-2 target nucleic acids are DETECTED. The SARS-CoV-2 RNA is generally detectable in upper and lower  respiratory specimens dur ing the acute phase of infection.  Positive  results are indicative of active infection with SARS-CoV-2.  Clinical  correlation with patient history and other diagnostic information is  necessary to determine patient infection status.  Positive results do  not rule out bacterial infection or co-infection with other viruses. If result is PRESUMPTIVE POSTIVE SARS-CoV-2 nucleic acids MAY BE PRESENT.   A presumptive positive result was obtained on the submitted specimen  and confirmed on repeat testing.  While 2019 novel coronavirus  (SARS-CoV-2) nucleic acids may be present in the submitted sample  additional confirmatory testing may be necessary for epidemiological  and / or  clinical management purposes  to differentiate between  SARS-CoV-2 and other Sarbecovirus currently known to infect humans.  If clinically indicated additional testing with an alternate test  methodology [phone]) is advised. The SARS-CoV-2 RNA is generally  detectable in upper and lower respiratory sp ecimens during the acute  phase of infection. The expected result is Negative. Fact Sheet for Patients:  StrictlyIdeas.no Fact Sheet for Healthcare Providers: BankingDealers.co.za This test is not yet approved or cleared by the [place] FDA and has been authorized for detection and/or diagnosis of SARS-CoV-2 by FDA under an Emergency Use Authorization (EUA).  This EUA will remain in effect (meaning this test can be used) for the duration of the COVID-19 declaration under Section 564(b)(1) of the Act, 21 U.S.C. section 360bbb-3(b)(1), unless the authorization is terminated or revoked sooner. Performed  at [facility], [place] [address] [place], [place] [address]          Radiology Studies: No results found.      Scheduled Meds: . atorvastatin  40 mg Oral q1800  . azelastine  1 spray Each Nare BID  . docusate sodium  100 mg Oral BID  . furosemide  60 mg Oral BID  . gabapentin  300 mg Oral TID  . insulin aspart  0-20 Units Subcutaneous TID WC  . insulin aspart  0-5 Units Subcutaneous QHS  . insulin aspart  3 Units Subcutaneous TID WC  . insulin detemir  30 Units Subcutaneous QHS  . ipratropium-albuterol  3 mL Nebulization Q6H  . iron polysaccharides  150 mg Oral BID  . macitentan  10 mg Oral Daily  . mouth rinse  15 mL Mouth Rinse BID  . midodrine  5 mg Oral TID WC  . mometasone-formoterol  2 puff Inhalation BID  . potassium chloride  20 mEq Oral BID  . Selexipag  600 mcg Oral BID  . sertraline  75 mg Oral Daily  . traMADol  50 mg Oral Q6H   Continuous Infusions:   LOS: 8 days    Time spent: 25 minutes    Barb Merino, MD [facility] Pager [phone]  If 7PM-7AM, please contact night-coverage [email or web address] Password [email or web address] 02/19/2019, 10:30 AM

## 2019-02-19 NOTE — Plan of Care (Signed)

## 2019-02-19 NOTE — TOC Progression Note (Signed)
Transition of Care [facility]) - Progression Note    Patient Details  Name: Amy Shepard MRN: [id number] Date of Birth: [birth year]  Transition of Care [facility]) CM/SW Contact  Claudie Leach, RN Phone Number: 02/19/2019, 2:19 PM  Clinical Narrative:    Discussed LTAC placement with patient and presented [facility] and [facility].  Patient would like more information about both before choosing.    Referral called to [facility] with [facility] and [facility] with [facility].  They will contact patient today to tell her more about facilities.  Insurance authorization will be started on Monday and will determine if patient is accepted.  Repeat COVID testing may be required when closer to discharge.  None needed at present per both reps.    Expected Discharge Plan: Long Term Acute Care (LTAC) Barriers to Discharge: Insurance Authorization  Expected Discharge Plan and Services Expected Discharge Plan: [place] [facility] (LTAC)

## 2019-02-19 NOTE — Progress Notes (Signed)
Physical Therapy Treatment Patient Details Name: Amy Shepard MRN: [id number] DOB: 04-01-1946 Today's Date: 02/19/2019   Late Entry for 02/18/2019    History of Present Illness 73 yo 10wks s/p trimalleolar Rt ankle fx with syndesmotic disruption. 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 7/31 hardware removal with ankle fusion, wound VAC. PMHx: lung CA, CAD, spinal fusion, depression, CHF, pulmonary HTN, CKD    PT Comments    Patient progressing with mobility slowly due to limitations in cardiorespiratory reserve.  She is willing to participate, but quickly becomes dyspneic and hypoxic even on 20L HFNC.  She will benefit from SNF level rehab at d/c.   Follow Up Recommendations  SNF     Equipment Recommendations  None recommended by PT    Recommendations for Other Services       Precautions / Restrictions Precautions Precautions: Fall Required Braces or Orthoses: Other Brace Other Brace: CAM boot RLE Restrictions Weight Bearing Restrictions: Yes RLE Weight Bearing: Weight bearing as tolerated Other Position/Activity Restrictions: WBAT for transfer only with CAM boot    Mobility  Bed Mobility Overal bed mobility: Needs Assistance       Supine to sit: HOB elevated;Supervision Sit to supine: Supervision   General bed mobility comments: assist for safety, lines and to don camboot in sitting  Transfers Overall transfer level: Needs assistance Equipment used: 1 person hand held assist Transfers: Sit to/from [facility] Sit to Stand: Min assist Stand pivot transfers: Min assist       General transfer comment: used bed rail and 1 HHA for sit to stand and to pivot to [facility] and back to bed  Ambulation/Gait             General Gait Details: unable per orders   Stairs             Wheelchair Mobility    Modified Rankin (Stroke Patients Only)       Balance Overall balance assessment: Needs assistance         Standing balance support: Bilateral upper extremity supported Standing balance-Leahy Scale: Poor Standing balance comment: UE support in standing                            Cognition Arousal/Alertness: Awake/alert Behavior During Therapy: WFL for tasks assessed/performed Overall Cognitive Status: Within Functional Limits for tasks assessed                                        Exercises      General Comments General comments (skin integrity, edema, etc.): toileted on BSC and pt able to complete 90% of hygiene.  On 20L HFNC and SpO2 at lowest 85%, back up to 90% within 2 minutes with PLB      Pertinent Vitals/Pain Pain Assessment: Faces Faces Pain Scale: Hurts little more Pain Location: R foot with weight bearing Pain Descriptors / Indicators: Grimacing;Moaning Pain Intervention(s): Limited activity within patient's tolerance;Monitored during session;Repositioned    Home Living                      Prior Function            PT Goals (current goals can now be found in the care plan section) Progress towards PT goals: Progressing toward goals    Frequency    Min 2X/week  PT Plan Current plan remains appropriate    Co-evaluation              AM-PAC PT "6 Clicks" Mobility   Outcome Measure  Help needed turning from your back to your side while in a flat bed without using bedrails?: A Little Help needed moving from lying on your back to sitting on the side of a flat bed without using bedrails?: A Little Help needed moving to and from a bed to a chair (including a wheelchair)?: A Little Help needed standing up from a chair using your arms (e.g., wheelchair or bedside chair)?: A Little Help needed to walk in hospital room?: Total Help needed climbing 3-5 steps with a railing? : Total 6 Click Score: 14    End of Session Equipment Utilized During Treatment: Oxygen;Other (comment)(RLE camboot) Activity Tolerance:  Patient tolerated treatment well Patient left: in bed;with call bell/phone within reach   PT Visit Diagnosis: Muscle weakness (generalized) (M62.81);Other abnormalities of gait and mobility (R26.89)     Time: 1700-1725 PT Time Calculation (min) (ACUTE ONLY): 25 min  Charges:  $Therapeutic Activity: 23-37 mins                     Amy Shepard, Amy Shepard [facility] [phone] 02/19/2019    Amy Shepard 02/19/2019, 8:44 AM

## 2019-02-19 NOTE — Progress Notes (Signed)
Changed dry dressing with kerlix & ace wrap on Rt. ankle. Put on the boots on Rt. Leg for ambulating to chair. Patient was unreasonable for less than 15 sec when she was moving from bed to chair around noon time. She wasn't open her eyes with voice, she didn't move at all for 15 sec. Then she opened her eyes, but she didn't know what happened. Patient still have high flow 70% with 20 L. It was 15 L, but patient wasn't tolerated. Patient is mouth breather, explained patient to breath in her nose. Continue to monitor SpO2. HS [employer]

## 2019-02-19 NOTE — Progress Notes (Signed)
Subjective: 8 Days Post-Op Procedure(s) (LRB): REMOVAL DEEP HARDWARE RIGHT ANKLE (Right) RIGHT ANKLE FUSION (Right) Patient reports pain as mild.   Reports she got up more yesterday and is feeling stronger today and breathing easier.   Objective: Vital signs in last 24 hours: Temp:  [97.9 F (36.6 C)-98.6 F (37 C)] 98.1 F (36.7 C) (08/08 0745) Pulse Rate:  [70-80] 70 (08/08 0300) Resp:  [14-19] 14 (08/08 0300) BP: (91-120)/(50-75) 91/56 (08/08 0745) SpO2:  [90 %-99 %] 92 % (08/08 0300) FiO2 (%):  [70 %-80 %] 80 % (08/08 0300)  Intake/Output from previous day: 08/07 0701 - 08/08 0700 In: 920 [P.O.:920] Out: 1900 [Urine:1900] Intake/Output this shift: No intake/output data recorded.  Recent Labs    02/17/19 0224 02/18/19 0240 02/19/19 0229  HGB 7.3* 7.4* 7.3*   Recent Labs    02/18/19 0240 02/19/19 0229  WBC 9.6 8.8  RBC 2.98* 2.95*  HCT 25.1* 24.2*  PLT 199 186   Recent Labs    02/18/19 0240 02/19/19 0229  NA 139 138  K 4.4 4.1  CL 93* 93*  CO2 36* 35*  BUN 69* 63*  CREATININE 1.81* 1.66*  GLUCOSE 174* 143*  CALCIUM 8.8* 8.8*   No results for input(s): LABPT, INR in the last 72 hours.  Right ankle incisions intact. Mild ecchymosis of skin flaps medially. Scant serosanguineous drainage.  More alert and increased ability to converse with less SOB.    Assessment/Plan: 8 Days Post-Op Procedure(s) (LRB): REMOVAL DEEP HARDWARE RIGHT ANKLE (Right) RIGHT ANKLE FUSION (Right) S/P right ankle fusion- Continue fracture boot when OOB, WBAT.  Acute on chronic respiratory failure- Continues to require high FiO2, but hopefully can begin tapering some. LTAC consult pending.    Erlinda Hong, PA-C 02/19/2019, 7:53 AM  [facility] [phone]

## 2019-02-20 LAB — BASIC METABOLIC PANEL
Anion gap: 12 (ref 5–15)
BUN: 53 mg/dL — ABNORMAL HIGH (ref 8–23)
CO2: 36 mmol/L — ABNORMAL HIGH (ref 22–32)
Calcium: 9 mg/dL (ref 8.9–10.3)
Chloride: 94 mmol/L — ABNORMAL LOW (ref 98–111)
Creatinine, Ser: 1.32 mg/dL — ABNORMAL HIGH (ref 0.44–1.00)
GFR calc Af Amer: 47 mL/min — ABNORMAL LOW (ref >60)
GFR calc non Af Amer: 40 mL/min — ABNORMAL LOW (ref >60)
Glucose, Bld: 117 mg/dL — ABNORMAL HIGH (ref 70–99)
Potassium: 3.9 mmol/L (ref 3.5–5.1)
Sodium: 142 mmol/L (ref 135–145)

## 2019-02-20 LAB — CBC WITH DIFFERENTIAL/PLATELET
Abs Immature Granulocytes: 0.04 10*3/uL (ref 0.00–0.07)
Basophils Absolute: 0 10*3/uL (ref 0.0–0.1)
Basophils Relative: 0 %
Eosinophils Absolute: 0.3 10*3/uL (ref 0.0–0.5)
Eosinophils Relative: 4 %
HCT: 25.8 % — ABNORMAL LOW (ref 36.0–46.0)
Hemoglobin: 7.8 g/dL — ABNORMAL LOW (ref 12.0–15.0)
Immature Granulocytes: 1 %
Lymphocytes Relative: 8 %
Lymphs Abs: 0.6 10*3/uL — ABNORMAL LOW (ref 0.7–4.0)
MCH: 24.8 pg — ABNORMAL LOW (ref 26.0–34.0)
MCHC: 30.2 g/dL (ref 30.0–36.0)
MCV: 82.2 fL (ref 80.0–100.0)
Monocytes Absolute: 0.7 10*3/uL (ref 0.1–1.0)
Monocytes Relative: 9 %
Neutro Abs: 6.4 10*3/uL (ref 1.7–7.7)
Neutrophils Relative %: 78 %
Platelets: 184 10*3/uL (ref 150–400)
RBC: 3.14 MIL/uL — ABNORMAL LOW (ref 3.87–5.11)
RDW: 18.5 % — ABNORMAL HIGH (ref 11.5–15.5)
WBC: 8.1 10*3/uL (ref 4.0–10.5)
nRBC: 0 % (ref 0.0–0.2)

## 2019-02-20 NOTE — Plan of Care (Signed)
  Problem: Education: Goal: Knowledge of General Education information will improve Description: Including pain rating scale, medication(s)/side effects and non-pharmacologic comfort measures Outcome: Progressing   Problem: Clinical Measurements: Goal: Will remain free from infection Outcome: Progressing   Problem: Nutrition: Goal: Adequate nutrition will be maintained Outcome: Progressing   Problem: Coping: Goal: Level of anxiety will decrease Outcome: Progressing   Problem: Pain Managment: Goal: General experience of comfort will improve Outcome: Progressing

## 2019-02-20 NOTE — Progress Notes (Signed)
PROGRESS NOTE    Amy Shepard  BMW:[id number] DOB: 1946/05/13 DOA: 02/11/2019 PCP: Maurice Small, MD    Brief Narrative:  73 year old female with extensive medical problems including chronic hypoxic respiratory failure on 4 L of oxygen at home, traumatic right ankle fracture with nonunion, chronic diastolic heart failure, pulmonary hypertension, lung cancer, CKD stage III, recent complicated postop course in the hospital who was brought as elective patient for nonhealing right trimalleolar ankle fracture and underwent hardware removal and fusion of the ankle.  Postoperative patient was lethargic and weak requiring increased amount of oxygen so medical consultation.  remains on high flow oxygen despite being optimized on medical therapy.  She will have prolonged weaning from high flow oxygen.  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 pulmonary hypertension, baseline respiratory insufficiency and chronic diastolic heart failure.  Her oxygen requirement has been exacerbated by postop stress.  No evidence of pneumonia.  No evidence of other complications. Tapered off . Treated with IV Lasix, she is euvolemic with not much response, changed back to her oral Lasix. Keep on  oxygen to keep saturations more than 88%. Continue to work with PT OT. Anticipate prolonged recovery and weaning off oxygen.  Type 2 diabetes: On insulin that she will continue.  Stable.   Nonunion right ankle fracture: Postop surgical management as per surgery.  CKD stage III: With baseline creatinine about 1.5.  Fluctuates on diuretics.  Stabilizing.     Hypokalemia: Replaced and normalized.  Anemia of chronic disease: 7.3. no indication for transfusion.  Iron and ferritin levels are normal.  We will continue to follow patient throughout her hospitalization.  She will probably need to go to select or LTAC as she is on high oxygen.  DVT prophylaxis: Lovenox Code Status: Full code Family Communication: None Disposition Plan: As per surgery.  Anticipate LTAC versus SNF on discharge. Anticipating prolonged weaning from oxygen.   Procedures:   Right ankle surgery  Antimicrobials:   None   Subjective: No overnight events.  Remains on high flow oxygen but looks very comfortable.  Objective: Vitals:   02/20/19 0300 02/20/19 0750 02/20/19 0912 02/20/19 1059  BP: (!) 105/49 (!) 95/53  (!) 102/57  Pulse: 68 71    Resp: 11 17    Temp: 98.2 F (36.8 C) 98 F (36.7 C)  98.4 F (36.9 C)  TempSrc: Oral Oral  Oral  SpO2: 92% 93% 100%   Weight:      Height:        Intake/Output Summary (Last 24 hours) at 02/20/2019 1130 Last data filed at 02/20/2019 0818 Gross per 24 hour  Intake 882 ml  Output 500 ml  Net 382 ml   Filed Weights   02/11/19 0553  Weight: 77.1 kg    Examination:  General exam: Appears calm and comfortable,  on high flow oxygen.  Chronically sick looking.  Not in any distress. Respiratory system: Clear to auscultation. Respiratory effort normal.  No added sounds. Cardiovascular system: S1 & S2 heard, RRR. No JVD, murmurs, rubs, gallops or clicks. No pedal edema. Gastrointestinal system: Abdomen is nondistended, soft and nontender. No organomegaly or  masses felt. Normal bowel sounds heard. Central nervous system: Alert and oriented. No focal neurological deficits. Extremities: Symmetric 5 x 5 power.  Right ankle with surgical dressing.  Clean and intact.   Skin: No rashes, lesions or ulcers Psychiatry: Judgement and insight appear normal. Mood & affect appropriate.     Data Reviewed: I have personally reviewed following labs and imaging studies  CBC: Recent Labs  Lab 02/16/19 0232 02/17/19 0224 02/18/19 0240 02/19/19 0229 02/20/19 0235  WBC 8.2 8.9 9.6 8.8 8.1  NEUTROABS 7.5 6.9 7.4 6.7 6.4  HGB 7.4* 7.3* 7.4* 7.3* 7.8*  HCT 24.8* 24.5* 25.1* 24.2* 25.8*  MCV 82.4 83.1 84.2 82.0 82.2  PLT 196 219 199 186 347   Basic Metabolic Panel: Recent Labs  Lab 02/16/19 0232 02/17/19 0224 02/18/19 0240 02/19/19 0229 02/20/19 0235  NA 136 136 139 138 142  K 4.5 4.2 4.4 4.1 3.9  CL 91* 90* 93* 93* 94*  CO2 33* 36* 36* 35* 36*  GLUCOSE 323* 202* 174* 143* 117*  BUN 61* 61* 69* 63* 53*  CREATININE 1.55* 1.69* 1.81* 1.66* 1.32*  CALCIUM 9.1 8.9 8.8* 8.8* 9.0   GFR: Estimated Creatinine Clearance: 37 mL/min (A) (by C-G formula based on SCr of 1.32 mg/dL (H)). Liver Function Tests: No results for input(s): AST, ALT, ALKPHOS, BILITOT, PROT, ALBUMIN in the last 168 hours. No results for input(s): LIPASE, AMYLASE in the last 168 hours. No results for input(s): AMMONIA in the last 168 hours. Coagulation Profile: No results for input(s): INR, PROTIME in the last 168 hours. Cardiac Enzymes: No results for input(s): CKTOTAL, CKMB, CKMBINDEX, TROPONINI in the last 168 hours. BNP (last 3 results) No results for input(s): PROBNP in the last 8760 hours. HbA1C: No results for input(s): HGBA1C in the last 72 hours. CBG: Recent Labs  Lab 02/17/19 0635 02/17/19 1113 02/17/19 1619 02/17/19 2147 02/18/19 0635  GLUCAP 173* 122* 125* 149* 118*   Lipid Profile: No results for input(s): CHOL, HDL, LDLCALC, TRIG, CHOLHDL, LDLDIRECT in  the last 72 hours. Thyroid Function Tests: No results for input(s): TSH, T4TOTAL, FREET4, T3FREE, THYROIDAB in the last 72 hours. Anemia Panel: No results for input(s): VITAMINB12, FOLATE, FERRITIN, TIBC, IRON, RETICCTPCT in the last 72 hours. Sepsis Labs: No results for input(s): PROCALCITON, LATICACIDVEN in the last 168 hours.  Recent Results (from the past 240 hour(s))  Surgical pcr screen     Status: None   Collection Time: 02/11/19  6:07 AM   Specimen: Nasal Mucosa; Nasal Swab  Result Value Ref Range Status   MRSA, PCR NEGATIVE NEGATIVE Final   Staphylococcus aureus NEGATIVE NEGATIVE Final    Comment: (NOTE) The Xpert SA Assay (FDA approved for NASAL specimens in patients 36 years of age and older), is one component of a comprehensive surveillance program. It is not intended to diagnose infection nor to guide or monitor treatment. Performed at [facility], [place] [address] [place], [place] [address]   Aerobic/Anaerobic Culture (surgical/deep wound)     Status: None   Collection Time: 02/11/19  7:50 AM   Specimen: Soft Tissue, Other  Result Value Ref Range Status   Specimen Description TISSUE RIGHT ANKLE  MEDIAL  Final   Special Requests NONE  Final   Gram Stain   Final    RARE WBC PRESENT, PREDOMINANTLY PMN NO ORGANISMS SEEN    Culture   Final    No growth aerobically or anaerobically. Performed at [facility], [place] [address], [place], [place] [address]    Report Status 02/16/2019 FINAL  Final  SARS Coronavirus 2 [facility] order, Performed in [facility] hospital lab) Nasopharyngeal Nasopharyngeal Swab     Status: None   Collection Time: 02/13/19  6:42 PM   Specimen: Nasopharyngeal Swab  Result Value Ref Range Status   SARS Coronavirus 2 NEGATIVE NEGATIVE Final    Comment: (NOTE) If result is NEGATIVE SARS-CoV-2 target nucleic acids are NOT DETECTED. The SARS-CoV-2 RNA is generally detectable in upper and lower  respiratory specimens during the acute  phase of infection. The lowest  concentration of SARS-CoV-2 viral copies this assay can detect is 250  copies / mL. A negative result does not preclude SARS-CoV-2 infection  and should not be used as the sole basis for treatment or other  patient management decisions.  A negative result may occur with  improper specimen collection / handling, submission of specimen other  than nasopharyngeal swab, presence of viral mutation(s) within the  areas targeted by this assay, and inadequate number of viral copies  (<250 copies / mL). A negative result must be combined with clinical  observations, patient history, and epidemiological information. If result is POSITIVE SARS-CoV-2 target nucleic acids are DETECTED. The SARS-CoV-2 RNA is generally detectable in upper and lower  respiratory specimens dur ing the acute phase of infection.  Positive  results are indicative of active infection with SARS-CoV-2.  Clinical  correlation with patient history and other diagnostic information is  necessary to determine patient infection status.  Positive results do  not rule out bacterial infection or co-infection with other viruses. If result is PRESUMPTIVE POSTIVE SARS-CoV-2 nucleic acids MAY BE PRESENT.   A presumptive positive result was obtained on the submitted specimen  and confirmed on repeat testing.  While 2019 novel coronavirus  (SARS-CoV-2) nucleic acids may be present in the submitted sample  additional confirmatory testing may be necessary for epidemiological  and / or clinical management purposes  to differentiate between  SARS-CoV-2 and other Sarbecovirus currently known to infect humans.  If clinically indicated additional testing with an alternate test  methodology [phone]) is advised. The SARS-CoV-2 RNA is generally  detectable in upper and lower respiratory sp ecimens during the acute  phase of infection. The expected result is Negative. Fact Sheet for Patients:   StrictlyIdeas.no Fact Sheet for Healthcare Providers: BankingDealers.co.za This test is not yet approved or cleared by the [place] FDA and has been authorized for detection and/or diagnosis of SARS-CoV-2 by FDA under an Emergency Use Authorization (EUA).  This EUA will remain in effect (meaning this test can be used) for the duration of the COVID-19 declaration under Section 564(b)(1) of the Act, 21 U.S.C. section 360bbb-3(b)(1), unless the authorization is terminated or revoked sooner. Performed at [facility], [place] [address], [place],  [address]          Radiology Studies: No results found.      Scheduled Meds: . atorvastatin  40 mg Oral q1800  . azelastine  1 spray Each Nare BID  . docusate sodium  100 mg Oral BID  . furosemide  60 mg Oral BID  . gabapentin  300 mg Oral TID  .  insulin aspart  0-20 Units Subcutaneous TID WC  . insulin aspart  0-5 Units Subcutaneous QHS  . insulin aspart  3 Units Subcutaneous TID WC  . insulin detemir  30 Units Subcutaneous QHS  . ipratropium-albuterol  3 mL Nebulization Q6H  . iron polysaccharides  150 mg Oral BID  . macitentan  10 mg Oral Daily  . mouth rinse  15 mL Mouth Rinse BID  . midodrine  5 mg Oral TID WC  . mometasone-formoterol  2 puff Inhalation BID  . potassium chloride  20 mEq Oral BID  . Selexipag  600 mcg Oral BID  . sertraline  75 mg Oral Daily  . traMADol  50 mg Oral Q6H   Continuous Infusions:   LOS: 9 days    Time spent: 25 minutes    Barb Merino, MD [facility] Pager [phone]  If 7PM-7AM, please contact night-coverage [email or web address] Password [email or web address] 02/20/2019, 11:30 AM

## 2019-02-21 ENCOUNTER — Inpatient Hospital Stay (HOSPITAL_COMMUNITY): Payer: Medicare Other

## 2019-02-21 ENCOUNTER — Telehealth: Payer: Self-pay

## 2019-02-21 DIAGNOSIS — J9621 Acute and chronic respiratory failure with hypoxia: Secondary | ICD-10-CM

## 2019-02-21 DIAGNOSIS — J9622 Acute and chronic respiratory failure with hypercapnia: Secondary | ICD-10-CM

## 2019-02-21 LAB — GLUCOSE, CAPILLARY
Glucose-Capillary: 168 mg/dL — ABNORMAL HIGH (ref 70–99)
Glucose-Capillary: 115 mg/dL — ABNORMAL HIGH (ref 70–99)
Glucose-Capillary: 187 mg/dL — ABNORMAL HIGH (ref 70–99)
Glucose-Capillary: 108 mg/dL — ABNORMAL HIGH (ref 70–99)
Glucose-Capillary: 169 mg/dL — ABNORMAL HIGH (ref 70–99)
Glucose-Capillary: 151 mg/dL — ABNORMAL HIGH (ref 70–99)
Glucose-Capillary: 117 mg/dL — ABNORMAL HIGH (ref 70–99)
Glucose-Capillary: 122 mg/dL — ABNORMAL HIGH (ref 70–99)
Glucose-Capillary: 106 mg/dL — ABNORMAL HIGH (ref 70–99)
Glucose-Capillary: 119 mg/dL — ABNORMAL HIGH (ref 70–99)
Glucose-Capillary: 117 mg/dL — ABNORMAL HIGH (ref 70–99)
Glucose-Capillary: 85 mg/dL (ref 70–99)
Glucose-Capillary: 128 mg/dL — ABNORMAL HIGH (ref 70–99)
Glucose-Capillary: 166 mg/dL — ABNORMAL HIGH (ref 70–99)
Glucose-Capillary: 252 mg/dL — ABNORMAL HIGH (ref 70–99)

## 2019-02-21 LAB — BLOOD GAS, ARTERIAL
Acid-Base Excess: 12.9 mmol/L — ABNORMAL HIGH (ref 0.0–2.0)
Bicarbonate: 38.2 mmol/L — ABNORMAL HIGH (ref 20.0–28.0)
Drawn by: 560031
FIO2: 70
O2 Content: 30 L/min
O2 Saturation: 69.8 %
Patient temperature: 98.6
pCO2 arterial: 62.3 mmHg — ABNORMAL HIGH (ref 32.0–48.0)
pH, Arterial: 7.405 (ref 7.350–7.450)
pO2, Arterial: 40.5 mmHg — ABNORMAL LOW (ref 83.0–108.0)

## 2019-02-21 MED ORDER — ALBUTEROL SULFATE (2.5 MG/3ML) 0.083% IN NEBU: 2.5000 mg | INHALATION_SOLUTION | Freq: Four times a day (QID) | RESPIRATORY_TRACT | Status: DC

## 2019-02-21 MED ORDER — BUDESONIDE 0.5 MG/2ML IN SUSP
0.5000 mg | Freq: Two times a day (BID) | RESPIRATORY_TRACT | Status: DC
  Administered 2019-02-21 – 2019-03-11 (×37): 0.5 mg via RESPIRATORY_TRACT
  Filled 2019-02-21 (×37): qty 2

## 2019-02-21 MED ORDER — VITAMIN C 500 MG PO TABS
250.0000 mg | ORAL_TABLET | Freq: Two times a day (BID) | ORAL | Status: DC
  Administered 2019-02-21 – 2019-03-11 (×36): 250 mg via ORAL
  Filled 2019-02-21 (×37): qty 1

## 2019-02-21 MED ORDER — ARFORMOTEROL TARTRATE 15 MCG/2ML IN NEBU
15.0000 ug | INHALATION_SOLUTION | Freq: Two times a day (BID) | RESPIRATORY_TRACT | Status: DC
  Administered 2019-02-21 – 2019-03-11 (×37): 15 ug via RESPIRATORY_TRACT
  Filled 2019-02-21 (×36): qty 2

## 2019-02-21 MED ORDER — GUAIFENESIN ER 600 MG PO TB12
600.0000 mg | ORAL_TABLET | Freq: Two times a day (BID) | ORAL | Status: DC
  Administered 2019-02-21 – 2019-03-11 (×37): 600 mg via ORAL
  Filled 2019-02-21 (×37): qty 1

## 2019-02-21 MED ORDER — ALBUTEROL SULFATE (2.5 MG/3ML) 0.083% IN NEBU
2.5000 mg | INHALATION_SOLUTION | RESPIRATORY_TRACT | Status: DC | PRN
  Administered 2019-02-22: 2.5 mg via RESPIRATORY_TRACT
  Filled 2019-02-21: qty 3

## 2019-02-21 MED ORDER — UMECLIDINIUM BROMIDE 62.5 MCG/INH IN AEPB
1.0000 | INHALATION_SPRAY | Freq: Every day | RESPIRATORY_TRACT | Status: DC
  Administered 2019-02-22 – 2019-03-10 (×17): 1 via RESPIRATORY_TRACT
  Filled 2019-02-21 (×2): qty 7

## 2019-02-21 MED ORDER — ALBUTEROL SULFATE (2.5 MG/3ML) 0.083% IN NEBU
2.5000 mg | INHALATION_SOLUTION | Freq: Four times a day (QID) | RESPIRATORY_TRACT | Status: DC
  Administered 2019-02-21 – 2019-02-22 (×3): 2.5 mg via RESPIRATORY_TRACT
  Filled 2019-02-21 (×3): qty 3

## 2019-02-21 MED ORDER — SODIUM CHLORIDE 0.9 % IV SOLN
500.0000 mg | INTRAVENOUS | Status: DC
  Administered 2019-02-21 – 2019-02-25 (×5): 500 mg via INTRAVENOUS
  Filled 2019-02-21 (×5): qty 500

## 2019-02-21 MED ORDER — UMECLIDINIUM BROMIDE 62.5 MCG/INH IN AEPB
1.0000 | INHALATION_SPRAY | Freq: Every day | RESPIRATORY_TRACT | Status: DC
  Filled 2019-02-21: qty 7

## 2019-02-21 MED ORDER — METHYLPREDNISOLONE SODIUM SUCC 40 MG IJ SOLR
40.0000 mg | Freq: Four times a day (QID) | INTRAMUSCULAR | Status: DC
  Administered 2019-02-21 – 2019-02-25 (×18): 40 mg via INTRAVENOUS
  Filled 2019-02-21 (×18): qty 1

## 2019-02-21 MED ORDER — FUROSEMIDE 10 MG/ML IJ SOLN
60.0000 mg | Freq: Two times a day (BID) | INTRAMUSCULAR | Status: DC
  Administered 2019-02-21 – 2019-02-24 (×7): 60 mg via INTRAVENOUS
  Filled 2019-02-21 (×7): qty 6

## 2019-02-21 NOTE — Significant Event (Signed)
Rapid Response Event Note  Overview: Time Called: 0904 Arrival Time: 0910 Event Type: Respiratory  Initial Focused Assessment: On arrival, pt resting in bed on HHFNC 30L, 100% with spO2 84% (dropping to 70's at times but not sustaining). Pt asymptomatic, lung sounds diminished with some wheezing. Md at bedside, decreased to 70% FiO2. Pt remained asymptomatic and spO2 increased to 89-95%.   Interventions: CXR abg- 7.4/62/40/38 CCM consulted  Plan of Care (if not transferred): Continue to monitor pt respiratory status. spO2 goal >88%. RN instructed to call with any questions or concerns.   Event Summary: Name of Physician Notified: Ghimire -TR[facility], and ortho Md (prior to my arrival)   Outcome: Stayed in room and stabalized     Sherilyn Dacosta

## 2019-02-21 NOTE — Progress Notes (Signed)
PROGRESS NOTE    Amy Shepard  SAY:[id number] DOB: [birth year] DOA: 02/11/2019 PCP: Maurice Small, MD    Brief Narrative:  73 year old female with extensive medical problems including chronic hypoxic respiratory failure on 4 L of oxygen at home, traumatic right ankle fracture with nonunion, chronic diastolic heart failure, pulmonary hypertension, lung cancer, CKD stage III, recent complicated postop course in the hospital who was brought as elective patient for nonhealing right trimalleolar ankle fracture and underwent hardware removal and fusion of the ankle.  Postoperative patient was lethargic and weak requiring increased amount of oxygen so medical consultation.  remains on high flow oxygen despite being optimized on medical therapy.  She will have prolonged weaning from high flow oxygen. Unable to wean off the oxygen. 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 pulmonary hypertension, baseline respiratory insufficiency and chronic diastolic heart failure.  Her oxygen requirement has been exacerbated by postop stress.  No evidence of pneumonia.  No evidence of other complications.  Was treated with IV steroid, tapered  off. Treated with IV Lasix, she is euvolemic with not much response, changed back to her oral Lasix. Keep on oxygen to keep saturations more than 88%. Continue to work with PT OT. Anticipate prolonged recovery and weaning off oxygen. 02/21/2019, patient continues to require very high flow oxygen.  Due to high oxygen requirement, I called and discussed case with pulmonology and they will see patient in consultation.  Type 2 diabetes: On insulin that she will continue.  Stable.   Nonunion right ankle fracture: Postop surgical management as per surgery.  CKD stage III: With baseline creatinine about 1.5.  Fluctuates on diuretics.  Stabilizing.  Normalized.  Hypokalemia: Replaced and normalized.  Anemia of chronic disease: 7.3. no indication for transfusion.  Iron and ferritin levels are normal.  We will continue to follow patient throughout her hospitalization.  She will probably need to go to [facility] or LTAC as she is on high oxygen. Not stable to transfer today. Called and discussed with pulmonology. Stat ABG and chest x-ray ordered.  DVT prophylaxis: Lovenox Code Status: Full code Family Communication: None Disposition Plan: As per surgery.  Anticipate LTAC versus SNF on discharge. Anticipating prolonged weaning from oxygen.   Procedures:   Right ankle surgery  Antimicrobials:   None   Subjective: Patient herself did not have much events.  She has minimal symptoms and some mucus, noted to be hypoxic less than 80% on 70% FiO2 and 20 L high flow nasal cannula oxygen. Was called urgently to see patient with hypoxia.  Patient is comfortable on high flow oxygen.  Objective: Vitals:   02/21/19 0726 02/21/19 0741 02/21/19 0837 02/21/19 0900  BP:  (!) 86/51    Pulse:  84  79  Resp:  17  15  Temp:  98 F (36.7 C)    TempSrc:  Oral    SpO2: 93% 91% 90% (!) 83%  Weight:      Height:        Intake/Output Summary (Last 24 hours) at 02/21/2019 0958 Last data filed at  02/21/2019 0916 Gross per 24 hour  Intake 682 ml  Output 2200 ml  Net -1518 ml   Filed Weights   02/11/19 0553  Weight: 77.1 kg    Examination:  General exam: Appears calm and comfortable, on high flow oxygen.  Chronically sick looking.  Not in any distress. Respiratory system: Clear to auscultation. Respiratory effort normal.  Occasional upper airway sounds. Cardiovascular system: S1 & S2 heard, RRR. No JVD, murmurs, rubs, gallops or clicks. No pedal edema. Gastrointestinal system: Abdomen is nondistended, soft and nontender. No organomegaly or masses felt. Normal bowel sounds heard. Central nervous system: Alert and oriented. No focal neurological deficits. Extremities: Symmetric 5 x 5 power.  Right ankle with surgical dressing.  Clean and intact.   Skin: No rashes, lesions or ulcers Psychiatry: Judgement and insight appear normal. Mood & affect appropriate.     Data Reviewed: I have personally reviewed following labs and imaging studies  CBC: Recent Labs  Lab 02/16/19 0232 02/17/19 0224 02/18/19 0240 02/19/19 0229 02/20/19 0235  WBC 8.2 8.9 9.6 8.8 8.1  NEUTROABS 7.5 6.9 7.4 6.7 6.4  HGB 7.4* 7.3* 7.4* 7.3* 7.8*  HCT 24.8* 24.5* 25.1* 24.2* 25.8*  MCV 82.4 83.1 84.2 82.0 82.2  PLT 196 219 199 186 952   Basic Metabolic Panel: Recent Labs  Lab 02/16/19 0232 02/17/19 0224 02/18/19 0240 02/19/19 0229 02/20/19 0235  NA 136 136 139 138 142  K 4.5 4.2 4.4 4.1 3.9  CL 91* 90* 93* 93* 94*  CO2 33* 36* 36* 35* 36*  GLUCOSE 323* 202* 174* 143* 117*  BUN 61* 61* 69* 63* 53*  CREATININE 1.55* 1.69* 1.81* 1.66* 1.32*  CALCIUM 9.1 8.9 8.8* 8.8* 9.0   GFR: Estimated Creatinine Clearance: 37 mL/min (A) (by C-G formula based on SCr of 1.32 mg/dL (H)). Liver Function Tests: No results for input(s): AST, ALT, ALKPHOS, BILITOT, PROT, ALBUMIN in the last 168 hours. No results for input(s): LIPASE, AMYLASE in the last 168 hours. No results for input(s): AMMONIA in the last  168 hours. Coagulation Profile: No results for input(s): INR, PROTIME in the last 168 hours. Cardiac Enzymes: No results for input(s): CKTOTAL, CKMB, CKMBINDEX, TROPONINI in the last 168 hours. BNP (last 3 results) No results for input(s): PROBNP in the last 8760 hours. HbA1C: No results for input(s): HGBA1C in the last 72 hours. CBG: Recent Labs  Lab 02/20/19 0647 02/20/19 1133 02/20/19 1543 02/20/19 2124 02/21/19 0601  GLUCAP 122* 106* 119* 117* 85   Lipid Profile: No results for input(s): CHOL, HDL, LDLCALC, TRIG, CHOLHDL, LDLDIRECT in the last 72 hours. Thyroid Function Tests: No results for input(s): TSH, T4TOTAL, FREET4, T3FREE, THYROIDAB in the last 72 hours. Anemia Panel: No results for input(s): VITAMINB12, FOLATE, FERRITIN, TIBC, IRON, RETICCTPCT in the last 72 hours. Sepsis Labs: No results for input(s): PROCALCITON, LATICACIDVEN in the last 168 hours.  Recent Results (from the past 240 hour(s))  SARS Coronavirus 2 [facility] order, Performed in [facility] hospital lab) Nasopharyngeal Nasopharyngeal Swab     Status: None   Collection Time: 02/13/19  6:42 PM   Specimen: Nasopharyngeal Swab  Result Value Ref Range Status   SARS Coronavirus 2 NEGATIVE NEGATIVE Final  Comment: (NOTE) If result is NEGATIVE SARS-CoV-2 target nucleic acids are NOT DETECTED. The SARS-CoV-2 RNA is generally detectable in upper and lower  respiratory specimens during the acute phase of infection. The lowest  concentration of SARS-CoV-2 viral copies this assay can detect is 250  copies / mL. A negative result does not preclude SARS-CoV-2 infection  and should not be used as the sole basis for treatment or other  patient management decisions.  A negative result may occur with  improper specimen collection / handling, submission of specimen other  than nasopharyngeal swab, presence of viral mutation(s) within the  areas targeted by this assay, and inadequate number of viral copies  (<250  copies / mL). A negative result must be combined with clinical  observations, patient history, and epidemiological information. If result is POSITIVE SARS-CoV-2 target nucleic acids are DETECTED. The SARS-CoV-2 RNA is generally detectable in upper and lower  respiratory specimens dur ing the acute phase of infection.  Positive  results are indicative of active infection with SARS-CoV-2.  Clinical  correlation with patient history and other diagnostic information is  necessary to determine patient infection status.  Positive results do  not rule out bacterial infection or co-infection with other viruses. If result is PRESUMPTIVE POSTIVE SARS-CoV-2 nucleic acids MAY BE PRESENT.   A presumptive positive result was obtained on the submitted specimen  and confirmed on repeat testing.  While 2019 novel coronavirus  (SARS-CoV-2) nucleic acids may be present in the submitted sample  additional confirmatory testing may be necessary for epidemiological  and / or clinical management purposes  to differentiate between  SARS-CoV-2 and other Sarbecovirus currently known to infect humans.  If clinically indicated additional testing with an alternate test  methodology [phone]) is advised. The SARS-CoV-2 RNA is generally  detectable in upper and lower respiratory sp ecimens during the acute  phase of infection. The expected result is Negative. Fact Sheet for Patients:  StrictlyIdeas.no Fact Sheet for Healthcare Providers: BankingDealers.co.za This test is not yet approved or cleared by the [place] FDA and has been authorized for detection and/or diagnosis of SARS-CoV-2 by FDA under an Emergency Use Authorization (EUA).  This EUA will remain in effect (meaning this test can be used) for the duration of the COVID-19 declaration under Section 564(b)(1) of the Act, 21 U.S.C. section 360bbb-3(b)(1), unless the authorization is terminated or revoked  sooner. Performed at [facility], [place] [address], [place], [place] [address]          Radiology Studies: Dg Knee Left Port  Result Date: 02/21/2019 CLINICAL DATA:  Left knee pain without injury. EXAM: PORTABLE LEFT KNEE - 1-2 VIEW COMPARISON:  None. FINDINGS: No fracture or dislocation is noted. Moderate suprapatellar joint effusion is noted. Vascular calcifications are noted. Irregularity is seen involving the medial femoral condyle consistent with osteochondral injury or osteochondritis dissecans. Moderate degenerative changes seen in this joint space. IMPRESSION: Moderate suprapatellar joint effusion. 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 No acute fracture or dislocation is noted. Electronically Signed   By: Marijo Conception M.D.   On: 02/21/2019 08:28        Scheduled Meds:  atorvastatin  40 mg Oral q1800   azelastine  1 spray Each Nare BID   docusate sodium  100 mg Oral BID   furosemide  60 mg Oral BID   gabapentin  300 mg Oral TID   insulin aspart  0-20 Units Subcutaneous  TID WC   insulin aspart  0-5 Units Subcutaneous QHS   insulin aspart  3 Units Subcutaneous TID WC   insulin detemir  30 Units Subcutaneous QHS   ipratropium-albuterol  3 mL Nebulization Q6H   iron polysaccharides  150 mg Oral BID   macitentan  10 mg Oral Daily   mouth rinse  15 mL Mouth Rinse BID   midodrine  5 mg Oral TID WC   mometasone-formoterol  2 puff Inhalation BID   potassium chloride  20 mEq Oral BID   Selexipag  600 mcg Oral BID   sertraline  75 mg Oral Daily   traMADol  50 mg Oral Q6H   Continuous Infusions:   LOS: 10 days    Time spent: 25 minutes    Barb Merino, MD [facility] Pager [phone]  If 7PM-7AM, please contact night-coverage [email or web address] Password [facility] 02/21/2019, 9:58 AM

## 2019-02-21 NOTE — Care Management Important Message (Signed)
Important Message  Patient Details  Name: Amy Shepard MRN: [id number] Date of Birth: 09-14-1945   Medicare Important Message Given:  Yes     Memory Argue 02/21/2019, 4:04 PM

## 2019-02-21 NOTE — Consult Note (Signed)
NAME:  Amy Shepard, MRN:  [id number], DOB:  08/14/1945, LOS: 32 ADMISSION DATE:  02/11/2019, CONSULTATION DATE:  02/21/2019 REFERRING MD:  Dr. Sloan Leiter, CHIEF COMPLAINT:  Hypoxia  Brief History   73 year old female with chronic hypoxic respiratory failure admitted 7/31 for elective repair of nonhealing right trimalleolar ankle fracture s/p hardware removal and fusion on 7/31.  Post-operatively has had increased and ongoing oxygenation requirements despite optimized medical therapy with IV steroid taper and diuresis.  Pulmonary consulted for further pulmonary recommendations.   History of present illness   73 year old female with extensive past medical history of chronic hypoxic respiratory failure on home oxygen 4L, COPD, CAD, prior non-small cell lung cancer s/p chemo (dx 07/2008), pulmonary hypertension, chronic diastolic heart failure, HTN, HLD, DM, CKD stage III, anemia of chronic disease, and right charcot ankle fracture.    Admitted 7/31 for elective repair of nonhealing right trimalleolar ankle fracture s/p hardware removal and fusion on 7/31.  Post-operatively has had increased and ongoing oxygenation requirements requiring high flow nasal cannula despite optimized medical therapy with IV steroid taper and diuresis.  Patient denies recent fever or worsening shortness of breath from her baseline.  States her cough changed about three days ago and since has change in her sputum which at baseline is white.  No change in WBC or documented fever. Only admit weight documented but has negative 4.9L fluid balance after IV diuresis changed to PO on 02/18/2019.  IV steroids stopped 8/3.   CXR 8/10 showing stable left basilar opacity concerning for atelectasis vs infiltrate with pleural effusion with mild right mid lung subsegmental atelectasis.  Pulmonary consulted for further pulmonary recommendations.   Past Medical History  Chronic hypoxic respiratory failure on home oxygen 4L, COPD, CAD, prior  non-small cell lung cancer s/p chemo (dx 07/2008), pulmonary hypertension, chronic diastolic heart failure, HTN, HLD, DM, CKD stage III, anemia of chronic disease, right charcot ankle fracture  Significant Hospital Events   7/31 Admitted/ OR  Consults:  Ortho TRH  Procedures:  7/31- OR for Charcot Collapse Right Ankle with Failure Fixation  Significant Diagnostic Tests:   Micro Data:  8/2 SARS CoV-2 >> negative 7/31 right ankle tissue cx >> neg  Antimicrobials:  7/30 vanc OR 8/10 azithro >>  Interim history/subjective:   Objective   Blood pressure (!) 86/51, pulse 79, temperature 98 F (36.7 C), temperature source Oral, resp. rate 15, height _0  (1.575 m), weight 77.1 kg, SpO2 (!) 83 %.    FiO2 (%):  [70 %-100 %] 70 %   Intake/Output Summary (Last 24 hours) at 02/21/2019 1033 Last data filed at 02/21/2019 5003 Gross per 24 hour  Intake 682 ml  Output 2200 ml  Net -1518 ml   Filed Weights   02/11/19 0553  Weight: 77.1 kg   Examination: General:  Elderly female lying in bed in NAD HEENT: MM pink/moist Neuro: Alert/ oriented, MAE CV: rr, no mumur PULM:  Clear right, diminished left, no wheeze, congested productive cough with thick tan/yellow sputum, speaking full sentences without difficulty- no increased WOB GI: obese, soft, bs active  Extremities: warm/dry, trace edema, right foot wrapped  Skin: no rashes   Resolved Hospital Problem list    Assessment & Plan:   Acute on chronic hypoxic respiratory failure Possible AECOPD Left basilar atelectasis vs infiltrate  Acute on chronic diastolic HF Pulmonary hypertension  P:  Continue to wean heated HFNC for goal sat 88-94% Continue IS, add flutter valve with ongoing  aggressive pulmonary hygiene with PT/ OT Continue duonebs q 6 and prn albuterol Hold dulera and change to brovanna/ pulmicort nebs Add guaifenesin  Start azithromax x 5 day tx given new productive cough Trend CXR/ WBC/ fever curve Restart IV  steroids  Change lasix to IV with strict I/Os/ daily weights/ trend BMP Consider input from HF team to optimize HF/ pulmonary hypertension medications/ management   PCCM will continue to follow   Labs   CBC: Recent Labs  Lab 02/16/19 0232 02/17/19 0224 02/18/19 0240 02/19/19 0229 02/20/19 0235  WBC 8.2 8.9 9.6 8.8 8.1  NEUTROABS 7.5 6.9 7.4 6.7 6.4  HGB 7.4* 7.3* 7.4* 7.3* 7.8*  HCT 24.8* 24.5* 25.1* 24.2* 25.8*  MCV 82.4 83.1 84.2 82.0 82.2  PLT 196 219 199 186 751    Basic Metabolic Panel: Recent Labs  Lab 02/16/19 0232 02/17/19 0224 02/18/19 0240 02/19/19 0229 02/20/19 0235  NA 136 136 139 138 142  K 4.5 4.2 4.4 4.1 3.9  CL 91* 90* 93* 93* 94*  CO2 33* 36* 36* 35* 36*  GLUCOSE 323* 202* 174* 143* 117*  BUN 61* 61* 69* 63* 53*  CREATININE 1.55* 1.69* 1.81* 1.66* 1.32*  CALCIUM 9.1 8.9 8.8* 8.8* 9.0   GFR: Estimated Creatinine Clearance: 37 mL/min (A) (by C-G formula based on SCr of 1.32 mg/dL (H)). Recent Labs  Lab 02/17/19 0224 02/18/19 0240 02/19/19 0229 02/20/19 0235  WBC 8.9 9.6 8.8 8.1    Liver Function Tests: No results for input(s): AST, ALT, ALKPHOS, BILITOT, PROT, ALBUMIN in the last 168 hours. No results for input(s): LIPASE, AMYLASE in the last 168 hours. No results for input(s): AMMONIA in the last 168 hours.  ABG    Component Value Date/Time   PHART 7.405 02/21/2019 0940   PCO2ART 62.3 (H) 02/21/2019 0940   PO2ART 40.5 (L) 02/21/2019 0940   HCO3 38.2 (H) 02/21/2019 0940   TCO2 33 (H) 11/24/2018 0552   ACIDBASEDEF 0.2 07/18/2008 0954   O2SAT 69.8 02/21/2019 0940     Coagulation Profile: No results for input(s): INR, PROTIME in the last 168 hours.  Cardiac Enzymes: No results for input(s): CKTOTAL, CKMB, CKMBINDEX, TROPONINI in the last 168 hours.  HbA1C: Hgb A1c MFr Bld  Date/Time Value Ref Range Status  02/11/2019 06:07 AM 6.9 (H) 4.8 - 5.6 % Final    Comment:    (NOTE) Pre diabetes:          5.7%-6.4% Diabetes:               >6.4% Glycemic control for   <7.0% adults with diabetes   04/11/2018 07:34 AM 7.2 (H) 4.8 - 5.6 % Final    Comment:    (NOTE) Pre diabetes:          5.7%-6.4% Diabetes:              >6.4% Glycemic control for   <7.0% adults with diabetes     CBG: Recent Labs  Lab 02/20/19 0647 02/20/19 1133 02/20/19 1543 02/20/19 2124 02/21/19 0601  GLUCAP 122* 106* 119* 117* 85    Review of Systems:   Review of Systems  Constitutional: Negative for chills and fever.  HENT: Negative for sinus pain.   Respiratory: Positive for cough, sputum production and wheezing. Negative for shortness of breath.   Cardiovascular: Negative for chest pain and palpitations.  Gastrointestinal: Negative for abdominal pain, nausea and vomiting.  Neurological: Negative for focal weakness and loss of consciousness.    Past Medical  History  She,  has a past medical history of Anemia, Anxiety, Arthritis, CAD (coronary artery disease), Chronic diastolic CHF (congestive heart failure) ([place]), Chronic respiratory failure ([place]), CKD (chronic kidney disease), stage III ([place]), Complication of anesthesia, COPD (chronic obstructive pulmonary disease) ([place]), Cor pulmonale ([place]), Depression, Diabetes mellitus, Fracture, seasonal allergies, Hypercholesteremia, Hypertension, Lung cancer ([place]), On home oxygen therapy, Pericardial effusion, Pneumonia, Pulmonary hypertension ([place]), and UTI (urinary tract infection).   Surgical History    Past Surgical History:  Procedure Laterality Date  . ANKLE FUSION Right 02/11/2019   Procedure: RIGHT ANKLE FUSION;  Surgeon: Newt Minion, MD;  Location: [facility];  Service: Orthopedics;  Laterality: Right;  . APPLICATION OF WOUND VAC Right 11/23/2018   Procedure: Application Of Wound Vac;  Surgeon: Newt Minion, MD;  Location: [facility];  Service: Orthopedics;  Laterality: Right;  . [facility] in [place]     bilateral cataracts   . HARDWARE REMOVAL Right 02/11/2019   Procedure: REMOVAL DEEP HARDWARE RIGHT ANKLE;  Surgeon: Newt Minion, MD;  Location: [facility];  Service: Orthopedics;  Laterality: Right;  . LEFT HEART CATHETERIZATION WITH CORONARY ANGIOGRAM N/A 07/12/2014   Procedure: LEFT HEART CATHETERIZATION WITH CORONARY ANGIOGRAM;  Surgeon: [name], MD;  Location: [facility] CATH LAB;  Service: Cardiovascular;  Laterality: N/A;  . MAXIMUM ACCESS (MAS)POSTERIOR LUMBAR INTERBODY FUSION (PLIF) 2 LEVEL N/A 02/22/2016   Procedure: Lumbar three-four - Lumbar four-five  MAXIMUM ACCESS SURGERY  POSTERIOR LUMBAR INTERBODY FUSION;  Surgeon: [name], MD;  Location: [facility];  Service: Neurosurgery;  Laterality: N/A;  . ORIF ANKLE FRACTURE Right 11/23/2018   Procedure: OPEN REDUCTION INTERNAL FIXATION (ORIF) RIGHT ANKLE FRACTURE;  Surgeon: Newt Minion, MD;  Location: [facility];  Service: Orthopedics;  Laterality: Right;  . ORIF TOE FRACTURE Left 06/12/2017   Procedure: OPEN REDUCTION INTERNAL FIXATION (ORIF) BASE 1ST METATARSAL (TOE) FRACTURE;  Surgeon: Newt Minion, MD;  Location: [facility];  Service: Orthopedics;  Laterality: Left;  . RIGHT HEART CATH N/A 08/06/2017   Procedure: RIGHT HEART CATH;  Surgeon: Larey Dresser, MD;  Location: [facility] CV LAB;  Service: Cardiovascular;  Laterality: N/A;  . THORACOTOMY Right 2010   lower  . TONSILLECTOMY       Social History   reports that she quit smoking about 12 years ago. She has a 30.00 pack-year smoking history. She has never used smokeless tobacco. She reports that she does not drink alcohol or use drugs.   Family History   Her family history includes Cancer in her sister; Diabetes in her brother and father; Heart failure in her brother and father.   Allergies Allergies  Allergen Reactions  . Amoxicillin Anaphylaxis, Hives, Rash and Other (See Comments)    Has patient had a PCN reaction causing immediate rash, facial/tongue/throat swelling, SOB or lightheadedness  with hypotension: YES Positive reaction causing SEVERE RASH INVOLVING MUCUS MEMBRANES/SKIN NECROSIS: YES Reaction that required HOSPITALIZATION: YES Reaction occurring within the last 10 years: NO     Home Medications  Prior to Admission medications   Medication Sig Start Date End Date Taking? Authorizing Provider  acetaminophen (TYLENOL) 325 MG tablet Take 650 mg by mouth every 6 (six) hours as needed for moderate pain or fever.    Yes [provider]  atorvastatin (LIPITOR) 40 MG tablet Take 1 tablet (40 mg total) by mouth daily. 04/20/18 01/11/20 Yes  Larey Dresser, MD  azelastine (ASTELIN) 0.1 % nasal spray Place 1 spray into both nostrils 2 (two) times daily. Use in each nostril as directed 12/28/18  Yes Martyn Ehrich, NP  doxycycline (VIBRAMYCIN) 100 MG capsule Take 1 capsule (100 mg total) by mouth 2 (two) times daily. 01/24/19  Yes Rayburn, Neta Mends, PA-C  furosemide (LASIX) 40 MG tablet Take 1.5 tablets (60 mg total) by mouth 2 (two) times daily. Patient taking differently: Take 40 mg by mouth 2 (two) times daily.  01/11/19  Yes Larey Dresser, MD  gabapentin (NEURONTIN) 300 MG capsule Take 1 capsule (300 mg total) by mouth at bedtime. Patient taking differently: Take 300 mg by mouth 3 (three) times daily.  11/30/18  Yes Domenic Polite, MD  Insulin Detemir (LEVEMIR FLEXTOUCH) 100 UNIT/ML Pen Inject 30 Units into the skin at bedtime. 12/14/18  Yes Love, Ivan Anchors, PA-C  insulin lispro (HUMALOG) 100 UNIT/ML injection Inject 2-11 Units into the skin 3 (three) times daily before meals.    Yes [provider]  macitentan (OPSUMIT) 10 MG tablet Take 1 tablet (10 mg total) by mouth daily. 08/25/18  Yes Larey Dresser, MD  midodrine (PROAMATINE) 5 MG tablet Take 1 tablet (5 mg total) by mouth 3 (three) times daily with meals. 12/14/18  Yes Love, Ivan Anchors, PA-C  mometasone-formoterol (DULERA) 200-5 MCG/ACT AERO Inhale 2 puffs into the lungs 2 (two) times daily. 12/14/18   Yes Love, Ivan Anchors, PA-C  OXYGEN Inhale 3 L into the lungs continuous. FOR COPD   Yes [provider]  Selexipag (UPTRAVI) 200 MCG TABS Take 2 tablets (400 mcg total) by mouth 2 (two) times a day. Patient taking differently: Take 600 mcg by mouth 2 (two) times a day.  02/07/19  Yes Larey Dresser, MD  sertraline (ZOLOFT) 50 MG tablet Take 75 mg by mouth daily.    Yes [provider]  sodium chloride (OCEAN) 0.65 % SOLN nasal spray Place 1 spray into both nostrils as needed for congestion. 12/14/18  Yes Love, Ivan Anchors, PA-C  TRULICITY 1.5 EU/2.3NT SOPN Inject 1.5 mg into the vein once a week.  11/08/18  Yes [provider]  citalopram (CELEXA) 10 MG tablet TAKE 1 TABLET BY MOUTH EVERY DAY Patient not taking: Reported on 02/10/2019 01/31/19   Charlett Blake, MD  diclofenac sodium (VOLTAREN) 1 % GEL Apply 2 g topically 4 (four) times daily. Patient not taking: Reported on 02/10/2019 12/14/18   Love, Ivan Anchors, PA-C  docusate sodium (COLACE) 100 MG capsule Take 1 capsule (100 mg total) by mouth 2 (two) times daily. Patient not taking: Reported on 02/10/2019 12/14/18   Love, Ivan Anchors, PA-C  ipratropium-albuterol (DUONEB) 0.5-2.5 (3) MG/3ML SOLN Take 3 mLs by nebulization every 4 (four) hours as needed (wheezing, Shortness of breath). Patient not taking: Reported on 02/10/2019 03/13/16   Murlean Iba, MD  iron polysaccharides (NIFEREX) 150 MG capsule Take 1 capsule (150 mg total) by mouth 2 (two) times daily. 12/14/18   Love, Ivan Anchors, PA-C  polyethylene glycol (MIRALAX / GLYCOLAX) 17 g packet Take 17 g by mouth daily as needed for mild constipation. Patient not taking: Reported on 02/10/2019 11/30/18   Domenic Polite, MD  traMADol (ULTRAM) 50 MG tablet Take 50 mg by mouth every 6 (six) hours as needed for moderate pain.    [provider]         Kennieth Rad, MSN, AGACNP-BC [facility] Pgr:  [phone] or if no answer [phone] 02/21/2019,  10:33 AM

## 2019-02-21 NOTE — Progress Notes (Signed)
Subjective: 10 Days Post-Op Procedure(s) (LRB): REMOVAL DEEP HARDWARE RIGHT ANKLE (Right) RIGHT ANKLE FUSION (Right) Patient reports pain as mild.   Reports new pain over the left medial knee.  Will check left knee xrays.  Objective: Vital signs in last 24 hours: Temp:  [97.5 F (36.4 C)-98.4 F (36.9 C)] 98 F (36.7 C) (08/10 0741) Pulse Rate:  [71-84] 84 (08/10 0741) Resp:  [13-18] 17 (08/10 0741) BP: (86-109)/(51-59) 86/51 (08/10 0741) SpO2:  [89 %-100 %] 91 % (08/10 0741) FiO2 (%):  [80 %-90 %] 90 % (08/10 0726)  Intake/Output from previous day: 08/09 0701 - 08/10 0700 In: 702 [P.O.:702] Out: 1300 [Urine:1300] Intake/Output this shift: No intake/output data recorded.  Recent Labs    02/19/19 0229 02/20/19 0235  HGB 7.3* 7.8*   Recent Labs    02/19/19 0229 02/20/19 0235  WBC 8.8 8.1  RBC 2.95* 3.14*  HCT 24.2* 25.8*  PLT 186 184   Recent Labs    02/19/19 0229 02/20/19 0235  NA 138 142  K 4.1 3.9  CL 93* 94*  CO2 35* 36*  BUN 63* 53*  CREATININE 1.66* 1.32*  GLUCOSE 143* 117*  CALCIUM 8.8* 9.0   No results for input(s): LABPT, INR in the last 72 hours.  Right ankle incisions intact. Scant drainage. Some tenderness to palpation over the medial foot.  Desat'ing into 80's this am, but does not appear acutely distressed.    Assessment/Plan: 10 Days Post-Op Procedure(s) (LRB): REMOVAL DEEP HARDWARE RIGHT ANKLE (Right) RIGHT ANKLE FUSION (Right) S/P right ankle fusion- POD#10- continue daily dry dressing and fracture boot when up.  Left knee pain- suspect OA with increased pain as putting more weight on the left leg now following right ankle fusion- check xrays Acute on chronic respiratory failure- Continue to require high flow O2.  LTAC consulted for possible admit.    Erlinda Hong, PA-C 02/21/2019, 7:44 AM  [facility] [phone]

## 2019-02-21 NOTE — Telephone Encounter (Signed)
Lilia Pro at [facility] called concerning patient.  Had put Lilia Pro on hold to talk with Dr. Jess Barters assistant due to Dr. Sharol Given not being in the office today. When returning to the call, Lilia Pro was not longer holding.

## 2019-02-22 LAB — CBC WITH DIFFERENTIAL/PLATELET
Abs Immature Granulocytes: 0.12 10*3/uL — ABNORMAL HIGH (ref 0.00–0.07)
Basophils Absolute: 0 10*3/uL (ref 0.0–0.1)
Basophils Relative: 0 %
Eosinophils Absolute: 0 10*3/uL (ref 0.0–0.5)
Eosinophils Relative: 0 %
HCT: 28.3 % — ABNORMAL LOW (ref 36.0–46.0)
Hemoglobin: 8.4 g/dL — ABNORMAL LOW (ref 12.0–15.0)
Immature Granulocytes: 1 %
Lymphocytes Relative: 3 %
Lymphs Abs: 0.2 10*3/uL — ABNORMAL LOW (ref 0.7–4.0)
MCH: 24.6 pg — ABNORMAL LOW (ref 26.0–34.0)
MCHC: 29.7 g/dL — ABNORMAL LOW (ref 30.0–36.0)
MCV: 83 fL (ref 80.0–100.0)
Monocytes Absolute: 0.2 10*3/uL (ref 0.1–1.0)
Monocytes Relative: 2 %
Neutro Abs: 8 10*3/uL — ABNORMAL HIGH (ref 1.7–7.7)
Neutrophils Relative %: 94 %
Platelets: 224 10*3/uL (ref 150–400)
RBC: 3.41 MIL/uL — ABNORMAL LOW (ref 3.87–5.11)
RDW: 18.2 % — ABNORMAL HIGH (ref 11.5–15.5)
WBC: 8.5 10*3/uL (ref 4.0–10.5)
nRBC: 0 % (ref 0.0–0.2)

## 2019-02-22 LAB — BASIC METABOLIC PANEL
Anion gap: 12 (ref 5–15)
BUN: 56 mg/dL — ABNORMAL HIGH (ref 8–23)
CO2: 35 mmol/L — ABNORMAL HIGH (ref 22–32)
Calcium: 9.3 mg/dL (ref 8.9–10.3)
Chloride: 92 mmol/L — ABNORMAL LOW (ref 98–111)
Creatinine, Ser: 1.52 mg/dL — ABNORMAL HIGH (ref 0.44–1.00)
GFR calc Af Amer: 39 mL/min — ABNORMAL LOW (ref >60)
GFR calc non Af Amer: 34 mL/min — ABNORMAL LOW (ref >60)
Glucose, Bld: 205 mg/dL — ABNORMAL HIGH (ref 70–99)
Potassium: 4.1 mmol/L (ref 3.5–5.1)
Sodium: 139 mmol/L (ref 135–145)

## 2019-02-22 LAB — GLUCOSE, CAPILLARY
Glucose-Capillary: 206 mg/dL — ABNORMAL HIGH (ref 70–99)
Glucose-Capillary: 235 mg/dL — ABNORMAL HIGH (ref 70–99)
Glucose-Capillary: 255 mg/dL — ABNORMAL HIGH (ref 70–99)
Glucose-Capillary: 189 mg/dL — ABNORMAL HIGH (ref 70–99)

## 2019-02-22 MED ORDER — ACETAZOLAMIDE 250 MG PO TABS
250.0000 mg | ORAL_TABLET | Freq: Two times a day (BID) | ORAL | Status: AC
  Administered 2019-02-22 – 2019-02-23 (×4): 250 mg via ORAL
  Filled 2019-02-22 (×4): qty 1

## 2019-02-22 MED ORDER — ALBUTEROL SULFATE (2.5 MG/3ML) 0.083% IN NEBU
2.5000 mg | INHALATION_SOLUTION | Freq: Three times a day (TID) | RESPIRATORY_TRACT | Status: DC
  Administered 2019-02-22 – 2019-02-27 (×14): 2.5 mg via RESPIRATORY_TRACT
  Filled 2019-02-22 (×14): qty 3

## 2019-02-22 MED ORDER — GLUCERNA SHAKE PO LIQD: 237.0000 mL | Freq: Two times a day (BID) | ORAL | Status: DC

## 2019-02-22 NOTE — Progress Notes (Signed)
Patient continues to refuse BIPAP.  SP02 was stable through out the night on the HFNC this am patient had trouble keeping SP02 above 82%.  Pt given PRN Albuterol neb SP02 increased to 93%.  RT will continue to monitor and assess patient for further treatment.

## 2019-02-22 NOTE — Progress Notes (Signed)
Subjective: 11 Days Post-Op Procedure(s) (LRB): REMOVAL DEEP HARDWARE RIGHT ANKLE (Right) RIGHT ANKLE FUSION (Right) Patient reports pain as mild.   Reports feels congested and coughing up tan sputum. Still 100% FiO2 with some desats into the 80's, but reports she feels comfortable.  Objective: Vital signs in last 24 hours: Temp:  [97.7 F (36.5 C)-98.8 F (37.1 C)] 97.7 F (36.5 C) (08/11 0801) Pulse Rate:  [75-91] 91 (08/11 0801) Resp:  [13-26] 19 (08/11 0801) BP: (93-122)/(49-64) 122/60 (08/11 0801) SpO2:  [80 %-99 %] 90 % (08/11 0801) FiO2 (%):  [100 %] 100 % (08/11 0735)  Intake/Output from previous day: 08/10 0701 - 08/11 0700 In: 910 [P.O.:660; IV Piggyback:250] Out: 1950 [Urine:1950] Intake/Output this shift: Total I/O In: 220 [P.O.:220] Out: -   Recent Labs    02/20/19 0235 02/22/19 0736  HGB 7.8* 8.4*   Recent Labs    02/20/19 0235 02/22/19 0736  WBC 8.1 8.5  RBC 3.14* 3.41*  HCT 25.8* 28.3*  PLT 184 224   Recent Labs    02/20/19 0235 02/22/19 0736  NA 142 139  K 3.9 4.1  CL 94* 92*  CO2 36* 35*  BUN 53* 56*  CREATININE 1.32* 1.52*  GLUCOSE 117* 205*  CALCIUM 9.0 9.3   No results for input(s): LABPT, INR in the last 72 hours.  Right ankle incisions with scant bloody drainage from anterior and lateral incisions, no signs of infection or cellulitis. Sutures intact.     Assessment/Plan: 11 Days Post-Op Procedure(s) (LRB): REMOVAL DEEP HARDWARE RIGHT ANKLE (Right) RIGHT ANKLE FUSION (Right)  S/P right ankle fusion for non union right ankle fracture- Continue dry dressings to incisions and sutures for another 1-2 weeks.  Left knee with OA and small effusion- tap if remains symptomatic  Acute on chronic respiratory failure- appreciate Pulmonary/Hospitalist care- diuresis and steroids begun. Started Zithromax and medications adjusted.  Dispo- Hopefully to LTAC as stabilizes.   Erlinda Hong, PA-C 02/22/2019, 9:46 AM  [facility] [phone]

## 2019-02-22 NOTE — Progress Notes (Signed)
PROGRESS NOTE    Amy Shepard  TDD:[id number] DOB: 1945/10/02 DOA: 02/11/2019 PCP: Maurice Small, MD    Brief Narrative:  73 year old female with extensive medical problems including chronic hypoxic respiratory failure on 4 L of oxygen at home, traumatic right ankle fracture with nonunion, chronic diastolic heart failure, pulmonary hypertension, lung cancer, CKD stage III, recent complicated postop course in the hospital who was brought as elective patient for nonhealing right trimalleolar ankle fracture and underwent hardware removal and fusion of the ankle.  Postoperative patient was lethargic and weak requiring increased amount of oxygen so medical consultation. remains on high flow oxygen despite being optimized on medical therapy.  She will have prolonged weaning from high flow oxygen. Unable to wean off the oxygen. 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 pulmonary hypertension, baseline respiratory insufficiency and chronic diastolic heart failure. Initially treated with steroids and IV Lasix and tapered off, some clinical improvement and again developed more respiratory distress with increased oxygen demand.  02/21/2019, pulmonary consulted. Restarted on IV steroids, oral azithromycin and IV Lasix. She is on multiple medications including inhalational steroids, bronchodilator therapy and treatment for pulmonary hypertension. Keep on oxygen to keep saturations more than 88%. Continue to work with PT OT. Anticipate prolonged recovery and weaning off oxygen.  Type 2 diabetes: On insulin that she will continue.  Stable.   Nonunion right ankle fracture: Postop surgical management as per surgery.  Improving.  CKD stage III: With baseline creatinine about 1.5.  Fluctuates on diuretics.   Hypokalemia: Replaced and normalized.  Anemia of chronic disease: 8.4. no indication for transfusion.  Iron and ferritin levels are normal.  We will continue to follow patient throughout her hospitalization.  She will probably need to go to [facility] or [facility] as she is on high oxygen. Not stable to transfer today.   DVT prophylaxis: Lovenox Code Status: Full code Family Communication: None Disposition Plan: As per surgery.  Anticipate LTAC versus SNF on discharge. Anticipating prolonged weaning from oxygen.   Procedures:   Right ankle surgery  Antimicrobials:   Azithromycin, 18 2020>>>   Subjective: Patient seen and examined in the morning rounds.  She herself has no complaints.  Had fluctuating levels overnight and needed to go on nonrebreather.  She did not want to use BiPAP.  Has some cough with yellowish sputum. She was not sure what she will do if there is a need for intubation and go for mechanical ventilation.  I explained to her about DNR/DNI, she did not make any decisions.  She would like to talk to her children about it.  Afebrile.  Objective: Vitals:   02/22/19 0801 02/22/19 1100 02/22/19 1125 02/22/19 1145  BP: 122/60  134/67   Pulse: 91  78 81  Resp: 19  19  17  Temp: 97.7 F (36.5 C)   97.7 F (36.5 C)  TempSrc: Oral   Oral  SpO2: 90%  94% (!) 89%  Weight:  78.2 kg    Height:         Intake/Output Summary (Last 24 hours) at 02/22/2019 1154 Last data filed at 02/22/2019 1023 Gross per 24 hour  Intake 690 ml  Output 1050 ml  Net -360 ml   Filed Weights   02/11/19 0553 02/22/19 1100  Weight: 77.1 kg 78.2 kg    Examination:  General exam: Appears calm and comfortable, on high flow oxygen.  Chronically sick looking.   Respiratory system: Clear to auscultation. Respiratory effort normal.  Occasional conducted upper airway sounds. Cardiovascular system: S1 & S2 heard, RRR. No JVD, murmurs, rubs, gallops or clicks. No pedal edema. Gastrointestinal system: Abdomen is nondistended, soft and nontender. No organomegaly or masses felt. Normal bowel sounds heard. Central nervous system: Alert and oriented. No focal neurological deficits. Extremities: Symmetric 5 x 5 power.  Right ankle with surgical dressing.  Clean and intact.   Skin: No rashes, lesions or ulcers Psychiatry: Judgement and insight appear normal. Mood & affect appropriate.     Data Reviewed: I have personally reviewed following labs and imaging studies  CBC: Recent Labs  Lab 02/17/19 0224 02/18/19 0240 02/19/19 0229 02/20/19 0235 02/22/19 0736  WBC 8.9 9.6 8.8 8.1 8.5  NEUTROABS 6.9 7.4 6.7 6.4 8.0*  HGB 7.3* 7.4* 7.3* 7.8* 8.4*  HCT 24.5* 25.1* 24.2* 25.8* 28.3*  MCV 83.1 84.2 82.0 82.2 83.0  PLT 219 199 186 184 154   Basic Metabolic Panel: Recent Labs  Lab 02/17/19 0224 02/18/19 0240 02/19/19 0229 02/20/19 0235 02/22/19 0736  NA 136 139 138 142 139  K 4.2 4.4 4.1 3.9 4.1  CL 90* 93* 93* 94* 92*  CO2 36* 36* 35* 36* 35*  GLUCOSE 202* 174* 143* 117* 205*  BUN 61* 69* 63* 53* 56*  CREATININE 1.69* 1.81* 1.66* 1.32* 1.52*  CALCIUM 8.9 8.8* 8.8* 9.0 9.3   GFR: Estimated Creatinine Clearance: 32.4 mL/min (A) (by C-G formula based on SCr of 1.52 mg/dL (H)). Liver Function Tests: No results for input(s): AST, ALT, ALKPHOS, BILITOT, PROT, ALBUMIN in the last 168 hours. No results for  input(s): LIPASE, AMYLASE in the last 168 hours. No results for input(s): AMMONIA in the last 168 hours. Coagulation Profile: No results for input(s): INR, PROTIME in the last 168 hours. Cardiac Enzymes: No results for input(s): CKTOTAL, CKMB, CKMBINDEX, TROPONINI in the last 168 hours. BNP (last 3 results) No results for input(s): PROBNP in the last 8760 hours. HbA1C: No results for input(s): HGBA1C in the last 72 hours. CBG: Recent Labs  Lab 02/21/19 1121 02/21/19 1612 02/21/19 2124 02/22/19 0625 02/22/19 1143  GLUCAP 128* 166* 252* 206* 235*   Lipid Profile: No results for input(s): CHOL, HDL, LDLCALC, TRIG, CHOLHDL, LDLDIRECT in the last 72 hours. Thyroid Function Tests: No results for input(s): TSH, T4TOTAL, FREET4, T3FREE, THYROIDAB in the last 72 hours. Anemia Panel: No results for input(s): VITAMINB12, FOLATE, FERRITIN, TIBC, IRON, RETICCTPCT in the last 72 hours. Sepsis Labs: No results for input(s): PROCALCITON, LATICACIDVEN in the last 168 hours.  Recent Results (from the past 240 hour(s))  SARS Coronavirus 2 [facility] order, Performed in [facility] hospital lab) Nasopharyngeal Nasopharyngeal Swab     Status: None   Collection Time: 02/13/19  6:42 PM   Specimen: Nasopharyngeal Swab  Result Value Ref Range Status  SARS Coronavirus 2 NEGATIVE NEGATIVE Final    Comment: (NOTE) If result is NEGATIVE SARS-CoV-2 target nucleic acids are NOT DETECTED. The SARS-CoV-2 RNA is generally detectable in upper and lower  respiratory specimens during the acute phase of infection. The lowest  concentration of SARS-CoV-2 viral copies this assay can detect is 250  copies / mL. A negative result does not preclude SARS-CoV-2 infection  and should not be used as the sole basis for treatment or other  patient management decisions.  A negative result may occur with  improper specimen collection / handling, submission of specimen other  than nasopharyngeal swab, presence of viral  mutation(s) within the  areas targeted by this assay, and inadequate number of viral copies  (<250 copies / mL). A negative result must be combined with clinical  observations, patient history, and epidemiological information. If result is POSITIVE SARS-CoV-2 target nucleic acids are DETECTED. The SARS-CoV-2 RNA is generally detectable in upper and lower  respiratory specimens dur ing the acute phase of infection.  Positive  results are indicative of active infection with SARS-CoV-2.  Clinical  correlation with patient history and other diagnostic information is  necessary to determine patient infection status.  Positive results do  not rule out bacterial infection or co-infection with other viruses. If result is PRESUMPTIVE POSTIVE SARS-CoV-2 nucleic acids MAY BE PRESENT.   A presumptive positive result was obtained on the submitted specimen  and confirmed on repeat testing.  While 2019 novel coronavirus  (SARS-CoV-2) nucleic acids may be present in the submitted sample  additional confirmatory testing may be necessary for epidemiological  and / or clinical management purposes  to differentiate between  SARS-CoV-2 and other Sarbecovirus currently known to infect humans.  If clinically indicated additional testing with an alternate test  methodology [phone]) is advised. The SARS-CoV-2 RNA is generally  detectable in upper and lower respiratory sp ecimens during the acute  phase of infection. The expected result is Negative. Fact Sheet for Patients:  StrictlyIdeas.no Fact Sheet for Healthcare Providers: BankingDealers.co.za This test is not yet approved or cleared by the [place] FDA and has been authorized for detection and/or diagnosis of SARS-CoV-2 by FDA under an Emergency Use Authorization (EUA).  This EUA will remain in effect (meaning this test can be used) for the duration of the COVID-19 declaration under Section 564(b)(1)  of the Act, 21 U.S.C. section 360bbb-3(b)(1), unless the authorization is terminated or revoked sooner. Performed at [facility], [place] [address], [place], [place] [address]          Radiology Studies: Dg Chest Port 1 View  Result Date: 02/21/2019 CLINICAL DATA:  Hypoxia. EXAM: PORTABLE CHEST 1 VIEW COMPARISON:  Radiographs of February 15, 2019. FINDINGS: Stable cardiomegaly. Atherosclerosis of thoracic aorta is noted. No pneumothorax is noted. Mild right midlung subsegmental atelectasis is noted. Left basilar opacity is noted concerning for atelectasis or infiltrate with associated pleural effusion. Bony thorax is unremarkable. IMPRESSION: Stable left basilar opacity is noted concerning for atelectasis or infiltrate with associated effusion. Mild right midlung subsegmental atelectasis is noted. Aortic Atherosclerosis (ICD10-I70.0). Electronically Signed   By: Marijo Conception M.D.   On: 02/21/2019 10:06   Dg Knee Left Port  Result Date: 02/21/2019 CLINICAL DATA:  Left knee pain without injury. EXAM: PORTABLE LEFT KNEE - 1-2 VIEW COMPARISON:  None. FINDINGS: No fracture or dislocation is noted. Moderate suprapatellar joint effusion is noted. Vascular calcifications are noted. Irregularity is seen involving the medial femoral condyle consistent with  osteochondral injury or osteochondritis dissecans. Moderate degenerative changes seen in this joint space. IMPRESSION: Moderate suprapatellar joint effusion. 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 No acute fracture or dislocation is noted. Electronically Signed   By: Marijo Conception M.D.   On: 02/21/2019 08:28        Scheduled Meds: . albuterol  2.5 mg Nebulization Q6H WA  . arformoterol  15 mcg Nebulization BID  . atorvastatin  40 mg Oral q1800  . azelastine  1 spray Each Nare BID  . budesonide (PULMICORT) nebulizer solution  0.5 mg  Nebulization BID  . docusate sodium  100 mg Oral BID  . furosemide  60 mg Intravenous Q12H  . gabapentin  300 mg Oral TID  . guaiFENesin  600 mg Oral BID  . insulin aspart  0-20 Units Subcutaneous TID WC  . insulin aspart  0-5 Units Subcutaneous QHS  . insulin aspart  3 Units Subcutaneous TID WC  . insulin detemir  30 Units Subcutaneous QHS  . iron polysaccharides  150 mg Oral BID  . macitentan  10 mg Oral Daily  . mouth rinse  15 mL Mouth Rinse BID  . methylPREDNISolone (SOLU-MEDROL) injection  40 mg Intravenous Q6H  . midodrine  5 mg Oral TID WC  . potassium chloride  20 mEq Oral BID  . Selexipag  600 mcg Oral BID  . sertraline  75 mg Oral Daily  . traMADol  50 mg Oral Q6H  . umeclidinium bromide  1 puff Inhalation Daily  . vitamin C  250 mg Oral BID   Continuous Infusions: . azithromycin 500 mg (02/22/19 1123)     LOS: 11 days    Time spent: 25 minutes    Barb Merino, MD [facility] Pager [phone]  If 7PM-7AM, please contact night-coverage [email or web address] Password [facility] 02/22/2019, 11:54 AM

## 2019-02-22 NOTE — Progress Notes (Signed)
Initial Nutrition Assessment  RD working remotely.  DOCUMENTATION CODES:   Obesity unspecified  INTERVENTION:   - Glucerna Shake po BID, each supplement provides 220 kcal and 10 grams of protein  NUTRITION DIAGNOSIS:   Increased nutrient needs related to post-op healing, chronic illness as evidenced by estimated needs.  GOAL:   Patient will meet greater than or equal to 90% of their needs  MONITOR:   PO intake, Supplement acceptance, I & O's, Weight trends, Labs, Skin  REASON FOR ASSESSMENT:   LOS    ASSESSMENT:   73 year old female who presented on 7/31 with closed fracture of right ankle with malunion, failure of fixation. Pt presented for removal of right ankle hardware and fusion of the right ankle. Wound VAC placed. 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   807 - VAC removed  Pt unable to wean from heated high flow.  Reviewed weight history in chart. Pt with a 5.8 kg weight loss since 01/10/19. This is a 5.8% weight loss in 1.5 months which is significant for timeframe.  Unable to reach pt via phone call to room. RD will order an oral nutrition supplement to aid pt in meeting kcal and protein needs.  Per RN edema assessment, pt with non-pitting generalized edema and non-pitting edema to RLE.  Meal Completion: 95-100% x last 8 recorded meals (several meals missing)  Medications reviewed and include: Colace, Lasix, SSI, Novolog 3 units TID with meals, Levemir 30 units daily, Niferex, K-dur 20 mEq BID, vitamin C 250 mg BID, IV abx  Labs reviewed: hemoglobin 8.4 CBG's: 166-252 x 24 hours  UOP: 1950 ml x 24 hours I/O's: -5.1 L since admit  NUTRITION - FOCUSED PHYSICAL EXAM:  Unable to complete at this time. RD working remotely.  Diet Order:   Diet Order            Diet Carb Modified Fluid consistency: Thin; Room service  appropriate? Yes  Diet effective now              EDUCATION NEEDS:   No education needs have been identified at this time  Skin:  Skin Assessment Skin Integrity Issues: Incisions: right ankle  Last BM:  02/21/19  Height:   Ht Readings from Last 1 Encounters:  02/11/19 _0  (1.575 m)    Weight:   Wt Readings from Last 1 Encounters:  02/22/19 78.2 kg    Ideal Body Weight:  50 kg  BMI:  Body mass index is 31.53 kg/m.  Estimated Nutritional Needs:   Kcal:  1600-1800  Protein:  75-90 grams  Fluid:  1.6 L    Gaynell Face, MS, RD, LDN Inpatient Clinical Dietitian Pager: [phone] Weekend/After Hours: [phone]

## 2019-02-22 NOTE — Progress Notes (Signed)
Inpatient Diabetes Program Recommendations  AACE/ADA: New Consensus Statement on Inpatient Glycemic Control (2015)  Target Ranges:  Prepandial:   less than 140 mg/dL      Peak postprandial:   less than 180 mg/dL (1-2 hours)      Critically ill patients:  140 - 180 mg/dL   Lab Results  Component Value Date   GLUCAP 206 (H) 02/22/2019   HGBA1C 6.9 (H) 02/11/2019    Review of Glycemic Control Results for DANELLY, HASSINGER (MRN [id number]) as of 02/22/2019 11:30  Ref. Range 02/21/2019 16:12 02/21/2019 21:24 02/22/2019 06:25  Glucose-Capillary Latest Ref Range: 70 - 99 mg/dL 166 (H) 252 (H) 206 (H)   Diabetes history: Type 2 DM Outpatient Diabetes medications: Humalog 7-49 units TID, Trulicity 1.5 q week, Levemir 30 units QHS Current orders for Inpatient glycemic control: Levemir 30 units QHS, Novolog 3 units TID, Novolog 0-20 units TID, Novolog 0-5 units QHS Solumedrol 40 mg Q6H  Inpatient Diabetes Program Recommendations:    Patient evening CBG 252 mg/dL, however, did not receive meal coverage.  In the setting of steroids, consider increasing Levemir to 34 units QHS and increasing Novolog 5 units TID.   Thanks, Bronson Curb, MSN, RNC-OB Diabetes Coordinator [phone] (8a-5p)

## 2019-02-22 NOTE — Progress Notes (Signed)
NAME:  Amy Shepard, MRN:  [id number], DOB:  1946/05/18, LOS: 35 ADMISSION DATE:  02/11/2019, CONSULTATION DATE:  02/21/2019 REFERRING MD:  Dr. Sloan Leiter, CHIEF COMPLAINT:  Hypoxia  Brief History   73 year old female with chronic hypoxic respiratory failure admitted 7/31 for elective repair of nonhealing right trimalleolar ankle fracture s/p hardware removal and fusion on 7/31 by Dr. Sharol Given, being medically managed by [facility].  Post-operatively has had increased and ongoing oxygenation requirements despite optimized medical therapy with IV steroid taper and diuresis and remains on heated high flow unable to wean.  PCCM consulted for further pulmonary recommendations.   Past Medical History  Chronic hypoxic respiratory failure on home oxygen 4L, COPD, CAD, prior non-small cell lung cancer in January 2010 s/p right lower lobe superior segmentectomy with LN dissection and chemotherapy with carboplatin and etopiside, moderate pulmonary hypertension, chronic diastolic heart failure, HTN, HLD, DM, CKD stage III, anemia of chronic disease, right charcot ankle fracture  She is followed by Dr. Aundra Dubin for HF/ pulmonary hypertension  Significant Hospital Events   7/31 Admitted/ OR 8/10 PCCM consult.  Add abx, steroids, changed nebs, diuresis   Consults:  Ortho TRH  Procedures:  7/31- OR for Charcot Collapse Right Ankle with Failure Fixation  8/3 TTE >>EF 55 to 33%, mod RV systolic dysfx with D shaped intraventricular septum, mild AS, PAS 88 mmHg  Significant Diagnostic Tests:   Micro Data:  8/2 SARS CoV-2 >> negative 7/31 right ankle tissue cx >> neg  Antimicrobials:  7/30 vanc OR 8/10 azithro >>  Interim history/subjective:  Remains afebrile/ normal WBC Patient reports she feels good- looks good, less wheeze, no change in cough.  Requirements unchanged on heated HFNC at 100% and 30L.   Did refuse BiPAP last night- said she did not need it, no increased WOB Neg 1L/ 24 hour, net neg -4.8.   No weight.    Objective   Blood pressure 122/60, pulse 91, temperature 97.7 F (36.5 C), temperature source Oral, resp. rate 19, height _0  (1.575 m), weight 77.1 kg, SpO2 90 %.    FiO2 (%):  [100 %] 100 %   Intake/Output Summary (Last 24 hours) at 02/22/2019 1049 Last data filed at 02/22/2019 1023 Gross per 24 hour  Intake 910 ml  Output 1050 ml  Net -140 ml   Filed Weights   02/11/19 0553  Weight: 77.1 kg   Examination: General:  Very pleasant elderly female sitting in bedside chair in NAD HEENT: MM pink/moist Neuro: a/o x 3, MAE CV: RR PULM:  Clear anteriorly, faint exp wheeze on right, posterior bibasilar rales L>R GI: soft, bs+, active - feels full around abd Extremities: warm/dry, trace LE edema  Skin: no rashes   Resolved Hospital Problem list    Assessment & Plan:   Acute on chronic hypoxic, hypercapnic respiratory failure. - titrate oxygen to keep SpO2 88 to 94%, home baseline of 4L - Bipap prn - if no improvement in O2 requirements despite maximum medical therapy, consider CTA PE if hypoxia refractory given D-shaped septum on TTE- which also could related to her pulm htn/ RV failure  COPD exacerbation in setting of COPD with emphysema and positive bronchodilator response. - continue nebulizer brovana and pulmicort - continue incruse, albuterol q 6 and prn  - continue zithromax - continue solumedrol - bronchial hygiene- IS/ flutter/ PT  Acute on chronic cor pulmonale. - continue IV lasix, add dose of diamox - strict I/O's, daily weights - ongoing midodrine  Pulmonary  hypertension. - likely WHO group 2 and 3, uncertain whether she would fit into category of WHO group 1.  Has been managed by CHF team as an outpt for pulmonary hypertension medications - consider HF team input for further management of pulmonary hypertension meds  Hx of NSCLC s/p RLL superior segment resection in 2010, and chemotherapy in 2010. - followed by oncology as outpt  Iron  deficiency anemia. - niferex with vit c   Goals of care - approached by patient.  Patient is not sure what she wants at this time.  Would like to talk with her kids about it.  Given her lung disease and pulmonary hypertension, would recommend against invasive support as intubation/ MV would likely cause hemodynamic instability if not cardiac collapse.  Would likely benefit from PMT consult as well.   Labs   CBC: Recent Labs  Lab 02/17/19 0224 02/18/19 0240 02/19/19 0229 02/20/19 0235 02/22/19 0736  WBC 8.9 9.6 8.8 8.1 8.5  NEUTROABS 6.9 7.4 6.7 6.4 8.0*  HGB 7.3* 7.4* 7.3* 7.8* 8.4*  HCT 24.5* 25.1* 24.2* 25.8* 28.3*  MCV 83.1 84.2 82.0 82.2 83.0  PLT 219 199 186 184 803    Basic Metabolic Panel: Recent Labs  Lab 02/17/19 0224 02/18/19 0240 02/19/19 0229 02/20/19 0235 02/22/19 0736  NA 136 139 138 142 139  K 4.2 4.4 4.1 3.9 4.1  CL 90* 93* 93* 94* 92*  CO2 36* 36* 35* 36* 35*  GLUCOSE 202* 174* 143* 117* 205*  BUN 61* 69* 63* 53* 56*  CREATININE 1.69* 1.81* 1.66* 1.32* 1.52*  CALCIUM 8.9 8.8* 8.8* 9.0 9.3   GFR: Estimated Creatinine Clearance: 32.2 mL/min (A) (by C-G formula based on SCr of 1.52 mg/dL (H)). Recent Labs  Lab 02/18/19 0240 02/19/19 0229 02/20/19 0235 02/22/19 0736  WBC 9.6 8.8 8.1 8.5    Liver Function Tests: No results for input(s): AST, ALT, ALKPHOS, BILITOT, PROT, ALBUMIN in the last 168 hours. No results for input(s): LIPASE, AMYLASE in the last 168 hours. No results for input(s): AMMONIA in the last 168 hours.  ABG    Component Value Date/Time   PHART 7.405 02/21/2019 0940   PCO2ART 62.3 (H) 02/21/2019 0940   PO2ART 40.5 (L) 02/21/2019 0940   HCO3 38.2 (H) 02/21/2019 0940   TCO2 33 (H) 11/24/2018 0552   ACIDBASEDEF 0.2 07/18/2008 0954   O2SAT 69.8 02/21/2019 0940     Coagulation Profile: No results for input(s): INR, PROTIME in the last 168 hours.  Cardiac Enzymes: No results for input(s): CKTOTAL, CKMB, CKMBINDEX, TROPONINI  in the last 168 hours.  HbA1C: Hgb A1c MFr Bld  Date/Time Value Ref Range Status  02/11/2019 06:07 AM 6.9 (H) 4.8 - 5.6 % Final    Comment:    (NOTE) Pre diabetes:          5.7%-6.4% Diabetes:              >6.4% Glycemic control for   <7.0% adults with diabetes   04/11/2018 07:34 AM 7.2 (H) 4.8 - 5.6 % Final    Comment:    (NOTE) Pre diabetes:          5.7%-6.4% Diabetes:              >6.4% Glycemic control for   <7.0% adults with diabetes     CBG: Recent Labs  Lab 02/21/19 0601 02/21/19 1121 02/21/19 1612 02/21/19 2124 02/22/19 0625  GLUCAP 85 128* 166* 252* 206*  Kennieth Rad, MSN, AGACNP-BC [facility] Pgr: [phone] or if no answer [phone] 02/22/2019, 10:49 AM

## 2019-02-22 NOTE — Progress Notes (Signed)
Physical Therapy Treatment Patient Details Name: Amy Shepard MRN: [id number] DOB: Jun 12, 1946 Today's Date: 02/22/2019    History of Present Illness 73 yo 10wks s/p trimalleolar Rt ankle fx with syndesmotic disruption. 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 7/31 hardware removal with ankle fusion, wound VAC. PMHx: lung CA, CAD, spinal fusion, depression, CHF, pulmonary HTN, CKD    PT Comments    Pt admitted with above diagnosis. Pt currently with functional limitations due to balance and endurance deficits. Pt on 30 LO2 on HFNC oxygen therapy with 100% FiO2.  Pt was able to perform exercises on all 4 extremities but needs frequent rest breaks as pt desats to 87% with only 10 reps of exercises.  Pt desat to 82% when nurse came in to get her on scales to weigh her and took 2 min to return to 90%.   Pt will benefit from skilled PT to increase their independence and safety with mobility to allow discharge to the venue listed below.     Follow Up Recommendations  SNF     Equipment Recommendations  None recommended by PT    Recommendations for Other Services OT consult     Precautions / Restrictions Precautions Precautions: Fall Precaution Comments: watch O2 Required Braces or Orthoses: Other Brace Other Brace: CAM boot RLE Restrictions Weight Bearing Restrictions: Yes RUE Weight Bearing: Weight bearing as tolerated LUE Weight Bearing: Weight bearing as tolerated RLE Weight Bearing: Non weight bearing LLE Weight Bearing: Weight bearing as tolerated Other Position/Activity Restrictions: WBAT for transfer only with CAM boot    Mobility  Bed Mobility               General bed mobility comments: Pt was in chair on arrival  Transfers Overall transfer level: Needs assistance Equipment used: 2 person hand held assist Transfers: Sit to/from [facility] Sit to Stand: Min assist;Min guard         General transfer comment: Nurse  asked PT to assist pt onto scale to weigh pt.  Pt stood to scale and weighed wiht min guard assist and then sat back down in chair.    Ambulation/Gait             General Gait Details: unable per orders   Stairs             Wheelchair Mobility    Modified Rankin (Stroke Patients Only)       Balance           Standing balance support: Bilateral upper extremity supported;During functional activity Standing balance-Leahy Scale: Poor Standing balance comment: UE support in standing                            Cognition Arousal/Alertness: Awake/alert Behavior During Therapy: WFL for tasks assessed/performed Overall Cognitive Status: Within Functional Limits for tasks assessed                                 General Comments: able to provide history, answer questions once more alert and EOB      Exercises General Exercises - Upper Extremity Shoulder Flexion: AROM;Both;10 reps;Seated Shoulder Horizontal ADduction: AROM;Both;10 reps;Seated Elbow Flexion: AROM;Both;10 reps;Seated General Exercises - Lower Extremity Ankle Circles/Pumps: AROM;Both;20 reps;Seated Quad Sets: AROM;Both;10 reps;Seated Gluteal Sets: AROM;Both;10 reps;Seated Heel Slides: AROM;Both;10 reps;Seated Straight Leg Raises: AROM;Both;10 reps;Seated Other Exercises Other Exercises: Pt performed chest press x  10    General Comments General comments (skin integrity, edema, etc.): Pt on 30L HFNC with 100% O2.  Desats to 82% with standing and 87% with minimal exercises needing frequent rest breaks.        Pertinent Vitals/Pain Pain Assessment: Faces Faces Pain Scale: Hurts little more Pain Location: R foot with weight bearing Pain Descriptors / Indicators: Grimacing;Moaning Pain Intervention(s): Limited activity within patient's tolerance;Monitored during session;Repositioned    Home Living                      Prior Function            PT Goals  (current goals can now be found in the care plan section) Acute Rehab PT Goals Patient Stated Goal: to get strong enough to get home  Progress towards PT goals: Progressing toward goals    Frequency    Min 2X/week      PT Plan Current plan remains appropriate    Co-evaluation              AM-PAC PT "6 Clicks" Mobility   Outcome Measure  Help needed turning from your back to your side while in a flat bed without using bedrails?: A Little Help needed moving from lying on your back to sitting on the side of a flat bed without using bedrails?: A Little Help needed moving to and from a bed to a chair (including a wheelchair)?: A Little Help needed standing up from a chair using your arms (e.g., wheelchair or bedside chair)?: A Little Help needed to walk in hospital room?: Total Help needed climbing 3-5 steps with a railing? : Total 6 Click Score: 14    End of Session Equipment Utilized During Treatment: Oxygen;Other (comment)(RLE camboot) Activity Tolerance: Patient tolerated treatment well Patient left: with call bell/phone within reach;in chair Nurse Communication: Mobility status;Precautions;Weight bearing status PT Visit Diagnosis: Muscle weakness (generalized) (M62.81);Other abnormalities of gait and mobility (R26.89)     Time: 1049-1105 PT Time Calculation (min) (ACUTE ONLY): 16 min  Charges:  $Therapeutic Exercise: 8-22 mins                     [place] Pager:  [phone]  Office:  [place] 02/22/2019, 11:20 AM

## 2019-02-22 NOTE — Progress Notes (Signed)
Patient was desatting to the low 80's and 70's. Despite IS, cough and deep breathing encouragement by nurse and breathing treatment by RT, O2 Sat didn't stay above 90% for more than 10 min. Non-rebreather mask initiated with saturation currently at 96%.

## 2019-02-22 NOTE — Progress Notes (Signed)
Occupational Therapy Treatment Patient Details Name: Amy Shepard MRN: [id number] DOB: Aug 30, 1945 Today's Date: 02/22/2019    History of present illness 73 yo 10wks s/p trimalleolar Rt ankle fx with syndesmotic disruption. 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 7/31 hardware removal with ankle fusion, wound VAC. PMHx: lung CA, CAD, spinal fusion, depression, CHF, pulmonary HTN, CKD   OT comments  Patient seated in recliner, agreeable to OT.  Continues to require max assist for LB ADLs.  Majority of session focused on strength/endurance retraining.  Initiated HEP, via medbridge for UE AROM exercises.  Reviewed PLB techniques.  Noted O2 saturations dropped to 88% during chair pushups, but maintained 92% or greater during UE exercises--on 30L HFNC at 100% FiO2.  Will follow acutely.    Follow Up Recommendations  SNF    Equipment Recommendations  3 in 1 bedside commode    Recommendations for Other Services      Precautions / Restrictions Precautions Precautions: Fall Precaution Comments: watch O2 Required Braces or Orthoses: Other Brace Other Brace: CAM boot RLE Restrictions Weight Bearing Restrictions: Yes RLE Weight Bearing: Non weight bearing(WBAT with CAM boot transfers only) Other Position/Activity Restrictions: WBAT for transfer only with CAM boot       Mobility Bed Mobility               General bed mobility comments: Pt was in chair on arrival  Transfers                      Balance                                           ADL either performed or assessed with clinical judgement   ADL Overall ADL's : Needs assistance/impaired                     Lower Body Dressing: Maximal assistance;Sitting/lateral leans Lower Body Dressing Details (indicate cue type and reason): able to manage L sock                General ADL Comments: session focused on UE strengthening exercises      Vision        Perception     Praxis      Cognition Arousal/Alertness: Awake/alert Behavior During Therapy: WFL for tasks assessed/performed Overall Cognitive Status: Within Functional Limits for tasks assessed                                          Exercises Exercises: General Upper Extremity;Other exercises General Exercises - Upper Extremity Shoulder Flexion: AROM;Both;10 reps;Seated(2 sets ) Other Exercises Other Exercises: AROM, both, 10 reps 2 sets overhead press  Other Exercises: AROM, both, 10 reps 2 sets foward press/shoulder retraction  Other Exercises: chair pushups x 5 reps    Shoulder Instructions       General Comments Pt on 30L HFNC with 100% FiO2; desats to 88% with chair pushups, but otherwise maintained O2 sats >92% during exercises     Pertinent Vitals/ Pain       Pain Assessment: Faces Faces Pain Scale: No hurt  Home Living  Prior Functioning/Environment              Frequency  Min 2X/week        Progress Toward Goals  OT Goals(current goals can now be found in the care plan section)  Progress towards OT goals: Progressing toward goals  Acute Rehab OT Goals Patient Stated Goal: to get strong enough to get home  OT Goal Formulation: With patient  Plan Discharge plan remains appropriate;Frequency remains appropriate    Co-evaluation                 AM-PAC OT "6 Clicks" Daily Activity     Outcome Measure   Help from another person eating meals?: None Help from another person taking care of personal grooming?: A Little Help from another person toileting, which includes using toliet, bedpan, or urinal?: A Lot Help from another person bathing (including washing, rinsing, drying)?: A Lot Help from another person to put on and taking off regular upper body clothing?: A Lot Help from another person to put on and taking off regular lower body clothing?: A Lot 6  Click Score: 15    End of Session Equipment Utilized During Treatment: Oxygen  OT Visit Diagnosis: Other abnormalities of gait and mobility (R26.89);Pain Pain - Right/Left: Right Pain - part of body: Ankle and joints of foot   Activity Tolerance Patient tolerated treatment well   Patient Left in chair;with call bell/phone within reach   Nurse Communication Mobility status        Time: 4196-2229 OT Time Calculation (min): 28 min  Charges: OT General Charges $OT Visit: 1 Visit OT Treatments $Self Care/Home Management : 8-22 mins $Therapeutic Exercise: 8-22 mins  Delight Stare, OT Acute Rehabilitation Services Pager [phone] Office [phone]    Delight Stare 02/22/2019, 5:15 PM

## 2019-02-23 DIAGNOSIS — I5033 Acute on chronic diastolic (congestive) heart failure: Secondary | ICD-10-CM

## 2019-02-23 DIAGNOSIS — I10 Essential (primary) hypertension: Secondary | ICD-10-CM

## 2019-02-23 DIAGNOSIS — E118 Type 2 diabetes mellitus with unspecified complications: Secondary | ICD-10-CM

## 2019-02-23 DIAGNOSIS — J9601 Acute respiratory failure with hypoxia: Secondary | ICD-10-CM

## 2019-02-23 DIAGNOSIS — Z794 Long term (current) use of insulin: Secondary | ICD-10-CM

## 2019-02-23 LAB — GLUCOSE, CAPILLARY
Glucose-Capillary: 216 mg/dL — ABNORMAL HIGH (ref 70–99)
Glucose-Capillary: 296 mg/dL — ABNORMAL HIGH (ref 70–99)
Glucose-Capillary: 333 mg/dL — ABNORMAL HIGH (ref 70–99)
Glucose-Capillary: 253 mg/dL — ABNORMAL HIGH (ref 70–99)

## 2019-02-23 LAB — BASIC METABOLIC PANEL
Anion gap: 11 (ref 5–15)
BUN: 61 mg/dL — ABNORMAL HIGH (ref 8–23)
CO2: 32 mmol/L (ref 22–32)
Calcium: 9.2 mg/dL (ref 8.9–10.3)
Chloride: 95 mmol/L — ABNORMAL LOW (ref 98–111)
Creatinine, Ser: 1.51 mg/dL — ABNORMAL HIGH (ref 0.44–1.00)
GFR calc Af Amer: 40 mL/min — ABNORMAL LOW (ref >60)
GFR calc non Af Amer: 34 mL/min — ABNORMAL LOW (ref >60)
Glucose, Bld: 271 mg/dL — ABNORMAL HIGH (ref 70–99)
Potassium: 3.8 mmol/L (ref 3.5–5.1)
Sodium: 138 mmol/L (ref 135–145)

## 2019-02-23 LAB — CBC
HCT: 25.5 % — ABNORMAL LOW (ref 36.0–46.0)
Hemoglobin: 7.7 g/dL — ABNORMAL LOW (ref 12.0–15.0)
MCH: 25 pg — ABNORMAL LOW (ref 26.0–34.0)
MCHC: 30.2 g/dL (ref 30.0–36.0)
MCV: 82.8 fL (ref 80.0–100.0)
Platelets: 224 10*3/uL (ref 150–400)
RBC: 3.08 MIL/uL — ABNORMAL LOW (ref 3.87–5.11)
RDW: 18.5 % — ABNORMAL HIGH (ref 11.5–15.5)
WBC: 10.8 10*3/uL — ABNORMAL HIGH (ref 4.0–10.5)
nRBC: 0 % (ref 0.0–0.2)

## 2019-02-23 MED ORDER — INSULIN DETEMIR 100 UNIT/ML ~~LOC~~ SOLN
35.0000 [IU] | Freq: Every day | SUBCUTANEOUS | Status: DC
  Administered 2019-02-23: 35 [IU] via SUBCUTANEOUS
  Filled 2019-02-23 (×2): qty 0.35

## 2019-02-23 MED ORDER — INSULIN ASPART 100 UNIT/ML ~~LOC~~ SOLN
5.0000 [IU] | Freq: Three times a day (TID) | SUBCUTANEOUS | Status: DC
  Administered 2019-02-23 – 2019-02-26 (×9): 5 [IU] via SUBCUTANEOUS

## 2019-02-23 NOTE — Progress Notes (Signed)
PROGRESS NOTE    Amy Shepard  SWF:[id number] DOB: 1945-12-17 DOA: 02/11/2019 PCP: Maurice Small, MD   Brief Narrative:  73 year old with diabetes mellitus type 2, essential hypertension, diastolic CHF, chronic respiratory failure, pulmonary nodule, lung cancer, CKD stage III brought to the hospital for elective nonhealing right trimalleolar fracture.  Underwent hardware removal and fusion of the ankle but postoperatively developed hypoxia requiring high flow oxygen therefore medical team consulted.  Pulmonology was also consulted.   Assessment & Plan:   Active Problems:   Chronic obstructive pulmonary disease ([name])   Type 2 diabetes mellitus with complication, with long-term current use of insulin ([name])   HTN (hypertension)   Acute respiratory failure with hypoxia ([name])   Chronic diastolic CHF (congestive heart failure) ([name])   Pulmonary fibrosis ([name])   Acute on chronic postoperative respiratory failure ([name])   Benign hypertensive heart and kidney disease with diastolic CHF, NYHA class II and CKD stage III ([name])   Charcot ankle, right   Pain from implanted hardware   Closed right ankle fracture, with malunion, subsequent encounter   Right upper lobe pulmonary nodule  Acute on chronic hypoxic/hypercapnic respiratory failure Postop respiratory insufficiency - Continue azithromycin.  Aggressive bronchodilator treatment.  Solu-Medrol 40 mg every 6 hours. - Pulmonary toilet, incentive spirometer.  Repeat chest x-ray  Acute congestive heart failure with preserved ejection fraction 55 to 60%, class IV - Lasix 60 mg twice daily.  On Diamox for 1 more day.  Diabetes mellitus type 2 Peripheral neuropathy - On Levemir 30 units at bedtime, sliding scale.  Increase to 35 units daily.  NovoLog 5 units 3 times daily -Gabapentin 3 times daily.  CKD stage III -Arm baseline of 1.5  Anemia of chronic disease and Iron Def -Continue iron supplements.  Monitor hemoglobin.  Appears to be  stable around baseline of 7.8  Nonunion of the right ankle fracture -Management per surgery.   DVT prophylaxis: Per primary team   Subjective: Feels okay, continues to use incentive spirometer.  Does have shortness of breath with minimal exertion.  Review of Systems Otherwise negative except as per HPI, including: General: Denies fever, chills, night sweats or unintended weight loss. Resp: Denies cough, wheezing, Cardiac: Denies chest pain, palpitations, orthopnea, paroxysmal nocturnal dyspnea. GI: Denies abdominal pain, nausea, vomiting, diarrhea or constipation GU: Denies dysuria, frequency, hesitancy or incontinence MS: Denies muscle aches, joint pain or swelling Neuro: Denies headache, neurologic deficits (focal weakness, numbness, tingling), abnormal gait Psych: Denies anxiety, depression, SI/HI/AVH Skin: Denies new rashes or lesions ID: Denies sick contacts, exotic exposures, travel  Objective: Vitals:   02/23/19 0717 02/23/19 0720 02/23/19 0721 02/23/19 0722  BP:      Pulse:      Resp:      Temp:      TempSrc:      SpO2: 100% 100% 100% 100%  Weight:      Height:        Intake/Output Summary (Last 24 hours) at 02/23/2019 0740 Last data filed at 02/23/2019 0543 Gross per 24 hour  Intake 470 ml  Output 2700 ml  Net -2230 ml   Filed Weights   02/11/19 0553 02/22/19 1100 02/23/19 0432  Weight: 77.1 kg 78.2 kg 78 kg    Examination:  General exam: Appears calm and comfortable on high flow Respiratory system: Rhonchi bilaterally Cardiovascular system: S1 & S2 heard, RRR. No JVD, murmurs, rubs, gallops or clicks. No pedal edema. Gastrointestinal system: Abdomen is nondistended, soft and nontender. No organomegaly or  masses felt. Normal bowel sounds heard. Central nervous system: Alert and oriented. No focal neurological deficits. Extremities: Symmetric 5 x 5 power. Skin: Right lower extremity dressing noted Psychiatry: Judgement and insight appear normal. Mood &  affect appropriate.   External female catheter in place  Data Reviewed:   CBC: Recent Labs  Lab 02/17/19 0224 02/18/19 0240 02/19/19 0229 02/20/19 0235 02/22/19 0736 02/23/19 0231  WBC 8.9 9.6 8.8 8.1 8.5 10.8*  NEUTROABS 6.9 7.4 6.7 6.4 8.0*  --   HGB 7.3* 7.4* 7.3* 7.8* 8.4* 7.7*  HCT 24.5* 25.1* 24.2* 25.8* 28.3* 25.5*  MCV 83.1 84.2 82.0 82.2 83.0 82.8  PLT 219 199 186 184 224 161   Basic Metabolic Panel: Recent Labs  Lab 02/18/19 0240 02/19/19 0229 02/20/19 0235 02/22/19 0736 02/23/19 0231  NA 139 138 142 139 138  K 4.4 4.1 3.9 4.1 3.8  CL 93* 93* 94* 92* 95*  CO2 36* 35* 36* 35* 32  GLUCOSE 174* 143* 117* 205* 271*  BUN 69* 63* 53* 56* 61*  CREATININE 1.81* 1.66* 1.32* 1.52* 1.51*  CALCIUM 8.8* 8.8* 9.0 9.3 9.2   GFR: Estimated Creatinine Clearance: 32.6 mL/min (A) (by C-G formula based on SCr of 1.51 mg/dL (H)). Liver Function Tests: No results for input(s): AST, ALT, ALKPHOS, BILITOT, PROT, ALBUMIN in the last 168 hours. No results for input(s): LIPASE, AMYLASE in the last 168 hours. No results for input(s): AMMONIA in the last 168 hours. Coagulation Profile: No results for input(s): INR, PROTIME in the last 168 hours. Cardiac Enzymes: No results for input(s): CKTOTAL, CKMB, CKMBINDEX, TROPONINI in the last 168 hours. BNP (last 3 results) No results for input(s): PROBNP in the last 8760 hours. HbA1C: No results for input(s): HGBA1C in the last 72 hours. CBG: Recent Labs  Lab 02/22/19 0625 02/22/19 1143 02/22/19 1558 02/22/19 2122 02/23/19 0629  GLUCAP 206* 235* 255* 189* 216*   Lipid Profile: No results for input(s): CHOL, HDL, LDLCALC, TRIG, CHOLHDL, LDLDIRECT in the last 72 hours. Thyroid Function Tests: No results for input(s): TSH, T4TOTAL, FREET4, T3FREE, THYROIDAB in the last 72 hours. Anemia Panel: No results for input(s): VITAMINB12, FOLATE, FERRITIN, TIBC, IRON, RETICCTPCT in the last 72 hours. Sepsis Labs: No results for  input(s): PROCALCITON, LATICACIDVEN in the last 168 hours.  Recent Results (from the past 240 hour(s))  SARS Coronavirus 2 [facility] order, Performed in [facility] hospital lab) Nasopharyngeal Nasopharyngeal Swab     Status: None   Collection Time: 02/13/19  6:42 PM   Specimen: Nasopharyngeal Swab  Result Value Ref Range Status   SARS Coronavirus 2 NEGATIVE NEGATIVE Final    Comment: (NOTE) If result is NEGATIVE SARS-CoV-2 target nucleic acids are NOT DETECTED. The SARS-CoV-2 RNA is generally detectable in upper and lower  respiratory specimens during the acute phase of infection. The lowest  concentration of SARS-CoV-2 viral copies this assay can detect is 250  copies / mL. A negative result does not preclude SARS-CoV-2 infection  and should not be used as the sole basis for treatment or other  patient management decisions.  A negative result may occur with  improper specimen collection / handling, submission of specimen other  than nasopharyngeal swab, presence of viral mutation(s) within the  areas targeted by this assay, and inadequate number of viral copies  (<250 copies / mL). A negative result must be combined with clinical  observations, patient history, and epidemiological information. If result is POSITIVE SARS-CoV-2 target nucleic acids are DETECTED. The SARS-CoV-2 RNA  is generally detectable in upper and lower  respiratory specimens dur ing the acute phase of infection.  Positive  results are indicative of active infection with SARS-CoV-2.  Clinical  correlation with patient history and other diagnostic information is  necessary to determine patient infection status.  Positive results do  not rule out bacterial infection or co-infection with other viruses. If result is PRESUMPTIVE POSTIVE SARS-CoV-2 nucleic acids MAY BE PRESENT.   A presumptive positive result was obtained on the submitted specimen  and confirmed on repeat testing.  While 2019 novel coronavirus   (SARS-CoV-2) nucleic acids may be present in the submitted sample  additional confirmatory testing may be necessary for epidemiological  and / or clinical management purposes  to differentiate between  SARS-CoV-2 and other Sarbecovirus currently known to infect humans.  If clinically indicated additional testing with an alternate test  methodology [phone]) is advised. The SARS-CoV-2 RNA is generally  detectable in upper and lower respiratory sp ecimens during the acute  phase of infection. The expected result is Negative. Fact Sheet for Patients:  StrictlyIdeas.no Fact Sheet for Healthcare Providers: BankingDealers.co.za This test is not yet approved or cleared by the [place] FDA and has been authorized for detection and/or diagnosis of SARS-CoV-2 by FDA under an Emergency Use Authorization (EUA).  This EUA will remain in effect (meaning this test can be used) for the duration of the COVID-19 declaration under Section 564(b)(1) of the Act, 21 U.S.C. section 360bbb-3(b)(1), unless the authorization is terminated or revoked sooner. Performed at [facility], [place] [address] [place], [place] [address]          Radiology Studies: Dg Chest Port 1 View  Result Date: 02/21/2019 CLINICAL DATA:  Hypoxia. EXAM: PORTABLE CHEST 1 VIEW COMPARISON:  Radiographs of February 15, 2019. FINDINGS: Stable cardiomegaly. Atherosclerosis of thoracic aorta is noted. No pneumothorax is noted. Mild right midlung subsegmental atelectasis is noted. Left basilar opacity is noted concerning for atelectasis or infiltrate with associated pleural effusion. Bony thorax is unremarkable. IMPRESSION: Stable left basilar opacity is noted concerning for atelectasis or infiltrate with associated effusion. Mild right midlung subsegmental atelectasis is noted. Aortic Atherosclerosis (ICD10-I70.0). Electronically Signed   By: Marijo Conception M.D.   On: 02/21/2019 10:06    Dg Knee Left Port  Result Date: 02/21/2019 CLINICAL DATA:  Left knee pain without injury. EXAM: PORTABLE LEFT KNEE - 1-2 VIEW COMPARISON:  None. FINDINGS: No fracture or dislocation is noted. Moderate suprapatellar joint effusion is noted. Vascular calcifications are noted. Irregularity is seen involving the medial femoral condyle consistent with osteochondral injury or osteochondritis dissecans. Moderate degenerative changes seen in this joint space. IMPRESSION: Moderate suprapatellar joint effusion. 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 No acute fracture or dislocation is noted. Electronically Signed   By: Marijo Conception M.D.   On: 02/21/2019 08:28        Scheduled Meds: . acetaZOLAMIDE  250 mg Oral BID  . albuterol  2.5 mg Nebulization TID  . arformoterol  15 mcg Nebulization BID  . atorvastatin  40 mg Oral q1800  . azelastine  1 spray Each Nare BID  . budesonide (PULMICORT) nebulizer solution  0.5 mg Nebulization BID  . docusate sodium  100 mg Oral BID  . feeding supplement (GLUCERNA SHAKE)  237 mL Oral BID BM  . furosemide  60 mg Intravenous Q12H  . gabapentin  300 mg Oral TID  . guaiFENesin  600 mg Oral BID  . insulin aspart  0-20 Units Subcutaneous TID WC  . insulin aspart  0-5 Units Subcutaneous QHS  . insulin aspart  3 Units Subcutaneous TID WC  . insulin detemir  30 Units Subcutaneous QHS  . iron polysaccharides  150 mg Oral BID  . macitentan  10 mg Oral Daily  . mouth rinse  15 mL Mouth Rinse BID  . methylPREDNISolone (SOLU-MEDROL) injection  40 mg Intravenous Q6H  . midodrine  5 mg Oral TID WC  . potassium chloride  20 mEq Oral BID  . Selexipag  600 mcg Oral BID  . sertraline  75 mg Oral Daily  . traMADol  50 mg Oral Q6H  . umeclidinium bromide  1 puff Inhalation Daily  . vitamin C  250 mg Oral BID   Continuous Infusions: . azithromycin 500 mg (02/22/19 1123)      LOS: 12 days   Time spent= 35 mins     Arsenio Loader, MD [facility]  If 7PM-7AM, please contact night-coverage [email or web address] 02/23/2019, 7:40 AM

## 2019-02-23 NOTE — Consult Note (Addendum)
Advanced Heart Failure Team Consult Note   Primary Physician: Maurice Small, MD PCP-Cardiologist: Dr Aundra Dubin.   Reason for Consultation: Heart Failure   HPI:    Amy Shepard is seen today for evaluation of heart failure at the request of Dr Sharol Given.   73 year old with a history of hypoxemic respiratory failure, chronic diastolic CHF, pulmonary hypertension, non small cell lung cancer, CKD stage 3, and right charcot ankle fracture.     Pulmonary HTN treated with opsumit and selexipag + home oxygen. Intolerant tadalafil.   Admitted with 7/31 for scheduled removal of hardware and fusion of r ankle fracture. Post operative course was complicated by acute/chronic respiratory failure and AECOPD. PCCM consulted. Placed on HFNC.  Placed bronchodilators, steroids, and antibiotics.    Initially placed on oral diuretics until 8/5 then placed on lasix 40 mg twice a day through 8/7 then placed back on oral diuretics for 3 days then back on IV lasix 60 mg twice a day.    HFNC weaned today to 20 liters.  Feeling better today but remains SOB with exertion.   Echo 02/2019 55-60% , RV moderately reduced, D shaped septum, PAP 88 mmhg.   Echo 03/2018 EF 55-60%, septal flattening, PASP 81 mmHg, RV mildly dilated with moderately decreased systolic function.   Review of Systems: [y] = yes, _0  = no   . General: Weight gain _1 ; Weight loss _2 ; Anorexia _3 ; Fatigue [Y ]; Fever _4 ; Chills _5 ; Weakness _6   . Cardiac: Chest pain/pressure _7 ; Resting SOB _8 ; Exertional SOB [ Y]; Orthopnea _9 ; Pedal Edema _10 ; Palpitations _11 ; Syncope _12 ; Presyncope _13 ; Paroxysmal nocturnal dyspnea_14   . Pulmonary: Cough _15 ; Wheezing_16 ; Hemoptysis_17 ; Sputum _18 ; Snoring _19   . GI: Vomiting_20 ; Dysphagia_21 ; Melena_22 ; Hematochezia _23 ; Heartburn_24 ; Abdominal pain _25 ; Constipation _26 ; Diarrhea _27 ; BRBPR _28   . GU: Hematuria_29 ; Dysuria _30 ; Nocturia_31   . Vascular: Pain in legs with walking _32 ; Pain in  feet with lying flat _33 ; Non-healing sores _34 ; Stroke _35 ; TIA _36 ; Slurred speech _37 ;  . Neuro: Headaches_38 ; Vertigo_39 ; Seizures_40 ; Paresthesias_41 ;Blurred vision _42 ; Diplopia _43 ; Vision changes _44   . Ortho/Skin: Arthritis _45 ; Joint pain [Y ]; Muscle pain _46 ; Joint swelling _47 ; Back Pain [Y ]; Rash _48   . Psych: Depression_49 ; Anxiety_50   . Heme: Bleeding problems _51 ; Clotting disorders _52 ; Anemia _53   . Endocrine: Diabetes [ Y]; Thyroid dysfunction_54   Home Medications Prior to Admission medications   Medication Sig Start Date End Date Taking? Authorizing Provider  acetaminophen (TYLENOL) 325 MG tablet Take 650 mg by mouth every 6 (six) hours as needed for moderate pain or fever.    Yes [provider]  atorvastatin (LIPITOR) 40 MG tablet Take 1 tablet (40 mg total) by mouth daily. 04/20/18 01/11/20 Yes Larey Dresser, MD  azelastine (ASTELIN) 0.1 % nasal spray Place 1 spray into both nostrils 2 (two) times daily. Use in each nostril as directed 12/28/18  Yes Martyn Ehrich, NP  doxycycline (VIBRAMYCIN) 100 MG capsule Take 1 capsule (100 mg total) by mouth 2 (two) times daily. 01/24/19  Yes Rayburn, Neta Mends, PA-C  furosemide (LASIX) 40 MG tablet Take 1.5 tablets (60 mg total) by mouth 2 (two)  times daily. Patient taking differently: Take 40 mg by mouth 2 (two) times daily.  01/11/19  Yes Larey Dresser, MD  gabapentin (NEURONTIN) 300 MG capsule Take 1 capsule (300 mg total) by mouth at bedtime. Patient taking differently: Take 300 mg by mouth 3 (three) times daily.  11/30/18  Yes Domenic Polite, MD  Insulin Detemir (LEVEMIR FLEXTOUCH) 100 UNIT/ML Pen Inject 30 Units into the skin at bedtime. 12/14/18  Yes Love, Ivan Anchors, PA-C  insulin lispro (HUMALOG) 100 UNIT/ML injection Inject 2-11 Units into the skin 3 (three) times daily before meals.    Yes [provider]  macitentan (OPSUMIT) 10 MG tablet Take 1 tablet (10 mg total) by mouth daily. 08/25/18  Yes  Larey Dresser, MD  midodrine (PROAMATINE) 5 MG tablet Take 1 tablet (5 mg total) by mouth 3 (three) times daily with meals. 12/14/18  Yes Love, Ivan Anchors, PA-C  mometasone-formoterol (DULERA) 200-5 MCG/ACT AERO Inhale 2 puffs into the lungs 2 (two) times daily. 12/14/18  Yes Love, Ivan Anchors, PA-C  OXYGEN Inhale 3 L into the lungs continuous. FOR COPD   Yes [provider]  Selexipag (UPTRAVI) 200 MCG TABS Take 2 tablets (400 mcg total) by mouth 2 (two) times a day. Patient taking differently: Take 600 mcg by mouth 2 (two) times a day.  02/07/19  Yes Larey Dresser, MD  sertraline (ZOLOFT) 50 MG tablet Take 75 mg by mouth daily.    Yes [provider]  sodium chloride (OCEAN) 0.65 % SOLN nasal spray Place 1 spray into both nostrils as needed for congestion. 12/14/18  Yes Love, Ivan Anchors, PA-C  TRULICITY 1.5 JF/9.0NI SOPN Inject 1.5 mg into the vein once a week.  11/08/18  Yes [provider]  citalopram (CELEXA) 10 MG tablet TAKE 1 TABLET BY MOUTH EVERY DAY Patient not taking: Reported on 02/10/2019 01/31/19   Charlett Blake, MD  diclofenac sodium (VOLTAREN) 1 % GEL Apply 2 g topically 4 (four) times daily. Patient not taking: Reported on 02/10/2019 12/14/18   Love, Ivan Anchors, PA-C  docusate sodium (COLACE) 100 MG capsule Take 1 capsule (100 mg total) by mouth 2 (two) times daily. Patient not taking: Reported on 02/10/2019 12/14/18   Love, Ivan Anchors, PA-C  ipratropium-albuterol (DUONEB) 0.5-2.5 (3) MG/3ML SOLN Take 3 mLs by nebulization every 4 (four) hours as needed (wheezing, Shortness of breath). Patient not taking: Reported on 02/10/2019 03/13/16   Murlean Iba, MD  iron polysaccharides (NIFEREX) 150 MG capsule Take 1 capsule (150 mg total) by mouth 2 (two) times daily. 12/14/18   Love, Ivan Anchors, PA-C  polyethylene glycol (MIRALAX / GLYCOLAX) 17 g packet Take 17 g by mouth daily as needed for mild constipation. Patient not taking: Reported on 02/10/2019 11/30/18   Domenic Polite, MD  traMADol (ULTRAM) 50 MG tablet Take 50 mg by mouth every 6 (six) hours as needed for moderate pain.    [provider]    Past Medical History: Past Medical History:  Diagnosis Date  . Anemia   . Anxiety   . Arthritis   . CAD (coronary artery disease)    a. Prior cath 2015 showed 40% prox AD, 50-50% mLAD, otherwise calcification but no obstruction in LCx/RCA.. Medical management recommended. b. 2017: low-risk NST.  [name] Chronic diastolic CHF (congestive heart failure) ([place])   . Chronic respiratory failure ([place])   . CKD (chronic kidney disease), stage III ([place])   . Complication of anesthesia  oxygen level dropped in last surgery 11/2018  . COPD (chronic obstructive pulmonary disease) ([place])   . Cor pulmonale ([name])    a. felt due to advanced COPD and noncompliance with O2.  . Depression   . Diabetes mellitus    [name]    dx  2008  . Fracture    left foot  . Hx of seasonal allergies   . Hypercholesteremia   . Hypertension   . Lung cancer ([place])   . On home oxygen therapy    "2.5L; 24/7" (03/10/2017)  . Pericardial effusion    a. small-moderate in 07/2016.  [name] Pneumonia   . Pulmonary hypertension ([place])   . UTI (urinary tract infection)     Past Surgical History: Past Surgical History:  Procedure Laterality Date  . ANKLE FUSION Right 02/11/2019   Procedure: RIGHT ANKLE FUSION;  Surgeon: Newt Minion, MD;  Location: [facility];  Service: Orthopedics;  Laterality: Right;  . APPLICATION OF WOUND VAC Right 11/23/2018   Procedure: Application Of Wound Vac;  Surgeon: Newt Minion, MD;  Location: [facility];  Service: Orthopedics;  Laterality: Right;  . [facility] in [place]     bilateral cataracts  . HARDWARE REMOVAL Right 02/11/2019   Procedure: REMOVAL DEEP HARDWARE RIGHT ANKLE;  Surgeon: Newt Minion, MD;  Location: [facility];  Service: Orthopedics;  Laterality: Right;  . LEFT HEART CATHETERIZATION WITH  CORONARY ANGIOGRAM N/A 07/12/2014   Procedure: LEFT HEART CATHETERIZATION WITH CORONARY ANGIOGRAM;  Surgeon: [name], MD;  Location: [facility] CATH LAB;  Service: Cardiovascular;  Laterality: N/A;  . MAXIMUM ACCESS (MAS)POSTERIOR LUMBAR INTERBODY FUSION (PLIF) 2 LEVEL N/A 02/22/2016   Procedure: Lumbar three-four - Lumbar four-five  MAXIMUM ACCESS SURGERY  POSTERIOR LUMBAR INTERBODY FUSION;  Surgeon: [name], MD;  Location: [facility];  Service: Neurosurgery;  Laterality: N/A;  . ORIF ANKLE FRACTURE Right 11/23/2018   Procedure: OPEN REDUCTION INTERNAL FIXATION (ORIF) RIGHT ANKLE FRACTURE;  Surgeon: Newt Minion, MD;  Location: [facility];  Service: Orthopedics;  Laterality: Right;  . ORIF TOE FRACTURE Left 06/12/2017   Procedure: OPEN REDUCTION INTERNAL FIXATION (ORIF) BASE 1ST METATARSAL (TOE) FRACTURE;  Surgeon: Newt Minion, MD;  Location: [facility];  Service: Orthopedics;  Laterality: Left;  . RIGHT HEART CATH N/A 08/06/2017   Procedure: RIGHT HEART CATH;  Surgeon: Larey Dresser, MD;  Location: [facility] CV LAB;  Service: Cardiovascular;  Laterality: N/A;  . THORACOTOMY Right 2010   lower  . TONSILLECTOMY      Family History: Family History  Problem Relation Age of Onset  . Diabetes Father   . Heart failure Father   . Cancer Sister   . Diabetes Brother   . Heart failure Brother     Social History: Social History   Socioeconomic History  . Marital status: Divorced    Spouse name: Not on file  . Number of children: Not on file  . Years of education: Not on file  . Highest education level: Not on file  Occupational History  . Not on file  Social Needs  . Financial resource strain: Not on file  . Food insecurity    Worry: Not on file    Inability: Not on file  . Transportation needs    Medical: Not on file    Non-medical: Not on file  Tobacco Use  . Smoking status: Former  Smoker    Packs/day: 1.00    Years: 30.00    Pack years: 30.00    Quit date: 07/14/2006     Years since quitting: 12.6  . Smokeless tobacco: Never Used  Substance and Sexual Activity  . Alcohol use: No  . Drug use: No  . Sexual activity: Not on file  Lifestyle  . Physical activity    Days per week: Not on file    Minutes per session: Not on file  . Stress: Not on file  Relationships  . Social Herbalist on phone: Not on file    Gets together: Not on file    Attends religious service: Not on file    Active member of club or organization: Not on file    Attends meetings of clubs or organizations: Not on file    Relationship status: Not on file  Other Topics Concern  . Not on file  Social History Narrative  . Not on file    Allergies:  Allergies  Allergen Reactions  . Amoxicillin Anaphylaxis, Hives, Rash and Other (See Comments)    Has patient had a PCN reaction causing immediate rash, facial/tongue/throat swelling, SOB or lightheadedness with hypotension: YES Positive reaction causing SEVERE RASH INVOLVING MUCUS MEMBRANES/SKIN NECROSIS: YES Reaction that required HOSPITALIZATION: YES Reaction occurring within the last 10 years: NO    Objective:    Vital Signs:   Temp:  [97.7 F (36.5 C)-98.4 F (36.9 C)] 97.7 F (36.5 C) (08/12 1044) Pulse Rate:  [73-83] 73 (08/12 0432) Resp:  [15-21] 15 (08/12 0432) BP: (122-143)/(47-75) 124/67 (08/12 1044) SpO2:  [92 %-100 %] 100 % (08/12 0722) FiO2 (%):  [65 %-80 %] 65 % (08/12 1044) Weight:  [78 kg] 78 kg (08/12 0432) Last BM Date: 02/21/19  Weight change: Filed Weights   02/11/19 0553 02/22/19 1100 02/23/19 0432  Weight: 77.1 kg 78.2 kg 78 kg    Intake/Output:   Intake/Output Summary (Last 24 hours) at 02/23/2019 1253 Last data filed at 02/23/2019 1200 Gross per 24 hour  Intake 710 ml  Output 2900 ml  Net -2190 ml      Physical Exam    General:  Appears chronically ill.  HEENT: normal Neck: supple. JVP to jaw . Carotids 2+ bilat; no bruits. No lymphadenopathy or thyromegaly appreciated.  Cor: PMI nondisplaced. Regular rate & rhythm. No rubs, gallops or murmurs. Lungs: clear on HFNC Abdomen: soft, nontender, nondistended. No hepatosplenomegaly. No bruits or masses. Good bowel sounds. Extremities: no cyanosis, clubbing, rash, edema. LLE orthotic boot.  Neuro: alert & orientedx3, cranial nerves grossly intact. moves all 4 extremities w/o difficulty. Affect pleasant   Telemetry   NSR 70-80s   EKG    N/a   Labs   Basic Metabolic Panel: Recent Labs  Lab 02/18/19 0240 02/19/19 0229 02/20/19 0235 02/22/19 0736 02/23/19 0231  NA 139 138 142 139 138  K 4.4 4.1 3.9 4.1 3.8  CL 93* 93* 94* 92* 95*  CO2 36* 35* 36* 35* 32  GLUCOSE 174* 143* 117* 205* 271*  BUN 69* 63* 53* 56* 61*  CREATININE 1.81* 1.66* 1.32* 1.52* 1.51*  CALCIUM 8.8* 8.8* 9.0 9.3 9.2    Liver Function Tests: No results for input(s): AST, ALT, ALKPHOS, BILITOT, PROT, ALBUMIN in the last 168 hours. No results for input(s): LIPASE, AMYLASE in the last 168 hours. No results for input(s): AMMONIA in the last 168 hours.  CBC: Recent Labs  Lab 02/17/19 0224 02/18/19 0240 02/19/19  7867 02/20/19 0235 02/22/19 0736 02/23/19 0231  WBC 8.9 9.6 8.8 8.1 8.5 10.8*  NEUTROABS 6.9 7.4 6.7 6.4 8.0*  --   HGB 7.3* 7.4* 7.3* 7.8* 8.4* 7.7*  HCT 24.5* 25.1* 24.2* 25.8* 28.3* 25.5*  MCV 83.1 84.2 82.0 82.2 83.0 82.8  PLT 219 199 186 184 224 224    Cardiac Enzymes: No results for input(s): CKTOTAL, CKMB, CKMBINDEX, TROPONINI in the last 168 hours.  BNP: BNP (last 3 results) Recent Labs    03/11/18 1526 04/09/18 1749 02/13/19 1805  BNP 246.9* 297.8* 1,211.6*    ProBNP (last 3 results) No results for input(s): PROBNP in the last 8760 hours.   CBG: Recent Labs  Lab 02/22/19 1143 02/22/19 1558 02/22/19 2122 02/23/19 0629 02/23/19 1123  GLUCAP 235* 255* 189* 216* 296*    Coagulation Studies: No results for input(s): LABPROT, INR in the last 72 hours.   Imaging    No results found.    Medications:     Current Medications: . acetaZOLAMIDE  250 mg Oral BID  . albuterol  2.5 mg Nebulization TID  . arformoterol  15 mcg Nebulization BID  . atorvastatin  40 mg Oral q1800  . azelastine  1 spray Each Nare BID  . budesonide (PULMICORT) nebulizer solution  0.5 mg Nebulization BID  . docusate sodium  100 mg Oral BID  . feeding supplement (GLUCERNA SHAKE)  237 mL Oral BID BM  . furosemide  60 mg Intravenous Q12H  . gabapentin  300 mg Oral TID  . guaiFENesin  600 mg Oral BID  . insulin aspart  0-20 Units Subcutaneous TID WC  . insulin aspart  0-5 Units Subcutaneous QHS  . insulin aspart  5 Units Subcutaneous TID WC  . insulin detemir  35 Units Subcutaneous QHS  . iron polysaccharides  150 mg Oral BID  . macitentan  10 mg Oral Daily  . mouth rinse  15 mL Mouth Rinse BID  . methylPREDNISolone (SOLU-MEDROL) injection  40 mg Intravenous Q6H  . midodrine  5 mg Oral TID WC  . potassium chloride  20 mEq Oral BID  . Selexipag  600 mcg Oral BID  . sertraline  75 mg Oral Daily  . traMADol  50 mg Oral Q6H  . umeclidinium bromide  1 puff Inhalation Daily  . vitamin C  250 mg Oral BID     Infusions: . azithromycin 500 mg (02/22/19 1123)       Assessment/Plan   1. S/P Debridement Removal Hardware R ankle fusion Per Dr Sharol Given.  2/ A/C Respiratory Failure  On HFNC weaning to maintain sats. Currently on HFNC 20 liters.  At home she was on 4 liters [place].   3. AECOPD On antibiotics, steroids, nebs.   4. A/C Cor Pulmonale ECHO 02/14/19 EF 55-60% , moderately reduced RV , D shaped septum.  Volume status improving but needs to continue IV lasix for a few days. Continue to diurese 60 mg twice a day.  In the past she was on spironolactone but stopped due to orthostatic hypotension. Consider 12.5 mg spiro tomorrow.   5. 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  Serologies negative.  Restrictive PFTs but lung parenchyma looked relatively normal on 1/18 CTA chest.  Findings by PFTs and CT not  consistent with emphysema. V/Q scan negative for chronic or acute PEs. Serologic workup was negative. RHC (1/19) showed moderate pulmonary arterial hypertension. Echo in 8/18 showed dilated and dysfunctional RV, similar in 11/18 and 9/19.  Echo in 9/19 showed  PA systolic pressure still very high at 81 mmHg.  Sleep study showed nocturnal hypoxemia but not OSA. - on home dose of selexipag + macitentin. Will continue.  -Intolerant tadalafil due to visual changes.   6. CKD Stage III Creatinine baseline 1.5-1.7 Creatinine 1.5. Follow BMET daily.   Length of Stay: [place], NP  02/23/2019, 12:53 PM  Advanced Heart Failure Team Pager [phone] (M-F; 7a - 4p)  Please contact [facility] for night-coverage after hours (4p -7a ) and weekends on [email or web address]  Patient seen with NP, agree with the above note.   She is now s/p removal of hardware from right ankle, with acute on chronic respiratory failure thought to be possible combination of diastolic CHF/RV failure in setting of severe pulmonary hypertension and AECOPD.   General: NAD Neck: JVP 10-12 cm, no thyromegaly or thyroid nodule.  Lungs: Decreased BS bilaterally. CV: Nondisplaced PMI.  Heart regular S1/S2, no S3/S4, 2/6 early SEM RUSB.  Trace ankle edema.   Abdomen: Soft, nontender, no hepatosplenomegaly, no distention.  Skin: Intact without lesions or rashes.  Neurologic: Alert and oriented x 3.  Psych: Normal affect. Extremities: No clubbing or cyanosis.  HEENT: Normal.   1. Acute on chronic hypoxemic respiratory failure: She is now on high flow oxygen.  She is on home oxygen at baseline.  Past PFTs were restrictive, but last CT chest in 6/20 showed emphysema.  She is being treated for AECOPD and is being diuresed for CHF.  - Continue Solumedrol and azithromycin per primary service.  - Continue IV diuresis.  2. 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  Serologies negative.  Restrictive PFTs but lung parenchyma looked  relatively normal on 1/18 CTA chest.  Findings by PFTs and CT not consistent with emphysema. V/Q scan negative for chronic or acute PEs. Serologic workup was negative. RHC (1/19) showed moderate pulmonary arterial hypertension. Echo in 8/18 showed dilated and dysfunctional RV, similar in 11/18 and 9/19.  Echo in 2/90 showed PA systolic pressure still very high at 81 mmHg.  Sleep study showed nocturnal hypoxemia but not OSA.  She wears home oxygen.  She is currently on Opsumit and selexipag. Echo in 8/20 showed normal LV EF, moderate RV dilation/moderate dysfunction with D-shaped septum and PASP 80 mmHg, mild AS.  - Continue current dose of selexipag. Intolerant higher doses. .  - Continue Opsumit.  - Given visual changes, have not had her restart tadalafil.    3. Acute on chronic diastolic CHF: With prominent RV failure. She is volume overloaded on exam still, creatinine fairly stable.  - Continue Lasix 60 mg IV bid, follow I/Os.   4. CKD: Stage 3. Creatinine fairly stable around 1.5.  5. CAD: Nonobstructive in 2015.  No exertional CP.   - Continue atorvastatin 40 mg daily.  6. Orthostatic hypotension: RV failure may play a role.   - Continue midodrine 5 mg TID 7. R ankle Fracture, S/P ORIF 11/26/2018.  Now this admission s/p debridement, removal hardware, R ankle fusion.  - PT - Per Dr. Sharol Given.   [name] 02/23/2019

## 2019-02-23 NOTE — Progress Notes (Signed)
Subjective: 12 Days Post-Op Procedure(s) (LRB): REMOVAL DEEP HARDWARE RIGHT ANKLE (Right) RIGHT ANKLE FUSION (Right) Reports feeling much better today.   Objective: Vital signs in last 24 hours: Temp:  [97.7 F (36.5 C)-98.4 F (36.9 C)] 97.7 F (36.5 C) (08/12 1044) Pulse Rate:  [73-83] 73 (08/12 0432) Resp:  [15-21] 15 (08/12 0432) BP: (122-143)/(47-75) 124/67 (08/12 1044) SpO2:  [92 %-100 %] 100 % (08/12 0722) FiO2 (%):  [65 %-80 %] 65 % (08/12 1044) Weight:  [78 kg] 78 kg (08/12 0432)  Intake/Output from previous day: 08/11 0701 - 08/12 0700 In: 470 [P.O.:220; IV Piggyback:250] Out: 2700 [Urine:2700] Intake/Output this shift: Total I/O In: 460 [P.O.:460] Out: -   Recent Labs    02/22/19 0736 02/23/19 0231  HGB 8.4* 7.7*   Recent Labs    02/22/19 0736 02/23/19 0231  WBC 8.5 10.8*  RBC 3.41* 3.08*  HCT 28.3* 25.5*  PLT 224 224   Recent Labs    02/22/19 0736 02/23/19 0231  NA 139 138  K 4.1 3.8  CL 92* 95*  CO2 35* 32  BUN 56* 61*  CREATININE 1.52* 1.51*  GLUCOSE 205* 271*  CALCIUM 9.3 9.2   No results for input(s): LABPT, INR in the last 72 hours.  Right ankle incision with scant heme drainage. No signs of infection or cellulitis.   Assessment/Plan: 12 Days Post-Op Procedure(s) (LRB): REMOVAL DEEP HARDWARE RIGHT ANKLE (Right) RIGHT ANKLE FUSION (Right) S/P right ankle fusion- POD#12- Continue dry dressing/daily dressing change, continue sutures. Left knee OA- less symptomatic, monitor  Respiratory failure- Management per CCM - improving with decreased O2 requirements. Continues steroid, zithromax , nebs HF/pulmonary HTN- Dr. Aundra Dubin to see.  Dispo- LTAC appeal underway.  Erlinda Hong, PA-C 02/23/2019, 12:18 PM  [facility] [phone]

## 2019-02-23 NOTE — Progress Notes (Addendum)
NAME:  Amy Shepard, MRN:  [id number], DOB:  [birth year], LOS: 71 ADMISSION DATE:  02/11/2019, CONSULTATION DATE:  02/21/2019 REFERRING MD:  Dr. Sloan Leiter, CHIEF COMPLAINT:  Hypoxia  Brief History   73 year old female with chronic hypoxic respiratory failure admitted 7/31 for elective repair of nonhealing right trimalleolar ankle fracture s/p hardware removal and fusion on 7/31 by Dr. Sharol Given, being medically managed by [facility].  Post-operatively has had increased and ongoing oxygenation requirements despite optimized medical therapy with IV steroid taper and diuresis and remains on heated high flow unable to wean.  PCCM consulted for further pulmonary recommendations.   Past Medical History  Chronic hypoxic respiratory failure on home oxygen 4L, COPD, CAD, prior non-small cell lung cancer in January 2010 s/p right lower lobe superior segmentectomy with LN dissection and chemotherapy with carboplatin and etopiside, moderate pulmonary hypertension, chronic diastolic heart failure, HTN, HLD, DM, CKD stage III, anemia of chronic disease, right charcot ankle fracture  She is followed by Dr. Aundra Dubin for HF/ pulmonary hypertension  Significant Hospital Events   7/31 Admitted / OR  8/10 PCCM consult.  Add abx, steroids, changed nebs, diuresis  8/11 Afebrile, less wheeze, HFNC 100% / 30L.  Refused BiPAP overnight. Net neg 4.8.  8/12 HFNC 25L, 65% FiO2  Consults:  Ortho TRH  Procedures:  7/31 OR for Charcot Collapse Right Ankle with Failure Fixation  Significant Diagnostic Tests:  8/3 TTE >> EF 55 to 40%, mod RV systolic dysfx with D shaped intraventricular septum, mild AS, PAS 88 mmHg  Micro Data:  8/2 SARS CoV-2 >> negative 7/31 right ankle tissue cx >> neg  Antimicrobials:  7/30 vanc OR 8/10 azithro >>  Interim history/subjective:  Pt reports feeling much improved.  O2 needs improved but remains > than baseline 4L.    Objective   Blood pressure 124/67, pulse 73, temperature 97.7 F  (36.5 C), temperature source Oral, resp. rate 15, height _0  (1.575 m), weight 78 kg, SpO2 100 %.    FiO2 (%):  [65 %-80 %] 65 %   Intake/Output Summary (Last 24 hours) at 02/23/2019 1130 Last data filed at 02/23/2019 1044 Gross per 24 hour  Intake 710 ml  Output 2200 ml  Net -1490 ml   Filed Weights   02/11/19 0553 02/22/19 1100 02/23/19 0432  Weight: 77.1 kg 78.2 kg 78 kg   Examination: General: adult female sitting up in bed eating lunch  HEENT: MM pink/moist, no jvd, heated HFNC in place Neuro: AAOx4, speech clear, MAE CV: s1s2 RRR, ? Murmur  PULM:  Non-labored on HFNC, lungs bilaterally with few faint crackles posterior lower, clear anterior GI: soft, bsx4 active  Extremities: warm/dry, no edema  Skin: no rashes or lesions   Resolved Hospital Problem list     Assessment & Plan:   Acute on chronic hypoxic, hypercapnic respiratory failure -baseline 4L O2  P: Wean O2 for sats 88-94% PRN BiPAP for increased WOB Push pulmonary hygiene -IS, mobilize    COPD Exacerbation  -quit smoking 2008, up to 1.5ppd prior to that P: Continue brovana + pumicort BID  Continue incruse, Alubterol Q6 + PRN  Azitromycin D3/5 Solumedrol 40 mg IV Q6   Acute on chronic cor pulmonale -RHC 07/2017 with moderate PAH with PVR 4.7 WU, preserved cardiac output, near normal filling pressures -on Opsumit, Selexipag P: Lasix 60 mg IV Q12  Follow I/O's  Daily weights  Continue midodrine  Patient and primary service have requested CHF service evaluation  Pulmonary hypertension. -  likely WHO group 2 and 3, uncertain whether she would fit into category of WHO group 1.  Has been managed by CHF team as an outpt for pulmonary hypertension medications P: Consider CHF evaluation  Consider autoimmune evaluation > pt has sister, brother with the same.  Sister was a smoker brother was not.  Father died of the same.  Not sure that there is any value in this at this point as she is on [facility] therapy.    Hx of NSCLC s/p RLL superior segment resection in 2010, and chemotherapy in 2010. P: No acute interventions at this time   Iron deficiency anemia P: Niferex + Vit C  Goals of Care - approached by patient.  Patient is not sure what she wants at this time.  Would like to talk with her kids about it.  Given her lung disease and pulmonary hypertension, would recommend against invasive support as intubation / MV would likely cause hemodynamic instability if not cardiac collapse.  Would likely benefit from PMT consult as well.   Labs   CBC: Recent Labs  Lab 02/17/19 0224 02/18/19 0240 02/19/19 0229 02/20/19 0235 02/22/19 0736 02/23/19 0231  WBC 8.9 9.6 8.8 8.1 8.5 10.8*  NEUTROABS 6.9 7.4 6.7 6.4 8.0*  --   HGB 7.3* 7.4* 7.3* 7.8* 8.4* 7.7*  HCT 24.5* 25.1* 24.2* 25.8* 28.3* 25.5*  MCV 83.1 84.2 82.0 82.2 83.0 82.8  PLT 219 199 186 184 224 696    Basic Metabolic Panel: Recent Labs  Lab 02/18/19 0240 02/19/19 0229 02/20/19 0235 02/22/19 0736 02/23/19 0231  NA 139 138 142 139 138  K 4.4 4.1 3.9 4.1 3.8  CL 93* 93* 94* 92* 95*  CO2 36* 35* 36* 35* 32  GLUCOSE 174* 143* 117* 205* 271*  BUN 69* 63* 53* 56* 61*  CREATININE 1.81* 1.66* 1.32* 1.52* 1.51*  CALCIUM 8.8* 8.8* 9.0 9.3 9.2   GFR: Estimated Creatinine Clearance: 32.6 mL/min (A) (by C-G formula based on SCr of 1.51 mg/dL (H)). Recent Labs  Lab 02/19/19 0229 02/20/19 0235 02/22/19 0736 02/23/19 0231  WBC 8.8 8.1 8.5 10.8*    Liver Function Tests: No results for input(s): AST, ALT, ALKPHOS, BILITOT, PROT, ALBUMIN in the last 168 hours. No results for input(s): LIPASE, AMYLASE in the last 168 hours. No results for input(s): AMMONIA in the last 168 hours.  ABG    Component Value Date/Time   PHART 7.405 02/21/2019 0940   PCO2ART 62.3 (H) 02/21/2019 0940   PO2ART 40.5 (L) 02/21/2019 0940   HCO3 38.2 (H) 02/21/2019 0940   TCO2 33 (H) 11/24/2018 0552   ACIDBASEDEF 0.2 07/18/2008 0954   O2SAT 69.8  02/21/2019 0940     Coagulation Profile: No results for input(s): INR, PROTIME in the last 168 hours.  Cardiac Enzymes: No results for input(s): CKTOTAL, CKMB, CKMBINDEX, TROPONINI in the last 168 hours.  HbA1C: Hgb A1c MFr Bld  Date/Time Value Ref Range Status  02/11/2019 06:07 AM 6.9 (H) 4.8 - 5.6 % Final    Comment:    (NOTE) Pre diabetes:          5.7%-6.4% Diabetes:              >6.4% Glycemic control for   <7.0% adults with diabetes   04/11/2018 07:34 AM 7.2 (H) 4.8 - 5.6 % Final    Comment:    (NOTE) Pre diabetes:          5.7%-6.4% Diabetes:              >  6.4% Glycemic control for   <7.0% adults with diabetes     CBG: Recent Labs  Lab 02/22/19 1143 02/22/19 1558 02/22/19 2122 02/23/19 0629 02/23/19 1123  GLUCAP 235* 255* 189* 216* 296*         Noe Gens, NP-C  Pulmonary & Critical Care Pgr: [phone] or if no answer [phone] 02/23/2019, 11:33 AM

## 2019-02-23 NOTE — TOC Progression Note (Addendum)
Transition of Care [facility]) - Progression Note    Patient Details  Name: Amy Shepard MRN: [id number] Date of Birth: 1945-10-31  Transition of Care [facility]) CM/SW Contact  Zenon Mayo, RN Phone Number: 02/23/2019, 8:57 AM  Clinical Narrative:    Patient insurance denied LTAC request and an expedited appeal was requested by Dr.  Danella Penton.    8/13  Tomi Bamberger RN, BSN - per Duffy Rhody with [facility] received the expedited appeal request yesterday and we should hear something back maybe later today.     Expected Discharge Plan: Long Term Acute Care (LTAC) Barriers to Discharge: No Barriers Identified  Expected Discharge Plan and Services Expected Discharge Plan: Long Term Acute Care (LTAC) In-house Referral: NA Discharge Planning Services: CM Consult Post Acute Care Choice: Long Term Acute Care (LTAC) Living arrangements for the past 2 months: Single Family Home                 DME Arranged: (NA)         HH Arranged: NA           Social Determinants of Health (SDOH) Interventions    Readmission Risk Interventions Readmission Risk Prevention Plan 11/29/2018  Transportation Screening Complete  PCP or Specialist Appt within 3-5 Days Complete  HRI or [place] Complete  Social Work Consult for [place] Planning/Counseling Complete  Palliative Care Screening Not Applicable  Medication Review [employer] photographer) Complete  Some recent data might be hidden

## 2019-02-24 ENCOUNTER — Inpatient Hospital Stay (HOSPITAL_COMMUNITY): Payer: Medicare Other

## 2019-02-24 ENCOUNTER — Other Ambulatory Visit (HOSPITAL_COMMUNITY): Payer: Self-pay

## 2019-02-24 DIAGNOSIS — I5031 Acute diastolic (congestive) heart failure: Secondary | ICD-10-CM

## 2019-02-24 DIAGNOSIS — J441 Chronic obstructive pulmonary disease with (acute) exacerbation: Secondary | ICD-10-CM

## 2019-02-24 DIAGNOSIS — J8410 Pulmonary fibrosis, unspecified: Secondary | ICD-10-CM

## 2019-02-24 DIAGNOSIS — D638 Anemia in other chronic diseases classified elsewhere: Secondary | ICD-10-CM

## 2019-02-24 LAB — CBC
HCT: 26.2 % — ABNORMAL LOW (ref 36.0–46.0)
Hemoglobin: 7.6 g/dL — ABNORMAL LOW (ref 12.0–15.0)
MCH: 24.7 pg — ABNORMAL LOW (ref 26.0–34.0)
MCHC: 29 g/dL — ABNORMAL LOW (ref 30.0–36.0)
MCV: 85.1 fL (ref 80.0–100.0)
Platelets: 244 10*3/uL (ref 150–400)
RBC: 3.08 MIL/uL — ABNORMAL LOW (ref 3.87–5.11)
RDW: 18.9 % — ABNORMAL HIGH (ref 11.5–15.5)
WBC: 12.3 10*3/uL — ABNORMAL HIGH (ref 4.0–10.5)
nRBC: 0.2 % (ref 0.0–0.2)

## 2019-02-24 LAB — RETICULOCYTES
Immature Retic Fract: 15.1 % (ref 2.3–15.9)
RBC.: 3.09 MIL/uL — ABNORMAL LOW (ref 3.87–5.11)
Retic Count, Absolute: 91.2 10*3/uL (ref 19.0–186.0)
Retic Ct Pct: 3 % (ref 0.4–3.1)

## 2019-02-24 LAB — IRON AND TIBC
Iron: 59 ug/dL (ref 28–170)
Saturation Ratios: 14 % (ref 10.4–31.8)
TIBC: 414 ug/dL (ref 250–450)
UIBC: 355 ug/dL

## 2019-02-24 LAB — GLUCOSE, CAPILLARY
Glucose-Capillary: 267 mg/dL — ABNORMAL HIGH (ref 70–99)
Glucose-Capillary: 292 mg/dL — ABNORMAL HIGH (ref 70–99)
Glucose-Capillary: 266 mg/dL — ABNORMAL HIGH (ref 70–99)
Glucose-Capillary: 180 mg/dL — ABNORMAL HIGH (ref 70–99)

## 2019-02-24 LAB — BASIC METABOLIC PANEL
Anion gap: 10 (ref 5–15)
BUN: 57 mg/dL — ABNORMAL HIGH (ref 8–23)
CO2: 31 mmol/L (ref 22–32)
Calcium: 9.3 mg/dL (ref 8.9–10.3)
Chloride: 99 mmol/L (ref 98–111)
Creatinine, Ser: 1.55 mg/dL — ABNORMAL HIGH (ref 0.44–1.00)
GFR calc Af Amer: 38 mL/min — ABNORMAL LOW (ref >60)
GFR calc non Af Amer: 33 mL/min — ABNORMAL LOW (ref >60)
Glucose, Bld: 243 mg/dL — ABNORMAL HIGH (ref 70–99)
Potassium: 3.6 mmol/L (ref 3.5–5.1)
Sodium: 140 mmol/L (ref 135–145)

## 2019-02-24 LAB — PROCALCITONIN: Procalcitonin: 0.11 ng/mL

## 2019-02-24 LAB — FOLATE: Folate: 10.8 ng/mL (ref >5.9)

## 2019-02-24 LAB — FERRITIN: Ferritin: 20 ng/mL (ref 11–307)

## 2019-02-24 LAB — MAGNESIUM: Magnesium: 2.3 mg/dL (ref 1.7–2.4)

## 2019-02-24 LAB — VITAMIN B12: Vitamin B-12: 566 pg/mL (ref 180–914)

## 2019-02-24 LAB — BRAIN NATRIURETIC PEPTIDE: B Natriuretic Peptide: 1344.9 pg/mL — ABNORMAL HIGH (ref 0.0–100.0)

## 2019-02-24 MED ORDER — HEPARIN SODIUM (PORCINE) 5000 UNIT/ML IJ SOLN
5000.0000 [IU] | Freq: Three times a day (TID) | INTRAMUSCULAR | Status: DC
  Administered 2019-02-24 – 2019-03-11 (×40): 5000 [IU] via SUBCUTANEOUS
  Filled 2019-02-24 (×42): qty 1

## 2019-02-24 MED ORDER — INSULIN DETEMIR 100 UNIT/ML ~~LOC~~ SOLN
25.0000 [IU] | Freq: Two times a day (BID) | SUBCUTANEOUS | Status: DC
  Administered 2019-02-24 – 2019-02-26 (×5): 25 [IU] via SUBCUTANEOUS
  Filled 2019-02-24 (×6): qty 0.25

## 2019-02-24 MED ORDER — POTASSIUM CHLORIDE CRYS ER 20 MEQ PO TBCR
30.0000 meq | EXTENDED_RELEASE_TABLET | Freq: Once | ORAL | Status: AC
  Administered 2019-02-24: 30 meq via ORAL
  Filled 2019-02-24: qty 1

## 2019-02-24 MED ORDER — FUROSEMIDE 10 MG/ML IJ SOLN
80.0000 mg | Freq: Two times a day (BID) | INTRAMUSCULAR | Status: DC
  Administered 2019-02-24 – 2019-02-26 (×4): 80 mg via INTRAVENOUS
  Filled 2019-02-24 (×5): qty 8

## 2019-02-24 MED ORDER — UPTRAVI 200 MCG PO TABS: 600.0000 ug | ORAL_TABLET | Freq: Two times a day (BID) | ORAL | 3 refills | Status: DC

## 2019-02-24 NOTE — Progress Notes (Signed)
Patient ID: Amy Shepard, female   DOB: 1945/12/04, 73 y.o.   MRN: [id number]      Advanced Heart Failure Rounding Note  PCP-Cardiologist: No primary care provider on file.   Subjective:    Weight is down with IV diuresis, creatinine fairly stable at 1.55.  She remains on high flow oxygen.   CXR with left basilar infiltrate.    Objective:   Weight Range: 76.5 kg Body mass index is 30.85 kg/m.   Vital Signs:   Temp:  [97.6 F (36.4 C)-97.8 F (36.6 C)] 97.6 F (36.4 C) (08/13 1142) Pulse Rate:  [66-87] 66 (08/13 1530) Resp:  [10-24] 13 (08/13 1530) BP: (103-136)/(52-72) 129/72 (08/13 1530) SpO2:  [90 %-100 %] 91 % (08/13 1530) FiO2 (%):  [60 %-65 %] 60 % (08/13 1351) Weight:  [76.5 kg] 76.5 kg (08/13 0214) Last BM Date: 02/23/19  Weight change: Filed Weights   02/22/19 1100 02/23/19 0432 02/24/19 0214  Weight: 78.2 kg 78 kg 76.5 kg    Intake/Output:   Intake/Output Summary (Last 24 hours) at 02/24/2019 1538 Last data filed at 02/24/2019 0742 Gross per 24 hour  Intake 120 ml  Output 1050 ml  Net -930 ml      Physical Exam    General:  Well appearing. No resp difficulty HEENT: Normal Neck: Supple. JVP 10-12 cm. Carotids 2+ bilat; no bruits. No lymphadenopathy or thyromegaly appreciated. Cor: PMI nondisplaced. Regular rate & rhythm. No rubs, gallops or murmurs. Lungs: Decreased BS at bases.  Abdomen: Soft, nontender, nondistended. No hepatosplenomegaly. No bruits or masses. Good bowel sounds. Extremities: No cyanosis, clubbing, rash, edema Neuro: Alert & orientedx3, cranial nerves grossly intact. moves all 4 extremities w/o difficulty. Affect pleasant   Telemetry   NSR 70s (personally reviewed)   Labs    CBC Recent Labs    02/22/19 0736 02/23/19 0231 02/24/19 0325  WBC 8.5 10.8* 12.3*  NEUTROABS 8.0*  --   --   HGB 8.4* 7.7* 7.6*  HCT 28.3* 25.5* 26.2*  MCV 83.0 82.8 85.1  PLT 224 224 322   Basic Metabolic Panel Recent Labs   02/23/19 0231 02/24/19 0325  NA 138 140  K 3.8 3.6  CL 95* 99  CO2 32 31  GLUCOSE 271* 243*  BUN 61* 57*  CREATININE 1.51* 1.55*  CALCIUM 9.2 9.3  MG  --  2.3   Liver Function Tests No results for input(s): AST, ALT, ALKPHOS, BILITOT, PROT, ALBUMIN in the last 72 hours. No results for input(s): LIPASE, AMYLASE in the last 72 hours. Cardiac Enzymes No results for input(s): CKTOTAL, CKMB, CKMBINDEX, TROPONINI in the last 72 hours.  BNP: BNP (last 3 results) Recent Labs    04/09/18 1749 02/13/19 1805 02/24/19 0325  BNP 297.8* 1,211.6* 1,344.9*    ProBNP (last 3 results) No results for input(s): PROBNP in the last 8760 hours.   D-Dimer No results for input(s): DDIMER in the last 72 hours. Hemoglobin A1C No results for input(s): HGBA1C in the last 72 hours. Fasting Lipid Panel No results for input(s): CHOL, HDL, LDLCALC, TRIG, CHOLHDL, LDLDIRECT in the last 72 hours. Thyroid Function Tests No results for input(s): TSH, T4TOTAL, T3FREE, THYROIDAB in the last 72 hours.  Invalid input(s): FREET3  Other results:   Imaging    Dg Chest Port 1 View  Result Date: 02/24/2019 CLINICAL DATA:  Respiratory failure EXAM: PORTABLE CHEST 1 VIEW COMPARISON:  02/21/2019 FINDINGS: Cardiac shadow remains enlarged. Aortic calcifications are again seen. The lungs are well  aerated bilaterally with mild left basilar opacities improved from the prior exam. Small effusion is noted. Mild vascular congestion is seen. IMPRESSION: Persistent but improved left basilar infiltrate with effusion. Electronically Signed   By: Inez Catalina M.D.   On: 02/24/2019 08:35      Medications:     Scheduled Medications: . albuterol  2.5 mg Nebulization TID  . arformoterol  15 mcg Nebulization BID  . atorvastatin  40 mg Oral q1800  . azelastine  1 spray Each Nare BID  . budesonide (PULMICORT) nebulizer solution  0.5 mg Nebulization BID  . docusate sodium  100 mg Oral BID  . feeding supplement  (GLUCERNA SHAKE)  237 mL Oral BID BM  . furosemide  80 mg Intravenous Q12H  . gabapentin  300 mg Oral TID  . guaiFENesin  600 mg Oral BID  . heparin  5,000 Units Subcutaneous Q8H  . insulin aspart  0-20 Units Subcutaneous TID WC  . insulin aspart  0-5 Units Subcutaneous QHS  . insulin aspart  5 Units Subcutaneous TID WC  . insulin detemir  25 Units Subcutaneous BID  . iron polysaccharides  150 mg Oral BID  . macitentan  10 mg Oral Daily  . mouth rinse  15 mL Mouth Rinse BID  . methylPREDNISolone (SOLU-MEDROL) injection  40 mg Intravenous Q6H  . midodrine  5 mg Oral TID WC  . potassium chloride  20 mEq Oral BID  . Selexipag  600 mcg Oral BID  . sertraline  75 mg Oral Daily  . traMADol  50 mg Oral Q6H  . umeclidinium bromide  1 puff Inhalation Daily  . vitamin C  250 mg Oral BID     Infusions: . azithromycin 500 mg (02/24/19 1140)     PRN Medications:  albuterol, HYDROcodone-acetaminophen, metoCLOPramide **OR** metoCLOPramide (REGLAN) injection, morphine injection, ondansetron **OR** ondansetron (ZOFRAN) IV, sodium chloride   Assessment/Plan   1. Acute on chronic hypoxemic respiratory failure: She is now on high flow oxygen.  She is on home oxygen at baseline.  Past PFTs were restrictive, but last CT chest in 6/20 showed emphysema.  She is being treated for AECOPD and is being diuresed for CHF.  - Continue Solumedrol per primary service.  She has completed azithromycin course.  - Continue IV diuresis.  2. 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 Serologies negative. Restrictive PFTs but lung parenchyma looked relatively normal on 1/18 CTA chest. Findings by PFTs and CT not consistent with emphysema. V/Q scan negative for chronic or acute PEs. Serologic workup was negative. RHC (1/19) showed moderate pulmonary arterial hypertension. Echo in 8/18 showed dilated and dysfunctional RV, similar in 11/18 and 9/19. Echo in 5/78 showed PA systolic pressure still very high at 81  mmHg. Sleep study showed nocturnal hypoxemia but not OSA. She wears home oxygen. She is currently on Opsumit and selexipag. Echo in 8/20 showed normal LV EF, moderate RV dilation/moderate dysfunction with D-shaped septum and PASP 80 mmHg, mild AS.  - Continue current dose of selexipag. Intolerant higher doses..  - Continue Opsumit.  - Given visual changes, have not had her restart tadalafil.   3. Acute on chronic diastolic CHF: With prominent RV failure.She is volume overloaded on exam still, creatinine fairly stable. Weight is down with diuresis.  - Increase Lasix to 80 mg IV bid, follow I/Os.  4. CKD: Stage 3. Creatinine fairly stable around 1.5.  5. CAD: Nonobstructive in 2015. No exertional CP.  - Continue atorvastatin 40 mg daily.  6. Orthostatic  hypotension: RV failure may play a role.  -Continuemidodrine 5 mg TID 7. R ankle Fracture, S/P ORIF 11/26/2018.  Now this admission s/p debridement, removal hardware, R ankle fusion.  - PT - Per Dr. Sharol Given.   Length of Stay: 10  [name], MD  02/24/2019, 3:38 PM  Advanced Heart Failure Team Pager [phone] (M-F; 7a - 4p)  Please contact [facility] for night-coverage after hours (4p -7a ) and weekends on [email or web address]

## 2019-02-24 NOTE — Progress Notes (Signed)
Pt pretty much sat in a recliner today. Denies pain, SOB and distress. Oxygen _0  via HFNC, tolerating well, vitals stable, will continue to monitor  Palma Holter, RN

## 2019-02-24 NOTE — Progress Notes (Signed)
Physical Therapy Treatment Patient Details Name: Amy Shepard MRN: [id number] DOB: May 09, 1946 Today's Date: 02/24/2019    History of Present Illness 73 yo 10wks s/p trimalleolar Rt ankle fx with syndesmotic disruption. 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 7/31 hardware removal with ankle fusion, wound VAC. PMHx: lung CA, CAD, spinal fusion, depression, CHF, pulmonary HTN, CKD    PT Comments    Pt admitted with above diagnosis. Pt currently with functional limitations due to balance and endurance deficits as well as weakness.  Pt was able to perform exercises with cues only. Needed rest breaks to maintain sats >90%.  Desat to 81% at times and needed incr time to return to >90% with UE and LE exercises only.  Slow progress overall.  Pt will benefit from skilled PT to increase their independence and safety with mobility to allow discharge to the venue listed below.     Follow Up Recommendations  SNF     Equipment Recommendations  None recommended by PT    Recommendations for Other Services OT consult     Precautions / Restrictions Precautions Precautions: Fall Precaution Comments: watch O2 Required Braces or Orthoses: Other Brace Other Brace: CAM boot RLE Restrictions Weight Bearing Restrictions: Yes RUE Weight Bearing: Weight bearing as tolerated LUE Weight Bearing: Weight bearing as tolerated RLE Weight Bearing: Weight bearing as tolerated(with boot on) LLE Weight Bearing: Weight bearing as tolerated Other Position/Activity Restrictions: WBAT for transfer only with CAM boot    Mobility  Bed Mobility               General bed mobility comments: Pt was in chair on arrival  Transfers                    Ambulation/Gait             General Gait Details: unable per orders   Stairs             Wheelchair Mobility    Modified Rankin (Stroke Patients Only)       Balance                                             Cognition Arousal/Alertness: Awake/alert Behavior During Therapy: WFL for tasks assessed/performed Overall Cognitive Status: Within Functional Limits for tasks assessed                                        Exercises General Exercises - Upper Extremity Shoulder Flexion: AROM;Both;10 reps;Seated(2 sets ) Shoulder Horizontal ADduction: AROM;Both;10 reps;Seated Elbow Flexion: AROM;Both;10 reps;Seated General Exercises - Lower Extremity Ankle Circles/Pumps: AROM;Both;20 reps;Seated Quad Sets: AROM;Both;10 reps;Seated Gluteal Sets: AROM;Both;10 reps;Seated Heel Slides: AROM;Both;10 reps;Seated Hip ABduction/ADduction: AROM;Both;10 reps;Seated Straight Leg Raises: AROM;Both;10 reps;Seated Other Exercises Other Exercises: AROM, both, 10 reps 2 sets overhead press  Other Exercises: AROM, both, 10 reps 2 sets foward press/shoulder retraction     General Comments General comments (skin integrity, edema, etc.): Pt on 20L HFNC oxygen machine      Pertinent Vitals/Pain Pain Assessment: No/denies pain    Home Living                      Prior Function  PT Goals (current goals can now be found in the care plan section) Acute Rehab PT Goals Patient Stated Goal: to get strong enough to get home  Progress towards PT goals: Progressing toward goals    Frequency    Min 2X/week      PT Plan Current plan remains appropriate    Co-evaluation              AM-PAC PT "6 Clicks" Mobility   Outcome Measure  Help needed turning from your back to your side while in a flat bed without using bedrails?: A Little Help needed moving from lying on your back to sitting on the side of a flat bed without using bedrails?: A Little Help needed moving to and from a bed to a chair (including a wheelchair)?: A Little Help needed standing up from a chair using your arms (e.g., wheelchair or bedside chair)?: A Little Help needed to walk  in hospital room?: Total Help needed climbing 3-5 steps with a railing? : Total 6 Click Score: 14    End of Session Equipment Utilized During Treatment: Oxygen;Other (comment)(RLE camboot) Activity Tolerance: Patient limited by fatigue Patient left: with call bell/phone within reach;in chair Nurse Communication: Mobility status;Precautions;Weight bearing status PT Visit Diagnosis: Muscle weakness (generalized) (M62.81);Other abnormalities of gait and mobility (R26.89)     Time: 9842-1031 PT Time Calculation (min) (ACUTE ONLY): 18 min  Charges:  $Therapeutic Exercise: 8-22 mins                     [place] Pager:  [phone]  Office:  [place] 02/24/2019, 1:29 PM

## 2019-02-24 NOTE — Progress Notes (Signed)
Patient is still on Heated High Flow [place].  Current settings are 20L at 60% FIO2.  Patient is trying to hold on to 88%, has dipped down to 87% while I was giving breathing treatment.  Patient sat goal is at 85% so patient is staying above goal at this time.  Patient has been refusing Bipap; does not appear in any distress at this time, but RT will continue to monitor.

## 2019-02-24 NOTE — Progress Notes (Signed)
Patient ID: Amy Shepard, female   DOB: 1946/06/08, 73 y.o.   MRN: [id number] Patient Is a 73 year old woman who has improving pulmonary function.  Patient still requires high levels of oxygen and continued diuresis.  [facility]  Patient states that her foot feels fine she has no complaints.

## 2019-02-24 NOTE — Progress Notes (Signed)
PROGRESS NOTE  Amy Shepard TDS:[id number] DOB: [birth year]   PCP: Maurice Small, MD  DOA: 02/11/2019 LOS: 37  Brief Narrative / Interim history: 73 year old with diabetes mellitus type 2, essential hypertension, diastolic CHF, chronic respiratory failure, pulmonary nodule, lung cancer, CKD stage III brought to the hospital for elective nonhealing right trimalleolar fracture.  Underwent hardware removal and fusion of the ankle but postoperatively developed hypoxia requiring high flow oxygen therefore medical team consulted.  Pulmonology was also consulted.  Subjective: No major events overnight of this morning.  Sitting on bedside chair.  Reports improvement in her breathing.  Denies chest pain, GI or GU symptoms.  Pain fairly controlled.  Objective: Vitals:   02/24/19 0829 02/24/19 0839 02/24/19 1038 02/24/19 1142  BP:   120/68 136/67  Pulse:   74 71  Resp:   10 16  Temp:   97.6 F (36.4 C) 97.6 F (36.4 C)  TempSrc:   Oral Oral  SpO2: 97% 97%  93%  Weight:      Height:        Intake/Output Summary (Last 24 hours) at 02/24/2019 1223 Last data filed at 02/24/2019 0742 Gross per 24 hour  Intake 360 ml  Output 1400 ml  Net -1040 ml   Filed Weights   02/22/19 1100 02/23/19 0432 02/24/19 0214  Weight: 78.2 kg 78 kg 76.5 kg    Examination:  GENERAL: No acute distress.  Appears well.  HEENT: MMM.  Vision and hearing grossly intact.  NECK: Supple.  No apparent JVD.  RESP:  No IWOB. Good air movement bilaterally. CVS:  RRR. Heart sounds normal.  ABD/GI/GU: Bowel sounds present. Soft. Non tender.  MSK/EXT:  Moves extremities. No apparent deformity or edema.  Right leg and postop shoe. SKIN: no apparent skin lesion or wound NEURO: Awake, alert and oriented appropriately.  No gross deficit.  PSYCH: Calm. Normal affect.   I have personally reviewed the following labs and images:  Radiology Studies: Dg Chest Port 1 View  Result Date: 02/24/2019 CLINICAL DATA:   Respiratory failure EXAM: PORTABLE CHEST 1 VIEW COMPARISON:  02/21/2019 FINDINGS: Cardiac shadow remains enlarged. Aortic calcifications are again seen. The lungs are well aerated bilaterally with mild left basilar opacities improved from the prior exam. Small effusion is noted. Mild vascular congestion is seen. IMPRESSION: Persistent but improved left basilar infiltrate with effusion. Electronically Signed   By: Inez Catalina M.D.   On: 02/24/2019 08:35    Microbiology: No results found for this or any previous visit (from the past 240 hour(s)).  Sepsis Labs: Invalid input(s): PROCALCITONIN, LACTICIDVEN  Urine analysis:    Component Value Date/Time   COLORURINE YELLOW 04/09/2018 1910   APPEARANCEUR CLEAR 04/09/2018 1910   LABSPEC 1.009 04/09/2018 1910   PHURINE 5.0 04/09/2018 1910   GLUCOSEU NEGATIVE 04/09/2018 1910   HGBUR NEGATIVE 04/09/2018 1910   BILIRUBINUR NEGATIVE 04/09/2018 1910   KETONESUR NEGATIVE 04/09/2018 1910   PROTEINUR NEGATIVE 04/09/2018 1910   UROBILINOGEN 0.2 02/10/2013 1041   NITRITE NEGATIVE 04/09/2018 1910   LEUKOCYTESUR NEGATIVE 04/09/2018 1910    Anemia Panel: Recent Labs    02/24/19 0811  VITAMINB12 566  FOLATE 10.8  FERRITIN 20  TIBC 414  IRON 59  RETICCTPCT 3.0    Thyroid Function Tests: No results for input(s): TSH, T4TOTAL, FREET4, T3FREE, THYROIDAB in the last 72 hours.  Lipid Profile: No results for input(s): CHOL, HDL, LDLCALC, TRIG, CHOLHDL, LDLDIRECT in the last 72 hours.  CBG: Recent Labs  Lab 02/23/19 1123  02/23/19 1611 02/23/19 2120 02/24/19 0615 02/24/19 1039  GLUCAP 296* 333* 253* 267* 292*    HbA1C: No results for input(s): HGBA1C in the last 72 hours.  BNP (last 3 results): No results for input(s): PROBNP in the last 8760 hours.  Cardiac Enzymes: No results for input(s): CKTOTAL, CKMB, CKMBINDEX, TROPONINI in the last 168 hours.  Coagulation Profile: No results for input(s): INR, PROTIME in the last 168 hours.   Liver Function Tests: No results for input(s): AST, ALT, ALKPHOS, BILITOT, PROT, ALBUMIN in the last 168 hours. No results for input(s): LIPASE, AMYLASE in the last 168 hours. No results for input(s): AMMONIA in the last 168 hours.  Basic Metabolic Panel: Recent Labs  Lab 02/19/19 0229 02/20/19 0235 02/22/19 0736 02/23/19 0231 02/24/19 0325  NA 138 142 139 138 140  K 4.1 3.9 4.1 3.8 3.6  CL 93* 94* 92* 95* 99  CO2 35* 36* 35* 32 31  GLUCOSE 143* 117* 205* 271* 243*  BUN 63* 53* 56* 61* 57*  CREATININE 1.66* 1.32* 1.52* 1.51* 1.55*  CALCIUM 8.8* 9.0 9.3 9.2 9.3  MG  --   --   --   --  2.3   GFR: Estimated Creatinine Clearance: 31.4 mL/min (A) (by C-G formula based on SCr of 1.55 mg/dL (H)).  CBC: Recent Labs  Lab 02/18/19 0240 02/19/19 0229 02/20/19 0235 02/22/19 0736 02/23/19 0231 02/24/19 0325  WBC 9.6 8.8 8.1 8.5 10.8* 12.3*  NEUTROABS 7.4 6.7 6.4 8.0*  --   --   HGB 7.4* 7.3* 7.8* 8.4* 7.7* 7.6*  HCT 25.1* 24.2* 25.8* 28.3* 25.5* 26.2*  MCV 84.2 82.0 82.2 83.0 82.8 85.1  PLT 199 186 184 224 224 244    Procedures:  02/11/2019-removal of deep hardware right ankle and right ankle fusion by Dr. Sharol Given.  Microbiology summarized: SARS-CoV-2 negative. MRSA PCR negative.  Assessment & Plan: Acute on chronic respiratory failure with hypoxia/hypercapnia: On 4 L at home. -Likely a combination of acute on chronic CHF and COPD. -Manage COPD and CHF as below. -Wean oxygen as able  Acute on chronic diastolic CHF/pulmonary hypertension: Echo on 8/3 with EF of 55 to 60%, DD and D-shaped interventricular septum. BNP elevated to 1400.  Marginal procalcitonin. -Appreciate cardiology assistance.  Improving with diuretics.  About 1.6 L urine output.  Weight down.  Renal function stable. -We will follow cardiology recommendations -Daily weight, intake output and renal function.  Acute on chronic COPD: Improving. -Continue steroid, antibiotics and nebulizers. -Wean oxygen as  able.  Suboptimally controlled DM-2 with hyperglycemia, renal complication and peripheral neuropathy.  CBG elevated this morning. -Increase Levemir from 35 units daily to 25 units twice daily. -Continue mealtime coverage and sliding scale. -Check A1c. -Continue home statin.  CKD-3: Stable. -Continue monitoring while on diuretics.  Anemia of chronic disease.  Anemia panel consistent with this.  H&H stable. -Need outpatient nephrology follow-up if she has not been contacted yet.   History of right ankle fracture status post ORIF in 5/20 Mechanical fall at home  Nonunion of right ankle fracture -Status post debridement removal of hardware, right ankle fusion by Dr. Sharol Given -Management per orthopedic surgery  Hypertension: Normotensive. -Continue current cardiac regimen  History of lung cancer status post RLL superior segmentectomy and adjuvant chemotherapy.  Currently on observation. -Outpatient follow-up  DVT prophylaxis: Start subcu heparin Code Status: Full code Family Communication: Patient and/or RN. Available if any question.  Disposition Plan: Remains inpatient. Consultants: Cardiology, pulmonology, orthopedic surgery   Antimicrobials: Anti-infectives (From  admission, onward)   Start     Dose/Rate Route Frequency Ordered Stop   02/21/19 1200  azithromycin (ZITHROMAX) 500 mg in sodium chloride 0.9 % 250 mL IVPB     500 mg 250 mL/hr over 60 Minutes Intravenous Every 24 hours 02/21/19 1107     02/11/19 0630  vancomycin (VANCOCIN) IVPB 1000 mg/200 mL premix     1,000 mg 200 mL/hr over 60 Minutes Intravenous To Short Stay 02/10/19 0757 02/11/19 0718   02/10/19 0800  vancomycin (VANCOCIN) IVPB 1000 mg/200 mL premix  Status:  Discontinued     1,000 mg 200 mL/hr over 60 Minutes Intravenous To Short Stay 02/10/19 0755 02/10/19 0757      Sch Meds:  Scheduled Meds: . albuterol  2.5 mg Nebulization TID  . arformoterol  15 mcg Nebulization BID  . atorvastatin  40 mg Oral q1800   . azelastine  1 spray Each Nare BID  . budesonide (PULMICORT) nebulizer solution  0.5 mg Nebulization BID  . docusate sodium  100 mg Oral BID  . feeding supplement (GLUCERNA SHAKE)  237 mL Oral BID BM  . furosemide  60 mg Intravenous Q12H  . gabapentin  300 mg Oral TID  . guaiFENesin  600 mg Oral BID  . insulin aspart  0-20 Units Subcutaneous TID WC  . insulin aspart  0-5 Units Subcutaneous QHS  . insulin aspart  5 Units Subcutaneous TID WC  . insulin detemir  25 Units Subcutaneous BID  . iron polysaccharides  150 mg Oral BID  . macitentan  10 mg Oral Daily  . mouth rinse  15 mL Mouth Rinse BID  . methylPREDNISolone (SOLU-MEDROL) injection  40 mg Intravenous Q6H  . midodrine  5 mg Oral TID WC  . potassium chloride  20 mEq Oral BID  . Selexipag  600 mcg Oral BID  . sertraline  75 mg Oral Daily  . traMADol  50 mg Oral Q6H  . umeclidinium bromide  1 puff Inhalation Daily  . vitamin C  250 mg Oral BID   Continuous Infusions: . azithromycin 500 mg (02/24/19 1140)   PRN Meds:.albuterol, HYDROcodone-acetaminophen, metoCLOPramide **OR** metoCLOPramide (REGLAN) injection, morphine injection, ondansetron **OR** ondansetron (ZOFRAN) IV, sodium chloride   35 minutes with more than 50% spent in reviewing records, counseling patient and coordinating care.  Priest Lockridge T. Mellette  If 7PM-7AM, please contact night-coverage [email or web address] Password [facility] 02/24/2019, 12:23 PM

## 2019-02-24 NOTE — Care Management Important Message (Signed)
Important Message  Patient Details  Name: Amy Shepard MRN: [id number] Date of Birth: November 08, 1945   Medicare Important Message Given:  Yes     Memory Argue 02/24/2019, 4:03 PM

## 2019-02-25 ENCOUNTER — Encounter (HOSPITAL_COMMUNITY): Payer: Medicare Other | Admitting: Cardiology

## 2019-02-25 DIAGNOSIS — I272 Pulmonary hypertension, unspecified: Secondary | ICD-10-CM

## 2019-02-25 DIAGNOSIS — J431 Panlobular emphysema: Secondary | ICD-10-CM

## 2019-02-25 DIAGNOSIS — I5030 Unspecified diastolic (congestive) heart failure: Secondary | ICD-10-CM

## 2019-02-25 DIAGNOSIS — J984 Other disorders of lung: Secondary | ICD-10-CM

## 2019-02-25 DIAGNOSIS — J95822 Acute and chronic postprocedural respiratory failure: Secondary | ICD-10-CM

## 2019-02-25 DIAGNOSIS — N183 Chronic kidney disease, stage 3 (moderate): Secondary | ICD-10-CM

## 2019-02-25 DIAGNOSIS — I130 Hypertensive heart and chronic kidney disease with heart failure and stage 1 through stage 4 chronic kidney disease, or unspecified chronic kidney disease: Secondary | ICD-10-CM

## 2019-02-25 LAB — BASIC METABOLIC PANEL
Anion gap: 11 (ref 5–15)
BUN: 60 mg/dL — ABNORMAL HIGH (ref 8–23)
CO2: 27 mmol/L (ref 22–32)
Calcium: 9.3 mg/dL (ref 8.9–10.3)
Chloride: 101 mmol/L (ref 98–111)
Creatinine, Ser: 1.38 mg/dL — ABNORMAL HIGH (ref 0.44–1.00)
GFR calc Af Amer: 44 mL/min — ABNORMAL LOW (ref >60)
GFR calc non Af Amer: 38 mL/min — ABNORMAL LOW (ref >60)
Glucose, Bld: 197 mg/dL — ABNORMAL HIGH (ref 70–99)
Potassium: 4.1 mmol/L (ref 3.5–5.1)
Sodium: 139 mmol/L (ref 135–145)

## 2019-02-25 LAB — GLUCOSE, CAPILLARY
Glucose-Capillary: 138 mg/dL — ABNORMAL HIGH (ref 70–99)
Glucose-Capillary: 176 mg/dL — ABNORMAL HIGH (ref 70–99)
Glucose-Capillary: 308 mg/dL — ABNORMAL HIGH (ref 70–99)
Glucose-Capillary: 273 mg/dL — ABNORMAL HIGH (ref 70–99)
Glucose-Capillary: 338 mg/dL — ABNORMAL HIGH (ref 70–99)

## 2019-02-25 LAB — HEMOGLOBIN A1C
Hgb A1c MFr Bld: 6.9 % — ABNORMAL HIGH (ref 4.8–5.6)
Mean Plasma Glucose: 151.33 mg/dL

## 2019-02-25 LAB — CBC
HCT: 26.3 % — ABNORMAL LOW (ref 36.0–46.0)
Hemoglobin: 7.7 g/dL — ABNORMAL LOW (ref 12.0–15.0)
MCH: 24.5 pg — ABNORMAL LOW (ref 26.0–34.0)
MCHC: 29.3 g/dL — ABNORMAL LOW (ref 30.0–36.0)
MCV: 83.8 fL (ref 80.0–100.0)
Platelets: 258 10*3/uL (ref 150–400)
RBC: 3.14 MIL/uL — ABNORMAL LOW (ref 3.87–5.11)
RDW: 19.1 % — ABNORMAL HIGH (ref 11.5–15.5)
WBC: 12.1 10*3/uL — ABNORMAL HIGH (ref 4.0–10.5)
nRBC: 0.2 % (ref 0.0–0.2)

## 2019-02-25 LAB — MAGNESIUM: Magnesium: 2.4 mg/dL (ref 1.7–2.4)

## 2019-02-25 MED ORDER — AZITHROMYCIN 500 MG PO TABS
500.0000 mg | ORAL_TABLET | Freq: Every day | ORAL | Status: DC
  Administered 2019-02-25 – 2019-02-27 (×3): 500 mg via ORAL
  Filled 2019-02-25 (×3): qty 1

## 2019-02-25 MED ORDER — METHYLPREDNISOLONE SODIUM SUCC 40 MG IJ SOLR
40.0000 mg | Freq: Three times a day (TID) | INTRAMUSCULAR | Status: DC
  Administered 2019-02-26 (×2): 40 mg via INTRAVENOUS
  Filled 2019-02-25 (×2): qty 1

## 2019-02-25 NOTE — Progress Notes (Signed)
PROGRESS NOTE    Amy Shepard  WUX:[id number] DOB: [birth year] DOA: 02/11/2019 PCP: Maurice Small, MD   Brief Narrative:  The patient is a 73 year old with diabetes mellitus type 2, essential hypertension, diastolic CHF, chronic respiratory failure, pulmonary nodule, lung cancer, CKD stage III brought to the hospital for elective nonhealing right trimalleolar fracture. Underwent hardware removal and fusion of the ankle but postoperatively developed hypoxia requiring high flow oxygen therefore medical team consulted. Pulmonology was also consulted  Assessment & Plan:   Active Problems:   Chronic obstructive pulmonary disease ([name])   Type 2 diabetes mellitus with complication, with long-term current use of insulin ([name])   HTN (hypertension)   Acute respiratory failure with hypoxia ([name])   Chronic diastolic CHF (congestive heart failure) ([name])   Pulmonary fibrosis ([name])   Acute on chronic postoperative respiratory failure ([name])   Benign hypertensive heart and kidney disease with diastolic CHF, NYHA class II and CKD stage III ([name])   Charcot ankle, right   Pain from implanted hardware   Closed right ankle fracture, with malunion, subsequent encounter   Right upper lobe pulmonary nodule  Acute on chronic respiratory failure with hypoxia/hypercapnia likely secondary to CHF with pulmonary edema along with underlying pulmonary hypertension -Usually On 4 L at home at baseline -Likely a combination of acute on chronic CHF and COPD. -Manage COPD and CHF as below. -Wean oxygen as able; Cotninues to be on 20 liters -C/w IV Diuresis per Cardiology Recc's -CXR this AM showed persistent but improved left basilar infiltrate with effusion there is mild vascular congestion seen -Continue to wean oxygen as tolerated-continuous pulse oximetry and maintain O2 saturations greater than 88%-94% per pulmonary recommendations -Currently getting azithromycin for COPD exacerbation -Pulmonary recommending  continue bronchodilators and titrating down steroids as they are not sure if they are helping at this point -Continue with pulmonary toilet and as needed BiPAP for increased work of breathing -Continue with IV morphine 0.5 to 1 mg every 2 PRN for severe pain  Acute on chronic diastolic CHF/pulmonary hypertension:  -Echo on 8/3 with EF of 55 to 60%, DD and D-shaped interventricular septum.  -BNP elevated to 1400.  Marginal procalcitonin is 0.11 so do not feel this is infectious. -Appreciate cardiology assistance.   -Improving with diuretics delineated by cardiology.   -We will function is improving with diuresis and BUN/creatinine is now 60/1.38 -We will follow cardiology recommendations -Continue with selexipag 600 mcg p.o. twice daily along with macitentan 10 mg p.o. daily -Daily weight, intake output and renal function. -Cardiology recommending continue IV Lasix and dose was increased to IV 80 every 12 -She is -10.784 L since admission -Weight is down about 4 pounds -Continue monitor volume status carefully  Acute on chronic COPD -Improving. -Continue steroid, antibiotics and nebulizers.  Will wean steroids from 40 every 6 to 40 every 8h -Currently she is on Brovana and budesonide twice daily -Pulmonary recommending continue Incruse and albuterol every 3 times daily as well as as needed every 4 hours for wheezing shortness breath -We will change azithromycin to p.o. -Wean oxygen as able.  -Currently remains on 20 L high flow nasal cannula -As above  Suboptimally controlled DM-2 with hyperglycemia, renal complication and peripheral neuropathy.   -CBG elevated this morning has been ranging anywhere from 138-308 in the setting of steroid -Continue with Levemir 25 units subcu twice daily' as well as resistant NovoLog sliding scale insulin AC and at bedtime along with 5 units subcu 3 times daily with meals -Recent  hemoglobin A1c this morning was 6.9  CKD-3: Stable And improving  -Continue monitoring while on diuretics. -BUN/creatinine is trending down -Avoid nephrotoxic medications if possible, contrast dyes as well as hypotension-continue to monitor trend renal function -Repeat CMP in the a.m.  Anemia of chronic disease.   -Anemia panel consistent with this.  H&H stable at 7.7/26.3 -Continue iron polysaccharide 150 mg p.o. twice daily -Need outpatient nephrology follow-up if she has not been contacted yet. -Continue to monitor for signs and symptoms of bleeding; currently no overt bleeding noted -Repeat CBC in the a.m.   History of right ankle fracture status post ORIF in 5/20 Mechanical fall at home  Nonunion of right ankle fracture -Status post debridement removal of hardware, right ankle fusion by Dr. Sharol Given -Management per orthopedic surgery  Hypertension -Continue current cardiac regimen  Orthostatic hypotension -Continue with Midodrine milligrams p.o. 3 times daily  Depression and Anxiety Plan continue with sertraline 75 mg p.o. daily  History of lung cancer status post RLL superior segmentectomy and adjuvant chemotherapy.  Currently on observation. -Outpatient follow-up -Continue Treatment As above  DVT prophylaxis: Heparin 5,000 sq q8h Code Status: FULL CODE  Family Communication: No family present at bedside Disposition Plan: Pending Primary Clearance; Unfortunately not eligible to go back to LTAC will need to either go home with an IV or will need to be weaned down to 4 to 6 L of oxygen to see if she can go to SNF.  Consultants:   [facility]  Cardiology  Pulmonary   Procedures:  02/11/2019-removal of deep hardware right ankle and right ankle fusion by Dr. Sharol Given.   Antimicrobials: Anti-infectives (From admission, onward)   Start     Dose/Rate Route Frequency Ordered Stop   02/21/19 1200  azithromycin (ZITHROMAX) 500 mg in sodium chloride 0.9 % 250 mL IVPB     500 mg 250 mL/hr over 60 Minutes Intravenous Every 24 hours 02/21/19 1107      02/11/19 0630  vancomycin (VANCOCIN) IVPB 1000 mg/200 mL premix     1,000 mg 200 mL/hr over 60 Minutes Intravenous To Short Stay 02/10/19 0757 02/11/19 0718   02/10/19 0800  vancomycin (VANCOCIN) IVPB 1000 mg/200 mL premix  Status:  Discontinued     1,000 mg 200 mL/hr over 60 Minutes Intravenous To Short Stay 02/10/19 0755 02/10/19 0757     Subjective: And examined and states that she has had a rough time since her ankle surgery but states that she still Feels not short of breath.  No chest pain, lightheadedness or dizziness.  Still requiring a lot of oxygen.  No other concerns or complaints at this time.  Objective: Vitals:   02/25/19 0828 02/25/19 1051 02/25/19 1519 02/25/19 1555  BP:  120/63  97/83  Pulse:  74  74  Resp:  16  17  Temp:  98.1 F (36.7 C)  98 F (36.7 C)  TempSrc:  Oral  Oral  SpO2: 92% (!) 89% 92% 92%  Weight:      Height:        Intake/Output Summary (Last 24 hours) at 02/25/2019 1900 Last data filed at 02/25/2019 1502 Gross per 24 hour  Intake 237 ml  Output 2050 ml  Net -1813 ml   Filed Weights   02/23/19 0432 02/24/19 0214 02/25/19 0659  Weight: 78 kg 76.5 kg 76.5 kg   Examination: Physical Exam:  Constitutional: WN/WD obese Caucasian female currently in NAD and appears calm and comfortable Eyes: Lids and conjunctivae normal, sclerae anicteric  ENMT:  External Ears, Nose appear normal. Grossly normal hearing. Mucous membranes are moist Neck: Appears normal, supple, no cervical masses, normal ROM, no appreciable thyromegaly; Has some JVD Respiratory: Diminished to auscultation bilaterally, no wheezing, rales, rhonchi or crackles. Normal respiratory effort and patient is not tachypenic. No accessory muscle use. Unlabored breathing but wearing 20 Liters of Supplemental HFNC Cardiovascular: RRR, no murmurs / rubs / gallops. S1 and S2 auscultated. No appreciable LE extremity edema.  Abdomen: Soft, non-tender, Distended 2/2 body habitus. No masses  palpated. No appreciable hepatosplenomegaly. Bowel sounds positive x4.  GU: Deferred. Musculoskeletal: No clubbing / cyanosis of digits/nails. No joint deformity upper and lower extremities on a limited skin evaluation. Skin: No rashes, lesions, ulcers on a limited skin eval. No induration; Warm and dry.  Neurologic: CN 2-12 grossly intact with no focal deficits. Romberg sign and cerebellar reflexes not assessed.  Psychiatric: Normal judgment and insight. Alert and oriented x 3. Normal mood and appropriate affect.   Data Reviewed: I have personally reviewed following labs and imaging studies  CBC: Recent Labs  Lab 02/19/19 0229 02/20/19 0235 02/22/19 0736 02/23/19 0231 02/24/19 0325 02/25/19 0236  WBC 8.8 8.1 8.5 10.8* 12.3* 12.1*  NEUTROABS 6.7 6.4 8.0*  --   --   --   HGB 7.3* 7.8* 8.4* 7.7* 7.6* 7.7*  HCT 24.2* 25.8* 28.3* 25.5* 26.2* 26.3*  MCV 82.0 82.2 83.0 82.8 85.1 83.8  PLT 186 184 224 224 244 701   Basic Metabolic Panel: Recent Labs  Lab 02/20/19 0235 02/22/19 0736 02/23/19 0231 02/24/19 0325 02/25/19 0236  NA 142 139 138 140 139  K 3.9 4.1 3.8 3.6 4.1  CL 94* 92* 95* 99 101  CO2 36* 35* 32 31 27  GLUCOSE 117* 205* 271* 243* 197*  BUN 53* 56* 61* 57* 60*  CREATININE 1.32* 1.52* 1.51* 1.55* 1.38*  CALCIUM 9.0 9.3 9.2 9.3 9.3  MG  --   --   --  2.3 2.4   GFR: Estimated Creatinine Clearance: 35.3 mL/min (A) (by C-G formula based on SCr of 1.38 mg/dL (H)). Liver Function Tests: No results for input(s): AST, ALT, ALKPHOS, BILITOT, PROT, ALBUMIN in the last 168 hours. No results for input(s): LIPASE, AMYLASE in the last 168 hours. No results for input(s): AMMONIA in the last 168 hours. Coagulation Profile: No results for input(s): INR, PROTIME in the last 168 hours. Cardiac Enzymes: No results for input(s): CKTOTAL, CKMB, CKMBINDEX, TROPONINI in the last 168 hours. BNP (last 3 results) No results for input(s): PROBNP in the last 8760 hours. HbA1C: Recent  Labs    02/25/19 0236  HGBA1C 6.9*   CBG: Recent Labs  Lab 02/24/19 2156 02/25/19 0648 02/25/19 0812 02/25/19 1052 02/25/19 1557  GLUCAP 180* 138* 176* 308* 273*   Lipid Profile: No results for input(s): CHOL, HDL, LDLCALC, TRIG, CHOLHDL, LDLDIRECT in the last 72 hours. Thyroid Function Tests: No results for input(s): TSH, T4TOTAL, FREET4, T3FREE, THYROIDAB in the last 72 hours. Anemia Panel: Recent Labs    02/24/19 0811  VITAMINB12 566  FOLATE 10.8  FERRITIN 20  TIBC 414  IRON 59  RETICCTPCT 3.0   Sepsis Labs: Recent Labs  Lab 02/24/19 0325  PROCALCITON 0.11    No results found for this or any previous visit (from the past 240 hour(s)).   Radiology Studies: Dg Chest Port 1 View  Result Date: 02/24/2019 CLINICAL DATA:  Respiratory failure EXAM: PORTABLE CHEST 1 VIEW COMPARISON:  02/21/2019 FINDINGS: Cardiac shadow remains enlarged. Aortic calcifications  are again seen. The lungs are well aerated bilaterally with mild left basilar opacities improved from the prior exam. Small effusion is noted. Mild vascular congestion is seen. IMPRESSION: Persistent but improved left basilar infiltrate with effusion. Electronically Signed   By: Inez Catalina M.D.   On: 02/24/2019 08:35   Scheduled Meds: . albuterol  2.5 mg Nebulization TID  . arformoterol  15 mcg Nebulization BID  . atorvastatin  40 mg Oral q1800  . azelastine  1 spray Each Nare BID  . budesonide (PULMICORT) nebulizer solution  0.5 mg Nebulization BID  . docusate sodium  100 mg Oral BID  . feeding supplement (GLUCERNA SHAKE)  237 mL Oral BID BM  . furosemide  80 mg Intravenous Q12H  . gabapentin  300 mg Oral TID  . guaiFENesin  600 mg Oral BID  . heparin  5,000 Units Subcutaneous Q8H  . insulin aspart  0-20 Units Subcutaneous TID WC  . insulin aspart  0-5 Units Subcutaneous QHS  . insulin aspart  5 Units Subcutaneous TID WC  . insulin detemir  25 Units Subcutaneous BID  . iron polysaccharides  150 mg Oral  BID  . macitentan  10 mg Oral Daily  . mouth rinse  15 mL Mouth Rinse BID  . methylPREDNISolone (SOLU-MEDROL) injection  40 mg Intravenous Q6H  . midodrine  5 mg Oral TID WC  . potassium chloride  20 mEq Oral BID  . Selexipag  600 mcg Oral BID  . sertraline  75 mg Oral Daily  . traMADol  50 mg Oral Q6H  . umeclidinium bromide  1 puff Inhalation Daily  . vitamin C  250 mg Oral BID   Continuous Infusions: . azithromycin 500 mg (02/25/19 1148)    LOS: 14 days   Kerney Elbe, DO [facility] PAGER is on [place]  If 7PM-7AM, please contact night-coverage [email or web address] Password [email or web address] 02/25/2019, 7:00 PM

## 2019-02-25 NOTE — Progress Notes (Addendum)
NAME:  Amy Shepard, MRN:  [id number], DOB:  1946/04/28, LOS: 73 ADMISSION DATE:  02/11/2019, CONSULTATION DATE:  02/21/2019 REFERRING MD:  Dr. Sloan Leiter, CHIEF COMPLAINT:  Hypoxia  Brief History   73 year old female with chronic hypoxic respiratory failure admitted 7/31 for elective repair of nonhealing right trimalleolar ankle fracture s/p hardware removal and fusion on 7/31 by Dr. Sharol Given, being medically managed by [facility].  Post-operatively has had increased and ongoing oxygenation requirements despite optimized medical therapy with IV steroid taper and diuresis and remains on heated high flow unable to wean.  PCCM consulted for further pulmonary recommendations.   Past Medical History  Chronic hypoxic respiratory failure on home oxygen 4L, COPD, CAD, prior non-small cell lung cancer in January 2010 s/p right lower lobe superior segmentectomy with LN dissection and chemotherapy with carboplatin and etopiside, moderate pulmonary hypertension, chronic diastolic heart failure, HTN, HLD, DM, CKD stage III, anemia of chronic disease, right charcot ankle fracture  She is followed by Dr. Aundra Dubin for HF/ pulmonary hypertension  Significant Hospital Events   7/31 Admitted / OR  8/10 PCCM consult.  Add abx, steroids, changed nebs, diuresis  8/11 Afebrile, less wheeze, HFNC 100% / 30L.  Refused BiPAP overnight. Net neg 4.8.  8/12 HFNC 25L, 65% FiO2 8/14 HFNC 20L, 55% FIO2, increased to 60%   Consults:  [facility]   Procedures:  7/31 OR for Charcot Collapse Right Ankle with Failure Fixation  Significant Diagnostic Tests:  8/3 TTE >> EF 55 to 56%, mod RV systolic dysfx with D shaped intraventricular septum, mild AS, PAS 88 mmHg  Micro Data:  8/2 SARS CoV-2 >> negative 7/31 right ankle tissue cx >> neg  Antimicrobials:  7/30 vanc OR 8/10 azithro >>  Interim history/subjective:  Pt reports she continues to feel better though more SOB with exertion but this is improving too. She  is able to participate in PT. Remains on HFNC. Diuresing well. Negative 9L.  Weight 77.1kg on admission, down to 76.5kg today.   Objective   Blood pressure 127/62, pulse 67, temperature 97.7 F (36.5 C), temperature source Oral, resp. rate 13, height _0  (1.575 m), weight 76.5 kg, SpO2 92 %.    FiO2 (%):  [55 %-60 %] 55 %   Intake/Output Summary (Last 24 hours) at 02/25/2019 0848 Last data filed at 02/25/2019 0349 Gross per 24 hour  Intake 477 ml  Output 1450 ml  Net -973 ml   Filed Weights   02/23/19 0432 02/24/19 0214 02/25/19 0659  Weight: 78 kg 76.5 kg 76.5 kg   Examination: General: Elderly, obese adult female sitting up in bed watching TV. Speaking if full sentences, mouth breathing  HEENT: Normocephalic. PERRL Neuro: A&Ox4 No focal deficits CV: RRR S1 S2. No MRG  Lungs:  Post BBS end expiratory wheezing and fine crackles at bases, anterior clear. FNL, symmetrical. No accessory muscle usage.  ABD: + BS x4. SNT/ND.  Extremities: warm/dry, no edema. RLE ace wrap in place.  Skin: PWD, in tact.   Resolved Hospital Problem list     Assessment & Plan:   Acute on chronic hypoxic, hypercapnic respiratory failure -baseline 4L O2  P: Continue to wean O2 for sats 88-94% PRN BiPAP for increased WOB Continue pulmonary hygiene, IS, mobilize/PT    COPD Exacerbation with h/o RLD and Lung Ca s/p resection  -quit smoking 2008, up to 1.5ppd prior to that P: Continue brovana + pumicort BID  Continue Incruse, Alubterol Q6 + PRN  Azithromycin per  primary-would change to PO if continuing same d/t 500cc volume of IV fluid she is receiving.  Would consider weaning Solumedrol 40 mg IV Q6 as wheezing is more likely cardiac related at this point     Acute on chronic cor pulmonale -RHC 07/2017 with moderate PAH with PVR 4.7 WU, preserved cardiac output, near normal filling pressures.  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  Has been managed by CHF team as an outpt for pulmonary hypertension medications P: Per HF team  Consider autoimmune evaluation > pt has sister, brother with the same.  Sister was a smoker brother was not.  Father died of the same.  Not sure that there is any value in this at this point as she is on [facility] therapy.   Hx of NSCLC s/p RLL superior segment resection in 2010, and chemotherapy in 2010. P: No acute interventions at this time   Iron deficiency anemia P: Continue Niferex + Vit C per primary   Goals of Care and disposition: Revisited this with pt. She would like to talk with her kids about it.  I reiterated per previous d/w our team that given her lung disease and pulmonary hypertension, would recommend against invasive support as intubation/MV would likely cause hemodynamic instability if not cardiac collapse.  Recommend palliative care consult. Also recommend some form of inpatient rehab and/or LTACH as her limited mobility requiring walker and PT in setting of PH/COPD/chronic resp failure and exertion may exacerbate these conditions and she will need close monitoring for same to progress to her baseline which she is motivated to do.     Labs and imaging from past 48 hours reviewed in EMR. CXR shows improved L basilar infiltrates and small effusion, improved vascular congestion, calcified aorta.     PCCM will follow up Monday, 02/28/19. Please contact team over weekend if needed.   Francine Graven, MSN, AGACNP  Pager [phone] or if no answer [phone] [facility] Pulmonary & Critical Care

## 2019-02-25 NOTE — Plan of Care (Signed)
  Problem: Clinical Measurements: Goal: Will remain free from infection Outcome: Progressing No signs and symptoms of infection noted during shift. Goal: Respiratory complications will improve Outcome: Progressing No respiratory distress or events during shift Goal: Cardiovascular complication will be avoided Outcome: Progressing No change at baseline rhythm or blood pressure readings   Problem: Pain Managment: Goal: General experience of comfort will improve Outcome: Progressing      Receiving scheduled Tramadol with good effects

## 2019-02-25 NOTE — Progress Notes (Signed)
Subjective: 14 Days Post-Op Procedure(s) (LRB): REMOVAL DEEP HARDWARE RIGHT ANKLE (Right) RIGHT ANKLE FUSION (Right) Patient reports pain as mild.   Doing much better and Fio2 taper underway. Currently 55% with O2 sat 97% on monitor.  Appears comfortable.  Objective: Vital signs in last 24 hours: Temp:  [97.6 F (36.4 C)-98 F (36.7 C)] 97.7 F (36.5 C) (08/14 0804) Pulse Rate:  [65-74] 67 (08/14 0804) Resp:  [10-18] 13 (08/14 0804) BP: (120-138)/(62-72) 127/62 (08/14 0804) SpO2:  [88 %-94 %] 92 % (08/14 0828) FiO2 (%):  [55 %-60 %] 55 % (08/14 0828) Weight:  [76.5 kg] 76.5 kg (08/14 0659)  Intake/Output from previous day: 08/13 0701 - 08/14 0700 In: 597 [P.O.:597] Out: 1950 [Urine:1950] Intake/Output this shift: No intake/output data recorded.  Recent Labs    02/23/19 0231 02/24/19 0325 02/25/19 0236  HGB 7.7* 7.6* 7.7*   Recent Labs    02/24/19 0325 02/24/19 0811 02/25/19 0236  WBC 12.3*  --  12.1*  RBC 3.08* 3.09* 3.14*  HCT 26.2*  --  26.3*  PLT 244  --  258   Recent Labs    02/24/19 0325 02/25/19 0236  NA 140 139  K 3.6 4.1  CL 99 101  CO2 31 27  BUN 57* 60*  CREATININE 1.55* 1.38*  GLUCOSE 243* 197*  CALCIUM 9.3 9.3   No results for input(s): LABPT, INR in the last 72 hours.  Right ankle dressing with scant serosanguineous drainage over the anterior incision line. No signs of infection or cellulitis. Dressings reapplied.   Assessment/Plan: 14 Days Post-Op Procedure(s) (LRB): REMOVAL DEEP HARDWARE RIGHT ANKLE (Right) RIGHT ANKLE FUSION (Right) S/p Right ankle fusion for non union- POD#14- continue therapy, dressing changes and boot when OOB.  Chronic respiratory failure and HF- Continue management per pulmonary and HF team. Appreciate care. Dispo- LTAC appeal pending.   Erlinda Hong, PA-C 02/25/2019, 9:08 AM  [facility] [phone]

## 2019-02-25 NOTE — Progress Notes (Addendum)
Patient ID: Amy Shepard, female   DOB: 03-30-1946, 73 y.o.   MRN: [id number]      Advanced Heart Failure Rounding Note  PCP-Cardiologist: No primary care provider on file.   Subjective:    Yesterday diuresed with 80 mg IV lasix. Negative 1.3 liters. Weight unchanged.   Remains on HFNC 20 liters.    Feeling a little better.    Objective:   Weight Range: 76.5 kg Body mass index is 30.85 kg/m.   Vital Signs:   Temp:  [97.6 F (36.4 C)-98 F (36.7 C)] 97.7 F (36.5 C) (08/14 0804) Pulse Rate:  [65-74] 67 (08/14 0804) Resp:  [10-18] 13 (08/14 0804) BP: (120-138)/(62-72) 127/62 (08/14 0804) SpO2:  [88 %-94 %] 92 % (08/14 0828) FiO2 (%):  [55 %-60 %] 55 % (08/14 0828) Weight:  [76.5 kg] 76.5 kg (08/14 0659) Last BM Date: 02/23/19  Weight change: Filed Weights   02/23/19 0432 02/24/19 0214 02/25/19 0659  Weight: 78 kg 76.5 kg 76.5 kg    Intake/Output:   Intake/Output Summary (Last 24 hours) at 02/25/2019 0916 Last data filed at 02/25/2019 0349 Gross per 24 hour  Intake 477 ml  Output 1450 ml  Net -973 ml      Physical Exam    General:  No resp difficulty HEENT: normal Neck: supple. 8-9 . Carotids 2+ bilat; no bruits. No lymphadenopathy or thryomegaly appreciated. Cor: PMI nondisplaced. Regular rate & rhythm. No rubs, gallops or murmurs. Lungs: clear on HFNC Abdomen: soft, nontender, nondistended. No hepatosplenomegaly. No bruits or masses. Good bowel sounds. Extremities: no cyanosis, clubbing, rash, edema Neuro: alert & orientedx3, cranial nerves grossly intact. moves all 4 extremities w/o difficulty. Affect pleasant    Telemetry   NSR 70-80s    Labs    CBC Recent Labs    02/24/19 0325 02/25/19 0236  WBC 12.3* 12.1*  HGB 7.6* 7.7*  HCT 26.2* 26.3*  MCV 85.1 83.8  PLT 244 786   Basic Metabolic Panel Recent Labs    02/24/19 0325 02/25/19 0236  NA 140 139  K 3.6 4.1  CL 99 101  CO2 31 27  GLUCOSE 243* 197*  BUN 57* 60*   CREATININE 1.55* 1.38*  CALCIUM 9.3 9.3  MG 2.3 2.4   Liver Function Tests No results for input(s): AST, ALT, ALKPHOS, BILITOT, PROT, ALBUMIN in the last 72 hours. No results for input(s): LIPASE, AMYLASE in the last 72 hours. Cardiac Enzymes No results for input(s): CKTOTAL, CKMB, CKMBINDEX, TROPONINI in the last 72 hours.  BNP: BNP (last 3 results) Recent Labs    04/09/18 1749 02/13/19 1805 02/24/19 0325  BNP 297.8* 1,211.6* 1,344.9*    ProBNP (last 3 results) No results for input(s): PROBNP in the last 8760 hours.   D-Dimer No results for input(s): DDIMER in the last 72 hours. Hemoglobin A1C Recent Labs    02/25/19 0236  HGBA1C 6.9*   Fasting Lipid Panel No results for input(s): CHOL, HDL, LDLCALC, TRIG, CHOLHDL, LDLDIRECT in the last 72 hours. Thyroid Function Tests No results for input(s): TSH, T4TOTAL, T3FREE, THYROIDAB in the last 72 hours.  Invalid input(s): FREET3  Other results:   Imaging    No results found.   Medications:     Scheduled Medications: . albuterol  2.5 mg Nebulization TID  . arformoterol  15 mcg Nebulization BID  . atorvastatin  40 mg Oral q1800  . azelastine  1 spray Each Nare BID  . budesonide (PULMICORT) nebulizer solution  0.5 mg Nebulization  BID  . docusate sodium  100 mg Oral BID  . feeding supplement (GLUCERNA SHAKE)  237 mL Oral BID BM  . furosemide  80 mg Intravenous Q12H  . gabapentin  300 mg Oral TID  . guaiFENesin  600 mg Oral BID  . heparin  5,000 Units Subcutaneous Q8H  . insulin aspart  0-20 Units Subcutaneous TID WC  . insulin aspart  0-5 Units Subcutaneous QHS  . insulin aspart  5 Units Subcutaneous TID WC  . insulin detemir  25 Units Subcutaneous BID  . iron polysaccharides  150 mg Oral BID  . macitentan  10 mg Oral Daily  . mouth rinse  15 mL Mouth Rinse BID  . methylPREDNISolone (SOLU-MEDROL) injection  40 mg Intravenous Q6H  . midodrine  5 mg Oral TID WC  . potassium chloride  20 mEq Oral BID  .  Selexipag  600 mcg Oral BID  . sertraline  75 mg Oral Daily  . traMADol  50 mg Oral Q6H  . umeclidinium bromide  1 puff Inhalation Daily  . vitamin C  250 mg Oral BID    Infusions: . azithromycin 500 mg (02/24/19 1140)    PRN Medications: albuterol, HYDROcodone-acetaminophen, metoCLOPramide **OR** metoCLOPramide (REGLAN) injection, morphine injection, ondansetron **OR** ondansetron (ZOFRAN) IV, sodium chloride   Assessment/Plan   1. Acute on chronic hypoxemic respiratory failure: She is now on high flow oxygen.  She is on home oxygen at baseline.  Past PFTs were restrictive, but last CT chest in 6/20 showed emphysema.  She is being treated for AECOPD and is being diuresed for CHF.  - Continue Solumedrol per primary service.  She has completed azithromycin course.  - Volume status improving. Remains on HFNC 20 liters.  2. 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 Serologies negative. Restrictive PFTs but lung parenchyma looked relatively normal on 1/18 CTA chest. Findings by PFTs and CT not consistent with emphysema. V/Q scan negative for chronic or acute PEs. Serologic workup was negative. RHC (1/19) showed moderate pulmonary arterial hypertension. Echo in 8/18 showed dilated and dysfunctional RV, similar in 11/18 and 9/19. Echo in 8/76 showed PA systolic pressure still very high at 81 mmHg. Sleep study showed nocturnal hypoxemia but not OSA. She wears home oxygen. She is currently on Opsumit and selexipag. Echo in 8/20 showed normal LV EF, moderate RV dilation/moderate dysfunction with D-shaped septum and PASP 80 mmHg, mild AS.  - Continue current dose of selexipag. Intolerant higher doses..  - Continue Opsumit.  - Given visual changes, have not had her restart tadalafil.   3. Acute on chronic diastolic CHF: With prominent RV failure. Volume status improving. Continue IV lasix.   4. CKD: Stage 3. Creatinine stable 1.4.   5. CAD: Nonobstructive in 2015. No exertional CP.  -  Continue atorvastatin 40 mg daily.  6. Orthostatic hypotension: RV failure may play a role.  -Continuemidodrine 5 mg TID 7. R ankle Fracture, S/P ORIF 11/26/2018.  Now this admission s/p debridement, removal hardware, R ankle fusion.  - PT. Per Dr. Sharol Given.   Length of Stay: [place], NP  02/25/2019, 9:16 AM  Advanced Heart Failure Team Pager [phone] (M-F; [place])  Please contact [facility] for night-coverage after hours (4p -7a ) and weekends on [email or web address]  Patient seen with NP, agree with the above note.    She diuresed reasonably well again yesterday, BUN/creatinine remain stable.   She is still on 20 L/min high flow nasal cannula.  She remains on  Solumedrol.   General: NAD Neck: JVP 9-10 cm, no thyromegaly or thyroid nodule.  Lungs: Distant BS CV: Nondisplaced PMI.  Heart regular S1/S2, no S3/S4, no murmur.  No peripheral edema.   Abdomen: Soft, nontender, no hepatosplenomegaly, no distention.  Skin: Intact without lesions or rashes.  Neurologic: Alert and oriented x 3.  Psych: Normal affect. Extremities: No clubbing or cyanosis.  HEENT: Normal.   Volume status improving though still mildly volume overloaded on exam.   - Continue Lasix 80 mg IV bid today, reassess tomorrow, ?back to po.   Still requiring high flow oxygen.  CXR with left basilar infiltrate.  Has completed abx and is still getting Solumedrol for AECOPD per primary service.   Continue her home [place] meds.   [name] 02/25/2019 11:56 AM

## 2019-02-25 NOTE — Progress Notes (Addendum)
Per Jace with [facility] the denial was upheld by [facility]. The option that we have for patient is to go home with NIV or we will have to wean patient down to 4 to 6 liters of oxygen to see if can go to a SNF.

## 2019-02-26 ENCOUNTER — Inpatient Hospital Stay (HOSPITAL_COMMUNITY): Payer: Medicare Other

## 2019-02-26 DIAGNOSIS — Z515 Encounter for palliative care: Secondary | ICD-10-CM

## 2019-02-26 DIAGNOSIS — Z7189 Other specified counseling: Secondary | ICD-10-CM

## 2019-02-26 LAB — GLUCOSE, CAPILLARY
Glucose-Capillary: 116 mg/dL — ABNORMAL HIGH (ref 70–99)
Glucose-Capillary: 287 mg/dL — ABNORMAL HIGH (ref 70–99)
Glucose-Capillary: 438 mg/dL — ABNORMAL HIGH (ref 70–99)
Glucose-Capillary: 402 mg/dL — ABNORMAL HIGH (ref 70–99)
Glucose-Capillary: 133 mg/dL — ABNORMAL HIGH (ref 70–99)

## 2019-02-26 LAB — COMPREHENSIVE METABOLIC PANEL
ALT: 32 U/L (ref 0–44)
AST: 27 U/L (ref 15–41)
Albumin: 3.4 g/dL — ABNORMAL LOW (ref 3.5–5.0)
Alkaline Phosphatase: 142 U/L — ABNORMAL HIGH (ref 38–126)
Anion gap: 12 (ref 5–15)
BUN: 61 mg/dL — ABNORMAL HIGH (ref 8–23)
CO2: 28 mmol/L (ref 22–32)
Calcium: 9.5 mg/dL (ref 8.9–10.3)
Chloride: 99 mmol/L (ref 98–111)
Creatinine, Ser: 1.18 mg/dL — ABNORMAL HIGH (ref 0.44–1.00)
GFR calc Af Amer: 53 mL/min — ABNORMAL LOW (ref >60)
GFR calc non Af Amer: 46 mL/min — ABNORMAL LOW (ref >60)
Glucose, Bld: 195 mg/dL — ABNORMAL HIGH (ref 70–99)
Potassium: 3.8 mmol/L (ref 3.5–5.1)
Sodium: 139 mmol/L (ref 135–145)
Total Bilirubin: 0.4 mg/dL (ref 0.3–1.2)
Total Protein: 6.8 g/dL (ref 6.5–8.1)

## 2019-02-26 LAB — CBC WITH DIFFERENTIAL/PLATELET
Abs Immature Granulocytes: 0.45 10*3/uL — ABNORMAL HIGH (ref 0.00–0.07)
Basophils Absolute: 0 10*3/uL (ref 0.0–0.1)
Basophils Relative: 0 %
Eosinophils Absolute: 0 10*3/uL (ref 0.0–0.5)
Eosinophils Relative: 0 %
HCT: 29.9 % — ABNORMAL LOW (ref 36.0–46.0)
Hemoglobin: 8.8 g/dL — ABNORMAL LOW (ref 12.0–15.0)
Immature Granulocytes: 3 %
Lymphocytes Relative: 3 %
Lymphs Abs: 0.4 10*3/uL — ABNORMAL LOW (ref 0.7–4.0)
MCH: 24.6 pg — ABNORMAL LOW (ref 26.0–34.0)
MCHC: 29.4 g/dL — ABNORMAL LOW (ref 30.0–36.0)
MCV: 83.5 fL (ref 80.0–100.0)
Monocytes Absolute: 1.2 10*3/uL — ABNORMAL HIGH (ref 0.1–1.0)
Monocytes Relative: 9 %
Neutro Abs: 11.4 10*3/uL — ABNORMAL HIGH (ref 1.7–7.7)
Neutrophils Relative %: 85 %
Platelets: 296 10*3/uL (ref 150–400)
RBC: 3.58 MIL/uL — ABNORMAL LOW (ref 3.87–5.11)
RDW: 19 % — ABNORMAL HIGH (ref 11.5–15.5)
WBC: 13.4 10*3/uL — ABNORMAL HIGH (ref 4.0–10.5)
nRBC: 0.1 % (ref 0.0–0.2)

## 2019-02-26 LAB — MAGNESIUM: Magnesium: 2.4 mg/dL (ref 1.7–2.4)

## 2019-02-26 LAB — PHOSPHORUS: Phosphorus: 3.9 mg/dL (ref 2.5–4.6)

## 2019-02-26 MED ORDER — INSULIN ASPART 100 UNIT/ML ~~LOC~~ SOLN
5.0000 [IU] | Freq: Once | SUBCUTANEOUS | Status: AC
  Administered 2019-02-26: 5 [IU] via SUBCUTANEOUS

## 2019-02-26 MED ORDER — FUROSEMIDE 40 MG PO TABS
60.0000 mg | ORAL_TABLET | Freq: Two times a day (BID) | ORAL | Status: DC
  Administered 2019-02-26 – 2019-02-28 (×4): 60 mg via ORAL
  Filled 2019-02-26 (×4): qty 1

## 2019-02-26 MED ORDER — METHYLPREDNISOLONE SODIUM SUCC 40 MG IJ SOLR
40.0000 mg | Freq: Two times a day (BID) | INTRAMUSCULAR | Status: DC
  Administered 2019-02-27 – 2019-02-28 (×4): 40 mg via INTRAVENOUS
  Filled 2019-02-26 (×4): qty 1

## 2019-02-26 MED ORDER — INSULIN DETEMIR 100 UNIT/ML ~~LOC~~ SOLN
28.0000 [IU] | Freq: Two times a day (BID) | SUBCUTANEOUS | Status: DC
  Administered 2019-02-26 – 2019-03-04 (×13): 28 [IU] via SUBCUTANEOUS
  Filled 2019-02-26 (×15): qty 0.28

## 2019-02-26 MED ORDER — INSULIN ASPART 100 UNIT/ML ~~LOC~~ SOLN
8.0000 [IU] | Freq: Three times a day (TID) | SUBCUTANEOUS | Status: DC
  Administered 2019-02-26 – 2019-03-10 (×28): 8 [IU] via SUBCUTANEOUS

## 2019-02-26 NOTE — Consult Note (Signed)
Consultation Note Date: 02/26/2019   Patient Name: Amy Shepard  DOB: January 14, 1946  MRN: [id number]  Age / Sex: 73 y.o., female   PCP: Maurice Small, MD Referring Physician: Newt Minion, MD   REASON FOR CONSULTATION:Establishing goals of care  Palliative Care consult requested for goals of care in this 73 y.o. female with multiple medical problems including chronic diastolic congestive heart failure, pulmonary hypertension, lung cancer, chronic kidney disease stage III, diabetes,COPD (home oxygen 3-4L/), hypertension,  and a recent complicated right ankle fracture. She presented for removal of right ankle hardware and fusion. She recently underwent ORIF of right ankle which developed progressive Charcot collapse. She has been wearing a fracture boot with retained hardware. Patient developed postoperative respiratory failure requiring BiPAP and HFNC.   Clinical Assessment and Goals of Care: I have reviewed medical records including lab results, imaging, Epic notes, and MAR, received report from the bedside RN, and assessed the patient. I met at the bedside with patient to discuss diagnosis prognosis, GOC, EOL wishes, disposition and options. Amy Shepard is sitting up in chair. She is awake, alert, and oriented x3. Denies pain or shortness of breath at this time.   I introduced Palliative Medicine as specialized medical care for people living with serious illness. It focuses on providing relief from the symptoms and stress of a serious illness. The goal is to improve quality of life for both the patient and the family.  We discussed a brief life review of the patient, along with her functional and nutritional status. Patient shares that her husband died over 2 years ago from lung cancer. She has 2 children and 3 grandchildren. She is a retired Therapist, sports. She moved to [place] 13 years ago from her home ([place]) to be closer to her children and help with her grandchildren.   Prior to admission she was  living alone. She reports using a walker in addition to her fracture boot when ambulating. She was able to perform all ADLs independently.   We discussed Her current illness and what it means in the larger context of Her on-going co-morbidities. With specific discussions regarding her COPD, respiratory failure, CHF, and overall functional state. Natural disease trajectory and expectations at EOL were discussed.  Patient shares her awareness and continued health challenges over the past year. She is tearful in sharing her experiences, with discussion specifically on this admission and her having to be placed on BiPAP. She states she would never want to be placed back on BiPAP because of her extreme claustrophobia and how it makes her feel. Support given. We discuss her current requirement for high flow oxygen. Patient remains hopeful she will be able to wean down to nasal cannula.   I attempted to elicit values and goals of care important to the patient.    Amy Shepard states she does not have a documented advanced directive. We discussed her full code status with consideration to her current condition and co-morbidities. I used this opportunity to discuss patient's previous statement regarding never wishing to be placed back on BiPAP and current use of HFNC. Patient verbalized understanding, however she states she is not ready to make a decision regarding DNR/DNI. She does share that she has been in conversation with her children since her recent hospitalization and her children feel she should be a DNR. 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 I know that I should give the ok but the non-medical part of me,  the mother in me, the grandmother in me is not quite ready to accept at some point I will not make it and give authorization for nothing to be done in that case. It would be my luck the next day a cure or major health break through will happen and I could have received some form of care  that gave me more time with my family!"   Patient reports her goal for right now is to continue thinking on her wishes and discussing with her family, hopefully gain approval from insurance to be placed at a facility for rehab, and get home to be with her family again. She verbalizes awareness that weaning her off of HFNC and back to her normal home oxygen use is the goal in order to discharge to SNF in addition to insurance authorization.   Hospice and Palliative Care services outpatient were explained and offered. Patient verbalized her understanding and awareness of both palliative and hospice's goals and philosophy of care. She reports prior to admission she was receiving home nursing follow-ups with a focus on palliative support. She declines additional outpatient palliative support and express understanding of ability to initiate services later by discussing with her outpatient medical team.   Questions and concerns were addressed. Patient was encouraged to call with questions or concerns.  PMT will continue to support holistically.   SOCIAL HISTORY:     reports that she quit smoking about 12 years ago. She has a 30.00 pack-year smoking history. She has never used smokeless tobacco. She reports that she does not drink alcohol or use drugs.  CODE STATUS: Full code  ADVANCE DIRECTIVES: Patient states, both of her children would be her medical decision makers in the event she was unable to make decisions.    SYMPTOM MANAGEMENT: per attending   Palliative Prophylaxis:   Aspiration, Bowel Regimen and Frequent Pain Assessment  PSYCHO-SOCIAL/SPIRITUAL:  Support System: Family   Desire for further Chaplaincy support: NO   Additional Recommendations (Limitations, Scope, Preferences):  Full Scope Treatment   PAST MEDICAL HISTORY: Past Medical History:  Diagnosis Date   Anemia    Anxiety    Arthritis    CAD (coronary artery disease)    a. Prior cath 2015 showed 40% prox AD,  50-50% mLAD, otherwise calcification but no obstruction in LCx/RCA.. Medical management recommended. b. 2017: low-risk NST.   Chronic diastolic CHF (congestive heart failure) ([name])    Chronic respiratory failure ([name])    CKD (chronic kidney disease), stage III ([name])    Complication of anesthesia    oxygen level dropped in last surgery 11/2018   COPD (chronic obstructive pulmonary disease) ([name])    Cor pulmonale ([name])    a. felt due to advanced COPD and noncompliance with O2.   Depression    Diabetes mellitus    januvia    dx  2008   Fracture    left foot   Hx of seasonal allergies    Hypercholesteremia    Hypertension    Lung cancer ([place])    On home oxygen therapy    "2.5L; 24/7" (03/10/2017)   Pericardial effusion    a. small-moderate in 07/2016.   Pneumonia    Pulmonary hypertension ([place])    UTI (urinary tract infection)     PAST SURGICAL HISTORY:  Past Surgical History:  Procedure Laterality Date   ANKLE FUSION Right 02/11/2019   Procedure: RIGHT ANKLE FUSION;  Surgeon: Newt Minion, MD;  Location: [facility];  Service: Orthopedics;  Laterality: Right;   APPLICATION OF WOUND VAC Right 11/23/2018   Procedure: Application Of Wound Vac;  Surgeon: Newt Minion, MD;  Location: [facility];  Service: Orthopedics;  Laterality: Right;   CARDIAC CATHETERIZATION     1995 or 109  [facility] [place]     bilateral cataracts   HARDWARE REMOVAL Right 02/11/2019   Procedure: REMOVAL DEEP HARDWARE RIGHT ANKLE;  Surgeon: Newt Minion, MD;  Location: [facility];  Service: Orthopedics;  Laterality: Right;   LEFT HEART CATHETERIZATION WITH CORONARY ANGIOGRAM N/A 07/12/2014   Procedure: LEFT HEART CATHETERIZATION WITH CORONARY ANGIOGRAM;  Surgeon: [name], MD;  Location[facility] CATH LAB;  Service: Cardiovascular;  Laterality: N/A;   MAXIMUM ACCESS (MAS)POSTERIOR LUMBAR INTERBODY FUSION (PLIF) 2 LEVEL N/A 02/22/2016   Procedure: Lumbar three-four - Lumbar  four-five  MAXIMUM ACCESS SURGERY  POSTERIOR LUMBAR INTERBODY FUSION;  Surgeon: [name], MD;  Location: [facility]URO [facility];  Service: Neurosurgery;  Laterality: N/A;   ORIF ANKLE FRACTURE Right 11/23/2018   Procedure: OPEN REDUCTION INTERNAL FIXATION (ORIF) RIGHT ANKLE FRACTURE;  Surgeon: Newt Minion, MD;  Location: [facility];  Service: Orthopedics;  Laterality: Right;   ORIF TOE FRACTURE Left 06/12/2017   Procedure: OPEN REDUCTION INTERNAL FIXATION (ORIF) BASE 1ST METATARSAL (TOE) FRACTURE;  Surgeon: Newt Minion, MD;  Location: [place];  Service: Orthopedics;  Laterality: Left;   RIGHT HEART CATH N/A 08/06/2017   Procedure: RIGHT HEART CATH;  Surgeon: Larey Dresser, MD;  Location: [facility] CV LAB;  Service: Cardiovascular;  Laterality: N/A;   THORACOTOMY Right 2010   lower   TONSILLECTOMY      ALLERGIES:  is allergic to amoxicillin.   MEDICATIONS:  Current Facility-Administered Medications  Medication Dose Route Frequency Provider Last Rate Last Dose   albuterol (PROVENTIL) (2.5 MG/3ML) 0.083% nebulizer solution 2.5 mg  2.5 mg Nebulization Q4H PRN Cyndia Skeeters, Taye T, MD   2.5 mg at 02/22/19 0503   albuterol (PROVENTIL) (2.5 MG/3ML) 0.083% nebulizer solution 2.5 mg  2.5 mg Nebulization TID Wendee Beavers T, MD   2.5 mg at 02/26/19 0736   arformoterol (BROVANA) nebulizer solution 15 mcg  15 mcg Nebulization BID Wendee Beavers T, MD   15 mcg at 02/26/19 0736   atorvastatin (LIPITOR) tablet 40 mg  40 mg Oral q1800 Wendee Beavers T, MD   40 mg at 02/25/19 1824   azelastine (ASTELIN) 0.1 % nasal spray 1 spray  1 spray Each Nare BID Wendee Beavers T, MD   1 spray at 02/26/19 0928   azithromycin (ZITHROMAX) tablet 500 mg  500 mg Oral Daily Raiford Noble [place], DO   500 mg at 02/26/19 0925   budesonide (PULMICORT) nebulizer solution 0.5 mg  0.5 mg Nebulization BID Wendee Beavers T, MD   0.5 mg at 02/26/19 0736   docusate sodium (COLACE) capsule 100 mg  100 mg Oral BID Wendee Beavers T, MD   100 mg at  02/26/19 0925   feeding supplement (GLUCERNA SHAKE) (GLUCERNA SHAKE) liquid 237 mL  237 mL Oral BID BM Gonfa, Taye T, MD       furosemide (LASIX) tablet 60 mg  60 mg Oral BID Bensimhon, Shaune Pascal, MD       gabapentin (NEURONTIN) capsule 300 mg  300 mg Oral TID Wendee Beavers T, MD   300 mg at 02/26/19 0925   guaiFENesin (MUCINEX) 12 hr tablet 600 mg  600 mg Oral BID Wendee Beavers  T, MD   600 mg at 02/26/19 0956   heparin injection 5,000 Units  5,000 Units Subcutaneous Q8H Wendee Beavers T, MD   5,000 Units at 02/26/19 1236   HYDROcodone-acetaminophen (NORCO/VICODIN) 5-325 MG per tablet 1 tablet  1 tablet Oral Q4H PRN Mercy Riding, MD   1 tablet at 02/26/19 0925   insulin aspart (novoLOG) injection 0-20 Units  0-20 Units Subcutaneous TID WC Mercy Riding, MD   11 Units at 02/26/19 1237   insulin aspart (novoLOG) injection 0-5 Units  0-5 Units Subcutaneous QHS Wendee Beavers T, MD   4 Units at 02/25/19 2138   insulin aspart (novoLOG) injection 5 Units  5 Units Subcutaneous TID WC Wendee Beavers T, MD   5 Units at 02/26/19 1237   insulin detemir (LEVEMIR) injection 25 Units  25 Units Subcutaneous BID Wendee Beavers T, MD   25 Units at 02/26/19 0926   iron polysaccharides (NIFEREX) capsule 150 mg  150 mg Oral BID Wendee Beavers T, MD   150 mg at 02/26/19 0926   macitentan (OPSUMIT) tablet 10 mg  10 mg Oral Daily Wendee Beavers T, MD   10 mg at 02/26/19 0926   MEDLINE mouth rinse  15 mL Mouth Rinse BID Wendee Beavers T, MD   15 mL at 02/26/19 0929   methylPREDNISolone sodium succinate (SOLU-MEDROL) 40 mg/mL injection 40 mg  40 mg Intravenous Q8H Sheikh, Omair Latif, DO   40 mg at 02/26/19 1236   metoCLOPramide (REGLAN) tablet 5-10 mg  5-10 mg Oral Q8H PRN Mercy Riding, MD       Or   metoCLOPramide (REGLAN) injection 5-10 mg  5-10 mg Intravenous Q8H PRN Gonfa, Taye T, MD       midodrine (PROAMATINE) tablet 5 mg  5 mg Oral TID WC Gonfa, Taye T, MD   5 mg at 02/26/19 1236   morphine 2 MG/ML injection 0.5-1 mg   0.5-1 mg Intravenous Q2H PRN Wendee Beavers T, MD       ondansetron (ZOFRAN) tablet 4 mg  4 mg Oral Q6H PRN Wendee Beavers T, MD   4 mg at 02/20/19 0848   Or   ondansetron (ZOFRAN) injection 4 mg  4 mg Intravenous Q6H PRN Wendee Beavers T, MD   4 mg at 02/13/19 0133   potassium chloride SA (K-DUR) CR tablet 20 mEq  20 mEq Oral BID Wendee Beavers T, MD   20 mEq at 02/26/19 0925   Selexipag (Uptravi) 600 mg per 3 tablets - patient's own supply  600 mcg Oral BID Wendee Beavers T, MD   600 mcg at 02/26/19 0956   sertraline (ZOLOFT) tablet 75 mg  75 mg Oral Daily Gonfa, Taye T, MD   75 mg at 02/26/19 0925   sodium chloride (OCEAN) 0.65 % nasal spray 1 spray  1 spray Each Nare PRN Mercy Riding, MD       traMADol (ULTRAM) tablet 50 mg  50 mg Oral Q6H Gonfa, Taye T, MD   50 mg at 02/26/19 1236   umeclidinium bromide (INCRUSE ELLIPTA) 62.5 MCG/INH 1 puff  1 puff Inhalation Daily Wendee Beavers T, MD   1 puff at 02/26/19 0735   vitamin C (ASCORBIC ACID) tablet 250 mg  250 mg Oral BID Wendee Beavers T, MD   250 mg at 02/26/19 8416   Facility-Administered Medications Ordered in Other Encounters  Medication Dose Route Frequency Provider Last Rate Last Dose   diatrizoate meglumine-sodium (GASTROGRAFIN) 66-10 % solution 30 mL  30 mL Oral PRN Bjorn Loser, MD   30 mL at 11/30/15 0820    VITAL SIGNS: BP 123/62 (BP Location: Left Arm)    Pulse 72    Temp 97.9 F (36.6 C) (Oral)    Resp 19    Ht _0  (1.575 m)    Wt 76 kg    SpO2 95%    BMI 30.65 kg/m  Filed Weights   02/24/19 0214 02/25/19 0659 02/26/19 0100  Weight: 76.5 kg 76.5 kg 76 kg    Estimated body mass index is 30.65 kg/m as calculated from the following:   Height as of this encounter: _1  (1.575 m).   Weight as of this encounter: 76 kg.  LABS: CBC:    Component Value Date/Time   WBC 13.4 (H) 02/26/2019 0208   HGB 8.8 (L) 02/26/2019 0208   HGB 10.0 (L) 01/04/2019 1002   HGB WILL FOLLOW 07/29/2016 1159   HGB 12.9 07/29/2016 1159   HGB 15.8  11/30/2015 0758   HCT 29.9 (L) 02/26/2019 0208   HCT WILL FOLLOW 07/29/2016 1159   HCT 42.6 07/29/2016 1159   HCT 47.5 (H) 11/30/2015 0758   PLT 296 02/26/2019 0208   PLT 188 01/04/2019 1002   PLT WILL FOLLOW 07/29/2016 1159   PLT 238 07/29/2016 1159   Comprehensive Metabolic Panel:    Component Value Date/Time   NA 139 02/26/2019 0208   NA 137 04/02/2017 1535   NA 136 11/30/2015 0758   K 3.8 02/26/2019 0208   K 4.2 11/30/2015 0758   CO2 28 02/26/2019 0208   CO2 27 11/30/2015 0758   BUN 61 (H) 02/26/2019 0208   BUN 27 04/02/2017 1535   BUN 19.0 11/30/2015 0758   CREATININE 1.18 (H) 02/26/2019 0208   CREATININE 1.06 (H) 01/04/2019 1002   CREATININE 0.77 03/28/2016 1504   CREATININE 1.2 (H) 11/30/2015 0758   ALBUMIN 3.4 (L) 02/26/2019 0208   ALBUMIN 3.7 05/14/2017 1201   ALBUMIN 3.7 11/30/2015 0758     Review of Systems  Respiratory: Positive for shortness of breath.   Neurological: Positive for weakness.  Unless otherwise noted, a complete review of systems is negative.  Physical Exam General: NAD, well developed appearing Cardiovascular: regular rate and rhythm Pulmonary: diminished bases, 20L HFNC Abdomen: soft, nontender, + bowel sounds Extremities: no edema, no joint deformities, right anklet ortho boot Skin: no rashes Neurological: Weakness, awake, alert, oriented x3   Prognosis: Guarded in the setting of COPD, home oxygen dependent, current requiring 20L HFNC, lung cancer s/p RLL segmentectomy and chemo, orthostatic hypotension, fall s/p right ankle fracture requiring ORIF, anemia, diabetes,  CKD 3, and acute on chronic diastolic CHF.   Discharge Planning:  To Be Determined  Recommendations:  Full Code-as confirmed by patient. Expresses she does not want to go back on BiPAP and is considering DNR/DNI however, still thinking and discussing with her children.   Continue current plan of care per attending  Is knowledgeable of outpatient palliative and  hospice services. Feels she has palliative support from outpatient nurse team. Declining additional outpatient palliative support.   PMT will continue to support and follow as needed. Please call if further needs to engage develops.    Palliative Performance Scale: PPS 40%                 Patient verbalized expressed understanding and was in agreement with this plan.   Thank you for allowing the Palliative Medicine Team to assist in the care  of this patient.  Time In: 1200 Time Out: 1305 Time Total: 65 min.   Visit consisted of counseling and education dealing with the complex and emotionally intense issues of symptom management and palliative care in the setting of serious and potentially life-threatening illness.Greater than 50%  of this time was spent counseling and coordinating care related to the above assessment and plan.  Signed by:  Alda Lea, AGPCNP-BC Palliative Medicine Team  Phone: [phone] Fax: [phone] Pager: [phone] Amion: Bjorn Pippin

## 2019-02-26 NOTE — Progress Notes (Signed)
I went in and talked to patient about using the Bipap tonight. Patient stated that she felt that she just could not do it.  She does not like the masks.  I tried to explain that if she could just tolerate it for a little while, it would make her feel better.  Patient was extremely undecided, she was not in any distress at the time.  Will continue to monitor.

## 2019-02-26 NOTE — Plan of Care (Signed)

## 2019-02-26 NOTE — Progress Notes (Signed)
PROGRESS NOTE    Amy Shepard  HKV:[id number] DOB: Sep 27, 1945 DOA: 02/11/2019 PCP: Maurice Small, MD   Brief Narrative:  The patient is a 73 year old with diabetes mellitus type 2, essential hypertension, diastolic CHF, chronic respiratory failure, pulmonary nodule, lung cancer, CKD stage III brought to the hospital for elective nonhealing right trimalleolar fracture. Underwent hardware removal and fusion of the ankle but postoperatively developed hypoxia requiring high flow oxygen therefore medical team consulted. Pulmonology was also consulted.  Still remains on 20 L of high flow nasal cannula but states that she is feeling better and renal function is improving.  Assessment & Plan:   Active Problems:   Chronic obstructive pulmonary disease ([name])   Type 2 diabetes mellitus with complication, with long-term current use of insulin ([name])   HTN (hypertension)   Acute respiratory failure with hypoxia ([name])   Chronic diastolic CHF (congestive heart failure) ([name])   Pulmonary fibrosis ([name])   Acute on chronic postoperative respiratory failure ([name])   Benign hypertensive heart and kidney disease with diastolic CHF, NYHA class II and CKD stage III ([name])   Charcot ankle, right   Pain from implanted hardware   Closed right ankle fracture, with malunion, subsequent encounter   Right upper lobe pulmonary nodule   Acute on chronic respiratory failure with hypoxia/hypercapnia likely secondary to CHF with pulmonary edema along with underlying pulmonary hypertension, currently stable -Usually On 4 L at home at baseline but requiring 20 L of high flow nasal cannula now -Likely a combination of acute on chronic CHF and COPD. -Manage COPD and CHF as below. -Wean oxygen as able; Cotninues to be on 20 liters and attempts to wean have been limited -C/w IV Diuresis per Cardiology Recc's and was changed to 60 mg p.o. twice daily by Cardiology. -CXR this AM showed " a concern for new densities along  the periphery of the right lower lung.  Findings could be related to overlying shadows or atelectasis but cannot exclude developing airspace disease"  -Continue to wean oxygen as tolerated-continuous pulse oximetry and maintain O2 saturations greater than 88%-94% per pulmonary recommendations -Currently getting azithromycin for COPD exacerbation and this was changed to p.o. yesterday and will continue for 1 more day for total 7 days -Pulmonary recommending continue bronchodilators and titrating down steroids as they are not sure if they are helping at this point -Continue with pulmonary toilet and as needed BiPAP for increased work of breathing -Continue with IV morphine 0.5 to 1 mg every 2 PRN for severe pain -Repeat chest x-ray in the a.m. -Fortunately her [facility] denial was upheld by [facility] and she will need to go home with either an IV or be weaned off 4 to 6 L of oxygen to see if she can go to a skilled nursing facility  Acute on chronic diastolic CHF/pulmonary hypertension:  -Echo on 8/3 with EF of 55 to 60%, DD and D-shaped interventricular septum.  -BNP elevated to 1400.  Marginal procalcitonin is 0.11 so do not feel this is infectious. -Appreciate cardiology assistance.   -Improving with Diuretics delineated by cardiology and they are changing back to p.o. diuretics with 60 mg twice daily today -Her Renal function is improving with diuresis and BUN/creatinine is now 61/1.18 -We will follow cardiology recommendations and they are recommending continuing fluid restriction -Continue with selexipag 600 mcg p.o. twice daily along with macitentan 10 mg p.o. daily -Continue strict I's and O's, daily weights monitoring renal function -We will also fluid restrict to 1500 mL's -Cardiology  recommending continue IV Lasix and dose was increased to IV 80 every 12 -She is -11.869 L since admission -Weight is down about 7 pounds since 02/22/2019  -Continue monitor volume status carefully  Acute on  chronic COPD -Improving. -Continue steroid, antibiotics and nebulizers.  Will wean steroids from 40 every 6 to 40 every 8h now to every 12 -Currently she is on Brovana and budesonide twice daily -Pulmonary recommending continue Incruse and albuterol every 3 times daily as well as as needed every 4 hours for wheezing shortness breath -Changed azithromycin to p.o. -Wean oxygen as able.  -Currently remains on 20 L high flow nasal cannula and weaning attempts have failed but will continue diuresis -As above  Suboptimally controlled DM-2 with hyperglycemia, renal complication and peripheral neuropathy.   -CBG's have been elevated and have ranged from 116 438 -Increased Levemir 25 units subcu twice daily to 28 units subcu twice daily as well as resistant NovoLog sliding scale insulin AC and at bedtime along with 8 units units subcu 3 times daily with meals -Recent hemoglobin A1c this morning was 6.9 -Likely blood sugars are worsened in the setting of steroids  CKD-3: Stable And improving -Continue monitoring while on diuretics. -BUN/creatinine is trending down today was 60/1.18; elevated BUN is likely from steroids -Avoid nephrotoxic medications if possible, contrast dyes as well as hypotension-continue to monitor trend renal function -Repeat CMP in the a.m.  Anemia of chronic disease.   -Anemia panel consistent with this.  H&H stable at 8.8/29.9 -Continue iron polysaccharide 150 mg p.o. twice daily -Need outpatient nephrology follow-up if she has not been contacted yet. -Continue to monitor for signs and symptoms of bleeding; currently no overt bleeding noted -Repeat CBC in the a.m.   History of right ankle fracture status post ORIF in 5/20 Mechanical fall at home  Nonunion of right ankle fracture -Status post debridement removal of hardware, right ankle fusion by Dr. Sharol Given -Management per orthopedic surgery  Hypertension -Continue current cardiac regimen  Orthostatic hypotension  -Continue with Midodrine milligrams p.o. 3 times daily  Depression and Anxiety Plan continue with sertraline 75 mg p.o. daily  History of lung cancer status post RLL superior segmentectomy and adjuvant chemotherapy.  Currently on observation. -Outpatient follow-up -Continue Treatment As above  Obesity -Estimated body mass index is 30.65 kg/m as calculated from the following:   Height as of this encounter: _0  (1.575 m).   Weight as of this encounter: 76 kg. -Weight Loss and Dietary Counseling Given   DVT prophylaxis: Heparin 5,000 sq q8h Code Status: FULL CODE  Family Communication: No family present at bedside Disposition Plan: Pending Primary Clearance; Unfortunately not eligible to go back to LTAC will need to either go home with an IV or will need to be weaned down to 4 to 6 L of oxygen to see if she can go to SNF or have NIV set up for home  Consultants:   [facility]  Cardiology  Pulmonary   Procedures:  02/11/2019-removal of deep hardware right ankle and right ankle fusion by Dr. Sharol Given.   Antimicrobials: Anti-infectives (From admission, onward)   Start     Dose/Rate Route Frequency Ordered Stop   02/25/19 1915  azithromycin (ZITHROMAX) tablet 500 mg     500 mg Oral Daily 02/25/19 1912     02/21/19 1200  azithromycin (ZITHROMAX) 500 mg in sodium chloride 0.9 % 250 mL IVPB  Status:  Discontinued     500 mg 250 mL/hr over 60 Minutes Intravenous Every 24  hours 02/21/19 1107 02/25/19 1912   02/11/19 0630  vancomycin (VANCOCIN) IVPB 1000 mg/200 mL premix     1,000 mg 200 mL/hr over 60 Minutes Intravenous To Short Stay 02/10/19 0757 02/11/19 0718   02/10/19 0800  vancomycin (VANCOCIN) IVPB 1000 mg/200 mL premix  Status:  Discontinued     1,000 mg 200 mL/hr over 60 Minutes Intravenous To Short Stay 02/10/19 0755 02/10/19 0757     Subjective: Seen and examined at bedside and states that her respiratory status was stable.  No chest pain, lightheadedness or dizziness.  No  nausea or vomiting.  Was disappointed that her denial was upheld by [facility].  No other concerns or complaints at this time is feeling a little bit better.  Objective: Vitals:   02/26/19 0851 02/26/19 1032 02/26/19 1335 02/26/19 1601  BP:  123/62  (!) 121/59  Pulse:      Resp:   15   Temp: 97.7 F (36.5 C) 97.9 F (36.6 C)  97.7 F (36.5 C)  TempSrc: Oral Oral  Oral  SpO2:    90%  Weight:      Height:        Intake/Output Summary (Last 24 hours) at 02/26/2019 1648 Last data filed at 02/26/2019 1500 Gross per 24 hour  Intake 1040 ml  Output 1800 ml  Net -760 ml   Filed Weights   02/24/19 0214 02/25/19 0659 02/26/19 0100  Weight: 76.5 kg 76.5 kg 76 kg   Examination: Physical Exam:  Constitutional: Well-nourished, well-developed obese Caucasian female currently no acute distress but appears calm and comfortable Eyes: Lids extract are normal.  Sclera anicteric ENMT: External ears nose appear normal.  Grossly normal hearing.  Mucous members are Neck: Appears supple no JVD Respiratory: Diminished auscultation bilaterally with no appreciable wheezing, rales, rhonchi.  Patient was not tachypneic or using accessory muscles to breathe but is on 20 L of supplemental oxygen via high flow nasal cannula Cardiovascular: Regular rate and rhythm.  No appreciable murmurs, rubs, gallops.  No appreciable lower extremity edema noted Abdomen: Soft, nontender, distended second body habitus.  Bowel sounds present. GU: Deferred Musculoskeletal: No contractures or cyanosis.  She is wearing a boot on her left foot and it is wrapped in Ace bandage Skin: No appreciable rashes or lesions on limited skin evaluation. Neurologic: Cranial nerves II through XII grossly intact with no appreciable focal deficits.  Romberg sign cerebellar reflexes were not Psychiatric: Normal judgment and insight.  Patient is awake, alert, oriented.  Pleasant mood and affect  Data Reviewed: I have personally reviewed following labs  and imaging studies  CBC: Recent Labs  Lab 02/20/19 0235 02/22/19 0736 02/23/19 0231 02/24/19 0325 02/25/19 0236 02/26/19 0208  WBC 8.1 8.5 10.8* 12.3* 12.1* 13.4*  NEUTROABS 6.4 8.0*  --   --   --  11.4*  HGB 7.8* 8.4* 7.7* 7.6* 7.7* 8.8*  HCT 25.8* 28.3* 25.5* 26.2* 26.3* 29.9*  MCV 82.2 83.0 82.8 85.1 83.8 83.5  PLT 184 224 224 244 258 932   Basic Metabolic Panel: Recent Labs  Lab 02/22/19 0736 02/23/19 0231 02/24/19 0325 02/25/19 0236 02/26/19 0208  NA 139 138 140 139 139  K 4.1 3.8 3.6 4.1 3.8  CL 92* 95* 99 101 99  CO2 35* 32 _0 GLUCOSE 205* 271* 243* 197* 195*  BUN 56* 61* 57* 60* 61*  CREATININE 1.52* 1.51* 1.55* 1.38* 1.18*  CALCIUM 9.3 9.2 9.3 9.3 9.5  MG  --   --  2.3 2.4 2.4  PHOS  --   --   --   --  3.9   GFR: Estimated Creatinine Clearance: 41.2 mL/min (A) (by C-G formula based on SCr of 1.18 mg/dL (H)). Liver Function Tests: Recent Labs  Lab 02/26/19 0208  AST 27  ALT 32  ALKPHOS 142*  BILITOT 0.4  PROT 6.8  ALBUMIN 3.4*   No results for input(s): LIPASE, AMYLASE in the last 168 hours. No results for input(s): AMMONIA in the last 168 hours. Coagulation Profile: No results for input(s): INR, PROTIME in the last 168 hours. Cardiac Enzymes: No results for input(s): CKTOTAL, CKMB, CKMBINDEX, TROPONINI in the last 168 hours. BNP (last 3 results) No results for input(s): PROBNP in the last 8760 hours. HbA1C: Recent Labs    02/25/19 0236  HGBA1C 6.9*   CBG: Recent Labs  Lab 02/25/19 1557 02/25/19 2125 02/26/19 0611 02/26/19 1117 02/26/19 1623  GLUCAP 273* 338* 116* 287* 438*   Lipid Profile: No results for input(s): CHOL, HDL, LDLCALC, TRIG, CHOLHDL, LDLDIRECT in the last 72 hours. Thyroid Function Tests: No results for input(s): TSH, T4TOTAL, FREET4, T3FREE, THYROIDAB in the last 72 hours. Anemia Panel: Recent Labs    02/24/19 0811  VITAMINB12 566  FOLATE 10.8  FERRITIN 20  TIBC 414  IRON 59  RETICCTPCT 3.0    Sepsis Labs: Recent Labs  Lab 02/24/19 0325  PROCALCITON 0.11    No results found for this or any previous visit (from the past 240 hour(s)).   Radiology Studies: Dg Chest Port 1 View  Result Date: 02/26/2019 CLINICAL DATA:  Shortness of breath. EXAM: PORTABLE CHEST 1 VIEW COMPARISON:  02/24/2019 FINDINGS: Stable cardiomegaly. Slightly decreased lung volumes. Hazy densities in the periphery of the right lower chest are nonspecific. Probable old right lower rib fractures. Atherosclerotic calcifications at the aortic arch. No frank pulmonary edema. Postoperative changes in the right lung. IMPRESSION: Concern for new densities along the periphery of the right lower lung. Findings could be related to overlying shadows or atelectasis but cannot exclude developing airspace disease. Recommend continued follow-up. Electronically Signed   By: Markus Daft M.D.   On: 02/26/2019 09:50   Scheduled Meds: . albuterol  2.5 mg Nebulization TID  . arformoterol  15 mcg Nebulization BID  . atorvastatin  40 mg Oral q1800  . azelastine  1 spray Each Nare BID  . azithromycin  500 mg Oral Daily  . budesonide (PULMICORT) nebulizer solution  0.5 mg Nebulization BID  . docusate sodium  100 mg Oral BID  . feeding supplement (GLUCERNA SHAKE)  237 mL Oral BID BM  . furosemide  60 mg Oral BID  . gabapentin  300 mg Oral TID  . guaiFENesin  600 mg Oral BID  . heparin  5,000 Units Subcutaneous Q8H  . insulin aspart  0-20 Units Subcutaneous TID WC  . insulin aspart  0-5 Units Subcutaneous QHS  . insulin aspart  5 Units Subcutaneous Once  . insulin aspart  8 Units Subcutaneous TID WC  . insulin detemir  28 Units Subcutaneous BID  . iron polysaccharides  150 mg Oral BID  . macitentan  10 mg Oral Daily  . mouth rinse  15 mL Mouth Rinse BID  . [START ON 02/27/2019] methylPREDNISolone (SOLU-MEDROL) injection  40 mg Intravenous Q12H  . midodrine  5 mg Oral TID WC  . potassium chloride  20 mEq Oral BID  . Selexipag  600  mcg Oral BID  . sertraline  75 mg Oral  Daily  . traMADol  50 mg Oral Q6H  . umeclidinium bromide  1 puff Inhalation Daily  . vitamin C  250 mg Oral BID   Continuous Infusions:   LOS: 15 days   Kerney Elbe, DO [facility] PAGER is on [place]  If 7PM-7AM, please contact night-coverage [email or web address] Password [facility] 02/26/2019, 4:48 PM

## 2019-02-26 NOTE — Progress Notes (Deleted)
TOC CM-referral [facility]  Contacted [facility] to follow up on [facility]. Waiting confirmation referral was received. Jonnie Finner RN CCM Case Mgmt phone [phone]

## 2019-02-26 NOTE — Progress Notes (Signed)
Patient ID: Amy Shepard, female   DOB: January 14, 1946, 73 y.o.   MRN: [id number]      Advanced Heart Failure Rounding Note  PCP-Cardiologist: No primary care provider on file.   Subjective:    Remains on IV lasix but weight only down a pound. Drinking a lot of fluids (which is typical for her). Feels her breathing is better. Still on HFNC at 20L   Objective:   Weight Range: 76 kg Body mass index is 30.65 kg/m.   Vital Signs:   Temp:  [97.6 F (36.4 C)-98.5 F (36.9 C)] 97.9 F (36.6 C) (08/15 1032) Pulse Rate:  [68-76] 72 (08/15 0432) Resp:  [15-19] 19 (08/15 0432) BP: (97-136)/(51-83) 123/62 (08/15 1032) SpO2:  [92 %-100 %] 95 % (08/15 0432) FiO2 (%):  [55 %-70 %] 55 % (08/15 1032) Weight:  [76 kg] 76 kg (08/15 0100) Last BM Date: 02/26/19  Weight change: Filed Weights   02/24/19 0214 02/25/19 0659 02/26/19 0100  Weight: 76.5 kg 76.5 kg 76 kg    Intake/Output:   Intake/Output Summary (Last 24 hours) at 02/26/2019 1052 Last data filed at 02/26/2019 0916 Gross per 24 hour  Intake 820 ml  Output 2600 ml  Net -1780 ml      Physical Exam    General:  Sitting in chair on HFNC. No resp difficulty HEENT: normal Neck: supple. JVP 8 . Carotids 2+ bilat; no bruits. No lymphadenopathy or thryomegaly appreciated. Cor: PMI nondisplaced. Regular rate & rhythm. No rubs, gallops or murmurs. Lungs: clear on HF O2 Abdomen: soft, nontender, nondistended. No hepatosplenomegaly. No bruits or masses. Good bowel sounds. Extremities: no cyanosis, clubbing, rash, edema R LE boot Neuro: alert & orientedx3, cranial nerves grossly intact. moves all 4 extremities w/o difficulty. Affect pleasant  Telemetry     NSR 60-70s Personally reviewed    Labs    CBC Recent Labs    02/25/19 0236 02/26/19 0208  WBC 12.1* 13.4*  NEUTROABS  --  11.4*  HGB 7.7* 8.8*  HCT 26.3* 29.9*  MCV 83.8 83.5  PLT 258 010   Basic Metabolic Panel Recent Labs    02/25/19 0236 02/26/19 0208   NA 139 139  K 4.1 3.8  CL 101 99  CO2 27 28  GLUCOSE 197* 195*  BUN 60* 61*  CREATININE 1.38* 1.18*  CALCIUM 9.3 9.5  MG 2.4 2.4  PHOS  --  3.9   Liver Function Tests Recent Labs    02/26/19 0208  AST 27  ALT 32  ALKPHOS 142*  BILITOT 0.4  PROT 6.8  ALBUMIN 3.4*   No results for input(s): LIPASE, AMYLASE in the last 72 hours. Cardiac Enzymes No results for input(s): CKTOTAL, CKMB, CKMBINDEX, TROPONINI in the last 72 hours.  BNP: BNP (last 3 results) Recent Labs    04/09/18 1749 02/13/19 1805 02/24/19 0325  BNP 297.8* 1,211.6* 1,344.9*    ProBNP (last 3 results) No results for input(s): PROBNP in the last 8760 hours.   D-Dimer No results for input(s): DDIMER in the last 72 hours. Hemoglobin A1C Recent Labs    02/25/19 0236  HGBA1C 6.9*   Fasting Lipid Panel No results for input(s): CHOL, HDL, LDLCALC, TRIG, CHOLHDL, LDLDIRECT in the last 72 hours. Thyroid Function Tests No results for input(s): TSH, T4TOTAL, T3FREE, THYROIDAB in the last 72 hours.  Invalid input(s): FREET3  Other results:   Imaging    Dg Chest Port 1 View  Result Date: 02/26/2019 CLINICAL DATA:  Shortness of breath.  EXAM: PORTABLE CHEST 1 VIEW COMPARISON:  02/24/2019 FINDINGS: Stable cardiomegaly. Slightly decreased lung volumes. Hazy densities in the periphery of the right lower chest are nonspecific. Probable old right lower rib fractures. Atherosclerotic calcifications at the aortic arch. No frank pulmonary edema. Postoperative changes in the right lung. IMPRESSION: Concern for new densities along the periphery of the right lower lung. Findings could be related to overlying shadows or atelectasis but cannot exclude developing airspace disease. Recommend continued follow-up. Electronically Signed   By: Markus Daft M.D.   On: 02/26/2019 09:50     Medications:     Scheduled Medications: . albuterol  2.5 mg Nebulization TID  . arformoterol  15 mcg Nebulization BID  . atorvastatin   40 mg Oral q1800  . azelastine  1 spray Each Nare BID  . azithromycin  500 mg Oral Daily  . budesonide (PULMICORT) nebulizer solution  0.5 mg Nebulization BID  . docusate sodium  100 mg Oral BID  . feeding supplement (GLUCERNA SHAKE)  237 mL Oral BID BM  . furosemide  80 mg Intravenous Q12H  . gabapentin  300 mg Oral TID  . guaiFENesin  600 mg Oral BID  . heparin  5,000 Units Subcutaneous Q8H  . insulin aspart  0-20 Units Subcutaneous TID WC  . insulin aspart  0-5 Units Subcutaneous QHS  . insulin aspart  5 Units Subcutaneous TID WC  . insulin detemir  25 Units Subcutaneous BID  . iron polysaccharides  150 mg Oral BID  . macitentan  10 mg Oral Daily  . mouth rinse  15 mL Mouth Rinse BID  . methylPREDNISolone (SOLU-MEDROL) injection  40 mg Intravenous Q8H  . midodrine  5 mg Oral TID WC  . potassium chloride  20 mEq Oral BID  . Selexipag  600 mcg Oral BID  . sertraline  75 mg Oral Daily  . traMADol  50 mg Oral Q6H  . umeclidinium bromide  1 puff Inhalation Daily  . vitamin C  250 mg Oral BID    Infusions:   PRN Medications: albuterol, HYDROcodone-acetaminophen, metoCLOPramide **OR** metoCLOPramide (REGLAN) injection, morphine injection, ondansetron **OR** ondansetron (ZOFRAN) IV, sodium chloride   Assessment/Plan   1. Acute on chronic hypoxemic respiratory failure: She is now on high flow oxygen.  She is on home oxygen at baseline.  Past PFTs were restrictive, but last CT chest in 6/20 showed emphysema.  She is being treated for AECOPD and is being diuresed for CHF.  - Continue Solumedrol per primary service.  She has completed azithromycin course.  - I think volume status ir optimized but remains on HFNC 20 liters. Weight at least 5 pounds below previous baseline. .  - Volume management complicated by her incessant fluid consumption which I have d/w numerous times in past.  - Will switch back to po diuretics.  2. 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 Serologies  negative. Restrictive PFTs but lung parenchyma looked relatively normal on 1/18 CTA chest. Findings by PFTs and CT not consistent with emphysema. V/Q scan negative for chronic or acute PEs. Serologic workup was negative. RHC (1/19) showed moderate pulmonary arterial hypertension. Echo in 8/18 showed dilated and dysfunctional RV, similar in 11/18 and 9/19. Echo in 1/47 showed PA systolic pressure still very high at 81 mmHg. Sleep study showed nocturnal hypoxemia but not OSA. She wears home oxygen. She is currently on Opsumit and selexipag. Echo in 8/20 showed normal LV EF, moderate RV dilation/moderate dysfunction with D-shaped septum and PASP 80 mmHg, mild AS.  -  Continue current dose of selexipag. Intolerant higher doses..  - Continue Opsumit.  - Given visual changes, have not had her restart tadalafil.   3. Acute on chronic diastolic CHF: With prominent RV failure. - Volume status appears optimized.  Weight at least 5 pounds below previous baseline.  - Volume management complicated by her incessant fluid consumption which I have d/w numerous times in past.  - Will switch back to po diuretics.  4. CKD: Stage 3. Creatinine 1.18 today 5. CAD: Nonobstructive in 2015. No exertional CP.  - Continue atorvastatin 40 mg daily.  6. Orthostatic hypotension: RV failure may play a role.  -Continuemidodrine 5 mg TID 7. R ankle Fracture, S/P ORIF 11/26/2018.  Now this admission s/p debridement, removal hardware, R ankle fusion.  - PT. Per Dr. Sharol Given.   Will change diuretics back to po and follow at a distance. Patient encouraged to limit fluid intake.   Length of Stay: [place], MD  02/26/2019, 10:52 AM  Advanced Heart Failure Team Pager [phone] (M-F; 7a - 4p)  Please contact [facility] for night-coverage after hours (4p -7a ) and weekends on [email or web address]  Patient seen with NP, agree with the above note.    She diuresed reasonably well again yesterday, BUN/creatinine remain  stable.   She is still on 20 L/min high flow nasal cannula.  She remains on Solumedrol.   General: NAD Neck: JVP 9-10 cm, no thyromegaly or thyroid nodule.  Lungs: Distant BS CV: Nondisplaced PMI.  Heart regular S1/S2, no S3/S4, no murmur.  No peripheral edema.   Abdomen: Soft, nontender, no hepatosplenomegaly, no distention.  Skin: Intact without lesions or rashes.  Neurologic: Alert and oriented x 3.  Psych: Normal affect. Extremities: No clubbing or cyanosis.  HEENT: Normal.   Volume status improving though still mildly volume overloaded on exam.   - Continue Lasix 80 mg IV bid today, reassess tomorrow, ?back to po.   Still requiring high flow oxygen.  CXR with left basilar infiltrate.  Has completed abx and is still getting Solumedrol for AECOPD per primary service.   Continue her home [place] meds.   Quillian Quince  02/26/2019 10:52 AM

## 2019-02-26 NOTE — Progress Notes (Signed)
Subjective: 15 Days Post-Op Procedure(s) (LRB): REMOVAL DEEP HARDWARE RIGHT ANKLE (Right) RIGHT ANKLE FUSION (Right) Patient reports pain as mild.   Reports feeling very good today.  Objective: Vital signs in last 24 hours: Temp:  [97.6 F (36.4 C)-98.5 F (36.9 C)] 98.5 F (36.9 C) (08/15 0432) Pulse Rate:  [67-76] 72 (08/15 0432) Resp:  [13-19] 19 (08/15 0432) BP: (97-136)/(51-83) 118/75 (08/15 0432) SpO2:  [89 %-100 %] 95 % (08/15 0432) FiO2 (%):  [55 %-70 %] 55 % (08/15 0100) Weight:  [76 kg] 76 kg (08/15 0100)  Intake/Output from previous day: 08/14 0701 - 08/15 0700 In: 360 [P.O.:360] Out: 2200 [Urine:2200] Intake/Output this shift: No intake/output data recorded.  Recent Labs    02/24/19 0325 02/25/19 0236 02/26/19 0208  HGB 7.6* 7.7* 8.8*   Recent Labs    02/25/19 0236 02/26/19 0208  WBC 12.1* 13.4*  RBC 3.14* 3.58*  HCT 26.3* 29.9*  PLT 258 296   Recent Labs    02/25/19 0236 02/26/19 0208  NA 139 139  K 4.1 3.8  CL 101 99  CO2 27 28  BUN 60* 61*  CREATININE 1.38* 1.18*  GLUCOSE 197* 195*  CALCIUM 9.3 9.5   No results for input(s): LABPT, INR in the last 72 hours.  Right ankle anterior incision with minimal bloody drainage, medial incision with small area of epidermolysis, but no drainage. No signs of cellulitis. Some pitting edema of the right leg above the Ace wrap today.    Assessment/Plan: 15 Days Post-Op Procedure(s) (LRB): REMOVAL DEEP HARDWARE RIGHT ANKLE (Right) RIGHT ANKLE FUSION (Right) S/P right ankle fusion- POD #15- continue dry dressings, boot when OOB Chronic respiratory failure and HF- Appreciate consultants care. Dispo- Hopefully to SNF as O2 weaned.    Erlinda Hong, PA-C 02/26/2019, 7:19 AM  [facility] [phone]

## 2019-02-27 ENCOUNTER — Inpatient Hospital Stay (HOSPITAL_COMMUNITY): Payer: Medicare Other

## 2019-02-27 LAB — GLUCOSE, CAPILLARY
Glucose-Capillary: 135 mg/dL — ABNORMAL HIGH (ref 70–99)
Glucose-Capillary: 234 mg/dL — ABNORMAL HIGH (ref 70–99)
Glucose-Capillary: 179 mg/dL — ABNORMAL HIGH (ref 70–99)
Glucose-Capillary: 239 mg/dL — ABNORMAL HIGH (ref 70–99)

## 2019-02-27 LAB — CBC WITH DIFFERENTIAL/PLATELET
Abs Immature Granulocytes: 0.28 10*3/uL — ABNORMAL HIGH (ref 0.00–0.07)
Basophils Absolute: 0 10*3/uL (ref 0.0–0.1)
Basophils Relative: 0 %
Eosinophils Absolute: 0.1 10*3/uL (ref 0.0–0.5)
Eosinophils Relative: 1 %
HCT: 30.1 % — ABNORMAL LOW (ref 36.0–46.0)
Hemoglobin: 8.9 g/dL — ABNORMAL LOW (ref 12.0–15.0)
Immature Granulocytes: 2 %
Lymphocytes Relative: 4 %
Lymphs Abs: 0.6 10*3/uL — ABNORMAL LOW (ref 0.7–4.0)
MCH: 24.7 pg — ABNORMAL LOW (ref 26.0–34.0)
MCHC: 29.6 g/dL — ABNORMAL LOW (ref 30.0–36.0)
MCV: 83.4 fL (ref 80.0–100.0)
Monocytes Absolute: 1.2 10*3/uL — ABNORMAL HIGH (ref 0.1–1.0)
Monocytes Relative: 9 %
Neutro Abs: 10.9 10*3/uL — ABNORMAL HIGH (ref 1.7–7.7)
Neutrophils Relative %: 84 %
Platelets: 302 10*3/uL (ref 150–400)
RBC: 3.61 MIL/uL — ABNORMAL LOW (ref 3.87–5.11)
RDW: 18.9 % — ABNORMAL HIGH (ref 11.5–15.5)
WBC: 13 10*3/uL — ABNORMAL HIGH (ref 4.0–10.5)
nRBC: 0.4 % — ABNORMAL HIGH (ref 0.0–0.2)

## 2019-02-27 LAB — COMPREHENSIVE METABOLIC PANEL
ALT: 38 U/L (ref 0–44)
AST: 33 U/L (ref 15–41)
Albumin: 3.2 g/dL — ABNORMAL LOW (ref 3.5–5.0)
Alkaline Phosphatase: 136 U/L — ABNORMAL HIGH (ref 38–126)
Anion gap: 13 (ref 5–15)
BUN: 63 mg/dL — ABNORMAL HIGH (ref 8–23)
CO2: 28 mmol/L (ref 22–32)
Calcium: 9.1 mg/dL (ref 8.9–10.3)
Chloride: 98 mmol/L (ref 98–111)
Creatinine, Ser: 1.34 mg/dL — ABNORMAL HIGH (ref 0.44–1.00)
GFR calc Af Amer: 46 mL/min — ABNORMAL LOW (ref >60)
GFR calc non Af Amer: 39 mL/min — ABNORMAL LOW (ref >60)
Glucose, Bld: 119 mg/dL — ABNORMAL HIGH (ref 70–99)
Potassium: 4.1 mmol/L (ref 3.5–5.1)
Sodium: 139 mmol/L (ref 135–145)
Total Bilirubin: 0.6 mg/dL (ref 0.3–1.2)
Total Protein: 6 g/dL — ABNORMAL LOW (ref 6.5–8.1)

## 2019-02-27 LAB — PHOSPHORUS: Phosphorus: 3.6 mg/dL (ref 2.5–4.6)

## 2019-02-27 LAB — MAGNESIUM: Magnesium: 2.3 mg/dL (ref 1.7–2.4)

## 2019-02-27 MED ORDER — LEVALBUTEROL HCL 0.63 MG/3ML IN NEBU
0.6300 mg | INHALATION_SOLUTION | Freq: Four times a day (QID) | RESPIRATORY_TRACT | Status: DC
  Administered 2019-02-27 – 2019-03-01 (×11): 0.63 mg via RESPIRATORY_TRACT
  Filled 2019-02-27 (×11): qty 3

## 2019-02-27 MED ORDER — IPRATROPIUM BROMIDE 0.02 % IN SOLN
0.5000 mg | Freq: Four times a day (QID) | RESPIRATORY_TRACT | Status: DC
  Administered 2019-02-27 – 2019-03-01 (×11): 0.5 mg via RESPIRATORY_TRACT
  Filled 2019-02-27 (×11): qty 2.5

## 2019-02-27 NOTE — Progress Notes (Signed)
Patient ID: Amy Shepard, female   DOB: 07-Jul-1946, 72 y.o.   MRN: [id number] Patient is sitting up in a chair she is on 20 L of nasal cannula oxygen.  Her O2 sats are in the mid 80s.  Patient is comfortable without complaints.  She states that her lungs are feeling clear and she feels like she is diuresing well.

## 2019-02-27 NOTE — Plan of Care (Signed)

## 2019-02-27 NOTE — Progress Notes (Signed)
PROGRESS NOTE    Amy Shepard Coupland  KZS:[id number] DOB: 1946-05-02 DOA: 02/11/2019 PCP: Maurice Small, MD   Brief Narrative:  The patient is a 73 year old with diabetes mellitus type 2, essential hypertension, diastolic CHF, chronic respiratory failure, pulmonary nodule, lung cancer, CKD stage III brought to the hospital for elective nonhealing right trimalleolar fracture. Underwent hardware removal and fusion of the ankle but postoperatively developed hypoxia requiring high flow oxygen therefore medical team consulted. Pulmonology was also consulted.  Still remains on 20 L of high flow nasal cannula but states that she is feeling better and renal function is now stable.  Attempts to wean the patient further I have been unsuccessful and she is still requiring significant of oxygen.  Assessment & Plan:   Active Problems:   Chronic obstructive pulmonary disease ([name])   Type 2 diabetes mellitus with complication, with long-term current use of insulin ([name])   HTN (hypertension)   Acute respiratory failure with hypoxia ([name])   Chronic diastolic CHF (congestive heart failure) ([name])   Pulmonary fibrosis ([name])   Acute on chronic postoperative respiratory failure ([name])   Benign hypertensive heart and kidney disease with diastolic CHF, NYHA class II and CKD stage III ([name])   Charcot ankle, right   Pain from implanted hardware   Closed right ankle fracture, with malunion, subsequent encounter   Right upper lobe pulmonary nodule   Acute on chronic respiratory failure with hypoxia/hypercapnia likely secondary to CHF with pulmonary edema along with underlying pulmonary hypertension, currently stable -Usually On 4 L at home at baseline but requiring 20 L of high flow nasal cannula now and is been very difficult to wean; may need an Oxymizer but will defer this to pulmonary.  She also will likely need an NIV -Likely a combination of acute on chronic CHF and COPD. -Manage COPD and CHF as below. -Wean  oxygen as able; Cotninues to be on 20 liters and attempts to wean have been limited -C/w IV Diuresis per Cardiology Recc's now stopped and was changed to 60 mg p.o. twice daily by Cardiology. -CXR this AM showed " stable chest with cardiomegaly and chronic lung disease without acute superimposed findings"  -Continue to wean oxygen as tolerated-continuous pulse oximetry and maintain O2 saturations greater than 88%-94% per pulmonary recommendations -Currently getting azithromycin for COPD exacerbation and this was changed to p.o. yesterday and will now stop as she is gotten 7 days total -Pulmonary recommending continue bronchodilators and titrating down steroids as they are not sure if they are helping at this point -Continue with pulmonary toilet and as needed BiPAP for increased work of breathing -Continue with IV morphine 0.5 to 1 mg every 2 PRN for severe pain -Repeat chest x-ray in the a.m. -Unfortunately her [facility] denial was upheld by [facility] and she will need to go home with either an IV or be weaned off 4 to 6 L of oxygen to see if she can go to a skilled nursing facility; she still requires significant amount of oxygen and may need an NIV -If continues to require significant amounts oxygen may need to reconsult pulmonary for further evaluation and recommendations but in the interim we have changed some breathing treatments to Xopenex and Atrovent scheduled  Acute on chronic diastolic CHF/pulmonary hypertension:  -Echo on 8/3 with EF of 55 to 60%, DD and D-shaped interventricular septum.  -BNP elevated to 1400.  Marginal procalcitonin is 0.11 so do not feel this is infectious. -Appreciate cardiology assistance.   -Improving with Diuretics delineated  by cardiology and they are changing back to p.o. diuretics with 60 mg twice daily today -Her Renal function is improving with diuresis and BUN/creatinine is now 61/1.18 -We will follow cardiology recommendations and they are recommending continuing  fluid restriction -Continue with selexipag 600 mcg p.o. twice daily along with macitentan 10 mg p.o. daily -Continue strict I's and O's, daily weights monitoring renal function -We will also fluid restrict to 1500 mL's -Cardiology recommending continue IV Lasix and dose was increased to IV 80 every 12 -She is -13.684 L since admission -Weight is down about 5 pounds since 02/22/2019  -Continue monitor volume status carefully  Acute on chronic COPD -Improving. -Continue steroid, antibiotics and nebulizers.  Will wean steroids from 40 every 6 to 40 every 8h now to every 12h and will keep it this way and likely transition to a p.o. prednisone taper in the next few days -Currently she is on Brovana and budesonide twice daily -Pulmonary recommending continue Incruse and albuterol every 3 times daily as well as as needed every 4 hours for wheezing shortness breath; have adjusted breathing treatments further with the patient on Xopenex and Atrovent scheduled every 6h  -Changed azithromycin to p.o. and have now stopped -Wean oxygen as able.  -Currently remains on 20 L high flow nasal cannula and weaning attempts have failed but will continue diuresis -As above  Suboptimally controlled DM-2 with hyperglycemia, renal complication and peripheral neuropathy.   -CBG's have been elevated and have ranged from 135-234 -Increased Levemir 25 units subcu twice daily to 28 units subcu twice daily as well as resistant NovoLog sliding scale insulin AC and at bedtime along with 8 units units subcu 3 times daily with meals -Recent hemoglobin A1c this morning was 6.9 -Likely blood sugars are worsened in the setting of steroids  CKD-3: Stable And improving -Continue monitoring while on diuretics. -BUN/creatinine is trending down yesterday was 60/1.18; elevated BUN is likely from steroids; today BUN/creatinine was 63/1.34 -Avoid nephrotoxic medications if possible, contrast dyes as well as hypotension-continue to  monitor trend renal function -Repeat CMP in the a.m.  Anemia of chronic disease.   -Anemia panel consistent with this.  H&H stable at 8.9/30.1 -Continue iron polysaccharide 150 mg p.o. twice daily -Need outpatient nephrology follow-up if she has not been contacted yet. -Continue to monitor for signs and symptoms of bleeding; currently no overt bleeding noted -Repeat CBC in the a.m.   History of right ankle fracture status post ORIF in 5/20 Mechanical fall at home  Nonunion of right ankle fracture -Status post debridement removal of hardware, right ankle fusion by Dr. Sharol Given -Management per orthopedic surgery  Hypertension -Continue current cardiac regimen  Orthostatic hypotension -Continue with Midodrine milligrams p.o. 3 times daily  Depression and Anxiety Plan continue with sertraline 75 mg p.o. daily  History of lung cancer status post RLL superior segmentectomy and adjuvant chemotherapy.  Currently on observation. -Outpatient follow-up -Continue Treatment As above -Follow-up with pulmonary in outpatient setting  Obesity -Estimated body mass index is 30.65 kg/m as calculated from the following:   Height as of this encounter: _0  (1.575 m).   Weight as of this encounter: 76 kg. -Weight Loss and Dietary Counseling Given   DVT prophylaxis: Heparin 5,000 sq q8h Code Status: FULL CODE  Family Communication: No family present at bedside Disposition Plan: Pending Primary Clearance; Unfortunately not eligible to go back to LTAC will need to either go home with an IV or will need to be weaned down to 4  to 6 L of oxygen to see if she can go to SNF or have NIV set up for home  Consultants:   [facility]  Cardiology  Pulmonary   Palliative care medicine  Procedures:  02/11/2019-removal of deep hardware right ankle and right ankle fusion by Dr. Sharol Given.   Antimicrobials: Anti-infectives (From admission, onward)   Start     Dose/Rate Route Frequency Ordered Stop   02/25/19 1915   azithromycin (ZITHROMAX) tablet 500 mg  Status:  Discontinued     500 mg Oral Daily 02/25/19 1912 02/27/19 0904   02/21/19 1200  azithromycin (ZITHROMAX) 500 mg in sodium chloride 0.9 % 250 mL IVPB  Status:  Discontinued     500 mg 250 mL/hr over 60 Minutes Intravenous Every 24 hours 02/21/19 1107 02/25/19 1912   02/11/19 0630  vancomycin (VANCOCIN) IVPB 1000 mg/200 mL premix     1,000 mg 200 mL/hr over 60 Minutes Intravenous To Short Stay 02/10/19 0757 02/11/19 0718   02/10/19 0800  vancomycin (VANCOCIN) IVPB 1000 mg/200 mL premix  Status:  Discontinued     1,000 mg 200 mL/hr over 60 Minutes Intravenous To Short Stay 02/10/19 0755 02/10/19 0757     Subjective: Seen and examined at bedside and she is in the chair bedside had no complaints but denies chest pain, lightheadedness or dizziness.  States that she feels good and feels better after her Lasix.  No nausea or vomiting.  No other concerns or complaints at this time but still requiring significant amount of oxygen.  Objective: Vitals:   02/27/19 0943 02/27/19 1100 02/27/19 1347 02/27/19 1621  BP:      Pulse:      Resp:      Temp:  98.7 F (37.1 C)  98.2 F (36.8 C)  TempSrc:  Oral  Oral  SpO2: 92%  92%   Weight:      Height:        Intake/Output Summary (Last 24 hours) at 02/27/2019 1718 Last data filed at 02/27/2019 1620 Gross per 24 hour  Intake 700 ml  Output 1925 ml  Net -1225 ml   Filed Weights   02/25/19 0659 02/26/19 0100 02/27/19 0300  Weight: 76.5 kg 76 kg 76 kg   Examination: Physical Exam:  Constitutional: Well-nourished, well-developed obese Caucasian female currently no acute distress appears calm and comfortable Eyes: Lids and conjunctive are normal.  Sclera anicteric ENMT: External ears and nose appear normal for grossly normal hearing.  Mucous members are moist Neck: Appears supple no JVD Respiratory: Diminished auscultation bilaterally with some coarse breath sounds but no appreciable wheezing,  rales, rhonchi.  Patient not tachypneic or using any accessory muscles to breathe but she is on 20 L of supplemental oxygen via high flow nasal cannula Cardiovascular: Regular rate and rhythm.  No appreciable murmurs, rubs, gallops.  Had 1+ lower extremity edema noted on her left leg Abdomen: Soft, nontender, distended secondary body habitus.  Bowel sounds present GU: Deferred Musculoskeletal: No contractures or cyanosis.  She is wearing a DeRoyal boot on her left foot is wrapped in Ace bandage Skin: Skin is warm and dry no appreciable rashes or lesions on to skin evaluation Neurologic: Skin is warm and dry with no appreciable rashes or lesions on limited skin evaluation Psychiatric: Normal and pleasant mood and affect.  Intact judgment insight.  Patient is awake, alert, oriented  Data Reviewed: I have personally reviewed following labs and imaging studies  CBC: Recent Labs  Lab 02/22/19 0736 02/23/19 0231 02/24/19  1610 02/25/19 0236 02/26/19 0208 02/27/19 0233  WBC 8.5 10.8* 12.3* 12.1* 13.4* 13.0*  NEUTROABS 8.0*  --   --   --  11.4* 10.9*  HGB 8.4* 7.7* 7.6* 7.7* 8.8* 8.9*  HCT 28.3* 25.5* 26.2* 26.3* 29.9* 30.1*  MCV 83.0 82.8 85.1 83.8 83.5 83.4  PLT 224 224 244 258 296 960   Basic Metabolic Panel: Recent Labs  Lab 02/23/19 0231 02/24/19 0325 02/25/19 0236 02/26/19 0208 02/27/19 0233  NA 138 140 139 139 139  K 3.8 3.6 4.1 3.8 4.1  CL 95* 99 101 99 98  CO2 32 _0 GLUCOSE 271* 243* 197* 195* 119*  BUN 61* 57* 60* 61* 63*  CREATININE 1.51* 1.55* 1.38* 1.18* 1.34*  CALCIUM 9.2 9.3 9.3 9.5 9.1  MG  --  2.3 2.4 2.4 2.3  PHOS  --   --   --  3.9 3.6   GFR: Estimated Creatinine Clearance: 36.2 mL/min (A) (by C-G formula based on SCr of 1.34 mg/dL (H)). Liver Function Tests: Recent Labs  Lab 02/26/19 0208 02/27/19 0233  AST 27 33  ALT 32 38  ALKPHOS 142* 136*  BILITOT 0.4 0.6  PROT 6.8 6.0*  ALBUMIN 3.4* 3.2*   No results for input(s): LIPASE, AMYLASE  in the last 168 hours. No results for input(s): AMMONIA in the last 168 hours. Coagulation Profile: No results for input(s): INR, PROTIME in the last 168 hours. Cardiac Enzymes: No results for input(s): CKTOTAL, CKMB, CKMBINDEX, TROPONINI in the last 168 hours. BNP (last 3 results) No results for input(s): PROBNP in the last 8760 hours. HbA1C: Recent Labs    02/25/19 0236  HGBA1C 6.9*   CBG: Recent Labs  Lab 02/26/19 1814 02/26/19 2212 02/27/19 0633 02/27/19 1110 02/27/19 1618  GLUCAP 402* 133* 135* 234* 179*   Lipid Profile: No results for input(s): CHOL, HDL, LDLCALC, TRIG, CHOLHDL, LDLDIRECT in the last 72 hours. Thyroid Function Tests: No results for input(s): TSH, T4TOTAL, FREET4, T3FREE, THYROIDAB in the last 72 hours. Anemia Panel: No results for input(s): VITAMINB12, FOLATE, FERRITIN, TIBC, IRON, RETICCTPCT in the last 72 hours. Sepsis Labs: Recent Labs  Lab 02/24/19 0325  PROCALCITON 0.11    No results found for this or any previous visit (from the past 240 hour(s)).   Radiology Studies: Dg Chest Port 1 View  Result Date: 02/27/2019 CLINICAL DATA:  Shortness of breath EXAM: PORTABLE CHEST 1 VIEW COMPARISON:  Yesterday FINDINGS: Cardiac enlargement that is stable. Diffuse interstitial opacity that is stable. Postoperative right lung volume loss. No Kerley lines, effusion, or pneumothorax. IMPRESSION: 1. Stable chest. 2. Cardiomegaly and chronic lung disease without acute superimposed finding. Electronically Signed   By: Monte Fantasia M.D.   On: 02/27/2019 07:51   Dg Chest Port 1 View  Result Date: 02/26/2019 CLINICAL DATA:  Shortness of breath. EXAM: PORTABLE CHEST 1 VIEW COMPARISON:  02/24/2019 FINDINGS: Stable cardiomegaly. Slightly decreased lung volumes. Hazy densities in the periphery of the right lower chest are nonspecific. Probable old right lower rib fractures. Atherosclerotic calcifications at the aortic arch. No frank pulmonary edema. Postoperative  changes in the right lung. IMPRESSION: Concern for new densities along the periphery of the right lower lung. Findings could be related to overlying shadows or atelectasis but cannot exclude developing airspace disease. Recommend continued follow-up. Electronically Signed   By: Markus Daft M.D.   On: 02/26/2019 09:50   Scheduled Meds:  arformoterol  15 mcg Nebulization BID   atorvastatin  40  mg Oral q1800   azelastine  1 spray Each Nare BID   budesonide (PULMICORT) nebulizer solution  0.5 mg Nebulization BID   docusate sodium  100 mg Oral BID   feeding supplement (GLUCERNA SHAKE)  237 mL Oral BID BM   furosemide  60 mg Oral BID   gabapentin  300 mg Oral TID   guaiFENesin  600 mg Oral BID   heparin  5,000 Units Subcutaneous Q8H   insulin aspart  0-20 Units Subcutaneous TID WC   insulin aspart  0-5 Units Subcutaneous QHS   insulin aspart  8 Units Subcutaneous TID WC   insulin detemir  28 Units Subcutaneous BID   ipratropium  0.5 mg Nebulization Q6H   iron polysaccharides  150 mg Oral BID   levalbuterol  0.63 mg Nebulization Q6H   macitentan  10 mg Oral Daily   mouth rinse  15 mL Mouth Rinse BID   methylPREDNISolone (SOLU-MEDROL) injection  40 mg Intravenous Q12H   midodrine  5 mg Oral TID WC   potassium chloride  20 mEq Oral BID   Selexipag  600 mcg Oral BID   sertraline  75 mg Oral Daily   traMADol  50 mg Oral Q6H   umeclidinium bromide  1 puff Inhalation Daily   vitamin C  250 mg Oral BID   Continuous Infusions:   LOS: 16 days   Kerney Elbe, DO [facility] PAGER is on AMION  If 7PM-7AM, please contact night-coverage [email or web address] Password [facility] 02/27/2019, 5:18 PM

## 2019-02-27 NOTE — Progress Notes (Signed)
  Weight and creatinine stable on current regimen. Weight below previous baseline. Looks euvolemic.   HF team will sign-off. Continue current HF and PAH regimen.   Please call with questions.   Glori Bickers, MD  8:39 AM

## 2019-02-28 ENCOUNTER — Inpatient Hospital Stay (HOSPITAL_COMMUNITY): Payer: Medicare Other

## 2019-02-28 DIAGNOSIS — I270 Primary pulmonary hypertension: Secondary | ICD-10-CM

## 2019-02-28 LAB — GLUCOSE, CAPILLARY
Glucose-Capillary: 192 mg/dL — ABNORMAL HIGH (ref 70–99)
Glucose-Capillary: 194 mg/dL — ABNORMAL HIGH (ref 70–99)
Glucose-Capillary: 73 mg/dL (ref 70–99)
Glucose-Capillary: 266 mg/dL — ABNORMAL HIGH (ref 70–99)

## 2019-02-28 LAB — COMPREHENSIVE METABOLIC PANEL
ALT: 40 U/L (ref 0–44)
AST: 29 U/L (ref 15–41)
Albumin: 3.1 g/dL — ABNORMAL LOW (ref 3.5–5.0)
Alkaline Phosphatase: 122 U/L (ref 38–126)
Anion gap: 10 (ref 5–15)
BUN: 59 mg/dL — ABNORMAL HIGH (ref 8–23)
CO2: 31 mmol/L (ref 22–32)
Calcium: 8.8 mg/dL — ABNORMAL LOW (ref 8.9–10.3)
Chloride: 96 mmol/L — ABNORMAL LOW (ref 98–111)
Creatinine, Ser: 1.3 mg/dL — ABNORMAL HIGH (ref 0.44–1.00)
GFR calc Af Amer: 47 mL/min — ABNORMAL LOW (ref >60)
GFR calc non Af Amer: 41 mL/min — ABNORMAL LOW (ref >60)
Glucose, Bld: 281 mg/dL — ABNORMAL HIGH (ref 70–99)
Potassium: 4 mmol/L (ref 3.5–5.1)
Sodium: 137 mmol/L (ref 135–145)
Total Bilirubin: 0.4 mg/dL (ref 0.3–1.2)
Total Protein: 6 g/dL — ABNORMAL LOW (ref 6.5–8.1)

## 2019-02-28 LAB — MAGNESIUM: Magnesium: 2.3 mg/dL (ref 1.7–2.4)

## 2019-02-28 LAB — CBC WITH DIFFERENTIAL/PLATELET
Abs Immature Granulocytes: 0.17 10*3/uL — ABNORMAL HIGH (ref 0.00–0.07)
Basophils Absolute: 0 10*3/uL (ref 0.0–0.1)
Basophils Relative: 0 %
Eosinophils Absolute: 0 10*3/uL (ref 0.0–0.5)
Eosinophils Relative: 0 %
HCT: 28.6 % — ABNORMAL LOW (ref 36.0–46.0)
Hemoglobin: 8.8 g/dL — ABNORMAL LOW (ref 12.0–15.0)
Immature Granulocytes: 2 %
Lymphocytes Relative: 3 %
Lymphs Abs: 0.3 10*3/uL — ABNORMAL LOW (ref 0.7–4.0)
MCH: 25.4 pg — ABNORMAL LOW (ref 26.0–34.0)
MCHC: 30.8 g/dL (ref 30.0–36.0)
MCV: 82.7 fL (ref 80.0–100.0)
Monocytes Absolute: 0.6 10*3/uL (ref 0.1–1.0)
Monocytes Relative: 5 %
Neutro Abs: 10 10*3/uL — ABNORMAL HIGH (ref 1.7–7.7)
Neutrophils Relative %: 90 %
Platelets: 239 10*3/uL (ref 150–400)
RBC: 3.46 MIL/uL — ABNORMAL LOW (ref 3.87–5.11)
RDW: 19.2 % — ABNORMAL HIGH (ref 11.5–15.5)
WBC: 11 10*3/uL — ABNORMAL HIGH (ref 4.0–10.5)
nRBC: 0.2 % (ref 0.0–0.2)

## 2019-02-28 LAB — PHOSPHORUS: Phosphorus: 3.5 mg/dL (ref 2.5–4.6)

## 2019-02-28 MED ORDER — ADULT MULTIVITAMIN W/MINERALS CH
1.0000 | ORAL_TABLET | Freq: Every day | ORAL | Status: DC
  Administered 2019-03-01 – 2019-03-11 (×11): 1 via ORAL
  Filled 2019-02-28 (×11): qty 1

## 2019-02-28 MED ORDER — FUROSEMIDE 10 MG/ML IJ SOLN
40.0000 mg | Freq: Two times a day (BID) | INTRAMUSCULAR | Status: DC
  Administered 2019-02-28 – 2019-03-10 (×20): 40 mg via INTRAVENOUS
  Filled 2019-02-28 (×20): qty 4

## 2019-02-28 MED ORDER — PREDNISONE 20 MG PO TABS
40.0000 mg | ORAL_TABLET | Freq: Every day | ORAL | Status: AC
  Administered 2019-03-01 – 2019-03-02 (×2): 40 mg via ORAL
  Filled 2019-02-28 (×2): qty 2

## 2019-02-28 NOTE — Progress Notes (Signed)
Patient is tearful; states pulmonary NP was in and told her "there was nothing else that could be done".  Patient states she had hope until that time.  Patient states "not ready to give up".  Encouraged to focus on her breathing and continued treatment.  Will discuss further w/physician.  Patient has remained stable on HFNC at 20L and FiO2 @ 50% today w/sats hanging around 94%.  Patient states no attempt to wean today.  RN monitored wean to 40% and patient able to maintain sats 84-91%.  Will monitor closely   Sats drop if patient is eating or talking.

## 2019-02-28 NOTE — Care Management Important Message (Signed)
Important Message  Patient Details  Name: Amy Shepard MRN: [id number] Date of Birth: 02-Feb-1946   Medicare Important Message Given:  Yes     [name] 02/28/2019, 2:44 PM

## 2019-02-28 NOTE — Progress Notes (Signed)
PROGRESS NOTE    Amy Shepard  GMW:[id number] DOB: [birth year] DOA: 02/11/2019 PCP: Maurice Small, MD   Brief Narrative:  The patient is a 73 year old with diabetes mellitus type 2, essential hypertension, diastolic CHF, chronic respiratory failure, pulmonary nodule, lung cancer, CKD stage III brought to the hospital for elective nonhealing right trimalleolar fracture. Underwent hardware removal and fusion of the ankle but postoperatively developed hypoxia requiring high flow oxygen therefore medical team consulted. Pulmonology was also consulted.  Still remains on 20 L of high flow nasal cannula but states that she is feeling better and renal function is now stable.  Attempts to wean the patient further have been unsuccessful and she is still requiring significant of oxygen so pulmonary was reconsulted today for further evaluation recommendations.  Assessment & Plan:   Active Problems:   Chronic obstructive pulmonary disease ([name])   Type 2 diabetes mellitus with complication, with long-term current use of insulin ([name])   HTN (hypertension)   Acute respiratory failure with hypoxia ([name])   Chronic diastolic CHF (congestive heart failure) ([name])   Pulmonary fibrosis ([name])   Acute on chronic postoperative respiratory failure ([name])   Benign hypertensive heart and kidney disease with diastolic CHF, NYHA class II and CKD stage III ([name])   Charcot ankle, right   Pain from implanted hardware   Closed right ankle fracture, with malunion, subsequent encounter   Right upper lobe pulmonary nodule   Acute on chronic respiratory failure with hypoxia/hypercapnia likely secondary to CHF with pulmonary edema along with underlying pulmonary hypertension, currently stable -Usually On 4 L at home at baseline but requiring 20 L of high flow nasal cannula now and is been very difficult to wean; may need an Oxymizer but will defer this to pulmonary.  She also will likely need an NIV and have consulted  pulmonary for further assistance -Likely a combination of acute on chronic CHF and COPD. -Manage COPD and CHF as below. -Wean oxygen as able; Cotninues to be on 20 liters and attempts to wean have been limited -IV Diuresis per Cardiology Recc's now stopped and was changed to 60 mg p.o. twice daily by Cardiology. -CXR this AM showed "Mild left basilar subsegmental atelectasis or scarring is noted. Aortic Atherosclerosis"  -Continue to wean oxygen as tolerated-continuous pulse oximetry and maintain O2 saturations greater than 88%-94% per pulmonary recommendations -Currently getting azithromycin for COPD exacerbation and this was changed to p.o. and stopped as she got 7 days total -Pulmonary recommending continue bronchodilators and titrating down steroids as they are not sure if they are helping at this point -Continue with pulmonary toilet and as needed BiPAP for increased work of breathing -Continue with IV morphine 0.5 to 1 mg every 2 PRN for severe pain -Repeat chest x-ray in the a.m. -Unfortunately her [facility] denial was upheld by [facility] and she will need to go home with either an IV or be weaned off 4 to 6 L of oxygen to see if she can go to a skilled nursing facility; she still requires significant amount of oxygen and may need an NIV -Since she requires significant amounts oxygen we consulted pulmonary for further evaluation and recommendations but in the interim have have changed some breathing treatments to Xopenex and Atrovent scheduled  -Appreciate further pulmonary recommendations and evaluation  Acute on chronic diastolic CHF/pulmonary hypertension:  -Echo on 8/3 with EF of 55 to 60%, DD and D-shaped interventricular septum.  -BNP elevated to 1400.  Marginal procalcitonin is 0.11 so do not feel  this is infectious. -Appreciate cardiology assistance.   -Improving with Diuretics delineated by cardiology and they are changing back to p.o. diuretics with 60 mg twice daily today -Her Renal  function is improving with diuresis and BUN/creatinine is now 61/1.18 -We will follow cardiology recommendations and they are recommending continuing fluid restriction -Continue with selexipag 600 mcg p.o. twice daily along with macitentan 10 mg p.o. daily -Continue strict I's and O's, daily weights monitoring renal function -We will also fluid restrict to 1500 mL's -Cardiology has changed the diuresis from IV diuretics to 60 mg p.o. twice daily -She is - 14.218 L since admission -Weight is down about 5 pounds since 02/22/2019  -Continue monitor volume status carefully  Acute on chronic COPD -Improving. -Continue steroid, antibiotics and nebulizers.  Will wean steroids from 40 every 6 to 40 every 8h now to every 12h and will keep it this way and likely transition to a p.o. prednisone taper and pulmonary states that we can start the prednisone taper now has been transitioned to 40 mg p.o. daily -Currently she is on Brovana and budesonide twice daily -Pulmonary recommending continue Incruse and albuterol every 3 times daily as well as as needed every 4 hours for wheezing shortness breath; have adjusted breathing treatments further with the patient on Xopenex and Atrovent scheduled every 6h  -Changed azithromycin to p.o. and have now stopped -Wean oxygen as able.  -Currently remains on 20 L high flow nasal cannula and weaning attempts have failed but will continue diuresis; pulmonary feels that her oxygen requirements will be slow to improve -As above  Suboptimally controlled DM-2 with hyperglycemia, renal complication and peripheral neuropathy.   -CBG's have been elevated and have ranged from 73-194 -Increased Levemir 25 units subcu twice daily to 28 units subcu twice daily as well as resistant NovoLog sliding scale insulin AC and at bedtime along with 8 units units subcu 3 times daily with meals -Recent hemoglobin A1c this morning was 6.9 -Likely blood sugars are worsened in the setting of  steroids  CKD-3: Stable And improving -Continue monitoring while on diuretics. -BUN/creatinine is trending down yesterday was 60/1.18; elevated BUN is likely from steroids; today BUN/creatinine was 59/1.30 -Avoid nephrotoxic medications if possible, contrast dyes as well as hypotension-continue to monitor trend renal function -Repeat CMP in the a.m.  Anemia of chronic disease.   -Anemia panel consistent with this.  H&H stable at 8.8/28.6 -Continue iron polysaccharide 150 mg p.o. twice daily -Need outpatient nephrology follow-up if she has not been contacted yet. -Continue to monitor for signs and symptoms of bleeding; currently no overt bleeding noted -Repeat CBC in the a.m.   History of right ankle fracture status post ORIF in 5/20 Mechanical fall at home  Nonunion of right ankle fracture -Status post debridement removal of hardware, right ankle fusion by Dr. Sharol Given -Management per orthopedic surgery  Hypertension -Continue current cardiac regimen  Orthostatic hypotension -Continue with Midodrine milligrams p.o. 3 times daily  Depression and Anxiety Plan continue with sertraline 75 mg p.o. daily  History of lung cancer status post RLL superior segmentectomy and adjuvant chemotherapy.  Currently on observation. -Outpatient follow-up -Continue Treatment As above -Follow-up with pulmonary in outpatient setting  Obesity -Estimated body mass index is 30.69 kg/m as calculated from the following:   Height as of this encounter: _0  (1.575 m).   Weight as of this encounter: 76.1 kg. -Weight Loss and Dietary Counseling Given   DVT prophylaxis: Heparin 5,000 sq q8h Code Status: FULL CODE  Family Communication: No family present at bedside Disposition Plan:   Consultants:   [facility]  Cardiology  Pulmonary   Palliative care medicine  Procedures:  02/11/2019-removal of deep hardware right ankle and right ankle fusion by Dr. Sharol Given.   Antimicrobials: Anti-infectives (From  admission, onward)   Start     Dose/Rate Route Frequency Ordered Stop   02/25/19 1915  azithromycin (ZITHROMAX) tablet 500 mg  Status:  Discontinued     500 mg Oral Daily 02/25/19 1912 02/27/19 0904   02/21/19 1200  azithromycin (ZITHROMAX) 500 mg in sodium chloride 0.9 % 250 mL IVPB  Status:  Discontinued     500 mg 250 mL/hr over 60 Minutes Intravenous Every 24 hours 02/21/19 1107 02/25/19 1912   02/11/19 0630  vancomycin (VANCOCIN) IVPB 1000 mg/200 mL premix     1,000 mg 200 mL/hr over 60 Minutes Intravenous To Short Stay 02/10/19 0757 02/11/19 0718   02/10/19 0800  vancomycin (VANCOCIN) IVPB 1000 mg/200 mL premix  Status:  Discontinued     1,000 mg 200 mL/hr over 60 Minutes Intravenous To Short Stay 02/10/19 0755 02/10/19 0757     Subjective: Seen and examined at bedside and sitting in the chair and was little frustrated that she can get weaned.  No nausea or vomiting and said that she feels good.  No other concerns or complaints at this time but oxygen saturations keep intermittently dropping.  Objective: Vitals:   02/28/19 1300 02/28/19 1400 02/28/19 1457 02/28/19 1620  BP:    (!) 108/53  Pulse:   73   Resp:   (!) 22 (!) 25  Temp:    98.3 F (36.8 C)  TempSrc:    Oral  SpO2: 91% (!) 88% (!) 89%   Weight:      Height:        Intake/Output Summary (Last 24 hours) at 02/28/2019 1652 Last data filed at 02/28/2019 3474 Gross per 24 hour  Intake 436 ml  Output 850 ml  Net -414 ml   Filed Weights   02/26/19 0100 02/27/19 0300 02/28/19 0323  Weight: 76 kg 76 kg 76.1 kg   Examination: Physical Exam:  Constitutional: Well-nourished, well-developed obese Caucasian female currently no acute distress sitting up in bed Eyes: There is a conjunctive are normal.  Sclera anicteric ENMT: External ears and nose appear normal.  Grossly normal hearing.  Mucous membranes are more Neck: Appears supple no JVD Respiratory: Diminished auscultation bilaterally with coarse breath sounds but  no appreciable wheezing, rales, rhonchi.  Patient not tachypneic wheezing and accessory muscles to breathe and still continues to remain on 20 L of high flow nasal cannula Cardiovascular: Regular rate and rhythm.  No appreciable murmurs, rubs, gallops.  Has 1+ lower extremity edema on the left leg Abdomen: Soft, nontender, distended secondary to body habitus.  Bowel sounds present GU: Deferred Musculoskeletal: No contractures or cyanosis.  No joint deformities and no longer wearing the dural boot but her leg is still wrapped in an Ace bandage Skin: Skin is warm and dry no appreciable rashes or lesions on to skin evaluation Neurologic: Cranial nerves II through XII gross intact no appreciable focal deficits Psychiatric: Slightly anxious but has a pleasant mood and affect.  Intact judgment insight.  Patient is awake, alert, oriented  Data Reviewed: I have personally reviewed following labs and imaging studies  CBC: Recent Labs  Lab 02/22/19 0736  02/24/19 0325 02/25/19 0236 02/26/19 0208 02/27/19 0233 02/28/19 0234  WBC 8.5   < > 12.3*  12.1* 13.4* 13.0* 11.0*  NEUTROABS 8.0*  --   --   --  11.4* 10.9* 10.0*  HGB 8.4*   < > 7.6* 7.7* 8.8* 8.9* 8.8*  HCT 28.3*   < > 26.2* 26.3* 29.9* 30.1* 28.6*  MCV 83.0   < > 85.1 83.8 83.5 83.4 82.7  PLT 224   < > 244 258 296 302 239   < > = values in this interval not displayed.   Basic Metabolic Panel: Recent Labs  Lab 02/24/19 0325 02/25/19 0236 02/26/19 0208 02/27/19 0233 02/28/19 0234  NA 140 139 139 139 137  K 3.6 4.1 3.8 4.1 4.0  CL 99 101 99 98 96*  CO2 _0 GLUCOSE 243* 197* 195* 119* 281*  BUN 57* 60* 61* 63* 59*  CREATININE 1.55* 1.38* 1.18* 1.34* 1.30*  CALCIUM 9.3 9.3 9.5 9.1 8.8*  MG 2.3 2.4 2.4 2.3 2.3  PHOS  --   --  3.9 3.6 3.5   GFR: Estimated Creatinine Clearance: 37.4 mL/min (A) (by C-G formula based on SCr of 1.3 mg/dL (H)). Liver Function Tests: Recent Labs  Lab 02/26/19 0208 02/27/19 0233  02/28/19 0234  AST 27 33 29  ALT 32 38 40  ALKPHOS 142* 136* 122  BILITOT 0.4 0.6 0.4  PROT 6.8 6.0* 6.0*  ALBUMIN 3.4* 3.2* 3.1*   No results for input(s): LIPASE, AMYLASE in the last 168 hours. No results for input(s): AMMONIA in the last 168 hours. Coagulation Profile: No results for input(s): INR, PROTIME in the last 168 hours. Cardiac Enzymes: No results for input(s): CKTOTAL, CKMB, CKMBINDEX, TROPONINI in the last 168 hours. BNP (last 3 results) No results for input(s): PROBNP in the last 8760 hours. HbA1C: No results for input(s): HGBA1C in the last 72 hours. CBG: Recent Labs  Lab 02/27/19 1618 02/27/19 2128 02/28/19 0620 02/28/19 1114 02/28/19 1623  GLUCAP 179* 239* 192* 194* 73   Lipid Profile: No results for input(s): CHOL, HDL, LDLCALC, TRIG, CHOLHDL, LDLDIRECT in the last 72 hours. Thyroid Function Tests: No results for input(s): TSH, T4TOTAL, FREET4, T3FREE, THYROIDAB in the last 72 hours. Anemia Panel: No results for input(s): VITAMINB12, FOLATE, FERRITIN, TIBC, IRON, RETICCTPCT in the last 72 hours. Sepsis Labs: Recent Labs  Lab 02/24/19 0325  PROCALCITON 0.11    No results found for this or any previous visit (from the past 240 hour(s)).   Radiology Studies: Dg Chest Port 1 View  Result Date: 02/28/2019 CLINICAL DATA:  Cough, shortness of breath. EXAM: PORTABLE CHEST 1 VIEW COMPARISON:  Radiographs of February 27, 2019. FINDINGS: Stable cardiomediastinal silhouette. Atherosclerosis of thoracic aorta is noted. No pneumothorax is noted. Right lung is clear. Mild left basilar opacity is noted laterally suggesting scarring or subsegmental atelectasis. Bony thorax is unremarkable. IMPRESSION: Mild left basilar subsegmental atelectasis or scarring is noted. Aortic Atherosclerosis (ICD10-I70.0). Electronically Signed   By: Marijo Conception M.D.   On: 02/28/2019 08:07   Dg Chest Port 1 View  Result Date: 02/27/2019 CLINICAL DATA:  Shortness of breath EXAM:  PORTABLE CHEST 1 VIEW COMPARISON:  Yesterday FINDINGS: Cardiac enlargement that is stable. Diffuse interstitial opacity that is stable. Postoperative right lung volume loss. No Kerley lines, effusion, or pneumothorax. IMPRESSION: 1. Stable chest. 2. Cardiomegaly and chronic lung disease without acute superimposed finding. Electronically Signed   By: Monte Fantasia M.D.   On: 02/27/2019 07:51   Scheduled Meds:  arformoterol  15 mcg Nebulization BID   atorvastatin  40  mg Oral q1800   azelastine  1 spray Each Nare BID   budesonide (PULMICORT) nebulizer solution  0.5 mg Nebulization BID   docusate sodium  100 mg Oral BID   furosemide  40 mg Intravenous Q12H   gabapentin  300 mg Oral TID   guaiFENesin  600 mg Oral BID   heparin  5,000 Units Subcutaneous Q8H   insulin aspart  0-20 Units Subcutaneous TID WC   insulin aspart  0-5 Units Subcutaneous QHS   insulin aspart  8 Units Subcutaneous TID WC   insulin detemir  28 Units Subcutaneous BID   ipratropium  0.5 mg Nebulization Q6H   iron polysaccharides  150 mg Oral BID   levalbuterol  0.63 mg Nebulization Q6H   macitentan  10 mg Oral Daily   mouth rinse  15 mL Mouth Rinse BID   midodrine  5 mg Oral TID WC   [START ON 03/01/2019] multivitamin with minerals  1 tablet Oral Daily   potassium chloride  20 mEq Oral BID   [START ON 03/01/2019] predniSONE  40 mg Oral Q breakfast   Selexipag  600 mcg Oral BID   sertraline  75 mg Oral Daily   traMADol  50 mg Oral Q6H   umeclidinium bromide  1 puff Inhalation Daily   vitamin C  250 mg Oral BID   Continuous Infusions:   LOS: 17 days   Kerney Elbe, DO [facility] PAGER is on AMION  If 7PM-7AM, please contact night-coverage [email or web address] Password [facility] 02/28/2019, 4:52 PM

## 2019-02-28 NOTE — Progress Notes (Signed)
Inpatient Diabetes Program Recommendations  AACE/ADA: New Consensus Statement on Inpatient Glycemic Control   Target Ranges:  Prepandial:   less than 140 mg/dL      Peak postprandial:   less than 180 mg/dL (1-2 hours)      Critically ill patients:  140 - 180 mg/dL  Results for ZYKIRA, MATLACK (MRN [id number]) as of 02/28/2019 09:38  Ref. Range 02/27/2019 06:33 02/27/2019 11:10 02/27/2019 16:18 02/27/2019 21:28 02/28/2019 06:20  Glucose-Capillary Latest Ref Range: 70 - 99 mg/dL 135 (H) 234 (H) 179 (H) 239 (H) 192 (H)    Review of Glycemic Control  Current orders for Inpatient glycemic control: Levemir 28 units BID, Novolog 8 units TID with meals for meal coverage, Novolog 0-20 units TID with meals, Novolog 0-5 units QHS; Solumedrol 40 mg Q12H  Inpatient Diabetes Program Recommendations:   Insulin-meal coverage: If steroids are continued, please consider increasing meal coverage to Novolog 12 units TID with meals.  Thanks, Barnie Alderman, RN, MSN, CDE Diabetes Coordinator Inpatient Diabetes Program [phone] (Team Pager from 8am to 5pm)

## 2019-02-28 NOTE — Progress Notes (Signed)
Physical Therapy Treatment Patient Details Name: Amy Shepard MRN: [id number] DOB: 04/20/1946 Today's Date: 02/28/2019    History of Present Illness 73 yo 10wks s/p trimalleolar Rt ankle fx with syndesmotic disruption. 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 7/31 hardware removal with ankle fusion, wound VAC. PMHx: lung CA, CAD, spinal fusion, depression, CHF, pulmonary HTN, CKD    PT Comments    Patient progressing to OOB this session, remains hypoxic even with little activity OOB despite maintained on HFNC.  Continues to be appropriate for SNF level rehab.  No c/o pain with OOB transfer.  Goals updated this session.  PT to follow.    Follow Up Recommendations  SNF     Equipment Recommendations  None recommended by PT    Recommendations for Other Services       Precautions / Restrictions Precautions Precautions: Fall Precaution Comments: watch O2 Required Braces or Orthoses: Other Brace Other Brace: CAM boot RLE Restrictions LLE Weight Bearing: Weight bearing as tolerated Other Position/Activity Restrictions: WBAT for transfer only with CAM boot    Mobility  Bed Mobility         Supine to sit: HOB elevated;Supervision     General bed mobility comments: assist for lines  Transfers Overall transfer level: Needs assistance Equipment used: 1 person hand held assist Transfers: Sit to/from Stand;Stand Pivot Transfers Sit to Stand: Min guard Stand pivot transfers: Min assist       General transfer comment: assist to pivot with lines and HHA to recliner  Ambulation/Gait             General Gait Details: unable per orders   Stairs             Wheelchair Mobility    Modified Rankin (Stroke Patients Only)       Balance Overall balance assessment: Needs assistance   Sitting balance-Leahy Scale: Good     Standing balance support: Bilateral upper extremity supported;During functional activity Standing  balance-Leahy Scale: Poor                              Cognition Arousal/Alertness: Awake/alert Behavior During Therapy: WFL for tasks assessed/performed Overall Cognitive Status: Within Functional Limits for tasks assessed                                        Exercises General Exercises - Lower Extremity Long Arc Quad: AROM;Both;Seated;15 reps    General Comments General comments (skin integrity, edema, etc.): SpO2 drops to 66% with OOB to chair on 20L HFNC; slowly climbs back to 85% in 2 minutes      Pertinent Vitals/Pain Faces Pain Scale: No hurt    Home Living                      Prior Function            PT Goals (current goals can now be found in the care plan section) Acute Rehab PT Goals Patient Stated Goal: to get strong enough to get home  PT Goal Formulation: With patient Time For Goal Achievement: 03/14/19 Potential to Achieve Goals: Good Progress towards PT goals: Progressing toward goals;Goals met and updated - see care plan    Frequency    Min 2X/week      PT Plan Current plan remains appropriate      Co-evaluation              AM-PAC PT "6 Clicks" Mobility   Outcome Measure  Help needed turning from your back to your side while in a flat bed without using bedrails?: A Little Help needed moving from lying on your back to sitting on the side of a flat bed without using bedrails?: None Help needed moving to and from a bed to a chair (including a wheelchair)?: A Little Help needed standing up from a chair using your arms (e.g., wheelchair or bedside chair)?: A Little Help needed to walk in hospital room?: Total Help needed climbing 3-5 steps with a railing? : Total 6 Click Score: 15    End of Session Equipment Utilized During Treatment: Oxygen;Other (comment)(comboot) Activity Tolerance: Treatment limited secondary to medical complications (Comment)(hypoxic with mobility OOB to chair on HFNC) Patient  left: in chair;with call bell/phone within reach Nurse Communication: Other (comment)(patient ready for bath) PT Visit Diagnosis: Muscle weakness (generalized) (M62.81);Other abnormalities of gait and mobility (R26.89)     Time: 1535-1556 PT Time Calculation (min) (ACUTE ONLY): 21 min  Charges:  $Therapeutic Activity: 8-22 mins                     Cyndi Shepard, PT Acute Rehabilitation Services [phone] 02/28/2019    Amy Shepard 02/28/2019, 5:03 PM   

## 2019-02-28 NOTE — Plan of Care (Signed)

## 2019-02-28 NOTE — Plan of Care (Signed)
  Problem: Education: Goal: Knowledge of General Education information will improve Description: Including pain rating scale, medication(s)/side effects and non-pharmacologic comfort measures Outcome: Adequate for Discharge   Problem: Health Behavior/Discharge Planning: Goal: Ability to manage health-related needs will improve Outcome: Progressing   Problem: Clinical Measurements: Goal: Ability to maintain clinical measurements within normal limits will improve Outcome: Progressing Goal: Will remain free from infection Outcome: Progressing Goal: Diagnostic test results will improve Outcome: Progressing Goal: Respiratory complications will improve Outcome: Progressing Goal: Cardiovascular complication will be avoided Outcome: Progressing   Problem: Activity: Goal: Risk for activity intolerance will decrease Outcome: Progressing   Problem: Nutrition: Goal: Adequate nutrition will be maintained Outcome: Adequate for Discharge   Problem: Coping: Goal: Level of anxiety will decrease Outcome: Progressing   Problem: Elimination: Goal: Will not experience complications related to bowel motility Outcome: Adequate for Discharge Goal: Will not experience complications related to urinary retention Outcome: Progressing   Problem: Pain Managment: Goal: General experience of comfort will improve Outcome: Adequate for Discharge   Problem: Safety: Goal: Ability to remain free from injury will improve Outcome: Adequate for Discharge   Problem: Skin Integrity: Goal: Risk for impaired skin integrity will decrease Outcome: Progressing

## 2019-02-28 NOTE — Progress Notes (Signed)
Nutrition Follow-up  RD working remotely.  DOCUMENTATION CODES:   Obesity unspecified  INTERVENTION:   - d/c Glucerna Shake as pt is refusing  - MVI with minerals  - Encourage continued adequate PO intake  NUTRITION DIAGNOSIS:   Increased nutrient needs related to post-op healing, chronic illness as evidenced by estimated needs.  Ongoing  GOAL:   Patient will meet greater than or equal to 90% of their needs  Progressing  MONITOR:   PO intake, Supplement acceptance, I & O's, Weight trends, Labs, Skin  REASON FOR ASSESSMENT:   LOS    ASSESSMENT:   73 year old female who presented on 7/31 with closed fracture of right ankle with malunion, failure of fixation. Pt presented for removal of right ankle hardware and fusion of the right ankle. Wound VAC placed. 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  8/07 - VAC removed  Pt continues to require high flow nasal cannula. Attempts to wean have been limited.  Pt is now on a 1500 ml fluid restriction.  Weight stable over the last several days, down a total of 5 lbs since first measured weight on 8/11. Per RN edema assessment, pt with mild pitting edema to RLE and moderate pitting edema to LLE.  Unable to reach pt via phone call to room. PO intake good with most meals 100% completed. Per MAR, pt refusing 100% of Glucerna Shakes. RD will d/c. Will order a daily MVI with minerals.  Meal Completion: 80-100% x last 8 recorded meals  Medications reviewed and include: Colace, Glucerna BID, Lasix 60 mg BID, SSI, Novolog 8 units TID with meals, Levemir 28 units BID, Niferex, Solu-medrol, K-dur 20 mEq BID, vitamin C 250 mg BID  Labs reviewed: hemoglobin 8.8 CBG's: 179-239 x 24 hours  UOP: 2275 ml x 24 hours I/O's: -13.1 L since admit  NUTRITION - FOCUSED PHYSICAL EXAM:  Unable to complete at this  time. RD working remotely.  Diet Order:   Diet Order            Diet Carb Modified Fluid consistency: Thin; Room service appropriate? Yes; Fluid restriction: 1500 mL Fluid  Diet effective now              EDUCATION NEEDS:   No education needs have been identified at this time  Skin:  Skin Assessment: Skin Integrity Issues: Incisions: right ankle  Last BM:  02/27/19  Height:   Ht Readings from Last 1 Encounters:  02/11/19 _0  (1.575 m)    Weight:   Wt Readings from Last 1 Encounters:  02/28/19 76.1 kg    Ideal Body Weight:  50 kg  BMI:  Body mass index is 30.69 kg/m.  Estimated Nutritional Needs:   Kcal:  1600-1800  Protein:  75-90 grams  Fluid:  1.6 L    Gaynell Face, MS, RD, LDN Inpatient Clinical Dietitian Pager: [phone] Weekend/After Hours: [phone]

## 2019-02-28 NOTE — Progress Notes (Signed)
NAME:  Amy Shepard, MRN:  [id number], DOB:  03-23-1946, LOS: 74 ADMISSION DATE:  02/11/2019, CONSULTATION DATE:  02/21/2019 REFERRING MD:  Dr. Sloan Leiter, CHIEF COMPLAINT:  Hypoxia  Brief History   73 year old female with chronic hypoxic respiratory failure admitted 7/31 for elective repair of nonhealing right trimalleolar ankle fracture s/p hardware removal and fusion on 7/31 by Dr. Sharol Given, being medically managed by [facility].  Post-operatively has had increased and ongoing oxygenation requirements despite optimized medical therapy with IV steroid taper and diuresis and remains on heated high flow unable to wean.  PCCM consulted for further pulmonary recommendations.   Past Medical History  Chronic hypoxic respiratory failure on home oxygen 4L, COPD, CAD, prior non-small cell lung cancer in January 2010 s/p right lower lobe superior segmentectomy with LN dissection and chemotherapy with carboplatin and etopiside, moderate pulmonary hypertension, chronic diastolic heart failure, HTN, HLD, DM, CKD stage III, anemia of chronic disease, right charcot ankle fracture  She is followed by Dr. Aundra Dubin for HF/ pulmonary hypertension  Significant Hospital Events   7/31 Admitted / OR  8/10 PCCM consult.  Add abx, steroids, changed nebs, diuresis  8/11 Afebrile, less wheeze, HFNC 100% / 30L.  Refused BiPAP overnight. Net neg 4.8.  8/12 HFNC 25L, 65% FiO2 8/14 HFNC 20L, 55% FIO2, increased to 60%  8/17 HFNC 20L 50% FiO2   Consults:  Ortho [facility]   Procedures:  7/31 OR for Charcot Collapse Right Ankle with Failure Fixation  Significant Diagnostic Tests:  8/3 TTE >> EF 55 to 14%, mod RV systolic dysfx with D shaped intraventricular septum, mild AS, PAS 88 mmHg  Micro Data:  8/2 SARS CoV-2 >> negative 7/31 right ankle tissue cx >> neg  Antimicrobials:  7/30 vanc OR 8/10 azithro >>  Interim history/subjective:  Patient states she is feeling okay. She denies SOB and says she is having a  hard time sleeping.    Objective   Blood pressure 129/63, pulse 77, temperature 98.1 F (36.7 C), temperature source Oral, resp. rate 18, height _0  (1.575 m), weight 76.1 kg, SpO2 96 %.    FiO2 (%):  [50 %] 50 %   Intake/Output Summary (Last 24 hours) at 02/28/2019 1130 Last data filed at 02/28/2019 0333 Gross per 24 hour  Intake 896 ml  Output 1650 ml  Net -754 ml   Filed Weights   02/26/19 0100 02/27/19 0300 02/28/19 0323  Weight: 76 kg 76 kg 76.1 kg   Examination:  General: elderly, obese female. Reclined in bed NAD   HEENT: NCAT. Patent nares with 20L HFNC in place. Trachea midline  Neuro: AAOx4 no focal deficits  CV: RRR s1s2 no rgm  Lungs:  Diminished bibasilar sounds. Symmetrical chest expansion. No accessory muscle use.  ABD: obese, soft, round, ndnt. Normoactive  Extremities: no obvious deformity, no cyanosis   Skin: clean, dry, warm, without rash  Resolved Hospital Problem list     Assessment & Plan:   Acute on chronic hypoxic, hypercapnic respiratory failure -baseline 4L O2  -Continues on 20L here P: Continue to attempt O2 wean for SpO2 goal 88-94% PRN BiPAP for increased WOB Continue IS, Flutter, Pulm hygiene, mobility, PT   COPD Exacerbation with h/o RLD and Lung Ca s/p resection  -quit smoking 2008, up to 1.5ppd prior to that -s/p course of azithromycin for AECOPD P: Continue brovana + pumicort BID  Continue Incruse, Alubterol, xopenex Steroids weaned from 40 q8 to q12. Agree with upcoming transition to PO taper  Acute on chronic cor pulmonale -RHC 07/2017 with moderate PAH with PVR 4.7 WU, preserved cardiac output, near normal filling pressures.  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  Has  been managed by CHF team as an outpt for pulmonary hypertension medications  Etiology: consider autoimmune evaluation > pt has sister, brother with the same.  Sister was a smoker brother was not.  Father died of the same.  Not sure that there is any value in this at this point [facility]  P: Heart failure has signed off Continue opsumit, uptravi  Hx of NSCLC s/p RLL superior segment resection in 2010, and chemotherapy in 2010. P: No acute interventions at this time   Iron deficiency anemia P: Continue Niferex + Vit C per primary      Goals of Care and disposition:  PCCM reiterated per previous d/w our team that given her lung disease and pulmonary hypertension, would recommend against invasive support as intubation/MV would likely cause hemodynamic instability if not cardiac collapse.    Palliative has been consulted however patient is not ready to discuss code status  Disposition continues to be challenging. [facility] is denied at [facility] Not eligible for SNF unless O2 can wean to 4-6 L[place], AGACNP-BC [phone] no answer, [phone]/17/2020, 11:31 AM

## 2019-02-28 NOTE — Progress Notes (Signed)
PCCM Brief Interval note  Discussed with patient's RN. RN states patient is tearful and states PCCM NP has informed patient "nothing else could be done."  This was not said; I have clarified conversation with RN and will again speak with patient.    Eliseo Gum MSN, AGACNP-BC [place] [phone] If no answer, [phone] 02/28/2019, 2:48 PM

## 2019-03-01 LAB — GLUCOSE, CAPILLARY
Glucose-Capillary: 118 mg/dL — ABNORMAL HIGH (ref 70–99)
Glucose-Capillary: 281 mg/dL — ABNORMAL HIGH (ref 70–99)
Glucose-Capillary: 367 mg/dL — ABNORMAL HIGH (ref 70–99)
Glucose-Capillary: 393 mg/dL — ABNORMAL HIGH (ref 70–99)

## 2019-03-01 LAB — CBC WITH DIFFERENTIAL/PLATELET
Abs Immature Granulocytes: 0.12 10*3/uL — ABNORMAL HIGH (ref 0.00–0.07)
Basophils Absolute: 0 10*3/uL (ref 0.0–0.1)
Basophils Relative: 0 %
Eosinophils Absolute: 0.3 10*3/uL (ref 0.0–0.5)
Eosinophils Relative: 2 %
HCT: 32 % — ABNORMAL LOW (ref 36.0–46.0)
Hemoglobin: 9.6 g/dL — ABNORMAL LOW (ref 12.0–15.0)
Immature Granulocytes: 1 %
Lymphocytes Relative: 5 %
Lymphs Abs: 0.6 10*3/uL — ABNORMAL LOW (ref 0.7–4.0)
MCH: 24.8 pg — ABNORMAL LOW (ref 26.0–34.0)
MCHC: 30 g/dL (ref 30.0–36.0)
MCV: 82.7 fL (ref 80.0–100.0)
Monocytes Absolute: 0.8 10*3/uL (ref 0.1–1.0)
Monocytes Relative: 6 %
Neutro Abs: 11 10*3/uL — ABNORMAL HIGH (ref 1.7–7.7)
Neutrophils Relative %: 86 %
Platelets: 249 10*3/uL (ref 150–400)
RBC: 3.87 MIL/uL (ref 3.87–5.11)
RDW: 19.7 % — ABNORMAL HIGH (ref 11.5–15.5)
WBC: 12.9 10*3/uL — ABNORMAL HIGH (ref 4.0–10.5)
nRBC: 0 % (ref 0.0–0.2)

## 2019-03-01 LAB — BASIC METABOLIC PANEL
Anion gap: 10 (ref 5–15)
BUN: 61 mg/dL — ABNORMAL HIGH (ref 8–23)
CO2: 30 mmol/L (ref 22–32)
Calcium: 8.8 mg/dL — ABNORMAL LOW (ref 8.9–10.3)
Chloride: 96 mmol/L — ABNORMAL LOW (ref 98–111)
Creatinine, Ser: 1.36 mg/dL — ABNORMAL HIGH (ref 0.44–1.00)
GFR calc Af Amer: 45 mL/min — ABNORMAL LOW (ref >60)
GFR calc non Af Amer: 39 mL/min — ABNORMAL LOW (ref >60)
Glucose, Bld: 261 mg/dL — ABNORMAL HIGH (ref 70–99)
Potassium: 3.9 mmol/L (ref 3.5–5.1)
Sodium: 136 mmol/L (ref 135–145)

## 2019-03-01 LAB — MAGNESIUM: Magnesium: 2.3 mg/dL (ref 1.7–2.4)

## 2019-03-01 LAB — PHOSPHORUS: Phosphorus: 4.3 mg/dL (ref 2.5–4.6)

## 2019-03-01 NOTE — TOC Progression Note (Signed)
Transition of Care [facility]) - Progression Note    Patient Details  Name: Amy Shepard MRN: [id number] Date of Birth: [birth year]  [facility] CM/SW Contact  Eileen Stanford, LCSW Phone Number: 03/01/2019, 2:25 PM  Clinical Narrative:    Continuing to follow for SNF placement once oxygen is weaned.   Expected Discharge Plan: Long Term Acute Care (LTAC) Barriers to Discharge: No Barriers Identified  Expected Discharge Plan and Services Expected Discharge Plan: Long Term Acute Care (LTAC) In-house Referral: NA Discharge Planning Services: CM Consult Post Acute Care Choice: Long Term Acute Care (LTAC) Living arrangements for the past 2 months: Single Family Home                 DME Arranged: (NA)         HH Arranged: NA           Social Determinants of Health (SDOH) Interventions    Readmission Risk Interventions Readmission Risk Prevention Plan 11/29/2018  Transportation Screening Complete  PCP or Specialist Appt within 3-5 Days Complete  HRI or [place] Complete  Social Work Consult for [place] Planning/Counseling Complete  Palliative Care Screening Not Applicable  Medication Review Press photographer) Complete  Some recent data might be hidden

## 2019-03-01 NOTE — Progress Notes (Signed)
Patient ID: Amy Shepard, female   DOB: 08/25/1945, 73 y.o.   MRN: [id number] Patient comfortable this morning without complaints.  O2 sats in the high 90s.

## 2019-03-01 NOTE — Plan of Care (Signed)
  Problem: Education: Goal: Knowledge of General Education information will improve Description: Including pain rating scale, medication(s)/side effects and non-pharmacologic comfort measures Outcome: Progressing

## 2019-03-01 NOTE — Progress Notes (Signed)
PROGRESS NOTE    Amy Shepard  ZOX:[id number] DOB: Dec 09, 1945 DOA: 02/11/2019 PCP: Maurice Small, MD   Brief Narrative:  The patient is a 73 year old with diabetes mellitus type 2, essential hypertension, diastolic CHF, chronic respiratory failure, pulmonary nodule, lung cancer, CKD stage III brought to the hospital for elective nonhealing right trimalleolar fracture. Underwent hardware removal and fusion of the ankle but postoperatively developed hypoxia requiring high flow oxygen therefore medical team consulted. Pulmonology was also consulted.  Still remains on 20 L of high flow nasal cannula but states that she is feeling better and renal function is now stable.  Attempts to wean the patient further have been unsuccessful and she is still requiring significant of oxygen so Pulmonary was reconsulted today for further evaluation recommendations.  FiO2 is weaning and patient still remains on 20 L but she was dropped to 40% by pulmonary.  Pulmonary recommending continue IV Lasix 40 mg every 12 and tapering prednisone 10 mg every 2 days.  Assessment & Plan:   Active Problems:   Chronic obstructive pulmonary disease ([name])   Type 2 diabetes mellitus with complication, with long-term current use of insulin ([name])   HTN (hypertension)   Acute respiratory failure with hypoxia ([name])   Chronic diastolic CHF (congestive heart failure) ([name])   Pulmonary fibrosis ([name])   Acute on chronic postoperative respiratory failure ([name])   Benign hypertensive heart and kidney disease with diastolic CHF, NYHA class II and CKD stage III ([name])   Charcot ankle, right   Pain from implanted hardware   Closed right ankle fracture, with malunion, subsequent encounter   Right upper lobe pulmonary nodule   Acute on chronic respiratory failure with hypoxia/hypercapnia likely secondary to CHF with pulmonary edema along with underlying pulmonary hypertension, currently stable -Usually On 4 L at home at baseline but  requiring 20 L of high flow nasal cannula now and is been very difficult to wean; may need an Oxymizer but will defer this to pulmonary.  She also will likely need an NIV and have consulted pulmonary for further assistance -Likely a combination of acute on chronic CHF and COPD. -Manage COPD and CHF as below. -Wean oxygen as able; Cotninues to be on 20 liters and attempts to wean have been limited however she was able to be dropped from 50% FiO2 to 40% today by pulmonary -IV Diuresis per Cardiology Recc's now stopped and was changed to 60 mg p.o. twice daily by Cardiology however was changed back to IV 40 every 12 by pulmonary currently -CXR yesterday AM showed "Mild left basilar subsegmental atelectasis or scarring is noted. Aortic Atherosclerosis"  -Continue to wean oxygen as tolerated-continuous pulse oximetry and maintain O2 saturations greater than 88%-94% per pulmonary recommendations -Currently getting azithromycin for COPD exacerbation and this was changed to p.o. and stopped as she got 7 days total -Pulmonary recommending continue bronchodilators and titrating down steroids as they are not sure if they are helping at this point -Continue with pulmonary toilet and as needed BiPAP for increased work of breathing -Continue with IV morphine 0.5 to 1 mg every 2 PRN for severe pain -Repeat chest x-ray in the a.m. -Unfortunately her [facility] denial was upheld by [facility] and she will need to go home with either an IV or be weaned off 4 to 6 L of oxygen to see if she can go to a skilled nursing facility; she still requires significant amount of oxygen and may need an NIV -Since she requires significant amounts oxygen we consulted  pulmonary for further evaluation and recommendations but in the interim have have changed some breathing treatments to Xopenex and Atrovent scheduled  -Appreciate further pulmonary recommendations and evaluation  Acute on chronic diastolic CHF/pulmonary hypertension:  -Echo on  8/3 with EF of 55 to 60%, DD and D-shaped interventricular septum.  -BNP elevated to 1400.  Marginal procalcitonin is 0.11 so do not feel this is infectious. -Appreciate cardiology assistance.   -Improving with Diuretics delineated by cardiology and pulmonary; she is changed to p.o. 60 mg twice daily by cardiology however but back to IV 40 every 12 by pulmonary -Her Renal function is improving with diuresis and BUN/creatinine is now 61/1.18 -We will follow cardiology recommendations and they are recommending continuing fluid restriction -Continue with selexipag 600 mcg p.o. twice daily along with macitentan 10 mg p.o. daily -Continue strict I's and O's, daily weights monitoring renal function -We will also fluid restrict to 1500 mL's -She is -14.933 L since admission -Weight is down about 5 pounds since 02/22/2019  -Continue monitor volume status carefully  Acute on chronic COPD -Improving. -Continue steroid, antibiotics and nebulizers.  Will wean steroids from 40 every 6 to 40 every 8h now to every 12h and will keep it this way and likely transition to a p.o. prednisone taper and pulmonary states that we can start the prednisone taper now has been transitioned to 40 mg p.o. daily and pulmonary recommending continuing 40 mg p.o. daily and tapering 10 mg every 2 days -Currently she is on Brovana and budesonide twice daily -Pulmonary recommending continue Incruse and albuterol every 3 times daily as well as as needed every 4 hours for wheezing shortness breath; have adjusted breathing treatments further with the patient on Xopenex and Atrovent scheduled every 6h  -Changed azithromycin to p.o. and have now stopped -Wean oxygen as able.  -Currently remains on 20 L high flow nasal cannula and weaning attempts have failed but will continue diuresis; pulmonary feels that her oxygen requirements will be slow to improve -As above  Suboptimally controlled DM-2 with hyperglycemia, renal complication and  peripheral neuropathy.   -CBG's have been elevated and have ranged from 118-367 -Increased Levemir 25 units subcu twice daily to 28 units subcu twice daily as well as resistant NovoLog sliding scale insulin AC and at bedtime along with 8 units units subcu 3 times daily with meals -Recent hemoglobin A1c this morning was 6.9 -Likely blood sugars are worsened in the setting of steroids will need to be continually adjusted  CKD-3: Stable And improving -Continue monitoring while on diuretics. -BUN/creatinine is trending down and trended down to 60/1.18 at his lowest; elevated BUN is likely from steroids; today BUN/creatinine was 61/1.36 -Avoid nephrotoxic medications if possible, contrast dyes as well as hypotension-continue to monitor trend renal function -Repeat CMP in the a.m.  Anemia of chronic disease.   -Anemia panel consistent with this. H&H stable at 9.6/32.0 -Continue iron polysaccharide 150 mg p.o. twice daily -Need outpatient nephrology follow-up if she has not been contacted yet. -Continue to monitor for signs and symptoms of bleeding; currently no overt bleeding noted -Repeat CBC in the a.m.   History of right ankle fracture status post ORIF in 5/20 Mechanical fall at home  Nonunion of right ankle fracture -Status post debridement removal of hardware, right ankle fusion by Dr. Sharol Given -Management per orthopedic surgery  Hypertension -Continue current cardiac regimen  Orthostatic hypotension -Continue with Midodrine milligrams p.o. 3 times daily  Depression and Anxiety Plan continue with sertraline 75 mg p.o.  daily  History of lung cancer status post RLL superior segmentectomy and adjuvant chemotherapy.  Currently on observation. -Outpatient follow-up -Continue Treatment As above -Follow-up with pulmonary in outpatient setting  Obesity -Estimated body mass index is 30.6 kg/m as calculated from the following:   Height as of this encounter: _0  (1.575 m).   Weight  as of this encounter: 75.9 kg. -Weight Loss and Dietary Counseling Given   DVT prophylaxis: Heparin 5,000 sq q8h Code Status: FULL CODE  Family Communication: No family present at bedside Disposition Plan: Pending; needs to either go on 4 to 6 L to SNF or may need to go home with an IV but will continue weaning efforts  Consultants:   [facility]  Cardiology  Pulmonary   Palliative care medicine  Procedures:  02/11/2019-removal of deep hardware right ankle and right ankle fusion by Dr. Sharol Given.   Antimicrobials: Anti-infectives (From admission, onward)   Start     Dose/Rate Route Frequency Ordered Stop   02/25/19 1915  azithromycin (ZITHROMAX) tablet 500 mg  Status:  Discontinued     500 mg Oral Daily 02/25/19 1912 02/27/19 0904   02/21/19 1200  azithromycin (ZITHROMAX) 500 mg in sodium chloride 0.9 % 250 mL IVPB  Status:  Discontinued     500 mg 250 mL/hr over 60 Minutes Intravenous Every 24 hours 02/21/19 1107 02/25/19 1912   02/11/19 0630  vancomycin (VANCOCIN) IVPB 1000 mg/200 mL premix     1,000 mg 200 mL/hr over 60 Minutes Intravenous To Short Stay 02/10/19 0757 02/11/19 0718   02/10/19 0800  vancomycin (VANCOCIN) IVPB 1000 mg/200 mL premix  Status:  Discontinued     1,000 mg 200 mL/hr over 60 Minutes Intravenous To Short Stay 02/10/19 0755 02/10/19 0757     Subjective: Seen and examined at bedside and states that she was breathing good.  Was able to be titrated down to 40% FiO2.  Denies any chest pain, lightheadedness or dizziness.  No nausea or vomiting but states that her legs are still swollen.  No other concerns or complaints this time.  Objective: Vitals:   03/01/19 1600 03/01/19 1602 03/01/19 1927 03/01/19 1931  BP: (!) 111/57  (!) 125/59 (!) 125/59  Pulse: 74  75 76  Resp: _1 Temp:  98.1 F (36.7 C) 98.2 F (36.8 C)   TempSrc:  Oral Oral   SpO2: 90%  90% (!) 89%  Weight:      Height:        Intake/Output Summary (Last 24 hours) at 03/01/2019 1947 Last  data filed at 03/01/2019 1811 Gross per 24 hour  Intake 700 ml  Output 2375 ml  Net -1675 ml   Filed Weights   02/27/19 0300 02/28/19 0323 03/01/19 0630  Weight: 76 kg 76.1 kg 75.9 kg   Examination: Physical Exam:  Constitutional: Well-nourished, well-developed obese Caucasian female currently no acute distress sitting up in bed Eyes: Lids and conjunctive are normal.  Sclera anicteric ENMT: External ears and nose appear normal.  Grossly normal hearing Neck: Appears supple with no JVD Respiratory: Diminished auscultation bilaterally with coarse breath sounds but no appreciable wheezing, rales, rhonchi.  Patient not tachypneic or using any accessory muscles to breathe Cardiovascular: Regular rate and rhythm.  No appreciable murmurs, rubs or gallops but does have 1+ lower extremity edema noted Abdomen: Soft, nontender, distended secondary to body habitus.  Bowel sounds GU: Deferred Musculoskeletal: No contractures or cyanosis.  No joint deformities in the upper lower extremities but  she is wearing a Ace bandage around her right ankle Skin: Skin is warm and dry no appreciable rashes or lesions on to skin evaluation Neurologic: Cranial nerves II through XII grossly intact no appreciable focal deficit Psychiatric: Patient is awake, alert, oriented.  Has a pleasant mood and affect.  Intact judgment and insight  Data Reviewed: I have personally reviewed following labs and imaging studies  CBC: Recent Labs  Lab 02/25/19 0236 02/26/19 0208 02/27/19 0233 02/28/19 0234 03/01/19 0841  WBC 12.1* 13.4* 13.0* 11.0* 12.9*  NEUTROABS  --  11.4* 10.9* 10.0* 11.0*  HGB 7.7* 8.8* 8.9* 8.8* 9.6*  HCT 26.3* 29.9* 30.1* 28.6* 32.0*  MCV 83.8 83.5 83.4 82.7 82.7  PLT 258 296 302 239 696   Basic Metabolic Panel: Recent Labs  Lab 02/25/19 0236 02/26/19 0208 02/27/19 0233 02/28/19 0234 03/01/19 0237 03/01/19 0841  NA 139 139 139 137 136  --   K 4.1 3.8 4.1 4.0 3.9  --   CL 101 99 98 96* 96*   --   CO2 _0 --   GLUCOSE 197* 195* 119* 281* 261*  --   BUN 60* 61* 63* 59* 61*  --   CREATININE 1.38* 1.18* 1.34* 1.30* 1.36*  --   CALCIUM 9.3 9.5 9.1 8.8* 8.8*  --   MG 2.4 2.4 2.3 2.3  --  2.3  PHOS  --  3.9 3.6 3.5  --  4.3   GFR: Estimated Creatinine Clearance: 35.7 mL/min (A) (by C-G formula based on SCr of 1.36 mg/dL (H)). Liver Function Tests: Recent Labs  Lab 02/26/19 0208 02/27/19 0233 02/28/19 0234  AST 27 33 29  ALT 32 38 40  ALKPHOS 142* 136* 122  BILITOT 0.4 0.6 0.4  PROT 6.8 6.0* 6.0*  ALBUMIN 3.4* 3.2* 3.1*   No results for input(s): LIPASE, AMYLASE in the last 168 hours. No results for input(s): AMMONIA in the last 168 hours. Coagulation Profile: No results for input(s): INR, PROTIME in the last 168 hours. Cardiac Enzymes: No results for input(s): CKTOTAL, CKMB, CKMBINDEX, TROPONINI in the last 168 hours. BNP (last 3 results) No results for input(s): PROBNP in the last 8760 hours. HbA1C: No results for input(s): HGBA1C in the last 72 hours. CBG: Recent Labs  Lab 02/28/19 1623 02/28/19 2122 03/01/19 0628 03/01/19 1126 03/01/19 1600  GLUCAP 73 266* 118* 281* 367*   Lipid Profile: No results for input(s): CHOL, HDL, LDLCALC, TRIG, CHOLHDL, LDLDIRECT in the last 72 hours. Thyroid Function Tests: No results for input(s): TSH, T4TOTAL, FREET4, T3FREE, THYROIDAB in the last 72 hours. Anemia Panel: No results for input(s): VITAMINB12, FOLATE, FERRITIN, TIBC, IRON, RETICCTPCT in the last 72 hours. Sepsis Labs: Recent Labs  Lab 02/24/19 0325  PROCALCITON 0.11    No results found for this or any previous visit (from the past 240 hour(s)).   Radiology Studies: Dg Chest Port 1 View  Result Date: 02/28/2019 CLINICAL DATA:  Cough, shortness of breath. EXAM: PORTABLE CHEST 1 VIEW COMPARISON:  Radiographs of February 27, 2019. FINDINGS: Stable cardiomediastinal silhouette. Atherosclerosis of thoracic aorta is noted. No pneumothorax is noted.  Right lung is clear. Mild left basilar opacity is noted laterally suggesting scarring or subsegmental atelectasis. Bony thorax is unremarkable. IMPRESSION: Mild left basilar subsegmental atelectasis or scarring is noted. Aortic Atherosclerosis (ICD10-I70.0). Electronically Signed   By: Marijo Conception M.D.   On: 02/28/2019 08:07   Scheduled Meds: . arformoterol  15 mcg Nebulization BID  .  atorvastatin  40 mg Oral q1800  . azelastine  1 spray Each Nare BID  . budesonide (PULMICORT) nebulizer solution  0.5 mg Nebulization BID  . docusate sodium  100 mg Oral BID  . furosemide  40 mg Intravenous Q12H  . gabapentin  300 mg Oral TID  . guaiFENesin  600 mg Oral BID  . heparin  5,000 Units Subcutaneous Q8H  . insulin aspart  0-20 Units Subcutaneous TID WC  . insulin aspart  0-5 Units Subcutaneous QHS  . insulin aspart  8 Units Subcutaneous TID WC  . insulin detemir  28 Units Subcutaneous BID  . ipratropium  0.5 mg Nebulization Q6H  . iron polysaccharides  150 mg Oral BID  . levalbuterol  0.63 mg Nebulization Q6H  . macitentan  10 mg Oral Daily  . mouth rinse  15 mL Mouth Rinse BID  . midodrine  5 mg Oral TID WC  . multivitamin with minerals  1 tablet Oral Daily  . potassium chloride  20 mEq Oral BID  . predniSONE  40 mg Oral Q breakfast  . Selexipag  600 mcg Oral BID  . sertraline  75 mg Oral Daily  . traMADol  50 mg Oral Q6H  . umeclidinium bromide  1 puff Inhalation Daily  . vitamin C  250 mg Oral BID   Continuous Infusions:   LOS: 18 days   Kerney Elbe, DO [facility] PAGER is on AMION  If 7PM-7AM, please contact night-coverage [email or web address] Password [facility] 03/01/2019, 7:47 PM

## 2019-03-01 NOTE — Plan of Care (Signed)

## 2019-03-01 NOTE — Progress Notes (Signed)
NAME:  Amy Shepard, MRN:  [id number], DOB:  [birth year], LOS: 83 ADMISSION DATE:  02/11/2019, CONSULTATION DATE:  02/21/2019 REFERRING MD:  Dr. Sloan Leiter, CHIEF COMPLAINT:  Hypoxia  Brief History   73 year old female with chronic hypoxic respiratory failure admitted 7/31 for elective repair of nonhealing right trimalleolar ankle fracture s/p hardware removal and fusion on 7/31 by Dr. Sharol Given, being medically managed by [facility].  Post-operatively has had increased and ongoing oxygenation requirements despite optimized medical therapy with IV steroid taper and diuresis and remains on heated high flow unable to wean.  PCCM consulted for further pulmonary recommendations.   Past Medical History  Chronic hypoxic respiratory failure on home oxygen 4L, COPD, CAD, prior non-small cell lung cancer in January 2010 s/p right lower lobe superior segmentectomy with LN dissection and chemotherapy with carboplatin and etopiside, moderate pulmonary hypertension, chronic diastolic heart failure, HTN, HLD, DM, CKD stage III, anemia of chronic disease, right charcot ankle fracture  She is followed by Dr. Aundra Dubin for HF/ pulmonary hypertension  Significant Hospital Events   7/31 Admitted / OR  8/10 PCCM consult.  Add abx, steroids, changed nebs, diuresis  8/11 Afebrile, less wheeze, HFNC 100% / 30L.  Refused BiPAP overnight. Net neg 4.8.  8/12 HFNC 25L, 65% FiO2 8/14 HFNC 20L, 55% FIO2, increased to 60%  8/17 HFNC 20L 50% FiO2   Consults:  [facility] Cardiology   Procedures:  7/31 OR for Charcot Collapse Right Ankle with Failure Fixation  Significant Diagnostic Tests:  8/3 TTE >> EF 55 to 25%, mod RV systolic dysfx with D shaped intraventricular septum, mild AS, PAS 88 mmHg  Micro Data:  8/2 SARS CoV-2 >> negative 7/31 right ankle tissue cx >> neg  Antimicrobials:  7/30 vanc OR 8/10 azithro >>  Interim history/subjective:  Feels somewhat improved; though, still requiring HFNC. SpO2 mid 90s.   Objective   Blood pressure 112/61, pulse 66, temperature 98.8 F (37.1 C), temperature source Oral, resp. rate 13, height _0  (1.575 m), weight 75.9 kg, SpO2 93 %.    FiO2 (%):  [40 %-50 %] 50 %   Intake/Output Summary (Last 24 hours) at 03/01/2019 1001 Last data filed at 03/01/2019 0727 Gross per 24 hour  Intake -  Output 1600 ml  Net -1600 ml   Filed Weights   02/27/19 0300 02/28/19 0323 03/01/19 0630  Weight: 76 kg 76.1 kg 75.9 kg   Examination:  General: elderly, obese female. Reclined in bed watching TV.  In NAD   HEENT: NCAT. EOMI.  MMM Neuro: AAOx4 no focal deficits  CV: RRR Lungs:  CTAB ABD: obese, soft, round, ndnt. Normoactive  Extremities: no obvious deformity, no cyanosis   Skin: clean, dry, warm, without rash  Assessment & Plan:   Acute on chronic hypoxic, hypercapnic respiratory failure (Baseline 4L O2) P: Continue to attempt O2 wean for SpO2 goal 88-94%, O2 needs will be slow to improve PRN BiPAP for increased WOB Continue IS, Flutter, Pulm hygiene, mobility, PT - have encouraged pt to push IS use and mobilize with PT as much as able  COPD Exacerbation with h/o RLD and Lung Ca s/p resection  -quit smoking 2008, up to 1.5ppd prior to that -s/p course of azithromycin for AECOPD P: Continue brovana + pumicort BID  Continue Incruse, Alubterol, xopenex Continue pred taper (started 8/18)  Acute on chronic cor pulmonale -RHC 07/2017 with moderate PAH with PVR 4.7 WU, preserved cardiac output, near normal filling pressures.  -on Opsumit, Selexipag P:  Heart Failure has signed off, they are in agreement with current PAH and HF regimen and are available for re-engagement as needed PO Lasix 6m BID Continue to follow I/O's / daily weights Continue midodrine   Pulmonary hypertension. 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  Has been managed by CHF team as an outpt for pulmonary hypertension medications  Etiology: consider autoimmune evaluation > pt has sister, brother with  the same.  Sister was a smoker brother was not.  Father died of the same.  Not sure that there is any value in this at this point as [facility]  P: Heart failure has signed off  Continue opsumit, uptravi  Hx of NSCLC s/p RLL superior segment resection in 2010, and chemotherapy in 2010. P: No acute interventions at this time    Goals of Care and disposition: PCCM reiterated per previous d/w our team that given her lung disease and pulmonary hypertension, would recommend against invasive support as intubation/MV would likely cause hemodynamic instability if not cardiac collapse.    Palliative has been consulted however patient is not ready to discuss code status  Disposition continues to be challenging. Patient is denied at [facility] Not eligible for SNF unless O2 can wean to 4-6 L   RMontey Hora PA - [facility] Pager: [phone]  If no answer, [phone] - [id number]/18/2020, 10:29 AM

## 2019-03-02 ENCOUNTER — Inpatient Hospital Stay (HOSPITAL_COMMUNITY): Payer: Medicare Other

## 2019-03-02 DIAGNOSIS — Z515 Encounter for palliative care: Secondary | ICD-10-CM

## 2019-03-02 DIAGNOSIS — Z7189 Other specified counseling: Secondary | ICD-10-CM

## 2019-03-02 DIAGNOSIS — R0902 Hypoxemia: Secondary | ICD-10-CM

## 2019-03-02 DIAGNOSIS — I5032 Chronic diastolic (congestive) heart failure: Secondary | ICD-10-CM

## 2019-03-02 LAB — CBC WITH DIFFERENTIAL/PLATELET
Abs Immature Granulocytes: 0.09 10*3/uL — ABNORMAL HIGH (ref 0.00–0.07)
Basophils Absolute: 0 10*3/uL (ref 0.0–0.1)
Basophils Relative: 0 %
Eosinophils Absolute: 0.2 10*3/uL (ref 0.0–0.5)
Eosinophils Relative: 2 %
HCT: 29.1 % — ABNORMAL LOW (ref 36.0–46.0)
Hemoglobin: 9 g/dL — ABNORMAL LOW (ref 12.0–15.0)
Immature Granulocytes: 1 %
Lymphocytes Relative: 6 %
Lymphs Abs: 0.7 10*3/uL (ref 0.7–4.0)
MCH: 25.4 pg — ABNORMAL LOW (ref 26.0–34.0)
MCHC: 30.9 g/dL (ref 30.0–36.0)
MCV: 82.2 fL (ref 80.0–100.0)
Monocytes Absolute: 0.9 10*3/uL (ref 0.1–1.0)
Monocytes Relative: 8 %
Neutro Abs: 9.4 10*3/uL — ABNORMAL HIGH (ref 1.7–7.7)
Neutrophils Relative %: 83 %
Platelets: 218 10*3/uL (ref 150–400)
RBC: 3.54 MIL/uL — ABNORMAL LOW (ref 3.87–5.11)
RDW: 19.5 % — ABNORMAL HIGH (ref 11.5–15.5)
WBC: 11.3 10*3/uL — ABNORMAL HIGH (ref 4.0–10.5)
nRBC: 0 % (ref 0.0–0.2)

## 2019-03-02 LAB — MAGNESIUM: Magnesium: 2.3 mg/dL (ref 1.7–2.4)

## 2019-03-02 LAB — PHOSPHORUS: Phosphorus: 4 mg/dL (ref 2.5–4.6)

## 2019-03-02 LAB — GLUCOSE, CAPILLARY
Glucose-Capillary: 98 mg/dL (ref 70–99)
Glucose-Capillary: 154 mg/dL — ABNORMAL HIGH (ref 70–99)
Glucose-Capillary: 241 mg/dL — ABNORMAL HIGH (ref 70–99)
Glucose-Capillary: 192 mg/dL — ABNORMAL HIGH (ref 70–99)

## 2019-03-02 LAB — COMPREHENSIVE METABOLIC PANEL
ALT: 36 U/L (ref 0–44)
AST: 23 U/L (ref 15–41)
Albumin: 3.1 g/dL — ABNORMAL LOW (ref 3.5–5.0)
Alkaline Phosphatase: 106 U/L (ref 38–126)
Anion gap: 10 (ref 5–15)
BUN: 58 mg/dL — ABNORMAL HIGH (ref 8–23)
CO2: 29 mmol/L (ref 22–32)
Calcium: 8.8 mg/dL — ABNORMAL LOW (ref 8.9–10.3)
Chloride: 99 mmol/L (ref 98–111)
Creatinine, Ser: 1.24 mg/dL — ABNORMAL HIGH (ref 0.44–1.00)
GFR calc Af Amer: 50 mL/min — ABNORMAL LOW (ref >60)
GFR calc non Af Amer: 43 mL/min — ABNORMAL LOW (ref >60)
Glucose, Bld: 226 mg/dL — ABNORMAL HIGH (ref 70–99)
Potassium: 3.9 mmol/L (ref 3.5–5.1)
Sodium: 138 mmol/L (ref 135–145)
Total Bilirubin: 0.4 mg/dL (ref 0.3–1.2)
Total Protein: 5.7 g/dL — ABNORMAL LOW (ref 6.5–8.1)

## 2019-03-02 MED ORDER — PREDNISONE 20 MG PO TABS
30.0000 mg | ORAL_TABLET | Freq: Every day | ORAL | Status: AC
  Administered 2019-03-03 – 2019-03-04 (×2): 30 mg via ORAL
  Filled 2019-03-02 (×2): qty 1

## 2019-03-02 MED ORDER — LEVALBUTEROL HCL 0.63 MG/3ML IN NEBU
0.6300 mg | INHALATION_SOLUTION | Freq: Three times a day (TID) | RESPIRATORY_TRACT | Status: DC
  Administered 2019-03-02 – 2019-03-11 (×27): 0.63 mg via RESPIRATORY_TRACT
  Filled 2019-03-02 (×28): qty 3

## 2019-03-02 MED ORDER — PREDNISONE 20 MG PO TABS
20.0000 mg | ORAL_TABLET | Freq: Every day | ORAL | Status: AC
  Administered 2019-03-05 – 2019-03-06 (×2): 20 mg via ORAL
  Filled 2019-03-02 (×2): qty 1

## 2019-03-02 MED ORDER — IPRATROPIUM BROMIDE 0.02 % IN SOLN
0.5000 mg | Freq: Three times a day (TID) | RESPIRATORY_TRACT | Status: DC
  Administered 2019-03-02 – 2019-03-11 (×27): 0.5 mg via RESPIRATORY_TRACT
  Filled 2019-03-02 (×28): qty 2.5

## 2019-03-02 MED ORDER — PREDNISONE 10 MG PO TABS
10.0000 mg | ORAL_TABLET | Freq: Every day | ORAL | Status: AC
  Administered 2019-03-07 – 2019-03-08 (×2): 10 mg via ORAL
  Filled 2019-03-02 (×2): qty 1

## 2019-03-02 NOTE — Progress Notes (Signed)
Patient ID: Amy Shepard, female   DOB: 08/28/1945, 73 y.o.   MRN: [id number]   This nurse practitioner reviewed medical records, received report from team, assessed the patient and then met at the patient's bedside as follow-up to previous goals of care meeting had on 02-26-19.    This 73 y.o. female with multiple medical problems including chronic diastolic congestive heart failure, pulmonary hypertension, lung cancer, chronic kidney disease stage III, diabetes,COPD (home oxygen 3-4L/[place]), hypertension,  and a recent complicated right ankle fracture.   She presented for removal of right ankle hardware and fusion. She recently underwent ORIF of right ankle which developed progressive Charcot collapse. She has been wearing a fracture boot with retained hardware. Patient developed postoperative respiratory failure requiring BiPAP and HFNC.   Today is day 67 of hospitalization and patient continues to require high flow oxygen secondary to worsening pulmonary hypertension, acute cor pulmonale.  Patient faces decisions regarding treatment options, advanced directives, and anticipatory care needs.  Continued conversation had regarding current medical situation; diagnosis, prognosis, treatment options, goals of care, disposition and options.  We discussed concept specific to human mortality and the limitations of medical interventions when the body begins to fail to thrive.  Plan of care: -No chest compressions, cardioversion, or intubation -Continue all other medical interventions to prolong life, patient is hopeful for improvement and ultimately to return home -Hopeful for LTAC for ongoing treatments   We discussed the what if's; if all viable treatment options fail to provide the outcome she is hopeful for.  This was difficult for her to even comprehend.  I did introduce the concept of hospice benefit.  Discussed with patient the importance of continued conversation with her  medical  providers regarding overall plan of care and treatment options,  ensuring decisions are within the context of the patients values and GOCs.  Questions and concerns addressed   Discussed with Dr Lisbeth Ply  This nurse practitioner informed  the patient and the attending that I will be out of the hospital until Monday morning.  If the patient is still hospitalized I will follow-up at that time.  Call palliative medicine team phone # [phone] with questions or concerns, or palliative needs.  Total time spent on the unit was 60 minutes  Greater than 50% of the time was spent in counseling and coordination of care      Wadie Lessen NP  Palliative Medicine Team Team Phone # [phone] Pager [phone]

## 2019-03-02 NOTE — Progress Notes (Signed)
Occupational Therapy Treatment Patient Details Name: Amy Shepard MRN: [id number] DOB: 1946/06/26 Today's Date: 03/02/2019    History of present illness 73 yo 10wks s/p trimalleolar Rt ankle fx with syndesmotic disruption. 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 7/31 hardware removal with ankle fusion, wound VAC. PMHx: lung CA, CAD, spinal fusion, depression, CHF, pulmonary HTN, CKD   OT comments  Patient seated in recliner and agreeable to OT.  Declined engagement in ADLs today, focused on theraband HEP for UE strengthening (reports completing AROM strengthening exercise program previously provided approx 1-2x/day).  Patient utilized level 1 yellow band to complete program listed below, cueing throughout session for PLB and required forced rest breaks between sets.  Poor awareness to O2 saturation levels, maintained 88-93% on 20L HFNC at 40% FiO2, with 1 drop to 70% during minimal exercising (recovered with PLB and rest).  Plan for [facility] techniques next session and further ADL engagement.     Follow Up Recommendations  SNF    Equipment Recommendations  3 in 1 bedside commode    Recommendations for Other Services      Precautions / Restrictions Precautions Precautions: Fall Precaution Comments: watch O2 Required Braces or Orthoses: Other Brace Other Brace: CAM boot RLE Restrictions Weight Bearing Restrictions: Yes RUE Weight Bearing: Weight bearing as tolerated LUE Weight Bearing: Weight bearing as tolerated RLE Weight Bearing: Weight bearing as tolerated RLE Partial Weight Bearing Percentage or Pounds: body LLE Weight Bearing: Weight bearing as tolerated Other Position/Activity Restrictions: WBAT for transfer only with CAM boot       Mobility Bed Mobility               General bed mobility comments: OOB in recliner upon entry   Transfers                      Balance Overall balance assessment: Needs assistance   Sitting  balance-Leahy Scale: Good                                     ADL either performed or assessed with clinical judgement   ADL                                         General ADL Comments: pt seated in recliner upon entry, declined engagement in ADLs today reporting "I have an appointment with my tech at 11". Session focused on breathing techniques and initation of HEP using theraband     Vision       Perception     Praxis      Cognition Arousal/Alertness: Awake/alert Behavior During Therapy: WFL for tasks assessed/performed Overall Cognitive Status: Within Functional Limits for tasks assessed                                          Exercises Exercises: General Upper Extremity;Other exercises General Exercises - Upper Extremity Shoulder Flexion: Strengthening;Both;10 reps;Seated;Theraband Theraband Level (Shoulder Flexion): Level 1 (Yellow) Elbow Flexion: Strengthening;10 reps;Both;Seated;Theraband Theraband Level (Elbow Flexion): Level 1 (Yellow) Other Exercises Other Exercises: strengthening; both 10 reps seated forward press level 1 (yellow) theraband    Shoulder Instructions       General Comments Pt on  HFNC 20L 40% FiO2; saturated between 88-93%, requires cueing for PLB and forced rest breaks (pt unaware when her O2 levels drop); poor carryover of PLB throughout session, sats dropped to 70% once during exercises but recovered within 30 seconds given cueing for PLB and forced rest break    Pertinent Vitals/ Pain       Pain Assessment: No/denies pain  Home Living                                          Prior Functioning/Environment              Frequency  Min 2X/week        Progress Toward Goals  OT Goals(current goals can now be found in the care plan section)  Progress towards OT goals: Progressing toward goals  Acute Rehab OT Goals Patient Stated Goal: to get strong enough to  get home  OT Goal Formulation: With patient ADL Goals Pt Will Transfer to Toilet: with modified independence;stand pivot transfer;bedside commode  Plan Discharge plan remains appropriate;Frequency remains appropriate    Co-evaluation                 AM-PAC OT "6 Clicks" Daily Activity     Outcome Measure   Help from another person eating meals?: None Help from another person taking care of personal grooming?: A Little Help from another person toileting, which includes using toliet, bedpan, or urinal?: A Lot Help from another person bathing (including washing, rinsing, drying)?: A Little Help from another person to put on and taking off regular upper body clothing?: A Little Help from another person to put on and taking off regular lower body clothing?: A Lot 6 Click Score: 17    End of Session Equipment Utilized During Treatment: Oxygen  OT Visit Diagnosis: Other abnormalities of gait and mobility (R26.89);Pain   Activity Tolerance Patient tolerated treatment well   Patient Left in chair;with call bell/phone within reach   Nurse Communication Mobility status;Other (comment)(HEP)        Time: 2549-8264 OT Time Calculation (min): 16 min  Charges: OT General Charges $OT Visit: 1 Visit OT Treatments $Therapeutic Exercise: 8-22 mins  Delight Stare, [place] Pager [phone] Office [phone]    Delight Stare 03/02/2019, 10:46 AM

## 2019-03-02 NOTE — Progress Notes (Addendum)
Patient in bed with 02 at 20L on 40%, desaturation of 60s noted. Breathing exercises done with recovery of 70s. Pursed lip breathing educated.  Patient mental status intact. Answers questions appropriately. No apparent distress. Patient is mouth breathing. Educated to breathe in nose and breathe out pursed lipped. Saturations recovery very slowly also using arcupella and incentive spirometer. Patient denies difficulty breathing. Will continue to observe. MD order goal is 26 or greater.  25- Saturations have been holding above 80% on 20L /45% adjusted by respiratory. No distress noted. Continue to work on pursed lip breathing when indicated.

## 2019-03-02 NOTE — Progress Notes (Signed)
[facility]  PROGRESS NOTE  Amy Shepard FFM:[id number] DOB: 10/09/1945 DOA: 02/11/2019 PCP: Maurice Small, MD  Brief History    Amy Shepard is a 73 y.o. year old female with medical history significant for chronic hypoxic respiratory failure secondary to COPD/moderate pulmonary hypertension on oxygen 4 L, CAD, prior non-small cell lung cancer in January 2010 s/p right lower lobe superior segmentectomy, hypertension, chronic diastolic heart failure, HTN, HLD, DM, CKD stage III, anemia of chronic disease, right charcot ankle fracture  who presented on 02/11/2019 with planned elective repair of right trimalleolar fracture by Dr. Sharol Given.  Patient underwent hardware removal and fusion of ankle but postoperatively developed worsening hypoxia.  Patient currently has acute on chronic hypoxic respiratory failure requiring up to 20 L of high flow nasal cannula to maintain normal oxygen saturations.  PCCM is followingDue to concern that this is related to worsening pulmonary hypertension/acute cor pulmonale.  Palliative care is also been consulted to assist with goals of care given poor prognosis  A & P   Acute on chronic respiratory failure, continues to require 20 L high flow nasal cannula to maintain normal oxygen saturations, no overt increased WOB/respiratory distress.  Severe pulmonary hypertension at baseline typically requiring 4 L here requiring upwards of 20 L of high flow nasal cannula to maintain normal oxygen saturations greater than 92%. Her goal Spo2 is 85% per PCCM Suspect have addition of acute on chronic CHF/COPD flare/poor reserve related to pulmonary hypertension with resultant cor pulmonale.  Pulmonary consultants assisting with oxygenation weaning, wean prednisone, continue IV Lasix 40 mg twice daily, incentive spirometry, flutter valve.  Palliative care has been consulted given her lung disease will likely not do well with any intubation per pulmonary recommendations  Acute  on chronic cor pulmonale.  Has moderate pulmonary arterial hypertension with preserved cardiac output based off most recent right heart cath.  Continue home Opsumit, Selexipag.  Continue IV Lasix 40 mg twice daily given persistent O2 requirementMonitor daily weights, (-, 1.8 L 24 hours, wgt stable)  Nonhealing right trimalleolar ankle fracture status post hardware removal and fusion on 7/31 by Dr. Sharol Given  Acute on chronic COPD exacerbation.  No overt wheezing heard on exam.  Continue scheduled inhalers.  Undergoing current prednisone taper down 2mlligrams decrease every 2 days until off.  Type 2 diabetes with hyperglycemia, no, patient peripheral neuropathy.  A1c 6.9.  Hyperglycemia worsened in setting of concurrent steroid use, slowly tapering off will expect improvement with that, continue Levemir 28 units twice daily, sliding scale as needed, scheduled short-acting 3 times daily with meals  Anemia of chronic disease (CKD), hemoglobin stable.  Transfuse for hemoglobin less than 7 no active signs of bleeding.  CKD stage III.  1.2-1.6.  Creatinine currently stable.  Continue to monitor output, avoid nephrotoxins.  Orthostatic hypotension, continue home midodrine  Depression, stable continue Zoloft   DVT prophylaxis: Heparin Code Status: Partial) change 8/19 after discussion with plan of care) Family Communication: No family at bedside Disposition Plan: Still requiring 20 L high flow nasal cannula to maintain appropriate oxygen saturation, hopeful to wean O2, treating with continue IV Lasix diuresis.     [facility] Direct contact: see [email or web address] (further directions at bottom of note if needed) 7PM-7AM contact night coverage as at bottom of note 03/02/2019, 8:06 AM  LOS: 19 days   Consultants  . AHF, PCCM, Palliative  Procedures  02/11/2019-removal of deep hardware right ankle and right ankle fusion by Dr. DSharol Given  Antibiotics  .  Interval History/Subjective  Understand her  breathing overall is not well and wants all of her doctors to "shoot straight with her".  Denies any chest pain.  Feels like her breathing is stable today  Objective   Vitals:  Vitals:   03/02/19 0320 03/02/19 0755  BP: (!) 100/55   Pulse: 68   Resp: 13   Temp: 97.9 F (36.6 C)   SpO2: 90% 90%    Exam:  Awake Alert, Oriented X 3, No new F.N deficits, Normal affect Sentinel.AT,PERRAL Supple Neck,No JVD appreciated,   Symmetrical Chest wall movement, Good air movement bilaterally, CTAB, able to speak in complete sentences maintaining O2 sats greater than 85% on 20 L high flow nasal cannula RRR,No Gallops,Rubs or new Murmurs, No Parasternal Heave +ve B.Sounds, Abd Soft, No tenderness, No organomegaly appriciated, No rebound - guarding or rigidity. No Cyanosis, Clubbing or edema, No new Rash or bruise    I have personally reviewed the following:   Data Reviewed: Basic Metabolic Panel: Recent Labs  Lab 02/26/19 0208 02/27/19 0233 02/28/19 0234 03/01/19 0237 03/01/19 0841 03/02/19 0213  NA 139 139 137 136  --  138  K 3.8 4.1 4.0 3.9  --  3.9  CL 99 98 96* 96*  --  99  CO2 _0 --  29  GLUCOSE 195* 119* 281* 261*  --  226*  BUN 61* 63* 59* 61*  --  58*  CREATININE 1.18* 1.34* 1.30* 1.36*  --  1.24*  CALCIUM 9.5 9.1 8.8* 8.8*  --  8.8*  MG 2.4 2.3 2.3  --  2.3 2.3  PHOS 3.9 3.6 3.5  --  4.3 4.0   Liver Function Tests: Recent Labs  Lab 02/26/19 0208 02/27/19 0233 02/28/19 0234 03/02/19 0213  AST 27 33 29 23  ALT 32 38 40 36  ALKPHOS 142* 136* 122 106  BILITOT 0.4 0.6 0.4 0.4  PROT 6.8 6.0* 6.0* 5.7*  ALBUMIN 3.4* 3.2* 3.1* 3.1*   No results for input(s): LIPASE, AMYLASE in the last 168 hours. No results for input(s): AMMONIA in the last 168 hours. CBC: Recent Labs  Lab 02/26/19 0208 02/27/19 0233 02/28/19 0234 03/01/19 0841 03/02/19 0213  WBC 13.4* 13.0* 11.0* 12.9* 11.3*  NEUTROABS 11.4* 10.9* 10.0* 11.0* 9.4*  HGB 8.8* 8.9* 8.8* 9.6* 9.0*  HCT  29.9* 30.1* 28.6* 32.0* 29.1*  MCV 83.5 83.4 82.7 82.7 82.2  PLT 296 302 239 249 218   Cardiac Enzymes: No results for input(s): CKTOTAL, CKMB, CKMBINDEX, TROPONINI in the last 168 hours. BNP (last 3 results) Recent Labs    04/09/18 1749 02/13/19 1805 02/24/19 0325  BNP 297.8* 1,211.6* 1,344.9*    ProBNP (last 3 results) No results for input(s): PROBNP in the last 8760 hours.  CBG: Recent Labs  Lab 03/01/19 0628 03/01/19 1126 03/01/19 1600 03/01/19 2106 03/02/19 0628  GLUCAP 118* 281* 367* 393* 98    No results found for this or any previous visit (from the past 240 hour(s)).   Studies: Dg Chest Port 1 View  Result Date: 03/02/2019 CLINICAL DATA:  Shortness of breath EXAM: PORTABLE CHEST 1 VIEW COMPARISON:  02/28/2019 FINDINGS: Cardiac shadow is stable. Persistent left basilar atelectasis is seen. The right lung remains clear. Aortic calcifications are noted. No bony abnormality is seen. IMPRESSION: Stable left basilar atelectasis. Electronically Signed   By: Inez Catalina M.D.   On: 03/02/2019 08:00    Scheduled Meds: . arformoterol  15 mcg Nebulization BID  .  atorvastatin  40 mg Oral q1800  . azelastine  1 spray Each Nare BID  . budesonide (PULMICORT) nebulizer solution  0.5 mg Nebulization BID  . docusate sodium  100 mg Oral BID  . furosemide  40 mg Intravenous Q12H  . gabapentin  300 mg Oral TID  . guaiFENesin  600 mg Oral BID  . heparin  5,000 Units Subcutaneous Q8H  . insulin aspart  0-20 Units Subcutaneous TID WC  . insulin aspart  0-5 Units Subcutaneous QHS  . insulin aspart  8 Units Subcutaneous TID WC  . insulin detemir  28 Units Subcutaneous BID  . ipratropium  0.5 mg Nebulization TID  . iron polysaccharides  150 mg Oral BID  . levalbuterol  0.63 mg Nebulization TID  . macitentan  10 mg Oral Daily  . mouth rinse  15 mL Mouth Rinse BID  . midodrine  5 mg Oral TID WC  . multivitamin with minerals  1 tablet Oral Daily  . potassium chloride  20 mEq Oral  BID  . predniSONE  40 mg Oral Q breakfast  . Selexipag  600 mcg Oral BID  . sertraline  75 mg Oral Daily  . traMADol  50 mg Oral Q6H  . umeclidinium bromide  1 puff Inhalation Daily  . 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

## 2019-03-02 NOTE — Progress Notes (Signed)
NAME:  Amy Shepard, MRN:  [id number], DOB:  1946/04/18, LOS: 11 ADMISSION DATE:  02/11/2019, CONSULTATION DATE:  02/21/2019 REFERRING MD:  Dr. Sloan Leiter, CHIEF COMPLAINT:  Hypoxia  Brief History   73 year old female with chronic hypoxic respiratory failure admitted 7/31 for elective repair of nonhealing right trimalleolar ankle fracture s/p hardware removal and fusion on 7/31 by Dr. Sharol Given, being medically managed by [facility].  Post-operatively has had increased and ongoing oxygenation requirements despite optimized medical therapy with IV steroid taper and diuresis and remains on heated high flow unable to wean.  PCCM consulted for further pulmonary recommendations.   Past Medical History  Chronic hypoxic respiratory failure on home oxygen 4L, COPD, CAD, prior non-small cell lung cancer in January 2010 s/p right lower lobe superior segmentectomy with LN dissection and chemotherapy with carboplatin and etopiside, moderate pulmonary hypertension, chronic diastolic heart failure, HTN, HLD, DM, CKD stage III, anemia of chronic disease, right charcot ankle fracture  She is followed by Dr. Aundra Dubin for HF/ pulmonary hypertension  Significant Hospital Events   7/31 Admitted / OR  8/10 PCCM consult.  Add abx, steroids, changed nebs, diuresis  8/11 Afebrile, less wheeze, HFNC 100% / 30L.  Refused BiPAP overnight. Net neg 4.8.  8/12 HFNC 25L, 65% FiO2 8/14 HFNC 20L, 55% FIO2, increased to 60%  8/17 HFNC 20L 50% FiO2  8/18 dropped to 40% 8/19 back to 50% as SpO2 down to 80s  Consults:  [facility]tho [facility]   Procedures:  7/31 OR for Charcot Collapse Right Ankle with Failure Fixation  Significant Diagnostic Tests:  8/3 TTE >> EF 55 to 75%, mod RV systolic dysfx with D shaped intraventricular septum, mild AS, PAS 88 mmHg  Micro Data:  8/2 SARS CoV-2 >> negative 7/31 right ankle tissue cx >> neg  Antimicrobials:  7/30 vanc OR 8/10 azithro >>  Interim history/subjective:  Feels "much  better"; however, FiO2 back to 50% as sats dropped into low 80s.  Objective   Blood pressure (!) 100/55, pulse 68, temperature 97.9 F (36.6 C), temperature source Oral, resp. rate 13, height _0  (1.575 m), weight 76.2 kg, SpO2 90 %.    FiO2 (%):  [40 %-50 %] 50 %   Intake/Output Summary (Last 24 hours) at 03/02/2019 0901 Last data filed at 03/02/2019 0800 Gross per 24 hour  Intake 460 ml  Output 2075 ml  Net -1615 ml   Filed Weights   02/28/19 0323 03/01/19 0630 03/02/19 0320  Weight: 76.1 kg 75.9 kg 76.2 kg   Examination:  General: elderly, obese female. Sitting in recliner.  In NAD   HEENT: NCAT. EOMI.  MMM Neuro: AAOx4 no focal deficits  CV: RRR Lungs:  CTAB ABD: obese, soft, round, ndnt. Normoactive  Extremities: no obvious deformity, no cyanosis   Skin: clean, dry, warm, without rash  Assessment & Plan:   Acute on chronic hypoxic, hypercapnic respiratory failure (Baseline 4L O2) P: Continue to attempt O2 wean for SpO2 goal 88-94%, O2 needs will be slow to improve.  Will attempt to drop back to 40% today PRN BiPAP for increased WOB Continue IS, Flutter, Pulm hygiene, mobility, PT - have encouraged pt to push IS use and mobilize with PT as much as able  COPD Exacerbation with h/o RLD and Lung Ca s/p resection  -quit smoking 2008, up to 1.5ppd prior to that -s/p course of azithromycin for AECOPD P: Continue brovana + pumicort BID  Continue Incruse, Alubterol, xopenex Continue pred taper (started 68m 8/18 -  drop by 77m q2days)  Acute on chronic cor pulmonale -RHC 07/2017 with moderate PAH with PVR 4.7 WU, preserved cardiac output, near normal filling pressures.  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  Has been managed  by CHF team as an outpt for pulmonary hypertension medications  Etiology: consider autoimmune evaluation > pt has sister, brother with the same.  Sister was a smoker brother was not.  Father died of the same.  Not sure that there is any value in this at this point [facility]  P: Heart failure has signed off  Continue opsumit, uptravi  Hx of NSCLC s/p RLL superior segment resection in 2010, and chemotherapy in 2010. P: No acute interventions at this time    Goals of Care and disposition: PCCM reiterated per previous d/w our team that given her lung disease and pulmonary hypertension, would recommend against invasive support as intubation/MV would likely cause hemodynamic instability if not cardiac collapse.    Palliative has been consulted however patient is not ready to discuss code status  Disposition continues to be challenging. [facility] O2 can wean to 4-6 L. Continue O2 weaning efforts, dropping back to 40% today and will assess her response.   RaMontey HoraPAGerberulmonary & Critical Care Medicine Pager: [phone] If no answer, [phone] - [id number]/19/2020, 9:01 AM

## 2019-03-02 NOTE — Progress Notes (Signed)
Inpatient Diabetes Program Recommendations  AACE/ADA: New Consensus Statement on Inpatient Glycemic Control   Target Ranges:  Prepandial:   less than 140 mg/dL      Peak postprandial:   less than 180 mg/dL (1-2 hours)      Critically ill patients:  140 - 180 mg/dL   Results for Amy Shepard, Amy Shepard (MRN [id number]) as of 03/02/2019 08:18  Ref. Range 03/01/2019 06:28 03/01/2019 11:26 03/01/2019 16:00 03/01/2019 21:06 03/02/2019 06:28  Glucose-Capillary Latest Ref Range: 70 - 99 mg/dL 118 (H) 281 (H) 367 (H) 393 (H) 98   Review of Glycemic Control  Current orders for Inpatient glycemic control: Levemir 28 units BID, Novolog 8 units TID with meals for meal coverage, Novolog 0-20 units TID with meals, Novolog 0-5 units QHS; Prednisone 40 mg QAM  Inpatient Diabetes Program Recommendations:   Insulin-meal coverage: If steroids are continued, please consider increasing meal coverage to Novolog 12 units TID with meals.  Thanks, Barnie Alderman, RN, MSN, CDE Diabetes Coordinator Inpatient Diabetes Program [phone] (Team Pager from 8am to 5pm)

## 2019-03-03 ENCOUNTER — Encounter (HOSPITAL_COMMUNITY): Payer: Self-pay | Admitting: Internal Medicine

## 2019-03-03 ENCOUNTER — Other Ambulatory Visit: Payer: Self-pay

## 2019-03-03 ENCOUNTER — Encounter (HOSPITAL_COMMUNITY)
Admission: RE | Disposition: A | Discharge status: Home Health | Payer: Self-pay | Source: Home / Self Care | Attending: Orthopedic Surgery

## 2019-03-03 ENCOUNTER — Other Ambulatory Visit (HOSPITAL_COMMUNITY): Payer: Self-pay

## 2019-03-03 DIAGNOSIS — R0602 Shortness of breath: Secondary | ICD-10-CM

## 2019-03-03 DIAGNOSIS — I2721 Secondary pulmonary arterial hypertension: Secondary | ICD-10-CM

## 2019-03-03 DIAGNOSIS — R0902 Hypoxemia: Secondary | ICD-10-CM

## 2019-03-03 HISTORY — PX: RIGHT HEART CATH: CATH118263

## 2019-03-03 LAB — POCT I-STAT EG7
Acid-Base Excess: 2 mmol/L (ref 0.0–2.0)
Acid-Base Excess: 3 mmol/L — ABNORMAL HIGH (ref 0.0–2.0)
Acid-Base Excess: 6 mmol/L — ABNORMAL HIGH (ref 0.0–2.0)
Bicarbonate: 26.8 mmol/L (ref 20.0–28.0)
Bicarbonate: 29 mmol/L — ABNORMAL HIGH (ref 20.0–28.0)
Bicarbonate: 31.8 mmol/L — ABNORMAL HIGH (ref 20.0–28.0)
Calcium, Ion: 1.02 mmol/L — ABNORMAL LOW (ref 1.15–1.40)
Calcium, Ion: 1.14 mmol/L — ABNORMAL LOW (ref 1.15–1.40)
Calcium, Ion: 1.28 mmol/L (ref 1.15–1.40)
HCT: 25 % — ABNORMAL LOW (ref 36.0–46.0)
HCT: 26 % — ABNORMAL LOW (ref 36.0–46.0)
HCT: 29 % — ABNORMAL LOW (ref 36.0–46.0)
Hemoglobin: 8.5 g/dL — ABNORMAL LOW (ref 12.0–15.0)
Hemoglobin: 8.8 g/dL — ABNORMAL LOW (ref 12.0–15.0)
Hemoglobin: 9.9 g/dL — ABNORMAL LOW (ref 12.0–15.0)
O2 Saturation: 69 %
O2 Saturation: 72 %
O2 Saturation: 74 %
Potassium: 3.8 mmol/L (ref 3.5–5.1)
Potassium: 4 mmol/L (ref 3.5–5.1)
Potassium: 4.4 mmol/L (ref 3.5–5.1)
Sodium: 137 mmol/L (ref 135–145)
Sodium: 142 mmol/L (ref 135–145)
Sodium: 143 mmol/L (ref 135–145)
TCO2: 28 mmol/L (ref 22–32)
TCO2: 31 mmol/L (ref 22–32)
TCO2: 33 mmol/L — ABNORMAL HIGH (ref 22–32)
pCO2, Ven: 45 mmHg (ref 44.0–60.0)
pCO2, Ven: 48.8 mmHg (ref 44.0–60.0)
pCO2, Ven: 51.4 mmHg (ref 44.0–60.0)
pH, Ven: 7.382 (ref 7.250–7.430)
pH, Ven: 7.384 (ref 7.250–7.430)
pH, Ven: 7.399 (ref 7.250–7.430)
pO2, Ven: 37 mmHg (ref 32.0–45.0)
pO2, Ven: 38 mmHg (ref 32.0–45.0)
pO2, Ven: 40 mmHg (ref 32.0–45.0)

## 2019-03-03 LAB — GLUCOSE, CAPILLARY
Glucose-Capillary: 84 mg/dL (ref 70–99)
Glucose-Capillary: 255 mg/dL — ABNORMAL HIGH (ref 70–99)
Glucose-Capillary: 106 mg/dL — ABNORMAL HIGH (ref 70–99)
Glucose-Capillary: 278 mg/dL — ABNORMAL HIGH (ref 70–99)

## 2019-03-03 LAB — MAGNESIUM: Magnesium: 2.5 mg/dL — ABNORMAL HIGH (ref 1.7–2.4)

## 2019-03-03 LAB — BASIC METABOLIC PANEL
Anion gap: 11 (ref 5–15)
BUN: 56 mg/dL — ABNORMAL HIGH (ref 8–23)
CO2: 30 mmol/L (ref 22–32)
Calcium: 9 mg/dL (ref 8.9–10.3)
Chloride: 98 mmol/L (ref 98–111)
Creatinine, Ser: 1.21 mg/dL — ABNORMAL HIGH (ref 0.44–1.00)
GFR calc Af Amer: 52 mL/min — ABNORMAL LOW (ref >60)
GFR calc non Af Amer: 45 mL/min — ABNORMAL LOW (ref >60)
Glucose, Bld: 128 mg/dL — ABNORMAL HIGH (ref 70–99)
Potassium: 4.2 mmol/L (ref 3.5–5.1)
Sodium: 139 mmol/L (ref 135–145)

## 2019-03-03 SURGERY — RIGHT HEART CATH
Anesthesia: LOCAL

## 2019-03-03 MED ORDER — UPTRAVI 600 MCG PO TABS: 600.0000 ug | ORAL_TABLET | Freq: Two times a day (BID) | ORAL | 11 refills | Status: DC

## 2019-03-03 MED ORDER — ASPIRIN 81 MG PO CHEW
CHEWABLE_TABLET | ORAL | Status: AC
  Filled 2019-03-03: qty 1

## 2019-03-03 MED ORDER — HEPARIN SODIUM (PORCINE) 5000 UNIT/ML IJ SOLN: 5000.0000 [IU] | Freq: Three times a day (TID) | INTRAMUSCULAR | Status: DC

## 2019-03-03 MED ORDER — SODIUM CHLORIDE 0.9% FLUSH
3.0000 mL | Freq: Two times a day (BID) | INTRAVENOUS | Status: DC
  Administered 2019-03-03 – 2019-03-11 (×14): 3 mL via INTRAVENOUS

## 2019-03-03 MED ORDER — SODIUM CHLORIDE 0.9 % IV SOLN: INTRAVENOUS | Status: DC

## 2019-03-03 MED ORDER — HYDRALAZINE HCL 20 MG/ML IJ SOLN: 10.0000 mg | INTRAMUSCULAR | Status: AC | PRN

## 2019-03-03 MED ORDER — LABETALOL HCL 5 MG/ML IV SOLN: 10.0000 mg | INTRAVENOUS | Status: AC | PRN

## 2019-03-03 MED ORDER — LIDOCAINE HCL (PF) 1 % IJ SOLN
INTRAMUSCULAR | Status: DC | PRN
  Administered 2019-03-03: 1 mL

## 2019-03-03 MED ORDER — ACETAMINOPHEN 325 MG PO TABS
650.0000 mg | ORAL_TABLET | ORAL | Status: DC | PRN
  Administered 2019-03-05: 650 mg via ORAL
  Filled 2019-03-03: qty 2

## 2019-03-03 MED ORDER — HEPARIN (PORCINE) IN NACL 1000-0.9 UT/500ML-% IV SOLN
INTRAVENOUS | Status: DC | PRN
  Administered 2019-03-03: 500 mL

## 2019-03-03 MED ORDER — SODIUM CHLORIDE 0.9 % IV SOLN: 250.0000 mL | INTRAVENOUS | Status: DC | PRN

## 2019-03-03 MED ORDER — ONDANSETRON HCL 4 MG/2ML IJ SOLN: 4.0000 mg | Freq: Four times a day (QID) | INTRAMUSCULAR | Status: DC | PRN

## 2019-03-03 MED ORDER — LIDOCAINE HCL (PF) 1 % IJ SOLN
INTRAMUSCULAR | Status: AC
  Filled 2019-03-03: qty 30

## 2019-03-03 MED ORDER — SODIUM CHLORIDE 0.9% FLUSH: 3.0000 mL | INTRAVENOUS | Status: DC | PRN

## 2019-03-03 MED ORDER — ASPIRIN 81 MG PO CHEW
81.0000 mg | CHEWABLE_TABLET | Freq: Once | ORAL | Status: AC
  Administered 2019-03-03: 81 mg via ORAL

## 2019-03-03 MED ORDER — SODIUM CHLORIDE 0.9% FLUSH: 3.0000 mL | Freq: Two times a day (BID) | INTRAVENOUS | Status: DC

## 2019-03-03 MED ORDER — HEPARIN (PORCINE) IN NACL 1000-0.9 UT/500ML-% IV SOLN
INTRAVENOUS | Status: AC
  Filled 2019-03-03: qty 500

## 2019-03-03 SURGICAL SUPPLY — 9 items
CATH BALLN WEDGE 5F 110CM (CATHETERS) ×1 IMPLANT
GUIDEWIRE .025 260CM (WIRE) ×1 IMPLANT
PACK CARDIAC CATHETERIZATION (CUSTOM PROCEDURE TRAY) ×2 IMPLANT
PROTECTION STATION PRESSURIZED (MISCELLANEOUS) ×2
SHEATH GLIDE SLENDER 4/5FR (SHEATH) ×1 IMPLANT
STATION PROTECTION PRESSURIZED (MISCELLANEOUS) IMPLANT
TRANSDUCER W/STOPCOCK (MISCELLANEOUS) ×2 IMPLANT
TUBING ART PRESS 72  MALE/FEM (TUBING) ×1
TUBING ART PRESS 72 MALE/FEM (TUBING) IMPLANT

## 2019-03-03 NOTE — Progress Notes (Signed)
NAME:  Amy Shepard, MRN:  [id number], DOB:  1946/04/13, LOS: 1 ADMISSION DATE:  02/11/2019, CONSULTATION DATE:  02/21/2019 REFERRING MD:  Dr. Sloan Leiter, CHIEF COMPLAINT:  Hypoxia  Brief History   73 year old female with chronic hypoxic respiratory failure admitted 7/31 for elective repair of nonhealing right trimalleolar ankle fracture s/p hardware removal and fusion on 7/31 by Dr. Sharol Given, being medically managed by [facility].  Post-operatively has had increased and ongoing oxygenation requirements despite optimized medical therapy with IV steroid taper and diuresis and remains on heated high flow unable to wean.  PCCM consulted for further pulmonary recommendations.   Past Medical History  Chronic hypoxic respiratory failure on home oxygen 4L, COPD, CAD, prior non-small cell lung cancer in January 2010 s/p right lower lobe superior segmentectomy with LN dissection and chemotherapy with carboplatin and etopiside, moderate pulmonary hypertension, chronic diastolic heart failure, HTN, HLD, DM, CKD stage III, anemia of chronic disease, right charcot ankle fracture  She is followed by Dr. Aundra Dubin for HF/ pulmonary hypertension  Significant Hospital Events   7/31 Admitted / OR  8/10 PCCM consult.  Add abx, steroids, changed nebs, diuresis  8/11 Afebrile, less wheeze, HFNC 100% / 30L.  Refused BiPAP overnight. Net neg 4.8.  8/12 HFNC 25L, 65% FiO2 8/14 HFNC 20L, 55% FIO2, increased to 60%  8/17 HFNC 20L 50% FiO2  8/18 dropped to 40% 8/19 back to 50% as SpO2 down to 80s  Consults:  [facility]   Procedures:  7/31 OR for Charcot Collapse Right Ankle with Failure Fixation  Significant Diagnostic Tests:  8/3 TTE >> EF 55 to 37%, mod RV systolic dysfx with D shaped intraventricular septum, mild AS, PAS 88 mmHg  Micro Data:  8/2 SARS CoV-2 >> negative 7/31 right ankle tissue cx >> neg  Antimicrobials:  7/30 vanc OR 8/10 azithro >>off  Interim history/subjective:   Remains  hypoxic, back on 50%, 30 L high flow  Objective   Blood pressure (!) 127/58, pulse 71, temperature 98.3 F (36.8 C), temperature source Oral, resp. rate 18, height _0  (1.575 m), weight 76.8 kg, SpO2 93 %.    FiO2 (%):  [40 %-45 %] 45 %   Intake/Output Summary (Last 24 hours) at 03/03/2019 1134 Last data filed at 03/03/2019 0935 Gross per 24 hour  Intake 1020 ml  Output 1550 ml  Net -530 ml   Filed Weights   03/01/19 0630 03/02/19 0320 03/03/19 8588  Weight: 75.9 kg 76.2 kg 76.8 kg   Examination:  General: elderly, obese female. Sitting in recliner.  In NAD   HEENT: Pallor, no icterus Neuro: AAOx4 no focal deficits  CV: RRR Lungs: Decreased breath sounds bilateral, no rhonchi ABD: obese, soft, round, ndnt. Normoactive  Extremities: no obvious deformity, no cyanosis   Skin: clean, dry, warm, without rash  Assessment & Plan:   Acute on chronic hypoxic, hypercapnic respiratory failure (Baseline 4L O2) P: Continue to attempt O2 wean for SpO2 goal 88% Off BiPAP  Continue IS, Flutter, Pulm hygiene, mobility, PT - have encouraged pt to push IS use and mobilize with PT as much as able  COPD Exacerbation  Hx of NSCLC s/p RLL superior segment resection in 2010, and chemotherapy in 2010. -quit smoking 2008, up to 1.5ppd prior to that -s/p course of azithromycin for AECOPD P: Continue brovana + pumicort BID  Continue Incruse, Alubterol, xopenex Continue pred taper (started 28m 8/18 - drop by 111mq2days)  Acute on chronic cor pulmonale Pulmonary hypertension. -  [place] 07/2017 with moderate PAH with PVR 4.7 WU, preserved cardiac output, near normal filling pressures.  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  Has been managed by CHF team as an outpt for pulmonary hypertension medications  Etiology: consider autoimmune evaluation > pt has sister, brother with the same.  Sister was a smoker brother was not.  Father died of the same. P:  Have asked heart failure service to see?  Need for repeat right  heart cath Continue opsumit, uptravi    Goals of Care and disposition: Limited CODE STATUS issued now, no CPR, no intubation appreciate palliative care discussions  Disposition unclear due to high FiO2 requirements   Kara Mead MD. FCCP. [facility] Pager [phone] If no response call [phone]   03/03/2019    03/03/2019, 11:34 AM

## 2019-03-03 NOTE — Interval H&P Note (Signed)
History and Physical Interval Note:  03/03/2019 1:45 PM  Amy Shepard  has presented today for surgery, with the diagnosis of Pulmonary Hypertention.  The various methods of treatment have been discussed with the patient and family. After consideration of risks, benefits and other options for treatment, the patient has consented to  Procedure(s): RIGHT HEART CATH (N/A) as a surgical intervention.  The patient's history has been reviewed, patient examined, no change in status, stable for surgery.  I have reviewed the patient's chart and labs.  Questions were answered to the patient's satisfaction.     [name]

## 2019-03-03 NOTE — Progress Notes (Signed)
[facility]  PROGRESS NOTE  Amy Shepard Amy Shepard:[id number] DOB: Jan 29, 1946 DOA: 02/11/2019 PCP: Maurice Small, MD  Brief History    [name] is a 73 y.o. year old female with medical history significant for chronic hypoxic respiratory failure secondary to COPD/moderate pulmonary hypertension on oxygen 4 L, CAD, prior non-small cell lung cancer in January 2010 s/p right lower lobe superior segmentectomy, hypertension, chronic diastolic heart failure, HTN, HLD, DM, CKD stage III, anemia of chronic disease, right charcot ankle fracture  who presented on 02/11/2019 with planned elective repair of right trimalleolar fracture by Dr. Sharol Given.  Patient underwent hardware removal and fusion of ankle but postoperatively developed worsening hypoxia.  Patient currently has acute on chronic hypoxic respiratory failure requiring up to 20 L of high flow nasal cannula to maintain normal oxygen saturations.  PCCM is followingDue to concern that this is related to worsening pulmonary hypertension/acute cor pulmonale.  Palliative care is also been consulted to assist with goals of care given poor prognosis  A & P   Acute on chronic respiratory failure, continues to require 20 L high flow nasal cannula to maintain normal oxygen saturations, no overt increased WOB/respiratory distress.  Severe pulmonary hypertension at baseline typically requiring 4 L here requiring upwards of 20 L of high flow nasal cannula to maintain normal oxygen saturations greater than 92%. Her goal Spo2 is 85% per PCCM Suspect have addition of acute on chronic CHF/COPD flare/poor reserve related to pulmonary hypertension with resultant cor pulmonale.  Right heart cath shows mild pulmonary arterial hypertension no clear reason for persistent O2 requirements.  Pulmonary consultants assisting with oxygenation weaning, wean prednisone, continue IV Lasix 40 mg twice daily, incentive spirometry, flutter valve.  Palliative care has been  consulted given her lung disease will likely not do well with any intubation per pulmonary recommendations  Acute on chronic cor pulmonale.  Has mild pulmonary arterial hypertension with normal left-sided filling pressures based on most recent right heart cath (8/20), risk of shunting outweighs any benefits and increasing her PAH meds per advanced heart failure consultants.  Continue home Opsumit, Selexipag.  Continue IV Lasix 40 mg twice daily given persistent O2 requirement.Monitor daily weights, (-, 1.8 L 24 hours, wgt stable)  Nonhealing right trimalleolar ankle fracture status post hardware removal and fusion on 7/31 by Dr. Sharol Given  Acute on chronic COPD exacerbation.  No overt wheezing heard on exam.  Continue scheduled inhalers.  Undergoing current prednisone taper down 35mlligrams decrease every 2 days until off.  Type 2 diabetes with hyperglycemia, no, patient peripheral neuropathy.  A1c 6.9.  Hyperglycemia worsened in setting of concurrent steroid use, slowly tapering off will expect improvement with that, continue Levemir 28 units twice daily, sliding scale as needed, scheduled short-acting 3 times daily with meals  Anemia of chronic disease (CKD), hemoglobin stable.  Transfuse for hemoglobin less than 7 no active signs of bleeding.  CKD stage III.  1.2-1.6.  Creatinine currently stable.  Continue to monitor output, avoid nephrotoxins.  Orthostatic hypotension, continue home midodrine  Depression, stable continue Zoloft   DVT prophylaxis: Heparin Code Status: Partial) change 8/19 after discussion with plan of care) Family Communication: No family at bedside Disposition Plan: Still requiring 20 L high flow nasal cannula to maintain appropriate oxygen saturation, hopeful to wean O2, treating with continue IV Lasix diuresis.     [facility] Direct contact: see [email or web address] (further directions at bottom of note if needed) 7PM-7AM contact night coverage as at bottom of  note 03/03/2019,  9:08 AM  LOS: 20 days   Consultants   AHF, PCCM, Palliative  Procedures  02/11/2019-removal of deep hardware right ankle and right ankle fusion by Dr. Sharol Given.  03/03/2019, right heart cath: Excellent diuresis with normal left-sided filling pressures 2. Mild PAH on therapy 3. No evidence of intracardiac shunting  Antibiotics     Interval History/Subjective  Refused fascial overnight due to mouth breathing.  States her breathing is about the same.  Denies any chest pain.  Objective   Vitals:  Vitals:   03/02/19 2053 03/02/19 2356  BP:  (!) 127/58  Pulse: 80 73  Resp: 20 14  Temp:  97.9 F (36.6 C)  SpO2: (!) 79%     Exam:  Awake Alert, Oriented X 3, No new F.N deficits, Normal affect [place].AT,PERRAL Supple Neck,No JVD appreciated,   Symmetrical Chest wall movement, Good air movement bilaterally, CTAB, able to speak in complete sentences maintaining O2 sats greater than 85% on 20 L high flow nasal cannula RRR,No Gallops,Rubs or new Murmurs, No Parasternal Heave +ve B.Sounds, Abd Soft, No tenderness, No organomegaly appriciated, No rebound - guarding or rigidity. No Cyanosis, Clubbing or edema, No new Rash or bruise    I have personally reviewed the following:   Data Reviewed: Basic Metabolic Panel: Recent Labs  Lab 02/26/19 0208 02/27/19 0233 02/28/19 0234 03/01/19 0237 03/01/19 0841 03/02/19 0213 03/03/19 0223  NA 139 139 137 136  --  138 139  K 3.8 4.1 4.0 3.9  --  3.9 4.2  CL 99 98 96* 96*  --  99 98  CO2 _0 --  29 30  GLUCOSE 195* 119* 281* 261*  --  226* 128*  BUN 61* 63* 59* 61*  --  58* 56*  CREATININE 1.18* 1.34* 1.30* 1.36*  --  1.24* 1.21*  CALCIUM 9.5 9.1 8.8* 8.8*  --  8.8* 9.0  MG 2.4 2.3 2.3  --  2.3 2.3 2.5*  PHOS 3.9 3.6 3.5  --  4.3 4.0  --    Liver Function Tests: Recent Labs  Lab 02/26/19 0208 02/27/19 0233 02/28/19 0234 03/02/19 0213  AST 27 33 29 23  ALT 32 38 40 36  ALKPHOS 142* 136* 122 106  BILITOT  0.4 0.6 0.4 0.4  PROT 6.8 6.0* 6.0* 5.7*  ALBUMIN 3.4* 3.2* 3.1* 3.1*   No results for input(s): LIPASE, AMYLASE in the last 168 hours. No results for input(s): AMMONIA in the last 168 hours. CBC: Recent Labs  Lab 02/26/19 0208 02/27/19 0233 02/28/19 0234 03/01/19 0841 03/02/19 0213  WBC 13.4* 13.0* 11.0* 12.9* 11.3*  NEUTROABS 11.4* 10.9* 10.0* 11.0* 9.4*  HGB 8.8* 8.9* 8.8* 9.6* 9.0*  HCT 29.9* 30.1* 28.6* 32.0* 29.1*  MCV 83.5 83.4 82.7 82.7 82.2  PLT 296 302 239 249 218   Cardiac Enzymes: No results for input(s): CKTOTAL, CKMB, CKMBINDEX, TROPONINI in the last 168 hours. BNP (last 3 results) Recent Labs    04/09/18 1749 02/13/19 1805 02/24/19 0325  BNP 297.8* 1,211.6* 1,344.9*    ProBNP (last 3 results) No results for input(s): PROBNP in the last 8760 hours.  CBG: Recent Labs  Lab 03/02/19 0628 03/02/19 1116 03/02/19 1549 03/02/19 2058 03/03/19 0621  GLUCAP 98 154* 241* 192* 84    No results found for this or any previous visit (from the past 240 hour(s)).   Studies: Dg Chest Port 1 View  Result Date: 03/02/2019 CLINICAL DATA:  Shortness of breath EXAM: PORTABLE CHEST 1  VIEW COMPARISON:  02/28/2019 FINDINGS: Cardiac shadow is stable. Persistent left basilar atelectasis is seen. The right lung remains clear. Aortic calcifications are noted. No bony abnormality is seen. IMPRESSION: Stable left basilar atelectasis. Electronically Signed   By: Inez Catalina M.D.   On: 03/02/2019 08:00    Scheduled Meds:  arformoterol  15 mcg Nebulization BID   atorvastatin  40 mg Oral q1800   azelastine  1 spray Each Nare BID   budesonide (PULMICORT) nebulizer solution  0.5 mg Nebulization BID   docusate sodium  100 mg Oral BID   furosemide  40 mg Intravenous Q12H   gabapentin  300 mg Oral TID   guaiFENesin  600 mg Oral BID   heparin  5,000 Units Subcutaneous Q8H   insulin aspart  0-20 Units Subcutaneous TID WC   insulin aspart  0-5 Units Subcutaneous QHS    insulin aspart  8 Units Subcutaneous TID WC   insulin detemir  28 Units Subcutaneous BID   ipratropium  0.5 mg Nebulization TID   iron polysaccharides  150 mg Oral BID   levalbuterol  0.63 mg Nebulization TID   macitentan  10 mg Oral Daily   mouth rinse  15 mL Mouth Rinse BID   midodrine  5 mg Oral TID WC   multivitamin with minerals  1 tablet Oral Daily   potassium chloride  20 mEq Oral BID   predniSONE  30 mg Oral Q breakfast   Followed by   Derrill Memo ON 03/05/2019] predniSONE  20 mg Oral Q breakfast   Followed by   Derrill Memo ON 03/07/2019] predniSONE  10 mg Oral Q breakfast   Selexipag  600 mcg Oral BID   sertraline  75 mg Oral Daily   traMADol  50 mg Oral Q6H   umeclidinium bromide  1 puff Inhalation Daily   vitamin C  250 mg Oral BID   Continuous Infusions:  Active Problems:   Chronic obstructive pulmonary disease ([name])   Type 2 diabetes mellitus with complication, with long-term current use of insulin ([name])   HTN (hypertension)   Acute respiratory failure with hypoxia ([name])   Chronic diastolic CHF (congestive heart failure) ([name])   Pulmonary fibrosis ([name])   Acute on chronic postoperative respiratory failure ([name])   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

## 2019-03-03 NOTE — Progress Notes (Signed)
Pt currently on HFNC 25 Lpm and 45% FIO2 and maintaining sats within sat goal of 88-92%. Pt not in need of BIPAP PRN. RT will continue to monitor.

## 2019-03-03 NOTE — Progress Notes (Signed)
Patient ID: Amy Shepard, female   DOB: [birth year], 73 y.o.   MRN: [id number]      Advanced Heart Failure Rounding Note  PCP-Cardiologist: No primary care provider on file.   Subjective:    Has diuresed well and weight stable in 167-169 arrange but unable to wean high flow O2.   She denies sob, orthopnea or PND.    Objective:   Weight Range: 76.8 kg Body mass index is 30.97 kg/m.   Vital Signs:   Temp:  [97.8 F (36.6 C)-98.3 F (36.8 C)] 98.3 F (36.8 C) (08/20 1043) Pulse Rate:  [71-80] 71 (08/20 0837) Resp:  [14-20] 18 (08/20 1043) BP: (116-127)/(54-63) 127/58 (08/19 2356) SpO2:  [79 %-97 %] 93 % (08/20 0837) FiO2 (%):  [40 %-45 %] 45 % (08/20 1043) Weight:  [76.8 kg] 76.8 kg (08/20 0632) Last BM Date: 03/02/19  Weight change: Filed Weights   03/01/19 0630 03/02/19 0320 03/03/19 5631  Weight: 75.9 kg 76.2 kg 76.8 kg    Intake/Output:   Intake/Output Summary (Last 24 hours) at 03/03/2019 1137 Last data filed at 03/03/2019 0935 Gross per 24 hour  Intake 1020 ml  Output 1550 ml  Net -530 ml      Physical Exam    General:  Sitting in chair on HFNC. No resp difficulty HEENT: normal General:  Well appearing. No resp difficulty HEENT: normal Neck: supple. JVP 7-8. Carotids 2+ bilat; no bruits. No lymphadenopathy or thryomegaly appreciated. Cor: PMI nondisplaced. Regular rate & rhythm. No rubs, gallops or murmurs. Lungs: clear with decreased BS throguhout Abdomen: soft, nontender, nondistended. No hepatosplenomegaly. No bruits or masses. Good bowel sounds. Extremities: no cyanosis, clubbing, rash, edema Neuro: alert & orientedx3, cranial nerves grossly intact. moves all 4 extremities w/o difficulty. Affect pleasant  Telemetry     NSR 70-80s Personally reviewed    Labs    CBC Recent Labs    03/01/19 0841 03/02/19 0213  WBC 12.9* 11.3*  NEUTROABS 11.0* 9.4*  HGB 9.6* 9.0*  HCT 32.0* 29.1*  MCV 82.7 82.2  PLT 249 497   Basic Metabolic  Panel Recent Labs    03/01/19 0841 03/02/19 0213 03/03/19 0223  NA  --  138 139  K  --  3.9 4.2  CL  --  99 98  CO2  --  29 30  GLUCOSE  --  226* 128*  BUN  --  58* 56*  CREATININE  --  1.24* 1.21*  CALCIUM  --  8.8* 9.0  MG 2.3 2.3 2.5*  PHOS 4.3 4.0  --    Liver Function Tests Recent Labs    03/02/19 0213  AST 23  ALT 36  ALKPHOS 106  BILITOT 0.4  PROT 5.7*  ALBUMIN 3.1*   No results for input(s): LIPASE, AMYLASE in the last 72 hours. Cardiac Enzymes No results for input(s): CKTOTAL, CKMB, CKMBINDEX, TROPONINI in the last 72 hours.  BNP: BNP (last 3 results) Recent Labs    04/09/18 1749 02/13/19 1805 02/24/19 0325  BNP 297.8* 1,211.6* 1,344.9*    ProBNP (last 3 results) No results for input(s): PROBNP in the last 8760 hours.   D-Dimer No results for input(s): DDIMER in the last 72 hours. Hemoglobin A1C No results for input(s): HGBA1C in the last 72 hours. Fasting Lipid Panel No results for input(s): CHOL, HDL, LDLCALC, TRIG, CHOLHDL, LDLDIRECT in the last 72 hours. Thyroid Function Tests No results for input(s): TSH, T4TOTAL, T3FREE, THYROIDAB in the last 72 hours.  Invalid input(s):  FREET3  Other results:   Imaging    No results found.   Medications:     Scheduled Medications: . arformoterol  15 mcg Nebulization BID  . atorvastatin  40 mg Oral q1800  . azelastine  1 spray Each Nare BID  . budesonide (PULMICORT) nebulizer solution  0.5 mg Nebulization BID  . docusate sodium  100 mg Oral BID  . furosemide  40 mg Intravenous Q12H  . gabapentin  300 mg Oral TID  . guaiFENesin  600 mg Oral BID  . heparin  5,000 Units Subcutaneous Q8H  . insulin aspart  0-20 Units Subcutaneous TID WC  . insulin aspart  0-5 Units Subcutaneous QHS  . insulin aspart  8 Units Subcutaneous TID WC  . insulin detemir  28 Units Subcutaneous BID  . ipratropium  0.5 mg Nebulization TID  . iron polysaccharides  150 mg Oral BID  . levalbuterol  0.63 mg  Nebulization TID  . macitentan  10 mg Oral Daily  . mouth rinse  15 mL Mouth Rinse BID  . midodrine  5 mg Oral TID WC  . multivitamin with minerals  1 tablet Oral Daily  . potassium chloride  20 mEq Oral BID  . predniSONE  30 mg Oral Q breakfast   Followed by  . [START ON 03/05/2019] predniSONE  20 mg Oral Q breakfast   Followed by  . [START ON 03/07/2019] predniSONE  10 mg Oral Q breakfast  . Selexipag  600 mcg Oral BID  . sertraline  75 mg Oral Daily  . traMADol  50 mg Oral Q6H  . umeclidinium bromide  1 puff Inhalation Daily  . vitamin C  250 mg Oral BID    Infusions: . sodium chloride      PRN Medications: albuterol, HYDROcodone-acetaminophen, metoCLOPramide **OR** metoCLOPramide (REGLAN) injection, morphine injection, ondansetron **OR** ondansetron (ZOFRAN) IV, sodium chloride   Assessment/Plan   1. Acute on chronic hypoxemic respiratory failure: She is now on high flow oxygen.  She is on home oxygen at baseline.  Past PFTs were restrictive, but last CT chest in 6/20 showed emphysema.  She is being treated for AECOPD and is being diuresed for CHF.  - Continue Solumedrol per primary service.  She has completed azithromycin course.  - I have d/w Dr. Elsworth Soho. Unable to wean HF O2 with nasal cannula - Volume status looks good on exam and weight down.  - Will plan RHC today reassess volume status and PA pressures. If volume status stable and PA pressures markedly elevated can do trial of inhaled NO or sildenafil (has failed tadalafil before due to visual changes)  2. 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 Serologies negative. Restrictive PFTs but lung parenchyma looked relatively normal on 1/18 CTA chest. Findings by PFTs and CT not consistent with emphysema. V/Q scan negative for chronic or acute PEs. Serologic workup was negative. RHC (1/19) showed moderate pulmonary arterial hypertension. Echo in 8/18 showed dilated and dysfunctional RV, similar in 11/18 and 9/19. Echo in  1/61 showed PA systolic pressure still very high at 81 mmHg. Sleep study showed nocturnal hypoxemia but not OSA. She wears home oxygen. She is currently on Opsumit and selexipag. Echo in 8/20 showed normal LV EF, moderate RV dilation/moderate dysfunction with D-shaped septum and PASP 80 mmHg, mild AS.  - Continue current dose of selexipag. Intolerant higher doses..  - Continue Opsumit.  - Will plan RHC today reassess volume status and PA pressures. If volume status stable and PA pressures markedly elevated  can do trial of inhaled NO or sildenafil (has failed tadalafil before due to visual changes)  3. Acute on chronic diastolic CHF: With prominent RV failure. - See above. Volume status appears optimized.  Weight at least 5 pounds below previous baseline.  - RHC today 4. CKD: Stage 3. Creatinine 1.21 today 5. CAD: Nonobstructive in 2015. No exertional CP.  - Continue atorvastatin 40 mg daily.  6. Orthostatic hypotension: RV failure may play a role.  -Continuemidodrine 5 mg TID 7. R ankle Fracture, S/P ORIF 11/26/2018.  Now this admission s/p debridement, removal hardware, R ankle fusion.  - PT. Per Dr. Sharol Given.    Length of Stay: [place], MD  03/03/2019, 11:37 AM  Advanced Heart Failure Team Pager [phone] (M-F; 7a - 4p)  Please contact [facility] for night-coverage after hours (4p -7a ) and weekends on [email or web address]

## 2019-03-03 NOTE — Progress Notes (Signed)
Patient ID: Amy Shepard, female   DOB: [birth year], 73 y.o.   MRN: [id number] Patient without complaints this morning.  No improvement in her pulmonary function.

## 2019-03-03 NOTE — Progress Notes (Signed)
Physical Therapy Treatment Patient Details Name: Amy Shepard MRN: [id number] DOB: Jun 10, 1946 Today's Date: 03/03/2019    History of Present Illness 73 yo 10wks s/p trimalleolar Rt ankle fx with syndesmotic disruption. 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 7/31 hardware removal with ankle fusion, wound VAC. PMHx: lung CA, CAD, spinal fusion, depression, CHF, pulmonary HTN, CKD    PT Comments    Pt admitted with above diagnosis. Pt was able to take a few steps from chair to bed after exercising.  Nurse asked PT to assist back to bed as she was going for heart CATH.  Desat to 77% with transfer and took 3 min for sats to return >90%.  Will follow acutely.  Pt currently with functional limitations due to balance and endurance deficits. Pt will benefit from skilled PT to increase their independence and safety with mobility to allow discharge to the venue listed below.     Follow Up Recommendations  SNF     Equipment Recommendations  None recommended by PT    Recommendations for Other Services OT consult     Precautions / Restrictions Precautions Precautions: Fall Precaution Comments: watch O2 Required Braces or Orthoses: Other Brace Other Brace: CAM boot RLE Restrictions Weight Bearing Restrictions: Yes RUE Weight Bearing: Weight bearing as tolerated LUE Weight Bearing: Weight bearing as tolerated RLE Weight Bearing: Weight bearing as tolerated RLE Partial Weight Bearing Percentage or Pounds: body LLE Weight Bearing: Weight bearing as tolerated Other Position/Activity Restrictions: WBAT for transfer only with CAM boot    Mobility  Bed Mobility               General bed mobility comments: OOB in recliner upon entry   Transfers Overall transfer level: Needs assistance Equipment used: 1 person hand held assist Transfers: Sit to/from [facility] Sit to Stand: Min guard Stand pivot transfers: Min assist       General  transfer comment: assist to pivot with lines and HHA from recliner to bed as nurse asked PT to assist pt back to bed as she is going for CATH  Ambulation/Gait             General Gait Details: unable per orders   Stairs             Wheelchair Mobility    Modified Rankin (Stroke Patients Only)       Balance Overall balance assessment: Needs assistance Sitting-balance support: No upper extremity supported;Feet supported Sitting balance-Leahy Scale: Good     Standing balance support: Bilateral upper extremity supported;During functional activity Standing balance-Leahy Scale: Poor Standing balance comment: UE support in standing                            Cognition Arousal/Alertness: Awake/alert Behavior During Therapy: WFL for tasks assessed/performed Overall Cognitive Status: Within Functional Limits for tasks assessed                                 General Comments: able to provide history, answer questions once more alert and EOB      Exercises General Exercises - Lower Extremity Ankle Circles/Pumps: AROM;Both;20 reps;Seated Quad Sets: AROM;Both;10 reps;Seated Long Arc Quad: AROM;Both;Seated;15 reps Hip Flexion/Marching: AROM;Both;10 reps;Seated    General Comments General comments (skin integrity, edema, etc.): Pt on 45% FiO2 with 25 L O2 today.  Pt sats 90% with exercises.  Pt sats  dropped to 77% after transfer for about 3 min until she recovered to >90%.       Pertinent Vitals/Pain Faces Pain Scale: No hurt Pain Location: R foot with weight bearing Pain Descriptors / Indicators: Grimacing;Moaning    Home Living                      Prior Function            PT Goals (current goals can now be found in the care plan section) Acute Rehab PT Goals Patient Stated Goal: to get strong enough to get home  Progress towards PT goals: Progressing toward goals    Frequency    Min 2X/week      PT Plan Current  plan remains appropriate    Co-evaluation              AM-PAC PT "6 Clicks" Mobility   Outcome Measure  Help needed turning from your back to your side while in a flat bed without using bedrails?: A Little Help needed moving from lying on your back to sitting on the side of a flat bed without using bedrails?: A Little Help needed moving to and from a bed to a chair (including a wheelchair)?: A Little Help needed standing up from a chair using your arms (e.g., wheelchair or bedside chair)?: A Little Help needed to walk in hospital room?: Total Help needed climbing 3-5 steps with a railing? : Total 6 Click Score: 14    End of Session Equipment Utilized During Treatment: Oxygen;Other (comment)(camboot) Activity Tolerance: Treatment limited secondary to medical complications (Comment)(hypoxic with mobility chair to bed on HFNC) Patient left: in bed;with call bell/phone within reach;with nursing/sitter in room Nurse Communication: Mobility status PT Visit Diagnosis: Muscle weakness (generalized) (M62.81);Other abnormalities of gait and mobility (R26.89)     Time: 1594-5859 PT Time Calculation (min) (ACUTE ONLY): 17 min  Charges:  $Therapeutic Activity: 8-22 mins                     [place] Pager:  [phone]  Office:  [place] 03/03/2019, 2:17 PM

## 2019-03-03 NOTE — H&P (View-Only) (Signed)
Patient ID: Amy Shepard, female   DOB: [birth year], 73 y.o.   MRN: [id number]      Advanced Heart Failure Rounding Note  PCP-Cardiologist: No primary care provider on file.   Subjective:    Has diuresed well and weight stable in 167-169 arrange but unable to wean high flow O2.   She denies sob, orthopnea or PND.    Objective:   Weight Range: 76.8 kg Body mass index is 30.97 kg/m.   Vital Signs:   Temp:  [97.8 F (36.6 C)-98.3 F (36.8 C)] 98.3 F (36.8 C) (08/20 1043) Pulse Rate:  [71-80] 71 (08/20 0837) Resp:  [14-20] 18 (08/20 1043) BP: (116-127)/(54-63) 127/58 (08/19 2356) SpO2:  [79 %-97 %] 93 % (08/20 0837) FiO2 (%):  [40 %-45 %] 45 % (08/20 1043) Weight:  [76.8 kg] 76.8 kg (08/20 0632) Last BM Date: 03/02/19  Weight change: Filed Weights   03/01/19 0630 03/02/19 0320 03/03/19 5170  Weight: 75.9 kg 76.2 kg 76.8 kg    Intake/Output:   Intake/Output Summary (Last 24 hours) at 03/03/2019 1137 Last data filed at 03/03/2019 0935 Gross per 24 hour  Intake 1020 ml  Output 1550 ml  Net -530 ml      Physical Exam    General:  Sitting in chair on HFNC. No resp difficulty HEENT: normal General:  Well appearing. No resp difficulty HEENT: normal Neck: supple. JVP 7-8. Carotids 2+ bilat; no bruits. No lymphadenopathy or thryomegaly appreciated. Cor: PMI nondisplaced. Regular rate & rhythm. No rubs, gallops or murmurs. Lungs: clear with decreased BS throguhout Abdomen: soft, nontender, nondistended. No hepatosplenomegaly. No bruits or masses. Good bowel sounds. Extremities: no cyanosis, clubbing, rash, edema Neuro: alert & orientedx3, cranial nerves grossly intact. moves all 4 extremities w/o difficulty. Affect pleasant  Telemetry     NSR 70-80s Personally reviewed    Labs    CBC Recent Labs    03/01/19 0841 03/02/19 0213  WBC 12.9* 11.3*  NEUTROABS 11.0* 9.4*  HGB 9.6* 9.0*  HCT 32.0* 29.1*  MCV 82.7 82.2  PLT 249 017   Basic Metabolic  Panel Recent Labs    03/01/19 0841 03/02/19 0213 03/03/19 0223  NA  --  138 139  K  --  3.9 4.2  CL  --  99 98  CO2  --  29 30  GLUCOSE  --  226* 128*  BUN  --  58* 56*  CREATININE  --  1.24* 1.21*  CALCIUM  --  8.8* 9.0  MG 2.3 2.3 2.5*  PHOS 4.3 4.0  --    Liver Function Tests Recent Labs    03/02/19 0213  AST 23  ALT 36  ALKPHOS 106  BILITOT 0.4  PROT 5.7*  ALBUMIN 3.1*   No results for input(s): LIPASE, AMYLASE in the last 72 hours. Cardiac Enzymes No results for input(s): CKTOTAL, CKMB, CKMBINDEX, TROPONINI in the last 72 hours.  BNP: BNP (last 3 results) Recent Labs    04/09/18 1749 02/13/19 1805 02/24/19 0325  BNP 297.8* 1,211.6* 1,344.9*    ProBNP (last 3 results) No results for input(s): PROBNP in the last 8760 hours.   D-Dimer No results for input(s): DDIMER in the last 72 hours. Hemoglobin A1C No results for input(s): HGBA1C in the last 72 hours. Fasting Lipid Panel No results for input(s): CHOL, HDL, LDLCALC, TRIG, CHOLHDL, LDLDIRECT in the last 72 hours. Thyroid Function Tests No results for input(s): TSH, T4TOTAL, T3FREE, THYROIDAB in the last 72 hours.  Invalid input(s):  FREET3  Other results:   Imaging    No results found.   Medications:     Scheduled Medications: . arformoterol  15 mcg Nebulization BID  . atorvastatin  40 mg Oral q1800  . azelastine  1 spray Each Nare BID  . budesonide (PULMICORT) nebulizer solution  0.5 mg Nebulization BID  . docusate sodium  100 mg Oral BID  . furosemide  40 mg Intravenous Q12H  . gabapentin  300 mg Oral TID  . guaiFENesin  600 mg Oral BID  . heparin  5,000 Units Subcutaneous Q8H  . insulin aspart  0-20 Units Subcutaneous TID WC  . insulin aspart  0-5 Units Subcutaneous QHS  . insulin aspart  8 Units Subcutaneous TID WC  . insulin detemir  28 Units Subcutaneous BID  . ipratropium  0.5 mg Nebulization TID  . iron polysaccharides  150 mg Oral BID  . levalbuterol  0.63 mg  Nebulization TID  . macitentan  10 mg Oral Daily  . mouth rinse  15 mL Mouth Rinse BID  . midodrine  5 mg Oral TID WC  . multivitamin with minerals  1 tablet Oral Daily  . potassium chloride  20 mEq Oral BID  . predniSONE  30 mg Oral Q breakfast   Followed by  . [START ON 03/05/2019] predniSONE  20 mg Oral Q breakfast   Followed by  . [START ON 03/07/2019] predniSONE  10 mg Oral Q breakfast  . Selexipag  600 mcg Oral BID  . sertraline  75 mg Oral Daily  . traMADol  50 mg Oral Q6H  . umeclidinium bromide  1 puff Inhalation Daily  . vitamin C  250 mg Oral BID    Infusions: . sodium chloride      PRN Medications: albuterol, HYDROcodone-acetaminophen, metoCLOPramide **OR** metoCLOPramide (REGLAN) injection, morphine injection, ondansetron **OR** ondansetron (ZOFRAN) IV, sodium chloride   Assessment/Plan   1. Acute on chronic hypoxemic respiratory failure: She is now on high flow oxygen.  She is on home oxygen at baseline.  Past PFTs were restrictive, but last CT chest in 6/20 showed emphysema.  She is being treated for AECOPD and is being diuresed for CHF.  - Continue Solumedrol per primary service.  She has completed azithromycin course.  - I have d/w Dr. Elsworth Soho. Unable to wean HF O2 with nasal cannula - Volume status looks good on exam and weight down.  - Will plan RHC today reassess volume status and PA pressures. If volume status stable and PA pressures markedly elevated can do trial of inhaled NO or sildenafil (has failed tadalafil before due to visual changes)  2. 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 Serologies negative. Restrictive PFTs but lung parenchyma looked relatively normal on 1/18 CTA chest. Findings by PFTs and CT not consistent with emphysema. V/Q scan negative for chronic or acute PEs. Serologic workup was negative. RHC (1/19) showed moderate pulmonary arterial hypertension. Echo in 8/18 showed dilated and dysfunctional RV, similar in 11/18 and 9/19. Echo in  1/61 showed PA systolic pressure still very high at 81 mmHg. Sleep study showed nocturnal hypoxemia but not OSA. She wears home oxygen. She is currently on Opsumit and selexipag. Echo in 8/20 showed normal LV EF, moderate RV dilation/moderate dysfunction with D-shaped septum and PASP 80 mmHg, mild AS.  - Continue current dose of selexipag. Intolerant higher doses..  - Continue Opsumit.  - Will plan RHC today reassess volume status and PA pressures. If volume status stable and PA pressures markedly elevated  can do trial of inhaled NO or sildenafil (has failed tadalafil before due to visual changes)  3. Acute on chronic diastolic CHF: With prominent RV failure. - See above. Volume status appears optimized.  Weight at least 5 pounds below previous baseline.  - RHC today 4. CKD: Stage 3. Creatinine 1.21 today 5. CAD: Nonobstructive in 2015. No exertional CP.  - Continue atorvastatin 40 mg daily.  6. Orthostatic hypotension: RV failure may play a role.  -Continuemidodrine 5 mg TID 7. R ankle Fracture, S/P ORIF 11/26/2018.  Now this admission s/p debridement, removal hardware, R ankle fusion.  - PT. Per Dr. Sharol Given.    Length of Stay: [place], [place]  03/03/2019, 11:37 AM  Advanced Heart Failure Team Pager [phone] (M-F; 7a - 4p)  Please contact [facility] for night-coverage after hours (4p -7a ) and weekends on [email or web address]

## 2019-03-04 DIAGNOSIS — I2781 Cor pulmonale (chronic): Secondary | ICD-10-CM

## 2019-03-04 LAB — GLUCOSE, CAPILLARY
Glucose-Capillary: 79 mg/dL (ref 70–99)
Glucose-Capillary: 176 mg/dL — ABNORMAL HIGH (ref 70–99)
Glucose-Capillary: 428 mg/dL — ABNORMAL HIGH (ref 70–99)
Glucose-Capillary: 397 mg/dL — ABNORMAL HIGH (ref 70–99)
Glucose-Capillary: 181 mg/dL — ABNORMAL HIGH (ref 70–99)

## 2019-03-04 LAB — MAGNESIUM: Magnesium: 2.4 mg/dL (ref 1.7–2.4)

## 2019-03-04 NOTE — Progress Notes (Signed)
NAME:  Amy Shepard, MRN:  [id number], DOB:  12/24/1945, LOS: 21 ADMISSION DATE:  02/11/2019, CONSULTATION DATE:  02/21/2019 REFERRING MD:  Dr. Sloan Leiter, CHIEF COMPLAINT:  Hypoxia  Brief History   73 year old female with chronic hypoxic respiratory failure admitted 7/31 for elective repair of nonhealing right trimalleolar ankle fracture s/p hardware removal and fusion on 7/31 by Dr. Sharol Given, being medically managed by [facility].  Post-operatively has had increased and ongoing oxygenation requirements despite optimized medical therapy with IV steroid taper and diuresis and remains on heated high flow unable to wean.  PCCM consulted for further pulmonary recommendations.   Past Medical History  Chronic hypoxic respiratory failure on home oxygen 4L, COPD, CAD, prior non-small cell lung cancer in January 2010 s/p right lower lobe superior segmentectomy with LN dissection and chemotherapy with carboplatin and etopiside, moderate pulmonary hypertension, chronic diastolic heart failure, HTN, HLD, DM, CKD stage III, anemia of chronic disease, right charcot ankle fracture  She is followed by Dr. Aundra Dubin for HF/ pulmonary hypertension  Significant Hospital Events   7/31 Admitted / OR  8/10 PCCM consult.  Add abx, steroids, changed nebs, diuresis  8/11 Afebrile, less wheeze, HFNC 100% / 30L.  Refused BiPAP overnight. Net neg 4.8.  8/12 HFNC 25L, 65% FiO2 8/14 HFNC 20L, 55% FIO2, increased to 60%  8/17 HFNC 20L 50% FiO2  8/18 dropped to 40% 8/19 back to 50% as SpO2 down to 80s  Consults:  [facility]   Procedures:  7/31 OR for Charcot Collapse Right Ankle with Failure Fixation  Significant Diagnostic Tests:  8/3 TTE >> EF 55 to 92%, mod RV systolic dysfx with D shaped intraventricular septum, mild AS, PAS 88 mmHg 8/20 RHC >> RA = 3 RV = 58/5 PA = 59/20 (34) PCW = 10 Fick cardiac output/index = 7.4/4.2 PVR = 3.2 WU FA sat = 99% PA sat = 72%, 73% SVC sat 69%  Micro Data:  8/2 SARS  CoV-2 >> negative 7/31 right ankle tissue cx >> neg  Antimicrobials:  7/30 vanc OR 8/10 azithro >>off  Interim history/subjective:   remains hypoxic on 50%, 24 L high flow RHC results noted from yesterday   Objective   Blood pressure 124/86, pulse 92, temperature 98.2 F (36.8 C), temperature source Oral, resp. rate (!) 24, height _0  (1.575 m), weight 76.4 kg, SpO2 100 %.    FiO2 (%):  [40 %-100 %] 50 %   Intake/Output Summary (Last 24 hours) at 03/04/2019 1217 Last data filed at 03/03/2019 2200 Gross per 24 hour  Intake 780 ml  Output 1250 ml  Net -470 ml   Filed Weights   03/02/19 0320 03/03/19 0632 03/04/19 0315  Weight: 76.2 kg 76.8 kg 76.4 kg   Examination:  General: elderly, obese female. Sitting in recliner.  In NAD     Assessment & Plan:   Acute on chronic hypoxic, hypercapnic respiratory failure (Baseline 4L O2) P:  Attempt to change from high flow  To nasal cannula, aim for saturation 85% Continue IS, Flutter, Pulm hygiene, mobility, PT - have encouraged pt to push IS use and mobilize with PT as much as able  COPD Exacerbation  Hx of NSCLC s/p RLL superior segment resection in 2010, and chemotherapy in 2010. -quit smoking 2008, up to 1.5ppd prior to that -s/p course of azithromycin for AECOPD P: Continue brovana + pumicort BID  Continue Incruse, Alubterol, xopenex Continue pred taper (started 28m 8/18 - drop by 160mq2days)  Acute  on chronic cor pulmonale Pulmonary hypertension. -RHC 07/2017 with moderate PAH with PVR 4.7 WU, preserved cardiac output, near normal filling pressures.  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  Has been managed by CHF team as an outpt for pulmonary hypertension medications  Etiology: consider autoimmune evaluation > pt has sister, brother with the same.  Sister was a smoker brother was not.  Father died of the same. P:  Appreciate heart failure input Continue opsumit, uptravi Ct lasix 40 q 12h, appears well diuresed    Goals of  Care: Limited CODE STATUS issued now, no CPR, no intubation appreciate palliative care discussions  Disposition is home eventually, she has an oxygen tank that goes up to 5 L and will need a different tank that goes up to 10 L PCCM will see again on Monday  Kara Mead MD. [facility]. [facility] Pager [phone] If no response call [phone]     03/04/2019, 12:17 PM

## 2019-03-04 NOTE — Progress Notes (Signed)
Inpatient Diabetes Program Recommendations  AACE/ADA: New Consensus Statement on Inpatient Glycemic Control  Target Ranges:  Prepandial:   less than 140 mg/dL      Peak postprandial:   less than 180 mg/dL (1-2 hours)      Critically ill patients:  140 - 180 mg/dL   Results for BERNADINE, MELECIO (MRN [id number]) as of 03/04/2019 11:39  Ref. Range 03/03/2019 06:21 03/03/2019 11:00 03/03/2019 16:13 03/03/2019 21:57 03/04/2019 06:26  Glucose-Capillary Latest Ref Range: 70 - 99 mg/dL 84  Levemir 28 units 255 (H)  Novolog 19 units 106 (H) 278 (H)  Novolog 3 units  Levemir 28 units 79     Levemir 28 units   Review of Glycemic Control  Current orders for Inpatient glycemic control: Levemir 28 units BID, Novolog 8 units TID with meals for meal coverage, Novolog 0-20 units TID with meals, Novolog 0-5 units QHS; Prednisone 20 mg QAM (with taper)  Inpatient Diabetes Program Recommendations:   Insulin-Meal Coverage: Noted meal coverage was not given with breakfast on 8/20 which is why glucose up to 255 mg/dl at 11am and NO meal coverage given with supper so bedtime glucose up to 278 mg/dl at 21:57.  NURSING: Please administer meal coverage insulin when parameters are met (if patient eats at least 50% of meals and premeal glucose 80 mg/dl or over).  Thanks, Barnie Alderman, RN, MSN, CDE Diabetes Coordinator Inpatient Diabetes Program [phone] (Team Pager from 8am to 5pm)

## 2019-03-04 NOTE — Progress Notes (Signed)
[facility]  PROGRESS NOTE  Amy Shepard LEX:[id number] DOB: Jun 03, 1946 DOA: 02/11/2019 PCP: Maurice Small, MD  Brief History    Amy Shepard is a 73 y.o. year old female with medical history significant for chronic hypoxic respiratory failure secondary to COPD/moderate pulmonary hypertension on oxygen 4 L, CAD, prior non-small cell lung cancer in January 2010 s/p right lower lobe superior segmentectomy, hypertension, chronic diastolic heart failure, HTN, HLD, DM, CKD stage III, anemia of chronic disease, right charcot ankle fracture  who presented on 02/11/2019 with planned elective repair of right trimalleolar fracture by Dr. Sharol Given.  Patient underwent hardware removal and fusion of ankle but postoperatively developed worsening hypoxia prompting medicine consultation.   Patient currently has acute on chronic hypoxic respiratory failure requiring up to 20 L of high flow nasal cannula to maintain normal oxygen saturations.  PCCM is followingDue to concern that this is related to worsening pulmonary hypertension/acute cor pulmonale.  Palliative care is also been consulted to assist with goals of care given poor prognosis  A & P   Acute on chronic hypoxic/hypercapnic respiratory failure, Acute on chronic cor pulmonale Pulmonary hypertension previous baseline 4 L O2 - post operatively has required upwards of 25 L to maintain normal oxygen saturation - Pulmonary consultants will attempt to wean high flow to nasal cannula in and maintain oxygen saturation of 85% -Unclear etiology, right heart cath shows normal left-sided filling pressures,risk of shunting outweighs any benefits and increasing her PAH meds per advanced heart failure consultants -Continue opsumit, uptravi - Encourage incentive for monitoring, flutter valve -Appreciate palliative care recommendations, patient currently partial code - Appears well diuresed, right heart cath shows good left-sided filling pressures,  to  continue IV Lasix  COPD exacerbation  has completed azithromycin course for flare -Continue scheduled inhalers -Continue prednisone taper 10 mg  Type 2 diabetes with hyperglycemia, peripheral neuropathy.  A1c 6.9.  Hyperglycemia worsened in setting of concurrent steroid use, slowly tapering off will expect improvement with that, continue Levemir 28 units twice daily, sliding scale as needed, scheduled short-acting 3 times daily with meals  Anemia of chronic disease (CKD), hemoglobin stable.  Transfuse for hemoglobin less than 7 no active signs of bleeding.  CKD stage III.  1.2-1.6.  Creatinine currently stable.  Continue to monitor output, avoid nephrotoxins.  Orthostatic hypotension, continue home midodrine  Depression, stable continue Zoloft  Nonhealing right trimalleolar ankle fracture status post hardware removal and fusion on 7/31 by Dr. Sharol Given    DVT prophylaxis: Heparin Code Status: Partial, changed 8/19 after discussion with palliative care Family Communication: No family at bedside Disposition Plan: Attempt to wean from high flow to nasal cannula and maintain appropriate oxygen saturation, , treating with continue IV Lasix diuresis.     [facility] Direct contact: see [email or web address] (further directions at bottom of note if needed) 7PM-7AM contact night coverage as at bottom of note 03/04/2019, 7:57 AM  LOS: 21 days   Consultants  . AHF, PCCM, Palliative  Procedures  02/11/2019-removal of deep hardware right ankle and right ankle fusion by Dr. Sharol Given.  03/03/2019, right heart cath: Excellent diuresis with normal left-sided filling pressures 2. Mild PAH on therapy 3. No evidence of intracardiac shunting  Antibiotics  .   Interval History/Subjective  Has no acute complaints.  Feels her breathing is similar to yesterday's.  Denies any chest pain.  Objective   Vitals:  Vitals:   03/04/19 0022 03/04/19 0300  BP: 101/60 125/60  Pulse: 72 65  Resp: 19 12  Temp: 98.5 F (36.9 C) 98.4 F (36.9 C)  SpO2: 92% 96%    Exam:  Awake Alert, Oriented X 3, No new F.N deficits, Normal affect Baring.AT,PERRAL Supple Neck,No JVD appreciated,   Symmetrical Chest wall movement, clear breath sounds throughout, diminished breath sounds globally, CTAB, able to speak in complete sentences maintaining O2 sats greater than 90% on 25 L high flow nasal cannula RRR,No Gallops,Rubs or new Murmurs, No Parasternal Heave +ve B.Sounds, Abd Soft, No tenderness, No organomegaly appriciated, No rebound - guarding or rigidity. No Cyanosis, Clubbing or edema, No new Rash or bruise    I have personally reviewed the following:   Data Reviewed: Basic Metabolic Panel: Recent Labs  Lab 02/26/19 0208 02/27/19 0233 02/28/19 0234 03/01/19 0237 03/01/19 0841 03/02/19 0213 03/03/19 0223 03/03/19 1401 03/03/19 1407 03/04/19 0152  NA 139 139 137 136  --  138 139 137  143 142  --   K 3.8 4.1 4.0 3.9  --  3.9 4.2 4.4  3.8 4.0  --   CL 99 98 96* 96*  --  99 98  --   --   --   CO2 _0 --  29 30  --   --   --   GLUCOSE 195* 119* 281* 261*  --  226* 128*  --   --   --   BUN 61* 63* 59* 61*  --  58* 56*  --   --   --   CREATININE 1.18* 1.34* 1.30* 1.36*  --  1.24* 1.21*  --   --   --   CALCIUM 9.5 9.1 8.8* 8.8*  --  8.8* 9.0  --   --   --   MG 2.4 2.3 2.3  --  2.3 2.3 2.5*  --   --  2.4  PHOS 3.9 3.6 3.5  --  4.3 4.0  --   --   --   --    Liver Function Tests: Recent Labs  Lab 02/26/19 0208 02/27/19 0233 02/28/19 0234 03/02/19 0213  AST 27 33 29 23  ALT 32 38 40 36  ALKPHOS 142* 136* 122 106  BILITOT 0.4 0.6 0.4 0.4  PROT 6.8 6.0* 6.0* 5.7*  ALBUMIN 3.4* 3.2* 3.1* 3.1*   No results for input(s): LIPASE, AMYLASE in the last 168 hours. No results for input(s): AMMONIA in the last 168 hours. CBC: Recent Labs  Lab 02/26/19 0208 02/27/19 0233 02/28/19 0234 03/01/19 0841 03/02/19 0213 03/03/19 1401 03/03/19 1407  WBC 13.4* 13.0* 11.0* 12.9* 11.3*   --   --   NEUTROABS 11.4* 10.9* 10.0* 11.0* 9.4*  --   --   HGB 8.8* 8.9* 8.8* 9.6* 9.0* 9.9*  8.5* 8.8*  HCT 29.9* 30.1* 28.6* 32.0* 29.1* 29.0*  25.0* 26.0*  MCV 83.5 83.4 82.7 82.7 82.2  --   --   PLT 296 302 239 249 218  --   --    Cardiac Enzymes: No results for input(s): CKTOTAL, CKMB, CKMBINDEX, TROPONINI in the last 168 hours. BNP (last 3 results) Recent Labs    04/09/18 1749 02/13/19 1805 02/24/19 0325  BNP 297.8* 1,211.6* 1,344.9*    ProBNP (last 3 results) No results for input(s): PROBNP in the last 8760 hours.  CBG: Recent Labs  Lab 03/03/19 0621 03/03/19 1100 03/03/19 1613 03/03/19 2157 03/04/19 0626  GLUCAP 84 255* 106* 278* 79    No results found for this or any previous visit (from the past 240  hour(s)).   Studies: No results found.  Scheduled Meds: . arformoterol  15 mcg Nebulization BID  . atorvastatin  40 mg Oral q1800  . azelastine  1 spray Each Nare BID  . budesonide (PULMICORT) nebulizer solution  0.5 mg Nebulization BID  . docusate sodium  100 mg Oral BID  . furosemide  40 mg Intravenous Q12H  . gabapentin  300 mg Oral TID  . guaiFENesin  600 mg Oral BID  . heparin  5,000 Units Subcutaneous Q8H  . insulin aspart  0-20 Units Subcutaneous TID WC  . insulin aspart  0-5 Units Subcutaneous QHS  . insulin aspart  8 Units Subcutaneous TID WC  . insulin detemir  28 Units Subcutaneous BID  . ipratropium  0.5 mg Nebulization TID  . iron polysaccharides  150 mg Oral BID  . levalbuterol  0.63 mg Nebulization TID  . macitentan  10 mg Oral Daily  . mouth rinse  15 mL Mouth Rinse BID  . midodrine  5 mg Oral TID WC  . multivitamin with minerals  1 tablet Oral Daily  . potassium chloride  20 mEq Oral BID  . [START ON 03/05/2019] predniSONE  20 mg Oral Q breakfast   Followed by  . [START ON 03/07/2019] predniSONE  10 mg Oral Q breakfast  . Selexipag  600 mcg Oral BID  . sertraline  75 mg Oral Daily  . sodium chloride flush  3 mL Intravenous Q12H   . traMADol  50 mg Oral Q6H  . umeclidinium bromide  1 puff Inhalation Daily  . vitamin C  250 mg Oral BID   Continuous Infusions: . 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

## 2019-03-04 NOTE — Progress Notes (Addendum)
Patient ID: Amy Shepard, female   DOB: 08-Sep-1945, 73 y.o.   MRN: [id number]      Advanced Heart Failure Rounding Note  PCP-Cardiologist: No primary care provider on file.   Subjective:    Remains on HFNC at 25L. With sats in low 90s. Feels OK. Denies SOB, orthopnea or PND  RHC yesterday with mild PAH and well compensated filling pressures    Objective:   Weight Range: 76.4 kg Body mass index is 30.81 kg/m.   Vital Signs:   Temp:  [97.9 F (36.6 C)-98.6 F (37 C)] 98.4 F (36.9 C) (08/21 0300) Pulse Rate:  [42-98] 67 (08/21 0804) Resp:  [6-25] 14 (08/21 0804) BP: (101-144)/(48-77) 125/60 (08/21 0300) SpO2:  [85 %-100 %] 100 % (08/21 0804) FiO2 (%):  [40 %-100 %] 50 % (08/21 0804) Weight:  [76.4 kg] 76.4 kg (08/21 0315) Last BM Date: 03/03/19  Weight change: Filed Weights   03/02/19 0320 03/03/19 0632 03/04/19 0315  Weight: 76.2 kg 76.8 kg 76.4 kg    Intake/Output:   Intake/Output Summary (Last 24 hours) at 03/04/2019 0819 Last data filed at 03/03/2019 2200 Gross per 24 hour  Intake 1260 ml  Output 2450 ml  Net -1190 ml      Physical Exam    General:  Sitting in chair on HFNC. No resp difficulty HEENT: normal Neck: supple. no JVD. Carotids 2+ bilat; no bruits. No lymphadenopathy or thryomegaly appreciated. Cor: PMI nondisplaced. Regular rate & rhythm. No rubs, gallops or murmurs. Lungs: decreased BS throughout Abdomen: soft, nontender, nondistended. No hepatosplenomegaly. No bruits or masses. Good bowel sounds. Extremities: no cyanosis, clubbing, rash, edema Neuro: alert & orientedx3, cranial nerves grossly intact. moves all 4 extremities w/o difficulty. Affect pleasant   Telemetry     NSR 60-70s Personally reviewed   Labs    CBC Recent Labs    03/01/19 0841 03/02/19 0213 03/03/19 1401 03/03/19 1407  WBC 12.9* 11.3*  --   --   NEUTROABS 11.0* 9.4*  --   --   HGB 9.6* 9.0* 9.9*  8.5* 8.8*  HCT 32.0* 29.1* 29.0*  25.0* 26.0*  MCV  82.7 82.2  --   --   PLT 249 218  --   --    Basic Metabolic Panel Recent Labs    03/01/19 0841  03/02/19 0213 03/03/19 0223 03/03/19 1401 03/03/19 1407 03/04/19 0152  NA  --    < > 138 139 137  143 142  --   K  --    < > 3.9 4.2 4.4  3.8 4.0  --   CL  --   --  99 98  --   --   --   CO2  --   --  29 30  --   --   --   GLUCOSE  --   --  226* 128*  --   --   --   BUN  --   --  58* 56*  --   --   --   CREATININE  --   --  1.24* 1.21*  --   --   --   CALCIUM  --   --  8.8* 9.0  --   --   --   MG 2.3  --  2.3 2.5*  --   --  2.4  PHOS 4.3  --  4.0  --   --   --   --    < > = values in  this interval not displayed.   Liver Function Tests Recent Labs    03/02/19 0213  AST 23  ALT 36  ALKPHOS 106  BILITOT 0.4  PROT 5.7*  ALBUMIN 3.1*   No results for input(s): LIPASE, AMYLASE in the last 72 hours. Cardiac Enzymes No results for input(s): CKTOTAL, CKMB, CKMBINDEX, TROPONINI in the last 72 hours.  BNP: BNP (last 3 results) Recent Labs    04/09/18 1749 02/13/19 1805 02/24/19 0325  BNP 297.8* 1,211.6* 1,344.9*    ProBNP (last 3 results) No results for input(s): PROBNP in the last 8760 hours.   D-Dimer No results for input(s): DDIMER in the last 72 hours. Hemoglobin A1C No results for input(s): HGBA1C in the last 72 hours. Fasting Lipid Panel No results for input(s): CHOL, HDL, LDLCALC, TRIG, CHOLHDL, LDLDIRECT in the last 72 hours. Thyroid Function Tests No results for input(s): TSH, T4TOTAL, T3FREE, THYROIDAB in the last 72 hours.  Invalid input(s): FREET3  Other results:   Imaging    No results found.   Medications:     Scheduled Medications: . arformoterol  15 mcg Nebulization BID  . atorvastatin  40 mg Oral q1800  . azelastine  1 spray Each Nare BID  . budesonide (PULMICORT) nebulizer solution  0.5 mg Nebulization BID  . docusate sodium  100 mg Oral BID  . furosemide  40 mg Intravenous Q12H  . gabapentin  300 mg Oral TID  . guaiFENesin  600  mg Oral BID  . heparin  5,000 Units Subcutaneous Q8H  . insulin aspart  0-20 Units Subcutaneous TID WC  . insulin aspart  0-5 Units Subcutaneous QHS  . insulin aspart  8 Units Subcutaneous TID WC  . insulin detemir  28 Units Subcutaneous BID  . ipratropium  0.5 mg Nebulization TID  . iron polysaccharides  150 mg Oral BID  . levalbuterol  0.63 mg Nebulization TID  . macitentan  10 mg Oral Daily  . mouth rinse  15 mL Mouth Rinse BID  . midodrine  5 mg Oral TID WC  . multivitamin with minerals  1 tablet Oral Daily  . potassium chloride  20 mEq Oral BID  . [START ON 03/05/2019] predniSONE  20 mg Oral Q breakfast   Followed by  . [START ON 03/07/2019] predniSONE  10 mg Oral Q breakfast  . Selexipag  600 mcg Oral BID  . sertraline  75 mg Oral Daily  . sodium chloride flush  3 mL Intravenous Q12H  . traMADol  50 mg Oral Q6H  . umeclidinium bromide  1 puff Inhalation Daily  . vitamin C  250 mg Oral BID    Infusions: . sodium chloride      PRN Medications: sodium chloride, acetaminophen, albuterol, HYDROcodone-acetaminophen, metoCLOPramide **OR** metoCLOPramide (REGLAN) injection, morphine injection, ondansetron **OR** ondansetron (ZOFRAN) IV, ondansetron (ZOFRAN) IV, sodium chloride, sodium chloride flush   Assessment/Plan   1. Acute on chronic hypoxemic respiratory failure: She is now on high flow oxygen.  She is on home oxygen at baseline.  Past PFTs were restrictive, but last CT chest in 6/20 showed emphysema.  She is being treated for AECOPD and is being diuresed for CHF.  - RHC yesterday with mow wedge and mild PAH doubt we can improve her with further diuresis. And I think risk of shunting with increasing increasing PAH meds outweighs benefias in light of current hemodynamics - ? Utility of repeat chest CT 2. 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 Serologies negative. Restrictive PFTs but lung parenchyma  looked relatively normal on 1/18 CTA chest. Findings by PFTs and CT  not consistent with emphysema. V/Q scan negative for chronic or acute PEs. Serologic workup was negative. RHC (1/19) showed moderate pulmonary arterial hypertension. Echo in 8/18 showed dilated and dysfunctional RV, similar in 11/18 and 9/19. Echo in 9/44 showed PA systolic pressure still very high at 81 mmHg. Sleep study showed nocturnal hypoxemia but not OSA. She wears home oxygen. She is currently on Opsumit and selexipag. Echo in 8/20 showed normal LV EF, moderate RV dilation/moderate dysfunction with D-shaped septum and PASP 80 mmHg, mild AS.  - Continue current dose of selexipag. Intolerant higher doses..  - Continue Opsumit.  - See comments above 3. Acute on chronic diastolic CHF: With prominent RV failure. - See above. Volume status optimized.   4. CKD: Stage 3. Creatinine 1.21 yesterday 5. CAD: Nonobstructive in 2015. No s/s ischemia - Continue atorvastatin 40 mg daily.  6. Orthostatic hypotension: RV failure may play a role.  -Continuemidodrine 5 mg TID 7. R ankle Fracture, S/P ORIF 11/26/2018.  Now this admission s/p debridement, removal hardware, R ankle fusion.  - PT. Per Dr. Sharol Given.   We have little else to add at this point. We will sign off. Please call with questions. D/w Dr. Elsworth Soho.  Length of Stay: 21  Glori Bickers, MD  03/04/2019, 8:19 AM  Advanced Heart Failure Team Pager [phone] (M-F; 7a - 4p)  Please contact [facility] for night-coverage after hours (4p -7a ) and weekends on [email or web address]

## 2019-03-04 NOTE — Progress Notes (Signed)
Dr. Elsworth Soho wanted to try patient off heated high flow and on salter high flow at 15L. Patient tolerating well at this time. RN made aware. RT will continue to monitor.

## 2019-03-04 NOTE — Progress Notes (Signed)
Subjective: 1 Day Post-Op Procedure(s) (LRB): RIGHT HEART CATH (N/A) Patient reports pain as 0 on 0-10 scale.   Reports she is feeling well and feels the same as she did prior to surgery.  Fio2 50% with O2 sats 97-100 during exam.   Objective: Vital signs in last 24 hours: Temp:  [97.9 F (36.6 C)-98.6 F (37 C)] 98.4 F (36.9 C) (08/21 0300) Pulse Rate:  [42-98] 67 (08/21 0804) Resp:  [6-25] 14 (08/21 0804) BP: (101-144)/(48-77) 125/60 (08/21 0300) SpO2:  [85 %-100 %] 100 % (08/21 0804) FiO2 (%):  [40 %-100 %] 50 % (08/21 0804) Weight:  [76.4 kg] 76.4 kg (08/21 0315)  Intake/Output from previous day: 08/20 0701 - 08/21 0700 In: 1260 [P.O.:1260] Out: 2450 [Urine:2450] Intake/Output this shift: No intake/output data recorded.  Recent Labs    03/02/19 0213 03/03/19 1401 03/03/19 1407  HGB 9.0* 9.9*  8.5* 8.8*   Recent Labs    03/02/19 0213 03/03/19 1401 03/03/19 1407  WBC 11.3*  --   --   RBC 3.54*  --   --   HCT 29.1* 29.0*  25.0* 26.0*  PLT 218  --   --    Recent Labs    03/02/19 0213 03/03/19 0223 03/03/19 1401 03/03/19 1407  NA 138 139 137  143 142  K 3.9 4.2 4.4  3.8 4.0  CL 99 98  --   --   CO2 29 30  --   --   BUN 58* 56*  --   --   CREATININE 1.24* 1.21*  --   --   GLUCOSE 226* 128*  --   --   CALCIUM 8.8* 9.0  --   --    No results for input(s): LABPT, INR in the last 72 hours.  Right ankle healing well and without signs of infection.    Assessment/Plan: 1 Day Post-Op Procedure(s) (LRB): RIGHT HEART CATH (N/A) Right ankle fusion- POD # 21- Continue fx boot . DC sutures next week.  Acute on chronic respiratory failure- Management per Dr. Haroldine Laws, Dr. Elsworth Soho and Dr. Lonny Prude. Appreciate consultants care.    Erlinda Hong, PA-C 03/04/2019, 10:03 AM  [facility] [phone]

## 2019-03-05 DIAGNOSIS — J439 Emphysema, unspecified: Secondary | ICD-10-CM

## 2019-03-05 LAB — BASIC METABOLIC PANEL
Anion gap: 9 (ref 5–15)
BUN: 46 mg/dL — ABNORMAL HIGH (ref 8–23)
CO2: 32 mmol/L (ref 22–32)
Calcium: 8.9 mg/dL (ref 8.9–10.3)
Chloride: 96 mmol/L — ABNORMAL LOW (ref 98–111)
Creatinine, Ser: 1.14 mg/dL — ABNORMAL HIGH (ref 0.44–1.00)
GFR calc Af Amer: 56 mL/min — ABNORMAL LOW (ref >60)
GFR calc non Af Amer: 48 mL/min — ABNORMAL LOW (ref >60)
Glucose, Bld: 51 mg/dL — ABNORMAL LOW (ref 70–99)
Potassium: 3.9 mmol/L (ref 3.5–5.1)
Sodium: 137 mmol/L (ref 135–145)

## 2019-03-05 LAB — GLUCOSE, CAPILLARY
Glucose-Capillary: 51 mg/dL — ABNORMAL LOW (ref 70–99)
Glucose-Capillary: 51 mg/dL — ABNORMAL LOW (ref 70–99)
Glucose-Capillary: 62 mg/dL — ABNORMAL LOW (ref 70–99)
Glucose-Capillary: 263 mg/dL — ABNORMAL HIGH (ref 70–99)
Glucose-Capillary: 310 mg/dL — ABNORMAL HIGH (ref 70–99)
Glucose-Capillary: 194 mg/dL — ABNORMAL HIGH (ref 70–99)

## 2019-03-05 LAB — MAGNESIUM: Magnesium: 2.4 mg/dL (ref 1.7–2.4)

## 2019-03-05 MED ORDER — INSULIN DETEMIR 100 UNIT/ML ~~LOC~~ SOLN
23.0000 [IU] | Freq: Two times a day (BID) | SUBCUTANEOUS | Status: DC
  Administered 2019-03-05 – 2019-03-06 (×4): 23 [IU] via SUBCUTANEOUS
  Filled 2019-03-05 (×6): qty 0.23

## 2019-03-05 NOTE — Progress Notes (Addendum)
Vitals stable, denies distress and pain, oxygen continue _0  via HFNC, sat in a recliner pretty much all day, will continue to monitor the patient  Palma Holter, RN

## 2019-03-05 NOTE — Progress Notes (Signed)
[facility]  PROGRESS NOTE  Amy Shepard [id number]:[id number] DOB: 03-12-1946 DOA: 02/11/2019 PCP: Maurice Small, MD  Brief History    Amy Shepard is a 73 y.o. year old female with medical history significant for chronic hypoxic respiratory failure secondary to COPD/moderate pulmonary hypertension on oxygen 4 L, CAD, prior non-small cell lung cancer in January 2010 s/p right lower lobe superior segmentectomy, hypertension, chronic diastolic heart failure, HTN, HLD, DM, CKD stage III, anemia of chronic disease, right charcot ankle fracture  who presented on 02/11/2019 with planned elective repair of right trimalleolar fracture by Dr. Sharol Given.  Patient underwent hardware removal and fusion of ankle but postoperatively developed worsening hypoxia prompting medicine consultation.   Patient currently has acute on chronic hypoxic respiratory failure requiring up to 20 L of high flow nasal cannula to maintain normal oxygen saturations.  PCCM is followingDue to concern that this is related to worsening pulmonary hypertension/acute cor pulmonale.  Palliative care is also been consulted to assist with goals of care given poor prognosis  A & P   Acute on chronic hypoxic/hypercapnic respiratory failure, improving Acute on chronic cor pulmonale Pulmonary hypertension previous baseline 4 L O2, now down to 5 L HFNC - post operatively has required upwards of 25 L to maintain normal oxygen saturation - Pulmonary consultants will attempt to wean high flow to nasal cannula in and maintain oxygen saturation of 85% -Unclear etiology, right heart cath shows normal left-sided filling pressures,risk of shunting outweighs any benefits and increasing her PAH meds per advanced heart failure consultants -Continue opsumit, uptravi(pt states was informed by pharmacy she has only a few tabs of her supply left) - Encourage incentive for monitoring, flutter valve -Appreciate palliative care recommendations, patient  currently partial code - Appears well diuresed, right heart cath shows good left-sided filling pressures,  Would recommend continuing IV lasix for additional day while weaning O2,   COPD exacerbation  has completed azithromycin course for flare -Continue scheduled inhalers -Continue prednisone taper 10 mg decrease/day  Type 2 diabetes with hyperglycemia, peripheral neuropathy.  A1c 6.9.  Hyperglycemia worsened in setting of concurrent steroid use, however had hypoglycemia on morning. Will decrease levemir from 28 U to 23U.  Expect will be able to decrease short acting as well given prednisone taper will lower post prandial hyperglycemia, sliding scale as needed, scheduled short-acting 3 times daily with meals  Anemia of chronic disease (CKD), hemoglobin stable.  Transfuse for hemoglobin less than 7 no active signs of bleeding.  CKD stage III.  1.2-1.6.  Creatinine currently stable.  Continue to monitor output, avoid nephrotoxins.  Orthostatic hypotension, continue home midodrine  Depression, stable continue Zoloft  Nonhealing right trimalleolar ankle fracture status post hardware removal and fusion on 7/31 by Dr. Sharol Given    DVT prophylaxis: Heparin Code Status: Partial, changed 8/19 after discussion with palliative care Family Communication: No family at bedside Disposition Plan: Weaning from high flow to nasal cannula and maintain appropriate oxygen saturation, , treating with continue IV Lasix diuresis per pulmonary.     [facility] Direct contact: see [email or web address] (further directions at bottom of note if needed) 7PM-7AM contact night coverage as at bottom of note 03/05/2019, 8:29 AM  LOS: 22 days   Consultants  . AHF, PCCM, Palliative  Procedures  02/11/2019-removal of deep hardware right ankle and right ankle fusion by Dr. Sharol Given.  03/03/2019, right heart cath: Excellent diuresis with normal left-sided filling pressures 2. Mild PAH on therapy 3. No evidence of  intracardiac shunting  Antibiotics  .   Interval History/Subjective  Has no acute complaints.  Feels her breathing is stable.   Objective   Vitals:  Vitals:   03/05/19 0811 03/05/19 0815  BP:    Pulse:    Resp:    Temp:    SpO2: 95% 96%    Exam:  Awake Alert, Oriented X 3, No new F.N deficits, Normal affect [name].AT,PERRAL Supple Neck,No JVD appreciated,   Symmetrical Chest wall movement, clear breath sounds throughout, diminished breath sounds globally, CTAB, able to speak in complete sentences maintaining O2 sats greater than 90% on 5 L high flow nasal cannula RRR,No Gallops,Rubs or new Murmurs, No Parasternal Heave +ve B.Sounds, Abd Soft, No tenderness, No organomegaly appriciated, No rebound - guarding or rigidity. No Cyanosis, Clubbing or edema, No new Rash or bruise    I have personally reviewed the following:   Data Reviewed: Basic Metabolic Panel: Recent Labs  Lab 02/27/19 0233 02/28/19 0234 03/01/19 0237 03/01/19 0841 03/02/19 0213 03/03/19 0223 03/03/19 1401 03/03/19 1407 03/04/19 0152 03/05/19 0234  NA 139 137 136  --  138 139 137  143 142  --  137  K 4.1 4.0 3.9  --  3.9 4.2 4.4  3.8 4.0  --  3.9  CL 98 96* 96*  --  99 98  --   --   --  96*  CO2 _0 --  29 30  --   --   --  32  GLUCOSE 119* 281* 261*  --  226* 128*  --   --   --  51*  BUN 63* 59* 61*  --  58* 56*  --   --   --  46*  CREATININE 1.34* 1.30* 1.36*  --  1.24* 1.21*  --   --   --  1.14*  CALCIUM 9.1 8.8* 8.8*  --  8.8* 9.0  --   --   --  8.9  MG 2.3 2.3  --  2.3 2.3 2.5*  --   --  2.4 2.4  PHOS 3.6 3.5  --  4.3 4.0  --   --   --   --   --    Liver Function Tests: Recent Labs  Lab 02/27/19 0233 02/28/19 0234 03/02/19 0213  AST 33 29 23  ALT 38 40 36  ALKPHOS 136* 122 106  BILITOT 0.6 0.4 0.4  PROT 6.0* 6.0* 5.7*  ALBUMIN 3.2* 3.1* 3.1*   No results for input(s): LIPASE, AMYLASE in the last 168 hours. No results for input(s): AMMONIA in the last 168 hours. CBC:  Recent Labs  Lab 02/27/19 0233 02/28/19 0234 03/01/19 0841 03/02/19 0213 03/03/19 1401 03/03/19 1407  WBC 13.0* 11.0* 12.9* 11.3*  --   --   NEUTROABS 10.9* 10.0* 11.0* 9.4*  --   --   HGB 8.9* 8.8* 9.6* 9.0* 9.9*  8.5* 8.8*  HCT 30.1* 28.6* 32.0* 29.1* 29.0*  25.0* 26.0*  MCV 83.4 82.7 82.7 82.2  --   --   PLT 302 239 249 218  --   --    Cardiac Enzymes: No results for input(s): CKTOTAL, CKMB, CKMBINDEX, TROPONINI in the last 168 hours. BNP (last 3 results) Recent Labs    04/09/18 1749 02/13/19 1805 02/24/19 0325  BNP 297.8* 1,211.6* 1,344.9*    ProBNP (last 3 results) No results for input(s): PROBNP in the last 8760 hours.  CBG: Recent Labs  Lab 03/04/19 1822 03/04/19 2125 03/05/19 0617 03/05/19 7062  03/05/19 0647  GLUCAP 397* 181* 51* 51* 62*    No results found for this or any previous visit (from the past 240 hour(s)).   Studies: No results found.  Scheduled Meds: . arformoterol  15 mcg Nebulization BID  . atorvastatin  40 mg Oral q1800  . azelastine  1 spray Each Nare BID  . budesonide (PULMICORT) nebulizer solution  0.5 mg Nebulization BID  . docusate sodium  100 mg Oral BID  . furosemide  40 mg Intravenous Q12H  . gabapentin  300 mg Oral TID  . guaiFENesin  600 mg Oral BID  . heparin  5,000 Units Subcutaneous Q8H  . insulin aspart  0-20 Units Subcutaneous TID WC  . insulin aspart  0-5 Units Subcutaneous QHS  . insulin aspart  8 Units Subcutaneous TID WC  . insulin detemir  28 Units Subcutaneous BID  . ipratropium  0.5 mg Nebulization TID  . iron polysaccharides  150 mg Oral BID  . levalbuterol  0.63 mg Nebulization TID  . macitentan  10 mg Oral Daily  . mouth rinse  15 mL Mouth Rinse BID  . midodrine  5 mg Oral TID WC  . multivitamin with minerals  1 tablet Oral Daily  . potassium chloride  20 mEq Oral BID  . predniSONE  20 mg Oral Q breakfast   Followed by  . [START ON 03/07/2019] predniSONE  10 mg Oral Q breakfast  . Selexipag  600 mcg  Oral BID  . sertraline  75 mg Oral Daily  . sodium chloride flush  3 mL Intravenous Q12H  . traMADol  50 mg Oral Q6H  . umeclidinium bromide  1 puff Inhalation Daily  . vitamin C  250 mg Oral BID   Continuous Infusions: . 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

## 2019-03-05 NOTE — Progress Notes (Signed)
Patient ID: Amy Shepard, female   DOB: 1946/05/09, 73 y.o.   MRN: [id number] Patient doing well this AM, with no complaints.Right ankle dressing clean dry and intact. Sensation intact to light touch right foot. Right calf supple and non tender.

## 2019-03-05 NOTE — Progress Notes (Addendum)
Hypoglycemic Event - 0608  CBG: 51  Treatment: Received 8 oz of juice + graham crackers  Symptoms: Patient alert and oriented - no alterations in mentation or difficulty with awakening patient  Follow-up CBG: Time:0625 CBG Result: 51 > 62   Patient received additional 8 oz. of juice + pt received breakfast tray and is now eating    Comments/MD notified: [place] notifed    Horatio Bertz N Ellizabeth Dacruz

## 2019-03-05 NOTE — Progress Notes (Signed)
Provided message from pharmacy to patient regarding patient's home stocked medication. Message noted that supply was running low and needed replacement.  Patient made aware and stated she would have family bring more.

## 2019-03-06 LAB — GLUCOSE, CAPILLARY
Glucose-Capillary: 133 mg/dL — ABNORMAL HIGH (ref 70–99)
Glucose-Capillary: 103 mg/dL — ABNORMAL HIGH (ref 70–99)
Glucose-Capillary: 239 mg/dL — ABNORMAL HIGH (ref 70–99)
Glucose-Capillary: 150 mg/dL — ABNORMAL HIGH (ref 70–99)

## 2019-03-06 LAB — MAGNESIUM: Magnesium: 2.5 mg/dL — ABNORMAL HIGH (ref 1.7–2.4)

## 2019-03-06 NOTE — Progress Notes (Addendum)
Vitals stable, denies distress and pain, oxygen continue _0  via HFNC, Dressing changed done for her right ankle surgical wound, site is Clean, dry and intact. pt sat in a recliner pretty much all day, will continue to monitor the patient Per pharmacy, they will take care of pt's home medicine  Selexipag, as pt is running out of that medicine at home.  Palma Holter, RN

## 2019-03-06 NOTE — Plan of Care (Signed)

## 2019-03-06 NOTE — Progress Notes (Addendum)
[facility]  PROGRESS NOTE  Vonnie Spagnolo Brizzi HEK:[id number] DOB: 1945/09/08 DOA: 02/11/2019 PCP: Maurice Small, MD  Brief History    Amy Shepard is a 73 y.o. year old female with medical history significant for chronic hypoxic respiratory failure secondary to COPD/moderate pulmonary hypertension on oxygen 4 L, CAD, prior non-small cell lung cancer in January 2010 s/p right lower lobe superior segmentectomy, hypertension, chronic diastolic heart failure, HTN, HLD, DM, CKD stage III, anemia of chronic disease, right charcot ankle fracture  who presented on 02/11/2019 and underwent planned elective repair of right trimalleolar fracture by Dr. Sharol Given.  Patient underwent hardware removal and fusion of ankle but postoperatively on 8/2  developed worsening hypoxia with pulse oximetry in low 40s requiring 15 L nonrebreather mask to improve to high 70s with labored breathing. She was ultimately transitioned to 15 L HFNC with her oxygen saturation improving to low 90s.    Hospitalist service was consulted on 8/2 due to hypoxia noted above.  During hospital course patient's postoperative respiratory failure has been treated as COPD exacerbation with acute exacerbation of diastolic CHF in setting of known pulmonary hypertension.  Her COPD exacerbation was treated with IV steroids and aggressive bronchodilator therapy with assistance of pulmonary consultants as well and now undergoing prednisone taper.  Her CHF was treated with the assistance of advanced heart failure with continued IV diuresis.   Regarding her Pulmonary HTN she underwent RHC on 8/20 that showed mild PAH with well compensated filling pressures per AHF.   The risk of shunting with increasing her PAH meds the risk outweigh the benefits per advanced heart failure recommendations.  Patient continued her home [place] med regimen.   Palliative care was also  consulted to assist with goals of care given poor prognosis. Patient changed code status  to Partial .  Have been able to wean patient to 5 L HFNC from upwards of 25 on 8/22  A & P   Acute on chronic hypoxic/hypercapnic respiratory failure, improving Acute on chronic cor pulmonale Pulmonary hypertension previous baseline 4 L O2, now down to 5 L HFNC - post operatively has required upwards of 25 L to maintain normal oxygen saturation - Pulmonary consultants recommend continuing to wean from high flow to nasal cannula  and maintain oxygen saturation of 85% -Unclear etiology, right heart cath shows normal left-sided filling pressures,risk of shunting outweighs any benefits and increasing her PAH meds per advanced heart failure consultants -Continue opsumit, uptravi(pt states was informed by pharmacy she has only a few tabs of her supply left) - Encourage incentive for monitoring, flutter valve -Appreciate palliative care recommendations, patient currently partial code - Appears well diuresed, right heart cath shows good left-sided filling pressures,  Would recommend continuing IV lasix for additional day while weaning O2 defer to pulmonary consultants   COPD exacerbation , improving has completed azithromycin course for flare. No active wheeze on exam -Continue scheduled inhalers -Continue prednisone taper 10 mg decrease/day, last dose 8/25  Type 2 diabetes with hyperglycemia, peripheral neuropathy.  A1c 6.9.  Hyperglycemia worsened in setting of concurrent steroid use, No recurrent hypoglycemia on reduced dose of levemir from 23U.  Expect will be able to decrease short acting as well given prednisone taper will lower post prandial hyperglycemia, sliding scale as needed, scheduled short-acting 3 times daily with meals  Anemia of chronic disease (CKD), hemoglobin, stable.  Transfuse for hemoglobin less than 7 no active signs of bleeding.  CKD stage III.  1.2-1.6.  Creatinine currently stable.  Continue to monitor output, avoid nephrotoxins.  Orthostatic hypotension, continue  home midodrine  Depression, stable continue Zoloft  Nonhealing right trimalleolar ankle fracture status post hardware removal and fusion on 7/31 by Dr. Sharol Given    DVT prophylaxis: Heparin Code Status: Partial, changed 8/19 after discussion with palliative care Family Communication: No family at bedside Disposition Plan: Weaning from high flow to nasal cannula and maintain appropriate oxygen saturation, , treating with continue IV Lasix diuresis per pulmonary.     [facility] Direct contact: see [email or web address] (further directions at bottom of note if needed) 7PM-7AM contact night coverage as at bottom of note 03/06/2019, 3:09 PM  LOS: 23 days   Consultants   AHF, PCCM, Palliative, TRH  Procedures  02/11/2019-removal of deep hardware right ankle and right ankle fusion by Dr. Sharol Given.  03/03/2019, right heart cath: Excellent diuresis with normal left-sided filling pressures 2. Mild PAH on therapy 3. No evidence of intracardiac shunting  Antibiotics     Interval History/Subjective  Happy about her breathing  Objective   Vitals:  Vitals:   03/06/19 1200 03/06/19 1457  BP: (!) 123/104   Pulse: (!) 112   Resp:    Temp: 97.6 F (36.4 C)   SpO2: 92% 94%    Exam:  Awake Alert, Oriented X 3, No new F.N deficits, Normal affect [place].AT,PERRAL Supple Neck,No JVD appreciated,   Symmetrical Chest wall movement, clear breath sounds throughout, diminished breath sounds at bases, CTAB, able to speak in complete sentences maintaining O2 sats greater than 90% on 5 L high flow nasal cannula RRR,No Gallops,Rubs or new Murmurs, No edema +ve B.Sounds, Abd Soft, No tenderness, No organomegaly appriciated, No rebound - guarding or rigidity. No Cyanosis, Clubbing or edema, No new Rash or bruise    I have personally reviewed the following:   Data Reviewed: Basic Metabolic Panel: Recent Labs  Lab 02/28/19 0234 03/01/19 0237 03/01/19 0841 03/02/19 0213 03/03/19 0223 03/03/19 1401  03/03/19 1407 03/04/19 0152 03/05/19 0234 03/06/19 0212  NA 137 136  --  138 139 137   143 142  --  137  --   K 4.0 3.9  --  3.9 4.2 4.4   3.8 4.0  --  3.9  --   CL 96* 96*  --  99 98  --   --   --  96*  --   CO2 31 30  --  29 30  --   --   --  32  --   GLUCOSE 281* 261*  --  226* 128*  --   --   --  51*  --   BUN 59* 61*  --  58* 56*  --   --   --  46*  --   CREATININE 1.30* 1.36*  --  1.24* 1.21*  --   --   --  1.14*  --   CALCIUM 8.8* 8.8*  --  8.8* 9.0  --   --   --  8.9  --   MG 2.3  --  2.3 2.3 2.5*  --   --  2.4 2.4 2.5*  PHOS 3.5  --  4.3 4.0  --   --   --   --   --   --    Liver Function Tests: Recent Labs  Lab 02/28/19 0234 03/02/19 0213  AST 29 23  ALT 40 36  ALKPHOS 122 106  BILITOT 0.4 0.4  PROT 6.0* 5.7*  ALBUMIN 3.1* 3.1*   No  results for input(s): LIPASE, AMYLASE in the last 168 hours. No results for input(s): AMMONIA in the last 168 hours. CBC: Recent Labs  Lab 02/28/19 0234 03/01/19 0841 03/02/19 0213 03/03/19 1401 03/03/19 1407  WBC 11.0* 12.9* 11.3*  --   --   NEUTROABS 10.0* 11.0* 9.4*  --   --   HGB 8.8* 9.6* 9.0* 9.9*   8.5* 8.8*  HCT 28.6* 32.0* 29.1* 29.0*   25.0* 26.0*  MCV 82.7 82.7 82.2  --   --   PLT 239 249 218  --   --    Cardiac Enzymes: No results for input(s): CKTOTAL, CKMB, CKMBINDEX, TROPONINI in the last 168 hours. BNP (last 3 results) Recent Labs    04/09/18 1749 02/13/19 1805 02/24/19 0325  BNP 297.8* 1,211.6* 1,344.9*    ProBNP (last 3 results) No results for input(s): PROBNP in the last 8760 hours.  CBG: Recent Labs  Lab 03/05/19 1125 03/05/19 1619 03/05/19 2114 03/06/19 0625 03/06/19 1058  GLUCAP 263* 310* 194* 133* 103*    No results found for this or any previous visit (from the past 240 hour(s)).   Studies: No results found.  Scheduled Meds:  arformoterol  15 mcg Nebulization BID   atorvastatin  40 mg Oral q1800   azelastine  1 spray Each Nare BID   budesonide (PULMICORT) nebulizer solution   0.5 mg Nebulization BID   docusate sodium  100 mg Oral BID   furosemide  40 mg Intravenous Q12H   gabapentin  300 mg Oral TID   guaiFENesin  600 mg Oral BID   heparin  5,000 Units Subcutaneous Q8H   insulin aspart  0-20 Units Subcutaneous TID WC   insulin aspart  0-5 Units Subcutaneous QHS   insulin aspart  8 Units Subcutaneous TID WC   insulin detemir  23 Units Subcutaneous BID   ipratropium  0.5 mg Nebulization TID   iron polysaccharides  150 mg Oral BID   levalbuterol  0.63 mg Nebulization TID   macitentan  10 mg Oral Daily   mouth rinse  15 mL Mouth Rinse BID   midodrine  5 mg Oral TID WC   multivitamin with minerals  1 tablet Oral Daily   potassium chloride  20 mEq Oral BID   [START ON 03/07/2019] predniSONE  10 mg Oral Q breakfast   Selexipag  600 mcg Oral BID   sertraline  75 mg Oral Daily   sodium chloride flush  3 mL Intravenous Q12H   traMADol  50 mg Oral Q6H   umeclidinium bromide  1 puff Inhalation Daily   vitamin C  250 mg Oral BID   Continuous Infusions:  sodium chloride      Active Problems:   Chronic obstructive pulmonary disease ([name])   Type 2 diabetes mellitus with complication, with long-term current use of insulin ([name])   HTN (hypertension)   Acute respiratory failure with hypoxia ([name])   Chronic diastolic CHF (congestive heart failure) ([name])   Cor pulmonale ([name])   Pulmonary fibrosis ([name])   Pulmonary HTN ([name])   Acute on chronic postoperative respiratory failure ([name])   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

## 2019-03-07 LAB — GLUCOSE, CAPILLARY
Glucose-Capillary: 128 mg/dL — ABNORMAL HIGH (ref 70–99)
Glucose-Capillary: 98 mg/dL (ref 70–99)
Glucose-Capillary: 163 mg/dL — ABNORMAL HIGH (ref 70–99)
Glucose-Capillary: 281 mg/dL — ABNORMAL HIGH (ref 70–99)
Glucose-Capillary: 223 mg/dL — ABNORMAL HIGH (ref 70–99)

## 2019-03-07 LAB — MAGNESIUM: Magnesium: 2.5 mg/dL — ABNORMAL HIGH (ref 1.7–2.4)

## 2019-03-07 MED ORDER — INSULIN DETEMIR 100 UNIT/ML ~~LOC~~ SOLN
20.0000 [IU] | Freq: Two times a day (BID) | SUBCUTANEOUS | Status: DC
  Administered 2019-03-07 – 2019-03-10 (×7): 20 [IU] via SUBCUTANEOUS
  Filled 2019-03-07 (×8): qty 0.2

## 2019-03-07 NOTE — Progress Notes (Addendum)
[facility]  PROGRESS NOTE  Amy Shepard MBE:[id number] DOB: 07-13-1946 DOA: 02/11/2019 PCP: Maurice Small, MD  Brief History    Amy Shepard is a 73 y.o. year old female with medical history significant for chronic hypoxic respiratory failure secondary to COPD/moderate pulmonary hypertension on oxygen 4 L, CAD, prior non-small cell lung cancer in January 2010 s/p right lower lobe superior segmentectomy, hypertension, chronic diastolic heart failure, HTN, HLD, DM, CKD stage III, anemia of chronic disease, right charcot ankle fracture  who presented on 02/11/2019 and underwent planned elective repair of right trimalleolar fracture by Dr. Sharol Given.  Patient underwent hardware removal and fusion of ankle but postoperatively on 8/2  developed worsening hypoxia with pulse oximetry in low 40s requiring 15 L nonrebreather mask to improve to high 70s with labored breathing. She was ultimately transitioned to 15 L HFNC with her oxygen saturation improving to low 90s.    Hospitalist service was consulted on 8/2 due to hypoxia noted above.  During hospital course patient's postoperative respiratory failure has been treated as COPD exacerbation with acute exacerbation of diastolic CHF in setting of known pulmonary hypertension.  Her COPD exacerbation was treated with IV steroids and aggressive bronchodilator therapy with assistance of pulmonary consultants as well and now undergoing prednisone taper.  Her CHF was treated with the assistance of advanced heart failure with continued IV diuresis.   Regarding her Pulmonary HTN she underwent RHC on 8/20 that showed mild PAH with well compensated filling pressures per AHF.   The risk of shunting with increasing her PAH meds the risk outweigh the benefits per advanced heart failure recommendations.  Patient continued her home [place] med regimen.   Palliative care was also  consulted to assist with goals of care given poor prognosis. Patient changed code status  to Partial .  Have been able to wean patient to 5 L HFNC from upwards of 25 on 8/22  A & P   Acute on chronic hypoxic/hypercapnic respiratory failure, improving Acute on chronic cor pulmonale Pulmonary hypertension previous baseline 4 L O2, now stable Spo2 on 5 L HFNC - post operatively has required upwards of 25 L to maintain normal oxygen saturation - would transition to regular nasal canula from high flow prior to discharge -Unclear etiology, right heart cath shows normal left-sided filling pressures,risk of shunting outweighs any benefits and increasing her PAH meds per advanced heart failure consultants -Continue opsumit, uptravi(pt states was informed by pharmacy she has only a few tabs of her supply left) - Encourage incentive for monitoring, flutter valve -Appreciate palliative care recommendations, patient currently partial code - Appears well diuresed, right heart cath shows good left-sided filling pressures,  Would recommend resuming home lasix regimen on discharge with close PCP follow up with BMP  COPD exacerbation , improving has completed azithromycin course for flare. No active wheeze on exam -Continue scheduled inhalers -Continue prednisone taper 10 mg decrease/day, last dose prednisone 10 mg 8/25 on discharge  Type 2 diabetes with hyperglycemia, peripheral neuropathy.  A1c 6.9.  Hyperglycemia worsened in setting of concurrent steroid use, No recurrent hypoglycemia FBG 100-120 will decrease levemir from 23 U to 20 U BID. Would recommend resuming home Levemir 30 u qhs on discharge with SSI as needed and close pcp follow up given patient will be off prednisone therapy in one more day  Anemia of chronic disease (CKD), hemoglobin, stable.  Transfuse for hemoglobin less than 7 no active signs of bleeding.  CKD stage III.  1.2-1.6.  Creatinine  currently stable.  Continue to monitor output, avoid nephrotoxins.  Orthostatic hypotension, continue home midodrine  Depression,  stable continue Zoloft  Nonhealing right trimalleolar ankle fracture status post hardware removal and fusion on 7/31 by Dr. Sharol Given    DVT prophylaxis: Heparin Code Status: Partial, changed 8/19 after discussion with palliative care Family Communication: No family at bedside Disposition Plan: stable O2 requirements, would d/c IV lasix and resume home lasix on discharge, ensure patient has appropriate O2 ( 10 L tank on discharge with case management), from medicine/pulmonary standpoint given stable O2 ok for discharge. Further dispo per primary team-orthopedics  Thank you for this interesting consultation We will be signing off    [facility] Direct contact: see [email or web address] (further directions at bottom of note if needed) 7PM-7AM contact night coverage as at bottom of note 03/07/2019, 8:11 AM  LOS: 24 days   Consultants  . AHF, PCCM, Palliative, [facility]  Procedures  02/11/2019-removal of deep hardware right ankle and right ankle fusion by Dr. Sharol Given.  03/03/2019, right heart cath: Excellent diuresis with normal left-sided filling pressures 2. Mild PAH on therapy 3. No evidence of intracardiac shunting  Antibiotics  .   Interval History/Subjective  Stable oxygen requirements Patient reports speaking to pulmonary consultants who are pleased with her O2 requirements, per patient okay for discharge  Objective   Vitals:  Vitals:   03/07/19 0719 03/07/19 0730  BP: (!) 93/59   Pulse: 64 65  Resp: 20 16  Temp: 98.4 F (36.9 C)   SpO2: 96% 97%    Exam:  Awake Alert, Oriented X 3, No new F.N deficits, Normal affect Amy Shepard, ,No JVD appreciated  Symmetrical Chest wall movement, clear breath sounds throughout, diminished breath sounds at bases, CTAB, able to speak in complete sentences maintaining O2 sats greater than 90% on 5 L high flow nasal cannula RRR,No Gallops,Rubs or new Murmurs, No edema +ve B.Sounds, Abd Soft, No tenderness, No organomegaly appriciated, No rebound -  guarding or rigidity. Clean, dry Dressing in place on right foot/ankle  I have personally reviewed the following:   Data Reviewed: Basic Metabolic Panel: Recent Labs  Lab 03/01/19 0237  03/01/19 0841 03/02/19 0213 03/03/19 0223 03/03/19 1401 03/03/19 1407 03/04/19 0152 03/05/19 0234 03/06/19 0212 03/07/19 0241  NA 136  --   --  138 139 137  143 142  --  137  --   --   K 3.9  --   --  3.9 4.2 4.4  3.8 4.0  --  3.9  --   --   CL 96*  --   --  99 98  --   --   --  96*  --   --   CO2 30  --   --  29 30  --   --   --  32  --   --   GLUCOSE 261*  --   --  226* 128*  --   --   --  51*  --   --   BUN 61*  --   --  58* 56*  --   --   --  46*  --   --   CREATININE 1.36*  --   --  1.24* 1.21*  --   --   --  1.14*  --   --   CALCIUM 8.8*  --   --  8.8* 9.0  --   --   --  8.9  --   --  MG  --    < > 2.3 2.3 2.5*  --   --  2.4 2.4 2.5* 2.5*  PHOS  --   --  4.3 4.0  --   --   --   --   --   --   --    < > = values in this interval not displayed.   Liver Function Tests: Recent Labs  Lab 03/02/19 0213  AST 23  ALT 36  ALKPHOS 106  BILITOT 0.4  PROT 5.7*  ALBUMIN 3.1*   No results for input(s): LIPASE, AMYLASE in the last 168 hours. No results for input(s): AMMONIA in the last 168 hours. CBC: Recent Labs  Lab 03/01/19 0841 03/02/19 0213 03/03/19 1401 03/03/19 1407  WBC 12.9* 11.3*  --   --   NEUTROABS 11.0* 9.4*  --   --   HGB 9.6* 9.0* 9.9*  8.5* 8.8*  HCT 32.0* 29.1* 29.0*  25.0* 26.0*  MCV 82.7 82.2  --   --   PLT 249 218  --   --    Cardiac Enzymes: No results for input(s): CKTOTAL, CKMB, CKMBINDEX, TROPONINI in the last 168 hours. BNP (last 3 results) Recent Labs    04/09/18 1749 02/13/19 1805 02/24/19 0325  BNP 297.8* 1,211.6* 1,344.9*    ProBNP (last 3 results) No results for input(s): PROBNP in the last 8760 hours.  CBG: Recent Labs  Lab 03/06/19 0625 03/06/19 1058 03/06/19 1621 03/06/19 2125 03/07/19 0637  GLUCAP 133* 103* 239* 150* 128*     No results found for this or any previous visit (from the past 240 hour(s)).   Studies: No results found.  Scheduled Meds: . arformoterol  15 mcg Nebulization BID  . atorvastatin  40 mg Oral q1800  . azelastine  1 spray Each Nare BID  . budesonide (PULMICORT) nebulizer solution  0.5 mg Nebulization BID  . docusate sodium  100 mg Oral BID  . furosemide  40 mg Intravenous Q12H  . gabapentin  300 mg Oral TID  . guaiFENesin  600 mg Oral BID  . heparin  5,000 Units Subcutaneous Q8H  . insulin aspart  0-20 Units Subcutaneous TID WC  . insulin aspart  0-5 Units Subcutaneous QHS  . insulin aspart  8 Units Subcutaneous TID WC  . insulin detemir  20 Units Subcutaneous BID  . ipratropium  0.5 mg Nebulization TID  . iron polysaccharides  150 mg Oral BID  . levalbuterol  0.63 mg Nebulization TID  . macitentan  10 mg Oral Daily  . mouth rinse  15 mL Mouth Rinse BID  . midodrine  5 mg Oral TID WC  . multivitamin with minerals  1 tablet Oral Daily  . potassium chloride  20 mEq Oral BID  . predniSONE  10 mg Oral Q breakfast  . Selexipag  600 mcg Oral BID  . sertraline  75 mg Oral Daily  . sodium chloride flush  3 mL Intravenous Q12H  . traMADol  50 mg Oral Q6H  . umeclidinium bromide  1 puff Inhalation Daily  . vitamin C  250 mg Oral BID   Continuous Infusions: . 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

## 2019-03-07 NOTE — Progress Notes (Signed)
Patient ID: Amy Shepard, female   DOB: Mar 24, 1946, 73 y.o.   MRN: [id number]    This 73 y.o. female with multiple medical problems including chronic diastolic congestive heart failure, pulmonary hypertension, lung cancer, chronic kidney disease stage III, diabetes,COPD (home oxygen 3-4L/[place]), hypertension,  and a recent complicated right ankle fracture.   She presented for removal of right ankle hardware and fusion. She recently underwent ORIF of right ankle which developed progressive Charcot collapse. She has been wearing a fracture boot with retained hardware. Patient developed postoperative respiratory failure requiring BiPAP and HFNC.   Today is day 24 of a long complicated hospitalization, however she is improving  This nurse practitioner followed up with patient at bedside for palliative medicine needs and emotional support.  Patient has continued to improve over the last several days and is currently on 6 L of nasal cannula,    and tells me she is" feeling better".  She is out of bed to the chair and is excited to verbalize her plan is to return home.  We did discuss the likely increased care needs that she will have on discharge.  She is open to all services that she is eligible for.  We discussed outpatient community-based palliative medicine services and she is in agreement.  Patient understands the seriousness of her current medical situation and high risk for decompensation.  Discussed with patient the importance of continued conversation with her  medical providers regarding overall plan of care and treatment options,  ensuring decisions are within the context of the patients values and GOCs.  Again discussed and stressed the importance of documentation of healthcare power of attorney and advanced directives.  Questions and concerns addressed   Total time spent on the unit was 35 minutes  Greater than 50% of the time was spent in counseling and coordination of  care  Wadie Lessen NP  Palliative Medicine Team Team Phone # [phone] Pager [phone]

## 2019-03-07 NOTE — Progress Notes (Signed)
Nutrition Follow-up  DOCUMENTATION CODES:   Obesity unspecified  INTERVENTION:   - HS snack  - MVI with minerals daily  - Encouraged continued adequate PO intake  NUTRITION DIAGNOSIS:   Increased nutrient needs related to post-op healing, chronic illness as evidenced by estimated needs.  Ongoing  GOAL:   Patient will meet greater than or equal to 90% of their needs  Progressing  MONITOR:   PO intake, Supplement acceptance, I & O's, Weight trends, Labs, Skin  REASON FOR ASSESSMENT:   LOS    ASSESSMENT:   73 year old female who presented on 7/31 with closed fracture of right ankle with malunion, failure of fixation. Pt presented for removal of right ankle hardware and fusion of the right ankle. Wound VAC placed. 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  8/07 - VAC removed 8/20 - s/p right heart cath  Pt now on 5 L HFNC.  Spoke with pt at bedside. Pt reports she may be going home today.  Pt shares that she has a good appetite and eats well at meals. Pt reports that the portions are small and that she gets hungry in the evening. RD to order HS snack.  Pt reports that she knows she has lost fluid weight but also things she has lost some muscle mass ("my legs have deflated"). RD discussed the importance of adequate PO intake with a focus on protein-rich foods in maintaining lean muscle mass.  Weight down a total of 6 lbs since admission. Suspect weight loss related to diuresis.  Meal Completion: 60-100% x last 8 meals (averaging 95%)  Medications reviewed and include: Colace, Lasix, SSI, Novolog 8 units TID with meals, Levemir 20 units BID, Niferex, MVI with minerals, K-dur 20 mEq BID, Prednisone taper, vitamin C 250 mg BID  Labs reviewed: mangeisum 2.5, hemoglobin 8.8 CBG's: 103-239 x 24 hours  UOP: 2900 ml x 24 hours I/O's: -20.4 L  since admit  NUTRITION - FOCUSED PHYSICAL EXAM:    Most Recent Value  Orbital Region  No depletion  Upper Arm Region  No depletion  Thoracic and Lumbar Region  No depletion  Buccal Region  No depletion  Temple Region  Mild depletion  Clavicle Bone Region  Mild depletion  Clavicle and Acromion Bone Region  Mild depletion  Scapular Bone Region  Mild depletion  Dorsal Hand  Mild depletion  Patellar Region  Mild depletion  Anterior Thigh Region  Mild depletion  Posterior Calf Region  Mild depletion  Edema (RD Assessment)  None  Hair  Reviewed  Eyes  Reviewed  Mouth  Reviewed  Skin  Reviewed  Nails  Reviewed       Diet Order:   Diet Order            Diet Heart Room service appropriate? Yes; Fluid consistency: Thin  Diet effective now              EDUCATION NEEDS:   No education needs have been identified at this time  Skin:  Skin Assessment: Skin Integrity Issues: Skin Integrity Issues:: Incisions Incisions: right ankle  Last BM:  03/06/19  Height:   Ht Readings from Last 1 Encounters:  02/11/19 5' 2" (1.575 m)    Weight:   Wt Readings from Last 1 Encounters:  03/07/19 75.6 kg    Ideal Body Weight:  50 kg  BMI:  Body mass index  is 30.48 kg/m.  Estimated Nutritional Needs:   Kcal:  1600-1800  Protein:  75-90 grams  Fluid:  1.6 L    Gaynell Face, MS, RD, LDN Inpatient Clinical Dietitian Pager: [phone] Weekend/After Hours: [phone]

## 2019-03-07 NOTE — Care Management Important Message (Signed)
Important Message  Patient Details  Name: Amy Shepard MRN: [id number] Date of Birth: Apr 17, 1946   Medicare Important Message Given:  Yes     Memory Argue 03/07/2019, 3:00 PM

## 2019-03-07 NOTE — Plan of Care (Signed)

## 2019-03-07 NOTE — Plan of Care (Signed)

## 2019-03-07 NOTE — Progress Notes (Signed)
NAME:  Amy Shepard, MRN:  [id number], DOB:  19-Nov-1945, LOS: 24 ADMISSION DATE:  02/11/2019, CONSULTATION DATE:  02/21/2019 REFERRING MD:  Dr. Sloan Leiter, CHIEF COMPLAINT:  Hypoxia  Brief History   73 year old female with chronic hypoxic respiratory failure admitted 7/31 for elective repair of nonhealing right trimalleolar ankle fracture s/p hardware removal and fusion on 7/31 by Dr. Sharol Given, being medically managed by [facility].  Post-operatively has had increased and ongoing oxygenation requirements despite optimized medical therapy with IV steroid taper and diuresis and remains on heated high flow unable to wean.  PCCM consulted for further pulmonary recommendations.   Past Medical History  Chronic hypoxic respiratory failure on home oxygen 4L, COPD, CAD, prior non-small cell lung cancer in January 2010 s/p right lower lobe superior segmentectomy with LN dissection and chemotherapy with carboplatin and etopiside, moderate pulmonary hypertension, chronic diastolic heart failure, HTN, HLD, DM, CKD stage III, anemia of chronic disease, right charcot ankle fracture  She is followed by Dr. Aundra Dubin for HF/ pulmonary hypertension  Significant Hospital Events   7/31 Admitted / OR  8/10 PCCM consult.  Add abx, steroids, changed nebs, diuresis  8/11 Afebrile, less wheeze, HFNC 100% / 30L.  Refused BiPAP overnight. Net neg 4.8.  8/12 HFNC 25L, 65% FiO2 8/14 HFNC 20L, 55% FIO2, increased to 60%  8/17 HFNC 20L 50% FiO2  8/18 dropped to 40% 8/19 back to 50% as SpO2 down to 80s 8/21 transitioned to 10 L O2  Consults:  Ortho [facility]   Procedures:  7/31 OR for Charcot Collapse Right Ankle with Failure Fixation  Significant Diagnostic Tests:  8/3 TTE >> EF 55 to 62%, mod RV systolic dysfx with D shaped intraventricular septum, mild AS, PAS 88 mmHg 8/20 RHC >> RA = 3 RV = 58/5 PA = 59/20 (34) PCW = 10 Fick cardiac output/index = 7.4/4.2 PVR = 3.2 WU FA sat = 99% PA sat = 72%, 73% SVC sat  69%  Micro Data:  8/2 SARS CoV-2 >> negative 7/31 right ankle tissue cx >> neg  Antimicrobials:  7/30 vanc OR 8/10 azithro >>off  Interim history/subjective:   Oxygen saturations improved Out of bed, denies chest pain or dyspnea   Objective   Blood pressure (!) 107/53, pulse 71, temperature 98.4 F (36.9 C), temperature source Oral, resp. rate 15, height _0  (1.575 m), weight 75.6 kg, SpO2 99 %.        Intake/Output Summary (Last 24 hours) at 03/07/2019 1121 Last data filed at 03/07/2019 1016 Gross per 24 hour  Intake 1160 ml  Output 2400 ml  Net -1240 ml   Filed Weights   03/05/19 0500 03/06/19 0300 03/07/19 0256  Weight: 79.1 kg 76.2 kg 75.6 kg   Examination:  General: elderly, obese female. Sitting in recliner.  In NAD  Mild pallor, no icterus, no JVD Decreased breath sounds bilateral, no rhonchi S1-S2 distant, no S3 Alert oriented x4, nonfocal Soft and nontender abdomen 1+ edema   Assessment & Plan:   Acute on chronic hypoxic, hypercapnic respiratory failure (Baseline 4L O2) P:\ Oxygen requirements seem to have come back to baseline Ambulate to decide on oxygen prescription I have asked case management to provide prescription to DME to increase her oxygen tank to 10 L at home   COPD Exacerbation  Hx of NSCLC s/p RLL superior segment resection in 2010, and chemotherapy in 2010. -quit smoking 2008, up to 1.5ppd prior to that -s/p course of azithromycin for AECOPD P: Can resume  her home regimen of Dulera and duo nebs Discontinue prednisone  Acute on chronic cor pulmonale Pulmonary hypertension. -RHC 07/2017 with moderate PAH with PVR 4.7 WU, preserved cardiac output, near normal filling pressures.  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  Has been managed by CHF team as an outpt for pulmonary hypertension medications  Etiology: consider autoimmune evaluation > pt has sister, brother with the same.  Sister was a smoker brother was not.  Father died of the same. P:   Appreciate heart failure input Continue opsumit, uptravi Resume home dose Lasix 60 twice daily    Goals of Care: Limited CODE STATUS issued now, no CPR, no intubation appreciate palliative care discussions  She can be discharged home, we will arrange for follow-up with pulmonary clinic  Kara Mead MD. [facility]. [name] [facility] Pager [phone] If no response call [phone]     03/07/2019, 11:21 AM

## 2019-03-07 NOTE — Progress Notes (Signed)
Patient ambulated in hallway with RN approximately 150 feet. Patient complains of arm weakness and generalized weakness.  Patients O2 remained between 88- 93. O2 briefly dropped to 83 while patient rested on front wheel walker but rebounded back to 88-90 once ambulating again.

## 2019-03-08 LAB — GLUCOSE, CAPILLARY
Glucose-Capillary: 70 mg/dL (ref 70–99)
Glucose-Capillary: 107 mg/dL — ABNORMAL HIGH (ref 70–99)
Glucose-Capillary: 112 mg/dL — ABNORMAL HIGH (ref 70–99)
Glucose-Capillary: 117 mg/dL — ABNORMAL HIGH (ref 70–99)
Glucose-Capillary: 146 mg/dL — ABNORMAL HIGH (ref 70–99)

## 2019-03-08 LAB — MAGNESIUM: Magnesium: 2.4 mg/dL (ref 1.7–2.4)

## 2019-03-08 MED ORDER — SELEXIPAG 600 MCG PO TABS
600.0000 ug | ORAL_TABLET | Freq: Two times a day (BID) | ORAL | Status: DC
  Administered 2019-03-08 – 2019-03-11 (×7): 600 ug via ORAL
  Filled 2019-03-08 (×7): qty 1

## 2019-03-08 NOTE — Progress Notes (Addendum)
Sutures removed form right ankle, small amount of sero-sanguinous  drainage noted form anterior section of suture line, dry dressing placed, care continues.

## 2019-03-08 NOTE — Progress Notes (Signed)
PT Cancellation Note  Patient Details Name: Amy Shepard MRN: [id number] DOB: 20-Aug-1945   Cancelled Treatment:    Reason Eval/Treat Not Completed: Fatigue/lethargy limiting ability to participate; patient fatigued from OT.  Discussed newly found from MD okay to ambulate WBAT with her fracture boot.  States now plans to go home and has all needed equipment except hospital bed.  Will check back to see another day.    Reginia Naas 03/08/2019, 4:13 PM  Magda Kiel, [place] [phone] 03/08/2019

## 2019-03-08 NOTE — Progress Notes (Signed)
Occupational Therapy Treatment Patient Details Name: Amy Shepard MRN: [id number] DOB: 05-26-1946 Today's Date: 03/08/2019    History of present illness 73 yo 10wks s/p trimalleolar Rt ankle fx with syndesmotic disruption. 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 7/31 hardware removal with ankle fusion, wound VAC. PMHx: lung CA, CAD, spinal fusion, depression, CHF, pulmonary HTN, CKD   OT comments  Patient seated in recliner and agreeable to OT.  On 5L supplemental oxgyen via  today with saturations maintained throughout ADLs and transfer training.  She reports "I am going home now" and reports living alone. Encouraged use of WC for mobility, stand pivot transfers to 3:1 over commode for toilet transfers. Reviewed home safety, recommendations and energy conservation.  Demonstrates toilet transfer with supervision, and LB dressing with supervision.  Updated dc plan to [place].  Will follow acutely.    Follow Up Recommendations  Home health OT;Supervision - Intermittent(for transfers/ADLs )    Equipment Recommendations  3 in 1 bedside commode;Hospital bed    Recommendations for Other Services      Precautions / Restrictions Precautions Precautions: Fall Precaution Comments: watch O2 Required Braces or Orthoses: Other Brace Other Brace: CAM boot RLE Restrictions Weight Bearing Restrictions: Yes RLE Weight Bearing: Weight bearing as tolerated Other Position/Activity Restrictions: WBAT for transfer only with CAM boot       Mobility Bed Mobility               General bed mobility comments: OOB in recliner upon entry   Transfers Overall transfer level: Needs assistance   Transfers: Sit to/from Stand;Stand Pivot Transfers Sit to Stand: Supervision Stand pivot transfers: Supervision       General transfer comment: supervision for safety, line mgmt     Balance Overall balance assessment: Needs assistance Sitting-balance support: No upper  extremity supported;Feet supported Sitting balance-Leahy Scale: Good     Standing balance support: No upper extremity supported;During functional activity Standing balance-Leahy Scale: Fair Standing balance comment: relaint on UE support dynamically, able to stand statically initally during transfer with no UE support                           ADL either performed or assessed with clinical judgement   ADL Overall ADL's : Needs assistance/impaired     Grooming: Set up;Sitting           Upper Body Dressing : Set up;Sitting   Lower Body Dressing: Supervision/safety;Sit to/from stand Lower Body Dressing Details (indicate cue type and reason): able to doff L sock and don shoe, supervision sit<>Stand  Toilet Transfer: Supervision/safety;Stand-pivot;BSC Toilet Transfer Details (indicate cue type and reason): requires increased time and effort, no losses of balance, good technique Toileting- Clothing Manipulation and Hygiene: Supervision/safety;Sit to/from stand Toileting - Clothing Manipulation Details (indicate cue type and reason): simulated     Functional mobility during ADLs: Supervision/safety General ADL Comments: reviewed energy conservation techniques, home safety and recommendations to utilize [employer]      Vision       Perception     Praxis      Cognition Arousal/Alertness: Awake/alert Behavior During Therapy: WFL for tasks assessed/performed Overall Cognitive Status: Within Functional Limits for tasks assessed                                          Exercises  Shoulder Instructions       General Comments pt on 5L [place] throughout session, saturations maintained 93-96%    Pertinent Vitals/ Pain       Pain Assessment: No/denies pain  Home Living                                          Prior Functioning/Environment              Frequency  Min 2X/week        Progress Toward Goals  OT Goals(current  goals can now be found in the care plan section)  Progress towards OT goals: Progressing toward goals  Acute Rehab OT Goals Patient Stated Goal: to go home OT Goal Formulation: With patient Time For Goal Achievement: 03/22/19 Potential to Achieve Goals: Good  Plan Discharge plan needs to be updated;Frequency remains appropriate    Co-evaluation                 AM-PAC OT "6 Clicks" Daily Activity     Outcome Measure   Help from another person eating meals?: None Help from another person taking care of personal grooming?: None(seated) Help from another person toileting, which includes using toliet, bedpan, or urinal?: A Little Help from another person bathing (including washing, rinsing, drying)?: A Little Help from another person to put on and taking off regular upper body clothing?: A Little Help from another person to put on and taking off regular lower body clothing?: A Little 6 Click Score: 20    End of Session Equipment Utilized During Treatment: Oxygen(5L)  OT Visit Diagnosis: Other abnormalities of gait and mobility (R26.89);Pain   Activity Tolerance Patient tolerated treatment well   Patient Left in chair;with call bell/phone within reach   Nurse Communication Mobility status        Time: 1425-1445 OT Time Calculation (min): 20 min  Charges: OT General Charges $OT Visit: 1 Visit OT Treatments $Self Care/Home Management : 8-22 mins  Delight Stare, OT Acute Rehabilitation Services Pager [phone] Office [phone]    Delight Stare 03/08/2019, 3:12 PM

## 2019-03-08 NOTE — Plan of Care (Signed)

## 2019-03-09 LAB — BASIC METABOLIC PANEL
Anion gap: 10 (ref 5–15)
BUN: 56 mg/dL — ABNORMAL HIGH (ref 8–23)
CO2: 33 mmol/L — ABNORMAL HIGH (ref 22–32)
Calcium: 9.2 mg/dL (ref 8.9–10.3)
Chloride: 96 mmol/L — ABNORMAL LOW (ref 98–111)
Creatinine, Ser: 1.23 mg/dL — ABNORMAL HIGH (ref 0.44–1.00)
GFR calc Af Amer: 51 mL/min — ABNORMAL LOW (ref >60)
GFR calc non Af Amer: 44 mL/min — ABNORMAL LOW (ref >60)
Glucose, Bld: 187 mg/dL — ABNORMAL HIGH (ref 70–99)
Potassium: 4.7 mmol/L (ref 3.5–5.1)
Sodium: 139 mmol/L (ref 135–145)

## 2019-03-09 LAB — GLUCOSE, CAPILLARY
Glucose-Capillary: 208 mg/dL — ABNORMAL HIGH (ref 70–99)
Glucose-Capillary: 90 mg/dL (ref 70–99)
Glucose-Capillary: 133 mg/dL — ABNORMAL HIGH (ref 70–99)
Glucose-Capillary: 117 mg/dL — ABNORMAL HIGH (ref 70–99)

## 2019-03-09 LAB — MAGNESIUM: Magnesium: 2.4 mg/dL (ref 1.7–2.4)

## 2019-03-09 NOTE — Progress Notes (Signed)
Physical Therapy Treatment Patient Details Name: Amy Shepard MRN: [id number] DOB: October 20, 1945 Today's Date: 03/09/2019    History of Present Illness 73 yo 10wks s/p trimalleolar Rt ankle fx with syndesmotic disruption. 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 7/31 hardware removal with ankle fusion, wound VAC. PMHx: lung CA, CAD, spinal fusion, depression, CHF, pulmonary HTN, CKD    PT Comments    Patient progressing with mobility and now with plans for d/c to home and able to ambulate with camboot per MD.  Still fatigues quickly with little activity so discussed ways to rest between activity and use her wheelchair in the home for safety and asked RNCM about meals on wheels.  Patient seems determined for home right now so recommend max HH service.  PT to follow acutely with new goals for ambulation as well.    Follow Up Recommendations  Home health PT(HH aide)     Equipment Recommendations  None recommended by PT    Recommendations for Other Services       Precautions / Restrictions Precautions Precautions: Fall Required Braces or Orthoses: Other Brace Other Brace: CAM boot RLE Restrictions LLE Weight Bearing: Weight bearing as tolerated Other Position/Activity Restrictions: okay for ambulation per MD 03/08/19    Mobility  Bed Mobility               General bed mobility comments: OOB in recliner upon entry   Transfers   Equipment used: Rolling walker (2 wheeled) Transfers: Sit to/from Stand Sit to Stand: Supervision         General transfer comment: assist for safety wiht lines, stable with RW and while standing to don robe  Ambulation/Gait Ambulation/Gait assistance: Supervision Gait Distance (Feet): 50 Feet Assistive device: Rolling walker (2 wheeled) Gait Pattern/deviations: Step-to pattern;Step-through pattern;Trunk flexed     General Gait Details: could not find her other shoe so little off with camboot, but used walker for  stability, was fatiguing with use of UE's and reports they shake at times, on 6-8LPM O2 with SpO2 98% (increased due to reading 70%, but rebounded possibly due to hand on walker)   Stairs             Wheelchair Mobility    Modified Rankin (Stroke Patients Only)       Balance     Sitting balance-Leahy Scale: Good       Standing balance-Leahy Scale: Fair Standing balance comment: standing no UE support to don robe                            Cognition Arousal/Alertness: Awake/alert Behavior During Therapy: WFL for tasks assessed/performed Overall Cognitive Status: Within Functional Limits for tasks assessed                                        Exercises Other Exercises Other Exercises: sit<>stand x 3 cues to use UE's both ways for strengthening her arms Other Exercises: demonstrated UE elbow extension with yellow t-band    General Comments General comments (skin integrity, edema, etc.): extensive education about rest breaks, doing as much of IADL's in sitting position in w/c; pt related she had rigged up a way to hook the rollator on her w/c to tote the laundry basket      Pertinent Vitals/Pain Pain Assessment: No/denies pain    Home Living  Prior Function            PT Goals (current goals can now be found in the care plan section) Acute Rehab PT Goals Patient Stated Goal: to go home PT Goal Formulation: With patient Time For Goal Achievement: 03/23/19 Potential to Achieve Goals: Good Progress towards PT goals: Goals met and updated - see care plan    Frequency    Min 3X/week      PT Plan Discharge plan needs to be updated;Frequency needs to be updated    Co-evaluation              AM-PAC PT "6 Clicks" Mobility   Outcome Measure  Help needed turning from your back to your side while in a flat bed without using bedrails?: None Help needed moving from lying on your back to sitting  on the side of a flat bed without using bedrails?: None Help needed moving to and from a bed to a chair (including a wheelchair)?: A Little Help needed standing up from a chair using your arms (e.g., wheelchair or bedside chair)?: None Help needed to walk in hospital room?: A Little Help needed climbing 3-5 steps with a railing? : A Lot 6 Click Score: 20    End of Session Equipment Utilized During Treatment: Oxygen Activity Tolerance: Patient tolerated treatment well Patient left: in chair;with call bell/phone within reach   PT Visit Diagnosis: Muscle weakness (generalized) (M62.81);Other abnormalities of gait and mobility (R26.89)     Time: 8466-5993 PT Time Calculation (min) (ACUTE ONLY): 27 min  Charges:  $Gait Training: 8-22 mins $Self Care/Home Management: [place] [phone] 03/09/2019    Reginia Naas 03/09/2019, 10:41 AM

## 2019-03-09 NOTE — Progress Notes (Signed)
Patient suffers from respiratory failure which impairs their ability to perform daily activities like laying flat in a regular bed.  A hospital bed will provide improved positioning for lung expansion and aeration for decreased respiratory distress.   Recommending hospital bed for home.   Magda Kiel, [place] [phone] 03/09/2019

## 2019-03-09 NOTE — Progress Notes (Addendum)
NCM spoke with Zack with [employer] , he states he can get patient E-tanks, that will go up to 10 liters,  Also he is working on getting hospital bed for patient.  She is active with [facility] for HHRN, HHPT, will need orders to resume. NCM also contacted patient transportation at [phone], they state to call when she is ready for dc.  MD will need to put in new oxygen order.

## 2019-03-10 ENCOUNTER — Telehealth: Payer: Self-pay | Admitting: Orthopedic Surgery

## 2019-03-10 LAB — BASIC METABOLIC PANEL
Anion gap: 10 (ref 5–15)
BUN: 51 mg/dL — ABNORMAL HIGH (ref 8–23)
CO2: 30 mmol/L (ref 22–32)
Calcium: 8.9 mg/dL (ref 8.9–10.3)
Chloride: 99 mmol/L (ref 98–111)
Creatinine, Ser: 1.37 mg/dL — ABNORMAL HIGH (ref 0.44–1.00)
GFR calc Af Amer: 45 mL/min — ABNORMAL LOW (ref >60)
GFR calc non Af Amer: 38 mL/min — ABNORMAL LOW (ref >60)
Glucose, Bld: 65 mg/dL — ABNORMAL LOW (ref 70–99)
Potassium: 4.5 mmol/L (ref 3.5–5.1)
Sodium: 139 mmol/L (ref 135–145)

## 2019-03-10 LAB — GLUCOSE, CAPILLARY
Glucose-Capillary: 59 mg/dL — ABNORMAL LOW (ref 70–99)
Glucose-Capillary: 62 mg/dL — ABNORMAL LOW (ref 70–99)
Glucose-Capillary: 63 mg/dL — ABNORMAL LOW (ref 70–99)
Glucose-Capillary: 168 mg/dL — ABNORMAL HIGH (ref 70–99)
Glucose-Capillary: 199 mg/dL — ABNORMAL HIGH (ref 70–99)
Glucose-Capillary: 206 mg/dL — ABNORMAL HIGH (ref 70–99)
Glucose-Capillary: 143 mg/dL — ABNORMAL HIGH (ref 70–99)

## 2019-03-10 LAB — MAGNESIUM: Magnesium: 2.5 mg/dL — ABNORMAL HIGH (ref 1.7–2.4)

## 2019-03-10 MED ORDER — INSULIN ASPART 100 UNIT/ML ~~LOC~~ SOLN: 0.0000 [IU] | Freq: Every day | SUBCUTANEOUS | Status: DC

## 2019-03-10 MED ORDER — FUROSEMIDE 40 MG PO TABS
40.0000 mg | ORAL_TABLET | Freq: Two times a day (BID) | ORAL | Status: DC
  Administered 2019-03-10 – 2019-03-11 (×2): 40 mg via ORAL
  Filled 2019-03-10 (×2): qty 1

## 2019-03-10 MED ORDER — INSULIN DETEMIR 100 UNIT/ML ~~LOC~~ SOLN
15.0000 [IU] | Freq: Two times a day (BID) | SUBCUTANEOUS | Status: DC
  Administered 2019-03-10 – 2019-03-11 (×2): 15 [IU] via SUBCUTANEOUS
  Filled 2019-03-10 (×3): qty 0.15

## 2019-03-10 MED ORDER — INSULIN ASPART 100 UNIT/ML ~~LOC~~ SOLN
0.0000 [IU] | Freq: Three times a day (TID) | SUBCUTANEOUS | Status: DC
  Administered 2019-03-10: 5 [IU] via SUBCUTANEOUS
  Administered 2019-03-10: 4 [IU] via SUBCUTANEOUS
  Administered 2019-03-11: 3 [IU] via SUBCUTANEOUS
  Administered 2019-03-11: 2 [IU] via SUBCUTANEOUS

## 2019-03-10 NOTE — Progress Notes (Signed)
Results for VETTA, COUZENS (MRN [id number]) as of 03/10/2019 09:06  Ref. Range 03/09/2019 21:37 03/10/2019 06:41 03/10/2019 06:57 03/10/2019 07:00  Glucose-Capillary Latest Ref Range: 70 - 99 mg/dL 117 (H) 62 (L) 59 (L) 63 (L)  Noted that CBGs were 62-59-63 mg/dl this am.   Recommend decreasing Levemir to 15 units BID and decreasing Novolog correction scale to MODERATE TID & HS. Continue to meal coverage as ordered if patient eats at least 50% of meal.   Harvel Ricks RN BSN CDE Diabetes Coordinator Pager: [phone]  8am-5pm

## 2019-03-10 NOTE — Telephone Encounter (Signed)
Kin, from [facility], called stating that she needs Dr. Sharol Given to put in orders for the patient's POC.  There is no doctor currently following the patient.  CB#[phone].  Thank you.

## 2019-03-10 NOTE — Telephone Encounter (Signed)
Called and Amy Shepard advised she is aware. Already spoke with Dr. Sharol Given

## 2019-03-10 NOTE — Telephone Encounter (Signed)
Please see below.

## 2019-03-10 NOTE — Progress Notes (Signed)
Right lower leg and ankle cleansed with soap and water, dressing applied, refused kerlix and ace wrap.  Site dry and no drainage noted.

## 2019-03-10 NOTE — Progress Notes (Signed)
Occupational Therapy Treatment Patient Details Name: Amy Shepard MRN: [id number] DOB: 1945/08/29 Today's Date: 03/10/2019    History of present illness 73 yo 10wks s/p trimalleolar Rt ankle fx with syndesmotic disruption. 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 7/31 hardware removal with ankle fusion, wound VAC. PMHx: lung CA, CAD, spinal fusion, depression, CHF, pulmonary HTN, CKD   OT comments  Pt up in chair, had just completed sponge bath and dressing. Ambulated to sit at sink for grooming and then to commode for toileting with supervision. Reinforced energy conservation strategies for ADL and IADL for discharge home. Pt reports she has no family assistance despite her children living in town. She uses grocery store delivery services and is considering hiring someone to clean her home and provide some meal prep.   Follow Up Recommendations  Home health OT;Supervision - Intermittent    Equipment Recommendations  3 in 1 bedside commode;Hospital bed    Recommendations for Other Services      Precautions / Restrictions Precautions Precautions: Fall Precaution Comments: watch O2 Required Braces or Orthoses: Other Brace Other Brace: CAM boot RLE Restrictions Other Position/Activity Restrictions: okay for ambulation per MD 03/08/19       Mobility Bed Mobility               General bed mobility comments: OOB in recliner upon entry   Transfers Overall transfer level: Needs assistance Equipment used: Rolling walker (2 wheeled) Transfers: Sit to/from Stand Sit to Stand: Supervision         General transfer comment: assist for lines    Balance Overall balance assessment: Needs assistance   Sitting balance-Leahy Scale: Good     Standing balance support: No upper extremity supported;During functional activity Standing balance-Leahy Scale: Fair Standing balance comment: standing to don front opening gown                           ADL either performed or assessed with clinical judgement   ADL Overall ADL's : Needs assistance/impaired     Grooming: Oral care;Sitting;Set up           Upper Body Dressing : Set up;Sitting   Lower Body Dressing: Minimal assistance;Sitting/lateral leans(CAM boot)   Toilet Transfer: Supervision/safety;RW;Ambulation;BSC   Toileting- Clothing Manipulation and Hygiene: Supervision/safety;Sit to/from stand               Vision       Perception     Praxis      Cognition Arousal/Alertness: Awake/alert Behavior During Therapy: WFL for tasks assessed/performed Overall Cognitive Status: Within Functional Limits for tasks assessed                                 General Comments: pt able to restate energy conservation strategies as educated at end of session        Exercises     Shoulder Instructions       General Comments      Pertinent Vitals/ Pain       Pain Assessment: Faces Faces Pain Scale: No hurt  Home Living                                          Prior Functioning/Environment  Frequency  Min 2X/week        Progress Toward Goals  OT Goals(current goals can now be found in the care plan section)  Progress towards OT goals: Progressing toward goals  Acute Rehab OT Goals Patient Stated Goal: to go home OT Goal Formulation: With patient Time For Goal Achievement: 03/22/19 Potential to Achieve Goals: Good  Plan Frequency remains appropriate;Discharge plan remains appropriate    Co-evaluation                 AM-PAC OT "6 Clicks" Daily Activity     Outcome Measure   Help from another person eating meals?: None Help from another person taking care of personal grooming?: None Help from another person toileting, which includes using toliet, bedpan, or urinal?: A Little Help from another person bathing (including washing, rinsing, drying)?: A Little Help from another person to put  on and taking off regular upper body clothing?: None Help from another person to put on and taking off regular lower body clothing?: A Little 6 Click Score: 21    End of Session Equipment Utilized During Treatment: Oxygen;Rolling walker  OT Visit Diagnosis: Other abnormalities of gait and mobility (R26.89);Pain   Activity Tolerance Patient tolerated treatment well   Patient Left in chair;with call bell/phone within reach   Nurse Communication          Time: 4210-3128 OT Time Calculation (min): 27 min  Charges: OT General Charges $OT Visit: 1 Visit OT Treatments $Self Care/Home Management : 23-37 mins  Nestor Lewandowsky, OTR/L Acute Rehabilitation Services Pager: [phone] Office: [phone]   Malka So 03/10/2019, 3:28 PM

## 2019-03-10 NOTE — Telephone Encounter (Signed)
Call patient's nurse and inform her that we will discharge the patient tomorrow.

## 2019-03-11 ENCOUNTER — Telehealth: Payer: Self-pay | Admitting: Primary Care

## 2019-03-11 LAB — GLUCOSE, CAPILLARY
Glucose-Capillary: 160 mg/dL — ABNORMAL HIGH (ref 70–99)
Glucose-Capillary: 129 mg/dL — ABNORMAL HIGH (ref 70–99)

## 2019-03-11 LAB — MAGNESIUM: Magnesium: 2.5 mg/dL — ABNORMAL HIGH (ref 1.7–2.4)

## 2019-03-11 MED ORDER — IPRATROPIUM BROMIDE 0.02 % IN SOLN: 0.5000 mg | Freq: Three times a day (TID) | RESPIRATORY_TRACT | 12 refills | Status: DC

## 2019-03-11 MED ORDER — BUDESONIDE 0.5 MG/2ML IN SUSP: 0.5000 mg | Freq: Two times a day (BID) | RESPIRATORY_TRACT | 3 refills | Status: DC

## 2019-03-11 MED ORDER — ALBUTEROL SULFATE (2.5 MG/3ML) 0.083% IN NEBU: 2.5000 mg | INHALATION_SOLUTION | RESPIRATORY_TRACT | 12 refills | Status: DC | PRN

## 2019-03-11 MED ORDER — TRAMADOL HCL 50 MG PO TABS: 50.0000 mg | ORAL_TABLET | Freq: Four times a day (QID) | ORAL | 0 refills | Status: DC | PRN

## 2019-03-11 MED ORDER — LEVALBUTEROL HCL 0.63 MG/3ML IN NEBU: 0.6300 mg | INHALATION_SOLUTION | Freq: Three times a day (TID) | RESPIRATORY_TRACT | 12 refills | Status: DC

## 2019-03-11 MED ORDER — GUAIFENESIN ER 600 MG PO TB12: 600.0000 mg | ORAL_TABLET | Freq: Two times a day (BID) | ORAL | 1 refills | Status: AC

## 2019-03-11 MED ORDER — INSULIN DETEMIR 100 UNIT/ML ~~LOC~~ SOLN: 15.0000 [IU] | Freq: Two times a day (BID) | SUBCUTANEOUS | 11 refills | Status: DC

## 2019-03-11 MED ORDER — UMECLIDINIUM BROMIDE 62.5 MCG/INH IN AEPB: 1.0000 | INHALATION_SPRAY | Freq: Every day | RESPIRATORY_TRACT | 1 refills | Status: AC

## 2019-03-11 MED ORDER — PERFOROMIST 20 MCG/2ML IN NEBU: 20.0000 ug | INHALATION_SOLUTION | Freq: Two times a day (BID) | RESPIRATORY_TRACT | 2 refills | Status: DC

## 2019-03-11 NOTE — Care Management Important Message (Signed)
Important Message  Patient Details  Name: Amy Shepard MRN: [id number] Date of Birth: 1946/05/10   Medicare Important Message Given:  Yes     Memory Argue 03/11/2019, 1:42 PM

## 2019-03-11 NOTE — Progress Notes (Signed)
Subjective: 8 Days Post-Op Procedure(s) (LRB): RIGHT HEART CATH (N/A) Patient reports pain as mild.    Objective: Vital signs in last 24 hours: Temp:  [97.6 F (36.4 C)-98.5 F (36.9 C)] 98.1 F (36.7 C) (08/28 0803) Pulse Rate:  [72-80] 80 (08/28 0803) Resp:  [11-21] 19 (08/28 0803) BP: (101-121)/(52-66) 101/54 (08/28 0803) SpO2:  [90 %-96 %] 90 % (08/28 0803) FiO2 (%):  [44 %] 44 % (08/27 2001) Weight:  [76.3 kg] 76.3 kg (08/28 0325)  Intake/Output from previous day: 08/27 0701 - 08/28 0700 In: 11 [P.O.:1102] Out: 401 [Urine:400; Stool:1] Intake/Output this shift: Total I/O In: 240 [P.O.:240] Out: -   No results for input(s): HGB in the last 72 hours. No results for input(s): WBC, RBC, HCT, PLT in the last 72 hours. Recent Labs    03/09/19 0205 03/10/19 0200  NA 139 139  K 4.7 4.5  CL 96* 99  CO2 33* 30  BUN 56* 51*  CREATININE 1.23* 1.37*  GLUCOSE 187* 65*  CALCIUM 9.2 8.9   No results for input(s): LABPT, INR in the last 72 hours.  Right ankle incision with scant heme drainage. No signs of infection or cellulitis.  Up in chair on 6L O2 [place] and feels well.   Assessment/Plan: 8 Days Post-Op Procedure(s) (LRB): RIGHT HEART CATH (N/A) Right ankle fusion- Follow up in the office in 2 weeks.  Respiratory failure- Seems to be nearing her baseline.HH O2 ordered.  Plan DC home today with DME as noted, hospital bed, bedside commode, [facility] RN and PT.   Erlinda Hong, PA-C 03/11/2019, 8:36 AM  [facility] [phone]

## 2019-03-11 NOTE — Progress Notes (Signed)
Physical Therapy Treatment Patient Details Name: Amy Shepard MRN: [id number] DOB: May 04, 1946 Today's Date: 03/11/2019    History of Present Illness 73 yo 10wks s/p trimalleolar Rt ankle fx with syndesmotic disruption. 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 7/31 hardware removal with ankle fusion, wound VAC. PMHx: lung CA, CAD, spinal fusion, depression, CHF, pulmonary HTN, CKD    PT Comments    Pt admitted with above diagnosis. Pt was able to pivot from wheelchair to the 3N1 without assist and locked brakes appropriately.  Discussed energy conservation at length.  Also went togift shop for pt to purchase an ADL kit for her.   Pt currently with functional limitations due to the deficits listed below (see PT Problem List). Pt will benefit from skilled PT to increase their independence and safety with mobility to allow discharge to the venue listed below.     Follow Up Recommendations  Home health PT(HH aide, HHOT, [place])     Equipment Recommendations  Other (comment)(obtained ADL kit)    Recommendations for Other Services OT consult     Precautions / Restrictions Precautions Precautions: Fall Precaution Comments: watch O2, energy conservation techniques Required Braces or Orthoses: Other Brace Other Brace: CAM boot RLE Restrictions Weight Bearing Restrictions: No RUE Weight Bearing: Weight bearing as tolerated LUE Weight Bearing: Weight bearing as tolerated RLE Weight Bearing: Weight bearing as tolerated RLE Partial Weight Bearing Percentage or Pounds: body LLE Weight Bearing: Weight bearing as tolerated Other Position/Activity Restrictions: okay for ambulation per MD 03/08/19    Mobility  Bed Mobility               General bed mobility comments: OOB in recliner upon entry   Transfers Overall transfer level: Needs assistance   Transfers: Squat Pivot Transfers;Sit to/from Stand Sit to Stand: Supervision   Squat pivot transfers:  Supervision     General transfer comment: assist for lines   Ambulation/Gait                 Stairs             Wheelchair Mobility    Modified Rankin (Stroke Patients Only)       Balance Overall balance assessment: Needs assistance Sitting-balance support: No upper extremity supported;Feet supported Sitting balance-Leahy Scale: Good                                      Cognition Arousal/Alertness: Awake/alert Behavior During Therapy: WFL for tasks assessed/performed Overall Cognitive Status: Within Functional Limits for tasks assessed                                 General Comments: pt able to restate energy conservation strategies as educated at end of session      Exercises      General Comments General comments (skin integrity, edema, etc.): extensive education about rest breaks, doing as much of IADL's in sitting position in w/c.  Went over entire [employer].  Also discussed ADL equipment and pt wanted kit from the gift shop therefore PTwent and obtained the kit for pt.       Pertinent Vitals/Pain Pain Assessment: No/denies pain    Home Living                      Prior Function  PT Goals (current goals can now be found in the care plan section) Acute Rehab PT Goals Patient Stated Goal: to go home Progress towards PT goals: Progressing toward goals    Frequency    Min 3X/week      PT Plan      Co-evaluation              AM-PAC PT "6 Clicks" Mobility   Outcome Measure  Help needed turning from your back to your side while in a flat bed without using bedrails?: None Help needed moving from lying on your back to sitting on the side of a flat bed without using bedrails?: None Help needed moving to and from a bed to a chair (including a wheelchair)?: A Little Help needed standing up from a chair using your arms (e.g., wheelchair or bedside chair)?: None Help  needed to walk in hospital room?: A Little Help needed climbing 3-5 steps with a railing? : A Lot 6 Click Score: 20    End of Session Equipment Utilized During Treatment: Oxygen Activity Tolerance: Patient tolerated treatment well Patient left: with call bell/phone within reach(left on 3N1) Nurse Communication: Mobility status PT Visit Diagnosis: Muscle weakness (generalized) (M62.81);Other abnormalities of gait and mobility (R26.89)     Time: 1448-1856 PT Time Calculation (min) (ACUTE ONLY): 31 min  Charges:  $Therapeutic Activity: 8-22 mins $Self Care/Home Management: 8-22                     [place] Pager:  [phone]  Office:  [place] 03/11/2019, 12:51 PM

## 2019-03-11 NOTE — Telephone Encounter (Signed)
Patient has hospital follow-up with my on 03/22/19. Contacted by Pharmacist, she is on multiple duplicates. Needs medication reconciliation at visit. Called patient, no answer.  Recommend: - Continue Dulera 2 puffs twice daily OR pulmicort/brovana twice daily - Incruse 1 puff daily  - As needed duonebs every 6 hours for wheezing/sob  - She also should only be on one SSRI and needs follow-up with PCP. Recommend resuming zoloft OR celexa

## 2019-03-11 NOTE — Discharge Summary (Signed)
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 female who was admitted 02/11/2019 with a chief complaint of non union right ankle fracture.  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  She tolerated her orthopedic surgery well and is weightbearing as tolerated in a fracture boot at this point. She had a past medical history of coronary disease, chronic kidney disease, and COPD with chronic hypoxic hypercapnic respiratory failure, pulmonary hypertension with cor pulmonale, and lung cancer, status post right lower lobe superior sympathectomy with adjuvant chemotherapy back in 2010. She was chronically on oxygen at home. Postoperatively she did developed acute worsening of her respiratory failure.  The patient was seen in consultation by the critical care/ pulmonary service and her bronchodilators were maximized and she was started on a steroid taper as well as empiric Zithromax.  She was also seen by cardiology for her  pulmonary hypertension/acute on chronic diastolic heart failure.  She underwent a right heart cath on 03/03/2019 and this showed mild PAH with well compensated filling pressures.  She was appropriately diuresed. Eventually she improved and was able to be tapered off of high flow oxygen and is currently on 6 L of oxygen per minute per nasal cannula.  She does have some ongoing debility and it was recommended that she have a hospital bed due to her chronic respiratory failure to assist with her mobility at home as well as a bedside 3 in 1 commode and other equipment that she already has.  Will plan early follow-up with pulmonary.   Patient was brought to the operating room on 03/03/2019 and underwent Procedure(s): RIGHT HEART CATH.    Patient was given perioperative antibiotics:  Anti-infectives (From admission, onward)   Start     Dose/Rate Route Frequency Ordered Stop   02/25/19 1915  azithromycin (ZITHROMAX) tablet 500 mg  Status:  Discontinued     500 mg Oral Daily 02/25/19 1912 02/27/19 0904   02/21/19 1200  azithromycin (ZITHROMAX) 500 mg in sodium chloride 0.9 % 250 mL IVPB  Status:  Discontinued     500 mg 250 mL/hr over 60 Minutes Intravenous Every 24 hours 02/21/19 1107 02/25/19 1912   02/11/19 0630  vancomycin (VANCOCIN) IVPB 1000 mg/200 mL premix     1,000 mg 200 mL/hr over 60 Minutes Intravenous To Short Stay 02/10/19 0757 02/11/19 0718   02/10/19 0800  vancomycin (VANCOCIN) IVPB 1000 mg/200 mL premix  Status:  Discontinued     1,000 mg 200 mL/hr over 60 Minutes Intravenous To Short Stay  02/10/19 0755 02/10/19 0757    .  Patient was given sequential compression devices, early ambulation, and aspirin for DVT prophylaxis.  Recent vital signs:  Patient Vitals for the past 24 hrs:  BP Temp Temp src Pulse Resp SpO2 Weight  03/11/19 0854 (!) 101/54 - - - 17 91 % -  03/11/19 0803 (!) 101/54 98.1 F (36.7 C) Oral 80 19 90 % -  03/11/19 0325 - - - - - - 76.3 kg  03/11/19 0316 (!)  121/52 98.5 F (36.9 C) Oral 72 14 91 % -  03/10/19 2326 (!) 118/58 98.5 F (36.9 C) Oral 75 17 95 % -  03/10/19 2053 - - - - - 94 % -  03/10/19 2001 115/66 98.3 F (36.8 C) Oral 72 (!) 21 95 % -  03/10/19 1608 (!) 110/54 97.6 F (36.4 C) Axillary 74 11 96 % -  03/10/19 1338 - - - - - 96 % -  03/10/19 1100 112/64 98 F (36.7 C) Oral 73 15 93 % -  .  Recent laboratory studies: No results found.  Discharge Medications:   Allergies as of 03/11/2019      Reactions   Amoxicillin Anaphylaxis, Hives, Rash, Other (See Comments)   Has patient had a PCN reaction causing immediate rash, facial/tongue/throat swelling, SOB or lightheadedness with hypotension: YES Positive reaction causing SEVERE RASH INVOLVING MUCUS MEMBRANES/SKIN NECROSIS: YES Reaction that required HOSPITALIZATION: YES Reaction occurring within the last 10 years: NO      Medication List    STOP taking these medications   diclofenac sodium 1 % Gel Commonly known as: VOLTAREN   ipratropium-albuterol 0.5-2.5 (3) MG/3ML Soln Commonly known as: DUONEB     TAKE these medications   acetaminophen 325 MG tablet Commonly known as: TYLENOL Take 650 mg by mouth every 6 (six) hours as needed for moderate pain or fever.   albuterol (2.5 MG/3ML) 0.083% nebulizer solution Commonly known as: PROVENTIL Take 3 mLs (2.5 mg total) by nebulization every 4 (four) hours as needed for wheezing or shortness of breath.   atorvastatin 40 MG tablet Commonly known as: LIPITOR Take 1 tablet (40 mg total) by mouth daily.   azelastine 0.1 % nasal spray Commonly known as: ASTELIN Place 1 spray into both nostrils 2 (two) times daily. Use in each nostril as directed   budesonide 0.5 MG/2ML nebulizer solution Commonly known as: PULMICORT Take 2 mLs (0.5 mg total) by nebulization 2 (two) times daily.   citalopram 10 MG tablet Commonly known as: CELEXA TAKE 1 TABLET BY MOUTH EVERY DAY   docusate sodium 100 MG capsule Commonly known as:  COLACE Take 1 capsule (100 mg total) by mouth 2 (two) times daily.   doxycycline 100 MG capsule Commonly known as: VIBRAMYCIN Take 1 capsule (100 mg total) by mouth 2 (two) times daily.   furosemide 40 MG tablet Commonly known as: LASIX Take 1.5 tablets (60 mg total) by mouth 2 (two) times daily. What changed: how much to take   gabapentin 300 MG capsule Commonly known as: NEURONTIN Take 1 capsule (300 mg total) by mouth at bedtime. What changed: when to take this   guaiFENesin 600 MG 12 hr tablet Commonly known as: MUCINEX Take 1 tablet (600 mg total) by mouth 2 (two) times daily.   insulin detemir 100 UNIT/ML injection Commonly known as: LEVEMIR Inject 0.15 mLs (15 Units total) into the skin 2 (two) times daily. What changed:   how much to take  when to take  this   insulin lispro 100 UNIT/ML injection Commonly known as: HUMALOG Inject 2-11 Units into the skin 3 (three) times daily before meals.   ipratropium 0.02 % nebulizer solution Commonly known as: ATROVENT Take 2.5 mLs (0.5 mg total) by nebulization 3 (three) times daily.   iron polysaccharides 150 MG capsule Commonly known as: NIFEREX Take 1 capsule (150 mg total) by mouth 2 (two) times daily.   levalbuterol 0.63 MG/3ML nebulizer solution Commonly known as: XOPENEX Take 3 mLs (0.63 mg total) by nebulization 3 (three) times daily.   macitentan 10 MG tablet Commonly known as: Opsumit Take 1 tablet (10 mg total) by mouth daily.   midodrine 5 MG tablet Commonly known as: PROAMATINE Take 1 tablet (5 mg total) by mouth 3 (three) times daily with meals.   mometasone-formoterol 200-5 MCG/ACT Aero Commonly known as: DULERA Inhale 2 puffs into the lungs 2 (two) times daily.   OXYGEN Inhale 3 L into the lungs continuous. FOR COPD   Perforomist 20 MCG/2ML nebulizer solution Generic drug: formoterol Take 2 mLs (20 mcg total) by nebulization 2 (two) times daily.   polyethylene glycol 17 g packet Commonly  known as: MIRALAX / GLYCOLAX Take 17 g by mouth daily as needed for mild constipation.   sertraline 50 MG tablet Commonly known as: ZOLOFT Take 75 mg by mouth daily.   sodium chloride 0.65 % Soln nasal spray Commonly known as: OCEAN Place 1 spray into both nostrils as needed for congestion.   traMADol 50 MG tablet Commonly known as: ULTRAM Take 1 tablet (50 mg total) by mouth every 6 (six) hours as needed for moderate pain.   Trulicity 1.5 PJ/0.3PR Sopn Generic drug: Dulaglutide Inject 1.5 mg into the vein once a week.   umeclidinium bromide 62.5 MCG/INH Aepb Commonly known as: INCRUSE ELLIPTA Inhale 1 puff into the lungs daily.   Uptravi 600 MCG Tabs Generic drug: Selexipag Take 600 mcg by mouth 2 (two) times daily. What changed:   medication strength  how much to take  when to take this            Durable Medical Equipment  (From admission, onward)         Start     Ordered   03/11/19 0828  For home use only DME Bedside commode  Once    Question:  Patient needs a bedside commode to treat with the following condition  Answer:  Chronic respiratory failure with hypoxia, on home O2 therapy ([place])   03/11/19 9458   03/11/19 0826  For home use only DME oxygen  Once    Question Answer Comment  Length of Need Lifetime   Mode or (Route) Nasal cannula   Liters per Minute 6   Frequency Continuous (stationary and portable oxygen unit needed)   Oxygen conserving device Yes   Oxygen delivery system Gas      03/11/19 0834   03/11/19 0822  For home use only DME Hospital bed  Once    Question Answer Comment  Length of Need 6 Months   Patient has (list medical condition): chronic respiratory failure   The above medical condition requires: Patient requires the ability to reposition frequently   Head must be elevated greater than: 45 degrees   Bed type Semi-electric      03/11/19 0834   03/09/19 1509  For home use only DME Hospital bed  Once    Question Answer Comment   Length of Need 6 Months   The above  medical condition requires: Patient requires the ability to reposition frequently   Bed type Semi-electric   Trapeze Bar Yes      03/09/19 1509          Diagnostic Studies: Dg Chest 1 View  Result Date: 02/13/2019 CLINICAL DATA:  Pt complains of difficulty breathing and SOB. Hx of pulmonary HTN and COPD. Pt in hospital for surgery to her ankle. EXAM: CHEST  1 VIEW COMPARISON:  12/10/18, CT 01/04/2019 FINDINGS: Enlarged cardiac silhouette. Lungs are hyperinflated. Bands of atelectasis similar comparison CT. Chronic bronchitic markings are present. New 7 mm RIGHT upper lobe pulmonary nodule. LEFT basilar atelectasis. No focal consolidation IMPRESSION: 1. New RIGHT upper lobe pulmonary nodule. Recommend follow-up radiograph or CT evaluation. 2. Cardiomegaly and LEFT lingular atelectasis. 3. Chronic bronchitic markings. Electronically Signed   By: Suzy Bouchard M.D.   On: 02/13/2019 18:04   Dg Chest Port 1 View  Result Date: 03/02/2019 CLINICAL DATA:  Shortness of breath EXAM: PORTABLE CHEST 1 VIEW COMPARISON:  02/28/2019 FINDINGS: Cardiac shadow is stable. Persistent left basilar atelectasis is seen. The right lung remains clear. Aortic calcifications are noted. No bony abnormality is seen. IMPRESSION: Stable left basilar atelectasis. Electronically Signed   By: Inez Catalina M.D.   On: 03/02/2019 08:00   Dg Chest Port 1 View  Result Date: 02/28/2019 CLINICAL DATA:  Cough, shortness of breath. EXAM: PORTABLE CHEST 1 VIEW COMPARISON:  Radiographs of February 27, 2019. FINDINGS: Stable cardiomediastinal silhouette. Atherosclerosis of thoracic aorta is noted. No pneumothorax is noted. Right lung is clear. Mild left basilar opacity is noted laterally suggesting scarring or subsegmental atelectasis. Bony thorax is unremarkable. IMPRESSION: Mild left basilar subsegmental atelectasis or scarring is noted. Aortic Atherosclerosis (ICD10-I70.0). Electronically Signed    By: Marijo Conception M.D.   On: 02/28/2019 08:07   Dg Chest Port 1 View  Result Date: 02/27/2019 CLINICAL DATA:  Shortness of breath EXAM: PORTABLE CHEST 1 VIEW COMPARISON:  Yesterday FINDINGS: Cardiac enlargement that is stable. Diffuse interstitial opacity that is stable. Postoperative right lung volume loss. No Kerley lines, effusion, or pneumothorax. IMPRESSION: 1. Stable chest. 2. Cardiomegaly and chronic lung disease without acute superimposed finding. Electronically Signed   By: Monte Fantasia M.D.   On: 02/27/2019 07:51   Dg Chest Port 1 View  Result Date: 02/26/2019 CLINICAL DATA:  Shortness of breath. EXAM: PORTABLE CHEST 1 VIEW COMPARISON:  02/24/2019 FINDINGS: Stable cardiomegaly. Slightly decreased lung volumes. Hazy densities in the periphery of the right lower chest are nonspecific. Probable old right lower rib fractures. Atherosclerotic calcifications at the aortic arch. No frank pulmonary edema. Postoperative changes in the right lung. IMPRESSION: Concern for new densities along the periphery of the right lower lung. Findings could be related to overlying shadows or atelectasis but cannot exclude developing airspace disease. Recommend continued follow-up. Electronically Signed   By: Markus Daft M.D.   On: 02/26/2019 09:50   Dg Chest Port 1 View  Result Date: 02/24/2019 CLINICAL DATA:  Respiratory failure EXAM: PORTABLE CHEST 1 VIEW COMPARISON:  02/21/2019 FINDINGS: Cardiac shadow remains enlarged. Aortic calcifications are again seen. The lungs are well aerated bilaterally with mild left basilar opacities improved from the prior exam. Small effusion is noted. Mild vascular congestion is seen. IMPRESSION: Persistent but improved left basilar infiltrate with effusion. Electronically Signed   By: Inez Catalina M.D.   On: 02/24/2019 08:35   Dg Chest Port 1 View  Result Date: 02/21/2019 CLINICAL DATA:  Hypoxia. EXAM: PORTABLE CHEST 1  VIEW COMPARISON:  Radiographs of February 15, 2019.  FINDINGS: Stable cardiomegaly. Atherosclerosis of thoracic aorta is noted. No pneumothorax is noted. Mild right midlung subsegmental atelectasis is noted. Left basilar opacity is noted concerning for atelectasis or infiltrate with associated pleural effusion. Bony thorax is unremarkable. IMPRESSION: Stable left basilar opacity is noted concerning for atelectasis or infiltrate with associated effusion. Mild right midlung subsegmental atelectasis is noted. Aortic Atherosclerosis (ICD10-I70.0). Electronically Signed   By: Marijo Conception M.D.   On: 02/21/2019 10:06   Dg Chest Port 1 View  Result Date: 02/15/2019 CLINICAL DATA:  73 year old female with shortness of breath. Lung cancer. EXAM: PORTABLE CHEST 1 VIEW COMPARISON:  8220 and earlier. FINDINGS: Portable AP semi upright view at 0614 hours. Stable lung volumes. Stable cardiac size and mediastinal contours. Visualized tracheal air column is within normal limits. Increased dense left lung base opacity laterally, obscuring most of the left hemidiaphragm now. But at the same time bilateral increased pulmonary interstitial opacity appears regressed. Possible small right pleural effusion. No pneumothorax. Paucity of bowel gas in the upper abdomen. No acute osseous abnormality identified. IMPRESSION: 1. Worsening ventilation at the left lung base could reflect progressive consolidation or pleural effusion. 2. At the same time there is regressed vascularity and/or interstitial markings since 02/13/2019. 3. Possible small right effusion. Electronically Signed   By: Genevie Ann M.D.   On: 02/15/2019 08:00   Dg Knee Left Port  Result Date: 02/21/2019 CLINICAL DATA:  Left knee pain without injury. EXAM: PORTABLE LEFT KNEE - 1-2 VIEW COMPARISON:  None. FINDINGS: No fracture or dislocation is noted. Moderate suprapatellar joint effusion is noted. Vascular calcifications are noted. Irregularity is seen involving the medial femoral condyle consistent with osteochondral injury  or osteochondritis dissecans. Moderate degenerative changes seen in this joint space. IMPRESSION: Moderate suprapatellar joint effusion. 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 No acute fracture or dislocation is noted. Electronically Signed   By: Marijo Conception M.D.   On: 02/21/2019 08:28       Disposition: Discharge disposition: 01-Home or Self Care      Discharge Instructions    Call MD / Call 911   Complete by: As directed    If you experience chest pain or shortness of breath, CALL 911 and be transported to the hospital emergency room.  If you develope a fever above 101 F, pus (white drainage) or increased drainage or redness at the wound, or calf pain, call your surgeon's office.   Constipation Prevention   Complete by: As directed    Drink plenty of fluids.  Prune juice may be helpful.  You may use a stool softener, such as Colace (over the counter) 100 mg twice a day.  Use MiraLax (over the counter) for constipation as needed.   Diet - low sodium heart healthy   Complete by: As directed    Discharge instructions   Complete by: As directed    Right leg is Weight bearing as tolerated in fracture boot.   Increase activity slowly as tolerated   Complete by: As directed      Follow-up Information    Martyn Ehrich, NP Follow up on 03/23/2019.   Specialty: Pulmonary Disease Why: at 3pm   Contact information: [place] [address] [phone]        [facility] Follow up.   Why: home oxygen, hospital bed, BSC  [facility] Follow up.   Why: RN, PT       Newt Minion, MD Follow up in 2 week(s).   Specialty: Orthopedic Surgery Contact information: [place] [address] [phone]            Signed: Erlinda Hong, Hershal Coria 03/11/2019, 9:06 AM  [place] [phone]

## 2019-03-11 NOTE — TOC Transition Note (Addendum)
Transition of Care [facility]) - CM/SW Discharge Note   Patient Details  Name: Amy Shepard MRN: [id number] Date of Birth: 15-Aug-1945  Transition of [facility]) CM/SW Contact:  Zenon Mayo, RN Phone Number: 03/11/2019, 9:39 AM   Clinical Narrative:    Patient is for dc today, she has been set up with [facility], HHPT with [facility] , Butch Penny notified of dc today, referral was made to [facility] with [place] for hospital bed, BSC and home oxygen for E tanks.  Patient states to call [phone] for her transportation home when ready.  The DME will be delivered to her house. NCM called [place] [facility] for patient they state it will be one to 3 hours before pickup.  NCM informed Occupational hygienist.    Final next level of care: [place] Barriers to Discharge: No Barriers Identified   Patient Goals and CMS Choice Patient states their goals for this hospitalization and ongoing recovery are:: get better CMS Medicare.gov Compare Post Acute Care list provided to:: Patient Choice offered to / list presented to : Patient  Discharge Placement                       Discharge Plan and Services In-house Referral: NA Discharge Planning Services: CM Consult Post Acute Care Choice: Long Term Acute Care (LTAC)          DME Arranged: Bedside commode, Hospital bed, Oxygen DME Agency: [facility] Date DME Agency Contacted: 03/10/19 Time DME Agency Contacted: 1500 Representative spoke with at DME Agency: [place]: RN, PT [place] Agency: [place] ([place]) Date [place]: 03/11/19 Time [place]: [phone] Representative spoke with at [name]: [place] ([place]) Interventions     Readmission Risk Interventions Readmission Risk Prevention Plan 11/29/2018  Transportation Screening Complete  PCP or Specialist Appt within 3-5 Days Complete  HRI or [place] Complete  Social Work Consult for [place]  Planning/Counseling Complete  Palliative Care Screening Not Applicable  Medication Review Press photographer) Complete  Some recent data might be hidden

## 2019-03-11 NOTE — Progress Notes (Signed)
Patient provided with verbal discharge instructions. Paper copy of AVS provided to patient. No further questions from patient. IV removed per orders. VSS at discharge. Patient belongings sent with patient. Pt discharge via personal wheelchair through N tower entrance to Surveyor, quantity.

## 2019-03-11 NOTE — Telephone Encounter (Signed)
Spoke with the pt  Advised her of the recommendations per [facility]  She verbalized understanding  She states she has not picked up the incruse yet but she is going to  She is taking the Dulera bid  I advised to not take the pulmicort or brovana in this case  She knows she can take duoneb prn  She is going to take sertraline as her SSRI and is aware to not take celexa with this  Pt given appt date, time and location and will call sooner if needed

## 2019-03-17 ENCOUNTER — Telehealth: Payer: Self-pay | Admitting: Primary Care

## 2019-03-17 NOTE — Telephone Encounter (Signed)
Returned call to patient.  Reviewed discharge instructions and medication sheets with the patient over the phone referring to page by page for review.  Patient is new start on nebulizer and wasn't clear on how many times a day to use each medication.  Patient acknowledged understanding when reviewing this by phone but suggest follow up with home health for further instructions.  Advised patient the med sheet on discharge shows her each med she should be taking and how to use it.    Also let patient know the Performist was sent to pharmacy.  Patient had recently changed pharmacies and will contact her new pharmacy to request the prescription from [facility] where it was sent.  Patient advised to follow up with home health or [place] if she has further questions or concerns.  Nothing further needed.

## 2019-03-18 ENCOUNTER — Telehealth: Payer: Self-pay | Admitting: Pulmonary Disease

## 2019-03-18 NOTE — Telephone Encounter (Signed)
Spoke with Eustaquio Maize, who is a Emergency planning/management officer with [employer]. She is inquiring about how much oxygen the pt should be using. Advised her that at the pt's OV, we advised 3-5L. Nothing further was needed.

## 2019-03-22 ENCOUNTER — Emergency Department (HOSPITAL_COMMUNITY): Payer: Medicare Other

## 2019-03-22 ENCOUNTER — Inpatient Hospital Stay (HOSPITAL_COMMUNITY)
Admission: EM | Admit: 2019-03-22 | Discharge: 2019-03-29 | DRG: 312 | Disposition: A | Discharge status: Skilled Nursing Facility | Payer: Medicare Other | Attending: Internal Medicine | Admitting: Internal Medicine

## 2019-03-22 ENCOUNTER — Inpatient Hospital Stay: Payer: Medicare Other | Admitting: Primary Care

## 2019-03-22 ENCOUNTER — Telehealth: Payer: Self-pay

## 2019-03-22 DIAGNOSIS — E118 Type 2 diabetes mellitus with unspecified complications: Secondary | ICD-10-CM

## 2019-03-22 DIAGNOSIS — T84038A Mechanical loosening of other internal prosthetic joint, initial encounter: Secondary | ICD-10-CM | POA: Diagnosis present

## 2019-03-22 DIAGNOSIS — Z833 Family history of diabetes mellitus: Secondary | ICD-10-CM

## 2019-03-22 DIAGNOSIS — I5082 Biventricular heart failure: Secondary | ICD-10-CM | POA: Diagnosis present

## 2019-03-22 DIAGNOSIS — I2781 Cor pulmonale (chronic): Secondary | ICD-10-CM | POA: Diagnosis present

## 2019-03-22 DIAGNOSIS — I951 Orthostatic hypotension: Principal | ICD-10-CM

## 2019-03-22 DIAGNOSIS — D631 Anemia in chronic kidney disease: Secondary | ICD-10-CM | POA: Diagnosis present

## 2019-03-22 DIAGNOSIS — Z6832 Body mass index (BMI) 32.0-32.9, adult: Secondary | ICD-10-CM

## 2019-03-22 DIAGNOSIS — Z9981 Dependence on supplemental oxygen: Secondary | ICD-10-CM

## 2019-03-22 DIAGNOSIS — E785 Hyperlipidemia, unspecified: Secondary | ICD-10-CM | POA: Diagnosis present

## 2019-03-22 DIAGNOSIS — Z79891 Long term (current) use of opiate analgesic: Secondary | ICD-10-CM

## 2019-03-22 DIAGNOSIS — Z87891 Personal history of nicotine dependence: Secondary | ICD-10-CM

## 2019-03-22 DIAGNOSIS — I10 Essential (primary) hypertension: Secondary | ICD-10-CM | POA: Diagnosis not present

## 2019-03-22 DIAGNOSIS — Z794 Long term (current) use of insulin: Secondary | ICD-10-CM

## 2019-03-22 DIAGNOSIS — I130 Hypertensive heart and chronic kidney disease with heart failure and stage 1 through stage 4 chronic kidney disease, or unspecified chronic kidney disease: Secondary | ICD-10-CM | POA: Diagnosis present

## 2019-03-22 DIAGNOSIS — Z79899 Other long term (current) drug therapy: Secondary | ICD-10-CM

## 2019-03-22 DIAGNOSIS — J9621 Acute and chronic respiratory failure with hypoxia: Secondary | ICD-10-CM | POA: Diagnosis not present

## 2019-03-22 DIAGNOSIS — I2510 Atherosclerotic heart disease of native coronary artery without angina pectoris: Secondary | ICD-10-CM | POA: Diagnosis present

## 2019-03-22 DIAGNOSIS — E1140 Type 2 diabetes mellitus with diabetic neuropathy, unspecified: Secondary | ICD-10-CM | POA: Diagnosis not present

## 2019-03-22 DIAGNOSIS — I2721 Secondary pulmonary arterial hypertension: Secondary | ICD-10-CM | POA: Diagnosis present

## 2019-03-22 DIAGNOSIS — E860 Dehydration: Secondary | ICD-10-CM | POA: Diagnosis present

## 2019-03-22 DIAGNOSIS — J449 Chronic obstructive pulmonary disease, unspecified: Secondary | ICD-10-CM | POA: Diagnosis present

## 2019-03-22 DIAGNOSIS — R42 Dizziness and giddiness: Secondary | ICD-10-CM | POA: Diagnosis not present

## 2019-03-22 DIAGNOSIS — E11610 Type 2 diabetes mellitus with diabetic neuropathic arthropathy: Secondary | ICD-10-CM | POA: Diagnosis not present

## 2019-03-22 DIAGNOSIS — Z09 Encounter for follow-up examination after completed treatment for conditions other than malignant neoplasm: Secondary | ICD-10-CM

## 2019-03-22 DIAGNOSIS — L03115 Cellulitis of right lower limb: Secondary | ICD-10-CM

## 2019-03-22 DIAGNOSIS — E876 Hypokalemia: Secondary | ICD-10-CM | POA: Diagnosis not present

## 2019-03-22 DIAGNOSIS — I959 Hypotension, unspecified: Secondary | ICD-10-CM

## 2019-03-22 DIAGNOSIS — E669 Obesity, unspecified: Secondary | ICD-10-CM | POA: Diagnosis present

## 2019-03-22 DIAGNOSIS — F329 Major depressive disorder, single episode, unspecified: Secondary | ICD-10-CM | POA: Diagnosis present

## 2019-03-22 DIAGNOSIS — I272 Pulmonary hypertension, unspecified: Secondary | ICD-10-CM

## 2019-03-22 DIAGNOSIS — E7800 Pure hypercholesterolemia, unspecified: Secondary | ICD-10-CM | POA: Diagnosis present

## 2019-03-22 DIAGNOSIS — I5032 Chronic diastolic (congestive) heart failure: Secondary | ICD-10-CM | POA: Diagnosis present

## 2019-03-22 DIAGNOSIS — J439 Emphysema, unspecified: Secondary | ICD-10-CM | POA: Diagnosis not present

## 2019-03-22 DIAGNOSIS — Z20828 Contact with and (suspected) exposure to other viral communicable diseases: Secondary | ICD-10-CM | POA: Diagnosis present

## 2019-03-22 DIAGNOSIS — I50810 Right heart failure, unspecified: Secondary | ICD-10-CM

## 2019-03-22 DIAGNOSIS — Z981 Arthrodesis status: Secondary | ICD-10-CM

## 2019-03-22 DIAGNOSIS — E1122 Type 2 diabetes mellitus with diabetic chronic kidney disease: Secondary | ICD-10-CM | POA: Diagnosis present

## 2019-03-22 DIAGNOSIS — N183 Chronic kidney disease, stage 3 (moderate): Secondary | ICD-10-CM | POA: Diagnosis present

## 2019-03-22 DIAGNOSIS — S82831A Other fracture of upper and lower end of right fibula, initial encounter for closed fracture: Secondary | ICD-10-CM | POA: Diagnosis present

## 2019-03-22 DIAGNOSIS — Z85118 Personal history of other malignant neoplasm of bronchus and lung: Secondary | ICD-10-CM

## 2019-03-22 DIAGNOSIS — F419 Anxiety disorder, unspecified: Secondary | ICD-10-CM | POA: Diagnosis present

## 2019-03-22 DIAGNOSIS — Z8249 Family history of ischemic heart disease and other diseases of the circulatory system: Secondary | ICD-10-CM

## 2019-03-22 DIAGNOSIS — Z880 Allergy status to penicillin: Secondary | ICD-10-CM

## 2019-03-22 LAB — CBC WITH DIFFERENTIAL/PLATELET
Abs Immature Granulocytes: 0.02 10*3/uL (ref 0.00–0.07)
Basophils Absolute: 0 10*3/uL (ref 0.0–0.1)
Basophils Relative: 0 %
Eosinophils Absolute: 0.2 10*3/uL (ref 0.0–0.5)
Eosinophils Relative: 4 %
HCT: 31.4 % — ABNORMAL LOW (ref 36.0–46.0)
Hemoglobin: 9.6 g/dL — ABNORMAL LOW (ref 12.0–15.0)
Immature Granulocytes: 0 %
Lymphocytes Relative: 12 %
Lymphs Abs: 0.7 10*3/uL (ref 0.7–4.0)
MCH: 26.7 pg (ref 26.0–34.0)
MCHC: 30.6 g/dL (ref 30.0–36.0)
MCV: 87.2 fL (ref 80.0–100.0)
Monocytes Absolute: 0.5 10*3/uL (ref 0.1–1.0)
Monocytes Relative: 9 %
Neutro Abs: 4.3 10*3/uL (ref 1.7–7.7)
Neutrophils Relative %: 75 %
Platelets: 290 10*3/uL (ref 150–400)
RBC: 3.6 MIL/uL — ABNORMAL LOW (ref 3.87–5.11)
RDW: 18.4 % — ABNORMAL HIGH (ref 11.5–15.5)
WBC: 5.8 10*3/uL (ref 4.0–10.5)
nRBC: 0 % (ref 0.0–0.2)

## 2019-03-22 LAB — COMPREHENSIVE METABOLIC PANEL
ALT: 14 U/L (ref 0–44)
AST: 19 U/L (ref 15–41)
Albumin: 3.2 g/dL — ABNORMAL LOW (ref 3.5–5.0)
Alkaline Phosphatase: 104 U/L (ref 38–126)
Anion gap: 11 (ref 5–15)
BUN: 32 mg/dL — ABNORMAL HIGH (ref 8–23)
CO2: 28 mmol/L (ref 22–32)
Calcium: 8.8 mg/dL — ABNORMAL LOW (ref 8.9–10.3)
Chloride: 102 mmol/L (ref 98–111)
Creatinine, Ser: 1.35 mg/dL — ABNORMAL HIGH (ref 0.44–1.00)
GFR calc Af Amer: 45 mL/min — ABNORMAL LOW (ref >60)
GFR calc non Af Amer: 39 mL/min — ABNORMAL LOW (ref >60)
Glucose, Bld: 132 mg/dL — ABNORMAL HIGH (ref 70–99)
Potassium: 3.1 mmol/L — ABNORMAL LOW (ref 3.5–5.1)
Sodium: 141 mmol/L (ref 135–145)
Total Bilirubin: 0.8 mg/dL (ref 0.3–1.2)
Total Protein: 6.1 g/dL — ABNORMAL LOW (ref 6.5–8.1)

## 2019-03-22 LAB — URINALYSIS, ROUTINE W REFLEX MICROSCOPIC
Bilirubin Urine: NEGATIVE
Glucose, UA: NEGATIVE mg/dL
Hgb urine dipstick: NEGATIVE
Ketones, ur: NEGATIVE mg/dL
Leukocytes,Ua: NEGATIVE
Nitrite: NEGATIVE
Protein, ur: NEGATIVE mg/dL
Specific Gravity, Urine: 1.01 (ref 1.005–1.030)
pH: 6 (ref 5.0–8.0)

## 2019-03-22 LAB — CBG MONITORING, ED: Glucose-Capillary: 90 mg/dL (ref 70–99)

## 2019-03-22 LAB — SARS CORONAVIRUS 2 BY RT PCR (HOSPITAL ORDER, PERFORMED IN ~~LOC~~ HOSPITAL LAB): SARS Coronavirus 2: NEGATIVE

## 2019-03-22 LAB — LACTIC ACID, PLASMA: Lactic Acid, Venous: 0.6 mmol/L (ref 0.5–1.9)

## 2019-03-22 LAB — TROPONIN I (HIGH SENSITIVITY): Troponin I (High Sensitivity): 22 ng/L — ABNORMAL HIGH (ref <18)

## 2019-03-22 MED ORDER — SODIUM CHLORIDE 0.9 % IV BOLUS
500.0000 mL | Freq: Once | INTRAVENOUS | Status: AC
  Administered 2019-03-22: 500 mL via INTRAVENOUS

## 2019-03-22 MED ORDER — MIDODRINE HCL 5 MG PO TABS
7.5000 mg | ORAL_TABLET | Freq: Three times a day (TID) | ORAL | Status: DC
  Administered 2019-03-23 – 2019-03-29 (×20): 7.5 mg via ORAL
  Filled 2019-03-22 (×20): qty 2

## 2019-03-22 MED ORDER — PRO-STAT SUGAR FREE PO LIQD
30.0000 mL | Freq: Two times a day (BID) | ORAL | Status: DC
  Administered 2019-03-23 – 2019-03-29 (×12): 30 mL via ORAL
  Filled 2019-03-22 (×12): qty 30

## 2019-03-22 MED ORDER — INSULIN ASPART 100 UNIT/ML ~~LOC~~ SOLN
0.0000 [IU] | Freq: Three times a day (TID) | SUBCUTANEOUS | Status: DC
  Administered 2019-03-23: 2 [IU] via SUBCUTANEOUS
  Administered 2019-03-23 – 2019-03-24 (×4): 1 [IU] via SUBCUTANEOUS
  Administered 2019-03-24 – 2019-03-26 (×5): 2 [IU] via SUBCUTANEOUS
  Administered 2019-03-27: 1 [IU] via SUBCUTANEOUS
  Administered 2019-03-27: 2 [IU] via SUBCUTANEOUS

## 2019-03-22 MED ORDER — LEVOFLOXACIN IN D5W 750 MG/150ML IV SOLN
750.0000 mg | INTRAVENOUS | Status: DC
  Administered 2019-03-22: 750 mg via INTRAVENOUS
  Filled 2019-03-22: qty 150

## 2019-03-22 MED ORDER — POTASSIUM CHLORIDE CRYS ER 20 MEQ PO TBCR
40.0000 meq | EXTENDED_RELEASE_TABLET | Freq: Once | ORAL | Status: AC
  Administered 2019-03-22: 40 meq via ORAL
  Filled 2019-03-22: qty 2

## 2019-03-22 MED ORDER — SODIUM CHLORIDE 0.9% FLUSH: 3.0000 mL | INTRAVENOUS | Status: DC | PRN

## 2019-03-22 MED ORDER — SODIUM CHLORIDE 0.9% FLUSH
3.0000 mL | Freq: Two times a day (BID) | INTRAVENOUS | Status: DC
  Administered 2019-03-23 – 2019-03-29 (×13): 3 mL via INTRAVENOUS

## 2019-03-22 MED ORDER — SODIUM CHLORIDE 0.9 % IV BOLUS
500.0000 mL | Freq: Once | INTRAVENOUS | Status: AC
  Administered 2019-03-22: 19:00:00 500 mL via INTRAVENOUS

## 2019-03-22 MED ORDER — ACETAMINOPHEN 325 MG PO TABS
650.0000 mg | ORAL_TABLET | Freq: Four times a day (QID) | ORAL | Status: DC | PRN
  Administered 2019-03-23 – 2019-03-29 (×10): 650 mg via ORAL
  Filled 2019-03-22 (×10): qty 2

## 2019-03-22 MED ORDER — ACETAMINOPHEN 650 MG RE SUPP: 650.0000 mg | Freq: Four times a day (QID) | RECTAL | Status: DC | PRN

## 2019-03-22 MED ORDER — INSULIN ASPART 100 UNIT/ML ~~LOC~~ SOLN
0.0000 [IU] | Freq: Every day | SUBCUTANEOUS | Status: DC
  Administered 2019-03-26 – 2019-03-27 (×2): 2 [IU] via SUBCUTANEOUS

## 2019-03-22 MED ORDER — ENOXAPARIN SODIUM 40 MG/0.4ML ~~LOC~~ SOLN
40.0000 mg | SUBCUTANEOUS | Status: DC
  Administered 2019-03-23 – 2019-03-29 (×7): 40 mg via SUBCUTANEOUS
  Filled 2019-03-22 (×7): qty 0.4

## 2019-03-22 MED ORDER — CLINDAMYCIN HCL 150 MG PO CAPS
300.0000 mg | ORAL_CAPSULE | Freq: Once | ORAL | Status: AC
  Administered 2019-03-22: 300 mg via ORAL
  Filled 2019-03-22: qty 2

## 2019-03-22 MED ORDER — SODIUM CHLORIDE 0.9 % IV SOLN: 250.0000 mL | INTRAVENOUS | Status: DC | PRN

## 2019-03-22 NOTE — Progress Notes (Signed)
Pharmacy Antibiotic Note  Amy Shepard is a 74 y.o. female admitted on 03/22/2019 with cellulitis.  Pharmacy has been consulted for levaquin dosing due to anaphylactic penicillin allergy. Pt is afebrile and WBC is WNL. Scr is mildly elevated.   Plan: Levaquin 71m IV Q48H F/u renal fxn, C&S, clinical status  Narrow as able   Height: _0  (154.9 cm) Weight: 160 lb (72.6 kg) IBW/kg (Calculated) : 47.8  Temp (24hrs), Avg:97.8 F (36.6 C), Min:97.8 F (36.6 C), Max:97.8 F (36.6 C)  Recent Labs  Lab 03/22/19 1654  WBC 5.8  CREATININE 1.35*  LATICACIDVEN 0.6    Estimated Creatinine Clearance: 34.3 mL/min (A) (by C-G formula based on SCr of 1.35 mg/dL (H)).    Allergies  Allergen Reactions  . Amoxicillin Anaphylaxis, Hives, Rash and Other (See Comments)    Has patient had a PCN reaction causing immediate rash, facial/tongue/throat swelling, SOB or lightheadedness with hypotension: YES Positive reaction causing SEVERE RASH INVOLVING MUCUS MEMBRANES/SKIN NECROSIS: YES Reaction that required HOSPITALIZATION: YES Reaction occurring within the last 10 years: NO    Antimicrobials this admission: Levaquin 9/8>>  Dose adjustments this admission: N/A  Microbiology results: Pending  Thank you for allowing pharmacy to be a part of this patient's care.  Jossie Smoot, RRande Lawman9/02/2019 10:51 PM

## 2019-03-22 NOTE — ED Provider Notes (Addendum)
[facility] EMERGENCY DEPARTMENT Provider Note   CSN: [id number] Arrival date & time: 03/22/19  1438   History   Chief Complaint Chief Complaint  Patient presents with   Hypotension   HPI Amy Shepard is a 73 y.o. female with significant medical history difficult for CAD, CHF, chronic respiratory failure on 5 L nasal cannula, CKD, COPD, cor pulmonale, diabetes, prior foot fracture, prior lung cancer, hypertension, hypercholesterolemia, pericardial effusion who presents for evaluation multiple complaints.  Patient had right lower extremity foot surgery with Dr. Sharol Given approximately 1 month ago.  At home health nurse came out today to change her wounds and was noted to be hypotensive at 8452.  Patient states she has had dizziness and lightheadedness over the last 2 days.  Patient states she feels generally weak however denies any slurred speech, unilateral weakness.  She is on her chronic home oxygen of 5 L nasal cannula.  She has not noticed any fluid overload or increase in her weight.  She has had a headache without sudden onset thunderclap headache, slurred speech.  Some mild redness to her right lower extremity and has had some drainage from her prior surgical site.  She has a follow-up with Sharol Given this week.  No fever, chills, nausea, vomiting, chest pain, shortness of breath, abdominal pain, decreased range of motion, numbness or tingling in her extremities. States she has also had bilateral anesthesias to her bilateral hands x2 months.  Was followed by PCP for this and they referred her to an endocrinologist as they thought this was diabetic neuropathy.  She recently saw Dr. Buddy Duty, endocrinologist she changed her blood sugar regimen however this has not helped with her symptoms.  She is not able to describe her dizziness.  She states she feels "off."  History obtained from patient and past medical records.  No Interpreter is used.  PCPSadie Haber     HPI  Past Medical  History:  Diagnosis Date   Anemia    Anxiety    Arthritis    CAD (coronary artery disease)    a. Prior cath 2015 showed 40% prox AD, 50-50% mLAD, otherwise calcification but no obstruction in LCx/RCA.. Medical management recommended. b. 2017: low-risk NST.   Chronic diastolic CHF (congestive heart failure) ([name])    Chronic respiratory failure ([name])    CKD (chronic kidney disease), stage III ([name])    Complication of anesthesia    oxygen level dropped in last surgery 11/2018   COPD (chronic obstructive pulmonary disease) ([name])    Cor pulmonale ([name])    a. felt due to advanced COPD and noncompliance with O2.   Depression    Diabetes mellitus    januvia    dx  2008   Fracture    left foot   Hx of seasonal allergies    Hypercholesteremia    Hypertension    Lung cancer ([place])    On home oxygen therapy    "2.5L; 24/7" (03/10/2017)   Pericardial effusion    a. small-moderate in 07/2016.   Pneumonia    Pulmonary hypertension ([name])    UTI (urinary tract infection)     Patient Active Problem List   Diagnosis Date Noted   Dizziness 03/22/2019   Hypoxemia    Palliative care by specialist    DNR (do not resuscitate) discussion    Right upper lobe pulmonary nodule 02/13/2019   Closed right ankle fracture, with malunion, subsequent encounter 02/11/2019   Charcot ankle, right    Pain  from implanted hardware    SOB (shortness of breath)    Acute on chronic systolic (congestive) heart failure ([name])    Benign hypertensive heart and kidney disease with diastolic CHF, NYHA class II and CKD stage III ([place])    Diabetes mellitus type 2 in obese [facility])    Supplemental oxygen dependent    Trimalleolar fracture of ankle, closed, right, sequela 12/01/2018   Hypoxia    Acute on chronic anemia    Closed dislocation of right talus    Acute on chronic postoperative respiratory failure ([name])    Postprocedural hypotension    Trimalleolar fracture of ankle,  closed, right, initial encounter    Syndesmotic disruption of ankle, right, initial encounter    Ankle fracture 11/22/2018   Pain in joint, ankle and foot 04/10/2018   Closed left fibular fracture 04/10/2018   CKD (chronic kidney disease), stage IV ([place]) 04/09/2018   Near syncope 04/09/2018   Dyspnea    Bronchitis, acute 03/11/2018   Fatigue 03/11/2018   Anemia due to stage 4 chronic kidney disease ([place]) 03/11/2018   Pulmonary fibrosis ([place]) 03/11/2018   Pulmonary HTN ([place]) 03/11/2018   Anemia 03/11/2018   Lisfranc dislocation, left, subsequent encounter 06/08/2017   Chronic respiratory failure with hypoxia ([place]) 06/03/2017   Falls 06/03/2017   Hypokalemia 06/03/2017   Sepsis secondary to UTI ([place]) 06/02/2017   Dog bite 05/02/2017   CHF exacerbation ([place]) 41/32/4401   Acute diastolic CHF (congestive heart failure) ([place]) 05/02/2017   Chronic diastolic CHF (congestive heart failure) ([place]) 04/02/2017   Cor pulmonale ([place]) 04/02/2017   Acute on chronic respiratory failure ([place]) 03/10/2017   Chronic pain 03/10/2017   Lactic acidosis 02/10/2017   Acute respiratory failure with hypoxia ([place]) 02/09/2017   Acute on chronic diastolic CHF (congestive heart failure) ([place]) 06/23/2016   HTN (hypertension) 06/23/2016   Type 2 diabetes mellitus with complication, with long-term current use of insulin ([place]) 03/11/2016   Depression 03/11/2016   Chronic obstructive pulmonary disease ([place]) 03/10/2016   S/P lumbar spinal fusion 02/22/2016   Coronary artery disease involving native coronary artery 07/12/2014   Pain in the chest    Malignant neoplasm of lower lobe of right lung ([place]) 05/25/2014   Lung cancer ([place]) 09/01/2012    Past Surgical History:  Procedure Laterality Date   ANKLE FUSION Right 02/11/2019   Procedure: RIGHT ANKLE FUSION;  Surgeon: Newt Minion, MD;  Location: [place];  Service: Orthopedics;  Laterality: Right;   APPLICATION OF WOUND VAC  Right 11/23/2018   Procedure: Application Of Wound Vac;  Surgeon: Newt Minion, MD;  Location: [place];  Service: Orthopedics;  Laterality: Right;   CARDIAC CATHETERIZATION     1995 or 11  [facility] [place]     bilateral cataracts   HARDWARE REMOVAL Right 02/11/2019   Procedure: REMOVAL DEEP HARDWARE RIGHT ANKLE;  Surgeon: Newt Minion, MD;  Location: [place];  Service: Orthopedics;  Laterality: Right;   LEFT HEART CATHETERIZATION WITH CORONARY ANGIOGRAM N/A 07/12/2014   Procedure: LEFT HEART CATHETERIZATION WITH CORONARY ANGIOGRAM;  Surgeon: [name], MD;  [facility] LAB;  Service: Cardiovascular;  Laterality: N/A;   MAXIMUM ACCESS (MAS)POSTERIOR LUMBAR INTERBODY FUSION (PLIF) 2 LEVEL N/A 02/22/2016   Procedure: Lumbar three-four - Lumbar four-five  MAXIMUM ACCESS SURGERY  POSTERIOR LUMBAR INTERBODY FUSION;  Surgeon: [name], MD;  Location: [facility] NEURO ORS;  Service: Neurosurgery;  Laterality: N/A;   ORIF  ANKLE FRACTURE Right 11/23/2018   Procedure: OPEN REDUCTION INTERNAL FIXATION (ORIF) RIGHT ANKLE FRACTURE;  Surgeon: Newt Minion, MD;  Location: [facility];  Service: Orthopedics;  Laterality: Right;   ORIF TOE FRACTURE Left 06/12/2017   Procedure: OPEN REDUCTION INTERNAL FIXATION (ORIF) BASE 1ST METATARSAL (TOE) FRACTURE;  Surgeon: Newt Minion, MD;  Location: [place];  Service: Orthopedics;  Laterality: Left;   RIGHT HEART CATH N/A 08/06/2017   Procedure: RIGHT HEART CATH;  Surgeon: Larey Dresser, MD;  Location: [place] CV LAB;  Service: Cardiovascular;  Laterality: N/A;   RIGHT HEART CATH N/A 03/03/2019   Procedure: RIGHT HEART CATH;  Surgeon: Jolaine Artist, MD;  Location: [facility] CV LAB;  Service: Cardiovascular;  Laterality: N/A;   THORACOTOMY Right 2010   lower   TONSILLECTOMY       OB History   No obstetric history on file.      Home Medications    Prior to Admission medications   Medication Sig Start Date End  Date Taking? Authorizing Provider  acetaminophen (TYLENOL) 325 MG tablet Take 650 mg by mouth every 6 (six) hours as needed for moderate pain or fever.     [provider]  albuterol (PROVENTIL) (2.5 MG/3ML) 0.083% nebulizer solution Take 3 mLs (2.5 mg total) by nebulization every 4 (four) hours as needed for wheezing or shortness of breath. 03/11/19   Rayburn, Neta Mends, PA-C  atorvastatin (LIPITOR) 40 MG tablet Take 1 tablet (40 mg total) by mouth daily. 04/20/18 01/11/20  Larey Dresser, MD  azelastine (ASTELIN) 0.1 % nasal spray Place 1 spray into both nostrils 2 (two) times daily. Use in each nostril as directed 12/28/18   Martyn Ehrich, NP  budesonide (PULMICORT) 0.5 MG/2ML nebulizer solution Take 2 mLs (0.5 mg total) by nebulization 2 (two) times daily. 03/11/19 04/11/19  Rayburn, Neta Mends, PA-C  citalopram (CELEXA) 10 MG tablet TAKE 1 TABLET BY MOUTH EVERY DAY Patient not taking: Reported on 02/10/2019 01/31/19   Charlett Blake, MD  docusate sodium (COLACE) 100 MG capsule Take 1 capsule (100 mg total) by mouth 2 (two) times daily. Patient not taking: Reported on 02/10/2019 12/14/18   Love, Ivan Anchors, PA-C  doxycycline (VIBRAMYCIN) 100 MG capsule Take 1 capsule (100 mg total) by mouth 2 (two) times daily. 01/24/19   Rayburn, Neta Mends, PA-C  formoterol (PERFOROMIST) 20 MCG/2ML nebulizer solution Take 2 mLs (20 mcg total) by nebulization 2 (two) times daily. 03/11/19 04/11/19  Rayburn, Neta Mends, PA-C  furosemide (LASIX) 40 MG tablet Take 1.5 tablets (60 mg total) by mouth 2 (two) times daily. Patient taking differently: Take 40 mg by mouth 2 (two) times daily.  01/11/19   Larey Dresser, MD  gabapentin (NEURONTIN) 300 MG capsule Take 1 capsule (300 mg total) by mouth at bedtime. Patient taking differently: Take 300 mg by mouth 3 (three) times daily.  11/30/18   Domenic Polite, MD  guaiFENesin (MUCINEX) 600 MG 12 hr tablet Take 1 tablet (600 mg total) by  mouth 2 (two) times daily. 03/11/19 04/11/19  Rayburn, Neta Mends, PA-C  insulin detemir (LEVEMIR) 100 UNIT/ML injection Inject 0.15 mLs (15 Units total) into the skin 2 (two) times daily. 03/11/19   Rayburn, Neta Mends, PA-C  insulin lispro (HUMALOG) 100 UNIT/ML injection Inject 2-11 Units into the skin 3 (three) times daily before meals.     [provider]  ipratropium (ATROVENT) 0.02 % nebulizer solution Take 2.5 mLs (0.5 mg total) by  nebulization 3 (three) times daily. 03/11/19   Rayburn, Neta Mends, PA-C  iron polysaccharides (NIFEREX) 150 MG capsule Take 1 capsule (150 mg total) by mouth 2 (two) times daily. 12/14/18   Love, Ivan Anchors, PA-C  levalbuterol (XOPENEX) 0.63 MG/3ML nebulizer solution Take 3 mLs (0.63 mg total) by nebulization 3 (three) times daily. 03/11/19   Rayburn, Neta Mends, PA-C  macitentan (OPSUMIT) 10 MG tablet Take 1 tablet (10 mg total) by mouth daily. 08/25/18   Larey Dresser, MD  midodrine (PROAMATINE) 5 MG tablet Take 1 tablet (5 mg total) by mouth 3 (three) times daily with meals. 12/14/18   Love, Ivan Anchors, PA-C  mometasone-formoterol (DULERA) 200-5 MCG/ACT AERO Inhale 2 puffs into the lungs 2 (two) times daily. 12/14/18   Love, Ivan Anchors, PA-C  OXYGEN Inhale 3 L into the lungs continuous. FOR COPD    [provider]  polyethylene glycol (MIRALAX / GLYCOLAX) 17 g packet Take 17 g by mouth daily as needed for mild constipation. Patient not taking: Reported on 02/10/2019 11/30/18   Domenic Polite, MD  Selexipag (UPTRAVI) 600 MCG TABS Take 600 mcg by mouth 2 (two) times daily. 03/03/19   Larey Dresser, MD  sertraline (ZOLOFT) 50 MG tablet Take 75 mg by mouth daily.     [provider]  sodium chloride (OCEAN) 0.65 % SOLN nasal spray Place 1 spray into both nostrils as needed for congestion. 12/14/18   Love, Ivan Anchors, PA-C  traMADol (ULTRAM) 50 MG tablet Take 1 tablet (50 mg total) by mouth every 6 (six) hours as needed for moderate  pain. 03/11/19   Rayburn, Neta Mends, PA-C  TRULICITY 1.5 SW/1.0XN SOPN Inject 1.5 mg into the vein once a week.  11/08/18   [provider]  umeclidinium bromide (INCRUSE ELLIPTA) 62.5 MCG/INH AEPB Inhale 1 puff into the lungs daily. 03/11/19 04/11/19  Rayburn, Neta Mends, PA-C    Family History Family History  Problem Relation Age of Onset   Diabetes Father    Heart failure Father    Cancer Sister    Diabetes Brother    Heart failure Brother     Social History Social History   Tobacco Use   Smoking status: Former Smoker    Packs/day: 1.00    Years: 30.00    Pack years: 30.00    Quit date: 07/14/2006    Years since quitting: 12.6   Smokeless tobacco: Never Used  Substance Use Topics   Alcohol use: No   Drug use: No     Allergies   Amoxicillin   Review of Systems Review of Systems  Constitutional: Positive for fatigue. Negative for activity change, appetite change, chills, diaphoresis and fever.  Respiratory: Negative.   Cardiovascular: Negative.   Gastrointestinal: Negative.   Genitourinary: Negative.   Musculoskeletal: Positive for gait problem.  Skin: Positive for wound.  Neurological: Positive for dizziness, weakness, light-headedness and headaches. Negative for tremors (Generalized), seizures, syncope, speech difficulty and numbness.  All other systems reviewed and are negative.    Physical Exam Updated Vital Signs BP 120/67    Pulse 79    Temp 97.8 F (36.6 C) (Oral)    Resp 17    Ht _0  (1.549 m)    Wt 72.6 kg    SpO2 98%    BMI 30.23 kg/m   Physical Exam Vitals signs and nursing note reviewed.  Constitutional:      General: She is not in acute distress.    Appearance: She  is well-developed. She is ill-appearing (Chronically ill-appearing).     Comments: On 5 L oxygen nasal cannula at baseline.  HENT:     Head: Normocephalic and atraumatic.     Nose: Nose normal.     Mouth/Throat:     Mouth: Mucous membranes are moist.      Pharynx: Oropharynx is clear.  Eyes:     Pupils: Pupils are equal, round, and reactive to light.  Neck:     Musculoskeletal: Normal range of motion.  Cardiovascular:     Rate and Rhythm: Normal rate.     Pulses: Normal pulses.          Radial pulses are 2+ on the right side and 2+ on the left side.       Dorsalis pedis pulses are 2+ on the right side and 2+ on the left side.       Posterior tibial pulses are 2+ on the right side and 2+ on the left side.     Heart sounds: Normal heart sounds.  Pulmonary:     Effort: Pulmonary effort is normal. No respiratory distress.     Breath sounds: Normal breath sounds.     Comments: Clear to auscultation bilaterally.  Speaks in full sentences without difficulty Abdominal:     General: There is no distension.     Comments: Soft, Nontender without rebound or guarding  Musculoskeletal: Normal range of motion.     Comments: Moves all 4 extremities without difficulty.    Skin:    General: Skin is warm and dry.     Comments: There is mild swelling, redness and warmth to her right lower extremity.  She does have mild purulent drainage from her surgical site on the anterior surface of her right lower extremity.  She has brisk capillary refill.   Neurological:     Mental Status: She is alert.        ED Treatments / Results  Labs (all labs ordered are listed, but only abnormal results are displayed) Labs Reviewed  CBC WITH DIFFERENTIAL/PLATELET - Abnormal; Notable for the following components:      Result Value   RBC 3.60 (*)    Hemoglobin 9.6 (*)    HCT 31.4 (*)    RDW 18.4 (*)    All other components within normal limits  COMPREHENSIVE METABOLIC PANEL - Abnormal; Notable for the following components:   Potassium 3.1 (*)    Glucose, Bld 132 (*)    BUN 32 (*)    Creatinine, Ser 1.35 (*)    Calcium 8.8 (*)    Total Protein 6.1 (*)    Albumin 3.2 (*)    GFR calc non Af Amer 39 (*)    GFR calc Af Amer 45 (*)    All other components  within normal limits  TROPONIN I (HIGH SENSITIVITY) - Abnormal; Notable for the following components:   Troponin I (High Sensitivity) 22 (*)    All other components within normal limits  URINE CULTURE  SARS CORONAVIRUS 2 (HOSPITAL ORDER, [place] [facility])  URINALYSIS, ROUTINE W REFLEX MICROSCOPIC  LACTIC ACID, PLASMA  LACTIC ACID, PLASMA  TROPONIN I (HIGH SENSITIVITY)    EKG EKG Interpretation  Date/Time:  Tuesday March 22 2019 14:50:46 EDT Ventricular Rate:  80 PR Interval:    QRS Duration: 128 QT Interval:  470 QTC Calculation: 543 R Axis:   -100 Text Interpretation:  Sinus rhythm Ventricular premature complex RBBB and LAFB flipped t waves in  inferior and lateral leads resolved Otherwise no significant change Confirmed by Deno Etienne [phone]) on 03/22/2019 3:19:28 PM   Radiology Dg Chest 2 View  Result Date: 03/22/2019 CLINICAL DATA:  Dizziness EXAM: CHEST - 2 VIEW COMPARISON:  03/02/2019 FINDINGS: Stable cardiomediastinal contours. Aorta is calcified. No focal airspace consolidation, pleural effusion, or pneumothorax. IMPRESSION: No active cardiopulmonary disease. Electronically Signed   By: Davina Poke M.D.   On: 03/22/2019 16:14   Dg Ankle Complete Right  Result Date: 03/22/2019 CLINICAL DATA:  Recent right ankle ORIF. EXAM: RIGHT ANKLE - COMPLETE 3+ VIEW COMPARISON:  Right ankle x-rays dated February 07, 2019. FINDINGS: New subacute transverse fracture of the lateral malleolus with slight lateral displacement. Progressive callus formation surrounding the subacute distal fibular diaphyseal fracture. Subacute, healing nondisplaced fracture of the medial malleolus with increased surrounding callus formation. Subacute, mildly displaced fracture of the posterior malleolus with increased callus formation. Mild lateral subluxation of the talus with respect to the tibial plafond. No dislocation. Interval removal of the fibular plate and screw construct, as well as  the medial malleolar screw. Interval tibiotalar joint fusion with anterior plate and screw construct. No definite bridging bone. There is loosening and slight backing out of both talar head screws. Unchanged midfoot osteoarthritis. Osteopenia. Diffuse soft tissue swelling about the ankle. IMPRESSION: 1. New subacute transverse fracture of the lateral malleolus. 2. Healing medial and posterior malleolar fractures. Healing distal fibular diaphyseal fracture. 3. Interval tibiotalar joint fusion with anterior plate and screw construct. Loosening of both talar head screws. Electronically Signed   By: Titus Dubin M.D.   On: 03/22/2019 16:22   Ct Head Wo Contrast  Result Date: 03/22/2019 CLINICAL DATA:  Vertigo episodic peripheral EXAM: CT HEAD WITHOUT CONTRAST TECHNIQUE: Contiguous axial images were obtained from the base of the skull through the vertex without intravenous contrast. COMPARISON:  CT head 03/11/2018 FINDINGS: Brain: Mild atrophy. Patchy white matter hypodensity bilaterally appears stable from the prior study. No acute infarct, hemorrhage, mass. Vascular: Negative for hyperdense vessel Skull: Negative Sinuses/Orbits: Paranasal sinuses clear. Right mastoid effusion. Bilateral cataract surgery. Other: None IMPRESSION: No acute abnormality no change from the prior study. Stable atrophy and chronic microvascular ischemic change in the white matter. Electronically Signed   By: Franchot Gallo M.D.   On: 03/22/2019 16:20   Procedures Procedures (including critical care time)  Medications Ordered in ED Medications  clindamycin (CLEOCIN) capsule 300 mg (has no administration in time range)  sodium chloride 0.9 % bolus 500 mL (0 mLs Intravenous Stopped 03/22/19 1944)  sodium chloride 0.9 % bolus 500 mL (500 mLs Intravenous New Bag/Given 03/22/19 1844)   Initial Impression / Assessment and Plan / ED Course  I have reviewed the triage vital signs and the nursing notes.  Pertinent labs & imaging results  that were available during my care of the patient were reviewed by me and considered in my medical decision making (see chart for details).  73 year old female appears chronically ill however nontoxic appearing presents for evaluation multiple complaints.  Afebrile without tachycardia, tachypnea or hypoxia.  She has a nonfocal neurologic exam without deficits.  Has had intermittent dizziness and was found to be hypotensive with systolic in the 28Z by wound care nurse.  She has a wound who is currently being followed by Dr. Sharol Given.  No evidence of sepsis on labs.  There is some mild surrounding erythema and warmth without fluctuance or induration.  Heart and lungs clear.  She is on her 5 L oxygen  nasal cannula from home.  With mild hypotension here systolic into the 283T.  Unfortunately she is not to great with explaining her dizziness as if it is vertiginous in nature.  CT head negative.  I have low suspicion for CVA or other central cause of her dizziness.  No evidence of DVT on exam.  I have low suspicion for ACS, PE, dissection as cause of dizziness.  1910: Patient with orthostatic vital signs even after 500 cc fluid.  Will put in for additional 500 cc.  States she did get slightly dizzy when she went to stand.  Have a mildly elevated troponin department however has history of heart failure.  She denies any chest pain or shortness of breath.  EKG without ischemic changes.  We will plan to trend this.  I feel given her heart failure would likely benefit from slow fluid replacement.  Will consult with hospitalist for admission. Given erythema around surgical would will give Clindamycin abx for possible early infectious process.  No abscess to drain.  Plain film does not show evidence of osteomyelitis however does have a subacute fracture.  Low suspicion for infected hardware as cause of her hypotension and dizziness.  Her hemoglobin is at her baseline I have low suspicion for symptomatic anemia.  Urinalysis is  negative for infection.  Does have some mild hypokalemia at 3.1 will give p.o. replacement.  Blood pressure 90/60 in room on reevaluation.  No evidence of fluid overload, infectious process on her chest x-ray.    Clinical Course as of Mar 22 1947  Tue Mar 22, 2019  1921 No Leukocytosis, hemoglobin 9.6 at baseline  CBC with Differential(!) [BH]  1921 Hyperglycemia at 132, creatinine 1.35, GFR 39 at baseline  Comprehensive metabolic panel(!) [BH]  5176 Elevated, denies CP, SOB  Troponin I (High Sensitivity)(!) [BH]  1926 Similar to previous, No STEMI  EKG 12-Lead [BH]  1927 Negative for CVA, mass  CT Head Wo Contrast [BH]  1927 No pulmonary edema, infiltrates, cardiomegaly, pneumothorax  DG Chest 2 View [BH]  1927 Subacute fracture transverse malleolus.  Interval hardware removal.  Lossening of current hardware.  DG Ankle Complete Right [BH]  1928 Negative for UTI  Urinalysis, Routine w reflex microscopic [BH]  1928 pending  SARS Coronavirus 2 [facility] order, Performed in [facility] hospital lab) Nasopharyngeal Nasopharyngeal Swab [BH]    Clinical Course User Index [BH] Sequoyah Counterman A, PA-C    1925: Consulted with Dr. Maudie Mercury with [facility] who will evaluate patient for admission.  Small additional bolus of fluids ordered.  The patient appears reasonably stabilized for admission considering the current resources, flow, and capabilities available in the ED at this time, and I doubt any other [facility] requiring further screening and/or treatment in the ED prior to admission.     Patient has been seen and evaluated by attending physician, Dr. Tyrone Nine who agrees with above treatment, plan disposition. Final Clinical Impressions(s) / ED Diagnoses   Final diagnoses:  Hypotension, unspecified hypotension type  Dizziness  Cellulitis of right lower extremity  Hypokalemia    ED Discharge Orders    None       Zailynn Brandel A, PA-C 03/22/19 1948    Kodie Pick A, PA-C 03/22/19  [place], Dan, DO 03/22/19 1959

## 2019-03-22 NOTE — H&P (Signed)
TRH H&P    Patient Demographics:    Gianina Olinde, is a 73 y.o. female  MRN: [id number]  DOB - June 21, 1946  Admit Date - 03/22/2019  Referring MD/NP/PA:  Morton Amy  Outpatient Primary MD for the patient is Maurice Small, MD [place] - cardiology [place] [facility] - oncology  Patient coming from:  home  Chief complaint-  dizziness   HPI:    Shammara Jarrett  is a 73 y.o. 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  Home health nurse came to her house today and checked her bp and recommended go to ER for orthostatic / low bp.    In ED,  T 97.8  P 82 R 18,  Bp 107/45,  Pox 97% on [place]    Wt 72.6 kg (03/22/2019) Wt 76.3 kg (03/11/2019)  CT brain IMPRESSION: No acute abnormality no change from the prior study.  Stable atrophy and chronic microvascular ischemic change in the white matter.  CXR IMPRESSION: No active cardiopulmonary disease.  Xray R ankle IMPRESSION: 1. New subacute transverse fracture of the lateral malleolus. 2. Healing medial and posterior malleolar fractures. Healing distal fibular diaphyseal fracture. 3. Interval tibiotalar joint fusion with anterior plate and screw construct. Loosening of both talar head screws.  Na 141, k 3.1 Glucose 132, Bun  32 Creatinine 1.35 Alb 3.2 Ast 19, Alt 14 Alk phos 104, T. Bili 0.8 Wbc 5.8, hgb 9.6, Plt 290 Lactic acid 0.6,   Urinalysis negative,   EKG: nsr at 80, RAD, RBBB , t wave inversion in the lateral lead v4-6 now flat Trop 22  covid -19 pending  I spoke with Dr. Louanne Skye, and [facility] will consult on the patient for new subacute fracture of the lateral malleolus  Pt will be admitted for orthostatic hypotension, elevated troponin , and also new subacute transverse fracture of the lateral malleolus since 02/11/2019 xray.     Review of systems:    In addition to the HPI above,  Pt has had slight redness of the right proximal dorsum of foot near ankle level Not sure how long this has been going on   No Fever-chills, No Headache, No changes with Vision or hearing, No problems swallowing food or Liquids, No Chest pain, Cough or Shortness of Breath, No Abdominal pain, No Nausea or Vomiting, bowel movements are regular, No Blood in stool or Urine, No dysuria,  No new joints pains-aches,  No new weakness, tingling, numbness in  any extremity, No recent weight gain or loss, No polyuria, polydypsia or polyphagia, No significant Mental Stressors.  All other systems reviewed and are negative.    Past History of the following :    Past Medical History:  Diagnosis Date  . Anemia   . Anxiety   . Arthritis   . CAD (coronary artery disease)    a. Prior cath 2015 showed 40% prox AD, 50-50% mLAD, otherwise calcification but no obstruction in LCx/RCA.. Medical management recommended. b. 2017: low-risk NST.  [name] Chronic diastolic CHF (congestive heart failure) ([place])   . Chronic respiratory failure ([place])   . CKD (chronic kidney disease), stage III ([place])   . Complication of anesthesia    oxygen level dropped in last surgery 11/2018  . COPD (chronic obstructive pulmonary disease) ([place])   . Cor pulmonale ([name])    a. felt due to advanced COPD and noncompliance with O2.  . Depression   .  Diabetes mellitus    [name]    dx  2008  . Fracture    left foot  . Hx of seasonal allergies   . Hypercholesteremia   . Hypertension   . Lung cancer ([place])   . On home oxygen therapy    "2.5L; 24/7" (03/10/2017)  . Pericardial effusion    a. small-moderate in 07/2016.  [name] Pneumonia   . Pulmonary hypertension ([place])   . UTI (urinary tract infection)       Past Surgical History:  Procedure Laterality Date  . ANKLE FUSION Right 02/11/2019   Procedure: RIGHT ANKLE FUSION;  Surgeon: Newt Minion, MD;  Location: [facility];  Service: Orthopedics;  Laterality: Right;  . APPLICATION OF WOUND VAC Right 11/23/2018   Procedure: Application Of Wound Vac;  Surgeon: Newt Minion, MD;  Location: [facility];  Service: Orthopedics;  Laterality: Right;  . [facility] in [place]     bilateral cataracts  . HARDWARE REMOVAL Right 02/11/2019   Procedure: REMOVAL DEEP HARDWARE RIGHT ANKLE;  Surgeon: Newt Minion, MD;  Location: [facility];  Service: Orthopedics;  Laterality: Right;  . LEFT HEART CATHETERIZATION WITH CORONARY ANGIOGRAM N/A 07/12/2014   Procedure: LEFT HEART CATHETERIZATION WITH CORONARY ANGIOGRAM;  Surgeon: [name], MD;  Location[facility] C[facility];  Service: Cardiovascular;  Laterality: N/A;  . MAXIMUM ACCESS (MAS)POSTERIOR LUMBAR INTERBODY FUSION (PLIF) 2 LEVEL N/A 02/22/2016   Procedure: Lumbar three-four - Lumbar four-five  MAXIMUM ACCESS SURGERY  POSTERIOR LUMBAR INTERBODY FUSION;  Surgeon: [name], MD;  Location: [facility]O [facility];  Service: Neurosurgery;  Laterality: N/A;  . ORIF ANKLE FRACTURE Right 11/23/2018   Procedure: OPEN REDUCTION INTERNAL FIXATION (ORIF) RIGHT ANKLE FRACTURE;  Surgeon: Newt Minion, MD;  Location: [facility];  Service: Orthopedics;  Laterality: Right;  . ORIF TOE FRACTURE Left 06/12/2017   Procedure: OPEN REDUCTION INTERNAL FIXATION (ORIF) BASE 1ST METATARSAL (TOE) FRACTURE;  Surgeon: Newt Minion, MD;   Location: [facility];  Service: Orthopedics;  Laterality: Left;  . RIGHT HEART CATH N/A 08/06/2017   Procedure: RIGHT HEART CATH;  Surgeon: Larey Dresser, MD;  Location: [facility] CV LAB;  Service: Cardiovascular;  Laterality: N/A;  . RIGHT HEART CATH N/A 03/03/2019   Procedure: RIGHT HEART CATH;  Surgeon: Jolaine Artist, MD;  Location: [facility] CV LAB;  Service: Cardiovascular;  Laterality: N/A;  . THORACOTOMY Right 2010   lower  .  TONSILLECTOMY        Social History:      Social History   Tobacco Use  . Smoking status: Former Smoker    Packs/day: 1.00    Years: 30.00    Pack years: 30.00    Quit date: 07/14/2006    Years since quitting: 12.6  . Smokeless tobacco: Never Used  Substance Use Topics  . Alcohol use: No       Family History :     Family History  Problem Relation Age of Onset  . Diabetes Father   . Heart failure Father   . Cancer Sister   . Diabetes Brother   . Heart failure Brother        Home Medications:   Prior to Admission medications   Medication Sig Start Date End Date Taking? Authorizing Provider  gabapentin (NEURONTIN) 300 MG capsule Take 1 capsule (300 mg total) by mouth at bedtime. 11/30/18  Yes Domenic Polite, MD  insulin detemir (LEVEMIR) 100 UNIT/ML injection Inject 0.15 mLs (15 Units total) into the skin 2 (two) times daily. Patient taking differently: Inject 20 Units into the skin at bedtime.  03/11/19  Yes Rayburn, Neta Mends, PA-C  insulin lispro (HUMALOG) 100 UNIT/ML injection Inject 2-11 Units into the skin See admin instructions. Inject 2-11 units into the skin three times a day before meals and at bedtime, per sliding scale   Yes [provider]  OXYGEN Inhale 4-5 L/min into the lungs continuous.    Yes [provider]  Selexipag (UPTRAVI) 600 MCG TABS Take 600 mcg by mouth 2 (two) times daily. 03/03/19  Yes Larey Dresser, MD  acetaminophen (TYLENOL) 325 MG tablet Take 650 mg by mouth every 6 (six)  hours as needed for moderate pain or fever.     [provider]  albuterol (PROVENTIL) (2.5 MG/3ML) 0.083% nebulizer solution Take 3 mLs (2.5 mg total) by nebulization every 4 (four) hours as needed for wheezing or shortness of breath. 03/11/19   Rayburn, Neta Mends, PA-C  atorvastatin (LIPITOR) 40 MG tablet Take 1 tablet (40 mg total) by mouth daily. 04/20/18 01/11/20  Larey Dresser, MD  azelastine (ASTELIN) 0.1 % nasal spray Place 1 spray into both nostrils 2 (two) times daily. Use in each nostril as directed 12/28/18   Martyn Ehrich, NP  budesonide (PULMICORT) 0.5 MG/2ML nebulizer solution Take 2 mLs (0.5 mg total) by nebulization 2 (two) times daily. 03/11/19 04/11/19  Rayburn, Neta Mends, PA-C  citalopram (CELEXA) 10 MG tablet TAKE 1 TABLET BY MOUTH EVERY DAY Patient not taking: Reported on 02/10/2019 01/31/19   Charlett Blake, MD  docusate sodium (COLACE) 100 MG capsule Take 1 capsule (100 mg total) by mouth 2 (two) times daily. Patient not taking: Reported on 02/10/2019 12/14/18   Love, Ivan Anchors, PA-C  doxycycline (VIBRAMYCIN) 100 MG capsule Take 1 capsule (100 mg total) by mouth 2 (two) times daily. 01/24/19   Rayburn, Neta Mends, PA-C  formoterol (PERFOROMIST) 20 MCG/2ML nebulizer solution Take 2 mLs (20 mcg total) by nebulization 2 (two) times daily. 03/11/19 04/11/19  Rayburn, Neta Mends, PA-C  furosemide (LASIX) 40 MG tablet Take 1.5 tablets (60 mg total) by mouth 2 (two) times daily. Patient taking differently: Take 40 mg by mouth 2 (two) times daily.  01/11/19   Larey Dresser, MD  guaiFENesin (MUCINEX) 600 MG 12 hr tablet Take 1 tablet (600 mg total) by mouth 2 (two) times daily. 03/11/19 04/11/19  Rayburn, Neta Mends, PA-C  ipratropium (ATROVENT) 0.02 % nebulizer solution Take 2.5 mLs (0.5 mg total) by nebulization 3 (three) times daily. 03/11/19   Rayburn, Neta Mends, PA-C  iron polysaccharides (NIFEREX) 150 MG capsule Take 1 capsule (150 mg  total) by mouth 2 (two) times daily. 12/14/18   Love, Ivan Anchors, PA-C  levalbuterol (XOPENEX) 0.63 MG/3ML nebulizer solution Take 3 mLs (0.63 mg total) by nebulization 3 (three) times daily. 03/11/19   Rayburn, Neta Mends, PA-C  macitentan (OPSUMIT) 10 MG tablet Take 1 tablet (10 mg total) by mouth daily. 08/25/18   Larey Dresser, MD  midodrine (PROAMATINE) 5 MG tablet Take 1 tablet (5 mg total) by mouth 3 (three) times daily with meals. 12/14/18   Love, Ivan Anchors, PA-C  mometasone-formoterol (DULERA) 200-5 MCG/ACT AERO Inhale 2 puffs into the lungs 2 (two) times daily. 12/14/18   Love, Ivan Anchors, PA-C  polyethylene glycol (MIRALAX / GLYCOLAX) 17 g packet Take 17 g by mouth daily as needed for mild constipation. Patient not taking: Reported on 02/10/2019 11/30/18   Domenic Polite, MD  sertraline (ZOLOFT) 50 MG tablet Take 75 mg by mouth daily.     [provider]  sodium chloride (OCEAN) 0.65 % SOLN nasal spray Place 1 spray into both nostrils as needed for congestion. 12/14/18   Love, Ivan Anchors, PA-C  traMADol (ULTRAM) 50 MG tablet Take 1 tablet (50 mg total) by mouth every 6 (six) hours as needed for moderate pain. 03/11/19   Rayburn, Neta Mends, PA-C  TRULICITY 1.5 XB/2.8UX SOPN Inject 1.5 mg into the vein once a week.  11/08/18   [provider]  umeclidinium bromide (INCRUSE ELLIPTA) 62.5 MCG/INH AEPB Inhale 1 puff into the lungs daily. 03/11/19 04/11/19  Rayburn, Neta Mends, PA-C     Allergies:     Allergies  Allergen Reactions  . Amoxicillin Anaphylaxis, Hives, Rash and Other (See Comments)    Has patient had a PCN reaction causing immediate rash, facial/tongue/throat swelling, SOB or lightheadedness with hypotension: YES Positive reaction causing SEVERE RASH INVOLVING MUCUS MEMBRANES/SKIN NECROSIS: YES Reaction that required HOSPITALIZATION: YES Reaction occurring within the last 10 years: NO     Physical Exam:   Vitals  Blood pressure 120/67, pulse 79,  temperature 97.8 F (36.6 C), temperature source Oral, resp. rate 17, height _0  (1.549 m), weight 72.6 kg, SpO2 98 %.  1.  General: axoxo3  2. Psychiatric: euthymic  3. Neurologic: nonfocal  4. HEENMT:  Anicteric, pupils 1.69m symmetric, direct, consensual,  Neck: no jvd  5. Respiratory : Prolonged exp phase, no wheezing, no crackles.   6. Cardiovascular : rrr s1, s2, 2/ 6 sem rusb  7. Gastrointestinal:  Abd: soft, nt, nd, +bs  8. Skin:  Ext: no c/c/e,  Slight redness at the R ankle/ foot junction, dorsal aspect, slight warmth, about 6cm area.   9.Musculoskeletal:  Good ROM    Data Review:    CBC Recent Labs  Lab 03/22/19 1654  WBC 5.8  HGB 9.6*  HCT 31.4*  PLT 290  MCV 87.2  MCH 26.7  MCHC 30.6  RDW 18.4*  LYMPHSABS 0.7  MONOABS 0.5  EOSABS 0.2  BASOSABS 0.0   ------------------------------------------------------------------------------------------------------------------  Results for orders placed or performed during the hospital encounter of 03/22/19 (from the past 48 hour(s))  CBC with Differential     Status: Abnormal   Collection Time: 03/22/19  4:54 PM  Result Value Ref Range   WBC 5.8 4.0 - 10.5 K/uL   RBC 3.60 (L)  3.87 - 5.11 MIL/uL   Hemoglobin 9.6 (L) 12.0 - 15.0 g/dL   HCT 31.4 (L) 36.0 - 46.0 %   MCV 87.2 80.0 - 100.0 fL   MCH 26.7 26.0 - 34.0 pg   MCHC 30.6 30.0 - 36.0 g/dL   RDW 18.4 (H) 11.5 - 15.5 %   Platelets 290 150 - 400 K/uL   nRBC 0.0 0.0 - 0.2 %   Neutrophils Relative % 75 %   Neutro Abs 4.3 1.7 - 7.7 K/uL   Lymphocytes Relative 12 %   Lymphs Abs 0.7 0.7 - 4.0 K/uL   Monocytes Relative 9 %   Monocytes Absolute 0.5 0.1 - 1.0 K/uL   Eosinophils Relative 4 %   Eosinophils Absolute 0.2 0.0 - 0.5 K/uL   Basophils Relative 0 %   Basophils Absolute 0.0 0.0 - 0.1 K/uL   Immature Granulocytes 0 %   Abs Immature Granulocytes 0.02 0.00 - 0.07 K/uL    Comment: Performed at [facility] [address],  [place], [place] [address]  Comprehensive metabolic panel     Status: Abnormal   Collection Time: 03/22/19  4:54 PM  Result Value Ref Range   Sodium 141 135 - 145 mmol/L   Potassium 3.1 (L) 3.5 - 5.1 mmol/L   Chloride 102 98 - 111 mmol/L   CO2 28 22 - 32 mmol/L   Glucose, Bld 132 (H) 70 - 99 mg/dL   BUN 32 (H) 8 - 23 mg/dL   Creatinine, Ser 1.35 (H) 0.44 - 1.00 mg/dL   Calcium 8.8 (L) 8.9 - 10.3 mg/dL   Total Protein 6.1 (L) 6.5 - 8.1 g/dL   Albumin 3.2 (L) 3.5 - 5.0 g/dL   AST 19 15 - 41 U/L   ALT 14 0 - 44 U/L   Alkaline Phosphatase 104 38 - 126 U/L   Total Bilirubin 0.8 0.3 - 1.2 mg/dL   GFR calc non Af Amer 39 (L) >60 mL/min   GFR calc Af Amer 45 (L) >60 mL/min   Anion gap 11 5 - 15    Comment: Performed at [facility], [place] [address], [place], [place] [address]  Troponin I (High Sensitivity)     Status: Abnormal   Collection Time: 03/22/19  4:54 PM  Result Value Ref Range   Troponin I (High Sensitivity) 22 (H) <18 ng/L    Comment: (NOTE) Elevated high sensitivity troponin I (hsTnI) values and significant  changes across serial measurements may suggest ACS but many other  chronic and acute conditions are known to elevate hsTnI results.  Refer to the "Links" section for chest pain algorithms and additional  guidance. Performed at [facility], [place] [address] [place], [place] [address]   Lactic acid, plasma     Status: None   Collection Time: 03/22/19  4:54 PM  Result Value Ref Range   Lactic Acid, Venous 0.6 0.5 - 1.9 mmol/L    Comment: Performed at [facility] [address], [place], [place] [address]  Urinalysis, Routine w reflex microscopic     Status: None   Collection Time: 03/22/19  6:11 PM  Result Value Ref Range   Color, Urine YELLOW YELLOW   APPearance CLEAR CLEAR   Specific Gravity, Urine 1.010 1.005 - 1.030   pH 6.0 5.0 - 8.0   Glucose, UA NEGATIVE NEGATIVE mg/dL   Hgb urine dipstick NEGATIVE NEGATIVE   Bilirubin Urine NEGATIVE NEGATIVE    Ketones, ur NEGATIVE  NEGATIVE mg/dL   Protein, ur NEGATIVE NEGATIVE mg/dL   Nitrite NEGATIVE NEGATIVE   Leukocytes,Ua NEGATIVE NEGATIVE    Comment: Performed at [facility] [address], [place], [place] [address]    Chemistries  Recent Labs  Lab 03/22/19 1654  NA 141  K 3.1*  CL 102  CO2 28  GLUCOSE 132*  BUN 32*  CREATININE 1.35*  CALCIUM 8.8*  AST 19  ALT 14  ALKPHOS 104  BILITOT 0.8   ------------------------------------------------------------------------------------------------------------------  ------------------------------------------------------------------------------------------------------------------ GFR: Estimated Creatinine Clearance: 34.3 mL/min (A) (by C-G formula based on SCr of 1.35 mg/dL (H)). Liver Function Tests: Recent Labs  Lab 03/22/19 1654  AST 19  ALT 14  ALKPHOS 104  BILITOT 0.8  PROT 6.1*  ALBUMIN 3.2*   No results for input(s): LIPASE, AMYLASE in the last 168 hours. No results for input(s): AMMONIA in the last 168 hours. Coagulation Profile: No results for input(s): INR, PROTIME in the last 168 hours. Cardiac Enzymes: No results for input(s): CKTOTAL, CKMB, CKMBINDEX, TROPONINI in the last 168 hours. BNP (last 3 results) No results for input(s): PROBNP in the last 8760 hours. HbA1C: No results for input(s): HGBA1C in the last 72 hours. CBG: No results for input(s): GLUCAP in the last 168 hours. Lipid Profile: No results for input(s): CHOL, HDL, LDLCALC, TRIG, CHOLHDL, LDLDIRECT in the last 72 hours. Thyroid Function Tests: No results for input(s): TSH, T4TOTAL, FREET4, T3FREE, THYROIDAB in the last 72 hours. Anemia Panel: No results for input(s): VITAMINB12, FOLATE, FERRITIN, TIBC, IRON, RETICCTPCT in the last 72 hours.  --------------------------------------------------------------------------------------------------------------- Urine analysis:    Component Value Date/Time   COLORURINE YELLOW 03/22/2019 1811    APPEARANCEUR CLEAR 03/22/2019 1811   LABSPEC 1.010 03/22/2019 1811   PHURINE 6.0 03/22/2019 1811   GLUCOSEU NEGATIVE 03/22/2019 1811   HGBUR NEGATIVE 03/22/2019 1811   BILIRUBINUR NEGATIVE 03/22/2019 1811   KETONESUR NEGATIVE 03/22/2019 1811   PROTEINUR NEGATIVE 03/22/2019 1811   UROBILINOGEN 0.2 02/10/2013 1041   NITRITE NEGATIVE 03/22/2019 1811   LEUKOCYTESUR NEGATIVE 03/22/2019 1811      Imaging Results:    Dg Chest 2 View  Result Date: 03/22/2019 CLINICAL DATA:  Dizziness EXAM: CHEST - 2 VIEW COMPARISON:  03/02/2019 FINDINGS: Stable cardiomediastinal contours. Aorta is calcified. No focal airspace consolidation, pleural effusion, or pneumothorax. IMPRESSION: No active cardiopulmonary disease. Electronically Signed   By: Davina Poke M.D.   On: 03/22/2019 16:14   Dg Ankle Complete Right  Result Date: 03/22/2019 CLINICAL DATA:  Recent right ankle ORIF. EXAM: RIGHT ANKLE - COMPLETE 3+ VIEW COMPARISON:  Right ankle x-rays dated February 07, 2019. FINDINGS: New subacute transverse fracture of the lateral malleolus with slight lateral displacement. Progressive callus formation surrounding the subacute distal fibular diaphyseal fracture. Subacute, healing nondisplaced fracture of the medial malleolus with increased surrounding callus formation. Subacute, mildly displaced fracture of the posterior malleolus with increased callus formation. Mild lateral subluxation of the talus with respect to the tibial plafond. No dislocation. Interval removal of the fibular plate and screw construct, as well as the medial malleolar screw. Interval tibiotalar joint fusion with anterior plate and screw construct. No definite bridging bone. There is loosening and slight backing out of both talar head screws. Unchanged midfoot osteoarthritis. Osteopenia. Diffuse soft tissue swelling about the ankle. IMPRESSION: 1. New subacute transverse fracture of the lateral malleolus. 2. Healing medial and posterior malleolar  fractures. Healing distal fibular diaphyseal fracture. 3. Interval tibiotalar joint fusion with anterior plate and screw construct. Loosening  of both talar head screws. Electronically Signed   By: Titus Dubin M.D.   On: 03/22/2019 16:22   Ct Head Wo Contrast  Result Date: 03/22/2019 CLINICAL DATA:  Vertigo episodic peripheral EXAM: CT HEAD WITHOUT CONTRAST TECHNIQUE: Contiguous axial images were obtained from the base of the skull through the vertex without intravenous contrast. COMPARISON:  CT head 03/11/2018 FINDINGS: Brain: Mild atrophy. Patchy white matter hypodensity bilaterally appears stable from the prior study. No acute infarct, hemorrhage, mass. Vascular: Negative for hyperdense vessel Skull: Negative Sinuses/Orbits: Paranasal sinuses clear. Right mastoid effusion. Bilateral cataract surgery. Other: None IMPRESSION: No acute abnormality no change from the prior study. Stable atrophy and chronic microvascular ischemic change in the white matter. Electronically Signed   By: Franchot Gallo M.D.   On: 03/22/2019 16:20       Assessment & Plan:    Principal Problem:   Dizziness Active Problems:   Chronic obstructive pulmonary disease ([name])   Type 2 diabetes mellitus with complication, with long-term current use of insulin ([name])   HTN (hypertension)   Orthostatic hypotension  Dizziness secondary to orthostatic hypotension Received 1L ns in ED Increase midodrine from 5-> 7.52m po tid ac meals Check orthostatic bp in am  Elevated troponin (new t wave flattening in V4-6) Tele Cycle troponins Check cardiac echo Emailed TBirdie Sonsfor cardiology consult, appreciate input  New subacute lateral maleolar fracture H/o Charcot foot [place] [facility] [place] [facility] [place] with Nitka Appreciate input Cont Tramadol 574mpo q6h prn   Cellulitis Check ESR Levaquin iv pharmacy to dose (due to PCN allergy)  Hypertension,  CAD, Aortic stenosis (mild), chronic diastolic CHF Cont Lasix [id number] bid Cont Lipitor 4049mo qhs Unclear why not on aspirin  DM2 Pt had Trulicity today Cont Levemir 20 units [place] qday Fsbs ac and qhs, ISS  Dm2 w neuropathy Cont Gabapentin 300m58m qhs  Copd on home o2, Pulmonary hypertension Cont Opsumit 10mg60mqday Cont Uptravi 600 micrograms po bid DC Incruse as is using atrovent, and this could actually reduce lung function DC pulmicort and 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    Code Status:  FULL CODE per patient, discussed her prior partial code situation and she requests FULL CODE,  Attempted to notify Rayna Gardner but not picking up her phone so left message that patient admitted to [facility]  [facility]: Observation: Based on patients clinical presentation and evaluation of above clinical data, I have made determination that patient meets observation criteria at this time  Time spent in minutes : 70 minutes   Dawan Farney Jani Graveln 03/22/2019 at 8:36 PM

## 2019-03-22 NOTE — ED Triage Notes (Signed)
Pt arrived via [facility] from home where pt lives alone. Home health found pt to be hypotensive at 84/52 and c/o dizziness today. Per ems, pt has been c/o generalized weakness x2 days. Pt is on 5LPM [place] at home. Hx of COPD, CHF. EMS v/s 103/52, 80hr, 93%/5Lpm, 97.9, cbg 149.

## 2019-03-22 NOTE — Telephone Encounter (Signed)
Noted.

## 2019-03-22 NOTE — Telephone Encounter (Signed)
Amy Shepard with [facility] LM on triage phone and wanted to let Dr Sharol Given know that  she went out to see patient but they had to have her sent by EMS to hospital because she was very dizzy, weak and decreased blood pressure. If you need to call her back [phone]

## 2019-03-23 ENCOUNTER — Encounter (HOSPITAL_COMMUNITY): Payer: Self-pay

## 2019-03-23 ENCOUNTER — Observation Stay (HOSPITAL_COMMUNITY): Payer: Medicare Other

## 2019-03-23 ENCOUNTER — Other Ambulatory Visit: Payer: Self-pay

## 2019-03-23 ENCOUNTER — Inpatient Hospital Stay: Payer: Medicare Other | Admitting: Primary Care

## 2019-03-23 DIAGNOSIS — I272 Pulmonary hypertension, unspecified: Secondary | ICD-10-CM

## 2019-03-23 DIAGNOSIS — R42 Dizziness and giddiness: Secondary | ICD-10-CM

## 2019-03-23 DIAGNOSIS — I959 Hypotension, unspecified: Secondary | ICD-10-CM

## 2019-03-23 LAB — CBC
HCT: 30.3 % — ABNORMAL LOW (ref 36.0–46.0)
Hemoglobin: 9.3 g/dL — ABNORMAL LOW (ref 12.0–15.0)
MCH: 26.7 pg (ref 26.0–34.0)
MCHC: 30.7 g/dL (ref 30.0–36.0)
MCV: 87.1 fL (ref 80.0–100.0)
Platelets: 288 10*3/uL (ref 150–400)
RBC: 3.48 MIL/uL — ABNORMAL LOW (ref 3.87–5.11)
RDW: 18.5 % — ABNORMAL HIGH (ref 11.5–15.5)
WBC: 5.5 10*3/uL (ref 4.0–10.5)
nRBC: 0 % (ref 0.0–0.2)

## 2019-03-23 LAB — URINE CULTURE: Culture: 30000 — AB

## 2019-03-23 LAB — COMPREHENSIVE METABOLIC PANEL
ALT: 15 U/L (ref 0–44)
AST: 18 U/L (ref 15–41)
Albumin: 3.1 g/dL — ABNORMAL LOW (ref 3.5–5.0)
Alkaline Phosphatase: 105 U/L (ref 38–126)
Anion gap: 10 (ref 5–15)
BUN: 32 mg/dL — ABNORMAL HIGH (ref 8–23)
CO2: 29 mmol/L (ref 22–32)
Calcium: 9.2 mg/dL (ref 8.9–10.3)
Chloride: 102 mmol/L (ref 98–111)
Creatinine, Ser: 1.51 mg/dL — ABNORMAL HIGH (ref 0.44–1.00)
GFR calc Af Amer: 40 mL/min — ABNORMAL LOW (ref >60)
GFR calc non Af Amer: 34 mL/min — ABNORMAL LOW (ref >60)
Glucose, Bld: 142 mg/dL — ABNORMAL HIGH (ref 70–99)
Potassium: 3.7 mmol/L (ref 3.5–5.1)
Sodium: 141 mmol/L (ref 135–145)
Total Bilirubin: 0.5 mg/dL (ref 0.3–1.2)
Total Protein: 6.1 g/dL — ABNORMAL LOW (ref 6.5–8.1)

## 2019-03-23 LAB — GLUCOSE, CAPILLARY
Glucose-Capillary: 156 mg/dL — ABNORMAL HIGH (ref 70–99)
Glucose-Capillary: 144 mg/dL — ABNORMAL HIGH (ref 70–99)
Glucose-Capillary: 159 mg/dL — ABNORMAL HIGH (ref 70–99)
Glucose-Capillary: 147 mg/dL — ABNORMAL HIGH (ref 70–99)
Glucose-Capillary: 169 mg/dL — ABNORMAL HIGH (ref 70–99)

## 2019-03-23 LAB — TROPONIN I (HIGH SENSITIVITY): Troponin I (High Sensitivity): 23 ng/L — ABNORMAL HIGH (ref <18)

## 2019-03-23 LAB — SEDIMENTATION RATE: Sed Rate: 55 mm/hr — ABNORMAL HIGH (ref 0–22)

## 2019-03-23 LAB — C-REACTIVE PROTEIN: CRP: <0.8 mg/dL (ref <1.0)

## 2019-03-23 LAB — LACTIC ACID, PLASMA: Lactic Acid, Venous: 0.9 mmol/L (ref 0.5–1.9)

## 2019-03-23 MED ORDER — GUAIFENESIN ER 600 MG PO TB12
600.0000 mg | ORAL_TABLET | Freq: Two times a day (BID) | ORAL | Status: DC
  Administered 2019-03-23 – 2019-03-29 (×14): 600 mg via ORAL
  Filled 2019-03-23 (×14): qty 1

## 2019-03-23 MED ORDER — INFLUENZA VAC A&B SA ADJ QUAD 0.5 ML IM PRSY
0.5000 mL | PREFILLED_SYRINGE | Freq: Once | INTRAMUSCULAR | Status: DC
  Filled 2019-03-23: qty 0.5

## 2019-03-23 MED ORDER — INSULIN DETEMIR 100 UNIT/ML ~~LOC~~ SOLN: 20.0000 [IU] | Freq: Every day | SUBCUTANEOUS | Status: DC

## 2019-03-23 MED ORDER — DOXYCYCLINE HYCLATE 100 MG PO TABS: 100.0000 mg | ORAL_TABLET | Freq: Two times a day (BID) | ORAL | 0 refills | Status: DC

## 2019-03-23 MED ORDER — MOMETASONE FURO-FORMOTEROL FUM 200-5 MCG/ACT IN AERO
2.0000 | INHALATION_SPRAY | Freq: Two times a day (BID) | RESPIRATORY_TRACT | Status: DC
  Administered 2019-03-23 – 2019-03-29 (×10): 2 via RESPIRATORY_TRACT
  Filled 2019-03-23: qty 8.8

## 2019-03-23 MED ORDER — DOXYCYCLINE HYCLATE 100 MG PO TABS
100.0000 mg | ORAL_TABLET | Freq: Two times a day (BID) | ORAL | Status: DC
  Administered 2019-03-23 – 2019-03-24 (×2): 100 mg via ORAL
  Filled 2019-03-23 (×3): qty 1

## 2019-03-23 MED ORDER — FUROSEMIDE 40 MG PO TABS: 40.0000 mg | ORAL_TABLET | Freq: Two times a day (BID) | ORAL | Status: DC

## 2019-03-23 MED ORDER — SELEXIPAG 600 MCG PO TABS
600.0000 ug | ORAL_TABLET | Freq: Two times a day (BID) | ORAL | Status: DC
  Administered 2019-03-23 – 2019-03-29 (×12): 600 ug via ORAL
  Filled 2019-03-23 (×11): qty 1

## 2019-03-23 MED ORDER — INSULIN DETEMIR 100 UNIT/ML ~~LOC~~ SOLN
20.0000 [IU] | Freq: Every day | SUBCUTANEOUS | Status: DC
  Administered 2019-03-23 – 2019-03-28 (×6): 20 [IU] via SUBCUTANEOUS
  Filled 2019-03-23 (×8): qty 0.2

## 2019-03-23 MED ORDER — IPRATROPIUM BROMIDE 0.02 % IN SOLN
0.5000 mg | Freq: Three times a day (TID) | RESPIRATORY_TRACT | Status: DC
  Administered 2019-03-23 – 2019-03-29 (×18): 0.5 mg via RESPIRATORY_TRACT
  Filled 2019-03-23 (×19): qty 2.5

## 2019-03-23 MED ORDER — LEVALBUTEROL HCL 0.63 MG/3ML IN NEBU
0.6300 mg | INHALATION_SOLUTION | Freq: Three times a day (TID) | RESPIRATORY_TRACT | Status: DC
  Administered 2019-03-23 – 2019-03-29 (×18): 0.63 mg via RESPIRATORY_TRACT
  Filled 2019-03-23 (×19): qty 3

## 2019-03-23 MED ORDER — SERTRALINE HCL 50 MG PO TABS
75.0000 mg | ORAL_TABLET | Freq: Every day | ORAL | Status: DC
  Administered 2019-03-23 – 2019-03-29 (×7): 75 mg via ORAL
  Filled 2019-03-23 (×7): qty 2

## 2019-03-23 MED ORDER — SALINE SPRAY 0.65 % NA SOLN: 1.0000 | NASAL | Status: DC | PRN

## 2019-03-23 MED ORDER — DOCUSATE SODIUM 100 MG PO CAPS: 100.0000 mg | ORAL_CAPSULE | Freq: Two times a day (BID) | ORAL | Status: AC | PRN

## 2019-03-23 MED ORDER — TRAMADOL HCL 50 MG PO TABS
50.0000 mg | ORAL_TABLET | Freq: Four times a day (QID) | ORAL | Status: DC | PRN
  Administered 2019-03-26: 50 mg via ORAL
  Filled 2019-03-23: qty 1

## 2019-03-23 MED ORDER — MACITENTAN 10 MG PO TABS
10.0000 mg | ORAL_TABLET | Freq: Every day | ORAL | Status: DC
  Administered 2019-03-23 – 2019-03-29 (×7): 10 mg via ORAL
  Filled 2019-03-23 (×7): qty 1

## 2019-03-23 MED ORDER — AZELASTINE HCL 0.1 % NA SOLN
1.0000 | Freq: Two times a day (BID) | NASAL | Status: DC
  Administered 2019-03-23 – 2019-03-29 (×12): 1 via NASAL
  Filled 2019-03-23: qty 30

## 2019-03-23 MED ORDER — ALBUTEROL SULFATE (2.5 MG/3ML) 0.083% IN NEBU
2.5000 mg | INHALATION_SOLUTION | RESPIRATORY_TRACT | Status: DC | PRN
  Administered 2019-03-23: 02:00:00 2.5 mg via RESPIRATORY_TRACT
  Filled 2019-03-23: qty 3

## 2019-03-23 MED ORDER — MIDODRINE HCL 2.5 MG PO TABS: 7.5000 mg | ORAL_TABLET | Freq: Three times a day (TID) | ORAL | 0 refills | Status: DC

## 2019-03-23 MED ORDER — GABAPENTIN 300 MG PO CAPS
300.0000 mg | ORAL_CAPSULE | Freq: Every day | ORAL | Status: DC
  Administered 2019-03-23 – 2019-03-28 (×7): 300 mg via ORAL
  Filled 2019-03-23 (×7): qty 1

## 2019-03-23 MED ORDER — FUROSEMIDE 40 MG PO TABS
60.0000 mg | ORAL_TABLET | Freq: Two times a day (BID) | ORAL | Status: DC
  Administered 2019-03-23: 09:00:00 60 mg via ORAL
  Filled 2019-03-23: qty 1

## 2019-03-23 MED ORDER — ATORVASTATIN CALCIUM 40 MG PO TABS
40.0000 mg | ORAL_TABLET | Freq: Every day | ORAL | Status: DC
  Administered 2019-03-23 – 2019-03-29 (×7): 40 mg via ORAL
  Filled 2019-03-23 (×7): qty 1

## 2019-03-23 MED ORDER — POTASSIUM CHLORIDE CRYS ER 20 MEQ PO TBCR
40.0000 meq | EXTENDED_RELEASE_TABLET | Freq: Once | ORAL | Status: AC
  Administered 2019-03-23: 40 meq via ORAL
  Filled 2019-03-23: qty 2

## 2019-03-23 NOTE — Progress Notes (Signed)
Patient arrived to unit, very anxious. Patient states she gets very anxious and has panic attacks. RN and NT comforted patient and helped her calm down, but pt still very anxious.   Pt asked to be put in recliner in room, 2 assist to recliner, patient feels more comfortable and also requests breathing treatment.  Patient did not want HS insulin as she says it's too late to take it.  Will continue to monitor patient.

## 2019-03-23 NOTE — Consult Note (Signed)
Advanced Heart Failure Team Consult Note   Primary Physician: Maurice Small, MD PCP-HF Cardiologist:  Dr Aundra Dubin  Reason for Consultation: Heart Failure   HPI:    Amy Shepard is seen today for evaluation of heart failure at the request of Dr Eliseo Squires.   Amy Shepard is a  73 year old with a history of hypoxemic respiratory failure, chronic diastolic CHF, pulmonary hypertension, non small cell lung cancer, CKD stage 3, and right charcot ankle fracture.     Pulmonary HTN treated with opsumit and selexipag + home oxygen. Intolerant tadalafil.   Recently admitted for scheduled removal of hardware and fusion of R ankle fracture. Post operative course was complicated by acute/chronic respiratory failure and AECOPD with refractory hypoxemia despite adequate diuresis. PCCM consulted. Placed on HFNC.  Placed bronchodilators, steroids, and antibiotics.  Prolonged hospitalization.   Prior to d/c underwent RHC on 03/03/19 with only mild PAH and normal left-sided filling pressures and output RA = 3 RV = 58/5 PA = 59/20 (34) PCW = 10 Fick cardiac output/index = 7.4/4.2 PVR = 3.2 WU FA sat = 99% PA sat = 72%, 73% SVC sat 69%  Presented [facility] ED with dizziness and low BP. CT of head was negative. Pertinent admission labs included: HS Trop 22, creeatinine 1.35, WBC 5.8, Hgb 9.6. Ortho consulted due to concern right transverse fracture of lateral malleolus with possible infection. She is nwb.   Weight on d/c (03/11/19) 167 pounds. Weight on admit 160 pounds (bed) and 154 today  Orthostatics yesterday  SBP 118 lying 99 sitting (unable to stand)  Echo 02/2019  55-60% , RV moderately reduced, D shaped septum, PAP 88 mmhg.  Review of Systems: [y] = yes, _0  = no    General: Weight gain _1 ; Weight loss _2 ; Anorexia [Y ]; Fatigue [ Y]; Fever _3 ; Chills _4 ; Weakness [Y ]   Cardiac: Chest pain/pressure _5 ; Resting SOB _6 ; Exertional SOB [ y]; Orthopnea _7 ; Pedal Edema _8 ; Palpitations _9 ;  Syncope _10 ; Presyncope [ y]; Paroxysmal nocturnal dyspnea_11    Pulmonary: Cough _12 ; Wheezing_13 ; Hemoptysis_14 ; Sputum _15 ; Snoring _16    GI: Vomiting_17 ; Dysphagia_18 ; Melena_19 ; Hematochezia _20 ; Heartburn_21 ; Abdominal pain _22 ; Constipation _23 ; Diarrhea _24 ; BRBPR _25    GU: Hematuria_26 ; Dysuria _27 ; Nocturia_28    Vascular: Pain in legs with walking Blue.Reese ]; Pain in feet with lying flat _29 ; Non-healing sores Blue.Reese ]; Stroke _30 ; TIA _31 ; Slurred speech _32 ;   Neuro: Headaches_33 ; Vertigo_34 ; Seizures_35 ; Paresthesias_36 ;Blurred vision _37 ; Diplopia _38 ; Vision changes _39    Ortho/Skin: Arthritis [ y]; Joint pain [ y]; Muscle pain _40 ; Joint swelling Blue.Reese ]; Back Pain _41 ; Rash _42    Psych: Depression_43 ; Anxiety_44    Heme: Bleeding problems _45 ; Clotting disorders _46 ; Anemia _47    Endocrine: Diabetes Blue.Reese ]; Thyroid dysfunction_48   Home Medications Prior to Admission medications   Medication Sig Start Date End Date Taking? Authorizing Provider  gabapentin (NEURONTIN) 300 MG capsule Take 1 capsule (300 mg total) by mouth at bedtime. 11/30/18  Yes Domenic Polite, MD  insulin detemir (LEVEMIR) 100 UNIT/ML injection Inject 0.15 mLs (15 Units total) into the skin 2 (two) times daily. Patient taking differently: Inject 20 Units into the skin at bedtime.  03/11/19  Yes Rayburn,  Shawn Montgomery, PA-C  insulin lispro (HUMALOG) 100 UNIT/ML injection Inject 2-11 Units into the skin See admin instructions. Inject 2-11 units into the skin three times a day before meals and at bedtime, per sliding scale   Yes [provider]  OXYGEN Inhale 4-5 L/min into the lungs continuous.    Yes [provider]  Selexipag (UPTRAVI) 600 MCG TABS Take 600 mcg by mouth 2 (two) times daily. 03/03/19  Yes Larey Dresser, MD  acetaminophen (TYLENOL) 325 MG tablet Take 650 mg by mouth every 6 (six) hours as needed for moderate pain or fever.     [provider]  albuterol (PROVENTIL) (2.5  MG/3ML) 0.083% nebulizer solution Take 3 mLs (2.5 mg total) by nebulization every 4 (four) hours as needed for wheezing or shortness of breath. 03/11/19   Rayburn, Neta Mends, PA-C  atorvastatin (LIPITOR) 40 MG tablet Take 1 tablet (40 mg total) by mouth daily. 04/20/18 01/11/20  Larey Dresser, MD  azelastine (ASTELIN) 0.1 % nasal spray Place 1 spray into both nostrils 2 (two) times daily. Use in each nostril as directed 12/28/18   Martyn Ehrich, NP  budesonide (PULMICORT) 0.5 MG/2ML nebulizer solution Take 2 mLs (0.5 mg total) by nebulization 2 (two) times daily. 03/11/19 04/11/19  Rayburn, Neta Mends, PA-C  citalopram (CELEXA) 10 MG tablet TAKE 1 TABLET BY MOUTH EVERY DAY Patient not taking: Reported on 02/10/2019 01/31/19   Charlett Blake, MD  docusate sodium (COLACE) 100 MG capsule Take 1 capsule (100 mg total) by mouth 2 (two) times daily. Patient not taking: Reported on 02/10/2019 12/14/18   Love, Ivan Anchors, PA-C  doxycycline (VIBRAMYCIN) 100 MG capsule Take 1 capsule (100 mg total) by mouth 2 (two) times daily. 01/24/19   Rayburn, Neta Mends, PA-C  formoterol (PERFOROMIST) 20 MCG/2ML nebulizer solution Take 2 mLs (20 mcg total) by nebulization 2 (two) times daily. 03/11/19 04/11/19  Rayburn, Neta Mends, PA-C  furosemide (LASIX) 40 MG tablet Take 1.5 tablets (60 mg total) by mouth 2 (two) times daily. Patient taking differently: Take 40 mg by mouth 2 (two) times daily.  01/11/19   Larey Dresser, MD  guaiFENesin (MUCINEX) 600 MG 12 hr tablet Take 1 tablet (600 mg total) by mouth 2 (two) times daily. 03/11/19 04/11/19  Rayburn, Neta Mends, PA-C  ipratropium (ATROVENT) 0.02 % nebulizer solution Take 2.5 mLs (0.5 mg total) by nebulization 3 (three) times daily. 03/11/19   Rayburn, Neta Mends, PA-C  iron polysaccharides (NIFEREX) 150 MG capsule Take 1 capsule (150 mg total) by mouth 2 (two) times daily. 12/14/18   Love, Ivan Anchors, PA-C  levalbuterol (XOPENEX) 0.63 MG/3ML  nebulizer solution Take 3 mLs (0.63 mg total) by nebulization 3 (three) times daily. 03/11/19   Rayburn, Neta Mends, PA-C  macitentan (OPSUMIT) 10 MG tablet Take 1 tablet (10 mg total) by mouth daily. 08/25/18   Larey Dresser, MD  midodrine (PROAMATINE) 5 MG tablet Take 1 tablet (5 mg total) by mouth 3 (three) times daily with meals. 12/14/18   Love, Ivan Anchors, PA-C  mometasone-formoterol (DULERA) 200-5 MCG/ACT AERO Inhale 2 puffs into the lungs 2 (two) times daily. 12/14/18   Love, Ivan Anchors, PA-C  polyethylene glycol (MIRALAX / GLYCOLAX) 17 g packet Take 17 g by mouth daily as needed for mild constipation. Patient not taking: Reported on 02/10/2019 11/30/18   Domenic Polite, MD  sertraline (ZOLOFT) 50 MG tablet Take 75 mg by mouth daily.     [provider]  sodium chloride (OCEAN) 0.65 % SOLN nasal spray Place 1 spray into both nostrils as needed for congestion. 12/14/18   Love, Ivan Anchors, PA-C  traMADol (ULTRAM) 50 MG tablet Take 1 tablet (50 mg total) by mouth every 6 (six) hours as needed for moderate pain. 03/11/19   Rayburn, Neta Mends, PA-C  TRULICITY 1.5 HE/1.7EY SOPN Inject 1.5 mg into the vein once a week.  11/08/18   [provider]  umeclidinium bromide (INCRUSE ELLIPTA) 62.5 MCG/INH AEPB Inhale 1 puff into the lungs daily. 03/11/19 04/11/19  Rayburn, Neta Mends, PA-C    Past Medical History: Past Medical History:  Diagnosis Date   Anemia    Anxiety    Arthritis    CAD (coronary artery disease)    a. Prior cath 2015 showed 40% prox AD, 50-50% mLAD, otherwise calcification but no obstruction in LCx/RCA.. Medical management recommended. b. 2017: low-risk NST.   Chronic diastolic CHF (congestive heart failure) ([name])    Chronic respiratory failure ([name])    CKD (chronic kidney disease), stage III ([name])    Complication of anesthesia    oxygen level dropped in last surgery 11/2018   COPD (chronic obstructive pulmonary disease) ([name])    Cor pulmonale  ([name])    a. felt due to advanced COPD and noncompliance with O2.   Depression    Diabetes mellitus    januvia    dx  2008   Fracture    left foot   Hx of seasonal allergies    Hypercholesteremia    Hypertension    Lung cancer ([place])    On home oxygen therapy    "2.5L; 24/7" (03/10/2017)   Pericardial effusion    a. small-moderate in 07/2016.   Pneumonia    Pulmonary hypertension ([place])    UTI (urinary tract infection)     Past Surgical History: Past Surgical History:  Procedure Laterality Date   ANKLE FUSION Right 02/11/2019   Procedure: RIGHT ANKLE FUSION;  Surgeon: Newt Minion, MD;  Location: [place];  Service: Orthopedics;  Laterality: Right;   APPLICATION OF WOUND VAC Right 11/23/2018   Procedure: Application Of Wound Vac;  Surgeon: Newt Minion, MD;  Location: [place];  Service: Orthopedics;  Laterality: Right;   CARDIAC CATHETERIZATION     1995 or 14  [facility] in [place]     bilateral cataracts   HARDWARE REMOVAL Right 02/11/2019   Procedure: REMOVAL DEEP HARDWARE RIGHT ANKLE;  Surgeon: Newt Minion, MD;  Location: [place];  Service: Orthopedics;  Laterality: Right;   LEFT HEART CATHETERIZATION WITH CORONARY ANGIOGRAM N/A 07/12/2014   Procedure: LEFT HEART CATHETERIZATION WITH CORONARY ANGIOGRAM;  Surgeon: [name], MD;  Location: [facility] CATH LAB;  Service: Cardiovascular;  Laterality: N/A;   MAXIMUM ACCESS (MAS)POSTERIOR LUMBAR INTERBODY FUSION (PLIF) 2 LEVEL N/A 02/22/2016   Procedure: Lumbar three-four - Lumbar four-five  MAXIMUM ACCESS SURGERY  POSTERIOR LUMBAR INTERBODY FUSION;  Surgeon: [name], MD;  Location: [place] NEURO ORS;  Service: Neurosurgery;  Laterality: N/A;   ORIF ANKLE FRACTURE Right 11/23/2018   Procedure: OPEN REDUCTION INTERNAL FIXATION (ORIF) RIGHT ANKLE FRACTURE;  Surgeon: Newt Minion, MD;  Location: [place];  Service: Orthopedics;  Laterality: Right;   ORIF TOE FRACTURE Left 06/12/2017   Procedure: OPEN  REDUCTION INTERNAL FIXATION (ORIF) BASE 1ST METATARSAL (TOE) FRACTURE;  Surgeon: Newt Minion, MD;  Location: [place];  Service: Orthopedics;  Laterality: Left;   RIGHT HEART  CATH N/A 08/06/2017   Procedure: RIGHT HEART CATH;  Surgeon: Larey Dresser, MD;  Location: [place] CV LAB;  Service: Cardiovascular;  Laterality: N/A;   RIGHT HEART CATH N/A 03/03/2019   Procedure: RIGHT HEART CATH;  Surgeon: Jolaine Artist, MD;  Location: [place] CV LAB;  Service: Cardiovascular;  Laterality: N/A;   THORACOTOMY Right 2010   lower   TONSILLECTOMY      Family History: Family History  Problem Relation Age of Onset   Diabetes Father    Heart failure Father    Cancer Sister    Diabetes Brother    Heart failure Brother     Social History: Social History   Socioeconomic History   Marital status: Divorced    Spouse name: Not on file   Number of children: Not on file   Years of education: Not on file   Highest education level: Not on file  Occupational History   Not on file  Social Needs   Financial resource strain: Not on file   Food insecurity    Worry: Not on file    Inability: Not on file   Transportation needs    Medical: Not on file    Non-medical: Not on file  Tobacco Use   Smoking status: Former Smoker    Packs/day: 1.00    Years: 30.00    Pack years: 30.00    Quit date: 07/14/2006    Years since quitting: 12.6   Smokeless tobacco: Never Used  Substance and Sexual Activity   Alcohol use: No   Drug use: No   Sexual activity: Not on file  Lifestyle   Physical activity    Days per week: Not on file    Minutes per session: Not on file   Stress: Not on file  Relationships   Social connections    Talks on phone: Not on file    Gets together: Not on file    Attends religious service: Not on file    Active member of club or organization: Not on file    Attends meetings of clubs or organizations: Not on file    Relationship status: Not on  file  Other Topics Concern   Not on file  Social History Narrative   Not on file    Allergies:  Allergies  Allergen Reactions   Amoxicillin Anaphylaxis, Hives, Rash and Other (See Comments)    Has patient had a PCN reaction causing immediate rash, facial/tongue/throat swelling, SOB or lightheadedness with hypotension: YES Positive reaction causing SEVERE RASH INVOLVING MUCUS MEMBRANES/SKIN NECROSIS: YES Reaction that required HOSPITALIZATION: YES Reaction occurring within the last 10 years: NO    Objective:    Vital Signs:   Temp:  [97.8 F (36.6 C)-98.6 F (37 C)] 98.6 F (37 C) (09/09 0836) Pulse Rate:  [75-83] 78 (09/09 0836) Resp:  [16-19] 18 (09/09 0836) BP: (99-145)/(45-78) 108/52 (09/09 0836) SpO2:  [83 %-100 %] 100 % (09/09 0836) FiO2 (%):  [40 %] 40 % (09/09 0755) Weight:  [70.2 kg-72.6 kg] 70.2 kg (09/09 0140) Last BM Date: 03/22/19  Weight change: Filed Weights   03/22/19 1450 03/23/19 0140  Weight: 72.6 kg 70.2 kg    Intake/Output:   Intake/Output Summary (Last 24 hours) at 03/23/2019 0924 Last data filed at 03/23/2019 0748 Gross per 24 hour  Intake 1870.88 ml  Output --  Net 1870.88 ml      Physical Exam    General:  Chronically ill appearing pale. Sitting n  chair. No resp difficulty HEENT: normal Neck: supple. JVP 7-8. Carotids 2+ bilat; no bruits. No lymphadenopathy or thyromegaly appreciated. Cor: PMI nondisplaced. Regular rate & rhythm. 2/6 TR.  Lungs: clear Abdomen: obese soft, nontender, nondistended. No hepatosplenomegaly. No bruits or masses. Good bowel sounds. Extremities: no cyanosis, clubbing, rash. No edema on left  2+ edema on R around scar. Mild erythema around surgical wound Neuro: alert & orientedx3, cranial nerves grossly intact. moves all 4 extremities w/o difficulty. Affect pleasant   Telemetry   Sinus 80s Personally reviewed   EKG    Sinus 80 +RBBB/LAFB low volts. No acute ST-T changes Personally reviewed   Labs    Basic Metabolic Panel: Recent Labs  Lab 03/22/19 1654 03/23/19 0824  NA 141 141  K 3.1* 3.7  CL 102 102  CO2 28 29  GLUCOSE 132* 142*  BUN 32* 32*  CREATININE 1.35* 1.51*  CALCIUM 8.8* 9.2    Liver Function Tests: Recent Labs  Lab 03/22/19 1654 03/23/19 0824  AST 19 18  ALT 14 15  ALKPHOS 104 105  BILITOT 0.8 0.5  PROT 6.1* 6.1*  ALBUMIN 3.2* 3.1*   No results for input(s): LIPASE, AMYLASE in the last 168 hours. No results for input(s): AMMONIA in the last 168 hours.  CBC: Recent Labs  Lab 03/22/19 1654 03/23/19 0824  WBC 5.8 5.5  NEUTROABS 4.3  --   HGB 9.6* 9.3*  HCT 31.4* 30.3*  MCV 87.2 87.1  PLT 290 288    Cardiac Enzymes: No results for input(s): CKTOTAL, CKMB, CKMBINDEX, TROPONINI in the last 168 hours.  BNP: BNP (last 3 results) Recent Labs    04/09/18 1749 02/13/19 1805 02/24/19 0325  BNP 297.8* 1,211.6* 1,344.9*    ProBNP (last 3 results) No results for input(s): PROBNP in the last 8760 hours.   CBG: Recent Labs  Lab 03/22/19 2222 03/23/19 0452 03/23/19 0704  GLUCAP 90 156* 144*    Coagulation Studies: No results for input(s): LABPROT, INR in the last 72 hours.   Imaging   Dg Chest 2 View  Result Date: 03/22/2019 CLINICAL DATA:  Dizziness EXAM: CHEST - 2 VIEW COMPARISON:  03/02/2019 FINDINGS: Stable cardiomediastinal contours. Aorta is calcified. No focal airspace consolidation, pleural effusion, or pneumothorax. IMPRESSION: No active cardiopulmonary disease. Electronically Signed   By: Davina Poke M.D.   On: 03/22/2019 16:14   Dg Ankle Complete Right  Result Date: 03/22/2019 CLINICAL DATA:  Recent right ankle ORIF. EXAM: RIGHT ANKLE - COMPLETE 3+ VIEW COMPARISON:  Right ankle x-rays dated February 07, 2019. FINDINGS: New subacute transverse fracture of the lateral malleolus with slight lateral displacement. Progressive callus formation surrounding the subacute distal fibular diaphyseal fracture. Subacute, healing  nondisplaced fracture of the medial malleolus with increased surrounding callus formation. Subacute, mildly displaced fracture of the posterior malleolus with increased callus formation. Mild lateral subluxation of the talus with respect to the tibial plafond. No dislocation. Interval removal of the fibular plate and screw construct, as well as the medial malleolar screw. Interval tibiotalar joint fusion with anterior plate and screw construct. No definite bridging bone. There is loosening and slight backing out of both talar head screws. Unchanged midfoot osteoarthritis. Osteopenia. Diffuse soft tissue swelling about the ankle. IMPRESSION: 1. New subacute transverse fracture of the lateral malleolus. 2. Healing medial and posterior malleolar fractures. Healing distal fibular diaphyseal fracture. 3. Interval tibiotalar joint fusion with anterior plate and screw construct. Loosening of both talar head screws. Electronically Signed   By: Huntley Dec  Derry M.D.   On: 03/22/2019 16:22   Ct Head Wo Contrast  Result Date: 03/22/2019 CLINICAL DATA:  Vertigo episodic peripheral EXAM: CT HEAD WITHOUT CONTRAST TECHNIQUE: Contiguous axial images were obtained from the base of the skull through the vertex without intravenous contrast. COMPARISON:  CT head 03/11/2018 FINDINGS: Brain: Mild atrophy. Patchy white matter hypodensity bilaterally appears stable from the prior study. No acute infarct, hemorrhage, mass. Vascular: Negative for hyperdense vessel Skull: Negative Sinuses/Orbits: Paranasal sinuses clear. Right mastoid effusion. Bilateral cataract surgery. Other: None IMPRESSION: No acute abnormality no change from the prior study. Stable atrophy and chronic microvascular ischemic change in the white matter. Electronically Signed   By: Franchot Gallo M.D.   On: 03/22/2019 16:20   Ct Ankle Right Wo Contrast  Result Date: 03/23/2019 CLINICAL DATA:  Recent right ankle fusion now with right fibular fracture. Assess tibiotalar  fusion for displacement EXAM: CT OF THE RIGHT ANKLE WITHOUT CONTRAST TECHNIQUE: Multidetector CT imaging of the right ankle was performed according to the standard protocol. Multiplanar CT image reconstructions were also generated. COMPARISON:  Plain films 03/22/2019. FINDINGS: There has been interval removal of distal fibular hardware since plain films from 02/07/2019. Removal of the medial malleolar screw also noted. Placement of a plate and screw fixation device across the tibiotalar joint anteriorly. There is displaced fractures through the medial malleolus and posterior malleolus of the tibia. Healing distal fibular shaft fracture with callus formation. Fracture line remains visible. There is a new fracture in the distal fibula which may be at the site of prior distal screw. There is irregularity noted within the bone in this area suggesting the possibility of infection. There is large lucency noted in the distal tibia at the site of prior syndesmotic screw. This appears larger within the tibia than expected after screw removal and may be related to infection. Lucency in the talar dome noted the screw which enters from the tibia into the talar dome concerning for infection. IMPRESSION: Removal of prior hardware. Displaced medial malleolar and posterior malleolar fractures noted. Healing distal fibular shaft fracture. Fracture line remains evident with exuberant callus. There is a new fracture in the distal fibula, likely at the site of the previous distal most screw. The bones irregular in this area. This could be related to infection. Lucency in the distal tibia at the site of previous syndesmotic screw which appears larger than expected following screw removal and may be related to infection. Lucency around the talar dome screw concerning for infection. Electronically Signed   By: Rolm Baptise M.D.   On: 03/23/2019 00:58      Medications:     Current Medications:  atorvastatin  40 mg Oral Daily    azelastine  1 spray Each Nare BID   enoxaparin (LOVENOX) injection  40 mg Subcutaneous Q24H   feeding supplement (PRO-STAT SUGAR FREE 64)  30 mL Oral BID   furosemide  60 mg Oral BID   gabapentin  300 mg Oral QHS   guaiFENesin  600 mg Oral BID   influenza vaccine adjuvanted  0.5 mL Intramuscular Once   insulin aspart  0-5 Units Subcutaneous QHS   insulin aspart  0-9 Units Subcutaneous TID WC   insulin detemir  20 Units Subcutaneous QHS   ipratropium  0.5 mg Nebulization TID   levalbuterol  0.63 mg Nebulization TID   macitentan  10 mg Oral Daily   midodrine  7.5 mg Oral TID WC   mometasone-formoterol  2 puff Inhalation BID  Selexipag  600 mcg Oral BID   sertraline  75 mg Oral Daily   sodium chloride flush  3 mL Intravenous Q12H     Infusions:  sodium chloride     levofloxacin (LEVAQUIN) IV Stopped (03/23/19 0119)        Assessment/Plan   1. Dizziness due to orthostatic hypotension and probable volume depletion CT head negative.  Has known orthostatic hypotension. Continue midodrine 7.5. mg three times a day. Unable to obtain orthostatics.   2.  R ankle fracture, [place]  Ortho following. She is NWB   3. 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 Serologies negative. Restrictive PFTs but lung parenchyma looked relatively normal on 1/18 CTA chest. Findings by PFTs and CT not consistent with emphysema. V/Q scan negative for chronic or acute PEs. Serologic workup was negative. RHC (1/19) showed moderate pulmonary arterial hypertension. Echo in 8/18 showed dilated and dysfunctional RV, similar in 11/18 and 9/19. Echo in 3/76 showed PA systolic pressure still very high at 81 mmHg. Sleep study showed nocturnal hypoxemia but not OSA. She wears home oxygen. She is currently on Opsumit and selexipag.Echo in 8/20 showed normal LV EF, moderate RV dilation/moderate dysfunction with D-shaped septum and PASP 80 mmHg, mild AS.  -Continue current dose of  selexipag. Intolerant higher doses..  - Continue Opsumit.   4. Chronic Diastolic Heart Failure  Volume status   5.CKD: Stage 3.Creatinine 1.21 yesterday  6. CAD: Nonobstructive in 2015. No s/s ischemia - Continue atorvastatin 40 mg daily   Length of Stay: 0  Darrick Grinder, NP  03/23/2019, 9:24 AM  Advanced Heart Failure Team Pager [phone] (M-F; 7a - 4p)  Please contact [facility] for night-coverage after hours (4p -7a ) and weekends on [email or web address]   Patient seen and examined with the above-signed Advanced Practice Provider and/or Housestaff. I personally reviewed laboratory data, imaging studies and relevant notes. I independently examined the patient and formulated the important aspects of the plan. I have edited the note to reflect any of my changes or salient points. I have personally discussed the plan with the patient and/or family.  73 y/o woman with multiple medical problems including PAH, chronic respiratory failure, diastolic HF, obesity DM2. Recently with prolonged admission for refractory hypoxemia after R ankle surgery. D/c'd home on 03/11/19 after full diuresis and optimization of volume status.   Presents with dizziness and hypotension. Imaging of RLE shows healing fracture with new fracture in distal fibula. No clear signs infection. Hstrop minimally elevated. No CP or SOB.   Weight down about 10 pounds since discharge. Has not been eating well but continues to drink a significant amount of fluid.   Exam as above (I edited with my findings)  Suspect she is a bit intravascularly dry. Hold lasix 1-2 days. She drinks copiously so will not need IVF. Agree with increasing midodrine to 7.5 tid. Continue PAH meds.   Troponin is only minimally elevated. No evidence ACS. No further f/u needed on this.   Glori Bickers, MD  10:36 AM

## 2019-03-23 NOTE — Plan of Care (Signed)

## 2019-03-23 NOTE — Progress Notes (Signed)
Daughter, Raymondo Band, stated she would like to be called regarding pt's discharge needs.

## 2019-03-23 NOTE — Progress Notes (Signed)
73 year old female 5 weeks post right ankle revision of  Displaced loosened ORIF trimalleolar ankle fracture to a right talotibial fusion. Hardware right distal fibula was removed. She has history of COPD and CHF, surgery was complicated with pulmonary decompensation. Discharged in mid August. Now admitted with dizziness of the last 2 days.  EXAM: RIght ankle medial and lateral incisions are healed with some minimal eschar, no open wounds. The right ankle is warm with mild swelling that is diffuse. Woody type induration. No sensation right foot.   XRAYS: right ankle with post removal of  Loosened plate lateral malleolus.there is  Lateral displacement of the talus within the right ankle mortise with widening of the medial joint line and new fracture of the right distal fibula through the old  distal most plate screw hole.  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 Need to assess for loss of tibia Fibula fixation. Make non weight bearing For now.  PLAN: CT Scan of right ankle ordered. Sed rate and CRP to assess for any sign of indulent infection. Dr. Sharol Given will be made aware and will see Tomorrow.

## 2019-03-23 NOTE — Progress Notes (Signed)
Patients oxygen staying in mid 80s, increased O2 to 7L and explained plan of care to patient to ease anxiety. Breathing treatment administered.

## 2019-03-23 NOTE — Discharge Summary (Signed)
Physician Discharge Summary  Amy Shepard OMV:[id number] DOB: 21-Mar-1946 DOA: 03/22/2019  PCP: Amy Small, MD  Admit date: 03/22/2019 Discharge date: 03/23/2019  Admitted From: home Discharge disposition: home   Recommendations for Outpatient Follow-Up:   1. Close follow up with Dr. Sharol Shepard (placed on doxy and encouraged patient to use lactobacillus for diarrhea) 2. Continue home O2 3. Continue home health- PT/RN 4. Resume lasix in 2 days   Discharge Diagnosis:   Principal Problem:   Dizziness Active Problems:   Chronic obstructive pulmonary disease ([name])   Type 2 diabetes mellitus with complication, with long-term current use of insulin ([name])   HTN (hypertension)   Orthostatic hypotension    Discharge Condition: Improved.  Diet recommendation: Low sodium, heart healthy.  Carbohydrate-modified.  Regular.  Wound care: None.  Code status: Full.   History of Present Illness:   Amy Shepard  is a 73 y.o. 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  Home health nurse came to her house today and checked her bp and recommended go to ER for orthostatic / low bp.    Hospital Course by Problem:   Dizziness and hypotension -appreciate CHF team's consult: Suspect she is a bit intravascularly dry. Hold lasix 1-2 days. She drinks copiously so will not need IVF. Agree with  increasing midodrine to 7.5 tid. Continue PAH meds.   New subacute lateral maleolar fracture H/o Charcot foot with recent surgery by Amy Shepard -discussed CT scan with Dr. Sharol Shepard-- recommend CAM boot, abx (doxy), and follow up in 1 week in his office  Cellulitis Doxy -outpatient follow up with Dr. 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 since using Dulera Cont Levalbuterol neb 1 po tid Cont Atrovent neb 1 po tid Cont Dulera 2puff bid  Anemia Hold off on ferrous sulfate  Anxiety Cont Zoloft 7565mo qday    Medical Consultants:    OrtYancey Shepard  Discharge Exam:   Vitals:   03/23/19 1150 03/23/19 1320  BP: 120/60   Pulse: 82 74  Resp: 18 18  Temp: 98 F (36.7 C)   SpO2: 98%    Vitals:   03/23/19 0755 03/23/19 0836 03/23/19 1150 03/23/19 1320  BP:  (!) 108/52 120/60   Pulse: 78 78 82 74  Resp: _0 Temp:  98.6 F (37 C) 98 F (36.7 C)   TempSrc:  Oral Oral   SpO2: 97% 100% 98%   Weight:      Height:        General exam: Appears calm and comfortable. -- feels  much better and would like to go home  The results of significant diagnostics from this hospitalization (including imaging, microbiology, ancillary and laboratory) are listed below for reference.     Procedures and Diagnostic Studies:   Dg Chest 2 View  Result Date: 03/22/2019 CLINICAL DATA:  Dizziness EXAM: CHEST - 2 VIEW COMPARISON:  03/02/2019 FINDINGS: Stable cardiomediastinal contours. Aorta is calcified. No focal airspace consolidation, pleural effusion, or pneumothorax. IMPRESSION: No active cardiopulmonary disease. Electronically Signed   By: Davina Poke  M.D.   On: 03/22/2019 16:14   Dg Ankle Complete Right  Result Date: 03/22/2019 CLINICAL DATA:  Recent right ankle ORIF. EXAM: RIGHT ANKLE - COMPLETE 3+ VIEW COMPARISON:  Right ankle x-rays dated February 07, 2019. FINDINGS: New subacute transverse fracture of the lateral malleolus with slight lateral displacement. Progressive callus formation surrounding the subacute distal fibular diaphyseal fracture. Subacute, healing nondisplaced fracture of the medial malleolus with increased surrounding callus formation. Subacute, mildly displaced fracture of the posterior malleolus with increased callus formation. Mild lateral subluxation of the talus with respect to the tibial plafond. No dislocation. Interval removal of the fibular plate and screw construct, as well as the medial malleolar screw. Interval tibiotalar joint fusion with anterior plate and screw construct. No definite bridging bone. There is loosening and slight backing out of both talar head screws. Unchanged midfoot osteoarthritis. Osteopenia. Diffuse soft tissue swelling about the ankle. IMPRESSION: 1. New subacute transverse fracture of the lateral malleolus. 2. Healing medial and posterior malleolar fractures. Healing distal fibular diaphyseal fracture. 3. Interval tibiotalar joint fusion with anterior plate and screw construct. Loosening of both talar head screws. Electronically Signed   By: Titus Dubin M.D.   On: 03/22/2019 16:22   Ct Head Wo Contrast  Result Date: 03/22/2019 CLINICAL DATA:  Vertigo episodic peripheral EXAM: CT HEAD WITHOUT CONTRAST TECHNIQUE: Contiguous axial images were obtained from the base of the skull through the vertex without intravenous contrast. COMPARISON:  CT head 03/11/2018 FINDINGS: Brain: Mild atrophy. Patchy white matter hypodensity bilaterally appears stable from the prior study. No acute infarct, hemorrhage, mass. Vascular: Negative for hyperdense vessel Skull: Negative Sinuses/Orbits: Paranasal sinuses clear.  Right mastoid effusion. Bilateral cataract surgery. Other: None IMPRESSION: No acute abnormality no change from the prior study. Stable atrophy and chronic microvascular ischemic change in the white matter. Electronically Signed   By: Franchot Gallo M.D.   On: 03/22/2019 16:20   Ct Ankle Right Wo Contrast  Result Date: 03/23/2019 CLINICAL DATA:  Recent right ankle fusion now with right fibular fracture. Assess tibiotalar fusion for displacement EXAM: CT OF THE RIGHT ANKLE WITHOUT CONTRAST TECHNIQUE: Multidetector CT imaging of the right ankle was performed according to the standard protocol. Multiplanar CT image reconstructions were also generated. COMPARISON:  Plain films 03/22/2019. FINDINGS: There has been interval removal of distal fibular hardware since plain films from 02/07/2019. Removal of the medial malleolar screw also noted. Placement of a plate and screw fixation device across the tibiotalar joint anteriorly. There is displaced fractures through the medial malleolus and posterior malleolus of the tibia. Healing distal fibular shaft fracture with callus formation. Fracture line remains visible. There is a new fracture in the distal fibula which may be at the site of prior distal screw. There is irregularity noted within the bone in this area suggesting the possibility of infection. There is large lucency noted in the distal tibia at the site of prior syndesmotic screw. This appears larger within the tibia than expected after screw removal and  may be related to infection. Lucency in the talar dome noted the screw which enters from the tibia into the talar dome concerning for infection. IMPRESSION: Removal of prior hardware. Displaced medial malleolar and posterior malleolar fractures noted. Healing distal fibular shaft fracture. Fracture line remains evident with exuberant callus. There is a new fracture in the distal fibula, likely at the site of the previous distal most screw. The bones irregular in  this area. This could be related to infection. Lucency in the distal tibia at the site of previous syndesmotic screw which appears larger than expected following screw removal and may be related to infection. Lucency around the talar dome screw concerning for infection. Electronically Signed   By: Rolm Baptise M.D.   On: 03/23/2019 00:58     Labs:   Basic Metabolic Panel: Recent Labs  Lab 03/22/19 1654 03/23/19 0824  NA 141 141  K 3.1* 3.7  CL 102 102  CO2 28 29  GLUCOSE 132* 142*  BUN 32* 32*  CREATININE 1.35* 1.51*  CALCIUM 8.8* 9.2   GFR Estimated Creatinine Clearance: 30.2 mL/min (A) (by C-G formula based on SCr of 1.51 mg/dL (H)). Liver Function Tests: Recent Labs  Lab 03/22/19 1654 03/23/19 0824  AST 19 18  ALT 14 15  ALKPHOS 104 105  BILITOT 0.8 0.5  PROT 6.1* 6.1*  ALBUMIN 3.2* 3.1*   No results for input(s): LIPASE, AMYLASE in the last 168 hours. No results for input(s): AMMONIA in the last 168 hours. Coagulation profile No results for input(s): INR, PROTIME in the last 168 hours.  CBC: Recent Labs  Lab 03/22/19 1654 03/23/19 0824  WBC 5.8 5.5  NEUTROABS 4.3  --   HGB 9.6* 9.3*  HCT 31.4* 30.3*  MCV 87.2 87.1  PLT 290 288   Cardiac Enzymes: No results for input(s): CKTOTAL, CKMB, CKMBINDEX, TROPONINI in the last 168 hours. BNP: Invalid input(s): POCBNP CBG: Recent Labs  Lab 03/22/19 2222 03/23/19 0452 03/23/19 0704 03/23/19 1206 03/23/19 1631  GLUCAP 90 156* 144* 159* 147*   D-Dimer No results for input(s): DDIMER in the last 72 hours. Hgb A1c No results for input(s): HGBA1C in the last 72 hours. Lipid Profile No results for input(s): CHOL, HDL, LDLCALC, TRIG, CHOLHDL, LDLDIRECT in the last 72 hours. Thyroid function studies No results for input(s): TSH, T4TOTAL, T3FREE, THYROIDAB in the last 72 hours.  Invalid input(s): FREET3 Anemia work up No results for input(s): VITAMINB12, FOLATE, FERRITIN, TIBC, IRON, RETICCTPCT in the last  72 hours. Microbiology Recent Results (from the past 240 hour(s))  Urine Culture     Status: Abnormal   Collection Time: 03/22/19  6:11 PM   Specimen: Urine, Random  Result Value Ref Range Status   Specimen Description URINE, RANDOM  Final   Special Requests NONE  Final   Culture (A)  Final    30,000 COLONIES/mL GROUP B STREP(S.AGALACTIAE)ISOLATED TESTING AGAINST S. AGALACTIAE NOT ROUTINELY PERFORMED DUE TO PREDICTABILITY OF AMP/PEN/VAN SUSCEPTIBILITY. Performed at [facility], [place] [address], [place], [place] [address]    Report Status 03/23/2019 FINAL  Final  SARS Coronavirus 2 [facility] order, Performed in [facility] hospital lab) Nasopharyngeal Nasopharyngeal Swab     Status: None   Collection Time: 03/22/19  9:27 PM   Specimen: Nasopharyngeal Swab  Result Value Ref Range Status   SARS Coronavirus 2 NEGATIVE NEGATIVE Final    Comment: (NOTE) If result is NEGATIVE SARS-CoV-2 target nucleic acids are NOT DETECTED. The SARS-CoV-2 RNA is generally  detectable in upper and lower  respiratory specimens during the acute phase of infection. The lowest  concentration of SARS-CoV-2 viral copies this assay can detect is 250  copies / mL. A negative result does not preclude SARS-CoV-2 infection  and should not be used as the sole basis for treatment or other  patient management decisions.  A negative result may occur with  improper specimen collection / handling, submission of specimen other  than nasopharyngeal swab, presence of viral mutation(s) within the  areas targeted by this assay, and inadequate number of viral copies  (<250 copies / mL). A negative result must be combined with clinical  observations, patient history, and epidemiological information. If result is POSITIVE SARS-CoV-2 target nucleic acids are DETECTED. The SARS-CoV-2 RNA is generally detectable in upper and lower  respiratory specimens dur ing the acute phase of infection.  Positive  results are indicative  of active infection with SARS-CoV-2.  Clinical  correlation with patient history and other diagnostic information is  necessary to determine patient infection status.  Positive results do  not rule out bacterial infection or co-infection with other viruses. If result is PRESUMPTIVE POSTIVE SARS-CoV-2 nucleic acids MAY BE PRESENT.   A presumptive positive result was obtained on the submitted specimen  and confirmed on repeat testing.  While 2019 novel coronavirus  (SARS-CoV-2) nucleic acids may be present in the submitted sample  additional confirmatory testing may be necessary for epidemiological  and / or clinical management purposes  to differentiate between  SARS-CoV-2 and other Sarbecovirus currently known to infect humans.  If clinically indicated additional testing with an alternate test  methodology [phone]) is advised. The SARS-CoV-2 RNA is generally  detectable in upper and lower respiratory sp ecimens during the acute  phase of infection. The expected result is Negative. Fact Sheet for Patients:  StrictlyIdeas.no Fact Sheet for Healthcare Providers: BankingDealers.co.za This test is not yet approved or cleared by the [place] FDA and has been authorized for detection and/or diagnosis of SARS-CoV-2 by FDA under an Emergency Use Authorization (EUA).  This EUA will remain in effect (meaning this test can be used) for the duration of the COVID-19 declaration under Section 564(b)(1) of the Act, 21 U.S.C. section 360bbb-3(b)(1), unless the authorization is terminated or revoked sooner. Performed at [facility], [place] [address], [place], [place] [address]      Discharge Instructions:   Discharge Instructions    Diet - low sodium heart healthy   Complete by: As directed    Diet Carb Modified   Complete by: As directed    Discharge instructions   Complete by: As directed    Most antibiotic cause diarrhea-- can use  over the counter lactobacillus or kefir (type of yogurt) to help with diarrhea Wear CAM boot   Increase activity slowly   Complete by: As directed      Allergies as of 03/23/2019      Reactions   Amoxicillin Anaphylaxis, Hives, Rash, Other (See Comments)   Has patient had a PCN reaction causing immediate rash, facial/tongue/throat swelling, SOB or lightheadedness with hypotension: YES Positive reaction causing SEVERE RASH INVOLVING MUCUS MEMBRANES/SKIN NECROSIS: YES Reaction that required HOSPITALIZATION: YES Reaction occurring within the last 10 years: NO   Doxycycline Diarrhea      Medication List    STOP taking these medications   citalopram 10 MG tablet Commonly known as: CELEXA   doxycycline 100 MG capsule Commonly known as: VIBRAMYCIN Replaced by: doxycycline 100 MG tablet  polyethylene glycol 17 g packet Commonly known as: MIRALAX / GLYCOLAX   sodium chloride 0.65 % Soln nasal spray Commonly known as: OCEAN     TAKE these medications   acetaminophen 325 MG tablet Commonly known as: TYLENOL Take 650 mg by mouth every 6 (six) hours as needed for moderate pain or fever.   albuterol (2.5 MG/3ML) 0.083% nebulizer solution Commonly known as: PROVENTIL Take 3 mLs (2.5 mg total) by nebulization every 4 (four) hours as needed for wheezing or shortness of breath.   atorvastatin 40 MG tablet Commonly known as: LIPITOR Take 1 tablet (40 mg total) by mouth daily.   azelastine 0.1 % nasal spray Commonly known as: ASTELIN Place 1 spray into both nostrils 2 (two) times daily. Use in each nostril as directed   budesonide 0.5 MG/2ML nebulizer solution Commonly known as: PULMICORT Take 2 mLs (0.5 mg total) by nebulization 2 (two) times daily.   docusate sodium 100 MG capsule Commonly known as: COLACE Take 1 capsule (100 mg total) by mouth 2 (two) times daily as needed for mild constipation.   doxycycline 100 MG tablet Commonly known as: VIBRA-TABS Take 1 tablet (100 mg  total) by mouth every 12 (twelve) hours. Replaces: doxycycline 100 MG capsule   furosemide 40 MG tablet Commonly known as: LASIX Take 1 tablet (40 mg total) by mouth 2 (two) times daily. Start taking on: March 25, 2019 What changed:   how much to take  These instructions start on March 25, 2019. If you are unsure what to do until then, ask your doctor or other care provider.   gabapentin 300 MG capsule Commonly known as: NEURONTIN Take 1 capsule (300 mg total) by mouth at bedtime.   guaiFENesin 600 MG 12 hr tablet Commonly known as: MUCINEX Take 1 tablet (600 mg total) by mouth 2 (two) times daily.   insulin detemir 100 UNIT/ML injection Commonly known as: LEVEMIR Inject 0.2 mLs (20 Units total) into the skin at bedtime.   insulin lispro 100 UNIT/ML injection Commonly known as: HUMALOG Inject 2-11 Units into the skin See admin instructions. Inject 2-11 units into the skin three times a day before meals and at bedtime, per sliding scale   ipratropium 0.02 % nebulizer solution Commonly known as: ATROVENT Take 2.5 mLs (0.5 mg total) by nebulization 3 (three) times daily.   iron polysaccharides 150 MG capsule Commonly known as: NIFEREX Take 1 capsule (150 mg total) by mouth 2 (two) times daily.   levalbuterol 0.63 MG/3ML nebulizer solution Commonly known as: XOPENEX Take 3 mLs (0.63 mg total) by nebulization 3 (three) times daily.   macitentan 10 MG tablet Commonly known as: Opsumit Take 1 tablet (10 mg total) by mouth daily.   midodrine 2.5 MG tablet Commonly known as: PROAMATINE Take 3 tablets (7.5 mg total) by mouth 3 (three) times daily with meals. Start taking on: March 24, 2019 What changed:   medication strength  how much to take   mometasone-formoterol 200-5 MCG/ACT Aero Commonly known as: DULERA Inhale 2 puffs into the lungs 2 (two) times daily.   OXYGEN Inhale 4-5 L/min into the lungs continuous.   Perforomist 20 MCG/2ML nebulizer  solution Generic drug: formoterol Take 2 mLs (20 mcg total) by nebulization 2 (two) times daily.   sertraline 50 MG tablet Commonly known as: ZOLOFT Take 75 mg by mouth daily.   traMADol 50 MG tablet Commonly known as: ULTRAM Take 1 tablet (50 mg total) by mouth every 6 (six) hours as needed  for moderate pain.   Trulicity 1.5 KL/4.9ZP Sopn Generic drug: Dulaglutide Inject 1.5 mg into the vein every Tuesday.   umeclidinium bromide 62.5 MCG/INH Aepb Commonly known as: INCRUSE ELLIPTA Inhale 1 puff into the lungs daily.   Uptravi 600 MCG Tabs Generic drug: Selexipag Take 600 mcg by mouth 2 (two) times daily.      Follow-up Information    Amy Small, MD Follow up in 1 week(s).   Specialty: Family Medicine Contact information: [place] [address] [address] [phone]        Larey Dresser, MD .   Specialty: Cardiology Contact information: [place] [address] [phone]        Newt Minion, MD Follow up.   Specialty: Orthopedic Surgery Why: keep appointment for Monday Contact information: [place] [address] [phone]            Time coordinating discharge: 25 min  Signed:  Geradine Girt DO  [facility] 03/23/2019, 5:14 PM

## 2019-03-23 NOTE — Progress Notes (Signed)
Orthopedic Tech Progress Note Patient Details:  Amy Shepard 1945-10-28 [id number] DR wanted patient to have a cam walker boot to the LRE but when I went to ap[ply patient stated she had one in the closet and I just reapplied that one. velcro was in great shape. Patient ID: Amy Shepard, female   DOB: 1945/09/23, 73 y.o.   MRN: [id number]   [name] 03/23/2019, 3:44 PM

## 2019-03-23 NOTE — Progress Notes (Signed)
Pharmacy called, hospital does not carry home medication, family will need to bring it.

## 2019-03-23 NOTE — Progress Notes (Signed)
After discharge, patient states she does not think she can manage at home alone and would like a SNF placement.  Await PT/OT recommendations. JV

## 2019-03-24 ENCOUNTER — Inpatient Hospital Stay: Payer: Medicare Other | Admitting: Primary Care

## 2019-03-24 DIAGNOSIS — I2721 Secondary pulmonary arterial hypertension: Secondary | ICD-10-CM | POA: Diagnosis present

## 2019-03-24 DIAGNOSIS — I2510 Atherosclerotic heart disease of native coronary artery without angina pectoris: Secondary | ICD-10-CM | POA: Diagnosis present

## 2019-03-24 DIAGNOSIS — I130 Hypertensive heart and chronic kidney disease with heart failure and stage 1 through stage 4 chronic kidney disease, or unspecified chronic kidney disease: Secondary | ICD-10-CM | POA: Diagnosis present

## 2019-03-24 DIAGNOSIS — I5082 Biventricular heart failure: Secondary | ICD-10-CM | POA: Diagnosis present

## 2019-03-24 DIAGNOSIS — E669 Obesity, unspecified: Secondary | ICD-10-CM | POA: Diagnosis present

## 2019-03-24 DIAGNOSIS — E11610 Type 2 diabetes mellitus with diabetic neuropathic arthropathy: Secondary | ICD-10-CM | POA: Diagnosis not present

## 2019-03-24 DIAGNOSIS — R42 Dizziness and giddiness: Secondary | ICD-10-CM | POA: Diagnosis not present

## 2019-03-24 DIAGNOSIS — D631 Anemia in chronic kidney disease: Secondary | ICD-10-CM | POA: Diagnosis present

## 2019-03-24 DIAGNOSIS — N183 Chronic kidney disease, stage 3 (moderate): Secondary | ICD-10-CM | POA: Diagnosis present

## 2019-03-24 DIAGNOSIS — I5032 Chronic diastolic (congestive) heart failure: Secondary | ICD-10-CM | POA: Diagnosis present

## 2019-03-24 DIAGNOSIS — I10 Essential (primary) hypertension: Secondary | ICD-10-CM | POA: Diagnosis not present

## 2019-03-24 DIAGNOSIS — I951 Orthostatic hypotension: Secondary | ICD-10-CM | POA: Diagnosis present

## 2019-03-24 DIAGNOSIS — T84038A Mechanical loosening of other internal prosthetic joint, initial encounter: Secondary | ICD-10-CM | POA: Diagnosis present

## 2019-03-24 DIAGNOSIS — J439 Emphysema, unspecified: Secondary | ICD-10-CM | POA: Diagnosis not present

## 2019-03-24 DIAGNOSIS — E860 Dehydration: Secondary | ICD-10-CM | POA: Diagnosis present

## 2019-03-24 DIAGNOSIS — E785 Hyperlipidemia, unspecified: Secondary | ICD-10-CM | POA: Diagnosis present

## 2019-03-24 DIAGNOSIS — I2781 Cor pulmonale (chronic): Secondary | ICD-10-CM | POA: Diagnosis present

## 2019-03-24 DIAGNOSIS — I5033 Acute on chronic diastolic (congestive) heart failure: Secondary | ICD-10-CM | POA: Diagnosis not present

## 2019-03-24 DIAGNOSIS — I50810 Right heart failure, unspecified: Secondary | ICD-10-CM

## 2019-03-24 DIAGNOSIS — Z20828 Contact with and (suspected) exposure to other viral communicable diseases: Secondary | ICD-10-CM | POA: Diagnosis present

## 2019-03-24 DIAGNOSIS — E1122 Type 2 diabetes mellitus with diabetic chronic kidney disease: Secondary | ICD-10-CM | POA: Diagnosis present

## 2019-03-24 DIAGNOSIS — Z6832 Body mass index (BMI) 32.0-32.9, adult: Secondary | ICD-10-CM | POA: Diagnosis not present

## 2019-03-24 DIAGNOSIS — I272 Pulmonary hypertension, unspecified: Secondary | ICD-10-CM | POA: Diagnosis not present

## 2019-03-24 DIAGNOSIS — E876 Hypokalemia: Secondary | ICD-10-CM | POA: Diagnosis not present

## 2019-03-24 DIAGNOSIS — J449 Chronic obstructive pulmonary disease, unspecified: Secondary | ICD-10-CM | POA: Diagnosis present

## 2019-03-24 DIAGNOSIS — E1140 Type 2 diabetes mellitus with diabetic neuropathy, unspecified: Secondary | ICD-10-CM | POA: Diagnosis not present

## 2019-03-24 DIAGNOSIS — L03115 Cellulitis of right lower limb: Secondary | ICD-10-CM | POA: Diagnosis not present

## 2019-03-24 DIAGNOSIS — F419 Anxiety disorder, unspecified: Secondary | ICD-10-CM | POA: Diagnosis present

## 2019-03-24 DIAGNOSIS — I959 Hypotension, unspecified: Secondary | ICD-10-CM | POA: Diagnosis present

## 2019-03-24 DIAGNOSIS — J9621 Acute and chronic respiratory failure with hypoxia: Secondary | ICD-10-CM | POA: Diagnosis not present

## 2019-03-24 DIAGNOSIS — S82831A Other fracture of upper and lower end of right fibula, initial encounter for closed fracture: Secondary | ICD-10-CM | POA: Diagnosis present

## 2019-03-24 DIAGNOSIS — E7800 Pure hypercholesterolemia, unspecified: Secondary | ICD-10-CM | POA: Diagnosis present

## 2019-03-24 LAB — BASIC METABOLIC PANEL
Anion gap: 10 (ref 5–15)
BUN: 36 mg/dL — ABNORMAL HIGH (ref 8–23)
CO2: 27 mmol/L (ref 22–32)
Calcium: 9.3 mg/dL (ref 8.9–10.3)
Chloride: 106 mmol/L (ref 98–111)
Creatinine, Ser: 1.49 mg/dL — ABNORMAL HIGH (ref 0.44–1.00)
GFR calc Af Amer: 40 mL/min — ABNORMAL LOW (ref >60)
GFR calc non Af Amer: 35 mL/min — ABNORMAL LOW (ref >60)
Glucose, Bld: 150 mg/dL — ABNORMAL HIGH (ref 70–99)
Potassium: 4 mmol/L (ref 3.5–5.1)
Sodium: 143 mmol/L (ref 135–145)

## 2019-03-24 LAB — GLUCOSE, CAPILLARY
Glucose-Capillary: 143 mg/dL — ABNORMAL HIGH (ref 70–99)
Glucose-Capillary: 146 mg/dL — ABNORMAL HIGH (ref 70–99)
Glucose-Capillary: 161 mg/dL — ABNORMAL HIGH (ref 70–99)
Glucose-Capillary: 200 mg/dL — ABNORMAL HIGH (ref 70–99)

## 2019-03-24 MED ORDER — POTASSIUM CHLORIDE CRYS ER 20 MEQ PO TBCR
40.0000 meq | EXTENDED_RELEASE_TABLET | Freq: Once | ORAL | Status: AC
  Administered 2019-03-24: 40 meq via ORAL
  Filled 2019-03-24: qty 2

## 2019-03-24 NOTE — Progress Notes (Signed)
Subjective:   No pain, sed rate is 55, expected elevated due to chronic disease, CRP is in the normal range and procalcitonin is not Elevated suggesting no infection. Her CT Scan is most consistent then with loosening of hardware due to charcot right ankle and foot and inability to sense pain associated with The right ankle fusion and she will fail fusion if the area is not  Stabile with a prosthesis that bridges the ankle and off loads the ankle and foot. She should not walk on the right ankle or  At least needs to go to as little weight bearing as possible.   Patient reports pain as mild.    Objective:   VITALS:  Temp:  [97.8 F (36.6 C)-98.4 F (36.9 C)] 97.8 F (36.6 C) (09/10 0839) Pulse Rate:  [74-82] 79 (09/10 0839) Resp:  [16-18] 18 (09/10 0839) BP: (110-127)/(53-74) 110/53 (09/10 0839) SpO2:  [92 %-98 %] 95 % (09/10 0839) FiO2 (%):  [40 %] 40 % (09/09 1320) Weight:  [75.7 kg] 75.7 kg (09/10 0200)  ABD soft Sensation intact distally Dorsiflexion/Plantar flexion intact Incision: no drainage No cellulitis present Compartment soft   LABS Recent Labs    03/22/19 1654 03/23/19 0824  HGB 9.6* 9.3*  WBC 5.8 5.5  PLT 290 288   Recent Labs    03/22/19 1654 03/23/19 0824  NA 141 141  K 3.1* 3.7  CL 102 102  CO2 28 29  BUN 32* 32*  CREATININE 1.35* 1.51*  GLUCOSE 132* 142*   No results for input(s): LABPT, INR in the last 72 hours.   Assessment/Plan:Charcot breakdown of the right ankle  Fusion with early loosening and subsequent fibula fracture.     Advance diet Up with therapy  Will order a patella tendon weight bearing double upright AFO To transfer weight to the right knee with standing and walking. Ortho Tech contacted and will place order with [employer]. PT and OT today and will likely go home with [facility] for PT and OT. Partial weight bearing right ankle. 50% until brace is available.   Basil Dess 03/24/2019, 8:45  AMPatient ID: [name], female   DOB: January 27, 1946, 73 y.o.   MRN: [id number]

## 2019-03-24 NOTE — Evaluation (Addendum)
Occupational Therapy Evaluation Patient Details Name: Amy Shepard MRN: [id number] DOB: 08-27-1945 Today's Date: 03/24/2019    History of Present Illness Amy Shepard  is a 73 y.o. 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 PTA, pt living alone and mostly ind with BADL, having groceries delivered. She mentions tub shower t/fs becoming difficult leaving her to sponge bathe given fear of falling. She had a recent admission per chart review and went home with [facility]. At time of evaluation she is min guard for transfers and min A for more dynamic BADL activities. Pt easily SOB with light activity, needing ECS cues throughout session. Per given status, recommend SNF at d/c for safe facilitation of ind BADL prior to d/c home. Will continue to follow acutely per POC listed below.     Follow Up Recommendations  SNF    Equipment Recommendations  Other (comment)(defer to next venue)    Recommendations for Other Services       Precautions / Restrictions Precautions Precautions: Fall Precaution Comments: watch O2, energy conservation techniques Required Braces or Orthoses: Other Brace Other Brace: CAM boot RLE, per ortho note  patella tendon dbl upright AFO to be delivered Restrictions Weight Bearing Restrictions: Yes RLE Weight Bearing: Partial weight bearing      Mobility Bed Mobility               General bed mobility comments: up in chair  Transfers Overall transfer level: Needs assistance Equipment used: Rolling walker (2 wheeled)     Stand pivot transfers: Min guard       General transfer comment: min guard for safety for stand pivot practice, apporpriate hand placement    Balance Overall balance assessment: Needs assistance Sitting-balance support: No upper extremity supported;Feet supported Sitting balance-Leahy Scale: Good     Standing balance support: No upper extremity supported;During functional activity Standing balance-Leahy Scale: Fair                             ADL either performed or assessed with clinical judgement   ADL Overall ADL's : Needs assistance/impaired Eating/Feeding: Set up   Grooming: Minimal assistance;Sitting Grooming Details (indicate cue type and reason): min A for completing hair, O2 sats dropping to 79-80; ECS cues Upper Body Bathing: Minimal assistance;Sitting   Lower Body Bathing: Sit to/from stand;Sitting/lateral leans;Minimal assistance   Upper Body Dressing : Set up;Sitting   Lower Body Dressing: Minimal assistance;Sitting/lateral leans   Toilet Transfer: Minimal assistance;Stand-pivot;BSC;RW   Toileting- Clothing Manipulation and Hygiene: Supervision/safety;Sit to/from stand       Functional mobility during ADLs: Supervision/safety;Min guard General ADL Comments: pt ltd 2/2 decreased activity tolerance and generalized weakness     Vision Patient Visual Report: No change from baseline  Perception     Praxis      Pertinent Vitals/Pain Pain Assessment: No/denies pain     Hand Dominance     Extremity/Trunk Assessment Upper Extremity Assessment Upper Extremity Assessment: Generalized weakness   Lower  Extremity Assessment Lower Extremity Assessment: Defer to PT evaluation       Communication Communication Communication: No difficulties   Cognition Arousal/Alertness: Awake/alert Behavior During Therapy: WFL for tasks assessed/performed Overall Cognitive Status: Within Functional Limits for tasks assessed                                     General Comments       Exercises     Shoulder Instructions      Home Living Family/patient expects to be discharged to:: Private residence Living Arrangements: Alone Available Help at Discharge: Family;Available PRN/intermittently Type of Home: Apartment Home Access: Level entry     Home Layout: One level     Bathroom Shower/Tub: Teacher, early years/pre: Handicapped height(BSC over) Bathroom Accessibility: Yes How Accessible: Accessible via wheelchair Home Equipment: [place] - 4 wheels;Shower seat;Grab bars - tub/shower;Grab bars - toilet   Additional Comments: Pt reports she has a w/c accessible apt       Prior Functioning/Environment Level of Independence: Needs assistance  Gait / Transfers Assistance Needed: uses w/c and RW ADL's / Homemaking Assistance Needed: pt has groceries delivered, but is otherwise ind   Comments: 4L at all times;has transportation through insurance        OT Problem List: Decreased activity tolerance;Impaired balance (sitting and/or standing);Decreased knowledge of use of DME or AE;Decreased knowledge of precautions;Cardiopulmonary status limiting activity;Pain;Decreased strength      OT Treatment/Interventions: Self-care/ADL training;Energy conservation;DME and/or AE instruction;Therapeutic activities;Patient/family education;Balance training;Therapeutic exercise    OT Goals(Current goals can be found in the care plan section) Acute Rehab OT Goals Patient Stated Goal: go to rehab and get stronger OT Goal Formulation: With patient Time For Goal Achievement:  04/07/19 Potential to Achieve Goals: Good  OT Frequency: Min 2X/week   Barriers to D/C:            Co-evaluation              AM-PAC OT "6 Clicks" Daily Activity     Outcome Measure Help from another person eating meals?: None Help from another person taking care of personal grooming?: None Help from another person toileting, which includes using toliet, bedpan, or urinal?: A Little Help from another person bathing (including washing, rinsing, drying)?: A Little Help from another person to put on and taking off regular upper body clothing?: A Little Help from another person to put on and taking off regular lower body clothing?: A Little 6 Click Score: 20   End of Session Equipment Utilized During Treatment: Oxygen;Rolling walker;Gait belt Nurse Communication: Mobility status  Activity Tolerance: Patient tolerated treatment well Patient left: in chair;with call bell/phone within reach  OT Visit Diagnosis: Other abnormalities of gait and mobility (R26.89);Muscle weakness (generalized) (M62.81)                Time: 3794-3276 OT Time Calculation (min): 20 min Charges:  OT General Charges $OT Visit: 1 Visit OT Evaluation $OT Eval Moderate Complexity: 1 Mod  Zenovia Jarred, MSOT, OTR/L Behavioral Health OT/ Acute Relief OT [facility] Office: [phone]   Zenovia Jarred 03/24/2019, 11:23 AM

## 2019-03-24 NOTE — Evaluation (Addendum)
Physical Therapy Evaluation Patient Details Name: Amy Shepard MRN: [id number] DOB: 1946-04-24 Today's Date: 03/24/2019   History of Present Illness  Amy Shepard  is a 73 y.o. female admitted with dizziness with hypotension and new right fibula fx. 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 She states she was walking limited distances and doing seated HEP but reports fatigue in bil UE with all mobility and functional activity this session. Pt with decreased ability to perform transfers and cannot maintain NWB RLE for transfers. Pt with decreased mobility, function and activity tolerance who will benefit from acute therapy to maximize mobility and safety.  On arrival SpO2 89% on 5.5L with drop to 78% with basic pivot with increased time and cues to recover. HR 85     Follow Up Recommendations SNF;Supervision for mobility/OOB    Equipment Recommendations  None recommended by PT    Recommendations for Other Services       Precautions / Restrictions Precautions Precautions: Fall Precaution Comments: watch O2 Required Braces or Orthoses: Other Brace Other Brace: CAM boot RLE, per ortho note patella tendon dbl upright AFO to be delivered Restrictions Weight Bearing Restrictions: Yes RLE Weight Bearing: Partial weight bearing RLE Partial Weight Bearing Percentage or Pounds: limited to transfers until aFO with less than 50%      Mobility  Bed Mobility               General bed mobility comments: up in chair with request to stay in  chair  Transfers Overall transfer level: Needs assistance Equipment used: Rolling walker (2 wheeled) Transfers: Sit to/from [facility] Sit to Stand: Min guard Stand pivot transfers: Min guard       General transfer comment: guarding with use of RW to stand from recliner<>bed. pt unable to off weight RLE fully and maintains at least 50% weight bearing on RLE to transfer, cues for sequence and safety  Ambulation/Gait             General Gait Details: unable due to weight bearing status and respiratory difficulty  Stairs            Wheelchair Mobility    Modified Rankin (Stroke Patients Only)       Balance Overall balance assessment: Needs assistance Sitting-balance support: No upper extremity supported;Feet supported Sitting balance-Leahy Scale: Good     Standing balance support: Bilateral upper extremity supported Standing balance-Leahy Scale: Poor                               Pertinent Vitals/Pain Pain Assessment: No/denies pain    Home Living Family/patient expects to be discharged to:: Private residence Living Arrangements: Alone Available Help at Discharge: Family;Available PRN/intermittently Type of Home: Apartment Home Access: Level entry     Home Layout: One level Home Equipment: Walker - 4 wheels;Shower seat;Grab bars - tub/shower;Grab bars - toilet;Wheelchair - manual Additional Comments: Pt reports she has a w/c accessible apt     Prior Function Level of Independence: Needs assistance   Gait / Transfers Assistance Needed: uses w/c  and RW  ADL's / Homemaking Assistance Needed: pt has groceries delivered, has assist for laundry and cleaning has been functioning mostly from seated position  Comments: pt reports 4L at home, chart states 5L     Hand Dominance        Extremity/Trunk Assessment   Upper Extremity Assessment Upper Extremity Assessment: Generalized weakness    Lower Extremity  Assessment Lower Extremity Assessment: Generalized weakness       Communication   Communication: No difficulties  Cognition Arousal/Alertness: Awake/alert Behavior During Therapy: WFL for tasks assessed/performed Overall Cognitive Status: Within Functional Limits for tasks assessed                                        General Comments      Exercises General Exercises - Lower Extremity Long Arc Quad: AROM;Both;Seated;10 reps Hip Flexion/Marching: AROM;Both;Seated;10 reps   Assessment/Plan    PT Assessment Patient needs continued PT services  PT Problem List Decreased strength;Decreased activity tolerance;Decreased mobility;Decreased knowledge of use of DME;Cardiopulmonary status limiting activity;Obesity;Decreased safety awareness;Decreased balance       PT Treatment Interventions DME instruction;Functional mobility training;Therapeutic activities;Therapeutic exercise;Patient/family education    PT Goals (Current goals can be found in the Care Plan section)  Acute Rehab PT Goals Patient Stated Goal: be able to take care of myself PT Goal Formulation: With patient Time For Goal Achievement: 04/07/19 Potential to Achieve Goals: Fair    Frequency Min 2X/week   Barriers to discharge Decreased caregiver support      Co-evaluation               AM-PAC PT "6 Clicks" Mobility  Outcome Measure Help needed turning from your back to your side while in a flat bed without using bedrails?: None Help needed moving from lying on your back to sitting on the side of a flat bed without using bedrails?: None Help needed moving to and from a bed to a chair (including a wheelchair)?: A Little Help needed standing up from a chair using your arms (e.g., wheelchair or bedside chair)?: A Little Help needed to walk in hospital room?: A Lot Help needed climbing 3-5 steps with a railing? : Total 6 Click Score: 17    End of Session Equipment Utilized During Treatment:  Oxygen;Other (comment)(CAM boot) Activity Tolerance: Patient tolerated treatment well Patient left: in chair;with call bell/phone within reach Nurse Communication: Mobility status PT Visit Diagnosis: Muscle weakness (generalized) (M62.81);Other abnormalities of gait and mobility (R26.89)    Time: 1230-1250 PT Time Calculation (min) (ACUTE ONLY): 20 min   Charges:   PT Evaluation $PT Eval Moderate Complexity: 1 Mod          Reagan, PT Acute Rehabilitation Services Pager: [phone] Office: [phone]   Amalie Koran B Azayla Polo 03/24/2019, 1:52 PM

## 2019-03-24 NOTE — TOC Initial Note (Signed)
Transition of Care [facility]) - Initial/Assessment Note    Patient Details  Name: Amy Shepard MRN: [id number] Date of Birth: 06-Aug-1945  [facility]) CM/SW Contact:    Alberteen Sam, [place] Phone Number: [phone] 03/24/2019, 1:52 PM  Clinical Narrative:                  CSW consulted with patient regarding plan at discharge she reports she is open to going to SNF per OT rec, pending PT rec at this time. She reports no SNF preference, agreeable for CSW to fax referrals to [facility] and then inform her of bed offers.   Pending PT recs for insurance auth for SNF.    Expected Discharge Plan: Skilled Nursing Facility Barriers to Discharge: Continued Medical Work up   Patient Goals and CMS Choice Patient states their goals for this hospitalization and ongoing recovery are:: to go to rehab then home CMS Medicare.gov Compare Post Acute Care list provided to:: Patient Choice offered to / list presented to : Patient  Expected Discharge Plan and Services Expected Discharge Plan: [place] Choice: [place] [facility] Living arrangements for the past 2 months: Single Family Home Expected Discharge Date: 03/23/19                                    Prior Living Arrangements/Services Living arrangements for the past 2 months: Single Family Home Lives with:: Self Patient language and need for interpreter reviewed:: Yes Do you feel safe going back to the place where you live?: Yes      Need for Family Participation in Patient Care: No (Comment) Care giver support system in place?: Yes (comment)   Criminal Activity/Legal Involvement Pertinent to Current Situation/Hospitalization: No - Comment as needed  Activities of Daily Living Home Assistive Devices/Equipment: Eyeglasses, CBG Meter, Oxygen, Wheelchair ADL Screening (condition at time of admission) Patient's cognitive ability adequate to safely complete daily  activities?: Yes Is the patient deaf or have difficulty hearing?: No Does the patient have difficulty seeing, even when wearing glasses/contacts?: No Does the patient have difficulty concentrating, remembering, or making decisions?: Yes Patient able to express need for assistance with ADLs?: Yes Does the patient have difficulty dressing or bathing?: Yes Independently performs ADLs?: Yes (appropriate for developmental age) Does the patient have difficulty walking or climbing stairs?: Yes Weakness of Legs: Both Weakness of Arms/Hands: Both  Permission Sought/Granted Permission sought to share information with : Case Manager, Chartered certified accountant granted to share information with : Yes, Verbal Permission Granted     Permission granted to share info w AGENCY: SNFs        Emotional Assessment Appearance:: Appears stated age Attitude/Demeanor/Rapport: Gracious Affect (typically observed): Calm Orientation: : Oriented to Self, Oriented to Place, Oriented to  Time, Oriented to Situation Alcohol / Substance Use: Not Applicable Psych Involvement: No (comment)  Admission diagnosis:  Hypokalemia [E87.6] Dizziness [R42] Cellulitis of right lower extremity [L03.115] Hypotension, unspecified hypotension type [I95.9] Orthostatic hypotension [I95.1] Patient Active Problem List   Diagnosis Date Noted  . Dizziness 03/22/2019  . Orthostatic hypotension 03/22/2019  . Hypoxemia   . Palliative care by specialist   . DNR (do not resuscitate) discussion   . Right upper lobe pulmonary nodule 02/13/2019  . Closed right ankle fracture, with malunion, subsequent encounter 02/11/2019  . Charcot ankle, right   . Pain  from implanted hardware   . SOB (shortness of breath)   . Acute on chronic systolic (congestive) heart failure ([place])   . Benign hypertensive heart and kidney disease with diastolic CHF, NYHA class II and CKD stage III ([place])   . Diabetes mellitus type 2 in obese ([place])    . Supplemental oxygen dependent   . Trimalleolar fracture of ankle, closed, right, sequela 12/01/2018  . Hypoxia   . Acute on chronic anemia   . Closed dislocation of right talus   . Acute on chronic postoperative respiratory failure ([place])   . Postprocedural hypotension   . Trimalleolar fracture of ankle, closed, right, initial encounter   . Syndesmotic disruption of ankle, right, initial encounter   . Ankle fracture 11/22/2018  . Pain in joint, ankle and foot 04/10/2018  . Closed left fibular fracture 04/10/2018  . CKD (chronic kidney disease), stage IV ([place]) 04/09/2018  . Near syncope 04/09/2018  . Dyspnea   . Bronchitis, acute 03/11/2018  . Fatigue 03/11/2018  . Anemia due to stage 4 chronic kidney disease ([place]) 03/11/2018  . Pulmonary fibrosis ([place]) 03/11/2018  . Pulmonary hypertension, unspecified ([place]) 03/11/2018  . Anemia 03/11/2018  . Lisfranc dislocation, left, subsequent encounter 06/08/2017  . Chronic respiratory failure with hypoxia ([place]) 06/03/2017  . Falls 06/03/2017  . Hypokalemia 06/03/2017  . Sepsis secondary to UTI ([place]) 06/02/2017  . Dog bite 05/02/2017  . CHF exacerbation ([place]) 05/02/2017  . Acute diastolic CHF (congestive heart failure) ([place]) 05/02/2017  . Chronic diastolic CHF (congestive heart failure) ([place]) 04/02/2017  . RVF (right ventricular failure) ([place]) 04/02/2017  . Acute on chronic respiratory failure ([place]) 03/10/2017  . Chronic pain 03/10/2017  . Lactic acidosis 02/10/2017  . Acute respiratory failure with hypoxia ([place]) 02/09/2017  . Acute on chronic diastolic CHF (congestive heart failure) ([place]) 06/23/2016  . HTN (hypertension) 06/23/2016  . Type 2 diabetes mellitus with complication, with long-term current use of insulin ([place]) 03/11/2016  . Depression 03/11/2016  . Chronic obstructive pulmonary disease ([place]) 03/10/2016  . S/P lumbar spinal fusion 02/22/2016  . Coronary artery disease involving native coronary artery 07/12/2014  . Pain in  the chest   . Malignant neoplasm of lower lobe of right lung ([place]) 05/25/2014  . Lung cancer ([place]) 09/01/2012   PCP:  Maurice Small, MD Pharmacy:   [place] [address] [place] [address] Phone: [phone] Fax: [place], [place] [address] Phone: [phone] Fax: [phone]  [facility] - [place], [place] - [address] [place] [address] Phone: [phone] Fax: [phone]     Social Determinants of Health ([place]) Interventions    Readmission Risk Interventions Readmission Risk Prevention Plan 11/29/2018  Transportation Screening Complete  PCP or Specialist Appt within 3-5 Days Complete  HRI or [place] Complete  Social Work Consult for [place] Planning/Counseling Complete  Palliative Care Screening Not Applicable  Medication Review Press photographer) Complete  Some recent data might be hidden

## 2019-03-24 NOTE — Discharge Instructions (Signed)
Partial weight bearing right leg until brace is available then may weight bear fully 100% when the new brace is available. Continue to use current brace when up. Maintain 50% weight bearing status until new brace is available. Call the office for a return appt with Dr. Sharol Given in 1-2 weeks. Shower with bag about the right brace and leg and foot to prevent moisture entering brace.  Home Health for PT and OT.

## 2019-03-24 NOTE — Progress Notes (Signed)
Orthopedic Tech Progress Note Patient Details:  Amy Shepard 06/30/1946 [id number]  Patient ID: Amy Shepard, female   DOB: [birth year], 73 y.o.   MRN: [id number]   Amy Shepard 03/24/2019, 9:01 [facility] [employer] for right Double upright AFO brace.

## 2019-03-24 NOTE — Progress Notes (Signed)
Patient ID: Amy Shepard, female   DOB: March 18, 1946, 73 y.o.   MRN: [id number]     Advanced Heart Failure Rounding Note  PCP-Cardiologist: No primary care provider on file.   Subjective:    No complaints this morning.  No dizziness, breathing ok.     Objective:   Weight Range: 75.7 kg Body mass index is 31.53 kg/m.   Vital Signs:   Temp:  [97.8 F (36.6 C)-98.4 F (36.9 C)] 97.8 F (36.6 C) (09/10 0839) Pulse Rate:  [74-82] 79 (09/10 0926) Resp:  [16-18] 18 (09/10 0839) BP: (109-127)/(47-74) 109/47 (09/10 0926) SpO2:  [92 %-98 %] 94 % (09/10 0926) FiO2 (%):  [40 %] 40 % (09/09 1320) Weight:  [75.7 kg] 75.7 kg (09/10 0200) Last BM Date: 03/22/19  Weight change: Filed Weights   03/22/19 1450 03/23/19 0140 03/24/19 0200  Weight: 72.6 kg 70.2 kg 75.7 kg    Intake/Output:   Intake/Output Summary (Last 24 hours) at 03/24/2019 1003 Last data filed at 03/24/2019 0835 Gross per 24 hour  Intake 960 ml  Output 2100 ml  Net -1140 ml      Physical Exam    General:  Well appearing. No resp difficulty HEENT: Normal Neck: Supple. JVP not elevated. Carotids 2+ bilat; no bruits. No lymphadenopathy or thyromegaly appreciated. Cor: PMI nondisplaced. Regular rate & rhythm. No rubs, gallops or murmurs. Lungs: Distant BS Abdomen: Soft, nontender, nondistended. No hepatosplenomegaly. No bruits or masses. Good bowel sounds. Extremities: No cyanosis, clubbing, rash, edema Neuro: Alert & orientedx3, cranial nerves grossly intact. moves all 4 extremities w/o difficulty. Affect pleasant   Telemetry   NSR 70s (personally reviewed)  Labs    CBC Recent Labs    03/22/19 1654 03/23/19 0824  WBC 5.8 5.5  NEUTROABS 4.3  --   HGB 9.6* 9.3*  HCT 31.4* 30.3*  MCV 87.2 87.1  PLT 290 409   Basic Metabolic Panel Recent Labs    03/22/19 1654 03/23/19 0824  NA 141 141  K 3.1* 3.7  CL 102 102  CO2 28 29  GLUCOSE 132* 142*  BUN 32* 32*  CREATININE 1.35* 1.51*  CALCIUM  8.8* 9.2   Liver Function Tests Recent Labs    03/22/19 1654 03/23/19 0824  AST 19 18  ALT 14 15  ALKPHOS 104 105  BILITOT 0.8 0.5  PROT 6.1* 6.1*  ALBUMIN 3.2* 3.1*   No results for input(s): LIPASE, AMYLASE in the last 72 hours. Cardiac Enzymes No results for input(s): CKTOTAL, CKMB, CKMBINDEX, TROPONINI in the last 72 hours.  BNP: BNP (last 3 results) Recent Labs    04/09/18 1749 02/13/19 1805 02/24/19 0325  BNP 297.8* 1,211.6* 1,344.9*    ProBNP (last 3 results) No results for input(s): PROBNP in the last 8760 hours.   D-Dimer No results for input(s): DDIMER in the last 72 hours. Hemoglobin A1C No results for input(s): HGBA1C in the last 72 hours. Fasting Lipid Panel No results for input(s): CHOL, HDL, LDLCALC, TRIG, CHOLHDL, LDLDIRECT in the last 72 hours. Thyroid Function Tests No results for input(s): TSH, T4TOTAL, T3FREE, THYROIDAB in the last 72 hours.  Invalid input(s): FREET3  Other results:   Imaging     No results found.   Medications:     Scheduled Medications: . atorvastatin  40 mg Oral Daily  . azelastine  1 spray Each Nare BID  . doxycycline  100 mg Oral Q12H  . enoxaparin (LOVENOX) injection  40 mg Subcutaneous Q24H  . feeding supplement (  PRO-STAT SUGAR FREE 64)  30 mL Oral BID  . gabapentin  300 mg Oral QHS  . guaiFENesin  600 mg Oral BID  . influenza vaccine adjuvanted  0.5 mL Intramuscular Once  . insulin aspart  0-5 Units Subcutaneous QHS  . insulin aspart  0-9 Units Subcutaneous TID WC  . insulin detemir  20 Units Subcutaneous QHS  . ipratropium  0.5 mg Nebulization TID  . levalbuterol  0.63 mg Nebulization TID  . macitentan  10 mg Oral Daily  . midodrine  7.5 mg Oral TID WC  . mometasone-formoterol  2 puff Inhalation BID  . potassium chloride  40 mEq Oral Once  . Selexipag  600 mcg Oral BID  . sertraline  75 mg Oral Daily  . sodium chloride flush  3 mL Intravenous Q12H     Infusions: . sodium chloride        PRN Medications:  sodium chloride, acetaminophen **OR** acetaminophen, albuterol, sodium chloride, sodium chloride flush, traMADol   Assessment/Plan   1. Orthostatic hypotension: She had been on Lasix 60 mg bid at home.  RHC in 8/20 prior to discharge showed optimized filling pressures.  Weight down.  Suspect that she was mildly dehydrated.  Chronic RV failure likely plays a role as well.  - Hold Lasix 1 more day, restart lower dose.  - Midodrine increased to 7.5 mg tid.  2. Chronic hypoxemic respiratory failure: She has been on 5L home oxygen at baseline. Past PFTs were restrictive, but last CT chest in 6/20 showed emphysema.  - Stable.  3. 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 Serologies negative. Restrictive PFTs but lung parenchyma looked relatively normal on 1/18 CTA chest. V/Q scan negative for chronic or acute PEs. Serologic workup was negative. RHC (1/19) showed moderate pulmonary arterial hypertension. Echo in 8/18 showed dilated and dysfunctional RV, similar in 11/18 and 9/19. Echo in 0/25 showed PA systolic pressure still very high at 81 mmHg. Sleep study showed nocturnal hypoxemia but not OSA. She wears home oxygen. She is currently on Opsumit and selexipag.Echo in 8/20 showed normal LV EF, moderate RV dilation/moderate dysfunction with D-shaped septum and PASP 80 mmHg, mild AS.  Repeat RHC in 8/20 showed optimized filling pressures with moderate PH, PVR only 3.2.  PH seems stable.  -Continue current dose of selexipag. Intolerant higher doses..  - Continue Opsumit.  - Given visual changes,have not hadher restart tadalafil.  4.Chronic diastolic CHF: With prominent RV failure.She is not volume overloaded on exam, suspect she is mildly intravascularly depleted.  Creatinine to 1.5.  - Hold Lasix today, restart 40 mg po bid tomorrow.  5. CKD: Stage 3.Creatinine 1.5 today. 6. CAD: Nonobstructive in 2015. No exertional CP.  - Continue atorvastatin 40 mg daily.   7. R ankle Fracture, S/P ORIF 11/26/2018.Admitted in 8/20 for debridement, removalhardware,R ankle fusion. 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  - To be managed conservatively with boot for now, per ortho.   Length of Stay: 0  [name], MD  03/24/2019, 10:03 AM  Advanced Heart Failure Team Pager [phone] (M-F; 7a - 4p)  Please contact [facility] for night-coverage after hours (4p -7a ) and weekends on [email or web address]

## 2019-03-24 NOTE — Progress Notes (Signed)
Progress Note    KALIFA CADDEN  IIR:[id number] DOB: 09/18/1945  DOA: 03/22/2019 PCP: Maurice Small, MD    Brief Narrative:    Medical records reviewed and are as summarized below:  Amy Shepard is an 73 y.o. 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   Assessment/Plan:   Principal Problem:   Dizziness Active Problems:   Chronic obstructive pulmonary disease ([name])   Type 2 diabetes mellitus with complication, with long-term current use of insulin ([name])   HTN (hypertension)   RVF (right ventricular failure) ([place])   Pulmonary hypertension, unspecified ([name])   Orthostatic hypotension   Dizziness and hypotension -appreciate CHF team's consult: Suspect she is a bit intravascularly dry. Hold lasix until in AM. - She drinks copiously so will not need IVF. Agree with increasing midodrine to 7.5 tid. Continue PAH meds.   New subacute lateral maleolar fracture H/o Charcot foot with recent surgery by Dr.Duda -discussed CT scan with Dr. Sharol Given-- recommend CAM boot  and follow up in 1 week in his office-- seen by Dr. Deno Etienne: Will order a patella tendon weight bearing double upright AFO To transfer weight to the right knee with standing and walking. 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   Family Communication/Anticipated D/C date and plan/Code Status   DVT prophylaxis: Lovenox ordered. Code Status: Full Code.  Family Communication:  Disposition Plan: not safe to go home as she lives alone-- needs SNF-- will change to inpatient as she has exceeded > 2 midnights   Medical Consultants:    Ortho  CHF team     Subjective:   Discomfort in her foot  Objective:    Vitals:   03/24/19 0857 03/24/19 0926 03/24/19 1325 03/24/19 1339  BP:  (!) 109/47    Pulse:  79    Resp:      Temp:      TempSrc:      SpO2: 95% 94% 95% (!) 78%  Weight:      Height:        Intake/Output Summary (Last 24 hours) at 03/24/2019 1504 Last data filed at 03/24/2019 1019 Gross per 24 hour  Intake 720 ml  Output 1725 ml  Net -1005 ml   Filed Weights   03/22/19 1450 03/23/19 0140 03/24/19 0200  Weight: 72.6 kg 70.2 kg 75.7 kg    Exam: In chair, on O2 rrr No increased work of breathing Right foot in boot  Data Reviewed:   I have personally reviewed following labs and imaging studies:  Labs: Labs show the following:   Basic Metabolic Panel: Recent Labs  Lab 03/22/19 1654 03/23/19 0824 03/24/19 1043  NA 141 141 143  K 3.1* 3.7 4.0  CL 102 102 106  CO2 _0 GLUCOSE 132* 142* 150*  BUN 32* 32* 36*  CREATININE 1.35* 1.51* 1.49*  CALCIUM  8.8* 9.2 9.3   GFR Estimated Creatinine Clearance: 31.8 mL/min (A) (by C-G formula based on SCr of 1.49 mg/dL (H)). Liver Function Tests: Recent Labs  Lab 03/22/19 1654 03/23/19 0824  AST 19 18  ALT 14 15  ALKPHOS 104 105  BILITOT 0.8 0.5  PROT 6.1* 6.1*  ALBUMIN 3.2* 3.1*   No results for input(s): LIPASE, AMYLASE in the last 168 hours. No results for input(s): AMMONIA in the last 168 hours. Coagulation profile No results for input(s): INR, PROTIME in the last 168 hours.  CBC: Recent Labs  Lab 03/22/19 1654 03/23/19 0824  WBC 5.8 5.5  NEUTROABS 4.3  --   HGB 9.6* 9.3*  HCT 31.4* 30.3*  MCV 87.2 87.1  PLT 290 288   Cardiac Enzymes: No results for input(s): CKTOTAL, CKMB, CKMBINDEX, TROPONINI in the last 168 hours. BNP (last 3 results) No results for input(s): PROBNP in the last 8760 hours. CBG: Recent Labs  Lab 03/23/19 1206 03/23/19 1631 03/23/19 2132 03/24/19 0605 03/24/19 1145  GLUCAP 159* 147* 169* 143* 146*   D-Dimer: No results for input(s): DDIMER in the last 72 hours. Hgb A1c: No results for input(s): HGBA1C in the last 72 hours. Lipid Profile: No results for input(s): CHOL, HDL, LDLCALC, TRIG, CHOLHDL, LDLDIRECT in the last 72 hours. Thyroid function studies: No results for input(s): TSH, T4TOTAL, T3FREE, THYROIDAB in the last 72 hours.  Invalid input(s): FREET3 Anemia work up: No results for input(s): VITAMINB12, FOLATE, FERRITIN, TIBC, IRON, RETICCTPCT in the last 72 hours. Sepsis Labs: Recent Labs  Lab 03/22/19 1654 03/23/19 0232 03/23/19 0824  WBC 5.8  --  5.5  LATICACIDVEN 0.6 0.9  --     Microbiology Recent Results (from the past 240 hour(s))  Urine Culture     Status: Abnormal   Collection Time: 03/22/19  6:11 PM   Specimen: Urine, Random  Result Value Ref Range Status   Specimen Description URINE, RANDOM  Final   Special Requests NONE  Final   Culture (A)  Final    30,000 COLONIES/mL GROUP B  STREP(S.AGALACTIAE)ISOLATED TESTING AGAINST S. AGALACTIAE NOT ROUTINELY PERFORMED DUE TO PREDICTABILITY OF AMP/PEN/VAN SUSCEPTIBILITY. Performed at [facility], [place] [address] [place], [place] [address]    Report Status 03/23/2019 FINAL  Final  SARS Coronavirus 2 [facility] order, Performed in [facility] hospital lab) Nasopharyngeal Nasopharyngeal Swab     Status: None   Collection Time: 03/22/19  9:27 PM   Specimen: Nasopharyngeal Swab  Result Value Ref Range Status   SARS Coronavirus 2 NEGATIVE NEGATIVE Final    Comment: (NOTE) If result is NEGATIVE SARS-CoV-2 target nucleic acids are NOT DETECTED. The SARS-CoV-2 RNA is generally detectable in upper and lower  respiratory specimens during the acute phase of infection. The lowest  concentration of SARS-CoV-2 viral copies this assay can  detect is 250  copies / mL. A negative result does not preclude SARS-CoV-2 infection  and should not be used as the sole basis for treatment or other  patient management decisions.  A negative result may occur with  improper specimen collection / handling, submission of specimen other  than nasopharyngeal swab, presence of viral mutation(s) within the  areas targeted by this assay, and inadequate number of viral copies  (<250 copies / mL). A negative result must be combined with clinical  observations, patient history, and epidemiological information. If result is POSITIVE SARS-CoV-2 target nucleic acids are DETECTED. The SARS-CoV-2 RNA is generally detectable in upper and lower  respiratory specimens dur ing the acute phase of infection.  Positive  results are indicative of active infection with SARS-CoV-2.  Clinical  correlation with patient history and other diagnostic information is  necessary to determine patient infection status.  Positive results do  not rule out bacterial infection or co-infection with other viruses. If result is PRESUMPTIVE POSTIVE SARS-CoV-2 nucleic acids MAY BE  PRESENT.   A presumptive positive result was obtained on the submitted specimen  and confirmed on repeat testing.  While 2019 novel coronavirus  (SARS-CoV-2) nucleic acids may be present in the submitted sample  additional confirmatory testing may be necessary for epidemiological  and / or clinical management purposes  to differentiate between  SARS-CoV-2 and other Sarbecovirus currently known to infect humans.  If clinically indicated additional testing with an alternate test  methodology [phone]) is advised. The SARS-CoV-2 RNA is generally  detectable in upper and lower respiratory sp ecimens during the acute  phase of infection. The expected result is Negative. Fact Sheet for Patients:  StrictlyIdeas.no Fact Sheet for Healthcare Providers: BankingDealers.co.za This test is not yet approved or cleared by the [place] FDA and has been authorized for detection and/or diagnosis of SARS-CoV-2 by FDA under an Emergency Use Authorization (EUA).  This EUA will remain in effect (meaning this test can be used) for the duration of the COVID-19 declaration under Section 564(b)(1) of the Act, 21 U.S.C. section 360bbb-3(b)(1), unless the authorization is terminated or revoked sooner. Performed at [facility], [place] [address] [place], [place] [address]     Procedures and diagnostic studies:  Dg Chest 2 View  Result Date: 03/22/2019 CLINICAL DATA:  Dizziness EXAM: CHEST - 2 VIEW COMPARISON:  03/02/2019 FINDINGS: Stable cardiomediastinal contours. Aorta is calcified. No focal airspace consolidation, pleural effusion, or pneumothorax. IMPRESSION: No active cardiopulmonary disease. Electronically Signed   By: Davina Poke M.D.   On: 03/22/2019 16:14   Dg Ankle Complete Right  Result Date: 03/22/2019 CLINICAL DATA:  Recent right ankle ORIF. EXAM: RIGHT ANKLE - COMPLETE 3+ VIEW COMPARISON:  Right ankle x-rays dated February 07, 2019.  FINDINGS: New subacute transverse fracture of the lateral malleolus with slight lateral displacement. Progressive callus formation surrounding the subacute distal fibular diaphyseal fracture. Subacute, healing nondisplaced fracture of the medial malleolus with increased surrounding callus formation. Subacute, mildly displaced fracture of the posterior malleolus with increased callus formation. Mild lateral subluxation of the talus with respect to the tibial plafond. No dislocation. Interval removal of the fibular plate and screw construct, as well as the medial malleolar screw. Interval tibiotalar joint fusion with anterior plate and screw construct. No definite bridging bone. There is loosening and slight backing out of both talar head screws. Unchanged midfoot osteoarthritis. Osteopenia. Diffuse soft tissue swelling about the ankle. IMPRESSION: 1. New subacute transverse fracture of the lateral malleolus.  2. Healing medial and posterior malleolar fractures. Healing distal fibular diaphyseal fracture. 3. Interval tibiotalar joint fusion with anterior plate and screw construct. Loosening of both talar head screws. Electronically Signed   By: Titus Dubin M.D.   On: 03/22/2019 16:22   Ct Head Wo Contrast  Result Date: 03/22/2019 CLINICAL DATA:  Vertigo episodic peripheral EXAM: CT HEAD WITHOUT CONTRAST TECHNIQUE: Contiguous axial images were obtained from the base of the skull through the vertex without intravenous contrast. COMPARISON:  CT head 03/11/2018 FINDINGS: Brain: Mild atrophy. Patchy white matter hypodensity bilaterally appears stable from the prior study. No acute infarct, hemorrhage, mass. Vascular: Negative for hyperdense vessel Skull: Negative Sinuses/Orbits: Paranasal sinuses clear. Right mastoid effusion. Bilateral cataract surgery. Other: None IMPRESSION: No acute abnormality no change from the prior study. Stable atrophy and chronic microvascular ischemic change in the white matter.  Electronically Signed   By: Franchot Gallo M.D.   On: 03/22/2019 16:20   Ct Ankle Right Wo Contrast  Result Date: 03/23/2019 CLINICAL DATA:  Recent right ankle fusion now with right fibular fracture. Assess tibiotalar fusion for displacement EXAM: CT OF THE RIGHT ANKLE WITHOUT CONTRAST TECHNIQUE: Multidetector CT imaging of the right ankle was performed according to the standard protocol. Multiplanar CT image reconstructions were also generated. COMPARISON:  Plain films 03/22/2019. FINDINGS: There has been interval removal of distal fibular hardware since plain films from 02/07/2019. Removal of the medial malleolar screw also noted. Placement of a plate and screw fixation device across the tibiotalar joint anteriorly. There is displaced fractures through the medial malleolus and posterior malleolus of the tibia. Healing distal fibular shaft fracture with callus formation. Fracture line remains visible. There is a new fracture in the distal fibula which may be at the site of prior distal screw. There is irregularity noted within the bone in this area suggesting the possibility of infection. There is large lucency noted in the distal tibia at the site of prior syndesmotic screw. This appears larger within the tibia than expected after screw removal and may be related to infection. Lucency in the talar dome noted the screw which enters from the tibia into the talar dome concerning for infection. IMPRESSION: Removal of prior hardware. Displaced medial malleolar and posterior malleolar fractures noted. Healing distal fibular shaft fracture. Fracture line remains evident with exuberant callus. There is a new fracture in the distal fibula, likely at the site of the previous distal most screw. The bones irregular in this area. This could be related to infection. Lucency in the distal tibia at the site of previous syndesmotic screw which appears larger than expected following screw removal and may be related to infection.  Lucency around the talar dome screw concerning for infection. Electronically Signed   By: Rolm Baptise M.D.   On: 03/23/2019 00:58    Medications:    atorvastatin  40 mg Oral Daily   azelastine  1 spray Each Nare BID   enoxaparin (LOVENOX) injection  40 mg Subcutaneous Q24H   feeding supplement (PRO-STAT SUGAR FREE 64)  30 mL Oral BID   gabapentin  300 mg Oral QHS   guaiFENesin  600 mg Oral BID   influenza vaccine adjuvanted  0.5 mL Intramuscular Once   insulin aspart  0-5 Units Subcutaneous QHS   insulin aspart  0-9 Units Subcutaneous TID WC   insulin detemir  20 Units Subcutaneous QHS   ipratropium  0.5 mg Nebulization TID   levalbuterol  0.63 mg Nebulization TID   macitentan  10  mg Oral Daily   midodrine  7.5 mg Oral TID WC   mometasone-formoterol  2 puff Inhalation BID   Selexipag  600 mcg Oral BID   sertraline  75 mg Oral Daily   sodium chloride flush  3 mL Intravenous Q12H   Continuous Infusions:  sodium chloride       LOS: 0 days   Geradine Girt  [facility]   How to contact the [facility] Attending or Consulting provider [place] or covering provider during after hours [place], for this patient?  1. Check the care team in [facility] and look for a) attending/consulting [facility] provider listed and b) the [facility] team listed 2. Log into [email or web address] and use [place]'s universal password to access. If you do not have the password, please contact the hospital operator. 3. Locate the [facility] provider you are looking for under [facility] and page to a number that you can be directly reached. 4. If you still have difficulty reaching the provider, please page the [facility] (Director on Call) for the Hospitalists listed on amion for assistance.  03/24/2019, 3:04 PM

## 2019-03-24 NOTE — NC FL2 (Signed)
[place] LEVEL OF CARE SCREENING TOOL     IDENTIFICATION  Patient Name: Amy Shepard Birthdate: [birth year] Sex: female Admission Date (Current Location): 03/22/2019  [facility] and [place] Number:  Herbalist and Address:  [facility], [place] [address], [place], [place] [address]      Provider Number: [id number]  Attending Physician Name and Address:  Geradine Girt, DO  Relative Name and Phone Number:  Rayna ([phone]    Current Level of Care: Hospital Recommended Level of Care: [place] Prior Approval Number:    Date Approved/Denied:   PASRR Number: [id number]  Discharge Plan: SNF    Current Diagnoses: Patient Active Problem List   Diagnosis Date Noted  . Dizziness 03/22/2019  . Orthostatic hypotension 03/22/2019  . Hypoxemia   . Palliative care by specialist   . DNR (do not resuscitate) discussion   . Right upper lobe pulmonary nodule 02/13/2019  . Closed right ankle fracture, with malunion, subsequent encounter 02/11/2019  . Charcot ankle, right   . Pain from implanted hardware   . SOB (shortness of breath)   . Acute on chronic systolic (congestive) heart failure ([place])   . Benign hypertensive heart and kidney disease with diastolic CHF, NYHA class II and CKD stage III ([place])   . Diabetes mellitus type 2 in obese ([place])   . Supplemental oxygen dependent   . Trimalleolar fracture of ankle, closed, right, sequela 12/01/2018  . Hypoxia   . Acute on chronic anemia   . Closed dislocation of right talus   . Acute on chronic postoperative respiratory failure ([place])   . Postprocedural hypotension   . Trimalleolar fracture of ankle, closed, right, initial encounter   . Syndesmotic disruption of ankle, right, initial encounter   . Ankle fracture 11/22/2018  . Pain in joint, ankle and foot 04/10/2018  . Closed left fibular fracture 04/10/2018  . CKD (chronic kidney disease), stage IV ([place])  04/09/2018  . Near syncope 04/09/2018  . Dyspnea   . Bronchitis, acute 03/11/2018  . Fatigue 03/11/2018  . Anemia due to stage 4 chronic kidney disease ([place]) 03/11/2018  . Pulmonary fibrosis ([place]) 03/11/2018  . Pulmonary hypertension, unspecified ([place]) 03/11/2018  . Anemia 03/11/2018  . Lisfranc dislocation, left, subsequent encounter 06/08/2017  . Chronic respiratory failure with hypoxia ([place]) 06/03/2017  . Falls 06/03/2017  . Hypokalemia 06/03/2017  . Sepsis secondary to UTI ([place]) 06/02/2017  . Dog bite 05/02/2017  . CHF exacerbation ([place]) 05/02/2017  . Acute diastolic CHF (congestive heart failure) ([place]) 05/02/2017  . Chronic diastolic CHF (congestive heart failure) ([place]) 04/02/2017  . RVF (right ventricular failure) ([place]) 04/02/2017  . Acute on chronic respiratory failure ([place]) 03/10/2017  . Chronic pain 03/10/2017  . Lactic acidosis 02/10/2017  . Acute respiratory failure with hypoxia ([place]) 02/09/2017  . Acute on chronic diastolic CHF (congestive heart failure) ([place]) 06/23/2016  . HTN (hypertension) 06/23/2016  . Type 2 diabetes mellitus with complication, with long-term current use of insulin ([place]) 03/11/2016  . Depression 03/11/2016  . Chronic obstructive pulmonary disease ([place]) 03/10/2016  . S/P lumbar spinal fusion 02/22/2016  . Coronary artery disease involving native coronary artery 07/12/2014  . Pain in the chest   . Malignant neoplasm of lower lobe of right lung ([place]) 05/25/2014  . Lung cancer ([place]) 09/01/2012    Orientation RESPIRATION BLADDER Height & Weight     Self, Time, Situation, Place  O2(5.5 L/min nasal cannula) Incontinent, External catheter Weight:  166 lb 14.2 oz (75.7 kg) Height:  _0  (154.9 cm)  BEHAVIORAL SYMPTOMS/MOOD NEUROLOGICAL BOWEL NUTRITION STATUS      Continent Diet(see discharge summary)  AMBULATORY STATUS COMMUNICATION OF NEEDS Skin   Limited Assist Verbally Other (Comment), Skin abrasions(abrasions right and left arms, right ankle  closed surgical incision)                       Personal Care Assistance Level of Assistance  Bathing, Feeding, Dressing, Total care Bathing Assistance: Limited assistance Feeding assistance: Independent Dressing Assistance: Limited assistance Total Care Assistance: Limited assistance   Functional Limitations Info  Sight, Hearing, Speech Sight Info: Adequate Hearing Info: Adequate Speech Info: Adequate    SPECIAL CARE FACTORS FREQUENCY  PT (By licensed PT), OT (By licensed OT)     PT Frequency: min 5x weekly OT Frequency: min 5x weekly            Contractures Contractures Info: Not present    Additional Factors Info  Code Status, Allergies Code Status Info: full Allergies Info: Amoxicillin, Doxycycline           Current Medications (03/24/2019):  This is the current hospital active medication list Current Facility-Administered Medications  Medication Dose Route Frequency Provider Last Rate Last Dose  . 0.9 %  sodium chloride infusion  250 mL Intravenous PRN Jani Gravel, MD      . acetaminophen (TYLENOL) tablet 650 mg  650 mg Oral Q6H PRN Jani Gravel, MD   650 mg at 03/23/19 2100   Or  . acetaminophen (TYLENOL) suppository 650 mg  650 mg Rectal Q6H PRN Jani Gravel, MD      . albuterol (PROVENTIL) (2.5 MG/3ML) 0.083% nebulizer solution 2.5 mg  2.5 mg Nebulization Q4H PRN Jani Gravel, MD   2.5 mg at 03/23/19 0214  . atorvastatin (LIPITOR) tablet 40 mg  40 mg Oral Daily Jani Gravel, MD   40 mg at 03/24/19 5885  . azelastine (ASTELIN) 0.1 % nasal spray 1 spray  1 spray Each Nare BID Jani Gravel, MD   1 spray at 03/24/19 0933  . enoxaparin (LOVENOX) injection 40 mg  40 mg Subcutaneous Q24H Jani Gravel, MD   40 mg at 03/24/19 0926  . feeding supplement (PRO-STAT SUGAR FREE 64) liquid 30 mL  30 mL Oral BID Jani Gravel, MD   30 mL at 03/23/19 2100  . gabapentin (NEURONTIN) capsule 300 mg  300 mg Oral QHS Jani Gravel, MD   300 mg at 03/23/19 2100  . guaiFENesin (MUCINEX) 12 hr  tablet 600 mg  600 mg Oral BID Jani Gravel, MD   600 mg at 03/24/19 0277  . influenza vaccine adjuvanted (FLUAD) injection 0.5 mL  0.5 mL Intramuscular Once Jani Gravel, MD   Stopped at 03/23/19 [phone]  . insulin aspart (novoLOG) injection 0-5 Units  0-5 Units Subcutaneous QHS Jani Gravel, MD      . insulin aspart (novoLOG) injection 0-9 Units  0-9 Units Subcutaneous TID WC Jani Gravel, MD   1 Units at 03/24/19 1151  . insulin detemir (LEVEMIR) injection 20 Units  20 Units Subcutaneous QHS Jani Gravel, MD   20 Units at 03/23/19 2112  . ipratropium (ATROVENT) nebulizer solution 0.5 mg  0.5 mg Nebulization TID Jani Gravel, MD   0.5 mg at 03/24/19 1324  . levalbuterol (XOPENEX) nebulizer solution 0.63 mg  0.63 mg Nebulization TID Jani Gravel, MD   0.63 mg at 03/24/19 1324  . macitentan (OPSUMIT) tablet 10 mg  10 mg Oral Daily Jani Gravel, MD   10 mg at 03/24/19 0926  . midodrine (PROAMATINE) tablet 7.5 mg  7.5 mg Oral TID WC Jani Gravel, MD   7.5 mg at 03/24/19 1150  . mometasone-formoterol (DULERA) 200-5 MCG/ACT inhaler 2 puff  2 puff Inhalation BID Jani Gravel, MD   2 puff at 03/24/19 0857  . Selexipag TABS 600 mcg  600 mcg Oral BID Jani Gravel, MD   600 mcg at 03/24/19 1149  . sertraline (ZOLOFT) tablet 75 mg  75 mg Oral Daily Jani Gravel, MD   75 mg at 03/24/19 4982  . sodium chloride (OCEAN) 0.65 % nasal spray 1 spray  1 spray Each Nare PRN Jani Gravel, MD      . sodium chloride flush (NS) 0.9 % injection 3 mL  3 mL Intravenous Q12H Jani Gravel, MD   3 mL at 03/24/19 0934  . sodium chloride flush (NS) 0.9 % injection 3 mL  3 mL Intravenous PRN Jani Gravel, MD      . traMADol Veatrice Bourbon) tablet 50 mg  50 mg Oral Q6H PRN Jani Gravel, MD       Facility-Administered Medications Ordered in Other Encounters  Medication Dose Route Frequency Provider Last Rate Last Dose  . diatrizoate meglumine-sodium (GASTROGRAFIN) 66-10 % solution 30 mL  30 mL Oral PRN Bjorn Loser, MD   30 mL at 11/30/15 0820     Discharge  Medications: Please see discharge summary for a list of discharge medications.  Relevant Imaging Results:  Relevant Lab Results:   Additional Information SSN: [id number]  Alberteen Sam, LCSW

## 2019-03-25 ENCOUNTER — Inpatient Hospital Stay (HOSPITAL_COMMUNITY): Payer: Medicare Other

## 2019-03-25 DIAGNOSIS — I5033 Acute on chronic diastolic (congestive) heart failure: Secondary | ICD-10-CM

## 2019-03-25 LAB — GLUCOSE, CAPILLARY
Glucose-Capillary: 105 mg/dL — ABNORMAL HIGH (ref 70–99)
Glucose-Capillary: 167 mg/dL — ABNORMAL HIGH (ref 70–99)
Glucose-Capillary: 122 mg/dL — ABNORMAL HIGH (ref 70–99)
Glucose-Capillary: 174 mg/dL — ABNORMAL HIGH (ref 70–99)

## 2019-03-25 LAB — BASIC METABOLIC PANEL
Anion gap: 7 (ref 5–15)
BUN: 32 mg/dL — ABNORMAL HIGH (ref 8–23)
CO2: 28 mmol/L (ref 22–32)
Calcium: 9.2 mg/dL (ref 8.9–10.3)
Chloride: 106 mmol/L (ref 98–111)
Creatinine, Ser: 1.26 mg/dL — ABNORMAL HIGH (ref 0.44–1.00)
GFR calc Af Amer: 49 mL/min — ABNORMAL LOW (ref >60)
GFR calc non Af Amer: 43 mL/min — ABNORMAL LOW (ref >60)
Glucose, Bld: 116 mg/dL — ABNORMAL HIGH (ref 70–99)
Potassium: 4.1 mmol/L (ref 3.5–5.1)
Sodium: 141 mmol/L (ref 135–145)

## 2019-03-25 MED ORDER — FUROSEMIDE 40 MG PO TABS
40.0000 mg | ORAL_TABLET | Freq: Two times a day (BID) | ORAL | Status: DC
  Administered 2019-03-25 – 2019-03-29 (×9): 40 mg via ORAL
  Filled 2019-03-25 (×9): qty 1

## 2019-03-25 NOTE — Progress Notes (Signed)
PROGRESS NOTE    Amy Shepard  KGY:[id number] DOB: [birth year] DOA: 03/22/2019 PCP: Maurice Small, MD   Brief Narrative:  73 year old lady with prior h/o hypertension, hyperlipidemia, DM, stage 3 CKD, CAD, diastolic heart failure, mild aortic stenosis, lung cancer s/p right lower lung superior segmentectomy , COPD on 5 lit of [place] oxygen at home, pulmonary hypertension, h/o orthostatic hypotension, recent admission for right ankle fusion on 02/11/2019 due to charcot collapse, non union of right ankle fracture presents with dizziness since 5 days.    Assessment & Plan:   Principal Problem:   Dizziness Active Problems:   Chronic obstructive pulmonary disease ([name])   Type 2 diabetes mellitus with complication, with long-term current use of insulin ([name])   HTN (hypertension)   RVF (right ventricular failure) ([place])   Pulmonary hypertension, unspecified ([name])   Orthostatic hypotension   Orthostatic hypotension and associated dizziness:  Probably secondary to dehydration.  Much improved today.   Hypertension:  Well controlled.  Resume home meds.   Chronic respiratory failure on 5 lit of [place] oxygen due to COPD and moderate pulmonary hypertension;  Scattered wheezing on exam today.  Hypoxic earlier today requiring upto 9 lit transiently. Pt remained asymptomatic.  Currently back to 5 lit of [place] oxygen.  cxr ordered for further evaluation.  Use IS.   Anemia of chronic disease:  Hemoglobin around 9 and stable.  monitor counts prn.   Pulmonary hypertension:  Continue withopsumit and Slexipag.  Echocardiogram last month revealed preserved LVEF and mod IV dilation.  Diabetes Mellitus with neuropathy:  CBG (last 3)  Recent Labs    03/24/19 2119 03/25/19 0527 03/25/19 1151  GLUCAP 200* 105* 167*   Resume SSI.  Not well controlled. Add 2 units of novlog TIDAC.  Continue with levemir 20 units daily.    Chronic diastolic CHF:  Restart lasix 40 mg BID.    H/o of CAD:  Non  obstructive.  Pt denies any chest pain or sob.    Right ankle fracture New sub acute lateral malleolar fracture:   s/p ORIF, followed by ankle fusion, charcot and non union of ankle fracture.   Waiting for double upright AFO, .  Orthopedics recommends to continue boot for 50% PWB .    DVT prophylaxis: lovenox.  Code Status: full code.  Family Communication: none at bedside.  Disposition Plan: pending SNF placement.    Consultants:   Orthopedics.   Cardiology.   Procedures: none.  Antimicrobials: none.   Subjective:  RN reports she was hypoxic requiring upto 9 lit transiently.  She is back on 5lit of [place] oxygen and sitting in the chair.  She denies any chest pain or sob.   Objective: Vitals:   03/25/19 0521 03/25/19 0815 03/25/19 1000 03/25/19 1033  BP: (!) 106/58  (!) 115/56 (!) 110/55  Pulse: 77 80  77  Resp: 17 18    Temp: 97.8 F (36.6 C)     TempSrc: Oral     SpO2: 90%  (!) 87% 90%  Weight: 77.6 kg     Height:        Intake/Output Summary (Last 24 hours) at 03/25/2019 1310 Last data filed at 03/25/2019 1030 Gross per 24 hour  Intake 702 ml  Output 900 ml  Net -198 ml   Filed Weights   03/23/19 0140 03/24/19 0200 03/25/19 0521  Weight: 70.2 kg 75.7 kg 77.6 kg    Examination:  General exam: in mild distress from dyspnea.  Respiratory system:  diminished at bases, scattered wheezing heard.  tachypnea present.  Cardiovascular system: S1 & S2 heard, RRR,  Gastrointestinal system: Abdomen is nondistended, soft and nontender. No organomegaly or masses felt. Normal bowel sounds heard. Central nervous system: Alert and oriented. No focal neurological deficits. Extremities: Symmetric 5 x 5 power. Skin: No rashes, lesions or ulcers Psychiatry:Mood & affect appropriate.     Data Reviewed: I have personally reviewed following labs and imaging studies  CBC: Recent Labs  Lab 03/22/19 1654 03/23/19 0824  WBC 5.8 5.5  NEUTROABS 4.3  --   HGB 9.6* 9.3*   HCT 31.4* 30.3*  MCV 87.2 87.1  PLT 290 287   Basic Metabolic Panel: Recent Labs  Lab 03/22/19 1654 03/23/19 0824 03/24/19 1043 03/25/19 0424  NA 141 141 143 141  K 3.1* 3.7 4.0 4.1  CL 102 102 106 106  CO2 _0 GLUCOSE 132* 142* 150* 116*  BUN 32* 32* 36* 32*  CREATININE 1.35* 1.51* 1.49* 1.26*  CALCIUM 8.8* 9.2 9.3 9.2   GFR: Estimated Creatinine Clearance: 38 mL/min (A) (by C-G formula based on SCr of 1.26 mg/dL (H)). Liver Function Tests: Recent Labs  Lab 03/22/19 1654 03/23/19 0824  AST 19 18  ALT 14 15  ALKPHOS 104 105  BILITOT 0.8 0.5  PROT 6.1* 6.1*  ALBUMIN 3.2* 3.1*   No results for input(s): LIPASE, AMYLASE in the last 168 hours. No results for input(s): AMMONIA in the last 168 hours. Coagulation Profile: No results for input(s): INR, PROTIME in the last 168 hours. Cardiac Enzymes: No results for input(s): CKTOTAL, CKMB, CKMBINDEX, TROPONINI in the last 168 hours. BNP (last 3 results) No results for input(s): PROBNP in the last 8760 hours. HbA1C: No results for input(s): HGBA1C in the last 72 hours. CBG: Recent Labs  Lab 03/24/19 1145 03/24/19 1646 03/24/19 2119 03/25/19 0527 03/25/19 1151  GLUCAP 146* 161* 200* 105* 167*   Lipid Profile: No results for input(s): CHOL, HDL, LDLCALC, TRIG, CHOLHDL, LDLDIRECT in the last 72 hours. Thyroid Function Tests: No results for input(s): TSH, T4TOTAL, FREET4, T3FREE, THYROIDAB in the last 72 hours. Anemia Panel: No results for input(s): VITAMINB12, FOLATE, FERRITIN, TIBC, IRON, RETICCTPCT in the last 72 hours. Sepsis Labs: Recent Labs  Lab 03/22/19 1654 03/23/19 0232  LATICACIDVEN 0.6 0.9    Recent Results (from the past 240 hour(s))  Urine Culture     Status: Abnormal   Collection Time: 03/22/19  6:11 PM   Specimen: Urine, Random  Result Value Ref Range Status   Specimen Description URINE, RANDOM  Final   Special Requests NONE  Final   Culture (A)  Final    30,000 COLONIES/mL GROUP  B STREP(S.AGALACTIAE)ISOLATED TESTING AGAINST S. AGALACTIAE NOT ROUTINELY PERFORMED DUE TO PREDICTABILITY OF AMP/PEN/VAN SUSCEPTIBILITY. Performed at [facility], [place] [address], [place], [place] [address]    Report Status 03/23/2019 FINAL  Final  SARS Coronavirus 2 [facility] order, Performed in [facility] hospital lab) Nasopharyngeal Nasopharyngeal Swab     Status: None   Collection Time: 03/22/19  9:27 PM   Specimen: Nasopharyngeal Swab  Result Value Ref Range Status   SARS Coronavirus 2 NEGATIVE NEGATIVE Final    Comment: (NOTE) If result is NEGATIVE SARS-CoV-2 target nucleic acids are NOT DETECTED. The SARS-CoV-2 RNA is generally detectable in upper and lower  respiratory specimens during the acute phase of infection. The lowest  concentration of SARS-CoV-2 viral copies this assay can detect is 250  copies /  mL. A negative result does not preclude SARS-CoV-2 infection  and should not be used as the sole basis for treatment or other  patient management decisions.  A negative result may occur with  improper specimen collection / handling, submission of specimen other  than nasopharyngeal swab, presence of viral mutation(s) within the  areas targeted by this assay, and inadequate number of viral copies  (<250 copies / mL). A negative result must be combined with clinical  observations, patient history, and epidemiological information. If result is POSITIVE SARS-CoV-2 target nucleic acids are DETECTED. The SARS-CoV-2 RNA is generally detectable in upper and lower  respiratory specimens dur ing the acute phase of infection.  Positive  results are indicative of active infection with SARS-CoV-2.  Clinical  correlation with patient history and other diagnostic information is  necessary to determine patient infection status.  Positive results do  not rule out bacterial infection or co-infection with other viruses. If result is PRESUMPTIVE POSTIVE SARS-CoV-2 nucleic acids MAY BE  PRESENT.   A presumptive positive result was obtained on the submitted specimen  and confirmed on repeat testing.  While 2019 novel coronavirus  (SARS-CoV-2) nucleic acids may be present in the submitted sample  additional confirmatory testing may be necessary for epidemiological  and / or clinical management purposes  to differentiate between  SARS-CoV-2 and other Sarbecovirus currently known to infect humans.  If clinically indicated additional testing with an alternate test  methodology [phone]) is advised. The SARS-CoV-2 RNA is generally  detectable in upper and lower respiratory sp ecimens during the acute  phase of infection. The expected result is Negative. Fact Sheet for Patients:  StrictlyIdeas.no Fact Sheet for Healthcare Providers: BankingDealers.co.za This test is not yet approved or cleared by the [place] FDA and has been authorized for detection and/or diagnosis of SARS-CoV-2 by FDA under an Emergency Use Authorization (EUA).  This EUA will remain in effect (meaning this test can be used) for the duration of the COVID-19 declaration under Section 564(b)(1) of the Act, 21 U.S.C. section 360bbb-3(b)(1), unless the authorization is terminated or revoked sooner. Performed at [facility], [place] [address], [place], [place] [address]          Radiology Studies: No results found.      Scheduled Meds: . atorvastatin  40 mg Oral Daily  . azelastine  1 spray Each Nare BID  . enoxaparin (LOVENOX) injection  40 mg Subcutaneous Q24H  . feeding supplement (PRO-STAT SUGAR FREE 64)  30 mL Oral BID  . furosemide  40 mg Oral BID  . gabapentin  300 mg Oral QHS  . guaiFENesin  600 mg Oral BID  . influenza vaccine adjuvanted  0.5 mL Intramuscular Once  . insulin aspart  0-5 Units Subcutaneous QHS  . insulin aspart  0-9 Units Subcutaneous TID WC  . insulin detemir  20 Units Subcutaneous QHS  . ipratropium  0.5 mg  Nebulization TID  . levalbuterol  0.63 mg Nebulization TID  . macitentan  10 mg Oral Daily  . midodrine  7.5 mg Oral TID WC  . mometasone-formoterol  2 puff Inhalation BID  . Selexipag  600 mcg Oral BID  . sertraline  75 mg Oral Daily  . sodium chloride flush  3 mL Intravenous Q12H   Continuous Infusions: . sodium chloride       LOS: 1 day    Time spent: 42 MINUTES.     Hosie Poisson, MD [facility] Pager [phone] If 7PM-7AM, please contact  night-coverage [email or web address] Password [email or web address] 03/25/2019, 1:10 PM

## 2019-03-25 NOTE — Progress Notes (Signed)
Patient ID: Amy Shepard, female   DOB: [birth year], 73 y.o.   MRN: [id number]     Advanced Heart Failure Rounding Note  PCP-Cardiologist: No primary care provider on file.   Subjective:    No complaints this morning.  No dizziness, breathing ok.    Objective:   Weight Range: 77.6 kg Body mass index is 32.33 kg/m.   Vital Signs:   Temp:  [97.8 F (36.6 C)-98.1 F (36.7 C)] 97.8 F (36.6 C) (09/11 0521) Pulse Rate:  [59-80] 80 (09/11 0815) Resp:  [16-18] 18 (09/11 0815) BP: (106-121)/(47-59) 106/58 (09/11 0521) SpO2:  [78 %-100 %] 90 % (09/11 0521) Weight:  [77.6 kg] 77.6 kg (09/11 0521) Last BM Date: 03/22/19  Weight change: Filed Weights   03/23/19 0140 03/24/19 0200 03/25/19 0521  Weight: 70.2 kg 75.7 kg 77.6 kg    Intake/Output:   Intake/Output Summary (Last 24 hours) at 03/25/2019 0837 Last data filed at 03/25/2019 0200 Gross per 24 hour  Intake 582 ml  Output 1025 ml  Net -443 ml      Physical Exam    General: NAD Neck: JVP 8 cm, no thyromegaly or thyroid nodule.  Lungs: Clear to auscultation bilaterally with normal respiratory effort. CV: Nondisplaced PMI.  Heart regular S1/S2, no S3/S4, no murmur.  No peripheral edema.   Abdomen: Soft, nontender, no hepatosplenomegaly, no distention.  Skin: Intact without lesions or rashes.  Neurologic: Alert and oriented x 3.  Psych: Normal affect. Extremities: No clubbing or cyanosis.  HEENT: Normal.    Telemetry   NSR 70s (personally reviewed)  Labs    CBC Recent Labs    03/22/19 1654 03/23/19 0824  WBC 5.8 5.5  NEUTROABS 4.3  --   HGB 9.6* 9.3*  HCT 31.4* 30.3*  MCV 87.2 87.1  PLT 290 220   Basic Metabolic Panel Recent Labs    03/24/19 1043 03/25/19 0424  NA 143 141  K 4.0 4.1  CL 106 106  CO2 27 28  GLUCOSE 150* 116*  BUN 36* 32*  CREATININE 1.49* 1.26*  CALCIUM 9.3 9.2   Liver Function Tests Recent Labs    03/22/19 1654 03/23/19 0824  AST 19 18  ALT 14 15  ALKPHOS 104  105  BILITOT 0.8 0.5  PROT 6.1* 6.1*  ALBUMIN 3.2* 3.1*   No results for input(s): LIPASE, AMYLASE in the last 72 hours. Cardiac Enzymes No results for input(s): CKTOTAL, CKMB, CKMBINDEX, TROPONINI in the last 72 hours.  BNP: BNP (last 3 results) Recent Labs    04/09/18 1749 02/13/19 1805 02/24/19 0325  BNP 297.8* 1,211.6* 1,344.9*    ProBNP (last 3 results) No results for input(s): PROBNP in the last 8760 hours.   D-Dimer No results for input(s): DDIMER in the last 72 hours. Hemoglobin A1C No results for input(s): HGBA1C in the last 72 hours. Fasting Lipid Panel No results for input(s): CHOL, HDL, LDLCALC, TRIG, CHOLHDL, LDLDIRECT in the last 72 hours. Thyroid Function Tests No results for input(s): TSH, T4TOTAL, T3FREE, THYROIDAB in the last 72 hours.  Invalid input(s): FREET3  Other results:   Imaging    No results found.   Medications:     Scheduled Medications: . atorvastatin  40 mg Oral Daily  . azelastine  1 spray Each Nare BID  . enoxaparin (LOVENOX) injection  40 mg Subcutaneous Q24H  . feeding supplement (PRO-STAT SUGAR FREE 64)  30 mL Oral BID  . gabapentin  300 mg Oral QHS  . guaiFENesin  600 mg Oral BID  . influenza vaccine adjuvanted  0.5 mL Intramuscular Once  . insulin aspart  0-5 Units Subcutaneous QHS  . insulin aspart  0-9 Units Subcutaneous TID WC  . insulin detemir  20 Units Subcutaneous QHS  . ipratropium  0.5 mg Nebulization TID  . levalbuterol  0.63 mg Nebulization TID  . macitentan  10 mg Oral Daily  . midodrine  7.5 mg Oral TID WC  . mometasone-formoterol  2 puff Inhalation BID  . Selexipag  600 mcg Oral BID  . sertraline  75 mg Oral Daily  . sodium chloride flush  3 mL Intravenous Q12H    Infusions: . sodium chloride      PRN Medications: sodium chloride, acetaminophen **OR** acetaminophen, albuterol, sodium chloride, sodium chloride flush, traMADol   Assessment/Plan   1. Orthostatic hypotension: She had been on  Lasix 60 mg bid at home.  RHC in 8/20 prior to discharge showed optimized filling pressures.  Weight down.  Suspect that she was mildly dehydrated.  Chronic RV failure likely plays a role as well.  - Can restart Lasix at lower dose today, 40 mg bid.   - Midodrine increased to 7.5 mg tid.  2. Chronic hypoxemic respiratory failure: She has been on 5L home oxygen at baseline. Past PFTs were restrictive, but last CT chest in 6/20 showed emphysema.  - Stable.  3. 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 Serologies negative. Restrictive PFTs but lung parenchyma looked relatively normal on 1/18 CTA chest. V/Q scan negative for chronic or acute PEs. Serologic workup was negative. RHC (1/19) showed moderate pulmonary arterial hypertension. Echo in 8/18 showed dilated and dysfunctional RV, similar in 11/18 and 9/19. Echo in 0/21 showed PA systolic pressure still very high at 81 mmHg. Sleep study showed nocturnal hypoxemia but not OSA. She wears home oxygen. She is currently on Opsumit and selexipag.Echo in 8/20 showed normal LV EF, moderate RV dilation/moderate dysfunction with D-shaped septum and PASP 80 mmHg, mild AS.  Repeat RHC in 8/20 showed optimized filling pressures with moderate PH, PVR only 3.2.  PH seems stable.  -Continue current dose of selexipag. Intolerant higher doses.  - Continue Opsumit.  - Given visual changes,have not hadher restart tadalafil.  4.Chronic diastolic CHF: With prominent RV failure.She is not volume overloaded on exam today.  Lasix has been on hold. - Can restart Lasix at 40 mg bid (lower dose) today.   5. CKD: Stage 3.Creatinine 1.26 today. 6. CAD: Nonobstructive in 2015. No exertional CP.  - Continue atorvastatin 40 mg daily.  7. R ankle Fracture, S/P ORIF 11/26/2018.Admitted in 8/20 for debridement, removalhardware,R ankle fusion. 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  - To be managed  conservatively with boot for now, per ortho.   We will see again Monday if she does not go home over the weekend.   Length of Stay: 1  [name], MD  03/25/2019, 8:37 AM  Advanced Heart Failure Team Pager [phone] (M-F; 7a - 4p)  Please contact [facility] for night-coverage after hours (4p -7a ) and weekends on [email or web address]

## 2019-03-25 NOTE — Plan of Care (Signed)
  Problem: Education: Goal: Knowledge of General Education information will improve Description: Including pain rating scale, medication(s)/side effects and non-pharmacologic comfort measures Outcome: Progressing   Problem: Health Behavior/Discharge Planning: Goal: Ability to manage health-related needs will improve Outcome: Progressing   Problem: Clinical Measurements: Goal: Ability to maintain clinical measurements within normal limits will improve Outcome: Progressing Goal: Will remain free from infection Outcome: Progressing Goal: Diagnostic test results will improve Outcome: Progressing Goal: Respiratory complications will improve Outcome: Progressing Goal: Cardiovascular complication will be avoided Outcome: Progressing   Problem: Activity: Goal: Risk for activity intolerance will decrease Outcome: Progressing   Problem: Elimination: Goal: Will not experience complications related to bowel motility Outcome: Progressing   Problem: Pain Managment: Goal: General experience of comfort will improve Outcome: Progressing   Problem: Safety: Goal: Ability to remain free from injury will improve Outcome: Progressing   Problem: Education: Goal: Ability to demonstrate management of disease process will improve Outcome: Progressing Goal: Ability to verbalize understanding of medication therapies will improve Outcome: Progressing Goal: Individualized Educational Video(s) Outcome: Progressing   Problem: Activity: Goal: Capacity to carry out activities will improve Outcome: Progressing   Problem: Cardiac: Goal: Ability to achieve and maintain adequate cardiopulmonary perfusion will improve Outcome: Progressing   

## 2019-03-25 NOTE — Significant Event (Signed)
Rapid Response Event Note  Overview: Time Called: 0958 Arrival Time: 1000 Event Type: Respiratory  Initial Focused Assessment: RN called because patient desat into mid 80s on 7L [place].  BP 115/56 HR 77  RR 18 Upon my arrival patient is fully alert oriented.   She states that she normally desats at home when she has just woken up or sometimes when she is active.  She states that it normally takes a few minutes to improve.  The lowest she has seen her O2 sat at home is in the 60s but didn't feel bad.  Interventions: Placed back on her normal 5L [place]  O2 sats improved to 95% Assisted to [facility] and then chair. Desat to 88% on 5L, patient asymptomatic Taught patient how to use IS, IS 1000 Dr Karleen Hampshire at bedside,  [place] to maintain O2 sats 88-92%     Plan of Care (if not transferred):  Event Summary: Name of Physician Notified: Akula at [phone]    at    Outcome: Stayed in room and stabalized  Event End Time: [place]  Raliegh Ip

## 2019-03-25 NOTE — Plan of Care (Signed)
  Problem: Nutrition: Goal: Adequate nutrition will be maintained Outcome: Completed/Met   Problem: Coping: Goal: Level of anxiety will decrease Outcome: Completed/Met   Problem: Elimination: Goal: Will not experience complications related to urinary retention Outcome: Completed/Met   Problem: Skin Integrity: Goal: Risk for impaired skin integrity will decrease Outcome: Completed/Met

## 2019-03-25 NOTE — Progress Notes (Signed)
Subjective: Doing well. Right foot pain controlled.  No complaints.  Waiting for brace.  [employer] came out yesterday and making brace now.    Objective: Vital signs in last 24 hours: Temp:  [97.8 F (36.6 C)-98.1 F (36.7 C)] 97.8 F (36.6 C) (09/11 0521) Pulse Rate:  [59-80] 77 (09/11 1033) Resp:  [16-18] 18 (09/11 0815) BP: (106-121)/(55-59) 110/55 (09/11 1033) SpO2:  [78 %-100 %] 90 % (09/11 1033) Weight:  [77.6 kg] 77.6 kg (09/11 0521)  Intake/Output from previous day: 09/10 0701 - 09/11 0700 In: 702 [P.O.:702] Out: 1025 [Urine:1025] Intake/Output this shift: Total I/O In: 120 [P.O.:120] Out: 300 [Urine:300]  Recent Labs    03/22/19 1654 03/23/19 0824  HGB 9.6* 9.3*   Recent Labs    03/22/19 1654 03/23/19 0824  WBC 5.8 5.5  RBC 3.60* 3.48*  HCT 31.4* 30.3*  PLT 290 288   Recent Labs    03/24/19 1043 03/25/19 0424  NA 143 141  K 4.0 4.1  CL 106 106  CO2 27 28  BUN 36* 32*  CREATININE 1.49* 1.26*  GLUCOSE 150* 116*  CALCIUM 9.3 9.2   No results for input(s): LABPT, INR in the last 72 hours.  Exam: Extremely pleasant elderly female, alert and oriented, NAD.  Boot on right foot.     Assessment/Plan: Continue present care.  D/c planning per admitting team.  Stable from ortho standpoint.  Waiting for double upright AFO.  Continue boot for now 50% PWB per Dr Louanne Skye.       Benjiman Core 03/25/2019, 12:32 PM

## 2019-03-26 LAB — GLUCOSE, CAPILLARY
Glucose-Capillary: 115 mg/dL — ABNORMAL HIGH (ref 70–99)
Glucose-Capillary: 153 mg/dL — ABNORMAL HIGH (ref 70–99)
Glucose-Capillary: 191 mg/dL — ABNORMAL HIGH (ref 70–99)
Glucose-Capillary: 210 mg/dL — ABNORMAL HIGH (ref 70–99)

## 2019-03-26 LAB — SARS CORONAVIRUS 2 (TAT 6-24 HRS): SARS Coronavirus 2: NEGATIVE

## 2019-03-26 LAB — BASIC METABOLIC PANEL
Anion gap: 8 (ref 5–15)
BUN: 29 mg/dL — ABNORMAL HIGH (ref 8–23)
CO2: 31 mmol/L (ref 22–32)
Calcium: 9.4 mg/dL (ref 8.9–10.3)
Chloride: 102 mmol/L (ref 98–111)
Creatinine, Ser: 1.27 mg/dL — ABNORMAL HIGH (ref 0.44–1.00)
GFR calc Af Amer: 49 mL/min — ABNORMAL LOW (ref >60)
GFR calc non Af Amer: 42 mL/min — ABNORMAL LOW (ref >60)
Glucose, Bld: 117 mg/dL — ABNORMAL HIGH (ref 70–99)
Potassium: 3.6 mmol/L (ref 3.5–5.1)
Sodium: 141 mmol/L (ref 135–145)

## 2019-03-26 MED ORDER — POTASSIUM CHLORIDE CRYS ER 20 MEQ PO TBCR
40.0000 meq | EXTENDED_RELEASE_TABLET | Freq: Once | ORAL | Status: AC
  Administered 2019-03-26: 09:00:00 40 meq via ORAL
  Filled 2019-03-26: qty 2

## 2019-03-26 NOTE — Progress Notes (Signed)
Patient has an order to be swabbed for Covid19. Lab called for the kit, waiting for them to send it.

## 2019-03-26 NOTE — TOC Progression Note (Signed)
Transition of Care [facility]) - Progression Note    Patient Details  Name: RENDA POHLMAN MRN: [id number] Date of Birth: 1946/04/15  Transition of Care [facility]) CM/SW [place], LCSW Phone Number:[phone] 03/26/2019, 2:40 PM  Clinical Narrative:     CSW met with patient bedside to give her bed offers. Patient chose [name]. CSW followed up with Abigail Butts at [facility] and she stated that she would start the authorization on Monday due to [facility] not doing it over the weekend. Facility stated that the authorization should come back the same day.  CSW will continue to follow for discharge planning.   Expected Discharge Plan: Skilled Nursing Facility Barriers to Discharge: Continued Medical Work up  Expected Discharge Plan and Services Expected Discharge Plan: [place] Choice: [place] arrangements for the past 2 months: Single Family Home Expected Discharge Date: 03/23/19                                     Social Determinants of Health (SDOH) Interventions    Readmission Risk Interventions Readmission Risk Prevention Plan 11/29/2018  Transportation Screening Complete  PCP or Specialist Appt within 3-5 Days Complete  HRI or [place] Complete  Social Work Consult for [place] Planning/Counseling Complete  Palliative Care Screening Not Applicable  Medication Review [employer] photographer) Complete  Some recent data might be hidden

## 2019-03-26 NOTE — Progress Notes (Signed)
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    Assessment & Plan:   Principal Problem:   Dizziness Active Problems:   Chronic obstructive pulmonary disease ([name])   Type 2 diabetes mellitus with complication, with long-term current use of insulin ([name])   HTN (hypertension)   RVF (right ventricular failure) ([place])   Pulmonary hypertension, unspecified ([name])   Orthostatic hypotension   Orthostatic hypotension and associated dizziness:  Probably secondary to dehydration. NO new complaints.  bp well controlled. On Midodrine 7.5 mg TID.    Hypertension:  Well controlled.  Continue with lasix 40 mg BID.   Chronic respiratory failure on 5 lit of [place] oxygen due to COPD and moderate pulmonary hypertension;  No wheezing today.  Currently back to 5 lit of [place] oxygen.  CXR unchanged from before. She has 98 to 100% sats on 5 lit of [place] oxygen.  Use IS.   Anemia of chronic disease:  Hemoglobin around 9 and stable.  No new labs ordered. She is waiting for a bed at SNF.    Stage 3 CKD:  Creatinine at baseline at 1.27.  Continue to monitor.   H/o lung cancer s/p segmentectomy: Recommend outpatient follow up with Dr [name].    Hyperlipidemia:  Resume with lipitor.    Pulmonary hypertension:  Continue with opsumit and Slexipag.  Echocardiogram last month revealed preserved LVEF and mod RV  dilation.  Diabetes Mellitus with neuropathy:  CBG (last 3)  Recent Labs    03/25/19  1652 03/25/19 2111 03/26/19 0611  GLUCAP 122* 174* 115*   Resume SSI.  Not well controlled. Add 2 units of novlog TIDAC.  Continue with levemir 20 units daily.  No changes in medications.    Chronic diastolic CHF:  Restarted lasix 40 mg BID by heart failure team.    H/o of CAD:  Non obstructive.  Pt denies any chest pain or sob.    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  CT showed loosening of the hardware due to charcot ankle. Orthopedics recommends patella tendon right double upright AFO brace for 50% PWB .  Outpatient follow up with orthopedics Dr Louanne Skye, recommended on discharge.    DVT prophylaxis: lovenox.  Code Status: full code.  Family Communication: none at bedside.  Disposition Plan: pending SNF placement. Awaiting for a bed. Discussed with CSW and a repeat covid  Is pending.    Consultants:   Orthopedics.   Cardiology.   Procedures: none.   Antimicrobials: none.   Subjective: Alert and comfortable, denies any chest pain or sob.  No nausea or vomiting or abdominal pain.   Objective: Vitals:   03/25/19 1937 03/25/19 2025 03/25/19 2028 03/26/19 0508  BP: (!) 109/53   125/73  Pulse: 75   77  Resp: 20   20  Temp: 98.3 F (36.8 C)   98.4 F (36.9 C)  TempSrc: Oral   Oral  SpO2:  90% 90% 92% 98%  Weight:    73.9 kg  Height:        Intake/Output Summary (Last 24 hours) at 03/26/2019 0947 Last data filed at 03/26/2019 0900 Gross per 24 hour  Intake 1080 ml  Output 1600 ml  Net -520 ml   Filed Weights   03/24/19 0200 03/25/19 0521 03/26/19 0508  Weight: 75.7 kg 77.6 kg 73.9 kg    Examination:  General exam: alert and comfortable. On 5 lit of [place] oxygen.  Respiratory system: air entry fair, no wheezing or rhonchi.  Cardiovascular system: s1s2, RRR, no JVD.  Gastrointestinal system: Abdomen is soft, non tender , non distended bowel  sounds good.  Central nervous system: alert and oriented, non focal.  Extremities: no pedal edema.  Skin: no ulcers.  Psychiatry: mood appropriate.     Data Reviewed: I have personally reviewed following labs and imaging studies  CBC: Recent Labs  Lab 03/22/19 1654 03/23/19 0824  WBC 5.8 5.5  NEUTROABS 4.3  --   HGB 9.6* 9.3*  HCT 31.4* 30.3*  MCV 87.2 87.1  PLT 290 202   Basic Metabolic Panel: Recent Labs  Lab 03/22/19 1654 03/23/19 0824 03/24/19 1043 03/25/19 0424 03/26/19 0647  NA 141 141 143 141 141  K 3.1* 3.7 4.0 4.1 3.6  CL 102 102 106 106 102  CO2 _0 GLUCOSE 132* 142* 150* 116* 117*  BUN 32* 32* 36* 32* 29*  CREATININE 1.35* 1.51* 1.49* 1.26* 1.27*  CALCIUM 8.8* 9.2 9.3 9.2 9.4   GFR: Estimated Creatinine Clearance: 36.8 mL/min (A) (by C-G formula based on SCr of 1.27 mg/dL (H)). Liver Function Tests: Recent Labs  Lab 03/22/19 1654 03/23/19 0824  AST 19 18  ALT 14 15  ALKPHOS 104 105  BILITOT 0.8 0.5  PROT 6.1* 6.1*  ALBUMIN 3.2* 3.1*   No results for input(s): LIPASE, AMYLASE in the last 168 hours. No results for input(s): AMMONIA in the last 168 hours. Coagulation Profile: No results for input(s): INR, PROTIME in the last 168 hours. Cardiac Enzymes: No results for input(s): CKTOTAL, CKMB, CKMBINDEX, TROPONINI in the last 168 hours. BNP (last 3 results) No results for input(s): PROBNP in the last 8760 hours. HbA1C: No results for input(s): HGBA1C in the last 72 hours. CBG: Recent Labs  Lab 03/25/19 0527 03/25/19 1151 03/25/19 1652 03/25/19 2111 03/26/19 0611  GLUCAP 105* 167* 122* 174* 115*   Lipid Profile: No results for input(s): CHOL, HDL, LDLCALC, TRIG, CHOLHDL, LDLDIRECT in the last 72 hours. Thyroid Function Tests: No results for input(s): TSH, T4TOTAL, FREET4, T3FREE, THYROIDAB in the last 72 hours. Anemia Panel: No results for input(s): VITAMINB12, FOLATE, FERRITIN, TIBC, IRON, RETICCTPCT in the last 72 hours.  Sepsis Labs: Recent Labs  Lab 03/22/19 1654 03/23/19 0232  LATICACIDVEN 0.6 0.9    Recent Results (from the past 240 hour(s))  Urine Culture     Status: Abnormal   Collection Time: 03/22/19  6:11 PM   Specimen: Urine, Random  Result Value Ref Range Status   Specimen Description URINE, RANDOM  Final   Special Requests NONE  Final   Culture (A)  Final    30,000 COLONIES/mL GROUP B STREP(S.AGALACTIAE)ISOLATED TESTING AGAINST S. AGALACTIAE NOT ROUTINELY PERFORMED DUE TO PREDICTABILITY OF AMP/PEN/VAN SUSCEPTIBILITY. Performed at [facility], [place] [address], [place],  [address]    Report Status 03/23/2019 FINAL  Final  SARS Coronavirus 2 [facility] order, Performed in [facility] hospital lab)  Nasopharyngeal Nasopharyngeal Swab     Status: None   Collection Time: 03/22/19  9:27 PM   Specimen: Nasopharyngeal Swab  Result Value Ref Range Status   SARS Coronavirus 2 NEGATIVE NEGATIVE Final    Comment: (NOTE) If result is NEGATIVE SARS-CoV-2 target nucleic acids are NOT DETECTED. The SARS-CoV-2 RNA is generally detectable in upper and lower  respiratory specimens during the acute phase of infection. The lowest  concentration of SARS-CoV-2 viral copies this assay can detect is 250  copies / mL. A negative result does not preclude SARS-CoV-2 infection  and should not be used as the sole basis for treatment or other  patient management decisions.  A negative result may occur with  improper specimen collection / handling, submission of specimen other  than nasopharyngeal swab, presence of viral mutation(s) within the  areas targeted by this assay, and inadequate number of viral copies  (<250 copies / mL). A negative result must be combined with clinical  observations, patient history, and epidemiological information. If result is POSITIVE SARS-CoV-2 target nucleic acids are DETECTED. The SARS-CoV-2 RNA is generally detectable in upper and lower  respiratory specimens dur  ing the acute phase of infection.  Positive  results are indicative of active infection with SARS-CoV-2.  Clinical  correlation with patient history and other diagnostic information is  necessary to determine patient infection status.  Positive results do  not rule out bacterial infection or co-infection with other viruses. If result is PRESUMPTIVE POSTIVE SARS-CoV-2 nucleic acids MAY BE PRESENT.   A presumptive positive result was obtained on the submitted specimen  and confirmed on repeat testing.  While 2019 novel coronavirus  (SARS-CoV-2) nucleic acids may be present in the submitted sample  additional confirmatory testing may be necessary for epidemiological  and / or clinical management purposes  to differentiate between  SARS-CoV-2 and other Sarbecovirus currently known to infect humans.  If clinically indicated additional testing with an alternate test  methodology [phone]) is advised. The SARS-CoV-2 RNA is generally  detectable in upper and lower respiratory sp ecimens during the acute  phase of infection. The expected result is Negative. Fact Sheet for Patients:  StrictlyIdeas.no Fact Sheet for Healthcare Providers: BankingDealers.co.za This test is not yet approved or cleared by the [place] FDA and has been authorized for detection and/or diagnosis of SARS-CoV-2 by FDA under an Emergency Use Authorization (EUA).  This EUA will remain in effect (meaning this test can be used) for the duration of the COVID-19 declaration under Section 564(b)(1) of the Act, 21 U.S.C. section 360bbb-3(b)(1), unless the authorization is terminated or revoked sooner. Performed at [facility], [place] [address] [place], [place] [address]          Radiology Studies: Dg Chest 2 View  Result Date: 03/25/2019 CLINICAL DATA:  Shortness of breath, history of CHF EXAM: CHEST - 2 VIEW COMPARISON:  03/22/2019 FINDINGS: Cardiomegaly.  Pulmonary vascular prominence without overt edema. There is bandlike scarring or atelectasis of the right mid lung with associated surgical clips. Disc degenerative disease of the thoracic spine. IMPRESSION: No significant interval change in chest radiographs. Cardiomegaly and pulmonary vascular prominence without overt edema. Bandlike scarring or atelectasis of the right mid lung. Electronically Signed   By: Eddie Candle M.D.   On: 03/25/2019 13:27        Scheduled Meds: . atorvastatin  40 mg Oral Daily  . azelastine  1 spray Each Nare BID  . enoxaparin (LOVENOX) injection  40 mg Subcutaneous  Q24H  . feeding supplement (PRO-STAT SUGAR FREE 64)  30 mL Oral BID  . furosemide  40 mg Oral BID  . gabapentin  300 mg Oral QHS  . guaiFENesin  600 mg Oral BID  . influenza vaccine adjuvanted  0.5 mL Intramuscular Once  . insulin aspart  0-5 Units Subcutaneous QHS  . insulin aspart  0-9 Units Subcutaneous TID WC  . insulin detemir  20 Units Subcutaneous QHS  . ipratropium  0.5 mg Nebulization TID  . levalbuterol  0.63 mg Nebulization TID  . macitentan  10 mg Oral Daily  . midodrine  7.5 mg Oral TID WC  . mometasone-formoterol  2 puff Inhalation BID  . Selexipag  600 mcg Oral BID  . sertraline  75 mg Oral Daily  . sodium chloride flush  3 mL Intravenous Q12H   Continuous Infusions: . sodium chloride       LOS: 2 days    Time spent: 28 MINUTES.     Hosie Poisson, MD [facility] Pager [phone] If 7PM-7AM, please contact night-coverage [email or web address] Password [facility] 03/26/2019, 9:47 AM

## 2019-03-26 NOTE — Progress Notes (Signed)
RT to room for scheduled breathing tx's. Pt laying on L side and states she has just gotten comfortable in bed. Asked pt if she felt she needed her tx's at this time, pt denies any SOB, states she doesn't really feel like she needs it. Advised pt to notify for RT if she decided later that she wants her tx. RT will continue to monitor

## 2019-03-27 DIAGNOSIS — L03115 Cellulitis of right lower limb: Secondary | ICD-10-CM

## 2019-03-27 LAB — GLUCOSE, CAPILLARY
Glucose-Capillary: 153 mg/dL — ABNORMAL HIGH (ref 70–99)
Glucose-Capillary: 139 mg/dL — ABNORMAL HIGH (ref 70–99)
Glucose-Capillary: 157 mg/dL — ABNORMAL HIGH (ref 70–99)
Glucose-Capillary: 204 mg/dL — ABNORMAL HIGH (ref 70–99)

## 2019-03-27 LAB — BASIC METABOLIC PANEL
Anion gap: 11 (ref 5–15)
BUN: 38 mg/dL — ABNORMAL HIGH (ref 8–23)
CO2: 29 mmol/L (ref 22–32)
Calcium: 9.3 mg/dL (ref 8.9–10.3)
Chloride: 100 mmol/L (ref 98–111)
Creatinine, Ser: 1.16 mg/dL — ABNORMAL HIGH (ref 0.44–1.00)
GFR calc Af Amer: 54 mL/min — ABNORMAL LOW (ref >60)
GFR calc non Af Amer: 47 mL/min — ABNORMAL LOW (ref >60)
Glucose, Bld: 148 mg/dL — ABNORMAL HIGH (ref 70–99)
Potassium: 3.6 mmol/L (ref 3.5–5.1)
Sodium: 140 mmol/L (ref 135–145)

## 2019-03-27 MED ORDER — INSULIN ASPART 100 UNIT/ML ~~LOC~~ SOLN
0.0000 [IU] | Freq: Three times a day (TID) | SUBCUTANEOUS | Status: DC
  Administered 2019-03-27 – 2019-03-28 (×4): 3 [IU] via SUBCUTANEOUS
  Administered 2019-03-29: 5 [IU] via SUBCUTANEOUS
  Administered 2019-03-29: 12:00:00 8 [IU] via SUBCUTANEOUS

## 2019-03-27 NOTE — Progress Notes (Signed)
PROGRESS NOTE    Amy Shepard  VXB:[id number] DOB: February 21, 1946 DOA: 03/22/2019 PCP: Maurice Small, MD   Brief Narrative:  73 year old lady with prior h/o hypertension, hyperlipidemia, DM, stage 3 CKD, CAD, diastolic heart failure, mild aortic stenosis, lung cancer s/p right lower lung superior segmentectomy , COPD on 5 lit of [place] oxygen at home, pulmonary hypertension, h/o orthostatic hypotension, recent admission for right ankle fusion on 02/11/2019 due to charcot collapse, non union of right ankle fracture presents with dizziness since 5 days.    Assessment & Plan:   Principal Problem:   Dizziness Active Problems:   Chronic obstructive pulmonary disease ([name])   Type 2 diabetes mellitus with complication, with long-term current use of insulin ([name])   HTN (hypertension)   RVF (right ventricular failure) ([place])   Pulmonary hypertension, unspecified ([name])   Orthostatic hypotension   Orthostatic hypotension and associated dizziness:  Probably secondary to dehydration.  One episode of blood pressure 96/53, repeat blood pressure was 116/56.   On Midodrine 7.5 mg TID.  No dizziness or new complaints   Hypertension:  Better controlled.  Continue with lasix 40 mg BID.   Chronic respiratory failure on 5 lit of [place] oxygen due to COPD and moderate pulmonary hypertension;  No wheezing today.  Currently back to 5 lit of [place] oxygen.  CXR unchanged from before.  Good sats on 5 L of nasal cannula oxygen and encouraged the patient to use incentive spirometry.  Anemia of chronic disease:  Hemoglobin around 9 and stable.  No new labs ordered. She is waiting for a bed at SNF.    Stage 3 CKD:   Improving at 1.16.,  Bicarb within normal limits. Continue to monitor.   H/o lung cancer s/p segmentectomy: Recommend outpatient follow up with Dr [name].    Hyperlipidemia:  Resume with lipitor.    Pulmonary hypertension:  Continue with opsumit and Slexipag.  Echocardiogram last month  revealed preserved LVEF and mod RV  dilation.  Diabetes Mellitus with neuropathy:  CBG (last 3)  Recent Labs    03/26/19 2044 03/27/19 0559 03/27/19 1204  GLUCAP 210* 153* 139*   Resume SSI.  Suboptimally controlled CBGs change sliding scale to moderate SSI. Continue with levemir 20 units daily.  .    Chronic diastolic CHF:  Restarted lasix 40 mg BID by heart failure team.  diuresed about 1.4 lit in the last 24 hours.  Creatinine stable and improving.  Appreciate HF team follow up.    H/o of CAD:  Non obstructive.  Pt denies any chest pain or sob.    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  CT showed loosening of the hardware due to charcot ankle. Orthopedics recommends patella tendon right double upright AFO brace for 50% PWB .  Outpatient follow up with orthopedics Dr Louanne Skye, recommended on discharge.    No new changes. Pt not using boot when in bed.   DVT prophylaxis: lovenox.  Code Status: full code.  Family Communication: none at bedside.  Disposition Plan: pending SNF placement. Awaiting for a bed. Discussed with CSW and a repeat covid  Is pending.    Consultants:   Orthopedics.   Cardiology.   Procedures: none.   Antimicrobials: none.   Subjective: No new complaints. No chest pain or sob.   Objective: Vitals:   03/27/19 0513 03/27/19 0725 03/27/19 1207 03/27/19 1312  BP:   [name])  116/56   Pulse:  78 75   Resp:  18 18   Temp:   97.7 F (36.5 C)   TempSrc:   Oral   SpO2:  92% 92% 96%  Weight: 73.1 kg     Height:        Intake/Output Summary (Last 24 hours) at 03/27/2019 1510 Last data filed at 03/27/2019 1300 Gross per 24 hour  Intake 960 ml  Output 600 ml  Net 360 ml   Filed Weights   03/25/19 0521 03/26/19 0508 03/27/19 0513  Weight: 77.6 kg 73.9 kg 73.1 kg    Examination:  General exam: not in distress on 5 lit of [place]  oxygen.  Respiratory system:  Diminished at bases, no wheezing or rhonchi.  Cardiovascular system:  S1s2, RRR, no pedal edema.  Gastrointestinal system: Abdomen is soft, non tender , non distended bowel sounds heard.  Central nervous system: alert and oriented , non focal  Extremities: No pedal edema.  Skin: no ulcers.or lesions.  Psychiatry: mood appropriate.     Data Reviewed: I have personally reviewed following labs and imaging studies  CBC: Recent Labs  Lab 03/22/19 1654 03/23/19 0824  WBC 5.8 5.5  NEUTROABS 4.3  --   HGB 9.6* 9.3*  HCT 31.4* 30.3*  MCV 87.2 87.1  PLT 290 173   Basic Metabolic Panel: Recent Labs  Lab 03/23/19 0824 03/24/19 1043 03/25/19 0424 03/26/19 0647 03/27/19 0522  NA 141 143 141 141 140  K 3.7 4.0 4.1 3.6 3.6  CL 102 106 106 102 100  CO2 _0 GLUCOSE 142* 150* 116* 117* 148*  BUN 32* 36* 32* 29* 38*  CREATININE 1.51* 1.49* 1.26* 1.27* 1.16*  CALCIUM 9.2 9.3 9.2 9.4 9.3   GFR: Estimated Creatinine Clearance: 40.1 mL/min (A) (by C-G formula based on SCr of 1.16 mg/dL (H)). Liver Function Tests: Recent Labs  Lab 03/22/19 1654 03/23/19 0824  AST 19 18  ALT 14 15  ALKPHOS 104 105  BILITOT 0.8 0.5  PROT 6.1* 6.1*  ALBUMIN 3.2* 3.1*   No results for input(s): LIPASE, AMYLASE in the last 168 hours. No results for input(s): AMMONIA in the last 168 hours. Coagulation Profile: No results for input(s): INR, PROTIME in the last 168 hours. Cardiac Enzymes: No results for input(s): CKTOTAL, CKMB, CKMBINDEX, TROPONINI in the last 168 hours. BNP (last 3 results) No results for input(s): PROBNP in the last 8760 hours. HbA1C: No results for input(s): HGBA1C in the last 72 hours. CBG: Recent Labs  Lab 03/26/19 1117 03/26/19 1630 03/26/19 2044 03/27/19 0559 03/27/19 1204  GLUCAP 153* 191* 210* 153* 139*   Lipid Profile: No results for input(s): CHOL, HDL, LDLCALC, TRIG, CHOLHDL, LDLDIRECT in the last 72 hours. Thyroid  Function Tests: No results for input(s): TSH, T4TOTAL, FREET4, T3FREE, THYROIDAB in the last 72 hours. Anemia Panel: No results for input(s): VITAMINB12, FOLATE, FERRITIN, TIBC, IRON, RETICCTPCT in the last 72 hours. Sepsis Labs: Recent Labs  Lab 03/22/19 1654 03/23/19 0232  LATICACIDVEN 0.6 0.9    Recent Results (from the past 240 hour(s))  Urine Culture     Status: Abnormal   Collection Time: 03/22/19  6:11 PM   Specimen: Urine, Random  Result Value Ref Range Status   Specimen Description URINE, RANDOM  Final   Special Requests NONE  Final   Culture (A)  Final    30,000 COLONIES/mL GROUP B STREP(S.AGALACTIAE)ISOLATED TESTING AGAINST S. AGALACTIAE NOT ROUTINELY PERFORMED DUE TO PREDICTABILITY OF  AMP/PEN/VAN SUSCEPTIBILITY. Performed at [facility], [place] [address], [place], [place] [address]    Report Status 03/23/2019 FINAL  Final  SARS Coronavirus 2 [facility] order, Performed in [facility] hospital lab) Nasopharyngeal Nasopharyngeal Swab     Status: None   Collection Time: 03/22/19  9:27 PM   Specimen: Nasopharyngeal Swab  Result Value Ref Range Status   SARS Coronavirus 2 NEGATIVE NEGATIVE Final    Comment: (NOTE) If result is NEGATIVE SARS-CoV-2 target nucleic acids are NOT DETECTED. The SARS-CoV-2 RNA is generally detectable in upper and lower  respiratory specimens during the acute phase of infection. The lowest  concentration of SARS-CoV-2 viral copies this assay can detect is 250  copies / mL. A negative result does not preclude SARS-CoV-2 infection  and should not be used as the sole basis for treatment or other  patient management decisions.  A negative result may occur with  improper specimen collection / handling, submission of specimen other  than nasopharyngeal swab, presence of viral mutation(s) within the  areas targeted by this assay, and inadequate number of viral copies  (<250 copies / mL). A negative result must be combined with clinical   observations, patient history, and epidemiological information. If result is POSITIVE SARS-CoV-2 target nucleic acids are DETECTED. The SARS-CoV-2 RNA is generally detectable in upper and lower  respiratory specimens dur ing the acute phase of infection.  Positive  results are indicative of active infection with SARS-CoV-2.  Clinical  correlation with patient history and other diagnostic information is  necessary to determine patient infection status.  Positive results do  not rule out bacterial infection or co-infection with other viruses. If result is PRESUMPTIVE POSTIVE SARS-CoV-2 nucleic acids MAY BE PRESENT.   A presumptive positive result was obtained on the submitted specimen  and confirmed on repeat testing.  While 2019 novel coronavirus  (SARS-CoV-2) nucleic acids may be present in the submitted sample  additional confirmatory testing may be necessary for epidemiological  and / or clinical management purposes  to differentiate between  SARS-CoV-2 and other Sarbecovirus currently known to infect humans.  If clinically indicated additional testing with an alternate test  methodology [phone]) is advised. The SARS-CoV-2 RNA is generally  detectable in upper and lower respiratory sp ecimens during the acute  phase of infection. The expected result is Negative. Fact Sheet for Patients:  StrictlyIdeas.no Fact Sheet for Healthcare Providers: BankingDealers.co.za This test is not yet approved or cleared by the [place] FDA and has been authorized for detection and/or diagnosis of SARS-CoV-2 by FDA under an Emergency Use Authorization (EUA).  This EUA will remain in effect (meaning this test can be used) for the duration of the COVID-19 declaration under Section 564(b)(1) of the Act, 21 U.S.C. section 360bbb-3(b)(1), unless the authorization is terminated or revoked sooner. Performed at [facility], [place] [address],  [place], [place] [address]   SARS CORONAVIRUS 2 (TAT 6-24 HRS) Nasopharyngeal Nasopharyngeal Swab     Status: None   Collection Time: 03/26/19  2:46 PM   Specimen: Nasopharyngeal Swab  Result Value Ref Range Status   SARS Coronavirus 2 NEGATIVE NEGATIVE Final    Comment: (NOTE) SARS-CoV-2 target nucleic acids are NOT DETECTED. The SARS-CoV-2 RNA is generally detectable in upper and lower respiratory specimens during the acute phase of infection. Negative results do not preclude SARS-CoV-2 infection, do not rule out co-infections with other pathogens, and should not be used as the sole basis for treatment or other patient  management decisions. Negative results must be combined with clinical observations, patient history, and epidemiological information. The expected result is Negative. Fact Sheet for Patients: SugarRoll.be Fact Sheet for Healthcare Providers: https://www.woods-mathews.com/ This test is not yet approved or cleared by the [place] FDA and  has been authorized for detection and/or diagnosis of SARS-CoV-2 by FDA under an Emergency Use Authorization (EUA). This EUA will remain  in effect (meaning this test can be used) for the duration of the COVID-19 declaration under Section 56 4(b)(1) of the Act, 21 U.S.C. section 360bbb-3(b)(1), unless the authorization is terminated or revoked sooner. Performed at [facility], [place] [address], [place], [place] [address]          Radiology Studies: No results found.      Scheduled Meds: . atorvastatin  40 mg Oral Daily  . azelastine  1 spray Each Nare BID  . enoxaparin (LOVENOX) injection  40 mg Subcutaneous Q24H  . feeding supplement (PRO-STAT SUGAR FREE 64)  30 mL Oral BID  . furosemide  40 mg Oral BID  . gabapentin  300 mg Oral QHS  . guaiFENesin  600 mg Oral BID  . influenza vaccine adjuvanted  0.5 mL Intramuscular Once  . insulin aspart  0-5 Units Subcutaneous QHS   . insulin aspart  0-9 Units Subcutaneous TID WC  . insulin detemir  20 Units Subcutaneous QHS  . ipratropium  0.5 mg Nebulization TID  . levalbuterol  0.63 mg Nebulization TID  . macitentan  10 mg Oral Daily  . midodrine  7.5 mg Oral TID WC  . mometasone-formoterol  2 puff Inhalation BID  . Selexipag  600 mcg Oral BID  . sertraline  75 mg Oral Daily  . sodium chloride flush  3 mL Intravenous Q12H   Continuous Infusions: . sodium chloride       LOS: 3 days    Time spent: 25 MINUTES.     Hosie Poisson, MD [facility] Pager [phone] If 7PM-7AM, please contact night-coverage [email or web address] Password [email or web address] 03/27/2019, 3:10 PM

## 2019-03-27 NOTE — Plan of Care (Signed)
  Problem: Education: Goal: Knowledge of General Education information will improve Description: Including pain rating scale, medication(s)/side effects and non-pharmacologic comfort measures Outcome: Progressing   Problem: Pain Managment: Goal: General experience of comfort will improve Outcome: Progressing   Problem: Education: Goal: Ability to verbalize understanding of medication therapies will improve Outcome: Progressing

## 2019-03-28 ENCOUNTER — Inpatient Hospital Stay: Payer: Medicare Other | Admitting: Orthopedic Surgery

## 2019-03-28 LAB — GLUCOSE, CAPILLARY
Glucose-Capillary: 169 mg/dL — ABNORMAL HIGH (ref 70–99)
Glucose-Capillary: 152 mg/dL — ABNORMAL HIGH (ref 70–99)
Glucose-Capillary: 153 mg/dL — ABNORMAL HIGH (ref 70–99)
Glucose-Capillary: 156 mg/dL — ABNORMAL HIGH (ref 70–99)

## 2019-03-28 LAB — BASIC METABOLIC PANEL
Anion gap: 9 (ref 5–15)
BUN: 43 mg/dL — ABNORMAL HIGH (ref 8–23)
CO2: 30 mmol/L (ref 22–32)
Calcium: 8.9 mg/dL (ref 8.9–10.3)
Chloride: 100 mmol/L (ref 98–111)
Creatinine, Ser: 1.18 mg/dL — ABNORMAL HIGH (ref 0.44–1.00)
GFR calc Af Amer: 53 mL/min — ABNORMAL LOW (ref >60)
GFR calc non Af Amer: 46 mL/min — ABNORMAL LOW (ref >60)
Glucose, Bld: 172 mg/dL — ABNORMAL HIGH (ref 70–99)
Potassium: 3.4 mmol/L — ABNORMAL LOW (ref 3.5–5.1)
Sodium: 139 mmol/L (ref 135–145)

## 2019-03-28 MED ORDER — POTASSIUM CHLORIDE CRYS ER 20 MEQ PO TBCR
20.0000 meq | EXTENDED_RELEASE_TABLET | Freq: Every day | ORAL | Status: DC
  Administered 2019-03-28 – 2019-03-29 (×2): 20 meq via ORAL
  Filled 2019-03-28 (×2): qty 1

## 2019-03-28 NOTE — TOC Progression Note (Addendum)
Transition of Care [facility]) - Progression Note    Patient Details  Name: Amy Shepard MRN: [id number] Date of Birth: [birth year]  Transition of Care [facility]) CM/SW [place], [place] Phone Number: [phone] 03/28/2019, 1:06 PM  Clinical Narrative:     Update: [facility] reports patient unable to make payments towards balance due after conversation with her, they are denying accepting patient at this time due to outstanding balance. They report patient will need to find a different SNF, CSW followed up with more bed offers, patient agreeable to go to [facility]. CSW reached out to [facility] for them to start insurance [place].  CSW was contacted by [facility] stating that patient owes them money and would need to reach out to [place] [facility] [place] with their business center to come up with payment resolution or payment plan prior to them accepting patient.   CSW met with patient at bedside to discuss this, she reports she knows she owes them money and will call them to resolve. CSW provided [facility] main number to patient and informed her to ask for Abigail Butts. Patient reports she will resolve this today.   Expected Discharge Plan: Skilled Nursing Facility Barriers to Discharge: Continued Medical Work up  Expected Discharge Plan and Services Expected Discharge Plan: [place] Choice: [place] arrangements for the past 2 months: Single Family Home Expected Discharge Date: 03/23/19                                     Social Determinants of Health (SDOH) Interventions    Readmission Risk Interventions Readmission Risk Prevention Plan 11/29/2018  Transportation Screening Complete  PCP or Specialist Appt within 3-5 Days Complete  HRI or [place] Complete  Social Work Consult for [place] Planning/Counseling Complete  Palliative Care Screening Not Applicable  Medication Review Human resources officer) Complete  Some recent data might be hidden

## 2019-03-28 NOTE — Care Management Important Message (Signed)
Important Message  Patient Details  Name: Amy Shepard MRN: [id number] Date of Birth: Jul 17, 1945   Medicare Important Message Given:  Yes     [name] 03/28/2019, 1:36 PM

## 2019-03-28 NOTE — Progress Notes (Signed)
Patient ID: Amy Shepard, female   DOB: June 30, 1946, 73 y.o.   MRN: [id number]     Advanced Heart Failure Rounding Note  PCP-Cardiologist: No primary care provider on file.   Subjective:    No complaints this morning.  No dizziness, not short of breath.    Objective:   Weight Range: 71.7 kg Body mass index is 29.85 kg/m.   Vital Signs:   Temp:  [97.7 F (36.5 C)-98.1 F (36.7 C)] 98.1 F (36.7 C) (09/14 0628) Pulse Rate:  [75-81] 81 (09/14 0628) Resp:  [18-20] 18 (09/14 0628) BP: (110-116)/(48-58) 110/58 (09/14 0628) SpO2:  [82 %-96 %] 87 % (09/14 0717) Weight:  [71.7 kg] 71.7 kg (09/14 0628) Last BM Date: 03/26/19  Weight change: Filed Weights   03/26/19 0508 03/27/19 0513 03/28/19 0628  Weight: 73.9 kg 73.1 kg 71.7 kg    Intake/Output:   Intake/Output Summary (Last 24 hours) at 03/28/2019 0918 Last data filed at 03/28/2019 0849 Gross per 24 hour  Intake 1182 ml  Output 1700 ml  Net -518 ml      Physical Exam    General: NAD Neck: No JVD, no thyromegaly or thyroid nodule.  Lungs: Mildly distant BS. CV: Nondisplaced PMI.  Heart regular S1/S2, no S3/S4, no murmur.  No peripheral edema.   Abdomen: Soft, nontender, no hepatosplenomegaly, no distention.  Skin: Intact without lesions or rashes.  Neurologic: Alert and oriented x 3.  Psych: Normal affect. Extremities: No clubbing or cyanosis. Right Charcot foot.  HEENT: Normal.    Telemetry   NSR 70s (personally reviewed)  Labs    CBC No results for input(s): WBC, NEUTROABS, HGB, HCT, MCV, PLT in the last 72 hours. Basic Metabolic Panel Recent Labs    03/27/19 0522 03/28/19 0653  NA 140 139  K 3.6 3.4*  CL 100 100  CO2 29 30  GLUCOSE 148* 172*  BUN 38* 43*  CREATININE 1.16* 1.18*  CALCIUM 9.3 8.9   Liver Function Tests No results for input(s): AST, ALT, ALKPHOS, BILITOT, PROT, ALBUMIN in the last 72 hours. No results for input(s): LIPASE, AMYLASE in the last 72 hours. Cardiac Enzymes  No results for input(s): CKTOTAL, CKMB, CKMBINDEX, TROPONINI in the last 72 hours.  BNP: BNP (last 3 results) Recent Labs    04/09/18 1749 02/13/19 1805 02/24/19 0325  BNP 297.8* 1,211.6* 1,344.9*    ProBNP (last 3 results) No results for input(s): PROBNP in the last 8760 hours.   D-Dimer No results for input(s): DDIMER in the last 72 hours. Hemoglobin A1C No results for input(s): HGBA1C in the last 72 hours. Fasting Lipid Panel No results for input(s): CHOL, HDL, LDLCALC, TRIG, CHOLHDL, LDLDIRECT in the last 72 hours. Thyroid Function Tests No results for input(s): TSH, T4TOTAL, T3FREE, THYROIDAB in the last 72 hours.  Invalid input(s): FREET3  Other results:   Imaging    No results found.   Medications:     Scheduled Medications: . atorvastatin  40 mg Oral Daily  . azelastine  1 spray Each Nare BID  . enoxaparin (LOVENOX) injection  40 mg Subcutaneous Q24H  . feeding supplement (PRO-STAT SUGAR FREE 64)  30 mL Oral BID  . furosemide  40 mg Oral BID  . gabapentin  300 mg Oral QHS  . guaiFENesin  600 mg Oral BID  . influenza vaccine adjuvanted  0.5 mL Intramuscular Once  . insulin aspart  0-15 Units Subcutaneous TID WC  . insulin aspart  0-5 Units Subcutaneous QHS  .  insulin detemir  20 Units Subcutaneous QHS  . ipratropium  0.5 mg Nebulization TID  . levalbuterol  0.63 mg Nebulization TID  . macitentan  10 mg Oral Daily  . midodrine  7.5 mg Oral TID WC  . mometasone-formoterol  2 puff Inhalation BID  . Selexipag  600 mcg Oral BID  . sertraline  75 mg Oral Daily  . sodium chloride flush  3 mL Intravenous Q12H    Infusions: . sodium chloride      PRN Medications: sodium chloride, acetaminophen **OR** acetaminophen, albuterol, sodium chloride, sodium chloride flush, traMADol   Assessment/Plan   1. Orthostatic hypotension: She had been on Lasix 60 mg bid at home.  RHC in 8/20 prior to discharge showed optimized filling pressures.  Weight down.   Suspect that she was mildly dehydrated.  Chronic RV failure likely plays a role as well.  Lightheadedness resolved.  - Continue lower Lasix dose, 40 mg bid.    - Midodrine increased to 7.5 mg tid.  2. Chronic hypoxemic respiratory failure: She has been on 5L home oxygen at baseline. Past PFTs were restrictive, but last CT chest in 6/20 showed emphysema.  - Stable.  3. 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 Serologies negative. Restrictive PFTs but lung parenchyma looked relatively normal on 1/18 CTA chest. V/Q scan negative for chronic or acute PEs. Serologic workup was negative. RHC (1/19) showed moderate pulmonary arterial hypertension. Echo in 8/18 showed dilated and dysfunctional RV, similar in 11/18 and 9/19. Echo in 1/61 showed PA systolic pressure still very high at 81 mmHg. Sleep study showed nocturnal hypoxemia but not OSA. She wears home oxygen. She is currently on Opsumit and selexipag.Echo in 8/20 showed normal LV EF, moderate RV dilation/moderate dysfunction with D-shaped septum and PASP 80 mmHg, mild AS.  Repeat RHC in 8/20 showed optimized filling pressures with moderate PH, PVR only 3.2.  PH seems stable.  -Continue current dose of selexipag. Intolerant higher doses.  - Continue Opsumit.  - Given visual changes,have not hadher restart tadalafil.  4.Chronic diastolic CHF: With prominent RV failure.She is not volume overloaded on exam today.   - Continue Lasix 40 mg bid (lower dose).   5. CKD: Stage 3.Creatinine 1.2 today. 6. CAD: Nonobstructive in 2015. No exertional CP.  - Continue atorvastatin 40 mg daily.  7. R ankle Fracture, S/P ORIF 11/26/2018.Admitted in 8/20 for debridement, removalhardware,R ankle fusion. 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  - To be managed conservatively with boot for now, per ortho.   She is waiting for SNF.  We will follow from a distance.  Will arrange followup.    Length of Stay: 4  [name], MD  03/28/2019, 9:18 AM  Advanced Heart Failure Team Pager [phone] (M-F; 7a - 4p)  Please contact [facility] for night-coverage after hours (4p -7a ) and weekends on [email or web address]

## 2019-03-28 NOTE — Progress Notes (Signed)
PROGRESS NOTE    Amy Shepard  GQQ:[id number] DOB: 01-01-1946 DOA: 03/22/2019 PCP: Maurice Small, MD   Brief Narrative:  73 year old lady with prior h/o hypertension, hyperlipidemia, DM, stage 3 CKD, CAD, diastolic heart failure, mild aortic stenosis, lung cancer s/p right lower lung superior segmentectomy , COPD on 5 lit of [place] oxygen at home, pulmonary hypertension, h/o orthostatic hypotension, recent admission for right ankle fusion on 02/11/2019 due to charcot collapse, non union of right ankle fracture presents with dizziness since 5 days.   Pt seen and examined, no new complaints or changes.  Her dizziness has improved. She is medically stable for discharge.  No changes in medications. Awaiting for a bed at SNF.   Assessment & Plan:   Principal Problem:   Dizziness Active Problems:   Chronic obstructive pulmonary disease ([name])   Type 2 diabetes mellitus with complication, with long-term current use of insulin ([name])   HTN (hypertension)   RVF (right ventricular failure) ([place])   Pulmonary hypertension, unspecified ([name])   Orthostatic hypotension   Orthostatic hypotension and associated dizziness:  Probably secondary to dehydration. . RESOLVED.   On Midodrine 7.5 mg TID.  No dizziness or new complaints   Hypertension:  Better controlled.  Continue with lasix 40 mg BID.   Chronic respiratory failure on 5 lit of [place] oxygen due to COPD and moderate pulmonary hypertension; Currently back to 5 lit of [place] oxygen.  CXR unchanged from before.  Good sats on 5 L of nasal cannula oxygen and encouraged the patient to use incentive spirometry. No new complaints.   Anemia of chronic disease:  Hemoglobin around 9 and stable.  No new labs ordered. She is waiting for a bed at SNF.    Stage 3 CKD:   Improving at 1.18.,  Bicarb within normal limits. Continue to monitor.   H/o lung cancer s/p segmentectomy: Recommend outpatient follow up with Dr [name].    Hypokalemia:   Replaced. Repeat in am.   Hyperlipidemia:  Resume with lipitor.    Pulmonary hypertension:  Continue with opsumit and Slexipag.  Echocardiogram last month revealed preserved LVEF and mod RV  Dilation. Resume lasix 40 mg BID.   Diabetes Mellitus with neuropathy:  CBG (last 3)  Recent Labs    03/27/19 2050 03/28/19 0619 03/28/19 1205  GLUCAP 204* 169* 152*   Resume SSI.  Suboptimally controlled CBGs  change sliding scale to moderate SSI. Continue with levemir 20 units daily.  No changes in medications.  .    Chronic diastolic CHF:  Restarted lasix 40 mg BID by heart failure team.  diuresed about 1.7 lit in the last 24 hours.  Creatinine stable and improving.  Appreciate HF team follow up.    H/o of CAD:  Non obstructive.  Pt denies any chest pain or sob.    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  CT showed loosening of the hardware due to charcot ankle. Orthopedics recommends patella tendon right double upright AFO brace for 50% PWB .  Outpatient follow up with orthopedics Dr Louanne Skye, recommended on discharge.    No new changes. Pt not using boot when in bed.   DVT prophylaxis: lovenox.  Code Status: full code.  Family Communication: none at bedside.  Disposition Plan: pending SNF placement. Awaiting for a bed. Discussed with CSW and a repeat covid  Is pending.    Consultants:  Orthopedics.   Cardiology.   Procedures: none.   Antimicrobials: none.   Subjective: No chest pain or sob. No nausea or vomiting. No complaints of pain.   Objective: Vitals:   03/28/19 0628 03/28/19 0717 03/28/19 1203 03/28/19 1415  BP: (!) 110/58  113/66   Pulse: 81  79   Resp: 18  18   Temp: 98.1 F (36.7 C)  98.3 F (36.8 C)   TempSrc:   Oral   SpO2: (!) 82% (!) 87% 97% 98%  Weight: 71.7 kg     Height:        Intake/Output Summary (Last 24 hours) at  03/28/2019 1641 Last data filed at 03/28/2019 1600 Gross per 24 hour  Intake 2144 ml  Output 1500 ml  Net 644 ml   Filed Weights   03/26/19 0508 03/27/19 0513 03/28/19 0628  Weight: 73.9 kg 73.1 kg 71.7 kg    Examination:  General exam: alert and laying in the bed. Not in distress.  Respiratory system:  Air entry fair, no wheezing or rhonchi.  Cardiovascular system:  S1s2, RRR, no pedal edema.  Gastrointestinal system:  Abdomen is soft, non tender non distended bowel sounds good.  Central nervous system: alert and oriented, non focal.  Extremities: no pedal edema , cyanosis. Some tenderness present.  Skin: no lesions.  Psychiatry:  Appropriate mood.     Data Reviewed: I have personally reviewed following labs and imaging studies  CBC: Recent Labs  Lab 03/22/19 1654 03/23/19 0824  WBC 5.8 5.5  NEUTROABS 4.3  --   HGB 9.6* 9.3*  HCT 31.4* 30.3*  MCV 87.2 87.1  PLT 290 676   Basic Metabolic Panel: Recent Labs  Lab 03/24/19 1043 03/25/19 0424 03/26/19 0647 03/27/19 0522 03/28/19 0653  NA 143 141 141 140 139  K 4.0 4.1 3.6 3.6 3.4*  CL 106 106 102 100 100  CO2 _0 GLUCOSE 150* 116* 117* 148* 172*  BUN 36* 32* 29* 38* 43*  CREATININE 1.49* 1.26* 1.27* 1.16* 1.18*  CALCIUM 9.3 9.2 9.4 9.3 8.9   GFR: Estimated Creatinine Clearance: 39.1 mL/min (A) (by C-G formula based on SCr of 1.18 mg/dL (H)). Liver Function Tests: Recent Labs  Lab 03/22/19 1654 03/23/19 0824  AST 19 18  ALT 14 15  ALKPHOS 104 105  BILITOT 0.8 0.5  PROT 6.1* 6.1*  ALBUMIN 3.2* 3.1*   No results for input(s): LIPASE, AMYLASE in the last 168 hours. No results for input(s): AMMONIA in the last 168 hours. Coagulation Profile: No results for input(s): INR, PROTIME in the last 168 hours. Cardiac Enzymes: No results for input(s): CKTOTAL, CKMB, CKMBINDEX, TROPONINI in the last 168 hours. BNP (last 3 results) No results for input(s): PROBNP in the last 8760 hours. HbA1C: No  results for input(s): HGBA1C in the last 72 hours. CBG: Recent Labs  Lab 03/27/19 1204 03/27/19 1601 03/27/19 2050 03/28/19 0619 03/28/19 1205  GLUCAP 139* 157* 204* 169* 152*   Lipid Profile: No results for input(s): CHOL, HDL, LDLCALC, TRIG, CHOLHDL, LDLDIRECT in the last 72 hours. Thyroid Function Tests: No results for input(s): TSH, T4TOTAL, FREET4, T3FREE, THYROIDAB in the last 72 hours. Anemia Panel: No results for input(s): VITAMINB12, FOLATE, FERRITIN, TIBC, IRON, RETICCTPCT in the last 72 hours. Sepsis Labs: Recent Labs  Lab 03/22/19 1654 03/23/19 0232  LATICACIDVEN 0.6 0.9    Recent Results (from the past 240 hour(s))  Urine Culture     Status: Abnormal   Collection  Time: 03/22/19  6:11 PM   Specimen: Urine, Random  Result Value Ref Range Status   Specimen Description URINE, RANDOM  Final   Special Requests NONE  Final   Culture (A)  Final    30,000 COLONIES/mL GROUP B STREP(S.AGALACTIAE)ISOLATED TESTING AGAINST S. AGALACTIAE NOT ROUTINELY PERFORMED DUE TO PREDICTABILITY OF AMP/PEN/VAN SUSCEPTIBILITY. Performed at [facility], [place] [address] [place], [place] [address]    Report Status 03/23/2019 FINAL  Final  SARS Coronavirus 2 [facility] order, Performed in [facility] hospital lab) Nasopharyngeal Nasopharyngeal Swab     Status: None   Collection Time: 03/22/19  9:27 PM   Specimen: Nasopharyngeal Swab  Result Value Ref Range Status   SARS Coronavirus 2 NEGATIVE NEGATIVE Final    Comment: (NOTE) If result is NEGATIVE SARS-CoV-2 target nucleic acids are NOT DETECTED. The SARS-CoV-2 RNA is generally detectable in upper and lower  respiratory specimens during the acute phase of infection. The lowest  concentration of SARS-CoV-2 viral copies this assay can detect is 250  copies / mL. A negative result does not preclude SARS-CoV-2 infection  and should not be used as the sole basis for treatment or other  patient management decisions.  A negative result  may occur with  improper specimen collection / handling, submission of specimen other  than nasopharyngeal swab, presence of viral mutation(s) within the  areas targeted by this assay, and inadequate number of viral copies  (<250 copies / mL). A negative result must be combined with clinical  observations, patient history, and epidemiological information. If result is POSITIVE SARS-CoV-2 target nucleic acids are DETECTED. The SARS-CoV-2 RNA is generally detectable in upper and lower  respiratory specimens dur ing the acute phase of infection.  Positive  results are indicative of active infection with SARS-CoV-2.  Clinical  correlation with patient history and other diagnostic information is  necessary to determine patient infection status.  Positive results do  not rule out bacterial infection or co-infection with other viruses. If result is PRESUMPTIVE POSTIVE SARS-CoV-2 nucleic acids MAY BE PRESENT.   A presumptive positive result was obtained on the submitted specimen  and confirmed on repeat testing.  While 2019 novel coronavirus  (SARS-CoV-2) nucleic acids may be present in the submitted sample  additional confirmatory testing may be necessary for epidemiological  and / or clinical management purposes  to differentiate between  SARS-CoV-2 and other Sarbecovirus currently known to infect humans.  If clinically indicated additional testing with an alternate test  methodology [phone]) is advised. The SARS-CoV-2 RNA is generally  detectable in upper and lower respiratory sp ecimens during the acute  phase of infection. The expected result is Negative. Fact Sheet for Patients:  StrictlyIdeas.no Fact Sheet for Healthcare Providers: BankingDealers.co.za This test is not yet approved or cleared by the [place] FDA and has been authorized for detection and/or diagnosis of SARS-CoV-2 by FDA under an Emergency Use Authorization (EUA).   This EUA will remain in effect (meaning this test can be used) for the duration of the COVID-19 declaration under Section 564(b)(1) of the Act, 21 U.S.C. section 360bbb-3(b)(1), unless the authorization is terminated or revoked sooner. Performed at [facility], [place] [address], [place], [place] [address]   SARS CORONAVIRUS 2 (TAT 6-24 HRS) Nasopharyngeal Nasopharyngeal Swab     Status: None   Collection Time: 03/26/19  2:46 PM   Specimen: Nasopharyngeal Swab  Result Value Ref Range Status   SARS Coronavirus 2 NEGATIVE NEGATIVE Final  Comment: (NOTE) SARS-CoV-2 target nucleic acids are NOT DETECTED. The SARS-CoV-2 RNA is generally detectable in upper and lower respiratory specimens during the acute phase of infection. Negative results do not preclude SARS-CoV-2 infection, do not rule out co-infections with other pathogens, and should not be used as the sole basis for treatment or other patient management decisions. Negative results must be combined with clinical observations, patient history, and epidemiological information. The expected result is Negative. Fact Sheet for Patients: SugarRoll.be Fact Sheet for Healthcare Providers: https://www.woods-mathews.com/ This test is not yet approved or cleared by the [place] FDA and  has been authorized for detection and/or diagnosis of SARS-CoV-2 by FDA under an Emergency Use Authorization (EUA). This EUA will remain  in effect (meaning this test can be used) for the duration of the COVID-19 declaration under Section 56 4(b)(1) of the Act, 21 U.S.C. section 360bbb-3(b)(1), unless the authorization is terminated or revoked sooner. Performed at [facility], [place] [address] [place], [place] [address]          Radiology Studies: No results found.      Scheduled Meds: . atorvastatin  40 mg Oral Daily  . azelastine  1 spray Each Nare BID  . enoxaparin (LOVENOX)  injection  40 mg Subcutaneous Q24H  . feeding supplement (PRO-STAT SUGAR FREE 64)  30 mL Oral BID  . furosemide  40 mg Oral BID  . gabapentin  300 mg Oral QHS  . guaiFENesin  600 mg Oral BID  . influenza vaccine adjuvanted  0.5 mL Intramuscular Once  . insulin aspart  0-15 Units Subcutaneous TID WC  . insulin aspart  0-5 Units Subcutaneous QHS  . insulin detemir  20 Units Subcutaneous QHS  . ipratropium  0.5 mg Nebulization TID  . levalbuterol  0.63 mg Nebulization TID  . macitentan  10 mg Oral Daily  . midodrine  7.5 mg Oral TID WC  . mometasone-formoterol  2 puff Inhalation BID  . potassium chloride  20 mEq Oral Daily  . Selexipag  600 mcg Oral BID  . sertraline  75 mg Oral Daily  . sodium chloride flush  3 mL Intravenous Q12H   Continuous Infusions: . sodium chloride       LOS: 4 days    Time spent: 25 MINUTES.     Hosie Poisson, MD [facility] Pager [phone] If 7PM-7AM, please contact night-coverage [email or web address] Password [facility] 03/28/2019, 4:41 PM

## 2019-03-28 NOTE — Progress Notes (Signed)
Physical Therapy Treatment Patient Details Name: Amy Shepard MRN: [id number] DOB: Jul 15, 1945 Today's Date: 03/28/2019    History of Present Illness Amy Shepard  is a 73 y.o. female admitted with dizziness with hypotension and new right fibula fx. 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 Practiced sit<>Stand transfers this session, and required min A. Pt with increased pain in RLE during transfers. Performed seated HEP with pt. Current recommendations appropriate. Will continue to follow acutely to maximize functional mobility independence and safety.    Follow Up Recommendations  SNF;Supervision for mobility/OOB     Equipment Recommendations  None recommended by PT    Recommendations for Other Services       Precautions / Restrictions Precautions Precautions: Fall Precaution Comments: watch O2 Required Braces or Orthoses: Other Brace Other Brace: CAM boot RLE, per ortho note patella tendon dbl upright AFO to be delivered Restrictions Weight Bearing Restrictions: Yes RUE Weight Bearing: Weight bearing as tolerated LUE Weight Bearing: Weight bearing as tolerated RLE Weight Bearing: Partial weight bearing RLE Partial Weight Bearing Percentage or Pounds: limited to transfers until AFO with less than 50%    Mobility  Bed Mobility               General bed mobility comments: In chair upon entry.   Transfers Overall transfer level: Needs assistance Equipment used: Rolling walker (2 wheeled) Transfers: Sit to/from Stand Sit to Stand: Min assist         General transfer comment: Practiced sit<>Stand X3. Cues for weightbearing precautions on  RLE. Pt reporting increased pain with weightbearing, so further mobility deferred.   Ambulation/Gait                 Stairs             Wheelchair Mobility    Modified Rankin (Stroke Patients Only)       Balance Overall balance assessment: Needs assistance Sitting-balance support: No upper extremity supported;Feet supported Sitting balance-Leahy Scale: Good     Standing balance support: Bilateral upper extremity supported Standing balance-Leahy Scale: Poor Standing balance comment: reliant on BUE support                             Cognition Arousal/Alertness: Awake/alert Behavior During Therapy: WFL for tasks assessed/performed Overall Cognitive Status: Within Functional Limits for tasks assessed                                        Exercises General Exercises - Lower Extremity Ankle Circles/Pumps: AROM;Left;20 reps Long Arc Quad: AROM;Both;Seated;10 reps Hip ABduction/ADduction: AROM;Both;10 reps;Seated(hip adduction pillow squeezes) Hip Flexion/Marching: AROM;Both;Seated;10 reps    General Comments        Pertinent Vitals/Pain Pain Assessment: Faces Faces Pain Scale: Hurts even more Pain Location: R foot with weight bearing Pain Descriptors / Indicators: Grimacing;Guarding Pain Intervention(s): Limited activity within patient's tolerance;Monitored during session;Repositioned    Home Living                      Prior Function  PT Goals (current goals can now be found in the care plan section) Acute Rehab PT Goals Patient Stated Goal: to go to rehab PT Goal Formulation: With patient Time For Goal Achievement: 04/07/19 Potential to Achieve Goals: Fair Progress towards PT goals: Progressing toward goals    Frequency    Min 2X/week      PT Plan Current plan remains appropriate    Co-evaluation              AM-PAC PT "6 Clicks" Mobility   Outcome Measure  Help needed turning  from your back to your side while in a flat bed without using bedrails?: None Help needed moving from lying on your back to sitting on the side of a flat bed without using bedrails?: None Help needed moving to and from a bed to a chair (including a wheelchair)?: A Little Help needed standing up from a chair using your arms (e.g., wheelchair or bedside chair)?: A Little Help needed to walk in hospital room?: A Lot Help needed climbing 3-5 steps with a railing? : Total 6 Click Score: 17    End of Session Equipment Utilized During Treatment: Oxygen;Other (comment)(CAM boot ) Activity Tolerance: Patient tolerated treatment well Patient left: in chair;with call bell/phone within reach Nurse Communication: Mobility status PT Visit Diagnosis: Muscle weakness (generalized) (M62.81);Other abnormalities of gait and mobility (R26.89)     Time: 1044-1100 PT Time Calculation (min) (ACUTE ONLY): 16 min  Charges:  $Therapeutic Activity: 8-22 mins                     Leighton Ruff, PT, DPT  Acute Rehabilitation Services  Pager: [phone] Office: [phone]    Amy Shepard 03/28/2019, 11:14 AM

## 2019-03-28 NOTE — Plan of Care (Signed)
  Problem: Education: Goal: Knowledge of General Education information will improve Description: Including pain rating scale, medication(s)/side effects and non-pharmacologic comfort measures Outcome: Progressing   Problem: Health Behavior/Discharge Planning: Goal: Ability to manage health-related needs will improve Outcome: Progressing   Problem: Clinical Measurements: Goal: Ability to maintain clinical measurements within normal limits will improve Outcome: Progressing Goal: Will remain free from infection Outcome: Progressing Goal: Diagnostic test results will improve Outcome: Progressing Goal: Respiratory complications will improve Outcome: Progressing Goal: Cardiovascular complication will be avoided Outcome: Progressing   Problem: Activity: Goal: Risk for activity intolerance will decrease Outcome: Progressing   Problem: Elimination: Goal: Will not experience complications related to bowel motility Outcome: Progressing   Problem: Pain Managment: Goal: General experience of comfort will improve Outcome: Progressing   Problem: Safety: Goal: Ability to remain free from injury will improve Outcome: Progressing   Problem: Education: Goal: Ability to demonstrate management of disease process will improve Outcome: Progressing Goal: Ability to verbalize understanding of medication therapies will improve Outcome: Progressing Goal: Individualized Educational Video(s) Outcome: Progressing   Problem: Activity: Goal: Capacity to carry out activities will improve Outcome: Progressing   Problem: Cardiac: Goal: Ability to achieve and maintain adequate cardiopulmonary perfusion will improve Outcome: Progressing

## 2019-03-29 ENCOUNTER — Telehealth (HOSPITAL_COMMUNITY): Payer: Self-pay

## 2019-03-29 DIAGNOSIS — E876 Hypokalemia: Secondary | ICD-10-CM

## 2019-03-29 LAB — BASIC METABOLIC PANEL
Anion gap: 13 (ref 5–15)
BUN: 45 mg/dL — ABNORMAL HIGH (ref 8–23)
CO2: 29 mmol/L (ref 22–32)
Calcium: 9.4 mg/dL (ref 8.9–10.3)
Chloride: 97 mmol/L — ABNORMAL LOW (ref 98–111)
Creatinine, Ser: 1.14 mg/dL — ABNORMAL HIGH (ref 0.44–1.00)
GFR calc Af Amer: 56 mL/min — ABNORMAL LOW (ref >60)
GFR calc non Af Amer: 48 mL/min — ABNORMAL LOW (ref >60)
Glucose, Bld: 255 mg/dL — ABNORMAL HIGH (ref 70–99)
Potassium: 3.5 mmol/L (ref 3.5–5.1)
Sodium: 139 mmol/L (ref 135–145)

## 2019-03-29 LAB — GLUCOSE, CAPILLARY
Glucose-Capillary: 228 mg/dL — ABNORMAL HIGH (ref 70–99)
Glucose-Capillary: 253 mg/dL — ABNORMAL HIGH (ref 70–99)
Glucose-Capillary: 215 mg/dL — ABNORMAL HIGH (ref 70–99)

## 2019-03-29 MED ORDER — TRAMADOL HCL 50 MG PO TABS: 50.0000 mg | ORAL_TABLET | Freq: Four times a day (QID) | ORAL | 0 refills | Status: DC | PRN

## 2019-03-29 MED ORDER — FUROSEMIDE 40 MG PO TABS: 40.0000 mg | ORAL_TABLET | Freq: Two times a day (BID) | ORAL | 2 refills | Status: DC

## 2019-03-29 MED ORDER — POTASSIUM CHLORIDE CRYS ER 20 MEQ PO TBCR
20.0000 meq | EXTENDED_RELEASE_TABLET | Freq: Once | ORAL | Status: AC
  Administered 2019-03-29: 11:00:00 20 meq via ORAL
  Filled 2019-03-29: qty 1

## 2019-03-29 MED ORDER — POTASSIUM CHLORIDE CRYS ER 20 MEQ PO TBCR: 20.0000 meq | EXTENDED_RELEASE_TABLET | Freq: Every day | ORAL | 0 refills | Status: DC

## 2019-03-29 NOTE — Telephone Encounter (Signed)
Pt advised of MD recommendations to continue medicine for now.  Advised to call office if symptoms worsen. Verbalized understanding

## 2019-03-29 NOTE — Telephone Encounter (Signed)
Pt is currently inpatient in [facility].  She reports that she is having a lot of side effects from [place] including headaches, back and arm pain.  She says she is slated for dc to nursing facility tomorrow and would like to know what the doctor recommends at this time before she leaves the hospital.  Please advise

## 2019-03-29 NOTE — Progress Notes (Signed)
Patient report given to nurse [facility] at [facility]. Marcille Blanco, RN

## 2019-03-29 NOTE — Telephone Encounter (Signed)
Continue for now, let's give it a little more time.

## 2019-03-29 NOTE — Discharge Summary (Signed)
Physician Discharge Summary  Amy Shepard WUJ:[id number] DOB: [birth year] DOA: 03/22/2019  PCP: Maurice Small, MD  Admit date: 03/22/2019 Discharge date: 03/29/2019  Admitted From: Home.  Disposition:  SNF   Recommendations for Outpatient Follow-up:  1. Follow up with PCP in 1-2 weeks 2. Please obtain BMP/CBC in one week 3.  Please follow up with Cardiology as recommended.    Discharge Condition:stable.  CODE STATUS: full code.  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   Pt seen and examined, no new complaints or changes.  Her dizziness has improved. She is medically stable for discharge.   Discharge Diagnoses:  Principal Problem:   Dizziness Active Problems:   Chronic obstructive pulmonary disease ([name])   Type 2 diabetes mellitus with complication, with long-term current use of insulin ([name])   HTN (hypertension)   RVF (right ventricular failure) ([place])   Pulmonary hypertension, unspecified ([name])   Orthostatic hypotension  Orthostatic hypotension and associated dizziness:  Probably secondary to dehydration. . RESOLVED.   On Midodrine 7.5 mg TID.  No dizziness or new complaints   Hypertension:  Better controlled.  Continue with lasix 40 mg BID.   Chronic respiratory failure on 5 lit of [place] oxygen due to COPD and moderate pulmonary hypertension; Currently back to 5 lit of [place] oxygen.  CXR unchanged from before.  Good sats on 5 L of nasal cannula oxygen and encouraged the patient to use incentive spirometry. No new complaints.    Anemia of chronic disease:  Hemoglobin around 9 and stable.  No new labs ordered. She is waiting for a bed at SNF.    Stage 3 CKD:   Improving at 1.18.,  Bicarb within normal limits. Continue to monitor.   H/o lung cancer s/p segmentectomy: Recommend outpatient follow up with Dr [name].    Hypokalemia:  Replaced.   Hyperlipidemia:  Resume with lipitor.    Pulmonary hypertension:  Continue with opsumit and Slexipag.  Echocardiogram last month revealed preserved LVEF and mod RV  Dilation. Resume lasix 40 mg BID.   Diabetes Mellitus with neuropathy:  CBG (last 3)  Recent Labs    03/28/19 2220 03/29/19 0619 03/29/19 1115  GLUCAP 156* 228* 253*     Continue with levemir 20 units daily.  No changes in medications.  .    Chronic diastolic CHF:  Restarted lasix 40 mg BID by heart failure team.  Creatinine stable and improving.  Appreciate HF team follow up.    H/o of CAD:  Non obstructive.  Pt denies any chest pain or sob.    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  CT showed loosening of the hardware due to charcot ankle. Orthopedics recommends patella tendon right double upright AFO brace for 50% PWB .  Outpatient follow up with orthopedics Dr Louanne Skye, recommended on discharge.      Discharge Instructions  Discharge Instructions    Diet - low sodium heart healthy   Complete by: As directed    Diet -  low sodium heart healthy   Complete by: As directed    Diet Carb Modified   Complete by: As directed    Discharge instructions   Complete by: As directed    Most antibiotic cause diarrhea-- can use over the counter lactobacillus or kefir (type of yogurt) to help with diarrhea Wear CAM boot   Discharge instructions   Complete by: As directed    Please follow up with heart failure clinic as recommended.   Increase activity  slowly   Complete by: As directed      Allergies as of 03/29/2019      Reactions   Amoxicillin Anaphylaxis, Hives, Rash, Other (See Comments)   Has patient had a PCN reaction causing immediate rash, facial/tongue/throat swelling, SOB or lightheadedness with hypotension: YES Positive reaction causing SEVERE RASH INVOLVING MUCUS MEMBRANES/SKIN NECROSIS: YES Reaction that required HOSPITALIZATION: YES Reaction occurring within the last 10 years: NO   Doxycycline Diarrhea      Medication List    STOP taking these medications   citalopram 10 MG tablet Commonly known as: CELEXA   doxycycline 100 MG capsule Commonly known as: VIBRAMYCIN   polyethylene glycol 17 g packet Commonly known as: MIRALAX / GLYCOLAX   sodium chloride 0.65 % Soln nasal spray Commonly known as: OCEAN     TAKE these medications   acetaminophen 325 MG tablet Commonly known as: TYLENOL Take 650 mg by mouth every 6 (six) hours as needed for moderate pain or fever.   albuterol (2.5 MG/3ML) 0.083% nebulizer solution Commonly known as: PROVENTIL Take 3 mLs (2.5 mg total) by nebulization every 4 (four) hours as needed for wheezing or shortness of breath.   atorvastatin 40 MG tablet Commonly known as: LIPITOR Take 1 tablet (40 mg total) by mouth daily.   azelastine 0.1 % nasal spray Commonly known as: ASTELIN Place 1 spray into both nostrils 2 (two) times daily. Use in each nostril as directed   budesonide 0.5 MG/2ML nebulizer solution Commonly known as: PULMICORT Take 2 mLs (0.5 mg total) by nebulization 2 (two) times daily.   docusate sodium 100 MG capsule Commonly known as: COLACE Take 1 capsule (100 mg total) by mouth 2 (two) times daily as needed for mild constipation.   furosemide 40 MG tablet Commonly known as: LASIX Take 1 tablet (40 mg total) by mouth 2 (two) times daily.   gabapentin 300 MG capsule Commonly known as: NEURONTIN Take 1 capsule (300 mg total) by mouth at bedtime.    guaiFENesin 600 MG 12 hr tablet Commonly known as: MUCINEX Take 1 tablet (600 mg total) by mouth 2 (two) times daily.   insulin detemir 100 UNIT/ML injection Commonly known as: LEVEMIR Inject 0.2 mLs (20 Units total) into the skin at bedtime.   insulin lispro 100 UNIT/ML injection Commonly known as: HUMALOG Inject 2-11 Units into the skin See admin instructions. Inject 2-11 units into the skin three times a day before meals and at bedtime, per sliding scale   ipratropium 0.02 % nebulizer solution Commonly known as: ATROVENT Take 2.5 mLs (0.5 mg total) by nebulization 3 (three) times daily.   iron polysaccharides 150 MG capsule Commonly known as: NIFEREX Take 1 capsule (150 mg total) by mouth 2 (two) times daily.   levalbuterol 0.63 MG/3ML nebulizer solution Commonly known as: XOPENEX Take 3 mLs (0.63 mg total) by nebulization 3 (three) times daily.   macitentan 10 MG tablet Commonly known as: Opsumit Take 1 tablet (10 mg total) by mouth daily.  midodrine 2.5 MG tablet Commonly known as: PROAMATINE Take 3 tablets (7.5 mg total) by mouth 3 (three) times daily with meals. What changed:   medication strength  how much to take   mometasone-formoterol 200-5 MCG/ACT Aero Commonly known as: DULERA Inhale 2 puffs into the lungs 2 (two) times daily.   OXYGEN Inhale 4-5 L/min into the lungs continuous.   Perforomist 20 MCG/2ML nebulizer solution Generic drug: formoterol Take 2 mLs (20 mcg total) by nebulization 2 (two) times daily.   potassium chloride SA 20 MEQ tablet Commonly known as: K-DUR Take 1 tablet (20 mEq total) by mouth daily. Start taking on: March 30, 2019   sertraline 50 MG tablet Commonly known as: ZOLOFT Take 75 mg by mouth daily.   traMADol 50 MG tablet Commonly known as: ULTRAM Take 1 tablet (50 mg total) by mouth every 6 (six) hours as needed for moderate pain.   Trulicity 1.5 OH/6.0VP Sopn Generic drug: Dulaglutide Inject 1.5 mg into the  vein every Tuesday.   umeclidinium bromide 62.5 MCG/INH Aepb Commonly known as: INCRUSE ELLIPTA Inhale 1 puff into the lungs daily.   Uptravi 600 MCG Tabs Generic drug: Selexipag Take 600 mcg by mouth 2 (two) times daily.      Follow-up Information    Maurice Small, MD Follow up in 1 week(s).   Specialty: Family Medicine Contact information: [address] [place] [address] [phone]        Larey Dresser, MD. Go on 04/04/2019.   Specialty: Cardiology Why: 10:00 AM, parking code [address] information: [place] [address] [phone]        Newt Minion, MD Follow up in 1 week(s).   Specialty: Orthopedic Surgery Why: keep appointment for Monday Contact information: [address] [place] [address] [phone]          Allergies  Allergen Reactions  . Amoxicillin Anaphylaxis, Hives, Rash and Other (See Comments)    Has patient had a PCN reaction causing immediate rash, facial/tongue/throat swelling, SOB or lightheadedness with hypotension: YES Positive reaction causing SEVERE RASH INVOLVING MUCUS MEMBRANES/SKIN NECROSIS: YES Reaction that required HOSPITALIZATION: YES Reaction occurring within the last 10 years: NO  . Doxycycline Diarrhea    Consultations:  Heart failure clinic.    Procedures/Studies: Dg Chest 2 View  Result Date: 03/25/2019 CLINICAL DATA:  Shortness of breath, history of CHF EXAM: CHEST - 2 VIEW COMPARISON:  03/22/2019 FINDINGS: Cardiomegaly. Pulmonary vascular prominence without overt edema. There is bandlike scarring or atelectasis of the right mid lung with associated surgical clips. Disc degenerative disease of the thoracic spine. IMPRESSION: No significant interval change in chest radiographs. Cardiomegaly and pulmonary vascular prominence without overt edema. Bandlike scarring or atelectasis of the right mid lung. Electronically Signed   By: Eddie Candle M.D.   On:  03/25/2019 13:27   Dg Chest 2 View  Result Date: 03/22/2019 CLINICAL DATA:  Dizziness EXAM: CHEST - 2 VIEW COMPARISON:  03/02/2019 FINDINGS: Stable cardiomediastinal contours. Aorta is calcified. No focal airspace consolidation, pleural effusion, or pneumothorax. IMPRESSION: No active cardiopulmonary disease. Electronically Signed   By: Davina Poke M.D.   On: 03/22/2019 16:14   Dg Ankle Complete Right  Result Date: 03/22/2019 CLINICAL DATA:  Recent right ankle ORIF. EXAM: RIGHT ANKLE - COMPLETE 3+ VIEW COMPARISON:  Right ankle x-rays dated February 07, 2019. FINDINGS: New subacute transverse fracture of the lateral malleolus with slight lateral displacement. Progressive callus formation surrounding the subacute  distal fibular diaphyseal fracture. Subacute, healing nondisplaced fracture of the medial malleolus with increased surrounding callus formation. Subacute, mildly displaced fracture of the posterior malleolus with increased callus formation. Mild lateral subluxation of the talus with respect to the tibial plafond. No dislocation. Interval removal of the fibular plate and screw construct, as well as the medial malleolar screw. Interval tibiotalar joint fusion with anterior plate and screw construct. No definite bridging bone. There is loosening and slight backing out of both talar head screws. Unchanged midfoot osteoarthritis. Osteopenia. Diffuse soft tissue swelling about the ankle. IMPRESSION: 1. New subacute transverse fracture of the lateral malleolus. 2. Healing medial and posterior malleolar fractures. Healing distal fibular diaphyseal fracture. 3. Interval tibiotalar joint fusion with anterior plate and screw construct. Loosening of both talar head screws. Electronically Signed   By: Titus Dubin M.D.   On: 03/22/2019 16:22   Ct Head Wo Contrast  Result Date: 03/22/2019 CLINICAL DATA:  Vertigo episodic peripheral EXAM: CT HEAD WITHOUT CONTRAST TECHNIQUE: Contiguous axial images were obtained  from the base of the skull through the vertex without intravenous contrast. COMPARISON:  CT head 03/11/2018 FINDINGS: Brain: Mild atrophy. Patchy white matter hypodensity bilaterally appears stable from the prior study. No acute infarct, hemorrhage, mass. Vascular: Negative for hyperdense vessel Skull: Negative Sinuses/Orbits: Paranasal sinuses clear. Right mastoid effusion. Bilateral cataract surgery. Other: None IMPRESSION: No acute abnormality no change from the prior study. Stable atrophy and chronic microvascular ischemic change in the white matter. Electronically Signed   By: Franchot Gallo M.D.   On: 03/22/2019 16:20   Ct Ankle Right Wo Contrast  Result Date: 03/23/2019 CLINICAL DATA:  Recent right ankle fusion now with right fibular fracture. Assess tibiotalar fusion for displacement EXAM: CT OF THE RIGHT ANKLE WITHOUT CONTRAST TECHNIQUE: Multidetector CT imaging of the right ankle was performed according to the standard protocol. Multiplanar CT image reconstructions were also generated. COMPARISON:  Plain films 03/22/2019. FINDINGS: There has been interval removal of distal fibular hardware since plain films from 02/07/2019. Removal of the medial malleolar screw also noted. Placement of a plate and screw fixation device across the tibiotalar joint anteriorly. There is displaced fractures through the medial malleolus and posterior malleolus of the tibia. Healing distal fibular shaft fracture with callus formation. Fracture line remains visible. There is a new fracture in the distal fibula which may be at the site of prior distal screw. There is irregularity noted within the bone in this area suggesting the possibility of infection. There is large lucency noted in the distal tibia at the site of prior syndesmotic screw. This appears larger within the tibia than expected after screw removal and may be related to infection. Lucency in the talar dome noted the screw which enters from the tibia into the talar  dome concerning for infection. IMPRESSION: Removal of prior hardware. Displaced medial malleolar and posterior malleolar fractures noted. Healing distal fibular shaft fracture. Fracture line remains evident with exuberant callus. There is a new fracture in the distal fibula, likely at the site of the previous distal most screw. The bones irregular in this area. This could be related to infection. Lucency in the distal tibia at the site of previous syndesmotic screw which appears larger than expected following screw removal and may be related to infection. Lucency around the talar dome screw concerning for infection. Electronically Signed   By: Rolm Baptise M.D.   On: 03/23/2019 00:58   Dg Chest Port 1 View  Result Date: 03/02/2019 CLINICAL DATA:  Shortness of breath EXAM: PORTABLE CHEST 1 VIEW COMPARISON:  02/28/2019 FINDINGS: Cardiac shadow is stable. Persistent left basilar atelectasis is seen. The right lung remains clear. Aortic calcifications are noted. No bony abnormality is seen. IMPRESSION: Stable left basilar atelectasis. Electronically Signed   By: Inez Catalina M.D.   On: 03/02/2019 08:00   Dg Chest Port 1 View  Result Date: 02/28/2019 CLINICAL DATA:  Cough, shortness of breath. EXAM: PORTABLE CHEST 1 VIEW COMPARISON:  Radiographs of February 27, 2019. FINDINGS: Stable cardiomediastinal silhouette. Atherosclerosis of thoracic aorta is noted. No pneumothorax is noted. Right lung is clear. Mild left basilar opacity is noted laterally suggesting scarring or subsegmental atelectasis. Bony thorax is unremarkable. IMPRESSION: Mild left basilar subsegmental atelectasis or scarring is noted. Aortic Atherosclerosis (ICD10-I70.0). Electronically Signed   By: Marijo Conception M.D.   On: 02/28/2019 08:07       Subjective: Some neck pain, but better. Discussed about the cardiology recommendations.   Discharge Exam: Vitals:   03/29/19 1113 03/29/19 1345  BP: (!) 126/58   Pulse: 77   Resp: 20   Temp:  97.8 F (36.6 C)   SpO2: 96% 96%   Vitals:   03/29/19 0726 03/29/19 0830 03/29/19 1113 03/29/19 1345  BP:  (!) 116/53 (!) 126/58   Pulse:  80 77   Resp:   20   Temp:   97.8 F (36.6 C)   TempSrc:   Oral   SpO2: 90%  96% 96%  Weight:      Height:        General: Pt is alert, awake, not in acute distress Cardiovascular: RRR, S1/S2 +, no rubs, no gallops Respiratory: CTA bilaterally, no wheezing, no rhonchi Abdominal: Soft, NT, ND, bowel sounds + Extremities: no edema, no cyanosis    The results of significant diagnostics from this hospitalization (including imaging, microbiology, ancillary and laboratory) are listed below for reference.     Microbiology: Recent Results (from the past 240 hour(s))  Urine Culture     Status: Abnormal   Collection Time: 03/22/19  6:11 PM   Specimen: Urine, Random  Result Value Ref Range Status   Specimen Description URINE, RANDOM  Final   Special Requests NONE  Final   Culture (A)  Final    30,000 COLONIES/mL GROUP B STREP(S.AGALACTIAE)ISOLATED TESTING AGAINST S. AGALACTIAE NOT ROUTINELY PERFORMED DUE TO PREDICTABILITY OF AMP/PEN/VAN SUSCEPTIBILITY. Performed at [facility], [place] [address], [place], [place] [address]    Report Status 03/23/2019 FINAL  Final  SARS Coronavirus 2 [facility] order, Performed in [facility] hospital lab) Nasopharyngeal Nasopharyngeal Swab     Status: None   Collection Time: 03/22/19  9:27 PM   Specimen: Nasopharyngeal Swab  Result Value Ref Range Status   SARS Coronavirus 2 NEGATIVE NEGATIVE Final    Comment: (NOTE) If result is NEGATIVE SARS-CoV-2 target nucleic acids are NOT DETECTED. The SARS-CoV-2 RNA is generally detectable in upper and lower  respiratory specimens during the acute phase of infection. The lowest  concentration of SARS-CoV-2 viral copies this assay can detect is 250  copies / mL. A negative result does not preclude SARS-CoV-2 infection  and should not be used as the sole basis  for treatment or other  patient management decisions.  A negative result may occur with  improper specimen collection / handling, submission of specimen other  than nasopharyngeal swab, presence of viral mutation(s) within the  areas targeted by this assay, and inadequate number of viral copies  (<250  copies / mL). A negative result must be combined with clinical  observations, patient history, and epidemiological information. If result is POSITIVE SARS-CoV-2 target nucleic acids are DETECTED. The SARS-CoV-2 RNA is generally detectable in upper and lower  respiratory specimens dur ing the acute phase of infection.  Positive  results are indicative of active infection with SARS-CoV-2.  Clinical  correlation with patient history and other diagnostic information is  necessary to determine patient infection status.  Positive results do  not rule out bacterial infection or co-infection with other viruses. If result is PRESUMPTIVE POSTIVE SARS-CoV-2 nucleic acids MAY BE PRESENT.   A presumptive positive result was obtained on the submitted specimen  and confirmed on repeat testing.  While 2019 novel coronavirus  (SARS-CoV-2) nucleic acids may be present in the submitted sample  additional confirmatory testing may be necessary for epidemiological  and / or clinical management purposes  to differentiate between  SARS-CoV-2 and other Sarbecovirus currently known to infect humans.  If clinically indicated additional testing with an alternate test  methodology [phone]) is advised. The SARS-CoV-2 RNA is generally  detectable in upper and lower respiratory sp ecimens during the acute  phase of infection. The expected result is Negative. Fact Sheet for Patients:  StrictlyIdeas.no Fact Sheet for Healthcare Providers: BankingDealers.co.za This test is not yet approved or cleared by the [place] FDA and has been authorized for detection and/or  diagnosis of SARS-CoV-2 by FDA under an Emergency Use Authorization (EUA).  This EUA will remain in effect (meaning this test can be used) for the duration of the COVID-19 declaration under Section 564(b)(1) of the Act, 21 U.S.C. section 360bbb-3(b)(1), unless the authorization is terminated or revoked sooner. Performed at [facility], [place] [address], [place], [place] [address]   SARS CORONAVIRUS 2 (TAT 6-24 HRS) Nasopharyngeal Nasopharyngeal Swab     Status: None   Collection Time: 03/26/19  2:46 PM   Specimen: Nasopharyngeal Swab  Result Value Ref Range Status   SARS Coronavirus 2 NEGATIVE NEGATIVE Final    Comment: (NOTE) SARS-CoV-2 target nucleic acids are NOT DETECTED. The SARS-CoV-2 RNA is generally detectable in upper and lower respiratory specimens during the acute phase of infection. Negative results do not preclude SARS-CoV-2 infection, do not rule out co-infections with other pathogens, and should not be used as the sole basis for treatment or other patient management decisions. Negative results must be combined with clinical observations, patient history, and epidemiological information. The expected result is Negative. Fact Sheet for Patients: SugarRoll.be Fact Sheet for Healthcare Providers: https://www.woods-mathews.com/ This test is not yet approved or cleared by the [place] FDA and  has been authorized for detection and/or diagnosis of SARS-CoV-2 by FDA under an Emergency Use Authorization (EUA). This EUA will remain  in effect (meaning this test can be used) for the duration of the COVID-19 declaration under Section 56 4(b)(1) of the Act, 21 U.S.C. section 360bbb-3(b)(1), unless the authorization is terminated or revoked sooner. Performed at [facility], [place] [address] [place], [place] [address]      Labs: BNP (last 3 results) Recent Labs    04/09/18 1749 02/13/19 1805 02/24/19 0325  BNP  297.8* 1,211.6* 2,119.4*   Basic Metabolic Panel: Recent Labs  Lab 03/25/19 0424 03/26/19 0647 03/27/19 0522 03/28/19 0653 03/29/19 0705  NA 141 141 140 139 139  K 4.1 3.6 3.6 3.4* 3.5  CL 106 102 100 100 97*  CO2 _0 GLUCOSE 116* 117*  148* 172* 255*  BUN 32* 29* 38* 43* 45*  CREATININE 1.26* 1.27* 1.16* 1.18* 1.14*  CALCIUM 9.2 9.4 9.3 8.9 9.4   Liver Function Tests: Recent Labs  Lab 03/22/19 1654 03/23/19 0824  AST 19 18  ALT 14 15  ALKPHOS 104 105  BILITOT 0.8 0.5  PROT 6.1* 6.1*  ALBUMIN 3.2* 3.1*   No results for input(s): LIPASE, AMYLASE in the last 168 hours. No results for input(s): AMMONIA in the last 168 hours. CBC: Recent Labs  Lab 03/22/19 1654 03/23/19 0824  WBC 5.8 5.5  NEUTROABS 4.3  --   HGB 9.6* 9.3*  HCT 31.4* 30.3*  MCV 87.2 87.1  PLT 290 288   Cardiac Enzymes: No results for input(s): CKTOTAL, CKMB, CKMBINDEX, TROPONINI in the last 168 hours. BNP: Invalid input(s): POCBNP CBG: Recent Labs  Lab 03/28/19 1205 03/28/19 1701 03/28/19 2220 03/29/19 0619 03/29/19 1115  GLUCAP 152* 153* 156* 228* 253*   D-Dimer No results for input(s): DDIMER in the last 72 hours. Hgb A1c No results for input(s): HGBA1C in the last 72 hours. Lipid Profile No results for input(s): CHOL, HDL, LDLCALC, TRIG, CHOLHDL, LDLDIRECT in the last 72 hours. Thyroid function studies No results for input(s): TSH, T4TOTAL, T3FREE, THYROIDAB in the last 72 hours.  Invalid input(s): FREET3 Anemia work up No results for input(s): VITAMINB12, FOLATE, FERRITIN, TIBC, IRON, RETICCTPCT in the last 72 hours. Urinalysis    Component Value Date/Time   COLORURINE YELLOW 03/22/2019 1811   APPEARANCEUR CLEAR 03/22/2019 1811   LABSPEC 1.010 03/22/2019 1811   PHURINE 6.0 03/22/2019 1811   GLUCOSEU NEGATIVE 03/22/2019 1811   HGBUR NEGATIVE 03/22/2019 1811   BILIRUBINUR NEGATIVE 03/22/2019 1811   KETONESUR NEGATIVE 03/22/2019 1811   PROTEINUR NEGATIVE  03/22/2019 1811   UROBILINOGEN 0.2 02/10/2013 1041   NITRITE NEGATIVE 03/22/2019 1811   LEUKOCYTESUR NEGATIVE 03/22/2019 1811   Sepsis Labs Invalid input(s): PROCALCITONIN,  WBC,  LACTICIDVEN Microbiology Recent Results (from the past 240 hour(s))  Urine Culture     Status: Abnormal   Collection Time: 03/22/19  6:11 PM   Specimen: Urine, Random  Result Value Ref Range Status   Specimen Description URINE, RANDOM  Final   Special Requests NONE  Final   Culture (A)  Final    30,000 COLONIES/mL GROUP B STREP(S.AGALACTIAE)ISOLATED TESTING AGAINST S. AGALACTIAE NOT ROUTINELY PERFORMED DUE TO PREDICTABILITY OF AMP/PEN/VAN SUSCEPTIBILITY. Performed at [facility], [place] [address], [place], [place] [address]    Report Status 03/23/2019 FINAL  Final  SARS Coronavirus 2 [facility] order, Performed in [facility] hospital lab) Nasopharyngeal Nasopharyngeal Swab     Status: None   Collection Time: 03/22/19  9:27 PM   Specimen: Nasopharyngeal Swab  Result Value Ref Range Status   SARS Coronavirus 2 NEGATIVE NEGATIVE Final    Comment: (NOTE) If result is NEGATIVE SARS-CoV-2 target nucleic acids are NOT DETECTED. The SARS-CoV-2 RNA is generally detectable in upper and lower  respiratory specimens during the acute phase of infection. The lowest  concentration of SARS-CoV-2 viral copies this assay can detect is 250  copies / mL. A negative result does not preclude SARS-CoV-2 infection  and should not be used as the sole basis for treatment or other  patient management decisions.  A negative result may occur with  improper specimen collection / handling, submission of specimen other  than nasopharyngeal swab, presence of viral mutation(s) within the  areas targeted by this assay, and inadequate number of viral copies  (<  250 copies / mL). A negative result must be combined with clinical  observations, patient history, and epidemiological information. If result is POSITIVE SARS-CoV-2 target  nucleic acids are DETECTED. The SARS-CoV-2 RNA is generally detectable in upper and lower  respiratory specimens dur ing the acute phase of infection.  Positive  results are indicative of active infection with SARS-CoV-2.  Clinical  correlation with patient history and other diagnostic information is  necessary to determine patient infection status.  Positive results do  not rule out bacterial infection or co-infection with other viruses. If result is PRESUMPTIVE POSTIVE SARS-CoV-2 nucleic acids MAY BE PRESENT.   A presumptive positive result was obtained on the submitted specimen  and confirmed on repeat testing.  While 2019 novel coronavirus  (SARS-CoV-2) nucleic acids may be present in the submitted sample  additional confirmatory testing may be necessary for epidemiological  and / or clinical management purposes  to differentiate between  SARS-CoV-2 and other Sarbecovirus currently known to infect humans.  If clinically indicated additional testing with an alternate test  methodology [phone]) is advised. The SARS-CoV-2 RNA is generally  detectable in upper and lower respiratory sp ecimens during the acute  phase of infection. The expected result is Negative. Fact Sheet for Patients:  StrictlyIdeas.no Fact Sheet for Healthcare Providers: BankingDealers.co.za This test is not yet approved or cleared by the [place] FDA and has been authorized for detection and/or diagnosis of SARS-CoV-2 by FDA under an Emergency Use Authorization (EUA).  This EUA will remain in effect (meaning this test can be used) for the duration of the COVID-19 declaration under Section 564(b)(1) of the Act, 21 U.S.C. section 360bbb-3(b)(1), unless the authorization is terminated or revoked sooner. Performed at [facility], [place] [address], [place], [place] [address]   SARS CORONAVIRUS 2 (TAT 6-24 HRS) Nasopharyngeal Nasopharyngeal Swab     Status:  None   Collection Time: 03/26/19  2:46 PM   Specimen: Nasopharyngeal Swab  Result Value Ref Range Status   SARS Coronavirus 2 NEGATIVE NEGATIVE Final    Comment: (NOTE) SARS-CoV-2 target nucleic acids are NOT DETECTED. The SARS-CoV-2 RNA is generally detectable in upper and lower respiratory specimens during the acute phase of infection. Negative results do not preclude SARS-CoV-2 infection, do not rule out co-infections with other pathogens, and should not be used as the sole basis for treatment or other patient management decisions. Negative results must be combined with clinical observations, patient history, and epidemiological information. The expected result is Negative. Fact Sheet for Patients: SugarRoll.be Fact Sheet for Healthcare Providers: https://www.woods-mathews.com/ This test is not yet approved or cleared by the [place] FDA and  has been authorized for detection and/or diagnosis of SARS-CoV-2 by FDA under an Emergency Use Authorization (EUA). This EUA will remain  in effect (meaning this test can be used) for the duration of the COVID-19 declaration under Section 56 4(b)(1) of the Act, 21 U.S.C. section 360bbb-3(b)(1), unless the authorization is terminated or revoked sooner. Performed at [facility], [place] [address], [place], [place] [address]      Time coordinating discharge: 36  minutes  SIGNED:   Hosie Poisson, MD  [facility] 03/29/2019, 2:27 PM Pager   If 7PM-7AM, please contact night-coverage [email or web address] Password [email or web address]

## 2019-03-29 NOTE — TOC Transition Note (Signed)
Transition of Care [facility]) - CM/SW Discharge Note   Patient Details  Name: Amy Shepard MRN: [id number] Date of Birth: 10/10/1945  Transition of [facility]) CM/SW Contact:  Alberteen Sam, LCSW Phone Number: 03/29/2019, 2:35 PM   Clinical Narrative:     Patient will DC to: [facility] Anticipated DC date: 03/29/2019 Family notified: N/A - patient requests no family be contacted Transport [id number]  Per MD patient ready for DC to [facility] . RN, patient, patient's family, and facility notified of DC. Discharge Summary sent to facility. RN given number for report [phone]  . DC packet on chart. Ambulance transport requested for patient.  CSW signing off.  [place], [place]   Final next level of care: Skilled Nursing Facility Barriers to Discharge: No Barriers Identified   Patient Goals and CMS Choice Patient states their goals for this hospitalization and ongoing recovery are:: to go to rehab then home CMS Medicare.gov Compare Post Acute Care list provided to:: Patient Choice offered to / list presented to : Patient  Discharge Placement PASRR number recieved: 03/24/19            Patient chooses bed at: [facility] Patient to be transferred to facility by: [facility] Name of family member notified: N/A Patient and family notified of of transfer: 03/29/19  Discharge Plan and Services     Post Acute Care Choice: [facility]                               Social Determinants of Health (SDOH) Interventions     Readmission Risk Interventions Readmission Risk Prevention Plan 11/29/2018  Transportation Screening Complete  PCP or Specialist Appt within 3-5 Days Complete  HRI or [place] Complete  Social Work Consult for [place] Planning/Counseling Complete  Palliative Care Screening Not Applicable  Medication Review Press photographer) Complete  Some recent data might be hidden

## 2019-03-30 ENCOUNTER — Telehealth: Payer: Self-pay | Admitting: Orthopedic Surgery

## 2019-03-30 ENCOUNTER — Encounter: Payer: Self-pay | Admitting: Internal Medicine

## 2019-03-30 ENCOUNTER — Non-Acute Institutional Stay (SKILLED_NURSING_FACILITY): Payer: Medicare Other | Admitting: Internal Medicine

## 2019-03-30 DIAGNOSIS — J9621 Acute and chronic respiratory failure with hypoxia: Secondary | ICD-10-CM | POA: Diagnosis not present

## 2019-03-30 DIAGNOSIS — E118 Type 2 diabetes mellitus with unspecified complications: Secondary | ICD-10-CM

## 2019-03-30 DIAGNOSIS — S82891P Other fracture of right lower leg, subsequent encounter for closed fracture with malunion: Secondary | ICD-10-CM

## 2019-03-30 DIAGNOSIS — I951 Orthostatic hypotension: Secondary | ICD-10-CM

## 2019-03-30 DIAGNOSIS — Z794 Long term (current) use of insulin: Secondary | ICD-10-CM

## 2019-03-30 DIAGNOSIS — D649 Anemia, unspecified: Secondary | ICD-10-CM

## 2019-03-30 DIAGNOSIS — R42 Dizziness and giddiness: Secondary | ICD-10-CM

## 2019-03-30 NOTE — Progress Notes (Signed)
This is an acute visit.  Level of care skilled.  Facility is Doctor, hospital complaint acute visit status post hospitalization for orthostatic hypotension.  History of present illness.  Patient is a pleasant 73 year old female seen today for follow-up of hospitalization for dizziness and hypotension.  Patient has a prior history of hypertension hyperlipidemia diabetes stage III chronic kidney disease as well as coronary artery disease and diastolic CHF in addition to mild aortic stenosis and lung cancer status post right long superior segment ectomy she does have a history of COPD on chronic oxygen and numerous medications she also has no history of pulmonary hypertension.  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  The orthostatic hypotension was treated in the hospital was thought this was possibly secondary to dehydration-her MIDODRINE was increased to 7.5 mg 3 times daily  Regards to chronic respiratory failure on chronic oxygen with history of COPD and moderate pulmonary hypertension she has numerous medications her chest x-ray in the hospital was unchanged from her baseline.  At this point continue her medications including Proventil Atrovent Pulmicort Mucinex Xopenex Dulera Perforomist and Incruse Ellipta.  Regards her pulmonary hypertension she continues withopsumit and Slexipag--  Regards to diabetes she is on Levemir 20 units as well as Trulicity 1.5 mg a day and Humalog sliding scale at this point will monitor CBGs.  She also has a history of anemia of chronic disease she is on iron hemoglobin apparently has been stable it was 9.3 in the hospital will monitor this with an updated CBC within a week.  She also has a history of diastolic CHF and continues on Lasix 40 mg twice daily with potassium supplementation she does not really appear to have significant edema or complaints of  shortness of breath beyond baseline today.  She will need follow-up with cardiology.  She has a listed history of stage III chronic kidney disease this appears to be stable with creatinine of 1.14 on discharge.  In regards to history of lung cancer status post segmentectomy she is followed by oncology Dr. [name]  In regards to right ankle fracture with a new subacute lateral malleolar fracture she is status post ORIF this was followed by an ankle fusion- cHARCOTT COLLAPSE--and nonunion of ankle- CT did show loosening of the hardware- orthopedics recommended a patella tendon right double upright brace 50% partial weightbearing-she will have follow-up with orthopedics.  Currently she has no complaints she is sitting in her chair comfortably she says she did speak with her doctor's office and she has been on Neurontin twice a day it is currently listed at at bedtime will increase this to twice daily she says apparently with all her recent changes it probably was changed inadvertently.  She did discuss this apparently with her physician's office.  Past Medical History:  Diagnosis Date  . Anemia   . Anxiety   . Arthritis   . CAD (coronary artery disease)    a. Prior cath 2015 showed 40% prox AD, 50-50% mLAD, otherwise calcification but no obstruction in LCx/RCA.. Medical management recommended. b. 2017: low-risk NST.  [name] Chronic diastolic CHF (congestive heart failure) ([place])   . Chronic respiratory failure ([place])   . CKD (chronic kidney disease), stage III ([place])   . Complication of anesthesia    oxygen level dropped in last surgery 11/2018  . COPD (chronic obstructive pulmonary disease) ([place])   . 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 felt due to advanced COPD and noncompliance with O2.  . Depression   . Diabetes mellitus    [name]    dx  2008  . Fracture    left foot  . Hx of seasonal allergies   . Hypercholesteremia   . Hypertension   . Lung cancer ([place])   . On home oxygen  therapy    "2.5L; 24/7" (03/10/2017)  . Pericardial effusion    a. small-moderate in 07/2016.  [name] Pneumonia   . Pulmonary hypertension ([place])   . UTI (urinary tract infection)            Past Surgical History:  Procedure Laterality Date  . ANKLE FUSION Right 02/11/2019   Procedure: RIGHT ANKLE FUSION;  Surgeon: Newt Minion, MD;  Location: [facility];  Service: Orthopedics;  Laterality: Right;  . APPLICATION OF WOUND VAC Right 11/23/2018   Procedure: Application Of Wound Vac;  Surgeon: Newt Minion, MD;  Location: [facility];  Service: Orthopedics;  Laterality: Right;  . [place] or [facility] in [place]     bilateral cataracts  . HARDWARE REMOVAL Right 02/11/2019   Procedure: REMOVAL DEEP HARDWARE RIGHT ANKLE;  Surgeon: Newt Minion, MD;  Location: [facility];  Service: Orthopedics;  Laterality: Right;  . LEFT HEART CATHETERIZATION WITH CORONARY ANGIOGRAM N/A 07/12/2014   Procedure: LEFT HEART CATHETERIZATION WITH CORONARY ANGIOGRAM;  Surgeon: [name], MD;  Location: [facility] CATH LAB;  Service: Cardiovascular;  Laterality: N/A;  . MAXIMUM ACCESS (MAS)POSTERIOR LUMBAR INTERBODY FUSION (PLIF) 2 LEVEL N/A 02/22/2016   Procedure: Lumbar three-four - Lumbar four-five  MAXIMUM ACCESS SURGERY  POSTERIOR LUMBAR INTERBODY FUSION;  Surgeon: [name], MD;  Location: [facility] NEURO ORS;  Service: Neurosurgery;  Laterality: N/A;  . ORIF ANKLE FRACTURE Right 11/23/2018   Procedure: OPEN REDUCTION INTERNAL FIXATION (ORIF) RIGHT ANKLE FRACTURE;  Surgeon: Newt Minion, MD;  Location: [facility];  Service: Orthopedics;  Laterality: Right;  . ORIF TOE FRACTURE Left 06/12/2017   Procedure: OPEN REDUCTION INTERNAL FIXATION (ORIF) BASE 1ST METATARSAL (TOE) FRACTURE;  Surgeon: Newt Minion, MD;  Location: [facility];  Service: Orthopedics;  Laterality: Left;  . RIGHT HEART CATH N/A 08/06/2017   Procedure: RIGHT HEART CATH;  Surgeon: Larey Dresser, MD;   Location: [facility] CV LAB;  Service: Cardiovascular;  Laterality: N/A;  . RIGHT HEART CATH N/A 03/03/2019   Procedure: RIGHT HEART CATH;  Surgeon: Jolaine Artist, MD;  Location: [facility] CV LAB;  Service: Cardiovascular;  Laterality: N/A;  . THORACOTOMY Right 2010   lower  . TONSILLECTOMY        Social History:      Social History        Tobacco Use  . Smoking status: Former Smoker    Packs/day: 1.00    Years: 30.00    Pack years: 30.00    Quit date: 07/14/2006    Years since quitting: 12.6  . Smokeless tobacco: Never Used  Substance Use Topics  . Alcohol use: No       Family History :          Family History  Problem Relation Age of Onset  . Diabetes Father   . Heart failure Father   . Cancer Sister   . Diabetes Brother   . Heart failure Brother     MEDICATIONS    acetaminophen  325 MG tablet Commonly known as: TYLENOL Take 650 mg by mouth every 6 (six) hours as needed for moderate pain or fever.   albuterol (2.5 MG/3ML) 0.083% nebulizer solution Commonly known as: PROVENTIL Take 3 mLs (2.5 mg total) by nebulization every 4 (four) hours as needed for wheezing or shortness of breath.   atorvastatin 40 MG tablet Commonly known as: LIPITOR Take 1 tablet (40 mg total) by mouth daily.   azelastine 0.1 % nasal spray Commonly known as: ASTELIN Place 1 spray into both nostrils 2 (two) times daily. Use in each nostril as directed   budesonide 0.5 MG/2ML nebulizer solution Commonly known as: PULMICORT Take 2 mLs (0.5 mg total) by nebulization 2 (two) times daily.   docusate sodium 100 MG capsule Commonly known as: COLACE Take 1 capsule (100 mg total) by mouth 2 (two) times daily as needed for mild constipation.   furosemide 40 MG tablet Commonly known as: LASIX Take 1 tablet (40 mg total) by mouth 2 (two) times daily.   gabapentin 300 MG capsule Commonly known as: NEURONTIN Take 1 capsule (300 mg total) by mouth  at bedtime.   guaiFENesin 600 MG 12 hr tablet Commonly known as: MUCINEX Take 1 tablet (600 mg total) by mouth 2 (two) times daily.   insulin detemir 100 UNIT/ML injection Commonly known as: LEVEMIR Inject 0.2 mLs (20 Units total) into the skin at bedtime.   insulin lispro 100 UNIT/ML injection Commonly known as: HUMALOG Inject 2-11 Units into the skin See admin instructions. Inject 2-11 units into the skin three times a day before meals and at bedtime, per sliding scale   ipratropium 0.02 % nebulizer solution Commonly known as: ATROVENT Take 2.5 mLs (0.5 mg total) by nebulization 3 (three) times daily.   iron polysaccharides 150 MG capsule Commonly known as: NIFEREX Take 1 capsule (150 mg total) by mouth 2 (two) times daily.   levalbuterol 0.63 MG/3ML nebulizer solution Commonly known as: XOPENEX Take 3 mLs (0.63 mg total) by nebulization 3 (three) times daily.   macitentan 10 MG tablet Commonly known as: Opsumit Take 1 tablet (10 mg total) by mouth daily.   midodrine 2.5 MG tablet Commonly known as: PROAMATINE Take 3 tablets (7.5 mg total) by mouth 3 (three) times daily with meals. What changed:   medication strength  how much to take   mometasone-formoterol 200-5 MCG/ACT Aero Commonly known as: DULERA Inhale 2 puffs into the lungs 2 (two) times daily.   OXYGEN Inhale 4-5 L/min into the lungs continuous.   Perforomist 20 MCG/2ML nebulizer solution Generic drug: formoterol Take 2 mLs (20 mcg total) by nebulization 2 (two) times daily.   potassium chloride SA 20 MEQ tablet Commonly known as: K-DUR Take 1 tablet (20 mEq total) by mouth daily. Start taking on: March 30, 2019   sertraline 50 MG tablet Commonly known as: ZOLOFT Take 75 mg by mouth daily.   traMADol 50 MG tablet Commonly known as: ULTRAM Take 1 tablet (50 mg total) by mouth every 6 (six) hours as needed for moderate pain.   Trulicity 1.5 WU/1.3KG Sopn Generic drug:  Dulaglutide Inject 1.5 mg into the vein every Tuesday.   umeclidinium bromide 62.5 MCG/INH Aepb Commonly known as: INCRUSE ELLIPTA Inhale 1 puff into the lungs daily.   Uptravi 600 MCG Tabs Generic drug: Selexipag Take 600 mcg by mouth 2 (two) times daily.          --- Review of systems.  In general she is not complaining of  any fever chills.  Skin does not complain of rashes or itching.  Head ears eyes nose mouth and throat does not complain of visual changes or sore throat.  Respiratory has an extensive history as noted above but does not really complaining of any increased shortness of breath beyond baseline or increased coughing.  Cardiac does not complain of chest pain or increased edema from baseline.  GI does not complain of abdominal pain nausea vomiting diarrhea or constipation.  GU is not complaining of dysuria.  Musculoskeletal at this point is not complaining of joint pain she does have a history of right ankle issues as noted above and does have orders for Ultram 50 mg every 6 hours as needed for 2 weeks.  Neurologic is not complaining at this time of headache or syncope says her dizziness has improved.  And psych does not complain overtly of being depressed or anxious appears to be in good spirits.  Physical exam.  Temperature 97.9 pulse 80respirations 18 blood pressure 100/60  In general this is a pleasant elderly female in no distress sitting comfortably in her chair.  Her skin is warm and dry.  Eyes visual acuity appears to be intact sclera and conjunctive are clear.  Oropharynx is clear mucous membranes moist.  Chest is clear to auscultation with somewhat shallow air entry could not really appreciate congestion or labored breathing.  Heart is regular rate and rhythm without murmur gallop or rub.  She has minimal lower extremity edema  Abdomen is soft nontender somewhat obese with positive bowel sounds.  Musculoskeletal does move all  extremities x4 she does have brace applied to her right lower leg ankle area--appears to move her other extremities at baseline  Neurologic is grossly intact her speech is clear cannot really appreciate lateralizing findings.  Psych she is alert and oriented very pleasant and appropriate.  Labs.  March 29, 2019.  Sodium 139 potassium 3.5 BUN 45 creatinine 1.14.--Albumin of 3.1 otherwise liver function tests within normal limits  March 23, 2019.  WBC 5.5 hemoglobin 9.3 platelets 281.  Assessment plan.  1.  History of orthostatic hypotension-her Midodrine was increased to 7.5 mg 3 times daily- it was felt dizziness was probably also contributed to by dehydration- she says her dizziness has improved significantly at this point continue to monitor continue rehab.  2.-History of chronic respiratory failure continues on chronic oxygen with a history of COPD also complicated with moderate pulmonary hypertension continues on current medications as noted above including Atrovent Proventil Pulmicort Mucinex Xopenex Dulera Perforomist and Incruse Ellipta  #3 history of diabetes she is on Levemir 20 units a day as well as Trulicity 1.5 mg a day at this point will monitor.  4.  History of pulmonary hypertension she is on Lasix 40 mg twice daily and potassium 20 mEq a day will write an order to hold this if her systolic blood pressure is less than 100.  5.  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  6.  History of anemia she is on Niferex twice daily hemoglobin was 9.3 in the hospital will update this within the next week.  7.  History of diastolic CHF again she is on the Lasix 40 mg twice daily with potassium supplementation.  8.-History of coronary artery disease she is on a statin and followed by cardiology she  does have follow-up with  cardiology scheduled on September 21.  9.  History of lung carcinoma status place segment ectomy-she is followed by oncology Dr. [name]  #10 history of hypokalemia this was replaced in the hospital  Normalized it was 3.5 on lab done yesterday will update this within the next week.  11.  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 CT showed loosening of the hardware due to charcot ankle. Orthopedics recommends patella tendon right double upright AFO brace for 50% PWB . Will have follow-up with orthopedics  --- She will need continued PT and OT for strengthening she apparently has been quite independent previously.  We will need monitoring of her medical condition including respiratory status and diabetes and blood pressure-she appears to be doing well at this point.  [id number]--

## 2019-03-30 NOTE — Telephone Encounter (Signed)
Manuela Schwartz from [facility] left a message needing to get the patient's weight bearing status.  CB#[phone].  Thank you.

## 2019-03-31 NOTE — Telephone Encounter (Signed)
Called and lm on vm to advise of message below. To call with questions.

## 2019-03-31 NOTE — Telephone Encounter (Signed)
Weight bearing as tolerated with boot on

## 2019-03-31 NOTE — Telephone Encounter (Signed)
Pt s/p removal of hardware 02/11/19 SNF calling for wtb status. Pt has first post op appt in office on 04/05/19 please advise.

## 2019-04-01 ENCOUNTER — Encounter: Payer: Self-pay | Admitting: Internal Medicine

## 2019-04-01 ENCOUNTER — Non-Acute Institutional Stay (SKILLED_NURSING_FACILITY): Payer: Medicare Other | Admitting: Internal Medicine

## 2019-04-01 DIAGNOSIS — E1142 Type 2 diabetes mellitus with diabetic polyneuropathy: Secondary | ICD-10-CM

## 2019-04-01 DIAGNOSIS — I10 Essential (primary) hypertension: Secondary | ICD-10-CM

## 2019-04-01 DIAGNOSIS — S82891P Other fracture of right lower leg, subsequent encounter for closed fracture with malunion: Secondary | ICD-10-CM

## 2019-04-01 DIAGNOSIS — I951 Orthostatic hypotension: Secondary | ICD-10-CM | POA: Diagnosis not present

## 2019-04-01 DIAGNOSIS — R42 Dizziness and giddiness: Secondary | ICD-10-CM | POA: Diagnosis not present

## 2019-04-01 NOTE — Progress Notes (Signed)
: Provider:  Hennie Duos., MD Location:  [facility] [place] Room Number: 112-P Place of Service:  SNF ([phone])  PCP: Hennie Duos, MD Patient Care Team: Hennie Duos, MD as PCP - General (Internal Medicine) Larey Dresser, MD as PCP - Advanced Heart Failure (Cardiology) Newt Minion, MD as Consulting Physician (Orthopedic Surgery)  Extended Emergency Contact Information Primary Emergency Contact: Berton Lan [place] Mobile Phone: [phone] Relation: Daughter     Allergies: Amoxicillin and Doxycycline  Chief Complaint  Patient presents with   New Admit To SNF    New admission to [facility] SNF    HPI: Patient is a 73 y.o. female with hypertension, hyperlipidemia, diabetes mellitus, CKD stage III, CAD, diastolic congestive heart failure, mild aortic stenosis, lung cancer status post right lower lobe superior segment ectomy, COPD on 5 L of nasal cannula O2, pulmonary hypertension, history of orthostatic hypotension and recent admission for right ankle fusion on 02/11/2019 due to Charcot collapse, nonunion of right ankle fracture who presented with dizziness for 5 days.  Patient was admitted to [facility] from 9/8[facility]15 where she was worked up for dizziness.  Her dizziness resolved.  All of her other medical problems were stable.  Patient is admitted to skilled nursing facility for OT/PT.  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  Past Medical History:  Diagnosis Date   Anemia    Anxiety    Arthritis    CAD (coronary artery disease)    a. Prior cath 2015 showed 40% prox AD, 50-50% mLAD, otherwise calcification but no obstruction in LCx/RCA.. Medical management recommended. b. 2017: low-risk NST.   Chronic diastolic CHF (congestive heart failure) ([name])    Chronic  respiratory failure ([name])    CKD (chronic kidney disease), stage III ([name])    Complication of anesthesia    oxygen level dropped in last surgery 11/2018   COPD (chronic obstructive pulmonary disease) ([name])    Cor pulmonale ([name])    a. felt due to advanced COPD and noncompliance with O2.   Depression    Diabetes mellitus    januvia    dx  2008   Fracture    left foot   Hx of seasonal allergies    Hypercholesteremia    Hypertension    Lung cancer ([place])    On home oxygen therapy    "2.5L; 24/7" (03/10/2017)   Pericardial effusion    a. small-moderate in 07/2016.   Pneumonia    Pulmonary hypertension ([place])    UTI (urinary tract infection)     Past Surgical History:  Procedure Laterality Date   ANKLE FUSION Right 02/11/2019   Procedure: RIGHT ANKLE FUSION;  Surgeon: Newt Minion, MD;  Location: [place];  Service: Orthopedics;  Laterality: Right;   APPLICATION OF WOUND VAC Right 11/23/2018   Procedure: Application Of Wound Vac;  Surgeon: Newt Minion, MD;  Location: [place];  Service: Orthopedics;  Laterality: Right;   CARDIAC CATHETERIZATION     1995 or 65  [facility] [place]     bilateral cataracts   HARDWARE REMOVAL Right 02/11/2019   Procedure: REMOVAL DEEP HARDWARE RIGHT ANKLE;  Surgeon: Newt Minion, MD;  Location: [place];  Service: Orthopedics;  Laterality: Right;   LEFT HEART CATHETERIZATION WITH CORONARY ANGIOGRAM N/A 07/12/2014  Procedure: LEFT HEART CATHETERIZATION WITH CORONARY ANGIOGRAM;  Surgeon: [name], MD;  Location: [facility] CATH LAB;  Service: Cardiovascular;  Laterality: N/A;   MAXIMUM ACCESS (MAS)POSTERIOR LUMBAR INTERBODY FUSION (PLIF) 2 LEVEL N/A 02/22/2016   Procedure: Lumbar three-four - Lumbar four-five  MAXIMUM ACCESS SURGERY  POSTERIOR LUMBAR INTERBODY FUSION;  Surgeon: [name], MD;  Location: [place] NEURO ORS;  Service: Neurosurgery;  Laterality: N/A;   ORIF ANKLE FRACTURE Right 11/23/2018   Procedure:  OPEN REDUCTION INTERNAL FIXATION (ORIF) RIGHT ANKLE FRACTURE;  Surgeon: Newt Minion, MD;  Location: [place];  Service: Orthopedics;  Laterality: Right;   ORIF TOE FRACTURE Left 06/12/2017   Procedure: OPEN REDUCTION INTERNAL FIXATION (ORIF) BASE 1ST METATARSAL (TOE) FRACTURE;  Surgeon: Newt Minion, MD;  Location: [place];  Service: Orthopedics;  Laterality: Left;   RIGHT HEART CATH N/A 08/06/2017   Procedure: RIGHT HEART CATH;  Surgeon: Larey Dresser, MD;  Location: [place] CV LAB;  Service: Cardiovascular;  Laterality: N/A;   RIGHT HEART CATH N/A 03/03/2019   Procedure: RIGHT HEART CATH;  Surgeon: Jolaine Artist, MD;  Location: [place] CV LAB;  Service: Cardiovascular;  Laterality: N/A;   THORACOTOMY Right 2010   lower   TONSILLECTOMY      Allergies as of 04/01/2019      Reactions   Amoxicillin Anaphylaxis, Hives, Rash, Other (See Comments)   Has patient had a PCN reaction causing immediate rash, facial/tongue/throat swelling, SOB or lightheadedness with hypotension: YES Positive reaction causing SEVERE RASH INVOLVING MUCUS MEMBRANES/SKIN NECROSIS: YES Reaction that required HOSPITALIZATION: YES Reaction occurring within the last 10 years: NO   Doxycycline Diarrhea      Medication List       Accurate as of April 01, 2019 12:02 PM. If you have any questions, ask your nurse or doctor.        acetaminophen 325 MG tablet Commonly known as: TYLENOL Take 650 mg by mouth every 6 (six) hours as needed for moderate pain or fever.   albuterol (2.5 MG/3ML) 0.083% nebulizer solution Commonly known as: PROVENTIL Take 3 mLs (2.5 mg total) by nebulization every 4 (four) hours as needed for wheezing or shortness of breath.   atorvastatin 40 MG tablet Commonly known as: LIPITOR Take 1 tablet (40 mg total) by mouth daily.   azelastine 0.1 % nasal spray Commonly known as: ASTELIN Place 1 spray into both nostrils 2 (two) times daily. Use in each nostril as directed     budesonide 0.5 MG/2ML nebulizer solution Commonly known as: PULMICORT Take 2 mLs (0.5 mg total) by nebulization 2 (two) times daily.   docusate sodium 100 MG capsule Commonly known as: COLACE Take 1 capsule (100 mg total) by mouth 2 (two) times daily as needed for mild constipation.   furosemide 40 MG tablet Commonly known as: LASIX Take 1 tablet (40 mg total) by mouth 2 (two) times daily.   gabapentin 300 MG capsule Commonly known as: NEURONTIN Take 300 mg by mouth 2 (two) times daily. What changed: Another medication with the same name was removed. Continue taking this medication, and follow the directions you see here. Changed by: Inocencio Homes, MD   guaiFENesin 600 MG 12 hr tablet Commonly known as: MUCINEX Take 1 tablet (600 mg total) by mouth 2 (two) times daily.   insulin detemir 100 UNIT/ML injection Commonly known as: LEVEMIR Inject 0.2 mLs (20 Units total) into the skin at bedtime.   insulin lispro 100 UNIT/ML injection Commonly  known as: HUMALOG Inject 2-15 Units into the skin See admin instructions. Inject 2-15 units into the skin three times a day before meals and at bedtime, per sliding scale. 121-150 = 2 units 151-200 = 3 units 201-250 = 5 units 251-300 = 8 units 301-350 = 11 units 351-400 = 15 units Greater than 400 call MD   ipratropium 0.02 % nebulizer solution Commonly known as: ATROVENT Take 2.5 mLs (0.5 mg total) by nebulization 3 (three) times daily.   iron polysaccharides 150 MG capsule Commonly known as: NIFEREX Take 1 capsule (150 mg total) by mouth 2 (two) times daily.   levalbuterol 0.63 MG/3ML nebulizer solution Commonly known as: XOPENEX Take 3 mLs (0.63 mg total) by nebulization 3 (three) times daily.   macitentan 10 MG tablet Commonly known as: Opsumit Take 1 tablet (10 mg total) by mouth daily.   midodrine 2.5 MG tablet Commonly known as: PROAMATINE Take 3 tablets (7.5 mg total) by mouth 3 (three) times daily with meals.    mometasone-formoterol 200-5 MCG/ACT Aero Commonly known as: DULERA Inhale 2 puffs into the lungs 2 (two) times daily.   OXYGEN Inhale 4-5 L/min into the lungs continuous.   Perforomist 20 MCG/2ML nebulizer solution Generic drug: formoterol Take 2 mLs (20 mcg total) by nebulization 2 (two) times daily.   potassium chloride SA 20 MEQ tablet Commonly known as: K-DUR Take 1 tablet (20 mEq total) by mouth daily.   sertraline 50 MG tablet Commonly known as: ZOLOFT Take 75 mg by mouth daily.   traMADol 50 MG tablet Commonly known as: ULTRAM Take 1 tablet (50 mg total) by mouth every 6 (six) hours as needed for moderate pain.   Trulicity 1.5 XH/3.7JI Sopn Generic drug: Dulaglutide Inject 1.5 mg into the vein every Tuesday.   umeclidinium bromide 62.5 MCG/INH Aepb Commonly known as: INCRUSE ELLIPTA Inhale 1 puff into the lungs daily.   Uptravi 600 MCG Tabs Generic drug: Selexipag Take 600 mcg by mouth 2 (two) times daily.       No orders of the defined types were placed in this encounter.   There is no immunization history for the selected administration types on file for this patient.  Social History   Tobacco Use   Smoking status: Former Smoker    Packs/day: 1.00    Years: 30.00    Pack years: 30.00    Quit date: 07/14/2006    Years since quitting: 12.7   Smokeless tobacco: Never Used  Substance Use Topics   Alcohol use: No    Family history is   Family History  Problem Relation Age of Onset   Diabetes Father    Heart failure Father    Cancer Sister    Diabetes Brother    Heart failure Brother       Review of Systems  DATA OBTAINED: from patient GENERAL:  no fevers, fatigue, appetite changes SKIN: No itching, or rash EYES: No eye pain, redness, discharge EARS: No earache, tinnitus, change in hearing NOSE: No congestion, drainage or bleeding  MOUTH/THROAT: No mouth or tooth pain, No sore throat RESPIRATORY: No cough, wheezing,  SOB CARDIAC: No chest pain, palpitations, lower extremity edema  GI: No abdominal pain, No N/V/D or constipation, No heartburn or reflux  GU: No dysuria, frequency or urgency, or incontinence  MUSCULOSKELETAL: No unrelieved bone/joint pain NEUROLOGIC: No headache, dizziness or focal weakness PSYCHIATRIC: No c/o anxiety or sadness   Vitals:   04/01/19 1138  BP: 102/60  Pulse: 73  Resp:  18  Temp: (!) 97.2 F (36.2 C)  SpO2: 95%    SpO2 Readings from Last 1 Encounters:  04/01/19 95%   Body mass index is 31.18 kg/m.     Physical Exam  GENERAL APPEARANCE: Alert, conversant,  No acute distress.  SKIN: No diaphoresis rash HEAD: Normocephalic, atraumatic  EYES: Conjunctiva/lids clear. Pupils round, reactive. EOMs intact.  EARS: External exam WNL, canals clear. Hearing grossly normal.  NOSE: No deformity or discharge.  MOUTH/THROAT: Lips w/o lesions  RESPIRATORY: Breathing is even, unlabored. Lung sounds are clear   CARDIOVASCULAR: Heart RRR no murmurs, rubs or gallops. No peripheral edema.   GASTROINTESTINAL: Abdomen is soft, non-tender, not distended w/ normal bowel sounds. GENITOURINARY: Bladder non tender, not distended  MUSCULOSKELETAL: No abnormal joints or musculature NEUROLOGIC:  Cranial nerves 2-12 grossly intact. 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, NYHA class  II and CKD stage III ([place])    Diabetes mellitus type 2 in obese ([place])    Supplemental oxygen dependent    Trimalleolar fracture of ankle, closed, right, sequela 12/01/2018   Hypoxia    Acute on chronic anemia    Closed dislocation of right talus    Acute on chronic postoperative respiratory failure ([name])    Postprocedural hypotension    Trimalleolar fracture of ankle, closed, right, initial encounter    Syndesmotic disruption of ankle, right, initial encounter    Ankle fracture 11/22/2018   Pain in joint, ankle and foot 04/10/2018   Closed left fibular fracture 04/10/2018   CKD (chronic kidney disease), stage IV ([place]) 04/09/2018   Near syncope 04/09/2018   Dyspnea    Bronchitis, acute 03/11/2018   Fatigue 03/11/2018   Anemia due to stage 4 chronic kidney disease ([place]) 03/11/2018   Pulmonary fibrosis ([place]) 03/11/2018   Pulmonary hypertension, unspecified ([place]) 03/11/2018   Anemia 03/11/2018   Lisfranc dislocation, left, subsequent encounter 06/08/2017   Chronic respiratory failure with hypoxia ([place]) 06/03/2017   Falls 06/03/2017   Hypokalemia 06/03/2017   Sepsis secondary to UTI ([place]) 06/02/2017   Dog bite 05/02/2017   CHF exacerbation ([place]) 93/90/3009   Acute diastolic CHF (congestive heart failure) ([place]) 05/02/2017   Chronic diastolic CHF (congestive heart failure) ([place]) 04/02/2017   RVF (right ventricular failure) ([place]) 04/02/2017   Acute on chronic respiratory failure ([place]) 03/10/2017   Chronic pain 03/10/2017   Lactic acidosis 02/10/2017   Acute respiratory failure with hypoxia ([place]) 02/09/2017   Acute on chronic diastolic CHF (congestive heart failure) ([place]) 06/23/2016   HTN (hypertension) 06/23/2016   Type 2 diabetes mellitus with complication, with long-term current use of insulin ([place]) 03/11/2016   Depression 03/11/2016   Chronic obstructive pulmonary disease ([place]) 03/10/2016   S/P lumbar spinal fusion 02/22/2016   Coronary  artery disease involving native coronary artery 07/12/2014   Pain in the chest    Malignant neoplasm of lower lobe of  right lung ([place]) 05/25/2014   Lung cancer ([place]) 09/01/2012      Labs reviewed: Basic Metabolic Panel:    Component Value Date/Time   NA 139 03/29/2019 0705   NA 137 04/02/2017 1535   NA 136 11/30/2015 0758   K 3.5 03/29/2019 0705   K 4.2 11/30/2015 0758   CL 97 (L) 03/29/2019 0705   CL 104 08/30/2012 0908   CO2 29 03/29/2019 0705   CO2 27 11/30/2015 0758   GLUCOSE 255 (H) 03/29/2019 0705   GLUCOSE 339 (H) 11/30/2015 0758   GLUCOSE 191 (H) 08/30/2012 0908   BUN 45 (H) 03/29/2019 0705   BUN 27 04/02/2017 1535   BUN 19.0 11/30/2015 0758   CREATININE 1.14 (H) 03/29/2019 0705   CREATININE 1.06 (H) 01/04/2019 1002   CREATININE 0.77 03/28/2016 1504   CREATININE 1.2 (H) 11/30/2015 0758   CALCIUM 9.4 03/29/2019 0705   CALCIUM 10.3 11/30/2015 0758   PROT 6.1 (L) 03/23/2019 0824   PROT 6.8 05/14/2017 1201   PROT 7.3 11/30/2015 0758   ALBUMIN 3.1 (L) 03/23/2019 0824   ALBUMIN 3.7 05/14/2017 1201   ALBUMIN 3.7 11/30/2015 0758   AST 18 03/23/2019 0824   AST 19 01/04/2019 1002   AST 13 11/30/2015 0758   ALT 15 03/23/2019 0824   ALT 9 01/04/2019 1002   ALT 16 11/30/2015 0758   ALKPHOS 105 03/23/2019 0824   ALKPHOS 112 11/30/2015 0758   BILITOT 0.5 03/23/2019 0824   BILITOT 0.2 (L) 01/04/2019 1002   BILITOT 0.39 11/30/2015 0758   GFRNONAA 48 (L) 03/29/2019 0705   GFRNONAA 52 (L) 01/04/2019 1002   GFRAA 56 (L) 03/29/2019 0705   GFRAA >60 01/04/2019 1002    Recent Labs    02/28/19 0234  03/01/19 0841 03/02/19 0213  03/09/19 0205 03/10/19 0200 03/11/19 0234  03/27/19 0522 03/28/19 0653 03/29/19 0705  NA 137   < >  --  138   < > 139 139  --    < > 140 139 139  K 4.0   < >  --  3.9   < > 4.7 4.5  --    < > 3.6 3.4* 3.5  CL 96*   < >  --  99   < > 96* 99  --    < > 100 100 97*  CO2 31   < >  --  29   < > 33* 30  --    < > _0 GLUCOSE 281*   < >   --  226*   < > 187* 65*  --    < > 148* 172* 255*  BUN 59*   < >  --  58*   < > 56* 51*  --    < > 38* 43* 45*  CREATININE 1.30*   < >  --  1.24*   < > 1.23* 1.37*  --    < > 1.16* 1.18* 1.14*  CALCIUM 8.8*   < >  --  8.8*   < > 9.2 8.9  --    < > 9.3 8.9 9.4  MG 2.3  --  2.3 2.3   < > 2.4 2.5* 2.5*  --   --   --   --   PHOS 3.5  --  4.3 4.0  --   --   --   --   --   --   --   --    < > =  values in this interval not displayed.   Liver Function Tests: Recent Labs    03/02/19 0213 03/22/19 1654 03/23/19 0824  AST _0 ALT 36 14 15  ALKPHOS 106 104 105  BILITOT 0.4 0.8 0.5  PROT 5.7* 6.1* 6.1*  ALBUMIN 3.1* 3.2* 3.1*   Recent Labs    08/18/18 1650  LIPASE 24   No results for input(s): AMMONIA in the last 8760 hours. CBC: Recent Labs    03/01/19 0841 03/02/19 0213  03/03/19 1407 03/22/19 1654 03/23/19 0824  WBC 12.9* 11.3*  --   --  5.8 5.5  NEUTROABS 11.0* 9.4*  --   --  4.3  --   HGB 9.6* 9.0*   < > 8.8* 9.6* 9.3*  HCT 32.0* 29.1*   < > 26.0* 31.4* 30.3*  MCV 82.7 82.2  --   --  87.2 87.1  PLT 249 218  --   --  290 288   < > = values in this interval not displayed.   Lipid No results for input(s): CHOL, HDL, LDLCALC, TRIG in the last 8760 hours.  Cardiac Enzymes: Recent Labs    04/09/18 1749  TROPONINI <0.03   BNP: Recent Labs    04/09/18 1749 02/13/19 1805 02/24/19 0325  BNP 297.8* 1,211.6* 1,344.9*   No results found for: [facility] Lab Results  Component Value Date   HGBA1C 6.9 (H) 02/25/2019   Lab Results  Component Value Date   TSH 3.930 04/10/2018   Lab Results  Component Value Date   ESLPNPYY51 102 02/24/2019   Lab Results  Component Value Date   FOLATE 10.8 02/24/2019   Lab Results  Component Value Date   IRON 59 02/24/2019   TIBC 414 02/24/2019   FERRITIN 20 02/24/2019    Imaging and Procedures obtained prior to SNF admission: Dg Chest 2 View  Result Date: 03/22/2019 CLINICAL DATA:  Dizziness EXAM: CHEST - 2 VIEW  COMPARISON:  03/02/2019 FINDINGS: Stable cardiomediastinal contours. Aorta is calcified. No focal airspace consolidation, pleural effusion, or pneumothorax. IMPRESSION: No active cardiopulmonary disease. Electronically Signed   By: Davina Poke M.D.   On: 03/22/2019 16:14   Dg Ankle Complete Right  Result Date: 03/22/2019 CLINICAL DATA:  Recent right ankle ORIF. EXAM: RIGHT ANKLE - COMPLETE 3+ VIEW COMPARISON:  Right ankle x-rays dated February 07, 2019. FINDINGS: New subacute transverse fracture of the lateral malleolus with slight lateral displacement. Progressive callus formation surrounding the subacute distal fibular diaphyseal fracture. Subacute, healing nondisplaced fracture of the medial malleolus with increased surrounding callus formation. Subacute, mildly displaced fracture of the posterior malleolus with increased callus formation. Mild lateral subluxation of the talus with respect to the tibial plafond. No dislocation. Interval removal of the fibular plate and screw construct, as well as the medial malleolar screw. Interval tibiotalar joint fusion with anterior plate and screw construct. No definite bridging bone. There is loosening and slight backing out of both talar head screws. Unchanged midfoot osteoarthritis. Osteopenia. Diffuse soft tissue swelling about the ankle. IMPRESSION: 1. New subacute transverse fracture of the lateral malleolus. 2. Healing medial and posterior malleolar fractures. Healing distal fibular diaphyseal fracture. 3. Interval tibiotalar joint fusion with anterior plate and screw construct. Loosening of both talar head screws. Electronically Signed   By: Titus Dubin M.D.   On: 03/22/2019 16:22   Ct Head Wo Contrast  Result Date: 03/22/2019 CLINICAL DATA:  Vertigo episodic peripheral EXAM: CT HEAD WITHOUT CONTRAST TECHNIQUE: Contiguous axial images were obtained from  the base of the skull through the vertex without intravenous contrast. COMPARISON:  CT head 03/11/2018  FINDINGS: Brain: Mild atrophy. Patchy white matter hypodensity bilaterally appears stable from the prior study. No acute infarct, hemorrhage, mass. Vascular: Negative for hyperdense vessel Skull: Negative Sinuses/Orbits: Paranasal sinuses clear. Right mastoid effusion. Bilateral cataract surgery. Other: None IMPRESSION: No acute abnormality no change from the prior study. Stable atrophy and chronic microvascular ischemic change in the white matter. Electronically Signed   By: Franchot Gallo M.D.   On: 03/22/2019 16:20   Ct Ankle Right Wo Contrast  Result Date: 03/23/2019 CLINICAL DATA:  Recent right ankle fusion now with right fibular fracture. Assess tibiotalar fusion for displacement EXAM: CT OF THE RIGHT ANKLE WITHOUT CONTRAST TECHNIQUE: Multidetector CT imaging of the right ankle was performed according to the standard protocol. Multiplanar CT image reconstructions were also generated. COMPARISON:  Plain films 03/22/2019. FINDINGS: There has been interval removal of distal fibular hardware since plain films from 02/07/2019. Removal of the medial malleolar screw also noted. Placement of a plate and screw fixation device across the tibiotalar joint anteriorly. There is displaced fractures through the medial malleolus and posterior malleolus of the tibia. Healing distal fibular shaft fracture with callus formation. Fracture line remains visible. There is a new fracture in the distal fibula which may be at the site of prior distal screw. There is irregularity noted within the bone in this area suggesting the possibility of infection. There is large lucency noted in the distal tibia at the site of prior syndesmotic screw. This appears larger within the tibia than expected after screw removal and may be related to infection. Lucency in the talar dome noted the screw which enters from the tibia into the talar dome concerning for infection. IMPRESSION: Removal of prior hardware. Displaced medial malleolar and  posterior malleolar fractures noted. Healing distal fibular shaft fracture. Fracture line remains evident with exuberant callus. There is a new fracture in the distal fibula, likely at the site of the previous distal most screw. The bones irregular in this area. This could be related to infection. Lucency in the distal tibia at the site of previous syndesmotic screw which appears larger than expected following screw removal and may be related to infection. Lucency around the talar dome screw concerning for infection. Electronically Signed   By: Rolm Baptise M.D.   On: 03/23/2019 00:58     Not all labs, radiology exams or other studies done during hospitalization come through on my EPIC note; however they are reviewed by me.    Assessment and Plan  Dizziness- CT brain-no acute abnormalities, stable at atrophy and chronic microvascular ischemic changes; felt secondary to dehydration and orthostatic hypotension which patient is on Midodrine SNF-continue midodrine 7.5 mg 3 times daily  Hypertension SNF- continue Lasix 40 mg twice daily  Hyperlipidemia SNF-not stated as uncontrolled; continue Lipitor 40 mg daily  Diabetic neuropathy of bilateral hands SNF-patient's Neurontin was increased from 300 mg nightly to 300 mg twice daily which is the limit based on her creatinine  Right ankle fracture-new subacute lateral malleolar fracture status post ORIF followed by ankle fusion- surgical collapse and nonunion of ankle fracture-CT showed loosening of the hardware due to Charcot ankle; Ortho recommends patella tendon right double upright AFO brace or 50% PWB SNF-outpatient follow-up with orthopedics   Time spent 45 minutes;> 50% of time with patient was spent reviewing records, labs, tests and studies, counseling and developing plan of care  Hennie Duos, MD

## 2019-04-02 ENCOUNTER — Encounter: Payer: Self-pay | Admitting: Internal Medicine

## 2019-04-02 DIAGNOSIS — E1140 Type 2 diabetes mellitus with diabetic neuropathy, unspecified: Secondary | ICD-10-CM | POA: Insufficient documentation

## 2019-04-04 ENCOUNTER — Ambulatory Visit (HOSPITAL_COMMUNITY)
Admit: 2019-04-04 | Discharge: 2019-04-04 | Disposition: A | Discharge status: Home / Self Care | Payer: Medicare Other | Source: Ambulatory Visit | Attending: Cardiology | Admitting: Cardiology

## 2019-04-04 ENCOUNTER — Encounter (HOSPITAL_COMMUNITY): Payer: Self-pay | Admitting: Cardiology

## 2019-04-04 ENCOUNTER — Other Ambulatory Visit: Payer: Self-pay

## 2019-04-04 VITALS — BP 124/76 | HR 75 | Wt 166.0 lb

## 2019-04-04 DIAGNOSIS — I951 Orthostatic hypotension: Secondary | ICD-10-CM | POA: Insufficient documentation

## 2019-04-04 DIAGNOSIS — I2781 Cor pulmonale (chronic): Secondary | ICD-10-CM

## 2019-04-04 DIAGNOSIS — I272 Pulmonary hypertension, unspecified: Secondary | ICD-10-CM | POA: Insufficient documentation

## 2019-04-04 DIAGNOSIS — Z79899 Other long term (current) drug therapy: Secondary | ICD-10-CM | POA: Diagnosis not present

## 2019-04-04 DIAGNOSIS — E1122 Type 2 diabetes mellitus with diabetic chronic kidney disease: Secondary | ICD-10-CM | POA: Diagnosis not present

## 2019-04-04 DIAGNOSIS — Z8249 Family history of ischemic heart disease and other diseases of the circulatory system: Secondary | ICD-10-CM | POA: Diagnosis not present

## 2019-04-04 DIAGNOSIS — I2510 Atherosclerotic heart disease of native coronary artery without angina pectoris: Secondary | ICD-10-CM | POA: Diagnosis not present

## 2019-04-04 DIAGNOSIS — Z794 Long term (current) use of insulin: Secondary | ICD-10-CM | POA: Insufficient documentation

## 2019-04-04 DIAGNOSIS — M542 Cervicalgia: Secondary | ICD-10-CM | POA: Diagnosis not present

## 2019-04-04 DIAGNOSIS — J9611 Chronic respiratory failure with hypoxia: Secondary | ICD-10-CM | POA: Insufficient documentation

## 2019-04-04 DIAGNOSIS — N183 Chronic kidney disease, stage 3 (moderate): Secondary | ICD-10-CM | POA: Insufficient documentation

## 2019-04-04 DIAGNOSIS — E785 Hyperlipidemia, unspecified: Secondary | ICD-10-CM | POA: Insufficient documentation

## 2019-04-04 DIAGNOSIS — I130 Hypertensive heart and chronic kidney disease with heart failure and stage 1 through stage 4 chronic kidney disease, or unspecified chronic kidney disease: Secondary | ICD-10-CM | POA: Diagnosis not present

## 2019-04-04 DIAGNOSIS — Z9981 Dependence on supplemental oxygen: Secondary | ICD-10-CM | POA: Diagnosis not present

## 2019-04-04 DIAGNOSIS — I5032 Chronic diastolic (congestive) heart failure: Secondary | ICD-10-CM | POA: Diagnosis present

## 2019-04-04 DIAGNOSIS — I2721 Secondary pulmonary arterial hypertension: Secondary | ICD-10-CM | POA: Diagnosis not present

## 2019-04-04 DIAGNOSIS — J449 Chronic obstructive pulmonary disease, unspecified: Secondary | ICD-10-CM | POA: Diagnosis not present

## 2019-04-04 LAB — BASIC METABOLIC PANEL
Anion gap: 12 (ref 5–15)
BUN: 34 mg/dL — ABNORMAL HIGH (ref 8–23)
CO2: 28 mmol/L (ref 22–32)
Calcium: 9.3 mg/dL (ref 8.9–10.3)
Chloride: 99 mmol/L (ref 98–111)
Creatinine, Ser: 1.12 mg/dL — ABNORMAL HIGH (ref 0.44–1.00)
GFR calc Af Amer: 57 mL/min — ABNORMAL LOW (ref >60)
GFR calc non Af Amer: 49 mL/min — ABNORMAL LOW (ref >60)
Glucose, Bld: 167 mg/dL — ABNORMAL HIGH (ref 70–99)
Potassium: 4.3 mmol/L (ref 3.5–5.1)
Sodium: 139 mmol/L (ref 135–145)

## 2019-04-04 MED ORDER — SELEXIPAG 400 MCG PO TABS: 400.0000 ug | ORAL_TABLET | Freq: Two times a day (BID) | ORAL | 3 refills | Status: DC

## 2019-04-04 NOTE — Patient Instructions (Signed)
Decrease selexipag to 438mg twice daily.  Routine lab work today. Will notify you of abnormal results  Follow up in 1 month.

## 2019-04-05 ENCOUNTER — Encounter: Payer: Self-pay | Admitting: Family

## 2019-04-05 ENCOUNTER — Ambulatory Visit: Payer: Medicare Other

## 2019-04-05 ENCOUNTER — Ambulatory Visit (INDEPENDENT_AMBULATORY_CARE_PROVIDER_SITE_OTHER): Payer: Medicare Other | Admitting: Family

## 2019-04-05 DIAGNOSIS — M25511 Pain in right shoulder: Secondary | ICD-10-CM | POA: Diagnosis not present

## 2019-04-05 DIAGNOSIS — G8929 Other chronic pain: Secondary | ICD-10-CM | POA: Diagnosis not present

## 2019-04-05 MED ORDER — LIDOCAINE HCL 1 % IJ SOLN
5.0000 mL | INTRAMUSCULAR | Status: AC | PRN
  Administered 2019-04-05: 5 mL

## 2019-04-05 MED ORDER — METHYLPREDNISOLONE ACETATE 40 MG/ML IJ SUSP
40.0000 mg | INTRAMUSCULAR | Status: AC | PRN
  Administered 2019-04-05: 14:00:00 40 mg via INTRA_ARTICULAR

## 2019-04-05 NOTE — Progress Notes (Signed)
Office Visit Note   Patient: Amy Shepard           Date of Birth: 1945/11/21           MRN: [id number] Visit Date: 04/05/2019              Requested by: Maurice Small, MD [place],  [place] [address] PCP: Hennie Duos, MD  Chief Complaint  Patient presents with  . Right Ankle - Follow-up      HPI: The patient is a 73 year old woman seen today in follow-up she had removal of fusion hardware on July 3.  She has been doing quite well full weightbearing in a cam walker.  States her double upright brace is currently being fabricated but is not yet ready for her to pick up.  She is in a wheelchair today for ambulation.  Wonders if she can wear compression garments she does have a dry dressing around her ankle today.  The patient also is complaining of right-sided shoulder pain.  She has anterior shoulder pain pain that radiates down her bicep some pain and burning pain in the back of her arm.  There is no associated weakness.  She does have loss of range of motion difficulty reaching above her head due to pain.  No difficulty reaching across her chest.  Assessment & Plan: Visit Diagnoses:  1. Chronic right shoulder pain     Plan: She may advance her weightbearing from her cam walker into her elbow upright brace when she has obtained this.  She will continue to closely monitor her incision the medial ankle is nearly fully healed.  Depo-Medrol injection of the right shoulder today she will follow-up in the office in 3 to 4 weeks.  Consider radiographs of the C-spine at follow-up if continues to have pain  Follow-Up Instructions: Return in about 3 weeks (around 04/26/2019), or if symptoms worsen or fail to improve.   Right Ankle Exam   Tenderness  The patient is experiencing no tenderness. Swelling: mild  Other  Scars: present   Comments:  Lateral ankle incision well-healed medial ankle incision has the distal 1 cm in length but has not yet  healed 2 mm in width filled in with granulation there is no drainage no surrounding erythema no odor no sign of infection   Right Shoulder Exam   Tenderness  The patient is experiencing tenderness in the biceps tendon.  Range of Motion  Active abduction: 80  Passive abduction: normal   Tests  Impingement: positive Drop arm: negative  Other  Erythema: absent      Patient is alert, oriented, no adenopathy, well-dressed, normal affect, normal respiratory effort.   Imaging: Xr Shoulder Right  Result Date: 04/05/2019 Radiographs of right shoulder show some degenerative changes. No acute finding.  No images are attached to the encounter.  Labs: Lab Results  Component Value Date   HGBA1C 6.9 (H) 02/25/2019   HGBA1C 6.9 (H) 02/11/2019   HGBA1C 7.2 (H) 04/11/2018   ESRSEDRATE 55 (H) 03/23/2019   ESRSEDRATE 83 (H) 04/13/2018   ESRSEDRATE 37 (H) 08/13/2016   CRP <0.8 03/23/2019   CRP 0.3 (L) 09/11/2016   REPTSTATUS 03/23/2019 FINAL 03/22/2019   GRAMSTAIN  02/11/2019    RARE WBC PRESENT, PREDOMINANTLY PMN NO ORGANISMS SEEN    CULT (A) 03/22/2019    30,000 COLONIES/mL GROUP B STREP(S.AGALACTIAE)ISOLATED TESTING AGAINST S. AGALACTIAE NOT ROUTINELY PERFORMED DUE TO PREDICTABILITY OF AMP/PEN/VAN SUSCEPTIBILITY. Performed at [facility]  [facility]ospital Lab, [place] [address], [place], [place] [address]    LABORGA ESCHERICHIA COLI (A) 03/11/2018     Lab Results  Component Value Date   ALBUMIN 3.1 (L) 03/23/2019   ALBUMIN 3.2 (L) 03/22/2019   ALBUMIN 3.1 (L) 03/02/2019    Lab Results  Component Value Date   MG 2.5 (H) 03/11/2019   MG 2.5 (H) 03/10/2019   MG 2.4 03/09/2019   No results found for: VD25OH  No results found for: PREALBUMIN CBC EXTENDED Latest Ref Rng & Units 03/23/2019 03/22/2019 03/03/2019  WBC 4.0 - 10.5 K/uL 5.5 5.8 -  RBC 3.87 - 5.11 MIL/uL 3.48(L) 3.60(L) -  HGB 12.0 - 15.0 g/dL 9.3(L) 9.6(L) 8.8(L)  HCT 36.0 - 46.0 % 30.3(L) 31.4(L) 26.0(L)  PLT 150 - 400  K/uL 288 290 -  NEUTROABS 1.7 - 7.7 K/uL - 4.3 -  LYMPHSABS 0.7 - 4.0 K/uL - 0.7 -     There is no height or weight on file to calculate BMI.  Orders:  Orders Placed This Encounter  Procedures  . XR Shoulder Right   No orders of the defined types were placed in this encounter.    Procedures: Large Joint Inj: R subacromial bursa on 04/05/2019 1:48 PM Indications: pain Details: 22 G 1.5 in needle Medications: 5 mL lidocaine 1 %; 40 mg methylPREDNISolone acetate 40 MG/ML Consent was given by the patient.      Clinical Data: No additional findings.  ROS:  All other systems negative, except as noted in the HPI. Review of Systems  Constitutional: Negative for chills and fever.  Musculoskeletal: Positive for arthralgias and myalgias.  Neurological: Negative for weakness and numbness.    Objective: Vital Signs: There were no vitals taken for this visit.  Specialty Comments:  No specialty comments available.  PMFS History: Patient Active Problem List   Diagnosis Date Noted  . Diabetic neuropathy associated with type 2 diabetes mellitus ([place]) 04/02/2019  . Dizziness 03/22/2019  . Orthostatic hypotension 03/22/2019  . Hypoxemia   . Palliative care by specialist   . DNR (do not resuscitate) discussion   . Right upper lobe pulmonary nodule 02/13/2019  . Closed right ankle fracture, with malunion, subsequent encounter 02/11/2019  . Charcot ankle, right   . Pain from implanted hardware   . SOB (shortness of breath)   . Acute on chronic systolic (congestive) heart failure ([place])   . Benign hypertensive heart and kidney disease with diastolic CHF, NYHA class II and CKD stage III ([place])   . Diabetes mellitus type 2 in obese ([place])   . Supplemental oxygen dependent   . Trimalleolar fracture of ankle, closed, right, sequela 12/01/2018  . Hypoxia   . Acute on chronic anemia   . Closed dislocation of right talus   . Acute on chronic postoperative respiratory failure ([place])   .  Postprocedural hypotension   . Trimalleolar fracture of ankle, closed, right, initial encounter   . Syndesmotic disruption of ankle, right, initial encounter   . Ankle fracture 11/22/2018  . Pain in joint, ankle and foot 04/10/2018  . Closed left fibular fracture 04/10/2018  . CKD (chronic kidney disease), stage IV ([place]) 04/09/2018  . Near syncope 04/09/2018  . Dyspnea   . Bronchitis, acute 03/11/2018  . Fatigue 03/11/2018  . Anemia due to stage 4 chronic kidney disease ([place]) 03/11/2018  . Pulmonary fibrosis ([place]) 03/11/2018  . Pulmonary hypertension, unspecified ([place]) 03/11/2018  . Anemia 03/11/2018  . Lisfranc dislocation, left, subsequent encounter 06/08/2017  .  Chronic respiratory failure with hypoxia ([place]) 06/03/2017  . Falls 06/03/2017  . Hypokalemia 06/03/2017  . Sepsis secondary to UTI ([place]) 06/02/2017  . Dog bite 05/02/2017  . CHF exacerbation ([place]) 05/02/2017  . Acute diastolic CHF (congestive heart failure) ([place]) 05/02/2017  . Chronic diastolic CHF (congestive heart failure) ([place]) 04/02/2017  . RVF (right ventricular failure) ([place]) 04/02/2017  . Acute on chronic respiratory failure ([place]) 03/10/2017  . Chronic pain 03/10/2017  . Lactic acidosis 02/10/2017  . Acute respiratory failure with hypoxia ([place]) 02/09/2017  . Acute on chronic diastolic CHF (congestive heart failure) ([place]) 06/23/2016  . HTN (hypertension) 06/23/2016  . Type 2 diabetes mellitus with complication, with long-term current use of insulin ([place]) 03/11/2016  . Depression 03/11/2016  . Chronic obstructive pulmonary disease ([place]) 03/10/2016  . S/P lumbar spinal fusion 02/22/2016  . Coronary artery disease involving native coronary artery 07/12/2014  . Pain in the chest   . Malignant neoplasm of lower lobe of right lung ([place]) 05/25/2014  . Lung cancer ([place]) 09/01/2012   Past Medical History:  Diagnosis Date  . Anemia   . Anxiety   . Arthritis   . CAD (coronary artery disease)    a. Prior cath 2015  showed 40% prox AD, 50-50% mLAD, otherwise calcification but no obstruction in LCx/RCA.. Medical management recommended. b. 2017: low-risk NST.  [name] Chronic diastolic CHF (congestive heart failure) ([place])   . Chronic respiratory failure ([place])   . CKD (chronic kidney disease), stage III ([place])   . Complication of anesthesia    oxygen level dropped in last surgery 11/2018  . COPD (chronic obstructive pulmonary disease) ([place])   . Cor pulmonale ([name])    a. felt due to advanced COPD and noncompliance with O2.  . Depression   . Diabetes mellitus    [name]    dx  2008  . Fracture    left foot  . Hx of seasonal allergies   . Hypercholesteremia   . Hypertension   . Lung cancer ([place])   . On home oxygen therapy    "2.5L; 24/7" (03/10/2017)  . Pericardial effusion    a. small-moderate in 07/2016.  [name] Pneumonia   . Pulmonary hypertension ([place])   . UTI (urinary tract infection)     Family History  Problem Relation Age of Onset  . Diabetes Father   . Heart failure Father   . Cancer Sister   . Diabetes Brother   . Heart failure Brother     Past Surgical History:  Procedure Laterality Date  . ANKLE FUSION Right 02/11/2019   Procedure: RIGHT ANKLE FUSION;  Surgeon: Newt Minion, MD;  Location: [facility];  Service: Orthopedics;  Laterality: Right;  . APPLICATION OF WOUND VAC Right 11/23/2018   Procedure: Application Of Wound Vac;  Surgeon: Newt Minion, MD;  Location: [facility];  Service: Orthopedics;  Laterality: Right;  . [facility] in [place]     bilateral cataracts  . HARDWARE REMOVAL Right 02/11/2019   Procedure: REMOVAL DEEP HARDWARE RIGHT ANKLE;  Surgeon: Newt Minion, MD;  Location: [facility];  Service: Orthopedics;  Laterality: Right;  . LEFT HEART CATHETERIZATION WITH CORONARY ANGIOGRAM N/A 07/12/2014   Procedure: LEFT HEART CATHETERIZATION WITH CORONARY ANGIOGRAM;  Surgeon: [name], MD;  Location: [facility] CATH LAB;  Service:  Cardiovascular;  Laterality: N/A;  . MAXIMUM ACCESS (MAS)POSTERIOR LUMBAR INTERBODY FUSION (PLIF) 2 LEVEL  N/A 02/22/2016   Procedure: Lumbar three-four - Lumbar four-five  MAXIMUM ACCESS SURGERY  POSTERIOR LUMBAR INTERBODY FUSION;  Surgeon: [name], MD;  Location: [facility];  Service: Neurosurgery;  Laterality: N/A;  . ORIF ANKLE FRACTURE Right 11/23/2018   Procedure: OPEN REDUCTION INTERNAL FIXATION (ORIF) RIGHT ANKLE FRACTURE;  Surgeon: Newt Minion, MD;  Location: [facility];  Service: Orthopedics;  Laterality: Right;  . ORIF TOE FRACTURE Left 06/12/2017   Procedure: OPEN REDUCTION INTERNAL FIXATION (ORIF) BASE 1ST METATARSAL (TOE) FRACTURE;  Surgeon: Newt Minion, MD;  Location: [facility];  Service: Orthopedics;  Laterality: Left;  . RIGHT HEART CATH N/A 08/06/2017   Procedure: RIGHT HEART CATH;  Surgeon: Larey Dresser, MD;  Location: [facility] LAB;  Service: Cardiovascular;  Laterality: N/A;  . RIGHT HEART CATH N/A 03/03/2019   Procedure: RIGHT HEART CATH;  Surgeon: Jolaine Artist, MD;  Location: [facility] LAB;  Service: Cardiovascular;  Laterality: N/A;  . THORACOTOMY Right 2010   lower  . TONSILLECTOMY     Social History   Occupational History  . Not on file  Tobacco Use  . Smoking status: Former Smoker    Packs/day: 1.00    Years: 30.00    Pack years: 30.00    Quit date: 07/14/2006    Years since quitting: 12.7  . Smokeless tobacco: Never Used  Substance and Sexual Activity  . Alcohol use: No  . Drug use: No  . Sexual activity: Not on file

## 2019-04-05 NOTE — Progress Notes (Signed)
PCP: Dr. Justin Mend HF Cardiology: Dr. Aundra Dubin  73 y.o. with history of hypoxemic respiratory failure, chronic diastolic CHF, pulmonary hypertension, prior lung cancer, and CKD stage 3 returns for followup of pulmonary hypertension.  Patient wears home oxygen.  PFTs in 3/18 showed moderate-severe restriction but only minimal obstruction.  CTA chest in 1/18 showed relatively normal lung parenchyma and V/Q scan in 8/18 showed no chronic or acute PE.  Echo in 8/18 showed normal LV EF with dilated and dysfunctional RV.  She was admitted in 10/18 with acute/chronic diastolic CHF and was diuresed with IV Lasix.    We had planned a RHC in the fall of 2018 but she developed a Lisfranc fracture in her left foot requiring internal fixation and also had an admission with urosepsis.  RHC was postponed. Echo in 11/18 showed EF 55%, RV moderately dilated, PASP 51 mmHg. RHC was later done in 1/19, showing moderate PAH.   Patient was admitted in 9/19 with presyncope. She was orthostatic.  Opsumit, lisinopril, and spironolactone were stopped. She developed visual changes/blurry vision and stopped tadalafil.  Stopping tadalafil did not help.  She is seeing an ophthalmologist for the visual changes, MRI head was unremarkable.  Echo in 9/19 showed EF 55-60% with septal flattening, mildly dilated RV with moderately decreased systolic function, PA systolic pressure 81 mmHg.   In 5/20, she had a right ankle fracture with ORIF. She was also admitted in 8/20 for debridement, removal of hardware, and right ankle fusion.  This admission was complicated by CHF.  RHC (8/20) showed PA pressure 59/20 with PVR 3.2 WU.  Echo in 8/20 showed EF 55-60%, moderate LVH, moderately dilated RV with moderately decreased RV systolic function, D-shaped septum.   She was admitted again in 9/20 with hypotension/dehydration, Lasix was decreased and midodrine increased.  She was noted to have Charcot breakdown of right ankle fusion and fibula fracture.     [facility] is now at [facility].  She has a boot on her right foot and is walking with a walker with PT.  No lightheadedness now, BP seems to be better.  No orthopnea/PND.  No chest pain.  No syncope.  Weight is down 8 lbs since last office appt.  Breathing is ok when she walks with PT.  She has pain in the back of her neck which she has attributed to selexipag.  It is excruciating at times.   6 minute walk (4/19): 137 m  ECG (personally reviewed): NSR, RVH, inferior and lateral TWIs.   Labs (10/18): K 4, creatinine 1.46, hgb 14.5 Labs (1/19): K 3, creatinine 1.8 => 1.23 Labs (4/19): LDL 257, K 4, creatinine 1.67 Labs (10/19): ESR 83, K 3.6, creatinine 1.5 => 1.57, ANA negative, anti-centromere Ab negative.  Labs (9/20): K 3.5, creatinine 1.14  PMH: 1. Chronic hypoxemic respiratory failure: On home oxygen.  2. COPD: This diagnosis has been listed, but PFTs in 3/18 did not show significant obstruction.  - CT chest (6/20): Emphysema.  3. Chronic diastolic CHF: With cor pulmonale.  - Echo (8/18): EF 65-70%, severe RV dilation with decreased RV systolic function, PASP 67 mmHg.  - Echo (11/18): EF 55%, PASP 51 mmHg, RV moderately dilated.  - Echo (9/19): EF 55-60%, septal flattening, PASP 81 mmHg, RV mildly dilated with moderately decreased systolic function.  - Echo (8/20): EF 55-60%, moderate LVH, septal flattening, moderately dysfunctional RV with moderate RV dilation.   4. Type II diabetes 5. HTN 6. Hyperlipidemia 7. CKD: Stage 3.  8.  CAD: LHC (2015) with 40% pLAD, 50% mLAD.  9. Lung cancer: Treated in 2010 with chemotherapy and resection.  No recurrence.  10. 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   - V/Q scan negative for chronic PE 8/18.  - CTA chest (1/18): No PE, no emphysema, no ILD. - PFTs (3/18): moderate-severe restriction with minimal obstruction, severely decreased DLCO. FVC 63%, FEV1 72%, ratio 114%.  - Autoimmune workup negative except for weakly positive ANCA  (1:40).  - Sleep study (2/19): No OSA, there is nocturnal hypoxemia.  - RHC (1/19): mean RA 7, PA 55/16 mean 34, mean PCWP 15, CI 2.34.  - RHC (8/20): mean RA 3, PA 59/20, mean PCWP 10, PVR 3.2 WU 11. Right Charcot foot  SH: son and daughter in [place], quit smoking in 2008.   Family History  Problem Relation Age of Onset  . Diabetes Father   . Heart failure Father   . Cancer Sister   . Diabetes Brother   . Heart failure Brother    ROS: All systems reviewed and negative except as per HPI.  Current Outpatient Medications  Medication Sig Dispense Refill  . acetaminophen (TYLENOL) 325 MG tablet Take 650 mg by mouth every 6 (six) hours as needed for moderate pain or fever.     [name] albuterol (PROVENTIL) (2.5 MG/3ML) 0.083% nebulizer solution Take 3 mLs (2.5 mg total) by nebulization every 4 (four) hours as needed for wheezing or shortness of breath. 75 mL 12  . atorvastatin (LIPITOR) 40 MG tablet Take 1 tablet (40 mg total) by mouth daily. 90 tablet 3  . azelastine (ASTELIN) 0.1 % nasal spray Place 1 spray into both nostrils 2 (two) times daily. Use in each nostril as directed 30 mL 6  . budesonide (PULMICORT) 0.5 MG/2ML nebulizer solution Take 2 mLs (0.5 mg total) by nebulization 2 (two) times daily. 120 mL 3  . docusate sodium (COLACE) 100 MG capsule Take 1 capsule (100 mg total) by mouth 2 (two) times daily as needed for mild constipation.    . formoterol (PERFOROMIST) 20 MCG/2ML nebulizer solution Take 2 mLs (20 mcg total) by nebulization 2 (two) times daily. 120 mL 2  . furosemide (LASIX) 40 MG tablet Take 1 tablet (40 mg total) by mouth 2 (two) times daily. 60 tablet 2  . gabapentin (NEURONTIN) 300 MG capsule Take 300 mg by mouth 2 (two) times daily.    [name] guaiFENesin (MUCINEX) 600 MG 12 hr tablet Take 1 tablet (600 mg total) by mouth 2 (two) times daily. 60 tablet 1  . insulin detemir (LEVEMIR) 100 UNIT/ML injection Inject 0.2 mLs (20 Units total) into the skin at bedtime.    .  insulin lispro (HUMALOG) 100 UNIT/ML injection Inject 2-15 Units into the skin See admin instructions. Inject 2-15 units into the skin three times a day before meals and at bedtime, per sliding scale. 121-150 = 2 units 151-200 = 3 units 201-250 = 5 units 251-300 = 8 units 301-350 = 11 units 351-400 = 15 units Greater than 400 call MD    . ipratropium (ATROVENT) 0.02 % nebulizer solution Take 2.5 mLs (0.5 mg total) by nebulization 3 (three) times daily. 75 mL 12  . iron polysaccharides (NIFEREX) 150 MG capsule Take 1 capsule (150 mg total) by mouth 2 (two) times daily. 60 capsule 1  . levalbuterol (XOPENEX) 0.63 MG/3ML nebulizer solution Take 3 mLs (0.63 mg total) by nebulization 3 (three) times daily. 3 mL 12  . macitentan (OPSUMIT) 10 MG tablet  Take 1 tablet (10 mg total) by mouth daily. 30 tablet 11  . midodrine (PROAMATINE) 2.5 MG tablet Take 3 tablets (7.5 mg total) by mouth 3 (three) times daily with meals. 260 tablet 0  . mometasone-formoterol (DULERA) 200-5 MCG/ACT AERO Inhale 2 puffs into the lungs 2 (two) times daily. 13 g 1  . OXYGEN Inhale 4-5 L/min into the lungs continuous.     . potassium chloride SA (K-DUR) 20 MEQ tablet Take 1 tablet (20 mEq total) by mouth daily. 30 tablet 0  . sertraline (ZOLOFT) 50 MG tablet Take 75 mg by mouth daily.     . traMADol (ULTRAM) 50 MG tablet Take 1 tablet (50 mg total) by mouth every 6 (six) hours as needed for moderate pain. 4 tablet 0  . TRULICITY 1.5 WJ/1.9JY SOPN Inject 1.5 mg into the vein every Tuesday.     . umeclidinium bromide (INCRUSE ELLIPTA) 62.5 MCG/INH AEPB Inhale 1 puff into the lungs daily. 30 each 1  . Selexipag 400 MCG TABS Take 400 mcg by mouth 2 (two) times daily. 60 tablet 3   No current facility-administered medications for this encounter.    Facility-Administered Medications Ordered in Other Encounters  Medication Dose Route Frequency Provider Last Rate Last Dose  . diatrizoate meglumine-sodium (GASTROGRAFIN) 66-10 %  solution 30 mL  30 mL Oral PRN MacDiarmid, Nicki Reaper, MD   30 mL at 11/30/15 0820   BP 124/76   Pulse 75   Wt 75.3 kg (166 lb)   SpO2 95%   BMI 31.37 kg/m  General: NAD, frail Neck: No JVD, no thyromegaly or thyroid nodule.  Lungs: Occasional rhonchi CV: Nondisplaced PMI.  Heart regular S1/S2, no S3/S4, no murmur.  Trace ankle edema.  No carotid bruit.  Normal pedal pulses.  Abdomen: Soft, nontender, no hepatosplenomegaly, no distention.  Skin: Intact without lesions or rashes.  Neurologic: Alert and oriented x 3.  Psych: Normal affect. Extremities: No clubbing or cyanosis.  HEENT: Normal.   Assessment/Plan: 1. Pulmonary hypertension: Possible group 1 + group 3 PH.  Serologies negative.  Restrictive PFTs but lung parenchyma looked relatively normal on 1/18 CTA chest.  Findings by PFTs not consistent with emphysema. Repeat CT chest in 6/20 was read as showing emphysema. V/Q scan negative for chronic or acute PEs. RHC (1/19) showed moderate pulmonary arterial hypertension. Echo in 8/18 showed dilated and dysfunctional RV, similar in 11/18 and 9/19.  Echo in 7/82 showed PA systolic pressure still very high at 81 mmHg.  Echo in 8/20 showed dysfunctional RV, RHC in 8/20 showed PA pressure 59/20 with PVR lower at 3.2 WU.  Sleep study showed nocturnal hypoxemia but not OSA.  She wears home oxygen.  She is currently on Opsumit and selexipag, selexipag seems to be causing her neck pain (improves as we have decreased the selexipag dose.   - Continue to wear home oxygen.  - Will not do 6 minute walk today as right foot remains in boot.  - Continue Opsumit.  - Given visual changes, I will not have her restart tadalafil.   - I will have her decrease selexipag to 400 mcg bid due to neck pain.  2. Chronic hypoxemic respiratory failure: Based on CT chest, it looks like emphysema plays a large role here.  - She should continue to follow with pulmonary.  - She remains on home oxygen.   3. Chronic diastolic  CHF: With prominent RV failure. RVH on ECG.  She is not volume overloaded on exam.   -  Continue Lasix to 40 mg bid. - Given orthostasis likely related to RV failure, she will continue midodrine 7.5 mg tid.  4. CKD: Stage 3.  BMET today.  5. CAD: Nonobstructive in 2015.  No exertional chest pain.   - Continue atorvastatin 40 mg daily.  6. Orthostatic hypotension: RV failure may play a role.  Improved on midodrine.     [name] 04/05/2019

## 2019-04-12 ENCOUNTER — Other Ambulatory Visit: Payer: Self-pay

## 2019-04-12 ENCOUNTER — Ambulatory Visit (INDEPENDENT_AMBULATORY_CARE_PROVIDER_SITE_OTHER): Payer: Medicare Other | Admitting: Pulmonary Disease

## 2019-04-12 ENCOUNTER — Encounter: Payer: Self-pay | Admitting: Pulmonary Disease

## 2019-04-12 VITALS — BP 120/70 | HR 88 | Temp 97.7°F

## 2019-04-12 DIAGNOSIS — Z23 Encounter for immunization: Secondary | ICD-10-CM

## 2019-04-12 DIAGNOSIS — J449 Chronic obstructive pulmonary disease, unspecified: Secondary | ICD-10-CM | POA: Diagnosis not present

## 2019-04-12 MED ORDER — TRELEGY ELLIPTA 100-62.5-25 MCG/INH IN AEPB: 1.0000 | INHALATION_SPRAY | Freq: Every day | RESPIRATORY_TRACT | 3 refills | Status: DC

## 2019-04-12 MED ORDER — TRELEGY ELLIPTA 100-62.5-25 MCG/INH IN AEPB: 1.0000 | INHALATION_SPRAY | Freq: Every day | RESPIRATORY_TRACT | 0 refills | Status: DC

## 2019-04-12 NOTE — Progress Notes (Signed)
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 Her cancer has been treated with RLL superior subsectomy resection and adjuvant chemotherapy in 2010 with no evidence of recurrence.   She was hospitalized in April 2017 for surgery and had two CHF exacerbations since then. She is recently started on oxygen for persistent hypoxia and dyspnea.  Noted to have pulmonary hypertension.  CTA negative for pulmonary embolism. She has a history of COPD and PFTs from 2012 with minimal obstruction.  Interim History: Had a sleep study in 2019 with no evidence of sleep apnea Followed by cardiology with [facility] in early 2019 showing evidence of pulmonary hypertension.  Maintained on selexipag and macitentan.  Had a prolonged hospitalization after repair of right ankle fracture in August 2020.  Had significant oxygen requirements on high flow nasal cannula.  PCCM consulted.  Noted to be in heart failure with improvement after diuresis.  Right heart cath after diuresis showed improvement in PA pressures. She has followed up with Dr. Aundra Shepard and dose of selexipag decreased due to side effects of jaw pain.  Outpatient Encounter Medications as of 04/12/2019  Medication Sig  . acetaminophen (TYLENOL) 325 MG tablet Take 650 mg by mouth every 6 (six) hours as needed for moderate pain or fever.   [name] albuterol (PROVENTIL) (2.5 MG/3ML) 0.083% nebulizer solution Take 3 mLs (2.5 mg total) by nebulization every 4 (four) hours as needed for wheezing or shortness of breath.  [name] atorvastatin (LIPITOR) 40 MG tablet Take 1 tablet (40 mg total) by mouth daily.  [name] azelastine (ASTELIN) 0.1 % nasal  spray Place 1 spray into both nostrils 2 (two) times daily. Use in each nostril as directed  . budesonide (PULMICORT) 0.5 MG/2ML nebulizer solution Take 2 mLs (0.5 mg total) by nebulization 2 (two) times daily.  [name] docusate sodium (COLACE) 100 MG capsule Take 1 capsule (100 mg total) by mouth 2 (two) times daily as needed for mild constipation.  . formoterol (PERFOROMIST) 20 MCG/2ML nebulizer solution Take 2 mLs (20 mcg total) by nebulization 2 (two) times daily.  . furosemide (LASIX) 40 MG tablet Take 1 tablet (40 mg total) by mouth 2 (two) times daily.  [name] gabapentin (NEURONTIN) 300 MG capsule Take 300 mg by mouth 2 (two) times daily.  [name] guaiFENesin (MUCINEX) 600 MG 12 hr tablet Take 600 mg by mouth 2 (two) times daily.  . insulin detemir (LEVEMIR) 100 UNIT/ML injection Inject 0.2 mLs (20 Units total) into the skin at bedtime.  . insulin lispro (HUMALOG) 100 UNIT/ML injection Inject 2-15 Units into the skin See admin instructions. Inject 2-15 units into the skin three times a day before meals and at bedtime, per sliding scale. 121-150 = 2 units 151-200 = 3 units 201-250 = 5 units 251-300 = 8 units 301-350 = 11 units 351-400 = 15 units Greater than 400 call MD  . ipratropium (ATROVENT) 0.02 % nebulizer solution Take 2.5 mLs (0.5 mg total) by nebulization 3 (three) times daily.  . iron polysaccharides (NIFEREX) 150 MG capsule Take 1 capsule (150 mg total) by mouth 2 (two) times daily.  [name] levalbuterol (XOPENEX) 0.63 MG/3ML nebulizer solution  Take 3 mLs (0.63 mg total) by nebulization 3 (three) times daily.  . macitentan (OPSUMIT) 10 MG tablet Take 1 tablet (10 mg total) by mouth daily.  . midodrine (PROAMATINE) 2.5 MG tablet Take 3 tablets (7.5 mg total) by mouth 3 (three) times daily with meals.  . mometasone-formoterol (DULERA) 200-5 MCG/ACT AERO Inhale 2 puffs into the lungs 2 (two) times daily.  . OXYGEN Inhale 4-5 L/min into the lungs continuous.   . potassium chloride SA (K-DUR) 20 MEQ  tablet Take 1 tablet (20 mEq total) by mouth daily.  . Selexipag 400 MCG TABS Take 400 mcg by mouth 2 (two) times daily.  . sertraline (ZOLOFT) 50 MG tablet Take 75 mg by mouth daily.   . traMADol (ULTRAM) 50 MG tablet Take 1 tablet (50 mg total) by mouth every 6 (six) hours as needed for moderate pain.  . TRULICITY 1.5 JY/7.8GN SOPN Inject 1.5 mg into the vein every Tuesday.   . umeclidinium bromide (INCRUSE ELLIPTA) 62.5 MCG/INH AEPB Inhale 1 puff into the lungs daily.   Facility-Administered Encounter Medications as of 04/12/2019  Medication  . diatrizoate meglumine-sodium (GASTROGRAFIN) 66-10 % solution 30 mL   Physical Exam: Blood pressure 120/70, pulse 88, temperature 97.7 F (36.5 C), temperature source Temporal, SpO2 99 %. Gen:      No acute distress HEENT:  EOMI, sclera anicteric Neck:     No masses; no thyromegaly Lungs:    Clear to auscultation bilaterally; normal respiratory effort CV:         Regular rate and rhythm; no murmurs Abd:      + bowel sounds; soft, non-tender; no palpable masses, no distension Ext:    No edema; adequate peripheral perfusion Skin:      Warm and dry; no rash Neuro: alert and oriented x 3 Psych: normal mood and affect  Data Reviewed: Imaging: CT 01/04/2019- emphysema, scarlike density in the left upper lobe.  Mild progression of subsegmental atelectasis in the lingula.  Postsurgical changes I have reviewed the images personally.  PFTs 01/01/11 FVC 2.02 [77) FEV1 1.63 [86%) F/F 81 TLC 84% DLCO 49% Minimal obstructive airway disease, Shepard airway disease. No restriction Moderate to severe reduction in diffusion capacity  PFTs 09/11/16 FVC 1.67 (62%) FEV1 1.45 (72%) F/F 87 TLC 64% DLCO 31% Restriction with severe diffusion defect. No obstructive lung disease.  Labs: CBC 03/22/2019-WBC 5.8, eos 4%, absolute eosinophil 232  Serologies 09/11/16 ANA, CCP, RA, Scl 70 negative c ANCA 1:20 Sed rate 37 HIV negative LFTs- normal except for alk  phos 165  ANA, centromere antibody 05/06/2018-negative  Cardiac: 08/06/2017 RHC PA = 55/16 (34), PCW=15 Fick cardiac output/index = 4.05/2.34 PVR = 8.1 WU  03/03/19 RHC PA = 59/20 (34), PCW = 10 Fick cardiac output/index = 7.4/4.2 PVR = 3.2 WU  Echo 02/14/19- EF 55-60%, moderate LVH, moderately dilated RV with moderately decreased RV systolic function, D-shaped septum.   Sleep: In lab PSG 08/27/2017- no significant OSA.  AHI 3.8.  No significant central sleep apnea.  Moderate oxygen desaturation to 79%.  Assessment:  COPD Her PFTs from 2012 and 2018 shows a very minimal obstructive airway, Shepard airway disease.  Emphysema noted on CT She is on Dulera and Perforomist, Pulmicort.  We will stop these and consolidate inhaler therapy to Trelegy Continue supplemental oxygen.  Pulmonary HTN with cor pulmonale Follows with cardiology Continue opsumit and selexipag  There is no evidence of acute PE or other interstitial abnormalities on CT of the chest. A basic  work up for pulm HTN is normal except for borderline elevation of c-ANCA.   Health maintenance Flu vaccine today.  Plan/Recommendations: Designer, fashion/clothing.  Continue supplemental oxygen - Flu vaccine today.  Return in 3 month.  Marshell Garfinkel MD [facility] 04/12/2019, 11:32 AM  CC: Amy Small, MD

## 2019-04-12 NOTE — Patient Instructions (Signed)
Will get a flu vaccine today Stop the Dulera, Perforomist and Pulmicort We will start you on an inhaler called Trelegy  We will refer to pharmacy for review of medication and optimization Follow-up in 3 months.

## 2019-04-13 ENCOUNTER — Non-Acute Institutional Stay (SKILLED_NURSING_FACILITY): Payer: Medicare Other | Admitting: Internal Medicine

## 2019-04-13 ENCOUNTER — Encounter: Payer: Self-pay | Admitting: Internal Medicine

## 2019-04-13 DIAGNOSIS — I5033 Acute on chronic diastolic (congestive) heart failure: Secondary | ICD-10-CM | POA: Diagnosis not present

## 2019-04-13 DIAGNOSIS — Z794 Long term (current) use of insulin: Secondary | ICD-10-CM

## 2019-04-13 DIAGNOSIS — E1142 Type 2 diabetes mellitus with diabetic polyneuropathy: Secondary | ICD-10-CM

## 2019-04-13 DIAGNOSIS — J449 Chronic obstructive pulmonary disease, unspecified: Secondary | ICD-10-CM

## 2019-04-13 DIAGNOSIS — I951 Orthostatic hypotension: Secondary | ICD-10-CM

## 2019-04-13 DIAGNOSIS — E118 Type 2 diabetes mellitus with unspecified complications: Secondary | ICD-10-CM

## 2019-04-13 DIAGNOSIS — S82891P Other fracture of right lower leg, subsequent encounter for closed fracture with malunion: Secondary | ICD-10-CM

## 2019-04-13 NOTE — Progress Notes (Signed)
Location:  [facility] [place] Room Number: 112-P Place of Service:  SNF (31)  PCP: Hennie Duos, MD Patient Care Team: Hennie Duos, MD as PCP - General (Internal Medicine) Larey Dresser, MD as PCP - Advanced Heart Failure (Cardiology) Newt Minion, MD as Consulting Physician (Orthopedic Surgery)  Extended Emergency Contact Information Primary Emergency Contact: Berton Lan [place] Mobile Phone: [phone] Relation: Daughter  Allergies  Allergen Reactions   Amoxicillin Anaphylaxis, Hives, Rash and Other (See Comments)    Has patient had a PCN reaction causing immediate rash, facial/tongue/throat swelling, SOB or lightheadedness with hypotension: YES Positive reaction causing SEVERE RASH INVOLVING MUCUS MEMBRANES/SKIN NECROSIS: YES Reaction that required HOSPITALIZATION: YES Reaction occurring within the last 10 years: NO   Doxycycline Diarrhea    Chief Complaint  Patient presents with   Discharge Note    Patient is seen for discharge from [facility] SNF on 04/14/19    HPI:  73 y.o. female seen today for discharge from facility tomorrow.  She will be going home with home health PT OT and nursing is she is on chronic oxygen at 5 L and will receive this as well.  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  She subsequently re-presented to hospital with dizziness work-up was fairly unremarkable and her dizziness resolved.  She was started on Midodrine with suspicions of hypotension  Her other medical issues were stable they include hypertension hyperlipidemia and COPD diabetes chronic kidney disease stage III diastolic CHF mild aortic stenosis and history of lung cancer status post right lower lobe superior segment ectomy  She did see pulmonology yesterday who made some medication  changes Dulera Pulmicort and Perforomist were discontinued and therapy was consolidated to Trelegy she also continues on nebulizers and Mucinex-and is on chronic oxygen.  She is not really reporting any increased cough or shortness of breath and appears to be relatively stable in this regard.  Regards to her right ankle fracture with ORIF she did receive a debridement on August 20 with removal of hardware and a right ankle fusion as noted above.  .  She also has a history of CHF she is on Lasix 40 mg twice daily and is followed by cardiology this appears to be stable.  She also is on Midodrine with a history of hypotension.  It is thought this possibly  contributed to her dizziness  Blood pressures appear to have been stable 104/67 most recently.  He is not really complaining of dizziness today this appears to have resolved.  Regards to diabetes she continues on Levemir 20 units a day and Trulicity once a week blood sugar this morning was 163 it was 201 yesterday   since she is about to go home we will not be aggressive changing medication.  Currently she is sitting in her chair comfortably she does have an independent apartment and will have nursing support as well as PT and OT    Past Medical History:  Diagnosis Date   Anemia    Anxiety    Arthritis    CAD (coronary artery disease)    a. Prior cath 2015 showed 40% prox AD, 50-50% mLAD, otherwise calcification but no obstruction in LCx/RCA.. Medical management recommended. b. 2017: low-risk NST.   Chronic diastolic CHF (congestive heart failure) ([name])    Chronic  respiratory failure ([name])    CKD (chronic kidney disease), stage III ([name])    Complication of anesthesia    oxygen level dropped in last surgery 11/2018   COPD (chronic obstructive pulmonary disease) ([place])    Cor pulmonale ([name])    a. felt due to advanced COPD and noncompliance with O2.   Depression    Diabetes mellitus    januvia    dx  2008   Fracture     left foot   Hx of seasonal allergies    Hypercholesteremia    Hypertension    Lung cancer ([place])    On home oxygen therapy    "2.5L; 24/7" (03/10/2017)   Pericardial effusion    a. small-moderate in 07/2016.   Pneumonia    Pulmonary hypertension ([place])    UTI (urinary tract infection)     Past Surgical History:  Procedure Laterality Date   ANKLE FUSION Right 02/11/2019   Procedure: RIGHT ANKLE FUSION;  Surgeon: Newt Minion, MD;  Location: [place];  Service: Orthopedics;  Laterality: Right;   APPLICATION OF WOUND VAC Right 11/23/2018   Procedure: Application Of Wound Vac;  Surgeon: Newt Minion, MD;  Location: [place];  Service: Orthopedics;  Laterality: Right;   CARDIAC CATHETERIZATION     1995 or 64  [facility] [place]     bilateral cataracts   HARDWARE REMOVAL Right 02/11/2019   Procedure: REMOVAL DEEP HARDWARE RIGHT ANKLE;  Surgeon: Newt Minion, MD;  Location: [place];  Service: Orthopedics;  Laterality: Right;   LEFT HEART CATHETERIZATION WITH CORONARY ANGIOGRAM N/A 07/12/2014   Procedure: LEFT HEART CATHETERIZATION WITH CORONARY ANGIOGRAM;  Surgeon: [name], MD;  Location: [facility] CATH LAB;  Service: Cardiovascular;  Laterality: N/A;   MAXIMUM ACCESS (MAS)POSTERIOR LUMBAR INTERBODY FUSION (PLIF) 2 LEVEL N/A 02/22/2016   Procedure: Lumbar three-four - Lumbar four-five  MAXIMUM ACCESS SURGERY  POSTERIOR LUMBAR INTERBODY FUSION;  Surgeon: [name], MD;  Location: [place] NEURO ORS;  Service: Neurosurgery;  Laterality: N/A;   ORIF ANKLE FRACTURE Right 11/23/2018   Procedure: OPEN REDUCTION INTERNAL FIXATION (ORIF) RIGHT ANKLE FRACTURE;  Surgeon: Newt Minion, MD;  Location: [place];  Service: Orthopedics;  Laterality: Right;   ORIF TOE FRACTURE Left 06/12/2017   Procedure: OPEN REDUCTION INTERNAL FIXATION (ORIF) BASE 1ST METATARSAL (TOE) FRACTURE;  Surgeon: Newt Minion, MD;  Location: [place];  Service: Orthopedics;  Laterality: Left;    RIGHT HEART CATH N/A 08/06/2017   Procedure: RIGHT HEART CATH;  Surgeon: Larey Dresser, MD;  Location: [place] CV LAB;  Service: Cardiovascular;  Laterality: N/A;   RIGHT HEART CATH N/A 03/03/2019   Procedure: RIGHT HEART CATH;  Surgeon: Jolaine Artist, MD;  Location: [place] CV LAB;  Service: Cardiovascular;  Laterality: N/A;   THORACOTOMY Right 2010   lower   TONSILLECTOMY       reports that she quit smoking about 12 years ago. She has a 30.00 pack-year smoking history. She has never used smokeless tobacco. She reports that she does not drink alcohol or use drugs. Social History   Socioeconomic History   Marital status: Divorced    Spouse name: Not on file   Number of children: Not on file   Years of education: Not on file   Highest education level: Not on file  Occupational History   Not on file  Social Needs   Financial resource strain: Not on file  Food insecurity    Worry: Not on file    Inability: Not on file   Transportation needs    Medical: Not on file    Non-medical: Not on file  Tobacco Use   Smoking status: Former Smoker    Packs/day: 1.00    Years: 30.00    Pack years: 30.00    Quit date: 07/14/2006    Years since quitting: 12.7   Smokeless tobacco: Never Used  Substance and Sexual Activity   Alcohol use: No   Drug use: No   Sexual activity: Not on file  Lifestyle   Physical activity    Days per week: Not on file    Minutes per session: Not on file   Stress: Not on file  Relationships   Social connections    Talks on phone: Not on file    Gets together: Not on file    Attends religious service: Not on file    Active member of club or organization: Not on file    Attends meetings of clubs or organizations: Not on file    Relationship status: Not on file   Intimate partner violence    Fear of current or ex partner: Not on file    Emotionally abused: Not on file    Physically abused: Not on file    Forced sexual  activity: Not on file  Other Topics Concern   Not on file  Social History Narrative   Former smoker, quit in 2008.  No alcohol use.    Pertinent  Health Maintenance Due  Topic Date Due   FOOT EXAM  11/22/1955   OPHTHALMOLOGY EXAM  11[birth year]22/1955   URINE MICROALBUMIN  11/22/1956   MAMMOGRAM  11/22/1996   COLONOSCOPY  11/22/1996   DEXA SCAN  11/23/2011   PNA vac Low Risk Adult (1 of 2 - PCV13) 11/23/2011   HEMOGLOBIN A1C  08/28/2019   INFLUENZA VACCINE  Completed    Medications: Allergies as of 04/13/2019      Reactions   Amoxicillin Anaphylaxis, Hives, Rash, Other (See Comments)   Has patient had a PCN reaction causing immediate rash, facial/tongue/throat swelling, SOB or lightheadedness with hypotension: YES Positive reaction causing SEVERE RASH INVOLVING MUCUS MEMBRANES/SKIN NECROSIS: YES Reaction that required HOSPITALIZATION: YES Reaction occurring within the last 10 years: NO   Doxycycline Diarrhea      Medication List       Accurate as of April 13, 2019 11:59 PM. If you have any questions, ask your nurse or doctor.        STOP taking these medications   budesonide 0.5 MG/2ML nebulizer solution Commonly known as: PULMICORT Stopped by: Granville Lewis, PA-C   mometasone-formoterol 200-5 MCG/ACT Aero Commonly known as: DULERA Stopped by: Granville Lewis, PA-C   Perforomist 20 MCG/2ML nebulizer solution Generic drug: formoterol Stopped by: Granville Lewis, PA-C   traMADol 50 MG tablet Commonly known as: ULTRAM Stopped by: Granville Lewis, PA-C     TAKE these medications   acetaminophen 325 MG tablet Commonly known as: TYLENOL Take 650 mg by mouth every 6 (six) hours as needed for moderate pain or fever.   albuterol (2.5 MG/3ML) 0.083% nebulizer solution Commonly known as: PROVENTIL Take 3 mLs (2.5 mg total) by nebulization every 4 (four) hours as needed for wheezing or shortness of breath.   atorvastatin 40 MG tablet Commonly known as: LIPITOR Take 1  tablet (40 mg total) by mouth daily.   azelastine 0.1 % nasal spray Commonly known as: ASTELIN  Place 1 spray into both nostrils 2 (two) times daily. Use in each nostril as directed   docusate sodium 100 MG capsule Commonly known as: COLACE Take 1 capsule (100 mg total) by mouth 2 (two) times daily as needed for mild constipation.   furosemide 40 MG tablet Commonly known as: LASIX Take 1 tablet (40 mg total) by mouth 2 (two) times daily.   gabapentin 300 MG capsule Commonly known as: NEURONTIN Take 300 mg by mouth 2 (two) times daily.   Incruse Ellipta 62.5 MCG/INH Aepb Generic drug: umeclidinium bromide Inhale 1 puff into the lungs daily.   insulin detemir 100 UNIT/ML injection Commonly known as: LEVEMIR Inject 0.2 mLs (20 Units total) into the skin at bedtime.   insulin lispro 100 UNIT/ML injection Commonly known as: HUMALOG Inject 2-15 Units into the skin See admin instructions. Inject 2-15 units into the skin three times a day before meals and at bedtime, per sliding scale. 121-150 = 2 units 151-200 = 3 units 201-250 = 5 units 251-300 = 8 units 301-350 = 11 units 351-400 = 15 units Greater than 400 call MD   ipratropium 0.02 % nebulizer solution Commonly known as: ATROVENT Take 2.5 mLs (0.5 mg total) by nebulization 3 (three) times daily.   iron polysaccharides 150 MG capsule Commonly known as: NIFEREX Take 1 capsule (150 mg total) by mouth 2 (two) times daily.   levalbuterol 0.63 MG/3ML nebulizer solution Commonly known as: XOPENEX Take 3 mLs (0.63 mg total) by nebulization 3 (three) times daily.   macitentan 10 MG tablet Commonly known as: Opsumit Take 1 tablet (10 mg total) by mouth daily.   midodrine 2.5 MG tablet Commonly known as: PROAMATINE Take 3 tablets (7.5 mg total) by mouth 3 (three) times daily with meals.   Mucinex 600 MG 12 hr tablet Generic drug: guaiFENesin Take 600 mg by mouth 2 (two) times daily.   OXYGEN Inhale 4-5 L/min into the  lungs continuous.   potassium chloride SA 20 MEQ tablet Commonly known as: KLOR-CON Take 1 tablet (20 mEq total) by mouth daily.   Selexipag 400 MCG Tabs Take 400 mcg by mouth 2 (two) times daily.   sertraline 50 MG tablet Commonly known as: ZOLOFT Take 75 mg by mouth daily. 1.5 tablets to = 75 mg   Trelegy Ellipta 100-62.5-25 MCG/INH Aepb Generic drug: Fluticasone-Umeclidin-Vilant Inhale 1 puff into the lungs daily.   Trulicity 1.5 WC/5.8NI Sopn Generic drug: Dulaglutide Inject 1.5 mg into the vein every Tuesday.       Review of systems.  In general she says she is feeling well denies any fever chills.  Skin is not complain of rashes or itching.  Head ears eyes nose mouth and throat is not complain of visual changes or sore throat respiratory does not complain of shortness of breath or cough at this point she is on chronic oxygen with a history of significant COPD.  Cardiac does not complain of chest pain or palpitations she has trace lower extremity edema.  GI is not complaining of abdominal pain nausea vomiting diarrhea constipation.  GU is not complaining of dysuria.  Musculoskeletal does not really complain of joint pain at this time.  Neurologic does not complain of dizziness headache numbness or syncope.  Psych does not complain of being depressed or anxious she is looking forward to going home continues to be in bright spirits.     Vitals:   04/13/19 1114  BP: 104/67  Pulse: 84  Resp: 17  Temp: (!)  97.1 F (36.2 C)  TempSrc: Oral  SpO2: 94%  Weight: 166 lb (75.3 kg)  Height: _0  (1.549 m)   Body mass index is 31.37 kg/m.  Physical Exam  GENERAL APPEARANCE: Alert, conversant. No acute distress.  Sitting comfortably in her chair HEENT: Unremarkable.  Oropharynx is clear mucous membranes moist RESPIRATORY: Breathing is even, unlabored.  She has very minimal wheezing at the bases-there is no labored breathing air entry is somewhat  shallow CARDIOVASCULAR: Heart RRR no murmurs, rubs or gallops.  Minimal lower extremity edema on the left- right foot and leg are in a boot GASTROINTESTINAL: Abdomen is soft, non-tender, not distended w/ normal bowel sounds.  Musculoskeletal does move all extremities x4 some limitations of the right lower extremity's he does have a boot on the right lower leg and foot.   NEUROLOGIC: Cranial nerves 2-12 grossly intact. Moves all extremities Psych she is alert and oriented pleasant and appropriate   Labs reviewed:  April 05, 2019.  WBC 5.5 hemoglobin 9.9 platelets 152,000.  Sodium 142 potassium 4.1 BUN 40 creatinine 1.2.   Basic Metabolic Panel: Recent Labs    02/28/19 0234  03/01/19 0841 03/02/19 0213  03/09/19 0205 03/10/19 0200 03/11/19 0234  03/28/19 0653 03/29/19 0705 04/04/19 1035  NA 137   < >  --  138   < > 139 139  --    < > 139 139 139  K 4.0   < >  --  3.9   < > 4.7 4.5  --    < > 3.4* 3.5 4.3  CL 96*   < >  --  99   < > 96* 99  --    < > 100 97* 99  CO2 31   < >  --  29   < > 33* 30  --    < > _1 GLUCOSE 281*   < >  --  226*   < > 187* 65*  --    < > 172* 255* 167*  BUN 59*   < >  --  58*   < > 56* 51*  --    < > 43* 45* 34*  CREATININE 1.30*   < >  --  1.24*   < > 1.23* 1.37*  --    < > 1.18* 1.14* 1.12*  CALCIUM 8.8*   < >  --  8.8*   < > 9.2 8.9  --    < > 8.9 9.4 9.3  MG 2.3  --  2.3 2.3   < > 2.4 2.5* 2.5*  --   --   --   --   PHOS 3.5  --  4.3 4.0  --   --   --   --   --   --   --   --    < > = values in this interval not displayed.   No results found for: [facility] Liver Function Tests: Recent Labs    03/02/19 0213 03/22/19 1654 03/23/19 0824  AST _2 ALT 36 14 15  ALKPHOS 106 104 105  BILITOT 0.4 0.8 0.5  PROT 5.7* 6.1* 6.1*  ALBUMIN 3.1* 3.2* 3.1*   Recent Labs    08/18/18 1650  LIPASE 24   No results for input(s): AMMONIA in the last 8760 hours. CBC: Recent Labs    03/01/19 0841 03/02/19 0213  03/03/19 1407  03/22/19 1654 03/23/19 0824  WBC 12.9* 11.3*  --   --  5.8 5.5  NEUTROABS 11.0* 9.4*  --   --  4.3  --   HGB 9.6* 9.0*   < > 8.8* 9.6* 9.3*  HCT 32.0* 29.1*   < > 26.0* 31.4* 30.3*  MCV 82.7 82.2  --   --  87.2 87.1  PLT 249 218  --   --  290 288   < > = values in this interval not displayed.   Lipid No results for input(s): CHOL, HDL, LDLCALC, TRIG in the last 8760 hours. Cardiac Enzymes: No results for input(s): CKTOTAL, CKMB, CKMBINDEX, TROPONINI in the last 8760 hours. BNP: Recent Labs    02/13/19 1805 02/24/19 0325  BNP 1,211.6* 1,344.9*   CBG: Recent Labs    03/29/19 0619 03/29/19 1115 03/29/19 1618  GLUCAP 228* 253* 215*    Procedures and Imaging Studies During Stay: Dg Chest 2 View  Result Date: 03/25/2019 CLINICAL DATA:  Shortness of breath, history of CHF EXAM: CHEST - 2 VIEW COMPARISON:  03/22/2019 FINDINGS: Cardiomegaly. Pulmonary vascular prominence without overt edema. There is bandlike scarring or atelectasis of the right mid lung with associated surgical clips. Disc degenerative disease of the thoracic spine. IMPRESSION: No significant interval change in chest radiographs. Cardiomegaly and pulmonary vascular prominence without overt edema. Bandlike scarring or atelectasis of the right mid lung. Electronically Signed   By: Eddie Candle M.D.   On: 03/25/2019 13:27   Dg Chest 2 View  Result Date: 03/22/2019 CLINICAL DATA:  Dizziness EXAM: CHEST - 2 VIEW COMPARISON:  03/02/2019 FINDINGS: Stable cardiomediastinal contours. Aorta is calcified. No focal airspace consolidation, pleural effusion, or pneumothorax. IMPRESSION: No active cardiopulmonary disease. Electronically Signed   By: Davina Poke M.D.   On: 03/22/2019 16:14   Dg Ankle Complete Right  Result Date: 03/22/2019 CLINICAL DATA:  Recent right ankle ORIF. EXAM: RIGHT ANKLE - COMPLETE 3+ VIEW COMPARISON:  Right ankle x-rays dated February 07, 2019. FINDINGS: New subacute transverse fracture of the lateral  malleolus with slight lateral displacement. Progressive callus formation surrounding the subacute distal fibular diaphyseal fracture. Subacute, healing nondisplaced fracture of the medial malleolus with increased surrounding callus formation. Subacute, mildly displaced fracture of the posterior malleolus with increased callus formation. Mild lateral subluxation of the talus with respect to the tibial plafond. No dislocation. Interval removal of the fibular plate and screw construct, as well as the medial malleolar screw. Interval tibiotalar joint fusion with anterior plate and screw construct. No definite bridging bone. There is loosening and slight backing out of both talar head screws. Unchanged midfoot osteoarthritis. Osteopenia. Diffuse soft tissue swelling about the ankle. IMPRESSION: 1. New subacute transverse fracture of the lateral malleolus. 2. Healing medial and posterior malleolar fractures. Healing distal fibular diaphyseal fracture. 3. Interval tibiotalar joint fusion with anterior plate and screw construct. Loosening of both talar head screws. Electronically Signed   By: Titus Dubin M.D.   On: 03/22/2019 16:22   Ct Head Wo Contrast  Result Date: 03/22/2019 CLINICAL DATA:  Vertigo episodic peripheral EXAM: CT HEAD WITHOUT CONTRAST TECHNIQUE: Contiguous axial images were obtained from the base of the skull through the vertex without intravenous contrast. COMPARISON:  CT head 03/11/2018 FINDINGS: Brain: Mild atrophy. Patchy white matter hypodensity bilaterally appears stable from the prior study. No acute infarct, hemorrhage, mass. Vascular: Negative for hyperdense vessel Skull: Negative Sinuses/Orbits: Paranasal sinuses clear. Right mastoid effusion. Bilateral cataract surgery. Other: None IMPRESSION: No acute abnormality no change from the prior study. Stable atrophy and chronic microvascular ischemic change in the  white matter. Electronically Signed   By: Franchot Gallo M.D.   On: 03/22/2019  16:20   Ct Ankle Right Wo Contrast  Result Date: 03/23/2019 CLINICAL DATA:  Recent right ankle fusion now with right fibular fracture. Assess tibiotalar fusion for displacement EXAM: CT OF THE RIGHT ANKLE WITHOUT CONTRAST TECHNIQUE: Multidetector CT imaging of the right ankle was performed according to the standard protocol. Multiplanar CT image reconstructions were also generated. COMPARISON:  Plain films 03/22/2019. FINDINGS: There has been interval removal of distal fibular hardware since plain films from 02/07/2019. Removal of the medial malleolar screw also noted. Placement of a plate and screw fixation device across the tibiotalar joint anteriorly. There is displaced fractures through the medial malleolus and posterior malleolus of the tibia. Healing distal fibular shaft fracture with callus formation. Fracture line remains visible. There is a new fracture in the distal fibula which may be at the site of prior distal screw. There is irregularity noted within the bone in this area suggesting the possibility of infection. There is large lucency noted in the distal tibia at the site of prior syndesmotic screw. This appears larger within the tibia than expected after screw removal and may be related to infection. Lucency in the talar dome noted the screw which enters from the tibia into the talar dome concerning for infection. IMPRESSION: Removal of prior hardware. Displaced medial malleolar and posterior malleolar fractures noted. Healing distal fibular shaft fracture. Fracture line remains evident with exuberant callus. There is a new fracture in the distal fibula, likely at the site of the previous distal most screw. The bones irregular in this area. This could be related to infection. Lucency in the distal tibia at the site of previous syndesmotic screw which appears larger than expected following screw removal and may be related to infection. Lucency around the talar dome screw concerning for infection.  Electronically Signed   By: Rolm Baptise M.D.   On: 03/23/2019 00:58   Xr Shoulder Right  Result Date: 04/05/2019 Radiographs of right shoulder show some degenerative changes. No acute finding.   Assessment/Plan:    Dizziness- CT brain-no acute abnormalities, stable at atrophy and chronic microvascular ischemic changes; felt secondary to dehydration and orthostatic hypotension which patient is on Midodrine 7.5 mg 3 times dailye does not really report dizziness this appears to have improved blood pressures appear to have stabilized  Hypertension SNF- continue Lasix 40 mg twice daily  Hyperlipidemia SNF-not stated as uncontrolled; continue Lipitor 40 mg daily  Diabetic neuropathy of bilateral hands SNF-patient's Neurontin was increased from 300 mg nightly to 300 mg twice daily which is the limit based on her creatinine  Right ankle fracture-new subacute lateral malleolar fracture status post ORIF followed by ankle fusion- surgical collapse and nonunion of ankle fracture-CT showed loosening of the hardware due to Charcot ankle; Ortho recommends patella tendon right double upright AFO brace she apparently has been upgraded to full weightbearing as tolerated.  She will need continued PT and OT    COPD-again she has seen pulmonology as of yesterday and some medications were changes she is now on Trelegy continues also on levalbuterol and Incruse Ellipta as well as Atrovent nebulizers 3 times daily and Mucinex 600 mg twice daily- she will have follow-up with pulmonology as needed again will defer to pulmonology in regards to her medications since she just saw pulmonology yesterday.  History of pulmonary hypertension she is on Selexipeg   History of CHF she is followed by cardiology and continues on  Lasix 40 mg twice daily with potassium supplementation if possible would like home health to draw a BMP on Friday, October 2 notify primary care provider of results her labs most recently  appear to be stable in this regards.  History of chronic kidney disease again this appears to be stable with a creatinine of 1.2 on lab done on September 22 again will try to have this updated by home health and primary care provider notified of results but this appears to be stable.  History of coronary artery disease she continues on atorvastatin 40 mg p.o. daily she has been relatively asymptomatic during her stay here.  History of anemia this is shown stability with a hemoglobin most recently of 9.9 she is on iron.  #History of lung cancer status post partial right lower lobe segmentectomy she is followed by oncology Dr. [name]  History of diabetes she is on Levemir as well as Trulicity which she receives weekly in addition to Humalog sliding scale --at this point appears to be stable blood sugar this morning was 163-previous  was 201 at this point will monitor would be hesitant to change any medication before discharge--  History of depression she continues on Zoloft this appears to be stable as well   Patient is being discharged with the following durable medical equipment: She will need supplemental oxygen  Patient has been advised to f/u with their PCP in 1-2 weeks to bring them up to date on their rehab stay.  Social services at facility was responsible for arranging this appointment.  Pt was provided with a 30 day supply of prescriptions for medications and refills must be obtained from their PCP.  For controlled substances, a more limited supply may be provided adequate until PCP appointment only.  Future labs/tests needed: Home health to draw BMP on Friday, October 2 notify PCP of results   [id number] note greater than 30 minutes spent on this discharge summary-greater than 50% of time spent coordinating a plan of care for numerous diagnoses  Granville Lewis, PA-C

## 2019-04-14 ENCOUNTER — Telehealth: Payer: Self-pay | Admitting: Family

## 2019-04-14 MED ORDER — SERTRALINE HCL 50 MG PO TABS: 75.0000 mg | ORAL_TABLET | Freq: Every day | ORAL | 0 refills | Status: AC

## 2019-04-14 MED ORDER — IPRATROPIUM BROMIDE 0.02 % IN SOLN: 0.5000 mg | Freq: Three times a day (TID) | RESPIRATORY_TRACT | 0 refills | Status: AC

## 2019-04-14 MED ORDER — AZELASTINE HCL 0.1 % NA SOLN: 1.0000 | Freq: Two times a day (BID) | NASAL | 0 refills | Status: AC

## 2019-04-14 MED ORDER — INSULIN DETEMIR 100 UNIT/ML ~~LOC~~ SOLN: 20.0000 [IU] | Freq: Every day | SUBCUTANEOUS | 0 refills | Status: AC

## 2019-04-14 MED ORDER — ALBUTEROL SULFATE (2.5 MG/3ML) 0.083% IN NEBU: 2.5000 mg | INHALATION_SOLUTION | RESPIRATORY_TRACT | 0 refills | Status: AC | PRN

## 2019-04-14 MED ORDER — MIDODRINE HCL 2.5 MG PO TABS: 7.5000 mg | ORAL_TABLET | Freq: Three times a day (TID) | ORAL | 0 refills | Status: AC

## 2019-04-14 MED ORDER — TRELEGY ELLIPTA 100-62.5-25 MCG/INH IN AEPB: 1.0000 | INHALATION_SPRAY | Freq: Every day | RESPIRATORY_TRACT | 0 refills | Status: AC

## 2019-04-14 MED ORDER — LEVALBUTEROL HCL 0.63 MG/3ML IN NEBU: 0.6300 mg | INHALATION_SOLUTION | Freq: Three times a day (TID) | RESPIRATORY_TRACT | 0 refills | Status: AC

## 2019-04-14 MED ORDER — POTASSIUM CHLORIDE CRYS ER 20 MEQ PO TBCR: 20.0000 meq | EXTENDED_RELEASE_TABLET | Freq: Every day | ORAL | 0 refills | Status: AC

## 2019-04-14 MED ORDER — GABAPENTIN 300 MG PO CAPS: 300.0000 mg | ORAL_CAPSULE | Freq: Two times a day (BID) | ORAL | 0 refills | Status: AC

## 2019-04-14 MED ORDER — ATORVASTATIN CALCIUM 40 MG PO TABS: 40.0000 mg | ORAL_TABLET | Freq: Every day | ORAL | 0 refills | Status: AC

## 2019-04-14 MED ORDER — SELEXIPAG 400 MCG PO TABS: 400.0000 ug | ORAL_TABLET | Freq: Two times a day (BID) | ORAL | 0 refills | Status: AC

## 2019-04-14 MED ORDER — POLYSACCHARIDE IRON COMPLEX 150 MG PO CAPS: 150.0000 mg | ORAL_CAPSULE | Freq: Two times a day (BID) | ORAL | 0 refills | Status: AC

## 2019-04-14 MED ORDER — TRULICITY 1.5 MG/0.5ML ~~LOC~~ SOAJ: 1.5000 mg | SUBCUTANEOUS | 0 refills | Status: AC

## 2019-04-14 MED ORDER — INCRUSE ELLIPTA 62.5 MCG/INH IN AEPB: 1.0000 | INHALATION_SPRAY | Freq: Every day | RESPIRATORY_TRACT | 0 refills | Status: AC

## 2019-04-14 MED ORDER — OPSUMIT 10 MG PO TABS: 10.0000 mg | ORAL_TABLET | Freq: Every day | ORAL | 0 refills | Status: AC

## 2019-04-14 MED ORDER — FUROSEMIDE 40 MG PO TABS: 40.0000 mg | ORAL_TABLET | Freq: Two times a day (BID) | ORAL | 0 refills | Status: AC

## 2019-04-14 MED ORDER — INSULIN LISPRO 100 UNIT/ML ~~LOC~~ SOLN: 2.0000 [IU] | SUBCUTANEOUS | 0 refills | Status: AC

## 2019-04-14 NOTE — Telephone Encounter (Signed)
Patient called stating that Junie Panning was going to refer her to someone in our practice if her pain was not any better when she returned for her f/u.  She stated that she does not think that she can wait that long and wanted to know whom she was going to refer her to.  CB#[phone].  Thank you.

## 2019-04-15 NOTE — Telephone Encounter (Signed)
I called pt and she states that she is having neck pain and it is now radiating down both arms R>L and that she is having decreased ROM and weakness. "arms feel heavy" pt appt moved up to 04/19/19 with erin. At last visit had advised will check C spine xrays and go from there. Pt voiced understanding and will call with any questions.

## 2019-04-16 ENCOUNTER — Other Ambulatory Visit (HOSPITAL_COMMUNITY): Payer: Self-pay | Admitting: Cardiology

## 2019-04-19 ENCOUNTER — Ambulatory Visit: Payer: Medicare Other | Admitting: Family

## 2019-04-26 ENCOUNTER — Encounter: Payer: Self-pay | Admitting: Orthopedic Surgery

## 2019-04-26 ENCOUNTER — Ambulatory Visit (INDEPENDENT_AMBULATORY_CARE_PROVIDER_SITE_OTHER): Payer: Medicare Other | Admitting: Orthopedic Surgery

## 2019-04-26 ENCOUNTER — Other Ambulatory Visit: Payer: Self-pay

## 2019-04-26 VITALS — Ht 61.0 in | Wt 166.0 lb

## 2019-04-26 DIAGNOSIS — M542 Cervicalgia: Secondary | ICD-10-CM

## 2019-04-26 DIAGNOSIS — S82891P Other fracture of right lower leg, subsequent encounter for closed fracture with malunion: Secondary | ICD-10-CM | POA: Diagnosis not present

## 2019-04-26 DIAGNOSIS — G8929 Other chronic pain: Secondary | ICD-10-CM

## 2019-04-26 DIAGNOSIS — M25511 Pain in right shoulder: Secondary | ICD-10-CM | POA: Diagnosis not present

## 2019-04-26 MED ORDER — PREDNISONE 10 MG PO TABS: 10.0000 mg | ORAL_TABLET | Freq: Every day | ORAL | 0 refills | Status: AC

## 2019-04-26 NOTE — Progress Notes (Signed)
Office Visit Note   Patient: Amy Shepard           Date of Birth: [birth year]           MRN: [id number] Visit Date: 04/26/2019              Requested by: Hennie Duos, MD [address] [place],  [place] [address] PCP: Hennie Duos, MD  Chief Complaint  Patient presents with  . Right Shoulder - Follow-up  . Right Ankle - Follow-up      HPI: Patient is a 73 year old woman who presents for 2 separate issues.  #1 rotator cuff pathology right shoulder she states she did have some temporary relief with a steroid injection.  #2 status post right ankle fusion secondary to complications from internal fixation.  Patient states that her foot is swollen and is having pain from the swelling.  Assessment & Plan: Visit Diagnoses:  1. Chronic right shoulder pain   2. Closed fracture of right ankle with malunion, subsequent encounter     Plan: Discussed elevation and compression to help with the swelling.  We will give her a low-dose prednisone 10 mg to see if this will help with her neck and shoulder symptoms.  Patient is not a good surgical candidate for the shoulder.  Recommended that she check her sugars discussed that the prednisone will increase her glucose level.  Follow-Up Instructions: Return in about 4 weeks (around 05/24/2019).   Ortho Exam  Patient is alert, oriented, no adenopathy, well-dressed, normal affect, patient has shortness of breath at rest she is on nasal cannula FiO2 she is in a wheelchair. Examination patient's right foot is plantigrade the surgical incisions are well-healed she has pitting edema involving the foot ankle and leg but no open ulcers.  No cellulitis.  No tenderness to palpation.  Examination of shoulder she has pain with passive range of motion pain with internal and external rotation.  Patient has global pain around her neck with range of motion.  No radicular symptoms in either upper extremity.  Imaging: No results found. No images  are attached to the encounter.  Labs: Lab Results  Component Value Date   HGBA1C 6.9 (H) 02/25/2019   HGBA1C 6.9 (H) 02/11/2019   HGBA1C 7.2 (H) 04/11/2018   ESRSEDRATE 55 (H) 03/23/2019   ESRSEDRATE 83 (H) 04/13/2018   ESRSEDRATE 37 (H) 08/13/2016   CRP <0.8 03/23/2019   CRP 0.3 (L) 09/11/2016   REPTSTATUS 03/23/2019 FINAL 03/22/2019   GRAMSTAIN  02/11/2019    RARE WBC PRESENT, PREDOMINANTLY PMN NO ORGANISMS SEEN    CULT (A) 03/22/2019    30,000 COLONIES/mL GROUP B STREP(S.AGALACTIAE)ISOLATED TESTING AGAINST S. AGALACTIAE NOT ROUTINELY PERFORMED DUE TO PREDICTABILITY OF AMP/PEN/VAN SUSCEPTIBILITY. Performed at [facility], [place] [address], [place], [place] [address]    LABORGA ESCHERICHIA COLI (A) 03/11/2018     Lab Results  Component Value Date   ALBUMIN 3.1 (L) 03/23/2019   ALBUMIN 3.2 (L) 03/22/2019   ALBUMIN 3.1 (L) 03/02/2019    Lab Results  Component Value Date   MG 2.5 (H) 03/11/2019   MG 2.5 (H) 03/10/2019   MG 2.4 03/09/2019   No results found for: VD25OH  No results found for: PREALBUMIN CBC EXTENDED Latest Ref Rng & Units 03/23/2019 03/22/2019 03/03/2019  WBC 4.0 - 10.5 K/uL 5.5 5.8 -  RBC 3.87 - 5.11 MIL/uL 3.48(L) 3.60(L) -  HGB 12.0 - 15.0 g/dL 9.3(L) 9.6(L) 8.8(L)  HCT 36.0 -  46.0 % 30.3(L) 31.4(L) 26.0(L)  PLT 150 - 400 K/uL 288 290 -  NEUTROABS 1.7 - 7.7 K/uL - 4.3 -  LYMPHSABS 0.7 - 4.0 K/uL - 0.7 -     Body mass index is 31.37 kg/m.  Orders:  No orders of the defined types were placed in this encounter.  Meds ordered this encounter  Medications  . predniSONE (DELTASONE) 10 MG tablet    Sig: Take 1 tablet (10 mg total) by mouth daily with breakfast.    Dispense:  30 tablet    Refill:  0     Procedures: No procedures performed  Clinical Data: No additional findings.  ROS:  All other systems negative, except as noted in the HPI. Review of Systems  Objective: Vital Signs: Ht _0  (1.549 m)   Wt 166 lb (75.3 kg)   BMI  31.37 kg/m   Specialty Comments:  No specialty comments available.  PMFS History: Patient Active Problem List   Diagnosis Date Noted  . Diabetic neuropathy associated with type 2 diabetes mellitus ([place]) 04/02/2019  . Dizziness 03/22/2019  . Orthostatic hypotension 03/22/2019  . Hypoxemia   . Palliative care by specialist   . DNR (do not resuscitate) discussion   . Right upper lobe pulmonary nodule 02/13/2019  . Closed right ankle fracture, with malunion, subsequent encounter 02/11/2019  . Charcot ankle, right   . Pain from implanted hardware   . SOB (shortness of breath)   . Acute on chronic systolic (congestive) heart failure ([place])   . 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 Diabetes mellitus type 2 in obese ([place])   . Supplemental oxygen dependent   . Trimalleolar fracture of ankle, closed, right, sequela 12/01/2018  . Hypoxia   . Acute on chronic anemia   . Closed dislocation of right talus   . Acute on chronic postoperative respiratory failure ([place])   . Postprocedural hypotension   . Trimalleolar fracture of ankle, closed, right, initial encounter   . Syndesmotic disruption of ankle, right, initial encounter   . Ankle fracture 11/22/2018  . Pain in joint, ankle and foot 04/10/2018  . Closed left fibular fracture 04/10/2018  . CKD (chronic kidney disease), stage IV ([place]) 04/09/2018  . Near syncope 04/09/2018  . Dyspnea   . Bronchitis, acute 03/11/2018  . Fatigue 03/11/2018  . Anemia due to stage 4 chronic kidney disease ([place]) 03/11/2018  . Pulmonary fibrosis ([place]) 03/11/2018  . Pulmonary hypertension, unspecified ([place]) 03/11/2018  . Anemia 03/11/2018  . Lisfranc dislocation, left, subsequent encounter 06/08/2017  . Chronic respiratory failure with hypoxia ([place]) 06/03/2017  . Falls 06/03/2017  . Hypokalemia 06/03/2017  . Sepsis secondary to UTI ([place]) 06/02/2017  . Dog bite 05/02/2017  . CHF  exacerbation ([place]) 05/02/2017  . Acute diastolic CHF (congestive heart failure) ([place]) 05/02/2017  . Chronic diastolic CHF (congestive heart failure) ([place]) 04/02/2017  . RVF (right ventricular failure) ([place]) 04/02/2017  . Acute on chronic respiratory failure ([place]) 03/10/2017  . Chronic pain 03/10/2017  . Lactic acidosis 02/10/2017  . Acute respiratory failure with hypoxia ([place]) 02/09/2017  . Acute on chronic diastolic CHF (congestive heart failure) ([place]) 06/23/2016  . HTN (hypertension) 06/23/2016  . Type 2 diabetes mellitus with complication, with long-term current use of insulin ([place]) 03/11/2016  . Depression 03/11/2016  . Chronic obstructive pulmonary disease ([place]) 03/10/2016  . S/P lumbar spinal fusion 02/22/2016  . Coronary artery disease involving native  coronary artery 07/12/2014  . Pain in the chest   . Malignant neoplasm of lower lobe of right lung ([place]) 05/25/2014  . Lung cancer ([place]) 09/01/2012   Past Medical History:  Diagnosis Date  . Anemia   . Anxiety   . Arthritis   . CAD (coronary artery disease)    a. Prior cath 2015 showed 40% prox AD, 50-50% mLAD, otherwise calcification but no obstruction in LCx/RCA.. Medical management recommended. b. 2017: low-risk NST.  [name] Chronic diastolic CHF (congestive heart failure) ([place])   . Chronic respiratory failure ([place])   . CKD (chronic kidney disease), stage III   . Complication of anesthesia    oxygen level dropped in last surgery 11/2018  . COPD (chronic obstructive pulmonary disease) ([place])   . Cor pulmonale ([name])    a. felt due to advanced COPD and noncompliance with O2.  . Depression   . Diabetes mellitus    [name]    dx  2008  . Fracture    left foot  . Hx of seasonal allergies   . Hypercholesteremia   . Hypertension   . Lung cancer ([place])   . On home oxygen therapy    "2.5L; 24/7" (03/10/2017)  . Pericardial effusion    a. small-moderate in 07/2016.  [name] Pneumonia   . Pulmonary hypertension ([place])   . UTI (urinary  tract infection)     Family History  Problem Relation Age of Onset  . Diabetes Father   . Heart failure Father   . Cancer Sister   . Diabetes Brother   . Heart failure Brother     Past Surgical History:  Procedure Laterality Date  . ANKLE FUSION Right 02/11/2019   Procedure: RIGHT ANKLE FUSION;  Surgeon: Newt Minion, MD;  Location: [facility];  Service: Orthopedics;  Laterality: Right;  . APPLICATION OF WOUND VAC Right 11/23/2018   Procedure: Application Of Wound Vac;  Surgeon: Newt Minion, MD;  Location: [facility];  Service: Orthopedics;  Laterality: Right;  . [facility] in [place]     bilateral cataracts  . HARDWARE REMOVAL Right 02/11/2019   Procedure: REMOVAL DEEP HARDWARE RIGHT ANKLE;  Surgeon: Newt Minion, MD;  Location: [facility];  Service: Orthopedics;  Laterality: Right;  . LEFT HEART CATHETERIZATION WITH CORONARY ANGIOGRAM N/A 07/12/2014   Procedure: LEFT HEART CATHETERIZATION WITH CORONARY ANGIOGRAM;  Surgeon: [name], MD;  Location: [facility] CATH LAB;  Service: Cardiovascular;  Laterality: N/A;  . MAXIMUM ACCESS (MAS)POSTERIOR LUMBAR INTERBODY FUSION (PLIF) 2 LEVEL N/A 02/22/2016   Procedure: Lumbar three-four - Lumbar four-five  MAXIMUM ACCESS SURGERY  POSTERIOR LUMBAR INTERBODY FUSION;  Surgeon: [name], MD;  Location: [facility]UR[facility];  Service: Neurosurgery;  Laterality: N/A;  . ORIF ANKLE FRACTURE Right 11/23/2018   Procedure: OPEN REDUCTION INTERNAL FIXATION (ORIF) RIGHT ANKLE FRACTURE;  Surgeon: Newt Minion, MD;  Location: [facility];  Service: Orthopedics;  Laterality: Right;  . ORIF TOE FRACTURE Left 06/12/2017   Procedure: OPEN REDUCTION INTERNAL FIXATION (ORIF) BASE 1ST METATARSAL (TOE) FRACTURE;  Surgeon: Newt Minion, MD;  Location: [facility];  Service: Orthopedics;  Laterality: Left;  . RIGHT HEART CATH N/A 08/06/2017   Procedure: RIGHT HEART CATH;  Surgeon: Larey Dresser, MD;  Location: [facility]  LAB;  Service: Cardiovascular;  Laterality: N/A;  . RIGHT HEART CATH N/A 03/03/2019   Procedure: RIGHT HEART CATH;  Surgeon: Jolaine Artist, MD;  Location: [facility] LAB;  Service: Cardiovascular;  Laterality: N/A;  . THORACOTOMY Right 2010   lower  . TONSILLECTOMY     Social History   Occupational History  . Not on file  Tobacco Use  . Smoking status: Former Smoker    Packs/day: 1.00    Years: 30.00    Pack years: 30.00    Quit date: 07/14/2006    Years since quitting: 12.7  . Smokeless tobacco: Never Used  Substance and Sexual Activity  . Alcohol use: No  . Drug use: No  . Sexual activity: Not on file

## 2019-05-04 ENCOUNTER — Encounter (HOSPITAL_COMMUNITY): Payer: Medicare Other

## 2019-05-15 DEATH — deceased

## 2019-05-24 ENCOUNTER — Ambulatory Visit: Payer: Medicare Other | Admitting: Orthopedic Surgery

## 2019-05-27 IMAGING — CR DG CHEST 2V
2 series · 2 of 2 positions shown · non-contrast
Comparison: 03/13/2017

CLINICAL DATA: Chest pain pain and shortness of breath for 1 week

EXAM:
CHEST  2 VIEW

[chest lat]
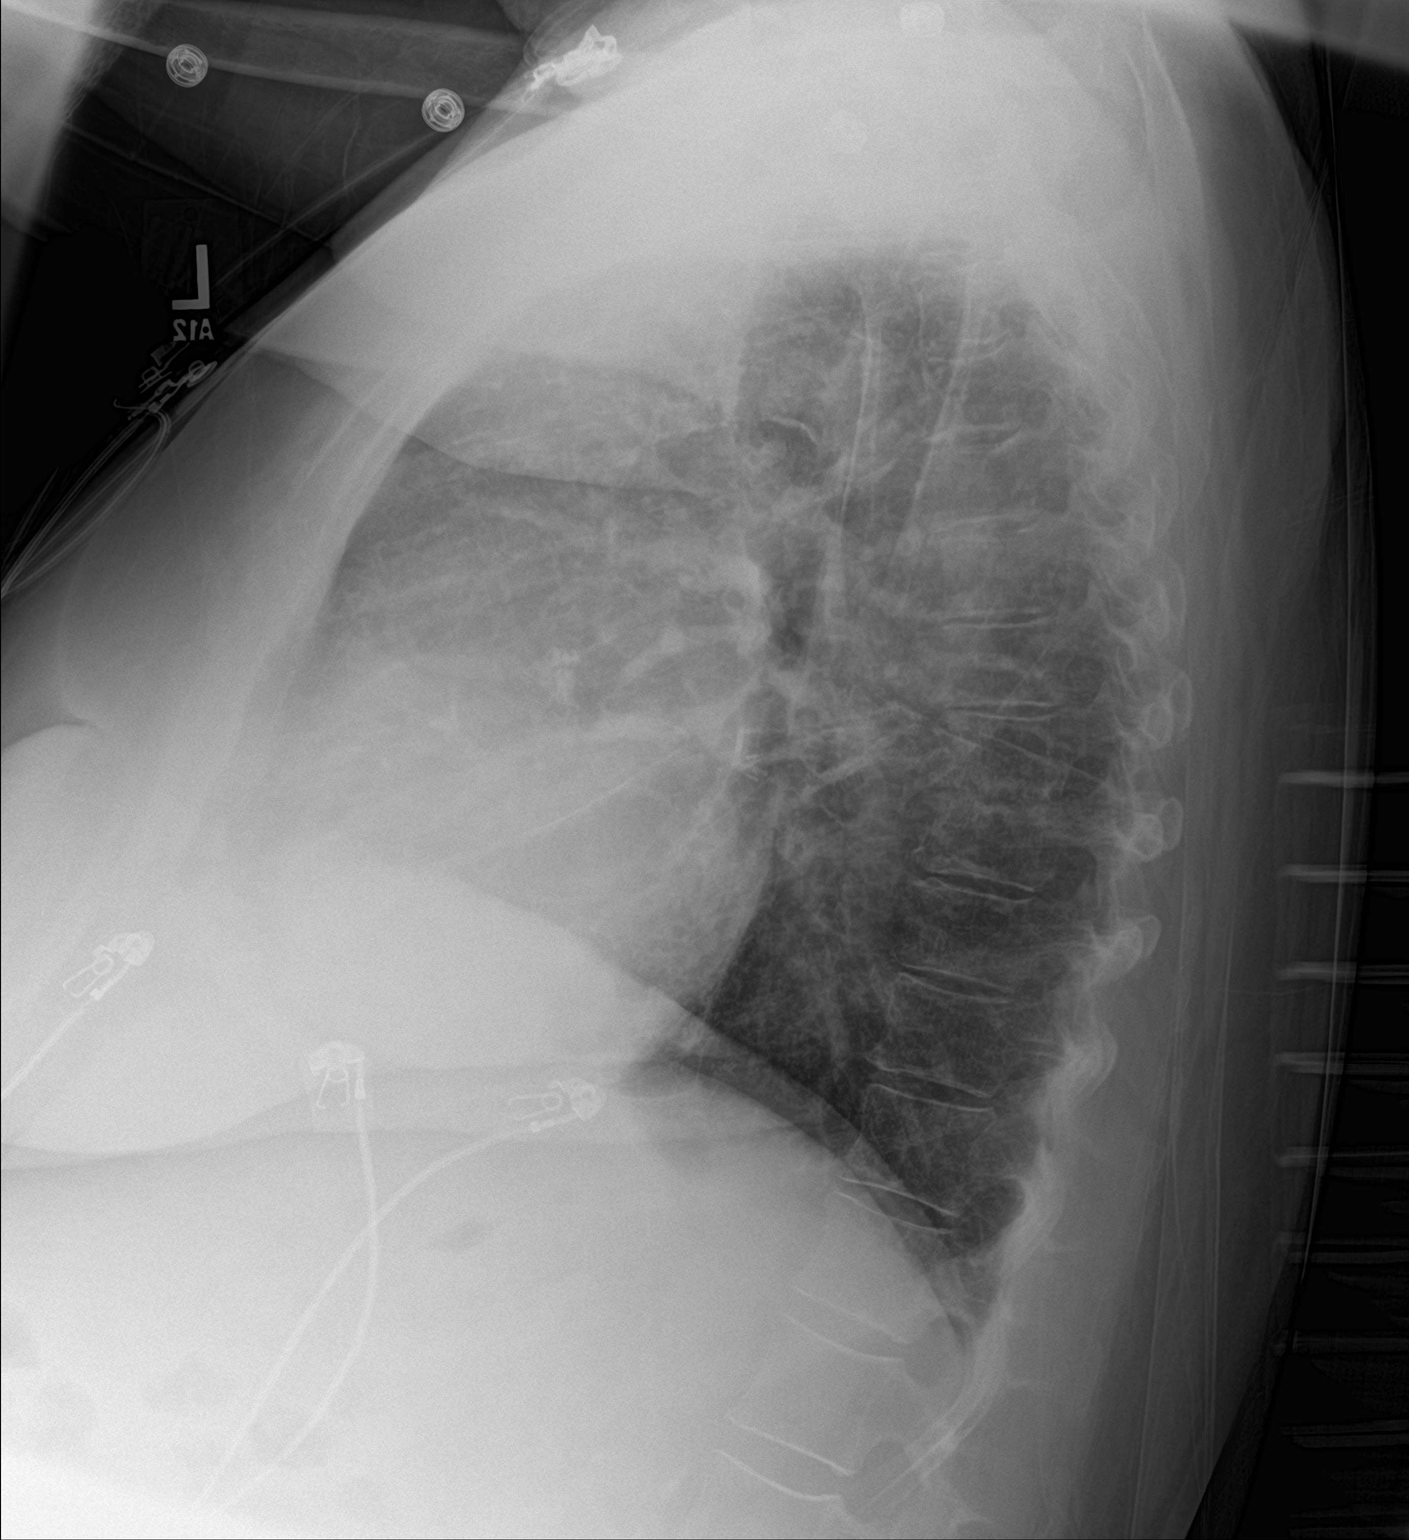

[chest ap]
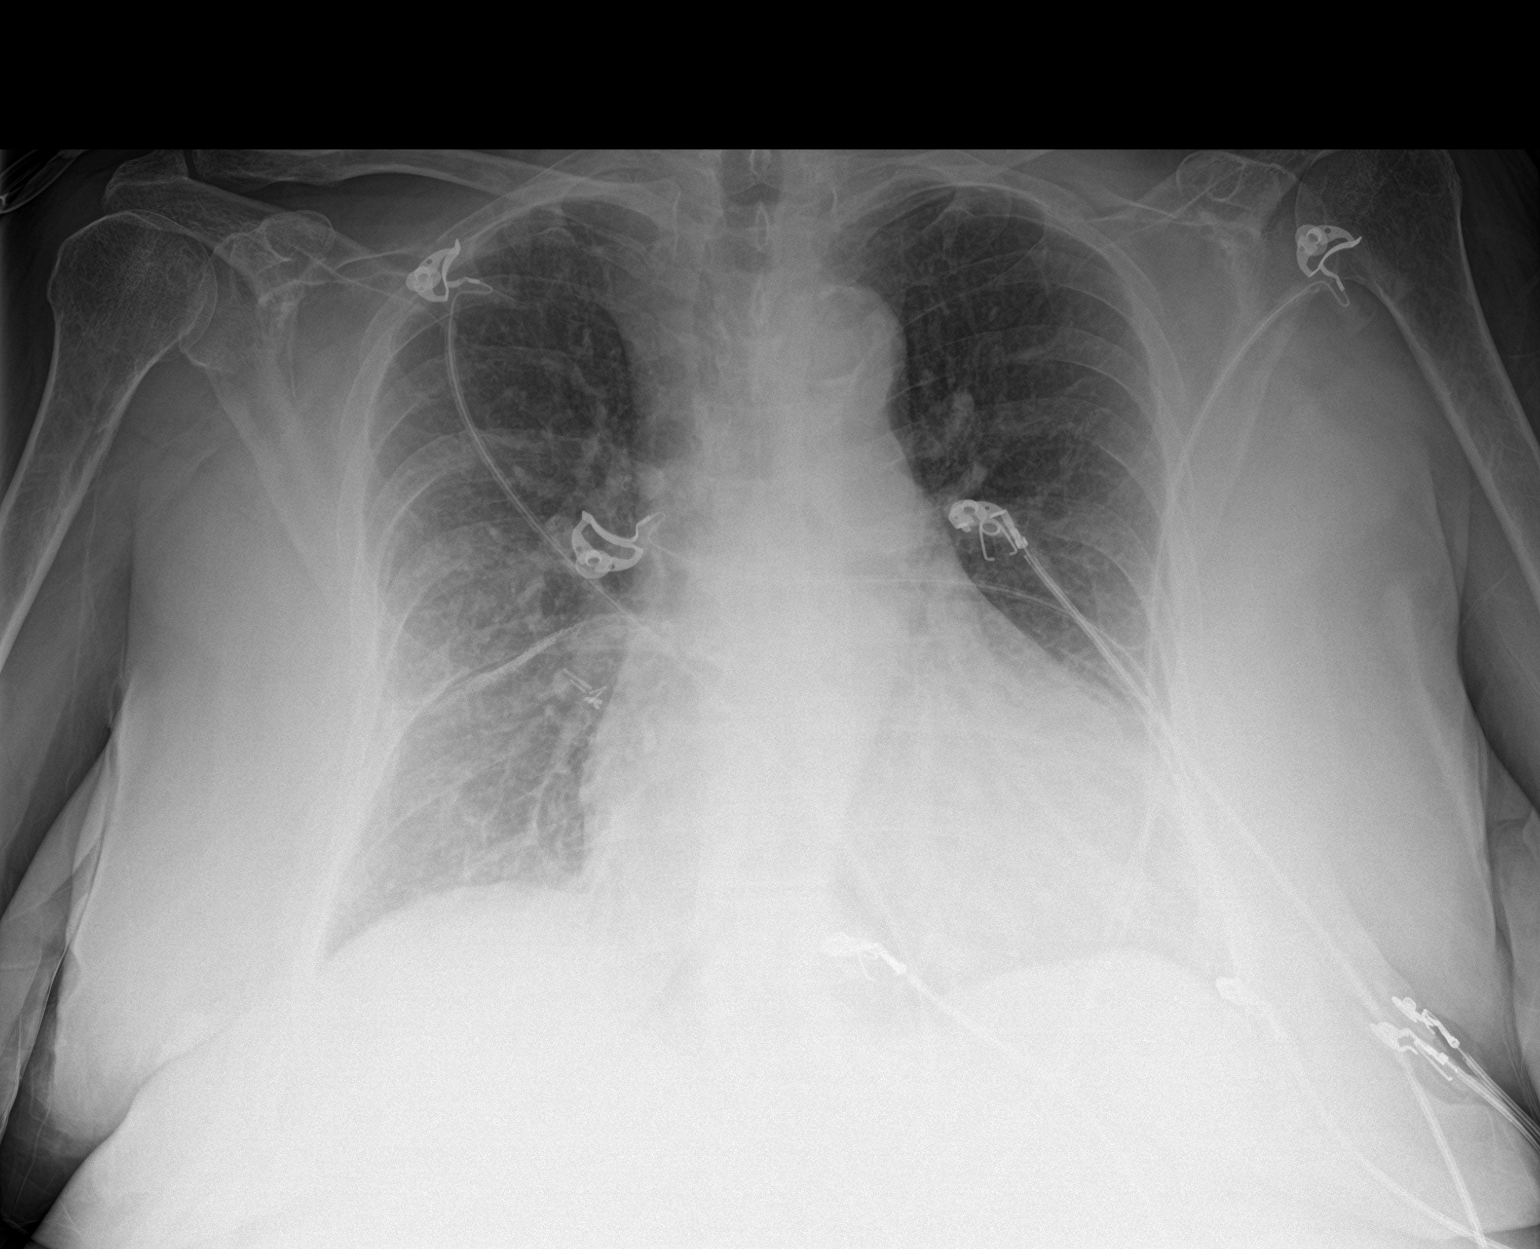

[2 of 2 positions shown; findings below may reference images not displayed]

FINDINGS: Cardiac shadow is mildly enlarged. Aortic calcifications are again
seen. The lungs are well aerated bilaterally without focal
infiltrate or sizable effusion. Postsurgical changes are noted in
the right infrahilar region. No bony abnormality is seen.
IMPRESSION: No acute abnormality noted.

## 2019-05-28 IMAGING — CR DG CHEST 2V
2 series · 2 of 2 positions shown · non-contrast
Comparison: 05/02/2017 and prior exams

CLINICAL DATA: CHF exacerbation.

EXAM:
CHEST  2 VIEW

[chest lat]
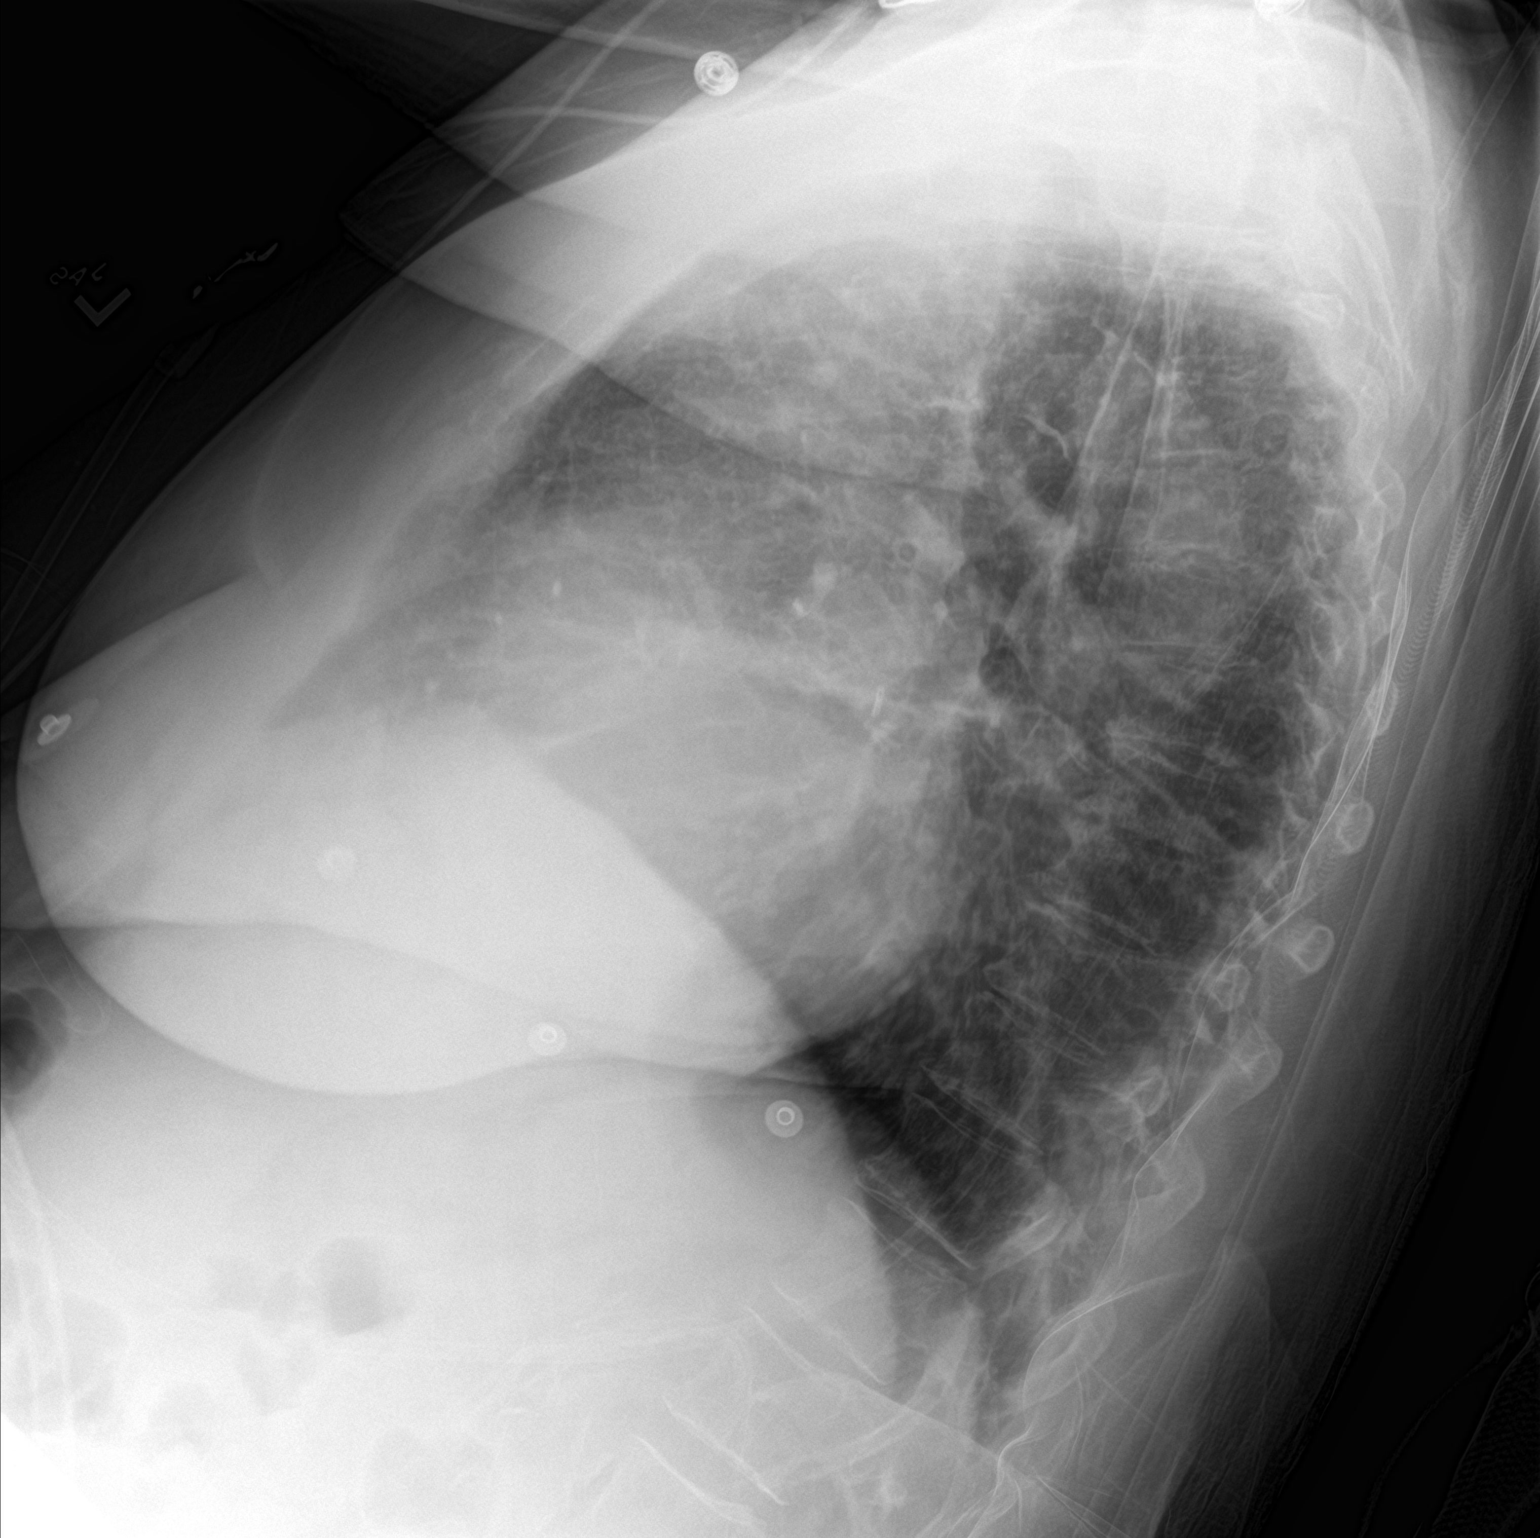

[chest ap]
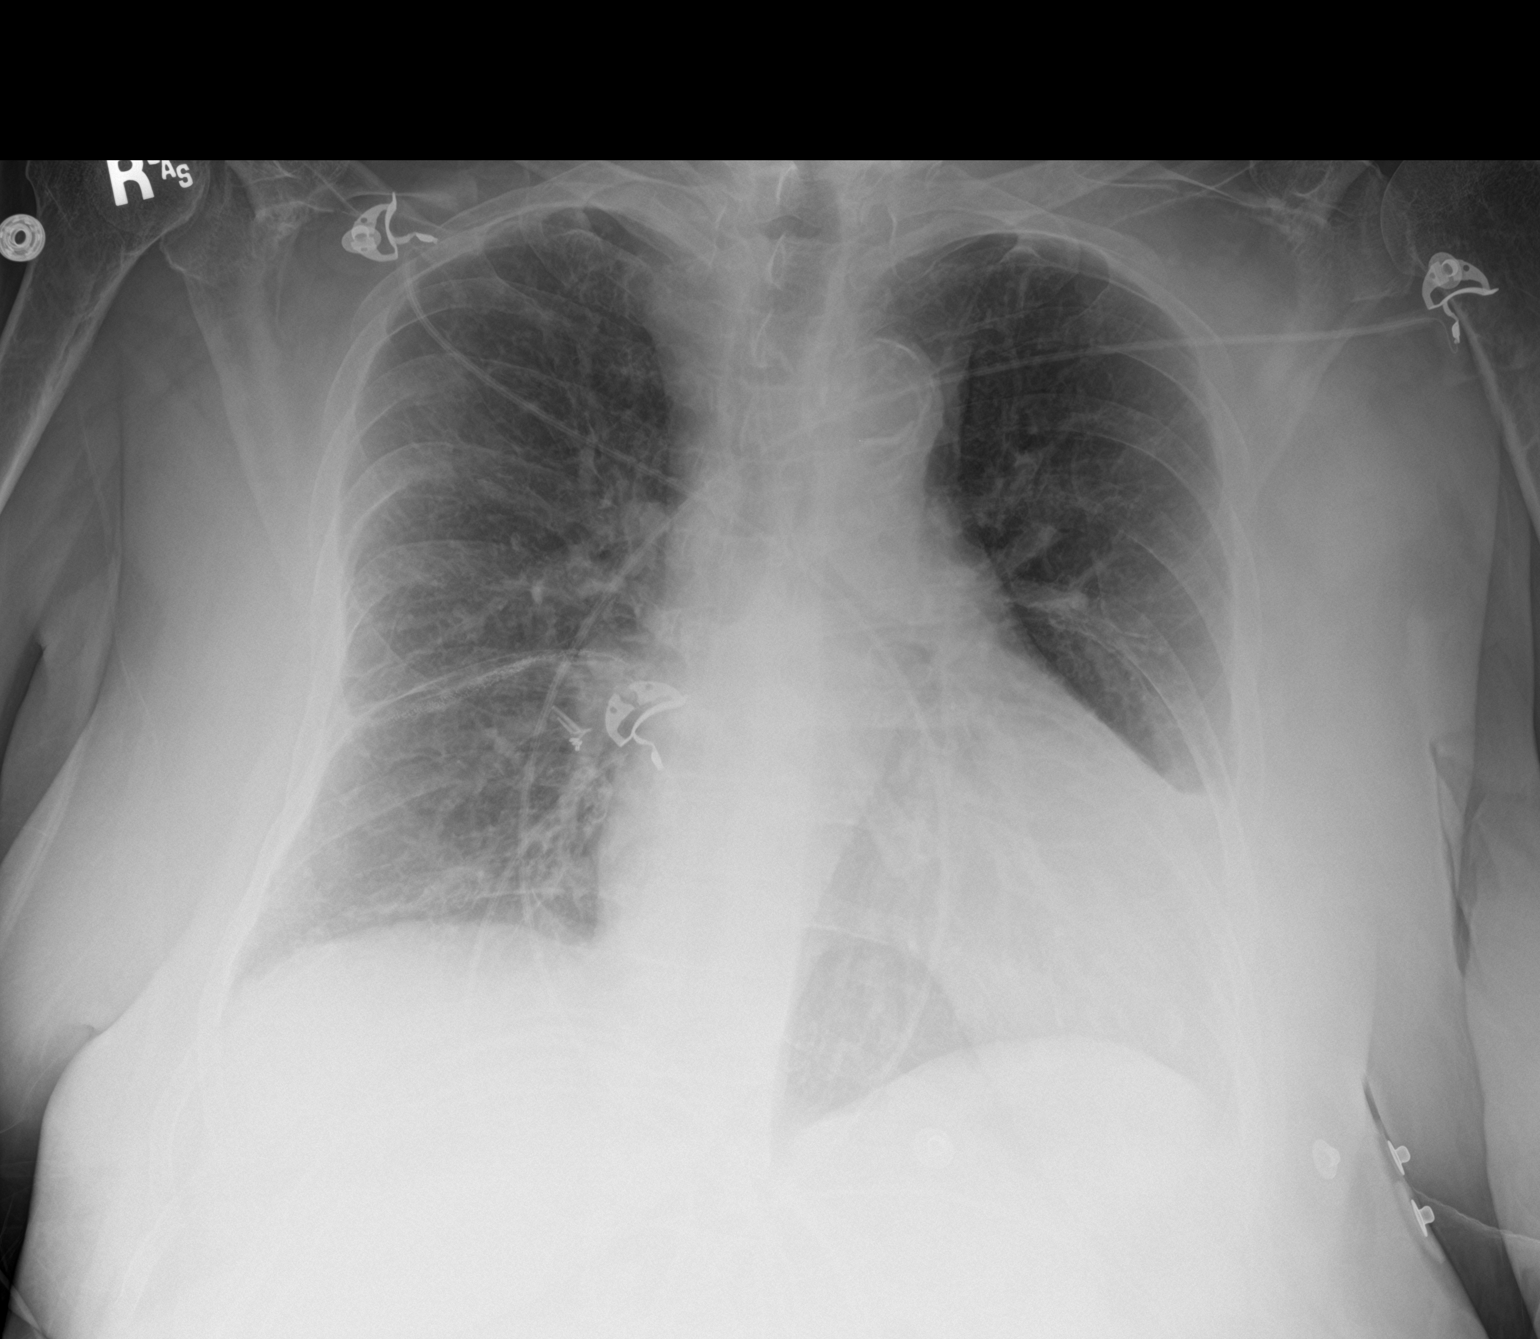

[2 of 2 positions shown; findings below may reference images not displayed]

FINDINGS: Cardiomegaly and mild pulmonary vascular congestion again noted.

Mild lingular atelectasis now identified.

There is no evidence of airspace disease, pleural effusion or
pneumothorax.
IMPRESSION: New mild lingular atelectasis without other significant change.
Cardiomegaly and mild pulmonary vascular congestion again noted.

## 2019-07-22 ENCOUNTER — Other Ambulatory Visit: Payer: Medicare Other

## 2019-07-25 ENCOUNTER — Ambulatory Visit: Payer: Medicare Other | Admitting: Internal Medicine

## 2019-10-11 NOTE — Telephone Encounter (Signed)
disregard

## 2020-04-04 IMAGING — DX DG CHEST 2V
2 series · 2 of 2 positions shown · non-contrast
Comparison: Chest radiograph January 29, 2018

CLINICAL DATA: Chest discomfort and nausea. History of lung cancer
and COPD, CHF.

EXAM:
CHEST - 2 VIEW

[chest lat]
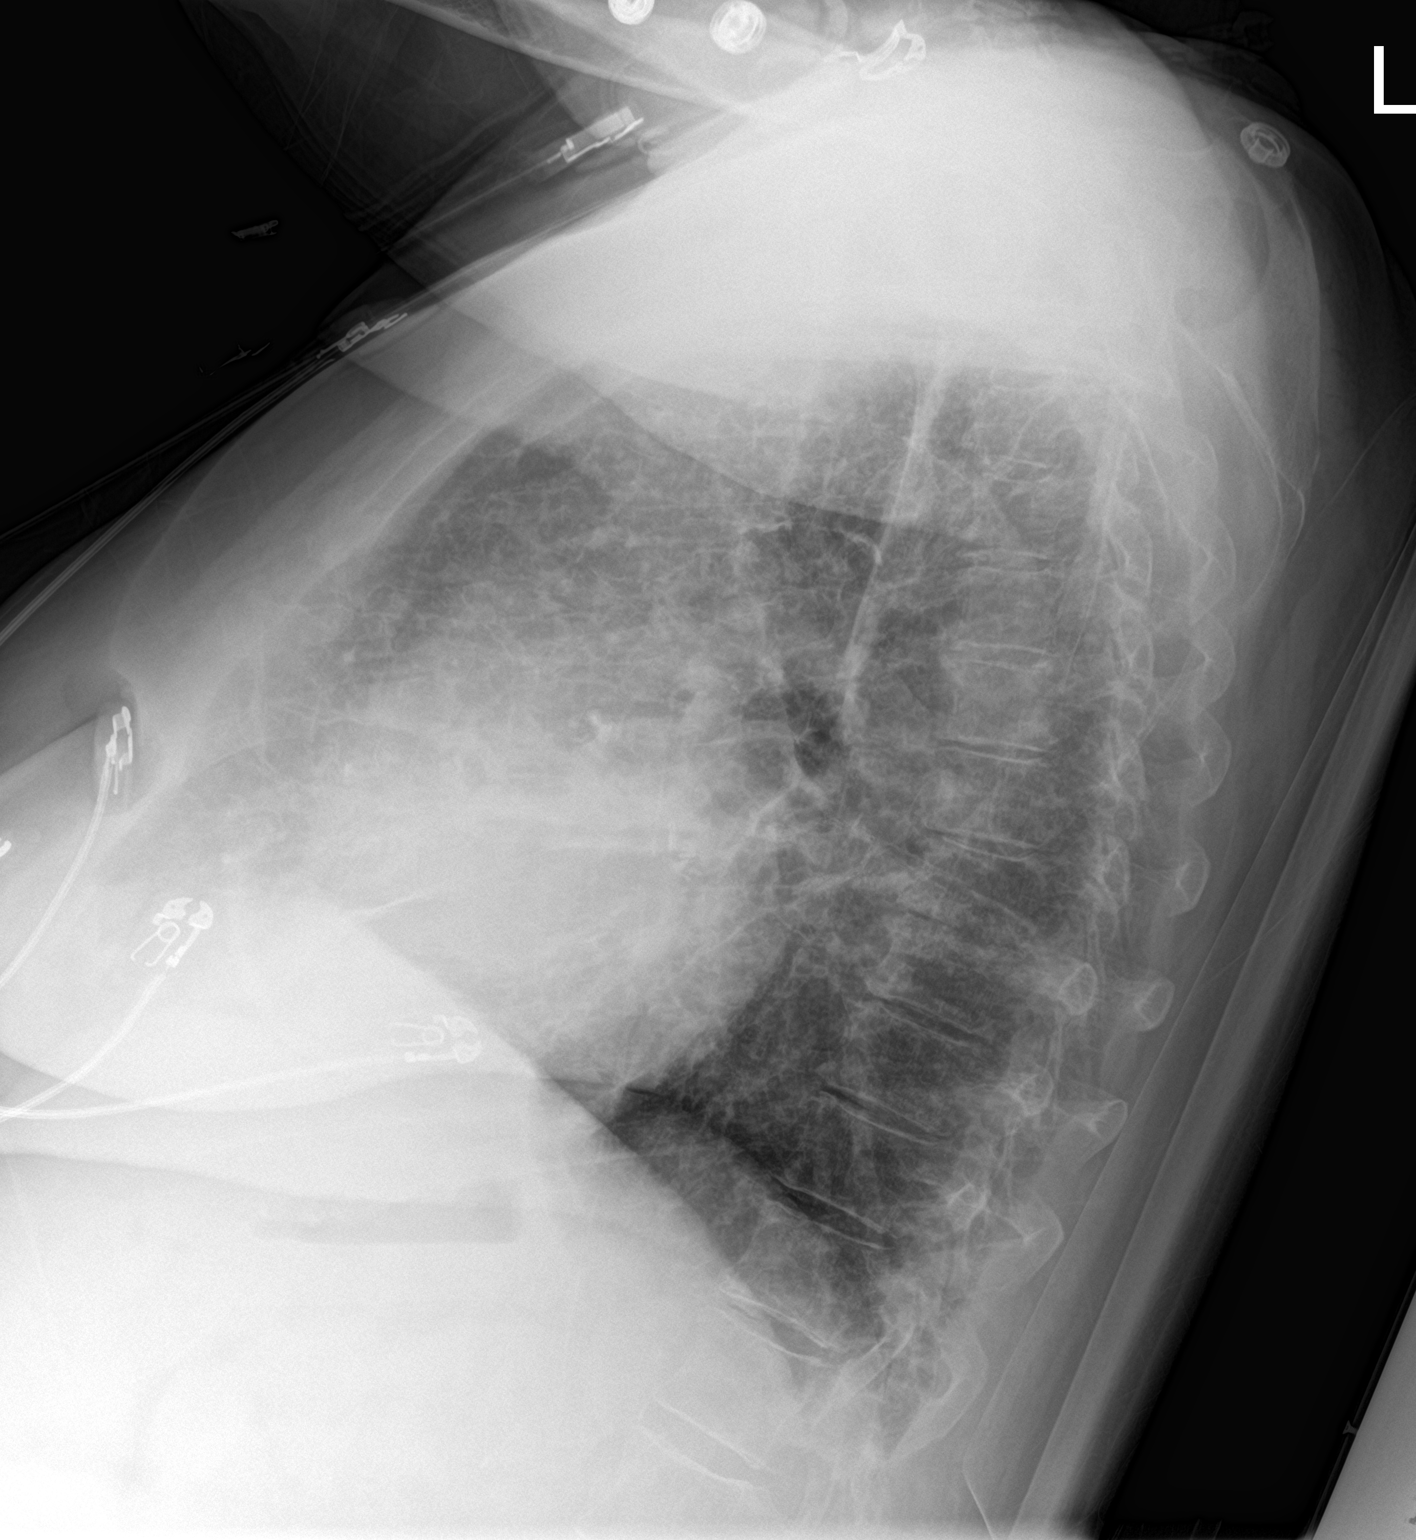

[chest ap]
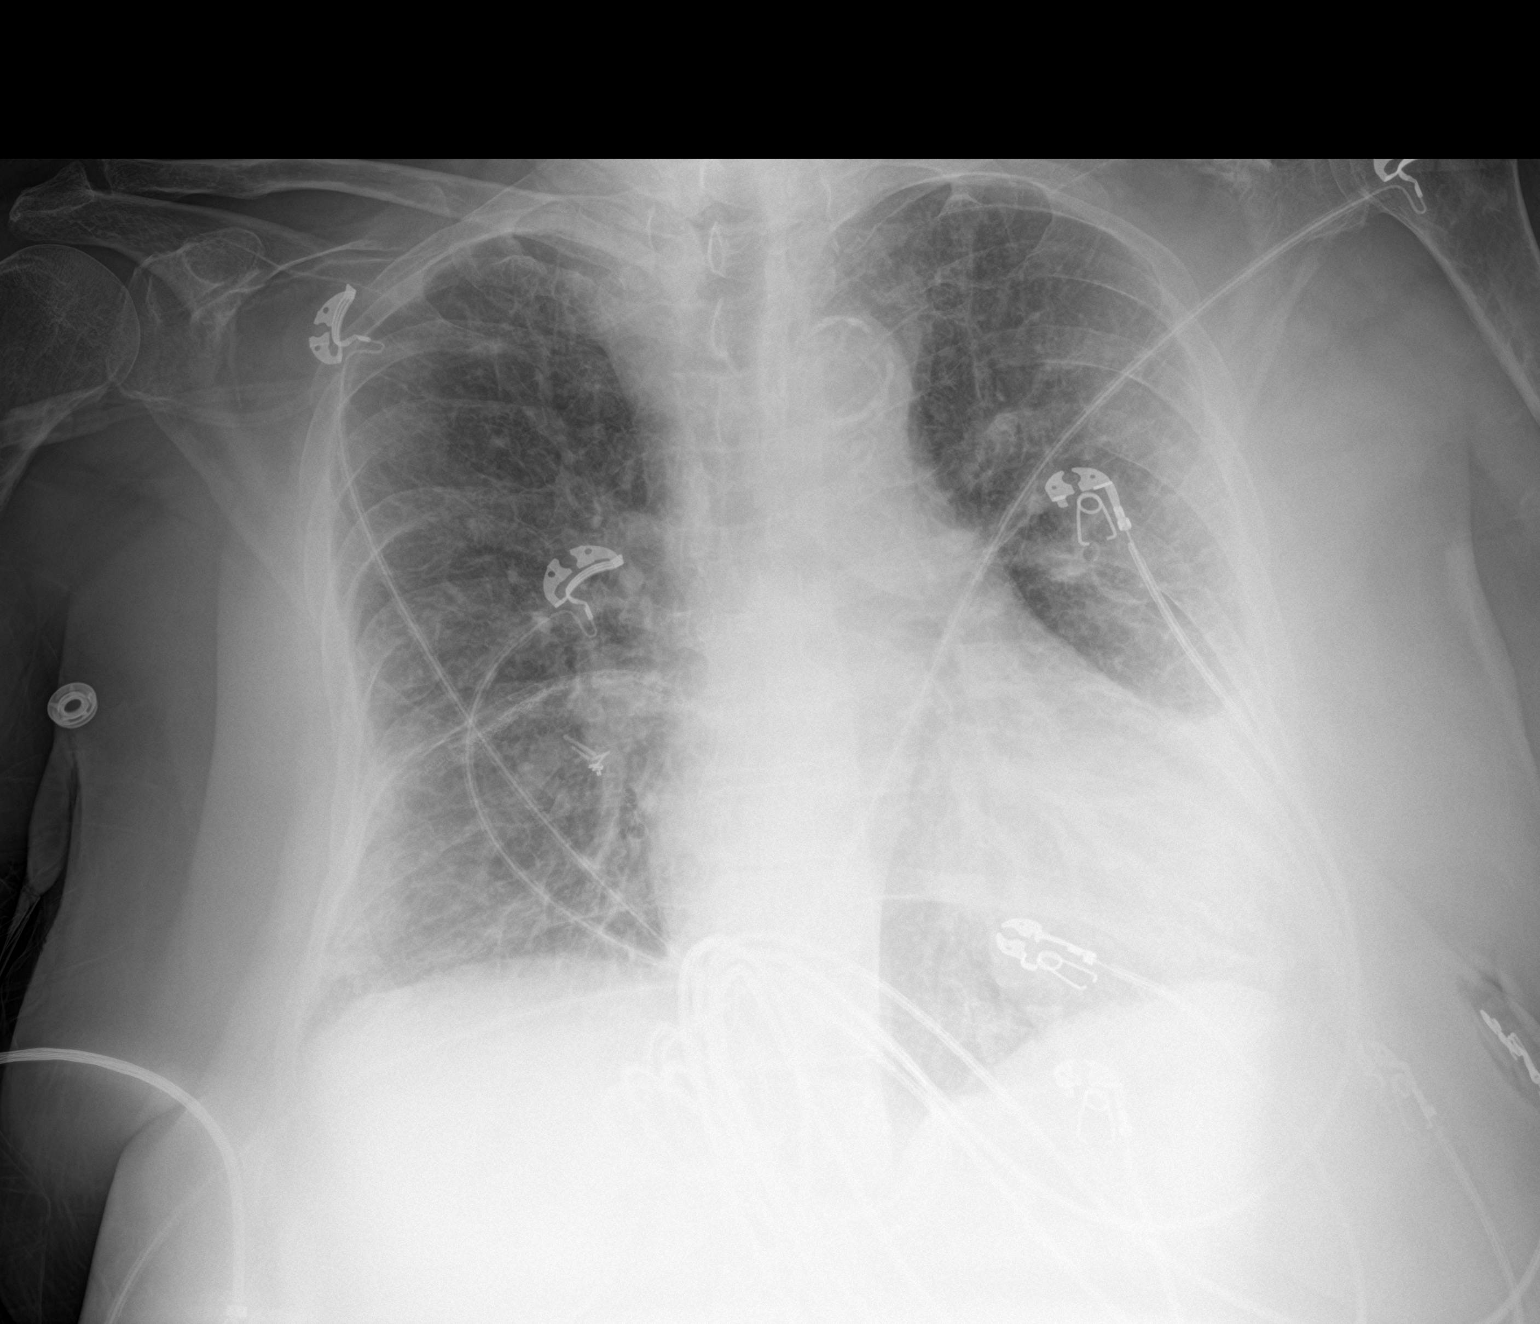

[2 of 2 positions shown; findings below may reference images not displayed]

FINDINGS: Stable cardiomegaly. Calcified aortic knob. Chronic interstitial
changes. Bibasilar scarring. No pleural effusion or focal
consolidation. Increased lung volumes and flattened hemidiaphragms.
No pneumothorax. Soft tissue planes and included osseous structures
are non suspicious. Osteopenia.
IMPRESSION: 1. COPD and bibasilar scarring.
2. Stable cardiomegaly.
3.  Aortic Atherosclerosis ([id number]).

## 2021-06-27 ENCOUNTER — Other Ambulatory Visit (HOSPITAL_COMMUNITY): Payer: Self-pay
